# Patient Record
Sex: Female | Born: 1945
Health system: Southern US, Community
[De-identification: ages and names within clinical notes are randomized; demographics above are authoritative.]

## PROBLEM LIST (undated history)

## (undated) DIAGNOSIS — E785 Hyperlipidemia, unspecified: Secondary | ICD-10-CM

## (undated) DIAGNOSIS — E039 Hypothyroidism, unspecified: Secondary | ICD-10-CM

## (undated) DIAGNOSIS — I272 Pulmonary hypertension, unspecified: Secondary | ICD-10-CM

## (undated) DIAGNOSIS — R51 Headache: Secondary | ICD-10-CM

## (undated) DIAGNOSIS — I482 Chronic atrial fibrillation, unspecified: Secondary | ICD-10-CM

## (undated) DIAGNOSIS — R0600 Dyspnea, unspecified: Secondary | ICD-10-CM

## (undated) DIAGNOSIS — I10 Essential (primary) hypertension: Secondary | ICD-10-CM

## (undated) DIAGNOSIS — I499 Cardiac arrhythmia, unspecified: Secondary | ICD-10-CM

## (undated) DIAGNOSIS — R519 Headache, unspecified: Secondary | ICD-10-CM

## (undated) DIAGNOSIS — Z923 Personal history of irradiation: Secondary | ICD-10-CM

## (undated) DIAGNOSIS — C801 Malignant (primary) neoplasm, unspecified: Secondary | ICD-10-CM

## (undated) DIAGNOSIS — R252 Cramp and spasm: Secondary | ICD-10-CM

## (undated) DIAGNOSIS — R0609 Other forms of dyspnea: Secondary | ICD-10-CM

## (undated) DIAGNOSIS — Z9221 Personal history of antineoplastic chemotherapy: Secondary | ICD-10-CM

## (undated) DIAGNOSIS — E669 Obesity, unspecified: Secondary | ICD-10-CM

## (undated) DIAGNOSIS — I890 Lymphedema, not elsewhere classified: Secondary | ICD-10-CM

## (undated) DIAGNOSIS — N189 Chronic kidney disease, unspecified: Secondary | ICD-10-CM

## (undated) DIAGNOSIS — K219 Gastro-esophageal reflux disease without esophagitis: Secondary | ICD-10-CM

## (undated) DIAGNOSIS — M1990 Unspecified osteoarthritis, unspecified site: Secondary | ICD-10-CM

## (undated) HISTORY — DX: Hyperlipidemia, unspecified: E78.5

## (undated) HISTORY — PX: EYE SURGERY: SHX253

## (undated) HISTORY — DX: Dyspnea, unspecified: R06.00

## (undated) HISTORY — DX: Other forms of dyspnea: R06.09

## (undated) HISTORY — DX: Unspecified osteoarthritis, unspecified site: M19.90

## (undated) HISTORY — DX: Chronic atrial fibrillation, unspecified: I48.20

## (undated) HISTORY — DX: Lymphedema, not elsewhere classified: I89.0

## (undated) HISTORY — DX: Essential (primary) hypertension: I10

## (undated) HISTORY — PX: APPENDECTOMY: SHX54

## (undated) HISTORY — PX: COLONOSCOPY: SHX5424

## (undated) HISTORY — DX: Obesity, unspecified: E66.9

## (undated) HISTORY — DX: Pulmonary hypertension, unspecified: I27.20

---

## 2015-12-07 ENCOUNTER — Ambulatory Visit (INDEPENDENT_AMBULATORY_CARE_PROVIDER_SITE_OTHER): Payer: Medicare Other | Admitting: Sports Medicine

## 2015-12-07 ENCOUNTER — Encounter: Payer: Self-pay | Admitting: Sports Medicine

## 2015-12-07 ENCOUNTER — Ambulatory Visit: Payer: Medicare Other

## 2015-12-07 VITALS — BP 136/88 | HR 78 | Resp 17

## 2015-12-07 DIAGNOSIS — I739 Peripheral vascular disease, unspecified: Secondary | ICD-10-CM

## 2015-12-07 DIAGNOSIS — B351 Tinea unguium: Secondary | ICD-10-CM | POA: Diagnosis not present

## 2015-12-07 DIAGNOSIS — M79671 Pain in right foot: Secondary | ICD-10-CM | POA: Diagnosis not present

## 2015-12-07 DIAGNOSIS — M79672 Pain in left foot: Secondary | ICD-10-CM | POA: Diagnosis not present

## 2015-12-07 NOTE — Progress Notes (Signed)
Patient ID: Katelyn Jackson, female   DOB: [birth year], 70 y.o.   MRN: [id number] Subjective: Marianela Mirenda is a 70 y.o. female patient seen today in office with complaint of painful thickened and elongated toenails; unable to trim. States that sometimes the big toenails grow-in at the corners and get debris there that is hard to get out; reports that she has relocated from [facility] and was seeing a podiatrist there, Dr. Kirke Shaggy who was trimming her nails for her. Patient denies history of known Diabetes, Neuropathy, or Vascular disease. Admits to cardiovascular disease on warfarin and kidney disease which causes swelling in her legs. Patient has no other pedal complaints at this time.   Patient is assisted by Son at this visit.  There are no active problems to display for this patient.  No current outpatient prescriptions on file prior to visit.   No current facility-administered medications on file prior to visit.   Not on File   Objective: Physical Exam  General: Well developed, nourished, no acute distress, awake, alert and oriented x 3  Vascular: Dorsalis pedis artery 1/4 bilateral, Posterior tibial artery 0/4 bilateral, skin temperature warm to warm proximal to distal bilateral lower extremities, + mild varicosities and trace edema to both feet and legs, no pedal hair present bilateral.  Neurological: Gross sensation present via light touch bilateral. Protective intact with SWMF, Vibratory slightly diminished using tuning fork bilateral.  Dermatological: Skin is warm, dry, and supple bilateral, Nails 1-10 are tender, long, thick, and discolored with mild subungal debris, no webspace macerations present bilateral, no open lesions present bilateral, no callus/corns/hyperkeratotic tissue present bilateral. No signs of infection bilateral.  Musculoskeletal: Pes planus foot type noted bilateral. Muscular strength within normal limits without pain on range of motion. No pain with calf  compression bilateral.  Assessment and Plan:  Problem List Items Addressed This Visit    None    Visit Diagnoses    Foot pain, bilateral    -  Primary    Dermatophytosis of nail        PVD (peripheral vascular disease) ([place])          -Examined patient.  -Discussed treatment options for painful mycotic nails. -Mechanically debrided and reduced mycotic nails with sterile nail nipper and dremel nail file without incident. -Recommend elevation of lower legs when sitting to assist with edema control -Patient to return in 3 months for follow up evaluation or sooner if symptoms worsen.  Landis Martins, DPM

## 2016-03-07 ENCOUNTER — Encounter: Payer: Self-pay | Admitting: Sports Medicine

## 2016-03-07 ENCOUNTER — Ambulatory Visit (INDEPENDENT_AMBULATORY_CARE_PROVIDER_SITE_OTHER): Payer: Medicare HMO | Admitting: Sports Medicine

## 2016-03-07 DIAGNOSIS — M79671 Pain in right foot: Secondary | ICD-10-CM

## 2016-03-07 DIAGNOSIS — M79672 Pain in left foot: Secondary | ICD-10-CM

## 2016-03-07 DIAGNOSIS — B351 Tinea unguium: Secondary | ICD-10-CM

## 2016-03-07 DIAGNOSIS — I739 Peripheral vascular disease, unspecified: Secondary | ICD-10-CM

## 2016-03-07 NOTE — Patient Instructions (Signed)
Tea tree oil for nails  Son, to soak once daily with epsom salt and dress big toe with neosporin if does not improve make appointment to be seen

## 2016-03-07 NOTE — Progress Notes (Signed)
Patient ID: Katelyn Jackson, female   DOB: 1945/12/19, 70 y.o.   MRN: [id number]   Subjective: Katelyn Jackson is a 70 y.o. female patient seen today in office with complaint of painful thickened and elongated toenails; unable to trim. Denies any changes with medical history since last visit; Admits she had a right ankle sprain that is now better. Patient has no other pedal complaints at this time.   Patient is assisted by Son at this visit.  There are no active problems to display for this patient.  No current outpatient prescriptions on file prior to visit.   No current facility-administered medications on file prior to visit.   Not on File   Objective: Physical Exam  General: Well developed, nourished, no acute distress, awake, alert and oriented x 3  Vascular: Dorsalis pedis artery 1/4 bilateral, Posterior tibial artery 0/4 bilateral, skin temperature warm to warm proximal to distal bilateral lower extremities, + mild varicosities and +1 edema to both feet and legs, no pedal hair present bilateral.  Neurological: Gross sensation present via light touch bilateral. Protective intact with SWMF, Vibratory slightly diminished using tuning fork bilateral.  Dermatological: Skin is warm, dry, and supple bilateral, Nails 1-10 are tender, long, thick, and discolored with mild subungal debris, no webspace macerations present bilateral, no open lesions present bilateral, no callus/corns/hyperkeratotic tissue present bilateral. No signs of infection bilateral.  Musculoskeletal:No pain to right ankle. Pes planus foot type noted bilateral. Muscular strength within normal limits without pain on range of motion. No pain with calf compression bilateral.  Assessment and Plan:  Problem List Items Addressed This Visit    None    Visit Diagnoses    Foot pain, bilateral    -  Primary    Dermatophytosis of nail        PVD (peripheral vascular disease) ([facility])        Relevant Medications    furosemide (LASIX) 40 MG tablet    lovastatin (MEVACOR) 20 MG tablet    valsartan-hydrochlorothiazide (DIOVAN-HCT) 320-25 MG tablet    warfarin (COUMADIN) 5 MG tablet      -Examined patient.  -Discussed treatment options for painful mycotic nails. -Mechanically debrided and reduced mycotic nails with sterile nail nipper and dremel nail file without incident. -Recommend elevation of lower legs when sitting to assist with edema control -Recommend good supportive shoes and walking aids to prevent re-sprain  -Patient to return in 3 months for follow up evaluation or sooner if symptoms worsen.  Landis Martins, DPM

## 2016-03-15 ENCOUNTER — Ambulatory Visit: Payer: Self-pay | Admitting: Cardiovascular Disease

## 2016-03-17 ENCOUNTER — Ambulatory Visit: Payer: Self-pay | Admitting: Cardiovascular Disease

## 2016-03-29 ENCOUNTER — Ambulatory Visit: Payer: Self-pay | Admitting: Cardiovascular Disease

## 2016-03-30 ENCOUNTER — Telehealth: Payer: Self-pay | Admitting: Cardiovascular Disease

## 2016-03-30 NOTE — Telephone Encounter (Signed)
Received records from [facility] for appointment on 04/05/16 with Dr Gwenlyn Found.  Records given to [facility] (medical records) for Dr Kennon Holter schedule on 04/05/16. lp

## 2016-04-05 ENCOUNTER — Encounter: Payer: Self-pay | Admitting: Cardiovascular Disease

## 2016-04-05 ENCOUNTER — Ambulatory Visit (INDEPENDENT_AMBULATORY_CARE_PROVIDER_SITE_OTHER): Payer: Medicare HMO | Admitting: Cardiovascular Disease

## 2016-04-05 VITALS — BP 164/90 | HR 94 | Ht 61.0 in | Wt 267.2 lb

## 2016-04-05 DIAGNOSIS — E785 Hyperlipidemia, unspecified: Secondary | ICD-10-CM | POA: Diagnosis not present

## 2016-04-05 DIAGNOSIS — I890 Lymphedema, not elsewhere classified: Secondary | ICD-10-CM

## 2016-04-05 DIAGNOSIS — I272 Other secondary pulmonary hypertension: Secondary | ICD-10-CM | POA: Diagnosis not present

## 2016-04-05 DIAGNOSIS — I482 Chronic atrial fibrillation, unspecified: Secondary | ICD-10-CM | POA: Insufficient documentation

## 2016-04-05 DIAGNOSIS — I10 Essential (primary) hypertension: Secondary | ICD-10-CM | POA: Diagnosis not present

## 2016-04-05 NOTE — Assessment & Plan Note (Signed)
History of chronic A. Fib rate controlled on Coumadin anticoagulation. 

## 2016-04-05 NOTE — Assessment & Plan Note (Signed)
History of moderate pulmonary hypertension by 2-D echocardiogram performed in [facility] 05/14/15. Her RV systolic pressure was 50 mmHg.

## 2016-04-05 NOTE — Progress Notes (Signed)
04/05/2016 Katelyn Jackson   1946/03/31  [id number]  Primary Physician Sandi Mariscal, MD Primary Cardiologist: Lorretta Harp MD Renae Gloss   HPI:  Ms Katelyn Jackson is a 70 year old severely overweight widowed Caucasian female mother of one son ([name]) with no grandchildren referred by Dr. Nancy Fetter for cardiovascular evaluation and to be established in our practice.She has a history of treated hypertension and hyperlipidemia as well as chronic A. Fib on Coumadin anticoagulation. She's never had a heart attack or stroke. She does get some dyspnea on exertion which is chronic. She also has chronic lymphedema.   Current Outpatient Prescriptions  Medication Sig Dispense Refill  . furosemide (LASIX) 40 MG tablet     . KLOR-CON M20 20 MEQ tablet     . latanoprost (XALATAN) 0.005 % ophthalmic solution     . levothyroxine (SYNTHROID, LEVOTHROID) 50 MCG tablet     . lovastatin (MEVACOR) 20 MG tablet     . omeprazole (PRILOSEC) 40 MG capsule     . polyethylene glycol powder (GLYCOLAX/MIRALAX) powder     . valsartan-hydrochlorothiazide (DIOVAN-HCT) 320-25 MG tablet     . VOLTAREN 1 % GEL     . warfarin (COUMADIN) 5 MG tablet      No current facility-administered medications for this visit.    Allergies  Allergen Reactions  . Penicillins Itching    Social History   Social History  . Marital Status: Widowed    Spouse Name: N/A  . Number of Children: N/A  . Years of Education: N/A   Occupational History  . Not on file.   Social History Main Topics  . Smoking status: Unknown If Ever Smoked  . Smokeless tobacco: Never Used  . Alcohol Use: Not on file  . Drug Use: Not on file  . Sexual Activity: Not on file   Other Topics Concern  . Not on file   Social History Narrative     Review of Systems: General: negative for chills, fever, night sweats or weight changes.  Cardiovascular: negative for chest pain, dyspnea on exertion, edema, orthopnea, palpitations, paroxysmal  nocturnal dyspnea or shortness of breath Dermatological: negative for rash Respiratory: negative for cough or wheezing Urologic: negative for hematuria Abdominal: negative for nausea, vomiting, diarrhea, bright red blood per rectum, melena, or hematemesis Neurologic: negative for visual changes, syncope, or dizziness All other systems reviewed and are otherwise negative except as noted above.    Blood pressure 164/90, pulse 94, height 5\' 1"  (1.549 m), weight 267 lb 3.2 oz (121.201 kg).  General appearance: alert and no distress Neck: no adenopathy, no carotid bruit, no JVD, supple, symmetrical, trachea midline and thyroid not enlarged, symmetric, no tenderness/mass/nodules Lungs: clear to auscultation bilaterally Heart: irregularly irregular rhythm Extremities: o 3+ pitting edema bilaterally  EKG atrial fibrillation with a ventricular response of 94, nonspecific ST-T wave changes with left anterior fascicular block. There was incomplete left bundle branch block as well. I personally reviewed this EKG  ASSESSMENT AND PLAN:   Essential hypertension History of hypertensive blood pressure measures 164/90. She is on valsartan and hydrochlorothiazide. She says her blood pressure usually much better than this at home. Continue current meds at current dosing  Hyperlipidemia History of hyperlipidemia on statin therapy followed by her PCP  Chronic atrial fibrillation ([place]) History of chronic A. Fib rate controlled on Coumadin anticoagulation.  Pulmonary hypertension ([place]) History of moderate pulmonary hypertension by 2-D echocardiogram performed in [facility] 05/14/15. Her RV systolic pressure was 50 mmHg.  Lorretta Harp MD FACP,FACC,FAHA, [facility] 04/05/2016 4:39 PM

## 2016-04-05 NOTE — Patient Instructions (Signed)
Medication Instructions:  Your physician recommends that you continue on your current medications as directed. Please refer to the Current Medication list given to you today.   Labwork: NONE  Testing/Procedures: NONE  Follow-Up: Your physician wants you to follow-up in: 12 MONTHS WITH DR BERRY. You will receive a reminder letter in the mail two months in advance. If you don't receive a letter, please call our office to schedule the follow-up appointment.   Any Other Special Instructions Will Be Listed Below (If Applicable).     If you need a refill on your cardiac medications before your next appointment, please call your pharmacy.   

## 2016-04-05 NOTE — Assessment & Plan Note (Signed)
History of hypertensive blood pressure measures 164/90. She is on valsartan and hydrochlorothiazide. She says her blood pressure usually much better than this at home. Continue current meds at current dosing

## 2016-04-05 NOTE — Assessment & Plan Note (Addendum)
History of hyperlipidemia on statin therapy followed by her PCP. 

## 2016-06-13 ENCOUNTER — Ambulatory Visit (INDEPENDENT_AMBULATORY_CARE_PROVIDER_SITE_OTHER): Payer: Medicare HMO | Admitting: Sports Medicine

## 2016-06-13 ENCOUNTER — Encounter: Payer: Self-pay | Admitting: Sports Medicine

## 2016-06-13 DIAGNOSIS — M2142 Flat foot [pes planus] (acquired), left foot: Secondary | ICD-10-CM

## 2016-06-13 DIAGNOSIS — M79671 Pain in right foot: Secondary | ICD-10-CM

## 2016-06-13 DIAGNOSIS — M79672 Pain in left foot: Secondary | ICD-10-CM

## 2016-06-13 DIAGNOSIS — I739 Peripheral vascular disease, unspecified: Secondary | ICD-10-CM

## 2016-06-13 DIAGNOSIS — M2141 Flat foot [pes planus] (acquired), right foot: Secondary | ICD-10-CM

## 2016-06-13 DIAGNOSIS — B351 Tinea unguium: Secondary | ICD-10-CM

## 2016-06-13 NOTE — Progress Notes (Signed)
Patient ID: Katelyn Jackson, female   DOB: 1946/09/15, 70 y.o.   MRN: [id number]   Subjective: Katelyn Jackson is a 70 y.o. female patient seen today in office with complaint of painful thickened and elongated toenails; unable to trim. Admits to pain x 2 weeks at left arch with walking sometimes. Denies any other problems. Patient has no other pedal complaints at this time.   Patient is assisted by Son at this visit.  Patient Active Problem List   Diagnosis Date Noted  . Essential hypertension 04/05/2016  . Hyperlipidemia 04/05/2016  . Chronic atrial fibrillation ([place]) 04/05/2016  . Pulmonary hypertension ([place]) 04/05/2016  . Lymphedema 04/05/2016   Current Outpatient Prescriptions on File Prior to Visit  Medication Sig Dispense Refill  . furosemide (LASIX) 40 MG tablet     . KLOR-CON M20 20 MEQ tablet     . latanoprost (XALATAN) 0.005 % ophthalmic solution     . levothyroxine (SYNTHROID, LEVOTHROID) 50 MCG tablet     . lovastatin (MEVACOR) 20 MG tablet     . omeprazole (PRILOSEC) 40 MG capsule     . polyethylene glycol powder (GLYCOLAX/MIRALAX) powder     . valsartan-hydrochlorothiazide (DIOVAN-HCT) 320-25 MG tablet     . VOLTAREN 1 % GEL     . warfarin (COUMADIN) 5 MG tablet      No current facility-administered medications on file prior to visit.    Allergies  Allergen Reactions  . Penicillins Itching     Objective: Physical Exam  General: Well developed, nourished, no acute distress, awake, alert and oriented x 3  Vascular: Dorsalis pedis artery 1/4 bilateral, Posterior tibial artery 0/4 bilateral, skin temperature warm to warm proximal to distal bilateral lower extremities, + mild varicosities and +1 edema to both feet and legs, no pedal hair present bilateral.  Neurological: Gross sensation present via light touch bilateral. Protective intact with SWMF, Vibratory slightly diminished using tuning fork bilateral.  Dermatological: Skin is warm, dry, and supple  bilateral, Nails 1-10 are tender, long, thick, and discolored with mild subungal debris, no webspace macerations present bilateral, no open lesions present bilateral, no callus/corns/hyperkeratotic tissue present bilateral. No signs of infection bilateral.  Musculoskeletal:Subjective pain to left arch at plantar TN joint, no reproducible pain,. Pes planus foot type noted bilateral. Muscular strength within normal limits without pain on range of motion. No pain with calf compression bilateral.  Assessment and Plan:  Problem List Items Addressed This Visit    None    Visit Diagnoses    Dermatophytosis of nail    -  Primary   PVD (peripheral vascular disease) ([place])       Foot pain, bilateral       Pes planus of both feet         -Examined patient.  -Discussed treatment options for painful mycotic nails. -Mechanically debrided and reduced mycotic nails with sterile nail nipper and dremel nail file without incident. -Recommend elevation of lower legs when sitting to assist with edema control -Recommend good supportive shoes and cushion especially at arch; gave padding to use to left arch -Recommend icing as needed and if area continues to be bothersome at TN joint on left will consider steroid shot next time.  -Patient to return in 3 months for follow up evaluation or sooner if symptoms worsen.  Landis Martins, DPM

## 2016-09-13 ENCOUNTER — Ambulatory Visit (INDEPENDENT_AMBULATORY_CARE_PROVIDER_SITE_OTHER): Payer: Medicare HMO | Admitting: Podiatry

## 2016-09-13 ENCOUNTER — Encounter: Payer: Self-pay | Admitting: Podiatry

## 2016-09-13 VITALS — Ht 61.0 in | Wt 267.0 lb

## 2016-09-13 DIAGNOSIS — M79671 Pain in right foot: Secondary | ICD-10-CM | POA: Diagnosis not present

## 2016-09-13 DIAGNOSIS — M79672 Pain in left foot: Secondary | ICD-10-CM

## 2016-09-13 DIAGNOSIS — B351 Tinea unguium: Secondary | ICD-10-CM | POA: Diagnosis not present

## 2016-09-13 DIAGNOSIS — I739 Peripheral vascular disease, unspecified: Secondary | ICD-10-CM

## 2016-09-13 NOTE — Progress Notes (Signed)
Complaint:  Visit Type: Patient returns to my office for continued preventative foot care services. Complaint: Patient states" my nails have grown long and thick and become painful to walk and wear shoes"  The patient presents for preventative foot care services. No changes to ROS  Podiatric Exam: Vascular: dorsalis pedis and posterior tibial pulses are palpable bilateral. Capillary return is immediate. Temperature gradient is WNL. Skin turgor WNL  Sensorium: Normal Semmes Weinstein monofilament test. Normal tactile sensation bilaterally. Nail Exam: Pt has thick disfigured discolored nails with subungual debris noted bilateral entire nail hallux through fifth toenails Ulcer Exam: There is no evidence of ulcer or pre-ulcerative changes or infection. Orthopedic Exam: Muscle tone and strength are WNL. No limitations in general ROM. No crepitus or effusions noted. Foot type and digits show no abnormalities. Bony prominences are unremarkable. Pes planus Skin: No Porokeratosis. No infection or ulcers  Diagnosis:  Onychomycosis, , Pain in right toe, pain in left toes  Treatment & Plan Procedures and Treatment: Consent by patient was obtained for treatment procedures. The patient understood the discussion of treatment and procedures well. All questions were answered thoroughly reviewed. Debridement of mycotic and hypertrophic toenails, 1 through 5 bilateral and clearing of subungual debris. No ulceration, no infection noted.  Return Visit-Office Procedure: Patient instructed to return to the office for a follow up visit 3 months for continued evaluation and treatment.    Gardiner Barefoot DPM

## 2016-09-16 ENCOUNTER — Ambulatory Visit: Payer: Medicare HMO | Admitting: Podiatry

## 2016-11-07 HISTORY — PX: BREAST BIOPSY: SHX20

## 2016-11-07 HISTORY — PX: CATARACT EXTRACTION W/ INTRAOCULAR LENS  IMPLANT, BILATERAL: SHX1307

## 2016-11-07 HISTORY — PX: GLAUCOMA SURGERY: SHX656

## 2016-12-12 ENCOUNTER — Telehealth: Payer: Self-pay | Admitting: Cardiovascular Disease

## 2016-12-12 NOTE — Telephone Encounter (Signed)
New Message   Per pt son someone called last week to schedule stress test. Didn't see a order in system to schedule. Requesting a call back.

## 2016-12-12 NOTE — Telephone Encounter (Signed)
Returned call to son (ok per DPR) .  Son states he was called last week to schedule a stress test but was unsure why.  Per chart review, unable to locate any indication or order for stress test.  Son verbalized understanding.

## 2016-12-13 ENCOUNTER — Ambulatory Visit: Payer: Medicare HMO | Admitting: Podiatry

## 2016-12-20 ENCOUNTER — Encounter: Payer: Self-pay | Admitting: Podiatry

## 2016-12-20 ENCOUNTER — Ambulatory Visit (INDEPENDENT_AMBULATORY_CARE_PROVIDER_SITE_OTHER): Payer: Medicare HMO | Admitting: Podiatry

## 2016-12-20 VITALS — BP 101/72 | HR 79

## 2016-12-20 DIAGNOSIS — I739 Peripheral vascular disease, unspecified: Secondary | ICD-10-CM

## 2016-12-20 DIAGNOSIS — B351 Tinea unguium: Secondary | ICD-10-CM

## 2016-12-20 NOTE — Progress Notes (Signed)
Patient ID: Katelyn Jackson, female   DOB: 1946-09-21, 71 y.o.   MRN: [id number]   Subjective: This patient presents today with her son present in the treatment room requesting toenail debridement. Patient has history of the foot edema and mycotic toenails that have been treated in our office Patient's son present to treatment room  Objective: Patient is responsive to questioning Patient has immigrated from [place] and understands with assistance of her son English DP and PT pulses 1/4 bilaterally Capillary reflex immediate bilaterally Gross sensation to light touch intact bilaterally Vibratory sensation diminished bilaterally The toenails are elongated, brittle, discolored and tender to direct palpation 6-10 Pes planus bilaterally  Assessment Lymphedema bilaterally Pes planus bilaterally Symptomatic onychomycoses 6-10  Plan: Debridement toenails 6-10 mechanically and electronically without any bleeding  Reappoint 3 months

## 2017-01-09 ENCOUNTER — Other Ambulatory Visit: Payer: Self-pay | Admitting: Internal Medicine

## 2017-01-09 DIAGNOSIS — Z1231 Encounter for screening mammogram for malignant neoplasm of breast: Secondary | ICD-10-CM

## 2017-01-26 ENCOUNTER — Ambulatory Visit
Admission: RE | Admit: 2017-01-26 | Discharge: 2017-01-26 | Disposition: A | Discharge status: Home / Self Care | Payer: Medicare HMO | Source: Ambulatory Visit | Attending: Internal Medicine | Admitting: Internal Medicine

## 2017-01-26 DIAGNOSIS — Z1231 Encounter for screening mammogram for malignant neoplasm of breast: Secondary | ICD-10-CM

## 2017-01-31 ENCOUNTER — Other Ambulatory Visit: Payer: Self-pay | Admitting: Internal Medicine

## 2017-01-31 DIAGNOSIS — R928 Other abnormal and inconclusive findings on diagnostic imaging of breast: Secondary | ICD-10-CM

## 2017-02-02 ENCOUNTER — Ambulatory Visit
Admission: RE | Admit: 2017-02-02 | Discharge: 2017-02-02 | Disposition: A | Discharge status: Home / Self Care | Payer: Medicare HMO | Source: Ambulatory Visit | Attending: Internal Medicine | Admitting: Internal Medicine

## 2017-02-02 ENCOUNTER — Other Ambulatory Visit: Payer: Self-pay | Admitting: Internal Medicine

## 2017-02-02 DIAGNOSIS — R928 Other abnormal and inconclusive findings on diagnostic imaging of breast: Secondary | ICD-10-CM

## 2017-02-02 DIAGNOSIS — N6320 Unspecified lump in the left breast, unspecified quadrant: Secondary | ICD-10-CM

## 2017-02-06 ENCOUNTER — Other Ambulatory Visit: Payer: Self-pay | Admitting: Internal Medicine

## 2017-02-06 ENCOUNTER — Ambulatory Visit
Admission: RE | Admit: 2017-02-06 | Discharge: 2017-02-06 | Disposition: A | Discharge status: Home / Self Care | Payer: Medicare HMO | Source: Ambulatory Visit | Attending: Internal Medicine | Admitting: Internal Medicine

## 2017-02-06 DIAGNOSIS — N6320 Unspecified lump in the left breast, unspecified quadrant: Secondary | ICD-10-CM

## 2017-02-08 ENCOUNTER — Telehealth: Payer: Self-pay | Admitting: *Deleted

## 2017-02-08 NOTE — Telephone Encounter (Signed)
Confirmed [facility] for 02/15/17 at 0815 .  Instructions and contact information given.

## 2017-02-10 ENCOUNTER — Other Ambulatory Visit: Payer: Self-pay | Admitting: *Deleted

## 2017-02-10 ENCOUNTER — Encounter: Payer: Self-pay | Admitting: *Deleted

## 2017-02-10 DIAGNOSIS — C50412 Malignant neoplasm of upper-outer quadrant of left female breast: Secondary | ICD-10-CM

## 2017-02-10 DIAGNOSIS — Z170 Estrogen receptor positive status [ER+]: Secondary | ICD-10-CM

## 2017-02-15 ENCOUNTER — Other Ambulatory Visit: Payer: Self-pay | Admitting: *Deleted

## 2017-02-15 ENCOUNTER — Encounter: Payer: Self-pay | Admitting: Radiation Oncology

## 2017-02-15 ENCOUNTER — Other Ambulatory Visit (HOSPITAL_BASED_OUTPATIENT_CLINIC_OR_DEPARTMENT_OTHER): Payer: Medicare HMO

## 2017-02-15 ENCOUNTER — Ambulatory Visit (HOSPITAL_BASED_OUTPATIENT_CLINIC_OR_DEPARTMENT_OTHER): Payer: Medicare HMO | Admitting: Hematology

## 2017-02-15 ENCOUNTER — Ambulatory Visit: Payer: Medicare HMO | Attending: General Surgery | Admitting: Physical Therapy

## 2017-02-15 ENCOUNTER — Encounter: Payer: Self-pay | Admitting: *Deleted

## 2017-02-15 ENCOUNTER — Encounter: Payer: Self-pay | Admitting: Hematology

## 2017-02-15 ENCOUNTER — Ambulatory Visit
Admission: RE | Admit: 2017-02-15 | Discharge: 2017-02-15 | Disposition: A | Discharge status: Home / Self Care | Payer: Medicare HMO | Source: Ambulatory Visit | Attending: Radiation Oncology | Admitting: Radiation Oncology

## 2017-02-15 ENCOUNTER — Other Ambulatory Visit: Payer: Self-pay | Admitting: General Surgery

## 2017-02-15 ENCOUNTER — Encounter: Payer: Self-pay | Admitting: Physical Therapy

## 2017-02-15 VITALS — BP 153/64 | HR 89 | Temp 98.1°F | Resp 18 | Ht 61.0 in | Wt 280.3 lb

## 2017-02-15 DIAGNOSIS — I482 Chronic atrial fibrillation, unspecified: Secondary | ICD-10-CM

## 2017-02-15 DIAGNOSIS — D649 Anemia, unspecified: Secondary | ICD-10-CM

## 2017-02-15 DIAGNOSIS — C50412 Malignant neoplasm of upper-outer quadrant of left female breast: Secondary | ICD-10-CM

## 2017-02-15 DIAGNOSIS — D638 Anemia in other chronic diseases classified elsewhere: Secondary | ICD-10-CM

## 2017-02-15 DIAGNOSIS — Z170 Estrogen receptor positive status [ER+]: Principal | ICD-10-CM

## 2017-02-15 DIAGNOSIS — R293 Abnormal posture: Secondary | ICD-10-CM | POA: Diagnosis present

## 2017-02-15 DIAGNOSIS — I10 Essential (primary) hypertension: Secondary | ICD-10-CM

## 2017-02-15 LAB — CBC WITH DIFFERENTIAL/PLATELET
BASO%: 0.8 % (ref 0.0–2.0)
BASOS ABS: 0.1 10*3/uL (ref 0.0–0.1)
EOS ABS: 0.5 10*3/uL (ref 0.0–0.5)
EOS%: 7 % (ref 0.0–7.0)
HEMATOCRIT: 33.5 % — AB (ref 34.8–46.6)
HGB: 10.6 g/dL — ABNORMAL LOW (ref 11.6–15.9)
LYMPH%: 31.5 % (ref 14.0–49.7)
MCH: 28.1 pg (ref 25.1–34.0)
MCHC: 31.6 g/dL (ref 31.5–36.0)
MCV: 88.9 fL (ref 79.5–101.0)
MONO#: 0.7 10*3/uL (ref 0.1–0.9)
MONO%: 8.7 % (ref 0.0–14.0)
NEUT#: 3.9 10*3/uL (ref 1.5–6.5)
NEUT%: 52 % (ref 38.4–76.8)
NRBC: 0 % (ref 0–0)
Platelets: 216 10*3/uL (ref 145–400)
RBC: 3.77 10*6/uL (ref 3.70–5.45)
RDW: 14.4 % (ref 11.2–14.5)
WBC: 7.6 10*3/uL (ref 3.9–10.3)
lymph#: 2.4 10*3/uL (ref 0.9–3.3)

## 2017-02-15 LAB — COMPREHENSIVE METABOLIC PANEL
ALBUMIN: 3.5 g/dL (ref 3.5–5.0)
ALK PHOS: 92 U/L (ref 40–150)
ALT: 15 U/L (ref 0–55)
ANION GAP: 11 meq/L (ref 3–11)
AST: 16 U/L (ref 5–34)
BILIRUBIN TOTAL: 0.4 mg/dL (ref 0.20–1.20)
BUN: 46.4 mg/dL — ABNORMAL HIGH (ref 7.0–26.0)
CALCIUM: 9.5 mg/dL (ref 8.4–10.4)
CO2: 23 meq/L (ref 22–29)
Chloride: 106 mEq/L (ref 98–109)
Creatinine: 2 mg/dL — ABNORMAL HIGH (ref 0.6–1.1)
EGFR: 25 mL/min/{1.73_m2} — AB (ref >90)
Glucose: 106 mg/dl (ref 70–140)
Potassium: 3.8 mEq/L (ref 3.5–5.1)
Sodium: 140 mEq/L (ref 136–145)
TOTAL PROTEIN: 7.6 g/dL (ref 6.4–8.3)

## 2017-02-15 NOTE — Patient Instructions (Signed)

## 2017-02-15 NOTE — Progress Notes (Signed)
Clinical Social Work [place] Psychosocial Distress Screening [place]  Patient completed distress screening protocol and scored a 0 on the Psychosocial Distress Thermometer which indicates no distress. Clinical Social Worker met with patient and patients family in [facility] to assess for distress and other psychosocial needs. Patient stated she was feeling positive after meeting with the treatment team and getting more information on her treatment plan. CSW and patient discussed common feeling and emotions when being diagnosed with cancer, and the importance of support during treatment. CSW informed patient of the support team and support services at [facility]. CSW provided contact information and encouraged patient to call with any questions or concerns.   ONCBCN DISTRESS SCREENING 02/15/2017  Screening Type Initial Screening  Distress experienced in past week (1-10) 0    Johnnye Lana, MSW, LCSW, OSW-C Clinical Social Worker [facility] [phone]

## 2017-02-15 NOTE — Progress Notes (Signed)
Radiation Oncology         [phone] ________________________________  Name: Katelyn Jackson MRN: [id number]  Date: 02/15/2017  DOB: Sep 01, 1946  CC:Sandi Mariscal, MD  [name], MD     REFERRING PHYSICIAN: [name], MD   DIAGNOSIS: The encounter diagnosis was Malignant neoplasm of upper-outer quadrant of left breast in female, estrogen receptor positive ([place]).   HISTORY OF PRESENT ILLNESS: Katelyn Jackson is a 71 y.o. female seen in the multidisciplinary breast clinic for a new diagnosis of left breast cancer. The patient reports a history of normal mammography and went for routine screening which detected an architectural assymmetry. She underwent diagnostic imaging and an ultrasound revealed a 4 x 4 x 3 mm lesion at 3 o'clock, and a 6 x4 x3 mm at 1 o'clock, both of which were biopsied. There was also another lesion seen at 2:30 position which did not have a measurement documented. She did not have any adenopathy noted in the axilla on the left. She had a biopsy of the 1 and 3 o'clock lesions on 02/06/17, and both biopsies revealed grade 1 invasive ductal carcinoma and DCIS, ER/PR positive, HER2 negative with Ki 67 of 10%. She comes today to discuss the options for treatment of her cancer.    PREVIOUS RADIATION THERAPY: No   PAST MEDICAL HISTORY:  Past Medical History:  Diagnosis Date  . Arthritis   . Chronic atrial fibrillation ([place])   . Dyspnea on exertion   . Hyperlipidemia   . Hypertension   . Lymphedema   . Moderate to severe pulmonary hypertension   . Obesity        PAST SURGICAL HISTORY: Past Surgical History:  Procedure Laterality Date  . APPENDECTOMY    . bilateral eye surgery       FAMILY HISTORY:  Family History  Problem Relation Age of Onset  . Cancer Neg Hx      SOCIAL HISTORY:  has an unknown smoking status. She has never used smokeless tobacco. The patient is widowed and originally from [place], and she is of [name] descent. She's lived  in the [place] since the last 31s. 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 She enjoys cooking and playing with her terrier.    ALLERGIES: Penicillins   MEDICATIONS:  Current Outpatient Prescriptions  Medication Sig Dispense Refill  . furosemide (LASIX) 40 MG tablet     . KLOR-CON M20 20 MEQ tablet     . latanoprost (XALATAN) 0.005 % ophthalmic solution     . levothyroxine (SYNTHROID, LEVOTHROID) 50 MCG tablet     . lovastatin (MEVACOR) 20 MG tablet     . omeprazole (PRILOSEC) 40 MG capsule     . polyethylene glycol powder (GLYCOLAX/MIRALAX) powder     . valsartan-hydrochlorothiazide (DIOVAN-HCT) 320-25 MG tablet     . VOLTAREN 1 % GEL     . warfarin (COUMADIN) 5 MG tablet      No current facility-administered medications for this encounter.      REVIEW OF SYSTEMS: On review of systems, the patient reports that she is doing well overall. She denies any chest pain, shortness of breath, cough, fevers, chills, night sweats, unintended weight changes. She denies any bowel or bladder disturbances, and denies abdominal pain, nausea or vomiting. She denies any new musculoskeletal or joint aches or pains. A complete review of systems is obtained and is otherwise negative.     PHYSICAL EXAM:  Wt Readings from Last 3 Encounters:  02/15/17 280 lb  4.8 oz (127.1 kg)  09/13/16 267 lb (121.1 kg)  04/05/16 267 lb 3.2 oz (121.2 kg)   Temp Readings from Last 3 Encounters:  02/15/17 98.1 F (36.7 C) (Oral)   BP Readings from Last 3 Encounters:  02/15/17 (!) 153/64  12/20/16 101/72  04/05/16 (!) 164/90   Pulse Readings from Last 3 Encounters:  02/15/17 89  12/20/16 79  04/05/16 94      In general this is a well appearing caucasian female in no acute distress. She is alert and oriented x4 and appropriate throughout the examination. HEENT reveals that the patient is normocephalic, atraumatic. EOMs are intact. PERRLA. Skin is intact without any evidence of gross lesions.  Cardiovascular exam reveals a regular rate and rhythm, no clicks rubs or murmurs are auscultated. Chest is clear to auscultation bilaterally. Lymphatic assessment is performed and does not reveal any adenopathy in the cervical, supraclavicular, axillary, or inguinal chains.Bilateral breast exam is performed and reveals normal breast tissue of the right breast without palpable abnormality. The left breast is examined as well and reveals post biopsy changes consistent with mild ecchymosis and induration deep to the biopsy site. No palpable masses are noted, and no nipple bleeding or discharge is noted of either breast.  Abdomen has active bowel sounds in all quadrants and is intact. The abdomen is soft, non tender, non distended. Lower extremities are negative for pretibial pitting edema, deep calf tenderness, cyanosis or clubbing.   ECOG = 0  0 - Asymptomatic (Fully active, able to carry on all predisease activities without restriction)  1 - Symptomatic but completely ambulatory (Restricted in physically strenuous activity but ambulatory and able to carry out work of a light or sedentary nature. For example, light housework, office work)  2 - Symptomatic, <50% in bed during the day (Ambulatory and capable of all self care but unable to carry out any work activities. Up and about more than 50% of waking hours)  3 - Symptomatic, >50% in bed, but not bedbound (Capable of only limited self-care, confined to bed or chair 50% or more of waking hours)  4 - Bedbound (Completely disabled. Cannot carry on any self-care. Totally confined to bed or chair)  5 - Death   Eustace Pen MM, Creech RH, Tormey DC, et al. [id number]). "Toxicity and response criteria of the [facility]". [place] Oncol. 5 (6): 649-55    LABORATORY DATA:  Lab Results  Component Value Date   WBC 7.6 02/15/2017   HGB 10.6 (L) 02/15/2017   HCT 33.5 (L) 02/15/2017   MCV 88.9 02/15/2017   PLT 216 02/15/2017   Lab  Results  Component Value Date   NA 140 02/15/2017   K 3.8 02/15/2017   CO2 23 02/15/2017   Lab Results  Component Value Date   ALT 15 02/15/2017   AST 16 02/15/2017   ALKPHOS 92 02/15/2017   BILITOT 0.40 02/15/2017      RADIOGRAPHY: Mm Digital Screening Bilateral  Result Date: 01/30/2017 CLINICAL DATA:  Screening. EXAM: DIGITAL SCREENING BILATERAL MAMMOGRAM WITH CAD COMPARISON:  Previous examinations [place] could not be obtained. ACR Breast Density Category b: There are scattered areas of fibroglandular density. FINDINGS: In the left breast, possible asymmetries warrant further evaluation. In the right breast, no findings suspicious for malignancy. Images were processed with CAD. IMPRESSION: Further evaluation is suggested for possible asymmetries in the left breast. RECOMMENDATION: Diagnostic mammogram and possibly ultrasound of the left breast. (Code:[id number]-L-47M) The patient will be contacted regarding the  findings, and additional imaging will be scheduled. BI-RADS CATEGORY  0: Incomplete. Need additional imaging evaluation and/or prior mammograms for comparison. Electronically Signed   By: Claudie Revering M.D.   On: 01/30/2017 09:56   US Breast Ltd Uni Left Inc Axilla  Result Date: 02/02/2017 CLINICAL DATA:  Patient was called back from screening mammogram for masses in the left breast. EXAM: 2D DIGITAL DIAGNOSTIC LEFT MAMMOGRAM WITH CAD AND ADJUNCT TOMO ULTRASOUND LEFT BREAST COMPARISON:  Screening mammogram dated 01/26/2017. ACR Breast Density Category b: There are scattered areas of fibroglandular density. FINDINGS: Additional imaging of the left breast was performed. There is a 1.1 cm ovoid mass in the upper-inner quadrant of the left breast. There are several subcentimeter masses in the lateral aspect of the left breast. There are no malignant type microcalcifications. Mammographic images were processed with CAD. On physical exam, I do not palpate a mass in the left breast. Targeted  ultrasound is performed, showing: 1. An irregular hypoechoic mass in the left breast at 1 o'clock 7 cm from the nipple measuring 6 x 3 x 4 mm. 2. There is an irregular hypoechoic mass in left breast at 3 o'clock 7 cm from the nipple measuring 4 x 4 x 3 mm. 3. There is a subtle hypoechoic mass in the left breast at 2:30 5 cm from the nipple measuring 3 x 3 x 3 mm. 4. There is a cluster of near anechoic cysts in the left breast at 11 o'clock 7 cm from the nipple measuring 8 x 5 x 10 mm. 5. There are benign appearing cysts in the 12 o'clock retroareolar region of the left breast measuring 4 and 5 mm. 6. There is benign cyst in the left breast at 2 o'clock 7 cm from the nipple measuring 4 x 2 x 4 mm. 7. There is a cluster of near anechoic cysts in the left breast at 2:30 6 cm from the nipple measuring 2 x 2 x 3 mm. 8. Sonographic evaluation the left axilla does not show any enlarged adenopathy. IMPRESSION: Suspicious hypoechoic masses in the left breast at 1 o'clock 7 cm from the nipple and at 3 o'clock 7 cm from the nipple. RECOMMENDATION: Ultrasound-guided core biopsies of the masses in the left breast at 1 o'clock 7 cm from the nipple and 3 o'clock 7 cm from the nipple is recommended. If these masses are positive for malignancy ultrasound-guided core biopsy of the mass at 2:30 5 cm from the nipple would be recommended. If these lesions are benign short-term interval follow-up mammogram and ultrasound in 6 months would be recommended. The ultrasound-guided core biopsies will be scheduled at the patient's convenience. I have discussed the findings and recommendations with the patient. Results were also provided in writing at the conclusion of the visit. If applicable, a reminder letter will be sent to the patient regarding the next appointment. BI-RADS CATEGORY  4: Suspicious. Electronically Signed   By: Lillia Mountain M.D.   On: 02/02/2017 09:31   Mm Diag Breast Tomo Uni Left  Result Date: 02/02/2017 CLINICAL DATA:   Patient was called back from screening mammogram for masses in the left breast. EXAM: 2D DIGITAL DIAGNOSTIC LEFT MAMMOGRAM WITH CAD AND ADJUNCT TOMO ULTRASOUND LEFT BREAST COMPARISON:  Screening mammogram dated 01/26/2017. ACR Breast Density Category b: There are scattered areas of fibroglandular density. FINDINGS: Additional imaging of the left breast was performed. There is a 1.1 cm ovoid mass in the upper-inner quadrant of the left breast. There are several subcentimeter  masses in the lateral aspect of the left breast. There are no malignant type microcalcifications. Mammographic images were processed with CAD. On physical exam, I do not palpate a mass in the left breast. Targeted ultrasound is performed, showing: 1. An irregular hypoechoic mass in the left breast at 1 o'clock 7 cm from the nipple measuring 6 x 3 x 4 mm. 2. There is an irregular hypoechoic mass in left breast at 3 o'clock 7 cm from the nipple measuring 4 x 4 x 3 mm. 3. There is a subtle hypoechoic mass in the left breast at 2:30 5 cm from the nipple measuring 3 x 3 x 3 mm. 4. There is a cluster of near anechoic cysts in the left breast at 11 o'clock 7 cm from the nipple measuring 8 x 5 x 10 mm. 5. There are benign appearing cysts in the 12 o'clock retroareolar region of the left breast measuring 4 and 5 mm. 6. There is benign cyst in the left breast at 2 o'clock 7 cm from the nipple measuring 4 x 2 x 4 mm. 7. There is a cluster of near anechoic cysts in the left breast at 2:30 6 cm from the nipple measuring 2 x 2 x 3 mm. 8. Sonographic evaluation the left axilla does not show any enlarged adenopathy. IMPRESSION: Suspicious hypoechoic masses in the left breast at 1 o'clock 7 cm from the nipple and at 3 o'clock 7 cm from the nipple. RECOMMENDATION: Ultrasound-guided core biopsies of the masses in the left breast at 1 o'clock 7 cm from the nipple and 3 o'clock 7 cm from the nipple is recommended. If these masses are positive for malignancy  ultrasound-guided core biopsy of the mass at 2:30 5 cm from the nipple would be recommended. If these lesions are benign short-term interval follow-up mammogram and ultrasound in 6 months would be recommended. The ultrasound-guided core biopsies will be scheduled at the patient's convenience. I have discussed the findings and recommendations with the patient. Results were also provided in writing at the conclusion of the visit. If applicable, a reminder letter will be sent to the patient regarding the next appointment. BI-RADS CATEGORY  4: Suspicious. Electronically Signed   By: Lillia Mountain M.D.   On: 02/02/2017 09:31   Mm Clip Placement Left  Result Date: 02/06/2017 CLINICAL DATA:  70 year old female status post ultrasound-guided biopsies of left breast masses at 1 o'clock, 7 cm from the nipple and 3 o'clock, 7 cm from the nipple EXAM: DIAGNOSTIC LEFT MAMMOGRAM POST ULTRASOUND BIOPSY COMPARISON:  Previous exam(s). FINDINGS: Mammographic images were obtained following ultrasound-guided biopsies of left breast masses at 1 o'clock, 7 cm from the nipple and 3 o'clock, 7 cm from the nipple. Post biopsy mammogram demonstrates the ribbon shaped biopsy marker to be in the expected location of the left breast mass at 1 o'clock and the coil shaped biopsy marker to be in the expected location of the left breast mass at 3 o'clock. IMPRESSION: Appropriate marker position as above. Final Assessment: Post Procedure Mammograms for Marker Placement Electronically Signed   By: Pamelia Hoit M.D.   On: 02/06/2017 16:00   [place] Lt Breast Bx W Loc Dev 1st Lesion Img Bx Spec US Guide  Addendum Date: 02/08/2017   ADDENDUM REPORT: 02/08/2017 14:32 ADDENDUM: Pathology revealed GRADE I INVASIVE DUCTAL CARCINOMA, DUCTAL CARCINOMA IN SITU of the Left breast, both locations, 1:00 o'clock 7 CMFN and 3:00 o'clock 7 CMFN. This was found to be concordant by Dr. Pamelia Hoit. Pathology  results were discussed with the patient's son, Macky Lower by  telephone, per patient request. He reported his mother did well after the biopsies with tenderness at the sites. Post biopsy instructions and care were reviewed and questions were answered. Mr. Irena Reichmann was encouraged to call [facility] for any additional concerns. The patient was referred to [facility] on February 15, 2017. If breast conservation is being considered, recommend consideration of preoperative MRI for further evaluation of extent of disease. If a preoperative breast MRI is not planned, recommend biopsy of the left breast mass at 2:30, 5 CMFN for further evaluation of extent of disease. Pathology results reported by Terie Purser, RN on 02/08/2017. Electronically Signed   By: Pamelia Hoit M.D.   On: 02/08/2017 14:32   Result Date: 02/08/2017 CLINICAL DATA:  71 year old female for ultrasound-guided biopsy of a suspicious left breast mass at 1 o'clock, 7 cm from the nipple EXAM: ULTRASOUND GUIDED LEFT BREAST CORE NEEDLE BIOPSY COMPARISON:  Previous exam(s). FINDINGS: I met with the patient and we discussed the procedure of ultrasound-guided biopsy, including benefits and alternatives. We discussed the high likelihood of a successful procedure. We discussed the risks of the procedure, including infection, bleeding, tissue injury, clip migration, and inadequate sampling. Informed written consent was given. The usual time-out protocol was performed immediately prior to the procedure. Using sterile technique and 1% Lidocaine as local anesthetic, under direct ultrasound visualization, a 12 gauge spring-loaded device was used to perform biopsy of a suspicious left breast mass at 1 o'clock, 7 cm from the nipple using a lateral to medial approach. At the conclusion of the procedure a ribbon shaped tissue marker clip was deployed into the biopsy cavity. Follow up 2 view mammogram was performed and dictated separately.  IMPRESSION: Ultrasound guided biopsy of an indeterminate left breast mass at 1 o'clock, 7 cm from the nipple. No apparent complications. Electronically Signed: By: Pamelia Hoit M.D. On: 02/06/2017 16:02   [place] Lt Breast Bx W Loc Dev Ea Add Lesion Img Bx Spec US Guide  Addendum Date: 02/08/2017   ADDENDUM REPORT: 02/08/2017 14:32 ADDENDUM: Pathology revealed GRADE I INVASIVE DUCTAL CARCINOMA, DUCTAL CARCINOMA IN SITU of the Left breast, both locations, 1:00 o'clock 7 CMFN and 3:00 o'clock 7 CMFN. This was found to be concordant by Dr. Pamelia Hoit. Pathology results were discussed with the patient's son, Macky Lower by telephone, per patient request. He reported his mother did well after the biopsies with tenderness at the sites. Post biopsy instructions and care were reviewed and questions were answered. Mr. Irena Reichmann was encouraged to call [facility] for any additional concerns. The patient was referred to [facility] on February 15, 2017. If breast conservation is being considered, recommend consideration of preoperative MRI for further evaluation of extent of disease. If a preoperative breast MRI is not planned, recommend biopsy of the left breast mass at 2:30, 5 CMFN for further evaluation of extent of disease. Pathology results reported by Terie Purser, RN on 02/08/2017. Electronically Signed   By: Pamelia Hoit M.D.   On: 02/08/2017 14:32   Result Date: 02/08/2017 CLINICAL DATA:  71 year old female for ultrasound-guided biopsy of a suspicious left breast mass at 3 o'clock, 7 cm from the nipple EXAM: ULTRASOUND GUIDED LEFT BREAST CORE NEEDLE BIOPSY COMPARISON:  Previous exam(s). FINDINGS: I met with the patient and  we discussed the procedure of ultrasound-guided biopsy, including benefits and alternatives. We discussed the high likelihood of a successful procedure. We discussed the risks of the procedure, including  infection, bleeding, tissue injury, clip migration, and inadequate sampling. Informed written consent was given. The usual time-out protocol was performed immediately prior to the procedure. Using sterile technique and 1% Lidocaine as local anesthetic, under direct ultrasound visualization, a 12 gauge spring-loaded device was used to perform biopsy of a suspicious left breast mass at 3 o'clock, 7 cm from the nipple using a lateral to medial approach. At the conclusion of the procedure a coil shaped tissue marker clip was deployed into the biopsy cavity. Follow up 2 view mammogram was performed and dictated separately. IMPRESSION: Ultrasound guided biopsy of a suspicious left breast mass at 3 o'clock, 7 cm from nipple. No apparent complications. Electronically Signed: By: Pamelia Hoit M.D. On: 02/06/2017 16:03       IMPRESSION/PLAN: 1. Stage IA, [id number] grade 1, ER/PR positive  invasive ductal carcinoma of the left breast. Dr. Lisbeth Renshaw discusses the pathology findings and reviews the nature of invasive and in situ disease. The consensus from the breast conference included moving forward with an MRI of the breast to understand the extent of disease. Provided that she was a candidate, we would anticpate a  breast conservation surgery, with lumpectomy/partial mastectomy with sentinel mapping. If her tumor was 1 cm or larger, oncotype testing would be performed on the sp Provided that chemotherapy is not indicated, the patient's course would then be followed by external radiotherapy to the breast followed by antiestrogen therapy. We discussed the risks, benefits, short, and long term effects of radiotherapy, and the patient is interested in proceeding. Dr. Lisbeth Renshaw discusses the delivery and logistics of radiotherapy. Dr. Lisbeth Renshaw would recommend a course of 4 weeks of therapy with deep inspiration breath hold technique. We will see her back about 2 weeks after surgery to move forward with the simulation and planning process  and anticipate starting radiotherapy about 4 weeks after surgery.    The above documentation reflects my direct findings during this shared patient visit. Please see the separate note by Dr. Lisbeth Renshaw on this date for the remainder of the patient's plan of care.    Carola Rhine, PAC

## 2017-02-15 NOTE — Therapy (Signed)
[place], [place], [address] Phone: [phone]   Fax:  [phone]  Physical Therapy Evaluation  Patient Details  Name: Katelyn Jackson MRN: [id number] Date of Birth: March 04, 1946 Referring Provider: Dr. [name]  Encounter Date: 02/15/2017      PT End of Session - 02/15/17 0951    Visit Number 1   Number of Visits 1   PT Start Time 1003   PT Stop Time 1027   PT Time Calculation (min) 24 min   Activity Tolerance Patient tolerated treatment well   Behavior During Therapy [facility] for tasks assessed/performed      Past Medical History:  Diagnosis Date  . Arthritis   . Chronic atrial fibrillation ([place])   . Dyspnea on exertion   . Hyperlipidemia   . Hypertension   . Lymphedema   . Moderate to severe pulmonary hypertension   . Obesity     Past Surgical History:  Procedure Laterality Date  . APPENDECTOMY    . bilateral eye surgery      There were no vitals filed for this visit.       Subjective Assessment - 02/15/17 0943    Subjective Patient reports she is here today to be seen by her medical team for her newly diagnosed left breast cancer.   Patient is accompained by: Family member   Pertinent History Patient was diagnosed on 01/26/17 with left grade 1 invasive ductal carcinoma breast cancer. It is ER/PR positive and HER2 negative with a Ki67 of 10%. There are 3 areas of concern all in the upper outer quadrant. One measures 6 mm, another measures 4 mm, and the other area at 2:30 has not been biopsied.   Patient Stated Goals Reduce lymphedema risk and learn post op shoulder ROM HEP.   Currently in Pain? No/denies            [facility] PT Assessment - 02/15/17 0001      Assessment   Medical Diagnosis Left breast cancer   Referring Provider Dr. [name]   Onset Date/Surgical Date 01/26/17   Hand Dominance Right   Prior Therapy none     Precautions   Precautions Other (comment)    Precaution Comments active breast cancer     Restrictions   Weight Bearing Restrictions No     Balance Screen   Has the patient fallen in the past 6 months No   Has the patient had a decrease in activity level because of a fear of falling?  No   Is the patient reluctant to leave their home because of a fear of falling?  No     Home Ec[employer] residence   Living Arrangements Children  Son and daughter-in-law   Available Help at Discharge Family     Prior Function   Level of [place] Retired   Leisure She does not exercise     Cognition   Overall Cognitive Status Within Functional Limits for tasks assessed     Posture/Postural Control   Posture/Postural Control Postural limitations   Postural Limitations Rounded Shoulders;Forward head   Posture Comments Morbid obesity     ROM / Strength   AROM / PROM / Strength AROM;Strength     AROM   AROM Assessment Site Shoulder;Cervical   Right/Left Shoulder Right;Left   Right Shoulder Extension 44 Degrees   Right Shoulder Flexion 125 Degrees   Right Shoulder ABduction 145 Degrees   Right  Shoulder Internal Rotation 57 Degrees   Right Shoulder External Rotation 67 Degrees   Left Shoulder Extension 43 Degrees   Left Shoulder Flexion 132 Degrees   Left Shoulder ABduction 133 Degrees   Left Shoulder Internal Rotation 59 Degrees   Left Shoulder External Rotation 71 Degrees   Cervical Flexion WNL   Cervical Extension WNL   Cervical - Right Side Bend WNL   Cervical - Left Side Bend WNL   Cervical - Right Rotation 25% limited   Cervical - Left Rotation 25% limited     Strength   Overall Strength Within functional limits for tasks performed           LYMPHEDEMA/ONCOLOGY QUESTIONNAIRE - 02/15/17 0950      Type   Cancer Type Left breast cancer     Lymphedema Assessments   Lymphedema Assessments Upper extremities     Right Upper Extremity Lymphedema   10 cm Proximal  to Olecranon Process 45.1 cm   Olecranon Process 36.8 cm   10 cm Proximal to Ulnar Styloid Process 34.3 cm   Just Proximal to Ulnar Styloid Process 23.1 cm   Across Hand at [facility] 21.7 cm   At [place] of 2nd Digit 7.9 cm     Left Upper Extremity Lymphedema   10 cm Proximal to Olecranon Process 46.3 cm   Olecranon Process 34.3 cm   10 cm Proximal to Ulnar Styloid Process 34 cm   Just Proximal to Ulnar Styloid Process 25.3 cm   Across Hand at [facility] 21.8 cm   At [place] of 2nd Digit 7.8 cm       Patient was instructed today in a home exercise program today for post op shoulder range of motion. These included active assist shoulder flexion in sitting, scapular retraction, wall walking with shoulder abduction, and hands behind head external rotation.  She was encouraged to do these twice a day, holding 3 seconds and repeating 5 times when permitted by her physician.         PT Education - 02/15/17 0950    Education provided Yes   Education Details Lymphedema risk reduction and post op shoulder ROM HEP   Person(s) Educated Patient;Child(ren)   Methods Explanation;Demonstration;Handout   Comprehension Returned demonstration;Verbalized understanding              Breast Clinic Goals - 02/15/17 0955      Patient will be able to verbalize understanding of pertinent lymphedema risk reduction practices relevant to her diagnosis specifically related to skin care.   Time 1   Period Days   Status Achieved     Patient will be able to return demonstrate and/or verbalize understanding of the post-op home exercise program related to regaining shoulder range of motion.   Time 1   Period Days   Status Achieved     Patient will be able to verbalize understanding of the importance of attending the postoperative After Breast Cancer Class for further lymphedema risk reduction education and therapeutic exercise.   Time 1   Period Days   Status Achieved               Plan - 02/15/17 0951    Clinical Impression Statement Patient was diagnosed on 01/26/17 with left grade 1 invasive ductal carcinoma breast cancer. It is ER/PR positive and HER2 negative with a Ki67 of 10%. There are 3 areas of concern all in the upper outer quadrant. One measures 6 mm, another measures 4 mm, and  the other area at 2:30 has not been biopsied. The multidisciplinary medical team met prior to her assessments to determine a recommended treatment plan. She is planning to have an MRI likely followed by a lumpectomy and sentinel node biopsy, Oncotype testing, radiation, and anti-estrogen therapy. She may benefit from post op PT to regain shoulder ROM and reduce lymphedema risk. Due to her lack of comorbidities, her eval is of low complexity.   Rehab Potential Excellent   Clinical Impairments Affecting Rehab Potential None   PT Frequency One time visit   PT Treatment/Interventions Therapeutic exercise;Patient/family education   PT Next Visit Plan Will f/u after surgery to determine PT needs   PT Home Exercise Plan Post op shoulder ROM HEP   Consulted and Agree with Plan of Care Patient;Family member/caregiver   Family Member Consulted son      Patient will benefit from skilled therapeutic intervention in order to improve the following deficits and impairments:  Postural dysfunction, Decreased knowledge of precautions, Impaired UE functional use, Decreased range of motion  Visit Diagnosis: Carcinoma of upper-outer quadrant of left breast in female, estrogen receptor positive ([place]) - Plan: PT plan of care cert/re-cert  Abnormal posture - Plan: PT plan of care cert/re-cert  Patient will follow up at outpatient cancer rehab if needed following surgery.  If the patient requires physical therapy at that time, a specific plan will be dictated and sent to the referring physician for approval. The patient was educated today on appropriate basic range of motion exercises to begin post operatively  and the importance of attending the After Breast Cancer class following surgery.  Patient was educated today on lymphedema risk reduction practices as it pertains to recommendations that will benefit the patient immediately following surgery.  She verbalized good understanding.  No additional physical therapy is indicated at this time.        G-Codes - 03/08/17 0955    Functional Assessment Tool Used (Outpatient Only) Clinical Judgement   Functional Limitation Other PT primary   Other PT Primary Current Status (W1093) At least 1 percent but less than 20 percent impaired, limited or restricted   Other PT Primary Goal Status (A3557) At least 1 percent but less than 20 percent impaired, limited or restricted       Problem List Patient Active Problem List   Diagnosis Date Noted  . Malignant neoplasm of upper-outer quadrant of left breast in female, estrogen receptor positive ([place]) 02/10/2017  . Essential hypertension 04/05/2016  . Hyperlipidemia 04/05/2016  . Chronic atrial fibrillation ([place]) 04/05/2016  . Pulmonary hypertension 04/05/2016  . Lymphedema 04/05/2016    Annia Friendly, PT 08-Mar-2017 11:14 AM  [place], [place], [address] Phone: [phone]   Fax:  [phone]  Name: Katelyn Jackson MRN: [id number] Date of Birth: 11-17-1945

## 2017-02-15 NOTE — Progress Notes (Signed)
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 Follow up 2 view mammogram was performed and dictated separately. IMPRESSION: Ultrasound guided biopsy of a suspicious left breast mass at 3 o'clock, 7 cm from nipple. No apparent complications. Electronically Signed: By: Pamelia Hoit M.D. On: 02/06/2017 16:03    ASSESSMENT & PLAN: 71 y.o. Caucasian woman with screening detected left breast cancer  1.  Malignant neoplasm of upper-outer quadrant of left breast, invasive ductal carcinoma,  stage IA (cT1bN0), grade 1, ER+,PR+,HER2- -We discussed the patients imaging and biopsy pathology in great detail. -Diagnostic mammogram and [place] on 02/02/17 showed multiple (3) masses and benign appearing cysts in the left breast. 2 biopsy showed similar appearing greater 1 invasive ductal carcinoma. -Due to the multifocal disease, we recommend bilateral breast MRI with and without contrast for further evaluation the extent of disease. -Giving the multiple masses, surgery will be determined depending on the results of  her MRI. She was seen by Dr. Barry Dienes today. If MRI shows no additional lesions, she may still be a candidate for quadrantectomy and sentinel lymph node biopsy. -I will recommend a Oncotype Dx test on the surgical sample if the tumor measures 1 cm or greater. We'll make a decision about adjuvant chemotherapy based on the Oncotype result. Written material of this test was given to her. Given her low grade ER+/PR+ and HER2- disease, I anticipate she probably has low risk disease -If her surgical sentinel lymph node node positive, I recommend mammaprint for further risk stratification and guide adjuvant chemotherapy. -Giving the strong ER and PR expression in her postmenopausal status, I recommend adjuvant endocrine therapy with aromatase inhibitor for a total of 5-10 years to reduce the risk of cancer recurrence. Potential benefits and side effects were discussed with patient and she is interested. -She was also seen by radiation oncologist Dr. Lisbeth Renshaw today. If she undergo lumpectomy, she would benefit from adjuvant breast radiation. -We also discussed the breast cancer surveillance after her surgery. She will continue annual screening mammogram, self exam, and a routine office visit with lab and exam with [place]. -I encouraged her to have healthy diet and exercise regularly.  2. Chronic atrial fibrillation, hyperlipidemia, HTN, lymphedema, pulmonary hypertension, obesity, hypothyroidism -Managed by her PCP and Cardiologist (Dr. Gwenlyn Found) -On Lasix, Lovastatin, Diovan-HCT, Coumadin 5 mg, and synthroid.  3. Anemia -The patient has mild anemia with Hb 10.6 on 02/15/17 , likely caused by CKD. -Creatinine on 02/15/17 was 2.0 -Check her iron level on next visit.   PLAN -bilateral breast MRI with and without contrast for the first evaluation. -will decide if she needs more biopsy based on the results of the MRI -She will proceed breast surgery soon  -Oncotype DX testing if the tumor is >1 cm in size on surgical  pathology, or mammaprint if node positve  -Return to clinic after radiation, or sooner if Oncotype shows high risk disease. -Check her iron level on next visit.  No orders of the defined types were placed in this encounter.   All questions were answered. The patient knows to call the clinic with any problems, questions or concerns. I spent 55 minutes counseling the patient face to face. The total time spent in the appointment was 60 minutes and more than 50% was on counseling.     Truitt Merle, MD 02/15/2017    This document serves as a record of services personally performed by Truitt Merle, MD. It was created on her behalf by Darcus Austin, a trained medical scribe. The creation of this record is based on the scribe's personal observations and the provider's statements to them. This document has been checked and approved by the attending provider.

## 2017-02-15 NOTE — Progress Notes (Signed)
Nutrition Assessment  Reason for Assessment:  Pt seen in Breast Clinic  ASSESSMENT:   71 year old female with new diagnosis of left breast cancer.  Past medical history irregular heart beat, kidney pyelonephritis, arthritis  Medications:  reviewed  Labs: reviewed  Anthropometrics:   Height: 61 inches Weight: 280 lb BMI: 53.1   NUTRITION DIAGNOSIS: Food and nutrition related knowledge deficit related to new diagnosis of breast cancer as evidenced by no prior need for nutrition related information.  INTERVENTION:   Discussed and provided packet of information regarding nutritional tips for breast cancer patients.  Questions answered.  Teachback method used.  Contact information provided and patient knows to contact me with questions/concerns.    MONITORING, EVALUATION, and GOAL: Pt will consume a healthy plant based diet to maintain lean body mass throughout treatment.   Katelyn Jackson B. Zenia Resides, [place], [place] Registered Dietitian [phone] (pager)

## 2017-02-16 ENCOUNTER — Encounter: Payer: Self-pay | Admitting: Hematology

## 2017-02-16 DIAGNOSIS — D638 Anemia in other chronic diseases classified elsewhere: Secondary | ICD-10-CM | POA: Insufficient documentation

## 2017-02-18 ENCOUNTER — Ambulatory Visit (HOSPITAL_COMMUNITY): Admission: RE | Admit: 2017-02-18 | Payer: Medicare HMO | Source: Ambulatory Visit

## 2017-02-20 ENCOUNTER — Telehealth: Payer: Self-pay | Admitting: *Deleted

## 2017-02-20 NOTE — Telephone Encounter (Signed)
  Oncology Nurse Navigator Documentation  Navigator Location: [facility] (02/20/17 1400)   )Navigator Encounter Type: Telephone (02/20/17 1400) Telephone: Outgoing Call;Clinic/[facility] Follow-up (02/20/17 1400)                                                  Time Spent with Patient: 15 (02/20/17 1400)

## 2017-02-23 ENCOUNTER — Other Ambulatory Visit: Payer: Self-pay | Admitting: General Surgery

## 2017-02-23 DIAGNOSIS — Z170 Estrogen receptor positive status [ER+]: Principal | ICD-10-CM

## 2017-02-23 DIAGNOSIS — C50412 Malignant neoplasm of upper-outer quadrant of left female breast: Secondary | ICD-10-CM

## 2017-03-01 ENCOUNTER — Ambulatory Visit
Admission: RE | Admit: 2017-03-01 | Discharge: 2017-03-01 | Disposition: A | Discharge status: Home / Self Care | Payer: Medicare HMO | Source: Ambulatory Visit | Attending: General Surgery | Admitting: General Surgery

## 2017-03-01 ENCOUNTER — Other Ambulatory Visit: Payer: Self-pay | Admitting: General Surgery

## 2017-03-01 DIAGNOSIS — N6320 Unspecified lump in the left breast, unspecified quadrant: Secondary | ICD-10-CM

## 2017-03-01 DIAGNOSIS — C50412 Malignant neoplasm of upper-outer quadrant of left female breast: Secondary | ICD-10-CM

## 2017-03-01 DIAGNOSIS — Z170 Estrogen receptor positive status [ER+]: Principal | ICD-10-CM

## 2017-03-07 ENCOUNTER — Telehealth: Payer: Self-pay | Admitting: General Surgery

## 2017-03-07 NOTE — Telephone Encounter (Signed)
Had short discussion with son, will see patient and son in clinic tomorrow at 8:45 to discuss.

## 2017-03-08 ENCOUNTER — Other Ambulatory Visit: Payer: Self-pay | Admitting: General Surgery

## 2017-03-09 ENCOUNTER — Other Ambulatory Visit: Payer: Self-pay | Admitting: General Surgery

## 2017-03-21 ENCOUNTER — Ambulatory Visit (INDEPENDENT_AMBULATORY_CARE_PROVIDER_SITE_OTHER): Payer: Medicare HMO | Admitting: Podiatry

## 2017-03-21 ENCOUNTER — Other Ambulatory Visit: Payer: Self-pay | Admitting: General Surgery

## 2017-03-21 ENCOUNTER — Encounter: Payer: Self-pay | Admitting: Podiatry

## 2017-03-21 VITALS — BP 137/85 | HR 82

## 2017-03-21 DIAGNOSIS — I739 Peripheral vascular disease, unspecified: Secondary | ICD-10-CM | POA: Diagnosis not present

## 2017-03-21 DIAGNOSIS — B351 Tinea unguium: Secondary | ICD-10-CM | POA: Diagnosis not present

## 2017-03-21 DIAGNOSIS — I890 Lymphedema, not elsewhere classified: Secondary | ICD-10-CM | POA: Diagnosis not present

## 2017-03-21 DIAGNOSIS — C50312 Malignant neoplasm of lower-inner quadrant of left female breast: Secondary | ICD-10-CM

## 2017-03-21 NOTE — Progress Notes (Signed)
Patient ID: Katelyn Jackson, female   DOB: 1946/02/14, 71 y.o.   MRN: [id number]    Subjective: This patient presents today with her son present in the treatment room requesting toenail debridement. Patient has history of the foot edema and mycotic toenails that have been treated in our office Patient's daughter-in-law is present to treatment room  Objective: Patient is responsive to questioning Patient has immigrated from [place] and understands with assistance of her son English DP and PT pulses 1/4 bilaterally Capillary reflex immediate bilaterally Gross sensation to light touch intact bilaterally Vibratory sensation diminished bilaterally The toenails are elongated, brittle, discolored and tender to direct palpation 6-10 Absent hair growth bilaterally  Pes planus bilaterally  Assessment Lymphedema bilaterally Pes planus bilaterally Symptomatic onychomycoses 6-10  Plan: Debridement toenails 6-10 mechanically and electronically without any bleeding  Reappoint 3 months

## 2017-03-27 ENCOUNTER — Encounter (HOSPITAL_COMMUNITY)
Admission: RE | Admit: 2017-03-27 | Discharge: 2017-03-27 | Disposition: A | Discharge status: Home / Self Care | Payer: Medicare HMO | Source: Ambulatory Visit | Attending: General Surgery | Admitting: General Surgery

## 2017-03-27 ENCOUNTER — Encounter (HOSPITAL_COMMUNITY): Payer: Self-pay

## 2017-03-27 ENCOUNTER — Other Ambulatory Visit: Payer: Self-pay | Admitting: General Surgery

## 2017-03-27 DIAGNOSIS — Z170 Estrogen receptor positive status [ER+]: Secondary | ICD-10-CM | POA: Insufficient documentation

## 2017-03-27 DIAGNOSIS — C50412 Malignant neoplasm of upper-outer quadrant of left female breast: Secondary | ICD-10-CM | POA: Diagnosis not present

## 2017-03-27 DIAGNOSIS — Z01818 Encounter for other preprocedural examination: Secondary | ICD-10-CM | POA: Diagnosis present

## 2017-03-27 HISTORY — DX: Dyspnea, unspecified: R06.00

## 2017-03-27 HISTORY — DX: Chronic kidney disease, unspecified: N18.9

## 2017-03-27 HISTORY — DX: Cramp and spasm: R25.2

## 2017-03-27 HISTORY — DX: Malignant (primary) neoplasm, unspecified: C80.1

## 2017-03-27 HISTORY — DX: Cardiac arrhythmia, unspecified: I49.9

## 2017-03-27 HISTORY — DX: Headache, unspecified: R51.9

## 2017-03-27 HISTORY — DX: Gastro-esophageal reflux disease without esophagitis: K21.9

## 2017-03-27 HISTORY — DX: Headache: R51

## 2017-03-27 HISTORY — DX: Hypothyroidism, unspecified: E03.9

## 2017-03-27 LAB — BASIC METABOLIC PANEL
ANION GAP: 8 (ref 5–15)
BUN: 31 mg/dL — ABNORMAL HIGH (ref 6–20)
CHLORIDE: 104 mmol/L (ref 101–111)
CO2: 25 mmol/L (ref 22–32)
CREATININE: 1.68 mg/dL — AB (ref 0.44–1.00)
Calcium: 9.2 mg/dL (ref 8.9–10.3)
GFR calc non Af Amer: 30 mL/min — ABNORMAL LOW (ref >60)
GFR, EST AFRICAN AMERICAN: 34 mL/min — AB (ref >60)
Glucose, Bld: 96 mg/dL (ref 65–99)
Potassium: 3.6 mmol/L (ref 3.5–5.1)
SODIUM: 137 mmol/L (ref 135–145)

## 2017-03-27 LAB — CBC
HEMATOCRIT: 36.2 % (ref 36.0–46.0)
HEMOGLOBIN: 11.2 g/dL — AB (ref 12.0–15.0)
MCH: 28.3 pg (ref 26.0–34.0)
MCHC: 30.9 g/dL (ref 30.0–36.0)
MCV: 91.4 fL (ref 78.0–100.0)
Platelets: 237 10*3/uL (ref 150–400)
RBC: 3.96 MIL/uL (ref 3.87–5.11)
RDW: 14.1 % (ref 11.5–15.5)
WBC: 7.8 10*3/uL (ref 4.0–10.5)

## 2017-03-27 NOTE — Progress Notes (Addendum)
PCP is Dr Burman Riis  Heart Dr  is Dr. Gwenlyn Found- states she saw him May 2017 Kidney Dr is Dr Florene Glen Denies any chest pain. Reports last dose of coumadin will be May 23 PT to be drawn day of surgery.  Echo noted 2016 Boost given to pt and instructed to drink by 1000 on the day of surgery. Voices understanding.    States she had a stress test in 2016 in [place] with Dr  Marlowe Kays at [facility]  [phone]) Katelyn Jackson prefers [name] her son is with her today.

## 2017-03-27 NOTE — Pre-Procedure Instructions (Signed)
Kynesha Guerin  03/27/2017      [facility] [place] - [place], [place] - [address] [place] [address] Phone: [phone] Fax: [phone]    Your procedure is scheduled on May 29  Report to [place] at 1200   Call this number if you have problems the morning of surgery:  [phone]   Remember:  Do not eat food or drink liquids after midnight.  Take these medicines the morning of surgery with A SIP OF WATER febuxostat (Uloric), Levothyroxine (Synthroid), Omeprazole (Prilosec), Tizanidine (Zanaflex)  Stop coumadin as directed by your Dr.    Lazaro Arms not wear jewelry, make-up or nail polish.  Do not wear lotions, powders, or perfumes, or deoderant.  Do not shave 48 hours prior to surgery.  Men may shave face and neck.  Do not bring valuables to the hospital.  [facility] is not responsible for any belongings or valuables.  Contacts, dentures or bridgework may not be worn into surgery.  Leave your suitcase in the car.  After surgery it may be brought to your room.  For patients admitted to the hospital, discharge time will be determined by your treatment team.  Patients discharged the day of surgery will not be allowed to drive home.    Special instructions:  [facility] - Preparing for Surgery  Before surgery, you can play an important role.  Because skin is not sterile, your skin needs to be as free of germs as possible.  You can reduce the number of germs on you skin by washing with CHG (chlorahexidine gluconate) soap before surgery.  CHG is an antiseptic cleaner which kills germs and bonds with the skin to continue killing germs even after washing.  Please DO NOT use if you have an allergy to CHG or antibacterial soaps.  If your skin becomes reddened/irritated stop using the CHG and inform your nurse when you arrive at Short Stay.  Do not shave (including legs and underarms) for at least 48 hours prior to the  first CHG shower.  You may shave your face.  Please follow these instructions carefully:   1.  Shower with CHG Soap the night before surgery and the                                morning of Surgery.  2.  If you choose to wash your hair, wash your hair first as usual with your       normal shampoo.  3.  After you shampoo, rinse your hair and body thoroughly to remove the                      Shampoo.  4.  Use CHG as you would any other liquid soap.  You can apply chg directly       to the skin and wash gently with scrungie or a clean washcloth.  5.  Apply the CHG Soap to your body ONLY FROM THE NECK DOWN.        Do not use on open wounds or open sores.  Avoid contact with your eyes,       ears, mouth and genitals (private parts).  Wash genitals (private parts)       with your normal soap.  6.  Wash thoroughly, paying special attention to the area where your surgery  will be performed.  7.  Thoroughly rinse your body with warm water from the neck down.  8.  DO NOT shower/wash with your normal soap after using and rinsing off       the CHG Soap.  9.  Pat yourself dry with a clean towel.            10.  Wear clean pajamas.            11.  Place clean sheets on your bed the night of your first shower and do not        sleep with pets.  Day of Surgery  Do not apply any lotions/deoderants the morning of surgery.  Please wear clean clothes to the hospital/surgery center.     Please read over the following fact sheets that you were given. Pain Booklet, Coughing and Deep Breathing and Surgical Site Infection Prevention

## 2017-03-28 NOTE — Progress Notes (Addendum)
Anesthesia Chart Review:  Pt is a 71 year old female scheduled for L breast lumpectomy with needle localization 3 and axillary sentinel lymph node biopsy on 04/04/2017 with [name], M.D.  - PCP is Sandi Mariscal, MD - Cardiologist is Quay Burow, MD. Dr. Barry Dienes reached out to Dr. Gwenlyn Found about surgery, holding coumadin and he is aware of upcoming surgery.  - Nephrologist is Erling Cruz, MD who is aware of upcoming surgery - Oncologist is Truitt Merle, MD  PMH includes: Chronic atrial fibrillation, mild to moderate pulmonary hypertension, HTN, hyperlipidemia, CKD (stage 4), breast cancer, lymphedema, hypothyroidism, GERD. Never smoker. BMI 54.  Medications include: lasix, potassium, levothyroxine, lovastatin, prilosec, valsartan-hctz, coumadin. Last dose coumadin 03/29/17.   Preoperative labs reviewed.   - PT will be obtained DOS - Cr 1.68, BUN 31.  Known CKD; renal function appears improved compared with prior based on office note by Dr. Florene Glen (correspondence in media tab 02/22/17).   EKG 04/05/16: atrial fibrillation with a ventricular response of 94, nonspecific ST-T wave changes with left anterior fascicular block. There was incomplete left bundle branch block as well  Echo 05/14/15:  - LA severely dilated.  - RA moderately dilated - Mild mitral annular calcification. Trace mitral regurgitation - Mild tricuspid regurgitation - Mild to moderate pulmonary HTN. RSVP 46 mmHg. - Mild aortic valve sclerosis - LV systolic function is normal. EF 55-60%  If PT acceptable DOS, I anticipate pt can proceed as scheduled.   Willeen Cass, FNP-BC [facility]/Anesthesiology Phone: [phone] 03/28/2017 4:21 PM

## 2017-03-29 ENCOUNTER — Other Ambulatory Visit: Payer: Self-pay | Admitting: General Surgery

## 2017-03-29 DIAGNOSIS — C50412 Malignant neoplasm of upper-outer quadrant of left female breast: Secondary | ICD-10-CM

## 2017-04-04 ENCOUNTER — Encounter (HOSPITAL_COMMUNITY): Payer: Self-pay | Admitting: Urology

## 2017-04-04 ENCOUNTER — Ambulatory Visit
Admission: RE | Admit: 2017-04-04 | Discharge: 2017-04-04 | Disposition: A | Discharge status: Home / Self Care | Payer: Medicare HMO | Source: Ambulatory Visit | Attending: General Surgery | Admitting: General Surgery

## 2017-04-04 ENCOUNTER — Ambulatory Visit (HOSPITAL_COMMUNITY)
Admission: RE | Admit: 2017-04-04 | Discharge: 2017-04-04 | Disposition: A | Discharge status: Home / Self Care | Payer: Medicare HMO | Source: Ambulatory Visit | Attending: General Surgery | Admitting: General Surgery

## 2017-04-04 ENCOUNTER — Encounter (HOSPITAL_COMMUNITY)
Admission: RE | Admit: 2017-04-04 | Discharge: 2017-04-04 | Disposition: A | Discharge status: Home / Self Care | Payer: Medicare HMO | Source: Ambulatory Visit | Attending: General Surgery | Admitting: General Surgery

## 2017-04-04 ENCOUNTER — Other Ambulatory Visit: Payer: Self-pay | Admitting: General Surgery

## 2017-04-04 ENCOUNTER — Ambulatory Visit (HOSPITAL_COMMUNITY): Payer: Medicare HMO | Admitting: Emergency Medicine

## 2017-04-04 ENCOUNTER — Ambulatory Visit (HOSPITAL_COMMUNITY): Payer: Medicare HMO | Admitting: Anesthesiology

## 2017-04-04 ENCOUNTER — Encounter (HOSPITAL_COMMUNITY)
Admission: RE | Disposition: A | Discharge status: Home / Self Care | Payer: Self-pay | Source: Ambulatory Visit | Attending: General Surgery

## 2017-04-04 DIAGNOSIS — Z880 Allergy status to penicillin: Secondary | ICD-10-CM | POA: Diagnosis not present

## 2017-04-04 DIAGNOSIS — I10 Essential (primary) hypertension: Secondary | ICD-10-CM | POA: Diagnosis not present

## 2017-04-04 DIAGNOSIS — C50412 Malignant neoplasm of upper-outer quadrant of left female breast: Secondary | ICD-10-CM

## 2017-04-04 DIAGNOSIS — N6012 Diffuse cystic mastopathy of left breast: Secondary | ICD-10-CM | POA: Insufficient documentation

## 2017-04-04 DIAGNOSIS — K219 Gastro-esophageal reflux disease without esophagitis: Secondary | ICD-10-CM | POA: Diagnosis not present

## 2017-04-04 DIAGNOSIS — I4891 Unspecified atrial fibrillation: Secondary | ICD-10-CM | POA: Diagnosis not present

## 2017-04-04 DIAGNOSIS — M1990 Unspecified osteoarthritis, unspecified site: Secondary | ICD-10-CM | POA: Insufficient documentation

## 2017-04-04 DIAGNOSIS — Z8249 Family history of ischemic heart disease and other diseases of the circulatory system: Secondary | ICD-10-CM | POA: Insufficient documentation

## 2017-04-04 DIAGNOSIS — Z79899 Other long term (current) drug therapy: Secondary | ICD-10-CM | POA: Insufficient documentation

## 2017-04-04 DIAGNOSIS — Z7901 Long term (current) use of anticoagulants: Secondary | ICD-10-CM | POA: Diagnosis not present

## 2017-04-04 DIAGNOSIS — Z170 Estrogen receptor positive status [ER+]: Principal | ICD-10-CM

## 2017-04-04 DIAGNOSIS — Z823 Family history of stroke: Secondary | ICD-10-CM | POA: Diagnosis not present

## 2017-04-04 DIAGNOSIS — E039 Hypothyroidism, unspecified: Secondary | ICD-10-CM | POA: Insufficient documentation

## 2017-04-04 DIAGNOSIS — R0602 Shortness of breath: Secondary | ICD-10-CM | POA: Insufficient documentation

## 2017-04-04 DIAGNOSIS — C50312 Malignant neoplasm of lower-inner quadrant of left female breast: Secondary | ICD-10-CM

## 2017-04-04 HISTORY — PX: BREAST LUMPECTOMY WITH NEEDLE LOCALIZATION AND AXILLARY SENTINEL LYMPH NODE BX: SHX5760

## 2017-04-04 HISTORY — PX: BREAST LUMPECTOMY: SHX2

## 2017-04-04 LAB — PROTIME-INR
INR: 1.17
PROTHROMBIN TIME: 15 s (ref 11.4–15.2)

## 2017-04-04 SURGERY — BREAST LUMPECTOMY WITH NEEDLE LOCALIZATION AND AXILLARY SENTINEL LYMPH NODE BX
Anesthesia: General | Site: Breast | Laterality: Left

## 2017-04-04 MED ORDER — CELECOXIB 200 MG PO CAPS
400.0000 mg | ORAL_CAPSULE | ORAL | Status: AC
  Administered 2017-04-04: 400 mg via ORAL
  Filled 2017-04-04: qty 2

## 2017-04-04 MED ORDER — ONDANSETRON HCL 4 MG/2ML IJ SOLN: 4.0000 mg | Freq: Once | INTRAMUSCULAR | Status: DC | PRN

## 2017-04-04 MED ORDER — LIDOCAINE-EPINEPHRINE (PF) 1 %-1:200000 IJ SOLN
INTRAMUSCULAR | Status: AC
  Filled 2017-04-04: qty 30

## 2017-04-04 MED ORDER — SODIUM CHLORIDE 0.9 % IJ SOLN
INTRAMUSCULAR | Status: AC
  Filled 2017-04-04: qty 10

## 2017-04-04 MED ORDER — PROPOFOL 10 MG/ML IV BOLUS
INTRAVENOUS | Status: AC
  Filled 2017-04-04: qty 20

## 2017-04-04 MED ORDER — LIDOCAINE HCL 1 % IJ SOLN
INTRAMUSCULAR | Status: DC | PRN
  Administered 2017-04-04: 25 mL via INTRAMUSCULAR

## 2017-04-04 MED ORDER — CHLORHEXIDINE GLUCONATE CLOTH 2 % EX PADS: 6.0000 | MEDICATED_PAD | Freq: Once | CUTANEOUS | Status: DC

## 2017-04-04 MED ORDER — LIDOCAINE HCL (CARDIAC) 20 MG/ML IV SOLN
INTRAVENOUS | Status: DC | PRN
  Administered 2017-04-04: 100 mg via INTRAVENOUS

## 2017-04-04 MED ORDER — EPHEDRINE SULFATE 50 MG/ML IJ SOLN
INTRAMUSCULAR | Status: DC | PRN
  Administered 2017-04-04 (×3): 5 mg via INTRAVENOUS

## 2017-04-04 MED ORDER — 0.9 % SODIUM CHLORIDE (POUR BTL) OPTIME
TOPICAL | Status: DC | PRN
  Administered 2017-04-04: 1000 mL

## 2017-04-04 MED ORDER — METHYLENE BLUE 0.5 % INJ SOLN
INTRAVENOUS | Status: DC | PRN
  Administered 2017-04-04: 5 mL

## 2017-04-04 MED ORDER — PHENYLEPHRINE 40 MCG/ML (10ML) SYRINGE FOR IV PUSH (FOR BLOOD PRESSURE SUPPORT)
PREFILLED_SYRINGE | INTRAVENOUS | Status: DC | PRN
  Administered 2017-04-04 (×4): 80 ug via INTRAVENOUS
  Administered 2017-04-04: 40 ug via INTRAVENOUS

## 2017-04-04 MED ORDER — MIDAZOLAM HCL 5 MG/5ML IJ SOLN
INTRAMUSCULAR | Status: DC | PRN
  Administered 2017-04-04: 2 mg via INTRAVENOUS

## 2017-04-04 MED ORDER — PROPOFOL 10 MG/ML IV BOLUS
INTRAVENOUS | Status: DC | PRN
  Administered 2017-04-04: 150 mg via INTRAVENOUS

## 2017-04-04 MED ORDER — MEPERIDINE HCL 25 MG/ML IJ SOLN: 6.2500 mg | INTRAMUSCULAR | Status: DC | PRN

## 2017-04-04 MED ORDER — MIDAZOLAM HCL 2 MG/2ML IJ SOLN
2.0000 mg | Freq: Once | INTRAMUSCULAR | Status: AC
  Administered 2017-04-04: 2 mg via INTRAVENOUS

## 2017-04-04 MED ORDER — FENTANYL CITRATE (PF) 100 MCG/2ML IJ SOLN
50.0000 ug | Freq: Once | INTRAMUSCULAR | Status: AC
  Administered 2017-04-04: 50 ug via INTRAVENOUS

## 2017-04-04 MED ORDER — OXYCODONE HCL 5 MG PO TABS: 5.0000 mg | ORAL_TABLET | ORAL | Status: DC | PRN

## 2017-04-04 MED ORDER — MIDAZOLAM HCL 2 MG/2ML IJ SOLN
INTRAMUSCULAR | Status: AC
  Administered 2017-04-04: 2 mg via INTRAVENOUS
  Filled 2017-04-04: qty 2

## 2017-04-04 MED ORDER — LACTATED RINGERS IV SOLN
INTRAVENOUS | Status: DC | PRN
Start: 2017-04-04 — End: 2017-04-04
  Administered 2017-04-04 (×2): via INTRAVENOUS

## 2017-04-04 MED ORDER — HYDROMORPHONE HCL 1 MG/ML IJ SOLN: 0.2500 mg | INTRAMUSCULAR | Status: DC | PRN

## 2017-04-04 MED ORDER — GABAPENTIN 300 MG PO CAPS
300.0000 mg | ORAL_CAPSULE | ORAL | Status: AC
  Administered 2017-04-04: 300 mg via ORAL
  Filled 2017-04-04: qty 1

## 2017-04-04 MED ORDER — ACETAMINOPHEN 650 MG RE SUPP: 650.0000 mg | RECTAL | Status: DC | PRN

## 2017-04-04 MED ORDER — LACTATED RINGERS IV SOLN
INTRAVENOUS | Status: DC
  Administered 2017-04-04: 13:00:00 via INTRAVENOUS

## 2017-04-04 MED ORDER — MIDAZOLAM HCL 2 MG/2ML IJ SOLN
INTRAMUSCULAR | Status: AC
  Filled 2017-04-04: qty 2

## 2017-04-04 MED ORDER — PHENYLEPHRINE 40 MCG/ML (10ML) SYRINGE FOR IV PUSH (FOR BLOOD PRESSURE SUPPORT)
PREFILLED_SYRINGE | INTRAVENOUS | Status: AC
  Filled 2017-04-04: qty 10

## 2017-04-04 MED ORDER — METHYLENE BLUE 0.5 % INJ SOLN
INTRAVENOUS | Status: AC
  Filled 2017-04-04: qty 10

## 2017-04-04 MED ORDER — ONDANSETRON HCL 4 MG/2ML IJ SOLN
INTRAMUSCULAR | Status: AC
  Filled 2017-04-04: qty 2

## 2017-04-04 MED ORDER — FENTANYL CITRATE (PF) 100 MCG/2ML IJ SOLN
INTRAMUSCULAR | Status: AC
  Administered 2017-04-04: 50 ug via INTRAVENOUS
  Filled 2017-04-04: qty 2

## 2017-04-04 MED ORDER — SODIUM CHLORIDE 0.9 % IV SOLN: 250.0000 mL | INTRAVENOUS | Status: DC | PRN

## 2017-04-04 MED ORDER — FENTANYL CITRATE (PF) 100 MCG/2ML IJ SOLN
INTRAMUSCULAR | Status: DC | PRN
  Administered 2017-04-04 (×5): 25 ug via INTRAVENOUS

## 2017-04-04 MED ORDER — BUPIVACAINE HCL (PF) 0.25 % IJ SOLN
INTRAMUSCULAR | Status: AC
  Filled 2017-04-04: qty 30

## 2017-04-04 MED ORDER — ACETAMINOPHEN 325 MG PO TABS
650.0000 mg | ORAL_TABLET | ORAL | Status: DC | PRN
Start: 2017-04-04 — End: 2017-04-04

## 2017-04-04 MED ORDER — TECHNETIUM TC 99M SULFUR COLLOID FILTERED
1.0000 | Freq: Once | INTRAVENOUS | Status: AC | PRN
  Administered 2017-04-04: 1 via INTRADERMAL

## 2017-04-04 MED ORDER — ROCURONIUM BROMIDE 10 MG/ML (PF) SYRINGE
PREFILLED_SYRINGE | INTRAVENOUS | Status: AC
  Filled 2017-04-04: qty 5

## 2017-04-04 MED ORDER — LIDOCAINE 2% (20 MG/ML) 5 ML SYRINGE
INTRAMUSCULAR | Status: AC
  Filled 2017-04-04: qty 5

## 2017-04-04 MED ORDER — BUPIVACAINE-EPINEPHRINE (PF) 0.5% -1:200000 IJ SOLN
INTRAMUSCULAR | Status: DC | PRN
  Administered 2017-04-04: 30 mL

## 2017-04-04 MED ORDER — ONDANSETRON HCL 4 MG/2ML IJ SOLN
INTRAMUSCULAR | Status: DC | PRN
  Administered 2017-04-04: 4 mg via INTRAVENOUS

## 2017-04-04 MED ORDER — FENTANYL CITRATE (PF) 250 MCG/5ML IJ SOLN
INTRAMUSCULAR | Status: AC
  Filled 2017-04-04: qty 5

## 2017-04-04 MED ORDER — EPHEDRINE 5 MG/ML INJ
INTRAVENOUS | Status: AC
  Filled 2017-04-04: qty 10

## 2017-04-04 MED ORDER — LIDOCAINE HCL 1 % IJ SOLN
INTRAMUSCULAR | Status: AC
  Filled 2017-04-04: qty 20

## 2017-04-04 MED ORDER — SODIUM CHLORIDE 0.9% FLUSH: 3.0000 mL | Freq: Two times a day (BID) | INTRAVENOUS | Status: DC

## 2017-04-04 MED ORDER — CIPROFLOXACIN IN D5W 400 MG/200ML IV SOLN
400.0000 mg | INTRAVENOUS | Status: AC
  Administered 2017-04-04: 400 mg via INTRAVENOUS
  Filled 2017-04-04: qty 200

## 2017-04-04 MED ORDER — SODIUM CHLORIDE 0.9% FLUSH: 3.0000 mL | INTRAVENOUS | Status: DC | PRN

## 2017-04-04 MED ORDER — OXYCODONE HCL 5 MG PO TABS: 5.0000 mg | ORAL_TABLET | Freq: Four times a day (QID) | ORAL | 0 refills | Status: DC | PRN

## 2017-04-04 SURGICAL SUPPLY — 57 items
BINDER BREAST LRG (GAUZE/BANDAGES/DRESSINGS) IMPLANT
BINDER BREAST XLRG (GAUZE/BANDAGES/DRESSINGS) IMPLANT
BINDER BREAST XXLRG (GAUZE/BANDAGES/DRESSINGS) ×2 IMPLANT
BLADE SURG 10 STRL SS (BLADE) ×2 IMPLANT
BNDG COHESIVE 4X5 TAN STRL (GAUZE/BANDAGES/DRESSINGS) ×2 IMPLANT
CANISTER SUCT 3000ML PPV (MISCELLANEOUS) ×2 IMPLANT
CHLORAPREP W/TINT 26ML (MISCELLANEOUS) ×2 IMPLANT
CLIP TI LARGE 6 (CLIP) ×2 IMPLANT
CLIP TI MEDIUM 24 (CLIP) ×2 IMPLANT
CLIP TI WIDE RED SMALL 24 (CLIP) ×2 IMPLANT
CONT SPEC 4OZ CLIKSEAL STRL BL (MISCELLANEOUS) ×4 IMPLANT
COVER MAYO STAND STRL (DRAPES) ×2 IMPLANT
COVER PROBE W GEL 5X96 (DRAPES) ×2 IMPLANT
COVER SURGICAL LIGHT HANDLE (MISCELLANEOUS) ×2 IMPLANT
DECANTER SPIKE VIAL GLASS SM (MISCELLANEOUS) ×4 IMPLANT
DERMABOND ADVANCED (GAUZE/BANDAGES/DRESSINGS) ×1
DERMABOND ADVANCED .7 DNX12 (GAUZE/BANDAGES/DRESSINGS) ×1 IMPLANT
DEVICE DUBIN SPECIMEN MAMMOGRA (MISCELLANEOUS) ×2 IMPLANT
DRAPE UTILITY XL STRL (DRAPES) ×4 IMPLANT
DRSG PAD ABDOMINAL 8X10 ST (GAUZE/BANDAGES/DRESSINGS) ×2 IMPLANT
ELECT CAUTERY BLADE 6.4 (BLADE) ×2 IMPLANT
ELECT REM PT RETURN 9FT ADLT (ELECTROSURGICAL) ×2
ELECTRODE REM PT RTRN 9FT ADLT (ELECTROSURGICAL) ×1 IMPLANT
GAUZE SPONGE 4X4 12PLY STRL (GAUZE/BANDAGES/DRESSINGS) ×2 IMPLANT
GLOVE BIO SURGEON STRL SZ 6 (GLOVE) ×2 IMPLANT
GLOVE BIOGEL PI IND STRL 6.5 (GLOVE) ×1 IMPLANT
GLOVE BIOGEL PI INDICATOR 6.5 (GLOVE) ×1
GOWN STRL REUS W/ TWL LRG LVL3 (GOWN DISPOSABLE) ×1 IMPLANT
GOWN STRL REUS W/TWL 2XL LVL3 (GOWN DISPOSABLE) ×2 IMPLANT
GOWN STRL REUS W/TWL LRG LVL3 (GOWN DISPOSABLE) ×1
KIT BASIN OR (CUSTOM PROCEDURE TRAY) ×2 IMPLANT
KIT MARKER MARGIN INK (KITS) ×2 IMPLANT
KIT ROOM TURNOVER OR (KITS) ×2 IMPLANT
LIGHT WAVEGUIDE WIDE FLAT (MISCELLANEOUS) ×2 IMPLANT
NEEDLE 18GX1X1/2 (RX/OR ONLY) (NEEDLE) ×2 IMPLANT
NEEDLE FILTER BLUNT 18X 1/2SAF (NEEDLE) ×1
NEEDLE FILTER BLUNT 18X1 1/2 (NEEDLE) ×1 IMPLANT
NEEDLE HYPO 25GX1X1/2 BEV (NEEDLE) ×4 IMPLANT
NS IRRIG 1000ML POUR BTL (IV SOLUTION) ×2 IMPLANT
PACK SURGICAL SETUP 50X90 (CUSTOM PROCEDURE TRAY) ×2 IMPLANT
PACK UNIVERSAL I (CUSTOM PROCEDURE TRAY) ×2 IMPLANT
PAD ARMBOARD 7.5X6 YLW CONV (MISCELLANEOUS) ×2 IMPLANT
PENCIL BUTTON HOLSTER BLD 10FT (ELECTRODE) ×2 IMPLANT
SPONGE LAP 18X18 X RAY DECT (DISPOSABLE) ×2 IMPLANT
STOCKINETTE IMPERVIOUS 9X36 MD (GAUZE/BANDAGES/DRESSINGS) ×2 IMPLANT
STRIP CLOSURE SKIN 1/2X4 (GAUZE/BANDAGES/DRESSINGS) ×2 IMPLANT
SUT MNCRL AB 4-0 PS2 18 (SUTURE) ×2 IMPLANT
SUT SILK 2 0 SH (SUTURE) IMPLANT
SUT VIC AB 2-0 SH 27 (SUTURE) ×1
SUT VIC AB 2-0 SH 27XBRD (SUTURE) ×1 IMPLANT
SUT VIC AB 3-0 SH 27 (SUTURE) ×1
SUT VIC AB 3-0 SH 27X BRD (SUTURE) ×1 IMPLANT
SYR CONTROL 10ML LL (SYRINGE) ×4 IMPLANT
TOWEL OR 17X24 6PK STRL BLUE (TOWEL DISPOSABLE) ×2 IMPLANT
TOWEL OR 17X26 10 PK STRL BLUE (TOWEL DISPOSABLE) ×2 IMPLANT
TUBE CONNECTING 12X1/4 (SUCTIONS) ×2 IMPLANT
YANKAUER SUCT BULB TIP NO VENT (SUCTIONS) ×2 IMPLANT

## 2017-04-04 NOTE — H&P (Signed)
Katelyn Jackson 03/08/2017 9:05 AM Location: [facility] Patient #: [id number] DOB: 1946-03-15 Widowed / Language: [name] / Race: White Female   History of Present Illness [name] MD; 03/08/2017 9:36 AM) The patient is a 71 year old female who presents for a follow-up for Breast cancer. Patient is a 71 year old female who presents with a new diagnosis of left breast cancer. The patient was found to have screening detected left breast asymmetry. Diagnostic imaging was performed and demonstrated a 6 mm area of concern at 1:00, a 4 mm area of concern at 3:00, and an additional area of concern at 2:30. The 1:00 and 3:00 locations were biopsied and both were grade 1 invasive ductal carcinoma. Prognostic panel showed ER and PR positive, HER-2 negative, and Ki-67 was 10%.  The patient got a third biopsy at 2:30 as she is unable to get an MRI due to her creatinine clearance. This also was positive for invasive ductal carcinoma. She is brought in to discuss pros and cons of mastectomy versus bracketed lumpectomy with 3 wires.   Past Surgical History Jerrye Bushy, [place]; 03/08/2017 9:06 AM) Appendectomy  Breast Biopsy  Left. multiple Cataract Surgery  Bilateral. Mammoplasty; Reduction  Bilateral.  Diagnostic Studies History Jerrye Bushy, [place]; 03/08/2017 9:06 AM) Colonoscopy  1-5 years ago Mammogram  within last year  Allergies Jerrye Bushy, [place]; 03/08/2017 9:06 AM) Penicillin G Potassium *PENICILLINS*   Medication History Jerrye Bushy, RMA; 03/08/2017 9:08 AM) Creon (12000UNIT Capsule DR Part, Oral) Active. Durezol (0.05% Emulsion, Ophthalmic) Active. Diclofenac Sodium (1% Gel, Transdermal) Active. Besivance (0.6% Suspension, Ophthalmic) Active. Furosemide (40MG Tablet, Oral) Active. Latanoprost (0.005% Solution, Ophthalmic) Active. Levothyroxine Sodium (50MCG Tablet, Oral) Active. Lovastatin (20MG Tablet, Oral) Active. Omeprazole (40MG Capsule DR, Oral)  Active. Polyethylene Glycol 3350 (Oral) Active. TiZANidine HCl (4MG Tablet, Oral) Active. Uloric (40MG Tablet, Oral) Active. Valsartan-Hydrochlorothiazide (320-25MG Tablet, Oral) Active. Warfarin Sodium (5MG Tablet, Oral) Active. Ketorolac Tromethamine (0.4% Solution, Ophthalmic) Active. Potassium Chloride (20MEQ Tablet ER, Oral) Active. Medications Reconciled  Social History Jerrye Bushy, [place]; 03/08/2017 9:06 AM) No alcohol use  No caffeine use  No drug use  Tobacco use  Never smoker.  Family History Jerrye Bushy, [place]; 03/08/2017 9:06 AM) Cerebrovascular Accident  Mother. Heart Disease  Mother. Heart disease in female family member before age 72  Hypertension  Mother.  Pregnancy / Birth History Jerrye Bushy, [place]; 03/08/2017 9:06 AM) Age at menarche  74 years. Age of menopause  47-55 Gravida  2 Length (months) of breastfeeding  7-12 Maternal age  50-20 Para  2  Other Problems Jerrye Bushy, [place]; 03/08/2017 9:06 AM) Arthritis  Breast Cancer  High blood pressure  Thyroid Disease     Review of Systems [name] MD; 03/08/2017 9:36 AM) General Not Present- Appetite Loss, Chills, Fatigue, Fever, Night Sweats, Weight Gain and Weight Loss. Skin Not Present- Change in Wart/Mole, Dryness, Hives, Jaundice, New Lesions, Non-Healing Wounds, Rash and Ulcer. HEENT Not Present- Earache, Hearing Loss, Hoarseness, Nose Bleed, Oral Ulcers, Ringing in the Ears, Seasonal Allergies, Sinus Pain, Sore Throat, Visual Disturbances, Wears glasses/contact lenses and Yellow Eyes. Respiratory Not Present- Bloody sputum, Chronic Cough, Difficulty Breathing, Snoring and Wheezing. Breast Not Present- Breast Mass, Breast Pain, Nipple Discharge and Skin Changes. Cardiovascular Present- Leg Cramps, Shortness of Breath and Swelling of Extremities. Not Present- Chest Pain, Difficulty Breathing Lying Down, Palpitations and Rapid Heart Rate. Gastrointestinal Not Present- Abdominal Pain,  Bloating, Bloody Stool, Change in Bowel Habits, Chronic diarrhea, Constipation, Difficulty Swallowing, Excessive gas, Gets full quickly  at meals, Hemorrhoids, Indigestion, Nausea, Rectal Pain and Vomiting. Female Genitourinary Not Present- Frequency, Nocturia, Painful Urination, Pelvic Pain and Urgency. Musculoskeletal Not Present- Back Pain, Joint Pain, Joint Stiffness, Muscle Pain, Muscle Weakness and Swelling of Extremities. Neurological Not Present- Decreased Memory, Fainting, Headaches, Numbness, Seizures, Tingling, Tremor, Trouble walking and Weakness. Psychiatric Not Present- Anxiety, Bipolar, Change in Sleep Pattern, Depression, Fearful and Frequent crying. Endocrine Not Present- Cold Intolerance, Excessive Hunger, Hair Changes, Heat Intolerance, Hot flashes and New Diabetes. Hematology Present- Blood Thinners. Not Present- Easy Bruising, Excessive bleeding, Gland problems, HIV and Persistent Infections. All other systems negative  [employer] RMA; 03/08/2017 9:09 AM) 03/08/2017 9:08 AM Weight: 276.8 lb Height: 62in Body Surface Area: 2.19 m Body Mass Index: 50.63 kg/m  Temp.: 98.38F  Pulse: 90 (Regular)  P.OX: 98% (Room air) BP: 160/80 (Sitting, Left Arm, Standard)       Physical Exam [name] MD; 03/08/2017 9:37 AM) General Mental Status-Alert. General Appearance-Consistent with stated age. Hydration-Well hydrated. Voice-Normal.  Head and Neck Head-normocephalic, atraumatic with no lesions or palpable masses.  Chest and Lung Exam Chest and lung exam reveals -quiet, even and easy respiratory effort with no use of accessory muscles. Inspection Chest Wall - Normal. Back - normal.  Breast Note: no change. no palpable masses   Cardiovascular Cardiovascular examination reveals -normal pedal pulses bilaterally. Note: regular rate and rhythm    Assessment & Plan [name] MD; 03/08/2017 9:39 AM) MALIGNANT NEOPLASM OF UPPER-OUTER  QUADRANT OF LEFT BREAST IN FEMALE, ESTROGEN RECEPTOR POSITIVE (C50.412) Impression: Patient is leaning toward a mastectomy. She would like to speak with a survivor if possible to discuss mastectomy. I offered mastectomy with sentinel node versus bracketed lumpectomy with sentinel node. I discussed prs and cons with a possible need to go back for additional margins with lumpectomy. I discussed that mastectomy does not give 100% guarantee of no recurrent cancer. I also reviewed that there could be dissatisfaction with her scar or breast appearance with either strategy. We discussed reconstruction and she was not interested in seeing a Psychiatric nurse.  She'll need to hold her come in for surgery. DR. Alvester Chou A MESSAGE ABOUT THIS. I HAVE ALSO SENT A MESSAGE TO THE BREAST NAVIGATORS TO SEE IF THEY CAN FIND A SURVIVOR TO SPEAK WITH HER.  31 min spent in evaluation, examination, counseling, and coordination of care. >50% spent in counseling.  Posting sheet is completed and epic orders are done. Current Plans You are being scheduled for surgery- Our schedulers will call you.  You should hear from our office's scheduling department within 5 working days about the location, date, and time of surgery. We try to make accommodations for patient's preferences in scheduling surgery, but sometimes the OR schedule or the surgeon's schedule prevents [place] from making those accommodations.  If you have not heard from our office [phone]) in 5 working days, call the office and ask for your surgeon's nurse.  If you have other questions about your diagnosis, plan, or surgery, call the office and ask for your surgeon's nurse.  Pt Education - [facility] Mastectomy [facility]   Signed by [name], MD (03/08/2017 9:40 AM)

## 2017-04-04 NOTE — Anesthesia Procedure Notes (Signed)
Anesthesia Regional Block: Pectoralis block   Pre-Anesthetic Checklist: ,, timeout performed, Correct Patient, Correct Site, Correct Laterality, Correct Procedure, Correct Position, site marked, Risks and benefits discussed,  Surgical consent,  Pre-op evaluation,  At surgeon's request and post-op pain management  Laterality: Left  Prep: chloraprep       Needles:  Injection technique: Single-shot     Needle Length: 9cm  Needle Gauge: 21     Additional Needles:   Procedures: ultrasound guided,,,,,,,,  Narrative:  Start time: 04/04/2017 1:25 PM End time: 04/04/2017 12:35 PM Injection made incrementally with aspirations every 5 mL.  Performed by: Personally  Anesthesiologist: Lillia Abed  Additional Notes: Monitors applied. Patient sedated. Sterile prep and drape,hand hygiene and sterile gloves were used. Relevant anatomy identified.Needle position confirmed.Local anesthetic injected incrementally after negative aspiration. Local anesthetic spread visualized. Vascular puncture avoided. No complications. Image printed for medical record.The patient tolerated the procedure well.

## 2017-04-04 NOTE — Interval H&P Note (Signed)
History and Physical Interval Note:  04/04/2017 1:37 PM  Katelyn Jackson  has presented today for surgery, with the diagnosis of LEFT BREAST CANCER  The various methods of treatment have been discussed with the patient and family. After consideration of risks, benefits and other options for treatment, the patient has consented to  Procedure(s): BREAST LUMPECTOMY WITH NEEDLE LOCALIZATION x3 AND AXILLARY SENTINEL LYMPH NODE BX (Left) as a surgical intervention .  The patient's history has been reviewed, patient examined, no change in status, stable for surgery.  I have reviewed the patient's chart and labs.  Questions were answered to the patient's satisfaction.     [name]

## 2017-04-04 NOTE — Op Note (Signed)
Left Breast bracketed needle localized Lumpectomy with Sentinel Node Mapping and Biopsy Procedure Note  Indications: This patient presents with history of left multifocal breast cancer with clinically negative axillary lymph node exam.  Pre-operative Diagnosis: left breast cancer, cmT1bN0, UOQ, ER/PR +  Post-operative Diagnosis: left breast cancer  Surgeon: [name]   Assistants: n/a  Anesthesia: General LMA anesthesia and Local anesthesia 1% plain lidocaine, 0.25.% bupivacaine  ASA Class: 3  Procedure Details  The patient was seen in the Holding Room. The risks, benefits, complications, treatment options, and expected outcomes were discussed with the patient. The possibilities of reaction to medication, pulmonary aspiration, bleeding, infection, the need for additional procedures, failure to diagnose a condition, and creating a complication requiring transfusion or operation were discussed with the patient. The patient concurred with the proposed plan, giving informed consent.  The site of surgery properly noted/marked. The patient was taken to Operating Room # 2, identified as Katelyn Jackson and the procedure verified as Left needle localized (bracketed x 3) Breast Lumpectomy and Sentinel Node mapping and Biopsy. A Time Out was held and the above information confirmed. Methylene blue was injected.    After induction of anesthesia, the left arm, breast, and chest were prepped and draped in standard fashion.     The lumpectomy was performed by creating a lateral incision of the breast around the previously placed localization guidewires.  Dissection was carried down to the pectoral fascia with the cautery.  All the wires were taken with one lumpectomy. The specimen was marked with the margin marker paint kit.  Specimen radiography confirmed inclusion of the mammographic lesions and 3 clips.  Hemostasis was achieved with cautery.  The wound was irrigated and closed with a 3-0 Vicryl deep  dermal interrupted and a 4-0 Monocryl subcuticular closure in layers.  Using a hand-held gamma probe, axillary sentinel nodes were identified transcutaneously.  An oblique incision was created below the axillary hairline.  Dissection was carried through the clavipectoral fascia.  Two deep level 2 axillary sentinel nodes were removed and submitted to pathology revealing. The axillary incision was closed with 3-0 vicryl deep dermal interrupted sutures and 4-0 monocryl subcuticular closure in layers.    Sterile dressings were applied. At the end of the operation, all sponge, instrument, and needle counts were correct.  Findings: grossly clear surgical margins and two hot sentinel lymph nodes.  #1 cps 20, #2 cps 55.  Background count 0  Estimated Blood Loss:  Minimal         Specimens: left breast lumpectomy, left axillary SLN #1 and left axillary SLN #2                Complications:  None; patient tolerated the procedure well.         Disposition: PACU - hemodynamically stable.         Condition: stable

## 2017-04-04 NOTE — Anesthesia Procedure Notes (Deleted)
Performed by: Orlie Dakin

## 2017-04-04 NOTE — Transfer of Care (Signed)
Immediate Anesthesia Transfer of Care Note  Patient: Katelyn Jackson  Procedure(s) Performed: Procedure(s): BREAST LUMPECTOMY WITH NEEDLE LOCALIZATION x3 AND AXILLARY SENTINEL LYMPH NODE BX (Left)  Patient Location: PACU  Anesthesia Type:General  Level of Consciousness: awake, alert , oriented and patient cooperative  Airway & Oxygen Therapy: Patient Spontanous Breathing and Patient connected to face mask oxygen  Post-op Assessment: Report given to RN and Post -op Vital signs reviewed and stable  Post vital signs: Reviewed and stable  Last Vitals:  Vitals:   04/04/17 1245 04/04/17 1250  BP: (!) 149/54 (!) 153/63  Pulse: (!) 108 (!) 131  Resp: (!) 33 (!) 37  Temp:      Last Pain:  Vitals:   04/04/17 1103  TempSrc: Oral         Complications: none

## 2017-04-04 NOTE — Anesthesia Preprocedure Evaluation (Addendum)
Anesthesia Evaluation  Patient identified by MRN, date of birth, ID band Patient awake    Reviewed: Allergy & Precautions, NPO status , Patient's Chart, lab work & pertinent test results  Airway Mallampati: I  TM Distance: >3 FB Neck ROM: Full    Dental  (+) Edentulous Upper, Edentulous Lower, Dental Advisory Given   Pulmonary shortness of breath and with exertion,    Pulmonary exam normal        Cardiovascular hypertension, Pt. on medications Normal cardiovascular exam+ dysrhythmias Atrial Fibrillation      Neuro/Psych  Headaches,    GI/Hepatic GERD  Medicated and Controlled,  Endo/Other  Hypothyroidism   Renal/GU Renal InsufficiencyRenal disease     Musculoskeletal  (+) Arthritis , Osteoarthritis,    Abdominal   Peds  Hematology  (+) anemia ,   Anesthesia Other Findings   Reproductive/Obstetrics                            Anesthesia Physical Anesthesia Plan  ASA: III  Anesthesia Plan: General   Post-op Pain Management:    Induction: Intravenous  Airway Management Planned: LMA  Additional Equipment:   Intra-op Plan:   Post-operative Plan: Extubation in OR  Informed Consent: I have reviewed the patients History and Physical, chart, labs and discussed the procedure including the risks, benefits and alternatives for the proposed anesthesia with the patient or authorized representative who has indicated his/her understanding and acceptance.   Dental advisory given  Plan Discussed with: CRNA, Surgeon and Anesthesiologist  Anesthesia Plan Comments:        Anesthesia Quick Evaluation

## 2017-04-04 NOTE — Discharge Instructions (Addendum)
[facility] °Office Phone Number [phone] ° °BREAST BIOPSY/ PARTIAL MASTECTOMY: POST OP INSTRUCTIONS ° °Always review your discharge instruction sheet given to you by the facility where your surgery was performed. ° °IF YOU HAVE DISABILITY OR FAMILY LEAVE FORMS, YOU MUST BRING THEM TO THE OFFICE FOR PROCESSING.  DO NOT GIVE THEM TO YOUR DOCTOR. ° °1. A prescription for pain medication may be given to you upon discharge.  Take your pain medication as prescribed, if needed.  If narcotic pain medicine is not needed, then you may take acetaminophen (Tylenol) or ibuprofen (Advil) as needed. °2. Take your usually prescribed medications unless otherwise directed °3. If you need a refill on your pain medication, please contact your pharmacy.  They will contact our office to request authorization.  Prescriptions will not be filled after 5pm or on week-ends. °4. You should eat very light the first 24 hours after surgery, such as soup, crackers, pudding, etc.  Resume your normal diet the day after surgery. °5. Most patients will experience some swelling and bruising in the breast.  Ice packs and a good support bra will help.  Swelling and bruising can take several days to resolve.  °6. It is common to experience some constipation if taking pain medication after surgery.  Increasing fluid intake and taking a stool softener will usually help or prevent this problem from occurring.  A mild laxative (Milk of Magnesia or Miralax) should be taken according to package directions if there are no bowel movements after 48 hours. °7. Unless discharge instructions indicate otherwise, you may remove your bandages 48 hours after surgery, and you may shower at that time.  You may have steri-strips (small skin tapes) in place directly over the incision.  These strips should be left on the skin for 7-10 days.   Any sutures or staples will be removed at the office during your follow-up visit. °8. ACTIVITIES:  You may resume  regular daily activities (gradually increasing) beginning the next day.  Wearing a good support bra or sports bra (or the breast binder) minimizes pain and swelling.  You may have sexual intercourse when it is comfortable. °a. You may drive when you no longer are taking prescription pain medication, you can comfortably wear a seatbelt, and you can safely maneuver your car and apply brakes. °b. RETURN TO WORK:  __________1 week_______________ °9. You should see your doctor in the office for a follow-up appointment approximately two weeks after your surgery.  Your doctor’s nurse will typically make your follow-up appointment when she calls you with your pathology report.  Expect your pathology report 2-3 business days after your surgery.  You may call to check if you do not hear from us after three days. ° ° °WHEN TO CALL YOUR DOCTOR: °1. Fever over 101.0 °2. Nausea and/or vomiting. °3. Extreme swelling or bruising. °4. Continued bleeding from incision. °5. Increased pain, redness, or drainage from the incision. ° °The clinic staff is available to answer your questions during regular business hours.  Please don’t hesitate to call and ask to speak to one of the nurses for clinical concerns.  If you have a medical emergency, go to the nearest emergency room or call 911.  A surgeon from [facility] is always on call at the hospital. ° °For further questions, please visit [email or web address]  ° °

## 2017-04-05 ENCOUNTER — Encounter (HOSPITAL_COMMUNITY): Payer: Self-pay | Admitting: General Surgery

## 2017-04-05 NOTE — Anesthesia Postprocedure Evaluation (Signed)
Anesthesia Post Note  Patient: Katelyn Jackson  Procedure(s) Performed: Procedure(s) (LRB): BREAST LUMPECTOMY WITH NEEDLE LOCALIZATION x3 AND AXILLARY SENTINEL LYMPH NODE BX (Left)  Patient location during evaluation: PACU Anesthesia Type: General Level of consciousness: awake and alert Pain management: pain level controlled Vital Signs Assessment: post-procedure vital signs reviewed and stable Respiratory status: spontaneous breathing, nonlabored ventilation, respiratory function stable and patient connected to nasal cannula oxygen Cardiovascular status: blood pressure returned to baseline and stable Postop Assessment: no signs of nausea or vomiting Anesthetic complications: no       Last Vitals:  Vitals:   04/04/17 1625 04/04/17 1655  BP: 125/80 126/82  Pulse: 77   Resp: 16   Temp:      Last Pain:  Vitals:   04/04/17 1655  TempSrc:   PainSc: 0-No pain                 Ryan P Ellender

## 2017-04-11 ENCOUNTER — Telehealth: Payer: Self-pay | Admitting: *Deleted

## 2017-04-11 NOTE — Telephone Encounter (Signed)
Ordered oncotype per Dr. Burr Medico.  Faxed requisition to pathology and confirmed receipt.

## 2017-04-17 ENCOUNTER — Telehealth: Payer: Self-pay | Admitting: General Surgery

## 2017-04-17 ENCOUNTER — Other Ambulatory Visit: Payer: Self-pay | Admitting: General Surgery

## 2017-04-17 NOTE — Telephone Encounter (Signed)
Discussed close margin with son, [name].  Will set up reexcision for close DCIS margin.

## 2017-04-19 ENCOUNTER — Telehealth: Payer: Self-pay | Admitting: *Deleted

## 2017-04-19 ENCOUNTER — Encounter (HOSPITAL_COMMUNITY): Payer: Self-pay

## 2017-04-19 NOTE — Telephone Encounter (Signed)
Received Oncotype Dx results of 18/12%.  Gave Dr. Burr Medico a copy, placed a copy in Keisha's basket and took a copy to HIM to scan.

## 2017-04-20 ENCOUNTER — Telehealth: Payer: Self-pay | Admitting: *Deleted

## 2017-04-20 NOTE — Telephone Encounter (Signed)
Spoke with son [name] to give him the oncotype results.  Informed him they would get a call to schedule an appointment with Dr. Lisbeth Renshaw to discuss radiation and Dr. Burr Medico will see after XRT complete.  He verbalized understanding.

## 2017-04-21 ENCOUNTER — Encounter: Payer: Self-pay | Admitting: Radiation Oncology

## 2017-04-25 ENCOUNTER — Encounter (HOSPITAL_BASED_OUTPATIENT_CLINIC_OR_DEPARTMENT_OTHER): Payer: Self-pay | Admitting: *Deleted

## 2017-04-25 MED ORDER — CIPROFLOXACIN IN D5W 400 MG/200ML IV SOLN
400.0000 mg | INTRAVENOUS | Status: AC
  Administered 2017-04-26: 400 mg via INTRAVENOUS
  Filled 2017-04-25: qty 200

## 2017-04-25 NOTE — Progress Notes (Signed)
Pt SDW-Pre-op call completed by pt Son, [name] (DPR). [name] denies that pt C/O any acute cardiopulmonary issues. [name] stated that pt is under the care of Dr. Gwenlyn Found, Cardiology. [name] denies that pt had a cardiac cath but stated that a stress test was performed in [place] 4 years ago. [name] stated that pt last dose of Coumadin was Thursday as instructed. [name] made aware to have pt stop taking Aspirin, vitamins, fish oil and herbal medications. Do not take any NSAIDs ie: Ibuprofen, Advil, Naproxen (Aleve), Motrin, BC and Goody Powder. [name] verbalized understanding of all pre-op instructions.

## 2017-04-26 ENCOUNTER — Encounter (HOSPITAL_COMMUNITY)
Admission: RE | Disposition: A | Discharge status: Home / Self Care | Payer: Self-pay | Source: Ambulatory Visit | Attending: General Surgery

## 2017-04-26 ENCOUNTER — Ambulatory Visit (HOSPITAL_BASED_OUTPATIENT_CLINIC_OR_DEPARTMENT_OTHER)
Admission: RE | Admit: 2017-04-26 | Discharge: 2017-04-26 | Disposition: A | Discharge status: Home / Self Care | Payer: Medicare HMO | Source: Ambulatory Visit | Attending: General Surgery | Admitting: General Surgery

## 2017-04-26 ENCOUNTER — Encounter (HOSPITAL_COMMUNITY): Payer: Self-pay | Admitting: *Deleted

## 2017-04-26 ENCOUNTER — Ambulatory Visit (HOSPITAL_COMMUNITY): Payer: Medicare HMO

## 2017-04-26 DIAGNOSIS — Z170 Estrogen receptor positive status [ER+]: Secondary | ICD-10-CM | POA: Insufficient documentation

## 2017-04-26 DIAGNOSIS — I10 Essential (primary) hypertension: Secondary | ICD-10-CM | POA: Insufficient documentation

## 2017-04-26 DIAGNOSIS — R51 Headache: Secondary | ICD-10-CM | POA: Diagnosis not present

## 2017-04-26 DIAGNOSIS — I4891 Unspecified atrial fibrillation: Secondary | ICD-10-CM | POA: Diagnosis not present

## 2017-04-26 DIAGNOSIS — Z791 Long term (current) use of non-steroidal anti-inflammatories (NSAID): Secondary | ICD-10-CM | POA: Insufficient documentation

## 2017-04-26 DIAGNOSIS — Z79899 Other long term (current) drug therapy: Secondary | ICD-10-CM | POA: Insufficient documentation

## 2017-04-26 DIAGNOSIS — Z880 Allergy status to penicillin: Secondary | ICD-10-CM | POA: Diagnosis not present

## 2017-04-26 DIAGNOSIS — N289 Disorder of kidney and ureter, unspecified: Secondary | ICD-10-CM | POA: Insufficient documentation

## 2017-04-26 DIAGNOSIS — K219 Gastro-esophageal reflux disease without esophagitis: Secondary | ICD-10-CM | POA: Insufficient documentation

## 2017-04-26 DIAGNOSIS — E039 Hypothyroidism, unspecified: Secondary | ICD-10-CM | POA: Diagnosis not present

## 2017-04-26 DIAGNOSIS — C50912 Malignant neoplasm of unspecified site of left female breast: Secondary | ICD-10-CM | POA: Diagnosis present

## 2017-04-26 DIAGNOSIS — C50412 Malignant neoplasm of upper-outer quadrant of left female breast: Secondary | ICD-10-CM | POA: Insufficient documentation

## 2017-04-26 DIAGNOSIS — M1990 Unspecified osteoarthritis, unspecified site: Secondary | ICD-10-CM | POA: Diagnosis not present

## 2017-04-26 HISTORY — PX: RE-EXCISION OF BREAST CANCER,SUPERIOR MARGINS: SHX6047

## 2017-04-26 LAB — CBC
HCT: 37.2 % (ref 36.0–46.0)
Hemoglobin: 11.5 g/dL — ABNORMAL LOW (ref 12.0–15.0)
MCH: 28.3 pg (ref 26.0–34.0)
MCHC: 30.9 g/dL (ref 30.0–36.0)
MCV: 91.6 fL (ref 78.0–100.0)
PLATELETS: 248 10*3/uL (ref 150–400)
RBC: 4.06 MIL/uL (ref 3.87–5.11)
RDW: 14.1 % (ref 11.5–15.5)
WBC: 6.6 10*3/uL (ref 4.0–10.5)

## 2017-04-26 LAB — BASIC METABOLIC PANEL
ANION GAP: 12 (ref 5–15)
BUN: 38 mg/dL — ABNORMAL HIGH (ref 6–20)
CO2: 19 mmol/L — ABNORMAL LOW (ref 22–32)
Calcium: 9.4 mg/dL (ref 8.9–10.3)
Chloride: 105 mmol/L (ref 101–111)
Creatinine, Ser: 1.85 mg/dL — ABNORMAL HIGH (ref 0.44–1.00)
GFR, EST AFRICAN AMERICAN: 30 mL/min — AB (ref >60)
GFR, EST NON AFRICAN AMERICAN: 26 mL/min — AB (ref >60)
Glucose, Bld: 109 mg/dL — ABNORMAL HIGH (ref 65–99)
POTASSIUM: 3.7 mmol/L (ref 3.5–5.1)
SODIUM: 136 mmol/L (ref 135–145)

## 2017-04-26 LAB — PROTIME-INR
INR: 1.29
Prothrombin Time: 16.2 seconds — ABNORMAL HIGH (ref 11.4–15.2)

## 2017-04-26 SURGERY — RE-EXCISION OF BREAST CANCER,SUPERIOR MARGINS
Anesthesia: General | Site: Breast | Laterality: Left

## 2017-04-26 MED ORDER — ACETAMINOPHEN 650 MG RE SUPP: 650.0000 mg | RECTAL | Status: DC | PRN

## 2017-04-26 MED ORDER — DEXMEDETOMIDINE HCL IN NACL 200 MCG/50ML IV SOLN
INTRAVENOUS | Status: AC
  Filled 2017-04-26: qty 50

## 2017-04-26 MED ORDER — ONDANSETRON HCL 4 MG/2ML IJ SOLN: 4.0000 mg | Freq: Four times a day (QID) | INTRAMUSCULAR | Status: DC | PRN

## 2017-04-26 MED ORDER — OXYCODONE HCL 5 MG PO TABS: 5.0000 mg | ORAL_TABLET | ORAL | Status: DC | PRN

## 2017-04-26 MED ORDER — PROPOFOL 10 MG/ML IV BOLUS
INTRAVENOUS | Status: AC
  Filled 2017-04-26: qty 20

## 2017-04-26 MED ORDER — PROPOFOL 10 MG/ML IV BOLUS
INTRAVENOUS | Status: DC | PRN
Start: 2017-04-26 — End: 2017-04-26
  Administered 2017-04-26: 150 mg via INTRAVENOUS

## 2017-04-26 MED ORDER — CHLORHEXIDINE GLUCONATE CLOTH 2 % EX PADS: 6.0000 | MEDICATED_PAD | Freq: Once | CUTANEOUS | Status: DC

## 2017-04-26 MED ORDER — SODIUM CHLORIDE 0.9% FLUSH: 3.0000 mL | INTRAVENOUS | Status: DC | PRN

## 2017-04-26 MED ORDER — FENTANYL CITRATE (PF) 100 MCG/2ML IJ SOLN: 25.0000 ug | INTRAMUSCULAR | Status: DC | PRN

## 2017-04-26 MED ORDER — LIDOCAINE HCL (CARDIAC) 20 MG/ML IV SOLN
INTRAVENOUS | Status: DC | PRN
  Administered 2017-04-26: 60 mg via INTRAVENOUS

## 2017-04-26 MED ORDER — LIDOCAINE HCL (PF) 1 % IJ SOLN
INTRAMUSCULAR | Status: AC
  Filled 2017-04-26: qty 30

## 2017-04-26 MED ORDER — OXYCODONE HCL 5 MG PO TABS: 5.0000 mg | ORAL_TABLET | Freq: Four times a day (QID) | ORAL | 0 refills | Status: DC | PRN

## 2017-04-26 MED ORDER — SODIUM CHLORIDE 0.9% FLUSH: 3.0000 mL | Freq: Two times a day (BID) | INTRAVENOUS | Status: DC

## 2017-04-26 MED ORDER — FENTANYL CITRATE (PF) 100 MCG/2ML IJ SOLN
INTRAMUSCULAR | Status: DC | PRN
  Administered 2017-04-26: 50 ug via INTRAVENOUS
  Administered 2017-04-26: 25 ug via INTRAVENOUS
  Administered 2017-04-26: 50 ug via INTRAVENOUS

## 2017-04-26 MED ORDER — DIPHENHYDRAMINE HCL 25 MG PO CAPS
25.0000 mg | ORAL_CAPSULE | ORAL | Status: AC
  Administered 2017-04-26: 25 mg via ORAL

## 2017-04-26 MED ORDER — LIDOCAINE HCL 1 % IJ SOLN
INTRAMUSCULAR | Status: DC | PRN
  Administered 2017-04-26 (×2): 10 mL via INTRAMUSCULAR

## 2017-04-26 MED ORDER — LIDOCAINE HCL 1 % IJ SOLN: INTRAMUSCULAR | Status: DC | PRN

## 2017-04-26 MED ORDER — MIDAZOLAM HCL 5 MG/5ML IJ SOLN
INTRAMUSCULAR | Status: DC | PRN
  Administered 2017-04-26: 2 mg via INTRAVENOUS

## 2017-04-26 MED ORDER — LACTATED RINGERS IV SOLN
INTRAVENOUS | Status: DC
  Administered 2017-04-26: 09:00:00 via INTRAVENOUS

## 2017-04-26 MED ORDER — SODIUM CHLORIDE 0.9 % IV SOLN: 250.0000 mL | INTRAVENOUS | Status: DC | PRN

## 2017-04-26 MED ORDER — OXYCODONE HCL 5 MG PO TABS: 5.0000 mg | ORAL_TABLET | Freq: Once | ORAL | Status: DC | PRN

## 2017-04-26 MED ORDER — FENTANYL CITRATE (PF) 250 MCG/5ML IJ SOLN
INTRAMUSCULAR | Status: AC
  Filled 2017-04-26: qty 5

## 2017-04-26 MED ORDER — ACETAMINOPHEN 325 MG PO TABS: 650.0000 mg | ORAL_TABLET | ORAL | Status: DC | PRN

## 2017-04-26 MED ORDER — DIPHENHYDRAMINE HCL 25 MG PO CAPS
ORAL_CAPSULE | ORAL | Status: AC
  Administered 2017-04-26: 25 mg via ORAL
  Filled 2017-04-26: qty 1

## 2017-04-26 MED ORDER — DEXAMETHASONE SODIUM PHOSPHATE 10 MG/ML IJ SOLN
INTRAMUSCULAR | Status: DC | PRN
  Administered 2017-04-26: 10 mg via INTRAVENOUS

## 2017-04-26 MED ORDER — BUPIVACAINE-EPINEPHRINE (PF) 0.25% -1:200000 IJ SOLN
INTRAMUSCULAR | Status: AC
  Filled 2017-04-26: qty 30

## 2017-04-26 MED ORDER — PHENYLEPHRINE HCL 10 MG/ML IJ SOLN
INTRAMUSCULAR | Status: DC | PRN
  Administered 2017-04-26 (×3): 40 ug via INTRAVENOUS
  Administered 2017-04-26: 80 ug via INTRAVENOUS

## 2017-04-26 MED ORDER — OXYCODONE HCL 5 MG/5ML PO SOLN: 5.0000 mg | Freq: Once | ORAL | Status: DC | PRN

## 2017-04-26 MED ORDER — MIDAZOLAM HCL 2 MG/2ML IJ SOLN
INTRAMUSCULAR | Status: AC
  Filled 2017-04-26: qty 2

## 2017-04-26 MED ORDER — ONDANSETRON HCL 4 MG/2ML IJ SOLN
INTRAMUSCULAR | Status: DC | PRN
  Administered 2017-04-26: 4 mg via INTRAVENOUS

## 2017-04-26 SURGICAL SUPPLY — 47 items
BINDER BREAST LRG (GAUZE/BANDAGES/DRESSINGS) IMPLANT
BINDER BREAST XLRG (GAUZE/BANDAGES/DRESSINGS) ×2 IMPLANT
BLADE SURG 10 STRL SS (BLADE) ×2 IMPLANT
CANISTER SUCT 3000ML PPV (MISCELLANEOUS) ×2 IMPLANT
CHLORAPREP W/TINT 26ML (MISCELLANEOUS) ×2 IMPLANT
CLIP TI LARGE 6 (CLIP) IMPLANT
CONT SPEC 4OZ CLIKSEAL STRL BL (MISCELLANEOUS) ×2 IMPLANT
COVER SURGICAL LIGHT HANDLE (MISCELLANEOUS) ×2 IMPLANT
DERMABOND ADVANCED (GAUZE/BANDAGES/DRESSINGS) ×1
DERMABOND ADVANCED .7 DNX12 (GAUZE/BANDAGES/DRESSINGS) ×1 IMPLANT
DRAPE CHEST BREAST 15X10 FENES (DRAPES) ×2 IMPLANT
DRAPE UTILITY XL STRL (DRAPES) ×2 IMPLANT
DRSG PAD ABDOMINAL 8X10 ST (GAUZE/BANDAGES/DRESSINGS) ×2 IMPLANT
ELECT CAUTERY BLADE 6.4 (BLADE) ×2 IMPLANT
ELECT REM PT RETURN 9FT ADLT (ELECTROSURGICAL) ×2
ELECTRODE REM PT RTRN 9FT ADLT (ELECTROSURGICAL) ×1 IMPLANT
GAUZE SPONGE 4X4 12PLY STRL (GAUZE/BANDAGES/DRESSINGS) ×2 IMPLANT
GLOVE BIO SURGEON STRL SZ 6 (GLOVE) ×2 IMPLANT
GLOVE BIOGEL PI IND STRL 6.5 (GLOVE) ×1 IMPLANT
GLOVE BIOGEL PI INDICATOR 6.5 (GLOVE) ×1
GLOVE ECLIPSE 6.0 STRL STRAW (GLOVE) ×2 IMPLANT
GOWN STRL REUS W/ TWL LRG LVL3 (GOWN DISPOSABLE) ×1 IMPLANT
GOWN STRL REUS W/TWL 2XL LVL3 (GOWN DISPOSABLE) ×2 IMPLANT
GOWN STRL REUS W/TWL LRG LVL3 (GOWN DISPOSABLE) ×1
ILLUMINATOR WAVEGUIDE N/F (MISCELLANEOUS) IMPLANT
KIT BASIN OR (CUSTOM PROCEDURE TRAY) ×2 IMPLANT
KIT MARKER MARGIN INK (KITS) IMPLANT
KIT ROOM TURNOVER OR (KITS) ×2 IMPLANT
LIGHT WAVEGUIDE WIDE FLAT (MISCELLANEOUS) ×2 IMPLANT
NEEDLE HYPO 25GX1X1/2 BEV (NEEDLE) ×2 IMPLANT
NS IRRIG 1000ML POUR BTL (IV SOLUTION) ×2 IMPLANT
PACK SURGICAL SETUP 50X90 (CUSTOM PROCEDURE TRAY) ×2 IMPLANT
PAD ARMBOARD 7.5X6 YLW CONV (MISCELLANEOUS) ×2 IMPLANT
PENCIL BUTTON HOLSTER BLD 10FT (ELECTRODE) ×2 IMPLANT
SLEEVE SCD COMPRESS KNEE XLRG (MISCELLANEOUS) ×2 IMPLANT
SPONGE LAP 18X18 X RAY DECT (DISPOSABLE) ×2 IMPLANT
STRIP CLOSURE SKIN 1/2X4 (GAUZE/BANDAGES/DRESSINGS) ×2 IMPLANT
SUT MNCRL AB 4-0 PS2 18 (SUTURE) ×2 IMPLANT
SUT SILK 2 0 FS (SUTURE) IMPLANT
SUT VIC AB 3-0 SH 27 (SUTURE) ×1
SUT VIC AB 3-0 SH 27X BRD (SUTURE) ×1 IMPLANT
SYR BULB 3OZ (MISCELLANEOUS) ×2 IMPLANT
SYR CONTROL 10ML LL (SYRINGE) ×2 IMPLANT
TOWEL OR 17X24 6PK STRL BLUE (TOWEL DISPOSABLE) ×2 IMPLANT
TOWEL OR 17X26 10 PK STRL BLUE (TOWEL DISPOSABLE) ×2 IMPLANT
TUBE CONNECTING 12X1/4 (SUCTIONS) ×2 IMPLANT
YANKAUER SUCT BULB TIP NO VENT (SUCTIONS) ×2 IMPLANT

## 2017-04-26 NOTE — Transfer of Care (Signed)
Immediate Anesthesia Transfer of Care Note  Patient: Katelyn Jackson  Procedure(s) Performed: Procedure(s): RE-EXCISION OF LEFT BREAST CANCER (Left)  Patient Location: PACU  Anesthesia Type:General  Level of Consciousness: awake, alert  and oriented  Airway & Oxygen Therapy: Patient Spontanous Breathing and Patient connected to nasal cannula oxygen  Post-op Assessment: Report given to RN, Post -op Vital signs reviewed and stable and Patient moving all extremities X 4  Post vital signs: Reviewed and stable  Last Vitals:  Vitals:   04/26/17 0850 04/26/17 1119  BP:  131/83  Pulse: 70 78  Resp:  15  Temp: 37 C 36.4 C    Last Pain:  Vitals:   04/26/17 0828  TempSrc: (P) Oral         Complications: No apparent anesthesia complications

## 2017-04-26 NOTE — Op Note (Signed)
Re-excisional Left Breast Lumpectomy   Indications: This patient presents with history of close margins for DCIS after partial mastectomy for left breast cancer   Pre-operative Diagnosis: Left breast cancer, multifocal  Post-operative Diagnosis: left breast cancer   Surgeon: [name]   Assistants: n/a   Anesthesia: General anesthesia and Local anesthesia   ASA Class: 3   Procedure Details  The patient was seen in the Holding Room. The risks, benefits, complications, treatment options, and expected outcomes were discussed with the patient. The possibilities of reaction to medication, pulmonary aspiration, bleeding, infection, the need for additional procedures, failure to diagnose a condition, and creating a complication requiring transfusion or operation were discussed with the patient. The patient concurred with the proposed plan, giving informed consent. The site of surgery properly noted/marked. The patient was taken to Operating Room # 8, identified, and the procedure verified as re-excision of left breast cancer.  After induction of anesthesia, the left breast and chest were prepped and draped in standard fashion.  The lumpectomy was performed by reopening the prior incision. Seroma was aspirated. The mastopexy sutures were removed. Additional margins were taken at medial borders of the partial mastectomy cavity. Dissection was carried down to the pectoral fascia. Orientation sutures were placed in the specimens. Hemostasis was achieved with cautery. The wound was irrigated and closed with a 3-0 Vicryl deep dermal interrupted and a 4-0 Monocryl subcuticular closure in layers.  Sterile dressings were applied. At the end of the operation, all sponge, instrument, and needle counts were correct.   Findings:  grossly clear surgical margins  Estimated Blood Loss: Minimal   Drains: none   Specimens: additional Medial margins.   Complications: None; patient tolerated the procedure  well.   Disposition: PACU - hemodynamically stable.   Condition: stable

## 2017-04-26 NOTE — Interval H&P Note (Signed)
History and Physical Interval Note:  04/26/2017 8:38 AM  Katelyn Jackson  has presented today for surgery, with the diagnosis of LEFT BREAST CANCER  The various methods of treatment have been discussed with the patient and family. After consideration of risks, benefits and other options for treatment, the patient has consented to  Procedure(s): RE-EXCISION OF LEFT BREAST CANCER (Left) as a surgical intervention .  The patient's history has been reviewed, patient examined, no change in status, stable for surgery.  I have reviewed the patient's chart and labs.  Questions were answered to the patient's satisfaction.     [name]

## 2017-04-26 NOTE — Discharge Instructions (Addendum)
[facility] °Office Phone Number [phone] ° °BREAST BIOPSY/ PARTIAL MASTECTOMY: POST OP INSTRUCTIONS ° °Always review your discharge instruction sheet given to you by the facility where your surgery was performed. ° °IF YOU HAVE DISABILITY OR FAMILY LEAVE FORMS, YOU MUST BRING THEM TO THE OFFICE FOR PROCESSING.  DO NOT GIVE THEM TO YOUR DOCTOR. ° °1. A prescription for pain medication may be given to you upon discharge.  Take your pain medication as prescribed, if needed.  If narcotic pain medicine is not needed, then you may take acetaminophen (Tylenol) or ibuprofen (Advil) as needed. °2. Take your usually prescribed medications unless otherwise directed °3. If you need a refill on your pain medication, please contact your pharmacy.  They will contact our office to request authorization.  Prescriptions will not be filled after 5pm or on week-ends. °4. You should eat very light the first 24 hours after surgery, such as soup, crackers, pudding, etc.  Resume your normal diet the day after surgery. °5. Most patients will experience some swelling and bruising in the breast.  Ice packs and a good support bra will help.  Swelling and bruising can take several days to resolve.  °6. It is common to experience some constipation if taking pain medication after surgery.  Increasing fluid intake and taking a stool softener will usually help or prevent this problem from occurring.  A mild laxative (Milk of Magnesia or Miralax) should be taken according to package directions if there are no bowel movements after 48 hours. °7. Unless discharge instructions indicate otherwise, you may remove your bandages 48 hours after surgery, and you may shower at that time.  You may have steri-strips (small skin tapes) in place directly over the incision.  These strips should be left on the skin for 7-10 days.   Any sutures or staples will be removed at the office during your follow-up visit. °8. ACTIVITIES:  You may resume  regular daily activities (gradually increasing) beginning the next day.  Wearing a good support bra or sports bra (or the breast binder) minimizes pain and swelling.  You may have sexual intercourse when it is comfortable. °a. You may drive when you no longer are taking prescription pain medication, you can comfortably wear a seatbelt, and you can safely maneuver your car and apply brakes. °b. RETURN TO WORK:  __________1 week_______________ °9. You should see your doctor in the office for a follow-up appointment approximately two weeks after your surgery.  Your doctor’s nurse will typically make your follow-up appointment when she calls you with your pathology report.  Expect your pathology report 2-3 business days after your surgery.  You may call to check if you do not hear from us after three days. ° ° °WHEN TO CALL YOUR DOCTOR: °1. Fever over 101.0 °2. Nausea and/or vomiting. °3. Extreme swelling or bruising. °4. Continued bleeding from incision. °5. Increased pain, redness, or drainage from the incision. ° °The clinic staff is available to answer your questions during regular business hours.  Please don’t hesitate to call and ask to speak to one of the nurses for clinical concerns.  If you have a medical emergency, go to the nearest emergency room or call 911.  A surgeon from [facility] is always on call at the hospital. ° °For further questions, please visit [email or web address]  ° °

## 2017-04-26 NOTE — Anesthesia Preprocedure Evaluation (Addendum)
Anesthesia Evaluation  Patient identified by MRN, date of birth, ID band Patient awake    Reviewed: Allergy & Precautions, H&P , NPO status , Patient's Chart, lab work & pertinent test results  Airway Mallampati: II   Neck ROM: full    Dental   Pulmonary shortness of breath,    breath sounds clear to auscultation       Cardiovascular hypertension, + dysrhythmias Atrial Fibrillation  Rhythm:irregular Rate:Normal     Neuro/Psych  Headaches,    GI/Hepatic GERD  ,  Endo/Other  Hypothyroidism   Renal/GU Renal InsufficiencyRenal disease     Musculoskeletal  (+) Arthritis ,   Abdominal   Peds  Hematology   Anesthesia Other Findings   Reproductive/Obstetrics                            Anesthesia Physical Anesthesia Plan  ASA: III  Anesthesia Plan: General   Post-op Pain Management:    Induction: Intravenous  PONV Risk Score and Plan: 3 and Ondansetron, Dexamethasone, Propofol, Midazolam and Treatment may vary due to age or medical condition  Airway Management Planned: LMA  Additional Equipment:   Intra-op Plan:   Post-operative Plan:   Informed Consent: I have reviewed the patients History and Physical, chart, labs and discussed the procedure including the risks, benefits and alternatives for the proposed anesthesia with the patient or authorized representative who has indicated his/her understanding and acceptance.     Plan Discussed with: CRNA, Anesthesiologist and Surgeon  Anesthesia Plan Comments:         Anesthesia Quick Evaluation

## 2017-04-26 NOTE — H&P (Signed)
Katelyn Jackson 04/24/2017 11:41 AM Location: [facility] Patient #: [id number] DOB: 06-10-1946 Widowed / Language: Katelyn Jackson / Race: White Female   History of Present Illness Katelyn Klein MD; 04/24/2017 12:32 PM) The patient is a 71 year old female who presents for a follow-up for Breast cancer. Patient is a 71 year old female who presents with a new diagnosis of left breast cancer. The patient was found to have screening detected left breast asymmetry. Diagnostic imaging was performed and demonstrated a 6 mm area of concern at 1:00, a 4 mm area of concern at 3:00, and an additional area of concern at 2:30. The 1:00 and 3:00 locations were biopsied and both were grade 1 invasive ductal carcinoma. Prognostic panel showed ER and PR positive, HER-2 negative, and Ki-67 was 10%.  The patient got a third biopsy at 2:30 as she is unable to get an MRI due to her creatinine clearance. This also was positive for invasive ductal carcinoma.   She underwent bracketed lumpectomy with 3 wires 04/04/2017. Margins were negative, but medial one was <1 mm for DCIS. She is doing well. she is not taking pain meds. She does feel some heaviness.  pathology Diagnosis 1. Breast, lumpectomy, Left - MULTIFOCAL INVASIVE DUCTAL CARCINOMA, NOTTINGHAM GRADE 2 OF 3, 0.9 CM - DUCTAL CARCINOMA IN SITU, LOW GRADE (<1MM FROM MEDIAL MARGIN) - FIBROCYSTIC AND COLUMNAR CELL CHANGE - CALCIFICATIONS ASSOCIATED WITH CARCINOMA AND BENIGN DISEASE - MARGINS UNINVOLVED BY CARCINOMA - PREVIOUS BIOPSY SITE CHANGES - SEE ONCOLOGY TABLE BELOW 2. Lymph node, sentinel, biopsy, Left Axillary #1 - NO CARCINOMA IDENTIFIED IN ONE LYMPH NODE (0/1) 3. Lymph node, sentinel, biopsy, Left Axillary #2 - NO CARCINOMA IDENTIFIED IN ONE LYMPH NODE (0/1)   Allergies (Katelyn Jackson, CMA; 04/24/2017 11:57 AM) Penicillin G Potassium *PENICILLINS*   Medication History (Katelyn Jackson, CMA; 04/24/2017 11:58 AM) Creon (12000UNIT  Capsule DR Part, Oral) Active. Durezol (0.05% Emulsion, Ophthalmic) Active. Diclofenac Sodium (1% Gel, Transdermal) Active. Besivance (0.6% Suspension, Ophthalmic) Active. Furosemide (40MG Tablet, Oral) Active. Latanoprost (0.005% Solution, Ophthalmic) Active. Levothyroxine Sodium (50MCG Tablet, Oral) Active. Lovastatin (20MG Tablet, Oral) Active. Omeprazole (40MG Capsule DR, Oral) Active. Polyethylene Glycol 3350 (Oral) Active. TiZANidine HCl (4MG Tablet, Oral) Active. Uloric (40MG Tablet, Oral) Active. Valsartan-Hydrochlorothiazide (320-25MG Tablet, Oral) Active. Warfarin Sodium (5MG Tablet, Oral) Active. Ketorolac Tromethamine (0.4% Solution, Ophthalmic) Active. Potassium Chloride (20MEQ Tablet ER, Oral) Active. Medications Reconciled    Review of Systems Katelyn Klein MD; 04/24/2017 12:32 PM) All other systems negative  Vitals (Katelyn Jackson Doniphan; 04/24/2017 11:58 AM) 04/24/2017 11:58 AM Weight: 277.38 lb Height: 62in Body Surface Area: 2.2 m Body Mass Index: 50.73 kg/m  Temp.: 98.60F(Oral)  Pulse: 90 (Regular)  BP: 120/80 (Sitting, Left Arm, Standard)       Physical Exam Katelyn Klein MD; 04/24/2017 12:33 PM) Breast Note: Left breast has some bruising and a moderate to large seroma. No evidence of infection.     Assessment & Plan Katelyn Klein MD; 04/24/2017 12:33 PM) MALIGNANT NEOPLASM OF UPPER-OUTER QUADRANT OF LEFT BREAST IN FEMALE, ESTROGEN RECEPTOR POSITIVE (C50.412) Impression: Reexcision is already scheduled for Wednesday. She is holding her Coumadin.  I reviewed what would happen that day. She has a trip planned to [place] at the end of August.  I will see her in 2 days. Current Plans Follow up with [place] in the office in 2 weeks.  Call us sooner as needed.    Signed by Katelyn Klein, MD (04/24/2017 12:34 PM)

## 2017-04-26 NOTE — Anesthesia Procedure Notes (Signed)
Date/Time: 04/26/2017 10:28 AM Performed by: Merrilyn Puma B Pre-anesthesia Checklist: Patient identified, Emergency Drugs available, Suction available, Patient being monitored and Timeout performed Patient Re-evaluated:Patient Re-evaluated prior to inductionOxygen Delivery Method: Circle system utilized Preoxygenation: Pre-oxygenation with 100% oxygen Ventilation: Mask ventilation without difficulty LMA: LMA inserted LMA Size: 4.0 Number of attempts: 1 Placement Confirmation: positive ETCO2,  breath sounds checked- equal and bilateral and CO2 detector Tube secured with: Tape Dental Injury: Teeth and Oropharynx as per pre-operative assessment

## 2017-04-26 NOTE — Anesthesia Postprocedure Evaluation (Signed)
Anesthesia Post Note  Patient: Katelyn Jackson  Procedure(s) Performed: Procedure(s) (LRB): RE-EXCISION OF LEFT BREAST CANCER (Left)     Patient location during evaluation: PACU Anesthesia Type: General Level of consciousness: awake and alert and patient cooperative Pain management: pain level controlled Vital Signs Assessment: post-procedure vital signs reviewed and stable Respiratory status: spontaneous breathing and respiratory function stable Cardiovascular status: stable Anesthetic complications: no    Last Vitals:  Vitals:   04/26/17 1119 04/26/17 1130  BP: 131/83 (!) 150/92  Pulse: 78 93  Resp: 15 20  Temp: 36.4 C 36.7 C    Last Pain:  Vitals:   04/26/17 1135  TempSrc:   PainSc: 2                  Dorota Heinrichs S

## 2017-04-27 ENCOUNTER — Encounter (HOSPITAL_COMMUNITY): Payer: Self-pay | Admitting: General Surgery

## 2017-04-27 NOTE — Progress Notes (Signed)
Please let patient know margins is negative!  No more surgery.

## 2017-05-11 NOTE — Progress Notes (Addendum)
Location of Breast Cancer: Upper-outer quadrant of left breast in female  Histology per Pathology Report:   Diagnosis 04-26-17 Breast, excision, Left additional Medial Margin - FIBROCBreast, left, needle core biopsy,YSTIC CHANGES INCLUDING APOCRINE METAPLASIA - PREVIOUS SURGICAL SITE CHANGE - NO CARCINOMA IDENTIFIED   Diagnosis 04-04-17 1. Breast, lumpectomy, Left - MULTIFOCAL INVASIVE DUCTAL CARCINOMA, NOTTINGHAM GRADE 2 OF 3, 0.9 CM - DUCTAL CARCINOMA IN SITU, LOW GRADE (<1MM FROM MEDIAL MARGIN) - FIBROCYSTIC AND COLUMNAR CELL CHANGE - CALCIFICATIONS ASSOCIATED WITH CARCINOMA AND BENIGN DISEASECARCINOMA   - MARGINS UNINVOLVED BY - PREVIOUS BIOPSY SITE CHANGES - SEE ONCOLOGY TABLE BELOW 2. Lymph node, sentinel, biopsy, Left Axillary #1 - NO CARCINOMA IDENTIFIED IN ONE LYMPH NODE (0/1) 3. Lymph node, sentinel, biopsy, Left Axillary #2 - NO CARCINOMA IDENTIFIED IN ONE LYMPH NODE (0/1)  Diagnosis 03-01-17 Breast, left, needle core biopsy, 2:30 o'clock INVASIVE DUCTAL CARCINOMA, GRADE 1  Diagnosis 02-06-17 1. Breast, left, needle core biopsy, 1:00 o'clock, 7 CMFN - INVASIVE DUCTAL CARCINOMA, SEE COMMENT. - DUCTAL CARCINOMA IN SITU. 2. Breast, left, needle core biopsy, 3:00 o'clock, 7 CMFN - INVASIVE DUCTAL CARCINOMA, SEE COMMENT. - DUCTAL CARCINOMA IN SITU.  Receptor Status: ER(100%+), PR (100% =), Her2-neu (neg), Ki-(10%)  Did patient present with symptoms (if so, please note symptoms) or was this found on screening mammography?: routine screening   Past/Anticipated interventions by surgeon, if any:04-26-17 Diagnosis 04-26-17 Dr. [name]  Breast, excision, Left additional Medial Margin, Dr. Docia Barrier 04-04-17 Breast, lumpectomy, Lef Past/Anticipated interventions by medical oncology, if any: Chemotherapy     02-06-17 Breast, left, needle core biopsy,  Lymphedema issues, if any: No  Pain issues, if any: No 05-15-17 Saw Dr [name]  for redness of left breast  gave antibiotic for 7 days tid, also drained fluid from the left breast no warmth to her breast per son and patient.  SAFETY ISSUES:  Prior radiation? : No  Pacemaker/ICD? : No  Possible current pregnancy? : No  Is the patient on methotrexate? : No Menarchal: 13 LMP: 55 Contraceptive: Post-menopause HRT: No GP: G2P1 Current Complaints / other details: 71 y.o. widowed woman no family history of cancer, Hx. Chronic Atrial fibrillation Wt Readings from Last 3 Encounters:  05/17/17 269 lb 3.2 oz (122.1 kg)  04/26/17 277 lb (125.6 kg)  04/04/17 277 lb (125.6 kg)  BP (!) 149/78   Pulse (!) 101   Temp 98.2 F (36.8 C) (Oral)   Resp 20   Ht (!) 5" (0.127 m)   Wt 269 lb 3.2 oz (122.1 kg)   LMP  (LMP Unknown)   SpO2 99%   BMI 7570.73 kg/m    Georgena Spurling, RN 05/11/2017,12:47 PM

## 2017-05-17 ENCOUNTER — Encounter: Payer: Self-pay | Admitting: Radiation Oncology

## 2017-05-17 ENCOUNTER — Ambulatory Visit
Admission: RE | Admit: 2017-05-17 | Discharge: 2017-05-17 | Disposition: A | Discharge status: Home / Self Care | Payer: Medicare HMO | Source: Ambulatory Visit | Attending: Radiation Oncology | Admitting: Radiation Oncology

## 2017-05-17 VITALS — BP 149/78 | HR 101 | Temp 98.2°F | Resp 20 | Ht <= 58 in | Wt 269.2 lb

## 2017-05-17 DIAGNOSIS — I272 Pulmonary hypertension, unspecified: Secondary | ICD-10-CM | POA: Insufficient documentation

## 2017-05-17 DIAGNOSIS — I10 Essential (primary) hypertension: Secondary | ICD-10-CM | POA: Insufficient documentation

## 2017-05-17 DIAGNOSIS — I482 Chronic atrial fibrillation: Secondary | ICD-10-CM | POA: Insufficient documentation

## 2017-05-17 DIAGNOSIS — Z79891 Long term (current) use of opiate analgesic: Secondary | ICD-10-CM | POA: Insufficient documentation

## 2017-05-17 DIAGNOSIS — Z170 Estrogen receptor positive status [ER+]: Principal | ICD-10-CM

## 2017-05-17 DIAGNOSIS — Z9889 Other specified postprocedural states: Secondary | ICD-10-CM | POA: Diagnosis not present

## 2017-05-17 DIAGNOSIS — I890 Lymphedema, not elsewhere classified: Secondary | ICD-10-CM | POA: Diagnosis not present

## 2017-05-17 DIAGNOSIS — I129 Hypertensive chronic kidney disease with stage 1 through stage 4 chronic kidney disease, or unspecified chronic kidney disease: Secondary | ICD-10-CM | POA: Insufficient documentation

## 2017-05-17 DIAGNOSIS — Z7901 Long term (current) use of anticoagulants: Secondary | ICD-10-CM | POA: Diagnosis not present

## 2017-05-17 DIAGNOSIS — Z79899 Other long term (current) drug therapy: Secondary | ICD-10-CM | POA: Insufficient documentation

## 2017-05-17 DIAGNOSIS — Z8249 Family history of ischemic heart disease and other diseases of the circulatory system: Secondary | ICD-10-CM | POA: Diagnosis not present

## 2017-05-17 DIAGNOSIS — K219 Gastro-esophageal reflux disease without esophagitis: Secondary | ICD-10-CM | POA: Insufficient documentation

## 2017-05-17 DIAGNOSIS — C50412 Malignant neoplasm of upper-outer quadrant of left female breast: Secondary | ICD-10-CM | POA: Diagnosis not present

## 2017-05-17 DIAGNOSIS — Z510 Encounter for antineoplastic radiation therapy: Secondary | ICD-10-CM | POA: Diagnosis present

## 2017-05-17 DIAGNOSIS — E669 Obesity, unspecified: Secondary | ICD-10-CM | POA: Diagnosis not present

## 2017-05-17 DIAGNOSIS — N189 Chronic kidney disease, unspecified: Secondary | ICD-10-CM | POA: Insufficient documentation

## 2017-05-17 DIAGNOSIS — M1990 Unspecified osteoarthritis, unspecified site: Secondary | ICD-10-CM | POA: Insufficient documentation

## 2017-05-17 DIAGNOSIS — N611 Abscess of the breast and nipple: Secondary | ICD-10-CM | POA: Diagnosis not present

## 2017-05-18 NOTE — Progress Notes (Signed)
Radiation Oncology         [phone] ________________________________  Name: Katelyn Jackson MRN: [id number]  Date: 05/17/2017  DOB: 1945/11/17  CC:Sun, [name], MD  Truitt Merle, MD     REFERRING PHYSICIAN: Truitt Merle, MD   DIAGNOSIS: The encounter diagnosis was Malignant neoplasm of upper-outer quadrant of left breast in female, estrogen receptor positive ([name]).   HISTORY OF PRESENT ILLNESS: Katelyn Jackson is a 71 y.o. female originally seen in the multidisciplinary breast clinic for a new diagnosis of left breast cancer. The patient reports a history of normal mammography and went for routine screening which detected an architectural assymmetry. She underwent diagnostic imaging and an ultrasound revealed a 4 x 4 x 3 mm lesion at 3 o'clock, and a 6 x4 x3 mm at 1 o'clock, both of which were biopsied. There was also another lesion seen at 2:30 position which did not have a measurement documented. She did not have any adenopathy noted in the axilla on the left. She had a biopsy of the 1 and 3 o'clock lesions on 02/06/17, and both biopsies revealed grade 1 invasive ductal carcinoma and DCIS, ER/PR positive, HER2 negative with Ki 67 of 10%.   She underwent left lumpectomy with sentinel node evaluation on 04/04/17 which revealed a 9 mm, grade 2 invasive ductal carcinoma with low grade DCIS, <14m from the medial margin, and two negative notes. She subsequently underwent re-resection given the margin on 04/26/17 which did not reveal residual DCIS or invasive disease. Her oncotype score was 18, and she is not proceeding with chemotherapy. She was seen last Friday with Dr. BBarry Dienesand purulent material was aspirated and she's been on antibiotics since. She comes today to review the role of adjuvant radiotherapy.    PREVIOUS RADIATION THERAPY: No   PAST MEDICAL HISTORY:  Past Medical History:  Diagnosis Date  . Arthritis   . Cancer ([facility]    breast cancer  . Chronic atrial fibrillation ([place]   .  Chronic kidney disease    sees Dr PFlorene Glen . Cramps, muscle, general   . Dyspnea   . Dyspnea on exertion   . Dysrhythmia   . GERD (gastroesophageal reflux disease)   . Headache   . Hyperlipidemia   . Hypertension   . Hypothyroidism   . Lymphedema   . Moderate to severe pulmonary hypertension ([place]   . Obesity        PAST SURGICAL HISTORY: Past Surgical History:  Procedure Laterality Date  . APPENDECTOMY    . bilateral eye surgery    . BREAST LUMPECTOMY WITH NEEDLE LOCALIZATION AND AXILLARY SENTINEL LYMPH NODE BX Left 04/04/2017   Procedure: BREAST LUMPECTOMY WITH NEEDLE LOCALIZATION x3 AND AXILLARY SENTINEL LYMPH NODE BX;  Surgeon: BStark Klein MD;  [place]  Service: General;  Laterality: Left;  . COLONOSCOPY    . RE-EXCISION OF BREAST CANCER,SUPERIOR MARGINS Left 04/26/2017   Procedure: RE-EXCISION OF LEFT BREAST CANCER;  Surgeon: BStark Klein MD;  [place]  Service: General;  Laterality: Left;     FAMILY HISTORY:  Family History  Problem Relation Age of Onset  . CAD Mother   . Cancer Neg Hx      SOCIAL HISTORY:  reports that she has never smoked. She has never used smokeless tobacco. She reports that she does not drink alcohol or use drugs. The 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 She enjoys cooking and playing with her terrier.    ALLERGIES: Chlorhexidine gluconate and Penicillins   MEDICATIONS:  Current Outpatient Prescriptions  Medication Sig Dispense Refill  . febuxostat (ULORIC) 40 MG tablet Take 40 mg by mouth daily.    . furosemide (LASIX) 40 MG tablet Take 40 mg by mouth 2 (two) times daily.     [name] KLOR-CON M20 20 MEQ tablet Take 20 mEq by mouth daily.     [name] levothyroxine (SYNTHROID, LEVOTHROID) 50 MCG tablet Take 50 mcg by mouth daily before breakfast.     . lipase/protease/amylase (CREON) 12000  units CPEP capsule Take 12,000 Units by mouth daily as needed (stomach problems).     . lovastatin (MEVACOR) 20 MG tablet Take 20 mg by mouth daily.     [name] omeprazole (PRILOSEC) 40 MG capsule Take 40 mg by mouth daily as needed (acid reflux).     . polyethylene glycol powder (GLYCOLAX/MIRALAX) powder Take 17 g by mouth at bedtime.     . valsartan-hydrochlorothiazide (DIOVAN-HCT) 320-25 MG tablet Take 1 tablet by mouth daily.     . VOLTAREN 1 % GEL Apply 2 g topically 4 (four) times daily as needed (pain).     [name] warfarin (COUMADIN) 5 MG tablet Take 5 mg by mouth daily.     [name] oxyCODONE (OXY IR/ROXICODONE) 5 MG immediate release tablet Take 1-2 tablets (5-10 mg total) by mouth every 6 (six) hours as needed for moderate pain, severe pain or breakthrough pain. (Patient not taking: Reported on 05/17/2017) 30 tablet 0  . tiZANidine (ZANAFLEX) 4 MG tablet Take 4 mg by mouth daily as needed for muscle spasms.     No current facility-administered medications for this encounter.      REVIEW OF SYSTEMS: On review of systems, the patient reports that she is doing well overall. Her left breast is warm and red but she denies pain in this site. She denies any chest pain, shortness of breath, cough, fevers, chills, night sweats, unintended weight changes. She denies any bowel or bladder disturbances, and denies abdominal pain, nausea or vomiting. She denies any new musculoskeletal or joint aches or pains. A complete review of systems is obtained and is otherwise negative.     PHYSICAL EXAM:  Wt Readings from Last 3 Encounters:  05/17/17 269 lb 3.2 oz (122.1 kg)  04/26/17 277 lb (125.6 kg)  04/04/17 277 lb (125.6 kg)   Temp Readings from Last 3 Encounters:  05/17/17 98.2 F (36.8 C) (Oral)  04/26/17 98 F (36.7 C)  04/04/17 97.6 F (36.4 C)   BP Readings from Last 3 Encounters:  05/17/17 (!) 149/78  04/26/17 (!) 150/92  04/04/17 126/82   Pulse Readings from Last 3 Encounters:  05/17/17 (!) 101    04/26/17 93  04/04/17 77   Pain Assessment Pain Score: 0-No pain  In general this is a well appearing caucasian female in no acute distress. She's alert and oriented x4 and appropriate throughout the examination. Cardiopulmonary assessment is negative for acute distress and she exhibits normal effort. The left breast is warm and erythematous and the erythema involves the lower 2/3 of the breast without punctate lesions or drainage.   ECOG = 0  0 - Asymptomatic (Fully active, able to carry on all predisease activities without restriction)  1 - Symptomatic but completely ambulatory (Restricted in physically strenuous activity but ambulatory and able to carry out work of a light or sedentary nature. For  example, light housework, office work)  2 - Symptomatic, <50% in bed during the day (Ambulatory and capable of all self care but unable to carry out any work activities. Up and about more than 50% of waking hours)  3 - Symptomatic, >50% in bed, but not bedbound (Capable of only limited self-care, confined to bed or chair 50% or more of waking hours)  4 - Bedbound (Completely disabled. Cannot carry on any self-care. Totally confined to bed or chair)  5 - Death   Eustace Pen MM, Creech RH, Tormey DC, et al. [phone]). "Toxicity and response criteria of the [facility]". [place] Oncol. 5 (6): 649-55    LABORATORY DATA:  Lab Results  Component Value Date   WBC 6.6 04/26/2017   HGB 11.5 (L) 04/26/2017   HCT 37.2 04/26/2017   MCV 91.6 04/26/2017   PLT 248 04/26/2017   Lab Results  Component Value Date   NA 136 04/26/2017   K 3.7 04/26/2017   CL 105 04/26/2017   CO2 19 (L) 04/26/2017   Lab Results  Component Value Date   ALT 15 02/15/2017   AST 16 02/15/2017   ALKPHOS 92 02/15/2017   BILITOT 0.40 02/15/2017      RADIOGRAPHY: No results found.     IMPRESSION/PLAN: 1. Stage IA, [id number] grade 2, ER/PR positive  invasive ductal carcinoma and DCIS of the  left breast. Dr. Lisbeth Renshaw discusses the pathology findings and reviews the nature of invasive and in situ disease. We reviewed her final pathology and her oncotype score which does not indicate a role for chemotherapy. Dr. Lisbeth Renshaw recommends adjuvant radiotherapy, and would offer her 4 weeks of treatment. We discussed the risks, benefits, short, and long term effects of radiotherapy, and the patient is interested in proceeding. Dr. Lisbeth Renshaw discusses the delivery and logistics of radiotherapy with deep inspiration breath hold technique. We will plan to simulate the week of 7/25 and proceed the following week provided her infection has resolved. 2. Left breast abscess. The patient will continue oral antibiotics per Dr. Barry Dienes and is cautioned to follow up with Dr. Barry Dienes if her symptoms do not improve.   In a visit lasting 35 minutes, greater than 50% of the time was spent face to face discussing her postoperative course, and coordinating the patient's care.  The above documentation reflects my direct findings during this shared patient visit. Please see the separate note by Dr. Lisbeth Renshaw on this date for the remainder of the patient's plan of care.    Carola Rhine, PAC

## 2017-05-29 ENCOUNTER — Emergency Department (HOSPITAL_COMMUNITY)
Admission: EM | Admit: 2017-05-29 | Discharge: 2017-05-29 | Disposition: A | Discharge status: Home / Self Care | Payer: Medicare HMO | Attending: Emergency Medicine | Admitting: Emergency Medicine

## 2017-05-29 ENCOUNTER — Emergency Department (HOSPITAL_BASED_OUTPATIENT_CLINIC_OR_DEPARTMENT_OTHER)
Admit: 2017-05-29 | Discharge: 2017-05-29 | Disposition: A | Discharge status: Home / Self Care | Payer: Medicare HMO | Attending: Emergency Medicine | Admitting: Emergency Medicine

## 2017-05-29 ENCOUNTER — Emergency Department (HOSPITAL_COMMUNITY): Payer: Medicare HMO

## 2017-05-29 ENCOUNTER — Encounter (HOSPITAL_COMMUNITY): Payer: Self-pay | Admitting: *Deleted

## 2017-05-29 DIAGNOSIS — I129 Hypertensive chronic kidney disease with stage 1 through stage 4 chronic kidney disease, or unspecified chronic kidney disease: Secondary | ICD-10-CM | POA: Insufficient documentation

## 2017-05-29 DIAGNOSIS — Z79899 Other long term (current) drug therapy: Secondary | ICD-10-CM | POA: Diagnosis not present

## 2017-05-29 DIAGNOSIS — N189 Chronic kidney disease, unspecified: Secondary | ICD-10-CM | POA: Diagnosis not present

## 2017-05-29 DIAGNOSIS — M79609 Pain in unspecified limb: Secondary | ICD-10-CM

## 2017-05-29 DIAGNOSIS — Z170 Estrogen receptor positive status [ER+]: Secondary | ICD-10-CM | POA: Insufficient documentation

## 2017-05-29 DIAGNOSIS — R079 Chest pain, unspecified: Secondary | ICD-10-CM | POA: Diagnosis not present

## 2017-05-29 DIAGNOSIS — E039 Hypothyroidism, unspecified: Secondary | ICD-10-CM | POA: Insufficient documentation

## 2017-05-29 DIAGNOSIS — M5432 Sciatica, left side: Secondary | ICD-10-CM | POA: Insufficient documentation

## 2017-05-29 DIAGNOSIS — Z7901 Long term (current) use of anticoagulants: Secondary | ICD-10-CM | POA: Insufficient documentation

## 2017-05-29 DIAGNOSIS — C50412 Malignant neoplasm of upper-outer quadrant of left female breast: Secondary | ICD-10-CM | POA: Diagnosis not present

## 2017-05-29 DIAGNOSIS — M79605 Pain in left leg: Secondary | ICD-10-CM | POA: Diagnosis present

## 2017-05-29 LAB — I-STAT TROPONIN, ED: TROPONIN I, POC: 0.02 ng/mL (ref 0.00–0.08)

## 2017-05-29 LAB — BASIC METABOLIC PANEL
Anion gap: 11 (ref 5–15)
BUN: 35 mg/dL — AB (ref 6–20)
CO2: 22 mmol/L (ref 22–32)
Calcium: 9.4 mg/dL (ref 8.9–10.3)
Chloride: 103 mmol/L (ref 101–111)
Creatinine, Ser: 1.63 mg/dL — ABNORMAL HIGH (ref 0.44–1.00)
GFR calc Af Amer: 36 mL/min — ABNORMAL LOW (ref >60)
GFR, EST NON AFRICAN AMERICAN: 31 mL/min — AB (ref >60)
Glucose, Bld: 103 mg/dL — ABNORMAL HIGH (ref 65–99)
POTASSIUM: 4 mmol/L (ref 3.5–5.1)
SODIUM: 136 mmol/L (ref 135–145)

## 2017-05-29 LAB — CBC
HEMATOCRIT: 35.5 % — AB (ref 36.0–46.0)
Hemoglobin: 11.4 g/dL — ABNORMAL LOW (ref 12.0–15.0)
MCH: 28.3 pg (ref 26.0–34.0)
MCHC: 32.1 g/dL (ref 30.0–36.0)
MCV: 88.1 fL (ref 78.0–100.0)
PLATELETS: 221 10*3/uL (ref 150–400)
RBC: 4.03 MIL/uL (ref 3.87–5.11)
RDW: 13.7 % (ref 11.5–15.5)
WBC: 6.4 10*3/uL (ref 4.0–10.5)

## 2017-05-29 MED ORDER — OXYCODONE-ACETAMINOPHEN 5-325 MG PO TABS
1.0000 | ORAL_TABLET | Freq: Once | ORAL | Status: AC
  Administered 2017-05-29: 1 via ORAL
  Filled 2017-05-29: qty 1

## 2017-05-29 MED ORDER — MORPHINE SULFATE (PF) 4 MG/ML IV SOLN
4.0000 mg | Freq: Once | INTRAVENOUS | Status: DC
  Filled 2017-05-29: qty 1

## 2017-05-29 MED ORDER — ONDANSETRON 4 MG PO TBDP
4.0000 mg | ORAL_TABLET | Freq: Once | ORAL | Status: AC
  Administered 2017-05-29: 4 mg via ORAL
  Filled 2017-05-29: qty 1

## 2017-05-29 MED ORDER — PREDNISONE 10 MG (21) PO TBPK: ORAL_TABLET | Freq: Every day | ORAL | 0 refills | Status: DC

## 2017-05-29 MED ORDER — ONDANSETRON HCL 4 MG/2ML IJ SOLN
4.0000 mg | Freq: Once | INTRAMUSCULAR | Status: DC
  Filled 2017-05-29: qty 2

## 2017-05-29 MED ORDER — TRAMADOL HCL 50 MG PO TABS: 50.0000 mg | ORAL_TABLET | Freq: Four times a day (QID) | ORAL | 0 refills | Status: DC | PRN

## 2017-05-29 NOTE — ED Notes (Signed)
Pt to US.

## 2017-05-29 NOTE — Progress Notes (Signed)
VASCULAR LAB PRELIMINARY  PRELIMINARY  PRELIMINARY  PRELIMINARY  Left lower extremity venous duplex completed.    Preliminary report:  There is no DVT or SVT noted in the left lower extremity.    Called report to [place], [place]  [facility], [facility], RVT 05/29/2017, 2:18 PM

## 2017-05-29 NOTE — ED Notes (Signed)
Got patient undress on the monitor did ekg shown to er Dr Venora Maples

## 2017-05-29 NOTE — ED Provider Notes (Signed)
[place] DEPT Provider Note   CSN: [id number] Arrival date & time: 05/29/17  1123     History   Chief Complaint Chief Complaint  Patient presents with  . Leg Pain    HPI Katelyn Jackson is a 71 y.o. female who presents emergency department with chief complaint of left leg pain and chest pain. She is past medical history of chronic A. fib, chronic kidney disease, shortness of breath. She has previous episodes of left leg pain radiating from the buttocks to the outside of the left leg which have been more mild and self resolving. The patient states that 3 days ago she began having pain in the left buttock radiating down the left leg. She is unsure if she has any swelling on that side. She states about 6 months ago she had the same pain and talked to her PCP about the pain. He stated that she should use symptomatic treatment. She states that self resolved in about 2 weeks. 3 days ago she began having pain again in the same area. However, at this time. The pain is severe. It is worse with movement, sitting for long periods of time. She states that 2 days ago. She noticed having pain in the chest, which comes and goes. She states that it lasts several minutes at a time. She has associated shortness of breath and it appears to be worse with ambulation. She denies a history of previous MI, coronary artery disease, smoking, or family history of early myocardial infarction. She does have hypertension and high cholesterol. She denies a history of hemoptysis, DVT. She did have a breast surgery in June. She denies any smoking history.  HPI  Past Medical History:  Diagnosis Date  . Arthritis   . Cancer [facility])    breast cancer  . Chronic atrial fibrillation ([place])   . Chronic kidney disease    sees Dr Florene Glen  . Cramps, muscle, general   . Dyspnea   . Dyspnea on exertion   . Dysrhythmia   . GERD (gastroesophageal reflux disease)   . Headache   . Hyperlipidemia   . Hypertension   .  Hypothyroidism   . Lymphedema   . Moderate to severe pulmonary hypertension ([place])   . Obesity     Patient Active Problem List   Diagnosis Date Noted  . Anemia of chronic disease 02/16/2017  . Malignant neoplasm of upper-outer quadrant of left breast in female, estrogen receptor positive ([place]) 02/10/2017  . Essential hypertension 04/05/2016  . Hyperlipidemia 04/05/2016  . Chronic atrial fibrillation ([place]) 04/05/2016  . Pulmonary hypertension ([place]) 04/05/2016  . Lymphedema 04/05/2016    Past Surgical History:  Procedure Laterality Date  . APPENDECTOMY    . bilateral eye surgery    . BREAST LUMPECTOMY WITH NEEDLE LOCALIZATION AND AXILLARY SENTINEL LYMPH NODE BX Left 04/04/2017   Procedure: BREAST LUMPECTOMY WITH NEEDLE LOCALIZATION x3 AND AXILLARY SENTINEL LYMPH NODE BX;  Surgeon: [name], MD;  Location: [facility];  Service: General;  Laterality: Left;  . COLONOSCOPY    . RE-EXCISION OF BREAST CANCER,SUPERIOR MARGINS Left 04/26/2017   Procedure: RE-EXCISION OF LEFT BREAST CANCER;  Surgeon: [name], MD;  Location: [facility];  Service: General;  Laterality: Left;    OB History    No data available       Home Medications    Prior to Admission medications   Medication Sig Start Date End Date Taking? Authorizing Provider  febuxostat (ULORIC) 40 MG tablet Take 40 mg by mouth  daily.    [provider]  furosemide (LASIX) 40 MG tablet Take 40 mg by mouth 2 (two) times daily.  02/06/16   [provider]  KLOR-CON M20 20 MEQ tablet Take 20 mEq by mouth daily.  12/07/15   [provider]  levothyroxine (SYNTHROID, LEVOTHROID) 50 MCG tablet Take 50 mcg by mouth daily before breakfast.  02/06/16   [provider]  lipase/protease/amylase (CREON) 12000 units CPEP capsule Take 12,000 Units by mouth daily as needed (stomach problems).     [provider]  lovastatin (MEVACOR) 20 MG tablet Take 20 mg by mouth daily.  02/06/16   [provider]    omeprazole (PRILOSEC) 40 MG capsule Take 40 mg by mouth daily as needed (acid reflux).  02/06/16   [provider]  oxyCODONE (OXY IR/ROXICODONE) 5 MG immediate release tablet Take 1-2 tablets (5-10 mg total) by mouth every 6 (six) hours as needed for moderate pain, severe pain or breakthrough pain. Patient not taking: Reported on 05/17/2017 04/26/17   [name], MD  polyethylene glycol powder (GLYCOLAX/MIRALAX) powder Take 17 g by mouth at bedtime.  02/06/16   [provider]  predniSONE (STERAPRED UNI-PAK 21 TAB) 10 MG (21) TBPK tablet Take by mouth daily. Take 6 tabs by mouth daily  for 2 days, then 5 tabs for 2 days, then 4 tabs for 2 days, then 3 tabs for 2 days, 2 tabs for 2 days, then 1 tab by mouth daily for 2 days 05/29/17   Margarita Mail, PA-C  tiZANidine (ZANAFLEX) 4 MG tablet Take 4 mg by mouth daily as needed for muscle spasms.    [provider]  traMADol (ULTRAM) 50 MG tablet Take 1 tablet (50 mg total) by mouth every 6 (six) hours as needed. 05/29/17   Billey Wojciak, PA-C  valsartan-hydrochlorothiazide (DIOVAN-HCT) 320-25 MG tablet Take 1 tablet by mouth daily.  02/06/16   [provider]  VOLTAREN 1 % GEL Apply 2 g topically 4 (four) times daily as needed (pain).  02/08/16   [provider]  warfarin (COUMADIN) 5 MG tablet Take 5 mg by mouth daily.  02/06/16   [provider]    Family History Family History  Problem Relation Age of Onset  . CAD Mother   . Cancer Neg Hx     Social History Social History  Substance Use Topics  . Smoking status: Never Smoker  . Smokeless tobacco: Never Used  . Alcohol use No     Allergies   Chlorhexidine gluconate and Penicillins   Review of Systems Review of Systems  Ten systems reviewed and are negative for acute change, except as noted in the HPI.   Physical Exam Updated Vital Signs BP 135/88   Pulse 74   Temp 98.6 F (37 C) (Oral)   Resp 19   Ht 5' (1.524 m)   Wt 122 kg  (269 lb)   LMP  (LMP Unknown)   SpO2 95%   BMI 52.54 kg/m   Physical Exam  Constitutional: She is oriented to person, place, and time. She appears well-developed and well-nourished. No distress.  HENT:  Head: Normocephalic and atraumatic.  Eyes: Conjunctivae are normal. No scleral icterus.  Neck: Normal range of motion.  Cardiovascular: Normal rate, regular rhythm and normal heart sounds.  Exam reveals no gallop and no friction rub.   No murmur heard. Pulmonary/Chest: Effort normal and breath sounds normal. No respiratory distress.  Abdominal: Soft. Bowel sounds are normal. She exhibits  no distension and no mass. There is no tenderness. There is no guarding.  Musculoskeletal:  Patient appears to be in mild to moderate pain, antalgic gait noted. Lumbosacral spine area reveals no local tenderness or mass. Painful and reduced LS ROM noted. Straight leg raise is positive at 20 degrees on left. DTR's, motor strength and sensation normal, including heel and toe gait.  Peripheral pulses are palpable.   Neurological: She is alert and oriented to person, place, and time.  Skin: Skin is warm and dry. She is not diaphoretic.  Psychiatric: Her behavior is normal.  Nursing note and vitals reviewed.    ED Treatments / Results  Labs (all labs ordered are listed, but only abnormal results are displayed) Labs Reviewed  BASIC METABOLIC PANEL - Abnormal; Notable for the following:       Result Value   Glucose, Bld 103 (*)    BUN 35 (*)    Creatinine, Ser 1.63 (*)    GFR calc non Af Amer 31 (*)    GFR calc Af Amer 36 (*)    All other components within normal limits  CBC - Abnormal; Notable for the following:    Hemoglobin 11.4 (*)    HCT 35.5 (*)    All other components within normal limits  I-STAT TROPONIN, ED    EKG  EKG Interpretation None       Radiology Dg Chest Port 1 View  Result Date: 05/29/2017 CLINICAL DATA:  Acute onset of chest pain associated with nausea. Current  history of left breast cancer, hypertension, to chronic atrial fibrillation and pulmonary hypertension. EXAM: PORTABLE CHEST 1 VIEW COMPARISON:  None. FINDINGS: Cardiac silhouette markedly enlarged. Thoracic aorta mildly tortuous atherosclerotic. Prominent central pulmonary arteries. Lungs clear. Bronchovascular markings normal. Pulmonary vascularity normal. No visible pleural effusions. No pneumothorax. Left axillary node dissection. IMPRESSION: 1. Marked cardiomegaly.  No acute cardiopulmonary disease. 2. Thoracic aortic atherosclerosis. Electronically Signed   By: Evangeline Dakin M.D.   On: 05/29/2017 13:07    Procedures Procedures (including critical care time)  Medications Ordered in ED Medications  oxyCODONE-acetaminophen (PERCOCET/ROXICET) 5-325 MG per tablet 1 tablet (1 tablet Oral Given 05/29/17 1350)  ondansetron (ZOFRAN-ODT) disintegrating tablet 4 mg (4 mg Oral Given 05/29/17 1350)     Initial Impression / Assessment and Plan / ED Course  I have reviewed the triage vital signs and the nursing notes.  Pertinent labs & imaging results that were available during my care of the patient were reviewed by me and considered in my medical decision making (see chart for details).  Clinical Course as of May 29 1710  Mon May 29, 2017  1426 DVT study is negative   [AH]    Clinical Course User Index [AH] Margarita Mail, PA-C     Patient with sciatica of the left leg. Negative DVT study. She is low risk for ACS, negative troponin. Her chest pain is atypical and intermittent. No shortness of breath, hypoxia or concern for pulmonary embolus. Her chief complaint is fever, neck pain. Patient will be treated with a Sterapred Dosepak, pain medications. She's been seen in shared visit with Dr. Venora Maples who agrees with the workup and plan for discharge.  Final Clinical Impressions(s) / ED Diagnoses   Final diagnoses:  Sciatica of left side  Intermittent chest pain    New  Prescriptions Discharge Medication List as of 05/29/2017  4:28 PM    START taking these medications   Details  predniSONE (STERAPRED UNI-PAK 21 TAB) 10 MG (  21) TBPK tablet Take by mouth daily. Take 6 tabs by mouth daily  for 2 days, then 5 tabs for 2 days, then 4 tabs for 2 days, then 3 tabs for 2 days, 2 tabs for 2 days, then 1 tab by mouth daily for 2 days, Starting Mon 05/29/2017, Print    traMADol (ULTRAM) 50 MG tablet Take 1 tablet (50 mg total) by mouth every 6 (six) hours as needed., Starting Mon 05/29/2017, Print         Kenton Kingfisher Lowrey, PA-C 05/29/17 2028    Jola Schmidt, MD 05/31/17 0001

## 2017-05-29 NOTE — ED Notes (Signed)
Lab results reported to Nurse Hassan Rowan.

## 2017-05-29 NOTE — ED Notes (Signed)
Pt assisted to bathroom

## 2017-05-29 NOTE — Discharge Instructions (Signed)
Your caregiver has diagnosed you as having chest pain that is not specific for one problem, but does not require admission.  You are at low risk for an acute heart condition or other serious illness. Chest pain comes from many different causes.  °SEEK IMMEDIATE MEDICAL ATTENTION IF: °You have severe chest pain, especially if the pain is crushing or pressure-like and spreads to the arms, back, neck, or jaw, or if you have sweating, nausea (feeling sick to your stomach), or shortness of breath. THIS IS AN EMERGENCY. Don't wait to see if the pain will go away. Get medical help at once. Call 911 or 0 (operator). DO NOT drive yourself to the hospital.  °Your chest pain gets worse and does not go away with rest.  °You have an attack of chest pain lasting longer than usual, despite rest and treatment with the medications your caregiver has prescribed.  °You wake from sleep with chest pain or shortness of breath.  °You feel dizzy or faint.  °You have chest pain not typical of your usual pain for which you originally saw your caregiver. ° °SEEK IMMEDIATE MEDICAL ATTENTION IF: °New numbness, tingling, weakness, or problem with the use of your arms or legs.  °Severe back pain not relieved with medications.  °Change in bowel or bladder control.  °Increasing pain in any areas of the body (such as chest or abdominal pain).  °Shortness of breath, dizziness or fainting.  °Nausea (feeling sick to your stomach), vomiting, fever, or sweats. ° °

## 2017-05-29 NOTE — ED Notes (Signed)
Attempted IV start x 2 with no success.  IV team order placed.

## 2017-05-29 NOTE — ED Triage Notes (Addendum)
Pt here via [facility] for pain to L calf since Fri night that now radiates up her leg.  Pt also experienced several episodes of chest pain lasting 3 to 5 minutes that were accompanied by nausea. Surgery to L breast in June.

## 2017-05-31 ENCOUNTER — Ambulatory Visit
Admission: RE | Admit: 2017-05-31 | Discharge: 2017-05-31 | Disposition: A | Discharge status: Home / Self Care | Payer: Medicare HMO | Source: Ambulatory Visit | Attending: Radiation Oncology | Admitting: Radiation Oncology

## 2017-05-31 DIAGNOSIS — C50412 Malignant neoplasm of upper-outer quadrant of left female breast: Secondary | ICD-10-CM

## 2017-05-31 DIAGNOSIS — Z170 Estrogen receptor positive status [ER+]: Principal | ICD-10-CM

## 2017-05-31 DIAGNOSIS — Z510 Encounter for antineoplastic radiation therapy: Secondary | ICD-10-CM | POA: Diagnosis not present

## 2017-05-31 NOTE — Progress Notes (Signed)
  Radiation Oncology         (336) [phone] ________________________________  Name: Katelyn Jackson MRN: [id number]  Date: 05/31/2017  DOB: Oct 06, 1946  Optical Surface Tracking Plan:  Since intensity modulated radiotherapy (IMRT) and 3D conformal radiation treatment methods are predicated on accurate and precise positioning for treatment, intrafraction motion monitoring is medically necessary to ensure accurate and safe treatment delivery.  The ability to quantify intrafraction motion without excessive ionizing radiation dose can only be performed with optical surface tracking. Accordingly, surface imaging offers the opportunity to obtain 3D measurements of patient position throughout IMRT and 3D treatments without excessive radiation exposure.  I am ordering optical surface tracking for this patient's upcoming course of radiotherapy. ________________________________  Kyung Rudd, MD 05/31/2017 5:45 PM    Reference:   Ursula Alert, J, et al. Surface imaging-based analysis of intrafraction motion for breast radiotherapy patients.Journal of [place], n. 6, nov. 2014. ISSN [id number].   Available at: <http://www.jacmp.org/index.php/jacmp/article/view/4957>.

## 2017-05-31 NOTE — Progress Notes (Signed)
  Radiation Oncology         (336) [phone] ________________________________  Name: Katelyn Jackson MRN: [id number]  Date: 05/31/2017  DOB: 07/15/1946   DIAGNOSIS:     ICD-10-CM   1. Malignant neoplasm of upper-outer quadrant of left breast in female, estrogen receptor positive ([place]) C50.412    Z17.0     SIMULATION AND TREATMENT PLANNING NOTE  The patient presented for simulation prior to beginning her course of radiation treatment for her diagnosis of left-sided breast cancer. The patient was placed in a supine position on a breast board. A customized vac-lock bag was constructed and this complex treatment device will be used on a daily basis during her treatment. In this fashion, a CT scan was obtained through the chest area and an isocenter was placed near the chest wall within the breast.  The patient will be planned to receive a course of radiation initially to a dose of 42.5 Gy. This will consist of a whole breast radiotherapy technique. To accomplish this, 2 customized blocks have been designed which will correspond to medial and lateral whole breast tangent fields. This treatment will be accomplished at 2.5 Gy per fraction. A forward planning technique will also be evaluated to determine if this approach improves the plan. It is anticipated that the patient will then receive a 7.5 Gy boost to the seroma cavity which has been contoured. This will be accomplished at 2.5 Gy per fraction.   This initial treatment will consist of a 3-D conformal technique. The seroma has been contoured as the primary target structure. Additionally, dose volume histograms of both this target as well as the lungs and heart will also be evaluated. Such an approach is necessary to ensure that the target area is adequately covered while the nearby critical  normal structures are adequately spared.  Plan:  The final anticipated total dose therefore will correspond to 50 Gy.    Special treatment procedure was  performed today due to the extra time and effort required by myself to plan and prepare this patient for deep inspiration breath hold technique.  I have determined cardiac sparing to be of benefit to this patient to prevent long term cardiac damage due to radiation of the heart.  Bellows were placed on the patient's abdomen. To facilitate cardiac sparing, the patient was coached by the radiation therapists on breath hold techniques and breathing practice was performed. Practice waveforms were obtained. The patient was then scanned while maintaining breath hold in the treatment position.  This image was then transferred over to the imaging specialist. The imaging specialist then created a fusion of the free breathing and breath hold scans using the chest wall as the stable structure. I personally reviewed the fusion in axial, coronal and sagittal image planes.  Excellent cardiac sparing was obtained.  I felt the patient is an appropriate candidate for breath hold and the patient will be treated as such.  The image fusion was then reviewed with the patient to reinforce the necessity of reproducible breath hold.      _______________________________   Jodelle Gross, MD, PhD

## 2017-06-02 DIAGNOSIS — Z510 Encounter for antineoplastic radiation therapy: Secondary | ICD-10-CM | POA: Diagnosis not present

## 2017-06-05 ENCOUNTER — Ambulatory Visit
Admission: RE | Admit: 2017-06-05 | Discharge: 2017-06-05 | Disposition: A | Discharge status: Home / Self Care | Payer: Medicare HMO | Source: Ambulatory Visit | Attending: Radiation Oncology | Admitting: Radiation Oncology

## 2017-06-05 DIAGNOSIS — Z510 Encounter for antineoplastic radiation therapy: Secondary | ICD-10-CM | POA: Diagnosis not present

## 2017-06-06 ENCOUNTER — Ambulatory Visit
Admission: RE | Admit: 2017-06-06 | Discharge: 2017-06-06 | Disposition: A | Discharge status: Home / Self Care | Payer: Medicare HMO | Source: Ambulatory Visit | Attending: Radiation Oncology | Admitting: Radiation Oncology

## 2017-06-06 DIAGNOSIS — Z510 Encounter for antineoplastic radiation therapy: Secondary | ICD-10-CM | POA: Diagnosis not present

## 2017-06-07 ENCOUNTER — Inpatient Hospital Stay
Admission: RE | Admit: 2017-06-07 | Discharge: 2017-06-07 | Disposition: A | Discharge status: Home / Self Care | Payer: Self-pay | Source: Ambulatory Visit | Attending: Radiation Oncology | Admitting: Radiation Oncology

## 2017-06-07 ENCOUNTER — Ambulatory Visit
Admission: RE | Admit: 2017-06-07 | Discharge: 2017-06-07 | Disposition: A | Discharge status: Home / Self Care | Payer: Medicare HMO | Source: Ambulatory Visit | Attending: Radiation Oncology | Admitting: Radiation Oncology

## 2017-06-07 ENCOUNTER — Telehealth: Payer: Self-pay | Admitting: Hematology

## 2017-06-07 DIAGNOSIS — Z510 Encounter for antineoplastic radiation therapy: Secondary | ICD-10-CM | POA: Diagnosis not present

## 2017-06-07 DIAGNOSIS — C50412 Malignant neoplasm of upper-outer quadrant of left female breast: Secondary | ICD-10-CM

## 2017-06-07 DIAGNOSIS — Z170 Estrogen receptor positive status [ER+]: Principal | ICD-10-CM

## 2017-06-07 MED ORDER — RADIAPLEXRX EX GEL
Freq: Once | CUTANEOUS | Status: AC
  Administered 2017-06-07: 15:00:00 via TOPICAL

## 2017-06-07 MED ORDER — ALRA NON-METALLIC DEODORANT (RAD-ONC)
1.0000 "application " | Freq: Once | TOPICAL | Status: AC
  Administered 2017-06-07: 1 via TOPICAL

## 2017-06-07 NOTE — Progress Notes (Signed)
Pt education done(Breast) my business card,alra deodorant,radiaplex cream, Radiation thrapy and you book,discussed ways to manage side effects, skin irritation, pain, fatigue, apply radiaplex gel after rad tx and at bedtime, alra after rad tx and prn,use dove soap unscented, luke warm shower or baths, no rubbing, scrubbing or scrtching aarea, pat dry, sees MD weekly and prn, increase protein in diet, and stay hydrated, drink plenty water, exercise,  Use electric shaver if shaving under axilla, no under wire bras if possible, get plenty rest and sleep, teach back given spoke with [name] interpreter to help  the patient understand, patientnooded yes to everything, will see MD Carole Civil after rad txs with interpreter 3:07 PM

## 2017-06-07 NOTE — Telephone Encounter (Signed)
Scheduled appt per 7/27 sch message - reminder letter sent in the mail.

## 2017-06-08 ENCOUNTER — Ambulatory Visit
Admission: RE | Admit: 2017-06-08 | Discharge: 2017-06-08 | Disposition: A | Discharge status: Home / Self Care | Payer: Medicare HMO | Source: Ambulatory Visit | Attending: Radiation Oncology | Admitting: Radiation Oncology

## 2017-06-08 DIAGNOSIS — Z510 Encounter for antineoplastic radiation therapy: Secondary | ICD-10-CM | POA: Diagnosis not present

## 2017-06-09 ENCOUNTER — Ambulatory Visit
Admission: RE | Admit: 2017-06-09 | Discharge: 2017-06-09 | Disposition: A | Discharge status: Home / Self Care | Payer: Medicare HMO | Source: Ambulatory Visit | Attending: Radiation Oncology | Admitting: Radiation Oncology

## 2017-06-09 DIAGNOSIS — Z510 Encounter for antineoplastic radiation therapy: Secondary | ICD-10-CM | POA: Diagnosis not present

## 2017-06-12 ENCOUNTER — Ambulatory Visit
Admission: RE | Admit: 2017-06-12 | Discharge: 2017-06-12 | Disposition: A | Discharge status: Home / Self Care | Payer: Medicare HMO | Source: Ambulatory Visit | Attending: Radiation Oncology | Admitting: Radiation Oncology

## 2017-06-12 DIAGNOSIS — Z510 Encounter for antineoplastic radiation therapy: Secondary | ICD-10-CM | POA: Diagnosis not present

## 2017-06-13 ENCOUNTER — Ambulatory Visit
Admission: RE | Admit: 2017-06-13 | Discharge: 2017-06-13 | Disposition: A | Discharge status: Home / Self Care | Payer: Medicare HMO | Source: Ambulatory Visit | Attending: Radiation Oncology | Admitting: Radiation Oncology

## 2017-06-13 DIAGNOSIS — Z510 Encounter for antineoplastic radiation therapy: Secondary | ICD-10-CM | POA: Diagnosis not present

## 2017-06-14 ENCOUNTER — Ambulatory Visit
Admission: RE | Admit: 2017-06-14 | Discharge: 2017-06-14 | Disposition: A | Discharge status: Home / Self Care | Payer: Medicare HMO | Source: Ambulatory Visit | Attending: Radiation Oncology | Admitting: Radiation Oncology

## 2017-06-14 DIAGNOSIS — Z510 Encounter for antineoplastic radiation therapy: Secondary | ICD-10-CM | POA: Diagnosis not present

## 2017-06-15 ENCOUNTER — Ambulatory Visit
Admission: RE | Admit: 2017-06-15 | Discharge: 2017-06-15 | Disposition: A | Discharge status: Home / Self Care | Payer: Medicare HMO | Source: Ambulatory Visit | Attending: Radiation Oncology | Admitting: Radiation Oncology

## 2017-06-15 DIAGNOSIS — Z510 Encounter for antineoplastic radiation therapy: Secondary | ICD-10-CM | POA: Diagnosis not present

## 2017-06-16 ENCOUNTER — Ambulatory Visit: Payer: Medicare HMO

## 2017-06-16 ENCOUNTER — Ambulatory Visit
Admission: RE | Admit: 2017-06-16 | Discharge: 2017-06-16 | Disposition: A | Discharge status: Home / Self Care | Payer: Medicare HMO | Source: Ambulatory Visit | Attending: Radiation Oncology | Admitting: Radiation Oncology

## 2017-06-16 DIAGNOSIS — Z510 Encounter for antineoplastic radiation therapy: Secondary | ICD-10-CM | POA: Diagnosis not present

## 2017-06-18 ENCOUNTER — Encounter (HOSPITAL_COMMUNITY): Payer: Self-pay | Admitting: *Deleted

## 2017-06-18 ENCOUNTER — Inpatient Hospital Stay (HOSPITAL_COMMUNITY)
Admission: EM | Admit: 2017-06-18 | Discharge: 2017-06-23 | DRG: 551 | Disposition: A | Discharge status: Home Health | Payer: Medicare HMO | Attending: Family Medicine | Admitting: Family Medicine

## 2017-06-18 DIAGNOSIS — Z880 Allergy status to penicillin: Secondary | ICD-10-CM

## 2017-06-18 DIAGNOSIS — Z170 Estrogen receptor positive status [ER+]: Secondary | ICD-10-CM

## 2017-06-18 DIAGNOSIS — M545 Low back pain, unspecified: Secondary | ICD-10-CM | POA: Diagnosis present

## 2017-06-18 DIAGNOSIS — M5442 Lumbago with sciatica, left side: Principal | ICD-10-CM | POA: Diagnosis present

## 2017-06-18 DIAGNOSIS — I129 Hypertensive chronic kidney disease with stage 1 through stage 4 chronic kidney disease, or unspecified chronic kidney disease: Secondary | ICD-10-CM | POA: Diagnosis present

## 2017-06-18 DIAGNOSIS — N170 Acute kidney failure with tubular necrosis: Secondary | ICD-10-CM | POA: Diagnosis present

## 2017-06-18 DIAGNOSIS — Z7901 Long term (current) use of anticoagulants: Secondary | ICD-10-CM

## 2017-06-18 DIAGNOSIS — Z853 Personal history of malignant neoplasm of breast: Secondary | ICD-10-CM

## 2017-06-18 DIAGNOSIS — E876 Hypokalemia: Secondary | ICD-10-CM | POA: Diagnosis present

## 2017-06-18 DIAGNOSIS — E860 Dehydration: Secondary | ICD-10-CM | POA: Diagnosis present

## 2017-06-18 DIAGNOSIS — N183 Chronic kidney disease, stage 3 (moderate): Secondary | ICD-10-CM | POA: Diagnosis present

## 2017-06-18 DIAGNOSIS — N179 Acute kidney failure, unspecified: Secondary | ICD-10-CM

## 2017-06-18 DIAGNOSIS — N271 Small kidney, bilateral: Secondary | ICD-10-CM | POA: Diagnosis present

## 2017-06-18 DIAGNOSIS — N1832 Chronic kidney disease, stage 3b: Secondary | ICD-10-CM | POA: Diagnosis present

## 2017-06-18 DIAGNOSIS — K5900 Constipation, unspecified: Secondary | ICD-10-CM | POA: Diagnosis not present

## 2017-06-18 DIAGNOSIS — E669 Obesity, unspecified: Secondary | ICD-10-CM | POA: Diagnosis present

## 2017-06-18 DIAGNOSIS — I890 Lymphedema, not elsewhere classified: Secondary | ICD-10-CM | POA: Diagnosis present

## 2017-06-18 DIAGNOSIS — E785 Hyperlipidemia, unspecified: Secondary | ICD-10-CM | POA: Diagnosis present

## 2017-06-18 DIAGNOSIS — C50412 Malignant neoplasm of upper-outer quadrant of left female breast: Secondary | ICD-10-CM | POA: Diagnosis present

## 2017-06-18 DIAGNOSIS — Z79899 Other long term (current) drug therapy: Secondary | ICD-10-CM

## 2017-06-18 DIAGNOSIS — I272 Pulmonary hypertension, unspecified: Secondary | ICD-10-CM | POA: Diagnosis present

## 2017-06-18 DIAGNOSIS — I482 Chronic atrial fibrillation, unspecified: Secondary | ICD-10-CM | POA: Diagnosis present

## 2017-06-18 DIAGNOSIS — M5432 Sciatica, left side: Secondary | ICD-10-CM

## 2017-06-18 DIAGNOSIS — K219 Gastro-esophageal reflux disease without esophagitis: Secondary | ICD-10-CM | POA: Diagnosis present

## 2017-06-18 DIAGNOSIS — Z888 Allergy status to other drugs, medicaments and biological substances status: Secondary | ICD-10-CM

## 2017-06-18 DIAGNOSIS — M5430 Sciatica, unspecified side: Secondary | ICD-10-CM

## 2017-06-18 DIAGNOSIS — E039 Hypothyroidism, unspecified: Secondary | ICD-10-CM | POA: Diagnosis present

## 2017-06-18 DIAGNOSIS — N390 Urinary tract infection, site not specified: Secondary | ICD-10-CM | POA: Diagnosis present

## 2017-06-18 DIAGNOSIS — E871 Hypo-osmolality and hyponatremia: Secondary | ICD-10-CM | POA: Diagnosis not present

## 2017-06-18 DIAGNOSIS — N184 Chronic kidney disease, stage 4 (severe): Secondary | ICD-10-CM | POA: Diagnosis present

## 2017-06-18 DIAGNOSIS — I10 Essential (primary) hypertension: Secondary | ICD-10-CM | POA: Diagnosis present

## 2017-06-18 DIAGNOSIS — Z6841 Body Mass Index (BMI) 40.0 and over, adult: Secondary | ICD-10-CM

## 2017-06-18 DIAGNOSIS — D649 Anemia, unspecified: Secondary | ICD-10-CM | POA: Diagnosis present

## 2017-06-18 MED ORDER — ONDANSETRON 4 MG PO TBDP
4.0000 mg | ORAL_TABLET | Freq: Once | ORAL | Status: AC
  Administered 2017-06-19: 4 mg via ORAL
  Filled 2017-06-18: qty 1

## 2017-06-18 MED ORDER — HYDROCODONE-ACETAMINOPHEN 5-325 MG PO TABS
1.0000 | ORAL_TABLET | Freq: Once | ORAL | Status: AC
  Administered 2017-06-19: 1 via ORAL
  Filled 2017-06-18: qty 1

## 2017-06-18 NOTE — ED Provider Notes (Signed)
[place] DEPT Provider Note   CSN: [id number] Arrival date & time: 06/18/17  1938     History   Chief Complaint Chief Complaint  Patient presents with  . Sciatica    HPI Katelyn Jackson is a 71 y.o. female.  Level V caveat for language barrier. Family at bedside translating. Patient presents with left sided "sciatic nerve pain" that has been ongoing for the past 3 days radiating to her left leg. History of the same and was seen in the ED on July 23 and felt better after getting a course of steroids. States she saw her PCP yesterday and was given a steroid injection in her back. Today she's had severe pain and states that she has vomited more than 20 times today. No dysuria hematuria. No diarrhea. No chest pain or shortness of breath. No bowel or bladder incontinence. No focal weakness, numbness or tingling. Patient does have a history of breast cancer is currently receiving radiation. She has atrial fibrillation on Coumadin. No previous back surgeries   The history is provided by the patient and a relative. The history is limited by a language barrier.    Past Medical History:  Diagnosis Date  . Arthritis   . Cancer [facility])    breast cancer  . Chronic atrial fibrillation ([place])   . Chronic kidney disease    sees Dr Florene Glen  . Cramps, muscle, general   . Dyspnea   . Dyspnea on exertion   . Dysrhythmia   . GERD (gastroesophageal reflux disease)   . Headache   . Hyperlipidemia   . Hypertension   . Hypothyroidism   . Lymphedema   . Moderate to severe pulmonary hypertension ([place])   . Obesity     Patient Active Problem List   Diagnosis Date Noted  . Anemia of chronic disease 02/16/2017  . Malignant neoplasm of upper-outer quadrant of left breast in female, estrogen receptor positive ([place]) 02/10/2017  . Essential hypertension 04/05/2016  . Hyperlipidemia 04/05/2016  . Chronic atrial fibrillation ([place]) 04/05/2016  . Pulmonary hypertension ([place]) 04/05/2016  .  Lymphedema 04/05/2016    Past Surgical History:  Procedure Laterality Date  . APPENDECTOMY    . bilateral eye surgery    . BREAST LUMPECTOMY WITH NEEDLE LOCALIZATION AND AXILLARY SENTINEL LYMPH NODE BX Left 04/04/2017   Procedure: BREAST LUMPECTOMY WITH NEEDLE LOCALIZATION x3 AND AXILLARY SENTINEL LYMPH NODE BX;  Surgeon: [name], MD;  Location: [place];  Service: General;  Laterality: Left;  . COLONOSCOPY    . RE-EXCISION OF BREAST CANCER,SUPERIOR MARGINS Left 04/26/2017   Procedure: RE-EXCISION OF LEFT BREAST CANCER;  Surgeon: [name], MD;  Location: [place];  Service: General;  Laterality: Left;    OB History    No data available       Home Medications    Prior to Admission medications   Medication Sig Start Date End Date Taking? Authorizing Provider  febuxostat (ULORIC) 40 MG tablet Take 40 mg by mouth daily.    [provider]  furosemide (LASIX) 40 MG tablet Take 40 mg by mouth 2 (two) times daily.  02/06/16   [provider]  KLOR-CON M20 20 MEQ tablet Take 20 mEq by mouth daily.  12/07/15   [provider]  levothyroxine (SYNTHROID, LEVOTHROID) 50 MCG tablet Take 50 mcg by mouth daily before breakfast.  02/06/16   [provider]  lipase/protease/amylase (CREON) 12000 units CPEP capsule Take 12,000 Units by mouth daily as needed (stomach problems).  [provider]  lovastatin (MEVACOR) 20 MG tablet Take 20 mg by mouth daily.  02/06/16   [provider]  omeprazole (PRILOSEC) 40 MG capsule Take 40 mg by mouth daily as needed (acid reflux).  02/06/16   [provider]  oxyCODONE (OXY IR/ROXICODONE) 5 MG immediate release tablet Take 1-2 tablets (5-10 mg total) by mouth every 6 (six) hours as needed for moderate pain, severe pain or breakthrough pain. Patient not taking: Reported on 05/17/2017 04/26/17   [name], MD  polyethylene glycol powder (GLYCOLAX/MIRALAX) powder Take 17 g by mouth at bedtime.  02/06/16    [provider]  predniSONE (STERAPRED UNI-PAK 21 TAB) 10 MG (21) TBPK tablet Take by mouth daily. Take 6 tabs by mouth daily  for 2 days, then 5 tabs for 2 days, then 4 tabs for 2 days, then 3 tabs for 2 days, 2 tabs for 2 days, then 1 tab by mouth daily for 2 days 05/29/17   Margarita Mail, PA-C  tiZANidine (ZANAFLEX) 4 MG tablet Take 4 mg by mouth daily as needed for muscle spasms.    [provider]  traMADol (ULTRAM) 50 MG tablet Take 1 tablet (50 mg total) by mouth every 6 (six) hours as needed. 05/29/17   Harris, Abigail, PA-C  valsartan-hydrochlorothiazide (DIOVAN-HCT) 320-25 MG tablet Take 1 tablet by mouth daily.  02/06/16   [provider]  VOLTAREN 1 % GEL Apply 2 g topically 4 (four) times daily as needed (pain).  02/08/16   [provider]  warfarin (COUMADIN) 5 MG tablet Take 5 mg by mouth daily.  02/06/16   [provider]    Family History Family History  Problem Relation Age of [facility]  . CAD Mother   . Cancer Neg Hx     Social History Social History  Substance Use Topics  . Smoking status: Never Smoker  . Smokeless tobacco: Never Used  . Alcohol use No     Allergies   Chlorhexidine gluconate and Penicillins   Review of Systems Review of Systems  Constitutional: Negative for activity change, appetite change and fever.  HENT: Negative for congestion and rhinorrhea.   Eyes: Negative for photophobia.  Respiratory: Negative for cough, chest tightness and shortness of breath.   Cardiovascular: Negative for chest pain.  Gastrointestinal: Negative for abdominal pain, nausea and vomiting.  Genitourinary: Positive for flank pain. Negative for decreased urine volume, dysuria and hematuria.  Musculoskeletal: Positive for back pain. Negative for neck pain.  Skin: Negative for rash.  Neurological: Negative for dizziness, weakness and headaches.    all other systems are negative except as noted in the HPI and PMH.    Physical  Exam Updated Vital Signs BP (!) 160/95 (BP Location: Right Arm)   Pulse 99   Temp 98.2 F (36.8 C) (Oral)   Resp 20   LMP  (LMP Unknown)   SpO2 99%   Physical Exam  Constitutional: She is oriented to person, place, and time. She appears well-developed and well-nourished. No distress.  HENT:  Head: Normocephalic and atraumatic.  Mouth/Throat: Oropharynx is clear and moist. No oropharyngeal exudate.  Eyes: Pupils are equal, round, and reactive to light. Conjunctivae and EOM are normal.  Neck: Normal range of motion. Neck supple.  No meningismus.  Cardiovascular: Normal rate, regular rhythm, normal heart sounds and intact distal pulses.   No murmur heard. Pulmonary/Chest: Effort normal and breath sounds normal. No respiratory distress.  obese  Abdominal: Soft. There is no tenderness. There  is no rebound and no guarding.  Musculoskeletal: Normal range of motion. She exhibits no edema or tenderness.  L SI joint pain 5/5 strength in bilateral lower extremities. Ankle plantar and dorsiflexion intact. Great toe extension intact bilaterally. +2 DP and PT pulses. Unable to elicit patellar reflexes bilaterally. Antalgic gait  Neurological: She is alert and oriented to person, place, and time. No cranial nerve deficit. She exhibits normal muscle tone. Coordination normal.   5/5 strength throughout. CN 2-12 intact.Equal grip strength.   Skin: Skin is warm.  Psychiatric: She has a normal mood and affect. Her behavior is normal.  Nursing note and vitals reviewed.    ED Treatments / Results  Labs (all labs ordered are listed, but only abnormal results are displayed) Labs Reviewed  CBC WITH DIFFERENTIAL/PLATELET - Abnormal; Notable for the following:       Result Value   Hemoglobin 11.6 (*)    HCT 35.3 (*)    All other components within normal limits  COMPREHENSIVE METABOLIC PANEL - Abnormal; Notable for the following:    Sodium 134 (*)    Potassium 2.9 (*)    Chloride 96 (*)     Glucose, Bld 121 (*)    BUN 42 (*)    Creatinine, Ser 2.01 (*)    Albumin 3.4 (*)    GFR calc non Af Amer 24 (*)    GFR calc Af Amer 28 (*)    All other components within normal limits  TROPONIN I - Abnormal; Notable for the following:    Troponin I 0.03 (*)    All other components within normal limits  URINALYSIS, ROUTINE W REFLEX MICROSCOPIC - Abnormal; Notable for the following:    APPearance HAZY (*)    Hgb urine dipstick SMALL (*)    Leukocytes, UA LARGE (*)    Bacteria, UA MANY (*)    Squamous Epithelial / LPF 0-5 (*)    All other components within normal limits  PROTIME-INR - Abnormal; Notable for the following:    Prothrombin Time 15.8 (*)    All other components within normal limits  TROPONIN I - Abnormal; Notable for the following:    Troponin I 0.04 (*)    All other components within normal limits  URINE CULTURE  LIPASE, BLOOD  BASIC METABOLIC PANEL    EKG  EKG Interpretation  Date/Time:  Sunday June 18 2017 23:58:22 EDT Ventricular Rate:  98 PR Interval:    QRS Duration: 130 QT Interval:  348 QTC Calculation: 445 R Axis:   -55 Text Interpretation:  Atrial fibrillation Ventricular premature complex Left bundle branch block Baseline wander in lead(s) V1 No significant change was found Confirmed by Ezequiel Essex [phone]) on 06/19/2017 12:11:08 AM Also confirmed by Ezequiel Essex [id number]), editor Laurena Spies [phone])  on 06/19/2017 7:08:14 AM       Radiology Mr Lumbar Spine Wo Contrast  Result Date: 06/19/2017 CLINICAL DATA:  71 y/o F; sciatic nerve pain radiating to the left lower extremity. EXAM: MRI LUMBAR SPINE WITHOUT CONTRAST TECHNIQUE: Multiplanar, multisequence MR imaging of the lumbar spine was performed. No intravenous contrast was administered. COMPARISON:  None. FINDINGS: Moderate motion degradation. Segmentation:  Standard. Alignment:  Straightening of lumbar lordosis.  No listhesis. Vertebrae: Mild endplate degenerative changes at the L3-4  and L4-5 levels. No evidence of discitis or acute fracture. Conus medullaris: Extends to the L2 level and appears normal. Paraspinal and other soft tissues: 9 mm right kidney interpolar T2 hyperintense well-circumscribed structure compatible with a cyst.  Disc levels: L1-2: No significant disc displacement, foraminal narrowing, or canal stenosis. L2-3: Minimal disc bulge and mild facet hypertrophy. No significant foraminal or canal stenosis. Trace facet effusions. L3-4: Moderate disc bulge, facet, and ligamentum flavum hypertrophy. Mild bilateral foraminal stenosis and moderate canal stenosis. L4-5: Small disc bulge with facet and ligamentum flavum hypertrophy. Mild bilateral foraminal and canal stenosis. L5-S1: Small disc bulge with endplate marginal osteophytes eccentric to the left foraminal and extraforaminal zone as well as left-greater-than-right facet hypertrophy. Mild right and moderate to severe left foraminal stenosis. No significant canal stenosis. IMPRESSION: 1. No acute fracture or malalignment. 2. Multifactorial moderate L3-4 and mild L4-5 canal stenosis. 3. Mild foraminal stenosis from L3-4 through L5-S1 with moderate to severe left L5-S1 foraminal stenosis. Electronically Signed   By: Kristine Garbe M.D.   On: 06/19/2017 03:56    Procedures Procedures (including critical care time)  Medications Ordered in ED Medications  HYDROcodone-acetaminophen (NORCO/VICODIN) 5-325 MG per tablet 1 tablet (not administered)  ondansetron (ZOFRAN-ODT) disintegrating tablet 4 mg (not administered)     Initial Impression / Assessment and Plan / ED Course  I have reviewed the triage vital signs and the nursing notes.  Pertinent labs & imaging results that were available during my care of the patient were reviewed by me and considered in my medical decision making (see chart for details).     Patient with recurrent left-sided sciatica symptoms over the past 3 days. No fever. Has been vomiting  multiple times today after receiving what sounds like epidural injection yesterday. Has a history of breast cancer. On Coumadin.  With recent spinal injection and coumadin use, will obtain MRI to evaluate for possible epidural hematoma. No focal weakness or numbness on exam.  UA with infection, IV rocephin, culture sent. Minimal troponin elevation, likely due to worsening CKD, IVF. No chest pain.  MRI without hematoma or cord compression, L5-S1 foraminal stenosis.  Still with pain and cannot ambulate.  Plan observation admission for hydration, antibiotics, pain control. D/w Dr. Alcario Drought.   Final Clinical Impressions(s) / ED Diagnoses   Final diagnoses:  Sciatica of left side  Urinary tract infection without hematuria, site unspecified  Acute renal failure, unspecified acute renal failure type [facility])    New Prescriptions New Prescriptions   No medications on file     Ezequiel Essex, MD 06/19/17 [phone]

## 2017-06-18 NOTE — ED Triage Notes (Addendum)
Pt c/o sciatic nerve pain onset on Thursday with radiation into L leg; hx of same, denies injury; denies flank or abdominal pain.

## 2017-06-19 ENCOUNTER — Telehealth: Payer: Self-pay

## 2017-06-19 ENCOUNTER — Encounter (HOSPITAL_COMMUNITY): Payer: Self-pay

## 2017-06-19 ENCOUNTER — Emergency Department (HOSPITAL_COMMUNITY): Payer: Medicare HMO

## 2017-06-19 ENCOUNTER — Ambulatory Visit: Payer: Medicare HMO

## 2017-06-19 DIAGNOSIS — N179 Acute kidney failure, unspecified: Secondary | ICD-10-CM | POA: Diagnosis present

## 2017-06-19 DIAGNOSIS — I482 Chronic atrial fibrillation: Secondary | ICD-10-CM

## 2017-06-19 DIAGNOSIS — M545 Low back pain, unspecified: Secondary | ICD-10-CM | POA: Diagnosis present

## 2017-06-19 DIAGNOSIS — N184 Chronic kidney disease, stage 4 (severe): Secondary | ICD-10-CM | POA: Diagnosis present

## 2017-06-19 DIAGNOSIS — N390 Urinary tract infection, site not specified: Secondary | ICD-10-CM

## 2017-06-19 DIAGNOSIS — M5432 Sciatica, left side: Secondary | ICD-10-CM | POA: Diagnosis not present

## 2017-06-19 DIAGNOSIS — E876 Hypokalemia: Secondary | ICD-10-CM | POA: Diagnosis present

## 2017-06-19 DIAGNOSIS — I10 Essential (primary) hypertension: Secondary | ICD-10-CM | POA: Diagnosis not present

## 2017-06-19 DIAGNOSIS — N1832 Chronic kidney disease, stage 3b: Secondary | ICD-10-CM | POA: Diagnosis present

## 2017-06-19 HISTORY — DX: Urinary tract infection, site not specified: N39.0

## 2017-06-19 LAB — COMPREHENSIVE METABOLIC PANEL
ALK PHOS: 64 U/L (ref 38–126)
ALT: 25 U/L (ref 14–54)
ANION GAP: 13 (ref 5–15)
AST: 24 U/L (ref 15–41)
Albumin: 3.4 g/dL — ABNORMAL LOW (ref 3.5–5.0)
BILIRUBIN TOTAL: 1.1 mg/dL (ref 0.3–1.2)
BUN: 42 mg/dL — ABNORMAL HIGH (ref 6–20)
CALCIUM: 9.5 mg/dL (ref 8.9–10.3)
CO2: 25 mmol/L (ref 22–32)
CREATININE: 2.01 mg/dL — AB (ref 0.44–1.00)
Chloride: 96 mmol/L — ABNORMAL LOW (ref 101–111)
GFR calc non Af Amer: 24 mL/min — ABNORMAL LOW (ref >60)
GFR, EST AFRICAN AMERICAN: 28 mL/min — AB (ref >60)
GLUCOSE: 121 mg/dL — AB (ref 65–99)
Potassium: 2.9 mmol/L — ABNORMAL LOW (ref 3.5–5.1)
SODIUM: 134 mmol/L — AB (ref 135–145)
TOTAL PROTEIN: 7.1 g/dL (ref 6.5–8.1)

## 2017-06-19 LAB — PROTIME-INR
INR: 1.25
INR: 1.28
PROTHROMBIN TIME: 16.1 s — AB (ref 11.4–15.2)
Prothrombin Time: 15.8 seconds — ABNORMAL HIGH (ref 11.4–15.2)

## 2017-06-19 LAB — CBC WITH DIFFERENTIAL/PLATELET
Basophils Absolute: 0 10*3/uL (ref 0.0–0.1)
Basophils Relative: 0 %
Eosinophils Absolute: 0 10*3/uL (ref 0.0–0.7)
Eosinophils Relative: 0 %
HEMATOCRIT: 35.3 % — AB (ref 36.0–46.0)
HEMOGLOBIN: 11.6 g/dL — AB (ref 12.0–15.0)
LYMPHS ABS: 1.5 10*3/uL (ref 0.7–4.0)
LYMPHS PCT: 21 %
MCH: 28.7 pg (ref 26.0–34.0)
MCHC: 32.9 g/dL (ref 30.0–36.0)
MCV: 87.4 fL (ref 78.0–100.0)
MONOS PCT: 7 %
Monocytes Absolute: 0.5 10*3/uL (ref 0.1–1.0)
NEUTROS ABS: 5.1 10*3/uL (ref 1.7–7.7)
NEUTROS PCT: 72 %
Platelets: 218 10*3/uL (ref 150–400)
RBC: 4.04 MIL/uL (ref 3.87–5.11)
RDW: 14.5 % (ref 11.5–15.5)
WBC: 7.1 10*3/uL (ref 4.0–10.5)

## 2017-06-19 LAB — URINALYSIS, ROUTINE W REFLEX MICROSCOPIC
BILIRUBIN URINE: NEGATIVE
Glucose, UA: NEGATIVE mg/dL
KETONES UR: NEGATIVE mg/dL
Nitrite: NEGATIVE
Protein, ur: NEGATIVE mg/dL
Specific Gravity, Urine: 1.012 (ref 1.005–1.030)
pH: 5 (ref 5.0–8.0)

## 2017-06-19 LAB — TROPONIN I
TROPONIN I: 0.03 ng/mL — AB (ref <0.03)
TROPONIN I: 0.04 ng/mL — AB (ref <0.03)

## 2017-06-19 LAB — BASIC METABOLIC PANEL
ANION GAP: 12 (ref 5–15)
BUN: 41 mg/dL — ABNORMAL HIGH (ref 6–20)
CHLORIDE: 97 mmol/L — AB (ref 101–111)
CO2: 26 mmol/L (ref 22–32)
CREATININE: 2.18 mg/dL — AB (ref 0.44–1.00)
Calcium: 9.3 mg/dL (ref 8.9–10.3)
GFR calc non Af Amer: 22 mL/min — ABNORMAL LOW (ref >60)
GFR, EST AFRICAN AMERICAN: 25 mL/min — AB (ref >60)
Glucose, Bld: 125 mg/dL — ABNORMAL HIGH (ref 65–99)
POTASSIUM: 3.6 mmol/L (ref 3.5–5.1)
SODIUM: 135 mmol/L (ref 135–145)

## 2017-06-19 LAB — LIPASE, BLOOD: Lipase: 29 U/L (ref 11–51)

## 2017-06-19 MED ORDER — ACETAMINOPHEN 325 MG PO TABS: 650.0000 mg | ORAL_TABLET | Freq: Four times a day (QID) | ORAL | Status: DC | PRN

## 2017-06-19 MED ORDER — DEXTROSE 5 % IV SOLN
1.0000 g | Freq: Once | INTRAVENOUS | Status: AC
  Administered 2017-06-19: 1 g via INTRAVENOUS
  Filled 2017-06-19 (×2): qty 10

## 2017-06-19 MED ORDER — ONDANSETRON HCL 4 MG/2ML IJ SOLN
4.0000 mg | Freq: Four times a day (QID) | INTRAMUSCULAR | Status: DC | PRN
Start: 2017-06-19 — End: 2017-06-24
  Administered 2017-06-19 – 2017-06-22 (×2): 4 mg via INTRAVENOUS
  Filled 2017-06-19 (×2): qty 2

## 2017-06-19 MED ORDER — POLYETHYLENE GLYCOL 3350 17 G PO PACK
17.0000 g | PACK | Freq: Every day | ORAL | Status: DC
  Administered 2017-06-19 – 2017-06-23 (×5): 17 g via ORAL
  Filled 2017-06-19 (×6): qty 1

## 2017-06-19 MED ORDER — PRAVASTATIN SODIUM 10 MG PO TABS
20.0000 mg | ORAL_TABLET | Freq: Every day | ORAL | Status: DC
  Administered 2017-06-19 – 2017-06-23 (×5): 20 mg via ORAL
  Filled 2017-06-19 (×5): qty 2

## 2017-06-19 MED ORDER — TEMAZEPAM 7.5 MG PO CAPS: 7.5000 mg | ORAL_CAPSULE | Freq: Every evening | ORAL | Status: DC | PRN

## 2017-06-19 MED ORDER — POTASSIUM CHLORIDE CRYS ER 20 MEQ PO TBCR
40.0000 meq | EXTENDED_RELEASE_TABLET | Freq: Once | ORAL | Status: AC
Start: 2017-06-19 — End: 2017-06-19
  Administered 2017-06-19: 40 meq via ORAL
  Filled 2017-06-19: qty 2

## 2017-06-19 MED ORDER — HYDRALAZINE HCL 20 MG/ML IJ SOLN: 5.0000 mg | INTRAMUSCULAR | Status: DC | PRN

## 2017-06-19 MED ORDER — PREDNISONE 20 MG PO TABS
40.0000 mg | ORAL_TABLET | Freq: Every day | ORAL | Status: DC
  Administered 2017-06-19: 40 mg via ORAL
  Filled 2017-06-19: qty 2

## 2017-06-19 MED ORDER — WARFARIN SODIUM 4 MG PO TABS: 8.0000 mg | ORAL_TABLET | Freq: Once | ORAL | Status: DC

## 2017-06-19 MED ORDER — LEVOTHYROXINE SODIUM 50 MCG PO TABS
50.0000 ug | ORAL_TABLET | Freq: Every day | ORAL | Status: DC
  Administered 2017-06-19 – 2017-06-23 (×5): 50 ug via ORAL
  Filled 2017-06-19 (×5): qty 1

## 2017-06-19 MED ORDER — SODIUM CHLORIDE 0.9 % IV BOLUS (SEPSIS)
500.0000 mL | Freq: Once | INTRAVENOUS | Status: AC
  Administered 2017-06-19: 500 mL via INTRAVENOUS

## 2017-06-19 MED ORDER — SODIUM CHLORIDE 0.9 % IV SOLN
INTRAVENOUS | Status: DC
  Administered 2017-06-19: 07:00:00 via INTRAVENOUS

## 2017-06-19 MED ORDER — DEXAMETHASONE 4 MG PO TABS
10.0000 mg | ORAL_TABLET | Freq: Once | ORAL | Status: AC
Start: 2017-06-19 — End: 2017-06-19
  Administered 2017-06-19: 10 mg via ORAL
  Filled 2017-06-19: qty 3

## 2017-06-19 MED ORDER — CEFTRIAXONE SODIUM 1 G IJ SOLR
1.0000 g | INTRAMUSCULAR | Status: DC
  Administered 2017-06-20 – 2017-06-23 (×4): 1 g via INTRAVENOUS
  Filled 2017-06-19 (×5): qty 10

## 2017-06-19 MED ORDER — METHOCARBAMOL 500 MG PO TABS
500.0000 mg | ORAL_TABLET | Freq: Four times a day (QID) | ORAL | Status: DC | PRN
  Administered 2017-06-20 (×2): 500 mg via ORAL
  Filled 2017-06-19: qty 1

## 2017-06-19 MED ORDER — WARFARIN SODIUM 4 MG PO TABS
8.0000 mg | ORAL_TABLET | Freq: Once | ORAL | Status: AC
  Administered 2017-06-19: 8 mg via ORAL
  Filled 2017-06-19: qty 2

## 2017-06-19 MED ORDER — HYDROCODONE-ACETAMINOPHEN 5-325 MG PO TABS
1.0000 | ORAL_TABLET | Freq: Once | ORAL | Status: AC
  Administered 2017-06-19: 1 via ORAL
  Filled 2017-06-19: qty 1

## 2017-06-19 MED ORDER — HYDROCODONE-ACETAMINOPHEN 5-325 MG PO TABS
1.0000 | ORAL_TABLET | ORAL | Status: DC | PRN
  Administered 2017-06-19 – 2017-06-20 (×3): 1 via ORAL
  Filled 2017-06-19 (×3): qty 1

## 2017-06-19 MED ORDER — POTASSIUM CHLORIDE CRYS ER 20 MEQ PO TBCR
20.0000 meq | EXTENDED_RELEASE_TABLET | Freq: Every day | ORAL | Status: DC
  Administered 2017-06-19 – 2017-06-20 (×2): 20 meq via ORAL
  Filled 2017-06-19: qty 1

## 2017-06-19 MED ORDER — POTASSIUM CHLORIDE CRYS ER 20 MEQ PO TBCR
40.0000 meq | EXTENDED_RELEASE_TABLET | Freq: Once | ORAL | Status: AC
  Administered 2017-06-19: 40 meq via ORAL
  Filled 2017-06-19: qty 2

## 2017-06-19 MED ORDER — ASPIRIN 81 MG PO CHEW
324.0000 mg | CHEWABLE_TABLET | Freq: Once | ORAL | Status: AC
  Administered 2017-06-19: 324 mg via ORAL
  Filled 2017-06-19: qty 4

## 2017-06-19 MED ORDER — ACETAMINOPHEN 650 MG RE SUPP: 650.0000 mg | Freq: Four times a day (QID) | RECTAL | Status: DC | PRN

## 2017-06-19 MED ORDER — FUROSEMIDE 40 MG PO TABS
40.0000 mg | ORAL_TABLET | Freq: Two times a day (BID) | ORAL | Status: DC
  Administered 2017-06-19: 40 mg via ORAL
  Filled 2017-06-19: qty 1

## 2017-06-19 MED ORDER — HYDROCODONE-ACETAMINOPHEN 5-325 MG PO TABS: 1.0000 | ORAL_TABLET | Freq: Four times a day (QID) | ORAL | Status: DC | PRN

## 2017-06-19 MED ORDER — PANCRELIPASE (LIP-PROT-AMYL) 12000-38000 UNITS PO CPEP: 12000.0000 [IU] | ORAL_CAPSULE | Freq: Every day | ORAL | Status: DC | PRN

## 2017-06-19 MED ORDER — FEBUXOSTAT 40 MG PO TABS
40.0000 mg | ORAL_TABLET | Freq: Every day | ORAL | Status: DC
  Administered 2017-06-19 – 2017-06-23 (×5): 40 mg via ORAL
  Filled 2017-06-19 (×5): qty 1

## 2017-06-19 MED ORDER — PANTOPRAZOLE SODIUM 40 MG PO TBEC
80.0000 mg | DELAYED_RELEASE_TABLET | Freq: Every day | ORAL | Status: DC
  Administered 2017-06-19 – 2017-06-23 (×5): 80 mg via ORAL
  Filled 2017-06-19 (×5): qty 2

## 2017-06-19 MED ORDER — WARFARIN - PHARMACIST DOSING INPATIENT: Freq: Every day | Status: DC

## 2017-06-19 NOTE — Telephone Encounter (Signed)
I learned from the radiation therapist today that Ms. Dehaas was admitted to the hospital last evening. She is currently admitted at [facility] for work up of "sciatica pain". I called and spoke to her nurse, Carly. She reported that she is doing well, but is receiving potassium replacement at this time. Carly called and spoke with the admitting physician and the patient will most likely be discharged tomorrow. I have spoken with Shona Simpson PA and the radiation therapist and we will cancel her radiation appointment today and she will present after discharge tomorrow to receive radiation. I have called Carly back and she will make the patient aware that she needs to come here tomorrow after discharge for radiation treatment.

## 2017-06-19 NOTE — H&P (Addendum)
History and Physical    Katelyn Jackson YBO:[id number] DOB: 11-16-1945 DOA: 06/18/2017  PCP: Sandi Mariscal, MD  Patient coming from: Home  I have personally briefly reviewed patient's old medical records in [place]  Chief Complaint: Sciatica  HPI: Katelyn Jackson is a 71 y.o. female with medical history significant of Chronic A.Fib on coumadin, HTN, BRCA s/p excision on radiation.  Patient presents to the ED with L sided "sciatic nerve pain" that has been ongoing for past 3 days.  Pain radiates to L leg.  History of same and was seen in ED on July 23rd and felt better after course of steroids.  Saw PCP yesterday, given "steroid injection in back".  Today has had severe pain and vomited more than 20 times.  No dysuria, no diarrhea.   ED Course: Got 26m PO decadron, norco.  UA shows UTI so got rocephin.  500 cc IVF bolus for mild AKI with creat 2.0 (baseline 1.6)  Still unable to walk.   Review of Systems: As per HPI otherwise 10 point review of systems negative.   Past Medical History:  Diagnosis Date  . Arthritis   . Cancer ([facility]    breast cancer  . [place]   . Chronic kidney disease    sees Dr PFlorene Glen . Cramps, muscle, general   . Dyspnea   . Dyspnea on exertion   . Dysrhythmia   . GERD (gastroesophageal reflux disease)   . Headache   . Hyperlipidemia   . Hypertension   . Hypothyroidism   . Lymphedema   . Moderate to severe pulmonary hypertension ([place]   . Obesity     Past Surgical History:  Procedure Laterality Date  . APPENDECTOMY    . bilateral eye surgery    . BREAST LUMPECTOMY WITH NEEDLE LOCALIZATION AND AXILLARY SENTINEL LYMPH NODE BX Left 04/04/2017   Procedure: BREAST LUMPECTOMY WITH NEEDLE LOCALIZATION x3 AND AXILLARY SENTINEL LYMPH NODE BX;  Surgeon: BStark Klein MD;  [place]  Service: General;  Laterality: Left;  . COLONOSCOPY    . RE-EXCISION OF BREAST CANCER,SUPERIOR MARGINS Left 04/26/2017   Procedure:  RE-EXCISION OF LEFT BREAST CANCER;  Surgeon: BStark Klein MD;  [place]  Service: General;  Laterality: Left;     reports that she has never smoked. She has never used smokeless tobacco. She reports that she does not drink alcohol or use drugs.  Allergies  Allergen Reactions  . Chlorhexidine Gluconate Hives  . Penicillins Itching    Has patient had a PCN reaction causing immediate rash, facial/tongue/throat swelling, SOB or lightheadedness with hypotension: no Has patient had a PCN reaction causing severe rash involving mucus membranes or skin necrosis: No Has patient had a PCN reaction that required hospitalization: No Has patient had a PCN reaction occurring within the last 10 years: No If all of the above answers are "NO", then may proceed with Cephalosporin use.    Family History  Problem Relation Age of [place]  . CAD Mother   . Cancer Neg Hx      Prior to Admission medications   Medication Sig Start Date End Date Taking? Authorizing Provider  febuxostat (ULORIC) 40 MG tablet Take 40 mg by mouth daily.   Yes [provider]  furosemide (LASIX) 40 MG tablet Take 40 mg by mouth 2 (two) times daily.  02/06/16  Yes [provider]  KLOR-CON M20 20 MEQ tablet Take 20 mEq by mouth daily.  12/07/15  Yes  [provider]  levothyroxine (SYNTHROID, LEVOTHROID) 50 MCG tablet Take 50 mcg by mouth daily before breakfast.  02/06/16  Yes [provider]  lipase/protease/amylase (CREON) 12000 units CPEP capsule Take 12,000 Units by mouth daily as needed (stomach problems).    Yes [provider]  lovastatin (MEVACOR) 20 MG tablet Take 20 mg by mouth daily.  02/06/16  Yes [provider]  omeprazole (PRILOSEC) 40 MG capsule Take 40 mg by mouth daily as needed (acid reflux).  02/06/16  Yes [provider]  polyethylene glycol powder (GLYCOLAX/MIRALAX) powder Take 17 g by mouth at bedtime.  02/06/16  Yes [provider]    valsartan-hydrochlorothiazide (DIOVAN-HCT) 320-25 MG tablet Take 1 tablet by mouth daily.  02/06/16  Yes [provider]  warfarin (COUMADIN) 5 MG tablet Take 5 mg by mouth daily.  02/06/16  Yes [provider]    Physical Exam: Vitals:   06/19/17 0045 06/19/17 0200 06/19/17 0215 06/19/17 0230  BP: 134/80 130/74 119/71 115/71  Pulse: 98 98 88 87  Resp: 13 20 15 19   Temp:      TempSrc:      SpO2: 98% 98% 99% 99%    Constitutional: NAD, calm, comfortable Eyes: PERRL, lids and conjunctivae normal ENMT: Mucous membranes are moist. Posterior pharynx clear of any exudate or lesions.Normal dentition.  Neck: normal, supple, no masses, no thyromegaly Respiratory: clear to auscultation bilaterally, no wheezing, no crackles. Normal respiratory effort. No accessory muscle use.  Cardiovascular: Regular rate and rhythm, no murmurs / rubs / gallops. No extremity edema. 2+ pedal pulses. No carotid bruits.  Abdomen: no tenderness, no masses palpated. No hepatosplenomegaly. Bowel sounds positive.  Musculoskeletal: no clubbing / cyanosis. No joint deformity upper and lower extremities. Good ROM, no contractures. Normal muscle tone.  Skin: no rashes, lesions, ulcers. No induration Neurologic: CN 2-12 grossly intact. Sensation intact, DTR normal. Strength 5/5 in all 4.  Psychiatric: Normal judgment and insight. Alert and oriented x 3. Normal mood.    Labs on Admission: I have personally reviewed following labs and imaging studies  CBC:  Recent Labs Lab 06/19/17 0102  WBC 7.1  NEUTROABS 5.1  HGB 11.6*  HCT 35.3*  MCV 87.4  PLT 841   Basic Metabolic Panel:  Recent Labs Lab 06/19/17 0102  NA 134*  K 2.9*  CL 96*  CO2 25  GLUCOSE 121*  BUN 42*  CREATININE 2.01*  CALCIUM 9.5   GFR: CrCl cannot be calculated (Unknown ideal weight.). Liver Function Tests:  Recent Labs Lab 06/19/17 0102  AST 24  ALT 25  ALKPHOS 64  BILITOT 1.1  PROT 7.1  ALBUMIN 3.4*     Recent Labs Lab 06/19/17 0102  LIPASE 29   No results for input(s): AMMONIA in the last 168 hours. Coagulation Profile:  Recent Labs Lab 06/19/17 0102  INR 1.25   Cardiac Enzymes:  Recent Labs Lab 06/19/17 0102 06/19/17 0257  TROPONINI 0.03* 0.04*   BNP (last 3 results) No results for input(s): PROBNP in the last 8760 hours. HbA1C: No results for input(s): HGBA1C in the last 72 hours. CBG: No results for input(s): GLUCAP in the last 168 hours. Lipid Profile: No results for input(s): CHOL, HDL, LDLCALC, TRIG, CHOLHDL, LDLDIRECT in the last 72 hours. Thyroid Function Tests: No results for input(s): TSH, T4TOTAL, FREET4, T3FREE, THYROIDAB in the last 72 hours. Anemia Panel: No results for input(s): VITAMINB12, FOLATE, FERRITIN, TIBC, IRON, RETICCTPCT in the last 72 hours. Urine analysis:  Component Value Date/Time   COLORURINE YELLOW 06/19/2017 0123   APPEARANCEUR HAZY (A) 06/19/2017 0123   LABSPEC 1.012 06/19/2017 0123   PHURINE 5.0 06/19/2017 0123   GLUCOSEU NEGATIVE 06/19/2017 0123   HGBUR SMALL (A) 06/19/2017 0123   BILIRUBINUR NEGATIVE 06/19/2017 0123   KETONESUR NEGATIVE 06/19/2017 0123   PROTEINUR NEGATIVE 06/19/2017 0123   NITRITE NEGATIVE 06/19/2017 0123   LEUKOCYTESUR LARGE (A) 06/19/2017 0123    Radiological Exams on Admission: Mr Lumbar Spine Wo Contrast  Result Date: 06/19/2017 CLINICAL DATA:  71 y/o F; sciatic nerve pain radiating to the left lower extremity. EXAM: MRI LUMBAR SPINE WITHOUT CONTRAST TECHNIQUE: Multiplanar, multisequence MR imaging of the lumbar spine was performed. No intravenous contrast was administered. COMPARISON:  None. FINDINGS: Moderate motion degradation. Segmentation:  Standard. Alignment:  Straightening of lumbar lordosis.  No listhesis. Vertebrae: Mild endplate degenerative changes at the L3-4 and L4-5 levels. No evidence of discitis or acute fracture. Conus medullaris: Extends to the L2 level and appears normal.  Paraspinal and other soft tissues: 9 mm right kidney interpolar T2 hyperintense well-circumscribed structure compatible with a cyst. Disc levels: L1-2: No significant disc displacement, foraminal narrowing, or canal stenosis. L2-3: Minimal disc bulge and mild facet hypertrophy. No significant foraminal or canal stenosis. Trace facet effusions. L3-4: Moderate disc bulge, facet, and ligamentum flavum hypertrophy. Mild bilateral foraminal stenosis and moderate canal stenosis. L4-5: Small disc bulge with facet and ligamentum flavum hypertrophy. Mild bilateral foraminal and canal stenosis. L5-S1: Small disc bulge with endplate marginal osteophytes eccentric to the left foraminal and extraforaminal zone as well as left-greater-than-right facet hypertrophy. Mild right and moderate to severe left foraminal stenosis. No significant canal stenosis. IMPRESSION: 1. No acute fracture or malalignment. 2. Multifactorial moderate L3-4 and mild L4-5 canal stenosis. 3. Mild foraminal stenosis from L3-4 through L5-S1 with moderate to severe left L5-S1 foraminal stenosis. Electronically Signed   By: Kristine Garbe M.D.   On: 06/19/2017 03:56    EKG: Independently reviewed.  Assessment/Plan Principal Problem:   Intractable low back pain Active Problems:   Essential hypertension   Chronic atrial fibrillation (HCC)   AKI (acute kidney injury) (HCC)   Hypokalemia   Acute lower UTI    1. Intractable low back pain - sciatica 1. Norco PRN 2. Will hold off on further steroids for now 2. AKI on CKD stage 3 - 1. IVF: 500 cc bolus in ED and 100 cc/hr of NS 2. Hold Diuretics 3. Hold ARB 4. Repeat BMP tomorrow AM 3. Hypokalemia - replace K 4. HTN - 1. Holding diuretics and ARB due to AKI 2. Will write for PRN hydralazine IV instead 5. Chronic A.Fib - 1. Continue coumadin 6. UTI - 1. Rocephin 2. Culture pending  DVT prophylaxis: Coumadin Code Status: Full Family Communication: Family at bedside, family  translates for patient Disposition Plan: Home after admit Consults called: None Admission status: Place in [place], [place] [facility] Pager [phone]  If 7AM-7PM, please contact day team taking care of patient [email or web address] Password [facility]  06/19/2017, 5:47 AM

## 2017-06-19 NOTE — Care Management Obs Status (Signed)
[place] NOTIFICATION   Patient Details  Name: Zhavia Cunanan MRN: [id number] Date of Birth: 04-Nov-1946   Medicare Observation Status Notification Given:  Yes    Marilu Favre, RN 06/19/2017, 2:04 PM

## 2017-06-19 NOTE — Progress Notes (Signed)
ANTICOAGULATION CONSULT NOTE - Initial Consult  Pharmacy Consult for Coumadin Indication: atrial fibrillation  Allergies  Allergen Reactions  . Chlorhexidine Gluconate Hives  . Penicillins Itching    Has patient had a PCN reaction causing immediate rash, facial/tongue/throat swelling, SOB or lightheadedness with hypotension: no Has patient had a PCN reaction causing severe rash involving mucus membranes or skin necrosis: No Has patient had a PCN reaction that required hospitalization: No Has patient had a PCN reaction occurring within the last 10 years: No If all of the above answers are "NO", then may proceed with Cephalosporin use.    Patient Measurements: Height: 5\' 2"  (157.5 cm) Weight: 277 lb (125.6 kg) IBW/kg (Calculated) : 50.1  Vital Signs: Temp: 98.2 F (36.8 C) (08/12 1947) Temp Source: Oral (08/12 1947) BP: 115/71 (08/13 0230) Pulse Rate: 87 (08/13 0230)  Labs:  Recent Labs  06/19/17 0102 06/19/17 0257  HGB 11.6*  --   HCT 35.3*  --   PLT 218  --   LABPROT 15.8*  --   INR 1.25  --   CREATININE 2.01*  --   TROPONINI 0.03* 0.04*    Estimated Creatinine Clearance: 32.5 mL/min (A) (by C-G formula based on SCr of 2.01 mg/dL (H)).   Medical History: Past Medical History:  Diagnosis Date  . Arthritis   . Cancer [facility])    breast cancer  . Chronic atrial fibrillation ([place])   . Chronic kidney disease    sees Dr Florene Glen  . Cramps, muscle, general   . Dyspnea   . Dyspnea on exertion   . Dysrhythmia   . GERD (gastroesophageal reflux disease)   . Headache   . Hyperlipidemia   . Hypertension   . Hypothyroidism   . Lymphedema   . Moderate to severe pulmonary hypertension ([place])   . Obesity     Assessment: 71yo female c/o sciatica and vomiting, to continue Coumadin for Afib; current INR below goal w/ last dose taken 8/10.  Goal of Therapy:  INR 2-3   Plan:  Will give boosted Coumadin dose of 8mg  po x1 today and monitor INR for dose  adjustments.  Wynona Neat, PharmD, BCPS  06/19/2017,5:57 AM

## 2017-06-19 NOTE — Progress Notes (Addendum)
Patient admitted after midnight, please see H&P.   -recent IM steroid injection by PCP-- records requested from PCP -IR consult for ? Epidural injection and then outpatient NS follow up-- unfort. given coumadin this AM before I could see -replace K -continue treatment for UTI -robaxin, pain control, PT, heat  Plan for d/c in AM  Eulogio Bear DO

## 2017-06-19 NOTE — ED Notes (Signed)
Patient transported to MRI 

## 2017-06-19 NOTE — ED Notes (Signed)
Attempted PIV x2 without success due to small and rolling veins.  Will consult an ultrasound IV trained RN and/or IV team.

## 2017-06-19 NOTE — ED Notes (Signed)
Attempted report 

## 2017-06-20 ENCOUNTER — Inpatient Hospital Stay (HOSPITAL_COMMUNITY): Payer: Medicare HMO

## 2017-06-20 ENCOUNTER — Ambulatory Visit: Payer: Medicare HMO

## 2017-06-20 DIAGNOSIS — E669 Obesity, unspecified: Secondary | ICD-10-CM | POA: Diagnosis present

## 2017-06-20 DIAGNOSIS — N390 Urinary tract infection, site not specified: Secondary | ICD-10-CM | POA: Diagnosis present

## 2017-06-20 DIAGNOSIS — K5900 Constipation, unspecified: Secondary | ICD-10-CM | POA: Diagnosis not present

## 2017-06-20 DIAGNOSIS — E871 Hypo-osmolality and hyponatremia: Secondary | ICD-10-CM | POA: Diagnosis not present

## 2017-06-20 DIAGNOSIS — N179 Acute kidney failure, unspecified: Secondary | ICD-10-CM | POA: Diagnosis not present

## 2017-06-20 DIAGNOSIS — I10 Essential (primary) hypertension: Secondary | ICD-10-CM | POA: Diagnosis not present

## 2017-06-20 DIAGNOSIS — I890 Lymphedema, not elsewhere classified: Secondary | ICD-10-CM | POA: Diagnosis present

## 2017-06-20 DIAGNOSIS — E039 Hypothyroidism, unspecified: Secondary | ICD-10-CM | POA: Diagnosis present

## 2017-06-20 DIAGNOSIS — N271 Small kidney, bilateral: Secondary | ICD-10-CM | POA: Diagnosis present

## 2017-06-20 DIAGNOSIS — E785 Hyperlipidemia, unspecified: Secondary | ICD-10-CM | POA: Diagnosis present

## 2017-06-20 DIAGNOSIS — M545 Low back pain: Secondary | ICD-10-CM | POA: Diagnosis not present

## 2017-06-20 DIAGNOSIS — E860 Dehydration: Secondary | ICD-10-CM | POA: Diagnosis present

## 2017-06-20 DIAGNOSIS — I482 Chronic atrial fibrillation: Secondary | ICD-10-CM | POA: Diagnosis present

## 2017-06-20 DIAGNOSIS — Z79899 Other long term (current) drug therapy: Secondary | ICD-10-CM | POA: Diagnosis not present

## 2017-06-20 DIAGNOSIS — D649 Anemia, unspecified: Secondary | ICD-10-CM | POA: Diagnosis present

## 2017-06-20 DIAGNOSIS — I272 Pulmonary hypertension, unspecified: Secondary | ICD-10-CM | POA: Diagnosis present

## 2017-06-20 DIAGNOSIS — Z7901 Long term (current) use of anticoagulants: Secondary | ICD-10-CM | POA: Diagnosis not present

## 2017-06-20 DIAGNOSIS — I129 Hypertensive chronic kidney disease with stage 1 through stage 4 chronic kidney disease, or unspecified chronic kidney disease: Secondary | ICD-10-CM | POA: Diagnosis present

## 2017-06-20 DIAGNOSIS — Z888 Allergy status to other drugs, medicaments and biological substances status: Secondary | ICD-10-CM | POA: Diagnosis not present

## 2017-06-20 DIAGNOSIS — M5442 Lumbago with sciatica, left side: Secondary | ICD-10-CM | POA: Diagnosis present

## 2017-06-20 DIAGNOSIS — Z6841 Body Mass Index (BMI) 40.0 and over, adult: Secondary | ICD-10-CM | POA: Diagnosis not present

## 2017-06-20 DIAGNOSIS — N183 Chronic kidney disease, stage 3 (moderate): Secondary | ICD-10-CM | POA: Diagnosis present

## 2017-06-20 DIAGNOSIS — Z170 Estrogen receptor positive status [ER+]: Secondary | ICD-10-CM | POA: Diagnosis not present

## 2017-06-20 DIAGNOSIS — K219 Gastro-esophageal reflux disease without esophagitis: Secondary | ICD-10-CM | POA: Diagnosis present

## 2017-06-20 DIAGNOSIS — N170 Acute kidney failure with tubular necrosis: Secondary | ICD-10-CM | POA: Diagnosis present

## 2017-06-20 DIAGNOSIS — C50412 Malignant neoplasm of upper-outer quadrant of left female breast: Secondary | ICD-10-CM | POA: Diagnosis present

## 2017-06-20 DIAGNOSIS — E876 Hypokalemia: Secondary | ICD-10-CM | POA: Diagnosis present

## 2017-06-20 LAB — BASIC METABOLIC PANEL
Anion gap: 10 (ref 5–15)
Anion gap: 13 (ref 5–15)
BUN: 54 mg/dL — AB (ref 6–20)
BUN: 62 mg/dL — AB (ref 6–20)
CO2: 24 mmol/L (ref 22–32)
CO2: 19 mmol/L — ABNORMAL LOW (ref 22–32)
CREATININE: 2.91 mg/dL — AB (ref 0.44–1.00)
CREATININE: 3.25 mg/dL — AB (ref 0.44–1.00)
Calcium: 9.7 mg/dL (ref 8.9–10.3)
Calcium: 9.3 mg/dL (ref 8.9–10.3)
Chloride: 101 mmol/L (ref 101–111)
Chloride: 99 mmol/L — ABNORMAL LOW (ref 101–111)
GFR, EST AFRICAN AMERICAN: 18 mL/min — AB (ref >60)
GFR, EST AFRICAN AMERICAN: 15 mL/min — AB (ref >60)
GFR, EST NON AFRICAN AMERICAN: 15 mL/min — AB (ref >60)
GFR, EST NON AFRICAN AMERICAN: 13 mL/min — AB (ref >60)
Glucose, Bld: 141 mg/dL — ABNORMAL HIGH (ref 65–99)
Glucose, Bld: 164 mg/dL — ABNORMAL HIGH (ref 65–99)
POTASSIUM: 4.3 mmol/L (ref 3.5–5.1)
Potassium: 4.6 mmol/L (ref 3.5–5.1)
SODIUM: 135 mmol/L (ref 135–145)
SODIUM: 131 mmol/L — AB (ref 135–145)

## 2017-06-20 LAB — CBC
HCT: 37.1 % (ref 36.0–46.0)
Hemoglobin: 11.8 g/dL — ABNORMAL LOW (ref 12.0–15.0)
MCH: 28 pg (ref 26.0–34.0)
MCHC: 31.8 g/dL (ref 30.0–36.0)
MCV: 88.1 fL (ref 78.0–100.0)
PLATELETS: 224 10*3/uL (ref 150–400)
RBC: 4.21 MIL/uL (ref 3.87–5.11)
RDW: 14.6 % (ref 11.5–15.5)
WBC: 8.2 10*3/uL (ref 4.0–10.5)

## 2017-06-20 LAB — PROTIME-INR
INR: 1.39
PROTHROMBIN TIME: 17.2 s — AB (ref 11.4–15.2)

## 2017-06-20 LAB — SODIUM, URINE, RANDOM

## 2017-06-20 LAB — CREATININE, URINE, RANDOM: Creatinine, Urine: 119.81 mg/dL

## 2017-06-20 MED ORDER — CEPHALEXIN 250 MG PO CAPS: 250.0000 mg | ORAL_CAPSULE | Freq: Two times a day (BID) | ORAL | 0 refills | Status: DC

## 2017-06-20 MED ORDER — HYDROCODONE-ACETAMINOPHEN 5-325 MG PO TABS
1.0000 | ORAL_TABLET | ORAL | Status: DC
  Administered 2017-06-20 – 2017-06-23 (×17): 1 via ORAL
  Filled 2017-06-20 (×17): qty 1

## 2017-06-20 MED ORDER — HYDROCODONE-ACETAMINOPHEN 5-325 MG PO TABS
1.0000 | ORAL_TABLET | Freq: Every evening | ORAL | Status: DC | PRN
  Administered 2017-06-21 – 2017-06-22 (×2): 1 via ORAL
  Filled 2017-06-20 (×2): qty 1

## 2017-06-20 MED ORDER — HYDROCODONE-ACETAMINOPHEN 5-325 MG PO TABS
1.0000 | ORAL_TABLET | ORAL | Status: DC
  Administered 2017-06-20: 1 via ORAL
  Filled 2017-06-20: qty 1

## 2017-06-20 MED ORDER — METHOCARBAMOL 500 MG PO TABS: 500.0000 mg | ORAL_TABLET | Freq: Four times a day (QID) | ORAL | 0 refills | Status: DC

## 2017-06-20 MED ORDER — SODIUM CHLORIDE 0.9 % IV SOLN
INTRAVENOUS | Status: AC
  Administered 2017-06-21 – 2017-06-22 (×2): via INTRAVENOUS

## 2017-06-20 MED ORDER — HYDROCODONE-ACETAMINOPHEN 5-325 MG PO TABS: 1.0000 | ORAL_TABLET | ORAL | 0 refills | Status: AC | PRN

## 2017-06-20 MED ORDER — METHOCARBAMOL 500 MG PO TABS
500.0000 mg | ORAL_TABLET | Freq: Four times a day (QID) | ORAL | Status: DC
  Administered 2017-06-20 – 2017-06-23 (×15): 500 mg via ORAL
  Filled 2017-06-20 (×15): qty 1

## 2017-06-20 NOTE — Progress Notes (Signed)
PROGRESS NOTE    Katelyn Jackson  SNK:[id number] DOB: 04/23/1946 DOA: 06/18/2017 PCP: Sandi Mariscal, MD   Outpatient Specialists:     Brief Narrative:  Katelyn Jackson is a 71 y.o. female with medical history significant of Chronic A.Fib on coumadin, HTN, BRCA s/p excision on radiation.  Patient presents to the ED with L sided "sciatic nerve pain" that has been ongoing for past 3 days.  Pain radiates to L leg.  History of same and was seen in ED on July 23rd and felt better after course of steroids.  Saw PCP yesterday, given "steroid injection in back".  Today has had severe pain and vomited more than 20 times.  No dysuria, no diarrhea.   Assessment & Plan:   Principal Problem:   Intractable low back pain Active Problems:   Essential hypertension   Chronic atrial fibrillation (HCC)   AKI (acute kidney injury) (HCC)   Hypokalemia   Acute lower UTI   Intractable low back pain - sciatica [facility] scheduled Robaxin scheduled Attempted to arrange steroid injection but unable to get done in hospital- may be able to do outpatient   AKI on CKD stage 3 - - IVF -appears to be pre-renal -renal U/S -BMP in AM -bladder scan shows patient is not retaining urine  Hypokalemia - replace K  HTN -  -hold diuretic  Chronic A.Fib - Stop coumadin in anticipation of precedure  UTI - Rocephin Culture pending- ecoli + another gram +  Breast cancer -unable to do radiation as unable to lay on back to get  DVT prophylaxis:  scd  Code Status: Full Code   Family Communication: son  Disposition Plan:     Consultants:   IR   Subjective: Back pain worse at night  Objective: Vitals:   06/19/17 1310 06/19/17 2133 06/20/17 0522 06/20/17 1401  BP: 112/83  122/72 102/70  Pulse: (!) 101 98 98 98  Resp:  18 15 18   Temp: 98.2 F (36.8 C) 97.9 F (36.6 C) 98.5 F (36.9 C) 97.8 F (36.6 C)  TempSrc: Oral Oral Oral Oral  SpO2: 100% 99% 97% 96%  Weight:      Height:         Intake/Output Summary (Last 24 hours) at 06/20/17 1649 Last data filed at 06/20/17 1345  Gross per 24 hour  Intake              890 ml  Output              200 ml  Net              690 ml   Filed Weights   06/19/17 0500  Weight: 125.6 kg (277 lb)    Examination:  General exam: chronically ill appearing Respiratory system: diminished due to size Cardiovascular system: rrr Gastrointestinal system: +obese Central nervous system: Alert and oriented. No focal neurological deficits. Extremities: larger lower legs-- no pitting edema Psychiatry: Judgement and insight appear normal. Mood & affect appropriate.     Data Reviewed: I have personally reviewed following labs and imaging studies  CBC:  Recent Labs Lab 06/19/17 0102 06/20/17 0540  WBC 7.1 8.2  NEUTROABS 5.1  --   HGB 11.6* 11.8*  HCT 35.3* 37.1  MCV 87.4 88.1  PLT 218 937   Basic Metabolic Panel:  Recent Labs Lab 06/19/17 0102 06/19/17 0938 06/20/17 0540 06/20/17 1355  NA 134* 135 135 131*  K 2.9* 3.6 4.3 4.6  CL 96* 97* 101 99*  CO2  25 26 24  19*  GLUCOSE 121* 125* 141* 164*  BUN 42* 41* 54* 62*  CREATININE 2.01* 2.18* 2.91* 3.25*  CALCIUM 9.5 9.3 9.7 9.3   GFR: Estimated Creatinine Clearance: 20.1 mL/min (A) (by C-G formula based on SCr of 3.25 mg/dL (H)). Liver Function Tests:  Recent Labs Lab 06/19/17 0102  AST 24  ALT 25  ALKPHOS 64  BILITOT 1.1  PROT 7.1  ALBUMIN 3.4*    Recent Labs Lab 06/19/17 0102  LIPASE 29   No results for input(s): AMMONIA in the last 168 hours. Coagulation Profile:  Recent Labs Lab 06/19/17 0102 06/19/17 1225 06/20/17 0540  INR 1.25 1.28 1.39   Cardiac Enzymes:  Recent Labs Lab 06/19/17 0102 06/19/17 0257  TROPONINI 0.03* 0.04*   BNP (last 3 results) No results for input(s): PROBNP in the last 8760 hours. HbA1C: No results for input(s): HGBA1C in the last 72 hours. CBG: No results for input(s): GLUCAP in the last 168 hours. Lipid  Profile: No results for input(s): CHOL, HDL, LDLCALC, TRIG, CHOLHDL, LDLDIRECT in the last 72 hours. Thyroid Function Tests: No results for input(s): TSH, T4TOTAL, FREET4, T3FREE, THYROIDAB in the last 72 hours. Anemia Panel: No results for input(s): VITAMINB12, FOLATE, FERRITIN, TIBC, IRON, RETICCTPCT in the last 72 hours. Urine analysis:    Component Value Date/Time   COLORURINE YELLOW 06/19/2017 0123   APPEARANCEUR HAZY (A) 06/19/2017 0123   LABSPEC 1.012 06/19/2017 0123   PHURINE 5.0 06/19/2017 0123   GLUCOSEU NEGATIVE 06/19/2017 0123   HGBUR SMALL (A) 06/19/2017 0123   BILIRUBINUR NEGATIVE 06/19/2017 0123   KETONESUR NEGATIVE 06/19/2017 0123   PROTEINUR NEGATIVE 06/19/2017 0123   NITRITE NEGATIVE 06/19/2017 0123   LEUKOCYTESUR LARGE (A) 06/19/2017 0123     ) Recent Results (from the past 240 hour(s))  Urine Culture     Status: Abnormal (Preliminary result)   Collection Time: 06/19/17  1:23 AM  Result Value Ref Range Status   Specimen Description URINE, RANDOM  Final   Special Requests CX ADDED AT 0214 ON [id number]  Final   Culture (A)  Final    >=100,000 COLONIES/mL GRAM NEGATIVE RODS >=100,000 COLONIES/mL ESCHERICHIA COLI IDENTIFICATION AND SUSCEPTIBILITIES TO FOLLOW    Report Status PENDING  Incomplete      Anti-infectives    Start     Dose/Rate Route Frequency Ordered Stop   06/20/17 0700  cefTRIAXone (ROCEPHIN) 1 g in dextrose 5 % 50 mL IVPB     1 g 100 mL/hr over 30 Minutes Intravenous Every 24 hours 06/19/17 0523     06/20/17 0000  cephALEXin (KEFLEX) 250 MG capsule     250 mg Oral 2 times daily 06/20/17 1341     06/19/17 0200  cefTRIAXone (ROCEPHIN) 1 g in dextrose 5 % 50 mL IVPB     1 g 100 mL/hr over 30 Minutes Intravenous  Once 06/19/17 0153 06/19/17 0606       Radiology Studies: Mr Lumbar Spine Wo Contrast  Result Date: 06/19/2017 CLINICAL DATA:  71 y/o F; sciatic nerve pain radiating to the left lower extremity. EXAM: MRI LUMBAR SPINE WITHOUT  CONTRAST TECHNIQUE: Multiplanar, multisequence MR imaging of the lumbar spine was performed. No intravenous contrast was administered. COMPARISON:  None. FINDINGS: Moderate motion degradation. Segmentation:  Standard. Alignment:  Straightening of lumbar lordosis.  No listhesis. Vertebrae: Mild endplate degenerative changes at the L3-4 and L4-5 levels. No evidence of discitis or acute fracture. Conus medullaris: Extends to the L2 level and appears normal. Paraspinal  and other soft tissues: 9 mm right kidney interpolar T2 hyperintense well-circumscribed structure compatible with a cyst. Disc levels: L1-2: No significant disc displacement, foraminal narrowing, or canal stenosis. L2-3: Minimal disc bulge and mild facet hypertrophy. No significant foraminal or canal stenosis. Trace facet effusions. L3-4: Moderate disc bulge, facet, and ligamentum flavum hypertrophy. Mild bilateral foraminal stenosis and moderate canal stenosis. L4-5: Small disc bulge with facet and ligamentum flavum hypertrophy. Mild bilateral foraminal and canal stenosis. L5-S1: Small disc bulge with endplate marginal osteophytes eccentric to the left foraminal and extraforaminal zone as well as left-greater-than-right facet hypertrophy. Mild right and moderate to severe left foraminal stenosis. No significant canal stenosis. IMPRESSION: 1. No acute fracture or malalignment. 2. Multifactorial moderate L3-4 and mild L4-5 canal stenosis. 3. Mild foraminal stenosis from L3-4 through L5-S1 with moderate to severe left L5-S1 foraminal stenosis. Electronically Signed   By: Kristine Garbe M.D.   On: 06/19/2017 03:56        Scheduled Meds: . febuxostat  40 mg Oral Daily  . HYDROcodone-acetaminophen  1 tablet Oral Q4H while awake  . levothyroxine  50 mcg Oral QAC breakfast  . methocarbamol  500 mg Oral QID  . pantoprazole  80 mg Oral Daily  . polyethylene glycol  17 g Oral QHS  . pravastatin  20 mg Oral q1800   Continuous Infusions: .  sodium chloride    . cefTRIAXone (ROCEPHIN)  IV Stopped (06/20/17 0646)     LOS: 0 days    Time spent: 35 min    Page, DO [facility] Pager [phone]  If 7PM-7AM, please contact night-coverage [email or web address] Password [facility] 06/20/2017, 4:49 PM

## 2017-06-20 NOTE — Progress Notes (Signed)
Unfortunately, due to scheduling, we will not be able to do an ESI while the patient is here in the hospital.  She can contact the spine center at [phone] to schedule this for later this week.  I have discussed this with Dr. Eliseo Squires.  Mort Smelser E 9:54 AM 06/20/2017

## 2017-06-20 NOTE — Care Management Note (Signed)
Case Management Note  Patient Details  Name: Ermalinda Joubert MRN: [id number] Date of Birth: 1946/02/24  Subjective/Objective:                    Action/Plan:  Confirmed face sheet information with son and offered choice. Expected Discharge Date:                  Expected Discharge Plan:  [place]  In-House Referral:     Discharge planning Services  CM Consult  Post Acute Care Choice:  Durable Medical Equipment, Home Health Choice offered to:  Patient  DME Arranged:  [facility] rolling DME Agency:  [place] Arranged:  PT [facility] Agency:  [place]  Status of Service:  Completed, signed off  If discussed at [place] of Stay Meetings, dates discussed:    Additional Comments:  Marilu Favre, RN 06/20/2017, 3:46 PM

## 2017-06-20 NOTE — Evaluation (Addendum)
Physical Therapy Evaluation Patient Details Name: Jacquiline Zurcher MRN: [id number] DOB: 1946-03-09 Today's Date: 06/20/2017   History of Present Illness  71 yo with PMHx: Chronic A.Fib on coumadin, HTN, HLD, breast CA s/p excision on radiation.  Patient presents to the ED with L sided "sciatic nerve pain" and positive UTI. Pt with steroid injection in back 8/11  Clinical Impression  Pt pleasant, sitting in chair on arrival. Pt reports feeling much better today and having walked in hall with family. Pt reports 20 steps to her apartment which she normally does several times a day but fatigued after 100' and 4 steps today. Pt with decreased endurance, strength, function and balance who will benefit from acute therapy to maximize mobility, function and gait to decrease burden of care.     Follow Up Recommendations Home health PT    Equipment Recommendations  Rolling walker with 5" wheels    Recommendations for Other Services       Precautions / Restrictions Precautions Precautions: Fall      Mobility  Bed Mobility               General bed mobility comments: pt in chair on arrival and end of session per pt request  Transfers Overall transfer level: Needs assistance   Transfers: Sit to/from Stand Sit to Stand: Modified independent (Device/Increase time);Min assist         General transfer comment: mod I to stand from recliner, min assist to stand from bench in hallway without armrests  Ambulation/Gait Ambulation/Gait assistance: Supervision Ambulation Distance (Feet): 100 Feet Assistive device: Rolling walker (2 wheeled) Gait Pattern/deviations: Step-through pattern;Decreased stride length;Trunk flexed   Gait velocity interpretation: Below normal speed for age/gender General Gait Details: cues for posture and position in RW with 2 trials of 100' with seated rest due to fatigue after stairs and cramping in RLE  Stairs Stairs: Yes Stairs assistance: Min  assist Stair Management: Step to pattern;Forwards;One rail Right Number of Stairs: 4 General stair comments: HHA on left with guarding for balance, Pt unable to complete further stairs due to hot stairwell and fatigue  Wheelchair Mobility    Modified Rankin (Stroke Patients Only)       Balance Overall balance assessment: Needs assistance   Sitting balance-Leahy Scale: Good       Standing balance-Leahy Scale: Fair                               Pertinent Vitals/Pain Pain Assessment: 0-10 Pain Score: 3  Pain Location: low back, posterior left thigh Pain Descriptors / Indicators: Sore Pain Intervention(s): Monitored during session    Home Living Family/patient expects to be discharged to:: Private residence Living Arrangements: Children Available Help at Discharge: Family;Available 24 hours/day Type of Home: Apartment Home Access: Stairs to enter Entrance Stairs-Rails: Can reach both Entrance Stairs-Number of Steps: 20 Home Layout: One level Home Equipment: Hospital bed;Other (comment);Tub bench (lift chair)      Prior Function Level of Independence: Needs assistance   Gait / Transfers Assistance Needed: son assists with stair ambulation  ADL's / Homemaking Assistance Needed: sits in tub, daughter assists into tub, family does the cooking and cleaning        Hand Dominance        Extremity/Trunk Assessment   Upper Extremity Assessment Upper Extremity Assessment: Overall WFL for tasks assessed    Lower Extremity Assessment Lower Extremity Assessment: Generalized weakness    Cervical /  Trunk Assessment Cervical / Trunk Assessment: Kyphotic  Communication   Communication: Other (comment) (primary language is [name])  Cognition Arousal/Alertness: Awake/alert Behavior During Therapy: WFL for tasks assessed/performed Overall Cognitive Status: Within Functional Limits for tasks assessed                                         General Comments      Exercises     Assessment/Plan    PT Assessment Patient needs continued PT services  PT Problem List Decreased strength;Decreased mobility;Obesity;Decreased activity tolerance;Decreased knowledge of use of DME;Pain       PT Treatment Interventions Gait training;Therapeutic exercise;Patient/family education;Stair training;Functional mobility training;DME instruction;Therapeutic activities    PT Goals (Current goals can be found in the Care Plan section)  Acute Rehab PT Goals Patient Stated Goal: return home PT Goal Formulation: With patient Time For Goal Achievement: 06/27/17 Potential to Achieve Goals: Good    Frequency Min 3X/week   Barriers to discharge        Co-evaluation               AM-PAC PT "6 Clicks" Daily Activity  Outcome Measure Difficulty turning over in bed (including adjusting bedclothes, sheets and blankets)?: A Little Difficulty moving from lying on back to sitting on the side of the bed? : A Little Difficulty sitting down on and standing up from a chair with arms (e.g., wheelchair, bedside commode, etc,.)?: A Little Help needed moving to and from a bed to chair (including a wheelchair)?: A Little Help needed walking in hospital room?: A Little Help needed climbing 3-5 steps with a railing? : A Little 6 Click Score: 18    End of Session Equipment Utilized During Treatment: Gait belt Activity Tolerance: Patient tolerated treatment well Patient left: in chair;with call bell/phone within reach Nurse Communication: Mobility status PT Visit Diagnosis: Other abnormalities of gait and mobility (R26.89);Pain;Difficulty in walking, not elsewhere classified (R26.2) Pain - Right/Left: Left Pain - part of body: Leg    Time: 5631-4970 PT Time Calculation (min) (ACUTE ONLY): 29 min   Charges:   PT Evaluation $PT Eval Moderate Complexity: 1 Mod PT Treatments $Gait Training: 8-22 mins   PT G Codes:   PT G-Codes **NOT FOR  INPATIENT CLASS** Functional Assessment Tool Used: AM-PAC 6 Clicks Basic Mobility Functional Limitation: Mobility: Walking and moving around Mobility: Walking and Moving Around Current Status (Y6378): At least 20 percent but less than 40 percent impaired, limited or restricted Mobility: Walking and Moving Around Goal Status [phone]): At least 1 percent but less than 20 percent impaired, limited or restricted    Elwyn Reach, [place]   Sandy Salaam Dohn Stclair 06/20/2017, 3:12 PM

## 2017-06-21 ENCOUNTER — Ambulatory Visit: Payer: Medicare HMO

## 2017-06-21 ENCOUNTER — Ambulatory Visit: Payer: Medicare HMO | Admitting: Podiatry

## 2017-06-21 DIAGNOSIS — E876 Hypokalemia: Secondary | ICD-10-CM

## 2017-06-21 LAB — URINE CULTURE

## 2017-06-21 LAB — BASIC METABOLIC PANEL
Anion gap: 13 (ref 5–15)
BUN: 66 mg/dL — ABNORMAL HIGH (ref 6–20)
CALCIUM: 9.5 mg/dL (ref 8.9–10.3)
CHLORIDE: 97 mmol/L — AB (ref 101–111)
CO2: 22 mmol/L (ref 22–32)
Creatinine, Ser: 3.31 mg/dL — ABNORMAL HIGH (ref 0.44–1.00)
GFR calc Af Amer: 15 mL/min — ABNORMAL LOW (ref >60)
GFR calc non Af Amer: 13 mL/min — ABNORMAL LOW (ref >60)
GLUCOSE: 118 mg/dL — AB (ref 65–99)
POTASSIUM: 4.4 mmol/L (ref 3.5–5.1)
Sodium: 132 mmol/L — ABNORMAL LOW (ref 135–145)

## 2017-06-21 LAB — PROTIME-INR
INR: 1.52
Prothrombin Time: 18.5 seconds — ABNORMAL HIGH (ref 11.4–15.2)

## 2017-06-21 MED ORDER — LIDOCAINE 5 % EX PTCH
1.0000 | MEDICATED_PATCH | CUTANEOUS | Status: DC
  Administered 2017-06-21 – 2017-06-23 (×3): 1 via TRANSDERMAL
  Filled 2017-06-21 (×3): qty 1

## 2017-06-21 NOTE — Progress Notes (Signed)
Pt refuses to elevate legs when ask to recline in chair. She states they feel best when in the floor. Instructed on importance of elevating the legs to pt and son.

## 2017-06-21 NOTE — Progress Notes (Signed)
PROGRESS NOTE    Katelyn Jackson  AFB:[id number] DOB: 09-12-1946 DOA: 06/18/2017 PCP: Sandi Mariscal, MD   Outpatient Specialists:     Brief Narrative:  Katelyn Jackson is a 71 y.o. female with medical history significant of Chronic A.Fib on coumadin, HTN, BRCA s/p excision on radiation.  Patient presents to the ED with L sided "sciatic nerve pain" that has been ongoing for past 3 days.  Pain radiates to L leg.  History of same and was seen in ED on July 23rd and felt better after course of steroids.  Saw PCP yesterday, given a IM steroid injection.  Today has had severe pain and vomited more than 20 times.  No dysuria, no diarrhea.   Assessment & Plan:   Principal Problem:   Intractable low back pain Active Problems:   Essential hypertension   Chronic atrial fibrillation (HCC)   AKI (acute kidney injury) (HCC)   Hypokalemia   Acute lower UTI   Intractable low back pain - sciatica Norco scheduled Robaxin scheduled Lidocaine patch Attempted to arrange steroid injection but unable to get done in hospital- may be able to do outpatient   AKI on CKD stage 3 - - IVF- still elevated but appears to have slowed down -appears to be pre-renal -renal U/S shows small kidneys -renal consult -BMP in AM  Hypokalemia - replaced K  HTN -  -hold diuretic  Chronic A.Fib - Stop coumadin in anticipation of procedure  UTI - Rocephin Culture pending- ecoli + klebsiella  Breast cancer -unable to do radiation as unable to lay on back  DVT prophylaxis:  scd  Code Status: Full Code   Family Communication: son  Disposition Plan:     Consultants:   IR  renal   Subjective: Pain is better with scheduled medications  Objective: Vitals:   06/20/17 0522 06/20/17 1401 06/20/17 1900 06/21/17 0512  BP: 122/72 102/70 128/81 131/65  Pulse: 98 98 78 62  Resp: _0 Temp: 98.5 F (36.9 C) 97.8 F (36.6 C) 98.3 F (36.8 C) 97.8 F (36.6 C)  TempSrc: Oral Oral  Oral Oral  SpO2: 97% 96% 99% 100%  Weight:      Height:        Intake/Output Summary (Last 24 hours) at 06/21/17 1353 Last data filed at 06/21/17 2919  Gross per 24 hour  Intake             1360 ml  Output                0 ml  Net             1360 ml   Filed Weights   06/19/17 0500  Weight: 125.6 kg (277 lb)    Examination:  General exam: appears tired Respiratory system: diminished due to size Cardiovascular system: rrr Gastrointestinal system: +obese Central nervous system: Alert and oriented. No focal neurological deficits. Extremities: larger lower legs-- no pitting edema but hard to assess volume Psychiatry: Judgement and insight appear normal. Mood & affect appropriate.     Data Reviewed: I have personally reviewed following labs and imaging studies  CBC:  Recent Labs Lab 06/19/17 0102 06/20/17 0540  WBC 7.1 8.2  NEUTROABS 5.1  --   HGB 11.6* 11.8*  HCT 35.3* 37.1  MCV 87.4 88.1  PLT 218 166   Basic Metabolic Panel:  Recent Labs Lab 06/19/17 0102 06/19/17 0938 06/20/17 0540 06/20/17 1355 06/21/17 0255  NA 134* 135 135 131* 132*  K  2.9* 3.6 4.3 4.6 4.4  CL 96* 97* 101 99* 97*  CO2 _0 19* 22  GLUCOSE 121* 125* 141* 164* 118*  BUN 42* 41* 54* 62* 66*  CREATININE 2.01* 2.18* 2.91* 3.25* 3.31*  CALCIUM 9.5 9.3 9.7 9.3 9.5   GFR: Estimated Creatinine Clearance: 19.8 mL/min (A) (by C-G formula based on SCr of 3.31 mg/dL (H)). Liver Function Tests:  Recent Labs Lab 06/19/17 0102  AST 24  ALT 25  ALKPHOS 64  BILITOT 1.1  PROT 7.1  ALBUMIN 3.4*    Recent Labs Lab 06/19/17 0102  LIPASE 29   No results for input(s): AMMONIA in the last 168 hours. Coagulation Profile:  Recent Labs Lab 06/19/17 0102 06/19/17 1225 06/20/17 0540 06/21/17 0255  INR 1.25 1.28 1.39 1.52   Cardiac Enzymes:  Recent Labs Lab 06/19/17 0102 06/19/17 0257  TROPONINI 0.03* 0.04*   BNP (last 3 results) No results for input(s): PROBNP in the last  8760 hours. HbA1C: No results for input(s): HGBA1C in the last 72 hours. CBG: No results for input(s): GLUCAP in the last 168 hours. Lipid Profile: No results for input(s): CHOL, HDL, LDLCALC, TRIG, CHOLHDL, LDLDIRECT in the last 72 hours. Thyroid Function Tests: No results for input(s): TSH, T4TOTAL, FREET4, T3FREE, THYROIDAB in the last 72 hours. Anemia Panel: No results for input(s): VITAMINB12, FOLATE, FERRITIN, TIBC, IRON, RETICCTPCT in the last 72 hours. Urine analysis:    Component Value Date/Time   COLORURINE YELLOW 06/19/2017 0123   APPEARANCEUR HAZY (A) 06/19/2017 0123   LABSPEC 1.012 06/19/2017 0123   PHURINE 5.0 06/19/2017 0123   GLUCOSEU NEGATIVE 06/19/2017 0123   HGBUR SMALL (A) 06/19/2017 0123   BILIRUBINUR NEGATIVE 06/19/2017 0123   KETONESUR NEGATIVE 06/19/2017 0123   PROTEINUR NEGATIVE 06/19/2017 0123   NITRITE NEGATIVE 06/19/2017 0123   LEUKOCYTESUR LARGE (A) 06/19/2017 0123     ) Recent Results (from the past 240 hour(s))  Urine Culture     Status: Abnormal   Collection Time: 06/19/17  1:23 AM  Result Value Ref Range Status   Specimen Description URINE, RANDOM  Final   Special Requests CX ADDED AT 0214 ON 660600  Final   Culture (A)  Final    >=100,000 COLONIES/mL KLEBSIELLA PNEUMONIAE >=100,000 COLONIES/mL ESCHERICHIA COLI    Report Status 06/21/2017 FINAL  Final   Organism ID, Bacteria KLEBSIELLA PNEUMONIAE (A)  Final   Organism ID, Bacteria ESCHERICHIA COLI (A)  Final      Susceptibility   Escherichia coli - MIC*    AMPICILLIN >=32 RESISTANT Resistant     CEFAZOLIN <=4 SENSITIVE Sensitive     CEFTRIAXONE <=1 SENSITIVE Sensitive     CIPROFLOXACIN <=0.25 SENSITIVE Sensitive     GENTAMICIN <=1 SENSITIVE Sensitive     IMIPENEM <=0.25 SENSITIVE Sensitive     NITROFURANTOIN <=16 SENSITIVE Sensitive     TRIMETH/SULFA <=20 SENSITIVE Sensitive     AMPICILLIN/SULBACTAM 16 INTERMEDIATE Intermediate     PIP/TAZO <=4 SENSITIVE Sensitive     Extended  ESBL NEGATIVE Sensitive     * >=100,000 COLONIES/mL ESCHERICHIA COLI   Klebsiella pneumoniae - MIC*    AMPICILLIN >=32 RESISTANT Resistant     CEFAZOLIN <=4 SENSITIVE Sensitive     CEFTRIAXONE <=1 SENSITIVE Sensitive     CIPROFLOXACIN <=0.25 SENSITIVE Sensitive     GENTAMICIN <=1 SENSITIVE Sensitive     IMIPENEM <=0.25 SENSITIVE Sensitive     NITROFURANTOIN 32 SENSITIVE Sensitive     TRIMETH/SULFA <=20 SENSITIVE Sensitive  AMPICILLIN/SULBACTAM 4 SENSITIVE Sensitive     PIP/TAZO <=4 SENSITIVE Sensitive     Extended ESBL NEGATIVE Sensitive     * >=100,000 COLONIES/mL KLEBSIELLA PNEUMONIAE      Anti-infectives    Start     Dose/Rate Route Frequency Ordered Stop   06/20/17 0700  cefTRIAXone (ROCEPHIN) 1 g in dextrose 5 % 50 mL IVPB     1 g 100 mL/hr over 30 Minutes Intravenous Every 24 hours 06/19/17 0523     06/20/17 0000  cephALEXin (KEFLEX) 250 MG capsule     250 mg Oral 2 times daily 06/20/17 1341     06/19/17 0200  cefTRIAXone (ROCEPHIN) 1 g in dextrose 5 % 50 mL IVPB     1 g 100 mL/hr over 30 Minutes Intravenous  Once 06/19/17 0153 06/19/17 0606       Radiology Studies: US Renal  Result Date: 06/21/2017 CLINICAL DATA:  Acute kidney injury EXAM: RENAL / URINARY TRACT ULTRASOUND COMPLETE COMPARISON:  None. FINDINGS: Right Kidney: Length: 7.8 cm. Echogenicity within normal limits. No mass or hydronephrosis visualized. Left Kidney: Length: 8.5 cm. Echogenicity within normal limits. No mass or hydronephrosis visualized. Bladder: Bladder is empty. IMPRESSION: Difficult visualization of kidneys due to habitus and difficulty with breath holding. Negative for hydronephrosis. Kidneys are somewhat small in appearance suggesting mild atrophy. Electronically Signed   By: Donavan Foil M.D.   On: 06/21/2017 00:10        Scheduled Meds: . febuxostat  40 mg Oral Daily  . HYDROcodone-acetaminophen  1 tablet Oral Q4H while awake  . levothyroxine  50 mcg Oral QAC breakfast  .  lidocaine  1 patch Transdermal Q24H  . methocarbamol  500 mg Oral QID  . pantoprazole  80 mg Oral Daily  . polyethylene glycol  17 g Oral QHS  . pravastatin  20 mg Oral q1800   Continuous Infusions: . sodium chloride 75 mL/hr at 06/20/17 1652  . cefTRIAXone (ROCEPHIN)  IV 1 g (06/21/17 5521)     LOS: 1 day    Time spent: 21 min    [place], DO [facility] Pager [phone]  If 7PM-7AM, please contact night-coverage [email or web address] Password [email or web address] 06/21/2017, 1:53 PM

## 2017-06-21 NOTE — Progress Notes (Signed)
Educated patient on importance of putting her feet up. Patient refused to lay down  Or put her feet up all night long.

## 2017-06-21 NOTE — Consult Note (Signed)
[facility] Consultation Note Requesting Physician:  Vann, J Primary Nephrologist: Dr. Powell Reason for Consult:  Acute on Chronic Renal Failure  HPI: The patient is a 71 y.o. F with Mhx significant for CKD-3 (BL Cr ~1.6), chronic atrial fibrillation (on Warfarin), HTN (Valsartan-HCTZ), BRCA s/p excision and chronic lymphedema (on Lasix 40mg BID) admitted 06/19/17 for intractable back pain for 3 days. Had similar presentation 723//2018 and felt better after steroids. She saw her PCP day PTA at which time she was given a steroid injection in her back however continued to have worsening pain. She also admitted to 20+ episodes of emesis prior to presentation in the ED. She denied any dysuria or pelvic pain however UA suggested UTI on admission (TNTC WBC, large leukocytes, many bacteria) and was started on Ceftriaxone. She was found to have AKI (Cr 2.0) and was given 500 cc NS bolus.   Since her admission she has been afebrile with stable vital signs. Emesis has resolved. MR L-spine w/o contrast 8/13 negative for acute fracture or misalignment although did show variable degrees of lumbar stenosis, most severe at L5-S1. She continues to have difficulty with pain control and ambulation.   Nephrology has been asked to evaluate the patient due to progressive acute on chronic renal failure. Recent BL Cr 1.6 however was 2.0 on admission. This has steadily rose to 3.3 since admission despite NS @ 75mls/hr. BUN continues to rise as well (41 >> 66). Lytes initially wnl however she developed hyponatremia to 131 8/14. Urine sodium <10 and FENa 0.2% suggesting a pre-renal etiology. Renal US obtained which was technically difficult 2/2 body habitus however kidneys mildly atrophic (Right kidney 7.8cm, Left 8.5cm, each without mass, hydronephrosis or abnormal echogenicity). She is on Valsartan-HCTZ at home as well as Lasix 40mg BID for chronic lymphedema. She has not recently received IV contrast. She does not take NSAIDs at home  (specifically denies Ibuprofen, Aleve/Naproxen use). She reports her son [name] has issues with his kidneys, although difficult to understand exactly what is wrong.   As evidenced by her trend in Scr below, she has had CKD herself (although unaware of that) with baseline Scr of 1.7-2 prior to her admission and by her renal US.  Creatinine  Date/Time Value Ref Range Status     Final   Creatinine, Ser  Date/Time Value Ref Range Status  06/21/2017 02:55 AM 3.31 (H) 0.44 - 1.00 mg/dL Final  06/20/2017 01:55 PM 3.25 (H) 0.44 - 1.00 mg/dL Final  06/20/2017 05:40 AM 2.91 (H) 0.44 - 1.00 mg/dL Final  06/19/2017 09:38 AM 2.18 (H) 0.44 - 1.00 mg/dL Final  06/19/2017 01:02 AM 2.01 (H) 0.44 - 1.00 mg/dL Final  05/29/2017 03:09 PM 1.63 (H) 0.44 - 1.00 mg/dL Final  04/26/2017 08:32 AM 1.85 (H) 0.44 - 1.00 mg/dL Final  03/27/2017 01:12 PM 1.68 (H) 0.44 - 1.00 mg/dL   02/15/2017 08:17 AM 2.0 (H) 0.6 - 1.1 mg/dL Final   Past Medical History:  Past Medical History:  Diagnosis Date  . Arthritis   . Cancer (HCC)    breast cancer  . Chronic atrial fibrillation (HCC)   . Chronic kidney disease    sees Dr Powell  . Cramps, muscle, general   . Dyspnea   . Dyspnea on exertion   . Dysrhythmia   . GERD (gastroesophageal reflux disease)   . Headache   . Hyperlipidemia   . Hypertension   . Hypothyroidism   . Lymphedema   . Moderate to severe pulmonary hypertension (HCC)   .   Obesity    Past Surgical History:  Past Surgical History:  Procedure Laterality Date  . APPENDECTOMY    . BREAST BIOPSY Left 2018  . BREAST LUMPECTOMY WITH NEEDLE LOCALIZATION AND AXILLARY SENTINEL LYMPH NODE BX Left 04/04/2017   Procedure: BREAST LUMPECTOMY WITH NEEDLE LOCALIZATION x3 AND AXILLARY SENTINEL LYMPH NODE BX;  Surgeon: Byerly, Faera, MD;  Location: [facility] OR;  Service: General;  Laterality: Left;  . CATARACT EXTRACTION W/ INTRAOCULAR LENS  IMPLANT, BILATERAL Bilateral 2018  . COLONOSCOPY    . EYE SURGERY    .  GLAUCOMA SURGERY Bilateral 2018  . RE-EXCISION OF BREAST CANCER,SUPERIOR MARGINS Left 04/26/2017   Procedure: RE-EXCISION OF LEFT BREAST CANCER;  Surgeon: Byerly, Faera, MD;  Location: [facility] OR;  Service: General;  Laterality: Left;    Family History:  Family History  Problem Relation Age of Onset  . CAD Mother   . Cancer Neg Hx    Social History:  reports that she has never smoked. She has never used smokeless tobacco. She reports that she does not drink alcohol or use drugs. Moved to the [place] from [place] 20 years ago. Has a son named [name].   Allergies:  Allergies  Allergen Reactions  . Chlorhexidine Gluconate Hives  . Penicillins Itching    Has patient had a PCN reaction causing immediate rash, facial/tongue/throat swelling, SOB or lightheadedness with hypotension: no Has patient had a PCN reaction causing severe rash involving mucus membranes or skin necrosis: No Has patient had a PCN reaction that required hospitalization: No Has patient had a PCN reaction occurring within the last 10 years: No If all of the above answers are "NO", then may proceed with Cephalosporin use.    Home medications: Prior to Admission medications   Medication Sig Start Date End Date Taking? Authorizing Provider  febuxostat (ULORIC) 40 MG tablet Take 40 mg by mouth daily.   Yes [provider]  furosemide (LASIX) 40 MG tablet Take 40 mg by mouth 2 (two) times daily.  02/06/16  Yes [provider]  KLOR-CON M20 20 MEQ tablet Take 20 mEq by mouth daily.  12/07/15  Yes [provider]  levothyroxine (SYNTHROID, LEVOTHROID) 50 MCG tablet Take 50 mcg by mouth daily before breakfast.  02/06/16  Yes [provider]  lipase/protease/amylase (CREON) 12000 units CPEP capsule Take 12,000 Units by mouth daily as needed (stomach problems).    Yes [provider]  lovastatin (MEVACOR) 20 MG tablet Take 20 mg by mouth daily.  02/06/16  Yes [provider]  omeprazole  (PRILOSEC) 40 MG capsule Take 40 mg by mouth daily as needed (acid reflux).  02/06/16  Yes [provider]  polyethylene glycol powder (GLYCOLAX/MIRALAX) powder Take 17 g by mouth at bedtime.  02/06/16  Yes [provider]  valsartan-hydrochlorothiazide (DIOVAN-HCT) 320-25 MG tablet Take 1 tablet by mouth daily.  02/06/16  Yes [provider]  warfarin (COUMADIN) 5 MG tablet Take 5 mg by mouth daily.  02/06/16  Yes [provider]  cephALEXin (KEFLEX) 250 MG capsule Take 1 capsule (250 mg total) by mouth 2 (two) times daily. 06/20/17   Vann, Jessica U, DO  HYDROcodone-acetaminophen (NORCO/VICODIN) 5-325 MG tablet Take 1 tablet by mouth every 4 (four) hours as needed for moderate pain (can take 2 before bed). 06/20/17 07/10/17  Vann, Jessica U, DO  methocarbamol (ROBAXIN) 500 MG tablet Take 1 tablet (500 mg total) by mouth 4 (four) times daily. 06/20/17   Vann, Jessica U, DO      Inpatient medications: . febuxostat  40 mg Oral Daily  . HYDROcodone-acetaminophen  1 tablet Oral Q4H while awake  . levothyroxine  50 mcg Oral QAC breakfast  . lidocaine  1 patch Transdermal Q24H  . methocarbamol  500 mg Oral QID  . pantoprazole  80 mg Oral Daily  . polyethylene glycol  17 g Oral QHS  . pravastatin  20 mg Oral q1800   Review of Systems: Negative except otherwise listed in HPI.   Physical Exam:  Blood pressure 131/65, pulse 62, temperature 97.8 F (36.6 C), temperature source Oral, resp. rate 17, height 5' 2" (1.575 m), weight 277 lb (125.6 kg), SpO2 100 %.  Gen: Obese caucasian woman resting comfortably in bedside chair. NAD. Legs elevated. Pleasant and conversant, though not entirely fluent in [place].  Lines/tubes: Right PIV 8/13 Skin: Thin. No rash, cyanosis Chest: IRIR but rate controlled. No murmur appreciated.  Pulmonary: CTA BL with normal WOB on RA. Able to speak in complete sentences.  Abdomen: soft, non tender and not distended. +bs Ext: lymphedema all 4  extremities. No pitting edema appreciated. No cyanosis.  Dialysis Access: None.  Filed Weights   06/19/17 0500  Weight: 277 lb (125.6 kg)    Intake/Output Summary (Last 24 hours) at 06/21/17 1532 Last data filed at 06/21/17 1500  Gross per 24 hour  Intake             3860 ml  Output              450 ml  Net             3410 ml   Labs: Renal Panel:  Recent Labs Lab 06/19/17 0102 06/19/17 0938 06/20/17 0540 06/20/17 1355 06/21/17 0255  NA 134* 135 135 131* 132*  K 2.9* 3.6 4.3 4.6 4.4  CL 96* 97* 101 99* 97*  CO2 _0 19* 22  GLUCOSE 121* 125* 141* 164* 118*  BUN 42* 41* 54* 62* 66*  CREATININE 2.01* 2.18* 2.91* 3.25* 3.31*  CALCIUM 9.5 9.3 9.7 9.3 9.5   Liver Function Tests:  Recent Labs Lab 06/19/17 0102  AST 24  ALT 25  ALKPHOS 64  BILITOT 1.1  PROT 7.1  ALBUMIN 3.4*    Recent Labs Lab 06/19/17 0102  LIPASE 29   CBC:  Recent Labs Lab 06/19/17 0102 06/20/17 0540  WBC 7.1 8.2  NEUTROABS 5.1  --   HGB 11.6* 11.8*  HCT 35.3* 37.1  MCV 87.4 88.1  PLT 218 224    Recent Labs Lab 06/19/17 0102 06/19/17 0257  TROPONINI 0.03* 0.04*   Xrays/Other Studies: US Renal  Result Date: 06/21/2017 CLINICAL DATA:  Acute kidney injury EXAM: RENAL / URINARY TRACT ULTRASOUND COMPLETE COMPARISON:  None. FINDINGS: Right Kidney: Length: 7.8 cm. Echogenicity within normal limits. No mass or hydronephrosis visualized. Left Kidney: Length: 8.5 cm. Echogenicity within normal limits. No mass or hydronephrosis visualized. Bladder: Bladder is empty. IMPRESSION: Difficult visualization of kidneys due to habitus and difficulty with breath holding. Negative for hydronephrosis. Kidneys are somewhat small in appearance suggesting mild atrophy. Electronically Signed   By: Donavan Foil M.D.   On: 06/21/2017 00:10    Background: 71 y/o F with CKD-3 (Bl Cr 1.6), HTN, AFib and chronic pain admitted 8/13 with intractable back pain found to have UTI and Acute on Chronic Renal  Failure following multiple episodes of emesis. She also has hx of DM2, HTN. Follows with Dr. Florene Glen as an outpatient.   Assessment/Recommendations  1.  Acute on Chronic Renal Failure, Pre-Renal Azotemia: Bl Cr 1.6 (most recently 05/2017), 2.01 on admission, now 3.3. VSS and she is without hypotension during her hospitalization so far. Renal US with mildly atrophic kidneys. She denies any use of NSAIDs at home although is on Valsartan-HCTZ and Lasix 40mg BID at home for chronic lymphedema. Does endorse about 20 episodes of emesis prior to admission. BUN:CR ratio 20, suggesting Pre-renal etiology. In addition, FENa obtained yesterday 0.2% also suggesting this.  1. Good recorded UOP, several unmeasured  2. Hyponatremia, mild: Normal on admission however mild at 132 right now. Urine sodium <10 (appropriate renal response) and FENa 0.2% suggesting a pre-renal etiology. DDx for this includes hypovolemia, heart failure, hemorrhage, or recent contrast exposure. Given her hx, she is likely volume deplete from continued emesis.  1. Emesis resolved 2. IVF replacement 3. Monitor renal function 4. May need to add lasix if sodium drops however should respond to IVF's given her low FeNa. 3. Hyponatremia: 2.9 on admission, resolved with PO replacement. Likely related to volume loss from emesis 1. Monitor renal function, replace as indicated 4. UTI: UA suggesting UTI however she denies dysuria or pelvic pain. Back pain could be in part due to acute UTI. Ucx so far with >100,000 Klebsiella and E.coli, both sensitive to Ceftriaxone.  1. Continue Ceftriaxone 2. Will need kidney-friendly agent on dc 5. HTN: Normotensive during admission. She appears to be on Valsartan-HCTZ 320mg-25mg daily as an outpatient which was held on admission.  1. BP currently controlled with PRN hydral. Has not required any so far. 6. LBP: Current regimen Norco 5-325mg Q4H. Continue to avoid nephrotoxic agents 7. Normocytic Anemia: at 11.8, Hb  appears to be her baseline. Monitor, transfuse PRN.  1. Could consider checking iron studies and replace if indicated  Bethany Molt, DO Internal Medicine - PGY2  [facility] 06/21/2017, 12:51 PM  I have seen and examined this patient and agree with plan and assessment in the above note with renal recommendations/intervention highlighted.  Pt with low FeNa and hypovolemia in setting of ARB therapy, most consistent with ischemic ATN as cause of her AKI/CKD.  No indication for dialysis and will continue with volume replacement and follow UOP and daily Scr levels.    A ,MD 06/21/2017 4:55 PM   

## 2017-06-22 ENCOUNTER — Ambulatory Visit: Payer: Medicare HMO

## 2017-06-22 LAB — CBC
HCT: 35.6 % — ABNORMAL LOW (ref 36.0–46.0)
Hemoglobin: 11.2 g/dL — ABNORMAL LOW (ref 12.0–15.0)
MCH: 28 pg (ref 26.0–34.0)
MCHC: 31.5 g/dL (ref 30.0–36.0)
MCV: 89 fL (ref 78.0–100.0)
PLATELETS: 203 10*3/uL (ref 150–400)
RBC: 4 MIL/uL (ref 3.87–5.11)
RDW: 15 % (ref 11.5–15.5)
WBC: 7.9 10*3/uL (ref 4.0–10.5)

## 2017-06-22 LAB — BASIC METABOLIC PANEL
Anion gap: 10 (ref 5–15)
BUN: 62 mg/dL — ABNORMAL HIGH (ref 6–20)
CHLORIDE: 100 mmol/L — AB (ref 101–111)
CO2: 21 mmol/L — ABNORMAL LOW (ref 22–32)
Calcium: 9.5 mg/dL (ref 8.9–10.3)
Creatinine, Ser: 2.5 mg/dL — ABNORMAL HIGH (ref 0.44–1.00)
GFR calc non Af Amer: 18 mL/min — ABNORMAL LOW (ref >60)
GFR, EST AFRICAN AMERICAN: 21 mL/min — AB (ref >60)
Glucose, Bld: 127 mg/dL — ABNORMAL HIGH (ref 65–99)
POTASSIUM: 4.2 mmol/L (ref 3.5–5.1)
Sodium: 131 mmol/L — ABNORMAL LOW (ref 135–145)

## 2017-06-22 LAB — PROTIME-INR
INR: 1.49
PROTHROMBIN TIME: 18.2 s — AB (ref 11.4–15.2)

## 2017-06-22 MED ORDER — SENNOSIDES-DOCUSATE SODIUM 8.6-50 MG PO TABS
1.0000 | ORAL_TABLET | Freq: Two times a day (BID) | ORAL | Status: DC
  Administered 2017-06-22 – 2017-06-23 (×4): 1 via ORAL
  Filled 2017-06-22 (×4): qty 1

## 2017-06-22 MED ORDER — BISACODYL 10 MG RE SUPP
RECTAL | Status: AC
  Filled 2017-06-22: qty 1

## 2017-06-22 MED ORDER — BISACODYL 10 MG RE SUPP
10.0000 mg | Freq: Once | RECTAL | Status: AC
  Administered 2017-06-22: 10 mg via RECTAL

## 2017-06-22 MED ORDER — TRAMADOL HCL 50 MG PO TABS
50.0000 mg | ORAL_TABLET | Freq: Once | ORAL | Status: AC
Start: 2017-06-22 — End: 2017-06-22
  Administered 2017-06-22: 50 mg via ORAL
  Filled 2017-06-22: qty 1

## 2017-06-22 MED ORDER — SIMETHICONE 80 MG PO CHEW
80.0000 mg | CHEWABLE_TABLET | Freq: Once | ORAL | Status: AC
  Administered 2017-06-22: 80 mg via ORAL
  Filled 2017-06-22: qty 1

## 2017-06-22 MED ORDER — PREDNISONE 50 MG PO TABS
50.0000 mg | ORAL_TABLET | Freq: Every day | ORAL | Status: DC
  Administered 2017-06-22 – 2017-06-23 (×2): 50 mg via ORAL
  Filled 2017-06-22 (×2): qty 1

## 2017-06-22 NOTE — Progress Notes (Signed)
PROGRESS NOTE    Katelyn Jackson  CLE:[id number] DOB: [birth year] DOA: 06/18/2017 PCP: Sandi Mariscal, MD   Outpatient Specialists:     Brief Narrative:  Katelyn Jackson is a 71 y.o. female with medical history significant of Chronic A.Fib on coumadin, HTN, BRCA s/p excision on radiation.  Patient presents to the ED with L sided "sciatic nerve pain" that has been ongoing for past 3 days.  Pain radiates to L leg.  History of same and was seen in ED on July 23rd and felt better after course of steroids.  Symptoms returned and saw PCP, given a IM steroid injection.  Day of admission had severe pain and vomited more than 20 times.  Found to have AKI that worsened during stay from lasix/dehydration and an UTI. An epidural was attempted to help with lower back pain but this was unable to be arranged in the hospital. This was be done at the spine center after discharge.  Coumadin on hold for this.   Assessment & Plan:   Principal Problem:   Intractable low back pain Active Problems:   Essential hypertension   Chronic atrial fibrillation (HCC)   AKI (acute kidney injury) (HCC)   Hypokalemia   Acute lower UTI   Intractable low back pain - sciatica Norco scheduled Robaxin scheduled Lidocaine patch Attempted to arrange steroid injection but unable to get done in hospital- will have to do outpatient --- will start PO steroids since not to inject      -MRI showed: Mild foraminal stenosis from L3-4 through L5-S1 with moderate to severe left L5-S1 foraminal  stenosis.  AKI on CKD stage 3 - - IVF- still elevated but appears to have slowed down -appears to be pre-renal -renal U/S shows small kidneys -renal consult appreciated -Cr trending down  Hypokalemia - replaced K  HTN -  -hold diuretic  Chronic A.Fib - Stop coumadin in anticipation of procedure  UTI - Rocephin Culture pending- ecoli + klebsiella  Breast cancer  -unable to do scheduled radiation as unable to lay on  back  Constipation  -PO medications  -ambulation encouraged  Chronic lymphedema  -encouraged patient to elevate LE extremities  DVT prophylaxis:  scd  Code Status: Full Code   Family Communication: son  Disposition Plan:     Consultants:   IR  renal   Subjective: C/o "gas" pain Back hurts too much to lay flat  Objective: Vitals:   06/21/17 0512 06/21/17 1421 06/21/17 2026 06/22/17 0440  BP: 131/65 133/67 130/80 119/82  Pulse: 62 88 88 94  Resp: 17 17 17 17   Temp: 97.8 F (36.6 C) 97.9 F (36.6 C) (!) 97.5 F (36.4 C) 98 F (36.7 C)  TempSrc: Oral Oral Oral Oral  SpO2: 100% 100% 100% 100%  Weight:      Height:        Intake/Output Summary (Last 24 hours) at 06/22/17 1322 Last data filed at 06/22/17 1051  Gross per 24 hour  Intake             2430 ml  Output              700 ml  Net             1730 ml   Filed Weights   06/19/17 0500  Weight: 125.6 kg (277 lb)    Examination:  General exam: uncomfortable appearing Respiratory system: diminished due to size, no wheezing Cardiovascular system: rrr Gastrointestinal system: +BS, sluggish, no pain Central nervous system: A+Ox3-  speak in russian but able to understand english Extremities: increased edema and tightness in LE     Data Reviewed: I have personally reviewed following labs and imaging studies  CBC:  Recent Labs Lab 06/19/17 0102 06/20/17 0540 06/22/17 0559  WBC 7.1 8.2 7.9  NEUTROABS 5.1  --   --   HGB 11.6* 11.8* 11.2*  HCT 35.3* 37.1 35.6*  MCV 87.4 88.1 89.0  PLT 218 224 564   Basic Metabolic Panel:  Recent Labs Lab 06/19/17 0938 06/20/17 0540 06/20/17 1355 06/21/17 0255 06/22/17 0559  NA 135 135 131* 132* 131*  K 3.6 4.3 4.6 4.4 4.2  CL 97* 101 99* 97* 100*  CO2 26 24 19* 22 21*  GLUCOSE 125* 141* 164* 118* 127*  BUN 41* 54* 62* 66* 62*  CREATININE 2.18* 2.91* 3.25* 3.31* 2.50*  CALCIUM 9.3 9.7 9.3 9.5 9.5   GFR: Estimated Creatinine Clearance: 26.2  mL/min (A) (by C-G formula based on SCr of 2.5 mg/dL (H)). Liver Function Tests:  Recent Labs Lab 06/19/17 0102  AST 24  ALT 25  ALKPHOS 64  BILITOT 1.1  PROT 7.1  ALBUMIN 3.4*    Recent Labs Lab 06/19/17 0102  LIPASE 29   No results for input(s): AMMONIA in the last 168 hours. Coagulation Profile:  Recent Labs Lab 06/19/17 0102 06/19/17 1225 06/20/17 0540 06/21/17 0255 06/22/17 0559  INR 1.25 1.28 1.39 1.52 1.49   Cardiac Enzymes:  Recent Labs Lab 06/19/17 0102 06/19/17 0257  TROPONINI 0.03* 0.04*   BNP (last 3 results) No results for input(s): PROBNP in the last 8760 hours. HbA1C: No results for input(s): HGBA1C in the last 72 hours. CBG: No results for input(s): GLUCAP in the last 168 hours. Lipid Profile: No results for input(s): CHOL, HDL, LDLCALC, TRIG, CHOLHDL, LDLDIRECT in the last 72 hours. Thyroid Function Tests: No results for input(s): TSH, T4TOTAL, FREET4, T3FREE, THYROIDAB in the last 72 hours. Anemia Panel: No results for input(s): VITAMINB12, FOLATE, FERRITIN, TIBC, IRON, RETICCTPCT in the last 72 hours. Urine analysis:    Component Value Date/Time   COLORURINE YELLOW 06/19/2017 0123   APPEARANCEUR HAZY (A) 06/19/2017 0123   LABSPEC 1.012 06/19/2017 0123   PHURINE 5.0 06/19/2017 0123   GLUCOSEU NEGATIVE 06/19/2017 0123   HGBUR SMALL (A) 06/19/2017 0123   BILIRUBINUR NEGATIVE 06/19/2017 0123   KETONESUR NEGATIVE 06/19/2017 0123   PROTEINUR NEGATIVE 06/19/2017 0123   NITRITE NEGATIVE 06/19/2017 0123   LEUKOCYTESUR LARGE (A) 06/19/2017 0123     ) Recent Results (from the past 240 hour(s))  Urine Culture     Status: Abnormal   Collection Time: 06/19/17  1:23 AM  Result Value Ref Range Status   Specimen Description URINE, RANDOM  Final   Special Requests CX ADDED AT 0214 ON [id number]  Final   Culture (A)  Final    >=100,000 COLONIES/mL KLEBSIELLA PNEUMONIAE >=100,000 COLONIES/mL ESCHERICHIA COLI    Report Status 06/21/2017 FINAL   Final   Organism ID, Bacteria KLEBSIELLA PNEUMONIAE (A)  Final   Organism ID, Bacteria ESCHERICHIA COLI (A)  Final      Susceptibility   Escherichia coli - MIC*    AMPICILLIN >=32 RESISTANT Resistant     CEFAZOLIN <=4 SENSITIVE Sensitive     CEFTRIAXONE <=1 SENSITIVE Sensitive     CIPROFLOXACIN <=0.25 SENSITIVE Sensitive     GENTAMICIN <=1 SENSITIVE Sensitive     IMIPENEM <=0.25 SENSITIVE Sensitive     NITROFURANTOIN <=16 SENSITIVE Sensitive     TRIMETH/SULFA <=  20 SENSITIVE Sensitive     AMPICILLIN/SULBACTAM 16 INTERMEDIATE Intermediate     PIP/TAZO <=4 SENSITIVE Sensitive     Extended ESBL NEGATIVE Sensitive     * >=100,000 COLONIES/mL ESCHERICHIA COLI   Klebsiella pneumoniae - MIC*    AMPICILLIN >=32 RESISTANT Resistant     CEFAZOLIN <=4 SENSITIVE Sensitive     CEFTRIAXONE <=1 SENSITIVE Sensitive     CIPROFLOXACIN <=0.25 SENSITIVE Sensitive     GENTAMICIN <=1 SENSITIVE Sensitive     IMIPENEM <=0.25 SENSITIVE Sensitive     NITROFURANTOIN 32 SENSITIVE Sensitive     TRIMETH/SULFA <=20 SENSITIVE Sensitive     AMPICILLIN/SULBACTAM 4 SENSITIVE Sensitive     PIP/TAZO <=4 SENSITIVE Sensitive     Extended ESBL NEGATIVE Sensitive     * >=100,000 COLONIES/mL KLEBSIELLA PNEUMONIAE      Anti-infectives    Start     Dose/Rate Route Frequency Ordered Stop   06/20/17 0700  cefTRIAXone (ROCEPHIN) 1 g in dextrose 5 % 50 mL IVPB     1 g 100 mL/hr over 30 Minutes Intravenous Every 24 hours 06/19/17 0523     06/20/17 0000  cephALEXin (KEFLEX) 250 MG capsule     250 mg Oral 2 times daily 06/20/17 1341     06/19/17 0200  cefTRIAXone (ROCEPHIN) 1 g in dextrose 5 % 50 mL IVPB     1 g 100 mL/hr over 30 Minutes Intravenous  Once 06/19/17 0153 06/19/17 0606       Radiology Studies: US Renal  Result Date: 06/21/2017 CLINICAL DATA:  Acute kidney injury EXAM: RENAL / URINARY TRACT ULTRASOUND COMPLETE COMPARISON:  None. FINDINGS: Right Kidney: Length: 7.8 cm. Echogenicity within normal limits. No  mass or hydronephrosis visualized. Left Kidney: Length: 8.5 cm. Echogenicity within normal limits. No mass or hydronephrosis visualized. Bladder: Bladder is empty. IMPRESSION: Difficult visualization of kidneys due to habitus and difficulty with breath holding. Negative for hydronephrosis. Kidneys are somewhat small in appearance suggesting mild atrophy. Electronically Signed   By: Donavan Foil M.D.   On: 06/21/2017 00:10        Scheduled Meds: . febuxostat  40 mg Oral Daily  . HYDROcodone-acetaminophen  1 tablet Oral Q4H while awake  . levothyroxine  50 mcg Oral QAC breakfast  . lidocaine  1 patch Transdermal Q24H  . methocarbamol  500 mg Oral QID  . pantoprazole  80 mg Oral Daily  . polyethylene glycol  17 g Oral QHS  . pravastatin  20 mg Oral q1800  . predniSONE  50 mg Oral Q breakfast  . senna-docusate  1 tablet Oral BID   Continuous Infusions: . sodium chloride 50 mL/hr at 06/22/17 1117  . cefTRIAXone (ROCEPHIN)  IV Stopped (06/22/17 0615)     LOS: 2 days    Time spent: 71 min    [place], DO [facility] Pager [phone]  If 7PM-7AM, please contact night-coverage [email or web address] Password [email or web address] 06/22/2017, 1:22 PM

## 2017-06-22 NOTE — Progress Notes (Signed)
Pt offered the opportunity to be transferred to [facility] today for radiation therapy but she declined citing inability to lay down during the procedure. Pt also declined to ambulate on the unit citing the same reason. Stated that her son will walk with her this evening

## 2017-06-22 NOTE — Progress Notes (Signed)
Patient and patient's son noted swelling in patient's legs have increased. Patient requested additional pain medication. Notified Hal Hope of patient's condition and request for additional pain medication. One time dose of tramadol 50mg  PO ordered at this time. Patient also requested additional medication to stimulate bowel movement, as miralax did not help her earlier. Notified Hal Hope, who then ordered one time dose dulcolax suppository. Dulcolax suppository and tramadol administered to patient. At this time, patient refuses to elevate her legs, stating it hurts to elevate her legs. Explained the importance of elevating the legs to the patient and the patient's son.

## 2017-06-22 NOTE — Progress Notes (Signed)
Physical Therapy Treatment Patient Details Name: Katelyn Jackson MRN: [id number] DOB: 31-Dec-1945 Today's Date: 06/22/2017    History of Present Illness 71 yo with PMHx: Chronic A.Fib on coumadin, HTN, HLD, breast CA s/p excision on radiation.  Patient presents to the ED with L sided "sciatic nerve pain" and positive UTI. Pt with steroid injection in back 8/11    PT Comments    Today's skilled session focused on ambulation and pt and family edu. Pt's ambulation distance limited today by pain. Bil LEs swollen, pt and daughter in law educated on the importance of elevation and ambulation to reduce edema. Patient would benefit from continued skilled PT to increase activity tolerance and increase functional independence. Will continue to follow acutely.      Follow Up Recommendations  Home health PT     Equipment Recommendations  Rolling walker with 5" wheels    Recommendations for Other Services       Precautions / Restrictions Precautions Precautions: Fall Restrictions Weight Bearing Restrictions: No    Mobility  Bed Mobility               General bed mobility comments: Pt in chair on arrival. Reports she is too uncomfortable in bed and slept in chair.  Transfers Overall transfer level: Needs assistance Equipment used: Rolling walker (2 wheeled) Transfers: Sit to/from Stand Sit to Stand: Min guard         General transfer comment: Min guard to power up from recliner. Pt using arm rest. Min guard for safety as pt reported increased pain today.  Ambulation/Gait Ambulation/Gait assistance: Supervision Ambulation Distance (Feet): 20 Feet Assistive device: Rolling walker (2 wheeled) Gait Pattern/deviations: Step-through pattern;Decreased stride length;Trunk flexed     General Gait Details: Pt ambulation distance limited today by pain. Pt reports burning feeling in low back and LLE during ambulation   Stairs         General stair comments: Not safe to  attempt today.  Wheelchair Mobility    Modified Rankin (Stroke Patients Only)       Balance Overall balance assessment: Needs assistance Sitting-balance support: No upper extremity supported Sitting balance-Leahy Scale: Good     Standing balance support: Bilateral upper extremity supported Standing balance-Leahy Scale: Fair Standing balance comment: reliant on RW for support                            Cognition Arousal/Alertness: Awake/alert Behavior During Therapy: WFL for tasks assessed/performed Overall Cognitive Status: Within Functional Limits for tasks assessed                                        Exercises      General Comments General comments (skin integrity, edema, etc.): Bil LE swollen. Educated pt on the importance of elevating LE. Pt reports she is unable to elevate for long secondary to pain. Attempted pillow under knees to relieve pressure on low back. Pt reports no improvement. Advised elevating for 10 min periods with rest breaks inbetween. Pt agreeable. Pt also educated on the benefits of walking to reduce edema. Daughter in law present for session and interperted for pt.      Pertinent Vitals/Pain Pain Assessment: 0-10 Pain Score: 8  Pain Location: low back radiating down the R LE Pain Descriptors / Indicators: Sore;Radiating;Burning Pain Intervention(s): Monitored during session;Limited activity within patient's tolerance;Repositioned  Home Living                      Prior Function            PT Goals (current goals can now be found in the care plan section) Acute Rehab PT Goals Patient Stated Goal: return home PT Goal Formulation: With patient Time For Goal Achievement: 06/27/17 Potential to Achieve Goals: Good Progress towards PT goals: Progressing toward goals    Frequency    Min 3X/week      PT Plan Current plan remains appropriate    Co-evaluation              AM-PAC PT "6  Clicks" Daily Activity  Outcome Measure  Difficulty turning over in bed (including adjusting bedclothes, sheets and blankets)?: A Little Difficulty moving from lying on back to sitting on the side of the bed? : A Little Difficulty sitting down on and standing up from a chair with arms (e.g., wheelchair, bedside commode, etc,.)?: A Little Help needed moving to and from a bed to chair (including a wheelchair)?: A Little Help needed walking in hospital room?: A Little Help needed climbing 3-5 steps with a railing? : A Lot 6 Click Score: 17    End of Session Equipment Utilized During Treatment: Gait belt Activity Tolerance: Patient tolerated treatment well Patient left: in chair;with call bell/phone within reach;with family/visitor present Nurse Communication: Mobility status PT Visit Diagnosis: Other abnormalities of gait and mobility (R26.89);Pain;Difficulty in walking, not elsewhere classified (R26.2) Pain - Right/Left: Left Pain - part of body: Leg     Time: 9983-3825 PT Time Calculation (min) (ACUTE ONLY): 23 min  Charges:  $Gait Training: 8-22 mins $Therapeutic Activity: 8-22 mins                    G Codes:       Benjiman Core, [place] Pager [phone] Acute Rehab    Allena Katz 06/22/2017, 1:05 PM

## 2017-06-22 NOTE — Progress Notes (Signed)
[facility] Rounding Note  Brief HPI: 71 y/o F with CKD3 (BL Cr 1.6-1.8), AFib, HTN (On Valsartan-HCTZ outpatient) and chronic lymphedema (On lasix at home) admitted 8/13 with intractable back pain. Endorses 20 episodes of emesis prior to admission and UA suggested UTI. Was found to have worsening renal function (2 > 3.3) and nephrology was consulted. Felt to be pre-renal in nature, offending agents held and she is getting IVF.   Subjective/Interval events: Complaints of worsening BL leg swelling. Unable to elevate >5 mins secondary to "butt pain." Cr improved to 2.5. Still on NS @ 44mls/hr. +6L since adm.  Inpatient medications: . febuxostat  40 mg Oral Daily  . HYDROcodone-acetaminophen  1 tablet Oral Q4H while awake  . levothyroxine  50 mcg Oral QAC breakfast  . lidocaine  1 patch Transdermal Q24H  . methocarbamol  500 mg Oral QID  . pantoprazole  80 mg Oral Daily  . polyethylene glycol  17 g Oral QHS  . pravastatin  20 mg Oral q1800    Physical Exam:  Blood pressure 119/82, pulse 94, temperature 98 F (36.7 C), temperature source Oral, resp. rate 17, height 5\' 2"  (1.575 m), weight 277 lb (125.6 kg), SpO2 100 %.  Gen: Obese caucasian woman resting comfortably in bedside chair. Pleasant and conversant, though not entirely fluent in [place].  Lines/tubes: Right PIV 8/13 Chest: IRIR but rate controlled. No murmur appreciated.  Pulmonary: CTA BL with normal WOB on RA. Able to speak in complete sentences.  Abdomen: soft, non tender and not distended. +bs Ext: lymphedema all 4 extremities. Not pitting. No cyanosis.  Dialysis Access: None.  Filed Weights   06/19/17 0500  Weight: 277 lb (125.6 kg)    Intake/Output Summary (Last 24 hours) at 06/22/17 0912 Last data filed at 06/22/17 0446  Gross per 24 hour  Intake             3690 ml  Output              700 ml  Net             2990 ml   Labs: Renal Panel:  Recent Labs Lab 06/19/17 0102 06/19/17 0938 06/20/17 0540 06/20/17 1355  06/21/17 0255 06/22/17 0559  NA 134* 135 135 131* 132* 131*  K 2.9* 3.6 4.3 4.6 4.4 4.2  CL 96* 97* 101 99* 97* 100*  CO2 25 26 24  19* 22 21*  GLUCOSE 121* 125* 141* 164* 118* 127*  BUN 42* 41* 54* 62* 66* 62*  CREATININE 2.01* 2.18* 2.91* 3.25* 3.31* 2.50*  CALCIUM 9.5 9.3 9.7 9.3 9.5 9.5   Liver Function Tests:  Recent Labs Lab 06/19/17 0102  AST 24  ALT 25  ALKPHOS 64  BILITOT 1.1  PROT 7.1  ALBUMIN 3.4*    Recent Labs Lab 06/19/17 0102  LIPASE 29   CBC:  Recent Labs Lab 06/19/17 0102 06/20/17 0540 06/22/17 0559  WBC 7.1 8.2 7.9  NEUTROABS 5.1  --   --   HGB 11.6* 11.8* 11.2*  HCT 35.3* 37.1 35.6*  MCV 87.4 88.1 89.0  PLT 218 224 203    Recent Labs Lab 06/19/17 0102 06/19/17 0257  TROPONINI 0.03* 0.04*   Xrays/Other Studies: US Renal  Result Date: 06/21/2017 CLINICAL DATA:  Acute kidney injury EXAM: RENAL / URINARY TRACT ULTRASOUND COMPLETE COMPARISON:  None. FINDINGS: Right Kidney: Length: 7.8 cm. Echogenicity within normal limits. No mass or hydronephrosis visualized. Left Kidney: Length: 8.5 cm. Echogenicity within normal limits. No mass  or hydronephrosis visualized. Bladder: Bladder is empty. IMPRESSION: Difficult visualization of kidneys due to habitus and difficulty with breath holding. Negative for hydronephrosis. Kidneys are somewhat small in appearance suggesting mild atrophy. Electronically Signed   By: Donavan Foil M.D.   On: 06/21/2017 00:10    Background: 71 y/o F with CKD-3 (Bl Cr 1.6), HTN, AFib and chronic pain admitted 8/13 with intractable back pain found to have UTI and Acute on Chronic Renal Failure following multiple episodes of emesis. She also has hx of DM2, HTN. Follows with Dr. Florene Glen as an outpatient.   Assessment/Recommendations  1. Acute on Chronic Renal Failure, Pre-Renal Azotemia: Bl Cr 1.-1.8 (most recently 05/2017), 2.01 > 3.3 during admission however now 2.5 with IVF replacement. Renal US with mildly atrophic kidneys. On  Valsartan-HCTZ and Lasix 40mg  BID at home for HTN and chronic lymphedema. Does endorse about 20 episodes of emesis prior to admission. BUN:CR ratio 20, suggesting Pre-renal etiology. In addition, FENa obtained yesterday 0.2% also suggesting this.  1. Good recorded UOP, several unmeasured  2. Cr down trending, continue to monitor 3. Decrease IVF to 28mls/hr x 10 more hours.  2. Hyponatremia, mild: Normal on admission however mild at 131 right now. Urine sodium <10 (appropriate renal response) and FENa 0.2% suggesting a pre-renal etiology. Given her hx, she is likely volume deplete from continued emesis.  1. Emesis resolved 2. IVF replacement 3. Monitor renal function 4. May need to add lasix if sodium drops however should respond to IVF's given her low FeNa. 3. HypoK: 2.9 on admission, resolved with PO replacement. Likely related to volume loss from emesis 1. Monitor renal function, replace as indicated 4. UTI: UA suggesting UTI however she denies dysuria or pelvic pain. Back pain could be in part due to acute UTI. Ucx so far with >100,000 Klebsiella and E.coli, both sensitive to Ceftriaxone.  1. Continue Ceftriaxone 2. Will need kidney-friendly agent on dc 5. HTN: Normotensive during admission. She appears to be on Valsartan-HCTZ 320mg -25mg  daily as an outpatient which was held on admission.  1. BP currently controlled with PRN hydral. Has not required any so far. 6. LBP: Current regimen Norco 5-325mg  Q4H. Continue to avoid nephrotoxic agents 7. Normocytic Anemia: at 11.2, Hb appears to be her baseline. Monitor, transfuse PRN.   Einar Gip, DO Internal Medicine - PGY2 [facility] 06/22/2017, 9:12 AM  I have seen and examined this patient and agree with plan and assessment in the above note with renal recommendations/intervention highlighted.  Scr improving toward baseline.  Decrease rate of IVF's and stop later today given lower ext edema.  Hopefully Scr will continue to  improve after holding ARB and gentle hydration for low FeNa and bland urine sediment.  Broadus John A Bearl Talarico,MD 06/22/2017 12:40 PM

## 2017-06-23 ENCOUNTER — Ambulatory Visit: Payer: Medicare HMO

## 2017-06-23 DIAGNOSIS — M545 Low back pain: Secondary | ICD-10-CM

## 2017-06-23 LAB — PROTIME-INR
INR: 1.53
PROTHROMBIN TIME: 18.5 s — AB (ref 11.4–15.2)
Prothrombin Time: >90 seconds — ABNORMAL HIGH (ref 11.4–15.2)

## 2017-06-23 LAB — RENAL FUNCTION PANEL
Albumin: 3.3 g/dL — ABNORMAL LOW (ref 3.5–5.0)
Albumin: 3.2 g/dL — ABNORMAL LOW (ref 3.5–5.0)
Anion gap: 10 (ref 5–15)
Anion gap: 11 (ref 5–15)
BUN: 57 mg/dL — ABNORMAL HIGH (ref 6–20)
BUN: 56 mg/dL — AB (ref 6–20)
CALCIUM: 9.5 mg/dL (ref 8.9–10.3)
CHLORIDE: 100 mmol/L — AB (ref 101–111)
CO2: 20 mmol/L — ABNORMAL LOW (ref 22–32)
CO2: 21 mmol/L — AB (ref 22–32)
CREATININE: 2.25 mg/dL — AB (ref 0.44–1.00)
CREATININE: 2.32 mg/dL — AB (ref 0.44–1.00)
Calcium: 9.5 mg/dL (ref 8.9–10.3)
Chloride: 101 mmol/L (ref 101–111)
GFR calc Af Amer: 23 mL/min — ABNORMAL LOW (ref >60)
GFR calc non Af Amer: 20 mL/min — ABNORMAL LOW (ref >60)
GFR, EST AFRICAN AMERICAN: 24 mL/min — AB (ref >60)
GFR, EST NON AFRICAN AMERICAN: 21 mL/min — AB (ref >60)
GLUCOSE: 98 mg/dL (ref 65–99)
Glucose, Bld: 109 mg/dL — ABNORMAL HIGH (ref 65–99)
PHOSPHORUS: 3.7 mg/dL (ref 2.5–4.6)
Phosphorus: 3.6 mg/dL (ref 2.5–4.6)
Potassium: 4.4 mmol/L (ref 3.5–5.1)
Potassium: 4.3 mmol/L (ref 3.5–5.1)
SODIUM: 131 mmol/L — AB (ref 135–145)
Sodium: 132 mmol/L — ABNORMAL LOW (ref 135–145)

## 2017-06-23 NOTE — Care Management Important Message (Signed)
Important Message  Patient Details  Name: Katelyn Jackson MRN: [id number] Date of Birth: 1946-11-07   Medicare Important Message Given:  Yes    [name] May 06/23/2017, 11:23 AM

## 2017-06-23 NOTE — Progress Notes (Signed)
Physical Therapy Treatment Patient Details Name: Katelyn Jackson MRN: [id number] DOB: 06-23-1946 Today's Date: 06/23/2017    History of Present Illness 71 yo with PMHx: Chronic A.Fib on coumadin, HTN, HLD, breast CA s/p excision on radiation.  Patient presents to the ED with L sided "sciatic nerve pain" and positive UTI. Pt with steroid injection in back 8/11    PT Comments    Today's skilled session focused on stair negotiation to increase pt's safety for d/c home today. Pt negotiated 1 fight of steps with min guard for safety and 2 standing rest breaks. Pt reported supine piriforms stretch effective in reducing sciatic pain. Patient would benefit from continued skilled PT to increase functional independence. HHPT to follow up after d/c home.   Follow Up Recommendations  Home health PT     Equipment Recommendations  Rolling walker with 5" wheels    Recommendations for Other Services       Precautions / Restrictions Precautions Precautions: Fall Restrictions Weight Bearing Restrictions: No    Mobility  Bed Mobility               General bed mobility comments: Pt in chair on arrival. Reports she is too uncomfortable in bed and slept in chair.  Transfers Overall transfer level: Needs assistance Equipment used: Rolling walker (2 wheeled) Transfers: Sit to/from Stand Sit to Stand: Min guard         General transfer comment: Curing for hand placement. (x2)  Ambulation/Gait Ambulation/Gait assistance: Supervision Ambulation Distance (Feet): 20 Feet Assistive device: Rolling walker (2 wheeled) Gait Pattern/deviations: Step-through pattern;Decreased stride length;Trunk flexed Gait velocity: decreased Gait velocity interpretation: Below normal speed for age/gender General Gait Details: Pt ambulated to and from door of stairwell. Too fatigued after negotiating steps to ambulate farther.   Stairs Stairs: Yes   Stair Management: Step to pattern;Forwards;One rail  Right Number of Stairs: 16 General stair comments: Pt negotiated 1 flight of steps pausing twice while descending for standing rest breaks. Cueing for sequencing. Min guard for safety.  Wheelchair Mobility    Modified Rankin (Stroke Patients Only)       Balance Overall balance assessment: Needs assistance Sitting-balance support: No upper extremity supported Sitting balance-Leahy Scale: Good     Standing balance support: Bilateral upper extremity supported Standing balance-Leahy Scale: Fair Standing balance comment: reliant on RW for support                            Cognition Arousal/Alertness: Awake/alert Behavior During Therapy: WFL for tasks assessed/performed Overall Cognitive Status: Within Functional Limits for tasks assessed                                        Exercises General Exercises - Lower Extremity Long Arc Quad: AROM;Both;5 reps;Seated Other Exercises Other Exercises: Supine piriformis stretch; Left; 2x 20 seconds; To decrease pain prior to ambulation    General Comments        Pertinent Vitals/Pain Pain Assessment: 0-10 Pain Score: 6  Pain Location: low back radiating down the R LE Pain Descriptors / Indicators: Sore;Radiating;Burning Pain Intervention(s): Monitored during session;Limited activity within patient's tolerance;Repositioned    Home Living                      Prior Function            PT  Goals (current goals can now be found in the care plan section) Acute Rehab PT Goals Patient Stated Goal: return home PT Goal Formulation: With patient Time For Goal Achievement: 06/27/17 Potential to Achieve Goals: Good Progress towards PT goals: Progressing toward goals    Frequency    Min 3X/week      PT Plan Current plan remains appropriate    Co-evaluation              AM-PAC PT "6 Clicks" Daily Activity  Outcome Measure  Difficulty turning over in bed (including adjusting  bedclothes, sheets and blankets)?: A Little Difficulty moving from lying on back to sitting on the side of the bed? : A Little Difficulty sitting down on and standing up from a chair with arms (e.g., wheelchair, bedside commode, etc,.)?: A Little Help needed moving to and from a bed to chair (including a wheelchair)?: A Little Help needed walking in hospital room?: A Little Help needed climbing 3-5 steps with a railing? : A Lot 6 Click Score: 17    End of Session Equipment Utilized During Treatment: Gait belt Activity Tolerance: Patient tolerated treatment well;Patient limited by pain Patient left: in chair;with call bell/phone within reach Nurse Communication: Mobility status PT Visit Diagnosis: Other abnormalities of gait and mobility (R26.89);Pain;Difficulty in walking, not elsewhere classified (R26.2) Pain - Right/Left: Left Pain - part of body: Leg     Time: 1224-8250 PT Time Calculation (min) (ACUTE ONLY): 20 min  Charges:  $Gait Training: 8-22 mins                    G Codes:      Benjiman Core, [place] Pager [phone] Acute Rehab  Allena Katz 06/23/2017, 2:03 PM

## 2017-06-23 NOTE — Progress Notes (Signed)
Informed patient of the recent discharge orders received and patient informed this RN that she is still waiting for her son to pick her up from work around 2215.  Informed Network engineer about this information.

## 2017-06-23 NOTE — Progress Notes (Signed)
CRITICAL VALUE ALERT  Critical Value:  INR >10.0  Date & Time Notied:  06/23/17 @ 9340  Provider Notified: Hal Hope @ [phone]  Orders Received/Actions taken: No orders received at this time. Awaiting return call. Asked lab to redraw labs.

## 2017-06-23 NOTE — Progress Notes (Signed)
Patient discharged to home accompanied by son and wheeled by CNA downstairs.  Discharged instructions explained to patient and son and questions answered appropriately particularly appointments and medications to be stopped and continue.  Son was assured that there is a [facility] pharmacy in [place] open 24 hrs for him to get the medication prescriptions.  Discharge instructions given including prescriptions.

## 2017-06-23 NOTE — Discharge Summary (Signed)
Physician Discharge Summary  Katelyn Jackson XNA:[id number] DOB: [birth year] DOA: 06/18/2017  PCP: Sandi Mariscal, MD  Admit date: 06/18/2017 Discharge date: 06/23/2017  Time spent:20 minutes  Recommendations for Outpatient Follow-up:  1. Ensure patient follows up with nephrology. Continue monitoring serum creatinine 2. Ensure patient follows up for steroid injection for sciatica 3. Continue Coumadin after steroid injection 4. Discontinue by mouth steroids after patient receives steroid injection of back   Discharge Diagnoses:  Principal Problem:   Intractable low back pain Active Problems:   Essential hypertension   Chronic atrial fibrillation (HCC)   AKI (acute kidney injury) ([place])   Hypokalemia   Acute lower UTI   Discharge Condition: Stable  Diet recommendation: Heart healthy  Filed Weights   06/19/17 0500  Weight: 125.6 kg (277 lb)    History of present illness:  Katelyn Jackson a 71 y.o.femalewith medical history significant of Chronic A.Fib on coumadin, HTN, BRCA s/p excision on radiation. Patient presents to the ED with L sided "sciatic nerve pain" that has been ongoing for past 3 days. Pain radiates to L leg. History of same and was seen in ED on July 23rd and felt better after course of steroids.  Symptoms returned and saw PCP, given a IM steroid injection. Day of admission had severe pain and vomited more than 20 times. Found to have AKI that worsened during stay from lasix/dehydration and an UTI. An epidural was attempted to help with lower back pain but this was unable to be arranged in the hospital. This was be done at the spine center after discharge.  Coumadin on hold for this.  Hospital Course:  Intractable low back pain - sciatica Attempted to arrange steroid injection but unable to get done in hospital- will have to do outpatient --- will start PO steroids since not to inject in house      -MRI showed: Mild foraminal stenosis from L3-4 through L5-S1  with moderate to severe left L5-S1 foraminal    stenosis.  AKI on CKD stage 3 - - IVF- still elevated but appears to have slowed down -appears to be pre-renal -renal U/S shows small kidneys -Patient is to follow with nephrology as outpatient  Hypokalemia - replaced K  HTN -             -hold ARB, continue diuretic  Chronic A.Fib - Stop coumadin in anticipation of procedure  UTI - Rocephin, transitioned to oral cephalosporin Culture pending- ecoli + klebsiella  Breast cancer             -unable to do scheduled radiation as unable to lay on back  Constipation             -PO medications             -ambulation encouraged  Chronic lymphedema             -encouraged patient to elevate LE extremities  Procedures:  None  Consultations:  Nephrology  Discharge Exam: Vitals:   06/23/17 0541 06/23/17 1300  BP: 129/85 138/75  Pulse: 88 96  Resp: 18 17  Temp: 98.1 F (36.7 C) 97.6 F (36.4 C)  SpO2: 99% 99%    General: Pt in nad, alert and awake Cardiovascular: rrr, no rubs Respiratory: no increased wob, no wheezes  Discharge Instructions   Discharge Instructions    Call MD for:  temperature >100.4    Complete by:  As directed    Diet - low sodium heart healthy    Complete by:  As directed    Discharge instructions    Complete by:  As directed    Please follow-up with nephrology and physician who performed a steroid injection of her back for relief of pain.   Increase activity slowly    Complete by:  As directed      Current Discharge Medication List    START taking these medications   Details  cephALEXin (KEFLEX) 250 MG capsule Take 1 capsule (250 mg total) by mouth 2 (two) times daily. Qty: 6 capsule, Refills: 0    HYDROcodone-acetaminophen (NORCO/VICODIN) 5-325 MG tablet Take 1 tablet by mouth every 4 (four) hours as needed for moderate pain (can take 2 before bed). Qty: 30 tablet, Refills: 0    methocarbamol (ROBAXIN) 500 MG tablet Take 1  tablet (500 mg total) by mouth 4 (four) times daily. Qty: 15 tablet, Refills: 0      CONTINUE these medications which have NOT CHANGED   Details  febuxostat (ULORIC) 40 MG tablet Take 40 mg by mouth daily.    levothyroxine (SYNTHROID, LEVOTHROID) 50 MCG tablet Take 50 mcg by mouth daily before breakfast.     lipase/protease/amylase (CREON) 12000 units CPEP capsule Take 12,000 Units by mouth daily as needed (stomach problems).     lovastatin (MEVACOR) 20 MG tablet Take 20 mg by mouth daily.     omeprazole (PRILOSEC) 40 MG capsule Take 40 mg by mouth daily as needed (acid reflux).     polyethylene glycol powder (GLYCOLAX/MIRALAX) powder Take 17 g by mouth at bedtime.       STOP taking these medications     furosemide (LASIX) 40 MG tablet      KLOR-CON M20 20 MEQ tablet      valsartan-hydrochlorothiazide (DIOVAN-HCT) 320-25 MG tablet      warfarin (COUMADIN) 5 MG tablet        Allergies  Allergen Reactions  . Chlorhexidine Gluconate Hives  . Penicillins Itching    Has patient had a PCN reaction causing immediate rash, facial/tongue/throat swelling, SOB or lightheadedness with hypotension: no Has patient had a PCN reaction causing severe rash involving mucus membranes or skin necrosis: No Has patient had a PCN reaction that required hospitalization: No Has patient had a PCN reaction occurring within the last 10 years: No If all of the above answers are "NO", then may proceed with Cephalosporin use.      The results of significant diagnostics from this hospitalization (including imaging, microbiology, ancillary and laboratory) are listed below for reference.    Significant Diagnostic Studies: Mr Lumbar Spine Wo Contrast  Result Date: 06/19/2017 CLINICAL DATA:  71 y/o F; sciatic nerve pain radiating to the left lower extremity. EXAM: MRI LUMBAR SPINE WITHOUT CONTRAST TECHNIQUE: Multiplanar, multisequence MR imaging of the lumbar spine was performed. No intravenous contrast  was administered. COMPARISON:  None. FINDINGS: Moderate motion degradation. Segmentation:  Standard. Alignment:  Straightening of lumbar lordosis.  No listhesis. Vertebrae: Mild endplate degenerative changes at the L3-4 and L4-5 levels. No evidence of discitis or acute fracture. Conus medullaris: Extends to the L2 level and appears normal. Paraspinal and other soft tissues: 9 mm right kidney interpolar T2 hyperintense well-circumscribed structure compatible with a cyst. Disc levels: L1-2: No significant disc displacement, foraminal narrowing, or canal stenosis. L2-3: Minimal disc bulge and mild facet hypertrophy. No significant foraminal or canal stenosis. Trace facet effusions. L3-4: Moderate disc bulge, facet, and ligamentum flavum hypertrophy. Mild bilateral foraminal stenosis and moderate canal stenosis. L4-5: Small disc bulge with facet and  ligamentum flavum hypertrophy. Mild bilateral foraminal and canal stenosis. L5-S1: Small disc bulge with endplate marginal osteophytes eccentric to the left foraminal and extraforaminal zone as well as left-greater-than-right facet hypertrophy. Mild right and moderate to severe left foraminal stenosis. No significant canal stenosis. IMPRESSION: 1. No acute fracture or malalignment. 2. Multifactorial moderate L3-4 and mild L4-5 canal stenosis. 3. Mild foraminal stenosis from L3-4 through L5-S1 with moderate to severe left L5-S1 foraminal stenosis. Electronically Signed   By: Kristine Garbe M.D.   On: 06/19/2017 03:56   US Renal  Result Date: 06/21/2017 CLINICAL DATA:  Acute kidney injury EXAM: RENAL / URINARY TRACT ULTRASOUND COMPLETE COMPARISON:  None. FINDINGS: Right Kidney: Length: 7.8 cm. Echogenicity within normal limits. No mass or hydronephrosis visualized. Left Kidney: Length: 8.5 cm. Echogenicity within normal limits. No mass or hydronephrosis visualized. Bladder: Bladder is empty. IMPRESSION: Difficult visualization of kidneys due to habitus and  difficulty with breath holding. Negative for hydronephrosis. Kidneys are somewhat small in appearance suggesting mild atrophy. Electronically Signed   By: Donavan Foil M.D.   On: 06/21/2017 00:10   Dg Chest Port 1 View  Result Date: 05/29/2017 CLINICAL DATA:  Acute onset of chest pain associated with nausea. Current history of left breast cancer, hypertension, to chronic atrial fibrillation and pulmonary hypertension. EXAM: PORTABLE CHEST 1 VIEW COMPARISON:  None. FINDINGS: Cardiac silhouette markedly enlarged. Thoracic aorta mildly tortuous atherosclerotic. Prominent central pulmonary arteries. Lungs clear. Bronchovascular markings normal. Pulmonary vascularity normal. No visible pleural effusions. No pneumothorax. Left axillary node dissection. IMPRESSION: 1. Marked cardiomegaly.  No acute cardiopulmonary disease. 2. Thoracic aortic atherosclerosis. Electronically Signed   By: Evangeline Dakin M.D.   On: 05/29/2017 13:07    Microbiology: Recent Results (from the past 240 hour(s))  Urine Culture     Status: Abnormal   Collection Time: 06/19/17  1:23 AM  Result Value Ref Range Status   Specimen Description URINE, RANDOM  Final   Special Requests CX ADDED AT 0214 ON [id number]  Final   Culture (A)  Final    >=100,000 COLONIES/mL KLEBSIELLA PNEUMONIAE >=100,000 COLONIES/mL ESCHERICHIA COLI    Report Status 06/21/2017 FINAL  Final   Organism ID, Bacteria KLEBSIELLA PNEUMONIAE (A)  Final   Organism ID, Bacteria ESCHERICHIA COLI (A)  Final      Susceptibility   Escherichia coli - MIC*    AMPICILLIN >=32 RESISTANT Resistant     CEFAZOLIN <=4 SENSITIVE Sensitive     CEFTRIAXONE <=1 SENSITIVE Sensitive     CIPROFLOXACIN <=0.25 SENSITIVE Sensitive     GENTAMICIN <=1 SENSITIVE Sensitive     IMIPENEM <=0.25 SENSITIVE Sensitive     NITROFURANTOIN <=16 SENSITIVE Sensitive     TRIMETH/SULFA <=20 SENSITIVE Sensitive     AMPICILLIN/SULBACTAM 16 INTERMEDIATE Intermediate     PIP/TAZO <=4 SENSITIVE  Sensitive     Extended ESBL NEGATIVE Sensitive     * >=100,000 COLONIES/mL ESCHERICHIA COLI   Klebsiella pneumoniae - MIC*    AMPICILLIN >=32 RESISTANT Resistant     CEFAZOLIN <=4 SENSITIVE Sensitive     CEFTRIAXONE <=1 SENSITIVE Sensitive     CIPROFLOXACIN <=0.25 SENSITIVE Sensitive     GENTAMICIN <=1 SENSITIVE Sensitive     IMIPENEM <=0.25 SENSITIVE Sensitive     NITROFURANTOIN 32 SENSITIVE Sensitive     TRIMETH/SULFA <=20 SENSITIVE Sensitive     AMPICILLIN/SULBACTAM 4 SENSITIVE Sensitive     PIP/TAZO <=4 SENSITIVE Sensitive     Extended ESBL NEGATIVE Sensitive     * >=100,000 COLONIES/mL KLEBSIELLA  PNEUMONIAE     Labs: Basic Metabolic Panel:  Recent Labs Lab 06/20/17 1355 06/21/17 0255 06/22/17 0559 06/23/17 0436 06/23/17 0750  NA 131* 132* 131* 131* 132*  K 4.6 4.4 4.2 4.4 4.3  CL 99* 97* 100* 101 100*  CO2 19* 22 21* 20* 21*  GLUCOSE 164* 118* 127* 109* 98  BUN 62* 66* 62* 57* 56*  CREATININE 3.25* 3.31* 2.50* 2.25* 2.32*  CALCIUM 9.3 9.5 9.5 9.5 9.5  PHOS  --   --   --  3.7 3.6   Liver Function Tests:  Recent Labs Lab 06/19/17 0102 06/23/17 0436 06/23/17 0750  AST 24  --   --   ALT 25  --   --   ALKPHOS 64  --   --   BILITOT 1.1  --   --   PROT 7.1  --   --   ALBUMIN 3.4* 3.3* 3.2*    Recent Labs Lab 06/19/17 0102  LIPASE 29   No results for input(s): AMMONIA in the last 168 hours. CBC:  Recent Labs Lab 06/19/17 0102 06/20/17 0540 06/22/17 0559  WBC 7.1 8.2 7.9  NEUTROABS 5.1  --   --   HGB 11.6* 11.8* 11.2*  HCT 35.3* 37.1 35.6*  MCV 87.4 88.1 89.0  PLT 218 224 203   Cardiac Enzymes:  Recent Labs Lab 06/19/17 0102 06/19/17 0257  TROPONINI 0.03* 0.04*   BNP: BNP (last 3 results) No results for input(s): BNP in the last 8760 hours.  ProBNP (last 3 results) No results for input(s): PROBNP in the last 8760 hours.  CBG: No results for input(s): GLUCAP in the last 168 hours.     Signed:  Velvet Bathe MD.  [facility] 06/23/2017, 7:08 PM

## 2017-06-23 NOTE — Progress Notes (Signed)
[facility] Rounding Note  Brief HPI: 71 y/o F with CKD3 (BL Cr 1.6-1.8), AFib, HTN (On Valsartan-HCTZ outpatient) and chronic lymphedema (On lasix at home) admitted 8/13 with intractable back pain. Endorses 20 episodes of emesis prior to admission and UA suggested UTI. Was found to have worsening renal function (2 > 3.3) and nephrology was consulted. Felt to be pre-renal in nature, offending agents held and she is getting IVF.   Subjective/Interval events:  INR >10 this morning despite holding warfarin since admission, lab error as repeat value 1.5.  Cr 2.3 today, was 2.25 yest.  No recorded UOP however pt producing good amount per her report.  Complains of BL leg swelling.  Inpatient medications: . febuxostat  40 mg Oral Daily  . HYDROcodone-acetaminophen  1 tablet Oral Q4H while awake  . levothyroxine  50 mcg Oral QAC breakfast  . lidocaine  1 patch Transdermal Q24H  . methocarbamol  500 mg Oral QID  . pantoprazole  80 mg Oral Daily  . polyethylene glycol  17 g Oral QHS  . pravastatin  20 mg Oral q1800  . predniSONE  50 mg Oral Q breakfast  . senna-docusate  1 tablet Oral BID    Physical Exam:  Blood pressure 129/85, pulse 88, temperature 98.1 F (36.7 C), temperature source Oral, resp. rate 18, height 5\' 2"  (1.575 m), weight 277 lb (125.6 kg), SpO2 99 %.  Gen: Obese caucasian woman resting comfortably in bedside chair. Pleasant and conversant, though not entirely fluent in [place].  Lines/tubes: Right PIV 8/13 Chest: IRIR but rate controlled. No murmur appreciated.  Pulmonary: CTA BL with normal WOB on RA. Able to speak in complete sentences.  Abdomen: soft, non tender and not distended. +bs Ext: lymphedema all 4 extremities. Not pitting. No cyanosis.  Dialysis Access: None.  Filed Weights   06/19/17 0500  Weight: 277 lb (125.6 kg)    Intake/Output Summary (Last 24 hours) at 06/23/17 0848 Last data filed at 06/23/17 0543  Gross per 24 hour  Intake              400 ml  Output                 0 ml  Net              400 ml   Labs: Renal Panel:  Recent Labs Lab 06/19/17 0938 06/20/17 0540 06/20/17 1355 06/21/17 0255 06/22/17 0559 06/23/17 0436 06/23/17 0750  NA 135 135 131* 132* 131* 131* 132*  K 3.6 4.3 4.6 4.4 4.2 4.4 4.3  CL 97* 101 99* 97* 100* 101 100*  CO2 26 24 19* 22 21* 20* 21*  GLUCOSE 125* 141* 164* 118* 127* 109* 98  BUN 41* 54* 62* 66* 62* 57* 56*  CREATININE 2.18* 2.91* 3.25* 3.31* 2.50* 2.25* 2.32*  CALCIUM 9.3 9.7 9.3 9.5 9.5 9.5 9.5  PHOS  --   --   --   --   --  3.7 3.6   Liver Function Tests:  Recent Labs Lab 06/19/17 0102 06/23/17 0436 06/23/17 0750  AST 24  --   --   ALT 25  --   --   ALKPHOS 64  --   --   BILITOT 1.1  --   --   PROT 7.1  --   --   ALBUMIN 3.4* 3.3* 3.2*    Recent Labs Lab 06/19/17 0102  LIPASE 29   CBC:  Recent Labs Lab 06/19/17 0102 06/20/17 0540 06/22/17 0559  WBC 7.1 8.2 7.9  NEUTROABS 5.1  --   --   HGB 11.6* 11.8* 11.2*  HCT 35.3* 37.1 35.6*  MCV 87.4 88.1 89.0  PLT 218 224 203    Recent Labs Lab 06/19/17 0102 06/19/17 0257  TROPONINI 0.03* 0.04*   Xrays/Other Studies: No results found.  Background: 71 y/o F with CKD-3 (Bl Cr 1.6), HTN, AFib and chronic pain admitted 8/13 with intractable back pain found to have UTI and Acute on Chronic Renal Failure following multiple episodes of emesis. She also has hx of DM2, HTN. Follows with Dr. Florene Glen as an outpatient.   Assessment/Recommendations  1. Acute on Chronic Renal Failure, Pre-Renal Azotemia: Bl Cr 1.-1.8 (most recently 05/2017), 2.01 > 3.3 during admission however now 2.3 after IVF replacement & holding nephrotoxic meds. Renal US with mildly atrophic kidneys. On Valsartan-HCTZ and Lasix 40mg  BID at home for HTN and chronic lymphedema. Does endorse about 20 episodes of emesis prior to admission. BUN:CR ratio 20, suggesting Pre-renal etiology. In addition, FENa obtained 0.2% also suggesting this.  1. No recorded UOP but urinating  well 2. Cr stable from  yesterday 3. Continue to hold IVF 2. Hyponatremia, mild: Normal on admission however mild at 132 right now. Urine sodium <10 (appropriate renal response) and FENa 0.2% suggesting a pre-renal etiology. Given her hx, she is likely volume deplete from continued emesis.  1. Emesis resolved 2. Volume replacement PO 3. Monitor renal function 4. May need to add lasix if sodium drops however should respond to IVF's given her low FeNa. 3. HypoK: 2.9 on admission, resolved with PO replacement. Likely related to volume loss from emesis 1. Monitor renal function, replace as indicated 4. UTI: UA suggesting UTI however she denies dysuria or pelvic pain. Back pain could be in part due to acute UTI. Ucx so far with >100,000 Klebsiella and E.coli, both sensitive to Ceftriaxone.  1. Continue Ceftriaxone 2. Will need kidney-friendly agent on dc 5. HTN: Normotensive during admission. She appears to be on Valsartan-HCTZ 320mg -25mg  daily as an outpatient which was held on admission.  1. BP currently controlled with PRN hydral. Has not required any so far. 6. LBP: Current regimen Norco 5-325mg  Q4H. Continue to avoid nephrotoxic agents 7. Normocytic Anemia: at 11.2, Hb appears to be her baseline. Monitor, transfuse PRN.   Renal function stable at this point. She could be discharged today/over the weekend but will need close outpatient follow-up with Nephrology. VALSARTAN MUST BE HELD ON DISCHARGE. She may resume lasix at discharge.   Einar Gip, DO Internal [facility] 06/23/2017, 8:48 AM   I have seen and examined this patient and agree with plan and assessment in the above note with renal recommendations/intervention highlighted.  She has evidence of AKI/CKD with volume depletion/ischemic ATN as cause which has now resolved after IVF"s.  She will need to establish nephrology follow up after discharge.  Continue to hold Valsartan but ok to resume lasix as an  outpatient.  Our office will call for follow up. Broadus John A Tashana Haberl,MD 06/23/2017 1:53 PM

## 2017-06-26 ENCOUNTER — Ambulatory Visit: Payer: Medicare HMO

## 2017-06-26 ENCOUNTER — Telehealth: Payer: Self-pay | Admitting: *Deleted

## 2017-06-26 ENCOUNTER — Encounter (HOSPITAL_COMMUNITY): Payer: Self-pay | Admitting: *Deleted

## 2017-06-26 NOTE — Telephone Encounter (Signed)
Called son [name] and asked how his mother was doing since d/c from the hospital and if she could come in to restart her breast radiation,as she has missed many treatments,"No she cannot even walk to the bathroom, she is in so much pain, ", her steroid injection will be either this Wednesday or Thursday and after that I will call and let you know when she can resume her treatments", I will let MD and our Pa Bryson Ha know , thanked him for answering my call 10:30 AM

## 2017-06-27 ENCOUNTER — Ambulatory Visit: Payer: Medicare HMO | Admitting: Radiation Oncology

## 2017-06-27 ENCOUNTER — Ambulatory Visit: Payer: Medicare HMO

## 2017-06-28 ENCOUNTER — Ambulatory Visit: Payer: Medicare HMO

## 2017-06-29 ENCOUNTER — Ambulatory Visit: Payer: Medicare HMO

## 2017-06-30 ENCOUNTER — Ambulatory Visit: Payer: Medicare HMO | Admitting: Hematology

## 2017-06-30 ENCOUNTER — Ambulatory Visit: Payer: Medicare HMO

## 2017-06-30 ENCOUNTER — Telehealth: Payer: Self-pay | Admitting: Hematology

## 2017-06-30 NOTE — Telephone Encounter (Signed)
Patient son called and said she is not feeling well and needs to reschedule... She is now scheduled for 9/6 at 8:30

## 2017-07-03 ENCOUNTER — Ambulatory Visit: Payer: Medicare HMO

## 2017-07-03 ENCOUNTER — Telehealth: Payer: Self-pay | Admitting: *Deleted

## 2017-07-03 NOTE — Telephone Encounter (Signed)
Called and spoke with [name], patient's son, he stated "not coming in today waiting on Physical therapist at 2pm today and will ask them if his mother will be able to start radiationthis eek, he wil call us today by 4pm and let us know"asked if the patient was getting any better ,"yes some. thank you for calling"p[er son [name] Will inform Bryson Ha and the  therapist 11:13 AM

## 2017-07-04 ENCOUNTER — Ambulatory Visit: Payer: Medicare HMO

## 2017-07-04 ENCOUNTER — Ambulatory Visit
Admission: RE | Admit: 2017-07-04 | Discharge: 2017-07-04 | Disposition: A | Discharge status: Home / Self Care | Payer: Medicare HMO | Source: Ambulatory Visit | Attending: Radiation Oncology | Admitting: Radiation Oncology

## 2017-07-04 DIAGNOSIS — Z9889 Other specified postprocedural states: Secondary | ICD-10-CM | POA: Diagnosis not present

## 2017-07-04 DIAGNOSIS — N611 Abscess of the breast and nipple: Secondary | ICD-10-CM | POA: Diagnosis not present

## 2017-07-04 DIAGNOSIS — Z7901 Long term (current) use of anticoagulants: Secondary | ICD-10-CM | POA: Diagnosis not present

## 2017-07-04 DIAGNOSIS — Z8249 Family history of ischemic heart disease and other diseases of the circulatory system: Secondary | ICD-10-CM | POA: Diagnosis not present

## 2017-07-04 DIAGNOSIS — I482 Chronic atrial fibrillation: Secondary | ICD-10-CM | POA: Diagnosis not present

## 2017-07-04 DIAGNOSIS — K219 Gastro-esophageal reflux disease without esophagitis: Secondary | ICD-10-CM | POA: Diagnosis not present

## 2017-07-04 DIAGNOSIS — N189 Chronic kidney disease, unspecified: Secondary | ICD-10-CM | POA: Diagnosis not present

## 2017-07-04 DIAGNOSIS — C50412 Malignant neoplasm of upper-outer quadrant of left female breast: Secondary | ICD-10-CM | POA: Diagnosis not present

## 2017-07-04 DIAGNOSIS — M1990 Unspecified osteoarthritis, unspecified site: Secondary | ICD-10-CM | POA: Diagnosis not present

## 2017-07-04 DIAGNOSIS — I129 Hypertensive chronic kidney disease with stage 1 through stage 4 chronic kidney disease, or unspecified chronic kidney disease: Secondary | ICD-10-CM | POA: Diagnosis not present

## 2017-07-04 DIAGNOSIS — E669 Obesity, unspecified: Secondary | ICD-10-CM | POA: Diagnosis not present

## 2017-07-04 DIAGNOSIS — Z510 Encounter for antineoplastic radiation therapy: Secondary | ICD-10-CM | POA: Diagnosis present

## 2017-07-04 DIAGNOSIS — Z79899 Other long term (current) drug therapy: Secondary | ICD-10-CM | POA: Diagnosis not present

## 2017-07-04 DIAGNOSIS — I10 Essential (primary) hypertension: Secondary | ICD-10-CM | POA: Diagnosis not present

## 2017-07-04 DIAGNOSIS — I272 Pulmonary hypertension, unspecified: Secondary | ICD-10-CM | POA: Diagnosis not present

## 2017-07-04 DIAGNOSIS — I890 Lymphedema, not elsewhere classified: Secondary | ICD-10-CM | POA: Diagnosis not present

## 2017-07-04 DIAGNOSIS — Z79891 Long term (current) use of opiate analgesic: Secondary | ICD-10-CM | POA: Diagnosis not present

## 2017-07-04 DIAGNOSIS — Z170 Estrogen receptor positive status [ER+]: Secondary | ICD-10-CM | POA: Diagnosis not present

## 2017-07-04 NOTE — Telephone Encounter (Signed)
Patient home called,spoke with son [name], "yes she will come today for radiation", infomred Linac #3, and will let MD and our PA Alison  9:03 AM

## 2017-07-05 ENCOUNTER — Ambulatory Visit
Admission: RE | Admit: 2017-07-05 | Discharge: 2017-07-05 | Disposition: A | Discharge status: Home / Self Care | Payer: Medicare HMO | Source: Ambulatory Visit | Attending: Radiation Oncology | Admitting: Radiation Oncology

## 2017-07-05 ENCOUNTER — Ambulatory Visit: Payer: Medicare HMO

## 2017-07-05 DIAGNOSIS — Z510 Encounter for antineoplastic radiation therapy: Secondary | ICD-10-CM | POA: Diagnosis not present

## 2017-07-06 ENCOUNTER — Ambulatory Visit
Admission: RE | Admit: 2017-07-06 | Discharge: 2017-07-06 | Disposition: A | Discharge status: Home / Self Care | Payer: Medicare HMO | Source: Ambulatory Visit | Attending: Radiation Oncology | Admitting: Radiation Oncology

## 2017-07-06 ENCOUNTER — Ambulatory Visit: Payer: Medicare HMO

## 2017-07-06 DIAGNOSIS — Z510 Encounter for antineoplastic radiation therapy: Secondary | ICD-10-CM | POA: Diagnosis not present

## 2017-07-07 ENCOUNTER — Ambulatory Visit: Payer: Medicare HMO

## 2017-07-07 ENCOUNTER — Ambulatory Visit
Admission: RE | Admit: 2017-07-07 | Discharge: 2017-07-07 | Disposition: A | Discharge status: Home / Self Care | Payer: Medicare HMO | Source: Ambulatory Visit | Attending: Radiation Oncology | Admitting: Radiation Oncology

## 2017-07-07 DIAGNOSIS — Z510 Encounter for antineoplastic radiation therapy: Secondary | ICD-10-CM | POA: Diagnosis not present

## 2017-07-07 DIAGNOSIS — C50412 Malignant neoplasm of upper-outer quadrant of left female breast: Secondary | ICD-10-CM

## 2017-07-07 DIAGNOSIS — Z170 Estrogen receptor positive status [ER+]: Principal | ICD-10-CM

## 2017-07-07 MED ORDER — RADIAPLEXRX EX GEL
Freq: Once | CUTANEOUS | Status: AC
  Administered 2017-07-07: 11:00:00 via TOPICAL

## 2017-07-10 ENCOUNTER — Ambulatory Visit: Payer: Medicare HMO

## 2017-07-11 ENCOUNTER — Ambulatory Visit (INDEPENDENT_AMBULATORY_CARE_PROVIDER_SITE_OTHER): Payer: Medicare HMO | Admitting: Internal Medicine

## 2017-07-11 ENCOUNTER — Ambulatory Visit: Payer: Medicare HMO

## 2017-07-11 ENCOUNTER — Other Ambulatory Visit (INDEPENDENT_AMBULATORY_CARE_PROVIDER_SITE_OTHER): Payer: Medicare HMO

## 2017-07-11 ENCOUNTER — Ambulatory Visit
Admission: RE | Admit: 2017-07-11 | Discharge: 2017-07-11 | Disposition: A | Discharge status: Home / Self Care | Payer: Medicare HMO | Source: Ambulatory Visit | Attending: Radiation Oncology | Admitting: Radiation Oncology

## 2017-07-11 ENCOUNTER — Other Ambulatory Visit: Payer: Self-pay | Admitting: Internal Medicine

## 2017-07-11 ENCOUNTER — Encounter: Payer: Self-pay | Admitting: Internal Medicine

## 2017-07-11 VITALS — BP 138/76 | HR 85 | Temp 98.8°F | Ht 62.0 in | Wt 260.0 lb

## 2017-07-11 DIAGNOSIS — Z23 Encounter for immunization: Secondary | ICD-10-CM

## 2017-07-11 DIAGNOSIS — I482 Chronic atrial fibrillation, unspecified: Secondary | ICD-10-CM

## 2017-07-11 DIAGNOSIS — E66812 Obesity, class 2: Secondary | ICD-10-CM | POA: Insufficient documentation

## 2017-07-11 DIAGNOSIS — C50412 Malignant neoplasm of upper-outer quadrant of left female breast: Secondary | ICD-10-CM

## 2017-07-11 DIAGNOSIS — I10 Essential (primary) hypertension: Secondary | ICD-10-CM

## 2017-07-11 DIAGNOSIS — N179 Acute kidney failure, unspecified: Secondary | ICD-10-CM

## 2017-07-11 DIAGNOSIS — E785 Hyperlipidemia, unspecified: Secondary | ICD-10-CM | POA: Diagnosis not present

## 2017-07-11 DIAGNOSIS — R739 Hyperglycemia, unspecified: Secondary | ICD-10-CM | POA: Insufficient documentation

## 2017-07-11 DIAGNOSIS — I272 Pulmonary hypertension, unspecified: Secondary | ICD-10-CM | POA: Diagnosis not present

## 2017-07-11 DIAGNOSIS — Z510 Encounter for antineoplastic radiation therapy: Secondary | ICD-10-CM | POA: Diagnosis not present

## 2017-07-11 DIAGNOSIS — Z170 Estrogen receptor positive status [ER+]: Secondary | ICD-10-CM

## 2017-07-11 LAB — BASIC METABOLIC PANEL
BUN: 23 mg/dL (ref 6–23)
CALCIUM: 9.4 mg/dL (ref 8.4–10.5)
CO2: 28 meq/L (ref 19–32)
Chloride: 100 mEq/L (ref 96–112)
Creatinine, Ser: 1.43 mg/dL — ABNORMAL HIGH (ref 0.40–1.20)
GFR: 38.38 mL/min — AB (ref >60.00)
GLUCOSE: 155 mg/dL — AB (ref 70–99)
Potassium: 3.5 mEq/L (ref 3.5–5.1)
SODIUM: 139 meq/L (ref 135–145)

## 2017-07-11 LAB — URINALYSIS, ROUTINE W REFLEX MICROSCOPIC
BILIRUBIN URINE: NEGATIVE
HGB URINE DIPSTICK: NEGATIVE
KETONES UR: NEGATIVE
NITRITE: NEGATIVE
PH: 6 (ref 5.0–8.0)
RBC / HPF: NONE SEEN (ref 0–?)
Specific Gravity, Urine: 1.01 (ref 1.000–1.030)
TOTAL PROTEIN, URINE-UPE24: NEGATIVE
URINE GLUCOSE: NEGATIVE
UROBILINOGEN UA: 0.2 (ref 0.0–1.0)

## 2017-07-11 LAB — HEPATIC FUNCTION PANEL
ALT: 20 U/L (ref 0–35)
AST: 18 U/L (ref 0–37)
Albumin: 3.7 g/dL (ref 3.5–5.2)
Alkaline Phosphatase: 78 U/L (ref 39–117)
BILIRUBIN DIRECT: 0.1 mg/dL (ref 0.0–0.3)
Total Bilirubin: 0.5 mg/dL (ref 0.2–1.2)
Total Protein: 6.6 g/dL (ref 6.0–8.3)

## 2017-07-11 LAB — TSH: TSH: 2.09 u[IU]/mL (ref 0.35–4.50)

## 2017-07-11 LAB — HEMOGLOBIN A1C: Hgb A1c MFr Bld: 5.7 % (ref 4.6–6.5)

## 2017-07-11 MED ORDER — CARVEDILOL 6.25 MG PO TABS: 6.2500 mg | ORAL_TABLET | Freq: Two times a day (BID) | ORAL | 11 refills | Status: DC

## 2017-07-11 MED ORDER — VITAMIN D3 50 MCG (2000 UT) PO CAPS: 2000.0000 [IU] | ORAL_CAPSULE | Freq: Every day | ORAL | 3 refills | Status: DC

## 2017-07-11 NOTE — Patient Instructions (Signed)
Coumadin Clinic in 1 month

## 2017-07-11 NOTE — Addendum Note (Signed)
Addended by: Karren Cobble on: 07/11/2017 04:28 PM   Modules accepted: Orders

## 2017-07-11 NOTE — Assessment & Plan Note (Signed)
On Coumadin 

## 2017-07-11 NOTE — Assessment & Plan Note (Signed)
BMI 47 Diet discussed

## 2017-07-11 NOTE — Assessment & Plan Note (Signed)
Wt loss  

## 2017-07-11 NOTE — Assessment & Plan Note (Signed)
D/c Valsartan Irbesartan

## 2017-07-11 NOTE — Progress Notes (Signed)
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 calcium and vitamin D. -[facility] in [facility] next month.   PLAN - DEXA scan in 1 month at [place] and f/u in 3 months -She will complete her breast radiation next week -She will start exemestane in 3 weeks. Prescription was called in today.   Orders Placed This Encounter  Procedures  . DG Bone Density    Standing Status:   Future    Standing Expiration Date:   07/13/2018    Order Specific Question:   Reason for Exam (SYMPTOM  OR DIAGNOSIS REQUIRED)    Answer:   sceening    Order Specific Question:   Preferred imaging location?    Answer:   [facility]    All questions were answered. The patient knows to call the clinic with any problems, questions or concerns. I spent 25 minutes counseling the patient face to face. The total time spent in the appointment was 30 minutes and more  than 50%  was on counseling.     Truitt Merle, MD 07/13/2017    This document serves as a record of services personally performed by Truitt Merle, MD. It was created on her behalf by Steva Colder, a trained medical scribe. The creation of this record is based on the scribe's personal observations and the provider's statements to them. This document has been checked and approved by the attending provider.

## 2017-07-11 NOTE — Assessment & Plan Note (Addendum)
Labs May need to d/c Lovastatin

## 2017-07-11 NOTE — Assessment & Plan Note (Signed)
Labs Wt loss 

## 2017-07-11 NOTE — Progress Notes (Signed)
Subjective:  Patient ID: Katelyn Jackson, female    DOB: November 18, 1945  Age: 71 y.o. MRN: [id number]  CC: No chief complaint on file.   HPI Katelyn Jackson presents for L breast ca on XRT (4/18)   C/o A fib - chronic; on anticoagulation C/o LBP - steroid inj helped, UTI - treated; hypothyroidism -on Rx x years Back on Valsartan 320 mg/d after her hospital stay  Outpatient Medications Prior to Visit  Medication Sig Dispense Refill  . cephALEXin (KEFLEX) 250 MG capsule Take 1 capsule (250 mg total) by mouth 2 (two) times daily. 6 capsule 0  . febuxostat (ULORIC) 40 MG tablet Take 40 mg by mouth daily.    . furosemide (LASIX) 40 MG tablet Take 40 mg by mouth 2 (two) times daily.     . hyaluronate sodium (RADIAPLEXRX) GEL Apply 1 application topically 2 (two) times daily.    [name] levothyroxine (SYNTHROID, LEVOTHROID) 50 MCG tablet Take 50 mcg by mouth daily before breakfast.     . lipase/protease/amylase (CREON) 12000 units CPEP capsule Take 12,000 Units by mouth daily as needed (stomach problems).     . lovastatin (MEVACOR) 20 MG tablet Take 20 mg by mouth daily.     . methocarbamol (ROBAXIN) 500 MG tablet Take 1 tablet (500 mg total) by mouth 4 (four) times daily. 15 tablet 0  . non-metallic deodorant (ALRA) MISC Apply 1 application topically daily as needed.    [name] omeprazole (PRILOSEC) 40 MG capsule Take 40 mg by mouth daily as needed (acid reflux).     . polyethylene glycol powder (GLYCOLAX/MIRALAX) powder Take 17 g by mouth at bedtime.      No facility-administered medications prior to visit.     ROS Review of Systems  Constitutional: Positive for unexpected weight change. Negative for activity change, appetite change, chills and fatigue.  HENT: Negative for congestion, mouth sores and sinus pressure.   Eyes: Negative for visual disturbance.  Respiratory: Negative for cough and chest tightness.   Gastrointestinal: Negative for abdominal pain and nausea.  Genitourinary: Negative  for difficulty urinating, frequency and vaginal pain.  Musculoskeletal: Positive for back pain. Negative for gait problem.  Skin: Negative for pallor and rash.  Neurological: Negative for dizziness, tremors, weakness, numbness and headaches.  Psychiatric/Behavioral: Negative for confusion and sleep disturbance.    Objective:  BP 138/76 (BP Location: Right Arm, Patient Position: Sitting, Cuff Size: Large)   Pulse 85   Temp 98.8 F (37.1 C) (Oral)   Ht 5\' 2"  (1.575 m)   Wt 260 lb (117.9 kg)   LMP  (LMP Unknown)   SpO2 99%   BMI 47.55 kg/m   BP Readings from Last 3 Encounters:  07/11/17 138/76  06/23/17 129/69  05/29/17 135/88    Wt Readings from Last 3 Encounters:  07/11/17 260 lb (117.9 kg)  06/19/17 277 lb (125.6 kg)  05/29/17 269 lb (122 kg)    Physical Exam  Constitutional: She appears well-developed. No distress.  HENT:  Head: Normocephalic.  Right Ear: External ear normal.  Left Ear: External ear normal.  Nose: Nose normal.  Mouth/Throat: Oropharynx is clear and moist.  Eyes: Pupils are equal, round, and reactive to light. Conjunctivae are normal. Right eye exhibits no discharge. Left eye exhibits no discharge.  Neck: Normal range of motion. Neck supple. No JVD present. No tracheal deviation present. No thyromegaly present.  Cardiovascular: Normal rate, regular rhythm and normal heart sounds.   Pulmonary/Chest: No stridor. No respiratory distress. She has no  wheezes.  Abdominal: Soft. Bowel sounds are normal. She exhibits no distension and no mass. There is no tenderness. There is no rebound and no guarding.  Musculoskeletal: She exhibits tenderness. She exhibits no edema.  Lymphadenopathy:    She has no cervical adenopathy.  Neurological: She displays normal reflexes. No cranial nerve deficit. She exhibits normal muscle tone. Coordination normal.  Skin: No rash noted. No erythema.  Psychiatric: She has a normal mood and affect. Her behavior is normal. Judgment  and thought content normal.  Very obese  Lab Results  Component Value Date   WBC 7.9 06/22/2017   HGB 11.2 (L) 06/22/2017   HCT 35.6 (L) 06/22/2017   PLT 203 06/22/2017   GLUCOSE 98 06/23/2017   ALT 25 06/19/2017   AST 24 06/19/2017   NA 132 (L) 06/23/2017   K 4.3 06/23/2017   CL 100 (L) 06/23/2017   CREATININE 2.32 (H) 06/23/2017   BUN 56 (H) 06/23/2017   CO2 21 (L) 06/23/2017   INR 1.53 06/23/2017    No results found.  Assessment & Plan:   There are no diagnoses linked to this encounter. I am having Ms. Lafever maintain her furosemide, levothyroxine, lovastatin, omeprazole, polyethylene glycol powder, febuxostat, lipase/protease/amylase, methocarbamol, cephALEXin, hyaluronate sodium, and non-metallic deodorant.  No orders of the defined types were placed in this encounter.    Follow-up: No Follow-up on file.  Walker Kehr, MD

## 2017-07-11 NOTE — Assessment & Plan Note (Signed)
Lovastatin 

## 2017-07-12 ENCOUNTER — Ambulatory Visit: Payer: Medicare HMO

## 2017-07-12 ENCOUNTER — Ambulatory Visit
Admission: RE | Admit: 2017-07-12 | Discharge: 2017-07-12 | Disposition: A | Discharge status: Home / Self Care | Payer: Medicare HMO | Source: Ambulatory Visit | Attending: Radiation Oncology | Admitting: Radiation Oncology

## 2017-07-12 DIAGNOSIS — Z510 Encounter for antineoplastic radiation therapy: Secondary | ICD-10-CM | POA: Diagnosis not present

## 2017-07-13 ENCOUNTER — Telehealth: Payer: Self-pay | Admitting: Hematology

## 2017-07-13 ENCOUNTER — Telehealth: Payer: Self-pay | Admitting: Internal Medicine

## 2017-07-13 ENCOUNTER — Ambulatory Visit: Payer: Medicare HMO

## 2017-07-13 ENCOUNTER — Encounter: Payer: Self-pay | Admitting: Hematology

## 2017-07-13 ENCOUNTER — Ambulatory Visit
Admission: RE | Admit: 2017-07-13 | Discharge: 2017-07-13 | Disposition: A | Discharge status: Home / Self Care | Payer: Medicare HMO | Source: Ambulatory Visit | Attending: Radiation Oncology | Admitting: Radiation Oncology

## 2017-07-13 ENCOUNTER — Ambulatory Visit (HOSPITAL_BASED_OUTPATIENT_CLINIC_OR_DEPARTMENT_OTHER): Payer: Medicare HMO | Admitting: Hematology

## 2017-07-13 VITALS — BP 115/55 | HR 80 | Temp 98.5°F | Resp 18 | Ht 62.0 in | Wt 261.7 lb

## 2017-07-13 DIAGNOSIS — Z510 Encounter for antineoplastic radiation therapy: Secondary | ICD-10-CM | POA: Diagnosis not present

## 2017-07-13 DIAGNOSIS — Z170 Estrogen receptor positive status [ER+]: Secondary | ICD-10-CM | POA: Diagnosis not present

## 2017-07-13 DIAGNOSIS — C50412 Malignant neoplasm of upper-outer quadrant of left female breast: Secondary | ICD-10-CM | POA: Diagnosis not present

## 2017-07-13 DIAGNOSIS — I482 Chronic atrial fibrillation, unspecified: Secondary | ICD-10-CM

## 2017-07-13 DIAGNOSIS — I10 Essential (primary) hypertension: Secondary | ICD-10-CM

## 2017-07-13 DIAGNOSIS — E2839 Other primary ovarian failure: Secondary | ICD-10-CM

## 2017-07-13 MED ORDER — EXEMESTANE 25 MG PO TABS: 25.0000 mg | ORAL_TABLET | Freq: Every day | ORAL | 2 refills | Status: DC

## 2017-07-13 NOTE — Telephone Encounter (Signed)
Ok Thx 

## 2017-07-13 NOTE — Telephone Encounter (Signed)
Needs verbals for physical therapy for her back for [facility]

## 2017-07-13 NOTE — Telephone Encounter (Signed)
Gave patient avs and calendar for December appointment

## 2017-07-13 NOTE — Telephone Encounter (Signed)
Called PT no answer LMOM w/MD response../lmb 

## 2017-07-14 ENCOUNTER — Ambulatory Visit
Admission: RE | Admit: 2017-07-14 | Discharge: 2017-07-14 | Disposition: A | Discharge status: Home / Self Care | Payer: Medicare HMO | Source: Ambulatory Visit | Attending: Radiation Oncology | Admitting: Radiation Oncology

## 2017-07-14 ENCOUNTER — Ambulatory Visit: Payer: Medicare HMO

## 2017-07-14 DIAGNOSIS — Z510 Encounter for antineoplastic radiation therapy: Secondary | ICD-10-CM | POA: Diagnosis not present

## 2017-07-17 ENCOUNTER — Ambulatory Visit: Payer: Medicare HMO

## 2017-07-17 ENCOUNTER — Ambulatory Visit
Admission: RE | Admit: 2017-07-17 | Discharge: 2017-07-17 | Disposition: A | Discharge status: Home / Self Care | Payer: Medicare HMO | Source: Ambulatory Visit | Attending: Radiation Oncology | Admitting: Radiation Oncology

## 2017-07-17 DIAGNOSIS — Z510 Encounter for antineoplastic radiation therapy: Secondary | ICD-10-CM | POA: Diagnosis not present

## 2017-07-18 ENCOUNTER — Telehealth: Payer: Self-pay | Admitting: Internal Medicine

## 2017-07-18 ENCOUNTER — Ambulatory Visit
Admission: RE | Admit: 2017-07-18 | Discharge: 2017-07-18 | Disposition: A | Discharge status: Home / Self Care | Payer: Medicare HMO | Source: Ambulatory Visit | Attending: Radiation Oncology | Admitting: Radiation Oncology

## 2017-07-18 ENCOUNTER — Ambulatory Visit: Payer: Medicare HMO

## 2017-07-18 DIAGNOSIS — Z510 Encounter for antineoplastic radiation therapy: Secondary | ICD-10-CM | POA: Diagnosis not present

## 2017-07-18 DIAGNOSIS — R799 Abnormal finding of blood chemistry, unspecified: Secondary | ICD-10-CM

## 2017-07-18 MED ORDER — IRBESARTAN-HYDROCHLOROTHIAZIDE 150-12.5 MG PO TABS: 1.0000 | ORAL_TABLET | Freq: Every day | ORAL | 11 refills | Status: DC

## 2017-07-18 NOTE — Telephone Encounter (Signed)
Continue Carvedilol Start Irbesartan hCT 1 a day - it should help to remove water Low sodium diet, wt loss BMET in 2-4 wks Thx

## 2017-07-18 NOTE — Telephone Encounter (Signed)
carvedilol (COREG) 6.25 MG tablet   Patient's medication was changed to this one last week. She states it is not working. She is having a lot of extra fluid. She would like to go back on the old medication. It worked better. Please advise. Thank you.

## 2017-07-19 ENCOUNTER — Encounter: Payer: Self-pay | Admitting: *Deleted

## 2017-07-19 ENCOUNTER — Ambulatory Visit
Admission: RE | Admit: 2017-07-19 | Discharge: 2017-07-19 | Disposition: A | Discharge status: Home / Self Care | Payer: Medicare HMO | Source: Ambulatory Visit | Attending: Radiation Oncology | Admitting: Radiation Oncology

## 2017-07-19 ENCOUNTER — Encounter: Payer: Self-pay | Admitting: Radiation Oncology

## 2017-07-19 DIAGNOSIS — Z510 Encounter for antineoplastic radiation therapy: Secondary | ICD-10-CM | POA: Diagnosis not present

## 2017-07-20 ENCOUNTER — Telehealth: Payer: Self-pay | Admitting: Hematology

## 2017-07-20 NOTE — Telephone Encounter (Signed)
Pts son notified and voiced understanding

## 2017-07-20 NOTE — Telephone Encounter (Signed)
Spoke with patient's son regarding the changes in her appts.

## 2017-08-02 ENCOUNTER — Ambulatory Visit (INDEPENDENT_AMBULATORY_CARE_PROVIDER_SITE_OTHER): Payer: Medicare HMO | Admitting: General Practice

## 2017-08-02 DIAGNOSIS — I482 Chronic atrial fibrillation, unspecified: Secondary | ICD-10-CM

## 2017-08-02 DIAGNOSIS — Z7901 Long term (current) use of anticoagulants: Secondary | ICD-10-CM | POA: Insufficient documentation

## 2017-08-02 LAB — POCT INR: INR: 3.5

## 2017-08-02 NOTE — Progress Notes (Signed)
I have reviewed and agree with the plan. 

## 2017-08-02 NOTE — Patient Instructions (Signed)
Pre visit review using our clinic review tool, if applicable. No additional management support is needed unless otherwise documented below in the visit note. 

## 2017-08-02 NOTE — Progress Notes (Signed)
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       ICD-10-CM   1. Malignant neoplasm of upper-outer quadrant of left breast in female, estrogen receptor positive ([place]) C50.412    Z17.0     Indication for treatment:  Curative      Radiation treatment dates:   06/06/2017 to 07/19/2017  Site/dose:    1. The Left breast was treated to 42.5 Gy in 17 fractions at 2.5 Gy per fraction. 2. The Left breast was boosted to 7.5 Gy in 3 fractions at 2.5 Gy per fraction.   Beams/energy:    1. 3D // 10X, 15X, 6X 2. Photon boost // 6X, 10X  Narrative: The patient tolerated radiation treatment relatively well. She developed erythema in the treatment field with no moist desquamation. Denies pain or fatigue. Using Radioplex as directed. Denies itching or burning to the breast.  Plan: The patient has completed radiation treatment. The patient will return to radiation oncology clinic for routine followup in one month. I advised them to call or return sooner if they have any questions or concerns related to their recovery or treatment.  ------------------------------------------------  Jodelle Gross, MD, PhD  This document serves as a record of services personally performed by Kyung Rudd, MD. It was created on his behalf by Arlyce Harman, a trained medical scribe. The creation of this record is based on the scribe's personal observations and the provider's statements to them. This document has been checked and approved by the attending provider.

## 2017-08-28 ENCOUNTER — Ambulatory Visit
Admission: RE | Admit: 2017-08-28 | Discharge: 2017-08-28 | Disposition: A | Discharge status: Home / Self Care | Payer: Medicare HMO | Source: Ambulatory Visit | Attending: Hematology | Admitting: Hematology

## 2017-08-28 DIAGNOSIS — E2839 Other primary ovarian failure: Secondary | ICD-10-CM

## 2017-08-29 ENCOUNTER — Encounter: Payer: Self-pay | Admitting: Internal Medicine

## 2017-08-29 ENCOUNTER — Ambulatory Visit (INDEPENDENT_AMBULATORY_CARE_PROVIDER_SITE_OTHER): Payer: Medicare HMO | Admitting: Internal Medicine

## 2017-08-29 VITALS — BP 124/72 | HR 68 | Temp 98.4°F | Ht 62.0 in | Wt 270.0 lb

## 2017-08-29 DIAGNOSIS — I10 Essential (primary) hypertension: Secondary | ICD-10-CM | POA: Diagnosis not present

## 2017-08-29 DIAGNOSIS — N179 Acute kidney failure, unspecified: Secondary | ICD-10-CM | POA: Diagnosis not present

## 2017-08-29 DIAGNOSIS — M5432 Sciatica, left side: Secondary | ICD-10-CM | POA: Insufficient documentation

## 2017-08-29 DIAGNOSIS — E876 Hypokalemia: Secondary | ICD-10-CM

## 2017-08-29 DIAGNOSIS — R739 Hyperglycemia, unspecified: Secondary | ICD-10-CM | POA: Diagnosis not present

## 2017-08-29 MED ORDER — POTASSIUM CHLORIDE ER 8 MEQ PO TBCR: 8.0000 meq | EXTENDED_RELEASE_TABLET | Freq: Every day | ORAL | 3 refills | Status: DC

## 2017-08-29 MED ORDER — FUROSEMIDE 40 MG PO TABS: 40.0000 mg | ORAL_TABLET | Freq: Every day | ORAL | 3 refills | Status: DC | PRN

## 2017-08-29 MED ORDER — TRAMADOL HCL 50 MG PO TABS: 50.0000 mg | ORAL_TABLET | Freq: Four times a day (QID) | ORAL | 0 refills | Status: DC | PRN

## 2017-08-29 NOTE — Assessment & Plan Note (Signed)
Epidural inj

## 2017-08-29 NOTE — Patient Instructions (Signed)
Wt Readings from Last 3 Encounters:  08/29/17 270 lb (122.5 kg)  07/13/17 261 lb 11.2 oz (118.7 kg)  07/11/17 260 lb (117.9 kg)

## 2017-08-29 NOTE — Assessment & Plan Note (Signed)
Furosemide OK per Dr Aurelio Jew

## 2017-08-29 NOTE — Assessment & Plan Note (Addendum)
Worse: Wt Readings from Last 3 Encounters:  08/29/17 270 lb (122.5 kg)  07/13/17 261 lb 11.2 oz (118.7 kg)  07/11/17 260 lb (117.9 kg)    Needs to loose wt - diet discussed

## 2017-08-29 NOTE — Progress Notes (Signed)
Subjective:  Patient ID: Katelyn Jackson, female    DOB: Jul 07, 1946  Age: 71 y.o. MRN: [id number]  CC: No chief complaint on file.   HPI Katelyn Jackson presents for breast ca; wt gain, OA, HTN, elevated glucose Pt saw Dr Florene Glen - meds - as on the list  Outpatient Medications Prior to Visit  Medication Sig Dispense Refill  . carvedilol (COREG) 6.25 MG tablet Take 1 tablet (6.25 mg total) by mouth 2 (two) times daily with a meal. 60 tablet 11  . Cholecalciferol (VITAMIN D3) 2000 units capsule Take 1 capsule (2,000 Units total) by mouth daily. 100 capsule 3  . exemestane (AROMASIN) 25 MG tablet Take 1 tablet (25 mg total) by mouth daily after breakfast. 30 tablet 2  . febuxostat (ULORIC) 40 MG tablet Take 40 mg by mouth daily.    . hyaluronate sodium (RADIAPLEXRX) GEL Apply 1 application topically 2 (two) times daily.    . irbesartan-hydrochlorothiazide (AVALIDE) 150-12.5 MG tablet Take 1 tablet by mouth daily. 30 tablet 11  . levothyroxine (SYNTHROID, LEVOTHROID) 50 MCG tablet Take 50 mcg by mouth daily before breakfast.     . lipase/protease/amylase (CREON) 12000 units CPEP capsule Take 12,000 Units by mouth daily as needed (stomach problems).     . lovastatin (MEVACOR) 20 MG tablet Take 20 mg by mouth daily.     . methocarbamol (ROBAXIN) 500 MG tablet Take 1 tablet (500 mg total) by mouth 4 (four) times daily. 15 tablet 0  . non-metallic deodorant (ALRA) MISC Apply 1 application topically daily as needed.    [name] omeprazole (PRILOSEC) 40 MG capsule Take 40 mg by mouth daily as needed (acid reflux).     . polyethylene glycol powder (GLYCOLAX/MIRALAX) powder Take 17 g by mouth at bedtime.      No facility-administered medications prior to visit.     ROS Review of Systems  Constitutional: Positive for fatigue and unexpected weight change. Negative for activity change, appetite change and chills.  HENT: Negative for congestion, mouth sores and sinus pressure.   Eyes: Negative for  visual disturbance.  Respiratory: Negative for cough and chest tightness.   Gastrointestinal: Negative for abdominal pain and nausea.  Genitourinary: Negative for difficulty urinating, frequency and vaginal pain.  Musculoskeletal: Negative for back pain and gait problem.  Skin: Negative for pallor and rash.  Neurological: Negative for dizziness, tremors, weakness, numbness and headaches.  Psychiatric/Behavioral: Negative for confusion, sleep disturbance and suicidal ideas.    Objective:  BP 124/72 (BP Location: Right Arm, Patient Position: Sitting, Cuff Size: Large)   Pulse 68   Temp 98.4 F (36.9 C) (Oral)   Ht 5\' 2"  (1.575 m)   Wt 270 lb (122.5 kg)   LMP  (LMP Unknown)   SpO2 98%   BMI 49.38 kg/m   BP Readings from Last 3 Encounters:  08/29/17 124/72  07/13/17 (!) 115/55  07/11/17 138/76    Wt Readings from Last 3 Encounters:  08/29/17 270 lb (122.5 kg)  07/13/17 261 lb 11.2 oz (118.7 kg)  07/11/17 260 lb (117.9 kg)    Physical Exam  Constitutional: She appears well-developed. No distress.  HENT:  Head: Normocephalic.  Right Ear: External ear normal.  Left Ear: External ear normal.  Nose: Nose normal.  Mouth/Throat: Oropharynx is clear and moist.  Eyes: Pupils are equal, round, and reactive to light. Conjunctivae are normal. Right eye exhibits no discharge. Left eye exhibits no discharge.  Neck: Normal range of motion. Neck supple. No JVD present. No  tracheal deviation present. No thyromegaly present.  Cardiovascular: Normal rate, regular rhythm and normal heart sounds.   Pulmonary/Chest: No stridor. No respiratory distress. She has no wheezes.  Abdominal: Soft. Bowel sounds are normal. She exhibits no distension and no mass. There is no tenderness. There is no rebound and no guarding.  Musculoskeletal: She exhibits no edema or tenderness.  Lymphadenopathy:    She has no cervical adenopathy.  Neurological: She displays normal reflexes. No cranial nerve deficit. She  exhibits normal muscle tone. Coordination normal.  Skin: No rash noted. No erythema.  Psychiatric: She has a normal mood and affect. Her behavior is normal. Judgment and thought content normal.    Lab Results  Component Value Date   WBC 7.9 06/22/2017   HGB 11.2 (L) 06/22/2017   HCT 35.6 (L) 06/22/2017   PLT 203 06/22/2017   GLUCOSE 155 (H) 07/11/2017   ALT 20 07/11/2017   AST 18 07/11/2017   NA 139 07/11/2017   K 3.5 07/11/2017   CL 100 07/11/2017   CREATININE 1.43 (H) 07/11/2017   BUN 23 07/11/2017   CO2 28 07/11/2017   TSH 2.09 07/11/2017   INR 3.5 08/02/2017   HGBA1C 5.7 07/11/2017    Dg Bone Density  Result Date: 08/28/2017 EXAM: DUAL X-RAY ABSORPTIOMETRY (DXA) FOR BONE MINERAL DENSITY IMPRESSION: Referring Physician:  Truitt Merle PATIENT: Name: Katelyn Jackson, Katelyn Jackson Patient ID: [id number] Birth Date: 07/26/1946 Height: 60.0 in. Sex: Female Measured: 08/28/2017 Weight: 261.9 lbs. Indications: Advanced Age, Aromasin, Breast Cancer History, Caucasian, Estrogen Deficient, Levothyroxine, Postmenopausal Fractures: None Treatments: Calcium (E943.0), Hormone Therapy For Cancer, Vitamin D (E933.5) ASSESSMENT: The BMD measured at Femur Neck Left is 0.848 g/cm2 with a T-score of -1.4. This patient is considered osteopenic according to [place] [facility]) criteria. L-4 was excluded due to degenerative changes. Site Region Measured Date Measured Age YA BMD Significant CHANGE T-score DualFemur Neck Left 08/28/2017    71.7         -1.4    0.848 g/cm2 AP Spine  L1-L3     8/28/2017    71.7         -1.1    1.052 g/cm2 World Health Organization [facility]) criteria for post-menopausal, Caucasian Women: Normal       T-score at or above -1 SD Osteopenia   T-score between -1 and -2.5 SD Osteoporosis T-score at or below -2.5 SD RECOMMENDATION: [place] recommends that FDA-approved medical therapies be considered in postmenopausal women and men age 18 or older with a: 1. Hip or  vertebral (clinical or morphometric) fracture. 2. T-score of <-2.5 at the spine or hip. 3. Ten-year fracture probability by FRAX of 3% or greater for hip fracture or 20% or greater for major osteoporotic fracture. All treatment decisions require clinical judgment and consideration of individual patient factors, including patient preferences, co-morbidities, previous drug use, risk factors not captured in the FRAX model (e.g. falls, vitamin D deficiency, increased bone turnover, interval significant decline in bone density) and possible under - or over-estimation of fracture risk by FRAX. All patients should ensure an adequate intake of dietary calcium (1200 mg/d) and vitamin D (800 IU daily) unless contraindicated. FOLLOW-UP: People with diagnosed cases of osteoporosis or at high risk for fracture should have regular bone mineral density tests. For patients eligible for Medicare, routine testing is allowed once every 2 years. The testing frequency can be increased to one year for patients who have rapidly progressing disease, those who are receiving or discontinuing medical therapy to  restore bone mass, or have additional risk factors. I have reviewed this report, and agree with the above findings. [facility] FRAX* 10-year Probability of Fracture Based on femoral neck BMD: DualFemur (Left) Major Osteoporotic Fracture: 8.0% Hip Fracture:                1.0% Population:                  [place] (Caucasian) Risk Factors:                None *FRAX is a Materials engineer of the [employer] of [employer] for Metabolic Bone Disease, a World Pharmacologist (WHO) [facility]. ASSESSMENT: The probability of a major osteoporotic fracture is 8.0 % within the next ten years. The probability of a hip fracture is 1.0 % within the next ten years. Electronically Signed   By: Rolm Baptise M.D.   On: 08/28/2017 09:42    Assessment & Plan:   There are no diagnoses linked to this encounter. I am  having Katelyn Jackson maintain her levothyroxine, lovastatin, omeprazole, polyethylene glycol powder, febuxostat, lipase/protease/amylase, methocarbamol, hyaluronate sodium, non-metallic deodorant, Vitamin D3, carvedilol, exemestane, and irbesartan-hydrochlorothiazide.  No orders of the defined types were placed in this encounter.    Follow-up: No Follow-up on file.  Walker Kehr, MD

## 2017-09-01 ENCOUNTER — Ambulatory Visit (INDEPENDENT_AMBULATORY_CARE_PROVIDER_SITE_OTHER): Payer: Medicare HMO | Admitting: General Practice

## 2017-09-01 DIAGNOSIS — I482 Chronic atrial fibrillation, unspecified: Secondary | ICD-10-CM

## 2017-09-01 DIAGNOSIS — Z7901 Long term (current) use of anticoagulants: Secondary | ICD-10-CM | POA: Diagnosis not present

## 2017-09-01 LAB — POCT INR: INR: 4

## 2017-09-01 NOTE — Patient Instructions (Signed)
Pre visit review using our clinic review tool, if applicable. No additional management support is needed unless otherwise documented below in the visit note. 

## 2017-09-05 ENCOUNTER — Ambulatory Visit
Admission: RE | Admit: 2017-09-05 | Discharge: 2017-09-05 | Disposition: A | Discharge status: Home / Self Care | Payer: Medicare HMO | Source: Ambulatory Visit | Attending: Radiation Oncology | Admitting: Radiation Oncology

## 2017-09-05 ENCOUNTER — Encounter: Payer: Self-pay | Admitting: Radiation Oncology

## 2017-09-05 VITALS — BP 144/90 | HR 76 | Temp 98.1°F | Resp 18 | Ht 62.0 in | Wt 276.4 lb

## 2017-09-05 DIAGNOSIS — I129 Hypertensive chronic kidney disease with stage 1 through stage 4 chronic kidney disease, or unspecified chronic kidney disease: Secondary | ICD-10-CM | POA: Diagnosis not present

## 2017-09-05 DIAGNOSIS — I890 Lymphedema, not elsewhere classified: Secondary | ICD-10-CM | POA: Diagnosis not present

## 2017-09-05 DIAGNOSIS — K219 Gastro-esophageal reflux disease without esophagitis: Secondary | ICD-10-CM | POA: Diagnosis not present

## 2017-09-05 DIAGNOSIS — Z170 Estrogen receptor positive status [ER+]: Secondary | ICD-10-CM | POA: Diagnosis not present

## 2017-09-05 DIAGNOSIS — N189 Chronic kidney disease, unspecified: Secondary | ICD-10-CM | POA: Diagnosis not present

## 2017-09-05 DIAGNOSIS — Z8249 Family history of ischemic heart disease and other diseases of the circulatory system: Secondary | ICD-10-CM | POA: Diagnosis not present

## 2017-09-05 DIAGNOSIS — Z79899 Other long term (current) drug therapy: Secondary | ICD-10-CM | POA: Diagnosis not present

## 2017-09-05 DIAGNOSIS — Z79891 Long term (current) use of opiate analgesic: Secondary | ICD-10-CM | POA: Insufficient documentation

## 2017-09-05 DIAGNOSIS — C50412 Malignant neoplasm of upper-outer quadrant of left female breast: Secondary | ICD-10-CM

## 2017-09-05 DIAGNOSIS — Z7901 Long term (current) use of anticoagulants: Secondary | ICD-10-CM | POA: Insufficient documentation

## 2017-09-05 DIAGNOSIS — I482 Chronic atrial fibrillation: Secondary | ICD-10-CM | POA: Insufficient documentation

## 2017-09-05 DIAGNOSIS — I10 Essential (primary) hypertension: Secondary | ICD-10-CM | POA: Insufficient documentation

## 2017-09-05 DIAGNOSIS — I272 Pulmonary hypertension, unspecified: Secondary | ICD-10-CM | POA: Insufficient documentation

## 2017-09-05 DIAGNOSIS — M1990 Unspecified osteoarthritis, unspecified site: Secondary | ICD-10-CM | POA: Diagnosis not present

## 2017-09-05 DIAGNOSIS — Z9889 Other specified postprocedural states: Secondary | ICD-10-CM | POA: Insufficient documentation

## 2017-09-05 DIAGNOSIS — Z510 Encounter for antineoplastic radiation therapy: Secondary | ICD-10-CM | POA: Insufficient documentation

## 2017-09-05 DIAGNOSIS — E669 Obesity, unspecified: Secondary | ICD-10-CM | POA: Diagnosis not present

## 2017-09-05 DIAGNOSIS — N611 Abscess of the breast and nipple: Secondary | ICD-10-CM | POA: Diagnosis not present

## 2017-09-05 NOTE — Progress Notes (Signed)
Radiation Oncology         [phone] ________________________________  Name: Katelyn Jackson MRN: [id number]  Date of Service: 09/05/2017  DOB: April 16, 1946  Post Treatment Note  CC: Plotnikov, [name], MD  Katelyn Klein, MD  Diagnosis:  Stage IA, pT1bN0Mx grade 2, ER/PR positive invasive ductal carcinoma and DCIS of the left breast.  Interval Since Last Radiation:  7 weeks   06/06/2017 to 07/19/2017:  1. The Left breast was treated to 42.5 Gy in 17 fractions at 2.5 Gy per fraction. 2. The Left breast was boosted to 7.5 Gy in 3 fractions at 2.5 Gy per fraction.    Narrative:  The patient returns today for routine follow-up. During treatment she did very well with radiotherapy and did not have significant desquamation.                             On review of systems, the patient with the assistance of an interpreter, states she's doing well with only mild tanning left around the nipple area. She denies any concerns with taking her AI. She continues to notice some LE edema. She denies any other concerns or symptoms at this time.  ALLERGIES:  is allergic to chlorhexidine gluconate and penicillins.  Meds: Current Outpatient Prescriptions  Medication Sig Dispense Refill  . carvedilol (COREG) 6.25 MG tablet Take 1 tablet (6.25 mg total) by mouth 2 (two) times daily with a meal. 60 tablet 11  . Cholecalciferol (VITAMIN D3) 2000 units capsule Take 1 capsule (2,000 Units total) by mouth daily. 100 capsule 3  . exemestane (AROMASIN) 25 MG tablet Take 1 tablet (25 mg total) by mouth daily after breakfast. 30 tablet 2  . febuxostat (ULORIC) 40 MG tablet Take 40 mg by mouth daily.    . furosemide (LASIX) 40 MG tablet Take 1 tablet (40 mg total) by mouth daily as needed. 90 tablet 3  . irbesartan-hydrochlorothiazide (AVALIDE) 150-12.5 MG tablet Take 1 tablet by mouth daily. 30 tablet 11  . levothyroxine (SYNTHROID, LEVOTHROID) 50 MCG tablet Take 50 mcg by mouth daily before breakfast.       . lipase/protease/amylase (CREON) 12000 units CPEP capsule Take 12,000 Units by mouth daily as needed (stomach problems).     . lovastatin (MEVACOR) 20 MG tablet Take 20 mg by mouth daily.     . methocarbamol (ROBAXIN) 500 MG tablet Take 1 tablet (500 mg total) by mouth 4 (four) times daily. 15 tablet 0  . omeprazole (PRILOSEC) 40 MG capsule Take 40 mg by mouth daily as needed (acid reflux).     . polyethylene glycol powder (GLYCOLAX/MIRALAX) powder Take 17 g by mouth at bedtime.     . potassium chloride (KLOR-CON) 8 MEQ tablet Take 1 tablet (8 mEq total) by mouth daily. 90 tablet 3  . traMADol (ULTRAM) 50 MG tablet Take 1 tablet (50 mg total) by mouth every 6 (six) hours as needed. (Patient not taking: Reported on 09/05/2017) 28 tablet 0   No current facility-administered medications for this encounter.     Physical Findings:  height is 5\' 2"  (1.575 m) and weight is 276 lb 6.4 oz (125.4 kg). Her oral temperature is 98.1 F (36.7 C). Her blood pressure is 144/90 (abnormal) and her pulse is 76. Her respiration is 18 and oxygen saturation is 97%.  Pain Assessment Pain Score: 0-No pain/10 In general this is a well appearing caucasian female in no acute distress. She's alert and oriented  x4 and appropriate throughout the examination. Cardiopulmonary assessment is negative for acute distress and she exhibits normal effort. The left breast was examined and reveals mild hyperpigmentation of the areola. No desquamation is noted. She has trace edema of bilateral lower extremities.    Lab Findings: Lab Results  Component Value Date   WBC 7.9 06/22/2017   HGB 11.2 (L) 06/22/2017   HCT 35.6 (L) 06/22/2017   MCV 89.0 06/22/2017   PLT 203 06/22/2017     Radiographic Findings: Dg Bone Density  Result Date: 08/28/2017 EXAM: DUAL X-RAY ABSORPTIOMETRY (DXA) FOR BONE MINERAL DENSITY IMPRESSION: Referring Physician:  Truitt Merle PATIENT: Name: Katelyn Jackson Patient ID: [id number] Birth Date:  1946-10-03 Height: 60.0 in. Sex: Female Measured: 08/28/2017 Weight: 261.9 lbs. Indications: Advanced Age, Aromasin, Breast Cancer History, Caucasian, Estrogen Deficient, Levothyroxine, Postmenopausal Fractures: None Treatments: Calcium (E943.0), Hormone Therapy For Cancer, Vitamin D (E933.5) ASSESSMENT: The BMD measured at Femur Neck Left is 0.848 g/cm2 with a T-score of -1.4. This patient is considered osteopenic according to [place] [facility]) criteria. L-4 was excluded due to degenerative changes. Site Region Measured Date Measured Age YA BMD Significant CHANGE T-score DualFemur Neck Left 08/28/2017    71.7         -1.4    0.848 g/cm2 AP Spine  L1-L3     8/28/2017    71.7         -1.1    1.052 g/cm2 World Health Organization [facility]) criteria for post-menopausal, Caucasian Women: Normal       T-score at or above -1 SD Osteopenia   T-score between -1 and -2.5 SD Osteoporosis T-score at or below -2.5 SD RECOMMENDATION: [place] recommends that FDA-approved medical therapies be considered in postmenopausal women and men age 91 or older with a: 1. Hip or vertebral (clinical or morphometric) fracture. 2. T-score of <-2.5 at the spine or hip. 3. Ten-year fracture probability by FRAX of 3% or greater for hip fracture or 20% or greater for major osteoporotic fracture. All treatment decisions require clinical judgment and consideration of individual patient factors, including patient preferences, co-morbidities, previous drug use, risk factors not captured in the FRAX model (e.g. falls, vitamin D deficiency, increased bone turnover, interval significant decline in bone density) and possible under - or over-estimation of fracture risk by FRAX. All patients should ensure an adequate intake of dietary calcium (1200 mg/d) and vitamin D (800 IU daily) unless contraindicated. FOLLOW-UP: People with diagnosed cases of osteoporosis or at high risk for fracture should have regular bone mineral  density tests. For patients eligible for Medicare, routine testing is allowed once every 2 years. The testing frequency can be increased to one year for patients who have rapidly progressing disease, those who are receiving or discontinuing medical therapy to restore bone mass, or have additional risk factors. I have reviewed this report, and agree with the above findings. [facility] FRAX* 10-year Probability of Fracture Based on femoral neck BMD: DualFemur (Left) Major Osteoporotic Fracture: 8.0% Hip Fracture:                1.0% Population:                  [place] (Caucasian) Risk Factors:                None *FRAX is a Materials engineer of the [employer] for Metabolic Bone Disease, a World Pharmacologist (WHO) [facility]. ASSESSMENT: The probability of a major osteoporotic  fracture is 8.0 % within the next ten years. The probability of a hip fracture is 1.0 % within the next ten years. Electronically Signed   By: Rolm Baptise M.D.   On: 08/28/2017 09:42    Impression/Plan: 1. Stage IA, pT1bN0Mx grade 2, ER/PR positive invasive ductal carcinoma and DCIS of the left breast.. The patient has been doing well since completion of radiotherapy. We discussed that we would be happy to continue to follow her as needed, but she will also continue to follow up with Dr. Burr Medico in medical oncology. She was counseled on skin care as well as measures to avoid sun exposure to this area.  2. Survivorship. She is due to be seen in survivorship clinic in December and we reviewed the role for this.  3. Bilateral lower extremity edema. The patient will follow up with her PCP for this but is counseled to be evaluated for this if her symptoms become assymetric or progressive.     Carola Rhine, PAC

## 2017-09-05 NOTE — Addendum Note (Signed)
Encounter addended by: Malena Edman, RN on: 09/05/2017  9:39 AM<BR>    Actions taken: Charge Capture section accepted

## 2017-09-06 ENCOUNTER — Telehealth: Payer: Self-pay | Admitting: *Deleted

## 2017-09-06 NOTE — Telephone Encounter (Signed)
Called pt & she gave phone to daughter b/c she states her english is not that good.  Informed daughter that pt's bone density shows osteopenia & she should be taking Vit D & Calcium daily.  She is on Vit D but not Calcium.  Informed to get OTC & start & try to do weight bearing exercises. Daughter expressed understanding.

## 2017-09-06 NOTE — Telephone Encounter (Signed)
-----   Message from Truitt Merle, MD sent at 09/06/2017  9:05 AM EDT ----- Please let pt know her DEXA result, and encourage her take calcium and Vit D for her osteopenia.   Truitt Merle  09/06/2017

## 2017-09-26 ENCOUNTER — Ambulatory Visit (INDEPENDENT_AMBULATORY_CARE_PROVIDER_SITE_OTHER): Payer: Medicare HMO | Admitting: General Practice

## 2017-09-26 DIAGNOSIS — I482 Chronic atrial fibrillation, unspecified: Secondary | ICD-10-CM

## 2017-09-26 DIAGNOSIS — Z7901 Long term (current) use of anticoagulants: Secondary | ICD-10-CM

## 2017-09-26 LAB — POCT INR: INR: 3.6

## 2017-09-26 NOTE — Patient Instructions (Addendum)
Pre visit review using our clinic review tool, if applicable. No additional management support is needed unless otherwise documented below in the visit note.  skip coumadin today (11/20) and start taking 1 tablet daily except nothing on Wednesdays.  Re-check in 4 weeks.

## 2017-10-04 ENCOUNTER — Telehealth: Payer: Self-pay

## 2017-10-04 NOTE — Telephone Encounter (Signed)
Spoke with patients son about upcoming [facility] appt on 10/12/17 at 9:30 am.  Son states that she will come to appt.

## 2017-10-07 DIAGNOSIS — I890 Lymphedema, not elsewhere classified: Secondary | ICD-10-CM | POA: Diagnosis not present

## 2017-10-08 DIAGNOSIS — I890 Lymphedema, not elsewhere classified: Secondary | ICD-10-CM | POA: Diagnosis not present

## 2017-10-09 DIAGNOSIS — I890 Lymphedema, not elsewhere classified: Secondary | ICD-10-CM | POA: Diagnosis not present

## 2017-10-10 DIAGNOSIS — I890 Lymphedema, not elsewhere classified: Secondary | ICD-10-CM | POA: Diagnosis not present

## 2017-10-11 DIAGNOSIS — I890 Lymphedema, not elsewhere classified: Secondary | ICD-10-CM | POA: Diagnosis not present

## 2017-10-11 NOTE — Progress Notes (Signed)
CLINIC:  Survivorship   REASON FOR VISIT:  Routine follow-up post-treatment for a recent history of breast cancer.  BRIEF ONCOLOGIC HISTORY:  Oncology History   Cancer Staging Malignant neoplasm of upper-outer quadrant of left breast in female, estrogen receptor positive ([place]) Staging form: Breast, AJCC 8th Edition - Clinical stage from 02/06/2017: Stage IA (cT1b(m), cN0, cM0, G1, ER: Positive, PR: Positive, HER2: Negative) - Signed by Katelyn Merle, MD on 02/14/2017 - Pathologic stage from 04/04/2017: Stage IA (pT1b(m), pN0, cM0, G2, ER: Positive, PR: Positive, HER2: Negative, Oncotype DX score: 18) - Signed by Katelyn Merle, MD on 06/30/2017       Malignant neoplasm of upper-outer quadrant of left breast in female, estrogen receptor positive ([place])   01/26/2017 Mammogram    Screening mammogram on 01/26/17 showed possible asymmetries in the left breast.       02/02/2017 Mammogram    Diagnostic mammogram and [place]: 1. An irregular hypoechoic mass in the left breast at 1 o'clock 7 cm from the nipple measuring 6 x 3 x 4 mm.  2. There is an irregular hypoechoic mass in left breast at 3 o'clock 7 cm from the nipple measuring 4 x 4 x 3 mm. 3. There is a subtle hypoechoic mass in the left breast at 2:30 5 cm from the nipple measuring 3 x 3 x 3 mm 4. There are multiple other small cysts in the left breast  5. Left axilla (-) on [place]       02/06/2017 Initial Biopsy    Diagnosis 1. Breast, left, needle core biopsy, 1:00 o'clock, 7 CMFN - INVASIVE DUCTAL CARCINOMA, SEE COMMENT. - DUCTAL CARCINOMA IN SITU. 2. Breast, left, needle core biopsy, 3:00 o'clock, 7 CMFN - INVASIVE DUCTAL CARCINOMA, SEE COMMENT. - DUCTAL CARCINOMA IN SITU. Microscopic Comment 1. The carcinoma in parts #1 and 2 appear morphologically similar and grade 1.      02/06/2017 Receptors her2    Both biopsy ER 100%+, PR 100%+, HER2-, Ki67 10%      02/06/2017 Initial Diagnosis    Malignant neoplasm of upper-outer quadrant of left  breast in female, estrogen receptor positive ([place])      04/04/2017 Surgery    Left breast lumpectomy and SLN biopsy       04/04/2017 Pathology Results    Diagnosis 1. Breast, lumpectomy, Left - MULTIFOCAL INVASIVE DUCTAL CARCINOMA, [place]TINGHAM GRADE 2 OF 3, 0.9 CM - DUCTAL CARCINOMA IN SITU, LOW GRADE (<1MM FROM MEDIAL MARGIN) - FIBROCYSTIC AND COLUMNAR CELL CHANGE - CALCIFICATIONS ASSOCIATED WITH CARCINOMA AND BENIGN DISEASE - MARGINS UNINVOLVED BY CARCINOMA - PREVIOUS BIOPSY SITE CHANGES - SEE ONCOLOGY TABLE BELOW 2. Lymph node, sentinel, biopsy, Left Axillary #1 - NO CARCINOMA IDENTIFIED IN ONE LYMPH NODE (0/1) 3. Lymph node, sentinel, biopsy, Left Axillary #2 - NO CARCINOMA IDENTIFIED IN ONE LYMPH NODE (0/1)      04/04/2017 Oncotype testing    RS 18, which predicts 10-year distant recurrence risk of 12% with tamoxifen       04/26/2017 Surgery    Re-excision for close margin, final pathyology was negative for cancer       06/06/2017 - 07/19/2017 Radiation Therapy    The Left breast was treated to 42.5 Gy in 17 fractions at 2.5 Gy per fraction. 2. The Left breast was boosted to 7.5 Gy in 3 fractions at 2.5 Gy per fraction.         06/18/2017 - 06/23/2017 [facility] Admission    Admit date: 06/18/17 Admission diagnosis: Intractable  lower back pain- sciatica  Additional comments: Sent home with course of steroids and referral for f/u for back injections. Did have AKI w/ CR at 2/0, up from her baseline of 1.6. This slowed with IVF. Hypokalemia improved with K replacement. Radiotherapy was stopped at that time as she was unable to lay on her back due to pain.       08/2017 -  Anti-estrogen oral therapy    Exemestane daily       INTERVAL HISTORY:  Katelyn Jackson presents to the [facility] today for our initial meeting to review her survivorship care plan detailing her treatment course for breast cancer, as well as monitoring long-term side effects of that treatment,  education regarding health maintenance, screening, and overall wellness and health promotion.     Overall, Katelyn Jackson reports feeling quite well.  She is accompanied by Katelyn Jackson interpreter Katelyn Jackson.  She is confused because she was under the impression she would find out if she needs to stop Exemestane today, or if she needs to continue it.  She also thought she was going to have labs done today and is confused as to why she hasn't.  She says that she has had some swelling in her legs and in her left arm greater than right.  She denies any other issues today.  She notes she is tolerating the Exemestane well.  She has some mild hot flashes, however denies joint aches and pains.    REVIEW OF SYSTEMS:  Review of Systems  Constitutional: Negative for appetite change, chills, fatigue and fever.  HENT:   Negative for hearing loss.   Eyes: Negative for eye problems and icterus.  Respiratory: Negative for chest tightness, cough and shortness of breath.   Cardiovascular: Positive for leg swelling. Negative for chest pain and palpitations.  Gastrointestinal: Negative for abdominal distention, abdominal pain, constipation, diarrhea, nausea and vomiting.  Endocrine: Negative for hot flashes.  Skin: Negative for itching and rash.  Neurological: Negative for dizziness.  Hematological: Negative for adenopathy. Does not bruise/bleed easily.  Psychiatric/Behavioral: Negative for depression. The patient is not nervous/anxious.   Breast: Denies any new nodularity, masses, tenderness, nipple changes, or nipple discharge.      ONCOLOGY TREATMENT TEAM:  1. Surgeon:  Dr. Barry [facility] at [facility] 2. Medical Oncologist: Dr. Burr Jackson  3. Radiation Oncologist: Dr. Lisbeth Jackson    PAST MEDICAL/SURGICAL HISTORY:  Past Medical History:  Diagnosis Date  . Arthritis   . Cancer Katelyn Jackson)    breast cancer  . Chronic atrial fibrillation ([place])   . Chronic kidney disease    sees Dr Katelyn Jackson  . Cramps, muscle,  general   . Dyspnea   . Dyspnea on exertion   . Dysrhythmia   . GERD (gastroesophageal reflux disease)   . Headache   . Hyperlipidemia   . Hypertension   . Hypothyroidism   . Lymphedema   . Moderate to severe pulmonary hypertension ([place])   . Obesity    Past Surgical History:  Procedure Laterality Date  . APPENDECTOMY    . BREAST BIOPSY Left 2018  . BREAST LUMPECTOMY WITH NEEDLE LOCALIZATION AND AXILLARY SENTINEL LYMPH NODE BX Left 04/04/2017   Procedure: BREAST LUMPECTOMY WITH NEEDLE LOCALIZATION x3 AND AXILLARY SENTINEL LYMPH NODE BX;  Surgeon: [name], MD;  Location: [facility];  Service: General;  Laterality: Left;  . CATARACT EXTRACTION W/ INTRAOCULAR LENS  IMPLANT, BILATERAL Bilateral 2018  . COLONOSCOPY    . EYE SURGERY    . GLAUCOMA  SURGERY Bilateral 2018  . RE-EXCISION OF BREAST CANCER,SUPERIOR MARGINS Left 04/26/2017   Procedure: RE-EXCISION OF LEFT BREAST CANCER;  Surgeon: [name], MD;  Location: [facility];  Service: General;  Laterality: Left;     ALLERGIES:  Allergies  Allergen Reactions  . Chlorhexidine Gluconate Hives  . Penicillins Itching    Has patient had a PCN reaction causing immediate rash, facial/tongue/throat swelling, SOB or lightheadedness with hypotension: no Has patient had a PCN reaction causing severe rash involving mucus membranes or skin necrosis: No Has patient had a PCN reaction that required hospitalization: No Has patient had a PCN reaction occurring within the last 10 years: No If all of the above answers are "NO", then may proceed with Cephalosporin use.     CURRENT MEDICATIONS:  Outpatient Encounter Medications as of 10/12/2017  Medication Sig  . carvedilol (COREG) 6.25 MG tablet Take 1 tablet (6.25 mg total) by mouth 2 (two) times daily with a meal.  . Cholecalciferol (VITAMIN D3) 2000 units capsule Take 1 capsule (2,000 Units total) by mouth daily.  [name] exemestane (AROMASIN) 25 MG tablet Take 1 tablet (25 mg total) by mouth daily  after breakfast.  . febuxostat (ULORIC) 40 MG tablet Take 40 mg by mouth daily.  . furosemide (LASIX) 40 MG tablet Take 1 tablet (40 mg total) by mouth daily as needed.  . irbesartan-hydrochlorothiazide (AVALIDE) 150-12.5 MG tablet Take 1 tablet by mouth daily.  [name] levothyroxine (SYNTHROID, LEVOTHROID) 50 MCG tablet Take 50 mcg by mouth daily before breakfast.   . lipase/protease/amylase (CREON) 12000 units CPEP capsule Take 12,000 Units by mouth daily as needed (stomach problems).   . lovastatin (MEVACOR) 20 MG tablet Take 20 mg by mouth daily.   . methocarbamol (ROBAXIN) 500 MG tablet Take 1 tablet (500 mg total) by mouth 4 (four) times daily.  [name] omeprazole (PRILOSEC) 40 MG capsule Take 40 mg by mouth daily as needed (acid reflux).   . polyethylene glycol powder (GLYCOLAX/MIRALAX) powder Take 17 g by mouth at bedtime.   . potassium chloride (KLOR-CON) 8 MEQ tablet Take 1 tablet (8 mEq total) by mouth daily.  . traMADol (ULTRAM) 50 MG tablet Take 1 tablet (50 mg total) by mouth every 6 (six) hours as needed. (Patient not taking: Reported on 09/05/2017)   No facility-administered encounter medications on file as of 10/12/2017.      ONCOLOGIC FAMILY HISTORY:  Family History  Problem Relation Age of Onset  . CAD Mother 92  . Stroke Mother 32       hemorr CVA  . COPD Father 83  . Cancer Neg Hx        PHYSICAL EXAMINATION:  Vital Signs:   Vitals:   10/12/17 0915  BP: 130/66  Pulse: 78  Resp: 18  Temp: 98.3 F (36.8 C)  SpO2: 98%   Filed Weights   10/12/17 0915  Weight: 278 lb 6.4 oz (126.3 kg)   General: Well-nourished, well-appearing female, morbidly obese, in no acute distress   HEENT: Head is normocephalic.  Pupils equal and reactive to light. Conjunctivae clear without exudate.  Sclerae anicteric. Oral mucosa is pink, moist.  Oropharynx is pink without lesions or erythema.  Lymph: No cervical, supraclavicular, or infraclavicular lymphadenopathy noted on palpation.    Cardiovascular: Regular rate and rhythm.[name] Respiratory: Clear to auscultation bilaterally. Faint rales auscultated anteriorly. Chest expansion symmetric; breathing non-labored.  GI: Abdomen soft and round; non-tender, non-distended. Bowel sounds normoactive.  GU: Deferred.  Neuro: No focal deficits. Steady gait.  Psych: Mood and affect normal and appropriate for situation.  Extremities: both arms are heavy, left arm appears larger than right, hard to determine really if anything is swollen because of her morbid obesity, and the amount of weight she carries in her extremities MSK: No focal spinal tenderness to palpation.  Full range of motion in bilateral upper extremities Skin: Warm and dry.  LABORATORY DATA:  None for this visit.  DIAGNOSTIC IMAGING:  None for this visit.      ASSESSMENT AND PLAN:  Katelyn Jackson is a pleasant 71 y.o. female with Stage IA left breast invasive ductal carcinoma, ER+/PR+/HER2-, diagnosed in 02/2017, treated with lumpectomy, adjuvant radiation therapy, and anti-estrogen therapy with Exemestane beginning in 08/2017.  She presents to the Survivorship Clinic for our initial meeting and routine follow-up post-completion of treatment for breast cancer.    1. Stage IA left breast cancer:  Katelyn Jackson is continuing to recover from definitive treatment for breast cancer. She will follow-up with her medical oncologist, Dr. Burr Jackson in 4 months with history and physical exam per surveillance protocol.  She will continue her anti-estrogen therapy with Exemestane. Thus far, she is tolerating the Exemestane well, with minimal side effects. She was instructed to make Dr. Burr Jackson or myself aware if she begins to experience any worsening side effects of the medication and I could see her back in clinic to help manage those side effects, as needed. Today, a comprehensive survivorship care plan and treatment summary was reviewed with the patient today detailing her breast cancer  diagnosis, treatment course, potential late/long-term effects of treatment, appropriate follow-up care with recommendations for the future, and patient education resources.  A copy of this summary, along with a letter will be sent to the patient's primary care provider via mail/fax/In Basket message after today's visit.    2. Left arm swelling worse than right: Referred to lymphedema clinic.   3. Anterior rales: Will get chest xray to evaluate.    4. Bone health:  Given Katelyn Jackson age/history of breast cancer and her current treatment regimen including anti-estrogen therapy with Exemestane, she is at risk for bone demineralization.  Her last DEXA scan was 08/28/2017, which showed osteopenia with a T score of -1.4 in the left femur. In the meantime, she was encouraged to increase her consumption of foods rich in calcium, as well as increase her weight-bearing activities.  She was given education on specific activities to promote bone health.  5. Cancer screening:  Due to Katelyn Jackson's history and her age, she should receive screening for skin cancers, colon cancer, and gynecologic cancers.  The information and recommendations are listed on the patient's comprehensive care plan/treatment summary and were reviewed in detail with the patient.    6. Health maintenance and wellness promotion: Katelyn Jackson was encouraged to consume 5-7 servings of fruits and vegetables per day. We reviewed the "Nutrition Rainbow" handout, as well as the handout "Take Control of Your Health and Reduce Your Cancer Risk" from the [employer].  She was also encouraged to engage in moderate to vigorous exercise for 30 minutes per day most days of the week. We discussed the [employer] fitness program, which is designed for cancer survivors to help them become more physically fit after cancer treatments.  She was instructed to limit her alcohol consumption and continue to abstain from tobacco use.     7.  Support services/counseling: It is not uncommon for this period of the patient's cancer care trajectory to be  one of many emotions and stressors.  We discussed an opportunity for her to participate in the next session of [facility] ("Finding Your New Normal") support group series designed for patients after they have completed treatment.   Katelyn Jackson was encouraged to take advantage of our many other support services programs, support groups, and/or counseling in coping with her new life as a cancer survivor after completing anti-cancer treatment.  She was offered support today through active listening and expressive supportive counseling.  She was given information regarding our available services and encouraged to contact me with any questions or for help enrolling in any of our support group/programs.    Dispo:   -Return to cancer center for follow up with Dr. Burr Jackson in 04/2018 -Mammogram due in 01/2018 -Follow up with Dr. Barry Jackson in 11/2017 -She is welcome to return back to the Survivorship Clinic at any time; no additional follow-up needed at this time.  -Consider referral back to survivorship as a long-term survivor for continued surveillance  A total of (30) minutes of face-to-face time was spent with this patient with greater than 50% of that time in counseling and care-coordination.   Gardenia Phlegm, NP Survivorship Program [place] [facility] [phone]   Note: PRIMARY CARE PROVIDER Cassandria Anger, [place] [phone]  Addendum: received call from xray, and they are concerned for bilateral pulmonary edema, in her chest xray or lymphangitic spread of tumor.  I reviewed in detail with Dr. Burr Jackson.  She would like patient to undergo more aggressive diuresis, however her creatinine is 2.2.  I will send these results to her PCP, Dr. [name] to further evaluate and manage.  No chest CT at this time.  Will put in schedule message for patient to f/u with Dr. Burr Jackson in 4  weeks.  I called the interpreter line and we left a message on her voice mail to call us with the results.  [name]

## 2017-10-12 ENCOUNTER — Ambulatory Visit (HOSPITAL_COMMUNITY)
Admission: RE | Admit: 2017-10-12 | Discharge: 2017-10-12 | Disposition: A | Discharge status: Home / Self Care | Payer: Medicare HMO | Source: Ambulatory Visit | Attending: Adult Health | Admitting: Adult Health

## 2017-10-12 ENCOUNTER — Ambulatory Visit: Payer: Medicare HMO | Admitting: Hematology

## 2017-10-12 ENCOUNTER — Ambulatory Visit (HOSPITAL_BASED_OUTPATIENT_CLINIC_OR_DEPARTMENT_OTHER): Payer: Medicare HMO | Admitting: Adult Health

## 2017-10-12 ENCOUNTER — Telehealth: Payer: Self-pay | Admitting: Adult Health

## 2017-10-12 ENCOUNTER — Ambulatory Visit (HOSPITAL_BASED_OUTPATIENT_CLINIC_OR_DEPARTMENT_OTHER): Payer: Medicare HMO

## 2017-10-12 ENCOUNTER — Other Ambulatory Visit: Payer: Medicare HMO

## 2017-10-12 ENCOUNTER — Encounter: Payer: Self-pay | Admitting: Adult Health

## 2017-10-12 VITALS — BP 130/66 | HR 78 | Temp 98.3°F | Resp 18 | Ht 62.0 in | Wt 278.4 lb

## 2017-10-12 DIAGNOSIS — J810 Acute pulmonary edema: Secondary | ICD-10-CM | POA: Diagnosis not present

## 2017-10-12 DIAGNOSIS — C50412 Malignant neoplasm of upper-outer quadrant of left female breast: Secondary | ICD-10-CM

## 2017-10-12 DIAGNOSIS — I890 Lymphedema, not elsewhere classified: Secondary | ICD-10-CM | POA: Diagnosis not present

## 2017-10-12 DIAGNOSIS — D638 Anemia in other chronic diseases classified elsewhere: Secondary | ICD-10-CM | POA: Diagnosis not present

## 2017-10-12 DIAGNOSIS — Z170 Estrogen receptor positive status [ER+]: Secondary | ICD-10-CM

## 2017-10-12 DIAGNOSIS — R918 Other nonspecific abnormal finding of lung field: Secondary | ICD-10-CM | POA: Diagnosis not present

## 2017-10-12 DIAGNOSIS — R0989 Other specified symptoms and signs involving the circulatory and respiratory systems: Secondary | ICD-10-CM

## 2017-10-12 DIAGNOSIS — I517 Cardiomegaly: Secondary | ICD-10-CM | POA: Diagnosis not present

## 2017-10-12 DIAGNOSIS — M7989 Other specified soft tissue disorders: Secondary | ICD-10-CM | POA: Insufficient documentation

## 2017-10-12 LAB — CBC & DIFF AND RETIC
BASO%: 0.4 % (ref 0.0–2.0)
BASOS ABS: 0 10*3/uL (ref 0.0–0.1)
EOS ABS: 0.6 10*3/uL — AB (ref 0.0–0.5)
EOS%: 7.7 % — ABNORMAL HIGH (ref 0.0–7.0)
HEMATOCRIT: 33.7 % — AB (ref 34.8–46.6)
HEMOGLOBIN: 10.5 g/dL — AB (ref 11.6–15.9)
IMMATURE RETIC FRACT: 11.2 % — AB (ref 1.60–10.00)
LYMPH#: 1.2 10*3/uL (ref 0.9–3.3)
LYMPH%: 15.5 % (ref 14.0–49.7)
MCH: 28.5 pg (ref 25.1–34.0)
MCHC: 31.2 g/dL — ABNORMAL LOW (ref 31.5–36.0)
MCV: 91.6 fL (ref 79.5–101.0)
MONO#: 0.8 10*3/uL (ref 0.1–0.9)
MONO%: 11.2 % (ref 0.0–14.0)
NEUT#: 4.9 10*3/uL (ref 1.5–6.5)
NEUT%: 65.2 % (ref 38.4–76.8)
PLATELETS: 173 10*3/uL (ref 145–400)
RBC: 3.68 10*6/uL — AB (ref 3.70–5.45)
RDW: 14.1 % (ref 11.2–14.5)
RETIC CT ABS: 59.98 10*3/uL (ref 33.70–90.70)
Retic %: 1.63 % (ref 0.70–2.10)
WBC: 7.5 10*3/uL (ref 3.9–10.3)

## 2017-10-12 LAB — IRON AND TIBC
%SAT: 8 % — AB (ref 21–57)
Iron: 32 ug/dL — ABNORMAL LOW (ref 41–142)
TIBC: 385 ug/dL (ref 236–444)
UIBC: 353 ug/dL (ref 120–384)

## 2017-10-12 LAB — COMPREHENSIVE METABOLIC PANEL
ALT: 16 U/L (ref 0–55)
AST: 16 U/L (ref 5–34)
Albumin: 3.8 g/dL (ref 3.5–5.0)
Alkaline Phosphatase: 118 U/L (ref 40–150)
Anion Gap: 12 mEq/L — ABNORMAL HIGH (ref 3–11)
BUN: 54.8 mg/dL — AB (ref 7.0–26.0)
CHLORIDE: 103 meq/L (ref 98–109)
CO2: 24 meq/L (ref 22–29)
Calcium: 9.9 mg/dL (ref 8.4–10.4)
Creatinine: 2.2 mg/dL — ABNORMAL HIGH (ref 0.6–1.1)
EGFR: 22 mL/min/{1.73_m2} — AB (ref >60)
GLUCOSE: 97 mg/dL (ref 70–140)
POTASSIUM: 4.4 meq/L (ref 3.5–5.1)
SODIUM: 138 meq/L (ref 136–145)
Total Bilirubin: 0.68 mg/dL (ref 0.20–1.20)
Total Protein: 7.9 g/dL (ref 6.4–8.3)

## 2017-10-12 LAB — FERRITIN: FERRITIN: 33 ng/mL (ref 9–269)

## 2017-10-12 NOTE — Telephone Encounter (Signed)
Gave patient AVS and calendar of upcoming June appointments.  °

## 2017-10-13 DIAGNOSIS — I890 Lymphedema, not elsewhere classified: Secondary | ICD-10-CM | POA: Diagnosis not present

## 2017-10-14 ENCOUNTER — Telehealth: Payer: Self-pay | Admitting: Hematology

## 2017-10-14 DIAGNOSIS — I890 Lymphedema, not elsewhere classified: Secondary | ICD-10-CM | POA: Diagnosis not present

## 2017-10-14 NOTE — Telephone Encounter (Signed)
Called patient regarding appointment in january

## 2017-10-15 DIAGNOSIS — I890 Lymphedema, not elsewhere classified: Secondary | ICD-10-CM | POA: Diagnosis not present

## 2017-10-18 ENCOUNTER — Telehealth: Payer: Self-pay | Admitting: Emergency Medicine

## 2017-10-18 ENCOUNTER — Other Ambulatory Visit: Payer: Self-pay | Admitting: Hematology

## 2017-10-18 DIAGNOSIS — I890 Lymphedema, not elsewhere classified: Secondary | ICD-10-CM | POA: Diagnosis not present

## 2017-10-18 NOTE — Telephone Encounter (Signed)
Tried to reach pts son to discuss Dr.Fengs note. No answer and no VM set up. Will try again .

## 2017-10-19 ENCOUNTER — Telehealth: Payer: Self-pay | Admitting: Emergency Medicine

## 2017-10-19 DIAGNOSIS — I890 Lymphedema, not elsewhere classified: Secondary | ICD-10-CM | POA: Diagnosis not present

## 2017-10-19 NOTE — Telephone Encounter (Signed)
Spoke with [name], Pts son. He verbalized understanding of Dr.Fengs note for pt to start stating Iron 1-2 tabs daily and repeat lab draw on next visit.

## 2017-10-20 DIAGNOSIS — I890 Lymphedema, not elsewhere classified: Secondary | ICD-10-CM | POA: Diagnosis not present

## 2017-10-21 DIAGNOSIS — I890 Lymphedema, not elsewhere classified: Secondary | ICD-10-CM | POA: Diagnosis not present

## 2017-10-22 DIAGNOSIS — I890 Lymphedema, not elsewhere classified: Secondary | ICD-10-CM | POA: Diagnosis not present

## 2017-10-23 DIAGNOSIS — I890 Lymphedema, not elsewhere classified: Secondary | ICD-10-CM | POA: Diagnosis not present

## 2017-10-24 ENCOUNTER — Ambulatory Visit (INDEPENDENT_AMBULATORY_CARE_PROVIDER_SITE_OTHER): Payer: Medicare HMO | Admitting: General Practice

## 2017-10-24 DIAGNOSIS — Z7901 Long term (current) use of anticoagulants: Secondary | ICD-10-CM

## 2017-10-24 DIAGNOSIS — I482 Chronic atrial fibrillation, unspecified: Secondary | ICD-10-CM

## 2017-10-24 DIAGNOSIS — I890 Lymphedema, not elsewhere classified: Secondary | ICD-10-CM | POA: Diagnosis not present

## 2017-10-24 LAB — POCT INR: INR: 1.4

## 2017-10-24 NOTE — Patient Instructions (Addendum)
Pre visit review using our clinic review tool, if applicable. No additional management support is needed unless otherwise documented below in the visit note.  Please take 1 1/2 tablets today (12/18) and tomorrow (12/19) and then start taking 1 tablet daily.  Re-check in 2 weeks.

## 2017-10-25 DIAGNOSIS — I890 Lymphedema, not elsewhere classified: Secondary | ICD-10-CM | POA: Diagnosis not present

## 2017-10-26 ENCOUNTER — Ambulatory Visit (INDEPENDENT_AMBULATORY_CARE_PROVIDER_SITE_OTHER): Payer: Medicare HMO | Admitting: Internal Medicine

## 2017-10-26 ENCOUNTER — Encounter: Payer: Self-pay | Admitting: Internal Medicine

## 2017-10-26 DIAGNOSIS — N184 Chronic kidney disease, stage 4 (severe): Secondary | ICD-10-CM

## 2017-10-26 DIAGNOSIS — E876 Hypokalemia: Secondary | ICD-10-CM

## 2017-10-26 DIAGNOSIS — R062 Wheezing: Secondary | ICD-10-CM | POA: Diagnosis not present

## 2017-10-26 DIAGNOSIS — I890 Lymphedema, not elsewhere classified: Secondary | ICD-10-CM | POA: Diagnosis not present

## 2017-10-26 DIAGNOSIS — Z7901 Long term (current) use of anticoagulants: Secondary | ICD-10-CM

## 2017-10-26 DIAGNOSIS — I10 Essential (primary) hypertension: Secondary | ICD-10-CM

## 2017-10-26 DIAGNOSIS — I272 Pulmonary hypertension, unspecified: Secondary | ICD-10-CM | POA: Diagnosis not present

## 2017-10-26 NOTE — Progress Notes (Signed)
Subjective:  Patient ID: Katelyn Jackson, female    DOB: [birth year]  Age: 71 y.o. MRN: [id number]  CC: No chief complaint on file.   HPI Katelyn Jackson presents for an abn CXR a couple weeks ago. Pt states she was sick w/a cold at the time. She is much better now  The pt is seeing her nephrologist Dr Florene Glen regular...  "NP Mendel Ryder saw Katelyn Jackson. Due to the rales on exam, Mendel Ryder ordered a CXR which showed b/l pulmonary edema.Radiology says lymphangitic tumor spread is not excluded. She is on lasix. Cr also has increased from 1.4 three months ago to 2.2"  F/u CRF, morbid obesity. Loosing wt on diet  Outpatient Medications Prior to Visit  Medication Sig Dispense Refill  . carvedilol (COREG) 6.25 MG tablet Take 1 tablet (6.25 mg total) by mouth 2 (two) times daily with a meal. 60 tablet 11  . Cholecalciferol (VITAMIN D3) 2000 units capsule Take 1 capsule (2,000 Units total) by mouth daily. 100 capsule 3  . exemestane (AROMASIN) 25 MG tablet Take 1 tablet (25 mg total) by mouth daily after breakfast. 30 tablet 2  . febuxostat (ULORIC) 40 MG tablet Take 40 mg by mouth daily.    . furosemide (LASIX) 40 MG tablet Take 1 tablet (40 mg total) by mouth daily as needed. 90 tablet 3  . irbesartan-hydrochlorothiazide (AVALIDE) 150-12.5 MG tablet Take 1 tablet by mouth daily. 30 tablet 11  . levothyroxine (SYNTHROID, LEVOTHROID) 50 MCG tablet Take 50 mcg by mouth daily before breakfast.     . lipase/protease/amylase (CREON) 12000 units CPEP capsule Take 12,000 Units by mouth daily as needed (stomach problems).     . lovastatin (MEVACOR) 20 MG tablet Take 20 mg by mouth daily.     . methocarbamol (ROBAXIN) 500 MG tablet Take 1 tablet (500 mg total) by mouth 4 (four) times daily. 15 tablet 0  . omeprazole (PRILOSEC) 40 MG capsule Take 40 mg by mouth daily as needed (acid reflux).     . polyethylene glycol powder (GLYCOLAX/MIRALAX) powder Take 17 g by mouth at bedtime.     . potassium chloride  (KLOR-CON) 8 MEQ tablet Take 1 tablet (8 mEq total) by mouth daily. 90 tablet 3  . traMADol (ULTRAM) 50 MG tablet Take 1 tablet (50 mg total) by mouth every 6 (six) hours as needed. 28 tablet 0   No facility-administered medications prior to visit.     ROS Review of Systems  Constitutional: Negative for activity change, appetite change, chills, fatigue and unexpected weight change.  HENT: Negative for congestion, mouth sores and sinus pressure.   Eyes: Negative for visual disturbance.  Respiratory: Negative for cough and chest tightness.   Gastrointestinal: Negative for abdominal pain and nausea.  Genitourinary: Negative for difficulty urinating, frequency and vaginal pain.  Musculoskeletal: Negative for back pain and gait problem.  Skin: Negative for pallor and rash.  Neurological: Negative for dizziness, tremors, weakness, numbness and headaches.  Psychiatric/Behavioral: Negative for confusion and sleep disturbance.    Objective:  BP (!) 144/80   Pulse 70   Temp 97.9 F (36.6 C)   Wt 274 lb (124.3 kg)   LMP  (LMP Unknown)   SpO2 95%   BMI 50.12 kg/m   BP Readings from Last 3 Encounters:  10/26/17 (!) 144/80  10/12/17 130/66  09/05/17 (!) 144/90    Wt Readings from Last 3 Encounters:  10/26/17 274 lb (124.3 kg)  10/12/17 278 lb 6.4 oz (126.3 kg)  09/05/17 276  lb 6.4 oz (125.4 kg)    Physical Exam  Constitutional: She appears well-developed. No distress.  HENT:  Head: Normocephalic.  Right Ear: External ear normal.  Left Ear: External ear normal.  Nose: Nose normal.  Mouth/Throat: Oropharynx is clear and moist.  Eyes: Conjunctivae are normal. Pupils are equal, round, and reactive to light. Right eye exhibits no discharge. Left eye exhibits no discharge.  Neck: Normal range of motion. Neck supple. No JVD present. No tracheal deviation present. No thyromegaly present.  Cardiovascular: Normal rate, regular rhythm and normal heart sounds.  Pulmonary/Chest: No  stridor. No respiratory distress. She has no wheezes.  Abdominal: Soft. Bowel sounds are normal. She exhibits no distension and no mass. There is no tenderness. There is no rebound and no guarding.  Musculoskeletal: She exhibits no edema or tenderness.  Lymphadenopathy:    She has no cervical adenopathy.  Neurological: She displays normal reflexes. No cranial nerve deficit. She exhibits normal muscle tone. Coordination normal.  Skin: No rash noted. No erythema.  Psychiatric: She has a normal mood and affect. Her behavior is normal. Judgment and thought content normal.  obese No pitting edema; no JVD  Lab Results  Component Value Date   WBC 7.5 10/12/2017   HGB 10.5 (L) 10/12/2017   HCT 33.7 (L) 10/12/2017   PLT 173 10/12/2017   GLUCOSE 97 10/12/2017   ALT 16 10/12/2017   AST 16 10/12/2017   NA 138 10/12/2017   K 4.4 10/12/2017   CL 100 07/11/2017   CREATININE 2.2 (H) 10/12/2017   BUN 54.8 (H) 10/12/2017   CO2 24 10/12/2017   TSH 2.09 07/11/2017   INR 1.4 10/24/2017   HGBA1C 5.7 07/11/2017    Dg Chest 2 View  Result Date: 10/12/2017 CLINICAL DATA:  Left upper extremity edema. History of left-sided breast carcinoma EXAM: CHEST  2 VIEW COMPARISON:  May 29, 2017 FINDINGS: There is diffuse interstitial prominence bilaterally, somewhat more on the left than on the right. There is no airspace consolidation. Heart is mildly enlarged with pulmonary vascularity within normal limits. No adenopathy appreciable. No blastic or lytic bone lesions. There is degenerative change in the thoracic spine. There are surgical clips in the left axillary and lateral left breast regions. IMPRESSION: Diffuse interstitial prominence bilaterally, somewhat more prominent on the left than on the right. This finding may represent interstitial pulmonary edema. Lymphangitic spread of tumor must be a differential consideration in this circumstance. No associated airspace consolidation or volume loss. Mild cardiomegaly  and pulmonary vascular normal. No evident adenopathy. Postoperative change noted on the left. These results will be called to the ordering clinician or representative by the Radiologist Assistant, and communication documented in the PACS or zVision Dashboard. Electronically Signed   By: Lowella Grip III M.D.   On: 10/12/2017 11:57    Assessment & Plan:   There are no diagnoses linked to this encounter. I am having Katelyn Jackson maintain her levothyroxine, lovastatin, omeprazole, polyethylene glycol powder, febuxostat, lipase/protease/amylase, methocarbamol, Vitamin D3, carvedilol, exemestane, irbesartan-hydrochlorothiazide, furosemide, potassium chloride, and traMADol.  No orders of the defined types were placed in this encounter.    Follow-up: No Follow-up on file.  Walker Kehr, MD

## 2017-10-26 NOTE — Assessment & Plan Note (Addendum)
Avalide, Lasix Labs

## 2017-10-26 NOTE — Assessment & Plan Note (Signed)
Likely due to her recent URI, clinically sx's have resolved. I elected not to change meds due to her clinical improvement. She will call if problems. Abn CXR - CT pending

## 2017-10-26 NOTE — Assessment & Plan Note (Signed)
On KCl 

## 2017-10-26 NOTE — Assessment & Plan Note (Signed)
On Coumadin 

## 2017-10-26 NOTE — Assessment & Plan Note (Addendum)
Wt Readings from Last 3 Encounters:  10/26/17 274 lb (124.3 kg)  10/12/17 278 lb 6.4 oz (126.3 kg)  09/05/17 276 lb 6.4 oz (125.4 kg)   Better on diet

## 2017-10-26 NOTE — Assessment & Plan Note (Signed)
Lasix, Avalide, Coreg 

## 2017-10-26 NOTE — Assessment & Plan Note (Addendum)
No change Lasix, Avalide

## 2017-10-27 DIAGNOSIS — I890 Lymphedema, not elsewhere classified: Secondary | ICD-10-CM | POA: Diagnosis not present

## 2017-10-28 DIAGNOSIS — I890 Lymphedema, not elsewhere classified: Secondary | ICD-10-CM | POA: Diagnosis not present

## 2017-10-29 DIAGNOSIS — I890 Lymphedema, not elsewhere classified: Secondary | ICD-10-CM | POA: Diagnosis not present

## 2017-10-30 DIAGNOSIS — I890 Lymphedema, not elsewhere classified: Secondary | ICD-10-CM | POA: Diagnosis not present

## 2017-10-31 DIAGNOSIS — I890 Lymphedema, not elsewhere classified: Secondary | ICD-10-CM | POA: Diagnosis not present

## 2017-11-01 DIAGNOSIS — I890 Lymphedema, not elsewhere classified: Secondary | ICD-10-CM | POA: Diagnosis not present

## 2017-11-02 DIAGNOSIS — I890 Lymphedema, not elsewhere classified: Secondary | ICD-10-CM | POA: Diagnosis not present

## 2017-11-03 ENCOUNTER — Other Ambulatory Visit: Payer: Self-pay | Admitting: *Deleted

## 2017-11-03 DIAGNOSIS — I890 Lymphedema, not elsewhere classified: Secondary | ICD-10-CM | POA: Diagnosis not present

## 2017-11-03 MED ORDER — EXEMESTANE 25 MG PO TABS: 25.0000 mg | ORAL_TABLET | Freq: Every day | ORAL | 2 refills | Status: DC

## 2017-11-04 DIAGNOSIS — I890 Lymphedema, not elsewhere classified: Secondary | ICD-10-CM | POA: Diagnosis not present

## 2017-11-05 DIAGNOSIS — I890 Lymphedema, not elsewhere classified: Secondary | ICD-10-CM | POA: Diagnosis not present

## 2017-11-06 ENCOUNTER — Ambulatory Visit: Payer: Medicare HMO | Admitting: Internal Medicine

## 2017-11-06 ENCOUNTER — Other Ambulatory Visit: Payer: Self-pay | Admitting: Hematology

## 2017-11-06 DIAGNOSIS — I890 Lymphedema, not elsewhere classified: Secondary | ICD-10-CM | POA: Diagnosis not present

## 2017-11-08 ENCOUNTER — Encounter: Payer: Self-pay | Admitting: Physical Therapy

## 2017-11-08 ENCOUNTER — Ambulatory Visit: Payer: Medicare HMO | Attending: Adult Health | Admitting: Physical Therapy

## 2017-11-08 DIAGNOSIS — I890 Lymphedema, not elsewhere classified: Secondary | ICD-10-CM | POA: Diagnosis not present

## 2017-11-08 NOTE — Therapy (Addendum)
[place], [place], [address] Phone: [phone]   Fax:  [phone]  Physical Therapy Evaluation  Patient Details  Name: Katelyn Jackson MRN: [id number] Date of Birth: [birth year] Referring Provider: Thedore Mins   Encounter Date: 11/08/2017  PT End of Session - 11/08/17 1215    Visit Number  1    Number of Visits  13    Date for PT Re-Evaluation  12/06/17    PT Start Time  0933    PT Stop Time  1018    PT Time Calculation (min)  45 min    Activity Tolerance  Patient tolerated treatment well    Behavior During Therapy  [facility] for tasks assessed/performed       Past Medical History:  Diagnosis Date  . Arthritis   . Cancer [facility])    breast cancer  . Chronic atrial fibrillation ([place])   . Chronic kidney disease    sees Dr Florene Glen  . Cramps, muscle, general   . Dyspnea   . Dyspnea on exertion   . Dysrhythmia   . GERD (gastroesophageal reflux disease)   . Headache   . Hyperlipidemia   . Hypertension   . Hypothyroidism   . Lymphedema   . Moderate to severe pulmonary hypertension ([place])   . Obesity     Past Surgical History:  Procedure Laterality Date  . APPENDECTOMY    . BREAST BIOPSY Left 2018  . BREAST LUMPECTOMY WITH NEEDLE LOCALIZATION AND AXILLARY SENTINEL LYMPH NODE BX Left 04/04/2017   Procedure: BREAST LUMPECTOMY WITH NEEDLE LOCALIZATION x3 AND AXILLARY SENTINEL LYMPH NODE BX;  Surgeon: [name], MD;  Location: [place];  Service: General;  Laterality: Left;  . CATARACT EXTRACTION W/ INTRAOCULAR LENS  IMPLANT, BILATERAL Bilateral 2018  . COLONOSCOPY    . EYE SURGERY    . GLAUCOMA SURGERY Bilateral 2018  . RE-EXCISION OF BREAST CANCER,SUPERIOR MARGINS Left 04/26/2017   Procedure: RE-EXCISION OF LEFT BREAST CANCER;  Surgeon: [name], MD;  Location: [place];  Service: General;  Laterality: Left;    There were no vitals filed for this visit.   Subjective Assessment - 11/08/17  0942    Subjective  My arm has been swelling for months. I had a lumpectomy on Apr 04, 2017 and I had two lymph nodes removed. My leg was swollen before my breast cancer but it wasn't this bad. The doctor thinks that the chemotherapy I am taking made my swelling worse in my face and legs.     Pertinent History  stage 4 kidney failure, pulmonary hypertension, atrial fibrillation, left breast cancer with lumpectomy and SLNB on 04/04/17, pt has chemotherapy and radiation, exemestane taking daily    Patient Stated Goals  to manage my arm better and decrease swelling    Currently in Pain?  No/denies         [facility] PT Assessment - 11/08/17 0001      Assessment   Medical Diagnosis  left breast cancer    Referring Provider  Thedore Mins    Onset Date/Surgical Date  04/04/17    Hand Dominance  Right    Prior Therapy  PT for back pain      Precautions   Precautions  None      Restrictions   Weight Bearing Restrictions  No      Balance Screen   Has the patient fallen in the past 6 months  No    Has the patient had a  decrease in activity level because of a fear of falling?   Yes due to leg pain and swelling    Is the patient reluctant to leave their home because of a fear of falling?   No      Home Social worker  Private residence    Living Arrangements  Children with son    Available Help at Discharge  Family    Type of [place] to enter    Entrance Stairs-Number of Steps  [place] - 4 wheels      Prior Function   Level of Independence  Needs assistance with homemaking;Needs assistance with ADLs has an aide for several hours daily to help with bathingcook    Vocation  Retired    Leisure  exercise 2x/day lots of stretching, pt also tries to walk for exercise      Cognition   Overall Cognitive Status  Within Functional Limits for tasks assessed       Observation/Other Assessments   Observations  increased fibrosis of left forearm, decreased skin wrinkles compared to R      ROM / Strength   AROM / PROM / Strength  AROM      AROM   Overall AROM   Within functional limits for tasks performed limited right and end range but pt stretches daily        LYMPHEDEMA/ONCOLOGY QUESTIONNAIRE - 11/08/17 0958      Type   Cancer Type  left breast cancer      Surgeries   Lumpectomy Date  04/04/17    Sentinel Lymph Node Biopsy Date  04/04/17    Number Lymph Nodes Removed  2      Date Lymphedema/Swelling Started   Date  08/07/17      Treatment   Past Chemotherapy Treatment  Yes    Past Radiation Treatment  Yes      What other symptoms do you have   Are you Having Heaviness or Tightness  Yes    Are you having Pain  Yes    Are you having pitting edema  No    Is it Hard or Difficult finding clothes that fit  Yes    Do you have infections  No      Lymphedema Assessments   Lymphedema Assessments  Upper extremities      Right Upper Extremity Lymphedema   15 cm Proximal to Olecranon Process  45.5 cm    Olecranon Process  36.5 cm    15 cm Proximal to Ulnar Styloid Process  33.7 cm    Just Proximal to Ulnar Styloid Process  25 cm    Across Hand at [employer]  21.5 cm    At [place] of 2nd Digit  7.7 cm      Left Upper Extremity Lymphedema   15 cm Proximal to Olecranon Process  45 cm    Olecranon Process  35 cm    15 cm Proximal to Ulnar Styloid Process  33.4 cm    Just Proximal to Ulnar Styloid Process  25.3 cm    Across Hand at [employer]  22.2 cm    At [place] of 2nd Digit  7.5 cm          Objective measurements completed on examination: See above findings.  PT Education - 11/08/17 1227    Education provided  Yes    Education Details  anatomy and physiology of lymphatic system, contraindications to treatments    Person(s) Educated  Patient    Methods  Explanation    Comprehension  Verbalized  understanding          PT Long Term Goals - 11/08/17 1225      PT LONG TERM GOAL #1   Title  Pt will be able to independently verbalize lymphedema risk reduction practices    Time  4    Period  Weeks    Status  New    Target Date  12/06/17      PT LONG TERM GOAL #2   Title  Pt will be independent in self MLD technique for long term management of edema    Time  4    Period  Weeks    Status  New    Target Date  12/06/17      PT LONG TERM GOAL #3   Title  Pt will receive appropriate compression garments if deemed ok by her physicians for long term management of edema    Time  4    Period  Weeks    Status  New    Target Date  12/06/17      PT LONG TERM GOAL #4   Title  Pt will report a 50% improvement in LUE swelling as evidenced by increased skin wrinkles and decreased fibrotic texture    Time  4    Period  Weeks    Status  New    Target Date  12/06/17             Plan - 11/08/17 1218    Clinical Impression Statement  Pt presents to PT with LUE lymphedema following a left lumpectomy and SLNB for treatment of L breast cancer in May 2018. Pt also presents with bilateral LE lymphedema that has been present since prior to her breast cancer diagnosis. Pt has stage 4 kidney failure, atrial fibrillation and moderate to severe pulmonary hypertension. She states she received lymphedema services for her bilateral LEs three years ago and they applied compression bandages from foot to knee. Pt reports she became very short of breath and had to call EMS to go to the hospital. Based on her medical history and given that heart and kidney conditions can be contraindications for treatment of lymphedema, will refer to pt's physicians for medical clearance. Will create a plan of care that will be initiated once pt is cleared by her cardiologist, nephrologist and oncologist. Pending she receives clearance pt would benefit from skilled PT services to decrease LUE swelling to decrease risk of  infection.     History and Personal Factors relevant to plan of care:  stage 4 kidney disease, atrial fib, pulmonary hypertension, previous ER visit following compression bandaging    Clinical Presentation  Evolving    Clinical Presentation due to:  pt states her swelling is worsening    Clinical Decision Making  Moderate    Rehab Potential  Fair    Clinical Impairments Affecting Rehab Potential  multiple comorbidities    PT Frequency  3x / week    PT Duration  4 weeks    PT Treatment/Interventions  ADLs/Self Care Home Management;DME Instruction;Therapeutic exercise;Patient/family education;Orthotic Fit/Training;Manual techniques;Compression bandaging;Manual lymph drainage;Taping    PT Next Visit Plan  once pt receives medical clearance from all doctors can initiate MLD - see what doctors  allow for treatment- only focus on UE swelling    Consulted and Agree with Plan of Care  Patient       Patient will benefit from skilled therapeutic intervention in order to improve the following deficits and impairments:  Increased edema, Decreased knowledge of precautions  Visit Diagnosis: Lymphedema, not elsewhere classified - Plan: PT plan of care cert/re-cert     Problem List Patient Active Problem List   Diagnosis Date Noted  . Wheezing 10/26/2017  . Sciatica of left side 08/29/2017  . Long term (current) use of anticoagulants 08/02/2017  . Morbid obesity ([place]) 07/11/2017  . Hyperglycemia 07/11/2017  . Intractable low back pain 06/19/2017  . CRF (chronic renal failure), stage 4 (severe) ([place]) 06/19/2017  . Hypokalemia 06/19/2017  . Acute lower UTI 06/19/2017  . Anemia of chronic disease 02/16/2017  . Malignant neoplasm of upper-outer quadrant of left breast in female, estrogen receptor positive ([place]) 02/10/2017  . Essential hypertension 04/05/2016  . Dyslipidemia 04/05/2016  . Chronic atrial fibrillation ([place]) 04/05/2016  . Pulmonary hypertension ([place]) 04/05/2016  . Lymphedema  04/05/2016    Allyson Sabal [facility] 11/08/2017, 12:31 PM  [place], [place], [address] Phone: [phone]   Fax:  [phone]  Name: Katelyn Jackson MRN: [id number] Date of Birth: 1946/08/24   Manus Gunning, PT 11/08/17 12:32 PM  PHYSICAL THERAPY DISCHARGE SUMMARY  Visits from Start of Care: 1  Current functional level related to goals / functional outcomes: Due to pt's multiple comorbidities that are contraindications for receiving lymphedema treatment pt was not treated. Pt's referring doctor agreed with decision not to treat due to contraindications.  Remaining deficits: See above   Education / Equipment: Lymphedema education and education on contraindications Plan: Patient agrees to discharge.  Patient goals were not met. Patient is being discharged due to the physician's request.  ?????    [facility] [place], [place] 12/11/17 8:11 AM

## 2017-11-09 ENCOUNTER — Encounter: Payer: Self-pay | Admitting: Hematology

## 2017-11-09 ENCOUNTER — Inpatient Hospital Stay: Payer: Medicare HMO | Attending: Hematology | Admitting: Hematology

## 2017-11-09 ENCOUNTER — Telehealth: Payer: Self-pay | Admitting: Hematology

## 2017-11-09 ENCOUNTER — Ambulatory Visit (HOSPITAL_COMMUNITY)
Admission: RE | Admit: 2017-11-09 | Discharge: 2017-11-09 | Disposition: A | Discharge status: Home / Self Care | Payer: Medicare HMO | Source: Ambulatory Visit | Attending: Hematology | Admitting: Hematology

## 2017-11-09 VITALS — BP 154/80 | HR 104 | Temp 97.9°F | Resp 18 | Ht 62.0 in | Wt 279.7 lb

## 2017-11-09 DIAGNOSIS — D509 Iron deficiency anemia, unspecified: Secondary | ICD-10-CM

## 2017-11-09 DIAGNOSIS — N189 Chronic kidney disease, unspecified: Secondary | ICD-10-CM

## 2017-11-09 DIAGNOSIS — Z79811 Long term (current) use of aromatase inhibitors: Secondary | ICD-10-CM

## 2017-11-09 DIAGNOSIS — C50412 Malignant neoplasm of upper-outer quadrant of left female breast: Secondary | ICD-10-CM | POA: Insufficient documentation

## 2017-11-09 DIAGNOSIS — Z170 Estrogen receptor positive status [ER+]: Secondary | ICD-10-CM | POA: Diagnosis not present

## 2017-11-09 DIAGNOSIS — I890 Lymphedema, not elsewhere classified: Secondary | ICD-10-CM | POA: Diagnosis not present

## 2017-11-09 DIAGNOSIS — M859 Disorder of bone density and structure, unspecified: Secondary | ICD-10-CM | POA: Diagnosis not present

## 2017-11-09 DIAGNOSIS — R918 Other nonspecific abnormal finding of lung field: Secondary | ICD-10-CM | POA: Insufficient documentation

## 2017-11-09 MED ORDER — EXEMESTANE 25 MG PO TABS: 25.0000 mg | ORAL_TABLET | Freq: Every day | ORAL | 3 refills | Status: DC

## 2017-11-09 NOTE — Telephone Encounter (Signed)
Scheduled appt per 1/3 los - Gave patient AVS and calender per los.  

## 2017-11-09 NOTE — Progress Notes (Signed)
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 supplement well with prophylactic MiraLAX.   5. Osteoarthritis, arthralgia -She has required multiple series injection to her left hip, bilateral knees, she uses a  walker and wheelchair when she goes out. -We discussed his potential side effect of arthralgia from aromatase inhibitor, she will monitor closely. -I encouraged her to be physically active, and exercise.  6. Osteopenia  -She had a bone density scan in 2015 in [place], it was normal per patient. -I discussed the potential osteopenia and osteoporosis side effects from aromatase inhibitor, I encouraged her to take calcium and vitamin D. -08/2017 DEXA showed osteopenia at left femur neck with a T-Score of -1.4.    PLAN -CXR today  -Labs and f/u in 4 months  -Continue exemestane, refilled for her today    Orders Placed This Encounter  Procedures  . DG Chest 2 View    Standing Status:   Future    Standing Expiration Date:    11/09/2018    Order Specific Question:   Reason for Exam (SYMPTOM  OR DIAGNOSIS REQUIRED)    Answer:   f/u abnormal CXR on 10/12/2017    Order Specific Question:   Preferred imaging location?    Answer:   [facility]    Order Specific Question:   Radiology Contrast Protocol - do NOT remove file path    Answer:   [email or web address]    All questions were answered. The patient knows to call the clinic with any problems, questions or concerns. I spent 25 minutes counseling the patient face to face. The total time spent in the appointment was 30 minutes and more than 50% was on counseling.     Truitt Merle, MD 11/09/2017   This document serves as a record of services personally performed by Truitt Merle, MD. It was created on her behalf by Reola Mosher, a trained medical scribe. The creation of this record is based on the scribe's personal observations and the provider's statements to them. This document has been checked and approved by the attending provider.  I have reviewed the above documentation for accuracy and completeness, and I agree with the above.

## 2017-11-10 ENCOUNTER — Ambulatory Visit: Payer: Medicare HMO

## 2017-11-10 DIAGNOSIS — I890 Lymphedema, not elsewhere classified: Secondary | ICD-10-CM | POA: Diagnosis not present

## 2017-11-11 DIAGNOSIS — I890 Lymphedema, not elsewhere classified: Secondary | ICD-10-CM | POA: Diagnosis not present

## 2017-11-12 DIAGNOSIS — I890 Lymphedema, not elsewhere classified: Secondary | ICD-10-CM | POA: Diagnosis not present

## 2017-11-13 DIAGNOSIS — I890 Lymphedema, not elsewhere classified: Secondary | ICD-10-CM | POA: Diagnosis not present

## 2017-11-14 ENCOUNTER — Ambulatory Visit (INDEPENDENT_AMBULATORY_CARE_PROVIDER_SITE_OTHER): Payer: Medicare HMO | Admitting: General Practice

## 2017-11-14 ENCOUNTER — Telehealth: Payer: Self-pay

## 2017-11-14 DIAGNOSIS — I890 Lymphedema, not elsewhere classified: Secondary | ICD-10-CM | POA: Diagnosis not present

## 2017-11-14 DIAGNOSIS — Z7901 Long term (current) use of anticoagulants: Secondary | ICD-10-CM | POA: Diagnosis not present

## 2017-11-14 DIAGNOSIS — I482 Chronic atrial fibrillation, unspecified: Secondary | ICD-10-CM

## 2017-11-14 LAB — POCT INR: INR: 3.2

## 2017-11-14 NOTE — Telephone Encounter (Signed)
Ok to ref all x1 year Thx

## 2017-11-14 NOTE — Patient Instructions (Addendum)
Pre visit review using our clinic review tool, if applicable. No additional management support is needed unless otherwise documented below in the visit note.  Skip dose today and then continue to take 1 tablet daily.  Re-check in 4 weeks.

## 2017-11-14 NOTE — Telephone Encounter (Signed)
Patient saw Jenny Reichmann and was asking about refills. These medications have never been prescribed by you before, please advise. febuxostat (ULORIC) 40 MG tablet levothyroxine (SYNTHROID, LEVOTHROID) 50 MCG tablet lovastatin (MEVACOR) 20 MG tablet lipase/protease/amylase (CREON) 12000 units CPEP capsule methocarbamol (ROBAXIN) 500 MG tablet omeprazole (PRILOSEC) 40 MG capsule

## 2017-11-15 DIAGNOSIS — I890 Lymphedema, not elsewhere classified: Secondary | ICD-10-CM | POA: Diagnosis not present

## 2017-11-15 MED ORDER — PANCRELIPASE (LIP-PROT-AMYL) 12000-38000 UNITS PO CPEP: 12000.0000 [IU] | ORAL_CAPSULE | Freq: Every day | ORAL | 3 refills | Status: DC | PRN

## 2017-11-15 MED ORDER — METHOCARBAMOL 500 MG PO TABS: 500.0000 mg | ORAL_TABLET | Freq: Four times a day (QID) | ORAL | 11 refills | Status: DC

## 2017-11-15 MED ORDER — LOVASTATIN 20 MG PO TABS
20.0000 mg | ORAL_TABLET | Freq: Every day | ORAL | 3 refills | Status: DC
Start: 2017-11-15 — End: 2018-08-09

## 2017-11-15 MED ORDER — LEVOTHYROXINE SODIUM 50 MCG PO TABS: 50.0000 ug | ORAL_TABLET | Freq: Every day | ORAL | 3 refills | Status: DC

## 2017-11-15 MED ORDER — FEBUXOSTAT 40 MG PO TABS: 40.0000 mg | ORAL_TABLET | Freq: Every day | ORAL | 3 refills | Status: DC

## 2017-11-15 MED ORDER — OMEPRAZOLE 40 MG PO CPDR: 40.0000 mg | DELAYED_RELEASE_CAPSULE | Freq: Every day | ORAL | 3 refills | Status: DC | PRN

## 2017-11-15 NOTE — Telephone Encounter (Signed)
Rxs sent

## 2017-11-16 ENCOUNTER — Other Ambulatory Visit: Payer: Self-pay | Admitting: Internal Medicine

## 2017-11-16 DIAGNOSIS — I890 Lymphedema, not elsewhere classified: Secondary | ICD-10-CM | POA: Diagnosis not present

## 2017-11-17 DIAGNOSIS — I890 Lymphedema, not elsewhere classified: Secondary | ICD-10-CM | POA: Diagnosis not present

## 2017-11-18 DIAGNOSIS — I890 Lymphedema, not elsewhere classified: Secondary | ICD-10-CM | POA: Diagnosis not present

## 2017-11-19 DIAGNOSIS — I890 Lymphedema, not elsewhere classified: Secondary | ICD-10-CM | POA: Diagnosis not present

## 2017-11-20 DIAGNOSIS — I890 Lymphedema, not elsewhere classified: Secondary | ICD-10-CM | POA: Diagnosis not present

## 2017-11-21 ENCOUNTER — Telehealth: Payer: Self-pay | Admitting: *Deleted

## 2017-11-21 DIAGNOSIS — I890 Lymphedema, not elsewhere classified: Secondary | ICD-10-CM | POA: Diagnosis not present

## 2017-11-21 NOTE — Telephone Encounter (Signed)
TCT pt's son, Macky Lower regarding CXR results from 11/09/17. Spoke with son. Informed him that his mother's CXR was stable and that Dr. Burr Medico does not recommend further work up at this time.  Pt's son voiced understanding, states his mother is doing well at this time. No further questions or concerns.

## 2017-11-22 DIAGNOSIS — I890 Lymphedema, not elsewhere classified: Secondary | ICD-10-CM | POA: Diagnosis not present

## 2017-11-23 DIAGNOSIS — I890 Lymphedema, not elsewhere classified: Secondary | ICD-10-CM | POA: Diagnosis not present

## 2017-11-24 DIAGNOSIS — I890 Lymphedema, not elsewhere classified: Secondary | ICD-10-CM | POA: Diagnosis not present

## 2017-11-25 DIAGNOSIS — I890 Lymphedema, not elsewhere classified: Secondary | ICD-10-CM | POA: Diagnosis not present

## 2017-11-26 DIAGNOSIS — I890 Lymphedema, not elsewhere classified: Secondary | ICD-10-CM | POA: Diagnosis not present

## 2017-11-27 ENCOUNTER — Telehealth: Payer: Self-pay | Admitting: Physical Therapy

## 2017-11-27 DIAGNOSIS — I890 Lymphedema, not elsewhere classified: Secondary | ICD-10-CM | POA: Diagnosis not present

## 2017-11-27 NOTE — Telephone Encounter (Signed)
Explained to interpretor that due to pt's numerous contraindications to manual lymphatic drainage and compression bandaging, we will not be treating the patient for upper extremity swelling. Interpreter stated that she would inform the patient.  Allyson Sabal [place], [place] 11/27/17 4:59 PM

## 2017-11-28 DIAGNOSIS — I890 Lymphedema, not elsewhere classified: Secondary | ICD-10-CM | POA: Diagnosis not present

## 2017-11-29 DIAGNOSIS — I890 Lymphedema, not elsewhere classified: Secondary | ICD-10-CM | POA: Diagnosis not present

## 2017-11-30 DIAGNOSIS — I890 Lymphedema, not elsewhere classified: Secondary | ICD-10-CM | POA: Diagnosis not present

## 2017-12-01 DIAGNOSIS — I890 Lymphedema, not elsewhere classified: Secondary | ICD-10-CM | POA: Diagnosis not present

## 2017-12-02 DIAGNOSIS — I890 Lymphedema, not elsewhere classified: Secondary | ICD-10-CM | POA: Diagnosis not present

## 2017-12-03 DIAGNOSIS — I890 Lymphedema, not elsewhere classified: Secondary | ICD-10-CM | POA: Diagnosis not present

## 2017-12-04 DIAGNOSIS — I890 Lymphedema, not elsewhere classified: Secondary | ICD-10-CM | POA: Diagnosis not present

## 2017-12-05 DIAGNOSIS — I890 Lymphedema, not elsewhere classified: Secondary | ICD-10-CM | POA: Diagnosis not present

## 2017-12-06 DIAGNOSIS — I890 Lymphedema, not elsewhere classified: Secondary | ICD-10-CM | POA: Diagnosis not present

## 2017-12-07 DIAGNOSIS — I890 Lymphedema, not elsewhere classified: Secondary | ICD-10-CM | POA: Diagnosis not present

## 2017-12-08 DIAGNOSIS — I890 Lymphedema, not elsewhere classified: Secondary | ICD-10-CM | POA: Diagnosis not present

## 2017-12-09 DIAGNOSIS — I890 Lymphedema, not elsewhere classified: Secondary | ICD-10-CM | POA: Diagnosis not present

## 2017-12-10 DIAGNOSIS — I890 Lymphedema, not elsewhere classified: Secondary | ICD-10-CM | POA: Diagnosis not present

## 2017-12-11 DIAGNOSIS — I890 Lymphedema, not elsewhere classified: Secondary | ICD-10-CM | POA: Diagnosis not present

## 2017-12-12 ENCOUNTER — Ambulatory Visit (INDEPENDENT_AMBULATORY_CARE_PROVIDER_SITE_OTHER): Payer: Medicare HMO | Admitting: General Practice

## 2017-12-12 ENCOUNTER — Encounter: Payer: Self-pay | Admitting: Sports Medicine

## 2017-12-12 ENCOUNTER — Ambulatory Visit (INDEPENDENT_AMBULATORY_CARE_PROVIDER_SITE_OTHER): Payer: Medicare HMO

## 2017-12-12 ENCOUNTER — Ambulatory Visit (INDEPENDENT_AMBULATORY_CARE_PROVIDER_SITE_OTHER): Payer: Medicare HMO | Admitting: Sports Medicine

## 2017-12-12 ENCOUNTER — Other Ambulatory Visit: Payer: Self-pay | Admitting: Sports Medicine

## 2017-12-12 VITALS — BP 127/64 | HR 76

## 2017-12-12 DIAGNOSIS — I890 Lymphedema, not elsewhere classified: Secondary | ICD-10-CM | POA: Diagnosis not present

## 2017-12-12 DIAGNOSIS — M2141 Flat foot [pes planus] (acquired), right foot: Secondary | ICD-10-CM

## 2017-12-12 DIAGNOSIS — Z7901 Long term (current) use of anticoagulants: Secondary | ICD-10-CM | POA: Diagnosis not present

## 2017-12-12 DIAGNOSIS — M722 Plantar fascial fibromatosis: Secondary | ICD-10-CM

## 2017-12-12 DIAGNOSIS — I739 Peripheral vascular disease, unspecified: Secondary | ICD-10-CM

## 2017-12-12 DIAGNOSIS — M79671 Pain in right foot: Secondary | ICD-10-CM

## 2017-12-12 DIAGNOSIS — R52 Pain, unspecified: Secondary | ICD-10-CM

## 2017-12-12 DIAGNOSIS — M2142 Flat foot [pes planus] (acquired), left foot: Secondary | ICD-10-CM

## 2017-12-12 DIAGNOSIS — I482 Chronic atrial fibrillation, unspecified: Secondary | ICD-10-CM

## 2017-12-12 LAB — POCT INR: INR: 2.3

## 2017-12-12 NOTE — Patient Instructions (Signed)

## 2017-12-12 NOTE — Patient Instructions (Addendum)
Pre visit review using our clinic review tool, if applicable. No additional management support is needed unless otherwise documented below in the visit note.  Continue to take 1 tablet daily.  Re-check in 4 weeks.  

## 2017-12-12 NOTE — Progress Notes (Signed)
Subjective: Katelyn Jackson is a 72 y.o. female patient presents to office with complaint of moderate heel pain on the right. Patient admits to post static dyskinesia for 2 weeks in duration. Patient has treated this problem with garlic wrap that is a little better. Denies any other pedal complaints.   Patient has relative interperting since [name] accent is hard to understand   Review of Systems  Musculoskeletal:       Heel pain on right  All other systems reviewed and are negative.   Patient Active Problem List   Diagnosis Date Noted  . Wheezing 10/26/2017  . Sciatica of left side 08/29/2017  . Long term (current) use of anticoagulants 08/02/2017  . Morbid obesity ([place]) 07/11/2017  . Hyperglycemia 07/11/2017  . Intractable low back pain 06/19/2017  . CRF (chronic renal failure), stage 4 (severe) ([place]) 06/19/2017  . Hypokalemia 06/19/2017  . Acute lower UTI 06/19/2017  . Anemia of chronic disease 02/16/2017  . Malignant neoplasm of upper-outer quadrant of left breast in female, estrogen receptor positive ([place]) 02/10/2017  . Essential hypertension 04/05/2016  . Dyslipidemia 04/05/2016  . Chronic atrial fibrillation ([place]) 04/05/2016  . Pulmonary hypertension ([place]) 04/05/2016  . Lymphedema 04/05/2016    Current Outpatient Medications on File Prior to Visit  Medication Sig Dispense Refill  . carvedilol (COREG) 6.25 MG tablet Take 1 tablet (6.25 mg total) by mouth 2 (two) times daily with a meal. 60 tablet 11  . Cholecalciferol (VITAMIN D3) 2000 units capsule Take 1 capsule (2,000 Units total) by mouth daily. 100 capsule 3  . exemestane (AROMASIN) 25 MG tablet Take 1 tablet (25 mg total) by mouth daily after breakfast. 90 tablet 3  . febuxostat (ULORIC) 40 MG tablet Take 1 tablet (40 mg total) by mouth daily. 90 tablet 3  . furosemide (LASIX) 40 MG tablet Take 1 tablet (40 mg total) by mouth daily as needed. 90 tablet 3  . irbesartan-hydrochlorothiazide (AVALIDE) 150-12.5 MG  tablet Take 1 tablet by mouth daily. 30 tablet 11  . levothyroxine (SYNTHROID, LEVOTHROID) 50 MCG tablet Take 1 tablet (50 mcg total) by mouth daily before breakfast. 90 tablet 3  . lipase/protease/amylase (CREON) 12000 units CPEP capsule Take 1 capsule (12,000 Units total) by mouth daily as needed (stomach problems). 90 capsule 3  . lovastatin (MEVACOR) 20 MG tablet Take 1 tablet (20 mg total) by mouth daily. 90 tablet 3  . methocarbamol (ROBAXIN) 500 MG tablet Take 1 tablet (500 mg total) by mouth 4 (four) times daily. 120 tablet 11  . omeprazole (PRILOSEC) 40 MG capsule Take 1 capsule (40 mg total) by mouth daily as needed (acid reflux). 90 capsule 3  . polyethylene glycol powder (GLYCOLAX/MIRALAX) powder mix 1 capful (17 grams) IN 8 ounces OF liquid EVERY DAY 1581 g 3  . potassium chloride (KLOR-CON) 8 MEQ tablet Take 1 tablet (8 mEq total) by mouth daily. 90 tablet 3  . traMADol (ULTRAM) 50 MG tablet Take 1 tablet (50 mg total) by mouth every 6 (six) hours as needed. 28 tablet 0  . VOLTAREN 1 % GEL apply 4 grams 4 TIMES DAILY AS NEEDED FOR 30 DAYS  5   No current facility-administered medications on file prior to visit.     Allergies  Allergen Reactions  . Chlorhexidine Gluconate Hives  . Penicillins Itching    Has patient had a PCN reaction causing immediate rash, facial/tongue/throat swelling, SOB or lightheadedness with hypotension: no Has patient had a PCN reaction causing severe rash involving mucus  membranes or skin necrosis: No Has patient had a PCN reaction that required hospitalization: No Has patient had a PCN reaction occurring within the last 10 years: No If all of the above answers are "NO", then may proceed with Cephalosporin use.    Objective: Physical Exam General: The patient is alert and oriented x3 in no acute distress.  Dermatology: Skin is warm, dry and supple bilateral lower extremities. Nails 1-10 are short and discolored. There is no erythema, edema, no  eccymosis, no open lesions present. Integument is otherwise unremarkable.  Vascular: Dorsalis Pedis pulse 1/4 and Posterior Tibial pulse are 0/4 bilateral due to chronic changes from obesity and lymphedema. Capillary fill time is immediate to all digits.  Neurological: Grossly intact to light touch with an achilles reflex of +2/5 and a  negative Tinel's sign bilateral.  Musculoskeletal: Mild enderness to palpation at the medial calcaneal tubercale and through the insertion of the plantar fascia on the right foot. No pain with compression of calcaneus bilateral. No pain with tuning fork to calcaneus bilateral. No pain with calf compression bilateral. There is decreased Ankle joint range of motion bilateral. All other joints range of motion within normal limits bilateral. Pes planus. Strength 5/5 in all groups bilateral.   Xray, Right foot:  Normal osseous mineralization. Joint spaces preserved. No fracture/dislocation/boney destruction. Calcaneal spur present with mild thickening of plantar fascia. No other soft tissue abnormalities or radiopaque foreign bodies.   Assessment and Plan: Problem List Items Addressed This Visit      Other   Lymphedema   Morbid obesity ([place])    Other Visit Diagnoses    Plantar fasciitis    -  Primary   Pain       Relevant Orders   DG Foot Complete Right   Pain of right heel       PVD (peripheral vascular disease) (HCC)       Pes planus of both feet          -Complete examination performed.  -Xrays reviewed -Discussed with patient in detail the condition of plantar fasciitis, how this occurs and general treatment options. Explained both conservative and surgical treatments.  -Patient declined injection or medication -May continue with home remedies  -Dispensed heel padding  -Recommended good supportive shoes and advised use of OTC insert or replacing house shoes -Explained and dispensed to patient daily stretching exercises. -Recommend patient to ice  affected area 1-2x daily. -Patient to return to office as needed for follow up or sooner if problems or questions arise.  Landis Martins, DPM

## 2017-12-13 DIAGNOSIS — I890 Lymphedema, not elsewhere classified: Secondary | ICD-10-CM | POA: Diagnosis not present

## 2017-12-14 DIAGNOSIS — I890 Lymphedema, not elsewhere classified: Secondary | ICD-10-CM | POA: Diagnosis not present

## 2017-12-15 DIAGNOSIS — I890 Lymphedema, not elsewhere classified: Secondary | ICD-10-CM | POA: Diagnosis not present

## 2017-12-16 DIAGNOSIS — I890 Lymphedema, not elsewhere classified: Secondary | ICD-10-CM | POA: Diagnosis not present

## 2017-12-17 DIAGNOSIS — I890 Lymphedema, not elsewhere classified: Secondary | ICD-10-CM | POA: Diagnosis not present

## 2017-12-18 DIAGNOSIS — I890 Lymphedema, not elsewhere classified: Secondary | ICD-10-CM | POA: Diagnosis not present

## 2017-12-19 DIAGNOSIS — I890 Lymphedema, not elsewhere classified: Secondary | ICD-10-CM | POA: Diagnosis not present

## 2017-12-20 DIAGNOSIS — I890 Lymphedema, not elsewhere classified: Secondary | ICD-10-CM | POA: Diagnosis not present

## 2017-12-21 DIAGNOSIS — I890 Lymphedema, not elsewhere classified: Secondary | ICD-10-CM | POA: Diagnosis not present

## 2017-12-22 DIAGNOSIS — I890 Lymphedema, not elsewhere classified: Secondary | ICD-10-CM | POA: Diagnosis not present

## 2017-12-23 DIAGNOSIS — I890 Lymphedema, not elsewhere classified: Secondary | ICD-10-CM | POA: Diagnosis not present

## 2017-12-24 DIAGNOSIS — I890 Lymphedema, not elsewhere classified: Secondary | ICD-10-CM | POA: Diagnosis not present

## 2017-12-25 DIAGNOSIS — I890 Lymphedema, not elsewhere classified: Secondary | ICD-10-CM | POA: Diagnosis not present

## 2017-12-26 DIAGNOSIS — I890 Lymphedema, not elsewhere classified: Secondary | ICD-10-CM | POA: Diagnosis not present

## 2017-12-27 DIAGNOSIS — I890 Lymphedema, not elsewhere classified: Secondary | ICD-10-CM | POA: Diagnosis not present

## 2017-12-28 DIAGNOSIS — I890 Lymphedema, not elsewhere classified: Secondary | ICD-10-CM | POA: Diagnosis not present

## 2017-12-29 DIAGNOSIS — I890 Lymphedema, not elsewhere classified: Secondary | ICD-10-CM | POA: Diagnosis not present

## 2017-12-30 DIAGNOSIS — I890 Lymphedema, not elsewhere classified: Secondary | ICD-10-CM | POA: Diagnosis not present

## 2017-12-31 DIAGNOSIS — I890 Lymphedema, not elsewhere classified: Secondary | ICD-10-CM | POA: Diagnosis not present

## 2018-01-01 DIAGNOSIS — I890 Lymphedema, not elsewhere classified: Secondary | ICD-10-CM | POA: Diagnosis not present

## 2018-01-02 DIAGNOSIS — H401134 Primary open-angle glaucoma, bilateral, indeterminate stage: Secondary | ICD-10-CM | POA: Diagnosis not present

## 2018-01-02 DIAGNOSIS — H25813 Combined forms of age-related cataract, bilateral: Secondary | ICD-10-CM | POA: Diagnosis not present

## 2018-01-02 DIAGNOSIS — Z961 Presence of intraocular lens: Secondary | ICD-10-CM | POA: Diagnosis not present

## 2018-01-02 DIAGNOSIS — I890 Lymphedema, not elsewhere classified: Secondary | ICD-10-CM | POA: Diagnosis not present

## 2018-01-03 DIAGNOSIS — I890 Lymphedema, not elsewhere classified: Secondary | ICD-10-CM | POA: Diagnosis not present

## 2018-01-04 DIAGNOSIS — I890 Lymphedema, not elsewhere classified: Secondary | ICD-10-CM | POA: Diagnosis not present

## 2018-01-05 ENCOUNTER — Ambulatory Visit (INDEPENDENT_AMBULATORY_CARE_PROVIDER_SITE_OTHER): Payer: Medicare HMO | Admitting: Internal Medicine

## 2018-01-05 ENCOUNTER — Encounter: Payer: Self-pay | Admitting: Internal Medicine

## 2018-01-05 ENCOUNTER — Other Ambulatory Visit (INDEPENDENT_AMBULATORY_CARE_PROVIDER_SITE_OTHER): Payer: Medicare HMO

## 2018-01-05 DIAGNOSIS — Z7901 Long term (current) use of anticoagulants: Secondary | ICD-10-CM

## 2018-01-05 DIAGNOSIS — I272 Pulmonary hypertension, unspecified: Secondary | ICD-10-CM

## 2018-01-05 DIAGNOSIS — N184 Chronic kidney disease, stage 4 (severe): Secondary | ICD-10-CM

## 2018-01-05 DIAGNOSIS — I482 Chronic atrial fibrillation, unspecified: Secondary | ICD-10-CM

## 2018-01-05 DIAGNOSIS — I890 Lymphedema, not elsewhere classified: Secondary | ICD-10-CM | POA: Diagnosis not present

## 2018-01-05 DIAGNOSIS — R739 Hyperglycemia, unspecified: Secondary | ICD-10-CM

## 2018-01-05 DIAGNOSIS — I10 Essential (primary) hypertension: Secondary | ICD-10-CM

## 2018-01-05 DIAGNOSIS — N179 Acute kidney failure, unspecified: Secondary | ICD-10-CM

## 2018-01-05 LAB — BASIC METABOLIC PANEL
BUN: 47 mg/dL — ABNORMAL HIGH (ref 6–23)
CALCIUM: 10.4 mg/dL (ref 8.4–10.5)
CO2: 28 mEq/L (ref 19–32)
Chloride: 103 mEq/L (ref 96–112)
Creatinine, Ser: 2.21 mg/dL — ABNORMAL HIGH (ref 0.40–1.20)
GFR: 23.19 mL/min — AB (ref >60.00)
Glucose, Bld: 97 mg/dL (ref 70–99)
Potassium: 4.4 mEq/L (ref 3.5–5.1)
SODIUM: 139 meq/L (ref 135–145)

## 2018-01-05 LAB — TSH: TSH: 6.26 u[IU]/mL — AB (ref 0.35–4.50)

## 2018-01-05 LAB — HEPATIC FUNCTION PANEL
ALT: 13 U/L (ref 0–35)
AST: 15 U/L (ref 0–37)
Albumin: 3.8 g/dL (ref 3.5–5.2)
Alkaline Phosphatase: 146 U/L — ABNORMAL HIGH (ref 39–117)
BILIRUBIN DIRECT: 0.3 mg/dL (ref 0.0–0.3)
BILIRUBIN TOTAL: 0.8 mg/dL (ref 0.2–1.2)
Total Protein: 7.8 g/dL (ref 6.0–8.3)

## 2018-01-05 LAB — HEMOGLOBIN A1C: Hgb A1c MFr Bld: 5.5 % (ref 4.6–6.5)

## 2018-01-05 MED ORDER — POLYETHYLENE GLYCOL 3350 17 GM/SCOOP PO POWD: ORAL | 3 refills | Status: DC

## 2018-01-05 MED ORDER — VOLTAREN 1 % TD GEL: TRANSDERMAL | 5 refills | Status: DC

## 2018-01-05 NOTE — Assessment & Plan Note (Signed)
Labs

## 2018-01-05 NOTE — Progress Notes (Signed)
Subjective:  Patient ID: Katelyn Jackson, female    DOB: 09/01/1946  Age: 72 y.o. MRN: [id number]  CC: No chief complaint on file.   HPI Katelyn Jackson presents for wt gain, edema, A fib, anticoagulation f/u.  Complains of fluid retention especially in her face. Her eyes look like they are shut in am - "water". Follow-up hypertension, chronic renal insufficiency, obesity   Outpatient Medications Prior to Visit  Medication Sig Dispense Refill  . carvedilol (COREG) 6.25 MG tablet Take 1 tablet (6.25 mg total) by mouth 2 (two) times daily with a meal. 60 tablet 11  . Cholecalciferol (VITAMIN D3) 2000 units capsule Take 1 capsule (2,000 Units total) by mouth daily. 100 capsule 3  . exemestane (AROMASIN) 25 MG tablet Take 1 tablet (25 mg total) by mouth daily after breakfast. 90 tablet 3  . febuxostat (ULORIC) 40 MG tablet Take 1 tablet (40 mg total) by mouth daily. 90 tablet 3  . furosemide (LASIX) 40 MG tablet Take 1 tablet (40 mg total) by mouth daily as needed. 90 tablet 3  . irbesartan-hydrochlorothiazide (AVALIDE) 150-12.5 MG tablet Take 1 tablet by mouth daily. 30 tablet 11  . levothyroxine (SYNTHROID, LEVOTHROID) 50 MCG tablet Take 1 tablet (50 mcg total) by mouth daily before breakfast. 90 tablet 3  . lipase/protease/amylase (CREON) 12000 units CPEP capsule Take 1 capsule (12,000 Units total) by mouth daily as needed (stomach problems). 90 capsule 3  . lovastatin (MEVACOR) 20 MG tablet Take 1 tablet (20 mg total) by mouth daily. 90 tablet 3  . methocarbamol (ROBAXIN) 500 MG tablet Take 1 tablet (500 mg total) by mouth 4 (four) times daily. 120 tablet 11  . omeprazole (PRILOSEC) 40 MG capsule Take 1 capsule (40 mg total) by mouth daily as needed (acid reflux). 90 capsule 3  . polyethylene glycol powder (GLYCOLAX/MIRALAX) powder mix 1 capful (17 grams) IN 8 ounces OF liquid EVERY DAY 1581 g 3  . potassium chloride (KLOR-CON) 8 MEQ tablet Take 1 tablet (8 mEq total) by mouth daily.  90 tablet 3  . traMADol (ULTRAM) 50 MG tablet Take 1 tablet (50 mg total) by mouth every 6 (six) hours as needed. 28 tablet 0  . VOLTAREN 1 % GEL apply 4 grams 4 TIMES DAILY AS NEEDED FOR 30 DAYS  5   No facility-administered medications prior to visit.     ROS Review of Systems  Constitutional: Positive for unexpected weight change. Negative for activity change, appetite change, chills and fatigue.  HENT: Negative for congestion, mouth sores and sinus pressure.   Eyes: Negative for visual disturbance.  Respiratory: Negative for cough and chest tightness.   Gastrointestinal: Negative for abdominal pain and nausea.  Genitourinary: Negative for difficulty urinating, frequency and vaginal pain.  Musculoskeletal: Positive for back pain and gait problem.  Skin: Negative for pallor and rash.  Neurological: Negative for dizziness, tremors, weakness, numbness and headaches.  Psychiatric/Behavioral: Negative for confusion and sleep disturbance.    Objective:  BP 124/76 (BP Location: Right Arm, Patient Position: Sitting, Cuff Size: Large)   Pulse 68   Temp 98.4 F (36.9 C) (Oral)   Ht 5\' 2"  (1.575 m)   Wt 287 lb (130.2 kg)   LMP  (LMP Unknown)   SpO2 98%   BMI 52.49 kg/m   BP Readings from Last 3 Encounters:  01/05/18 124/76  12/12/17 127/64  11/09/17 (!) 154/80    Wt Readings from Last 3 Encounters:  01/05/18 287 lb (130.2 kg)  11/09/17 279  lb 11.2 oz (126.9 kg)  10/26/17 274 lb (124.3 kg)    Physical Exam  Constitutional: She appears well-developed. No distress.  HENT:  Head: Normocephalic.  Right Ear: External ear normal.  Left Ear: External ear normal.  Nose: Nose normal.  Mouth/Throat: Oropharynx is clear and moist.  Eyes: Conjunctivae are normal. Pupils are equal, round, and reactive to light. Right eye exhibits no discharge. Left eye exhibits no discharge.  Neck: Normal range of motion. Neck supple. No JVD present. No tracheal deviation present. No thyromegaly  present.  Cardiovascular: Normal rate, regular rhythm and normal heart sounds.  Pulmonary/Chest: No stridor. No respiratory distress. She has no wheezes.  Abdominal: Soft. Bowel sounds are normal. She exhibits no distension and no mass. There is no tenderness. There is no rebound and no guarding.  Musculoskeletal: She exhibits edema. She exhibits no tenderness.  Lymphadenopathy:    She has no cervical adenopathy.  Neurological: She displays normal reflexes. No cranial nerve deficit. She exhibits normal muscle tone. Coordination normal.  Skin: No rash noted. No erythema.  Psychiatric: She has a normal mood and affect. Her behavior is normal. Judgment and thought content normal.  non-pitting edema om LEs  Lab Results  Component Value Date   WBC 7.5 10/12/2017   HGB 10.5 (L) 10/12/2017   HCT 33.7 (L) 10/12/2017   PLT 173 10/12/2017   GLUCOSE 97 10/12/2017   ALT 16 10/12/2017   AST 16 10/12/2017   NA 138 10/12/2017   K 4.4 10/12/2017   CL 100 07/11/2017   CREATININE 2.2 (H) 10/12/2017   BUN 54.8 (H) 10/12/2017   CO2 24 10/12/2017   TSH 2.09 07/11/2017   INR 2.3 12/12/2017   HGBA1C 5.7 07/11/2017    Dg Chest 2 View  Result Date: 11/09/2017 CLINICAL DATA:  Follow-up abnormal chest x-ray EXAM: CHEST  2 VIEW COMPARISON:  10/12/2017 FINDINGS: Mild interstitial prominence throughout the lungs again noted, stable. Heart is borderline in size. No effusions. No acute bony abnormality. IMPRESSION: Stable mild interstitial prominence within the lungs, left greater than right. No significant change. Electronically Signed   By: Rolm Baptise M.D.   On: 11/09/2017 11:35    Assessment & Plan:   There are no diagnoses linked to this encounter. I am having Katelyn Jackson maintain her Vitamin D3, carvedilol, irbesartan-hydrochlorothiazide, furosemide, potassium chloride, traMADol, VOLTAREN, exemestane, febuxostat, levothyroxine, lovastatin, lipase/protease/amylase, methocarbamol, omeprazole, and  polyethylene glycol powder.  No orders of the defined types were placed in this encounter.    Follow-up: No Follow-up on file.  Katelyn Kehr, MD

## 2018-01-05 NOTE — Assessment & Plan Note (Addendum)
Monitoring labs.  Fluid balance is difficult to manage.  There is some element of lymphedema.  The patient states she maintains on a low-salt diet.  We discussed her diet and her calorie intake is reasonable.  It is a difficult situation.

## 2018-01-05 NOTE — Assessment & Plan Note (Signed)
Lasix, Avalide

## 2018-01-05 NOTE — Assessment & Plan Note (Signed)
Coumadin 

## 2018-01-06 DIAGNOSIS — I890 Lymphedema, not elsewhere classified: Secondary | ICD-10-CM | POA: Diagnosis not present

## 2018-01-07 ENCOUNTER — Encounter: Payer: Self-pay | Admitting: Internal Medicine

## 2018-01-07 DIAGNOSIS — I890 Lymphedema, not elsewhere classified: Secondary | ICD-10-CM | POA: Diagnosis not present

## 2018-01-07 NOTE — Assessment & Plan Note (Signed)
Lasix, Avalide, Coreg 

## 2018-01-07 NOTE — Assessment & Plan Note (Signed)
The patient states she maintains on a low-salt diet.  We discussed her diet and her calorie intake is reasonable.  It is a difficult situation.

## 2018-01-07 NOTE — Assessment & Plan Note (Signed)
On Coumadin 

## 2018-01-08 DIAGNOSIS — I890 Lymphedema, not elsewhere classified: Secondary | ICD-10-CM | POA: Diagnosis not present

## 2018-01-09 ENCOUNTER — Other Ambulatory Visit: Payer: Self-pay

## 2018-01-09 ENCOUNTER — Ambulatory Visit (INDEPENDENT_AMBULATORY_CARE_PROVIDER_SITE_OTHER): Payer: Medicare HMO | Admitting: General Practice

## 2018-01-09 DIAGNOSIS — Z7901 Long term (current) use of anticoagulants: Secondary | ICD-10-CM

## 2018-01-09 DIAGNOSIS — I890 Lymphedema, not elsewhere classified: Secondary | ICD-10-CM | POA: Diagnosis not present

## 2018-01-09 DIAGNOSIS — I482 Chronic atrial fibrillation, unspecified: Secondary | ICD-10-CM

## 2018-01-09 LAB — POCT INR: INR: 4.1

## 2018-01-09 MED ORDER — VOLTAREN 1 % TD GEL: TRANSDERMAL | 5 refills | Status: DC

## 2018-01-09 NOTE — Patient Instructions (Signed)
Pre visit review using our clinic review tool, if applicable. No additional management support is needed unless otherwise documented below in the visit note.  Do not take coumadin today or tomorrow.  On Thursday continue to take 1 tablet daily.  Re-check in 2 weeks.

## 2018-01-10 DIAGNOSIS — I890 Lymphedema, not elsewhere classified: Secondary | ICD-10-CM | POA: Diagnosis not present

## 2018-01-11 DIAGNOSIS — I890 Lymphedema, not elsewhere classified: Secondary | ICD-10-CM | POA: Diagnosis not present

## 2018-01-12 DIAGNOSIS — I890 Lymphedema, not elsewhere classified: Secondary | ICD-10-CM | POA: Diagnosis not present

## 2018-01-13 DIAGNOSIS — I890 Lymphedema, not elsewhere classified: Secondary | ICD-10-CM | POA: Diagnosis not present

## 2018-01-14 DIAGNOSIS — I890 Lymphedema, not elsewhere classified: Secondary | ICD-10-CM | POA: Diagnosis not present

## 2018-01-15 ENCOUNTER — Telehealth: Payer: Self-pay | Admitting: Internal Medicine

## 2018-01-15 DIAGNOSIS — M25561 Pain in right knee: Secondary | ICD-10-CM

## 2018-01-15 DIAGNOSIS — I890 Lymphedema, not elsewhere classified: Secondary | ICD-10-CM | POA: Diagnosis not present

## 2018-01-15 DIAGNOSIS — M25562 Pain in left knee: Principal | ICD-10-CM

## 2018-01-15 NOTE — Telephone Encounter (Signed)
Referral ordered

## 2018-01-15 NOTE — Telephone Encounter (Signed)
Patient's son came into the office today stating that the patient is scheduled at [facility] for injections in both knees. She is needing a referral for insurance coverage. Can this be put in?

## 2018-01-15 NOTE — Telephone Encounter (Signed)
Routing to dr burns, please advise, I will call patient back, thanks

## 2018-01-16 DIAGNOSIS — I890 Lymphedema, not elsewhere classified: Secondary | ICD-10-CM | POA: Diagnosis not present

## 2018-01-17 DIAGNOSIS — I890 Lymphedema, not elsewhere classified: Secondary | ICD-10-CM | POA: Diagnosis not present

## 2018-01-18 DIAGNOSIS — M170 Bilateral primary osteoarthritis of knee: Secondary | ICD-10-CM | POA: Diagnosis not present

## 2018-01-18 DIAGNOSIS — I890 Lymphedema, not elsewhere classified: Secondary | ICD-10-CM | POA: Diagnosis not present

## 2018-01-19 DIAGNOSIS — I890 Lymphedema, not elsewhere classified: Secondary | ICD-10-CM | POA: Diagnosis not present

## 2018-01-20 DIAGNOSIS — I890 Lymphedema, not elsewhere classified: Secondary | ICD-10-CM | POA: Diagnosis not present

## 2018-01-22 DIAGNOSIS — I890 Lymphedema, not elsewhere classified: Secondary | ICD-10-CM | POA: Diagnosis not present

## 2018-01-23 ENCOUNTER — Ambulatory Visit (INDEPENDENT_AMBULATORY_CARE_PROVIDER_SITE_OTHER): Payer: Medicare HMO | Admitting: General Practice

## 2018-01-23 DIAGNOSIS — Z7901 Long term (current) use of anticoagulants: Secondary | ICD-10-CM | POA: Diagnosis not present

## 2018-01-23 DIAGNOSIS — I482 Chronic atrial fibrillation, unspecified: Secondary | ICD-10-CM

## 2018-01-23 DIAGNOSIS — I890 Lymphedema, not elsewhere classified: Secondary | ICD-10-CM | POA: Diagnosis not present

## 2018-01-23 DIAGNOSIS — I272 Pulmonary hypertension, unspecified: Secondary | ICD-10-CM

## 2018-01-23 LAB — POCT INR: INR: 1.3

## 2018-01-23 NOTE — Patient Instructions (Addendum)
Pre visit review using our clinic review tool, if applicable. No additional management support is needed unless otherwise documented below in the visit note.  Take 1 1/2 tablets for 2 days and then resume taking 1 tablet daily and re-check in 2 weeks.

## 2018-01-24 DIAGNOSIS — I890 Lymphedema, not elsewhere classified: Secondary | ICD-10-CM | POA: Diagnosis not present

## 2018-01-25 DIAGNOSIS — H5213 Myopia, bilateral: Secondary | ICD-10-CM | POA: Diagnosis not present

## 2018-01-25 DIAGNOSIS — I890 Lymphedema, not elsewhere classified: Secondary | ICD-10-CM | POA: Diagnosis not present

## 2018-01-26 DIAGNOSIS — I890 Lymphedema, not elsewhere classified: Secondary | ICD-10-CM | POA: Diagnosis not present

## 2018-01-27 DIAGNOSIS — I890 Lymphedema, not elsewhere classified: Secondary | ICD-10-CM | POA: Diagnosis not present

## 2018-01-28 DIAGNOSIS — I890 Lymphedema, not elsewhere classified: Secondary | ICD-10-CM | POA: Diagnosis not present

## 2018-01-29 ENCOUNTER — Ambulatory Visit
Admission: RE | Admit: 2018-01-29 | Discharge: 2018-01-29 | Disposition: A | Discharge status: Home / Self Care | Payer: Medicare HMO | Source: Ambulatory Visit | Attending: Adult Health | Admitting: Adult Health

## 2018-01-29 DIAGNOSIS — C50412 Malignant neoplasm of upper-outer quadrant of left female breast: Secondary | ICD-10-CM

## 2018-01-29 DIAGNOSIS — I890 Lymphedema, not elsewhere classified: Secondary | ICD-10-CM | POA: Diagnosis not present

## 2018-01-29 DIAGNOSIS — Z170 Estrogen receptor positive status [ER+]: Principal | ICD-10-CM

## 2018-01-29 DIAGNOSIS — R928 Other abnormal and inconclusive findings on diagnostic imaging of breast: Secondary | ICD-10-CM | POA: Diagnosis not present

## 2018-01-29 HISTORY — DX: Personal history of irradiation: Z92.3

## 2018-01-30 DIAGNOSIS — I890 Lymphedema, not elsewhere classified: Secondary | ICD-10-CM | POA: Diagnosis not present

## 2018-02-01 DIAGNOSIS — I890 Lymphedema, not elsewhere classified: Secondary | ICD-10-CM | POA: Diagnosis not present

## 2018-02-02 DIAGNOSIS — I890 Lymphedema, not elsewhere classified: Secondary | ICD-10-CM | POA: Diagnosis not present

## 2018-02-03 DIAGNOSIS — I890 Lymphedema, not elsewhere classified: Secondary | ICD-10-CM | POA: Diagnosis not present

## 2018-02-04 DIAGNOSIS — I890 Lymphedema, not elsewhere classified: Secondary | ICD-10-CM | POA: Diagnosis not present

## 2018-02-05 DIAGNOSIS — I890 Lymphedema, not elsewhere classified: Secondary | ICD-10-CM | POA: Diagnosis not present

## 2018-02-05 NOTE — Progress Notes (Signed)
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 exam is normal today with signs of pulmonary edema.  3. Chronic atrial fibrillation, hyperlipidemia, HTN, lymphedema, pulmonary hypertension, obesity, hypothyroidism -Managed by her PCP and Cardiologist (Dr. Gwenlyn Found) -On Lasix, Lovastatin, Diovan-HCT, Coumadin 5 mg, and synthroid.  4. Anemia of chronic disease (CKD) and iron deficiency  -The patient has chronic mild anemia likely caused by CKD. -I checked her iron level in December 2018, ferritin was 33, relatively low given her CKD. She started oral ferrous sulfate twice daily, tolerating well. -Hemoglobin 11.1 today, overall stable. -She has been tolerating her iron supplement well with prophylactic MiraLAX.   5. Osteoarthritis, arthralgia -She has required multiple series injection to her left hip and bilateral knees. She uses a walker and wheelchair when she goes out. -We previously discussed his potential side effect of arthralgia from aromatase  inhibitor, she will monitor closely. -I encouraged her to be physically active, and exercise.  6. Osteopenia  -She had a bone density scan in 2015 in [place], it was normal per patient. -I previously discussed the potential osteopenia and osteoporosis side effects from aromatase inhibitor, I encouraged her to take calcium and vitamin D. -08/2017 DEXA showed osteopenia with a T-Score of -1.4 at the left femur neck  PLAN -Continue Exemestane  -Lab and f/u in 6 months, order Mammogram at that time   No orders of the defined types were placed in this encounter.   All questions were answered. The patient knows to call the clinic with any problems, questions or concerns. I spent 20 minutes counseling the patient face to face. The total time spent in the appointment was 25 minutes and more than 50% was on counseling.   This document serves as a record of services personally performed by Truitt Merle, MD. It was created on her behalf by Theresia Bough, a trained medical scribe. The creation of this record is based on the scribe's personal observations and the provider's statements to them.   I have reviewed the above documentation for accuracy and completeness, and I agree with the above.    Truitt Merle, MD 02/07/2018 2:23 PM

## 2018-02-06 ENCOUNTER — Ambulatory Visit (INDEPENDENT_AMBULATORY_CARE_PROVIDER_SITE_OTHER): Payer: Medicare HMO | Admitting: General Practice

## 2018-02-06 DIAGNOSIS — I890 Lymphedema, not elsewhere classified: Secondary | ICD-10-CM | POA: Diagnosis not present

## 2018-02-06 DIAGNOSIS — Z7901 Long term (current) use of anticoagulants: Secondary | ICD-10-CM

## 2018-02-06 DIAGNOSIS — I482 Chronic atrial fibrillation, unspecified: Secondary | ICD-10-CM

## 2018-02-06 LAB — POCT INR: INR: 3.7

## 2018-02-06 NOTE — Patient Instructions (Addendum)
Pre visit review using our clinic review tool, if applicable. No additional management support is needed unless otherwise documented below in the visit note.  Skip coumadin today and then take 1 tablet daily except take 1/2 tablet on Tuesdays.  Re-check in 6 weeks per patient request.  Patient is going out of the country for several weeks.

## 2018-02-07 ENCOUNTER — Telehealth: Payer: Self-pay

## 2018-02-07 ENCOUNTER — Telehealth: Payer: Self-pay | Admitting: Hematology

## 2018-02-07 ENCOUNTER — Encounter: Payer: Self-pay | Admitting: Hematology

## 2018-02-07 ENCOUNTER — Inpatient Hospital Stay: Payer: Medicare HMO

## 2018-02-07 ENCOUNTER — Inpatient Hospital Stay: Payer: Medicare HMO | Attending: Hematology | Admitting: Hematology

## 2018-02-07 VITALS — BP 153/82 | HR 66 | Temp 97.7°F | Resp 18 | Ht 62.0 in | Wt 275.4 lb

## 2018-02-07 DIAGNOSIS — Z79899 Other long term (current) drug therapy: Secondary | ICD-10-CM | POA: Diagnosis not present

## 2018-02-07 DIAGNOSIS — C50412 Malignant neoplasm of upper-outer quadrant of left female breast: Secondary | ICD-10-CM

## 2018-02-07 DIAGNOSIS — Z7901 Long term (current) use of anticoagulants: Secondary | ICD-10-CM | POA: Diagnosis not present

## 2018-02-07 DIAGNOSIS — Z923 Personal history of irradiation: Secondary | ICD-10-CM | POA: Diagnosis not present

## 2018-02-07 DIAGNOSIS — Z170 Estrogen receptor positive status [ER+]: Principal | ICD-10-CM

## 2018-02-07 DIAGNOSIS — Z79811 Long term (current) use of aromatase inhibitors: Secondary | ICD-10-CM | POA: Diagnosis not present

## 2018-02-07 DIAGNOSIS — I890 Lymphedema, not elsewhere classified: Secondary | ICD-10-CM | POA: Insufficient documentation

## 2018-02-07 LAB — COMPREHENSIVE METABOLIC PANEL
ALBUMIN: 3.6 g/dL (ref 3.5–5.0)
ALK PHOS: 173 U/L — AB (ref 40–150)
ALT: 36 U/L (ref 0–55)
AST: 25 U/L (ref 5–34)
Anion gap: 9 (ref 3–11)
BILIRUBIN TOTAL: 0.7 mg/dL (ref 0.2–1.2)
BUN: 51 mg/dL — ABNORMAL HIGH (ref 7–26)
CALCIUM: 9.9 mg/dL (ref 8.4–10.4)
CO2: 25 mmol/L (ref 22–29)
Chloride: 103 mmol/L (ref 98–109)
Creatinine, Ser: 2 mg/dL — ABNORMAL HIGH (ref 0.60–1.10)
GFR, EST AFRICAN AMERICAN: 28 mL/min — AB (ref >60)
GFR, EST NON AFRICAN AMERICAN: 24 mL/min — AB (ref >60)
Glucose, Bld: 87 mg/dL (ref 70–140)
POTASSIUM: 4 mmol/L (ref 3.5–5.1)
Sodium: 137 mmol/L (ref 136–145)
Total Protein: 7.4 g/dL (ref 6.4–8.3)

## 2018-02-07 LAB — CBC WITH DIFFERENTIAL (CANCER CENTER ONLY)
BASOS ABS: 0 10*3/uL (ref 0.0–0.1)
BASOS PCT: 1 %
Eosinophils Absolute: 0.2 10*3/uL (ref 0.0–0.5)
Eosinophils Relative: 4 %
HEMATOCRIT: 36.1 % (ref 34.8–46.6)
Hemoglobin: 11.1 g/dL — ABNORMAL LOW (ref 11.6–15.9)
LYMPHS PCT: 22 %
Lymphs Abs: 1.2 10*3/uL (ref 0.9–3.3)
MCH: 29.3 pg (ref 25.1–34.0)
MCHC: 30.7 g/dL — ABNORMAL LOW (ref 31.5–36.0)
MCV: 95.3 fL (ref 79.5–101.0)
MONO ABS: 0.6 10*3/uL (ref 0.1–0.9)
Monocytes Relative: 11 %
NEUTROS ABS: 3.5 10*3/uL (ref 1.5–6.5)
NEUTROS PCT: 62 %
Platelet Count: 204 10*3/uL (ref 145–400)
RBC: 3.79 MIL/uL (ref 3.70–5.45)
RDW: 14.9 % — AB (ref 11.2–14.5)
WBC: 5.5 10*3/uL (ref 3.9–10.3)

## 2018-02-07 LAB — RETICULOCYTES
RBC.: 3.79 MIL/uL (ref 3.70–5.45)
RETIC COUNT ABSOLUTE: 26.5 10*3/uL — AB (ref 33.7–90.7)
Retic Ct Pct: 0.7 % (ref 0.7–2.1)

## 2018-02-07 NOTE — Telephone Encounter (Signed)
Scheduled appt per 4/3 los - Gave patient AVS and calender per los.  

## 2018-02-07 NOTE — Telephone Encounter (Signed)
She needs to be taken to and from her terminal in a w/c Thx

## 2018-02-07 NOTE — Telephone Encounter (Signed)
informed pt letter would be ready by Friday.  What does letter need to say?  Pt states she has leg problems and needs extra space for long flights

## 2018-02-07 NOTE — Telephone Encounter (Signed)
-----   Message from Warden Fillers, RN sent at 02/06/2018 10:23 AM EDT ----- Regarding: Letter Patient is going to travel to [place] in mid April and wants a letter from you stating that she needs assistance at the airport terminals.  She wants to pick up this letter tomorrow morning.  Jenny Reichmann

## 2018-02-08 DIAGNOSIS — I890 Lymphedema, not elsewhere classified: Secondary | ICD-10-CM | POA: Diagnosis not present

## 2018-02-08 NOTE — Telephone Encounter (Signed)
Letter written and placed up front for pt to pick up

## 2018-02-09 DIAGNOSIS — I890 Lymphedema, not elsewhere classified: Secondary | ICD-10-CM | POA: Diagnosis not present

## 2018-02-10 DIAGNOSIS — I890 Lymphedema, not elsewhere classified: Secondary | ICD-10-CM | POA: Diagnosis not present

## 2018-02-11 DIAGNOSIS — I890 Lymphedema, not elsewhere classified: Secondary | ICD-10-CM | POA: Diagnosis not present

## 2018-02-12 ENCOUNTER — Other Ambulatory Visit: Payer: Self-pay | Admitting: Internal Medicine

## 2018-02-12 DIAGNOSIS — I890 Lymphedema, not elsewhere classified: Secondary | ICD-10-CM | POA: Diagnosis not present

## 2018-02-13 DIAGNOSIS — I890 Lymphedema, not elsewhere classified: Secondary | ICD-10-CM | POA: Diagnosis not present

## 2018-02-14 DIAGNOSIS — I890 Lymphedema, not elsewhere classified: Secondary | ICD-10-CM | POA: Diagnosis not present

## 2018-02-15 DIAGNOSIS — I890 Lymphedema, not elsewhere classified: Secondary | ICD-10-CM | POA: Diagnosis not present

## 2018-02-16 DIAGNOSIS — Z170 Estrogen receptor positive status [ER+]: Secondary | ICD-10-CM | POA: Diagnosis not present

## 2018-02-16 DIAGNOSIS — I890 Lymphedema, not elsewhere classified: Secondary | ICD-10-CM | POA: Diagnosis not present

## 2018-02-16 DIAGNOSIS — C50412 Malignant neoplasm of upper-outer quadrant of left female breast: Secondary | ICD-10-CM | POA: Diagnosis not present

## 2018-02-17 DIAGNOSIS — I890 Lymphedema, not elsewhere classified: Secondary | ICD-10-CM | POA: Diagnosis not present

## 2018-02-18 DIAGNOSIS — I890 Lymphedema, not elsewhere classified: Secondary | ICD-10-CM | POA: Diagnosis not present

## 2018-02-19 DIAGNOSIS — I890 Lymphedema, not elsewhere classified: Secondary | ICD-10-CM | POA: Diagnosis not present

## 2018-02-20 DIAGNOSIS — I890 Lymphedema, not elsewhere classified: Secondary | ICD-10-CM | POA: Diagnosis not present

## 2018-03-26 DIAGNOSIS — I890 Lymphedema, not elsewhere classified: Secondary | ICD-10-CM | POA: Diagnosis not present

## 2018-03-27 ENCOUNTER — Telehealth: Payer: Self-pay | Admitting: Hematology

## 2018-03-27 DIAGNOSIS — I129 Hypertensive chronic kidney disease with stage 1 through stage 4 chronic kidney disease, or unspecified chronic kidney disease: Secondary | ICD-10-CM | POA: Diagnosis not present

## 2018-03-27 DIAGNOSIS — N184 Chronic kidney disease, stage 4 (severe): Secondary | ICD-10-CM | POA: Diagnosis not present

## 2018-03-27 DIAGNOSIS — I890 Lymphedema, not elsewhere classified: Secondary | ICD-10-CM | POA: Diagnosis not present

## 2018-03-27 DIAGNOSIS — R600 Localized edema: Secondary | ICD-10-CM | POA: Diagnosis not present

## 2018-03-27 DIAGNOSIS — Z6841 Body Mass Index (BMI) 40.0 and over, adult: Secondary | ICD-10-CM | POA: Diagnosis not present

## 2018-03-27 DIAGNOSIS — C50919 Malignant neoplasm of unspecified site of unspecified female breast: Secondary | ICD-10-CM | POA: Diagnosis not present

## 2018-03-27 NOTE — Telephone Encounter (Signed)
Called pt, left msg on voice mail re 04/12/18 appt rescheduled to 1 pm.

## 2018-03-28 ENCOUNTER — Telehealth: Payer: Self-pay | Admitting: Hematology

## 2018-03-28 DIAGNOSIS — I890 Lymphedema, not elsewhere classified: Secondary | ICD-10-CM | POA: Diagnosis not present

## 2018-03-28 NOTE — Telephone Encounter (Signed)
Appointments scheduled [facility] called and left message for patient per 5/19 sch msg

## 2018-03-29 DIAGNOSIS — I890 Lymphedema, not elsewhere classified: Secondary | ICD-10-CM | POA: Diagnosis not present

## 2018-03-30 DIAGNOSIS — I890 Lymphedema, not elsewhere classified: Secondary | ICD-10-CM | POA: Diagnosis not present

## 2018-03-31 ENCOUNTER — Emergency Department (HOSPITAL_COMMUNITY)
Admission: EM | Admit: 2018-03-31 | Discharge: 2018-03-31 | Disposition: A | Discharge status: Home / Self Care | Payer: Medicare HMO | Attending: Emergency Medicine | Admitting: Emergency Medicine

## 2018-03-31 ENCOUNTER — Encounter (HOSPITAL_COMMUNITY): Payer: Self-pay

## 2018-03-31 DIAGNOSIS — N184 Chronic kidney disease, stage 4 (severe): Secondary | ICD-10-CM | POA: Diagnosis not present

## 2018-03-31 DIAGNOSIS — Z853 Personal history of malignant neoplasm of breast: Secondary | ICD-10-CM | POA: Diagnosis not present

## 2018-03-31 DIAGNOSIS — Z7901 Long term (current) use of anticoagulants: Secondary | ICD-10-CM | POA: Insufficient documentation

## 2018-03-31 DIAGNOSIS — L600 Ingrowing nail: Secondary | ICD-10-CM

## 2018-03-31 DIAGNOSIS — E039 Hypothyroidism, unspecified: Secondary | ICD-10-CM | POA: Diagnosis not present

## 2018-03-31 DIAGNOSIS — I482 Chronic atrial fibrillation: Secondary | ICD-10-CM | POA: Diagnosis not present

## 2018-03-31 DIAGNOSIS — Y929 Unspecified place or not applicable: Secondary | ICD-10-CM | POA: Insufficient documentation

## 2018-03-31 DIAGNOSIS — I129 Hypertensive chronic kidney disease with stage 1 through stage 4 chronic kidney disease, or unspecified chronic kidney disease: Secondary | ICD-10-CM | POA: Diagnosis not present

## 2018-03-31 DIAGNOSIS — Y939 Activity, unspecified: Secondary | ICD-10-CM | POA: Insufficient documentation

## 2018-03-31 DIAGNOSIS — E785 Hyperlipidemia, unspecified: Secondary | ICD-10-CM | POA: Insufficient documentation

## 2018-03-31 DIAGNOSIS — Z79899 Other long term (current) drug therapy: Secondary | ICD-10-CM | POA: Insufficient documentation

## 2018-03-31 DIAGNOSIS — S6992XA Unspecified injury of left wrist, hand and finger(s), initial encounter: Secondary | ICD-10-CM | POA: Diagnosis present

## 2018-03-31 DIAGNOSIS — W230XXA Caught, crushed, jammed, or pinched between moving objects, initial encounter: Secondary | ICD-10-CM | POA: Insufficient documentation

## 2018-03-31 DIAGNOSIS — I890 Lymphedema, not elsewhere classified: Secondary | ICD-10-CM | POA: Diagnosis not present

## 2018-03-31 DIAGNOSIS — Y998 Other external cause status: Secondary | ICD-10-CM | POA: Insufficient documentation

## 2018-03-31 MED ORDER — LIDOCAINE HCL 2 % IJ SOLN
20.0000 mL | Freq: Once | INTRAMUSCULAR | Status: AC
  Administered 2018-03-31: 400 mg
  Filled 2018-03-31: qty 20

## 2018-03-31 NOTE — ED Triage Notes (Signed)
Pt presents for evaluation of left thumb injury. States 2-3 days ago injured nail to L thumb and in the past day has developed a small raised red "lump" at site of injury. Pt reports pain with palpation.

## 2018-03-31 NOTE — ED Provider Notes (Signed)
[facility] EMERGENCY DEPARTMENT Provider Note   CSN: [id number] Arrival date & time: 03/31/18  9702     History   Chief Complaint Chief Complaint  Patient presents with  . Finger Injury    HPI Katelyn Jackson is a 72 y.o. female a history of chronic atrial fibrillation on Coumadin presents to the emergency department with a chief complaint of left thumb injury.  The patient reports that 3 to 4 days ago that she got the thumb stuck in her zipper.  Over the last 1 to 2 days, she noted a painful "lump" to the left thumb.  The lump has been constant, but gradually enlarging.  Pain to the area is worse with palpation and alleviated by nothing.  No history of similar.  No treatment prior to arrival.  The history is provided by the patient. No language interpreter was used.    Past Medical History:  Diagnosis Date  . Arthritis   . Cancer [facility])    breast cancer  . Chronic atrial fibrillation ([place])   . Chronic kidney disease    sees Dr Florene Glen  . Cramps, muscle, general   . Dyspnea   . Dyspnea on exertion   . Dysrhythmia   . GERD (gastroesophageal reflux disease)   . Headache   . Hyperlipidemia   . Hypertension   . Hypothyroidism   . Lymphedema   . Moderate to severe pulmonary hypertension ([place])   . Obesity   . Personal history of radiation therapy     Patient Active Problem List   Diagnosis Date Noted  . Wheezing 10/26/2017  . Sciatica of left side 08/29/2017  . Long term (current) use of anticoagulants 08/02/2017  . Morbid obesity ([place]) 07/11/2017  . Hyperglycemia 07/11/2017  . Intractable low back pain 06/19/2017  . CRF (chronic renal failure), stage 4 (severe) ([place]) 06/19/2017  . Hypokalemia 06/19/2017  . Acute lower UTI 06/19/2017  . Anemia of chronic disease 02/16/2017  . Malignant neoplasm of upper-outer quadrant of left breast in female, estrogen receptor positive ([place]) 02/10/2017  . Essential hypertension 04/05/2016  . Dyslipidemia  04/05/2016  . Chronic atrial fibrillation ([place]) 04/05/2016  . Pulmonary hypertension ([place]) 04/05/2016  . Lymphedema 04/05/2016    Past Surgical History:  Procedure Laterality Date  . APPENDECTOMY    . BREAST BIOPSY Left 2018  . BREAST LUMPECTOMY Left 04/04/2017  . BREAST LUMPECTOMY WITH NEEDLE LOCALIZATION AND AXILLARY SENTINEL LYMPH NODE BX Left 04/04/2017   Procedure: BREAST LUMPECTOMY WITH NEEDLE LOCALIZATION x3 AND AXILLARY SENTINEL LYMPH NODE BX;  Surgeon: [name], MD;  Location: [facility];  Service: General;  Laterality: Left;  . CATARACT EXTRACTION W/ INTRAOCULAR LENS  IMPLANT, BILATERAL Bilateral 2018  . COLONOSCOPY    . EYE SURGERY    . GLAUCOMA SURGERY Bilateral 2018  . RE-EXCISION OF BREAST CANCER,SUPERIOR MARGINS Left 04/26/2017   Procedure: RE-EXCISION OF LEFT BREAST CANCER;  Surgeon: [name], MD;  Location: [facility];  Service: General;  Laterality: Left;     OB History   None      Home Medications    Prior to Admission medications   Medication Sig Start Date End Date Taking? Authorizing Provider  carvedilol (COREG) 6.25 MG tablet Take 1 tablet (6.25 mg total) by mouth 2 (two) times daily with a meal. 02/12/18   Plotnikov, [name], MD  Cholecalciferol (VITAMIN D3) 2000 units capsule Take 1 capsule (2,000 Units total) by mouth daily. 07/11/17   Plotnikov, [name], MD  exemestane (AROMASIN) 25 MG tablet Take 1 tablet (25 mg total) by mouth daily after breakfast. 11/09/17   Truitt Merle, MD  febuxostat (ULORIC) 40 MG tablet Take 1 tablet (40 mg total) by mouth daily. 11/15/17   Plotnikov, [name], MD  furosemide (LASIX) 40 MG tablet Take 1 tablet (40 mg total) by mouth daily as needed. 08/29/17 08/29/18  Plotnikov, [name], MD  irbesartan-hydrochlorothiazide (AVALIDE) 150-12.5 MG tablet TAKE 1 TABLET BY MOUTH EVERY DAY 02/12/18   Plotnikov, [name], MD  levothyroxine (SYNTHROID, LEVOTHROID) 50 MCG tablet Take 1 tablet (50 mcg total) by mouth daily before breakfast.  11/15/17   Plotnikov, [name], MD  lipase/protease/amylase (CREON) 12000 units CPEP capsule Take 1 capsule (12,000 Units total) by mouth daily as needed (stomach problems). 11/15/17   Plotnikov, [name], MD  lovastatin (MEVACOR) 20 MG tablet Take 1 tablet (20 mg total) by mouth daily. 11/15/17   Plotnikov, [name], MD  methocarbamol (ROBAXIN) 500 MG tablet Take 1 tablet (500 mg total) by mouth 4 (four) times daily. 11/15/17   Plotnikov, [name], MD  omeprazole (PRILOSEC) 40 MG capsule Take 1 capsule (40 mg total) by mouth daily as needed (acid reflux). 11/15/17   Plotnikov, [name], MD  polyethylene glycol powder (GLYCOLAX/MIRALAX) powder mix 1 capful (17 grams) IN 8 ounces OF liquid EVERY DAY 01/05/18   Plotnikov, [name], MD  potassium chloride (KLOR-CON) 8 MEQ tablet Take 1 tablet (8 mEq total) by mouth daily. 08/29/17 08/29/18  Plotnikov, [name], MD  traMADol (ULTRAM) 50 MG tablet Take 1 tablet (50 mg total) by mouth every 6 (six) hours as needed. 08/29/17   Plotnikov, [name], MD  VOLTAREN 1 % GEL apply 4 grams 4 TIMES DAILY AS NEEDED FOR 30 DAYS 01/09/18   Plotnikov, [name], MD  warfarin (COUMADIN) 5 MG tablet Take 5 mg by mouth daily. None on Wed.    [provider]    Family History Family History  Problem Relation Age of Onset  . CAD Mother 41  . Stroke Mother 28       hemorr CVA  . COPD Father 7  . Cancer Neg Hx     Social History Social History   Tobacco Use  . Smoking status: Never Smoker  . Smokeless tobacco: Never Used  Substance Use Topics  . Alcohol use: No    Alcohol/week: 0.0 oz  . Drug use: No     Allergies   Chlorhexidine gluconate and Penicillins   Review of Systems Review of Systems  Constitutional: Negative for activity change, chills and fever.  Respiratory: Negative for shortness of breath.   Cardiovascular: Negative for chest pain.  Gastrointestinal: Negative for abdominal pain.  Musculoskeletal: Negative for back pain.  Skin:  Positive for wound. Negative for rash.  Neurological: Negative for numbness.     Physical Exam Updated Vital Signs BP (!) 150/80 (BP Location: Right Arm)   Pulse 84   Temp 98.5 F (36.9 C) (Oral)   Resp 16   Ht 5\' 1"  (1.549 m)   Wt 126.1 kg (278 lb)   LMP  (LMP Unknown)   SpO2 100%   BMI 52.53 kg/m   Physical Exam  Constitutional: No distress.  HENT:  Head: Normocephalic.  Eyes: Conjunctivae are normal.  Neck: Neck supple.  Cardiovascular: Normal rate and regular rhythm. Exam reveals no gallop and no friction rub.  No murmur heard. Pulmonary/Chest: Effort normal. No respiratory distress.  Abdominal: Soft. She exhibits no  distension.  Neurological: She is alert.  Skin: Skin is warm. No rash noted.  A 3 mm area of granulation tissue is located adjacent to the nailbed on the left thumb along the radial aspect.  No surrounding erythema, edema, warmth.  The nail inferior to the granulation tissue appears jagged and is consistent with an ingrown fingernail.  Psychiatric: Her behavior is normal.  Nursing note and vitals reviewed.        ED Treatments / Results  Labs (all labs ordered are listed, but only abnormal results are displayed) Labs Reviewed - No data to display  EKG None  Radiology No results found.  Procedures .Nail Removal Date/Time: 03/31/2018 2:50 PM Performed by: Joanne Gavel, PA-C Authorized by: Joanne Gavel, PA-C   Consent:    Consent obtained:  Verbal   Consent given by:  Patient   Risks discussed:  Bleeding, incomplete removal, infection, pain and permanent nail deformity Location:    Hand:  L thumb Pre-procedure details:    Skin preparation:  Betadine   Preparation: Patient was prepped and draped in the usual sterile fashion   Anesthesia (see MAR for exact dosages):    Anesthesia method:  Nerve block   Block needle gauge:  25 G   Block anesthetic:  Lidocaine 2% w/o epi   Block technique:  Digital block   Block injection  procedure:  Anatomic landmarks identified, introduced needle, incremental injection, negative aspiration for blood and anatomic landmarks palpated   Block outcome:  Anesthesia achieved Nail Removal:    Nail removed:  Partial   Nail side:  Radial Ingrown nail:    Wedge excision of skin: yes     Nail matrix removed or ablated:  None Nails trimmed:    Number of nails trimmed:  1 Post-procedure details:    Dressing:  Gauze roll and antibiotic ointment   Patient tolerance of procedure:  Tolerated well, no immediate complications Comments:     A digital block was performed.  Granulation tissue was excised.  The nail was partially avulsed along the radial aspect of the nailbed until a smooth border was obtained.  Less than 2 mm of nail was removed.  The patient is on Coumadin and following the procedure, a pressure dressing was applied, and the digit was elevated above the level of the heart.  After approximately 10 minutes, hemostasis was achieved.   (including critical care time)  Medications Ordered in ED Medications  lidocaine (XYLOCAINE) 2 % (with pres) injection 400 mg (400 mg Infiltration Given 03/31/18 1304)     Initial Impression / Assessment and Plan / ED Course  I have reviewed the triage vital signs and the nursing notes.  Pertinent labs & imaging results that were available during my care of the patient were reviewed by me and considered in my medical decision making (see chart for details).      72 year old female with a history of chronic atrial fibrillation on Coumadin presents with a mass to the left thumb that began 1 to 2 days ago after she got the left thumb caught in his upper 3 to 4 days ago.  The patient was seen and evaluated along with Dr. Leonette Monarch, attending physician.  A digital block was performed.  Granulation tissue was excised.  The nail was partially avulsed along the radial aspect of the nailbed until a smooth border was obtained.  Less than 2 mm of nail was  removed.  The patient is on Coumadin and following the procedure,  a pressure dressing was applied, and the digit was elevated above the level of the heart.  After approximately 10 minutes, hemostasis was achieved, and the left hand was copiously irrigated with warm water and soap for applying topical bacitracin and a sterile dressing.  Home wound care instructions provided in [place] and [name].  Strict return precautions to the emergency department given.  She is hemodynamically stable and in no acute distress.  She is safe for discharge to home with outpatient follow-up at this time.  Final Clinical Impressions(s) / ED Diagnoses   Final diagnoses:  Ingrown thumb nail, left    ED Discharge Orders    None       Joanne Gavel, PA-C 03/31/18 1455    Cardama, Grayce Sessions, MD 03/31/18 [phone]

## 2018-03-31 NOTE — Discharge Instructions (Addendum)
°???????, ??? ????????? ??? ??????? ??????? ??? ?????? ? ????????? ?????????? ??????. ° °????? ???????????? ??????? ?????? ???????? ??????, ????????? ???????? ??? ???? ?? ???? ??? ?????? ????? ? ?????. ?? ?????? ??????? ?????????? ??????????, ????? ??? ??????????, ?? ????? ?? ????????? ??????. ° °??????????, ??????????????????? ? ????? ???????? ??????? ?????? ??? ????????? ????????, ???? ?????? ?? ????? ? ???????   1 ??????.  ??????????, ?? ?????????? ??????? ????? ? ???? ?? ?????????? ?????, ???? ?? ?? ???????.  ???? ??????? ????? ?????????? ??????? ?? ?????, ????????? ??? ?????? ????, ??? ???? ? ??? ??? ??? ?????, ????????? ? ????????? ?????????? ??????. Spasibo, chto razreshili mne segodnya okazat' vam pomoshch' v otdelenii neotlozhnoy pomoshchi.  Vazhno podderzhivat' chistotu levogo bol'shogo pal'tsa, yezhednevno promyvaya yego khotya by Rhunette Croft teploy vodoy s mylom. Vy mozhete nanesti aktual'nyy antibiotik, takoy kak batsitratsin, na palets na sleduyushchuyu nedelyu.  Pozhaluysta, prokonsul'tiruytes' s vashim osnovnym lechashchim vrachom dlya povtornoy proverki, yesli gvozd' ne zazhil v techeniye 1 nedeli.  Pozhaluysta, ne pogruzhayte bol'shoy palets v vodu na dlitel'noye vremya, poka on ne zazhivet.  Yesli bol'shoy palets stanovitsya goryachim na oshchup', pokrasnel ili sil'no opukh, ili yesli u vas zhar ili oznob, vernites' v otdeleniye neotlozhnoy pomoshchi.  Thank you for allowing me to provide your care today in the emergency department.  It is important to keep with the left thumb clean by washing it daily at least once with warm water and soap.  You can apply a topical antibiotic such as bacitracin to the finger for the next week.  Please follow-up with your primary care provider for a recheck if the nail has not healed in 1 week.  Please make sure not to submerge the thumb in water for long periods of time until it heals.  If the thumb gets hot to the touch, red, or very swollen,  or if you develop fever or chills, return to the emergency department.

## 2018-04-01 DIAGNOSIS — I890 Lymphedema, not elsewhere classified: Secondary | ICD-10-CM | POA: Diagnosis not present

## 2018-04-02 DIAGNOSIS — I890 Lymphedema, not elsewhere classified: Secondary | ICD-10-CM | POA: Diagnosis not present

## 2018-04-03 ENCOUNTER — Inpatient Hospital Stay: Payer: Medicare HMO | Admitting: Hematology

## 2018-04-03 ENCOUNTER — Inpatient Hospital Stay: Payer: Medicare HMO

## 2018-04-03 DIAGNOSIS — I890 Lymphedema, not elsewhere classified: Secondary | ICD-10-CM | POA: Diagnosis not present

## 2018-04-04 DIAGNOSIS — I890 Lymphedema, not elsewhere classified: Secondary | ICD-10-CM | POA: Diagnosis not present

## 2018-04-05 DIAGNOSIS — I890 Lymphedema, not elsewhere classified: Secondary | ICD-10-CM | POA: Diagnosis not present

## 2018-04-06 DIAGNOSIS — I890 Lymphedema, not elsewhere classified: Secondary | ICD-10-CM | POA: Diagnosis not present

## 2018-04-07 DIAGNOSIS — I890 Lymphedema, not elsewhere classified: Secondary | ICD-10-CM | POA: Diagnosis not present

## 2018-04-08 DIAGNOSIS — I890 Lymphedema, not elsewhere classified: Secondary | ICD-10-CM | POA: Diagnosis not present

## 2018-04-09 DIAGNOSIS — I890 Lymphedema, not elsewhere classified: Secondary | ICD-10-CM | POA: Diagnosis not present

## 2018-04-10 DIAGNOSIS — I890 Lymphedema, not elsewhere classified: Secondary | ICD-10-CM | POA: Diagnosis not present

## 2018-04-11 DIAGNOSIS — I890 Lymphedema, not elsewhere classified: Secondary | ICD-10-CM | POA: Diagnosis not present

## 2018-04-12 ENCOUNTER — Other Ambulatory Visit: Payer: Medicare HMO

## 2018-04-12 ENCOUNTER — Ambulatory Visit: Payer: Medicare HMO | Admitting: Hematology

## 2018-04-12 ENCOUNTER — Other Ambulatory Visit: Payer: Self-pay

## 2018-04-13 DIAGNOSIS — I890 Lymphedema, not elsewhere classified: Secondary | ICD-10-CM | POA: Diagnosis not present

## 2018-04-14 DIAGNOSIS — I890 Lymphedema, not elsewhere classified: Secondary | ICD-10-CM | POA: Diagnosis not present

## 2018-04-15 DIAGNOSIS — I890 Lymphedema, not elsewhere classified: Secondary | ICD-10-CM | POA: Diagnosis not present

## 2018-04-16 DIAGNOSIS — I890 Lymphedema, not elsewhere classified: Secondary | ICD-10-CM | POA: Diagnosis not present

## 2018-04-17 ENCOUNTER — Ambulatory Visit (INDEPENDENT_AMBULATORY_CARE_PROVIDER_SITE_OTHER): Payer: Medicare HMO | Admitting: General Practice

## 2018-04-17 DIAGNOSIS — Z7901 Long term (current) use of anticoagulants: Secondary | ICD-10-CM | POA: Diagnosis not present

## 2018-04-17 DIAGNOSIS — I482 Chronic atrial fibrillation, unspecified: Secondary | ICD-10-CM

## 2018-04-17 DIAGNOSIS — I890 Lymphedema, not elsewhere classified: Secondary | ICD-10-CM | POA: Diagnosis not present

## 2018-04-17 LAB — POCT INR: INR: 1.9 — AB (ref 2.0–3.0)

## 2018-04-17 NOTE — Patient Instructions (Addendum)
Pre visit review using our clinic review tool, if applicable. No additional management support is needed unless otherwise documented below in the visit note.  Take 1 tablet today (5 mg) and then take 1 tablet daily except take 1/2 tablet on Tuesdays.  Re-check in 4 weeks per patient request.

## 2018-04-18 DIAGNOSIS — I890 Lymphedema, not elsewhere classified: Secondary | ICD-10-CM | POA: Diagnosis not present

## 2018-04-19 DIAGNOSIS — I890 Lymphedema, not elsewhere classified: Secondary | ICD-10-CM | POA: Diagnosis not present

## 2018-04-20 DIAGNOSIS — I890 Lymphedema, not elsewhere classified: Secondary | ICD-10-CM | POA: Diagnosis not present

## 2018-04-21 DIAGNOSIS — I890 Lymphedema, not elsewhere classified: Secondary | ICD-10-CM | POA: Diagnosis not present

## 2018-04-22 DIAGNOSIS — I890 Lymphedema, not elsewhere classified: Secondary | ICD-10-CM | POA: Diagnosis not present

## 2018-04-23 ENCOUNTER — Ambulatory Visit (INDEPENDENT_AMBULATORY_CARE_PROVIDER_SITE_OTHER): Payer: Medicare HMO | Admitting: Internal Medicine

## 2018-04-23 ENCOUNTER — Encounter: Payer: Self-pay | Admitting: Internal Medicine

## 2018-04-23 VITALS — BP 132/82 | HR 74 | Temp 98.0°F | Ht 61.0 in | Wt 284.0 lb

## 2018-04-23 DIAGNOSIS — I10 Essential (primary) hypertension: Secondary | ICD-10-CM | POA: Diagnosis not present

## 2018-04-23 DIAGNOSIS — D638 Anemia in other chronic diseases classified elsewhere: Secondary | ICD-10-CM

## 2018-04-23 DIAGNOSIS — Z23 Encounter for immunization: Secondary | ICD-10-CM | POA: Diagnosis not present

## 2018-04-23 DIAGNOSIS — I890 Lymphedema, not elsewhere classified: Secondary | ICD-10-CM | POA: Diagnosis not present

## 2018-04-23 DIAGNOSIS — N184 Chronic kidney disease, stage 4 (severe): Secondary | ICD-10-CM | POA: Diagnosis not present

## 2018-04-23 DIAGNOSIS — E785 Hyperlipidemia, unspecified: Secondary | ICD-10-CM

## 2018-04-23 NOTE — Assessment & Plan Note (Signed)
BP Readings from Last 3 Encounters:  04/23/18 132/82  03/31/18 (!) 150/80  02/07/18 (!) 153/82

## 2018-04-23 NOTE — Assessment & Plan Note (Signed)
Labs w/Dr Florene Glen

## 2018-04-23 NOTE — Addendum Note (Signed)
Addended by: Karren Cobble on: 04/23/2018 11:27 AM   Modules accepted: Orders

## 2018-04-23 NOTE — Assessment & Plan Note (Signed)
Furosemide was increased per Dr Aurelio Jew [place], Lasix

## 2018-04-23 NOTE — Assessment & Plan Note (Signed)
Lovastatin 

## 2018-04-23 NOTE — Patient Instructions (Addendum)
Arnica cream for bruising °

## 2018-04-23 NOTE — Assessment & Plan Note (Signed)
Worse - discussed

## 2018-04-23 NOTE — Progress Notes (Signed)
Subjective:  Patient ID: Katelyn Jackson, female    DOB: [birth year]  Age: 72 y.o. MRN: [id number]  CC: No chief complaint on file.   HPI Katelyn Jackson presents for severe leg edema after a trip to [place]. Nephrologist increased her Furosemide - it helps F/u HTN, hypothyroidism  Outpatient Medications Prior to Visit  Medication Sig Dispense Refill  . carvedilol (COREG) 6.25 MG tablet Take 1 tablet (6.25 mg total) by mouth 2 (two) times daily with a meal. 60 tablet 11  . Cholecalciferol (VITAMIN D3) 2000 units capsule Take 1 capsule (2,000 Units total) by mouth daily. 100 capsule 3  . exemestane (AROMASIN) 25 MG tablet Take 1 tablet (25 mg total) by mouth daily after breakfast. 90 tablet 3  . febuxostat (ULORIC) 40 MG tablet Take 1 tablet (40 mg total) by mouth daily. 90 tablet 3  . furosemide (LASIX) 40 MG tablet Take 1 tablet (40 mg total) by mouth daily as needed. 90 tablet 3  . irbesartan-hydrochlorothiazide (AVALIDE) 150-12.5 MG tablet TAKE 1 TABLET BY MOUTH EVERY DAY 30 tablet 11  . levothyroxine (SYNTHROID, LEVOTHROID) 50 MCG tablet Take 1 tablet (50 mcg total) by mouth daily before breakfast. 90 tablet 3  . lipase/protease/amylase (CREON) 12000 units CPEP capsule Take 1 capsule (12,000 Units total) by mouth daily as needed (stomach problems). 90 capsule 3  . lovastatin (MEVACOR) 20 MG tablet Take 1 tablet (20 mg total) by mouth daily. 90 tablet 3  . methocarbamol (ROBAXIN) 500 MG tablet Take 1 tablet (500 mg total) by mouth 4 (four) times daily. 120 tablet 11  . omeprazole (PRILOSEC) 40 MG capsule Take 1 capsule (40 mg total) by mouth daily as needed (acid reflux). 90 capsule 3  . polyethylene glycol powder (GLYCOLAX/MIRALAX) powder mix 1 capful (17 grams) IN 8 ounces OF liquid EVERY DAY 1581 g 3  . potassium chloride (KLOR-CON) 8 MEQ tablet Take 1 tablet (8 mEq total) by mouth daily. 90 tablet 3  . traMADol (ULTRAM) 50 MG tablet Take 1 tablet (50 mg total) by mouth every 6  (six) hours as needed. 28 tablet 0  . VOLTAREN 1 % GEL apply 4 grams 4 TIMES DAILY AS NEEDED FOR 30 DAYS 100 g 5  . warfarin (COUMADIN) 5 MG tablet Take 5 mg by mouth daily. None on Wed.     No facility-administered medications prior to visit.     ROS: Review of Systems  Constitutional: Negative for activity change, appetite change, chills, fatigue and unexpected weight change.  HENT: Negative for congestion, mouth sores and sinus pressure.   Eyes: Negative for visual disturbance.  Respiratory: Negative for cough and chest tightness.   Cardiovascular: Positive for leg swelling.  Gastrointestinal: Negative for abdominal pain and nausea.  Genitourinary: Negative for difficulty urinating, frequency and vaginal pain.  Musculoskeletal: Positive for arthralgias and gait problem. Negative for back pain.  Skin: Negative for pallor and rash.  Neurological: Negative for dizziness, tremors, weakness, numbness and headaches.  Psychiatric/Behavioral: Negative for confusion, sleep disturbance and suicidal ideas.    Objective:  BP 132/82 (BP Location: Right Arm, Patient Position: Sitting, Cuff Size: Large)   Pulse 74   Temp 98 F (36.7 C) (Oral)   Ht 5\' 1"  (1.549 m)   Wt 284 lb (128.8 kg)   LMP  (LMP Unknown)   SpO2 98%   BMI 53.66 kg/m   BP Readings from Last 3 Encounters:  04/23/18 132/82  03/31/18 (!) 150/80  02/07/18 (!) 153/82    [facility]  Readings from Last 3 Encounters:  04/23/18 284 lb (128.8 kg)  03/31/18 278 lb (126.1 kg)  02/07/18 275 lb 6.4 oz (124.9 kg)    Physical Exam  Constitutional: She appears well-developed. No distress.  HENT:  Head: Normocephalic.  Right Ear: External ear normal.  Left Ear: External ear normal.  Nose: Nose normal.  Mouth/Throat: Oropharynx is clear and moist.  Eyes: Pupils are equal, round, and reactive to light. Conjunctivae are normal. Right eye exhibits no discharge. Left eye exhibits no discharge.  Neck: Normal range of motion. Neck supple.  No JVD present. No tracheal deviation present. No thyromegaly present.  Cardiovascular: Normal rate, regular rhythm and normal heart sounds.  Pulmonary/Chest: No stridor. No respiratory distress. She has no wheezes.  Abdominal: Soft. Bowel sounds are normal. She exhibits no distension and no mass. There is no tenderness. There is no rebound and no guarding.  Musculoskeletal: She exhibits edema and tenderness.  Lymphadenopathy:    She has no cervical adenopathy.  Neurological: She displays normal reflexes. No cranial nerve deficit. She exhibits normal muscle tone. Coordination abnormal.  Skin: No rash noted. No erythema.  Psychiatric: She has a normal mood and affect. Her behavior is normal. Judgment and thought content normal.  edema 2-3+ L arm bruise   Lab Results  Component Value Date   WBC 5.5 02/07/2018   HGB 11.1 (L) 02/07/2018   HCT 36.1 02/07/2018   PLT 204 02/07/2018   GLUCOSE 87 02/07/2018   ALT 36 02/07/2018   AST 25 02/07/2018   NA 137 02/07/2018   K 4.0 02/07/2018   CL 103 02/07/2018   CREATININE 2.00 (H) 02/07/2018   BUN 51 (H) 02/07/2018   CO2 25 02/07/2018   TSH 6.26 (H) 01/05/2018   INR 1.9 (A) 04/17/2018   HGBA1C 5.5 01/05/2018    No results found.  Assessment & Plan:   There are no diagnoses linked to this encounter.   No orders of the defined types were placed in this encounter.    Follow-up: No follow-ups on file.  Walker Kehr, MD

## 2018-04-25 DIAGNOSIS — I890 Lymphedema, not elsewhere classified: Secondary | ICD-10-CM | POA: Diagnosis not present

## 2018-04-26 DIAGNOSIS — I890 Lymphedema, not elsewhere classified: Secondary | ICD-10-CM | POA: Diagnosis not present

## 2018-04-27 DIAGNOSIS — I890 Lymphedema, not elsewhere classified: Secondary | ICD-10-CM | POA: Diagnosis not present

## 2018-04-28 DIAGNOSIS — I890 Lymphedema, not elsewhere classified: Secondary | ICD-10-CM | POA: Diagnosis not present

## 2018-04-29 DIAGNOSIS — I890 Lymphedema, not elsewhere classified: Secondary | ICD-10-CM | POA: Diagnosis not present

## 2018-05-01 DIAGNOSIS — I890 Lymphedema, not elsewhere classified: Secondary | ICD-10-CM | POA: Diagnosis not present

## 2018-05-02 DIAGNOSIS — I890 Lymphedema, not elsewhere classified: Secondary | ICD-10-CM | POA: Diagnosis not present

## 2018-05-03 DIAGNOSIS — I890 Lymphedema, not elsewhere classified: Secondary | ICD-10-CM | POA: Diagnosis not present

## 2018-05-05 DIAGNOSIS — I890 Lymphedema, not elsewhere classified: Secondary | ICD-10-CM | POA: Diagnosis not present

## 2018-05-06 DIAGNOSIS — I890 Lymphedema, not elsewhere classified: Secondary | ICD-10-CM | POA: Diagnosis not present

## 2018-05-08 DIAGNOSIS — I890 Lymphedema, not elsewhere classified: Secondary | ICD-10-CM | POA: Diagnosis not present

## 2018-05-09 ENCOUNTER — Other Ambulatory Visit: Payer: Self-pay | Admitting: Internal Medicine

## 2018-05-09 DIAGNOSIS — I890 Lymphedema, not elsewhere classified: Secondary | ICD-10-CM | POA: Diagnosis not present

## 2018-05-11 DIAGNOSIS — I890 Lymphedema, not elsewhere classified: Secondary | ICD-10-CM | POA: Diagnosis not present

## 2018-05-12 DIAGNOSIS — I890 Lymphedema, not elsewhere classified: Secondary | ICD-10-CM | POA: Diagnosis not present

## 2018-05-13 DIAGNOSIS — I890 Lymphedema, not elsewhere classified: Secondary | ICD-10-CM | POA: Diagnosis not present

## 2018-05-14 ENCOUNTER — Telehealth: Payer: Self-pay | Admitting: Internal Medicine

## 2018-05-14 MED ORDER — FEBUXOSTAT 40 MG PO TABS: 40.0000 mg | ORAL_TABLET | Freq: Every day | ORAL | 3 refills | Status: DC

## 2018-05-14 NOTE — Telephone Encounter (Signed)
Resent rx as  DAW.[name]/LMB

## 2018-05-14 NOTE — Telephone Encounter (Signed)
Copied from [place] [id number]. Topic: Quick Communication - See Telephone Encounter >> May 14, 2018  2:36 PM Percell Belt A wrote: CRM for notification. See Telephone encounter for: 05/14/18.  Pharmacy called in and stated that pt use will only cover the brand name for the   febuxostat (ULORIC) 40 MG tablet [[id number]] .  Pharmacy needs it to say exactly  " dispense as written for brands not substitutes allowed"     [facility] [phone]

## 2018-05-15 ENCOUNTER — Ambulatory Visit: Payer: Medicare HMO

## 2018-05-15 ENCOUNTER — Ambulatory Visit (INDEPENDENT_AMBULATORY_CARE_PROVIDER_SITE_OTHER): Payer: Medicare HMO | Admitting: General Practice

## 2018-05-15 DIAGNOSIS — Z7901 Long term (current) use of anticoagulants: Secondary | ICD-10-CM | POA: Diagnosis not present

## 2018-05-15 DIAGNOSIS — I482 Chronic atrial fibrillation, unspecified: Secondary | ICD-10-CM

## 2018-05-15 DIAGNOSIS — I890 Lymphedema, not elsewhere classified: Secondary | ICD-10-CM | POA: Diagnosis not present

## 2018-05-15 LAB — POCT INR: INR: 2.7 (ref 2.0–3.0)

## 2018-05-15 NOTE — Patient Instructions (Addendum)
Pre visit review using our clinic review tool, if applicable. No additional management support is needed unless otherwise documented below in the visit note.  Continue to take 1 tablet daily except take 1/2 tablet on Tuesdays.  Re-check in 4 weeks per patient request.

## 2018-05-16 DIAGNOSIS — I890 Lymphedema, not elsewhere classified: Secondary | ICD-10-CM | POA: Diagnosis not present

## 2018-05-17 DIAGNOSIS — I890 Lymphedema, not elsewhere classified: Secondary | ICD-10-CM | POA: Diagnosis not present

## 2018-05-18 DIAGNOSIS — I890 Lymphedema, not elsewhere classified: Secondary | ICD-10-CM | POA: Diagnosis not present

## 2018-05-19 DIAGNOSIS — I890 Lymphedema, not elsewhere classified: Secondary | ICD-10-CM | POA: Diagnosis not present

## 2018-05-20 DIAGNOSIS — I890 Lymphedema, not elsewhere classified: Secondary | ICD-10-CM | POA: Diagnosis not present

## 2018-05-21 DIAGNOSIS — I890 Lymphedema, not elsewhere classified: Secondary | ICD-10-CM | POA: Diagnosis not present

## 2018-05-22 DIAGNOSIS — I890 Lymphedema, not elsewhere classified: Secondary | ICD-10-CM | POA: Diagnosis not present

## 2018-05-23 DIAGNOSIS — I890 Lymphedema, not elsewhere classified: Secondary | ICD-10-CM | POA: Diagnosis not present

## 2018-05-24 DIAGNOSIS — I890 Lymphedema, not elsewhere classified: Secondary | ICD-10-CM | POA: Diagnosis not present

## 2018-05-25 DIAGNOSIS — I890 Lymphedema, not elsewhere classified: Secondary | ICD-10-CM | POA: Diagnosis not present

## 2018-05-26 DIAGNOSIS — I890 Lymphedema, not elsewhere classified: Secondary | ICD-10-CM | POA: Diagnosis not present

## 2018-05-27 DIAGNOSIS — I890 Lymphedema, not elsewhere classified: Secondary | ICD-10-CM | POA: Diagnosis not present

## 2018-05-28 DIAGNOSIS — I890 Lymphedema, not elsewhere classified: Secondary | ICD-10-CM | POA: Diagnosis not present

## 2018-05-29 DIAGNOSIS — I890 Lymphedema, not elsewhere classified: Secondary | ICD-10-CM | POA: Diagnosis not present

## 2018-05-30 DIAGNOSIS — I890 Lymphedema, not elsewhere classified: Secondary | ICD-10-CM | POA: Diagnosis not present

## 2018-05-31 DIAGNOSIS — I890 Lymphedema, not elsewhere classified: Secondary | ICD-10-CM | POA: Diagnosis not present

## 2018-06-01 IMAGING — DX DG CHEST 2V
2 series · 2 of 2 positions shown · non-contrast
Comparison: 10/12/2017

CLINICAL DATA: Follow-up abnormal chest x-ray

EXAM:
CHEST  2 VIEW

[chest pa]
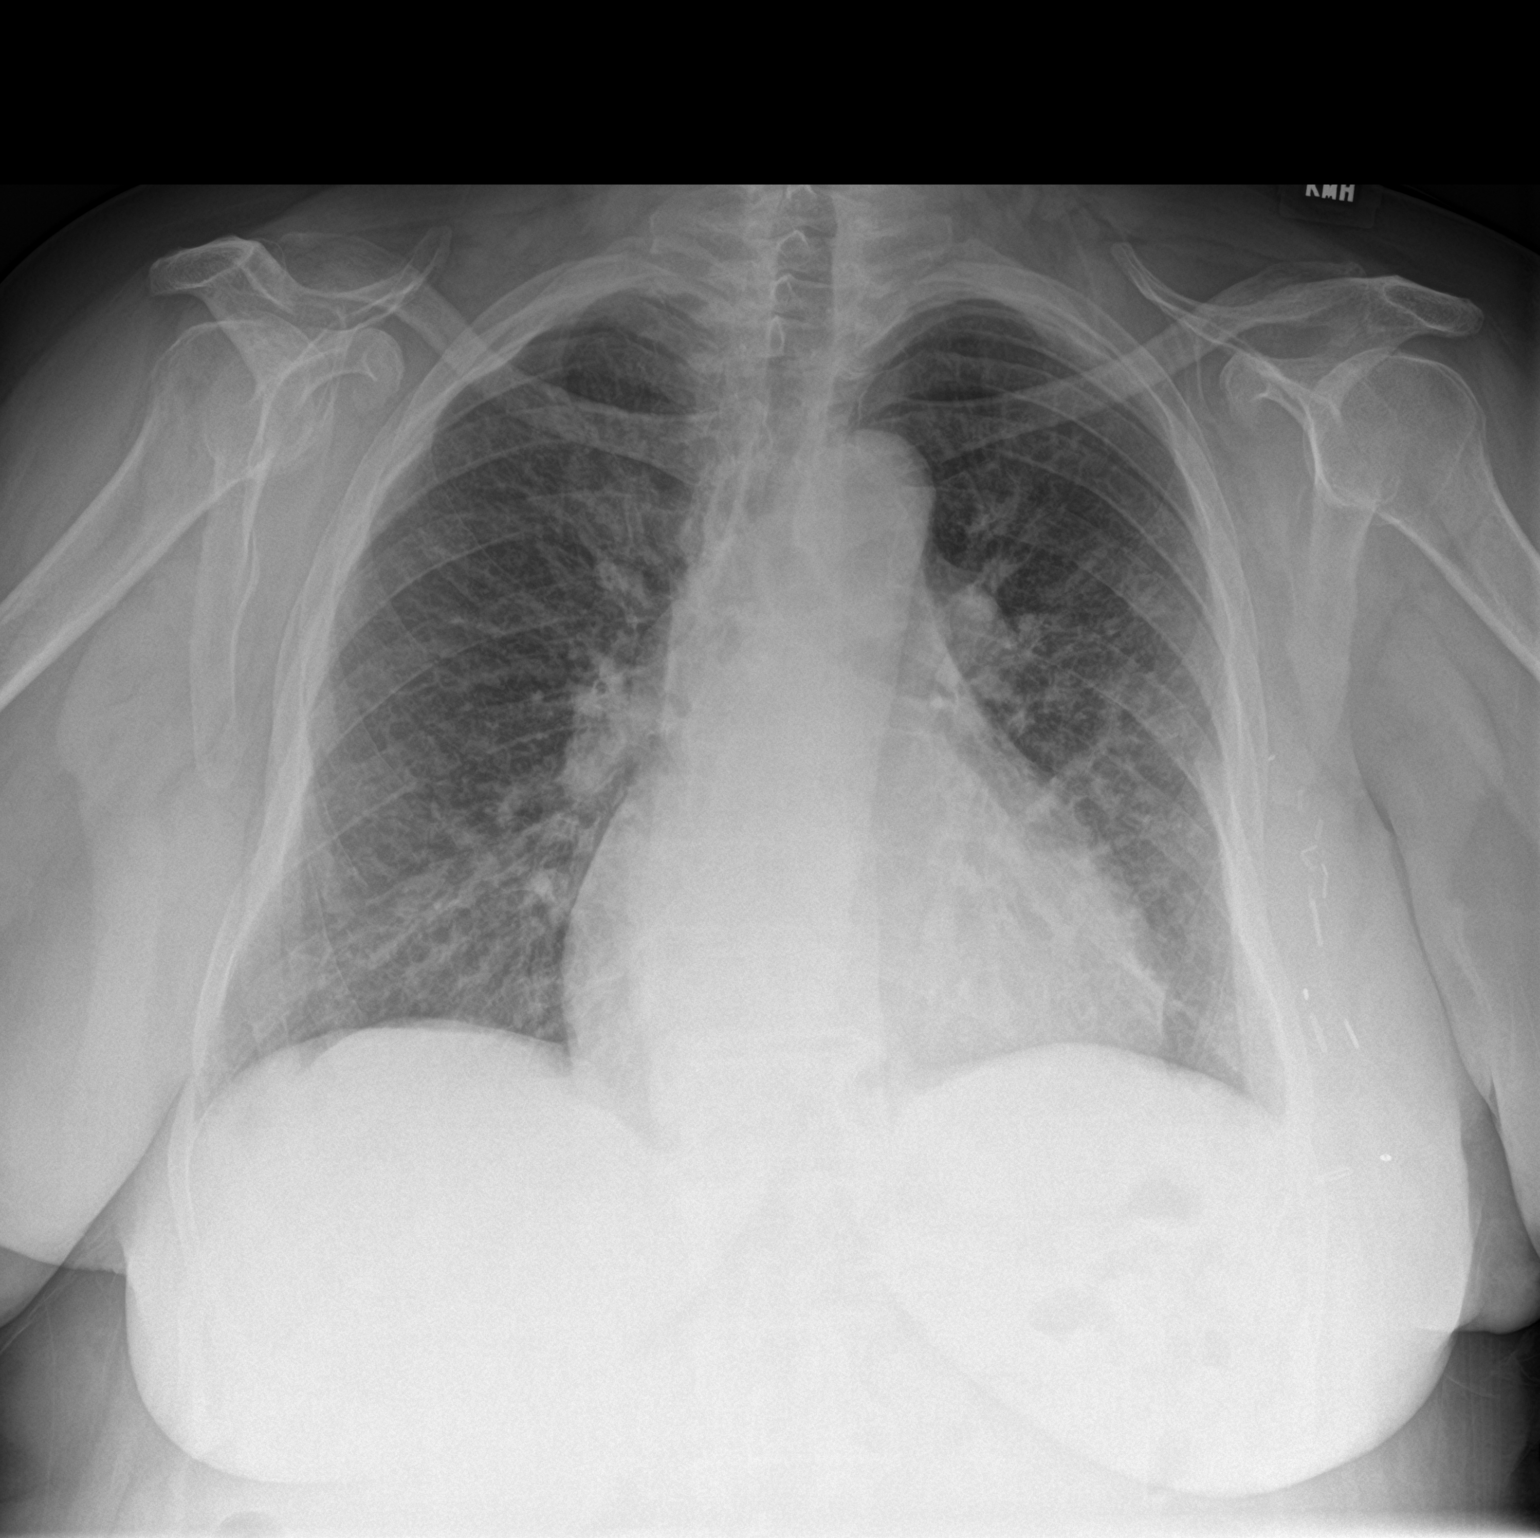

[chest lat]
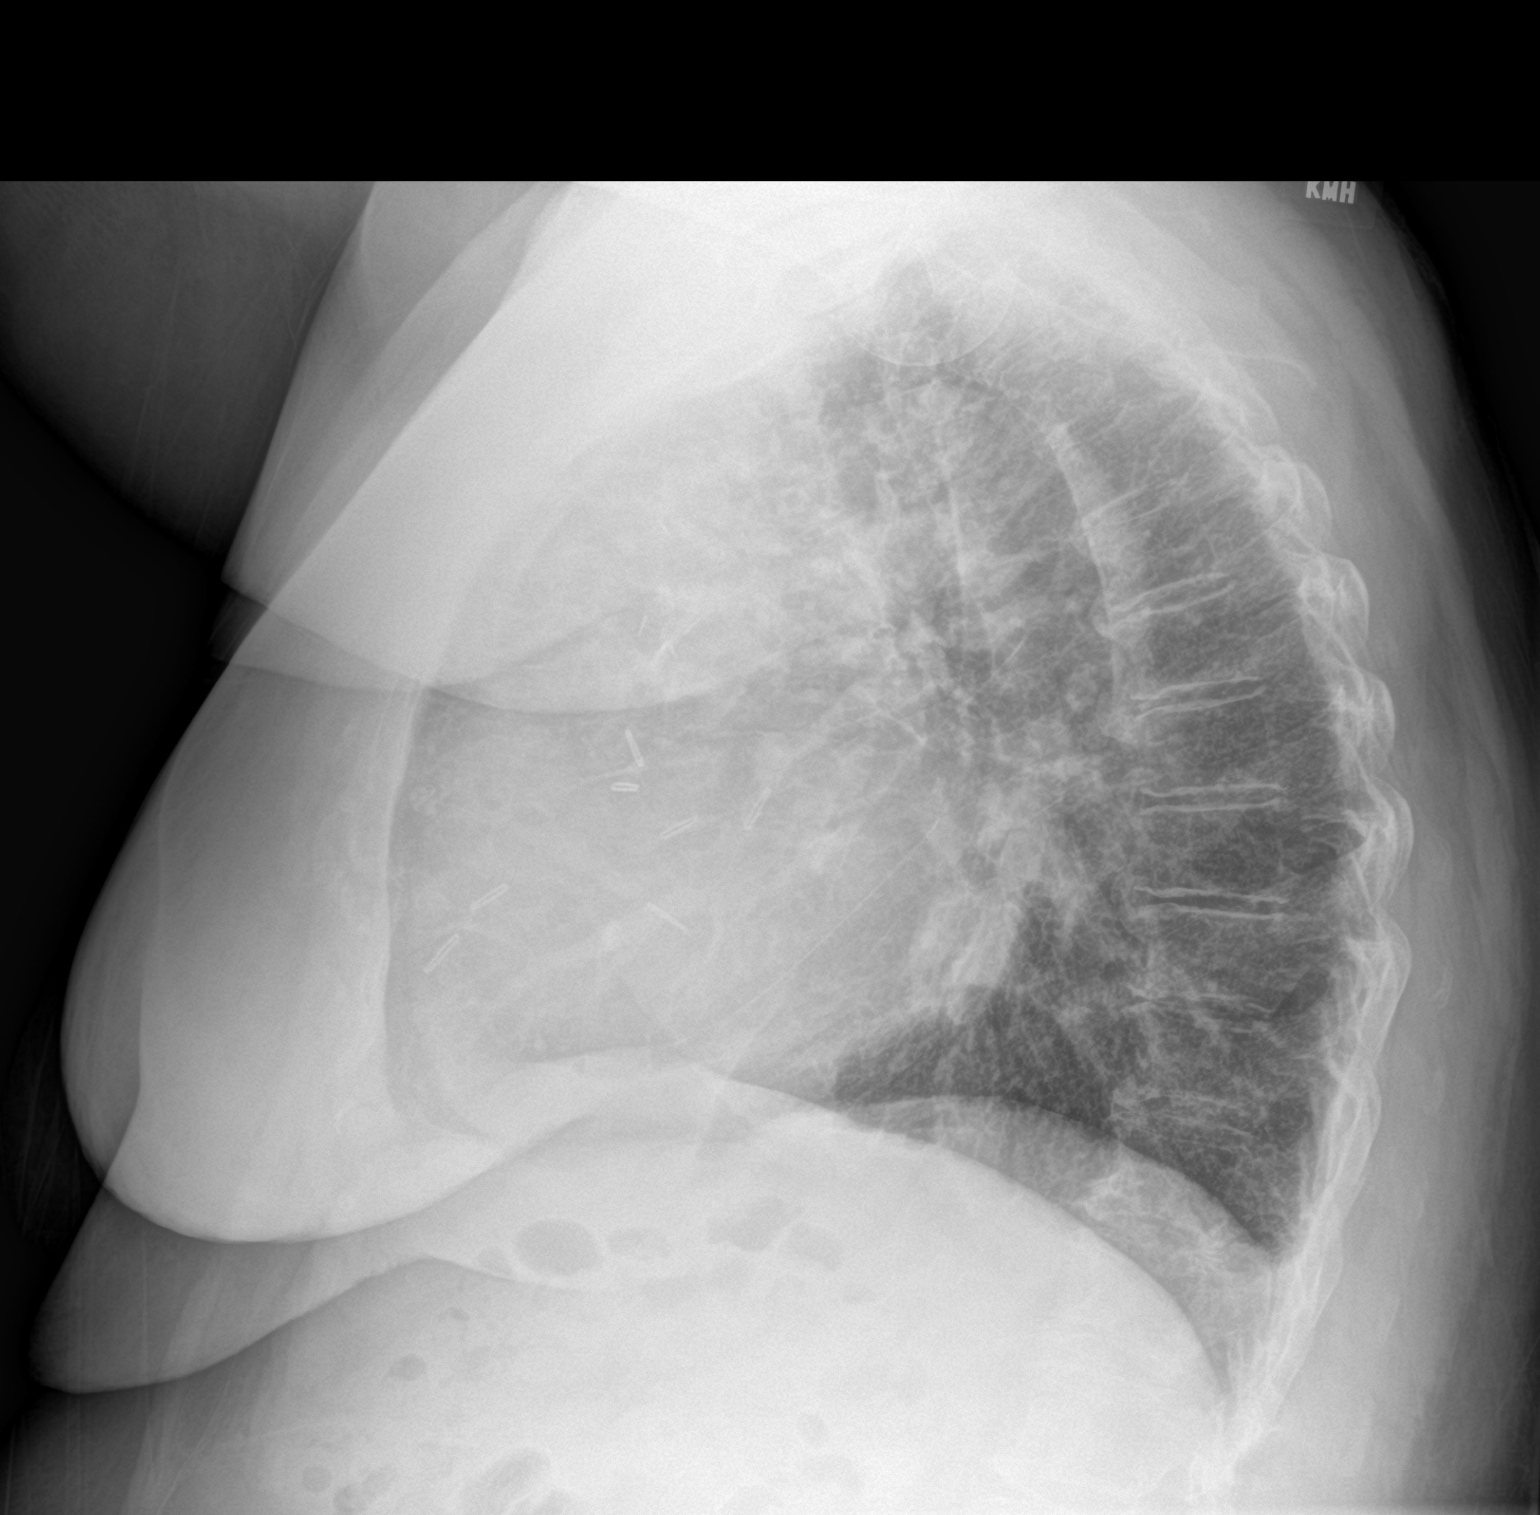

[2 of 2 positions shown; findings below may reference images not displayed]

FINDINGS: Mild interstitial prominence throughout the lungs again noted,
stable. Heart is borderline in size. No effusions. No acute bony
abnormality.
IMPRESSION: Stable mild interstitial prominence within the lungs, left greater
than right. No significant change.

## 2018-06-02 DIAGNOSIS — I890 Lymphedema, not elsewhere classified: Secondary | ICD-10-CM | POA: Diagnosis not present

## 2018-06-03 DIAGNOSIS — I890 Lymphedema, not elsewhere classified: Secondary | ICD-10-CM | POA: Diagnosis not present

## 2018-06-04 DIAGNOSIS — I890 Lymphedema, not elsewhere classified: Secondary | ICD-10-CM | POA: Diagnosis not present

## 2018-06-05 DIAGNOSIS — I890 Lymphedema, not elsewhere classified: Secondary | ICD-10-CM | POA: Diagnosis not present

## 2018-06-06 DIAGNOSIS — I890 Lymphedema, not elsewhere classified: Secondary | ICD-10-CM | POA: Diagnosis not present

## 2018-06-07 DIAGNOSIS — I890 Lymphedema, not elsewhere classified: Secondary | ICD-10-CM | POA: Diagnosis not present

## 2018-06-09 DIAGNOSIS — I890 Lymphedema, not elsewhere classified: Secondary | ICD-10-CM | POA: Diagnosis not present

## 2018-06-10 DIAGNOSIS — I890 Lymphedema, not elsewhere classified: Secondary | ICD-10-CM | POA: Diagnosis not present

## 2018-06-11 DIAGNOSIS — I890 Lymphedema, not elsewhere classified: Secondary | ICD-10-CM | POA: Diagnosis not present

## 2018-06-12 ENCOUNTER — Ambulatory Visit (INDEPENDENT_AMBULATORY_CARE_PROVIDER_SITE_OTHER): Payer: Medicare HMO | Admitting: General Practice

## 2018-06-12 DIAGNOSIS — Z7901 Long term (current) use of anticoagulants: Secondary | ICD-10-CM

## 2018-06-12 DIAGNOSIS — I482 Chronic atrial fibrillation, unspecified: Secondary | ICD-10-CM

## 2018-06-12 LAB — POCT INR: INR: 4.1 — AB (ref 2.0–3.0)

## 2018-06-12 NOTE — Patient Instructions (Addendum)
Pre visit review using our clinic review tool, if applicable. No additional management support is needed unless otherwise documented below in the visit note.  Hold coumadin today and tomorrow (8/6 and 8/7) and then change dosage and take 1 tablet daily except take 1/2 tablet on Tuesdays and Saturdays.  Re-check in 2 weeks per patient request.

## 2018-06-13 DIAGNOSIS — I890 Lymphedema, not elsewhere classified: Secondary | ICD-10-CM | POA: Diagnosis not present

## 2018-06-14 DIAGNOSIS — I890 Lymphedema, not elsewhere classified: Secondary | ICD-10-CM | POA: Diagnosis not present

## 2018-06-15 DIAGNOSIS — I890 Lymphedema, not elsewhere classified: Secondary | ICD-10-CM | POA: Diagnosis not present

## 2018-06-16 DIAGNOSIS — I890 Lymphedema, not elsewhere classified: Secondary | ICD-10-CM | POA: Diagnosis not present

## 2018-06-17 DIAGNOSIS — I890 Lymphedema, not elsewhere classified: Secondary | ICD-10-CM | POA: Diagnosis not present

## 2018-06-18 DIAGNOSIS — I890 Lymphedema, not elsewhere classified: Secondary | ICD-10-CM | POA: Diagnosis not present

## 2018-06-19 DIAGNOSIS — I890 Lymphedema, not elsewhere classified: Secondary | ICD-10-CM | POA: Diagnosis not present

## 2018-06-20 DIAGNOSIS — I890 Lymphedema, not elsewhere classified: Secondary | ICD-10-CM | POA: Diagnosis not present

## 2018-06-21 DIAGNOSIS — I890 Lymphedema, not elsewhere classified: Secondary | ICD-10-CM | POA: Diagnosis not present

## 2018-06-23 DIAGNOSIS — I890 Lymphedema, not elsewhere classified: Secondary | ICD-10-CM | POA: Diagnosis not present

## 2018-06-24 DIAGNOSIS — I890 Lymphedema, not elsewhere classified: Secondary | ICD-10-CM | POA: Diagnosis not present

## 2018-06-25 DIAGNOSIS — I890 Lymphedema, not elsewhere classified: Secondary | ICD-10-CM | POA: Diagnosis not present

## 2018-06-26 DIAGNOSIS — Z6841 Body Mass Index (BMI) 40.0 and over, adult: Secondary | ICD-10-CM | POA: Diagnosis not present

## 2018-06-26 DIAGNOSIS — N184 Chronic kidney disease, stage 4 (severe): Secondary | ICD-10-CM | POA: Diagnosis not present

## 2018-06-26 DIAGNOSIS — C50919 Malignant neoplasm of unspecified site of unspecified female breast: Secondary | ICD-10-CM | POA: Diagnosis not present

## 2018-06-26 DIAGNOSIS — I129 Hypertensive chronic kidney disease with stage 1 through stage 4 chronic kidney disease, or unspecified chronic kidney disease: Secondary | ICD-10-CM | POA: Diagnosis not present

## 2018-06-26 DIAGNOSIS — R600 Localized edema: Secondary | ICD-10-CM | POA: Diagnosis not present

## 2018-06-26 DIAGNOSIS — I890 Lymphedema, not elsewhere classified: Secondary | ICD-10-CM | POA: Diagnosis not present

## 2018-06-27 DIAGNOSIS — I890 Lymphedema, not elsewhere classified: Secondary | ICD-10-CM | POA: Diagnosis not present

## 2018-06-28 DIAGNOSIS — I890 Lymphedema, not elsewhere classified: Secondary | ICD-10-CM | POA: Diagnosis not present

## 2018-06-29 ENCOUNTER — Ambulatory Visit (INDEPENDENT_AMBULATORY_CARE_PROVIDER_SITE_OTHER): Payer: Medicare HMO | Admitting: General Practice

## 2018-06-29 DIAGNOSIS — Z7901 Long term (current) use of anticoagulants: Secondary | ICD-10-CM | POA: Diagnosis not present

## 2018-06-29 DIAGNOSIS — I482 Chronic atrial fibrillation, unspecified: Secondary | ICD-10-CM

## 2018-06-29 DIAGNOSIS — I890 Lymphedema, not elsewhere classified: Secondary | ICD-10-CM | POA: Diagnosis not present

## 2018-06-29 LAB — POCT INR: INR: 2 (ref 2.0–3.0)

## 2018-06-29 IMAGING — US US RENAL
1 series · 14 of 17 positions shown · non-contrast
Comparison: None.

CLINICAL DATA: Acute kidney injury

EXAM:
RENAL / URINARY TRACT ULTRASOUND COMPLETE

[Series 1: us renal · 0.26mm/px · 14 of 17 slices shown]
[im 1/17]
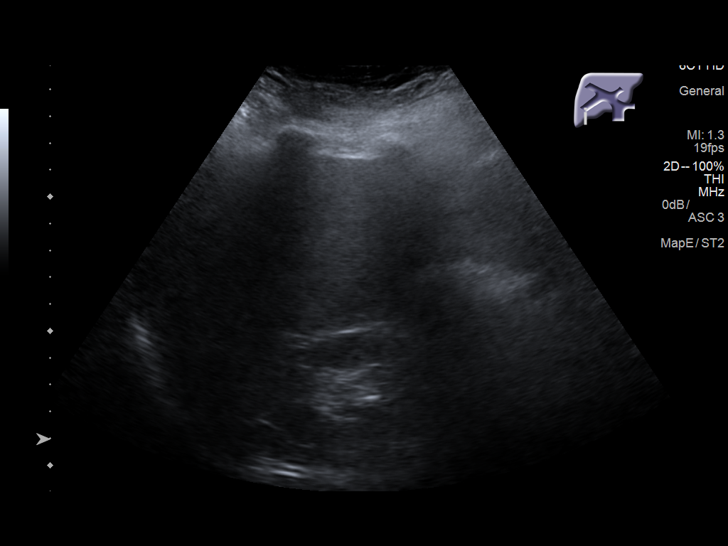
[im 2/17]
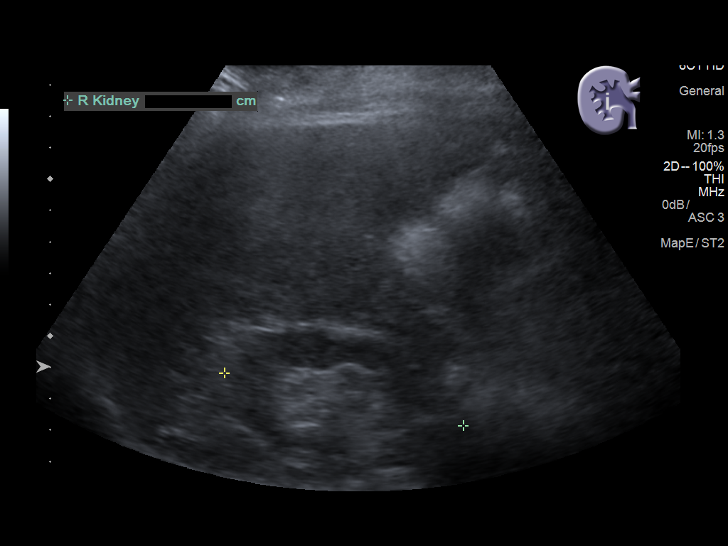
[im 4/17]
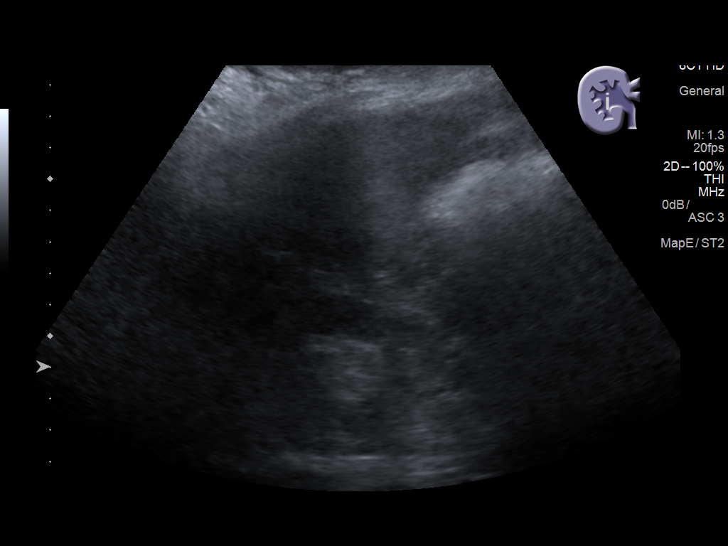
[im 5/17]
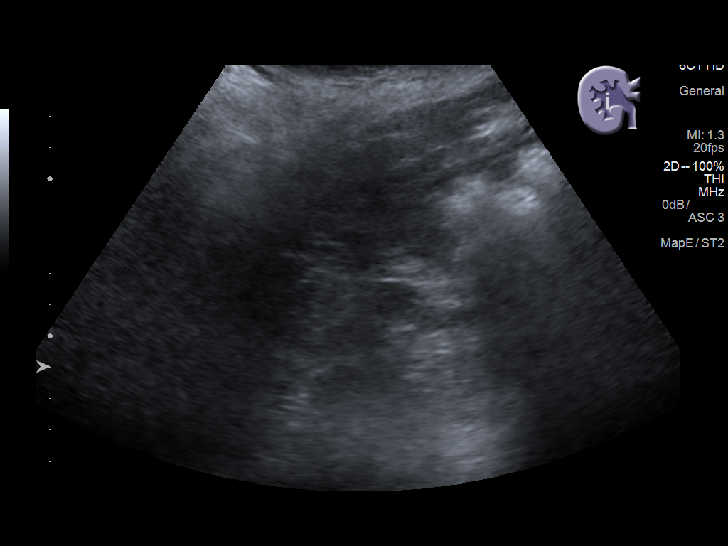
[im 6/17]
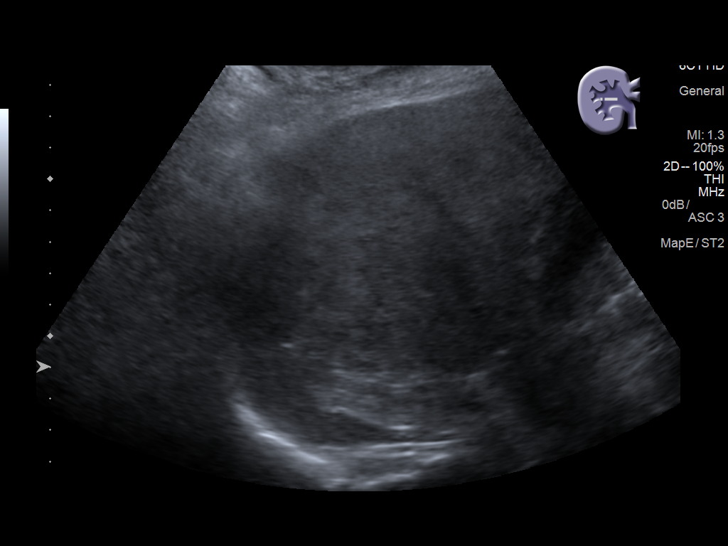
[im 7/17]
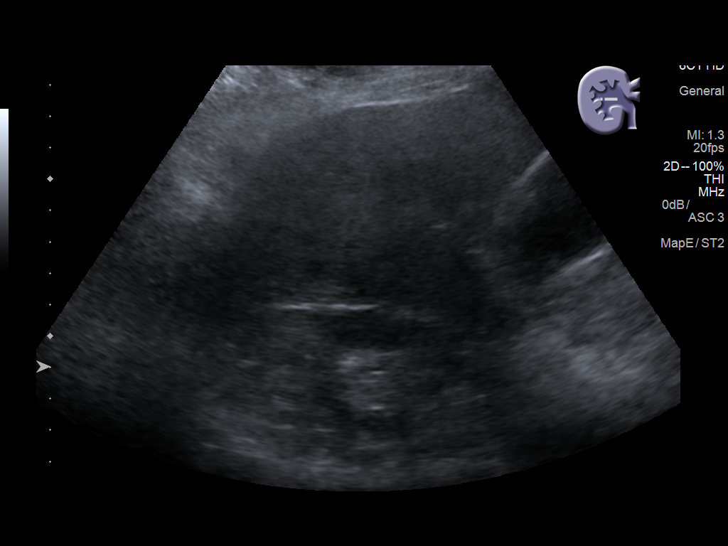
[im 8/17]
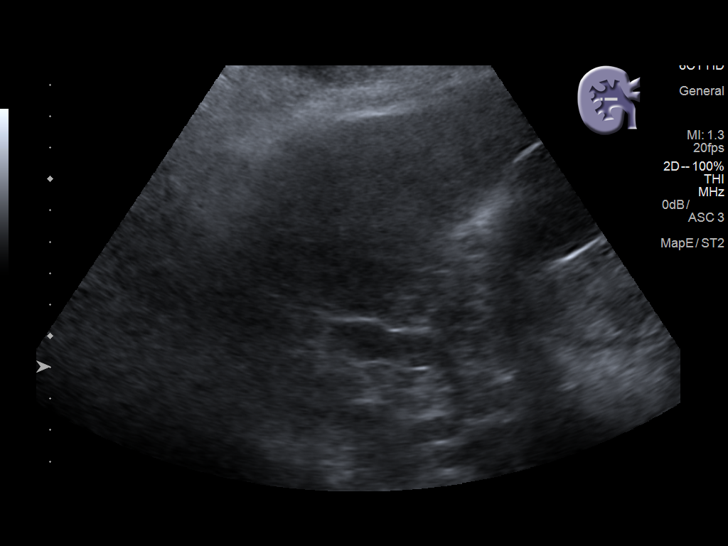
[im 10/17]
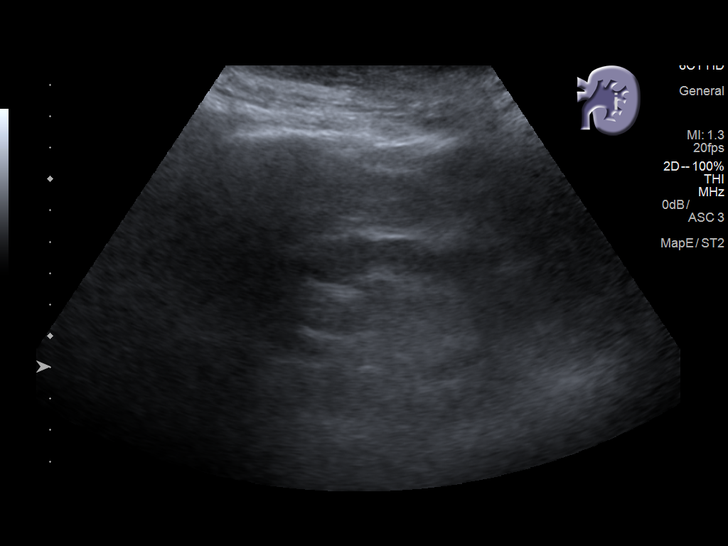
[im 11/17]
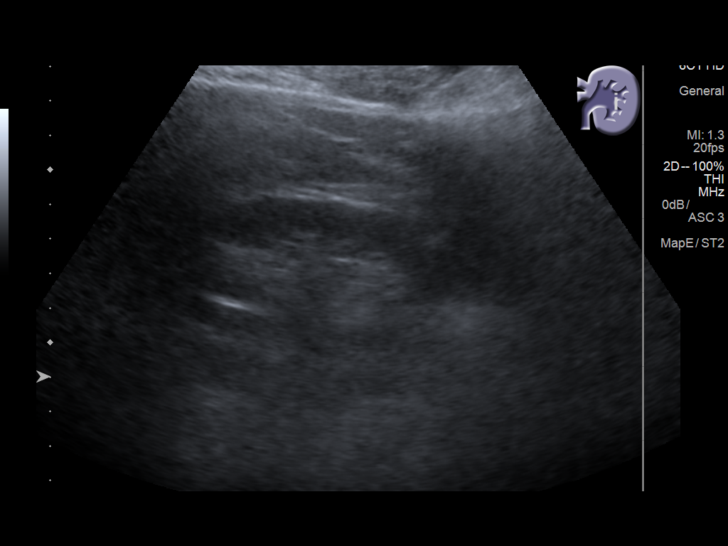
[im 12/17]
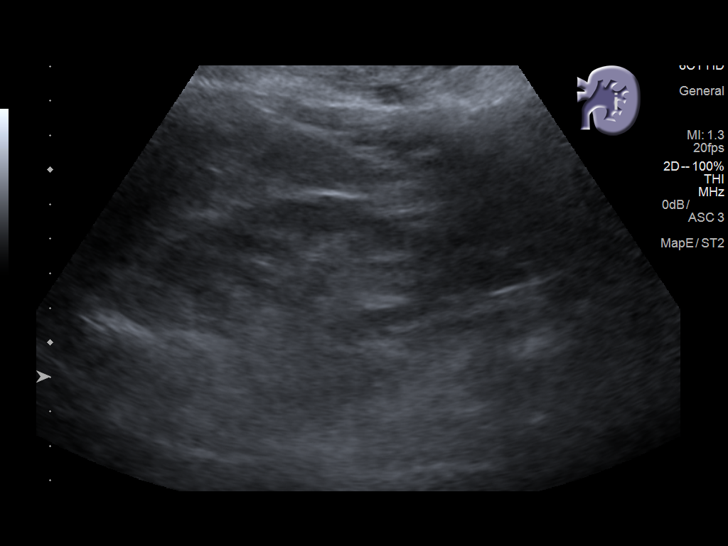
[im 13/17]
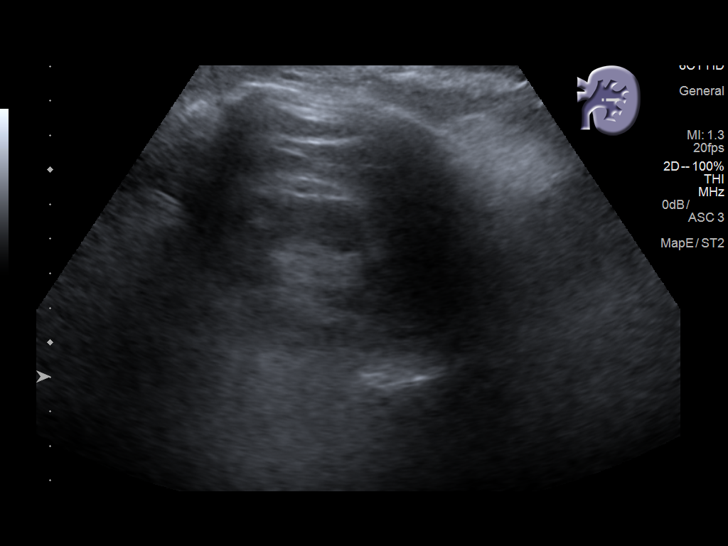
[im 14/17]
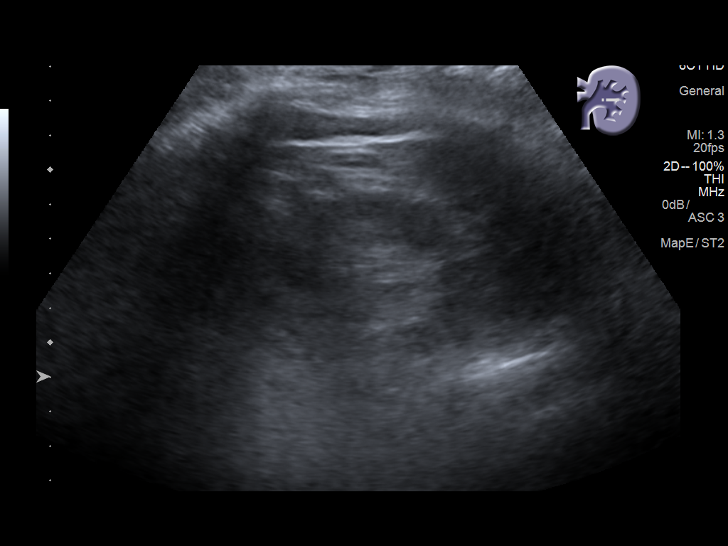
[im 16/17]
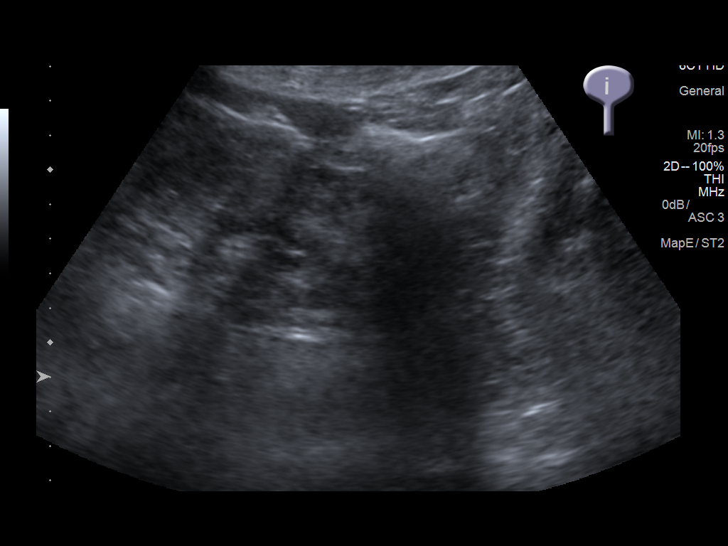
[im 17/17]
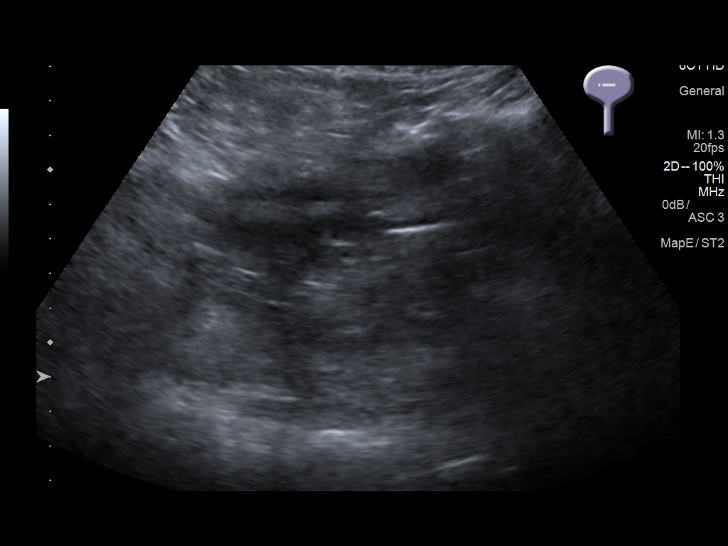

[14 of 17 positions shown; findings below may reference images not displayed]

FINDINGS: Right Kidney:

Length: 7.8 cm. Echogenicity within normal limits. No mass or
hydronephrosis visualized.

Left Kidney:

Length: 8.5 cm. Echogenicity within normal limits. No mass or
hydronephrosis visualized.

Bladder:

Bladder is empty.
IMPRESSION: Difficult visualization of kidneys due to habitus and difficulty
with breath holding. Negative for hydronephrosis. Kidneys are
somewhat small in appearance suggesting mild atrophy.

## 2018-06-29 NOTE — Patient Instructions (Addendum)
Pre visit review using our clinic review tool, if applicable. No additional management support is needed unless otherwise documented below in the visit note.  Continue to take 1 tablet daily except take 1/2 tablet on Tuesdays and Saturdays.  Re-check in 4 weeks.

## 2018-06-30 DIAGNOSIS — I890 Lymphedema, not elsewhere classified: Secondary | ICD-10-CM | POA: Diagnosis not present

## 2018-07-12 DIAGNOSIS — I890 Lymphedema, not elsewhere classified: Secondary | ICD-10-CM | POA: Diagnosis not present

## 2018-07-13 DIAGNOSIS — I890 Lymphedema, not elsewhere classified: Secondary | ICD-10-CM | POA: Diagnosis not present

## 2018-07-14 DIAGNOSIS — I890 Lymphedema, not elsewhere classified: Secondary | ICD-10-CM | POA: Diagnosis not present

## 2018-07-15 DIAGNOSIS — I890 Lymphedema, not elsewhere classified: Secondary | ICD-10-CM | POA: Diagnosis not present

## 2018-07-23 DIAGNOSIS — I890 Lymphedema, not elsewhere classified: Secondary | ICD-10-CM | POA: Diagnosis not present

## 2018-07-24 DIAGNOSIS — I890 Lymphedema, not elsewhere classified: Secondary | ICD-10-CM | POA: Diagnosis not present

## 2018-07-25 DIAGNOSIS — I890 Lymphedema, not elsewhere classified: Secondary | ICD-10-CM | POA: Diagnosis not present

## 2018-07-26 DIAGNOSIS — I890 Lymphedema, not elsewhere classified: Secondary | ICD-10-CM | POA: Diagnosis not present

## 2018-07-27 ENCOUNTER — Ambulatory Visit (INDEPENDENT_AMBULATORY_CARE_PROVIDER_SITE_OTHER): Payer: Medicare HMO | Admitting: General Practice

## 2018-07-27 DIAGNOSIS — I482 Chronic atrial fibrillation, unspecified: Secondary | ICD-10-CM

## 2018-07-27 DIAGNOSIS — Z7901 Long term (current) use of anticoagulants: Secondary | ICD-10-CM | POA: Diagnosis not present

## 2018-07-27 DIAGNOSIS — I890 Lymphedema, not elsewhere classified: Secondary | ICD-10-CM | POA: Diagnosis not present

## 2018-07-27 LAB — POCT INR: INR: 1.8 — AB (ref 2.0–3.0)

## 2018-07-27 NOTE — Patient Instructions (Addendum)
Pre visit review using our clinic review tool, if applicable. No additional management support is needed unless otherwise documented below in the visit note.  Take 1 1/2 tablets today and then continue to take 1 tablet daily except take 1/2 tablet on Tuesdays and Saturdays.  Re-check in 4 weeks.

## 2018-07-28 DIAGNOSIS — I890 Lymphedema, not elsewhere classified: Secondary | ICD-10-CM | POA: Diagnosis not present

## 2018-07-29 DIAGNOSIS — I890 Lymphedema, not elsewhere classified: Secondary | ICD-10-CM | POA: Diagnosis not present

## 2018-07-30 DIAGNOSIS — I890 Lymphedema, not elsewhere classified: Secondary | ICD-10-CM | POA: Diagnosis not present

## 2018-08-01 DIAGNOSIS — I890 Lymphedema, not elsewhere classified: Secondary | ICD-10-CM | POA: Diagnosis not present

## 2018-08-02 DIAGNOSIS — I890 Lymphedema, not elsewhere classified: Secondary | ICD-10-CM | POA: Diagnosis not present

## 2018-08-03 DIAGNOSIS — I890 Lymphedema, not elsewhere classified: Secondary | ICD-10-CM | POA: Diagnosis not present

## 2018-08-04 DIAGNOSIS — I890 Lymphedema, not elsewhere classified: Secondary | ICD-10-CM | POA: Diagnosis not present

## 2018-08-05 DIAGNOSIS — I890 Lymphedema, not elsewhere classified: Secondary | ICD-10-CM | POA: Diagnosis not present

## 2018-08-06 ENCOUNTER — Ambulatory Visit (INDEPENDENT_AMBULATORY_CARE_PROVIDER_SITE_OTHER): Payer: Medicare HMO | Admitting: Internal Medicine

## 2018-08-06 ENCOUNTER — Encounter: Payer: Self-pay | Admitting: Internal Medicine

## 2018-08-06 VITALS — BP 130/88 | HR 81 | Temp 97.8°F | Resp 16 | Wt 274.0 lb

## 2018-08-06 DIAGNOSIS — Z23 Encounter for immunization: Secondary | ICD-10-CM

## 2018-08-06 DIAGNOSIS — I10 Essential (primary) hypertension: Secondary | ICD-10-CM | POA: Diagnosis not present

## 2018-08-06 DIAGNOSIS — R739 Hyperglycemia, unspecified: Secondary | ICD-10-CM

## 2018-08-06 DIAGNOSIS — N184 Chronic kidney disease, stage 4 (severe): Secondary | ICD-10-CM

## 2018-08-06 DIAGNOSIS — I890 Lymphedema, not elsewhere classified: Secondary | ICD-10-CM | POA: Diagnosis not present

## 2018-08-06 DIAGNOSIS — E785 Hyperlipidemia, unspecified: Secondary | ICD-10-CM

## 2018-08-06 MED ORDER — LIDOCAINE 5 % EX PTCH: 1.0000 | MEDICATED_PATCH | CUTANEOUS | 5 refills | Status: DC

## 2018-08-06 NOTE — Progress Notes (Signed)
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 -Continue Exemestane  -Lab and f/u in 6 months -Mammogram 01/2019 -Refilled exemestane    No orders of the defined types were placed in this encounter.   All questions were answered. The patient knows to call the clinic with any problems, questions or concerns. I spent 20 minutes counseling the patient face to face. The total time spent in the appointment was 25 minutes and more than 50% was on counseling.  Dierdre Searles Dweik am acting as scribe for Dr. Truitt Merle.  I have reviewed the above documentation for accuracy and completeness, and I agree with the above.     Truitt Merle, MD 08/09/2018

## 2018-08-06 NOTE — Assessment & Plan Note (Signed)
Lovastatin 

## 2018-08-06 NOTE — Assessment & Plan Note (Signed)
Labs

## 2018-08-06 NOTE — Assessment & Plan Note (Signed)
No NSAIDs - discussed 

## 2018-08-06 NOTE — Assessment & Plan Note (Signed)
Lasix, Avalide, Coreg

## 2018-08-06 NOTE — Progress Notes (Signed)
Subjective:  Patient ID: Katelyn Jackson, female    DOB: [birth year]  Age: 72 y.o. MRN: [id number]  CC: No chief complaint on file.   HPI [name] presents for knee OA, obesity, edema - better, CRF f/u Had labs on 8/20 and labs tomorrow  Outpatient Medications Prior to Visit  Medication Sig Dispense Refill  . carvedilol (COREG) 6.25 MG tablet Take 1 tablet (6.25 mg total) by mouth 2 (two) times daily with a meal. 60 tablet 11  . Cholecalciferol (VITAMIN D3) 2000 units capsule Take 1 capsule (2,000 Units total) by mouth daily. 100 capsule 3  . exemestane (AROMASIN) 25 MG tablet Take 1 tablet (25 mg total) by mouth daily after breakfast. 90 tablet 3  . febuxostat (ULORIC) 40 MG tablet Take 1 tablet (40 mg total) by mouth daily. 90 tablet 3  . furosemide (LASIX) 40 MG tablet Take 1 tablet (40 mg total) by mouth daily as needed. 90 tablet 3  . irbesartan-hydrochlorothiazide (AVALIDE) 150-12.5 MG tablet TAKE 1 TABLET BY MOUTH EVERY DAY 30 tablet 11  . levothyroxine (SYNTHROID, LEVOTHROID) 50 MCG tablet Take 1 tablet (50 mcg total) by mouth daily before breakfast. 90 tablet 3  . lipase/protease/amylase (CREON) 12000 units CPEP capsule Take 1 capsule (12,000 Units total) by mouth daily as needed (stomach problems). 90 capsule 3  . lovastatin (MEVACOR) 20 MG tablet Take 1 tablet (20 mg total) by mouth daily. 90 tablet 3  . methocarbamol (ROBAXIN) 500 MG tablet Take 1 tablet (500 mg total) by mouth 4 (four) times daily. 120 tablet 11  . omeprazole (PRILOSEC) 40 MG capsule Take 1 capsule (40 mg total) by mouth daily as needed (acid reflux). 90 capsule 3  . polyethylene glycol powder (GLYCOLAX/MIRALAX) powder mix 1 capful (17 grams) IN 8 ounces OF liquid EVERY DAY 1581 g 3  . potassium chloride (KLOR-CON) 8 MEQ tablet TAKE 1 TABLET BY MOUTH EVERY DAY 90 tablet 3  . traMADol (ULTRAM) 50 MG tablet Take 1 tablet (50 mg total) by mouth every 6 (six) hours as needed. 28 tablet 0  . VOLTAREN 1 %  GEL apply 4 grams 4 TIMES DAILY AS NEEDED FOR 30 DAYS 100 g 5  . warfarin (COUMADIN) 5 MG tablet Take 5 mg by mouth daily. None on Wed.     No facility-administered medications prior to visit.     ROS: Review of Systems  Constitutional: Negative for activity change, appetite change, chills, fatigue and unexpected weight change.  HENT: Negative for congestion, mouth sores and sinus pressure.   Eyes: Negative for visual disturbance.  Respiratory: Negative for cough and chest tightness.   Cardiovascular: Positive for leg swelling.  Gastrointestinal: Negative for abdominal pain and nausea.  Genitourinary: Negative for difficulty urinating, frequency and vaginal pain.  Musculoskeletal: Positive for arthralgias, back pain and gait problem.  Skin: Negative for pallor and rash.  Neurological: Negative for dizziness, tremors, weakness, numbness and headaches.  Psychiatric/Behavioral: Negative for confusion, sleep disturbance and suicidal ideas. The patient is nervous/anxious.     Objective:  BP 130/88   Pulse 81   Temp 97.8 F (36.6 C) (Oral)   Resp 16   Wt 274 lb (124.3 kg)   LMP  (LMP Unknown)   SpO2 98%   BMI 51.77 kg/m   BP Readings from Last 3 Encounters:  08/06/18 130/88  04/23/18 132/82  03/31/18 (!) 150/80    Wt Readings from Last 3 Encounters:  08/06/18 274 lb (124.3 kg)  04/23/18 284 lb (128.8  kg)  03/31/18 278 lb (126.1 kg)    Physical Exam  Constitutional: She appears well-developed. No distress.  HENT:  Head: Normocephalic.  Right Ear: External ear normal.  Left Ear: External ear normal.  Nose: Nose normal.  Mouth/Throat: Oropharynx is clear and moist.  Eyes: Pupils are equal, round, and reactive to light. Conjunctivae are normal. Right eye exhibits no discharge. Left eye exhibits no discharge.  Neck: Normal range of motion. Neck supple. No JVD present. No tracheal deviation present. No thyromegaly present.  Cardiovascular: Normal rate, regular rhythm and  normal heart sounds.  Pulmonary/Chest: No stridor. No respiratory distress. She has no wheezes.  Abdominal: Soft. Bowel sounds are normal. She exhibits no distension and no mass. There is no tenderness. There is no rebound and no guarding.  Musculoskeletal: She exhibits tenderness. She exhibits no edema.  Lymphadenopathy:    She has no cervical adenopathy.  Neurological: She displays normal reflexes. No cranial nerve deficit. She exhibits normal muscle tone. Coordination normal.  Skin: No rash noted. No erythema.  Psychiatric: She has a normal mood and affect. Her behavior is normal. Judgment and thought content normal.  obese Non-pitting LE edema  Lab Results  Component Value Date   WBC 5.5 02/07/2018   HGB 11.1 (L) 02/07/2018   HCT 36.1 02/07/2018   PLT 204 02/07/2018   GLUCOSE 87 02/07/2018   ALT 36 02/07/2018   AST 25 02/07/2018   NA 137 02/07/2018   K 4.0 02/07/2018   CL 103 02/07/2018   CREATININE 2.00 (H) 02/07/2018   BUN 51 (H) 02/07/2018   CO2 25 02/07/2018   TSH 6.26 (H) 01/05/2018   INR 1.8 (A) 07/27/2018   HGBA1C 5.5 01/05/2018    No results found.  Assessment & Plan:   Diagnoses and all orders for this visit:  Need for influenza vaccination -     Flu vaccine HIGH DOSE PF (Fluzone High Dose)     No orders of the defined types were placed in this encounter.    Follow-up: No follow-ups on file.  Walker Kehr, MD

## 2018-08-06 NOTE — Assessment & Plan Note (Signed)
Better  

## 2018-08-07 DIAGNOSIS — I890 Lymphedema, not elsewhere classified: Secondary | ICD-10-CM | POA: Diagnosis not present

## 2018-08-08 ENCOUNTER — Telehealth: Payer: Self-pay | Admitting: Internal Medicine

## 2018-08-08 DIAGNOSIS — I890 Lymphedema, not elsewhere classified: Secondary | ICD-10-CM | POA: Diagnosis not present

## 2018-08-08 NOTE — Telephone Encounter (Signed)
Copied from [place] [id number]. Topic: General - Other >> Aug 08, 2018  2:38 PM Janace Aris A wrote: Reason for CRM: patient's son called in wanting to have all of his mother's medications that were prescribed by her PCP to be transferred over to a new pharmacy that they are now using.

## 2018-08-09 ENCOUNTER — Inpatient Hospital Stay: Payer: Medicare HMO

## 2018-08-09 ENCOUNTER — Encounter: Payer: Self-pay | Admitting: Hematology

## 2018-08-09 ENCOUNTER — Telehealth: Payer: Self-pay

## 2018-08-09 ENCOUNTER — Inpatient Hospital Stay: Payer: Medicare HMO | Attending: Hematology | Admitting: Hematology

## 2018-08-09 ENCOUNTER — Telehealth: Payer: Self-pay | Admitting: Internal Medicine

## 2018-08-09 VITALS — BP 145/88 | HR 72 | Temp 97.8°F | Resp 17 | Ht 61.0 in | Wt 272.8 lb

## 2018-08-09 DIAGNOSIS — M1990 Unspecified osteoarthritis, unspecified site: Secondary | ICD-10-CM | POA: Diagnosis not present

## 2018-08-09 DIAGNOSIS — D631 Anemia in chronic kidney disease: Secondary | ICD-10-CM | POA: Insufficient documentation

## 2018-08-09 DIAGNOSIS — I482 Chronic atrial fibrillation, unspecified: Secondary | ICD-10-CM | POA: Diagnosis not present

## 2018-08-09 DIAGNOSIS — J811 Chronic pulmonary edema: Secondary | ICD-10-CM | POA: Insufficient documentation

## 2018-08-09 DIAGNOSIS — I129 Hypertensive chronic kidney disease with stage 1 through stage 4 chronic kidney disease, or unspecified chronic kidney disease: Secondary | ICD-10-CM | POA: Insufficient documentation

## 2018-08-09 DIAGNOSIS — Z79811 Long term (current) use of aromatase inhibitors: Secondary | ICD-10-CM | POA: Diagnosis not present

## 2018-08-09 DIAGNOSIS — Z79899 Other long term (current) drug therapy: Secondary | ICD-10-CM

## 2018-08-09 DIAGNOSIS — Z170 Estrogen receptor positive status [ER+]: Secondary | ICD-10-CM | POA: Diagnosis not present

## 2018-08-09 DIAGNOSIS — C50412 Malignant neoplasm of upper-outer quadrant of left female breast: Secondary | ICD-10-CM

## 2018-08-09 DIAGNOSIS — Z510 Encounter for antineoplastic radiation therapy: Secondary | ICD-10-CM | POA: Diagnosis not present

## 2018-08-09 DIAGNOSIS — M8580 Other specified disorders of bone density and structure, unspecified site: Secondary | ICD-10-CM | POA: Diagnosis not present

## 2018-08-09 DIAGNOSIS — D638 Anemia in other chronic diseases classified elsewhere: Secondary | ICD-10-CM

## 2018-08-09 DIAGNOSIS — N189 Chronic kidney disease, unspecified: Secondary | ICD-10-CM | POA: Diagnosis not present

## 2018-08-09 LAB — CBC WITH DIFFERENTIAL (CANCER CENTER ONLY)
BASOS ABS: 0.1 10*3/uL (ref 0.0–0.1)
Basophils Relative: 1 %
Eosinophils Absolute: 0.6 10*3/uL — ABNORMAL HIGH (ref 0.0–0.5)
Eosinophils Relative: 10 %
HEMATOCRIT: 32.5 % — AB (ref 34.8–46.6)
HEMOGLOBIN: 10.3 g/dL — AB (ref 11.6–15.9)
LYMPHS ABS: 1.5 10*3/uL (ref 0.9–3.3)
LYMPHS PCT: 26 %
MCH: 30 pg (ref 25.1–34.0)
MCHC: 31.7 g/dL (ref 31.5–36.0)
MCV: 94.8 fL (ref 79.5–101.0)
Monocytes Absolute: 0.9 10*3/uL (ref 0.1–0.9)
Monocytes Relative: 16 %
NEUTROS ABS: 2.8 10*3/uL (ref 1.5–6.5)
Neutrophils Relative %: 47 %
Platelet Count: 217 10*3/uL (ref 145–400)
RBC: 3.43 MIL/uL — AB (ref 3.70–5.45)
RDW: 13.3 % (ref 11.2–14.5)
WBC: 5.8 10*3/uL (ref 3.9–10.3)

## 2018-08-09 LAB — RETICULOCYTES
RBC.: 3.43 MIL/uL — ABNORMAL LOW (ref 3.70–5.45)
RETIC COUNT ABSOLUTE: 51.5 10*3/uL (ref 33.7–90.7)
Retic Ct Pct: 1.5 % (ref 0.7–2.1)

## 2018-08-09 LAB — COMPREHENSIVE METABOLIC PANEL
ALT: 13 U/L (ref 0–44)
ANION GAP: 11 (ref 5–15)
AST: 16 U/L (ref 15–41)
Albumin: 3.8 g/dL (ref 3.5–5.0)
Alkaline Phosphatase: 146 U/L — ABNORMAL HIGH (ref 38–126)
BUN: 66 mg/dL — AB (ref 8–23)
CHLORIDE: 97 mmol/L — AB (ref 98–111)
CO2: 27 mmol/L (ref 22–32)
Calcium: 10.2 mg/dL (ref 8.9–10.3)
Creatinine, Ser: 2.34 mg/dL — ABNORMAL HIGH (ref 0.44–1.00)
GFR calc Af Amer: 23 mL/min — ABNORMAL LOW (ref >60)
GFR, EST NON AFRICAN AMERICAN: 20 mL/min — AB (ref >60)
Glucose, Bld: 95 mg/dL (ref 70–99)
POTASSIUM: 3.7 mmol/L (ref 3.5–5.1)
Sodium: 135 mmol/L (ref 135–145)
TOTAL PROTEIN: 7.6 g/dL (ref 6.5–8.1)
Total Bilirubin: 0.7 mg/dL (ref 0.3–1.2)

## 2018-08-09 LAB — IRON AND TIBC
Iron: 57 ug/dL (ref 41–142)
SATURATION RATIOS: 16 % — AB (ref 21–57)
TIBC: 361 ug/dL (ref 236–444)
UIBC: 304 ug/dL

## 2018-08-09 LAB — FERRITIN: Ferritin: 88 ng/mL (ref 11–307)

## 2018-08-09 MED ORDER — LOVASTATIN 20 MG PO TABS: 20.0000 mg | ORAL_TABLET | Freq: Every day | ORAL | 5 refills | Status: DC

## 2018-08-09 MED ORDER — EXEMESTANE 25 MG PO TABS: 25.0000 mg | ORAL_TABLET | Freq: Every day | ORAL | 3 refills | Status: DC

## 2018-08-09 MED ORDER — IRBESARTAN-HYDROCHLOROTHIAZIDE 150-12.5 MG PO TABS: 1.0000 | ORAL_TABLET | Freq: Every day | ORAL | 5 refills | Status: DC

## 2018-08-09 MED ORDER — FEBUXOSTAT 40 MG PO TABS: 40.0000 mg | ORAL_TABLET | Freq: Every day | ORAL | 5 refills | Status: DC

## 2018-08-09 MED ORDER — OMEPRAZOLE 40 MG PO CPDR: 40.0000 mg | DELAYED_RELEASE_CAPSULE | Freq: Every day | ORAL | 2 refills | Status: DC | PRN

## 2018-08-09 MED ORDER — POTASSIUM CHLORIDE ER 8 MEQ PO TBCR: 8.0000 meq | EXTENDED_RELEASE_TABLET | Freq: Every day | ORAL | 5 refills | Status: DC

## 2018-08-09 MED ORDER — VOLTAREN 1 % TD GEL: TRANSDERMAL | 5 refills | Status: DC

## 2018-08-09 MED ORDER — CARVEDILOL 6.25 MG PO TABS: 6.2500 mg | ORAL_TABLET | Freq: Two times a day (BID) | ORAL | 5 refills | Status: DC

## 2018-08-09 MED ORDER — METHOCARBAMOL 500 MG PO TABS: 500.0000 mg | ORAL_TABLET | Freq: Four times a day (QID) | ORAL | 5 refills | Status: DC

## 2018-08-09 MED ORDER — FUROSEMIDE 40 MG PO TABS: 40.0000 mg | ORAL_TABLET | Freq: Every day | ORAL | 2 refills | Status: DC | PRN

## 2018-08-09 MED ORDER — PANCRELIPASE (LIP-PROT-AMYL) 12000-38000 UNITS PO CPEP: 12000.0000 [IU] | ORAL_CAPSULE | Freq: Every day | ORAL | 1 refills | Status: DC | PRN

## 2018-08-09 MED ORDER — POLYETHYLENE GLYCOL 3350 17 GM/SCOOP PO POWD: ORAL | 3 refills | Status: DC

## 2018-08-09 NOTE — Addendum Note (Signed)
Addended by: Earnstine Regal on: 08/09/2018 09:48 AM   Modules accepted: Orders

## 2018-08-09 NOTE — Telephone Encounter (Signed)
Patients daughter has came in stating that Dr. [name] wrote a prescription for pain patches? On Monday.  States that this RX needs a PA processed.

## 2018-08-09 NOTE — Telephone Encounter (Signed)
Called pt son to verify new pharmacy. Pt is now using [facility]. Updated pharmacy inform will resend to new pharmacy.Marland KitchenJohny Jackson

## 2018-08-09 NOTE — Telephone Encounter (Signed)
Printed avs and calender of upcoming appointment. Per 1/13 los 

## 2018-08-10 ENCOUNTER — Encounter: Payer: Self-pay | Admitting: Hematology

## 2018-08-10 DIAGNOSIS — I890 Lymphedema, not elsewhere classified: Secondary | ICD-10-CM | POA: Diagnosis not present

## 2018-08-10 NOTE — Telephone Encounter (Signed)
Key: [id number]

## 2018-08-11 DIAGNOSIS — I890 Lymphedema, not elsewhere classified: Secondary | ICD-10-CM | POA: Diagnosis not present

## 2018-08-12 DIAGNOSIS — I890 Lymphedema, not elsewhere classified: Secondary | ICD-10-CM | POA: Diagnosis not present

## 2018-08-13 ENCOUNTER — Other Ambulatory Visit: Payer: Self-pay | Admitting: Internal Medicine

## 2018-08-13 ENCOUNTER — Telehealth: Payer: Self-pay

## 2018-08-13 DIAGNOSIS — I890 Lymphedema, not elsewhere classified: Secondary | ICD-10-CM | POA: Diagnosis not present

## 2018-08-13 NOTE — Telephone Encounter (Signed)
-----   Message from Truitt Merle, MD sent at 08/12/2018 10:45 PM EDT ----- Please let pt know her iron study results, overall good, continue oral iron, thanks   Truitt Merle  08/12/2018

## 2018-08-13 NOTE — Telephone Encounter (Signed)
Left voice message per Dr. Burr Medico notified iron study results, overall good, instructed to continue oral iron.

## 2018-08-14 ENCOUNTER — Other Ambulatory Visit: Payer: Self-pay | Admitting: Internal Medicine

## 2018-08-14 DIAGNOSIS — I890 Lymphedema, not elsewhere classified: Secondary | ICD-10-CM | POA: Diagnosis not present

## 2018-08-14 MED ORDER — LEVOTHYROXINE SODIUM 50 MCG PO TABS
50.0000 ug | ORAL_TABLET | Freq: Every day | ORAL | 3 refills | Status: DC
Start: 2018-08-14 — End: 2019-07-16

## 2018-08-14 NOTE — Telephone Encounter (Signed)
Copied from [place] [id number]. Topic: General - Other >> Aug 14, 2018  2:15 PM Lennox Solders wrote: Reason for CRM: pt is out of warfarin and levothyroxine. Pt needs #90 w/refills sent to [facility] in [place]

## 2018-08-14 NOTE — Telephone Encounter (Signed)
Please advise about warfarin.

## 2018-08-15 DIAGNOSIS — I890 Lymphedema, not elsewhere classified: Secondary | ICD-10-CM | POA: Diagnosis not present

## 2018-08-16 DIAGNOSIS — I890 Lymphedema, not elsewhere classified: Secondary | ICD-10-CM | POA: Diagnosis not present

## 2018-08-17 MED ORDER — WARFARIN SODIUM 5 MG PO TABS: 5.0000 mg | ORAL_TABLET | Freq: Every day | ORAL | 5 refills | Status: DC

## 2018-08-18 DIAGNOSIS — I890 Lymphedema, not elsewhere classified: Secondary | ICD-10-CM | POA: Diagnosis not present

## 2018-08-19 DIAGNOSIS — I890 Lymphedema, not elsewhere classified: Secondary | ICD-10-CM | POA: Diagnosis not present

## 2018-08-21 DIAGNOSIS — I890 Lymphedema, not elsewhere classified: Secondary | ICD-10-CM | POA: Diagnosis not present

## 2018-08-22 DIAGNOSIS — I890 Lymphedema, not elsewhere classified: Secondary | ICD-10-CM | POA: Diagnosis not present

## 2018-08-23 DIAGNOSIS — I890 Lymphedema, not elsewhere classified: Secondary | ICD-10-CM | POA: Diagnosis not present

## 2018-08-24 ENCOUNTER — Ambulatory Visit (INDEPENDENT_AMBULATORY_CARE_PROVIDER_SITE_OTHER): Payer: Medicare HMO | Admitting: General Practice

## 2018-08-24 DIAGNOSIS — Z7901 Long term (current) use of anticoagulants: Secondary | ICD-10-CM | POA: Diagnosis not present

## 2018-08-24 DIAGNOSIS — I890 Lymphedema, not elsewhere classified: Secondary | ICD-10-CM | POA: Diagnosis not present

## 2018-08-24 DIAGNOSIS — I482 Chronic atrial fibrillation, unspecified: Secondary | ICD-10-CM

## 2018-08-24 LAB — POCT INR: INR: 3.7 — AB (ref 2.0–3.0)

## 2018-08-24 NOTE — Patient Instructions (Addendum)
Pre visit review using our clinic review tool, if applicable. No additional management support is needed unless otherwise documented below in the visit note.  Hold coumadin today and then continue to take 1 tablet daily except take 1/2 tablet on Tuesdays and Saturdays.  Re-check in 3 to 4 weeks.

## 2018-08-25 DIAGNOSIS — I890 Lymphedema, not elsewhere classified: Secondary | ICD-10-CM | POA: Diagnosis not present

## 2018-08-26 DIAGNOSIS — I890 Lymphedema, not elsewhere classified: Secondary | ICD-10-CM | POA: Diagnosis not present

## 2018-08-27 DIAGNOSIS — I890 Lymphedema, not elsewhere classified: Secondary | ICD-10-CM | POA: Diagnosis not present

## 2018-08-28 DIAGNOSIS — I890 Lymphedema, not elsewhere classified: Secondary | ICD-10-CM | POA: Diagnosis not present

## 2018-08-29 DIAGNOSIS — I890 Lymphedema, not elsewhere classified: Secondary | ICD-10-CM | POA: Diagnosis not present

## 2018-08-30 DIAGNOSIS — I890 Lymphedema, not elsewhere classified: Secondary | ICD-10-CM | POA: Diagnosis not present

## 2018-08-31 DIAGNOSIS — I890 Lymphedema, not elsewhere classified: Secondary | ICD-10-CM | POA: Diagnosis not present

## 2018-09-01 DIAGNOSIS — I890 Lymphedema, not elsewhere classified: Secondary | ICD-10-CM | POA: Diagnosis not present

## 2018-09-02 DIAGNOSIS — I890 Lymphedema, not elsewhere classified: Secondary | ICD-10-CM | POA: Diagnosis not present

## 2018-09-03 DIAGNOSIS — I890 Lymphedema, not elsewhere classified: Secondary | ICD-10-CM | POA: Diagnosis not present

## 2018-09-04 DIAGNOSIS — I890 Lymphedema, not elsewhere classified: Secondary | ICD-10-CM | POA: Diagnosis not present

## 2018-09-05 ENCOUNTER — Other Ambulatory Visit: Payer: Self-pay | Admitting: Internal Medicine

## 2018-09-05 DIAGNOSIS — I890 Lymphedema, not elsewhere classified: Secondary | ICD-10-CM | POA: Diagnosis not present

## 2018-09-06 DIAGNOSIS — I890 Lymphedema, not elsewhere classified: Secondary | ICD-10-CM | POA: Diagnosis not present

## 2018-09-08 DIAGNOSIS — I890 Lymphedema, not elsewhere classified: Secondary | ICD-10-CM | POA: Diagnosis not present

## 2018-09-09 DIAGNOSIS — I890 Lymphedema, not elsewhere classified: Secondary | ICD-10-CM | POA: Diagnosis not present

## 2018-09-10 DIAGNOSIS — I890 Lymphedema, not elsewhere classified: Secondary | ICD-10-CM | POA: Diagnosis not present

## 2018-09-11 DIAGNOSIS — I890 Lymphedema, not elsewhere classified: Secondary | ICD-10-CM | POA: Diagnosis not present

## 2018-09-12 DIAGNOSIS — I890 Lymphedema, not elsewhere classified: Secondary | ICD-10-CM | POA: Diagnosis not present

## 2018-09-14 ENCOUNTER — Ambulatory Visit: Payer: Medicare HMO

## 2018-09-15 DIAGNOSIS — I890 Lymphedema, not elsewhere classified: Secondary | ICD-10-CM | POA: Diagnosis not present

## 2018-09-16 DIAGNOSIS — I890 Lymphedema, not elsewhere classified: Secondary | ICD-10-CM | POA: Diagnosis not present

## 2018-09-17 DIAGNOSIS — I890 Lymphedema, not elsewhere classified: Secondary | ICD-10-CM | POA: Diagnosis not present

## 2018-09-18 ENCOUNTER — Ambulatory Visit (INDEPENDENT_AMBULATORY_CARE_PROVIDER_SITE_OTHER): Payer: Medicare HMO | Admitting: General Practice

## 2018-09-18 DIAGNOSIS — Z7901 Long term (current) use of anticoagulants: Secondary | ICD-10-CM | POA: Diagnosis not present

## 2018-09-18 DIAGNOSIS — I482 Chronic atrial fibrillation, unspecified: Secondary | ICD-10-CM

## 2018-09-18 DIAGNOSIS — I890 Lymphedema, not elsewhere classified: Secondary | ICD-10-CM | POA: Diagnosis not present

## 2018-09-18 LAB — POCT INR: INR: 3 (ref 2.0–3.0)

## 2018-09-18 NOTE — Patient Instructions (Signed)
Pre visit review using our clinic review tool, if applicable. No additional management support is needed unless otherwise documented below in the visit note.  Continue to take 1 tablet daily except take 1/2 tablet on Tuesdays and Saturdays.  Re-check in 3 to 4 weeks.

## 2018-09-19 DIAGNOSIS — I890 Lymphedema, not elsewhere classified: Secondary | ICD-10-CM | POA: Diagnosis not present

## 2018-09-21 DIAGNOSIS — I890 Lymphedema, not elsewhere classified: Secondary | ICD-10-CM | POA: Diagnosis not present

## 2018-09-22 DIAGNOSIS — I890 Lymphedema, not elsewhere classified: Secondary | ICD-10-CM | POA: Diagnosis not present

## 2018-09-23 DIAGNOSIS — I890 Lymphedema, not elsewhere classified: Secondary | ICD-10-CM | POA: Diagnosis not present

## 2018-09-24 DIAGNOSIS — I890 Lymphedema, not elsewhere classified: Secondary | ICD-10-CM | POA: Diagnosis not present

## 2018-09-25 DIAGNOSIS — I890 Lymphedema, not elsewhere classified: Secondary | ICD-10-CM | POA: Diagnosis not present

## 2018-09-28 DIAGNOSIS — I890 Lymphedema, not elsewhere classified: Secondary | ICD-10-CM | POA: Diagnosis not present

## 2018-09-29 DIAGNOSIS — I890 Lymphedema, not elsewhere classified: Secondary | ICD-10-CM | POA: Diagnosis not present

## 2018-09-30 DIAGNOSIS — I890 Lymphedema, not elsewhere classified: Secondary | ICD-10-CM | POA: Diagnosis not present

## 2018-10-01 DIAGNOSIS — I890 Lymphedema, not elsewhere classified: Secondary | ICD-10-CM | POA: Diagnosis not present

## 2018-10-02 DIAGNOSIS — I890 Lymphedema, not elsewhere classified: Secondary | ICD-10-CM | POA: Diagnosis not present

## 2018-10-03 DIAGNOSIS — I890 Lymphedema, not elsewhere classified: Secondary | ICD-10-CM | POA: Diagnosis not present

## 2018-10-05 DIAGNOSIS — I890 Lymphedema, not elsewhere classified: Secondary | ICD-10-CM | POA: Diagnosis not present

## 2018-10-06 ENCOUNTER — Other Ambulatory Visit: Payer: Self-pay | Admitting: Internal Medicine

## 2018-10-06 DIAGNOSIS — I890 Lymphedema, not elsewhere classified: Secondary | ICD-10-CM | POA: Diagnosis not present

## 2018-10-07 DIAGNOSIS — I890 Lymphedema, not elsewhere classified: Secondary | ICD-10-CM | POA: Diagnosis not present

## 2018-10-15 DIAGNOSIS — I890 Lymphedema, not elsewhere classified: Secondary | ICD-10-CM | POA: Diagnosis not present

## 2018-10-16 ENCOUNTER — Ambulatory Visit (INDEPENDENT_AMBULATORY_CARE_PROVIDER_SITE_OTHER): Payer: Medicare HMO | Admitting: General Practice

## 2018-10-16 DIAGNOSIS — I890 Lymphedema, not elsewhere classified: Secondary | ICD-10-CM | POA: Diagnosis not present

## 2018-10-16 DIAGNOSIS — I482 Chronic atrial fibrillation, unspecified: Secondary | ICD-10-CM | POA: Diagnosis not present

## 2018-10-16 DIAGNOSIS — Z7901 Long term (current) use of anticoagulants: Secondary | ICD-10-CM | POA: Diagnosis not present

## 2018-10-16 LAB — POCT INR: INR: 2.3 (ref 2.0–3.0)

## 2018-10-16 NOTE — Patient Instructions (Addendum)
Pre visit review using our clinic review tool, if applicable. No additional management support is needed unless otherwise documented below in the visit note.  Continue to take 1 tablet daily except take 1/2 tablet on Tuesdays and Saturdays.  Re-check in 3 to 4 weeks.

## 2018-10-17 DIAGNOSIS — I890 Lymphedema, not elsewhere classified: Secondary | ICD-10-CM | POA: Diagnosis not present

## 2018-10-18 DIAGNOSIS — I890 Lymphedema, not elsewhere classified: Secondary | ICD-10-CM | POA: Diagnosis not present

## 2018-10-19 DIAGNOSIS — I890 Lymphedema, not elsewhere classified: Secondary | ICD-10-CM | POA: Diagnosis not present

## 2018-10-20 DIAGNOSIS — I890 Lymphedema, not elsewhere classified: Secondary | ICD-10-CM | POA: Diagnosis not present

## 2018-10-21 DIAGNOSIS — I890 Lymphedema, not elsewhere classified: Secondary | ICD-10-CM | POA: Diagnosis not present

## 2018-10-22 DIAGNOSIS — I890 Lymphedema, not elsewhere classified: Secondary | ICD-10-CM | POA: Diagnosis not present

## 2018-10-23 DIAGNOSIS — I890 Lymphedema, not elsewhere classified: Secondary | ICD-10-CM | POA: Diagnosis not present

## 2018-10-24 DIAGNOSIS — I890 Lymphedema, not elsewhere classified: Secondary | ICD-10-CM | POA: Diagnosis not present

## 2018-11-01 DIAGNOSIS — I890 Lymphedema, not elsewhere classified: Secondary | ICD-10-CM | POA: Diagnosis not present

## 2018-11-02 DIAGNOSIS — I890 Lymphedema, not elsewhere classified: Secondary | ICD-10-CM | POA: Diagnosis not present

## 2018-11-03 DIAGNOSIS — I890 Lymphedema, not elsewhere classified: Secondary | ICD-10-CM | POA: Diagnosis not present

## 2018-11-04 DIAGNOSIS — I890 Lymphedema, not elsewhere classified: Secondary | ICD-10-CM | POA: Diagnosis not present

## 2018-11-05 ENCOUNTER — Encounter: Payer: Self-pay | Admitting: Internal Medicine

## 2018-11-05 ENCOUNTER — Ambulatory Visit (INDEPENDENT_AMBULATORY_CARE_PROVIDER_SITE_OTHER): Payer: Medicare HMO | Admitting: Internal Medicine

## 2018-11-05 VITALS — BP 138/78 | HR 89 | Temp 98.3°F | Ht 61.0 in | Wt 284.0 lb

## 2018-11-05 DIAGNOSIS — I10 Essential (primary) hypertension: Secondary | ICD-10-CM

## 2018-11-05 DIAGNOSIS — M5432 Sciatica, left side: Secondary | ICD-10-CM | POA: Diagnosis not present

## 2018-11-05 DIAGNOSIS — N184 Chronic kidney disease, stage 4 (severe): Secondary | ICD-10-CM

## 2018-11-05 DIAGNOSIS — I890 Lymphedema, not elsewhere classified: Secondary | ICD-10-CM

## 2018-11-05 DIAGNOSIS — Z6841 Body Mass Index (BMI) 40.0 and over, adult: Secondary | ICD-10-CM | POA: Diagnosis not present

## 2018-11-05 MED ORDER — WARFARIN SODIUM 5 MG PO TABS: 5.0000 mg | ORAL_TABLET | Freq: Every day | ORAL | 3 refills | Status: DC

## 2018-11-05 MED ORDER — OMEPRAZOLE 40 MG PO CPDR: 40.0000 mg | DELAYED_RELEASE_CAPSULE | Freq: Every day | ORAL | 3 refills | Status: DC

## 2018-11-05 MED ORDER — CARVEDILOL 6.25 MG PO TABS
6.2500 mg | ORAL_TABLET | Freq: Two times a day (BID) | ORAL | 3 refills | Status: DC
Start: 2018-11-05 — End: 2019-07-16

## 2018-11-05 MED ORDER — IRBESARTAN-HYDROCHLOROTHIAZIDE 150-12.5 MG PO TABS: 1.0000 | ORAL_TABLET | Freq: Every day | ORAL | 3 refills | Status: DC

## 2018-11-05 MED ORDER — PANCRELIPASE (LIP-PROT-AMYL) 12000-38000 UNITS PO CPEP: 12000.0000 [IU] | ORAL_CAPSULE | Freq: Every day | ORAL | 3 refills | Status: DC | PRN

## 2018-11-05 MED ORDER — FEBUXOSTAT 40 MG PO TABS: 40.0000 mg | ORAL_TABLET | Freq: Every day | ORAL | 3 refills | Status: DC

## 2018-11-05 MED ORDER — TRAMADOL HCL 50 MG PO TABS: 50.0000 mg | ORAL_TABLET | Freq: Two times a day (BID) | ORAL | 3 refills | Status: DC | PRN

## 2018-11-05 MED ORDER — LOVASTATIN 20 MG PO TABS: 20.0000 mg | ORAL_TABLET | Freq: Every day | ORAL | 3 refills | Status: DC

## 2018-11-05 MED ORDER — POLYETHYLENE GLYCOL 3350 17 GM/SCOOP PO POWD: ORAL | 3 refills | Status: DC

## 2018-11-05 NOTE — Assessment & Plan Note (Signed)
Better now 

## 2018-11-05 NOTE — Assessment & Plan Note (Addendum)
Legs - Nonpitting edema 3+ B H/o reaction to massage and elevation. She will think about a possibility of the device use

## 2018-11-05 NOTE — Assessment & Plan Note (Signed)
Lasix, Avalide, Coreg

## 2018-11-05 NOTE — Progress Notes (Signed)
Subjective:  Patient ID: Katelyn Jackson, female    DOB: 1946-08-28  Age: 72 y.o. MRN: [id number]  CC: No chief complaint on file.   HPI Katelyn Jackson presents for OA, knee pain, edema, breast cancer, GERD f/u  Outpatient Medications Prior to Visit  Medication Sig Dispense Refill  . carvedilol (COREG) 6.25 MG tablet Take 1 tablet (6.25 mg total) by mouth 2 (two) times daily with a meal. 60 tablet 5  . Cholecalciferol (VITAMIN D3) 2000 units capsule Take 1 capsule (2,000 Units total) by mouth daily. 100 capsule 3  . exemestane (AROMASIN) 25 MG tablet Take 1 tablet (25 mg total) by mouth daily after breakfast. 90 tablet 3  . febuxostat (ULORIC) 40 MG tablet Take 1 tablet (40 mg total) by mouth daily. 30 tablet 5  . furosemide (LASIX) 40 MG tablet Take 1 tablet (40 mg total) by mouth daily as needed. 30 tablet 2  . furosemide (LASIX) 40 MG tablet TAKE 1 TABLET BY MOUTH EVERY DAY AS NEEDED 90 tablet 3  . irbesartan-hydrochlorothiazide (AVALIDE) 150-12.5 MG tablet Take 1 tablet by mouth daily. 30 tablet 5  . levothyroxine (SYNTHROID, LEVOTHROID) 50 MCG tablet Take 1 tablet (50 mcg total) by mouth daily before breakfast. 90 tablet 3  . lidocaine (LIDODERM) 5 % Place 1-2 patches onto the skin daily. Remove & Discard patch within 12 hours or as directed by MD 60 patch 5  . lipase/protease/amylase (CREON) 12000 units CPEP capsule Take 1 capsule (12,000 Units total) by mouth daily as needed (stomach problems). 30 capsule 1  . lovastatin (MEVACOR) 20 MG tablet Take 1 tablet (20 mg total) by mouth daily. 30 tablet 5  . methocarbamol (ROBAXIN) 500 MG tablet Take 1 tablet (500 mg total) by mouth 4 (four) times daily. 120 tablet 5  . omeprazole (PRILOSEC) 40 MG capsule TAKE ONE CAPSULE BY MOUTH DAILY AS NEEDED FOR ACID REFLUX 30 capsule 11  . polyethylene glycol powder (GLYCOLAX/MIRALAX) powder mix 1 capful (17 grams) IN 8 ounces OF liquid EVERY DAY 1581 g 3  . potassium chloride (KLOR-CON) 8 MEQ  tablet Take 1 tablet (8 mEq total) by mouth daily. 30 tablet 5  . VOLTAREN 1 % GEL apply 4 grams 4 TIMES DAILY AS NEEDED FOR 30 DAYS 100 g 5  . warfarin (COUMADIN) 5 MG tablet Take 1 tablet (5 mg total) by mouth daily. None on Wed. 30 tablet 5   No facility-administered medications prior to visit.     ROS: Review of Systems  Constitutional: Positive for unexpected weight change. Negative for activity change, appetite change, chills and fatigue.  HENT: Negative for congestion, mouth sores and sinus pressure.   Eyes: Negative for visual disturbance.  Respiratory: Negative for cough and chest tightness.   Cardiovascular: Positive for leg swelling.  Gastrointestinal: Negative for abdominal pain and nausea.  Genitourinary: Negative for difficulty urinating, frequency and vaginal pain.  Musculoskeletal: Positive for arthralgias and back pain. Negative for gait problem.  Skin: Negative for pallor and rash.  Neurological: Negative for dizziness, tremors, weakness, numbness and headaches.  Psychiatric/Behavioral: Negative for confusion and sleep disturbance.    Objective:  BP 138/78 (BP Location: Right Arm, Patient Position: Sitting, Cuff Size: Large)   Pulse 89   Temp 98.3 F (36.8 C) (Oral)   Ht 5\' 1"  (1.549 m)   Wt 284 lb (128.8 kg)   LMP  (LMP Unknown)   SpO2 93%   BMI 53.66 kg/m   BP Readings from Last 3 Encounters:  11/05/18 138/78  08/09/18 (!) 145/88  08/06/18 130/88    Wt Readings from Last 3 Encounters:  11/05/18 284 lb (128.8 kg)  08/09/18 272 lb 12.8 oz (123.7 kg)  08/06/18 274 lb (124.3 kg)    Physical Exam Constitutional:      General: She is not in acute distress.    Appearance: She is well-developed.  HENT:     Head: Normocephalic.     Right Ear: External ear normal.     Left Ear: External ear normal.     Nose: Nose normal.  Eyes:     General:        Right eye: No discharge.        Left eye: No discharge.     Conjunctiva/sclera: Conjunctivae normal.       Pupils: Pupils are equal, round, and reactive to light.  Neck:     Musculoskeletal: Normal range of motion and neck supple. Muscular tenderness present.     Thyroid: No thyromegaly.     Vascular: No JVD.     Trachea: No tracheal deviation.  Cardiovascular:     Rate and Rhythm: Normal rate and regular rhythm.     Heart sounds: Normal heart sounds.  Pulmonary:     Effort: No respiratory distress.     Breath sounds: No stridor. No wheezing.  Abdominal:     General: Bowel sounds are normal. There is no distension.     Palpations: Abdomen is soft. There is no mass.     Tenderness: There is no abdominal tenderness. There is no guarding or rebound.  Musculoskeletal:        General: No tenderness.     Right lower leg: Edema present.     Left lower leg: Edema present.  Lymphadenopathy:     Cervical: No cervical adenopathy.  Skin:    Findings: No erythema or rash.  Neurological:     Mental Status: She is oriented to person, place, and time.     Cranial Nerves: No cranial nerve deficit.     Motor: No abnormal muscle tone.     Coordination: Coordination normal.     Deep Tendon Reflexes: Reflexes normal.  Psychiatric:        Behavior: Behavior normal.        Thought Content: Thought content normal.        Judgment: Judgment normal.   obese Walking w/cane - slow B knees w/OA Nonpitting edema 3+ B  Lab Results  Component Value Date   WBC 5.8 08/09/2018   HGB 10.3 (L) 08/09/2018   HCT 32.5 (L) 08/09/2018   PLT 217 08/09/2018   GLUCOSE 95 08/09/2018   ALT 13 08/09/2018   AST 16 08/09/2018   NA 135 08/09/2018   K 3.7 08/09/2018   CL 97 (L) 08/09/2018   CREATININE 2.34 (H) 08/09/2018   BUN 66 (H) 08/09/2018   CO2 27 08/09/2018   TSH 6.26 (H) 01/05/2018   INR 2.3 10/16/2018   HGBA1C 5.5 01/05/2018    No results found.  Assessment & Plan:   There are no diagnoses linked to this encounter.   No orders of the defined types were placed in this  encounter.    Follow-up: No follow-ups on file.  Walker Kehr, MD

## 2018-11-05 NOTE — Assessment & Plan Note (Signed)
Avalide, Lasix

## 2018-11-06 DIAGNOSIS — I890 Lymphedema, not elsewhere classified: Secondary | ICD-10-CM | POA: Diagnosis not present

## 2018-11-07 DIAGNOSIS — I890 Lymphedema, not elsewhere classified: Secondary | ICD-10-CM | POA: Diagnosis not present

## 2018-11-08 DIAGNOSIS — I890 Lymphedema, not elsewhere classified: Secondary | ICD-10-CM | POA: Diagnosis not present

## 2018-11-09 DIAGNOSIS — I890 Lymphedema, not elsewhere classified: Secondary | ICD-10-CM | POA: Diagnosis not present

## 2018-11-10 DIAGNOSIS — I890 Lymphedema, not elsewhere classified: Secondary | ICD-10-CM | POA: Diagnosis not present

## 2018-11-11 DIAGNOSIS — I890 Lymphedema, not elsewhere classified: Secondary | ICD-10-CM | POA: Diagnosis not present

## 2018-11-12 DIAGNOSIS — I890 Lymphedema, not elsewhere classified: Secondary | ICD-10-CM | POA: Diagnosis not present

## 2018-11-13 ENCOUNTER — Telehealth: Payer: Self-pay | Admitting: Internal Medicine

## 2018-11-13 ENCOUNTER — Ambulatory Visit (INDEPENDENT_AMBULATORY_CARE_PROVIDER_SITE_OTHER): Payer: Medicare HMO | Admitting: General Practice

## 2018-11-13 DIAGNOSIS — I482 Chronic atrial fibrillation, unspecified: Secondary | ICD-10-CM

## 2018-11-13 DIAGNOSIS — Z7901 Long term (current) use of anticoagulants: Secondary | ICD-10-CM | POA: Diagnosis not present

## 2018-11-13 DIAGNOSIS — I890 Lymphedema, not elsewhere classified: Secondary | ICD-10-CM | POA: Diagnosis not present

## 2018-11-13 LAB — POCT INR: INR: 3.1 — AB (ref 2.0–3.0)

## 2018-11-13 NOTE — Telephone Encounter (Signed)
On 12/30 appt it was discussed about a leg message device, she would like to try this, please advise

## 2018-11-13 NOTE — Patient Instructions (Addendum)
Pre visit review using our clinic review tool, if applicable. No additional management support is needed unless otherwise documented below in the visit note.  Skip coumadin today and then continue to take 1 tablet daily except take 1/2 tablet on Tuesdays and Saturdays.  Re-check in 3 to 4 weeks

## 2018-11-13 NOTE — Telephone Encounter (Signed)
Please advise 

## 2018-11-14 DIAGNOSIS — I890 Lymphedema, not elsewhere classified: Secondary | ICD-10-CM | POA: Diagnosis not present

## 2018-11-15 DIAGNOSIS — I890 Lymphedema, not elsewhere classified: Secondary | ICD-10-CM | POA: Diagnosis not present

## 2018-11-16 NOTE — Addendum Note (Signed)
Addended by: Cassandria Anger on: 11/16/2018 10:48 AM   Modules accepted: Orders

## 2018-11-16 NOTE — Telephone Encounter (Signed)
i'll ref to vasc surgery Thx

## 2018-11-17 DIAGNOSIS — I890 Lymphedema, not elsewhere classified: Secondary | ICD-10-CM | POA: Diagnosis not present

## 2018-11-18 DIAGNOSIS — I890 Lymphedema, not elsewhere classified: Secondary | ICD-10-CM | POA: Diagnosis not present

## 2018-11-19 DIAGNOSIS — I890 Lymphedema, not elsewhere classified: Secondary | ICD-10-CM | POA: Diagnosis not present

## 2018-11-20 DIAGNOSIS — M170 Bilateral primary osteoarthritis of knee: Secondary | ICD-10-CM | POA: Diagnosis not present

## 2018-11-20 DIAGNOSIS — I890 Lymphedema, not elsewhere classified: Secondary | ICD-10-CM | POA: Diagnosis not present

## 2018-11-21 DIAGNOSIS — I890 Lymphedema, not elsewhere classified: Secondary | ICD-10-CM | POA: Diagnosis not present

## 2018-11-22 DIAGNOSIS — I890 Lymphedema, not elsewhere classified: Secondary | ICD-10-CM | POA: Diagnosis not present

## 2018-11-24 DIAGNOSIS — I890 Lymphedema, not elsewhere classified: Secondary | ICD-10-CM | POA: Diagnosis not present

## 2018-11-25 DIAGNOSIS — I890 Lymphedema, not elsewhere classified: Secondary | ICD-10-CM | POA: Diagnosis not present

## 2018-11-26 ENCOUNTER — Telehealth: Payer: Self-pay | Admitting: Internal Medicine

## 2018-11-26 NOTE — Telephone Encounter (Signed)
Rec'd from [facility] forwarded 3 pages to Dr. Cassandria Anger

## 2018-12-04 DIAGNOSIS — I890 Lymphedema, not elsewhere classified: Secondary | ICD-10-CM | POA: Diagnosis not present

## 2018-12-05 DIAGNOSIS — I890 Lymphedema, not elsewhere classified: Secondary | ICD-10-CM | POA: Diagnosis not present

## 2018-12-06 DIAGNOSIS — I890 Lymphedema, not elsewhere classified: Secondary | ICD-10-CM | POA: Diagnosis not present

## 2018-12-07 DIAGNOSIS — I890 Lymphedema, not elsewhere classified: Secondary | ICD-10-CM | POA: Diagnosis not present

## 2018-12-08 DIAGNOSIS — I890 Lymphedema, not elsewhere classified: Secondary | ICD-10-CM | POA: Diagnosis not present

## 2018-12-09 DIAGNOSIS — I890 Lymphedema, not elsewhere classified: Secondary | ICD-10-CM | POA: Diagnosis not present

## 2018-12-10 DIAGNOSIS — I890 Lymphedema, not elsewhere classified: Secondary | ICD-10-CM | POA: Diagnosis not present

## 2018-12-11 ENCOUNTER — Ambulatory Visit (INDEPENDENT_AMBULATORY_CARE_PROVIDER_SITE_OTHER): Payer: Medicare HMO | Admitting: General Practice

## 2018-12-11 DIAGNOSIS — I890 Lymphedema, not elsewhere classified: Secondary | ICD-10-CM | POA: Diagnosis not present

## 2018-12-11 DIAGNOSIS — Z7901 Long term (current) use of anticoagulants: Secondary | ICD-10-CM

## 2018-12-11 DIAGNOSIS — I482 Chronic atrial fibrillation, unspecified: Secondary | ICD-10-CM

## 2018-12-11 LAB — POCT INR: INR: 2.9 (ref 2.0–3.0)

## 2018-12-11 NOTE — Patient Instructions (Addendum)
Pre visit review using our clinic review tool, if applicable. No additional management support is needed unless otherwise documented below in the visit note.  Continue to take 1 tablet daily except take 1/2 tablet on Tuesdays and Saturdays.  Re-check in 3 to 4 weeks.

## 2018-12-12 DIAGNOSIS — I890 Lymphedema, not elsewhere classified: Secondary | ICD-10-CM | POA: Diagnosis not present

## 2018-12-14 DIAGNOSIS — I890 Lymphedema, not elsewhere classified: Secondary | ICD-10-CM | POA: Diagnosis not present

## 2018-12-15 DIAGNOSIS — I890 Lymphedema, not elsewhere classified: Secondary | ICD-10-CM | POA: Diagnosis not present

## 2018-12-16 DIAGNOSIS — I890 Lymphedema, not elsewhere classified: Secondary | ICD-10-CM | POA: Diagnosis not present

## 2018-12-17 DIAGNOSIS — I890 Lymphedema, not elsewhere classified: Secondary | ICD-10-CM | POA: Diagnosis not present

## 2018-12-18 DIAGNOSIS — I890 Lymphedema, not elsewhere classified: Secondary | ICD-10-CM | POA: Diagnosis not present

## 2018-12-19 DIAGNOSIS — I890 Lymphedema, not elsewhere classified: Secondary | ICD-10-CM | POA: Diagnosis not present

## 2018-12-20 DIAGNOSIS — I890 Lymphedema, not elsewhere classified: Secondary | ICD-10-CM | POA: Diagnosis not present

## 2018-12-20 DIAGNOSIS — R5383 Other fatigue: Secondary | ICD-10-CM | POA: Diagnosis not present

## 2018-12-20 DIAGNOSIS — M170 Bilateral primary osteoarthritis of knee: Secondary | ICD-10-CM | POA: Diagnosis not present

## 2018-12-20 DIAGNOSIS — M810 Age-related osteoporosis without current pathological fracture: Secondary | ICD-10-CM | POA: Diagnosis not present

## 2018-12-21 DIAGNOSIS — I890 Lymphedema, not elsewhere classified: Secondary | ICD-10-CM | POA: Diagnosis not present

## 2018-12-22 DIAGNOSIS — I890 Lymphedema, not elsewhere classified: Secondary | ICD-10-CM | POA: Diagnosis not present

## 2018-12-23 DIAGNOSIS — I890 Lymphedema, not elsewhere classified: Secondary | ICD-10-CM | POA: Diagnosis not present

## 2018-12-24 DIAGNOSIS — I890 Lymphedema, not elsewhere classified: Secondary | ICD-10-CM | POA: Diagnosis not present

## 2018-12-25 DIAGNOSIS — I890 Lymphedema, not elsewhere classified: Secondary | ICD-10-CM | POA: Diagnosis not present

## 2018-12-26 DIAGNOSIS — I890 Lymphedema, not elsewhere classified: Secondary | ICD-10-CM | POA: Diagnosis not present

## 2018-12-28 DIAGNOSIS — I890 Lymphedema, not elsewhere classified: Secondary | ICD-10-CM | POA: Diagnosis not present

## 2018-12-29 DIAGNOSIS — I890 Lymphedema, not elsewhere classified: Secondary | ICD-10-CM | POA: Diagnosis not present

## 2018-12-30 DIAGNOSIS — I890 Lymphedema, not elsewhere classified: Secondary | ICD-10-CM | POA: Diagnosis not present

## 2019-01-01 ENCOUNTER — Telehealth: Payer: Self-pay | Admitting: *Deleted

## 2019-01-01 DIAGNOSIS — I890 Lymphedema, not elsewhere classified: Secondary | ICD-10-CM | POA: Diagnosis not present

## 2019-01-01 NOTE — Telephone Encounter (Signed)
Called to inquire about patient needing an interpreter for her upcoming AWV. Son states that patient will not, either he or his wife will accompany patient.

## 2019-01-02 DIAGNOSIS — I890 Lymphedema, not elsewhere classified: Secondary | ICD-10-CM | POA: Diagnosis not present

## 2019-01-03 DIAGNOSIS — I890 Lymphedema, not elsewhere classified: Secondary | ICD-10-CM | POA: Diagnosis not present

## 2019-01-04 DIAGNOSIS — I890 Lymphedema, not elsewhere classified: Secondary | ICD-10-CM | POA: Diagnosis not present

## 2019-01-05 DIAGNOSIS — I890 Lymphedema, not elsewhere classified: Secondary | ICD-10-CM | POA: Diagnosis not present

## 2019-01-06 DIAGNOSIS — I890 Lymphedema, not elsewhere classified: Secondary | ICD-10-CM | POA: Diagnosis not present

## 2019-01-07 DIAGNOSIS — I890 Lymphedema, not elsewhere classified: Secondary | ICD-10-CM | POA: Diagnosis not present

## 2019-01-07 NOTE — Progress Notes (Addendum)
Subjective:   Katelyn Jackson is a 73 y.o. female who presents for an Initial Medicare Annual Wellness Visit.  Review of Systems    No ROS.  Medicare Wellness Visit. Additional risk factors are reflected in the social history.  Cardiac Risk Factors include: advanced age (>43men, >53 women);dyslipidemia;hypertension;obesity (BMI >30kg/m2) Sleep patterns: feels rested on waking, gets up 1 times nightly to void and sleeps 7-9 hours nightly.    Home Safety/Smoke Alarms: Feels safe in home. Smoke alarms in place.  Living environment; residence and Firearm Safety: 2-story house, equipment: Hydrologist, Type: Tub Animal nutritionist. Lives with son, no needs for DME, good support system Seat Belt Safety/Bike Helmet: Wears seat belt.     Objective:    Today's Vitals   01/08/19 0951 01/08/19 1128  BP: 126/72   Pulse: 71   Resp: 17   SpO2: 98%   Weight: 290 lb (131.5 kg)   Height: 5\' 1"  (1.549 m)   PainSc:  2    Body mass index is 54.8 kg/m.  Advanced Directives 01/08/2019 11/09/2017 11/08/2017 09/05/2017 07/13/2017 06/19/2017 06/18/2017  Does Patient Have a Medical Advance Directive? No No No No No No No  Would patient like information on creating a medical advance directive? No - Patient declined - No - Patient declined - No - Patient declined Yes (Inpatient - patient defers creating a medical advance directive at this time) -    Current Medications (verified) Outpatient Encounter Medications as of 01/08/2019  Medication Sig  . Calcium-Magnesium-Vitamin D (CALCIUM 500 PO) Take 1 tablet by mouth daily.  . carvedilol (COREG) 6.25 MG tablet Take 1 tablet (6.25 mg total) by mouth 2 (two) times daily with a meal.  . Cholecalciferol (VITAMIN D3) 2000 units capsule Take 1 capsule (2,000 Units total) by mouth daily.  . Cyanocobalamin (VITAMIN B-12 PO) Take 1 tablet by mouth daily.  [name] exemestane (AROMASIN) 25 MG tablet Take 1 tablet (25 mg total) by mouth daily after breakfast.  .  febuxostat (ULORIC) 40 MG tablet Take 1 tablet (40 mg total) by mouth daily.  . ferrous sulfate 325 (65 FE) MG tablet Take 325 mg by mouth daily with breakfast.  . furosemide (LASIX) 40 MG tablet TAKE 1 TABLET BY MOUTH EVERY DAY AS NEEDED  . irbesartan-hydrochlorothiazide (AVALIDE) 150-12.5 MG tablet Take 1 tablet by mouth daily.  [name] levothyroxine (SYNTHROID, LEVOTHROID) 50 MCG tablet Take 1 tablet (50 mcg total) by mouth daily before breakfast.  . lidocaine (LIDODERM) 5 % Place 1-2 patches onto the skin daily. Remove & Discard patch within 12 hours or as directed by MD  . lipase/protease/amylase (CREON) 12000 units CPEP capsule Take 1 capsule (12,000 Units total) by mouth daily as needed (stomach problems).  . lovastatin (MEVACOR) 20 MG tablet Take 1 tablet (20 mg total) by mouth daily.  . methocarbamol (ROBAXIN) 500 MG tablet Take 1 tablet (500 mg total) by mouth 4 (four) times daily.  [name] omeprazole (PRILOSEC) 40 MG capsule Take 1 capsule (40 mg total) by mouth daily.  . polyethylene glycol powder (GLYCOLAX/MIRALAX) powder mix 1 capful (17 grams) IN 8 ounces OF liquid EVERY DAY  . potassium chloride (KLOR-CON) 8 MEQ tablet Take 1 tablet (8 mEq total) by mouth daily.  . traMADol (ULTRAM) 50 MG tablet Take 1 tablet (50 mg total) by mouth every 12 (twelve) hours as needed for severe pain.  [name] VOLTAREN 1 % GEL apply 4 grams 4 TIMES DAILY AS NEEDED FOR 30 DAYS  . warfarin (COUMADIN)  5 MG tablet Take 1 tablet (5 mg total) by mouth daily. None on Wed.   No facility-administered encounter medications on file as of 01/08/2019.     Allergies (verified) Chlorhexidine gluconate and Penicillins   History: Past Medical History:  Diagnosis Date  . Arthritis   . Cancer [facility])    breast cancer  . Chronic atrial fibrillation   . Chronic kidney disease    sees Dr Florene Glen  . Cramps, muscle, general   . Dyspnea   . Dyspnea on exertion   . Dysrhythmia   . GERD (gastroesophageal reflux disease)   . Headache    . Hyperlipidemia   . Hypertension   . Hypothyroidism   . Lymphedema   . Moderate to severe pulmonary hypertension ([place])   . Obesity   . Personal history of radiation therapy    Past Surgical History:  Procedure Laterality Date  . APPENDECTOMY    . BREAST BIOPSY Left 2018  . BREAST LUMPECTOMY Left 04/04/2017  . BREAST LUMPECTOMY WITH NEEDLE LOCALIZATION AND AXILLARY SENTINEL LYMPH NODE BX Left 04/04/2017   Procedure: BREAST LUMPECTOMY WITH NEEDLE LOCALIZATION x3 AND AXILLARY SENTINEL LYMPH NODE BX;  Surgeon: [name], MD;  Location: [facility];  Service: General;  Laterality: Left;  . CATARACT EXTRACTION W/ INTRAOCULAR LENS  IMPLANT, BILATERAL Bilateral 2018  . COLONOSCOPY    . EYE SURGERY    . GLAUCOMA SURGERY Bilateral 2018  . RE-EXCISION OF BREAST CANCER,SUPERIOR MARGINS Left 04/26/2017   Procedure: RE-EXCISION OF LEFT BREAST CANCER;  Surgeon: [name], MD;  Location: [facility] OR;  Service: General;  Laterality: Left;   Family History  Problem Relation Age of Onset  . CAD Mother 91  . Stroke Mother 13       hemorr CVA  . COPD Father 73  . Cancer Neg Hx    Social History   Socioeconomic History  . Marital status: Widowed    Spouse name: Not on file  . Number of children: 1  . Years of education: Not on file  . Highest education level: Not on file  Occupational History  . Not on file  Social Needs  . Financial resource strain: Not hard at all  . Food insecurity:    Worry: Never true    Inability: Never true  . Transportation needs:    Medical: No    Non-medical: No  Tobacco Use  . Smoking status: Never Smoker  . Smokeless tobacco: Never Used  Substance and Sexual Activity  . Alcohol use: No    Alcohol/week: 0.0 standard drinks  . Drug use: No  . Sexual activity: Not on file  Lifestyle  . Physical activity:    Days per week: 3 days    Minutes per session: 30 min  . Stress: Not at all  Relationships  . Social connections:    Talks on phone: More than three  times a week    Gets together: More than three times a week    Attends religious service: Not on file    Active member of club or organization: No    Attends meetings of clubs or organizations: Never    Relationship status: Widowed  Other Topics Concern  . Not on file  Social History Narrative  . Not on file    Tobacco Counseling Counseling given: Not Answered  Activities of Daily Living In your present state of health, do you have any difficulty performing the following activities: 01/08/2019  Hearing? N  Vision? N  Difficulty concentrating or making decisions? N  Walking or climbing stairs? Y  Dressing or bathing? N  Doing errands, shopping? Y  Preparing Food and eating ? Y  Using the Toilet? N  In the past six months, have you accidently leaked urine? N  Do you have problems with loss of bowel control? N  Managing your Medications? N  Managing your Finances? N  Housekeeping or managing your Housekeeping? Y  Some recent data might be hidden     Immunizations and Health Maintenance Immunization History  Administered Date(s) Administered  . Influenza, High Dose Seasonal PF 07/11/2017, 08/06/2018  . Pneumococcal Conjugate-13 04/23/2018   Health Maintenance Due  Topic Date Due  . Hepatitis C Screening  [birth year]  . TETANUS/TDAP  12/05/1964  . COLONOSCOPY  12/06/1995    Patient Care Team: Cassandria Anger, MD as PCP - General (Internal Medicine) [name], MD as Consulting Physician (General Surgery) Truitt Merle, MD as Consulting Physician (Hematology) Kyung Rudd, MD as Consulting Physician (Radiation Oncology) Estanislado Emms, MD as Consulting Physician (Nephrology) Lorretta Harp, MD as Consulting Physician (Cardiology) Verner Chol, MD as Consulting Physician (Sports Medicine) Delice Bison, Charlestine Massed, NP as Nurse Practitioner (Hematology and Oncology)  Indicate any recent Medical Services you may have received from other than [facility] providers in  the past year (date may be approximate).     Assessment:   This is a routine wellness examination for Summerlyn. Physical assessment deferred to PCP.  Hearing/Vision screen Hearing Screening Comments: Able to hear conversational tones w/o difficulty. No issues reported.  Passed whisper test Vision Screening Comments: appointment yearly Dr. Quay Burow  Dietary issues and exercise activities discussed: Current Exercise Habits: Home exercise routine(chair exercise print-outs provided), Type of exercise: walking(Old PT workouts), Time (Minutes): 30, Frequency (Times/Week): 5, Weekly Exercise (Minutes/Week): 150, Intensity: Mild, Exercise limited by: orthopedic condition(s);Other - see comments(obesity)  Diet (meal preparation, eat out, water intake, caffeinated beverages, dairy products, fruits and vegetables): in general, a "healthy" diet  , well balanced.   Reviewed heart healthy diet. Encouraged patient to increase daily water and healthy fluid intake.  Discussed supplementing with Ensure, samples and coupons provided.  Goals    . Patient Stated     I want to stay as strong and as independent as possible.      Depression Screen PHQ 2/9 Scores 01/08/2019 09/05/2017 05/17/2017  PHQ - 2 Score 0 0 0    Fall Risk Fall Risk  01/08/2019 09/05/2017 05/17/2017  Falls in the past year? 0 No No  Risk for fall due to : Impaired balance/gait;Impaired mobility - -  Follow up Falls prevention discussed - -   Cognitive Function:       Ad8 score reviewed for issues:  Issues making decisions: no  Less interest in hobbies / activities: no  Repeats questions, stories (family complaining): no  Trouble using ordinary gadgets (microwave, computer, phone):no  Forgets the month or year: no  Mismanaging finances: no  Remembering appts: no  Daily problems with thinking and/or memory: no Ad8 score is= 0  Screening Tests Health Maintenance  Topic Date Due  . Hepatitis C Screening  January 05, 1946  .  TETANUS/TDAP  12/05/1964  . COLONOSCOPY  12/06/1995  . PNA vac Low Risk Adult (2 of 2 - PPSV23) 04/24/2019  . MAMMOGRAM  01/30/2020  . INFLUENZA VACCINE  Completed  . DEXA SCAN  Completed     Plan:     Reviewed health maintenance screenings with patient today and relevant  education, vaccines, and/or referrals were provided.   Continue doing brain stimulating activities (puzzles, reading, adult coloring books, staying active) to keep memory sharp.   Continue to eat heart healthy diet (full of fruits, vegetables, whole grains, lean protein, water--limit salt, fat, and sugar intake) and increase physical activity as tolerated.  I have personally reviewed and noted the following in the patient's chart:   . Medical and social history . Use of alcohol, tobacco or illicit drugs  . Current medications and supplements . Functional ability and status . Nutritional status . Physical activity . Advanced directives . List of other physicians . Vitals . Screenings to include cognitive, depression, and falls . Referrals and appointments  In addition, I have reviewed and discussed with patient certain preventive protocols, quality metrics, and best practice recommendations. A written personalized care plan for preventive services as well as general preventive health recommendations were provided to patient.     Michiel Cowboy, RN   01/08/2019   Medical screening examination/treatment/procedure(s) were performed by non-physician practitioner and as supervising physician I was immediately available for consultation/collaboration. I agree with above. Lew Dawes, MD

## 2019-01-08 ENCOUNTER — Telehealth: Payer: Self-pay | Admitting: *Deleted

## 2019-01-08 ENCOUNTER — Ambulatory Visit (INDEPENDENT_AMBULATORY_CARE_PROVIDER_SITE_OTHER): Payer: Medicare HMO | Admitting: *Deleted

## 2019-01-08 ENCOUNTER — Ambulatory Visit (INDEPENDENT_AMBULATORY_CARE_PROVIDER_SITE_OTHER): Payer: Medicare HMO | Admitting: General Practice

## 2019-01-08 VITALS — BP 126/72 | HR 71 | Resp 17 | Ht 61.0 in | Wt 290.0 lb

## 2019-01-08 DIAGNOSIS — Z0000 Encounter for general adult medical examination without abnormal findings: Secondary | ICD-10-CM

## 2019-01-08 DIAGNOSIS — I890 Lymphedema, not elsewhere classified: Secondary | ICD-10-CM | POA: Diagnosis not present

## 2019-01-08 DIAGNOSIS — Z7901 Long term (current) use of anticoagulants: Secondary | ICD-10-CM

## 2019-01-08 DIAGNOSIS — I482 Chronic atrial fibrillation, unspecified: Secondary | ICD-10-CM

## 2019-01-08 LAB — POCT INR: INR: 2.4 (ref 2.0–3.0)

## 2019-01-08 NOTE — Patient Instructions (Addendum)
Pre visit review using our clinic review tool, if applicable. No additional management support is needed unless otherwise documented below in the visit note.  Continue to take 1 tablet daily except take 1/2 tablet on Tuesdays and Saturdays.  Re-check in 3 to 6 weeks.

## 2019-01-08 NOTE — Telephone Encounter (Signed)
During AWV, patient requested a referral to  phlebologist  Dr. Renaldo Reel. Stating that the edema in her legs is severe and she needs a specialist.

## 2019-01-08 NOTE — Patient Instructions (Addendum)
Continue doing brain stimulating activities (puzzles, reading, adult coloring books, staying active) to keep memory sharp.   Continue to eat heart healthy diet (full of fruits, vegetables, whole grains, lean protein, water--limit salt, fat, and sugar intake) and increase physical activity as tolerated.   Katelyn Jackson , Thank you for taking time to come for your Medicare Wellness Visit. I appreciate your ongoing commitment to your health goals. Please review the following plan we discussed and let me know if I can assist you in the future.   These are the goals we discussed: Goals    . Patient Stated     I want to stay as strong and as independent as possible.       This is a list of the screening recommended for you and due dates:  Health Maintenance  Topic Date Due  .  Hepatitis C: One time screening is recommended by Center for Disease Control  (CDC) for  adults born from [birth year] through 1965.   10-02-1946  . Tetanus Vaccine  12/05/1964  . Colon Cancer Screening  12/06/1995  . Pneumonia vaccines (2 of 2 - PPSV23) 04/24/2019  . Mammogram  01/30/2020  . Flu Shot  Completed  . DEXA scan (bone density measurement)  Completed    Health Maintenance, Female Adopting a healthy lifestyle and getting preventive care can go a long way to promote health and wellness. Talk with your health care provider about what schedule of regular examinations is right for you. This is a good chance for you to check in with your provider about disease prevention and staying healthy. In between checkups, there are plenty of things you can do on your own. Experts have done a lot of research about which lifestyle changes and preventive measures are most likely to keep you healthy. Ask your health care provider for more information. Weight and diet Eat a healthy diet  Be sure to include plenty of vegetables, fruits, low-fat dairy products, and lean protein.  Do not eat a lot of foods high in solid fats, added  sugars, or salt.  Get regular exercise. This is one of the most important things you can do for your health. ? Most adults should exercise for at least 150 minutes each week. The exercise should increase your heart rate and make you sweat (moderate-intensity exercise). ? Most adults should also do strengthening exercises at least twice a week. This is in addition to the moderate-intensity exercise. Maintain a healthy weight  Body mass index (BMI) is a measurement that can be used to identify possible weight problems. It estimates body fat based on height and weight. Your health care provider can help determine your BMI and help you achieve or maintain a healthy weight.  For females 73 years of age and older: ? A BMI below 18.5 is considered underweight. ? A BMI of 18.5 to 24.9 is normal. ? A BMI of 25 to 29.9 is considered overweight. ? A BMI of 30 and above is considered obese. Watch levels of cholesterol and blood lipids  You should start having your blood tested for lipids and cholesterol at 73 years of age, then have this test every 5 years.  You may need to have your cholesterol levels checked more often if: ? Your lipid or cholesterol levels are high. ? You are older than 73 years of age. ? You are at high risk for heart disease. Cancer screening Lung Cancer  Lung cancer screening is recommended for adults 63-60 years old  who are at high risk for lung cancer because of a history of smoking.  A yearly low-dose CT scan of the lungs is recommended for people who: ? Currently smoke. ? Have quit within the past 15 years. ? Have at least a 30-pack-year history of smoking. A pack year is smoking an average of one pack of cigarettes a day for 1 year.  Yearly screening should continue until it has been 15 years since you quit.  Yearly screening should stop if you develop a health problem that would prevent you from having lung cancer treatment. Breast Cancer  Practice breast  self-awareness. This means understanding how your breasts normally appear and feel.  It also means doing regular breast self-exams. Let your health care provider know about any changes, no matter how small.  If you are in your 20s or 30s, you should have a clinical breast exam (CBE) by a health care provider every 1-3 years as part of a regular health exam.  If you are 38 or older, have a CBE every year. Also consider having a breast X-ray (mammogram) every year.  If you have a family history of breast cancer, talk to your health care provider about genetic screening.  If you are at high risk for breast cancer, talk to your health care provider about having an MRI and a mammogram every year.  Breast cancer gene (BRCA) assessment is recommended for women who have family members with BRCA-related cancers. BRCA-related cancers include: ? Breast. ? Ovarian. ? Tubal. ? Peritoneal cancers.  Results of the assessment will determine the need for genetic counseling and BRCA1 and BRCA2 testing. Cervical Cancer Your health care provider may recommend that you be screened regularly for cancer of the pelvic organs (ovaries, uterus, and vagina). This screening involves a pelvic examination, including checking for microscopic changes to the surface of your cervix (Pap test). You may be encouraged to have this screening done every 3 years, beginning at age 72.  For women ages 60-65, health care providers may recommend pelvic exams and Pap testing every 3 years, or they may recommend the Pap and pelvic exam, combined with testing for human papilloma virus (HPV), every 5 years. Some types of HPV increase your risk of cervical cancer. Testing for HPV may also be done on women of any age with unclear Pap test results.  Other health care providers may not recommend any screening for nonpregnant women who are considered low risk for pelvic cancer and who do not have symptoms. Ask your health care provider if a  screening pelvic exam is right for you.  If you have had past treatment for cervical cancer or a condition that could lead to cancer, you need Pap tests and screening for cancer for at least 20 years after your treatment. If Pap tests have been discontinued, your risk factors (such as having a new sexual partner) need to be reassessed to determine if screening should resume. Some women have medical problems that increase the chance of getting cervical cancer. In these cases, your health care provider may recommend more frequent screening and Pap tests. Colorectal Cancer  This type of cancer can be detected and often prevented.  Routine colorectal cancer screening usually begins at 73 years of age and continues through 73 years of age.  Your health care provider may recommend screening at an earlier age if you have risk factors for colon cancer.  Your health care provider may also recommend using home test kits to check for  hidden blood in the stool.  A small camera at the end of a tube can be used to examine your colon directly (sigmoidoscopy or colonoscopy). This is done to check for the earliest forms of colorectal cancer.  Routine screening usually begins at age 51.  Direct examination of the colon should be repeated every 5-10 years through 73 years of age. However, you may need to be screened more often if early forms of precancerous polyps or small growths are found. Skin Cancer  Check your skin from head to toe regularly.  Tell your health care provider about any new moles or changes in moles, especially if there is a change in a mole's shape or color.  Also tell your health care provider if you have a mole that is larger than the size of a pencil eraser.  Always use sunscreen. Apply sunscreen liberally and repeatedly throughout the day.  Protect yourself by wearing long sleeves, pants, a wide-brimmed hat, and sunglasses whenever you are outside. Heart disease, diabetes, and high  blood pressure  High blood pressure causes heart disease and increases the risk of stroke. High blood pressure is more likely to develop in: ? People who have blood pressure in the high end of the normal range (130-139/85-89 mm Hg). ? People who are overweight or obese. ? People who are African American.  If you are 35-37 years of age, have your blood pressure checked every 3-5 years. If you are 49 years of age or older, have your blood pressure checked every year. You should have your blood pressure measured twice-once when you are at a hospital or clinic, and once when you are not at a hospital or clinic. Record the average of the two measurements. To check your blood pressure when you are not at a hospital or clinic, you can use: ? An automated blood pressure machine at a pharmacy. ? A home blood pressure monitor.  If you are between 86 years and 19 years old, ask your health care provider if you should take aspirin to prevent strokes.  Have regular diabetes screenings. This involves taking a blood sample to check your fasting blood sugar level. ? If you are at a normal weight and have a low risk for diabetes, have this test once every three years after 73 years of age. ? If you are overweight and have a high risk for diabetes, consider being tested at a younger age or more often. Preventing infection Hepatitis B  If you have a higher risk for hepatitis B, you should be screened for this virus. You are considered at high risk for hepatitis B if: ? You were born in a country where hepatitis B is common. Ask your health care provider which countries are considered high risk. ? Your parents were born in a high-risk country, and you have not been immunized against hepatitis B (hepatitis B vaccine). ? You have HIV or AIDS. ? You use needles to inject street drugs. ? You live with someone who has hepatitis B. ? You have had sex with someone who has hepatitis B. ? You get hemodialysis  treatment. ? You take certain medicines for conditions, including cancer, organ transplantation, and autoimmune conditions. Hepatitis C  Blood testing is recommended for: ? Everyone born from [birth year] through 1965. ? Anyone with known risk factors for hepatitis C. Sexually transmitted infections (STIs)  You should be screened for sexually transmitted infections (STIs) including gonorrhea and chlamydia if: ? You are sexually active and are younger  than 73 years of age. ? You are older than 73 years of age and your health care provider tells you that you are at risk for this type of infection. ? Your sexual activity has changed since you were last screened and you are at an increased risk for chlamydia or gonorrhea. Ask your health care provider if you are at risk.  If you do not have HIV, but are at risk, it may be recommended that you take a prescription medicine daily to prevent HIV infection. This is called pre-exposure prophylaxis (PrEP). You are considered at risk if: ? You are sexually active and do not regularly use condoms or know the HIV status of your partner(s). ? You take drugs by injection. ? You are sexually active with a partner who has HIV. Talk with your health care provider about whether you are at high risk of being infected with HIV. If you choose to begin PrEP, you should first be tested for HIV. You should then be tested every 3 months for as long as you are taking PrEP. Pregnancy  If you are premenopausal and you may become pregnant, ask your health care provider about preconception counseling.  If you may become pregnant, take 400 to 800 micrograms (mcg) of folic acid every day.  If you want to prevent pregnancy, talk to your health care provider about birth control (contraception). Osteoporosis and menopause  Osteoporosis is a disease in which the bones lose minerals and strength with aging. This can result in serious bone fractures. Your risk for osteoporosis can be  identified using a bone density scan.  If you are 54 years of age or older, or if you are at risk for osteoporosis and fractures, ask your health care provider if you should be screened.  Ask your health care provider whether you should take a calcium or vitamin D supplement to lower your risk for osteoporosis.  Menopause may have certain physical symptoms and risks.  Hormone replacement therapy may reduce some of these symptoms and risks. Talk to your health care provider about whether hormone replacement therapy is right for you. Follow these instructions at home:  Schedule regular health, dental, and eye exams.  Stay current with your immunizations.  Do not use any tobacco products including cigarettes, chewing tobacco, or electronic cigarettes.  If you are pregnant, do not drink alcohol.  If you are breastfeeding, limit how much and how often you drink alcohol.  Limit alcohol intake to no more than 1 drink per day for nonpregnant women. One drink equals 12 ounces of beer, 5 ounces of Jameca Chumley, or 1 ounces of hard liquor.  Do not use street drugs.  Do not share needles.  Ask your health care provider for help if you need support or information about quitting drugs.  Tell your health care provider if you often feel depressed.  Tell your health care provider if you have ever been abused or do not feel safe at home. This information is not intended to replace advice given to you by your health care provider. Make sure you discuss any questions you have with your health care provider. Document Released: 05/09/2011 Document Revised: 03/31/2016 Document Reviewed: 07/28/2015 Elsevier Interactive Patient Education  2019 [employer].

## 2019-01-09 DIAGNOSIS — I890 Lymphedema, not elsewhere classified: Secondary | ICD-10-CM | POA: Diagnosis not present

## 2019-01-09 NOTE — Telephone Encounter (Signed)
I've referred her to a Vascular Clinic. Did she go? Thx

## 2019-01-10 DIAGNOSIS — I890 Lymphedema, not elsewhere classified: Secondary | ICD-10-CM | POA: Diagnosis not present

## 2019-01-10 NOTE — Telephone Encounter (Signed)
Called and spoke to patient's son to inform him that a referral has been sent to [facility] and they should reach out to patient to make an appointment soon. Son verbalized understanding and was appreciative for the call-back.

## 2019-01-12 DIAGNOSIS — I890 Lymphedema, not elsewhere classified: Secondary | ICD-10-CM | POA: Diagnosis not present

## 2019-01-13 DIAGNOSIS — I890 Lymphedema, not elsewhere classified: Secondary | ICD-10-CM | POA: Diagnosis not present

## 2019-01-14 DIAGNOSIS — I890 Lymphedema, not elsewhere classified: Secondary | ICD-10-CM | POA: Diagnosis not present

## 2019-01-15 DIAGNOSIS — M170 Bilateral primary osteoarthritis of knee: Secondary | ICD-10-CM | POA: Diagnosis not present

## 2019-01-15 DIAGNOSIS — I890 Lymphedema, not elsewhere classified: Secondary | ICD-10-CM | POA: Diagnosis not present

## 2019-01-16 ENCOUNTER — Telehealth: Payer: Self-pay | Admitting: *Deleted

## 2019-01-16 NOTE — Telephone Encounter (Signed)
error 

## 2019-01-16 NOTE — Telephone Encounter (Signed)
So we do not need to do anything else, correct? Thx

## 2019-01-17 DIAGNOSIS — I890 Lymphedema, not elsewhere classified: Secondary | ICD-10-CM | POA: Diagnosis not present

## 2019-01-17 NOTE — Telephone Encounter (Signed)
Got it. Thx 

## 2019-01-18 DIAGNOSIS — I890 Lymphedema, not elsewhere classified: Secondary | ICD-10-CM | POA: Diagnosis not present

## 2019-01-19 DIAGNOSIS — I890 Lymphedema, not elsewhere classified: Secondary | ICD-10-CM | POA: Diagnosis not present

## 2019-01-20 DIAGNOSIS — I890 Lymphedema, not elsewhere classified: Secondary | ICD-10-CM | POA: Diagnosis not present

## 2019-01-21 DIAGNOSIS — I890 Lymphedema, not elsewhere classified: Secondary | ICD-10-CM | POA: Diagnosis not present

## 2019-01-22 DIAGNOSIS — R600 Localized edema: Secondary | ICD-10-CM | POA: Diagnosis not present

## 2019-01-22 DIAGNOSIS — I890 Lymphedema, not elsewhere classified: Secondary | ICD-10-CM | POA: Diagnosis not present

## 2019-01-22 DIAGNOSIS — I129 Hypertensive chronic kidney disease with stage 1 through stage 4 chronic kidney disease, or unspecified chronic kidney disease: Secondary | ICD-10-CM | POA: Diagnosis not present

## 2019-01-22 DIAGNOSIS — M170 Bilateral primary osteoarthritis of knee: Secondary | ICD-10-CM | POA: Diagnosis not present

## 2019-01-22 DIAGNOSIS — D649 Anemia, unspecified: Secondary | ICD-10-CM | POA: Diagnosis not present

## 2019-01-22 DIAGNOSIS — N184 Chronic kidney disease, stage 4 (severe): Secondary | ICD-10-CM | POA: Diagnosis not present

## 2019-01-22 DIAGNOSIS — C50919 Malignant neoplasm of unspecified site of unspecified female breast: Secondary | ICD-10-CM | POA: Diagnosis not present

## 2019-01-22 DIAGNOSIS — Z6841 Body Mass Index (BMI) 40.0 and over, adult: Secondary | ICD-10-CM | POA: Diagnosis not present

## 2019-01-23 DIAGNOSIS — I890 Lymphedema, not elsewhere classified: Secondary | ICD-10-CM | POA: Diagnosis not present

## 2019-01-24 DIAGNOSIS — I890 Lymphedema, not elsewhere classified: Secondary | ICD-10-CM | POA: Diagnosis not present

## 2019-01-26 DIAGNOSIS — I890 Lymphedema, not elsewhere classified: Secondary | ICD-10-CM | POA: Diagnosis not present

## 2019-01-27 DIAGNOSIS — I890 Lymphedema, not elsewhere classified: Secondary | ICD-10-CM | POA: Diagnosis not present

## 2019-01-29 DIAGNOSIS — M170 Bilateral primary osteoarthritis of knee: Secondary | ICD-10-CM | POA: Diagnosis not present

## 2019-01-31 ENCOUNTER — Ambulatory Visit
Admission: RE | Admit: 2019-01-31 | Discharge: 2019-01-31 | Disposition: A | Discharge status: Home / Self Care | Payer: Medicare HMO | Source: Ambulatory Visit | Attending: Hematology | Admitting: Hematology

## 2019-01-31 ENCOUNTER — Other Ambulatory Visit: Payer: Self-pay

## 2019-01-31 DIAGNOSIS — Z170 Estrogen receptor positive status [ER+]: Principal | ICD-10-CM

## 2019-01-31 DIAGNOSIS — C50412 Malignant neoplasm of upper-outer quadrant of left female breast: Secondary | ICD-10-CM | POA: Diagnosis not present

## 2019-02-02 ENCOUNTER — Other Ambulatory Visit: Payer: Self-pay | Admitting: Internal Medicine

## 2019-02-07 ENCOUNTER — Other Ambulatory Visit: Payer: Medicare HMO

## 2019-02-07 ENCOUNTER — Ambulatory Visit: Payer: Medicare HMO | Admitting: Hematology

## 2019-02-14 ENCOUNTER — Telehealth: Payer: Self-pay | Admitting: Emergency Medicine

## 2019-02-14 NOTE — Telephone Encounter (Signed)
Spoke with pt's son, states pt would like to cancel both OV with Plot and Coumadin visit with Jenny Reichmann. Advised that Jenny Reichmann would come to car and check levels. States she would like to skip this time. Please advise if this is okay.

## 2019-02-19 ENCOUNTER — Ambulatory Visit: Payer: Medicare HMO | Admitting: Internal Medicine

## 2019-02-19 ENCOUNTER — Ambulatory Visit: Payer: Medicare HMO

## 2019-02-20 NOTE — Telephone Encounter (Signed)
Noted  

## 2019-03-18 DIAGNOSIS — R609 Edema, unspecified: Secondary | ICD-10-CM | POA: Diagnosis not present

## 2019-03-18 DIAGNOSIS — I890 Lymphedema, not elsewhere classified: Secondary | ICD-10-CM | POA: Diagnosis not present

## 2019-03-27 ENCOUNTER — Other Ambulatory Visit: Payer: Self-pay | Admitting: *Deleted

## 2019-03-27 MED ORDER — VOLTAREN 1 % TD GEL: TRANSDERMAL | 5 refills | Status: DC

## 2019-04-05 ENCOUNTER — Telehealth: Payer: Self-pay | Admitting: Hematology

## 2019-04-05 DIAGNOSIS — I890 Lymphedema, not elsewhere classified: Secondary | ICD-10-CM | POA: Diagnosis not present

## 2019-04-05 DIAGNOSIS — Z170 Estrogen receptor positive status [ER+]: Secondary | ICD-10-CM | POA: Diagnosis not present

## 2019-04-05 DIAGNOSIS — C50412 Malignant neoplasm of upper-outer quadrant of left female breast: Secondary | ICD-10-CM | POA: Diagnosis not present

## 2019-04-05 NOTE — Telephone Encounter (Signed)
Scheduled appt per 5/29 sch message.  A calendar will be mailed out.

## 2019-04-08 DIAGNOSIS — I890 Lymphedema, not elsewhere classified: Secondary | ICD-10-CM | POA: Diagnosis not present

## 2019-04-09 DIAGNOSIS — I890 Lymphedema, not elsewhere classified: Secondary | ICD-10-CM | POA: Diagnosis not present

## 2019-04-10 DIAGNOSIS — I890 Lymphedema, not elsewhere classified: Secondary | ICD-10-CM | POA: Diagnosis not present

## 2019-04-11 DIAGNOSIS — I890 Lymphedema, not elsewhere classified: Secondary | ICD-10-CM | POA: Diagnosis not present

## 2019-04-12 DIAGNOSIS — I890 Lymphedema, not elsewhere classified: Secondary | ICD-10-CM | POA: Diagnosis not present

## 2019-04-13 DIAGNOSIS — I890 Lymphedema, not elsewhere classified: Secondary | ICD-10-CM | POA: Diagnosis not present

## 2019-04-14 DIAGNOSIS — I890 Lymphedema, not elsewhere classified: Secondary | ICD-10-CM | POA: Diagnosis not present

## 2019-04-15 DIAGNOSIS — I890 Lymphedema, not elsewhere classified: Secondary | ICD-10-CM | POA: Diagnosis not present

## 2019-04-15 DIAGNOSIS — R609 Edema, unspecified: Secondary | ICD-10-CM | POA: Diagnosis not present

## 2019-04-16 DIAGNOSIS — I890 Lymphedema, not elsewhere classified: Secondary | ICD-10-CM | POA: Diagnosis not present

## 2019-04-17 DIAGNOSIS — I890 Lymphedema, not elsewhere classified: Secondary | ICD-10-CM | POA: Diagnosis not present

## 2019-04-18 DIAGNOSIS — I890 Lymphedema, not elsewhere classified: Secondary | ICD-10-CM | POA: Diagnosis not present

## 2019-04-19 DIAGNOSIS — I890 Lymphedema, not elsewhere classified: Secondary | ICD-10-CM | POA: Diagnosis not present

## 2019-04-20 DIAGNOSIS — I890 Lymphedema, not elsewhere classified: Secondary | ICD-10-CM | POA: Diagnosis not present

## 2019-04-21 DIAGNOSIS — I890 Lymphedema, not elsewhere classified: Secondary | ICD-10-CM | POA: Diagnosis not present

## 2019-05-06 ENCOUNTER — Telehealth: Payer: Self-pay | Admitting: Internal Medicine

## 2019-05-06 DIAGNOSIS — I890 Lymphedema, not elsewhere classified: Secondary | ICD-10-CM | POA: Diagnosis not present

## 2019-05-06 NOTE — Telephone Encounter (Signed)
Medication Refill - Medication: febuxostat (ULORIC) 40 MG tablet PA is needed for this medication / please advise   Has the patient contacted their pharmacy? Yes.   (Agent: If no, request that the patient contact the pharmacy for the refill.) (Agent: If yes, when and what did the pharmacy advise?)call pcp  Preferred Pharmacy (with phone number or street name): [facility], [place] - [address] [phone] (Phone) [phone] (Fax)      Agent: Please be advised that RX refills may take up to 3 business days. We ask that you follow-up with your pharmacy.

## 2019-05-08 DIAGNOSIS — I890 Lymphedema, not elsewhere classified: Secondary | ICD-10-CM | POA: Diagnosis not present

## 2019-05-08 NOTE — Telephone Encounter (Signed)
Key: [id number]  [employer]'s covered (formulary) drugs are allopurinol tablet, probenecid tablet and probenecid-colchicine tablet.

## 2019-05-09 DIAGNOSIS — I890 Lymphedema, not elsewhere classified: Secondary | ICD-10-CM | POA: Diagnosis not present

## 2019-05-09 DIAGNOSIS — Q820 Hereditary lymphedema: Secondary | ICD-10-CM | POA: Diagnosis not present

## 2019-05-10 DIAGNOSIS — I890 Lymphedema, not elsewhere classified: Secondary | ICD-10-CM | POA: Diagnosis not present

## 2019-05-11 DIAGNOSIS — I890 Lymphedema, not elsewhere classified: Secondary | ICD-10-CM | POA: Diagnosis not present

## 2019-05-12 DIAGNOSIS — I890 Lymphedema, not elsewhere classified: Secondary | ICD-10-CM | POA: Diagnosis not present

## 2019-05-13 DIAGNOSIS — I890 Lymphedema, not elsewhere classified: Secondary | ICD-10-CM | POA: Diagnosis not present

## 2019-05-14 DIAGNOSIS — I890 Lymphedema, not elsewhere classified: Secondary | ICD-10-CM | POA: Diagnosis not present

## 2019-05-15 DIAGNOSIS — I890 Lymphedema, not elsewhere classified: Secondary | ICD-10-CM | POA: Diagnosis not present

## 2019-05-15 NOTE — Telephone Encounter (Addendum)
Son calling back to ask about this PA for ULORIC 40 mg. Pt has been out of the medication for a week now. Please advise on which one dr will send in. Thanks  Please call son [name] at [phone]

## 2019-05-15 NOTE — Telephone Encounter (Signed)
PA came back denied for Uloric. [employer]'s covered (formulary) drugs are allopurinol tablet, probenecid tablet and probenecid-colchicine tablet. Please advise

## 2019-05-16 DIAGNOSIS — I890 Lymphedema, not elsewhere classified: Secondary | ICD-10-CM | POA: Diagnosis not present

## 2019-05-16 MED ORDER — ALLOPURINOL 100 MG PO TABS: 50.0000 mg | ORAL_TABLET | Freq: Every day | ORAL | 3 refills | Status: DC

## 2019-05-16 NOTE — Telephone Encounter (Signed)
Son notified 

## 2019-05-16 NOTE — Telephone Encounter (Signed)
D/c Uloric Start Allopurinol 100 mg - take 1/2 tab a day Thx

## 2019-05-17 DIAGNOSIS — I890 Lymphedema, not elsewhere classified: Secondary | ICD-10-CM | POA: Diagnosis not present

## 2019-05-18 DIAGNOSIS — I890 Lymphedema, not elsewhere classified: Secondary | ICD-10-CM | POA: Diagnosis not present

## 2019-05-19 DIAGNOSIS — I890 Lymphedema, not elsewhere classified: Secondary | ICD-10-CM | POA: Diagnosis not present

## 2019-06-03 ENCOUNTER — Other Ambulatory Visit: Payer: Self-pay | Admitting: Internal Medicine

## 2019-06-11 DIAGNOSIS — M170 Bilateral primary osteoarthritis of knee: Secondary | ICD-10-CM | POA: Diagnosis not present

## 2019-06-11 DIAGNOSIS — Z6841 Body Mass Index (BMI) 40.0 and over, adult: Secondary | ICD-10-CM | POA: Diagnosis not present

## 2019-06-11 DIAGNOSIS — M19011 Primary osteoarthritis, right shoulder: Secondary | ICD-10-CM | POA: Diagnosis not present

## 2019-06-18 DIAGNOSIS — Z6841 Body Mass Index (BMI) 40.0 and over, adult: Secondary | ICD-10-CM | POA: Diagnosis not present

## 2019-06-18 DIAGNOSIS — M19011 Primary osteoarthritis, right shoulder: Secondary | ICD-10-CM | POA: Diagnosis not present

## 2019-07-08 DIAGNOSIS — R609 Edema, unspecified: Secondary | ICD-10-CM | POA: Diagnosis not present

## 2019-07-08 DIAGNOSIS — I890 Lymphedema, not elsewhere classified: Secondary | ICD-10-CM | POA: Diagnosis not present

## 2019-07-16 ENCOUNTER — Other Ambulatory Visit: Payer: Self-pay | Admitting: Internal Medicine

## 2019-07-16 NOTE — Telephone Encounter (Signed)
Requested medication (s) are due for refill today: yes  Requested medication (s) are on the active medication yes   Future visit scheduled yes  Notes to clinic: review for refill   Requested Prescriptions  Pending Prescriptions Disp Refills   furosemide (LASIX) 40 MG tablet 30 tablet 11     Cardiovascular:  Diuretics - Loop Failed - 07/16/2019  3:09 PM      Failed - Cr in normal range and within 360 days    Creatinine  Date Value Ref Range Status  10/12/2017 2.2 (H) 0.6 - 1.1 mg/dL Final   Creatinine, Ser  Date Value Ref Range Status  08/09/2018 2.34 (H) 0.44 - 1.00 mg/dL Final         Failed - Valid encounter within last 6 months    Recent Outpatient Visits          8 months ago CRF (chronic renal failure), stage 4 (severe) ([place])   [facility], [name], MD   11 months ago CRF (chronic renal failure), stage 4 (severe) ([place])   [facility], [name], MD   1 year ago Need for pneumococcal vaccination   [place] Plot[facility], [name], MD   1 year ago Pulmonary hypertension ([place])   [facility], [name], MD   1 year ago Pulmonary hypertension ([place])   [facility], [name], MD             Passed - K in normal range and within 360 days    Potassium  Date Value Ref Range Status  08/09/2018 3.7 3.5 - 5.1 mmol/L Final  10/12/2017 4.4 3.5 - 5.1 mEq/L Final         Passed - Ca in normal range and within 360 days    Calcium  Date Value Ref Range Status  08/09/2018 10.2 8.9 - 10.3 mg/dL Final  10/12/2017 9.9 8.4 - 10.4 mg/dL Final         Passed - Na in normal range and within 360 days    Sodium  Date Value Ref Range Status  08/09/2018 135 135 - 145 mmol/L Final  10/12/2017 138 136 - 145 mEq/L Final         Passed - Last BP in normal range    BP Readings from Last 1 Encounters:  01/08/19  126/72          carvedilol (COREG) 6.25 MG tablet 180 tablet 3    Sig: Take 1 tablet (6.25 mg total) by mouth 2 (two) times daily with a meal.     Cardiovascular:  Beta Blockers Failed - 07/16/2019  3:09 PM      Failed - Valid encounter within last 6 months    Recent Outpatient Visits          8 months ago CRF (chronic renal failure), stage 4 (severe) ([place])   [facility], [name], MD   11 months ago CRF (chronic renal failure), stage 4 (severe) ([place])   [facility], [name], MD   1 year ago Need for pneumococcal vaccination   [place] Plotnikov, [name], MD   1 year ago Pulmonary hypertension [facility])   [facility], [name], MD   1 year ago Pulmonary hypertension [facility])   [facility], [name], MD  Passed - Last BP in normal range    BP Readings from Last 1 Encounters:  01/08/19 126/72         Passed - Last Heart Rate in normal range    Pulse Readings from Last 1 Encounters:  01/08/19 71          allopurinol (ZYLOPRIM) 100 MG tablet 45 tablet 3    Sig: Take 0.5 tablets (50 mg total) by mouth daily.     Endocrinology:  Gout Agents Failed - 07/16/2019  3:09 PM      Failed - Uric Acid in normal range and within 360 days    No results found for: POCURA, LABURIC       Failed - Cr in normal range and within 360 days    Creatinine  Date Value Ref Range Status  10/12/2017 2.2 (H) 0.6 - 1.1 mg/dL Final   Creatinine, Ser  Date Value Ref Range Status  08/09/2018 2.34 (H) 0.44 - 1.00 mg/dL Final         Passed - Valid encounter within last 12 months    Recent Outpatient Visits          8 months ago CRF (chronic renal failure), stage 4 (severe) ([place])   [facility], [name], MD   11 months ago CRF (chronic renal failure), stage 4 (severe) ([place])    [facility], [name], MD   1 year ago Need for pneumococcal vaccination   [place] Plotnikov, [name], MD   1 year ago Pulmonary hypertension ([place])   [place] Plot[facility], [name], MD   1 year ago Pulmonary hypertension ([place])   [facility], [name], MD              potassium chloride (KLOR-CON) 8 MEQ tablet 30 tablet 1    Sig: Take 1 tablet (8 mEq total) by mouth daily. **NEEDS VISIT FOR FUTURE REFILLS**     Off-Protocol Failed - 07/16/2019  3:09 PM      Failed - Medication not assigned to a protocol, review manually.      Passed - Valid encounter within last 12 months    Recent Outpatient Visits          8 months ago CRF (chronic renal failure), stage 4 (severe) ([place])   [facility], [name], MD   11 months ago CRF (chronic renal failure), stage 4 (severe) ([place])   [facility], [name], MD   1 year ago Need for pneumococcal vaccination   [place], [name], MD   1 year ago Pulmonary hypertension [facility])   [place], [name], MD   1 year ago Pulmonary hypertension ([place])   [facility], [name], MD            Endocrinology:  Minerals - Potassium Supplementation Failed - 07/16/2019  3:09 PM      Failed - Cr in normal range and within 360 days    Creatinine  Date Value Ref Range Status  10/12/2017 2.2 (H) 0.6 - 1.1 mg/dL Final   Creatinine, Ser  Date Value Ref Range Status  08/09/2018 2.34 (H) 0.44 - 1.00 mg/dL Final         Passed - K in normal range and within 360 days  Potassium  Date Value Ref Range Status  08/09/2018 3.7 3.5 - 5.1 mmol/L Final  10/12/2017 4.4 3.5 - 5.1 mEq/L Final         Passed - Valid encounter within last 12 months     Recent Outpatient Visits          8 months ago CRF (chronic renal failure), stage 4 (severe) ([place])   [facility], [name], MD   11 months ago CRF (chronic renal failure), stage 4 (severe) ([place])   [facility], [name], MD   1 year ago Need for pneumococcal vaccination   [place], [name], MD   1 year ago Pulmonary hypertension ([place])   [place] Plot[facility], [name], MD   1 year ago Pulmonary hypertension ([place])   [facility], [name], MD              warfarin (COUMADIN) 5 MG tablet 90 tablet 3    Sig: Take 1 tablet (5 mg total) by mouth daily. None on Wed.     Hematology:  Anticoagulants - warfarin Failed - 07/16/2019  3:09 PM      Failed - This refill cannot be delegated      Failed - If the patient is managed by [facility] - route to their Pool. If not, forward to the provider.      Failed - INR in normal range and within 30 days    INR  Date Value Ref Range Status  01/08/2019 2.4 2.0 - 3.0 Final  06/23/2017 1.53  Final         Failed - Valid encounter within last 3 months    Recent Outpatient Visits          8 months ago CRF (chronic renal failure), stage 4 (severe) ([place])   [facility], [name], MD   11 months ago CRF (chronic renal failure), stage 4 (severe) ([place])   [facility], [name], MD   1 year ago Need for pneumococcal vaccination   [place], [name], MD   1 year ago Pulmonary hypertension ([place])   [place] Plotnikov, [name], MD   1 year ago Pulmonary hypertension ([place])   [facility], [name], MD              lovastatin (MEVACOR) 20 MG tablet 90 tablet 3    Sig: Take 1 tablet (20 mg total)  by mouth daily.     Cardiovascular:  Antilipid - Statins Failed - 07/16/2019  3:09 PM      Failed - Total Cholesterol in normal range and within 360 days    No results found for: CHOL, POCCHOL       Failed - LDL in normal range and within 360 days    No results found for: LDLCALC, LDLC, HIRISKLDL       Failed - HDL in normal range and within 360 days    No results found for: HDL       Failed - Triglycerides in normal range and within 360 days    No results found for: TRIG       Passed - Patient is not pregnant      Passed - Valid encounter within last 12 months    Recent Outpatient  Visits          8 months ago CRF (chronic renal failure), stage 4 (severe) ([place])   [facility], [name], MD   11 months ago CRF (chronic renal failure), stage 4 (severe) ([place])   [facility], [name], MD   1 year ago Need for pneumococcal vaccination   [place] Plot[facility], [name], MD   1 year ago Pulmonary hypertension ([place])   [place] Plot[facility], [name], MD   1 year ago Pulmonary hypertension [facility])   [place] Plotnikov, [name], MD              irbesartan-hydrochlorothiazide (AVALIDE) 150-12.5 MG tablet 90 tablet 3    Sig: Take 1 tablet by mouth daily.     Cardiovascular: ARB + Diuretic Combos Failed - 07/16/2019  3:09 PM      Failed - K in normal range and within 180 days    Potassium  Date Value Ref Range Status  08/09/2018 3.7 3.5 - 5.1 mmol/L Final  10/12/2017 4.4 3.5 - 5.1 mEq/L Final         Failed - Na in normal range and within 180 days    Sodium  Date Value Ref Range Status  08/09/2018 135 135 - 145 mmol/L Final  10/12/2017 138 136 - 145 mEq/L Final         Failed - Cr in normal range and within 180 days    Creatinine  Date Value Ref Range Status  10/12/2017 2.2 (H) 0.6 - 1.1 mg/dL Final   Creatinine, Ser   Date Value Ref Range Status  08/09/2018 2.34 (H) 0.44 - 1.00 mg/dL Final         Failed - Ca in normal range and within 180 days    Calcium  Date Value Ref Range Status  08/09/2018 10.2 8.9 - 10.3 mg/dL Final  10/12/2017 9.9 8.4 - 10.4 mg/dL Final         Failed - Valid encounter within last 6 months    Recent Outpatient Visits          8 months ago CRF (chronic renal failure), stage 4 (severe) ([place])   [facility], [name], MD   11 months ago CRF (chronic renal failure), stage 4 (severe) ([place])   [facility], [name], MD   1 year ago Need for pneumococcal vaccination   [place] Plotnikov, [name], MD   1 year ago Pulmonary hypertension ([place])   [place] Plot[facility], [name], MD   1 year ago Pulmonary hypertension ([place])   [facility], [name], MD             Passed - Patient is not pregnant      Passed - Last BP in normal range    BP Readings from Last 1 Encounters:  01/08/19 126/72          levothyroxine (SYNTHROID) 50 MCG tablet 90 tablet 3    Sig: Take 1 tablet (50 mcg total) by mouth daily before breakfast.     Endocrinology:  Hypothyroid Agents Failed - 07/16/2019  3:09 PM      Failed - TSH needs to be rechecked within 3 months after an abnormal result. Refill until TSH is due.      Failed -  TSH in normal range and within 360 days    TSH  Date Value Ref Range Status  01/05/2018 6.26 (H) 0.35 - 4.50 uIU/mL Final         Passed - Valid encounter within last 12 months    Recent Outpatient Visits          8 months ago CRF (chronic renal failure), stage 4 (severe) ([place])   [facility], [name], MD   11 months ago CRF (chronic renal failure), stage 4 (severe) ([place])   [facility], [name], MD   1 year ago Need for  pneumococcal vaccination   [place], [name], MD   1 year ago Pulmonary hypertension ([place])   [place], [name], MD   1 year ago Pulmonary hypertension ([place])   [facility], [name], MD              lipase/protease/amylase (CREON) 12000 units CPEP capsule 90 capsule 3    Sig: Take 1 capsule (12,000 Units total) by mouth daily as needed (stomach problems).     Endocrinology:  Enzymes Passed - 07/16/2019  3:09 PM      Passed - Valid encounter within last 12 months    Recent Outpatient Visits          8 months ago CRF (chronic renal failure), stage 4 (severe) ([place])   [facility], [name], MD   11 months ago CRF (chronic renal failure), stage 4 (severe) ([place])   [facility], [name], MD   1 year ago Need for pneumococcal vaccination   [place], [name], MD   1 year ago Pulmonary hypertension ([place])   [place] [facility], [name], MD   1 year ago Pulmonary hypertension ([place])   [facility], [name], MD              omeprazole (PRILOSEC) 40 MG capsule 90 capsule 3    Sig: Take 1 capsule (40 mg total) by mouth daily.     Gastroenterology: Proton Pump Inhibitors Passed - 07/16/2019  3:09 PM      Passed - Valid encounter within last 12 months    Recent Outpatient Visits          8 months ago CRF (chronic renal failure), stage 4 (severe) ([place])   [facility], [name], MD   11 months ago CRF (chronic renal failure), stage 4 (severe) ([place])   [facility], [name], MD   1 year ago Need for pneumococcal vaccination   [place], [name], MD   1 year ago Pulmonary hypertension  [facility])   [place], [name], MD   1 year ago Pulmonary hypertension [facility])   [facility], [name], MD

## 2019-07-16 NOTE — Telephone Encounter (Signed)
Pt's son stated pt needs all of her rx's refilled. Please advise as she has a very large list.  [place] [place] - [address] [phone] (Phone) [phone] (Fax)

## 2019-07-17 MED ORDER — POTASSIUM CHLORIDE ER 8 MEQ PO TBCR: 8.0000 meq | EXTENDED_RELEASE_TABLET | Freq: Every day | ORAL | 0 refills | Status: DC

## 2019-07-17 MED ORDER — IRBESARTAN-HYDROCHLOROTHIAZIDE 150-12.5 MG PO TABS: 1.0000 | ORAL_TABLET | Freq: Every day | ORAL | 0 refills | Status: DC

## 2019-07-17 MED ORDER — CARVEDILOL 6.25 MG PO TABS: 6.2500 mg | ORAL_TABLET | Freq: Two times a day (BID) | ORAL | 0 refills | Status: DC

## 2019-07-17 MED ORDER — LOVASTATIN 20 MG PO TABS: 20.0000 mg | ORAL_TABLET | Freq: Every day | ORAL | 0 refills | Status: DC

## 2019-07-17 MED ORDER — LEVOTHYROXINE SODIUM 50 MCG PO TABS: 50.0000 ug | ORAL_TABLET | Freq: Every day | ORAL | 0 refills | Status: DC

## 2019-07-17 MED ORDER — ALLOPURINOL 100 MG PO TABS: 50.0000 mg | ORAL_TABLET | Freq: Every day | ORAL | 0 refills | Status: DC

## 2019-07-17 MED ORDER — OMEPRAZOLE 40 MG PO CPDR: 40.0000 mg | DELAYED_RELEASE_CAPSULE | Freq: Every day | ORAL | 0 refills | Status: DC

## 2019-07-17 MED ORDER — PANCRELIPASE (LIP-PROT-AMYL) 12000-38000 UNITS PO CPEP: 12000.0000 [IU] | ORAL_CAPSULE | Freq: Every day | ORAL | 0 refills | Status: DC | PRN

## 2019-07-17 MED ORDER — FUROSEMIDE 40 MG PO TABS: ORAL_TABLET | ORAL | 0 refills | Status: DC

## 2019-07-23 ENCOUNTER — Other Ambulatory Visit: Payer: Self-pay

## 2019-07-23 ENCOUNTER — Ambulatory Visit (INDEPENDENT_AMBULATORY_CARE_PROVIDER_SITE_OTHER): Payer: Medicare HMO | Admitting: General Practice

## 2019-07-23 ENCOUNTER — Other Ambulatory Visit: Payer: Self-pay | Admitting: General Practice

## 2019-07-23 DIAGNOSIS — R600 Localized edema: Secondary | ICD-10-CM | POA: Diagnosis not present

## 2019-07-23 DIAGNOSIS — I482 Chronic atrial fibrillation, unspecified: Secondary | ICD-10-CM

## 2019-07-23 DIAGNOSIS — Z7901 Long term (current) use of anticoagulants: Secondary | ICD-10-CM

## 2019-07-23 DIAGNOSIS — D649 Anemia, unspecified: Secondary | ICD-10-CM | POA: Diagnosis not present

## 2019-07-23 DIAGNOSIS — Z23 Encounter for immunization: Secondary | ICD-10-CM | POA: Diagnosis not present

## 2019-07-23 DIAGNOSIS — Z6841 Body Mass Index (BMI) 40.0 and over, adult: Secondary | ICD-10-CM | POA: Diagnosis not present

## 2019-07-23 DIAGNOSIS — C50919 Malignant neoplasm of unspecified site of unspecified female breast: Secondary | ICD-10-CM | POA: Diagnosis not present

## 2019-07-23 DIAGNOSIS — I129 Hypertensive chronic kidney disease with stage 1 through stage 4 chronic kidney disease, or unspecified chronic kidney disease: Secondary | ICD-10-CM | POA: Diagnosis not present

## 2019-07-23 DIAGNOSIS — M109 Gout, unspecified: Secondary | ICD-10-CM | POA: Diagnosis not present

## 2019-07-23 DIAGNOSIS — N184 Chronic kidney disease, stage 4 (severe): Secondary | ICD-10-CM | POA: Diagnosis not present

## 2019-07-23 LAB — POCT INR: INR: 2.3 (ref 2.0–3.0)

## 2019-07-23 MED ORDER — WARFARIN SODIUM 5 MG PO TABS: ORAL_TABLET | ORAL | 1 refills | Status: DC

## 2019-07-23 NOTE — Patient Instructions (Addendum)
Pre visit review using our clinic review tool, if applicable. No additional management support is needed unless otherwise documented below in the visit note.  Continue to take 1 tablet daily except take 1/2 tablet on Tuesdays and Saturdays.  Re-check in 6 weeks.

## 2019-07-24 ENCOUNTER — Other Ambulatory Visit: Payer: Self-pay

## 2019-07-26 MED ORDER — VOLTAREN 1 % TD GEL: TRANSDERMAL | 5 refills | Status: DC

## 2019-07-26 MED ORDER — LOVASTATIN 20 MG PO TABS: 20.0000 mg | ORAL_TABLET | Freq: Every day | ORAL | 3 refills | Status: DC

## 2019-07-26 MED ORDER — POTASSIUM CHLORIDE ER 8 MEQ PO TBCR: 8.0000 meq | EXTENDED_RELEASE_TABLET | Freq: Every day | ORAL | 3 refills | Status: DC

## 2019-07-26 MED ORDER — IRBESARTAN-HYDROCHLOROTHIAZIDE 150-12.5 MG PO TABS: 1.0000 | ORAL_TABLET | Freq: Every day | ORAL | 3 refills | Status: DC

## 2019-07-26 MED ORDER — TRAMADOL HCL 50 MG PO TABS: 50.0000 mg | ORAL_TABLET | Freq: Two times a day (BID) | ORAL | 3 refills | Status: DC | PRN

## 2019-07-26 MED ORDER — FUROSEMIDE 40 MG PO TABS: ORAL_TABLET | ORAL | 3 refills | Status: DC

## 2019-08-01 ENCOUNTER — Telehealth: Payer: Self-pay | Admitting: *Deleted

## 2019-08-01 ENCOUNTER — Other Ambulatory Visit: Payer: Self-pay | Admitting: Hematology

## 2019-08-01 NOTE — Telephone Encounter (Signed)
Called pt POA (son) to make aware that refill for Aromasin has been sent to pharmacy and Dr. Burr Medico advised to come to next scheduled appt in November. Pt son verbalized understanding.

## 2019-08-01 NOTE — Telephone Encounter (Signed)
Per Dr. Burr Medico desk nurse Malachy Mood, refilled prescription for Aromasin. Called pt POA (son) and informed of med refill and advised pt has to come in for next scheduled visit with Cira Rue. Pt son verbalized understanding.

## 2019-08-06 DIAGNOSIS — M19011 Primary osteoarthritis, right shoulder: Secondary | ICD-10-CM | POA: Diagnosis not present

## 2019-08-13 DIAGNOSIS — M170 Bilateral primary osteoarthritis of knee: Secondary | ICD-10-CM | POA: Diagnosis not present

## 2019-08-20 DIAGNOSIS — M170 Bilateral primary osteoarthritis of knee: Secondary | ICD-10-CM | POA: Diagnosis not present

## 2019-08-27 DIAGNOSIS — M170 Bilateral primary osteoarthritis of knee: Secondary | ICD-10-CM | POA: Diagnosis not present

## 2019-08-30 ENCOUNTER — Telehealth: Payer: Self-pay | Admitting: Internal Medicine

## 2019-08-30 NOTE — Telephone Encounter (Signed)
Rec'd from Raliegh Ip forwarded 2 pages to Dr. Wynelle Cleveland

## 2019-09-03 ENCOUNTER — Ambulatory Visit (INDEPENDENT_AMBULATORY_CARE_PROVIDER_SITE_OTHER): Payer: Medicare HMO | Admitting: General Practice

## 2019-09-03 ENCOUNTER — Other Ambulatory Visit: Payer: Self-pay

## 2019-09-03 DIAGNOSIS — Z7901 Long term (current) use of anticoagulants: Secondary | ICD-10-CM

## 2019-09-03 DIAGNOSIS — I482 Chronic atrial fibrillation, unspecified: Secondary | ICD-10-CM

## 2019-09-03 LAB — POCT INR: INR: 1.8 — AB (ref 2.0–3.0)

## 2019-09-03 NOTE — Patient Instructions (Addendum)
Pre visit review using our clinic review tool, if applicable. No additional management support is needed unless otherwise documented below in the visit note.  Take 1 tablet today (10/27) and then continue to take 1 tablet daily except take 1/2 tablet on Tuesdays and Saturdays.  Re-check in 4 weeks.

## 2019-09-22 NOTE — Progress Notes (Signed)
[place]   [phone] [phone]   Clinic Follow up Note   Patient Care Team: Plotnikov, [name], MD as PCP - General (Internal Medicine) [name], MD as Consulting Physician (General Surgery) Truitt Merle, MD as Consulting Physician (Hematology) Kyung Rudd, MD as Consulting Physician (Radiation Oncology) Estanislado Emms, MD as Consulting Physician (Nephrology) Lorretta Harp, MD as Consulting Physician (Cardiology) Verner Chol, MD as Consulting Physician (Sports Medicine) Gardenia Phlegm, NP as Nurse Practitioner (Hematology and Oncology) 09/23/2019  CHIEF COMPLAINT: F/u left breast cancer   SUMMARY OF ONCOLOGIC HISTORY: Oncology History Overview Note  Cancer Staging Malignant neoplasm of upper-outer quadrant of left breast in female, estrogen receptor positive ([place]) Staging form: Breast, AJCC 8th Edition - Clinical stage from 02/06/2017: Stage IA (cT1b(m), cN0, cM0, G1, ER: Positive, PR: Positive, HER2: Negative) - Signed by Truitt Merle, MD on 02/14/2017 - Pathologic stage from 04/04/2017: Stage IA (pT1b(m), pN0, cM0, G2, ER: Positive, PR: Positive, HER2: Negative, Oncotype DX score: 18) - Signed by Truitt Merle, MD on 06/30/2017     Malignant neoplasm of upper-outer quadrant of left breast in female, estrogen receptor positive ([place])  01/26/2017 Mammogram   Screening mammogram on 01/26/17 showed possible asymmetries in the left breast.    02/02/2017 Mammogram   Diagnostic mammogram and [place]: 1. An irregular hypoechoic mass in the left breast at 1 o'clock 7 cm from the nipple measuring 6 x 3 x 4 mm.  2. There is an irregular hypoechoic mass in left breast at 3 o'clock 7 cm from the nipple measuring 4 x 4 x 3 mm. 3. There is a subtle hypoechoic mass in the left breast at 2:30 5 cm from the nipple measuring 3 x 3 x 3 mm 4. There are multiple other small cysts in the left breast  5. Left axilla (-) on [place]    02/06/2017 Initial  Biopsy   Diagnosis 1. Breast, left, needle core biopsy, 1:00 o'clock, 7 CMFN - INVASIVE DUCTAL CARCINOMA, SEE COMMENT. - DUCTAL CARCINOMA IN SITU. 2. Breast, left, needle core biopsy, 3:00 o'clock, 7 CMFN - INVASIVE DUCTAL CARCINOMA, SEE COMMENT. - DUCTAL CARCINOMA IN SITU. Microscopic Comment 1. The carcinoma in parts #1 and 2 appear morphologically similar and grade 1.   02/06/2017 Receptors her2   Both biopsy ER 100%+, PR 100%+, HER2-, Ki67 10%   02/06/2017 Initial Diagnosis   Malignant neoplasm of upper-outer quadrant of left breast in female, estrogen receptor positive ([place])   04/04/2017 Surgery   Left breast lumpectomy and SLN biopsy    04/04/2017 Pathology Results   Diagnosis 1. Breast, lumpectomy, Left - MULTIFOCAL INVASIVE DUCTAL CARCINOMA, NOTTINGHAM GRADE 2 OF 3, 0.9 CM - DUCTAL CARCINOMA IN SITU, LOW GRADE (<1MM FROM MEDIAL MARGIN) - FIBROCYSTIC AND COLUMNAR CELL CHANGE - CALCIFICATIONS ASSOCIATED WITH CARCINOMA AND BENIGN DISEASE - MARGINS UNINVOLVED BY CARCINOMA - PREVIOUS BIOPSY SITE CHANGES - SEE ONCOLOGY TABLE BELOW 2. Lymph node, sentinel, biopsy, Left Axillary #1 - NO CARCINOMA IDENTIFIED IN ONE LYMPH NODE (0/1) 3. Lymph node, sentinel, biopsy, Left Axillary #2 - NO CARCINOMA IDENTIFIED IN ONE LYMPH NODE (0/1)   04/04/2017 Oncotype testing   RS 18, which predicts 10-year distant recurrence risk of 12% with tamoxifen    04/26/2017 Surgery   Re-excision for close margin, final pathyology was negative for cancer    06/06/2017 - 07/19/2017 Radiation Therapy   The Left breast was treated to 42.5 Gy in 17 fractions at 2.5 Gy per fraction.  2. The Left breast was boosted to 7.5 Gy in 3 fractions at 2.5 Gy per fraction.      06/18/2017 - 06/23/2017 Hospital Admission   Admit date: 06/18/17 Admission diagnosis: Intractable lower back pain- sciatica  Additional comments: Sent home with course of steroids and referral for f/u for back injections. Did have AKI w/ CR at  2/0, up from her baseline of 1.6. This slowed with IVF. Hypokalemia improved with K replacement. Radiotherapy was stopped at that time as she was unable to lay on her back due to pain.    08/2017 -  Anti-estrogen oral therapy   Exemestane 25 mg dialy starting 08/2017     08/28/2017 Imaging   BONE DENSITY SCAN  ASSESSMENT: The BMD measured at Femur Neck Left is 0.848 g/cm2 with a T-score of -1.4.   DualFemur Neck Left 08/28/2017    71.7         -1.4    0.848 g/cm2 AP Spine  L1-L3     08/28/2017    71.7         -1.1    1.052 g/cm2   01/29/2018 Mammogram   IMPRESSION: 1. No mammographic evidence of breast malignancy. 2. Surgical and radiation changes within the LEFT breast.     CURRENT THERAPY: Exemestane 25 mg once daily, starting 08/2017  INTERVAL HISTORY: Ms. Riles returns for f/u as scheduled. She was last seen by Dr. Burr Medico in 08/2018. Her mammogram in 01/2019 was negative. She feels well, denies changes in health since last year. She continues Aromasin. No hot flashes in 3-4 months. Right knee pain is at baseline, no worse on AI. She continues injections per ortho. Denies concerns in her breasts, does self exam in the shower. Denies change in appetite or bowel habits. Denies bleeding, fever, chills, cough, chest pain, or dyspnea.    MEDICAL HISTORY:  Past Medical History:  Diagnosis Date  . Arthritis   . Cancer [facility])    breast cancer  . Chronic atrial fibrillation   . Chronic kidney disease    sees Dr Florene Glen  . Cramps, muscle, general   . Dyspnea   . Dyspnea on exertion   . Dysrhythmia   . GERD (gastroesophageal reflux disease)   . Headache   . Hyperlipidemia   . Hypertension   . Hypothyroidism   . Lymphedema   . Moderate to severe pulmonary hypertension ([place])   . Obesity   . Personal history of radiation therapy     SURGICAL HISTORY: Past Surgical History:  Procedure Laterality Date  . APPENDECTOMY    . BREAST BIOPSY Left 2018  . BREAST LUMPECTOMY Left  04/04/2017  . BREAST LUMPECTOMY WITH NEEDLE LOCALIZATION AND AXILLARY SENTINEL LYMPH NODE BX Left 04/04/2017   Procedure: BREAST LUMPECTOMY WITH NEEDLE LOCALIZATION x3 AND AXILLARY SENTINEL LYMPH NODE BX;  Surgeon: [name], MD;  Location: [facility];  Service: General;  Laterality: Left;  . CATARACT EXTRACTION W/ INTRAOCULAR LENS  IMPLANT, BILATERAL Bilateral 2018  . COLONOSCOPY    . EYE SURGERY    . GLAUCOMA SURGERY Bilateral 2018  . RE-EXCISION OF BREAST CANCER,SUPERIOR MARGINS Left 04/26/2017   Procedure: RE-EXCISION OF LEFT BREAST CANCER;  Surgeon: [name], MD;  Location: [facility];  Service: General;  Laterality: Left;    I have reviewed the social history and family history with the patient and they are unchanged from previous note.  ALLERGIES:  is allergic to chlorhexidine gluconate and penicillins.  MEDICATIONS:  Current Outpatient Medications  Medication  Sig Dispense Refill  . allopurinol (ZYLOPRIM) 100 MG tablet Take 0.5 tablets (50 mg total) by mouth daily. 45 tablet 0  . Calcium-Magnesium-Vitamin D (CALCIUM 500 PO) Take 1 tablet by mouth daily.    . carvedilol (COREG) 6.25 MG tablet Take 1 tablet (6.25 mg total) by mouth 2 (two) times daily with a meal. 180 tablet 0  . Cholecalciferol (VITAMIN D3) 2000 units capsule Take 1 capsule (2,000 Units total) by mouth daily. 100 capsule 3  . Cyanocobalamin (VITAMIN B-12 PO) Take 1 tablet by mouth daily.    [name] exemestane (AROMASIN) 25 MG tablet TAKE ONE (1) TABLET BY MOUTH EVERY DAY AFTER BREAKFAST 90 tablet 3  . ferrous sulfate 325 (65 FE) MG tablet Take 325 mg by mouth daily with breakfast.    . furosemide (LASIX) 40 MG tablet TAKE ONE (1) TABLET BY MOUTH EVERY DAY AS NEEDED 90 tablet 3  . irbesartan-hydrochlorothiazide (AVALIDE) 150-12.5 MG tablet Take 1 tablet by mouth daily. 90 tablet 3  . levothyroxine (SYNTHROID) 50 MCG tablet Take 1 tablet (50 mcg total) by mouth daily before breakfast. 90 tablet 0  . lidocaine (LIDODERM) 5 %  Place 1-2 patches onto the skin daily. Remove & Discard patch within 12 hours or as directed by MD 60 patch 5  . lipase/protease/amylase (CREON) 12000 units CPEP capsule Take 1 capsule (12,000 Units total) by mouth daily as needed (stomach problems). 90 capsule 0  . lovastatin (MEVACOR) 20 MG tablet Take 1 tablet (20 mg total) by mouth daily. 90 tablet 3  . methocarbamol (ROBAXIN) 500 MG tablet Take 1 tablet (500 mg total) by mouth 4 (four) times daily. 120 tablet 5  . omeprazole (PRILOSEC) 40 MG capsule Take 1 capsule (40 mg total) by mouth daily. 90 capsule 0  . polyethylene glycol powder (GLYCOLAX/MIRALAX) powder mix 1 capful (17 grams) IN 8 ounces OF liquid EVERY DAY 1581 g 3  . potassium chloride (KLOR-CON) 8 MEQ tablet Take 1 tablet (8 mEq total) by mouth daily. 90 tablet 3  . traMADol (ULTRAM) 50 MG tablet Take 1 tablet (50 mg total) by mouth every 12 (twelve) hours as needed for severe pain. 60 tablet 3  . VOLTAREN 1 % GEL apply 4 grams 4 TIMES DAILY AS NEEDED FOR 30 DAYS 100 g 5  . warfarin (COUMADIN) 5 MG tablet Take 1 tablet daily except take 1/2 tablet on Wed and Sat or Take as directed by anticoagulation clinic 90 tablet 1   No current facility-administered medications for this visit.     PHYSICAL EXAMINATION: ECOG PERFORMANCE STATUS: 0 - Asymptomatic  Vitals:   09/23/19 1118  BP: (!) 154/68  Pulse: 84  Resp: 18  Temp: 98.5 F (36.9 C)  SpO2: 99%   Filed Weights   09/23/19 1118  Weight: 293 lb (132.9 kg)    GENERAL:alert, no distress and comfortable SKIN: no rash  EYES: sclera clear LYMPH:  no palpable cervical or supraclavicular lymphadenopathy  LUNGS: respirations even and unlabored with normal breathing effort HEART: bilateral lower extremity edema ABDOMEN: obese NEURO: alert & oriented x 3 with fluent speech Patient declined to get on the exam table, breast exam done in wheelchair. S/p left lumpectomy and radiation with expected skin thickening. No palpable  mass in either breast or axilla that I could appreciate.   LABORATORY DATA:  I have reviewed the data as listed CBC Latest Ref Rng & Units 08/09/2018 02/07/2018 10/12/2017  WBC 3.9 - 10.3 K/uL 5.8 5.5 7.5  Hemoglobin 11.6 -  15.9 g/dL 10.3(L) 11.1(L) 10.5(L)  Hematocrit 34.8 - 46.6 % 32.5(L) 36.1 33.7(L)  Platelets 145 - 400 K/uL 217 204 173     CMP Latest Ref Rng & Units 08/09/2018 02/07/2018 01/05/2018  Glucose 70 - 99 mg/dL 95 87 97  BUN 8 - 23 mg/dL 66(H) 51(H) 47(H)  Creatinine 0.44 - 1.00 mg/dL 2.34(H) 2.00(H) 2.21(H)  Sodium 135 - 145 mmol/L 135 137 139  Potassium 3.5 - 5.1 mmol/L 3.7 4.0 4.4  Chloride 98 - 111 mmol/L 97(L) 103 103  CO2 22 - 32 mmol/L 27 25 28   Calcium 8.9 - 10.3 mg/dL 10.2 9.9 10.4  Total Protein 6.5 - 8.1 g/dL 7.6 7.4 7.8  Total Bilirubin 0.3 - 1.2 mg/dL 0.7 0.7 0.8  Alkaline Phos 38 - 126 U/L 146(H) 173(H) 146(H)  AST 15 - 41 U/L 16 25 15   ALT 0 - 44 U/L 13 36 13      RADIOGRAPHIC STUDIES: I have personally reviewed the radiological images as listed and agreed with the findings in the report. No results found.   ASSESSMENT & PLAN: 73 y.o. Caucasian woman with screening detected left breast cancer  1. Malignant neoplasm of upper-outer quadrant of left breast, invasive ductal carcinoma,  stage IA (pT1b(m)N0), grade 2, ER+,PR+,HER2-, oncotype RS 18 -s/p left lumpectomy on 04/04/2017 and re-excision of close margin on 6/202/018, she has had complete surgical resection, surgical margins were negative. Lymph nodes were negative. -the Oncotype Dx result shows intermedia risk based on the recurrence score, which predicts 10 year distant recurrence after 5 years of tamoxifen 12%. The benefit of chemotherapy in the intermedia risk group is small and controversial. Given her relatively low score in the intermedia group, Dr. Burr Medico did not recommend adjuvant chemotherapy. Patient agreed -S/p adjuvant radiaiton 06/06/2017 to 07/19/2017 with Dr. Lisbeth Renshaw and tolerated well.  -She  started Exemestane in 08/2017, tolerating well without significant toxicity  -we reviewed her mammogram from 01/2019 is negative for malignancy -Ms. Owensby is clinically doing well. Breast exam is benign, showing post lumpectomy and RT changes, no concern for recurrence. She will continue annual mammogram  -Continue breast cancer surveillance and Aromasin.  -she will see Dr. Barry Dienes in 01/2020 after next mammogram, plan to see [place] 3 months after that  -will obtain recent labs from [place] [facility]   2. Chronic atrial fibrillation, hyperlipidemia, HTN, lymphedema, pulmonary hypertension, obesity, hypothyroidism -Managed by her PCP and Cardiologist (Dr. Gwenlyn Found) -On Lasix, Lovastatin, Diovan-HCT, Coumadin 5 mg, and synthroid.  3. Anemia of chronic disease (CKD) and iron deficiency  - iron level in December 2018, ferritin was 33, relatively low given her CKD.  -She started oral ferrous sulfate twice daily initially, now taking once daily  -Hemoglobin 10.3 in 08/2018, will obtain outside labs   4. Osteoarthritis, arthralgia -followed by ortho at [facility], s/p injections  -pain is stable on AI -monitoring   5. Osteopenia  -08/2017 DEXA showed osteopenia with a T-Score of -1.4 at the left femur neck -she takes calcium and vitamin D -will repeat DEXA which is due now, at breast center   PLAN: -Obtain recent labs from [place] [facility] -Continue breast cancer surveillance -F/u with Dr. Barry Dienes in 01/2020 -Mammogram due 01/2020, can do DEXA at same time if not done sooner. Orders placed  -Continue Aromasin -F/u with Dr. Burr Medico in 7 months   No problem-specific Assessment & Plan notes found for this encounter.   Orders Placed This Encounter  Procedures  . MM DIAG BREAST TOMO BILATERAL  Standing Status:   Future    Standing Expiration Date:   09/22/2020    Order Specific Question:   Reason for Exam (SYMPTOM  OR DIAGNOSIS REQUIRED)    Answer:   h/o left breast cancer s/p  lumpectomy, RT and on AI    Order Specific Question:   Preferred imaging location?    Answer:   [facility]  . DG Bone Density    Standing Status:   Future    Standing Expiration Date:   09/22/2020    Order Specific Question:   Reason for Exam (SYMPTOM  OR DIAGNOSIS REQUIRED)    Answer:   osteopenia    Order Specific Question:   Preferred imaging location?    Answer:   [facility]   All questions were answered. The patient knows to call the clinic with any problems, questions or concerns. No barriers to learning was detected.     Alla Feeling, NP 09/23/19

## 2019-09-23 ENCOUNTER — Inpatient Hospital Stay: Payer: Medicare HMO | Attending: Hematology | Admitting: Nurse Practitioner

## 2019-09-23 ENCOUNTER — Encounter: Payer: Self-pay | Admitting: Nurse Practitioner

## 2019-09-23 ENCOUNTER — Telehealth: Payer: Self-pay | Admitting: Nurse Practitioner

## 2019-09-23 ENCOUNTER — Other Ambulatory Visit: Payer: Self-pay

## 2019-09-23 VITALS — BP 154/68 | HR 84 | Temp 98.5°F | Resp 18 | Wt 293.0 lb

## 2019-09-23 DIAGNOSIS — M1990 Unspecified osteoarthritis, unspecified site: Secondary | ICD-10-CM | POA: Diagnosis not present

## 2019-09-23 DIAGNOSIS — Z79811 Long term (current) use of aromatase inhibitors: Secondary | ICD-10-CM | POA: Insufficient documentation

## 2019-09-23 DIAGNOSIS — M8580 Other specified disorders of bone density and structure, unspecified site: Secondary | ICD-10-CM | POA: Diagnosis not present

## 2019-09-23 DIAGNOSIS — I890 Lymphedema, not elsewhere classified: Secondary | ICD-10-CM | POA: Diagnosis not present

## 2019-09-23 DIAGNOSIS — I482 Chronic atrial fibrillation, unspecified: Secondary | ICD-10-CM | POA: Diagnosis not present

## 2019-09-23 DIAGNOSIS — E669 Obesity, unspecified: Secondary | ICD-10-CM | POA: Insufficient documentation

## 2019-09-23 DIAGNOSIS — Z7901 Long term (current) use of anticoagulants: Secondary | ICD-10-CM | POA: Diagnosis not present

## 2019-09-23 DIAGNOSIS — M25561 Pain in right knee: Secondary | ICD-10-CM | POA: Diagnosis not present

## 2019-09-23 DIAGNOSIS — C50412 Malignant neoplasm of upper-outer quadrant of left female breast: Secondary | ICD-10-CM | POA: Diagnosis present

## 2019-09-23 DIAGNOSIS — R600 Localized edema: Secondary | ICD-10-CM | POA: Diagnosis not present

## 2019-09-23 DIAGNOSIS — Z170 Estrogen receptor positive status [ER+]: Secondary | ICD-10-CM | POA: Insufficient documentation

## 2019-09-23 DIAGNOSIS — Z79899 Other long term (current) drug therapy: Secondary | ICD-10-CM | POA: Insufficient documentation

## 2019-09-23 DIAGNOSIS — E611 Iron deficiency: Secondary | ICD-10-CM | POA: Diagnosis not present

## 2019-09-23 DIAGNOSIS — M85852 Other specified disorders of bone density and structure, left thigh: Secondary | ICD-10-CM

## 2019-09-23 DIAGNOSIS — N189 Chronic kidney disease, unspecified: Secondary | ICD-10-CM | POA: Insufficient documentation

## 2019-09-23 DIAGNOSIS — D631 Anemia in chronic kidney disease: Secondary | ICD-10-CM | POA: Insufficient documentation

## 2019-09-23 DIAGNOSIS — Z880 Allergy status to penicillin: Secondary | ICD-10-CM | POA: Insufficient documentation

## 2019-09-23 NOTE — Telephone Encounter (Signed)
Scheduled appt per 11/16 los.  Printed calendar per pt request.

## 2019-10-02 ENCOUNTER — Telehealth: Payer: Self-pay | Admitting: Internal Medicine

## 2019-10-02 LAB — POCT INR: INR: 1.9 — AB (ref 2.0–3.0)

## 2019-10-02 NOTE — Telephone Encounter (Signed)
Jenny Reichmann is out of the office until 10/07/2019  Home INR 10/01/2019  1.9 INR Prescribed range 2-3

## 2019-10-05 NOTE — Telephone Encounter (Signed)
Take 1 tablet of Coumadin daily except for 1/2 tablet on Wednesdays. Thanks

## 2019-10-08 ENCOUNTER — Ambulatory Visit: Payer: Medicare HMO

## 2019-10-09 ENCOUNTER — Ambulatory Visit (INDEPENDENT_AMBULATORY_CARE_PROVIDER_SITE_OTHER): Payer: Medicare HMO | Admitting: General Practice

## 2019-10-09 DIAGNOSIS — Z7901 Long term (current) use of anticoagulants: Secondary | ICD-10-CM

## 2019-10-09 DIAGNOSIS — I482 Chronic atrial fibrillation, unspecified: Secondary | ICD-10-CM

## 2019-10-09 NOTE — Patient Instructions (Signed)
Pre visit review using our clinic review tool, if applicable. No additional management support is needed unless otherwise documented below in the visit note.  Patient was asked by Dr. [name] to take an additional tablet (5 mg) and to then continue to take 1 tablet daily except take 1/2 tablet on Tuesdays and Saturdays.  Follow up with Villa Herb, RN.  Re-check in 2 weeks.

## 2019-10-25 ENCOUNTER — Ambulatory Visit (INDEPENDENT_AMBULATORY_CARE_PROVIDER_SITE_OTHER): Payer: Medicare HMO | Admitting: General Practice

## 2019-10-25 DIAGNOSIS — Z7901 Long term (current) use of anticoagulants: Secondary | ICD-10-CM | POA: Diagnosis not present

## 2019-10-25 DIAGNOSIS — I482 Chronic atrial fibrillation, unspecified: Secondary | ICD-10-CM

## 2019-10-25 LAB — POCT INR: INR: 3 (ref 2.0–3.0)

## 2019-10-25 NOTE — Progress Notes (Signed)
Medical screening examination/treatment/procedure(s) were performed by non-physician practitioner and as supervising physician I was immediately available for consultation/collaboration. I agree with above. Quashawn Jewkes, MD   

## 2019-10-25 NOTE — Patient Instructions (Signed)
Pre visit review using our clinic review tool, if applicable. No additional management support is needed unless otherwise documented below in the visit note.  Continue to take 1 tablet daily except take 1/2 tablet on Tuesdays and Saturdays.  Re-check on 12/30.  Dosing instructions given to patient's son, [name] and he did verbalize understanding.

## 2019-11-02 ENCOUNTER — Other Ambulatory Visit: Payer: Self-pay | Admitting: Internal Medicine

## 2019-11-02 DIAGNOSIS — Z7901 Long term (current) use of anticoagulants: Secondary | ICD-10-CM

## 2019-11-06 MED ORDER — WARFARIN SODIUM 5 MG PO TABS: ORAL_TABLET | ORAL | 1 refills | Status: DC

## 2019-11-25 ENCOUNTER — Encounter: Payer: Self-pay | Admitting: Internal Medicine

## 2019-11-25 ENCOUNTER — Ambulatory Visit (INDEPENDENT_AMBULATORY_CARE_PROVIDER_SITE_OTHER): Payer: Medicare HMO | Admitting: Internal Medicine

## 2019-11-25 DIAGNOSIS — I10 Essential (primary) hypertension: Secondary | ICD-10-CM | POA: Diagnosis not present

## 2019-11-25 DIAGNOSIS — K219 Gastro-esophageal reflux disease without esophagitis: Secondary | ICD-10-CM

## 2019-11-25 DIAGNOSIS — I482 Chronic atrial fibrillation, unspecified: Secondary | ICD-10-CM | POA: Diagnosis not present

## 2019-11-25 DIAGNOSIS — E785 Hyperlipidemia, unspecified: Secondary | ICD-10-CM

## 2019-11-25 DIAGNOSIS — I890 Lymphedema, not elsewhere classified: Secondary | ICD-10-CM | POA: Diagnosis not present

## 2019-11-25 MED ORDER — LEVOTHYROXINE SODIUM 50 MCG PO TABS: 50.0000 ug | ORAL_TABLET | Freq: Every day | ORAL | 3 refills | Status: DC

## 2019-11-25 MED ORDER — TRAMADOL HCL 50 MG PO TABS: 50.0000 mg | ORAL_TABLET | Freq: Two times a day (BID) | ORAL | 3 refills | Status: DC | PRN

## 2019-11-25 MED ORDER — LOVASTATIN 20 MG PO TABS: 20.0000 mg | ORAL_TABLET | Freq: Every day | ORAL | 3 refills | Status: DC

## 2019-11-25 MED ORDER — CARVEDILOL 6.25 MG PO TABS: 6.2500 mg | ORAL_TABLET | Freq: Two times a day (BID) | ORAL | 3 refills | Status: DC

## 2019-11-25 MED ORDER — OMEPRAZOLE 40 MG PO CPDR: 40.0000 mg | DELAYED_RELEASE_CAPSULE | Freq: Every day | ORAL | 3 refills | Status: DC

## 2019-11-25 NOTE — Assessment & Plan Note (Signed)
On Prilosec

## 2019-11-25 NOTE — Assessment & Plan Note (Signed)
On lovastatin 

## 2019-11-25 NOTE — Assessment & Plan Note (Signed)
Lasix, Avalide, Coreg 

## 2019-11-25 NOTE — Assessment & Plan Note (Signed)
She has been using pneumatic compression system for leg swelling with good results.

## 2019-11-25 NOTE — Progress Notes (Signed)
Virtual Visit via Video Note  I connected with [name] on 11/25/19 at 10:00 AM EST by a video enabled telemedicine application and verified that I am speaking with the correct person using two identifiers.   I discussed the limitations of evaluation and management by telemedicine and the availability of in person appointments. The patient expressed understanding and agreed to proceed.  History of Present Illness: We need to follow-up on edema, HTN, GERD f/u.  Katelyn Jackson needs me to renew 5 prescriptions.  She has been using pneumatic compression system for leg swelling with good results.  There has been no runny nose, cough, chest pain, shortness of breath, abdominal pain, diarrhea, constipation, arthralgias, skin rashes.   Observations/Objective: The patient appears to be in no acute distress, looks ok.  Assessment and Plan:  See my Assessment and Plan. Follow Up Instructions:    I discussed the assessment and treatment plan with the patient. The patient was provided an opportunity to ask questions and all were answered. The patient agreed with the plan and demonstrated an understanding of the instructions.   The patient was advised to call back or seek an in-person evaluation if the symptoms worsen or if the condition fails to improve as anticipated.  I provided face-to-face time during this encounter. We were at different locations.   Walker Kehr, MD

## 2019-11-25 NOTE — Assessment & Plan Note (Signed)
On Coumadin 

## 2019-11-26 DIAGNOSIS — I4821 Permanent atrial fibrillation: Secondary | ICD-10-CM | POA: Diagnosis not present

## 2019-11-26 DIAGNOSIS — Z7901 Long term (current) use of anticoagulants: Secondary | ICD-10-CM | POA: Diagnosis not present

## 2019-11-29 ENCOUNTER — Ambulatory Visit (INDEPENDENT_AMBULATORY_CARE_PROVIDER_SITE_OTHER): Payer: Self-pay | Admitting: General Practice

## 2019-11-29 DIAGNOSIS — Z7901 Long term (current) use of anticoagulants: Secondary | ICD-10-CM

## 2019-11-29 DIAGNOSIS — I482 Chronic atrial fibrillation, unspecified: Secondary | ICD-10-CM

## 2019-11-29 LAB — POCT INR: INR: 3 (ref 2.0–3.0)

## 2019-11-29 NOTE — Patient Instructions (Addendum)
Pre visit review using our clinic review tool, if applicable. No additional management support is needed unless otherwise documented below in the visit note.  Continue to take 1 tablet daily except take 1/2 tablet on Tuesdays and Saturdays.  Re-check in 2 weeks.  Patient monitors with [employer].  Dosing instructions have been given to patient.

## 2019-12-13 ENCOUNTER — Ambulatory Visit (INDEPENDENT_AMBULATORY_CARE_PROVIDER_SITE_OTHER): Payer: Medicare HMO | Admitting: General Practice

## 2019-12-13 DIAGNOSIS — I482 Chronic atrial fibrillation, unspecified: Secondary | ICD-10-CM | POA: Diagnosis not present

## 2019-12-13 DIAGNOSIS — Z7901 Long term (current) use of anticoagulants: Secondary | ICD-10-CM | POA: Diagnosis not present

## 2019-12-13 LAB — POCT INR: INR: 4.4 — AB (ref 2.0–3.0)

## 2019-12-13 NOTE — Progress Notes (Signed)
Medical screening examination/treatment/procedure(s) were performed by non-physician practitioner and as supervising physician I was immediately available for consultation/collaboration. I agree with above. Baldo Hufnagle, MD   

## 2019-12-13 NOTE — Patient Instructions (Signed)
Pre visit review using our clinic review tool, if applicable. No additional management support is needed unless otherwise documented below in the visit note.  Hold coumadin today and tomorrow and then change dosage and take 1 tablet daily except 1/2 tablet on Tues, Thurs and Sat.  Re-check INR on Tuesday 2/17.  Call Villa Herb, RN with results as well as reporting to [place]. Pt monitors with [employer].  Dosing instructions have been given to patient's son, [name].

## 2019-12-31 ENCOUNTER — Ambulatory Visit (INDEPENDENT_AMBULATORY_CARE_PROVIDER_SITE_OTHER): Payer: Medicare HMO | Admitting: General Practice

## 2019-12-31 DIAGNOSIS — Z7901 Long term (current) use of anticoagulants: Secondary | ICD-10-CM | POA: Diagnosis not present

## 2019-12-31 DIAGNOSIS — I482 Chronic atrial fibrillation, unspecified: Secondary | ICD-10-CM

## 2019-12-31 LAB — POCT INR: INR: 2.2 (ref 2.0–3.0)

## 2019-12-31 NOTE — Patient Instructions (Addendum)
Pre visit review using our clinic review tool, if applicable. No additional management support is needed unless otherwise documented below in the visit note.  Continue to take 1 tablet daily except 1/2 tablet on Tues, Thurs and Sat.  Re-check INR in 2 weeks.  Call Villa Herb, RN with results as well as reporting to [place]. Pt monitors with [employer].  Dosing instructions have been given to patient's son, [name].

## 2020-01-21 ENCOUNTER — Ambulatory Visit (INDEPENDENT_AMBULATORY_CARE_PROVIDER_SITE_OTHER): Payer: Medicare HMO | Admitting: General Practice

## 2020-01-21 DIAGNOSIS — Z7901 Long term (current) use of anticoagulants: Secondary | ICD-10-CM | POA: Diagnosis not present

## 2020-01-21 LAB — POCT INR: INR: 2.3 (ref 2.0–3.0)

## 2020-01-21 NOTE — Patient Instructions (Signed)
Pre visit review using our clinic review tool, if applicable. No additional management support is needed unless otherwise documented below in the visit note. Continue to take 1 tablet daily except 1/2 tablet on Tues, Thurs and Sat.  Re-check INR in 2 weeks.  Call Villa Herb, RN with results as well as reporting to [place]. Pt monitors with [employer].  Dosing instructions have been given to patient's son, [name].

## 2020-02-03 ENCOUNTER — Other Ambulatory Visit: Payer: Medicare HMO

## 2020-02-03 NOTE — Progress Notes (Signed)
Medical screening examination/treatment/procedure(s) were performed by non-physician practitioner and as supervising physician I was immediately available for consultation/collaboration. I agree with above. Temara Lanum, MD  

## 2020-02-04 DIAGNOSIS — Z7901 Long term (current) use of anticoagulants: Secondary | ICD-10-CM | POA: Diagnosis not present

## 2020-02-04 DIAGNOSIS — I4821 Permanent atrial fibrillation: Secondary | ICD-10-CM | POA: Diagnosis not present

## 2020-02-05 ENCOUNTER — Ambulatory Visit (INDEPENDENT_AMBULATORY_CARE_PROVIDER_SITE_OTHER): Payer: Medicare HMO | Admitting: General Practice

## 2020-02-05 DIAGNOSIS — Z7901 Long term (current) use of anticoagulants: Secondary | ICD-10-CM

## 2020-02-05 DIAGNOSIS — I482 Chronic atrial fibrillation, unspecified: Secondary | ICD-10-CM

## 2020-02-05 LAB — POCT INR: INR: 3.5 — AB (ref 2.0–3.0)

## 2020-02-05 NOTE — Patient Instructions (Signed)
Pre visit review using our clinic review tool, if applicable. No additional management support is needed unless otherwise documented below in the visit note.  Hold coumadin today and then continue to take 1 tablet daily except 1/2 tablet on Tues, Thurs and Sat.  Re-check INR in 1 week..  Call Villa Herb, RN with results as well as reporting to [place]. Pt monitors with [employer].  Dosing instructions have been given to patient's son, [name].

## 2020-02-05 NOTE — Progress Notes (Signed)
I have reviewed the results and agree with this plan   

## 2020-02-12 LAB — POCT INR: INR: 2.7 (ref 2.0–3.0)

## 2020-02-18 ENCOUNTER — Other Ambulatory Visit: Payer: Self-pay

## 2020-02-18 ENCOUNTER — Encounter: Payer: Self-pay | Admitting: Internal Medicine

## 2020-02-18 ENCOUNTER — Ambulatory Visit (INDEPENDENT_AMBULATORY_CARE_PROVIDER_SITE_OTHER): Payer: Medicare HMO | Admitting: Internal Medicine

## 2020-02-18 DIAGNOSIS — Z7901 Long term (current) use of anticoagulants: Secondary | ICD-10-CM | POA: Diagnosis not present

## 2020-02-18 DIAGNOSIS — I890 Lymphedema, not elsewhere classified: Secondary | ICD-10-CM | POA: Diagnosis not present

## 2020-02-18 DIAGNOSIS — I10 Essential (primary) hypertension: Secondary | ICD-10-CM | POA: Diagnosis not present

## 2020-02-18 DIAGNOSIS — N184 Chronic kidney disease, stage 4 (severe): Secondary | ICD-10-CM | POA: Diagnosis not present

## 2020-02-18 DIAGNOSIS — B029 Zoster without complications: Secondary | ICD-10-CM | POA: Insufficient documentation

## 2020-02-18 DIAGNOSIS — E785 Hyperlipidemia, unspecified: Secondary | ICD-10-CM

## 2020-02-18 MED ORDER — ACYCLOVIR 800 MG PO TABS: 800.0000 mg | ORAL_TABLET | Freq: Three times a day (TID) | ORAL | 3 refills | Status: AC

## 2020-02-18 MED ORDER — ACYCLOVIR 800 MG PO TABS: 800.0000 mg | ORAL_TABLET | Freq: Every day | ORAL | 3 refills | Status: DC

## 2020-02-18 NOTE — Assessment & Plan Note (Signed)
Wt Readings from Last 3 Encounters:  02/18/20 (!) 309 lb (140.2 kg)  09/23/19 293 lb (132.9 kg)  01/08/19 290 lb (131.5 kg)

## 2020-02-18 NOTE — Assessment & Plan Note (Signed)
Lasix, Avalide, Coreg 

## 2020-02-18 NOTE — Assessment & Plan Note (Addendum)
Lovastatin 

## 2020-02-18 NOTE — Progress Notes (Signed)
Subjective:  Patient ID: [name], female    DOB: [birth year]  Age: 74 y.o. MRN: [id number]  CC: No chief complaint on file.   HPI Marilin Kofman presents for rash - H zoster H/o recurrent herpes zoster Follow-up renal insufficiency, hypertension  Outpatient Medications Prior to Visit  Medication Sig Dispense Refill  . allopurinol (ZYLOPRIM) 100 MG tablet Take 0.5 tablets (50 mg total) by mouth daily. 45 tablet 0  . Calcium-Magnesium-Vitamin D (CALCIUM 500 PO) Take 1 tablet by mouth daily.    . carvedilol (COREG) 6.25 MG tablet Take 1 tablet (6.25 mg total) by mouth 2 (two) times daily with a meal. Overdue for Annual appt must see provider for future refills 180 tablet 3  . Cholecalciferol (VITAMIN D3) 2000 units capsule Take 1 capsule (2,000 Units total) by mouth daily. 100 capsule 3  . Cyanocobalamin (VITAMIN B-12 PO) Take 1 tablet by mouth daily.    [name] exemestane (AROMASIN) 25 MG tablet TAKE ONE (1) TABLET BY MOUTH EVERY DAY AFTER BREAKFAST 90 tablet 3  . ferrous sulfate 325 (65 FE) MG tablet Take 325 mg by mouth daily with breakfast.    . furosemide (LASIX) 40 MG tablet TAKE ONE (1) TABLET BY MOUTH EVERY DAY AS NEEDED 90 tablet 3  . irbesartan-hydrochlorothiazide (AVALIDE) 150-12.5 MG tablet Take 1 tablet by mouth daily. 90 tablet 3  . levothyroxine (SYNTHROID) 50 MCG tablet Take 1 tablet (50 mcg total) by mouth daily before breakfast. Overdue for Annual appt must see provider for future refills 90 tablet 3  . lidocaine (LIDODERM) 5 % Place 1-2 patches onto the skin daily. Remove & Discard patch within 12 hours or as directed by MD 60 patch 5  . lipase/protease/amylase (CREON) 12000 units CPEP capsule Take 1 capsule (12,000 Units total) by mouth daily as needed (stomach problems). 90 capsule 0  . lovastatin (MEVACOR) 20 MG tablet Take 1 tablet (20 mg total) by mouth daily. 90 tablet 3  . methocarbamol (ROBAXIN) 500 MG tablet Take 1 tablet (500 mg total) by mouth 4 (four)  times daily. 120 tablet 5  . omeprazole (PRILOSEC) 40 MG capsule Take 1 capsule (40 mg total) by mouth daily. 90 capsule 3  . polyethylene glycol powder (GLYCOLAX/MIRALAX) powder mix 1 capful (17 grams) IN 8 ounces OF liquid EVERY DAY 1581 g 3  . potassium chloride (KLOR-CON) 8 MEQ tablet Take 1 tablet (8 mEq total) by mouth daily. 90 tablet 3  . traMADol (ULTRAM) 50 MG tablet Take 1 tablet (50 mg total) by mouth every 12 (twelve) hours as needed for severe pain. 60 tablet 3  . VOLTAREN 1 % GEL apply 4 grams 4 TIMES DAILY AS NEEDED FOR 30 DAYS 100 g 5  . warfarin (COUMADIN) 5 MG tablet Take 1 tablet daily except take 1/2 tablet on Wed and Sat or Take as directed by anticoagulation clinic 90 tablet 1   No facility-administered medications prior to visit.    ROS: Review of Systems  Constitutional: Positive for fatigue. Negative for activity change, appetite change, chills and unexpected weight change.  HENT: Negative for congestion, mouth sores and sinus pressure.   Eyes: Negative for visual disturbance.  Respiratory: Negative for cough, chest tightness and shortness of breath.   Cardiovascular: Positive for leg swelling.  Gastrointestinal: Negative for abdominal pain and nausea.  Genitourinary: Negative for difficulty urinating, frequency and vaginal pain.  Musculoskeletal: Positive for arthralgias, back pain and gait problem.  Skin: Positive for rash.  Neurological: Negative for dizziness,  tremors, weakness, numbness and headaches.  Psychiatric/Behavioral: Negative for confusion, sleep disturbance and suicidal ideas.    Objective:  BP (!) 160/94 (BP Location: Left Arm, Patient Position: Sitting, Cuff Size: Large)   Pulse 65   Temp 98 F (36.7 C) (Oral)   Ht 5\' 1"  (1.549 m)   Wt (!) 309 lb (140.2 kg)   LMP  (LMP Unknown)   SpO2 96%   BMI 58.39 kg/m   BP Readings from Last 3 Encounters:  02/18/20 (!) 160/94  09/23/19 (!) 154/68  01/08/19 126/72    Wt Readings from Last 3  Encounters:  02/18/20 (!) 309 lb (140.2 kg)  09/23/19 293 lb (132.9 kg)  01/08/19 290 lb (131.5 kg)    Physical Exam Constitutional:      General: She is not in acute distress.    Appearance: She is well-developed. She is obese.  HENT:     Head: Normocephalic.     Right Ear: External ear normal.     Left Ear: External ear normal.     Nose: Nose normal.  Eyes:     General:        Right eye: No discharge.        Left eye: No discharge.     Conjunctiva/sclera: Conjunctivae normal.     Pupils: Pupils are equal, round, and reactive to light.  Neck:     Thyroid: No thyromegaly.     Vascular: No JVD.     Trachea: No tracheal deviation.  Cardiovascular:     Rate and Rhythm: Normal rate and regular rhythm.     Heart sounds: Normal heart sounds.  Pulmonary:     Effort: No respiratory distress.     Breath sounds: No stridor. No wheezing.  Abdominal:     General: Bowel sounds are normal. There is no distension.     Palpations: Abdomen is soft. There is no mass.     Tenderness: There is no abdominal tenderness. There is no guarding or rebound.  Musculoskeletal:        General: Tenderness present.     Cervical back: Normal range of motion and neck supple.     Right lower leg: Edema present.     Left lower leg: Edema present.  Lymphadenopathy:     Cervical: No cervical adenopathy.  Skin:    Findings: Rash present. No erythema.  Neurological:     Mental Status: She is oriented to person, place, and time.     Cranial Nerves: No cranial nerve deficit.     Motor: No abnormal muscle tone.     Coordination: Coordination normal.     Gait: Gait abnormal.     Deep Tendon Reflexes: Reflexes normal.  Psychiatric:        Behavior: Behavior normal.        Thought Content: Thought content normal.        Judgment: Judgment normal.    Walker Left chest wall H zoster rash Lab Results  Component Value Date   WBC 5.8 08/09/2018   HGB 10.3 (L) 08/09/2018   HCT 32.5 (L) 08/09/2018   PLT  217 08/09/2018   GLUCOSE 95 08/09/2018   ALT 13 08/09/2018   AST 16 08/09/2018   NA 135 08/09/2018   K 3.7 08/09/2018   CL 97 (L) 08/09/2018   CREATININE 2.34 (H) 08/09/2018   BUN 66 (H) 08/09/2018   CO2 27 08/09/2018   TSH 6.26 (H) 01/05/2018   INR 3.5 (A) 02/05/2020   HGBA1C  5.5 01/05/2018    MM DIAG BREAST TOMO BILATERAL  Result Date: 01/31/2019 CLINICAL DATA:  Malignant lumpectomy of the UPPER OUTER QUADRANT of the LEFT breast in May, 2018. Adjuvant radiation therapy.Annual evaluation. EXAM: DIGITAL DIAGNOSTIC BILATERAL MAMMOGRAM WITH CAD AND TOMO COMPARISON:  Previous exam(s). ACR Breast Density Category b: There are scattered areas of fibroglandular density. FINDINGS: Tomosynthesis and synthesized full field CC and MLO views of both breasts and laterally exaggerated CC view of the LEFT breast were obtained. Standard spot magnification MLO view of the lumpectomy site in the LEFT breast was also obtained. Post surgical scar/architectural distortion at the lumpectomy site in the UPPER OUTER LEFT breast at POSTERIOR depth. Residual marked post radiation skin thickening and trabecular thickening throughout the LEFT breast, slightly improved since last year. No findings suspicious for malignancy in the RIGHT breast. Mammographic images were processed with CAD. IMPRESSION: 1. No mammographic evidence of malignancy involving either breast. 2. Expected post lumpectomy changes and post radiation changes involving the LEFT breast. RECOMMENDATION: BILATERAL diagnostic mammography in 1 year. I have discussed the findings and recommendations with the patient. If applicable, a reminder letter will be sent to the patient regarding the next appointment. BI-RADS CATEGORY  2: Benign. Electronically Signed   By: Evangeline Dakin M.D.   On: 01/31/2019 10:23    Assessment & Plan:   There are no diagnoses linked to this encounter.   No orders of the defined types were placed in this  encounter.    Follow-up: No follow-ups on file.  Walker Kehr, MD

## 2020-02-18 NOTE — Assessment & Plan Note (Addendum)
Recurrent - x years Acyclovir prn -dose adjusted for chronic renal failure stage IV

## 2020-02-22 ENCOUNTER — Encounter: Payer: Self-pay | Admitting: Internal Medicine

## 2020-02-22 NOTE — Assessment & Plan Note (Signed)
Acyclovir dose was adjusted

## 2020-02-22 NOTE — Assessment & Plan Note (Signed)
Chronic management with pneumatic compression

## 2020-02-22 NOTE — Assessment & Plan Note (Signed)
On Coumadin 

## 2020-02-25 ENCOUNTER — Ambulatory Visit: Payer: Self-pay | Admitting: General Practice

## 2020-02-25 DIAGNOSIS — I482 Chronic atrial fibrillation, unspecified: Secondary | ICD-10-CM

## 2020-02-25 DIAGNOSIS — Z7901 Long term (current) use of anticoagulants: Secondary | ICD-10-CM

## 2020-02-25 NOTE — Patient Instructions (Signed)
Pre visit review using our clinic review tool, if applicable. No additional management support is needed unless otherwise documented below in the visit note.  Continue to take 1 tablet daily except 1/2 tablet on Tues, Thurs and Sat.  Re-check INR in 2 weeks.  Call Villa Herb, RN with results as well as reporting to [place]. Pt monitors with [employer].  Dosing instructions have been given to patient's son, [name].

## 2020-03-05 ENCOUNTER — Ambulatory Visit (INDEPENDENT_AMBULATORY_CARE_PROVIDER_SITE_OTHER): Payer: Medicare HMO | Admitting: General Practice

## 2020-03-05 DIAGNOSIS — Z7901 Long term (current) use of anticoagulants: Secondary | ICD-10-CM | POA: Diagnosis not present

## 2020-03-05 DIAGNOSIS — I482 Chronic atrial fibrillation, unspecified: Secondary | ICD-10-CM

## 2020-03-05 LAB — POCT INR: INR: 2.7 (ref 2.0–3.0)

## 2020-03-05 NOTE — Patient Instructions (Addendum)
Pre visit review using our clinic review tool, if applicable. No additional management support is needed unless otherwise documented below in the visit note.  Continue to take 1 tablet daily except 1/2 tablet on Tues, Thurs and Sat.  Re-check INR in 2 weeks.  Call Villa Herb, RN with results as well as reporting to [place]. Pt monitors with [employer].  Dosing instructions have been given to patient's son, [name].

## 2020-03-10 DIAGNOSIS — R600 Localized edema: Secondary | ICD-10-CM | POA: Diagnosis not present

## 2020-03-10 DIAGNOSIS — N184 Chronic kidney disease, stage 4 (severe): Secondary | ICD-10-CM | POA: Diagnosis not present

## 2020-03-10 DIAGNOSIS — M109 Gout, unspecified: Secondary | ICD-10-CM | POA: Diagnosis not present

## 2020-03-10 DIAGNOSIS — D649 Anemia, unspecified: Secondary | ICD-10-CM | POA: Diagnosis not present

## 2020-03-10 DIAGNOSIS — I129 Hypertensive chronic kidney disease with stage 1 through stage 4 chronic kidney disease, or unspecified chronic kidney disease: Secondary | ICD-10-CM | POA: Diagnosis not present

## 2020-03-10 DIAGNOSIS — Z6841 Body Mass Index (BMI) 40.0 and over, adult: Secondary | ICD-10-CM | POA: Diagnosis not present

## 2020-03-16 NOTE — Progress Notes (Signed)
Medical screening examination/treatment/procedure(s) were performed by non-physician practitioner and as supervising physician I was immediately available for consultation/collaboration. I agree with above. Devansh Riese, MD  

## 2020-03-17 DIAGNOSIS — Z7901 Long term (current) use of anticoagulants: Secondary | ICD-10-CM | POA: Diagnosis not present

## 2020-03-17 DIAGNOSIS — I4821 Permanent atrial fibrillation: Secondary | ICD-10-CM | POA: Diagnosis not present

## 2020-04-09 ENCOUNTER — Ambulatory Visit (INDEPENDENT_AMBULATORY_CARE_PROVIDER_SITE_OTHER): Payer: Medicare HMO | Admitting: General Practice

## 2020-04-09 DIAGNOSIS — Z7901 Long term (current) use of anticoagulants: Secondary | ICD-10-CM | POA: Diagnosis not present

## 2020-04-09 DIAGNOSIS — I482 Chronic atrial fibrillation, unspecified: Secondary | ICD-10-CM

## 2020-04-09 LAB — POCT INR: INR: 2.8 (ref 2.0–3.0)

## 2020-04-09 NOTE — Patient Instructions (Signed)
Pre visit review using our clinic review tool, if applicable. No additional management support is needed unless otherwise documented below in the visit note.  Continue to take 1 tablet daily except 1/2 tablet on Tues, Thurs and Sat.  Re-check INR in 2 weeks.  Call Villa Herb, RN with results as well as reporting to [place]. Pt monitors with [employer].  Dosing instructions have been given to patient's son, [name].

## 2020-04-20 ENCOUNTER — Ambulatory Visit: Payer: Medicare HMO | Admitting: Hematology

## 2020-04-20 ENCOUNTER — Other Ambulatory Visit: Payer: Medicare HMO

## 2020-04-30 ENCOUNTER — Ambulatory Visit (INDEPENDENT_AMBULATORY_CARE_PROVIDER_SITE_OTHER): Payer: Medicare HMO | Admitting: General Practice

## 2020-04-30 DIAGNOSIS — I482 Chronic atrial fibrillation, unspecified: Secondary | ICD-10-CM | POA: Diagnosis not present

## 2020-04-30 DIAGNOSIS — Z7901 Long term (current) use of anticoagulants: Secondary | ICD-10-CM

## 2020-04-30 LAB — POCT INR: INR: 2 (ref 2.0–3.0)

## 2020-04-30 NOTE — Patient Instructions (Signed)
Pre visit review using our clinic review tool, if applicable. No additional management support is needed unless otherwise documented below in the visit note.  Continue to take 1 tablet daily except 1/2 tablet on Tues, Thurs and Sat.  Re-check INR in 2 weeks.  Call Villa Herb, RN with results as well as reporting to [place]. Pt monitors with [employer].  Dosing instructions have been given to patient's son, [name].

## 2020-04-30 NOTE — Progress Notes (Signed)
Medical screening examination/treatment/procedure(s) were performed by non-physician practitioner and as supervising physician I was immediately available for consultation/collaboration. I agree with above. Heyli Min, MD   

## 2020-05-06 ENCOUNTER — Other Ambulatory Visit: Payer: Self-pay | Admitting: Internal Medicine

## 2020-05-06 NOTE — Telephone Encounter (Signed)
[place] Controlled Database Checked Last filled:  Tramadol 03/17/20 # 60 LOV w/you:  02/18/20  Next appt w/you:  None

## 2020-05-19 DIAGNOSIS — M170 Bilateral primary osteoarthritis of knee: Secondary | ICD-10-CM | POA: Diagnosis not present

## 2020-05-21 ENCOUNTER — Other Ambulatory Visit: Payer: Self-pay

## 2020-05-21 ENCOUNTER — Ambulatory Visit
Admission: RE | Admit: 2020-05-21 | Discharge: 2020-05-21 | Disposition: A | Discharge status: Home / Self Care | Payer: Medicare HMO | Source: Ambulatory Visit | Attending: Nurse Practitioner | Admitting: Nurse Practitioner

## 2020-05-21 ENCOUNTER — Other Ambulatory Visit: Payer: Medicare HMO

## 2020-05-21 DIAGNOSIS — R928 Other abnormal and inconclusive findings on diagnostic imaging of breast: Secondary | ICD-10-CM | POA: Diagnosis not present

## 2020-05-21 DIAGNOSIS — Z170 Estrogen receptor positive status [ER+]: Secondary | ICD-10-CM

## 2020-05-21 DIAGNOSIS — Z853 Personal history of malignant neoplasm of breast: Secondary | ICD-10-CM | POA: Diagnosis not present

## 2020-05-21 DIAGNOSIS — C50412 Malignant neoplasm of upper-outer quadrant of left female breast: Secondary | ICD-10-CM

## 2020-05-26 DIAGNOSIS — M170 Bilateral primary osteoarthritis of knee: Secondary | ICD-10-CM | POA: Diagnosis not present

## 2020-06-02 ENCOUNTER — Ambulatory Visit (INDEPENDENT_AMBULATORY_CARE_PROVIDER_SITE_OTHER): Payer: Medicare HMO | Admitting: General Practice

## 2020-06-02 DIAGNOSIS — Z7901 Long term (current) use of anticoagulants: Secondary | ICD-10-CM | POA: Diagnosis not present

## 2020-06-02 DIAGNOSIS — M170 Bilateral primary osteoarthritis of knee: Secondary | ICD-10-CM | POA: Diagnosis not present

## 2020-06-02 DIAGNOSIS — I482 Chronic atrial fibrillation, unspecified: Secondary | ICD-10-CM

## 2020-06-02 LAB — POCT INR: INR: 3.2 — AB (ref 2.0–3.0)

## 2020-06-02 NOTE — Patient Instructions (Signed)
Pre visit review using our clinic review tool, if applicable. No additional management support is needed unless otherwise documented below in the visit note.  Skip coumadin dosage today and then continue to take 1 tablet daily except 1/2 tablet on Tues, Thurs and Sat.  Re-check INR in 2 weeks.  Call Villa Herb, RN with results as well as reporting to [place]. Pt monitors with [employer].  Dosing instructions have been given to patient's son, [name].

## 2020-06-03 NOTE — Progress Notes (Signed)
[place]   [phone] [phone]   Clinic Follow up Note   Patient Care Team: Plotnikov, [name], MD as PCP - General (Internal Medicine) [name], MD as Consulting Physician (General Surgery) Truitt Merle, MD as Consulting Physician (Hematology) Kyung Rudd, MD as Consulting Physician (Radiation Oncology) Estanislado Emms, MD as Consulting Physician (Nephrology) Lorretta Harp, MD as Consulting Physician (Cardiology) Verner Chol, MD as Consulting Physician (Sports Medicine) Delice Bison, Charlestine Massed, NP as Nurse Practitioner (Hematology and Oncology)  Date of Service:  06/10/2020  CHIEF COMPLAINT: Follow up Left breast cancer  SUMMARY OF ONCOLOGIC HISTORY: Oncology History Overview Note  Cancer Staging Malignant neoplasm of upper-outer quadrant of left breast in female, estrogen receptor positive ([place]) Staging form: Breast, AJCC 8th Edition - Clinical stage from 02/06/2017: Stage IA (cT1b(m), cN0, cM0, G1, ER: Positive, PR: Positive, HER2: Negative) - Signed by Truitt Merle, MD on 02/14/2017 - Pathologic stage from 04/04/2017: Stage IA (pT1b(m), pN0, cM0, G2, ER: Positive, PR: Positive, HER2: Negative, Oncotype DX score: 18) - Signed by Truitt Merle, MD on 06/30/2017     Malignant neoplasm of upper-outer quadrant of left breast in female, estrogen receptor positive ([place])  01/26/2017 Mammogram   Screening mammogram on 01/26/17 showed possible asymmetries in the left breast.    02/02/2017 Mammogram   Diagnostic mammogram and [place]: 1. An irregular hypoechoic mass in the left breast at 1 o'clock 7 cm from the nipple measuring 6 x 3 x 4 mm.  2. There is an irregular hypoechoic mass in left breast at 3 o'clock 7 cm from the nipple measuring 4 x 4 x 3 mm. 3. There is a subtle hypoechoic mass in the left breast at 2:30 5 cm from the nipple measuring 3 x 3 x 3 mm 4. There are multiple other small cysts in the left breast  5. Left axilla (-) on [place]     02/06/2017 Initial Biopsy   Diagnosis 1. Breast, left, needle core biopsy, 1:00 o'clock, 7 CMFN - INVASIVE DUCTAL CARCINOMA, SEE COMMENT. - DUCTAL CARCINOMA IN SITU. 2. Breast, left, needle core biopsy, 3:00 o'clock, 7 CMFN - INVASIVE DUCTAL CARCINOMA, SEE COMMENT. - DUCTAL CARCINOMA IN SITU. Microscopic Comment 1. The carcinoma in parts #1 and 2 appear morphologically similar and grade 1.   02/06/2017 Receptors her2   Both biopsy ER 100%+, PR 100%+, HER2-, Ki67 10%   02/06/2017 Initial Diagnosis   Malignant neoplasm of upper-outer quadrant of left breast in female, estrogen receptor positive ([place])   04/04/2017 Surgery   Left breast lumpectomy and SLN biopsy    04/04/2017 Pathology Results   Diagnosis 1. Breast, lumpectomy, Left - MULTIFOCAL INVASIVE DUCTAL CARCINOMA, [place]HAM GRADE 2 OF 3, 0.9 CM - DUCTAL CARCINOMA IN SITU, LOW GRADE (<1MM FROM MEDIAL MARGIN) - FIBROCYSTIC AND COLUMNAR CELL CHANGE - CALCIFICATIONS ASSOCIATED WITH CARCINOMA AND BENIGN DISEASE - MARGINS UNINVOLVED BY CARCINOMA - PREVIOUS BIOPSY SITE CHANGES - SEE ONCOLOGY TABLE BELOW 2. Lymph node, sentinel, biopsy, Left Axillary #1 - NO CARCINOMA IDENTIFIED IN ONE LYMPH NODE (0/1) 3. Lymph node, sentinel, biopsy, Left Axillary #2 - NO CARCINOMA IDENTIFIED IN ONE LYMPH NODE (0/1)   04/04/2017 Oncotype testing   RS 18, which predicts 10-year distant recurrence risk of 12% with tamoxifen    04/26/2017 Surgery   Re-excision for close margin, final pathyology was negative for cancer    06/06/2017 - 07/19/2017 Radiation Therapy   The Left breast was treated to 42.5 Gy in 17 fractions  at 2.5 Gy per fraction. 2. The Left breast was boosted to 7.5 Gy in 3 fractions at 2.5 Gy per fraction.      06/18/2017 - 06/23/2017 Hospital Admission   Admit date: 06/18/17 Admission diagnosis: Intractable lower back pain- sciatica  Additional comments: Sent home with course of steroids and referral for f/u for back injections. Did  have AKI w/ CR at 2/0, up from her baseline of 1.6. This slowed with IVF. Hypokalemia improved with K replacement. Radiotherapy was stopped at that time as she was unable to lay on her back due to pain.    08/2017 -  Anti-estrogen oral therapy   Exemestane 25 mg dialy starting 08/2017     08/28/2017 Imaging   BONE DENSITY SCAN  ASSESSMENT: The BMD measured at Femur Neck Left is 0.848 g/cm2 with a T-score of -1.4.   DualFemur Neck Left 08/28/2017    71.7         -1.4    0.848 g/cm2 AP Spine  L1-L3     08/28/2017    71.7         -1.1    1.052 g/cm2   01/29/2018 Mammogram   IMPRESSION: 1. No mammographic evidence of breast malignancy. 2. Surgical and radiation changes within the LEFT breast.      CURRENT THERAPY:  Exemestane 25 mg daily starting 08/2017   INTERVAL HISTORY:  Katelyn Jackson is here for a follow up of left breast cancer. She was last seen by me in 08/2018 and has been seen by NP Lacie 8 months ago. She presents to the clinic with her [place]. She notes her main current issues LE edema with knee pain. She notes she is getting knee injections every 6 months. She can barely walk around. Her LE edema was worse in the past 2 years, but now improved. She notes this LE edema is lymphedema. She was given machine to massage her legs BID. She last saw her Cardiologist before her breast surgery.  She notes her coumadin level is checked by her PCP. She notes she is tolerating exemestane well. Her joint pain stable on this. She notes her kidney doctor instructed her to increase her oral iron to TID.    REVIEW OF SYSTEMS:   Constitutional: Denies fevers, chills or abnormal weight loss Eyes: Denies blurriness of vision Ears, nose, mouth, throat, and face: Denies mucositis or sore throat Respiratory: Denies cough, dyspnea or wheezes Cardiovascular: Denies palpitation, chest discomfort (+) lower extremity swelling, L>R Gastrointestinal:  Denies nausea, heartburn or change  in bowel habits Skin: Denies abnormal skin rashes MSK: (+) Arthritis, mainly in knees  Lymphatics: Denies new lymphadenopathy or easy bruising Neurological:Denies numbness, tingling or new weaknesses Behavioral/Psych: Mood is stable, no new changes  All other systems were reviewed with the patient and are negative.  MEDICAL HISTORY:  Past Medical History:  Diagnosis Date  . Arthritis   . Cancer Associated [facility])    breast cancer  . Chronic atrial fibrillation ([place])   . Chronic kidney disease    sees Dr Florene Glen  . Cramps, muscle, general   . Dyspnea   . Dyspnea on exertion   . Dysrhythmia   . GERD (gastroesophageal reflux disease)   . Headache   . Hyperlipidemia   . Hypertension   . Hypothyroidism   . Lymphedema   . Moderate to severe pulmonary hypertension ([place])   . Obesity   . Personal history of radiation therapy     SURGICAL HISTORY: Past Surgical History:  Procedure Laterality Date  . APPENDECTOMY    . BREAST BIOPSY Left 2018  . BREAST LUMPECTOMY Left 04/04/2017   x3  . BREAST LUMPECTOMY WITH NEEDLE LOCALIZATION AND AXILLARY SENTINEL LYMPH NODE BX Left 04/04/2017   Procedure: BREAST LUMPECTOMY WITH NEEDLE LOCALIZATION x3 AND AXILLARY SENTINEL LYMPH NODE BX;  Surgeon: [name], MD;  Location: [facility];  Service: General;  Laterality: Left;  . CATARACT EXTRACTION W/ INTRAOCULAR LENS  IMPLANT, BILATERAL Bilateral 2018  . COLONOSCOPY    . EYE SURGERY    . GLAUCOMA SURGERY Bilateral 2018  . RE-EXCISION OF BREAST CANCER,SUPERIOR MARGINS Left 04/26/2017   Procedure: RE-EXCISION OF LEFT BREAST CANCER;  Surgeon: [name], MD;  Location: [facility];  Service: General;  Laterality: Left;    I have reviewed the social history and family history with the patient and they are unchanged from previous note.  ALLERGIES:  is allergic to chlorhexidine gluconate and penicillins.  MEDICATIONS:  Current Outpatient Medications  Medication Sig Dispense Refill  . allopurinol (ZYLOPRIM) 100 MG  tablet Take 0.5 tablets (50 mg total) by mouth daily. 45 tablet 0  . Calcium-Magnesium-Vitamin D (CALCIUM 500 PO) Take 1 tablet by mouth daily.    . carvedilol (COREG) 6.25 MG tablet Take 1 tablet (6.25 mg total) by mouth 2 (two) times daily with a meal. Overdue for Annual appt must see provider for future refills 180 tablet 3  . Cholecalciferol (VITAMIN D3) 2000 units capsule Take 1 capsule (2,000 Units total) by mouth daily. 100 capsule 3  . clindamycin (CLEOCIN) 300 MG capsule Take 1 capsule (300 mg total) by mouth 3 (three) times daily. 21 capsule 0  . Cyanocobalamin (VITAMIN B-12 PO) Take 1 tablet by mouth daily.    . diclofenac Sodium (VOLTAREN) 1 % GEL APPLY 4 GRAMS FOUR TIMES DAILY AS NEEDED 100 g 5  . exemestane (AROMASIN) 25 MG tablet Take 1 tablet (25 mg total) by mouth daily after breakfast. 90 tablet 3  . ferrous sulfate 325 (65 FE) MG tablet Take 325 mg by mouth daily with breakfast.    . furosemide (LASIX) 40 MG tablet TAKE ONE (1) TABLET BY MOUTH EVERY DAY AS NEEDED 90 tablet 3  . irbesartan-hydrochlorothiazide (AVALIDE) 150-12.5 MG tablet Take 1 tablet by mouth daily. 90 tablet 3  . levothyroxine (SYNTHROID) 50 MCG tablet Take 1 tablet (50 mcg total) by mouth daily before breakfast. Overdue for Annual appt must see provider for future refills 90 tablet 3  . lidocaine (LIDODERM) 5 % Place 1-2 patches onto the skin daily. Remove & Discard patch within 12 hours or as directed by MD 60 patch 5  . lipase/protease/amylase (CREON) 12000 units CPEP capsule Take 1 capsule (12,000 Units total) by mouth daily as needed (stomach problems). 90 capsule 0  . lovastatin (MEVACOR) 20 MG tablet Take 1 tablet (20 mg total) by mouth daily. 90 tablet 3  . methocarbamol (ROBAXIN) 500 MG tablet Take 1 tablet (500 mg total) by mouth 4 (four) times daily. 120 tablet 5  . omeprazole (PRILOSEC) 40 MG capsule Take 1 capsule (40 mg total) by mouth daily. 90 capsule 3  . polyethylene glycol powder  (GLYCOLAX/MIRALAX) powder mix 1 capful (17 grams) IN 8 ounces OF liquid EVERY DAY 1581 g 3  . potassium chloride (KLOR-CON) 8 MEQ tablet Take 1 tablet (8 mEq total) by mouth daily. 90 tablet 3  . traMADol (ULTRAM) 50 MG tablet TAKE ONE TABLET BY MOUTH EVERY 12 HOURS 60 tablet 5  . VOLTAREN  1 % GEL apply 4 grams 4 TIMES DAILY AS NEEDED FOR 30 DAYS 100 g 5  . warfarin (COUMADIN) 5 MG tablet Take 1 tablet daily except take 1/2 tablet on Wed and Sat or Take as directed by anticoagulation clinic 90 tablet 1   No current facility-administered medications for this visit.    PHYSICAL EXAMINATION: ECOG PERFORMANCE STATUS: 3 - Symptomatic, >50% confined to bed  Vitals:   06/10/20 0947  BP: 128/72  Pulse: 83  Resp: 20  Temp: 98.1 F (36.7 C)  SpO2: 100%   Filed Weights   06/10/20 0947  Weight: 281 lb 8 oz (127.7 kg)    GENERAL:alert, no distress and comfortable SKIN: skin color, texture, turgor are normal, no rashes or significant lesions EYES: normal, Conjunctiva are pink and non-injected, sclera clear  NECK: supple, thyroid normal size, non-tender, without nodularity LYMPH:  no palpable lymphadenopathy in the cervical, axillary  LUNGS: clear to auscultation and percussion with normal breathing effort HEART: regular rate & rhythm and no murmurs  (+) Significant lower extremity edema, L>R with skin erythema of left ankle  ABDOMEN:abdomen soft, non-tender and normal bowel sounds Musculoskeletal:no cyanosis of digits and no clubbing  NEURO: alert & oriented x 3 with fluent speech, no focal motor/sensory deficits Exam performed in wheelchair today - Breast Exam deferred today   LABORATORY DATA:  I have reviewed the data as listed CBC Latest Ref Rng & Units 06/10/2020 08/09/2018 02/07/2018  WBC 4.0 - 10.5 K/uL 5.9 5.8 5.5  Hemoglobin 12.0 - 15.0 g/dL 9.5(L) 10.3(L) 11.1(L)  Hematocrit 36 - 46 % 30.2(L) 32.5(L) 36.1  Platelets 150 - 400 K/uL 281 217 204     CMP Latest Ref Rng & Units  06/10/2020 08/09/2018 02/07/2018  Glucose 70 - 99 mg/dL 108(H) 95 87  BUN 8 - 23 mg/dL 65(H) 66(H) 51(H)  Creatinine 0.44 - 1.00 mg/dL 2.09(H) 2.34(H) 2.00(H)  Sodium 135 - 145 mmol/L 137 135 137  Potassium 3.5 - 5.1 mmol/L 3.9 3.7 4.0  Chloride 98 - 111 mmol/L 104 97(L) 103  CO2 22 - 32 mmol/L 20(L) 27 25  Calcium 8.9 - 10.3 mg/dL 10.5(H) 10.2 9.9  Total Protein 6.5 - 8.1 g/dL 8.2(H) 7.6 7.4  Total Bilirubin 0.3 - 1.2 mg/dL 1.0 0.7 0.7  Alkaline Phos 38 - 126 U/L 230(H) 146(H) 173(H)  AST 15 - 41 U/L _0 ALT 0 - 44 U/L 33 13 36      RADIOGRAPHIC STUDIES: I have personally reviewed the radiological images as listed and agreed with the findings in the report. No results found.   ASSESSMENT & PLAN:  Shaquinta Peruski is a 74 y.o. female with    1. Malignant neoplasm of upper-outer quadrant of left breast, invasive ductal carcinoma, stage IA (pT1b(m)N0), grade 2, ER+,PR+,HER2-, oncotype RS 18 -She was diagnosed in 02/2017. She is S/p left lumpectomy on 04/04/2017 and re-excision of close margin on 6/202/018, she has had complete surgical resection, surgical margins were negative. Lymph nodes were negative. -Given her relatively low score in the intermedia group, I did not recommend adjuvant chemotherapy. Patient agreed -S/p adjuvant radiation 06/06/2017 to 07/19/2017 with Dr. Lisbeth Renshaw and tolerated well.  -She started Exemestane in 08/2017, tolerating well. Continue for at least 5 years -From a breast cancer standpoint, she is doing well. Labs reviewed, CBC and CMP WNL except Hg 9.5, BG 108, Cr 2.09, Ca 10.5, protein 8.2, albumin 3.4, Alk Phos 230. Her 05/2020 mammogram was normal. There is no concern  for cancer recurrence.  -Continue breast cancer surveillance, next mammogram in 05/2021.  -Continue exemestane  -F/u in 1 year. I encouraged her to watch for symptoms concerning for breast cancer recurrence.    2. Chronic atrial fibrillation, hyperlipidemia, HTN, lymphedema, pulmonary  hypertension, obesity, hypothyroidism -Managed by her PCP and Cardiologist (Dr. Gwenlyn Found) -On Lasix, Lovastatin, Diovan-HCT, Coumadin 5 mg, and synthroid.   3. Anemia of chronic disease (CKD) and iron deficiency - iron level in December 2018, ferritin was 33, relatively low given her CKD.  -She is on oral ferrous sulfate, now TID -Hemoglobin decreased to 9.5 today (06/10/20). Iron panel is still pending.  -CKD is stable. She follows up with nephrologist every 6 months    4. Osteoarthritis, arthralgia -followed by ortho at [facility] -Pain is stable on AI -Her pain arthritis is in her knees. She receives knee injections q54month with ortho  5. Osteopenia  -08/2017 DEXA showed osteopenia with a T-Score of -1.4 at the left femur neck -she takes calcium and vitamin D -will repeat DEXA at breast center on 08/18/20  -Her Ca is elevated at 10.5 today (06/10/20).    6. Bilateral LE lymphedema, probable LLE cellulitis  -She notes in the past 2 years her LE edema significantly worsened. This has peaked in recent year and improved lately. She attributes this to lymphedema. She uses machine to massage her legs BID.  -She notes in the past week her Left ankle has been more red. She denies fever.  -She notes due to knee pain and heaviness of her legs, she does not walk much.  -On exam today (06/10/20) she has significant edema of legs, L>R with skin erythema of left ankle. I have concern for cellulitis or infection.  -I discussed etiology of lymphedema could be venous stasis, but not with this degree of swelling. I also discussed this could be indicating cardiac issues.  -I will call in antibiotics Clindamycin (06/10/20). She should take this with yogurt.  -I recommend she elevate her legs and use compression socks. I also recommend she f/u with her PCP for work up and management. It would be reasonable to obtain a CT abd/pel to rule out adenopathy as a cause, she can not get IV contrast due to her CKD  which will limit the quality of CT     PLAN: -I called in Clindamycin today  -Copy note to her PCP Dr PLanice Schwab-Continue Exemestane, I refilled today  -DEXA on 08/18/20 scheduled  -Mammogram in 05/2021 -Lab and F/u in 1 year    No problem-specific Assessment & Plan notes found for this encounter.   Orders Placed This Encounter  Procedures  . MM DIAG BREAST TOMO BILATERAL    Standing Status:   Future    Standing Expiration Date:   06/10/2021    Order Specific Question:   Reason for Exam (SYMPTOM  OR DIAGNOSIS REQUIRED)    Answer:   screening    Order Specific Question:   [facility]    [facility]  All questions were answered. The patient knows to call the clinic with any problems, questions or concerns. No barriers to learning was detected. The total time spent in the appointment was 30 minutes.     YTruitt Merle MD 06/10/2020   I, AJoslyn Devon am acting as scribe for YTruitt Merle MD.   I have reviewed the above documentation for accuracy and completeness, and I agree with the above.

## 2020-06-10 ENCOUNTER — Telehealth: Payer: Self-pay | Admitting: Hematology

## 2020-06-10 ENCOUNTER — Other Ambulatory Visit: Payer: Self-pay

## 2020-06-10 ENCOUNTER — Inpatient Hospital Stay: Payer: Medicare HMO | Attending: Hematology

## 2020-06-10 ENCOUNTER — Encounter: Payer: Self-pay | Admitting: Hematology

## 2020-06-10 ENCOUNTER — Inpatient Hospital Stay (HOSPITAL_BASED_OUTPATIENT_CLINIC_OR_DEPARTMENT_OTHER): Payer: Medicare HMO | Admitting: Hematology

## 2020-06-10 VITALS — BP 128/72 | HR 83 | Temp 98.1°F | Resp 20 | Ht 61.0 in | Wt 281.5 lb

## 2020-06-10 DIAGNOSIS — E611 Iron deficiency: Secondary | ICD-10-CM | POA: Diagnosis not present

## 2020-06-10 DIAGNOSIS — C50412 Malignant neoplasm of upper-outer quadrant of left female breast: Secondary | ICD-10-CM

## 2020-06-10 DIAGNOSIS — D631 Anemia in chronic kidney disease: Secondary | ICD-10-CM | POA: Diagnosis not present

## 2020-06-10 DIAGNOSIS — Z79899 Other long term (current) drug therapy: Secondary | ICD-10-CM | POA: Diagnosis not present

## 2020-06-10 DIAGNOSIS — I129 Hypertensive chronic kidney disease with stage 1 through stage 4 chronic kidney disease, or unspecified chronic kidney disease: Secondary | ICD-10-CM | POA: Insufficient documentation

## 2020-06-10 DIAGNOSIS — N189 Chronic kidney disease, unspecified: Secondary | ICD-10-CM | POA: Diagnosis not present

## 2020-06-10 DIAGNOSIS — Z880 Allergy status to penicillin: Secondary | ICD-10-CM | POA: Diagnosis not present

## 2020-06-10 DIAGNOSIS — Z79811 Long term (current) use of aromatase inhibitors: Secondary | ICD-10-CM | POA: Insufficient documentation

## 2020-06-10 DIAGNOSIS — D638 Anemia in other chronic diseases classified elsewhere: Secondary | ICD-10-CM

## 2020-06-10 DIAGNOSIS — M1990 Unspecified osteoarthritis, unspecified site: Secondary | ICD-10-CM | POA: Diagnosis not present

## 2020-06-10 DIAGNOSIS — Z7901 Long term (current) use of anticoagulants: Secondary | ICD-10-CM | POA: Diagnosis not present

## 2020-06-10 DIAGNOSIS — Z9049 Acquired absence of other specified parts of digestive tract: Secondary | ICD-10-CM | POA: Insufficient documentation

## 2020-06-10 DIAGNOSIS — Z170 Estrogen receptor positive status [ER+]: Secondary | ICD-10-CM | POA: Insufficient documentation

## 2020-06-10 DIAGNOSIS — I272 Pulmonary hypertension, unspecified: Secondary | ICD-10-CM | POA: Insufficient documentation

## 2020-06-10 DIAGNOSIS — E669 Obesity, unspecified: Secondary | ICD-10-CM | POA: Insufficient documentation

## 2020-06-10 DIAGNOSIS — E039 Hypothyroidism, unspecified: Secondary | ICD-10-CM | POA: Insufficient documentation

## 2020-06-10 DIAGNOSIS — I482 Chronic atrial fibrillation, unspecified: Secondary | ICD-10-CM | POA: Diagnosis not present

## 2020-06-10 DIAGNOSIS — E785 Hyperlipidemia, unspecified: Secondary | ICD-10-CM | POA: Insufficient documentation

## 2020-06-10 DIAGNOSIS — M545 Low back pain: Secondary | ICD-10-CM | POA: Diagnosis not present

## 2020-06-10 DIAGNOSIS — R609 Edema, unspecified: Secondary | ICD-10-CM | POA: Diagnosis not present

## 2020-06-10 DIAGNOSIS — I890 Lymphedema, not elsewhere classified: Secondary | ICD-10-CM | POA: Diagnosis not present

## 2020-06-10 DIAGNOSIS — E876 Hypokalemia: Secondary | ICD-10-CM | POA: Insufficient documentation

## 2020-06-10 DIAGNOSIS — M25569 Pain in unspecified knee: Secondary | ICD-10-CM | POA: Insufficient documentation

## 2020-06-10 DIAGNOSIS — Z923 Personal history of irradiation: Secondary | ICD-10-CM | POA: Insufficient documentation

## 2020-06-10 DIAGNOSIS — M85852 Other specified disorders of bone density and structure, left thigh: Secondary | ICD-10-CM | POA: Diagnosis not present

## 2020-06-10 LAB — CBC WITH DIFFERENTIAL (CANCER CENTER ONLY)
Abs Immature Granulocytes: 0.03 10*3/uL (ref 0.00–0.07)
Basophils Absolute: 0 10*3/uL (ref 0.0–0.1)
Basophils Relative: 1 %
Eosinophils Absolute: 0.1 10*3/uL (ref 0.0–0.5)
Eosinophils Relative: 2 %
HCT: 30.2 % — ABNORMAL LOW (ref 36.0–46.0)
Hemoglobin: 9.5 g/dL — ABNORMAL LOW (ref 12.0–15.0)
Immature Granulocytes: 1 %
Lymphocytes Relative: 19 %
Lymphs Abs: 1.2 10*3/uL (ref 0.7–4.0)
MCH: 29.8 pg (ref 26.0–34.0)
MCHC: 31.5 g/dL (ref 30.0–36.0)
MCV: 94.7 fL (ref 80.0–100.0)
Monocytes Absolute: 0.7 10*3/uL (ref 0.1–1.0)
Monocytes Relative: 12 %
Neutro Abs: 3.9 10*3/uL (ref 1.7–7.7)
Neutrophils Relative %: 65 %
Platelet Count: 281 10*3/uL (ref 150–400)
RBC: 3.19 MIL/uL — ABNORMAL LOW (ref 3.87–5.11)
RDW: 14.4 % (ref 11.5–15.5)
WBC Count: 5.9 10*3/uL (ref 4.0–10.5)
nRBC: 0 % (ref 0.0–0.2)

## 2020-06-10 LAB — IRON AND TIBC
Iron: 58 ug/dL (ref 41–142)
Saturation Ratios: 18 % — ABNORMAL LOW (ref 21–57)
TIBC: 326 ug/dL (ref 236–444)
UIBC: 268 ug/dL (ref 120–384)

## 2020-06-10 LAB — COMPREHENSIVE METABOLIC PANEL
ALT: 33 U/L (ref 0–44)
AST: 24 U/L (ref 15–41)
Albumin: 3.4 g/dL — ABNORMAL LOW (ref 3.5–5.0)
Alkaline Phosphatase: 230 U/L — ABNORMAL HIGH (ref 38–126)
Anion gap: 13 (ref 5–15)
BUN: 65 mg/dL — ABNORMAL HIGH (ref 8–23)
CO2: 20 mmol/L — ABNORMAL LOW (ref 22–32)
Calcium: 10.5 mg/dL — ABNORMAL HIGH (ref 8.9–10.3)
Chloride: 104 mmol/L (ref 98–111)
Creatinine, Ser: 2.09 mg/dL — ABNORMAL HIGH (ref 0.44–1.00)
GFR calc Af Amer: 26 mL/min — ABNORMAL LOW (ref >60)
GFR calc non Af Amer: 23 mL/min — ABNORMAL LOW (ref >60)
Glucose, Bld: 108 mg/dL — ABNORMAL HIGH (ref 70–99)
Potassium: 3.9 mmol/L (ref 3.5–5.1)
Sodium: 137 mmol/L (ref 135–145)
Total Bilirubin: 1 mg/dL (ref 0.3–1.2)
Total Protein: 8.2 g/dL — ABNORMAL HIGH (ref 6.5–8.1)

## 2020-06-10 LAB — RETICULOCYTES
Immature Retic Fract: 12.3 % (ref 2.3–15.9)
RBC.: 3.22 MIL/uL — ABNORMAL LOW (ref 3.87–5.11)
Retic Count, Absolute: 48.3 10*3/uL (ref 19.0–186.0)
Retic Ct Pct: 1.5 % (ref 0.4–3.1)

## 2020-06-10 LAB — FERRITIN: Ferritin: 273 ng/mL (ref 11–307)

## 2020-06-10 MED ORDER — CLINDAMYCIN HCL 300 MG PO CAPS
300.0000 mg | ORAL_CAPSULE | Freq: Three times a day (TID) | ORAL | 0 refills | Status: DC
Start: 2020-06-10 — End: 2021-02-25

## 2020-06-10 MED ORDER — EXEMESTANE 25 MG PO TABS: 25.0000 mg | ORAL_TABLET | Freq: Every day | ORAL | 3 refills | Status: DC

## 2020-06-10 NOTE — Telephone Encounter (Signed)
Scheduled per los. Gave avs and calendar  

## 2020-06-16 ENCOUNTER — Encounter: Payer: Self-pay | Admitting: Internal Medicine

## 2020-06-16 ENCOUNTER — Ambulatory Visit (INDEPENDENT_AMBULATORY_CARE_PROVIDER_SITE_OTHER): Payer: Medicare HMO | Admitting: Internal Medicine

## 2020-06-16 ENCOUNTER — Other Ambulatory Visit: Payer: Self-pay

## 2020-06-16 DIAGNOSIS — I890 Lymphedema, not elsewhere classified: Secondary | ICD-10-CM

## 2020-06-16 DIAGNOSIS — L02512 Cutaneous abscess of left hand: Secondary | ICD-10-CM

## 2020-06-16 DIAGNOSIS — G8929 Other chronic pain: Secondary | ICD-10-CM | POA: Diagnosis not present

## 2020-06-16 DIAGNOSIS — L03116 Cellulitis of left lower limb: Secondary | ICD-10-CM

## 2020-06-16 DIAGNOSIS — M5441 Lumbago with sciatica, right side: Secondary | ICD-10-CM | POA: Diagnosis not present

## 2020-06-16 DIAGNOSIS — M5442 Lumbago with sciatica, left side: Secondary | ICD-10-CM

## 2020-06-16 DIAGNOSIS — L02416 Cutaneous abscess of left lower limb: Secondary | ICD-10-CM | POA: Diagnosis not present

## 2020-06-16 MED ORDER — TRIAMCINOLONE ACETONIDE 0.1 % EX OINT
1.0000 | TOPICAL_OINTMENT | Freq: Two times a day (BID) | CUTANEOUS | 3 refills | Status: DC
Start: 2020-06-16 — End: 2020-06-16

## 2020-06-16 MED ORDER — TRIAMCINOLONE ACETONIDE 0.1 % EX OINT
1.0000 | TOPICAL_OINTMENT | Freq: Two times a day (BID) | CUTANEOUS | 2 refills | Status: DC
Start: 2020-06-16 — End: 2021-02-25

## 2020-06-16 NOTE — Patient Instructions (Signed)
EPSOM salt

## 2020-06-16 NOTE — Progress Notes (Signed)
Subjective:  Patient ID: Katelyn Jackson, female    DOB: July 12, 1946  Age: 74 y.o. MRN: [id number]  CC: Leg Swelling ((L) Son states she saw her oncologist and was advise to f/u w/PCP. ? Infection in (L) leg)   HPI Katelyn Jackson presents for LLE swelling and redness - 2 wks, on [place] x 1 week The patient is complaining of her left thumb a recurrent abscess at the age of the nail.  Outpatient Medications Prior to Visit  Medication Sig Dispense Refill  . allopurinol (ZYLOPRIM) 100 MG tablet Take 0.5 tablets (50 mg total) by mouth daily. 45 tablet 0  . Calcium-Magnesium-Vitamin D (CALCIUM 500 PO) Take 1 tablet by mouth daily.    . carvedilol (COREG) 6.25 MG tablet Take 1 tablet (6.25 mg total) by mouth 2 (two) times daily with a meal. Overdue for Annual appt must see provider for future refills 180 tablet 3  . Cholecalciferol (VITAMIN D3) 2000 units capsule Take 1 capsule (2,000 Units total) by mouth daily. 100 capsule 3  . clindamycin (CLEOCIN) 300 MG capsule Take 1 capsule (300 mg total) by mouth 3 (three) times daily. 21 capsule 0  . Cyanocobalamin (VITAMIN B-12 PO) Take 1 tablet by mouth daily.    . diclofenac Sodium (VOLTAREN) 1 % GEL APPLY 4 GRAMS FOUR TIMES DAILY AS NEEDED 100 g 5  . exemestane (AROMASIN) 25 MG tablet Take 1 tablet (25 mg total) by mouth daily after breakfast. 90 tablet 3  . ferrous sulfate 325 (65 FE) MG tablet Take 325 mg by mouth daily with breakfast.    . furosemide (LASIX) 40 MG tablet TAKE ONE (1) TABLET BY MOUTH EVERY DAY AS NEEDED 90 tablet 3  . irbesartan-hydrochlorothiazide (AVALIDE) 150-12.5 MG tablet Take 1 tablet by mouth daily. 90 tablet 3  . levothyroxine (SYNTHROID) 50 MCG tablet Take 1 tablet (50 mcg total) by mouth daily before breakfast. Overdue for Annual appt must see provider for future refills 90 tablet 3  . lidocaine (LIDODERM) 5 % Place 1-2 patches onto the skin daily. Remove & Discard patch within 12 hours or as directed by MD 60 patch 5   . lipase/protease/amylase (CREON) 12000 units CPEP capsule Take 1 capsule (12,000 Units total) by mouth daily as needed (stomach problems). 90 capsule 0  . lovastatin (MEVACOR) 20 MG tablet Take 1 tablet (20 mg total) by mouth daily. 90 tablet 3  . methocarbamol (ROBAXIN) 500 MG tablet Take 1 tablet (500 mg total) by mouth 4 (four) times daily. 120 tablet 5  . omeprazole (PRILOSEC) 40 MG capsule Take 1 capsule (40 mg total) by mouth daily. 90 capsule 3  . polyethylene glycol powder (GLYCOLAX/MIRALAX) powder mix 1 capful (17 grams) IN 8 ounces OF liquid EVERY DAY 1581 g 3  . potassium chloride (KLOR-CON) 8 MEQ tablet Take 1 tablet (8 mEq total) by mouth daily. 90 tablet 3  . traMADol (ULTRAM) 50 MG tablet TAKE ONE TABLET BY MOUTH EVERY 12 HOURS 60 tablet 5  . VOLTAREN 1 % GEL apply 4 grams 4 TIMES DAILY AS NEEDED FOR 30 DAYS 100 g 5  . warfarin (COUMADIN) 5 MG tablet Take 1 tablet daily except take 1/2 tablet on Wed and Sat or Take as directed by anticoagulation clinic 90 tablet 1   No facility-administered medications prior to visit.    ROS: Review of Systems  Constitutional: Positive for fatigue. Negative for activity change, appetite change, chills and unexpected weight change.  HENT: Negative for congestion, mouth sores and  sinus pressure.   Eyes: Negative for visual disturbance.  Respiratory: Negative for cough and chest tightness.   Cardiovascular: Positive for leg swelling.  Gastrointestinal: Negative for abdominal pain and nausea.  Genitourinary: Negative for difficulty urinating, frequency and vaginal pain.  Musculoskeletal: Positive for arthralgias, back pain and gait problem.  Skin: Positive for color change and wound. Negative for pallor and rash.  Neurological: Positive for weakness. Negative for dizziness, tremors, numbness and headaches.  Hematological: Bruises/bleeds easily.  Psychiatric/Behavioral: Negative for confusion, sleep disturbance and suicidal ideas.     Objective:  BP 132/70 (BP Location: Left Arm)   Pulse 69   Temp 98.6 F (37 C) (Oral)   Wt 281 lb (127.5 kg)   LMP  (LMP Unknown)   SpO2 98%   BMI 53.09 kg/m   BP Readings from Last 3 Encounters:  06/16/20 132/70  06/10/20 128/72  02/18/20 (!) 160/94    Wt Readings from Last 3 Encounters:  06/16/20 281 lb (127.5 kg)  06/10/20 281 lb 8 oz (127.7 kg)  02/18/20 (!) 309 lb (140.2 kg)    Physical Exam Constitutional:      General: She is not in acute distress.    Appearance: She is well-developed. She is obese.  HENT:     Head: Normocephalic.     Right Ear: External ear normal.     Left Ear: External ear normal.     Nose: Nose normal.  Eyes:     General:        Right eye: No discharge.        Left eye: No discharge.     Conjunctiva/sclera: Conjunctivae normal.     Pupils: Pupils are equal, round, and reactive to light.  Neck:     Thyroid: No thyromegaly.     Vascular: No JVD.     Trachea: No tracheal deviation.  Cardiovascular:     Rate and Rhythm: Normal rate and regular rhythm.     Heart sounds: Normal heart sounds.  Pulmonary:     Effort: No respiratory distress.     Breath sounds: No stridor. No wheezing.  Abdominal:     General: Bowel sounds are normal. There is no distension.     Palpations: Abdomen is soft. There is no mass.     Tenderness: There is no abdominal tenderness. There is no guarding or rebound.  Musculoskeletal:        General: No tenderness.     Cervical back: Normal range of motion and neck supple.  Lymphadenopathy:     Cervical: No cervical adenopathy.  Skin:    Findings: Erythema and lesion present. No rash.  Neurological:     Mental Status: She is oriented to person, place, and time.     Cranial Nerves: No cranial nerve deficit.     Motor: Weakness present. No abnormal muscle tone.     Coordination: Coordination abnormal.     Gait: Gait abnormal.     Deep Tendon Reflexes: Reflexes normal.  Psychiatric:        Behavior:  Behavior normal.        Thought Content: Thought content normal.        Judgment: Judgment normal.   In a wheelchair Morbidly obese LLE w/severe edema, more on the left side Erythema over distal shin on the left Ulcer on the left lateral heel L thumb -abscess and granuloma, ingrown thumbnail laterally  Procedure-simple abscess incision and drainage Indication abscess of the thumb Thumb skin with treated with Betadine.  Nail edge was trimmed.  Abscess incised.  Granulomatous skin removed.  The wound was cleaned.  The base was treated with silver nitrate stick.  Antibiotic ointment.  Band-Aid  Lab Results  Component Value Date   WBC 5.9 06/10/2020   HGB 9.5 (L) 06/10/2020   HCT 30.2 (L) 06/10/2020   PLT 281 06/10/2020   GLUCOSE 108 (H) 06/10/2020   ALT 33 06/10/2020   AST 24 06/10/2020   NA 137 06/10/2020   K 3.9 06/10/2020   CL 104 06/10/2020   CREATININE 2.09 (H) 06/10/2020   BUN 65 (H) 06/10/2020   CO2 20 (L) 06/10/2020   TSH 6.26 (H) 01/05/2018   INR 3.2 (A) 06/02/2020   HGBA1C 5.5 01/05/2018    MM DIAG BREAST TOMO BILATERAL  Result Date: 05/21/2020 CLINICAL DATA:  Status post left lumpectomy and radiation therapy for breast cancer in 2018. EXAM: DIGITAL DIAGNOSTIC BILATERAL MAMMOGRAM WITH TOMO AND CAD COMPARISON:  Previous examinations. ACR Breast Density Category b: There are scattered areas of fibroglandular density. FINDINGS: Stable post lumpectomy and postradiation changes on the left. No interval findings suspicious for malignancy in either breast. Mammographic images were processed with CAD. IMPRESSION: No evidence of malignancy. RECOMMENDATION: Bilateral diagnostic mammogram in 1 year. I have discussed the findings and recommendations with the patient. If applicable, a reminder letter will be sent to the patient regarding the next appointment. BI-RADS CATEGORY  2: Benign. Electronically Signed   By: Claudie Revering M.D.   On: 05/21/2020 10:37    Assessment & Plan:    There are no diagnoses linked to this encounter.   No orders of the defined types were placed in this encounter.    Follow-up: No follow-ups on file.  Walker Kehr, MD

## 2020-06-17 DIAGNOSIS — L02512 Cutaneous abscess of left hand: Secondary | ICD-10-CM | POA: Insufficient documentation

## 2020-06-17 DIAGNOSIS — L03116 Cellulitis of left lower limb: Secondary | ICD-10-CM | POA: Insufficient documentation

## 2020-06-17 DIAGNOSIS — L02416 Cutaneous abscess of left lower limb: Secondary | ICD-10-CM | POA: Insufficient documentation

## 2020-06-17 NOTE — Assessment & Plan Note (Addendum)
She is using continuous  sequential compression device at home

## 2020-06-17 NOTE — Assessment & Plan Note (Signed)
Continue with antibiotics. Get wide, soft shoes or socks with a rubber bottom

## 2020-06-17 NOTE — Assessment & Plan Note (Signed)
See procedure 

## 2020-06-17 NOTE — Assessment & Plan Note (Signed)
Wt Readings from Last 3 Encounters:  06/16/20 281 lb (127.5 kg)  06/10/20 281 lb 8 oz (127.7 kg)  02/18/20 (!) 309 lb (140.2 kg)  Continue with weight loss

## 2020-06-17 NOTE — Assessment & Plan Note (Addendum)
Wheelchair/walker

## 2020-06-18 ENCOUNTER — Ambulatory Visit (INDEPENDENT_AMBULATORY_CARE_PROVIDER_SITE_OTHER): Payer: Medicare HMO | Admitting: General Practice

## 2020-06-18 DIAGNOSIS — I4821 Permanent atrial fibrillation: Secondary | ICD-10-CM | POA: Diagnosis not present

## 2020-06-18 DIAGNOSIS — Z7901 Long term (current) use of anticoagulants: Secondary | ICD-10-CM | POA: Diagnosis not present

## 2020-06-18 DIAGNOSIS — I482 Chronic atrial fibrillation, unspecified: Secondary | ICD-10-CM | POA: Diagnosis not present

## 2020-06-18 LAB — POCT INR: INR: 3.5 — AB (ref 2.0–3.0)

## 2020-06-18 NOTE — Patient Instructions (Addendum)
Pre visit review using our clinic review tool, if applicable. No additional management support is needed unless otherwise documented below in the visit note.  Skip coumadin dosage today and then change dosage and take  1/2 tablet daily except 1 tablet on Monday Wed and Fridays.  Re-check in 2 weeks.  Call Villa Herb, RN with results as well as reporting to [place]. Dosing instructions have been given to patient's son, [name] and he did verbalize understanding.

## 2020-07-02 ENCOUNTER — Ambulatory Visit (INDEPENDENT_AMBULATORY_CARE_PROVIDER_SITE_OTHER): Payer: Medicare HMO | Admitting: General Practice

## 2020-07-02 DIAGNOSIS — I482 Chronic atrial fibrillation, unspecified: Secondary | ICD-10-CM | POA: Diagnosis not present

## 2020-07-02 DIAGNOSIS — Z7901 Long term (current) use of anticoagulants: Secondary | ICD-10-CM

## 2020-07-02 LAB — POCT INR: INR: 1.9 — AB (ref 2.0–3.0)

## 2020-07-02 NOTE — Patient Instructions (Signed)
Pre visit review using our clinic review tool, if applicable. No additional management support is needed unless otherwise documented below in the visit note.  Take 1 tablet today and then continue to take 1/2 tablet daily except 1 tablet on Monday Wed and Fridays.  Re-check in 2 weeks.  Call Villa Herb, RN with results as well as reporting to [place]. Dosing instructions have been given to patient's son, [name] and he did verbalize understanding.

## 2020-08-06 ENCOUNTER — Other Ambulatory Visit: Payer: Self-pay | Admitting: Internal Medicine

## 2020-08-06 ENCOUNTER — Ambulatory Visit (INDEPENDENT_AMBULATORY_CARE_PROVIDER_SITE_OTHER): Payer: Medicare HMO | Admitting: General Practice

## 2020-08-06 DIAGNOSIS — I482 Chronic atrial fibrillation, unspecified: Secondary | ICD-10-CM | POA: Diagnosis not present

## 2020-08-06 DIAGNOSIS — Z7901 Long term (current) use of anticoagulants: Secondary | ICD-10-CM | POA: Diagnosis not present

## 2020-08-06 LAB — POCT INR: INR: 2.5 (ref 2.0–3.0)

## 2020-08-06 NOTE — Patient Instructions (Signed)
Pre visit review using our clinic review tool, if applicable. No additional management support is needed unless otherwise documented below in the visit note.  Continue to take 1/2 tablet daily except 1 tablet on Monday Wed and Fridays.  Re-check in 2 weeks.  Call Villa Herb, RN with results as well as reporting to [place]. Dosing instructions have been given to patient's son, [name] and he did verbalize understanding.

## 2020-08-10 ENCOUNTER — Telehealth: Payer: Self-pay | Admitting: Emergency Medicine

## 2020-08-10 NOTE — Telephone Encounter (Signed)
Pts son called stating they need a letter stating what medical conditions the patient has. Pt is having to file an appeal to get home health care back in the home. Care takers hours have been cut. Please let pt know when this is ready. Thanks.

## 2020-08-11 NOTE — Telephone Encounter (Signed)
I agree. Please write a letter. Thanks

## 2020-08-12 NOTE — Telephone Encounter (Signed)
Generated letter, MD signed, notified son letter is ready for pick-up.Marland KitchenJohny Jackson

## 2020-08-12 NOTE — Telephone Encounter (Signed)
What medical condition do you want to put on letter

## 2020-08-12 NOTE — Telephone Encounter (Signed)
Breast cancer, lymphedema., morbid obesity, gait disorder, anemia Thx

## 2020-08-18 ENCOUNTER — Other Ambulatory Visit: Payer: Self-pay

## 2020-08-18 ENCOUNTER — Ambulatory Visit
Admission: RE | Admit: 2020-08-18 | Discharge: 2020-08-18 | Disposition: A | Discharge status: Home / Self Care | Payer: Medicare HMO | Source: Ambulatory Visit | Attending: Nurse Practitioner | Admitting: Nurse Practitioner

## 2020-08-18 DIAGNOSIS — M810 Age-related osteoporosis without current pathological fracture: Secondary | ICD-10-CM | POA: Diagnosis not present

## 2020-08-18 DIAGNOSIS — M85852 Other specified disorders of bone density and structure, left thigh: Secondary | ICD-10-CM

## 2020-08-18 DIAGNOSIS — Z780 Asymptomatic menopausal state: Secondary | ICD-10-CM | POA: Diagnosis not present

## 2020-08-20 ENCOUNTER — Other Ambulatory Visit: Payer: Self-pay | Admitting: Internal Medicine

## 2020-08-20 DIAGNOSIS — Z7901 Long term (current) use of anticoagulants: Secondary | ICD-10-CM

## 2020-08-20 MED ORDER — WARFARIN SODIUM 5 MG PO TABS: ORAL_TABLET | ORAL | 1 refills | Status: DC

## 2020-08-27 ENCOUNTER — Ambulatory Visit (INDEPENDENT_AMBULATORY_CARE_PROVIDER_SITE_OTHER): Payer: Medicare HMO | Admitting: General Practice

## 2020-08-27 DIAGNOSIS — Z7901 Long term (current) use of anticoagulants: Secondary | ICD-10-CM | POA: Diagnosis not present

## 2020-08-27 DIAGNOSIS — I482 Chronic atrial fibrillation, unspecified: Secondary | ICD-10-CM | POA: Diagnosis not present

## 2020-08-27 LAB — POCT INR: INR: 4.6 — AB (ref 2.0–3.0)

## 2020-08-27 NOTE — Patient Instructions (Signed)
Pre visit review using our clinic review tool, if applicable. No additional management support is needed unless otherwise documented below in the visit note.  Hold dosage today and tomorrow and then continue to take 1/2 tablet daily except 1 tablet on Monday Wed and Fridays.  Re-check in 1 weeks.  Call Villa Herb, RN with results as well as reporting to [place]. Dosing instructions have been given to patient's son, [name] and he did verbalize understanding.

## 2020-09-02 ENCOUNTER — Other Ambulatory Visit: Payer: Self-pay | Admitting: Internal Medicine

## 2020-09-02 MED ORDER — AMOXICILLIN-POT CLAVULANATE 875-125 MG PO TABS: 1.0000 | ORAL_TABLET | Freq: Two times a day (BID) | ORAL | 0 refills | Status: DC

## 2020-09-02 NOTE — Progress Notes (Signed)
Cellulitis LE

## 2020-09-04 DIAGNOSIS — Z7901 Long term (current) use of anticoagulants: Secondary | ICD-10-CM | POA: Diagnosis not present

## 2020-09-04 DIAGNOSIS — I4821 Permanent atrial fibrillation: Secondary | ICD-10-CM | POA: Diagnosis not present

## 2020-09-08 ENCOUNTER — Ambulatory Visit (INDEPENDENT_AMBULATORY_CARE_PROVIDER_SITE_OTHER): Payer: Medicare HMO | Admitting: General Practice

## 2020-09-08 DIAGNOSIS — Z7901 Long term (current) use of anticoagulants: Secondary | ICD-10-CM

## 2020-09-08 DIAGNOSIS — I482 Chronic atrial fibrillation, unspecified: Secondary | ICD-10-CM | POA: Diagnosis not present

## 2020-09-08 LAB — POCT INR: INR: 2.6 (ref 2.0–3.0)

## 2020-09-08 NOTE — Patient Instructions (Signed)
Pre visit review using our clinic review tool, if applicable. No additional management support is needed unless otherwise documented below in the visit note.  Continue to take 1/2 tablet daily except 1 tablet on Monday Wed and Fridays.  Re-check in 1 weeks.  Call Villa Herb, RN with results as well as reporting to [place]. Dosing instructions have been given to patient's son, Katelyn Jackson and he did verbalize understanding.

## 2020-09-16 ENCOUNTER — Other Ambulatory Visit: Payer: Self-pay | Admitting: Internal Medicine

## 2020-09-23 DIAGNOSIS — R600 Localized edema: Secondary | ICD-10-CM | POA: Diagnosis not present

## 2020-09-23 DIAGNOSIS — N184 Chronic kidney disease, stage 4 (severe): Secondary | ICD-10-CM | POA: Diagnosis not present

## 2020-09-23 DIAGNOSIS — I129 Hypertensive chronic kidney disease with stage 1 through stage 4 chronic kidney disease, or unspecified chronic kidney disease: Secondary | ICD-10-CM | POA: Diagnosis not present

## 2020-09-23 DIAGNOSIS — D649 Anemia, unspecified: Secondary | ICD-10-CM | POA: Diagnosis not present

## 2020-09-23 DIAGNOSIS — Z6841 Body Mass Index (BMI) 40.0 and over, adult: Secondary | ICD-10-CM | POA: Diagnosis not present

## 2020-09-23 DIAGNOSIS — M109 Gout, unspecified: Secondary | ICD-10-CM | POA: Diagnosis not present

## 2020-09-29 ENCOUNTER — Ambulatory Visit (INDEPENDENT_AMBULATORY_CARE_PROVIDER_SITE_OTHER): Payer: Medicare HMO | Admitting: General Practice

## 2020-09-29 DIAGNOSIS — Z7901 Long term (current) use of anticoagulants: Secondary | ICD-10-CM

## 2020-09-29 LAB — POCT INR: INR: 2.6 (ref 2.0–3.0)

## 2020-09-29 NOTE — Patient Instructions (Signed)
Pre visit review using our clinic review tool, if applicable. No additional management support is needed unless otherwise documented below in the visit note.  Continue to take 1/2 tablet daily except 1 tablet on Monday Wed and Fridays.  Re-check in 1 weeks.  Call Villa Herb, RN with results as well as reporting to [place]. Dosing instructions have been given to patient's son, [name] and he did verbalize understanding.

## 2020-10-08 ENCOUNTER — Other Ambulatory Visit: Payer: Self-pay

## 2020-10-08 ENCOUNTER — Ambulatory Visit (INDEPENDENT_AMBULATORY_CARE_PROVIDER_SITE_OTHER): Payer: Medicare HMO | Admitting: Internal Medicine

## 2020-10-08 ENCOUNTER — Encounter: Payer: Self-pay | Admitting: Internal Medicine

## 2020-10-08 DIAGNOSIS — I10 Essential (primary) hypertension: Secondary | ICD-10-CM | POA: Diagnosis not present

## 2020-10-08 DIAGNOSIS — I890 Lymphedema, not elsewhere classified: Secondary | ICD-10-CM | POA: Diagnosis not present

## 2020-10-08 DIAGNOSIS — M79604 Pain in right leg: Secondary | ICD-10-CM | POA: Insufficient documentation

## 2020-10-08 DIAGNOSIS — Z7901 Long term (current) use of anticoagulants: Secondary | ICD-10-CM

## 2020-10-08 DIAGNOSIS — N184 Chronic kidney disease, stage 4 (severe): Secondary | ICD-10-CM

## 2020-10-08 DIAGNOSIS — L02416 Cutaneous abscess of left lower limb: Secondary | ICD-10-CM

## 2020-10-08 DIAGNOSIS — M79605 Pain in left leg: Secondary | ICD-10-CM | POA: Diagnosis not present

## 2020-10-08 DIAGNOSIS — L03116 Cellulitis of left lower limb: Secondary | ICD-10-CM

## 2020-10-08 DIAGNOSIS — I482 Chronic atrial fibrillation, unspecified: Secondary | ICD-10-CM

## 2020-10-08 MED ORDER — LOVASTATIN 20 MG PO TABS
20.0000 mg | ORAL_TABLET | Freq: Every day | ORAL | 3 refills | Status: DC
Start: 2020-10-08 — End: 2021-07-30

## 2020-10-08 MED ORDER — EXEMESTANE 25 MG PO TABS
25.0000 mg | ORAL_TABLET | Freq: Every day | ORAL | 3 refills | Status: DC
Start: 2020-10-08 — End: 2021-09-10

## 2020-10-08 MED ORDER — LEVOTHYROXINE SODIUM 50 MCG PO TABS
50.0000 ug | ORAL_TABLET | Freq: Every day | ORAL | 3 refills | Status: DC
Start: 2020-10-08 — End: 2021-02-09

## 2020-10-08 MED ORDER — ALLOPURINOL 100 MG PO TABS
50.0000 mg | ORAL_TABLET | Freq: Every day | ORAL | 3 refills | Status: DC
Start: 2020-10-08 — End: 2021-07-30

## 2020-10-08 MED ORDER — FUROSEMIDE 40 MG PO TABS
ORAL_TABLET | ORAL | 1 refills | Status: DC
Start: 2020-10-08 — End: 2021-02-09

## 2020-10-08 MED ORDER — TRAMADOL HCL 50 MG PO TABS: 50.0000 mg | ORAL_TABLET | Freq: Two times a day (BID) | ORAL | 1 refills | Status: DC

## 2020-10-08 MED ORDER — IRBESARTAN-HYDROCHLOROTHIAZIDE 150-12.5 MG PO TABS
1.0000 | ORAL_TABLET | Freq: Every day | ORAL | 3 refills | Status: DC
Start: 2020-10-08 — End: 2021-02-09

## 2020-10-08 MED ORDER — OMEPRAZOLE 40 MG PO CPDR
40.0000 mg | DELAYED_RELEASE_CAPSULE | Freq: Every day | ORAL | 3 refills | Status: DC
Start: 2020-10-08 — End: 2021-11-25

## 2020-10-08 MED ORDER — POTASSIUM CHLORIDE ER 8 MEQ PO TBCR
8.0000 meq | EXTENDED_RELEASE_TABLET | Freq: Every day | ORAL | 3 refills | Status: DC
Start: 2020-10-08 — End: 2021-11-25

## 2020-10-08 MED ORDER — DICLOFENAC SODIUM 1 % EX GEL
CUTANEOUS | 5 refills | Status: DC
Start: 2020-10-08 — End: 2021-06-11

## 2020-10-08 MED ORDER — PANCRELIPASE (LIP-PROT-AMYL) 12000-38000 UNITS PO CPEP
12000.0000 [IU] | ORAL_CAPSULE | Freq: Every day | ORAL | 3 refills | Status: DC | PRN
Start: 2020-10-08 — End: 2021-11-25

## 2020-10-08 MED ORDER — CARVEDILOL 6.25 MG PO TABS
6.2500 mg | ORAL_TABLET | Freq: Two times a day (BID) | ORAL | 3 refills | Status: DC
Start: 2020-10-08 — End: 2021-05-11

## 2020-10-08 MED ORDER — WARFARIN SODIUM 5 MG PO TABS: ORAL_TABLET | ORAL | 1 refills | Status: DC

## 2020-10-08 NOTE — Patient Instructions (Addendum)
?  adiponecrosis CBD ointment/balm Voltaren gel

## 2020-10-08 NOTE — Assessment & Plan Note (Addendum)
?  adiponecrosis  CBD ointment/balm Voltaren gel Wt loss Tramadol prn

## 2020-10-08 NOTE — Progress Notes (Signed)
Subjective:  Patient ID: Katelyn Jackson, female    DOB: 1946-09-04  Age: 74 y.o. MRN: [id number]  CC: Leg Swelling   HPI [name] presents for distal  LLE pain - worse.  She stopped using SCD due to pain Eating a lot of sweets, carbs Just had full labs w/Nephrol  Outpatient Medications Prior to Visit  Medication Sig Dispense Refill  . allopurinol (ZYLOPRIM) 100 MG tablet Take 0.5 tablets (50 mg total) by mouth daily. 45 tablet 0  . Calcium-Magnesium-Vitamin D (CALCIUM 500 PO) Take 1 tablet by mouth daily.    . carvedilol (COREG) 6.25 MG tablet Take 1 tablet (6.25 mg total) by mouth 2 (two) times daily with a meal. Overdue for Annual appt must see provider for future refills 60 tablet 0  . Cholecalciferol (VITAMIN D3) 2000 units capsule Take 1 capsule (2,000 Units total) by mouth daily. 100 capsule 3  . clindamycin (CLEOCIN) 300 MG capsule Take 1 capsule (300 mg total) by mouth 3 (three) times daily. 21 capsule 0  . Cyanocobalamin (VITAMIN B-12 PO) Take 1 tablet by mouth daily.    . diclofenac Sodium (VOLTAREN) 1 % GEL APPLY 4 GRAMS FOUR TIMES DAILY AS NEEDED 100 g 5  . exemestane (AROMASIN) 25 MG tablet Take 1 tablet (25 mg total) by mouth daily after breakfast. 90 tablet 3  . ferrous sulfate 325 (65 FE) MG tablet Take 325 mg by mouth daily with breakfast.    . furosemide (LASIX) 40 MG tablet TAKE 1 TABLET BY MOUTH DAILY AS NEEDED 90 tablet 1  . irbesartan-hydrochlorothiazide (AVALIDE) 150-12.5 MG tablet TAKE 1 TABLET BY MOUTH DAILY 90 tablet 3  . levothyroxine (SYNTHROID) 50 MCG tablet Take 1 tablet (50 mcg total) by mouth daily before breakfast. Overdue for Annual appt must see provider for future refills 90 tablet 3  . lipase/protease/amylase (CREON) 12000 units CPEP capsule Take 1 capsule (12,000 Units total) by mouth daily as needed (stomach problems). 90 capsule 0  . lovastatin (MEVACOR) 20 MG tablet Take 1 tablet (20 mg total) by mouth daily. 90 tablet 3  . omeprazole  (PRILOSEC) 40 MG capsule Take 1 capsule (40 mg total) by mouth daily. Overdue for Annual appt must see provider for future refills 30 capsule 0  . polyethylene glycol powder (GLYCOLAX/MIRALAX) powder mix 1 capful (17 grams) IN 8 ounces OF liquid EVERY DAY 1581 g 3  . potassium chloride (KLOR-CON) 8 MEQ tablet Take 1 tablet (8 mEq total) by mouth daily. 90 tablet 3  . traMADol (ULTRAM) 50 MG tablet TAKE ONE TABLET BY MOUTH EVERY 12 HOURS 60 tablet 5  . triamcinolone ointment (KENALOG) 0.1 % Apply 1 application topically 2 (two) times daily. 450 g 2  . VOLTAREN 1 % GEL apply 4 grams 4 TIMES DAILY AS NEEDED FOR 30 DAYS 100 g 5  . warfarin (COUMADIN) 5 MG tablet Take 1 tablet daily except take 1/2 tablet on Wed and Sat or Take as directed by anticoagulation clinic 90 tablet 1  . amoxicillin-clavulanate (AUGMENTIN) 875-125 MG tablet Take 1 tablet by mouth 2 (two) times daily. (Patient not taking: Reported on 10/08/2020) 20 tablet 0  . lidocaine (LIDODERM) 5 % Place 1-2 patches onto the skin daily. Remove & Discard patch within 12 hours or as directed by MD (Patient not taking: Reported on 10/08/2020) 60 patch 5  . methocarbamol (ROBAXIN) 500 MG tablet Take 1 tablet (500 mg total) by mouth 4 (four) times daily. (Patient not taking: Reported on 10/08/2020) 120  tablet 5   No facility-administered medications prior to visit.    ROS: Review of Systems  Constitutional: Negative for activity change, appetite change, chills, fatigue and unexpected weight change.  HENT: Negative for congestion, mouth sores and sinus pressure.   Eyes: Negative for visual disturbance.  Respiratory: Positive for shortness of breath and wheezing. Negative for cough and chest tightness.   Cardiovascular: Positive for leg swelling.  Gastrointestinal: Negative for abdominal pain and nausea.  Genitourinary: Negative for difficulty urinating, frequency and vaginal pain.  Musculoskeletal: Positive for arthralgias, back pain and gait  problem.  Skin: Negative for pallor, rash and wound.  Neurological: Positive for weakness. Negative for dizziness, tremors, numbness and headaches.  Psychiatric/Behavioral: Negative for confusion, sleep disturbance and suicidal ideas. The patient is nervous/anxious.     Objective:  BP 132/80 (BP Location: Right Arm)   Pulse 78   Temp 98.5 F (36.9 C) (Oral)   Wt 282 lb (127.9 kg)   LMP  (LMP Unknown)   SpO2 98%   BMI 53.28 kg/m   BP Readings from Last 3 Encounters:  10/08/20 132/80  06/16/20 132/70  06/10/20 128/72    Wt Readings from Last 3 Encounters:  10/08/20 282 lb (127.9 kg)  06/16/20 281 lb (127.5 kg)  06/10/20 281 lb 8 oz (127.7 kg)    Physical Exam Constitutional:      General: She is not in acute distress.    Appearance: She is well-developed.  HENT:     Head: Normocephalic.     Right Ear: External ear normal.     Left Ear: External ear normal.     Nose: Nose normal.     Mouth/Throat:     Mouth: Oropharynx is clear and moist.  Eyes:     General:        Right eye: No discharge.        Left eye: No discharge.     Conjunctiva/sclera: Conjunctivae normal.     Pupils: Pupils are equal, round, and reactive to light.  Neck:     Thyroid: No thyromegaly.     Vascular: No JVD.     Trachea: No tracheal deviation.  Cardiovascular:     Rate and Rhythm: Normal rate and regular rhythm.     Heart sounds: Normal heart sounds.  Pulmonary:     Effort: No respiratory distress.     Breath sounds: No stridor. No wheezing.  Abdominal:     General: Bowel sounds are normal. There is no distension.     Palpations: Abdomen is soft. There is no mass.     Tenderness: There is no abdominal tenderness. There is no guarding or rebound.  Musculoskeletal:        General: Tenderness present.     Cervical back: Normal range of motion and neck supple.     Right lower leg: Edema present.     Left lower leg: Edema present.  Lymphadenopathy:     Cervical: No cervical adenopathy.   Skin:    Findings: No erythema or rash.  Neurological:     Mental Status: She is oriented to person, place, and time.     Cranial Nerves: No cranial nerve deficit.     Motor: No abnormal muscle tone.     Coordination: Coordination abnormal.     Gait: Gait abnormal.     Deep Tendon Reflexes: Reflexes normal.  Psychiatric:        Mood and Affect: Mood and affect normal.  Behavior: Behavior normal.        Thought Content: Thought content normal.        Judgment: Judgment normal.    B LEs w/3+ lymphedema Left lateral foot and distal lateral shin-tender to palpation.  No rash or erythema Walking with a walker Lab Results  Component Value Date   WBC 5.9 06/10/2020   HGB 9.5 (L) 06/10/2020   HCT 30.2 (L) 06/10/2020   PLT 281 06/10/2020   GLUCOSE 108 (H) 06/10/2020   ALT 33 06/10/2020   AST 24 06/10/2020   NA 137 06/10/2020   K 3.9 06/10/2020   CL 104 06/10/2020   CREATININE 2.09 (H) 06/10/2020   BUN 65 (H) 06/10/2020   CO2 20 (L) 06/10/2020   TSH 6.26 (H) 01/05/2018   INR 2.6 09/29/2020   HGBA1C 5.5 01/05/2018    DG Bone Density  Result Date: 08/18/2020 EXAM: DUAL X-RAY ABSORPTIOMETRY (DXA) FOR BONE MINERAL DENSITY IMPRESSION: Referring Physician:  Alla Feeling Your patient completed a BMD test using Lunar IDXA DXA system ( analysis version: 16 ) manufactured by [employer]. Technologist: AW PATIENT: Name: Anneth, Brunell Patient ID: [id number] Birth Date: August 15, 1946 Height: 59.0 in. Sex: Female Measured: 08/18/2020 Weight: 277.2 lbs. Indications: Advanced Age, Aromasin, Breast Cancer History, Caucasian, Estrogen Deficient, Exemestane, Hypothyroid, Levothyroxine, Omeprazole, Postmenopausal Fractures: None Treatments: Calcium (E943.0), Hormone Therapy For Cancer, Vitamin D (E933.5) ASSESSMENT: The BMD measured at Femur Neck Right is 0.693 g/cm2 with a T-score of -2.5. This patient is considered osteoporotic according to [facility]) criteria. The  scan quality is good. L-3 and L-4 were excluded due to degenerative changes. Site Region Measured Date Measured Age YA BMD Significant CHANGE T-score AP Spine L1-L2 08/18/2020 74.7 -2.5 0.875 g/cm2 * AP Spine  L1-L2      08/28/2017    71.7         -1.2    1.033 g/cm2 DualFemur Neck Right 08/18/2020 74.7 -2.5 0.693 g/cm2 * DualFemur Neck Right 08/28/2017    71.7         -1.0    0.894 g/cm2 DualFemur Total Mean 08/18/2020 74.7 -1.6 0.809 g/cm2 * DualFemur Total Mean 08/28/2017    71.7         -0.4    0.963 g/cm2 World Health Organization [facility]) criteria for post-menopausal, Caucasian Women: Normal       T-score at or above -1 SD Osteopenia   T-score between -1 and -2.5 SD Osteoporosis T-score at or below -2.5 SD RECOMMENDATION: 1. All patients should optimize calcium and vitamin D intake. 2. Consider FDA approved medical therapies in postmenopausal women and men aged 29 years and older, based on the following: a. A hip or vertebral (clinical or morphometric) fracture b. T- score < or = -2.5 at the femoral neck or spine after appropriate evaluation to exclude secondary causes c. Low bone mass (T-score between -1.0 and -2.5 at the femoral neck or spine) and a 10 year probability of a hip fracture > or = 3% or a 10 year probability of a major osteoporosis-related fracture > or = 20% based on the [place]-adapted WHO algorithm d. Clinician judgment and/or patient preferences may indicate treatment for people with 10-year fracture probabilities above or below these levels FOLLOW-UP: Patients with diagnosis of osteoporosis or at high risk for fracture should have regular bone mineral density tests. For patients eligible for Medicare, routine testing is allowed once every 2 years. The testing frequency can be increased to one year for patients  who have rapidly progressing disease, those who are receiving or discontinuing medical therapy to restore bone mass, or have additional risk factors. I have reviewed this report and agree  with the above findings. [facility] Electronically Signed   By: Lowella Grip III M.D.   On: 08/18/2020 11:14    Assessment & Plan:   There are no diagnoses linked to this encounter.   No orders of the defined types were placed in this encounter.    Follow-up: No follow-ups on file.  Walker Kehr, MD

## 2020-10-19 ENCOUNTER — Encounter: Payer: Self-pay | Admitting: Internal Medicine

## 2020-10-19 NOTE — Assessment & Plan Note (Addendum)
There is no evidence of cellulitis at present she may have adipo necrosis in the distal lateral shin and foot.  Will watch.  Treat edema.  CBD ointment, Voltaren gel topically

## 2020-10-19 NOTE — Assessment & Plan Note (Signed)
Rate controlled.  Continue with Coumadin

## 2020-10-19 NOTE — Assessment & Plan Note (Signed)
Follow-up with nephrology. 

## 2020-10-19 NOTE — Assessment & Plan Note (Signed)
Not better.  She has been eating a lot of sweets.  Advised to cut back on starches and sugars

## 2020-10-19 NOTE — Assessment & Plan Note (Signed)
Follow-up with nephrology.  Blood pressure is normal.  Continue with furosemide, Avalide HCT, Coreg

## 2020-10-19 NOTE — Assessment & Plan Note (Signed)
Edema is worse.  Restart using SCD device

## 2020-11-03 ENCOUNTER — Telehealth: Payer: Self-pay | Admitting: Internal Medicine

## 2020-11-03 NOTE — Telephone Encounter (Signed)
[facility] calling to make [place] aware that they started the patient on allopurinol (ZYLOPRIM) 100 MG tablet and wanted to let us know just because the possible interaction with the blood thinner  [phone]

## 2020-11-03 NOTE — Telephone Encounter (Signed)
Noted. Thanks.

## 2020-11-03 NOTE — Telephone Encounter (Signed)
Sent to Dr. [name].

## 2020-12-07 ENCOUNTER — Ambulatory Visit: Payer: Self-pay | Admitting: General Practice

## 2020-12-07 DIAGNOSIS — Z7901 Long term (current) use of anticoagulants: Secondary | ICD-10-CM

## 2020-12-07 DIAGNOSIS — I4821 Permanent atrial fibrillation: Secondary | ICD-10-CM | POA: Diagnosis not present

## 2020-12-07 DIAGNOSIS — I482 Chronic atrial fibrillation, unspecified: Secondary | ICD-10-CM

## 2020-12-07 LAB — POCT INR: INR: 2 (ref 2.0–3.0)

## 2020-12-08 DIAGNOSIS — R55 Syncope and collapse: Secondary | ICD-10-CM

## 2020-12-08 DIAGNOSIS — J189 Pneumonia, unspecified organism: Secondary | ICD-10-CM

## 2020-12-08 HISTORY — DX: Pneumonia, unspecified organism: J18.9

## 2020-12-08 HISTORY — DX: Syncope and collapse: R55

## 2020-12-22 ENCOUNTER — Other Ambulatory Visit: Payer: Self-pay

## 2020-12-22 ENCOUNTER — Emergency Department (HOSPITAL_COMMUNITY)
Admission: EM | Admit: 2020-12-22 | Discharge: 2020-12-22 | Disposition: A | Discharge status: Home / Self Care | Payer: Medicare HMO | Attending: Emergency Medicine | Admitting: Emergency Medicine

## 2020-12-22 DIAGNOSIS — M79605 Pain in left leg: Secondary | ICD-10-CM | POA: Insufficient documentation

## 2020-12-22 DIAGNOSIS — Z9012 Acquired absence of left breast and nipple: Secondary | ICD-10-CM | POA: Diagnosis not present

## 2020-12-22 DIAGNOSIS — M549 Dorsalgia, unspecified: Secondary | ICD-10-CM | POA: Diagnosis not present

## 2020-12-22 DIAGNOSIS — Z7901 Long term (current) use of anticoagulants: Secondary | ICD-10-CM | POA: Diagnosis not present

## 2020-12-22 DIAGNOSIS — I129 Hypertensive chronic kidney disease with stage 1 through stage 4 chronic kidney disease, or unspecified chronic kidney disease: Secondary | ICD-10-CM | POA: Insufficient documentation

## 2020-12-22 DIAGNOSIS — R0902 Hypoxemia: Secondary | ICD-10-CM | POA: Diagnosis not present

## 2020-12-22 DIAGNOSIS — N184 Chronic kidney disease, stage 4 (severe): Secondary | ICD-10-CM | POA: Insufficient documentation

## 2020-12-22 DIAGNOSIS — Z853 Personal history of malignant neoplasm of breast: Secondary | ICD-10-CM | POA: Insufficient documentation

## 2020-12-22 DIAGNOSIS — Z79899 Other long term (current) drug therapy: Secondary | ICD-10-CM | POA: Insufficient documentation

## 2020-12-22 DIAGNOSIS — R000 Tachycardia, unspecified: Secondary | ICD-10-CM | POA: Diagnosis not present

## 2020-12-22 DIAGNOSIS — E039 Hypothyroidism, unspecified: Secondary | ICD-10-CM | POA: Insufficient documentation

## 2020-12-22 DIAGNOSIS — I482 Chronic atrial fibrillation, unspecified: Secondary | ICD-10-CM | POA: Diagnosis not present

## 2020-12-22 DIAGNOSIS — G8929 Other chronic pain: Secondary | ICD-10-CM | POA: Diagnosis not present

## 2020-12-22 DIAGNOSIS — M5442 Lumbago with sciatica, left side: Secondary | ICD-10-CM | POA: Diagnosis not present

## 2020-12-22 DIAGNOSIS — I4891 Unspecified atrial fibrillation: Secondary | ICD-10-CM | POA: Diagnosis not present

## 2020-12-22 DIAGNOSIS — R609 Edema, unspecified: Secondary | ICD-10-CM | POA: Diagnosis not present

## 2020-12-22 DIAGNOSIS — M545 Low back pain, unspecified: Secondary | ICD-10-CM | POA: Diagnosis present

## 2020-12-22 MED ORDER — METHOCARBAMOL 500 MG PO TABS: 500.0000 mg | ORAL_TABLET | Freq: Two times a day (BID) | ORAL | 0 refills | Status: DC

## 2020-12-22 MED ORDER — METHOCARBAMOL 500 MG PO TABS
1000.0000 mg | ORAL_TABLET | Freq: Once | ORAL | Status: AC
  Administered 2020-12-22: 1000 mg via ORAL
  Filled 2020-12-22: qty 2

## 2020-12-22 MED ORDER — ACETAMINOPHEN 500 MG PO TABS
1000.0000 mg | ORAL_TABLET | Freq: Once | ORAL | Status: AC
  Administered 2020-12-22: 1000 mg via ORAL
  Filled 2020-12-22: qty 2

## 2020-12-22 NOTE — ED Triage Notes (Signed)
From home, c/o left lower back pain that radiates down leg. Hx of back issues with cortisone shots. Tried tramadol at home that did not help

## 2020-12-22 NOTE — ED Notes (Signed)
Interpreter set up in front of pt and questions asked regarding patient condition, history, and reason for visit

## 2020-12-22 NOTE — ED Provider Notes (Signed)
[facility] DEPT Provider Note  CSN: [id number] Arrival date & time: 12/22/20 0224  Chief Complaint(s) Back Pain  HPI Katelyn Jackson is a 75 y.o. female with extensive past medical history listed below including chronic back pain due to arthritis and foraminal stenosis who presents to the emergency department with exacerbation of her back pain that began 1 to 2 days ago.  She reports that she was sitting on the couch and try to adjust when the pain severely worsened.  Since then she has been having fluctuating severe back spasms making mobility difficult.  She reports that the pain is cramping and shooting radiating down to her left leg.  It is improved with certain positions.  She has tried taking Tylenol with very mild relief.  Reports that she does not have any other pain medicine or muscle relaxers at home.  She reports that she has seen orthopedic surgery in the past and has gotten injections in her back which significantly helped.  No recent instrumentation, though.  She denies any falls or trauma.  Denies any bladder/bowel incontinence.  No urinary symptoms.  No abdominal pain.  No other physical complaints.  The history is provided by the patient. The history is limited by a language barrier. A language interpreter was used.    Past Medical History Past Medical History:  Diagnosis Date  . Arthritis   . Cancer [facility])    breast cancer  . Chronic atrial fibrillation ([place])   . Chronic kidney disease    sees Dr Florene Glen  . Cramps, muscle, general   . Dyspnea   . Dyspnea on exertion   . Dysrhythmia   . GERD (gastroesophageal reflux disease)   . Headache   . Hyperlipidemia   . Hypertension   . Hypothyroidism   . Lymphedema   . Moderate to severe pulmonary hypertension ([place])   . Obesity   . Personal history of radiation therapy    Patient Active Problem List   Diagnosis Date Noted  . Leg pain, lateral, left 10/08/2020  . Abscess of left thumb  06/17/2020  . Cellulitis and abscess of left leg 06/17/2020  . Herpes zoster 02/18/2020  . GERD (gastroesophageal reflux disease) 11/25/2019  . Wheezing 10/26/2017  . Sciatica of left side 08/29/2017  . Long term (current) use of anticoagulants 08/02/2017  . Morbid obesity ([place]) 07/11/2017  . Hyperglycemia 07/11/2017  . Low back pain 06/19/2017  . CRF (chronic renal failure), stage 4 (severe) ([place]) 06/19/2017  . Hypokalemia 06/19/2017  . Acute lower UTI 06/19/2017  . Anemia of chronic disease 02/16/2017  . Malignant neoplasm of upper-outer quadrant of left breast in female, estrogen receptor positive ([place]) 02/10/2017  . Essential hypertension 04/05/2016  . Dyslipidemia 04/05/2016  . Chronic atrial fibrillation ([place]) 04/05/2016  . Pulmonary hypertension ([place]) 04/05/2016  . Lymphedema 04/05/2016   Home Medication(s) Prior to Admission medications   Medication Sig Start Date End Date Taking? Authorizing Provider  methocarbamol (ROBAXIN) 500 MG tablet Take 1 tablet (500 mg total) by mouth 2 (two) times daily. 12/22/20  Yes Jani Moronta, Grayce Sessions, MD  allopurinol (ZYLOPRIM) 100 MG tablet Take 0.5 tablets (50 mg total) by mouth daily. 10/08/20   Plotnikov, [name], MD  Calcium-Magnesium-Vitamin D (CALCIUM 500 PO) Take 1 tablet by mouth daily.    [provider]  carvedilol (COREG) 6.25 MG tablet Take 1 tablet (6.25 mg total) by mouth 2 (two) times daily with a meal. Overdue for Annual appt must see provider for future  refills 10/08/20   Plotnikov, [name], MD  Cholecalciferol (VITAMIN D3) 2000 units capsule Take 1 capsule (2,000 Units total) by mouth daily. 07/11/17   Plotnikov, [name], MD  clindamycin (CLEOCIN) 300 MG capsule Take 1 capsule (300 mg total) by mouth 3 (three) times daily. 06/10/20   Truitt Merle, MD  Cyanocobalamin (VITAMIN B-12 PO) Take 1 tablet by mouth daily.    [provider]  diclofenac Sodium (VOLTAREN) 1 % GEL APPLY 4 GRAMS FOUR TIMES DAILY AS NEEDED  10/08/20   Plotnikov, [name], MD  exemestane (AROMASIN) 25 MG tablet Take 1 tablet (25 mg total) by mouth daily after breakfast. 10/08/20   Plotnikov, [name], MD  ferrous sulfate 325 (65 FE) MG tablet Take 325 mg by mouth daily with breakfast.    [provider]  furosemide (LASIX) 40 MG tablet TAKE 1 TABLET BY MOUTH DAILY AS NEEDED 10/08/20   Plotnikov, [name], MD  irbesartan-hydrochlorothiazide (AVALIDE) 150-12.5 MG tablet Take 1 tablet by mouth daily. 10/08/20   Plotnikov, [name], MD  levothyroxine (SYNTHROID) 50 MCG tablet Take 1 tablet (50 mcg total) by mouth daily before breakfast. Overdue for Annual appt must see provider for future refills 10/08/20   Plotnikov, [name], MD  lipase/protease/amylase (CREON) 12000-38000 units CPEP capsule Take 1 capsule (12,000 Units total) by mouth daily as needed (stomach problems). 10/08/20   Plotnikov, [name], MD  lovastatin (MEVACOR) 20 MG tablet Take 1 tablet (20 mg total) by mouth daily. 10/08/20   Plotnikov, [name], MD  omeprazole (PRILOSEC) 40 MG capsule Take 1 capsule (40 mg total) by mouth daily. Overdue for Annual appt must see provider for future refills 10/08/20   Plotnikov, [name], MD  polyethylene glycol powder (GLYCOLAX/MIRALAX) powder mix 1 capful (17 grams) IN 8 ounces OF liquid EVERY DAY 11/05/18   Plotnikov, [name], MD  potassium chloride (KLOR-CON) 8 MEQ tablet Take 1 tablet (8 mEq total) by mouth daily. 10/08/20   Plotnikov, [name], MD  traMADol (ULTRAM) 50 MG tablet Take 1 tablet (50 mg total) by mouth every 12 (twelve) hours. 10/08/20   Plotnikov, [name], MD  triamcinolone ointment (KENALOG) 0.1 % Apply 1 application topically 2 (two) times daily. 06/16/20   Plotnikov, [name], MD  VOLTAREN 1 % GEL apply 4 grams 4 TIMES DAILY AS NEEDED FOR 30 DAYS 07/26/19   Plotnikov, [name], MD  warfarin (COUMADIN) 5 MG tablet Take 1 tablet daily except take 1/2 tablet on Wed and Sat or Take as directed by  anticoagulation clinic 10/08/20   Plotnikov, [name], MD                                                                                                                                    Past Surgical History Past Surgical History:  Procedure Laterality Date  . APPENDECTOMY    . BREAST BIOPSY Left 2018  .  BREAST LUMPECTOMY Left 04/04/2017   x3  . BREAST LUMPECTOMY WITH NEEDLE LOCALIZATION AND AXILLARY SENTINEL LYMPH NODE BX Left 04/04/2017   Procedure: BREAST LUMPECTOMY WITH NEEDLE LOCALIZATION x3 AND AXILLARY SENTINEL LYMPH NODE BX;  Surgeon: [name], MD;  Location: [facility];  Service: General;  Laterality: Left;  . CATARACT EXTRACTION W/ INTRAOCULAR LENS  IMPLANT, BILATERAL Bilateral 2018  . COLONOSCOPY    . EYE SURGERY    . GLAUCOMA SURGERY Bilateral 2018  . RE-EXCISION OF BREAST CANCER,SUPERIOR MARGINS Left 04/26/2017   Procedure: RE-EXCISION OF LEFT BREAST CANCER;  Surgeon: [name], MD;  Location: [facility];  Service: General;  Laterality: Left;   Family History Family History  Problem Relation Age of Onset  . CAD Mother 67  . Stroke Mother 56       hemorr CVA  . COPD Father 29  . Cancer Neg Hx     Social History Social History   Tobacco Use  . Smoking status: Never Smoker  . Smokeless tobacco: Never Used  Vaping Use  . Vaping Use: Never used  Substance Use Topics  . Alcohol use: No    Alcohol/week: 0.0 standard drinks  . Drug use: No   Allergies Chlorhexidine gluconate and Penicillins  Review of Systems Review of Systems All other systems are reviewed and are negative for acute change except as noted in the HPI  Physical Exam Vital Signs  I have reviewed the triage vital signs BP 129/71   Pulse (!) 53   Temp 97.9 F (36.6 C) (Oral)   Resp (!) 22   Ht 5\' 1"  (1.549 m)   Wt 123.8 kg   LMP  (LMP Unknown)   SpO2 98%   BMI 51.56 kg/m   Physical Exam Vitals reviewed.  Constitutional:      General: She is not in acute distress.    Appearance: She  is well-developed and well-nourished. She is morbidly obese. She is not diaphoretic.  HENT:     Head: Normocephalic and atraumatic.     Right Ear: External ear normal.     Left Ear: External ear normal.     Nose: Nose normal.  Eyes:     General: No scleral icterus.    Extraocular Movements: EOM normal.     Conjunctiva/sclera: Conjunctivae normal.  Neck:     Trachea: Phonation normal.  Cardiovascular:     Rate and Rhythm: Normal rate and regular rhythm.  Pulmonary:     Effort: Pulmonary effort is normal. No respiratory distress.     Breath sounds: No stridor.  Abdominal:     General: There is no distension.  Musculoskeletal:        General: No edema. Normal range of motion.     Cervical back: Normal range of motion.     Lumbar back: Spasms and tenderness present. No bony tenderness.       Back:     Comments: Severe BLE lymphedema  Neurological:     Mental Status: She is alert and oriented to person, place, and time.     Comments: Spine Exam: Strength: 5/5 LE bilaterally  Sensation: Intact to light touch in proximal and distal LE bilaterally Reflexes. no clonus   Psychiatric:        Mood and Affect: Mood and affect normal.        Behavior: Behavior normal.     ED Results and Treatments Labs (all labs ordered are listed, but only abnormal results are displayed) Labs Reviewed - No  data to display                                                                                                                       EKG  EKG Interpretation  Date/Time:    Ventricular Rate:    PR Interval:    QRS Duration:   QT Interval:    QTC Calculation:   R Axis:     Text Interpretation:        Radiology No results found.  Pertinent labs & imaging results that were available during my care of the patient were reviewed by me and considered in my medical decision making (see chart for details).  Medications Ordered in ED Medications  acetaminophen (TYLENOL) tablet 1,000 mg  (1,000 mg Oral Given 12/22/20 0419)  methocarbamol (ROBAXIN) tablet 1,000 mg (1,000 mg Oral Given 12/22/20 0419)                                                                                                                                    Procedures Procedures  (including critical care time)  Medical Decision Making / ED Course I have reviewed the nursing notes for this encounter and the patient's prior records (if available in EHR or on provided paperwork).   Katelyn Jackson was evaluated in Emergency Department on 12/22/2020 for the symptoms described in the history of present illness. She was evaluated in the context of the global COVID-19 pandemic, which necessitated consideration that the patient might be at risk for infection with the SARS-CoV-2 virus that causes COVID-19. Institutional protocols and algorithms that pertain to the evaluation of patients at risk for COVID-19 are in a state of rapid change based on information released by regulatory bodies including the CDC and federal and state organizations. These policies and algorithms were followed during the patient's care in the ED.  75 y.o. female presents with back pain in lumbar area for with signs of radicular pain. No acute traumatic onset. No red flag symptoms of fever, weight loss, saddle anesthesia, weakness, fecal/urinary incontinence or urinary retention.   Suspect muscle strain/spasm vs radiculopathy etiology. Doubt cauda equina or epidural hematoma. No indication for imaging emergently. Patient was recommended to take short course of scheduled tylenol and engage in early mobility as definitive treatment. Will Rx Robaxin. Home PT/OT ordered.  Son called and updated.  Return precautions discussed for worsening or new concerning symptoms.  Final Clinical Impression(s) / ED Diagnoses Final diagnoses:  Chronic bilateral low back pain with left-sided sciatica    The patient appears reasonably screened and/or  stabilized for discharge and I doubt any other medical condition or other [facility] requiring further screening, evaluation, or treatment in the ED at this time prior to discharge. Safe for discharge with strict return precautions.  Disposition: Discharge  Condition: Good  I have discussed the results, Dx and Tx plan with the patient/family who expressed understanding and agree(s) with the plan. Discharge instructions discussed at length. The patient/family was given strict return precautions who verbalized understanding of the instructions. No further questions at time of discharge.    ED Discharge Orders         Ordered    methocarbamol (ROBAXIN) 500 MG tablet  2 times daily        12/22/20 2355            Follow Up: Cassandria Anger, MD [place] [id number] [phone]  Call  to schedule an appointment for close follow up     This chart was dictated using voice recognition software.  Despite best efforts to proofread,  errors can occur which can change the documentation meaning.   Fatima Blank, MD 12/22/20 [phone]

## 2020-12-22 NOTE — Discharge Instructions (Signed)
You may use over-the-counter Acetaminophen (Tylenol)[1000 mg every 8 hours as needed], topical muscle creams such as SalonPas, [employer], Bengay, etc. Please stretch, apply ice or heat (whichever helps). You may also use TENS unit and/or have massage therapy for additional assistance.   ?? ?????? ???????????? ?????????????? ???????????? (????????) [1000 ?? ?????? 8 ????? ?? ???? ?????????????], ????? ??? ???? ???????? ??????????, ????? ??? SalonPas, Icy Hot, Bengay ? ?. ?. ??????????, ??????????, ????????? ??? ??? ????? (? ??????????? ?? ????, ??? ????????). ?? ????? ?????? ???????????? ?????????? TENS ?/??? ?????? ??? ?????????????? ??????.  Vy mozhete ispol'zovat' bezretsepturnyy atsetaminofen (taylenol) [1000 mg kazhdyye 8 chasov po mere neobkhodimosti], kremy dlya myshts mestnogo primeneniya, takiye kak SalonPas, Icy Hot, [place] i t. d. Pozhaluysta, potyanites', prilozhite led ili teplo (v zavisimosti ot [place], chto pomogayet). Vy takzhe mozhete ispol'zovat' ustroystvo TENS i/ili massazh dlya dopolnitel'noy pomoshchi.

## 2020-12-30 LAB — PROTIME-INR: INR: 3.4 — AB (ref 0.9–1.1)

## 2021-01-05 DIAGNOSIS — L89153 Pressure ulcer of sacral region, stage 3: Secondary | ICD-10-CM | POA: Diagnosis not present

## 2021-01-05 DIAGNOSIS — N390 Urinary tract infection, site not specified: Secondary | ICD-10-CM | POA: Diagnosis not present

## 2021-01-05 DIAGNOSIS — R001 Bradycardia, unspecified: Secondary | ICD-10-CM | POA: Diagnosis not present

## 2021-01-05 DIAGNOSIS — I447 Left bundle-branch block, unspecified: Secondary | ICD-10-CM | POA: Diagnosis not present

## 2021-01-05 DIAGNOSIS — I081 Rheumatic disorders of both mitral and tricuspid valves: Secondary | ICD-10-CM | POA: Diagnosis not present

## 2021-01-05 DIAGNOSIS — I129 Hypertensive chronic kidney disease with stage 1 through stage 4 chronic kidney disease, or unspecified chronic kidney disease: Secondary | ICD-10-CM | POA: Diagnosis not present

## 2021-01-05 DIAGNOSIS — R55 Syncope and collapse: Secondary | ICD-10-CM | POA: Diagnosis not present

## 2021-01-05 DIAGNOSIS — J189 Pneumonia, unspecified organism: Secondary | ICD-10-CM | POA: Diagnosis not present

## 2021-01-05 DIAGNOSIS — M19011 Primary osteoarthritis, right shoulder: Secondary | ICD-10-CM | POA: Diagnosis not present

## 2021-01-05 DIAGNOSIS — N184 Chronic kidney disease, stage 4 (severe): Secondary | ICD-10-CM | POA: Insufficient documentation

## 2021-01-05 DIAGNOSIS — J811 Chronic pulmonary edema: Secondary | ICD-10-CM | POA: Diagnosis not present

## 2021-01-05 DIAGNOSIS — I4891 Unspecified atrial fibrillation: Secondary | ICD-10-CM | POA: Diagnosis not present

## 2021-01-05 DIAGNOSIS — I482 Chronic atrial fibrillation, unspecified: Secondary | ICD-10-CM | POA: Diagnosis not present

## 2021-01-05 DIAGNOSIS — R519 Headache, unspecified: Secondary | ICD-10-CM | POA: Diagnosis not present

## 2021-01-05 DIAGNOSIS — Z20822 Contact with and (suspected) exposure to covid-19: Secondary | ICD-10-CM | POA: Diagnosis not present

## 2021-01-05 DIAGNOSIS — I4821 Permanent atrial fibrillation: Secondary | ICD-10-CM | POA: Diagnosis not present

## 2021-01-05 DIAGNOSIS — N189 Chronic kidney disease, unspecified: Secondary | ICD-10-CM | POA: Diagnosis not present

## 2021-01-05 DIAGNOSIS — I495 Sick sinus syndrome: Secondary | ICD-10-CM | POA: Diagnosis not present

## 2021-01-05 DIAGNOSIS — J159 Unspecified bacterial pneumonia: Secondary | ICD-10-CM | POA: Diagnosis not present

## 2021-01-05 DIAGNOSIS — B9620 Unspecified Escherichia coli [E. coli] as the cause of diseases classified elsewhere: Secondary | ICD-10-CM | POA: Diagnosis not present

## 2021-01-05 DIAGNOSIS — I517 Cardiomegaly: Secondary | ICD-10-CM | POA: Diagnosis not present

## 2021-01-05 DIAGNOSIS — R531 Weakness: Secondary | ICD-10-CM | POA: Diagnosis not present

## 2021-01-05 DIAGNOSIS — R42 Dizziness and giddiness: Secondary | ICD-10-CM | POA: Diagnosis not present

## 2021-01-05 DIAGNOSIS — N1832 Chronic kidney disease, stage 3b: Secondary | ICD-10-CM | POA: Insufficient documentation

## 2021-01-05 DIAGNOSIS — L03114 Cellulitis of left upper limb: Secondary | ICD-10-CM | POA: Diagnosis not present

## 2021-01-05 DIAGNOSIS — Z950 Presence of cardiac pacemaker: Secondary | ICD-10-CM | POA: Diagnosis not present

## 2021-01-05 DIAGNOSIS — I5033 Acute on chronic diastolic (congestive) heart failure: Secondary | ICD-10-CM | POA: Diagnosis not present

## 2021-01-05 DIAGNOSIS — I130 Hypertensive heart and chronic kidney disease with heart failure and stage 1 through stage 4 chronic kidney disease, or unspecified chronic kidney disease: Secondary | ICD-10-CM | POA: Diagnosis not present

## 2021-01-05 DIAGNOSIS — M79601 Pain in right arm: Secondary | ICD-10-CM | POA: Diagnosis not present

## 2021-01-05 DIAGNOSIS — I272 Pulmonary hypertension, unspecified: Secondary | ICD-10-CM | POA: Diagnosis not present

## 2021-01-05 DIAGNOSIS — Z043 Encounter for examination and observation following other accident: Secondary | ICD-10-CM | POA: Diagnosis not present

## 2021-01-05 DIAGNOSIS — I4819 Other persistent atrial fibrillation: Secondary | ICD-10-CM | POA: Diagnosis not present

## 2021-01-05 DIAGNOSIS — I10 Essential (primary) hypertension: Secondary | ICD-10-CM | POA: Diagnosis not present

## 2021-01-05 DIAGNOSIS — N179 Acute kidney failure, unspecified: Secondary | ICD-10-CM | POA: Diagnosis not present

## 2021-01-05 HISTORY — DX: Urinary tract infection, site not specified: N39.0

## 2021-01-05 NOTE — Progress Notes (Addendum)
 Cardiology Consult Note [facility] 01/05/2021 PCP: Verify Patient Has Pcp Primary Cardiologist:  Consulting Cardiologist:Katelyn Earnie Sharps, MD  Active Problems:   Bradycardia   CKD (chronic kidney disease) stage 4, GFR 15-29 ml/min (HCC)   Fall   A-fib [facility])   Assessment & Plan:   Chronic Afib (years), on coumadin , bradycardia with pauses.  HR 80 on arrival. Telemetry has shown some pauses (up to 5 sec) which appear to be symptomatic -- shortness of breath, dizzy.    Rec:  Hold carvedilol  and monitor HR and BP. Use non-AV-nodal-blocking med for HTN.  Could continue her irbesartan  and monitor renal function.  Probably avoid amlodipine d/t her long-standing peripheral edema (lymphedema).  Could use hydralazine .  If she has sustained bradycardia, then use transcutaneous pacing, atropine.  I have talked with Dr. Linus and pt will likely need PPM.  Keep pt NPO in case we can do it tomorrow, but it may be Thursday.  Treat UTI.   Hold coumadin  and use lovenox  until after PPM.   K+ 3.9.  Will add on Mg++ level. Will check echo for LV function, LAE, valve disease, etc.  CKD.  Monitor renal function.  I think she has been on an ARB for years.  HTN.  D/C carvedilol  d/t bradycardia.  May need to use hydralazine  for HTN. Agree with treating UTI especially before placing PPM. Pt c/o pain in lower legs and requests Tramadol  which she takes at home and finds effective.     HPI     Katelyn Jackson is a 75 y.o. [place] female with a past medical history significant for Afib and CKD who presented to the ED ~7:15 this AM with a fall after getting up out of a chair ~2-2:30 AM.  Hx of numerous falls in the past, on warfarin, reported hitting her forehead, no LOC, c/o left-sided back/flank pain, dizziness, HA.  No CP, abd pain, SOB.  No urinary sx.    From Dr. Rogenia ED note:  Patient presents with a mechanical fall by EMS.  Patient has history of CKD and follows with nephrology.   Patient is on warfarin for atrial fibrillation.  Patient complains of headache head injury and left flank and rib pain.  Patient also seems to have a cellulitis versus erysipelas to her left upper extremity.  That arm is restricted for history of breast cancer.  Patient has a penicillin allergy which seems to be type I.  We will give clindamycin  for cellulitis.  Labs so far show CKD mildly elevated bilirubin alkaline phosphatase.  INR is 1.9.  CBC shows mild leukocytosis mild anemia no thrombocytopenia.  No previous labs to compare.  Will perform CT chest abdomen and pelvis because of the anticoagulation left-sided flank pain.  Because of the head injury and the patient's advanced age will perform CT cervical spine.  Will perform CT head to rule out head injury.  X-ray right humerus for proximal right humerus pain.  Head CT negative.  CT chest abdomen pelvis does not show any acute injuries but does show possible multifocal pneumonia.  COVID-19 and pro calcitonin as well as blood cultures were ordered.  Procalcitonin is negative for evidence of bacterial pneumonia.  COVID-19 testing and influenza testing negative.  Urinalysis does show evidence of UTI.  Dr. Somya has placed orders for levofloxacin.  This should cover bacterial pneumonia and of course will cover a urinary tract infection.  This could be responsible for the patient's mild leukocytosis.  Patient was  noted to become severely bradycardic with atrial fibrillation and become dizzy.  Patient's bradycardia with atrial fibrillation could be responsible for her presyncope syncope or falls.  Therefore I feel that patient should be admitted for her cellulitis, acute UTI, fall/presyncope/syncope, and atrial fibrillation with slow ventricular response.  I have discussed this case with the hospitalist Dr. Margueritte, who will admit this patient for further work-up and treatment. Results and plan of care also discussed with the patient.    Home medications  include carvedilol  6.25, irbesartan /HCTZ 150/12.5, lovastatin  20, KCl 8, Coumadin .  Labs: WBC 11.4, H GB 9.8, HCT 31.5, MCV 90.4, PLT 215. Sodium 133, potassium 3.9, chloride 102, CO2 23, BUN 20, creatinine 1.63, glucose 110, calcium 10.6, TP 7.6, ALB 3.3, TB 1.7, alk phos 243, AST 16, ALT 14, AG 8.  Troponin I 20, TSH 1.582, BNP 375.0. U/A + for blood, nitrite, leukocytes.   SARS-CoV-2 negative  Review of EPIC indicates she was seen in cardiology clinic [facility]) on 04/05/2016 by Dr. Annabella Jackson and/or Dr. Dorn Jackson to establish care.  She had chronic Afib and was on coumadin .  No hx of MI or CVA.  Chronic DOE and lymphedema.  267#.  3+ edema.  ECG showed Afib 94, ICLBBB.  164/90 on valsartan/HCTZ.    ECGs and Echoes under Care Everywhere - reports not available.  I saw Katelyn Jackson in ED 7 and she called her son and he translated (on speaker phone) for us .  He states she got up to go to the bathroom last night and when she was coming back to sit in a chair to rest, she fell.  She does not think she lost consciousness but cannot tell more about it.  She has had several falls recently.  She took her medications last night, none since.  No CP.  She has had some shortness of breath for the past several days.  She has her usual peripheral edema -- it makes her legs hurt and she is requesting Tramadol .  She had some pauses on the monitor when I was talking to her and was dizzy with them  The nurse reports that she is also short of breath when they occur. I discussed PPM with them and she would be willing to have pacer placed if needed.    Allergies  Allergen Reactions  . Penicillins Other (See Comments)         Medications:  Prior to Admission medications   Medication Sig Start Date End Date Taking? Authorizing Provider  allopurinoL  (ZYLOPRIM ) 100 MG tablet Take 50 mg by mouth daily.   Yes Historical Provider, MD  carvediloL  (COREG ) 6.25 MG tablet Take 6.25 mg by mouth 2 times  daily with meals.   Yes Historical Provider, MD  diclofenac  (VOLTAREN ) 1 % Gel gel Apply 2 g topically 4 (four) times daily as needed.   Yes Historical Provider, MD  exemestane  (AROMASIN ) 25 mg tablet Take 25 mg by mouth daily.   Yes Historical Provider, MD  furosemide  (LASIX ) 40 MG tablet Take 40 mg by mouth 2 times daily.   Yes Historical Provider, MD  irbesartan -hydrochlorothiazide  (AVALIDE) 150-12.5 mg per tablet Take 1 tablet by mouth daily.   Yes Historical Provider, MD  levothyroxine  (SYNTHROID ) 50 MCG tablet Take 50 mcg by mouth Daily at 0600.   Yes Historical Provider, MD  lovastatin  (MEVACOR ) 20 MG tablet Take 20 mg by mouth nightly.   Yes Historical Provider, MD  pancrelipase , Lip-Prot-Amyl, (CREON ) 12,000-38,000 -60,000 unit CpDR capsule  Take 12,000 units of lipase by mouth daily as needed.   Yes Historical Provider, MD  potassium chloride  (KLOR-CON  8) 8 MEQ CR tablet Take 8 mEq by mouth daily.   Yes Historical Provider, MD  traMADoL  (ULTRAM ) 50 mg tablet Take 50 mg by mouth every 12 (twelve) hours as needed.   Yes Historical Provider, MD  warfarin (COUMADIN ) 5 MG tablet *ANTICOAGULANT* Take 5 mg by mouth daily at 6pm.   Yes Historical Provider, MD    Past Medical History:  Diagnosis Date  . Atrial fibrillation (HCC)   . CKD (chronic kidney disease)     Past Surgical History:  Procedure Laterality Date  . APPENDECTOMY    . cataracts      Social History: Social History   Tobacco Use  Smoking Status Unknown If Ever Smoked  Smokeless Tobacco Not on file   Social History   Substance and Sexual Activity  Alcohol  Use Not Currently   Social History   Substance and Sexual Activity  Drug Use Not on file    History reviewed. No pertinent family history.  Code Status: No Order   Review of Systems Constitutional: no fever, chills, malaise, fatigue.  No weight gain or loss. HEENT:  No headaches, visual changes, hearing problems.  No sore throat. Respiratory: No  cough, respiratory distress.  No COPD, asthma.  No OSA. Cardiovascular:  No chest pain.  Some shortness of breath.  I did not ask about PND, orthopnea.  She has chronic lymphedema which makes her legs hurt - asking for Tramadol .  Some dizziness.  Questionable syncope. Gastrointestinal: No n/v/d, constipation.  No GERD.  No IBS. Genitourinary: No urinary problems, kidney stones, incontinence.  No urinary sx but appears to have UTI.   Skin:  No rashes.  Some bruising of arm and one on her left forehead from the fall.  Hematologic/lymphatic: No bleeding, anemia, adenopathy.  On coumadin . Musculoskeletal: No muscle pain, difficulty walking, arthritis.  + leg pain like when the weather changes Neurological: No numbness, tingling, motor weakness, sensory deficits.  Behavioral/Psych: No depression, anxiety. Endocrine: No thyroid  disease.  No DM.  No other endocrine disorders. Allergic/Immunologic: No seasonal allergies or immunologic deficits.   A complete review of systems was negative except for the problems as described above.   Physical Examination:   Vitals:   01/05/21 1145 01/05/21 1330 01/05/21 1337 01/05/21 1402  BP: 156/71 176/92    Pulse: 71 80 44 73  Temp:      Resp: (!) 28 (!) 28 19 (!) 28  SpO2:  97%    PainSc:      Weight:         Constitutional: WDWN, elderly, overweight female in NAD. HEENT: PERRL, EOMI.   Neck: Supple without adenopathy.  Unable to tell about JVD, thyromegaly.  Carotids exhibit normal upstrokes, no bruits. Respiratory: Respirations unlabored.  CTA bilaterally without rales, rhonchi, or wheezes.   Cardiovascular:  IRIR with distant heart sounds -- probably normal S1 and S2, no S3 or S4, no m/r/g audible. Abdominal: Soft, NT/ND, +BS, obese.  Extremities: FROM.  Radial pulses 2/2 bilaterally.  3-4+ LE edema. Neurologic: alert.  A&O x 4.  Motor strength 5/5 throughout.  No sensory deficits.  Memory, speech, balance (reclining on stretcher) appear normal.   Psych:  Affect appropriate. Cooperative Musculoskeletal:  No significant abnormalities.  Walks with a normal gait. Skin:  No rashes.  Bruising of left arm and left forehead.    Objective Data Reviewed During this Patient  Encounter:  Test Results  ECG 01/05/2021 7:35 AM: Atrial fibrillation with ventricular rate 80 bpm, LBBB.  No previous ECGs available.   Labs:  Recent Labs    01/05/21 0739  TROPONINI 20*   Recent Labs  Lab 01/05/21 0739  WBC 11.4*  RBC 3.48*  HGB 9.8*  HCT 31.5*  MCV 90.4  MCH 28.3  MCHC 31.3*  RDW 17.9  PLT 215  MPV 8.2  . Recent Labs  Lab 01/05/21 0739  NA 133*  K 3.9  CL 102  BUN 20  CREATININE 1.63*  GFR 33*   Lab Results  Component Value Date   INR 1.90 (H) 01/05/2021   BNP 375.0 (H) 01/05/2021   CT head and C-spine 01/05/2021: IMPRESSION: CT head: 1. No evidence of acute intracranial abnormality. 2. Mild generalized parenchymal atrophy and chronic small vessel ischemic disease. 3. Mild paranasal sinus mucosal thickening at the imaged levels.  CT cervical spine: 1. Mildly motion degraded exam. 2. No evidence of acute fracture to the cervical spine. 3. 2 mm C2-C3 grade 1 anterolisthesis. 4. Nonspecific straightening of the expected cervical lordosis. 5. Cervical levocurvature. 6. Patchy airspace opacities within the imaged lung apices. Please refer to the separately reported CT chest/abdomen/pelvis for further description.  CT chest, abdomen, pelvis 01/05/2021: IMPRESSION: -No acute intrathoracic, intra-abdominal or intrapelvic abnormalities. -Small BILATERAL pleural effusions. -Patchy infiltrates in both lungs question multifocal pneumonia, COVID-19 not excluded with this appearance. -Probable post therapy changes of the LEFT breast and axilla. -Severe degenerative changes of the RIGHT glenohumeral joint with bony debris and large joint effusion. -Small partially calcified uterine leiomyoma 2.1 cm diameter. -Aortic  Atherosclerosis (ICD10-I70.0).     01/05/2021, 3:44 PM   Electronically signed by: [name] Earnie Sharps, MD 01/05/21 [phone]    Electronically signed by: [name] Earnie Sharps, MD 01/05/21 [phone]

## 2021-01-06 ENCOUNTER — Ambulatory Visit: Payer: Medicare HMO | Admitting: Internal Medicine

## 2021-01-06 DIAGNOSIS — I4819 Other persistent atrial fibrillation: Secondary | ICD-10-CM | POA: Diagnosis not present

## 2021-01-06 DIAGNOSIS — L03114 Cellulitis of left upper limb: Secondary | ICD-10-CM | POA: Diagnosis not present

## 2021-01-06 DIAGNOSIS — Z950 Presence of cardiac pacemaker: Secondary | ICD-10-CM | POA: Diagnosis not present

## 2021-01-06 DIAGNOSIS — I4821 Permanent atrial fibrillation: Secondary | ICD-10-CM | POA: Diagnosis not present

## 2021-01-06 DIAGNOSIS — I081 Rheumatic disorders of both mitral and tricuspid valves: Secondary | ICD-10-CM | POA: Diagnosis not present

## 2021-01-06 DIAGNOSIS — I4891 Unspecified atrial fibrillation: Secondary | ICD-10-CM | POA: Diagnosis not present

## 2021-01-06 DIAGNOSIS — I495 Sick sinus syndrome: Secondary | ICD-10-CM | POA: Diagnosis not present

## 2021-01-06 DIAGNOSIS — J189 Pneumonia, unspecified organism: Secondary | ICD-10-CM | POA: Diagnosis not present

## 2021-01-06 DIAGNOSIS — I129 Hypertensive chronic kidney disease with stage 1 through stage 4 chronic kidney disease, or unspecified chronic kidney disease: Secondary | ICD-10-CM | POA: Diagnosis not present

## 2021-01-06 DIAGNOSIS — J811 Chronic pulmonary edema: Secondary | ICD-10-CM | POA: Diagnosis not present

## 2021-01-06 DIAGNOSIS — Z45018 Encounter for adjustment and management of other part of cardiac pacemaker: Secondary | ICD-10-CM | POA: Insufficient documentation

## 2021-01-06 DIAGNOSIS — N184 Chronic kidney disease, stage 4 (severe): Secondary | ICD-10-CM | POA: Diagnosis not present

## 2021-01-06 DIAGNOSIS — I272 Pulmonary hypertension, unspecified: Secondary | ICD-10-CM | POA: Diagnosis not present

## 2021-01-06 DIAGNOSIS — I517 Cardiomegaly: Secondary | ICD-10-CM | POA: Diagnosis not present

## 2021-01-06 DIAGNOSIS — M19011 Primary osteoarthritis, right shoulder: Secondary | ICD-10-CM | POA: Diagnosis not present

## 2021-01-07 DIAGNOSIS — R55 Syncope and collapse: Secondary | ICD-10-CM | POA: Diagnosis not present

## 2021-01-07 DIAGNOSIS — R001 Bradycardia, unspecified: Secondary | ICD-10-CM | POA: Diagnosis not present

## 2021-01-07 DIAGNOSIS — I517 Cardiomegaly: Secondary | ICD-10-CM | POA: Diagnosis not present

## 2021-01-07 DIAGNOSIS — I4891 Unspecified atrial fibrillation: Secondary | ICD-10-CM | POA: Diagnosis not present

## 2021-01-07 DIAGNOSIS — I5033 Acute on chronic diastolic (congestive) heart failure: Secondary | ICD-10-CM | POA: Diagnosis not present

## 2021-01-08 DIAGNOSIS — I482 Chronic atrial fibrillation, unspecified: Secondary | ICD-10-CM | POA: Diagnosis not present

## 2021-01-08 DIAGNOSIS — R55 Syncope and collapse: Secondary | ICD-10-CM | POA: Diagnosis not present

## 2021-01-08 DIAGNOSIS — I5033 Acute on chronic diastolic (congestive) heart failure: Secondary | ICD-10-CM | POA: Diagnosis not present

## 2021-01-08 DIAGNOSIS — R001 Bradycardia, unspecified: Secondary | ICD-10-CM | POA: Diagnosis not present

## 2021-01-09 DIAGNOSIS — R55 Syncope and collapse: Secondary | ICD-10-CM | POA: Diagnosis not present

## 2021-01-09 DIAGNOSIS — I482 Chronic atrial fibrillation, unspecified: Secondary | ICD-10-CM | POA: Diagnosis not present

## 2021-01-09 DIAGNOSIS — R001 Bradycardia, unspecified: Secondary | ICD-10-CM | POA: Diagnosis not present

## 2021-01-09 DIAGNOSIS — I5033 Acute on chronic diastolic (congestive) heart failure: Secondary | ICD-10-CM | POA: Diagnosis not present

## 2021-01-10 DIAGNOSIS — R001 Bradycardia, unspecified: Secondary | ICD-10-CM | POA: Diagnosis not present

## 2021-01-10 DIAGNOSIS — R55 Syncope and collapse: Secondary | ICD-10-CM | POA: Diagnosis not present

## 2021-01-10 DIAGNOSIS — I482 Chronic atrial fibrillation, unspecified: Secondary | ICD-10-CM | POA: Diagnosis not present

## 2021-01-10 DIAGNOSIS — I5033 Acute on chronic diastolic (congestive) heart failure: Secondary | ICD-10-CM | POA: Diagnosis not present

## 2021-01-11 DIAGNOSIS — R001 Bradycardia, unspecified: Secondary | ICD-10-CM | POA: Diagnosis not present

## 2021-01-12 DIAGNOSIS — R001 Bradycardia, unspecified: Secondary | ICD-10-CM | POA: Diagnosis not present

## 2021-01-12 DIAGNOSIS — N390 Urinary tract infection, site not specified: Secondary | ICD-10-CM | POA: Diagnosis not present

## 2021-01-12 DIAGNOSIS — N184 Chronic kidney disease, stage 4 (severe): Secondary | ICD-10-CM | POA: Diagnosis not present

## 2021-01-12 DIAGNOSIS — I10 Essential (primary) hypertension: Secondary | ICD-10-CM | POA: Diagnosis not present

## 2021-01-12 DIAGNOSIS — N189 Chronic kidney disease, unspecified: Secondary | ICD-10-CM | POA: Diagnosis not present

## 2021-01-12 DIAGNOSIS — I482 Chronic atrial fibrillation, unspecified: Secondary | ICD-10-CM | POA: Diagnosis not present

## 2021-01-12 DIAGNOSIS — N179 Acute kidney failure, unspecified: Secondary | ICD-10-CM | POA: Diagnosis not present

## 2021-01-13 DIAGNOSIS — N189 Chronic kidney disease, unspecified: Secondary | ICD-10-CM | POA: Diagnosis not present

## 2021-01-13 DIAGNOSIS — N184 Chronic kidney disease, stage 4 (severe): Secondary | ICD-10-CM | POA: Diagnosis not present

## 2021-01-13 DIAGNOSIS — I10 Essential (primary) hypertension: Secondary | ICD-10-CM | POA: Diagnosis not present

## 2021-01-13 DIAGNOSIS — R001 Bradycardia, unspecified: Secondary | ICD-10-CM | POA: Diagnosis not present

## 2021-01-13 DIAGNOSIS — N390 Urinary tract infection, site not specified: Secondary | ICD-10-CM | POA: Diagnosis not present

## 2021-01-13 DIAGNOSIS — I482 Chronic atrial fibrillation, unspecified: Secondary | ICD-10-CM | POA: Diagnosis not present

## 2021-01-13 DIAGNOSIS — N179 Acute kidney failure, unspecified: Secondary | ICD-10-CM | POA: Diagnosis not present

## 2021-01-14 DIAGNOSIS — I10 Essential (primary) hypertension: Secondary | ICD-10-CM | POA: Diagnosis not present

## 2021-01-14 DIAGNOSIS — N390 Urinary tract infection, site not specified: Secondary | ICD-10-CM | POA: Diagnosis not present

## 2021-01-14 DIAGNOSIS — R001 Bradycardia, unspecified: Secondary | ICD-10-CM | POA: Diagnosis not present

## 2021-01-14 DIAGNOSIS — N184 Chronic kidney disease, stage 4 (severe): Secondary | ICD-10-CM | POA: Diagnosis not present

## 2021-01-14 DIAGNOSIS — I4891 Unspecified atrial fibrillation: Secondary | ICD-10-CM | POA: Diagnosis not present

## 2021-01-14 NOTE — Discharge Summary (Signed)
 Medicine Discharge Summary  [facility] Hospitalist Discharge Summary   Patient ID: Katelyn Jackson [id number] 75 y.o. 03-09-1946   Admit date: 01/05/2021  Discharge date: 01/14/2021  Admitting Physician: [place] Otha Olives, MD Discharge Physician: Durwood MARLA Donning, MD  Discharge Diagnoses:  Principal Problem:   Bradycardia Active Problems:   CKD (chronic kidney disease) stage 4, GFR 15-29 ml/min (HCC)   Fall   A-fib [name]   UTI (urinary tract infection)   CAP (community acquired pneumonia)   Cellulitis   Cardiac pacemaker in situ Resolved Problems:   * No resolved hospital problems. [facility] Course:  75 y.o.femalewith PMHx as reviewed in the EMR with history of A. fib on anticoagulation with Coumadin , chronic kidney disease stage3-4 ,chronic lymphedema, H/Obreast cancerwas brought to emergency room post fall. Patient underwent extensive work-up in ED CT cervical spine and brain did not show any acute abnormality. She had CT chest abdomen and pelvis Patchy infiltrates in both lungs question multifocal pneumonia, COVID-19 not excluded with this appearance.COVID-19 was negative,she had mild leukocytosis creatinine was 1.63 .patient also had left forearm cellulitis she was given clindamycin  and hospitalist team consulted for further work-up and management.  Course  Patient admitted for syncope and fall with pauses. Cardiology was consulted. TTE: Left ventricular systolic function is low normal. LV ejection fraction = 50-55%. There is borderline concentric left ventricular hypertrophy. CT spine/brain without any acute abnormality. CT chest/abd/pelvis without any acute abnormality. Cardiology opted for patient to undergo pacemaker placement on 01/06/21. Cardiology also reported patient with fluid overload and further diuresis recommended.   Patient was on Lasix , HTCZ and Losartan. Unfortunately patient eventually developed AKI. Nephrology was consulted and assisted with  management. Nephrotoxic meds were discontinued. Eventually renal function improved. Cr at discharge was 1.89. Baseline Cr 1.4-1.6.   Patient was also treated for CAP, UTI and left arm cellulitis. She completed course of Levaquin and Doxycycline .   As result of her A-fib patient was on warfarin. INR became supratherapeutic and Vit K was given. Pharmacy assisted with management. Patient will be sent home with current home dose of warfarin and will follow up with PCP for further management.   Patient initially was suppose to go to SNF and SW had arranged for placement. Last minute, son decide patient will be going home instead. Patient was stable and deemed safe to discharge home.    Physical Exam Physical Exam General: Alert, oriented X 3, no apparent distress.  Head-Eyes-Ears-Nose-Throat: EOMI, pupils equal, round and reactive to light, oropharynx clear, no LAD.  Cardiovascular: Regular rate and rhythm, s1, s2, no murmurs. Pacemaker/ dressing in place.   Chest/Lungs: Bilateral wheezing  Abdomen: Positive bowel sounds. Abdomen soft, nondistended, nontender. No HSM, rebound or guarding.  Extremities: 2+ peripheral pulses  Skin: Diffuse non pitting pedal edema.   Other:      Discharged Condition: good Patient's Ordered Code Status: Full Code  Recommendations for Follow Up:  -follow up with cardiology. S/p pacemaker  -monitor warfarin levels  -monitor renal function      Consults:  Orders Placed This Encounter  Procedures  . Consult to Hospitalist (General)  . Consult To Cardiology  . Wound / Ostomy Eval And Treat  . Wound / Ostomy Eval And Treat  . Consult To Nephrology    Procedures:  No admission procedures for hospital encounter.  Recent Labs:     Lab Results  Component Value Date   WBC 5.8 01/13/2021   HGB 9.6 (L) 01/13/2021   HCT  29.0 (L) 01/13/2021   PLT 193 01/13/2021    Lab Results  Component Value Date   CO2 32 (H) 01/14/2021   BUN 28 (H) 01/14/2021    CREATININE 1.89 (H) 01/14/2021   CALCIUM 10.3 01/14/2021   ALBUMIN 3.3 (L) 01/05/2021   AST 16 01/05/2021   ALT 14 01/05/2021    Lab Results  Component Value Date   NA 134 (L) 01/14/2021   K 3.3 (L) 01/14/2021   CL 97 (L) 01/14/2021   CO2 32 (H) 01/14/2021   BUN 28 (H) 01/14/2021   CREATININE 1.89 (H) 01/14/2021   CALCIUM 10.3 01/14/2021   MG 2.0 01/13/2021   PHOS 2.1 (L) 01/06/2021    Lab Results  Component Value Date   ALKPHOS 243 (H) 01/05/2021   BILITOT 1.7 (H) 01/05/2021   PROT 7.6 01/05/2021   ALBUMIN 3.3 (L) 01/05/2021   ALT 14 01/05/2021   AST 16 01/05/2021    Lab Results  Component Value Date   INR 1.77 (H) 01/14/2021   Hospital Radiology CUS TTE SURFACE COMPLETE ECHO ADULT  Result Date: 01/06/2021                                [facility]                                Cardiac Ultrasound-                                 [place], [facility]                                [address]                                [place] [address]                   Transthoracic Echocardiogram Report Name: Katelyn Jackson, Katelyn Jackson             Study Date: 01/06/2021    Height: 64 in MRN: [id number]                         Patient [facility]: [facility] Weight: 235 lb DOB: 11/02/1946                      Gender: Female             BSA: 2.1 m2 Age: 52 yrs  Ethnicity: X               BP: 165/73 mmHg Reason For Study: Other (Enter in comments); Fall, initial encounter; Atrial fibrillation, unspecified type (HCC); Bradycardia; Atrial fibrillation with slow ventricular response HR: 81 (HCC History: Chronic A Fib, bradycardia Ordering Physician: MELDON, ALI      Performed By: MERRI BOOKBINDER Referring Physician: SELF, A REFERRAL - - PROCEDURE Image Quality: Technically adequate. Injection of agitated saline contrast performed  to evaluate for possible shunt. - SUMMARY Injection of agitated saline contrast performed to evaluate for possible shunt Left ventricular systolic function is low normal. LV ejection fraction = 50-55%. There is borderline concentric left ventricular hypertrophy. The left atrium is moderately to severely dilated. The right atrium is moderate to severely dilated. There is trace aortic regurgitation. There is moderate mitral regurgitation. There is moderate tricuspid regurgitation. Moderate pulmonary hypertension. There is no pericardial effusion. There is no comparison study available. - FINDINGS: LEFT VENTRICLE The left ventricle is mildly dilated. There is borderline concentric left ventricular hypertrophy. LV ejection fraction = 50-55%. Left ventricular systolic function is low normal. Left ventricular filling pattern is indeterminate. Unable to fully assess LV regional wall motion. - RIGHT VENTRICLE The right ventricle is borderline dilated. The right ventricular systolic function is borderline reduced. LEFT ATRIUM The left atrium is moderately to severely dilated. RIGHT ATRIUM The right atrium is moderate to severely dilated. A patent foramen ovale is present. Injection of agitated saline showed mild right-to- left shunt. - AORTIC VALVE The aortic valve is trileaflet. There is aortic valve sclerosis. There is trace aortic regurgitation. - MITRAL VALVE The mitral valve leaflets appear normal. There is moderate mitral regurgitation. The regurgitant jet is centrally-directed. - TRICUSPID VALVE Structurally normal tricuspid valve. There is moderate tricuspid regurgitation. Moderate pulmonary hypertension. Estimated right ventricular systolic pressure is 51 mmHg. - PULMONIC VALVE Structurally normal pulmonic valve. Trace to mild pulmonic valvular regurgitation. - - VENOUS The IVC is _ dialted with an abnormal collapsibility index, this suggestive of increased right atrial pressure. - EFFUSION There is no pericardial  effusion. - - MMode/2D Measurements & Calculations IVSd: 1.1 cm       LA dim: 5.7 cm     ESV(MOD-sp4):      asc Aorta LVIDd: 5.5 cm      EDV(MOD-sp4):      33.1 ml            Diam: 3.5 cm LVPWd: 1.2 cm      67.3 ml            EDV(MOD-sp2): LVIDs: 4.0 cm                         71.1 ml                                       ESV(MOD-sp2):                                       22.1 ml       _______________________________________________________________________ LVOT diam: 2.2 cm  SV(MOD-sp4):       EF A4C: 50.8 %     LA ESV (BP):                    34.2  ml                               244.0 ml                    SI(MOD-sp4):                    16.3 ml/m2       _______________________________________________________________________ LA ESV Index (A2C):LA ESV Index (A4C):LA ESV Index (BP): SV A4C: 121.1 ml/m2        109.1 ml/m2        116.7 ml/m2        34.2 ml Doppler Measurements & Calculations MV E max vel:     MV V2 max:       MV dec time: SV(LVOT): 94.7 ml 104.3 cm/sec      157.0 cm/sec     0.18 sec     Ao V2 max:                   MV max PG:                    217.0 cm/sec                   9.9 mmHg                      Ao max PG: 18.9 mmHg                   MV V2 mean:                   Ao V2 mean:                   79.2 cm/sec                   137.0 cm/sec                   MV mean PG:                   Ao mean PG: 9.0 mmHg                   3.0 mmHg                      Ao V2 VTI: 44.5 cm                   MV V2 VTI:                   36.6 cm                       AVA (VTI): 2.1 cm2                   MV area (1 diam):                   11.9 cm2                   MVA(VTI): 2.6 cm2       _______________________________________________________________________ LV V1 VTI:        TR max vel:      RAP systole: AS Dimensionless 24.9 cm  327.0 cm/sec     8.0 mmHg     Index (VTI): 0.56                   TR max PG:                   42.8 mmHg                   RVSP(TR):                   50.8 mmHg        _______________________________________________________________________ AVAi(VTI)         SV index(LVOT): cm^2/m^2: 1.0 cm2 45.2 ml/m2 _______________________________________________________________________ Reading Physician:                   MD Elspeth Kitten, [phone] 01/06/2021 01:34 PM   Electrophysiology Procedure  Result Date: 01/06/2021  Patient transferred to floor.  Patient in fair condition.  Patient is not to lift more than 10 lbs or lift arm above same side as device for 1 week.  Discussed results with family.  Status post dual-chamber [employer] pacemaker placement Operative Report Katelyn Jackson [id number] 01/06/2021 @PCP @ Electrophysiologist Electronically signed by: Hildegard Rinks, MD 01/06/2021 4:37 PM  Assistant: Electrophysiology Laboratory Staff. Procedure(s):   1.  Status post dual-chamber [employer] pacemaker with model number of ASSURITY MRI PM 2272 and serial number of [id number]. 2.  Both atrial and ventricular lead of [employer].  Atrial lead model number LPA 1200 /52 with serial number of [id number].  Ventricular lead of [employer] with model number of LPA 1200 M/58 and serial number of [id number]. 3.Tyrex Medtronic triple antibiotic envelope for prevention of infection with lot number of [id number]. Preoperative Diagnoses 75 year old [place] female with past medical history of long persistent atrial fibrillation on Coumadin , essential hypertension.  She was [facility] with recurrent episodes of dizziness and fall.  Her EKG and monitor was showing that she is in atrial fibrillation with tachybradycardia syndrome with long pauses up to 5 and half to 6 seconds.  and overnight also her heart rate was very low up to 20 bpm.  Consult was obtained consequently consult was requested from her cardiologist for pacemaker implant.  After discussion was held with patient and her son that she requires dual chamber pacemaker implant since she  cannot take medication for her atrial fibrillation.  And she is not a good candidate for pulmonary vein isolation.  After risks and benefits discussed with the patient and and her son they want to proceed with this procedure.  Risk including infection, bleeding and cardiac rupture and pneumothorax. Procedure Details The risks, benefits, complications, treatment options, and expected outcomes were discussed with the patient. The patient and/or family concurred with the proposed plan, giving informed consent. Patient was prepped and draped in the usual strict sterile fashion. After the antibiotic was completely infused, 1% Lidocaine  was used for local anesthesia the area 2 cm below left clavicle . using a #15 scalpel, an incision was made. The incision was extended to the prepectoral fascia using blunt dissection. The left axillary vein was cannulated under ultrasound guidance. A guide wire was advanced to the heart under fluoroscopic guidance. A sheath was advanced over the guidewire. The guidewire was retained, and the dilator was removed. A pacemaker lead was advanced to the heart under fluoroscopic guidance. The sheath was peeled away. The lead  was fixated to the right ventricular apex. Appropriate sensing and thresholds were obtained. No diaphragmatic pacing occurred at 10 V and 1.5 ms. A second sheath was advanced over the guidewire. The dilator and guidewire were removed. A pacemaker lead was advanced to the heart under fluoroscopic guidance. The sheath was peeled away. The lead was fixated to the right atrial appendage. Appropriate sensing and thresholds were obtained. No diaphragmatic pacing occurred at 10 V and 1.5 ms. The leads were then sutured to the pectoralis muscle using 3-0 silk suture. A second suture was placed around the lead sleeves. Hemostasis was achieved with two purse string sutures and Surgicel. Lead measurements were then rechecked. A left prepectoral pocket was fashioned. The leads were  attached to the generator. The system was placed in the pocket and an pulse generator was secured to pocket 3-0 silk suture. The pacemaker and leads system were visualized under fluoroscopy. Appropriate redundancy/slack in the leads were noted. The pins of the leads were beyond the set screws. Hemostasis was reverified. The pocket was then closed with 3 layers of Vicryl (2-0, 3-0, and subcuticular 4-0). The Steri-Strips, a gauze dressing, and an operative site were placed. Sponge count was correct. At the end of the procedure, the patient was hemodynamically is stable, and he was transferred to recovery in hemodynamically stable condition. Final Parameters: Pacemaker was programmed to VVIR with lower rate of 160 bpm and upper rate of 120 bpm. Measured Data: Threshold testing was performed.  Ventricular threshold was 0.5 V at 0.5 ms, impedance was 610 ohms and RV sensing was 12 mV.  Atrial threshold could not be performed since patient was in atrial fibrillation, P wave sensing was 0.4 mV and impedance was 310 ohms. Estimated Blood Loss:  Minimal Anesthesia: Anesthesiologist provided sedation for this patient, please refer to anesthesiologist note for detail of medication administered.      Complications:  None; patient tolerated the procedure well.        Disposition: Patient transferred to recovery hemodynamically stable condition.        Condition: stable   CT HEAD WO CONTRAST  Result Date: 01/05/2021 CLINICAL DATA:  Fall. Additional provided: Fall, hitting forehead. Patient denies loss of consciousness. On warfarin. Patient reports nausea, dizziness, headache. EXAM: CT HEAD WITHOUT CONTRAST CT CERVICAL SPINE WITHOUT CONTRAST TECHNIQUE: Multidetector CT imaging of the head and cervical spine was performed following the standard protocol without intravenous contrast. Multiplanar CT image reconstructions of the cervical spine were also generated. COMPARISON:  No pertinent prior exams available for comparison.  FINDINGS: CT HEAD FINDINGS Brain: Mild cerebral and cerebellar atrophy. Mild patchy hypoattenuation within the cerebral white matter is nonspecific, but compatible with chronic small vessel ischemic disease. There is no acute intracranial hemorrhage. No demarcated cortical infarct. No extra-axial fluid collection. No evidence of intracranial mass. No midline shift. Vascular: No hyperdense vessel.  Atherosclerotic calcifications. Skull: Normal. Negative for fracture or focal lesion. Sinuses/Orbits: Visualized orbits show no acute finding. Mild paranasal sinus mucosal thickening at the imaged levels, most notably ethmoidal. CT CERVICAL SPINE FINDINGS Mildly motion degraded exam. Alignment: Straightening of the expected cervical lordosis. Cervical levocurvature. 2 mm C2-C3 grade 1 anterolisthesis. Skull base and vertebrae: The basion-dental and atlanto-dental intervals are maintained.No evidence of acute fracture to the cervical spine. Soft tissues and spinal canal: No prevertebral fluid or swelling. No visible canal hematoma. Disc levels: Cervical spondylosis with multilevel disc space narrowing, disc bulges, uncovertebral hypertrophy and facet arthrosis. Disc space narrowing is severe at C3-C4, C4-C5,  C5-C6, C6-C7. Disc bulges and uncovertebral hypertrophy contribute to apparent moderate spinal canal stenosis at C3-C4, C4-C5, C5-C6 and C6-C7. Multilevel bony neural foraminal narrowing. Upper chest: Reported separately. Patchy opacity within the imaged lung apices. IMPRESSION: CT head: 1. No evidence of acute intracranial abnormality. 2. Mild generalized parenchymal atrophy and chronic small vessel ischemic disease. 3. Mild paranasal sinus mucosal thickening at the imaged levels. CT cervical spine: 1. Mildly motion degraded exam. 2. No evidence of acute fracture to the cervical spine. 3. 2 mm C2-C3 grade 1 anterolisthesis. 4. Nonspecific straightening of the expected cervical lordosis. 5. Cervical levocurvature. 6.  Patchy airspace opacities within the imaged lung apices. Please refer to the separately reported CT chest/abdomen/pelvis for further description. Electronically Signed   By: Rockey Childs DO   On: 01/05/2021 09:10   XR CHEST AP  PORTABLE  Result Date: 01/07/2021 CLINICAL DATA:  Cardiac pacemaker EXAM: PORTABLE CHEST 1 VIEW COMPARISON:  01/06/2021 FINDINGS: Left pacemaker remains in place with leads in the right atrium and right ventricle, unchanged. Cardiomegaly. Diffuse bilateral airspace disease. No effusions. No acute bony abnormality. No pneumothorax. IMPRESSION: Stable position of the left-sided pacemaker. Cardiomegaly.  Diffuse bilateral airspace disease, likely edema. Electronically Signed   By: Franky Crease M.D.   On: 01/07/2021 08:29   Portable X-ray of the chest  Result Date: 01/06/2021 CLINICAL DATA:  Status post pacemaker placement EXAM: PORTABLE CHEST 1 VIEW COMPARISON:  11/09/2017 FINDINGS: Cardiac shadow is enlarged. Pacing device is now seen. No pneumothorax is noted. Central vascular congestion with mild edema is noted consistent with CHF. No acute bony abnormality is seen. Degenerative changes of the right shoulder joint are noted. IMPRESSION: No pneumothorax following pacemaker placement. Vascular congestion and edema consistent with CHF. Electronically Signed   By: Oneil Devonshire M.D.   On: 01/06/2021 15:31   CT CHEST ABDOMEN PELVIS W CONTRAST (ROUTINE)  Result Date: 01/05/2021 CLINICAL DATA:  Fall, history breast cancer, on warfarin, struck forehead, denies loss of consciousness, complains of nausea, dizziness, headache, LEFT flank pain. Admits to 3 recent falls EXAM: CT CHEST, ABDOMEN, AND PELVIS WITH CONTRAST TECHNIQUE: Multidetector CT imaging of the chest, abdomen and pelvis was performed following the standard protocol during bolus administration of intravenous contrast. Sagittal and coronal MPR images reconstructed from axial data set. CONTRAST:  80 cc Omnipaque  350 IV.  No oral  contrast. COMPARISON:  None FINDINGS: CT CHEST FINDINGS Cardiovascular: Enlargement of cardiac chambers. Aorta normal caliber with minimal atherosclerotic calcification. No pericardial effusion. Tiny amount of air in the RIGHT atrium likely related to IV access. Central pulmonary arteries grossly patent. Mediastinum/Nodes: No mediastinal hemorrhage. Base of cervical region normal appearance. Esophagus unremarkable. Calcified subcarinal and RIGHT paratracheal lymph nodes consistent with old granulomatous disease. No thoracic adenopathy. Surgical clips LEFT axilla question prior lymph node resection. Skin thickening and increased trabeculation of the LEFT breast suspect related to treatment for breast cancer with a few surgical clips noted within LEFT breast. Lungs/Pleura: Small BILATERAL pleural effusions. Patchy infiltrates in both lungs question multifocal pneumonia. No pneumothorax. Musculoskeletal: Severe RIGHT glenohumeral degenerative changes with bony debris and large joint effusion. Osseous demineralization. No definite acute fractures. CT ABDOMEN PELVIS FINDINGS Hepatobiliary: Gallbladder and liver normal appearance Pancreas: Normal appearance Spleen: Normal appearance Adrenals/Urinary Tract: Adrenal glands, kidneys, and ureters normal appearance. Small amount of air within urinary bladder question prior catheterization. Stomach/Bowel: Stomach decompressed. Large and small bowel loops normal appearance. Appendix not visualized. Vascular/Lymphatic: Atherosclerotic calcifications aorta without aneurysm. Distended IVC which may be  related to fluid status or elevated RIGHT heart pressures. No adenopathy. Reproductive: Small partially calcified uterine leiomyoma 2.1 cm diameter. Unremarkable ovaries. Other: Minimal nonspecific stranding of presacral fat. No free air or free fluid. No hernia. Musculoskeletal: Degenerative disc disease changes of lower thoracic and lower lumbar spine. No fractures. IMPRESSION: No  acute intrathoracic, intra-abdominal or intrapelvic abnormalities. Small BILATERAL pleural effusions. Patchy infiltrates in both lungs question multifocal pneumonia, COVID-19 not excluded with this appearance. Probable post therapy changes of the LEFT breast and axilla. Severe degenerative changes of the RIGHT glenohumeral joint with bony debris and large joint effusion. Small partially calcified uterine leiomyoma 2.1 cm diameter. Aortic Atherosclerosis (ICD10-I70.0). Electronically Signed   By: Oneil Kiss M.D.   On: 01/05/2021 09:07   CT SPINE CERVICAL WO CONTRAST  Result Date: 01/05/2021 CLINICAL DATA:  Fall. Additional provided: Fall, hitting forehead. Patient denies loss of consciousness. On warfarin. Patient reports nausea, dizziness, headache. EXAM: CT HEAD WITHOUT CONTRAST CT CERVICAL SPINE WITHOUT CONTRAST TECHNIQUE: Multidetector CT imaging of the head and cervical spine was performed following the standard protocol without intravenous contrast. Multiplanar CT image reconstructions of the cervical spine were also generated. COMPARISON:  No pertinent prior exams available for comparison. FINDINGS: CT HEAD FINDINGS Brain: Mild cerebral and cerebellar atrophy. Mild patchy hypoattenuation within the cerebral white matter is nonspecific, but compatible with chronic small vessel ischemic disease. There is no acute intracranial hemorrhage. No demarcated cortical infarct. No extra-axial fluid collection. No evidence of intracranial mass. No midline shift. Vascular: No hyperdense vessel.  Atherosclerotic calcifications. Skull: Normal. Negative for fracture or focal lesion. Sinuses/Orbits: Visualized orbits show no acute finding. Mild paranasal sinus mucosal thickening at the imaged levels, most notably ethmoidal. CT CERVICAL SPINE FINDINGS Mildly motion degraded exam. Alignment: Straightening of the expected cervical lordosis. Cervical levocurvature. 2 mm C2-C3 grade 1 anterolisthesis. Skull base and vertebrae:  The basion-dental and atlanto-dental intervals are maintained.No evidence of acute fracture to the cervical spine. Soft tissues and spinal canal: No prevertebral fluid or swelling. No visible canal hematoma. Disc levels: Cervical spondylosis with multilevel disc space narrowing, disc bulges, uncovertebral hypertrophy and facet arthrosis. Disc space narrowing is severe at C3-C4, C4-C5, C5-C6, C6-C7. Disc bulges and uncovertebral hypertrophy contribute to apparent moderate spinal canal stenosis at C3-C4, C4-C5, C5-C6 and C6-C7. Multilevel bony neural foraminal narrowing. Upper chest: Reported separately. Patchy opacity within the imaged lung apices. IMPRESSION: CT head: 1. No evidence of acute intracranial abnormality. 2. Mild generalized parenchymal atrophy and chronic small vessel ischemic disease. 3. Mild paranasal sinus mucosal thickening at the imaged levels. CT cervical spine: 1. Mildly motion degraded exam. 2. No evidence of acute fracture to the cervical spine. 3. 2 mm C2-C3 grade 1 anterolisthesis. 4. Nonspecific straightening of the expected cervical lordosis. 5. Cervical levocurvature. 6. Patchy airspace opacities within the imaged lung apices. Please refer to the separately reported CT chest/abdomen/pelvis for further description. Electronically Signed   By: Rockey Childs DO   On: 01/05/2021 09:10   XR HUMERUS RIGHT - PORTABLE  Result Date: 01/05/2021 CLINICAL DATA:  Fall, right arm pain EXAM: RIGHT HUMERUS - 2+ VIEW COMPARISON:  None. FINDINGS: Advanced degenerative changes in the glenohumeral joint. Widening of the right [facility] joint felt to be related to prior distal clavicular resection or resorption. Deformity of the humeral head also noted which may be related to resorption/destruction. No visible acute fracture, subluxation or dislocation. IV noted in the antecubital fossa with a small amount of contrast material in the soft tissues,  likely small amount of contrast extravasation related to prior CT.  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 No visible acute fracture. Advanced degenerative changes in the right shoulder. Electronically Signed   By: Franky Crease M.D.   On: 01/05/2021 09:07     Disposition: Home   Discharge Medications: Current Discharge Medication List    CONTINUE these medications which have NOT CHANGED   Details  allopurinoL  (ZYLOPRIM ) 100 MG tablet Take 50 mg by mouth daily.    carvediloL  (COREG ) 6.25 MG tablet Take 6.25 mg by mouth 2 times daily with meals.    diclofenac  (VOLTAREN ) 1 % Gel gel Apply 2 g topically 4 (four) times daily as needed.    exemestane  (AROMASIN ) 25 mg tablet Take 25 mg by mouth daily.    levothyroxine  (SYNTHROID ) 50 MCG tablet Take 50 mcg by mouth Daily at 0600.    lovastatin  (MEVACOR ) 20 MG tablet Take 20 mg by mouth nightly.    pancrelipase , Lip-Prot-Amyl, (CREON ) 12,000-38,000 -60,000 unit CpDR capsule Take 12,000 units of lipase by mouth daily as needed.    potassium chloride  (KLOR-CON  8) 8 MEQ CR tablet Take 8 mEq by mouth daily.    traMADoL  (ULTRAM ) 50 mg tablet Take 50 mg by mouth every 12 (twelve) hours as needed.    warfarin (COUMADIN ) 5 MG tablet *ANTICOAGULANT* Take 5 mg by mouth daily at 6pm.      STOP taking these medications     furosemide  (LASIX ) 40 MG tablet      irbesartan -hydrochlorothiazide  (AVALIDE) 150-12.5 mg per tablet             Follow-up Appointments [facility] APPOINTMENTS    Appointment Date and Time Provider Department Dept Phone Address   01/21/2021 9:45 AM Morene Cyrus [place], PA-C Cardiology - [facility] B[facility] [phone] [place] [address]   04/16/2021 9:00 AM Laymon Nat Free, NP Cardiology - [facility] [phone] [address] [place] [address]        Electronically signed by: Durwood MARLA Donning, MD 01/14/2021 9:12  AM   Time spent on discharge: 35 min      Electronically signed by: Durwood MARLA Donning, MD 01/14/21 1213

## 2021-01-16 DIAGNOSIS — I495 Sick sinus syndrome: Secondary | ICD-10-CM | POA: Diagnosis not present

## 2021-01-16 DIAGNOSIS — Z48812 Encounter for surgical aftercare following surgery on the circulatory system: Secondary | ICD-10-CM | POA: Diagnosis not present

## 2021-01-16 DIAGNOSIS — I509 Heart failure, unspecified: Secondary | ICD-10-CM | POA: Diagnosis not present

## 2021-01-16 DIAGNOSIS — M5430 Sciatica, unspecified side: Secondary | ICD-10-CM | POA: Diagnosis not present

## 2021-01-16 DIAGNOSIS — D631 Anemia in chronic kidney disease: Secondary | ICD-10-CM | POA: Diagnosis not present

## 2021-01-16 DIAGNOSIS — I130 Hypertensive heart and chronic kidney disease with heart failure and stage 1 through stage 4 chronic kidney disease, or unspecified chronic kidney disease: Secondary | ICD-10-CM | POA: Diagnosis not present

## 2021-01-16 DIAGNOSIS — N184 Chronic kidney disease, stage 4 (severe): Secondary | ICD-10-CM | POA: Diagnosis not present

## 2021-01-16 DIAGNOSIS — I890 Lymphedema, not elsewhere classified: Secondary | ICD-10-CM | POA: Diagnosis not present

## 2021-01-16 DIAGNOSIS — I4811 Longstanding persistent atrial fibrillation: Secondary | ICD-10-CM | POA: Diagnosis not present

## 2021-01-18 ENCOUNTER — Telehealth: Payer: Self-pay | Admitting: Internal Medicine

## 2021-01-18 DIAGNOSIS — D631 Anemia in chronic kidney disease: Secondary | ICD-10-CM | POA: Diagnosis not present

## 2021-01-18 DIAGNOSIS — N184 Chronic kidney disease, stage 4 (severe): Secondary | ICD-10-CM | POA: Diagnosis not present

## 2021-01-18 DIAGNOSIS — Z48812 Encounter for surgical aftercare following surgery on the circulatory system: Secondary | ICD-10-CM | POA: Diagnosis not present

## 2021-01-18 DIAGNOSIS — I4811 Longstanding persistent atrial fibrillation: Secondary | ICD-10-CM | POA: Diagnosis not present

## 2021-01-18 DIAGNOSIS — I130 Hypertensive heart and chronic kidney disease with heart failure and stage 1 through stage 4 chronic kidney disease, or unspecified chronic kidney disease: Secondary | ICD-10-CM | POA: Diagnosis not present

## 2021-01-18 DIAGNOSIS — M5430 Sciatica, unspecified side: Secondary | ICD-10-CM | POA: Diagnosis not present

## 2021-01-18 DIAGNOSIS — I890 Lymphedema, not elsewhere classified: Secondary | ICD-10-CM | POA: Diagnosis not present

## 2021-01-18 DIAGNOSIS — I495 Sick sinus syndrome: Secondary | ICD-10-CM | POA: Diagnosis not present

## 2021-01-18 DIAGNOSIS — I509 Heart failure, unspecified: Secondary | ICD-10-CM | POA: Diagnosis not present

## 2021-01-18 NOTE — Telephone Encounter (Signed)
Katelyn Jackson is requesting PT verbals for 1w2, 2w2, 1w1 ,2w1, 1w2. He also said that there was a level 2 drug interaction for allopurinol (ZYLOPRIM) 100 MG tablet and warfarin (COUMADIN) 5 MG tablet. Please advise   Phone [phone]

## 2021-01-18 NOTE — Telephone Encounter (Signed)
called Roselie Awkward there was no answer LMOM w/Dr. Sharlet Salina response.Marland KitchenJohny Chess

## 2021-01-18 NOTE — Telephone Encounter (Signed)
MD is out of the office Pls advise on drug interaction.Marland KitchenAndee Poles

## 2021-01-18 NOTE — Telephone Encounter (Signed)
I do not see a qualifying visit within 3 months for verbals so would need to schedule visit with PCP within 30 days of start date and then verbals can be given. I do not see that either allopurinol or warfarin are new so okay to continue both.

## 2021-01-19 ENCOUNTER — Other Ambulatory Visit: Payer: Self-pay

## 2021-01-19 ENCOUNTER — Ambulatory Visit: Payer: Medicare HMO

## 2021-01-19 ENCOUNTER — Telehealth: Payer: Self-pay | Admitting: Internal Medicine

## 2021-01-19 ENCOUNTER — Ambulatory Visit (INDEPENDENT_AMBULATORY_CARE_PROVIDER_SITE_OTHER): Payer: Medicare HMO

## 2021-01-19 ENCOUNTER — Ambulatory Visit (INDEPENDENT_AMBULATORY_CARE_PROVIDER_SITE_OTHER): Payer: Medicare HMO | Admitting: Podiatry

## 2021-01-19 DIAGNOSIS — S92355A Nondisplaced fracture of fifth metatarsal bone, left foot, initial encounter for closed fracture: Secondary | ICD-10-CM

## 2021-01-19 DIAGNOSIS — S99922A Unspecified injury of left foot, initial encounter: Secondary | ICD-10-CM

## 2021-01-19 DIAGNOSIS — I509 Heart failure, unspecified: Secondary | ICD-10-CM | POA: Diagnosis not present

## 2021-01-19 DIAGNOSIS — B351 Tinea unguium: Secondary | ICD-10-CM

## 2021-01-19 DIAGNOSIS — I4811 Longstanding persistent atrial fibrillation: Secondary | ICD-10-CM | POA: Diagnosis not present

## 2021-01-19 DIAGNOSIS — I130 Hypertensive heart and chronic kidney disease with heart failure and stage 1 through stage 4 chronic kidney disease, or unspecified chronic kidney disease: Secondary | ICD-10-CM | POA: Diagnosis not present

## 2021-01-19 DIAGNOSIS — Z48812 Encounter for surgical aftercare following surgery on the circulatory system: Secondary | ICD-10-CM | POA: Diagnosis not present

## 2021-01-19 DIAGNOSIS — I495 Sick sinus syndrome: Secondary | ICD-10-CM | POA: Diagnosis not present

## 2021-01-19 DIAGNOSIS — D689 Coagulation defect, unspecified: Secondary | ICD-10-CM

## 2021-01-19 DIAGNOSIS — M5430 Sciatica, unspecified side: Secondary | ICD-10-CM | POA: Diagnosis not present

## 2021-01-19 DIAGNOSIS — D631 Anemia in chronic kidney disease: Secondary | ICD-10-CM | POA: Diagnosis not present

## 2021-01-19 DIAGNOSIS — S99921A Unspecified injury of right foot, initial encounter: Secondary | ICD-10-CM

## 2021-01-19 DIAGNOSIS — S92412A Displaced fracture of proximal phalanx of left great toe, initial encounter for closed fracture: Secondary | ICD-10-CM

## 2021-01-19 DIAGNOSIS — N184 Chronic kidney disease, stage 4 (severe): Secondary | ICD-10-CM | POA: Diagnosis not present

## 2021-01-19 DIAGNOSIS — I890 Lymphedema, not elsewhere classified: Secondary | ICD-10-CM | POA: Diagnosis not present

## 2021-01-19 NOTE — Telephone Encounter (Signed)
Jairo Ben is requesting verbals for nursing, 2w2, 1w2 and every other week for the remainder of the certification. Please advise    Phone: [phone]

## 2021-01-19 NOTE — Patient Instructions (Signed)
Home PT Patient has fractures to the 1st and 5th toes. She is to wear a protective shoe at all times. Her physical therapy may need to be adjusted because of this. Please contact with questions or concerns

## 2021-01-19 NOTE — Progress Notes (Signed)
  Subjective:  Patient ID: Katelyn Jackson, female    DOB: March 01, 1946,  MRN: [id number]  Chief Complaint  Patient presents with  . Foot Injury    Left foot injury due to fall 10 days ago. Painful with edema and bruising.   . Nail Problem    Nail trim requested.     75 y.o. female presents with the above complaint. History confirmed with patient. States she fell and hurt her left foot, worst on the outside but also 1st toe area. Was seen at hospital for this. On coumadin.  Objective:  Physical Exam: warm, good capillary refill, no trophic changes or ulcerative lesions, normal DP and PT pulses and normal sensory exam. Lymphedema BLE Left Foot: POP left 1st toe, 5th metatarsal head. Bruising over the 1st toe.  No images are attached to the encounter.  Radiographs: X-ray of the left foot: smal lavulsion fractue lateral 1st proximal phalanx, likely fracture 5th metatarsal head. Suboptimal XR views. Severe pes planus and forefoot abduction. Assessment:   1. Foot injury, left, initial encounter   2. Foot injury, right, initial encounter   3. Onychomycosis   4. Coagulation defect ([place])   5. Closed displaced fracture of proximal phalanx of left great toe, initial encounter   6. Closed nondisplaced fracture of fifth metatarsal bone of left foot, initial encounter      Plan:  Patient was evaluated and treated and all questions answered.  Fracture left 1st toe, 5th metatarsal -XR Reviewed with patient -Would benefit from trial of non-operative management for this injury. -Rx for surgical shoe  -Will restrict WB in PT  Onychomycosis with coag defect. -Nails debrided x10   Return in about 4 weeks (around 02/16/2021) for Fracture f/u with XRs.

## 2021-01-20 DIAGNOSIS — I509 Heart failure, unspecified: Secondary | ICD-10-CM | POA: Diagnosis not present

## 2021-01-20 DIAGNOSIS — M5430 Sciatica, unspecified side: Secondary | ICD-10-CM | POA: Diagnosis not present

## 2021-01-20 DIAGNOSIS — I495 Sick sinus syndrome: Secondary | ICD-10-CM | POA: Diagnosis not present

## 2021-01-20 DIAGNOSIS — D631 Anemia in chronic kidney disease: Secondary | ICD-10-CM | POA: Diagnosis not present

## 2021-01-20 DIAGNOSIS — N184 Chronic kidney disease, stage 4 (severe): Secondary | ICD-10-CM | POA: Diagnosis not present

## 2021-01-20 DIAGNOSIS — I130 Hypertensive heart and chronic kidney disease with heart failure and stage 1 through stage 4 chronic kidney disease, or unspecified chronic kidney disease: Secondary | ICD-10-CM | POA: Diagnosis not present

## 2021-01-20 DIAGNOSIS — I890 Lymphedema, not elsewhere classified: Secondary | ICD-10-CM | POA: Diagnosis not present

## 2021-01-20 DIAGNOSIS — I4811 Longstanding persistent atrial fibrillation: Secondary | ICD-10-CM | POA: Diagnosis not present

## 2021-01-20 DIAGNOSIS — Z48812 Encounter for surgical aftercare following surgery on the circulatory system: Secondary | ICD-10-CM | POA: Diagnosis not present

## 2021-01-20 NOTE — Telephone Encounter (Signed)
Notified Nira Conn pt has not seen MD since Dec. She will need qualifying visit within 3 months for verbals.Marland KitchenJohny Chess

## 2021-01-21 DIAGNOSIS — Z45018 Encounter for adjustment and management of other part of cardiac pacemaker: Secondary | ICD-10-CM | POA: Diagnosis not present

## 2021-01-21 DIAGNOSIS — Z950 Presence of cardiac pacemaker: Secondary | ICD-10-CM

## 2021-01-21 DIAGNOSIS — R001 Bradycardia, unspecified: Secondary | ICD-10-CM | POA: Diagnosis not present

## 2021-01-21 HISTORY — DX: Presence of cardiac pacemaker: Z95.0

## 2021-01-21 HISTORY — PX: INSERT / REPLACE / REMOVE PACEMAKER: SUR710

## 2021-01-22 DIAGNOSIS — I4811 Longstanding persistent atrial fibrillation: Secondary | ICD-10-CM | POA: Diagnosis not present

## 2021-01-22 DIAGNOSIS — I890 Lymphedema, not elsewhere classified: Secondary | ICD-10-CM | POA: Diagnosis not present

## 2021-01-22 DIAGNOSIS — I495 Sick sinus syndrome: Secondary | ICD-10-CM | POA: Diagnosis not present

## 2021-01-22 DIAGNOSIS — D631 Anemia in chronic kidney disease: Secondary | ICD-10-CM | POA: Diagnosis not present

## 2021-01-22 DIAGNOSIS — N184 Chronic kidney disease, stage 4 (severe): Secondary | ICD-10-CM | POA: Diagnosis not present

## 2021-01-22 DIAGNOSIS — M5430 Sciatica, unspecified side: Secondary | ICD-10-CM | POA: Diagnosis not present

## 2021-01-22 DIAGNOSIS — I130 Hypertensive heart and chronic kidney disease with heart failure and stage 1 through stage 4 chronic kidney disease, or unspecified chronic kidney disease: Secondary | ICD-10-CM | POA: Diagnosis not present

## 2021-01-22 DIAGNOSIS — I509 Heart failure, unspecified: Secondary | ICD-10-CM | POA: Diagnosis not present

## 2021-01-22 DIAGNOSIS — Z48812 Encounter for surgical aftercare following surgery on the circulatory system: Secondary | ICD-10-CM | POA: Diagnosis not present

## 2021-01-26 DIAGNOSIS — Z48812 Encounter for surgical aftercare following surgery on the circulatory system: Secondary | ICD-10-CM | POA: Diagnosis not present

## 2021-01-26 DIAGNOSIS — I495 Sick sinus syndrome: Secondary | ICD-10-CM | POA: Diagnosis not present

## 2021-01-26 DIAGNOSIS — D631 Anemia in chronic kidney disease: Secondary | ICD-10-CM | POA: Diagnosis not present

## 2021-01-26 DIAGNOSIS — I130 Hypertensive heart and chronic kidney disease with heart failure and stage 1 through stage 4 chronic kidney disease, or unspecified chronic kidney disease: Secondary | ICD-10-CM | POA: Diagnosis not present

## 2021-01-26 DIAGNOSIS — M5430 Sciatica, unspecified side: Secondary | ICD-10-CM | POA: Diagnosis not present

## 2021-01-26 DIAGNOSIS — I4811 Longstanding persistent atrial fibrillation: Secondary | ICD-10-CM | POA: Diagnosis not present

## 2021-01-26 DIAGNOSIS — I509 Heart failure, unspecified: Secondary | ICD-10-CM | POA: Diagnosis not present

## 2021-01-26 DIAGNOSIS — I890 Lymphedema, not elsewhere classified: Secondary | ICD-10-CM | POA: Diagnosis not present

## 2021-01-26 DIAGNOSIS — N184 Chronic kidney disease, stage 4 (severe): Secondary | ICD-10-CM | POA: Diagnosis not present

## 2021-01-29 DIAGNOSIS — I4811 Longstanding persistent atrial fibrillation: Secondary | ICD-10-CM | POA: Diagnosis not present

## 2021-01-29 DIAGNOSIS — D631 Anemia in chronic kidney disease: Secondary | ICD-10-CM | POA: Diagnosis not present

## 2021-01-29 DIAGNOSIS — N184 Chronic kidney disease, stage 4 (severe): Secondary | ICD-10-CM | POA: Diagnosis not present

## 2021-01-29 DIAGNOSIS — I890 Lymphedema, not elsewhere classified: Secondary | ICD-10-CM | POA: Diagnosis not present

## 2021-01-29 DIAGNOSIS — M5430 Sciatica, unspecified side: Secondary | ICD-10-CM | POA: Diagnosis not present

## 2021-01-29 DIAGNOSIS — I509 Heart failure, unspecified: Secondary | ICD-10-CM | POA: Diagnosis not present

## 2021-01-29 DIAGNOSIS — I495 Sick sinus syndrome: Secondary | ICD-10-CM | POA: Diagnosis not present

## 2021-01-29 DIAGNOSIS — Z48812 Encounter for surgical aftercare following surgery on the circulatory system: Secondary | ICD-10-CM | POA: Diagnosis not present

## 2021-01-29 DIAGNOSIS — I130 Hypertensive heart and chronic kidney disease with heart failure and stage 1 through stage 4 chronic kidney disease, or unspecified chronic kidney disease: Secondary | ICD-10-CM | POA: Diagnosis not present

## 2021-02-02 DIAGNOSIS — I130 Hypertensive heart and chronic kidney disease with heart failure and stage 1 through stage 4 chronic kidney disease, or unspecified chronic kidney disease: Secondary | ICD-10-CM | POA: Diagnosis not present

## 2021-02-02 DIAGNOSIS — I495 Sick sinus syndrome: Secondary | ICD-10-CM | POA: Diagnosis not present

## 2021-02-02 DIAGNOSIS — I10 Essential (primary) hypertension: Secondary | ICD-10-CM | POA: Diagnosis not present

## 2021-02-02 DIAGNOSIS — I509 Heart failure, unspecified: Secondary | ICD-10-CM | POA: Diagnosis not present

## 2021-02-02 DIAGNOSIS — N184 Chronic kidney disease, stage 4 (severe): Secondary | ICD-10-CM | POA: Diagnosis not present

## 2021-02-02 DIAGNOSIS — N390 Urinary tract infection, site not specified: Secondary | ICD-10-CM | POA: Diagnosis not present

## 2021-02-02 DIAGNOSIS — I890 Lymphedema, not elsewhere classified: Secondary | ICD-10-CM | POA: Diagnosis not present

## 2021-02-02 DIAGNOSIS — M5430 Sciatica, unspecified side: Secondary | ICD-10-CM | POA: Diagnosis not present

## 2021-02-02 DIAGNOSIS — D631 Anemia in chronic kidney disease: Secondary | ICD-10-CM | POA: Diagnosis not present

## 2021-02-02 DIAGNOSIS — I4811 Longstanding persistent atrial fibrillation: Secondary | ICD-10-CM | POA: Diagnosis not present

## 2021-02-02 DIAGNOSIS — Z48812 Encounter for surgical aftercare following surgery on the circulatory system: Secondary | ICD-10-CM | POA: Diagnosis not present

## 2021-02-04 DIAGNOSIS — I130 Hypertensive heart and chronic kidney disease with heart failure and stage 1 through stage 4 chronic kidney disease, or unspecified chronic kidney disease: Secondary | ICD-10-CM | POA: Diagnosis not present

## 2021-02-04 DIAGNOSIS — I509 Heart failure, unspecified: Secondary | ICD-10-CM | POA: Diagnosis not present

## 2021-02-04 DIAGNOSIS — I890 Lymphedema, not elsewhere classified: Secondary | ICD-10-CM | POA: Diagnosis not present

## 2021-02-04 DIAGNOSIS — M5430 Sciatica, unspecified side: Secondary | ICD-10-CM | POA: Diagnosis not present

## 2021-02-04 DIAGNOSIS — D631 Anemia in chronic kidney disease: Secondary | ICD-10-CM | POA: Diagnosis not present

## 2021-02-04 DIAGNOSIS — Z48812 Encounter for surgical aftercare following surgery on the circulatory system: Secondary | ICD-10-CM | POA: Diagnosis not present

## 2021-02-04 DIAGNOSIS — I495 Sick sinus syndrome: Secondary | ICD-10-CM | POA: Diagnosis not present

## 2021-02-04 DIAGNOSIS — I4811 Longstanding persistent atrial fibrillation: Secondary | ICD-10-CM | POA: Diagnosis not present

## 2021-02-04 DIAGNOSIS — N184 Chronic kidney disease, stage 4 (severe): Secondary | ICD-10-CM | POA: Diagnosis not present

## 2021-02-06 LAB — POCT INR: INR: 8 — AB (ref 2.0–3.0)

## 2021-02-08 ENCOUNTER — Ambulatory Visit (INDEPENDENT_AMBULATORY_CARE_PROVIDER_SITE_OTHER): Payer: Medicare HMO | Admitting: General Practice

## 2021-02-08 DIAGNOSIS — Z7901 Long term (current) use of anticoagulants: Secondary | ICD-10-CM | POA: Diagnosis not present

## 2021-02-08 DIAGNOSIS — I482 Chronic atrial fibrillation, unspecified: Secondary | ICD-10-CM | POA: Diagnosis not present

## 2021-02-08 NOTE — Patient Instructions (Signed)
Pre visit review using our clinic review tool, if applicable. No additional management support is needed unless otherwise documented below in the visit note.  Stop coumadin!  Pt checked INR on Saturday 4/2 and held coumadin 4/2 and 4/3.  Patient will hold dosage today 4/4 and will check INR tomorrow, 4/5.  Patient has appointment with Dr. [name] tomorrow as well.  Go to ER if you experience any unusual bleeding!  Call Villa Herb, RN with results as well as reporting to [place]. Dosing instructions have been given to patient's son, [name] and he did verbalize understanding.

## 2021-02-09 ENCOUNTER — Encounter: Payer: Self-pay | Admitting: Internal Medicine

## 2021-02-09 ENCOUNTER — Ambulatory Visit (INDEPENDENT_AMBULATORY_CARE_PROVIDER_SITE_OTHER): Payer: Medicare HMO | Admitting: General Practice

## 2021-02-09 ENCOUNTER — Other Ambulatory Visit: Payer: Self-pay

## 2021-02-09 ENCOUNTER — Ambulatory Visit (INDEPENDENT_AMBULATORY_CARE_PROVIDER_SITE_OTHER): Payer: Medicare HMO | Admitting: Internal Medicine

## 2021-02-09 ENCOUNTER — Other Ambulatory Visit: Payer: Self-pay | Admitting: General Practice

## 2021-02-09 DIAGNOSIS — E785 Hyperlipidemia, unspecified: Secondary | ICD-10-CM | POA: Diagnosis not present

## 2021-02-09 DIAGNOSIS — I10 Essential (primary) hypertension: Secondary | ICD-10-CM

## 2021-02-09 DIAGNOSIS — I890 Lymphedema, not elsewhere classified: Secondary | ICD-10-CM

## 2021-02-09 DIAGNOSIS — M5430 Sciatica, unspecified side: Secondary | ICD-10-CM | POA: Diagnosis not present

## 2021-02-09 DIAGNOSIS — I482 Chronic atrial fibrillation, unspecified: Secondary | ICD-10-CM

## 2021-02-09 DIAGNOSIS — R791 Abnormal coagulation profile: Secondary | ICD-10-CM

## 2021-02-09 DIAGNOSIS — Z7901 Long term (current) use of anticoagulants: Secondary | ICD-10-CM

## 2021-02-09 DIAGNOSIS — Z48812 Encounter for surgical aftercare following surgery on the circulatory system: Secondary | ICD-10-CM | POA: Diagnosis not present

## 2021-02-09 DIAGNOSIS — N184 Chronic kidney disease, stage 4 (severe): Secondary | ICD-10-CM

## 2021-02-09 DIAGNOSIS — I130 Hypertensive heart and chronic kidney disease with heart failure and stage 1 through stage 4 chronic kidney disease, or unspecified chronic kidney disease: Secondary | ICD-10-CM | POA: Diagnosis not present

## 2021-02-09 DIAGNOSIS — D631 Anemia in chronic kidney disease: Secondary | ICD-10-CM | POA: Diagnosis not present

## 2021-02-09 DIAGNOSIS — I4811 Longstanding persistent atrial fibrillation: Secondary | ICD-10-CM | POA: Diagnosis not present

## 2021-02-09 DIAGNOSIS — I495 Sick sinus syndrome: Secondary | ICD-10-CM | POA: Diagnosis not present

## 2021-02-09 DIAGNOSIS — I509 Heart failure, unspecified: Secondary | ICD-10-CM | POA: Diagnosis not present

## 2021-02-09 LAB — POCT INR: INR: 8 — AB (ref 2.0–3.0)

## 2021-02-09 MED ORDER — TRAMADOL HCL 50 MG PO TABS: 50.0000 mg | ORAL_TABLET | Freq: Two times a day (BID) | ORAL | 1 refills | Status: DC

## 2021-02-09 MED ORDER — LEVOTHYROXINE SODIUM 50 MCG PO TABS: 50.0000 ug | ORAL_TABLET | Freq: Every day | ORAL | 3 refills | Status: DC

## 2021-02-09 MED ORDER — PHYTONADIONE 5 MG PO TABS: ORAL_TABLET | ORAL | 0 refills | Status: DC

## 2021-02-09 NOTE — Assessment & Plan Note (Signed)
Cont w/SCD

## 2021-02-09 NOTE — Assessment & Plan Note (Signed)
Lovastatin 

## 2021-02-09 NOTE — Assessment & Plan Note (Signed)
Pt lost wt 

## 2021-02-09 NOTE — Assessment & Plan Note (Signed)
Furosemide OK per Dr Florene Glen Kdur Avalide HCT, Lasix

## 2021-02-09 NOTE — Patient Instructions (Addendum)
Pre visit review using our clinic review tool, if applicable. No additional management support is needed unless otherwise documented below in the visit note.  Stop coumadin!  Go to ER if you experience any unusual bleeding!  *Take 1/2 of vitamin K tablet ASAP! (Please pick up at [facility] on [address]  Call Villa Herb, RN with results as well as reporting to [place]. Dosing instructions have been given to patient's son, [name] and he did verbalize understanding.

## 2021-02-09 NOTE — Assessment & Plan Note (Signed)
Lasix, Avalide HCT, Coreg

## 2021-02-09 NOTE — Progress Notes (Signed)
Subjective:  Patient ID: Katelyn Jackson, female    DOB: Nov 21, 1945  Age: 75 y.o. MRN: [id number]  CC: Follow-up (ER follow-up- From [facility])   HPI Katelyn Jackson presents for edema, DM, breast cancer [name] was very sick - pacemaker; pt lost wt F/u hypothyroidism, pain  Outpatient Medications Prior to Visit  Medication Sig Dispense Refill  . allopurinol (ZYLOPRIM) 100 MG tablet Take 0.5 tablets (50 mg total) by mouth daily. 45 tablet 3  . Calcium-Magnesium-Vitamin D (CALCIUM 500 PO) Take 1 tablet by mouth daily.    . carvedilol (COREG) 6.25 MG tablet Take 1 tablet (6.25 mg total) by mouth 2 (two) times daily with a meal. Overdue for Annual appt must see provider for future refills 180 tablet 3  . Cholecalciferol (VITAMIN D3) 2000 units capsule Take 1 capsule (2,000 Units total) by mouth daily. 100 capsule 3  . clindamycin (CLEOCIN) 300 MG capsule Take 1 capsule (300 mg total) by mouth 3 (three) times daily. 21 capsule 0  . Cyanocobalamin (VITAMIN B-12 PO) Take 1 tablet by mouth daily.    . diclofenac Sodium (VOLTAREN) 1 % GEL APPLY 4 GRAMS FOUR TIMES DAILY AS NEEDED 100 g 5  . exemestane (AROMASIN) 25 MG tablet Take 1 tablet (25 mg total) by mouth daily after breakfast. 90 tablet 3  . ferrous sulfate 325 (65 FE) MG tablet Take 325 mg by mouth daily with breakfast.    . levothyroxine (SYNTHROID) 50 MCG tablet Take 1 tablet (50 mcg total) by mouth daily before breakfast. Overdue for Annual appt must see provider for future refills 90 tablet 3  . lipase/protease/amylase (CREON) 12000-38000 units CPEP capsule Take 1 capsule (12,000 Units total) by mouth daily as needed (stomach problems). 270 capsule 3  . lovastatin (MEVACOR) 20 MG tablet Take 1 tablet (20 mg total) by mouth daily. 90 tablet 3  . magnesium oxide (MAG-OX) 400 MG tablet Take 400 mg by mouth 2 (two) times daily.    . methocarbamol (ROBAXIN) 500 MG tablet Take 1 tablet (500 mg total) by mouth 2 (two) times  daily. 20 tablet 0  . omeprazole (PRILOSEC) 40 MG capsule Take 1 capsule (40 mg total) by mouth daily. Overdue for Annual appt must see provider for future refills 90 capsule 3  . phytonadione (MEPHYTON) 5 MG tablet Take 1/2 tablet (2.5 mg) by mouth once for 1 dose. 1 tablet 0  . polyethylene glycol powder (GLYCOLAX/MIRALAX) powder mix 1 capful (17 grams) IN 8 ounces OF liquid EVERY DAY 1581 g 3  . potassium chloride (KLOR-CON) 8 MEQ tablet Take 1 tablet (8 mEq total) by mouth daily. 90 tablet 3  . potassium chloride SA (KLOR-CON M20) 20 MEQ tablet Take 20 mEq by mouth daily.    [name] torsemide (DEMADEX) 20 MG tablet Take 20 mg by mouth daily.    . traMADol (ULTRAM) 50 MG tablet Take 1 tablet (50 mg total) by mouth every 12 (twelve) hours. 180 tablet 1  . triamcinolone ointment (KENALOG) 0.1 % Apply 1 application topically 2 (two) times daily. 450 g 2  . VOLTAREN 1 % GEL apply 4 grams 4 TIMES DAILY AS NEEDED FOR 30 DAYS 100 g 5  . warfarin (COUMADIN) 5 MG tablet Take 1 tablet daily except take 1/2 tablet on Wed and Sat or Take as directed by anticoagulation clinic 90 tablet 1  . furosemide (LASIX) 40 MG tablet TAKE 1 TABLET BY MOUTH DAILY AS NEEDED (Patient not taking: Reported on 02/09/2021) 90 tablet 1  .  irbesartan-hydrochlorothiazide (AVALIDE) 150-12.5 MG tablet Take 1 tablet by mouth daily. (Patient not taking: Reported on 02/09/2021) 90 tablet 3   No facility-administered medications prior to visit.    ROS: Review of Systems  Constitutional: Positive for fatigue. Negative for activity change, appetite change, chills and unexpected weight change.  HENT: Negative for congestion, mouth sores and sinus pressure.   Eyes: Negative for visual disturbance.  Respiratory: Negative for cough and chest tightness.   Cardiovascular: Positive for leg swelling.  Gastrointestinal: Negative for abdominal pain and nausea.  Genitourinary: Negative for difficulty urinating, frequency and vaginal pain.   Musculoskeletal: Positive for arthralgias, back pain and gait problem.  Skin: Negative for pallor and rash.  Neurological: Negative for dizziness, tremors, weakness, numbness and headaches.  Hematological: Bruises/bleeds easily.  Psychiatric/Behavioral: Negative for confusion, sleep disturbance and suicidal ideas.    Objective:  BP (!) 144/82 (BP Location: Left Arm)   Pulse 70   Temp 98.2 F (36.8 C) (Oral)   Ht 5\' 1"  (1.549 m)   Wt 250 lb 3.2 oz (113.5 kg)   LMP  (LMP Unknown)   SpO2 97%   BMI 47.27 kg/m   BP Readings from Last 3 Encounters:  02/09/21 (!) 144/82  12/22/20 (!) 134/58  10/08/20 132/80    Wt Readings from Last 3 Encounters:  02/09/21 250 lb 3.2 oz (113.5 kg)  12/22/20 272 lb 14.4 oz (123.8 kg)  10/08/20 282 lb (127.9 kg)    Physical Exam Constitutional:      General: She is not in acute distress.    Appearance: She is well-developed. She is obese. She is ill-appearing.  HENT:     Head: Normocephalic.     Right Ear: External ear normal.     Left Ear: External ear normal.     Nose: Nose normal.  Eyes:     General:        Right eye: No discharge.        Left eye: No discharge.     Conjunctiva/sclera: Conjunctivae normal.     Pupils: Pupils are equal, round, and reactive to light.  Neck:     Thyroid: No thyromegaly.     Vascular: No JVD.     Trachea: No tracheal deviation.  Cardiovascular:     Rate and Rhythm: Normal rate and regular rhythm.     Heart sounds: Normal heart sounds.  Pulmonary:     Effort: No respiratory distress.     Breath sounds: No stridor. No wheezing.  Abdominal:     General: Bowel sounds are normal. There is no distension.     Palpations: Abdomen is soft. There is no mass.     Tenderness: There is no abdominal tenderness. There is no guarding or rebound.  Musculoskeletal:        General: Swelling and tenderness present.     Cervical back: Normal range of motion and neck supple.     Right lower leg: Edema present.      Left lower leg: Edema present.  Lymphadenopathy:     Cervical: No cervical adenopathy.  Skin:    Findings: Bruising, lesion and rash present. No erythema.  Neurological:     Mental Status: She is oriented to person, place, and time.     Cranial Nerves: No cranial nerve deficit.     Motor: No abnormal muscle tone.     Coordination: Coordination abnormal.     Gait: Gait abnormal.     Deep Tendon Reflexes: Reflexes normal.  Psychiatric:  Behavior: Behavior normal.        Thought Content: Thought content normal.        Judgment: Judgment normal.    pressure sores In a w/c surg shoe L foot  Lab Results  Component Value Date   WBC 5.9 06/10/2020   HGB 9.5 (L) 06/10/2020   HCT 30.2 (L) 06/10/2020   PLT 281 06/10/2020   GLUCOSE 108 (H) 06/10/2020   ALT 33 06/10/2020   AST 24 06/10/2020   NA 137 06/10/2020   K 3.9 06/10/2020   CL 104 06/10/2020   CREATININE 2.09 (H) 06/10/2020   BUN 65 (H) 06/10/2020   CO2 20 (L) 06/10/2020   TSH 6.26 (H) 01/05/2018   INR 8.0 (A) 02/09/2021   HGBA1C 5.5 01/05/2018    No results found.  Assessment & Plan:    Walker Kehr, MD

## 2021-02-10 DIAGNOSIS — M5430 Sciatica, unspecified side: Secondary | ICD-10-CM | POA: Diagnosis not present

## 2021-02-10 DIAGNOSIS — I130 Hypertensive heart and chronic kidney disease with heart failure and stage 1 through stage 4 chronic kidney disease, or unspecified chronic kidney disease: Secondary | ICD-10-CM | POA: Diagnosis not present

## 2021-02-10 DIAGNOSIS — Z48812 Encounter for surgical aftercare following surgery on the circulatory system: Secondary | ICD-10-CM | POA: Diagnosis not present

## 2021-02-10 DIAGNOSIS — I890 Lymphedema, not elsewhere classified: Secondary | ICD-10-CM | POA: Diagnosis not present

## 2021-02-10 DIAGNOSIS — N184 Chronic kidney disease, stage 4 (severe): Secondary | ICD-10-CM | POA: Diagnosis not present

## 2021-02-10 DIAGNOSIS — D631 Anemia in chronic kidney disease: Secondary | ICD-10-CM | POA: Diagnosis not present

## 2021-02-10 DIAGNOSIS — I509 Heart failure, unspecified: Secondary | ICD-10-CM | POA: Diagnosis not present

## 2021-02-10 DIAGNOSIS — I495 Sick sinus syndrome: Secondary | ICD-10-CM | POA: Diagnosis not present

## 2021-02-10 DIAGNOSIS — I4811 Longstanding persistent atrial fibrillation: Secondary | ICD-10-CM | POA: Diagnosis not present

## 2021-02-11 ENCOUNTER — Ambulatory Visit (INDEPENDENT_AMBULATORY_CARE_PROVIDER_SITE_OTHER): Payer: Medicare HMO | Admitting: General Practice

## 2021-02-11 DIAGNOSIS — Z7901 Long term (current) use of anticoagulants: Secondary | ICD-10-CM | POA: Diagnosis not present

## 2021-02-11 DIAGNOSIS — I482 Chronic atrial fibrillation, unspecified: Secondary | ICD-10-CM

## 2021-02-11 LAB — POCT INR: INR: 1.7 — AB (ref 2.0–3.0)

## 2021-02-11 NOTE — Patient Instructions (Addendum)
Pre visit review using our clinic review tool, if applicable. No additional management support is needed unless otherwise documented below in the visit note.  Please take 1/2 tablet today and continue 1/2 tablet daily except 1 tablet on Mon Wed and Fridays.  Re-check in 1 week.    Call Villa Herb, RN with results as well as reporting to [place]. Dosing instructions have been given to patient's son, Katelyn Jackson and he did verbalize understanding.

## 2021-02-12 DIAGNOSIS — N184 Chronic kidney disease, stage 4 (severe): Secondary | ICD-10-CM | POA: Diagnosis not present

## 2021-02-12 DIAGNOSIS — I10 Essential (primary) hypertension: Secondary | ICD-10-CM | POA: Diagnosis not present

## 2021-02-14 DIAGNOSIS — Z950 Presence of cardiac pacemaker: Secondary | ICD-10-CM | POA: Diagnosis not present

## 2021-02-14 DIAGNOSIS — R531 Weakness: Secondary | ICD-10-CM | POA: Diagnosis not present

## 2021-02-16 ENCOUNTER — Ambulatory Visit (INDEPENDENT_AMBULATORY_CARE_PROVIDER_SITE_OTHER): Payer: Medicare HMO | Admitting: Podiatry

## 2021-02-16 ENCOUNTER — Ambulatory Visit (INDEPENDENT_AMBULATORY_CARE_PROVIDER_SITE_OTHER): Payer: Medicare HMO

## 2021-02-16 ENCOUNTER — Other Ambulatory Visit: Payer: Self-pay

## 2021-02-16 DIAGNOSIS — M19011 Primary osteoarthritis, right shoulder: Secondary | ICD-10-CM | POA: Diagnosis not present

## 2021-02-16 DIAGNOSIS — S92412A Displaced fracture of proximal phalanx of left great toe, initial encounter for closed fracture: Secondary | ICD-10-CM | POA: Diagnosis not present

## 2021-02-16 DIAGNOSIS — S92412D Displaced fracture of proximal phalanx of left great toe, subsequent encounter for fracture with routine healing: Secondary | ICD-10-CM | POA: Diagnosis not present

## 2021-02-16 DIAGNOSIS — M542 Cervicalgia: Secondary | ICD-10-CM | POA: Diagnosis not present

## 2021-02-17 ENCOUNTER — Ambulatory Visit
Admission: RE | Admit: 2021-02-17 | Discharge: 2021-02-17 | Disposition: A | Discharge status: Home / Self Care | Payer: Medicare HMO | Source: Ambulatory Visit | Attending: Sports Medicine | Admitting: Sports Medicine

## 2021-02-17 ENCOUNTER — Other Ambulatory Visit: Payer: Self-pay | Admitting: Sports Medicine

## 2021-02-17 DIAGNOSIS — I509 Heart failure, unspecified: Secondary | ICD-10-CM | POA: Diagnosis not present

## 2021-02-17 DIAGNOSIS — D631 Anemia in chronic kidney disease: Secondary | ICD-10-CM | POA: Diagnosis not present

## 2021-02-17 DIAGNOSIS — M5430 Sciatica, unspecified side: Secondary | ICD-10-CM | POA: Diagnosis not present

## 2021-02-17 DIAGNOSIS — M25511 Pain in right shoulder: Secondary | ICD-10-CM

## 2021-02-17 DIAGNOSIS — I130 Hypertensive heart and chronic kidney disease with heart failure and stage 1 through stage 4 chronic kidney disease, or unspecified chronic kidney disease: Secondary | ICD-10-CM | POA: Diagnosis not present

## 2021-02-17 DIAGNOSIS — I4811 Longstanding persistent atrial fibrillation: Secondary | ICD-10-CM | POA: Diagnosis not present

## 2021-02-17 DIAGNOSIS — S42121A Displaced fracture of acromial process, right shoulder, initial encounter for closed fracture: Secondary | ICD-10-CM | POA: Diagnosis not present

## 2021-02-17 DIAGNOSIS — N184 Chronic kidney disease, stage 4 (severe): Secondary | ICD-10-CM | POA: Diagnosis not present

## 2021-02-17 DIAGNOSIS — S46011A Strain of muscle(s) and tendon(s) of the rotator cuff of right shoulder, initial encounter: Secondary | ICD-10-CM | POA: Diagnosis not present

## 2021-02-17 DIAGNOSIS — R937 Abnormal findings on diagnostic imaging of other parts of musculoskeletal system: Secondary | ICD-10-CM | POA: Diagnosis not present

## 2021-02-17 DIAGNOSIS — M19011 Primary osteoarthritis, right shoulder: Secondary | ICD-10-CM | POA: Diagnosis not present

## 2021-02-17 DIAGNOSIS — Z853 Personal history of malignant neoplasm of breast: Secondary | ICD-10-CM | POA: Diagnosis not present

## 2021-02-17 DIAGNOSIS — I495 Sick sinus syndrome: Secondary | ICD-10-CM | POA: Diagnosis not present

## 2021-02-17 DIAGNOSIS — I890 Lymphedema, not elsewhere classified: Secondary | ICD-10-CM | POA: Diagnosis not present

## 2021-02-17 DIAGNOSIS — Z48812 Encounter for surgical aftercare following surgery on the circulatory system: Secondary | ICD-10-CM | POA: Diagnosis not present

## 2021-02-18 ENCOUNTER — Ambulatory Visit (INDEPENDENT_AMBULATORY_CARE_PROVIDER_SITE_OTHER): Payer: Medicare HMO | Admitting: General Practice

## 2021-02-18 ENCOUNTER — Telehealth: Payer: Self-pay | Admitting: Internal Medicine

## 2021-02-18 DIAGNOSIS — I482 Chronic atrial fibrillation, unspecified: Secondary | ICD-10-CM | POA: Diagnosis not present

## 2021-02-18 DIAGNOSIS — Z7901 Long term (current) use of anticoagulants: Secondary | ICD-10-CM

## 2021-02-18 DIAGNOSIS — I4821 Permanent atrial fibrillation: Secondary | ICD-10-CM | POA: Diagnosis not present

## 2021-02-18 LAB — POCT INR: INR: 4.9 — AB (ref 2.0–3.0)

## 2021-02-18 NOTE — Patient Instructions (Signed)
Pre visit review using our clinic review tool, if applicable. No additional management support is needed unless otherwise documented below in the visit note.  Please skip dosage today and tomorrow and take 1/2 tablet on Sat, Sun and Mon.  Re-check on Tuesday.      Call Villa Herb, RN with results as well as reporting to [place]. Dosing instructions have been given to patient's son, [name] and he did verbalize understanding.

## 2021-02-18 NOTE — Telephone Encounter (Signed)
Recv'd records from [facility] forwarded 2 pages to Dr. Cristie Hem Plotnikov 4/14/22fbg

## 2021-02-19 DIAGNOSIS — N184 Chronic kidney disease, stage 4 (severe): Secondary | ICD-10-CM | POA: Diagnosis not present

## 2021-02-19 DIAGNOSIS — M5430 Sciatica, unspecified side: Secondary | ICD-10-CM | POA: Diagnosis not present

## 2021-02-19 DIAGNOSIS — Z48812 Encounter for surgical aftercare following surgery on the circulatory system: Secondary | ICD-10-CM | POA: Diagnosis not present

## 2021-02-19 DIAGNOSIS — D631 Anemia in chronic kidney disease: Secondary | ICD-10-CM | POA: Diagnosis not present

## 2021-02-19 DIAGNOSIS — I890 Lymphedema, not elsewhere classified: Secondary | ICD-10-CM | POA: Diagnosis not present

## 2021-02-19 DIAGNOSIS — I130 Hypertensive heart and chronic kidney disease with heart failure and stage 1 through stage 4 chronic kidney disease, or unspecified chronic kidney disease: Secondary | ICD-10-CM | POA: Diagnosis not present

## 2021-02-19 DIAGNOSIS — I4811 Longstanding persistent atrial fibrillation: Secondary | ICD-10-CM | POA: Diagnosis not present

## 2021-02-19 DIAGNOSIS — I509 Heart failure, unspecified: Secondary | ICD-10-CM | POA: Diagnosis not present

## 2021-02-19 DIAGNOSIS — I495 Sick sinus syndrome: Secondary | ICD-10-CM | POA: Diagnosis not present

## 2021-02-23 ENCOUNTER — Other Ambulatory Visit: Payer: Self-pay

## 2021-02-23 ENCOUNTER — Ambulatory Visit
Admission: RE | Admit: 2021-02-23 | Discharge: 2021-02-23 | Disposition: A | Discharge status: Home / Self Care | Payer: Medicare HMO | Source: Ambulatory Visit | Attending: Sports Medicine | Admitting: Sports Medicine

## 2021-02-23 ENCOUNTER — Other Ambulatory Visit: Payer: Self-pay | Admitting: Sports Medicine

## 2021-02-23 ENCOUNTER — Ambulatory Visit (INDEPENDENT_AMBULATORY_CARE_PROVIDER_SITE_OTHER): Payer: Medicare HMO | Admitting: General Practice

## 2021-02-23 DIAGNOSIS — M25511 Pain in right shoulder: Secondary | ICD-10-CM | POA: Diagnosis not present

## 2021-02-23 DIAGNOSIS — D631 Anemia in chronic kidney disease: Secondary | ICD-10-CM | POA: Diagnosis not present

## 2021-02-23 DIAGNOSIS — G8929 Other chronic pain: Secondary | ICD-10-CM

## 2021-02-23 DIAGNOSIS — I4811 Longstanding persistent atrial fibrillation: Secondary | ICD-10-CM | POA: Diagnosis not present

## 2021-02-23 DIAGNOSIS — I482 Chronic atrial fibrillation, unspecified: Secondary | ICD-10-CM

## 2021-02-23 DIAGNOSIS — Z48812 Encounter for surgical aftercare following surgery on the circulatory system: Secondary | ICD-10-CM | POA: Diagnosis not present

## 2021-02-23 DIAGNOSIS — M5430 Sciatica, unspecified side: Secondary | ICD-10-CM | POA: Diagnosis not present

## 2021-02-23 DIAGNOSIS — I495 Sick sinus syndrome: Secondary | ICD-10-CM | POA: Diagnosis not present

## 2021-02-23 DIAGNOSIS — Z7901 Long term (current) use of anticoagulants: Secondary | ICD-10-CM

## 2021-02-23 DIAGNOSIS — M19011 Primary osteoarthritis, right shoulder: Secondary | ICD-10-CM | POA: Diagnosis not present

## 2021-02-23 DIAGNOSIS — I130 Hypertensive heart and chronic kidney disease with heart failure and stage 1 through stage 4 chronic kidney disease, or unspecified chronic kidney disease: Secondary | ICD-10-CM | POA: Diagnosis not present

## 2021-02-23 DIAGNOSIS — I509 Heart failure, unspecified: Secondary | ICD-10-CM | POA: Diagnosis not present

## 2021-02-23 DIAGNOSIS — N184 Chronic kidney disease, stage 4 (severe): Secondary | ICD-10-CM | POA: Diagnosis not present

## 2021-02-23 DIAGNOSIS — I890 Lymphedema, not elsewhere classified: Secondary | ICD-10-CM | POA: Diagnosis not present

## 2021-02-23 LAB — POCT INR: INR: 2.6 (ref 2.0–3.0)

## 2021-02-23 NOTE — Progress Notes (Signed)
Medical screening examination/treatment/procedure(s) were performed by non-physician practitioner and as supervising physician I was immediately available for consultation/collaboration. I agree with above. Cory Kitt, MD  

## 2021-02-23 NOTE — Progress Notes (Signed)
Medical screening examination/treatment/procedure(s) were performed by non-physician practitioner and as supervising physician I was immediately available for consultation/collaboration. I agree with above. Jazzlyn Huizenga, MD  

## 2021-02-23 NOTE — Patient Instructions (Signed)
Pre visit review using our clinic review tool, if applicable. No additional management support is needed unless otherwise documented below in the visit note.  Please change dosage and take 1/2 tablet daily.  Re-check in 1 week.        Call Villa Herb, RN with results as well as reporting to [place]. Dosing instructions have been given to patient's son, [name] and he did verbalize understanding.

## 2021-02-23 NOTE — Progress Notes (Signed)
Medical screening examination/treatment/procedure(s) were performed by non-physician practitioner and as supervising physician I was immediately available for consultation/collaboration. I agree with above. Gale Klar, MD  

## 2021-02-26 NOTE — Patient Instructions (Addendum)
DUE TO COVID-19 ONLY ONE VISITOR IS ALLOWED TO COME WITH YOU AND STAY IN THE WAITING ROOM ONLY DURING PRE OP AND PROCEDURE DAY OF SURGERY. THE 1 VISITOR  MAY VISIT WITH YOU AFTER SURGERY IN YOUR PRIVATE ROOM DURING VISITING HOURS ONLY!  YOU NEED TO HAVE A COVID 19 TEST ON_4/25______ @_______ , THIS TEST MUST BE DONE BEFORE SURGERY,  COVID TESTING SITE [address] [place] [id number], IT IS ON THE RIGHT GOING OUT [address] APPROXIMATELY  2 MINUTES PAST [employer] ON THE RIGHT. ONCE YOUR COVID TEST IS COMPLETED,  PLEASE BEGIN THE QUARANTINE INSTRUCTIONS AS OUTLINED IN YOUR HANDOUT.                Katelyn Jackson    Your procedure is scheduled on: 03/03/21   Report to [facility] Main  Entrance   Report to admitting at  7:30 AM     Call this number if you have problems the morning of surgery [phone]    Remember: Do not eat food or drink liquids :After Midnight.   BRUSH YOUR TEETH MORNING OF SURGERY AND RINSE YOUR MOUTH OUT, NO CHEWING GUM CANDY OR MINTS.     Take these medicines the morning of surgery with A SIP OF WATER: Aromasin, Carvedilol, Allopurinol, Levothyroxine, Omeprazole                                You may not have any metal on your body including hair pins and              piercings  Do not wear jewelry, make-up, lotions, powders or perfumes, deodorant             Do not wear nail polish on your fingernails.  Do not shave  48 hours prior to surgery.                Do not bring valuables to the hospital. [place].  Contacts, dentures or bridgework may not be worn into surgery.      Patients discharged the day of surgery will not be allowed to drive home.   IF YOU ARE HAVING SURGERY AND GOING HOME THE SAME DAY, YOU MUST HAVE AN ADULT TO DRIVE YOU HOME AND BE WITH YOU FOR 24 HOURS. OU MAY GO HOME BY TAXI OR UBER OR ORTHERWISE, BUT AN ADULT MUST ACCOMPANY YOU HOME AND STAY WITH  YOU FOR 24 HOURS.  Name and phone number of your driver:  Special Instructions: N/A              Please read over the following fact sheets you were given: _____________________________________________________________________             [facility]- Preparing for Total Shoulder Arthroplasty    Before surgery, you can play an important role. Because skin is not sterile, your skin needs to be as free of germs as possible. You can reduce the number of germs on your skin by using the following products. . Benzoyl Peroxide Gel o Reduces the number of germs present on the skin o Applied twice a day to shoulder area starting two days before surgery    ==================================================================  Please follow these instructions carefully:  BENZOYL PEROXIDE 5% GEL  Please do not use if you have an allergy to  benzoyl peroxide.   If your skin becomes reddened/irritated stop using the benzoyl peroxide.  Starting two days before surgery, apply as follows: 1. Apply benzoyl peroxide in the morning and at night. Apply after taking a shower. If you are not taking a shower clean entire shoulder front, back, and side along with the armpit with a clean wet washcloth.  2. Place a quarter-sized dollop on your shoulder and rub in thoroughly, making sure to cover the front, back, and side of your shoulder, along with the armpit.   2 days before ____ AM   ____ PM              1 day before ____ AM   ____ PM                         3. Do this twice a day for two days.  (Last application is the night before surgery, AFTER using the CHG soap as described below).  4. Do NOT apply benzoyl peroxide gel on the day of surgery.   [facility] - Preparing for Surgery Before surgery, you can play an important role.   Because skin is not sterile, your skin needs to be as free of germs as possible.   You can reduce the number of germs on your skin by washing with antibactorial soap before  surgery .   Please DO NOT use if you have an allergy to CHG or antibacterial soaps  inform your nurse when you arrive at Short Stay.  Do not shave (including legs and underarms) for at least 48 hours prior to the first CHG shower.  Please follow these instructions carefully:  1.  Shower with Soap the night before surgery and the  morning of Surgery.  2.  If you choose to wash your hair, wash your hair first as usual with your  normal  shampoo.  3.  After you shampoo, rinse your hair and body thoroughly to remove the  shampoo.                                          . .             6.  Wash thoroughly, paying special attention to the area where your surgery  will be performed.  7.  Thoroughly rinse your body with warm water from the neck down.               9.  Pat yourself dry with a clean towel.            10.  Wear clean pajamas.            11.  Place clean sheets on your bed the night of your first shower and do not  sleep with pets. Day of Surgery : Do not apply any lotions/deodorants the morning of surgery.  Please wear clean clothes to the hospital/surgery center.  FAILURE TO FOLLOW THESE INSTRUCTIONS MAY RESULT IN THE CANCELLATION OF YOUR SURGERY PATIENT SIGNATURE_________________________________  NURSE SIGNATURE__________________________________  ________________________________________________________________________

## 2021-03-01 ENCOUNTER — Other Ambulatory Visit: Payer: Self-pay

## 2021-03-01 ENCOUNTER — Encounter (HOSPITAL_COMMUNITY): Payer: Self-pay

## 2021-03-01 ENCOUNTER — Other Ambulatory Visit (HOSPITAL_COMMUNITY)
Admission: RE | Admit: 2021-03-01 | Discharge: 2021-03-01 | Disposition: A | Discharge status: Home / Self Care | Payer: Medicare HMO | Source: Ambulatory Visit | Attending: Orthopaedic Surgery | Admitting: Orthopaedic Surgery

## 2021-03-01 ENCOUNTER — Encounter (HOSPITAL_COMMUNITY)
Admission: RE | Admit: 2021-03-01 | Discharge: 2021-03-01 | Disposition: A | Discharge status: Home / Self Care | Payer: Medicare HMO | Source: Ambulatory Visit | Attending: Orthopaedic Surgery | Admitting: Orthopaedic Surgery

## 2021-03-01 DIAGNOSIS — I129 Hypertensive chronic kidney disease with stage 1 through stage 4 chronic kidney disease, or unspecified chronic kidney disease: Secondary | ICD-10-CM | POA: Diagnosis not present

## 2021-03-01 DIAGNOSIS — I482 Chronic atrial fibrillation, unspecified: Secondary | ICD-10-CM | POA: Diagnosis not present

## 2021-03-01 DIAGNOSIS — S42122A Displaced fracture of acromial process, left shoulder, initial encounter for closed fracture: Secondary | ICD-10-CM | POA: Insufficient documentation

## 2021-03-01 DIAGNOSIS — N184 Chronic kidney disease, stage 4 (severe): Secondary | ICD-10-CM | POA: Insufficient documentation

## 2021-03-01 DIAGNOSIS — Z950 Presence of cardiac pacemaker: Secondary | ICD-10-CM | POA: Insufficient documentation

## 2021-03-01 DIAGNOSIS — Z01818 Encounter for other preprocedural examination: Secondary | ICD-10-CM | POA: Diagnosis not present

## 2021-03-01 DIAGNOSIS — Z79899 Other long term (current) drug therapy: Secondary | ICD-10-CM | POA: Insufficient documentation

## 2021-03-01 DIAGNOSIS — Z20822 Contact with and (suspected) exposure to covid-19: Secondary | ICD-10-CM | POA: Insufficient documentation

## 2021-03-01 DIAGNOSIS — Y939 Activity, unspecified: Secondary | ICD-10-CM | POA: Insufficient documentation

## 2021-03-01 DIAGNOSIS — Y929 Unspecified place or not applicable: Secondary | ICD-10-CM | POA: Insufficient documentation

## 2021-03-01 DIAGNOSIS — M19011 Primary osteoarthritis, right shoulder: Secondary | ICD-10-CM | POA: Insufficient documentation

## 2021-03-01 DIAGNOSIS — Z7901 Long term (current) use of anticoagulants: Secondary | ICD-10-CM | POA: Insufficient documentation

## 2021-03-01 DIAGNOSIS — Z01812 Encounter for preprocedural laboratory examination: Secondary | ICD-10-CM | POA: Insufficient documentation

## 2021-03-01 DIAGNOSIS — K219 Gastro-esophageal reflux disease without esophagitis: Secondary | ICD-10-CM | POA: Diagnosis not present

## 2021-03-01 LAB — BASIC METABOLIC PANEL
Anion gap: 9 (ref 5–15)
BUN: 16 mg/dL (ref 8–23)
CO2: 25 mmol/L (ref 22–32)
Calcium: 9.7 mg/dL (ref 8.9–10.3)
Chloride: 101 mmol/L (ref 98–111)
Creatinine, Ser: 1.46 mg/dL — ABNORMAL HIGH (ref 0.44–1.00)
GFR, Estimated: 37 mL/min — ABNORMAL LOW (ref >60)
Glucose, Bld: 94 mg/dL (ref 70–99)
Potassium: 3.9 mmol/L (ref 3.5–5.1)
Sodium: 135 mmol/L (ref 135–145)

## 2021-03-01 LAB — CBC
HCT: 32.1 % — ABNORMAL LOW (ref 36.0–46.0)
Hemoglobin: 9.1 g/dL — ABNORMAL LOW (ref 12.0–15.0)
MCH: 27 pg (ref 26.0–34.0)
MCHC: 28.3 g/dL — ABNORMAL LOW (ref 30.0–36.0)
MCV: 95.3 fL (ref 80.0–100.0)
Platelets: 224 10*3/uL (ref 150–400)
RBC: 3.37 MIL/uL — ABNORMAL LOW (ref 3.87–5.11)
RDW: 16.4 % — ABNORMAL HIGH (ref 11.5–15.5)
WBC: 4.8 10*3/uL (ref 4.0–10.5)
nRBC: 0 % (ref 0.0–0.2)

## 2021-03-01 LAB — SURGICAL PCR SCREEN
MRSA, PCR: NEGATIVE
Staphylococcus aureus: POSITIVE — AB

## 2021-03-01 NOTE — Progress Notes (Addendum)
COVID Vaccine Completed:No Date COVID Vaccine completed: COVID vaccine manufacturer: Pfizer    Moderna   [employer]   PCP - Dr. Loni Muse Plotnikov Cardiologist - Dr. Dyke Maes  Chest x-ray -  EKG - 03/01/21-chart Stress Test - no ECHO - no Cardiac Cath - no Pacemaker/ICD device last checked:02/21/21  Sleep Study - yes CPAP - no  Fasting Blood Sugar - NA Checks Blood Sugar _____ times a day  Blood Thinner Instructions:Coumadin. Pt will see the Dr. on 26th and instructions will be called to the pt prior to DOS Aspirin Instructions: Last Dose:  Anesthesia review:   Patient denies shortness of breath, fever, cough and chest pain at PAT appointment Yes. Pt uses a walker at home moves slowly. She needs some assistance with ADLs.  Patient verbalized understanding of instructions that were given to them at the PAT appointment. Patient was also instructed that they will need to review over the PAT instructions again at home before surgery.yes  She had a pacemaker placed 01/21/21.

## 2021-03-02 ENCOUNTER — Ambulatory Visit: Payer: Self-pay | Admitting: General Practice

## 2021-03-02 ENCOUNTER — Encounter (HOSPITAL_COMMUNITY): Payer: Self-pay | Admitting: Certified Registered Nurse Anesthetist

## 2021-03-02 DIAGNOSIS — Z7901 Long term (current) use of anticoagulants: Secondary | ICD-10-CM

## 2021-03-02 DIAGNOSIS — Z48812 Encounter for surgical aftercare following surgery on the circulatory system: Secondary | ICD-10-CM | POA: Diagnosis not present

## 2021-03-02 DIAGNOSIS — I130 Hypertensive heart and chronic kidney disease with heart failure and stage 1 through stage 4 chronic kidney disease, or unspecified chronic kidney disease: Secondary | ICD-10-CM | POA: Diagnosis not present

## 2021-03-02 DIAGNOSIS — I509 Heart failure, unspecified: Secondary | ICD-10-CM | POA: Diagnosis not present

## 2021-03-02 DIAGNOSIS — I890 Lymphedema, not elsewhere classified: Secondary | ICD-10-CM | POA: Diagnosis not present

## 2021-03-02 DIAGNOSIS — I495 Sick sinus syndrome: Secondary | ICD-10-CM | POA: Diagnosis not present

## 2021-03-02 DIAGNOSIS — N184 Chronic kidney disease, stage 4 (severe): Secondary | ICD-10-CM | POA: Diagnosis not present

## 2021-03-02 DIAGNOSIS — M5430 Sciatica, unspecified side: Secondary | ICD-10-CM | POA: Diagnosis not present

## 2021-03-02 DIAGNOSIS — I4811 Longstanding persistent atrial fibrillation: Secondary | ICD-10-CM | POA: Diagnosis not present

## 2021-03-02 DIAGNOSIS — D631 Anemia in chronic kidney disease: Secondary | ICD-10-CM | POA: Diagnosis not present

## 2021-03-02 DIAGNOSIS — I482 Chronic atrial fibrillation, unspecified: Secondary | ICD-10-CM

## 2021-03-02 LAB — SARS CORONAVIRUS 2 (TAT 6-24 HRS): SARS Coronavirus 2: NEGATIVE

## 2021-03-02 LAB — POCT INR: INR: 3.1 — AB (ref 2.0–3.0)

## 2021-03-02 NOTE — Progress Notes (Signed)
Anesthesia Chart Review   Case: [id number] Date/Time: 03/03/21 0715   Procedure: TOTAL SHOULDER ARTHROPLASTY (Right Shoulder)   Anesthesia type: Choice   Pre-op diagnosis: displace acromion fracture, humeral head collapse with severe glenohumeral arthritis.   Location: [place] / [facility]   Surgeons: Hiram Gash, MD      DISCUSSION:75 y.o. never smoker with h/o HTN, GERD, a-fib (on Coumadin), pacemaker in place due to bradycardia (placed 01/06/21, last device check 4/17, device orders requested on 4/26), chronic lymphedema, CKD Stage IV, displaced acromion fracture, humeral head collapse with severe OA scheduled for above procedure 03/03/2021 with Dr. Ophelia Charter.   S/p pacemaker placement 01/06/21. Per anesthesia note mallampati III, ASA 4, limited neck ROM due to thick neck and short neck, poor dentition. No anesthesia complications noted.   Pt last seen by PCP 02/11/21. Clearance received and on chart.   Pt given anticoagulation instructions 4/26.  Advised to hold 4/26, which is one day prior to surgery. Hemoglobin 9.1 at PAT visit 03/01/2021. INR 3.1.  Case will be postponed at this time.  VS: BP (!) 189/89   Pulse (!) 55   Temp 36.9 C (Oral)   Resp 20   Ht 5\' 1"  (1.549 m)   Wt 114.8 kg   LMP  (LMP Unknown)   SpO2 99%   BMI 47.80 kg/m   PROVIDERS: Plotnikov, [name], MD is PCP   Mahala Menghini, MD is Cardiologist  LABS: labs forwarded to surgeon (all labs ordered are listed, but only abnormal results are displayed)  Labs Reviewed  SURGICAL PCR SCREEN - Abnormal; Notable for the following components:      Result Value   Staphylococcus aureus POSITIVE (*)    All other components within normal limits  BASIC METABOLIC PANEL - Abnormal; Notable for the following components:   Creatinine, Ser 1.46 (*)    GFR, Estimated 37 (*)    All other components within normal limits  CBC - Abnormal; Notable for the following components:   RBC 3.37 (*)    Hemoglobin 9.1 (*)    HCT 32.1 (*)     MCHC 28.3 (*)    RDW 16.4 (*)    All other components within normal limits     IMAGES:   EKG: 03/01/2021 Rate 66 bpm  Atrial fibrillation Left axis deviation Left bundle branch block  CV: Echo 05/14/2015 LA Vol. 53 mL/m2 The left atrium is severely dilated The right atrium is moderately dilated There is mild mitral annular calcification  There is trace mitral regurgitation  There is mild tricuspid regurgitation Mild to moderate pulmonary hypertension  RVSP 46 mmHg There is mild aortic valve sclerosis Left ventricular systolic function is normal  Ejection Fraction = 55-60% Past Medical History:  Diagnosis Date  . Arthritis   . Cancer [facility])    breast cancer  . Chronic atrial fibrillation ([place])   . Chronic kidney disease    sees Dr Florene Glen  . Cramps, muscle, general   . Dyspnea on exertion   . Dysrhythmia    a-fib,   . GERD (gastroesophageal reflux disease)   . Headache   . Hyperlipidemia   . Hypertension   . Hypothyroidism   . Lymphedema   . Moderate to severe pulmonary hypertension ([place])   . Obesity   . Personal history of radiation therapy   . Pneumonia 12/2020  . Presence of permanent cardiac pacemaker 01/21/2021   for bradycardia   . Syncope 12/2020   needed a pacemaker  Past Surgical History:  Procedure Laterality Date  . APPENDECTOMY    . BREAST BIOPSY Left 2018  . BREAST LUMPECTOMY Left 04/04/2017   x3  . BREAST LUMPECTOMY WITH NEEDLE LOCALIZATION AND AXILLARY SENTINEL LYMPH NODE BX Left 04/04/2017   Procedure: BREAST LUMPECTOMY WITH NEEDLE LOCALIZATION x3 AND AXILLARY SENTINEL LYMPH NODE BX;  Surgeon: [name], MD;  Location: [place];  Service: General;  Laterality: Left;  . CATARACT EXTRACTION W/ INTRAOCULAR LENS  IMPLANT, BILATERAL Bilateral 2018  . COLONOSCOPY    . EYE SURGERY    . GLAUCOMA SURGERY Bilateral 2018  . INSERT / REPLACE / REMOVE PACEMAKER  01/21/2021  . RE-EXCISION OF BREAST CANCER,SUPERIOR MARGINS Left 04/26/2017    Procedure: RE-EXCISION OF LEFT BREAST CANCER;  Surgeon: [name], MD;  Location: [place];  Service: General;  Laterality: Left;    MEDICATIONS: . allopurinol (ZYLOPRIM) 100 MG tablet  . carvedilol (COREG) 6.25 MG tablet  . Cholecalciferol (VITAMIN D3) 2000 units capsule  . Cyanocobalamin (VITAMIN B-12 PO)  . diclofenac Sodium (VOLTAREN) 1 % GEL  . exemestane (AROMASIN) 25 MG tablet  . ferrous sulfate 325 (65 FE) MG tablet  . levothyroxine (SYNTHROID) 50 MCG tablet  . lipase/protease/amylase (CREON) 12000-38000 units CPEP capsule  . lovastatin (MEVACOR) 20 MG tablet  . magnesium oxide (MAG-OX) 400 MG tablet  . methocarbamol (ROBAXIN) 500 MG tablet  . omeprazole (PRILOSEC) 40 MG capsule  . OVER THE COUNTER MEDICATION  . polyethylene glycol powder (GLYCOLAX/MIRALAX) powder  . potassium chloride (KLOR-CON) 8 MEQ tablet  . torsemide (DEMADEX) 20 MG tablet  . traMADol (ULTRAM) 50 MG tablet  . warfarin (COUMADIN) 5 MG tablet   No current facility-administered medications for this encounter.

## 2021-03-02 NOTE — Patient Instructions (Addendum)
Pre visit review using our clinic review tool, if applicable. No additional management support is needed unless otherwise documented below in the visit note.  Hold dosage today and then take 1/2 tablet daily.  Re-check in 1 week.          Call Villa Herb, RN with results as well as reporting to [place]. Dosing instructions have been given to patient's son, [name] and he did verbalize understanding.  Shoulder surgery rescheduled for 5/11.

## 2021-03-03 ENCOUNTER — Ambulatory Visit (HOSPITAL_COMMUNITY): Admission: RE | Admit: 2021-03-03 | Payer: Medicare HMO | Source: Home / Self Care | Admitting: Orthopaedic Surgery

## 2021-03-03 ENCOUNTER — Encounter (HOSPITAL_COMMUNITY): Admission: RE | Payer: Self-pay | Source: Home / Self Care

## 2021-03-03 SURGERY — ARTHROPLASTY, SHOULDER, TOTAL
Anesthesia: Choice | Site: Shoulder | Laterality: Right

## 2021-03-09 ENCOUNTER — Ambulatory Visit: Payer: Self-pay | Admitting: General Practice

## 2021-03-09 ENCOUNTER — Telehealth: Payer: Self-pay | Admitting: Internal Medicine

## 2021-03-09 DIAGNOSIS — Z7901 Long term (current) use of anticoagulants: Secondary | ICD-10-CM

## 2021-03-09 DIAGNOSIS — I482 Chronic atrial fibrillation, unspecified: Secondary | ICD-10-CM

## 2021-03-09 DIAGNOSIS — N184 Chronic kidney disease, stage 4 (severe): Secondary | ICD-10-CM | POA: Diagnosis not present

## 2021-03-09 LAB — POCT INR: INR: 2.4 (ref 2.0–3.0)

## 2021-03-09 NOTE — Patient Instructions (Signed)
Pre visit review using our clinic review tool, if applicable. No additional management support is needed unless otherwise documented below in the visit note.  Continue to take 1/2 tablet daily.  Shoulder surgery rescheduled for 5/11.  Take last dose of warfarin on Friday 5/6. Resume warfarin after surgery and check in 1 week.   Call Villa Herb, RN with results as well as reporting to [place]. Dosing instructions have been given to patient's son, [name] and he did verbalize understanding.

## 2021-03-09 NOTE — Telephone Encounter (Signed)
Sherri from [employer] called   Patient is scheduled for a R total shoulder replacement on 03/17/2021 with Dr. Griffin Basil. They are needing to know if she can have a lapse in her warfarin (COUMADIN) 5 MG tablet for the surgery.   Please advise and call back at [phone]

## 2021-03-11 ENCOUNTER — Telehealth: Payer: Self-pay | Admitting: General Practice

## 2021-03-11 ENCOUNTER — Other Ambulatory Visit: Payer: Self-pay | Admitting: General Practice

## 2021-03-11 DIAGNOSIS — Z7901 Long term (current) use of anticoagulants: Secondary | ICD-10-CM

## 2021-03-11 MED ORDER — ENOXAPARIN SODIUM 120 MG/0.8ML IJ SOSY: 120.0000 mg | PREFILLED_SYRINGE | INTRAMUSCULAR | 0 refills | Status: DC

## 2021-03-11 NOTE — Telephone Encounter (Signed)
-----   Message from Cassandria Anger, MD sent at 03/10/2021  7:34 AM EDT ----- Regarding: RE: Lovenox bridge Cindy, Yes. Thank you! AP ----- Message ----- From: Warden Fillers, RN Sent: 03/09/2021  10:16 AM EDT To: Cassandria Anger, MD Subject: Lovenox bridge                                 Dr. [name],  Patient is having shoulder surgery on 5/11.  OK to hold warfarin for 5 days?  Patient scores a 4 on the CHADS scale. Her CrCl is calculating at 59.65.  Are you OK with a reduced dosage of Lovenox (1 mg/KG Q24)? Please advise  Thanks! Villa Herb, RN

## 2021-03-11 NOTE — Telephone Encounter (Signed)
Instructions for coumadin and Lovenox injections pre and post surgery on 5/11.  5/6 - Last dose of coumadin until after procedure. 5/7 - Nothing (No coumadin and No Lovenox today) 5/8 - Lovenox X 1 in the AM 5/9 - Lovenox X 1 in the AM 5/10 - Lovenox X 1 in the AM (Take by 7 am) 5/11 - Procedure (DO NOT TAKE LOVENOX TODAY) 5/12 - Lovenox X 1 in the AM AND take 1 tablet of coumadin 5/13 - Lovenox X 1 in the AM AND take 1 tablet of coumadin 5/14 - Lovenox X 1 in the AM AND take 1 tablet of coumadin 5/15 - Lovenox X 1 in the AM AND take 1/2 tablet of coumadin 5/16 - Check INR

## 2021-03-11 NOTE — Progress Notes (Signed)
  Subjective:  Patient ID: Katelyn Jackson, female    DOB: Apr 21, 1946,  MRN: [id number]  Chief Complaint  Patient presents with  . Follow-up    F/u fracture left first toe-still experiencing pain that can radiate up calf and swelling,more w/ walking-wearing boot makes it feel better.     75 y.o. female presents with the above complaint. History confirmed with patient.  DOI 01/09/21  Objective:  Physical Exam: warm, good capillary refill, no trophic changes or ulcerative lesions, normal DP and PT pulses and normal sensory exam. Lymphedema BLE Left Foot: POP left 1st toe, 5th metatarsal head. Bruising over the 1st toe.  Assessment:   1. Closed displaced fracture of proximal phalanx of left great toe with routine healing, subsequent encounter     Plan:  Patient was evaluated and treated and all questions answered.  Fracture left 1st toe, 5th metatarsal -New x-rays show some signs of healing the fractures -Continue protected weightbearing -Follow-up in 1 month for new x-rays  Return in about 4 weeks (around 03/16/2021) for fracture fu with XRs.

## 2021-03-12 ENCOUNTER — Telehealth: Payer: Self-pay | Admitting: Internal Medicine

## 2021-03-12 DIAGNOSIS — I129 Hypertensive chronic kidney disease with stage 1 through stage 4 chronic kidney disease, or unspecified chronic kidney disease: Secondary | ICD-10-CM | POA: Diagnosis not present

## 2021-03-12 DIAGNOSIS — Z6841 Body Mass Index (BMI) 40.0 and over, adult: Secondary | ICD-10-CM | POA: Diagnosis not present

## 2021-03-12 DIAGNOSIS — I890 Lymphedema, not elsewhere classified: Secondary | ICD-10-CM | POA: Diagnosis not present

## 2021-03-12 DIAGNOSIS — I509 Heart failure, unspecified: Secondary | ICD-10-CM | POA: Diagnosis not present

## 2021-03-12 DIAGNOSIS — I4811 Longstanding persistent atrial fibrillation: Secondary | ICD-10-CM | POA: Diagnosis not present

## 2021-03-12 DIAGNOSIS — D631 Anemia in chronic kidney disease: Secondary | ICD-10-CM | POA: Diagnosis not present

## 2021-03-12 DIAGNOSIS — I130 Hypertensive heart and chronic kidney disease with heart failure and stage 1 through stage 4 chronic kidney disease, or unspecified chronic kidney disease: Secondary | ICD-10-CM | POA: Diagnosis not present

## 2021-03-12 DIAGNOSIS — R600 Localized edema: Secondary | ICD-10-CM | POA: Diagnosis not present

## 2021-03-12 DIAGNOSIS — D649 Anemia, unspecified: Secondary | ICD-10-CM | POA: Diagnosis not present

## 2021-03-12 DIAGNOSIS — Z48812 Encounter for surgical aftercare following surgery on the circulatory system: Secondary | ICD-10-CM | POA: Diagnosis not present

## 2021-03-12 DIAGNOSIS — M5430 Sciatica, unspecified side: Secondary | ICD-10-CM | POA: Diagnosis not present

## 2021-03-12 DIAGNOSIS — M109 Gout, unspecified: Secondary | ICD-10-CM | POA: Diagnosis not present

## 2021-03-12 DIAGNOSIS — N184 Chronic kidney disease, stage 4 (severe): Secondary | ICD-10-CM | POA: Diagnosis not present

## 2021-03-12 DIAGNOSIS — I495 Sick sinus syndrome: Secondary | ICD-10-CM | POA: Diagnosis not present

## 2021-03-12 NOTE — Telephone Encounter (Signed)
Heather w/ [facility] called and is requesting verbals for Dr. [name] to sign re certification orders for wound care. Please advise    Phone: [phone]

## 2021-03-12 NOTE — Progress Notes (Signed)
Spoke with [name], patient's son, he is aware to arrive at 12 noon 03/17/2021, aware of COVID test 5/9/at 1115AM. He is aware of the instructions for stopping coumadin and lovenox bridge.  Pre op instructions reviewed verbalized understanding.  Pacemaker device orders placed in chart. Used [employer] [phone]

## 2021-03-13 NOTE — Progress Notes (Signed)
Medical screening examination/treatment/procedure(s) were performed by non-physician practitioner and as supervising physician I was immediately available for consultation/collaboration. I agree with above. Kristoff Coonradt, MD  

## 2021-03-15 ENCOUNTER — Other Ambulatory Visit (HOSPITAL_COMMUNITY)
Admission: RE | Admit: 2021-03-15 | Discharge: 2021-03-15 | Disposition: A | Discharge status: Home / Self Care | Payer: Medicare HMO | Source: Ambulatory Visit | Attending: Orthopaedic Surgery | Admitting: Orthopaedic Surgery

## 2021-03-15 DIAGNOSIS — I129 Hypertensive chronic kidney disease with stage 1 through stage 4 chronic kidney disease, or unspecified chronic kidney disease: Secondary | ICD-10-CM | POA: Diagnosis present

## 2021-03-15 DIAGNOSIS — S42301A Unspecified fracture of shaft of humerus, right arm, initial encounter for closed fracture: Secondary | ICD-10-CM | POA: Diagnosis present

## 2021-03-15 DIAGNOSIS — R0602 Shortness of breath: Secondary | ICD-10-CM | POA: Diagnosis not present

## 2021-03-15 DIAGNOSIS — I272 Pulmonary hypertension, unspecified: Secondary | ICD-10-CM | POA: Diagnosis present

## 2021-03-15 DIAGNOSIS — S42124A Nondisplaced fracture of acromial process, right shoulder, initial encounter for closed fracture: Secondary | ICD-10-CM | POA: Diagnosis not present

## 2021-03-15 DIAGNOSIS — Z79899 Other long term (current) drug therapy: Secondary | ICD-10-CM | POA: Diagnosis not present

## 2021-03-15 DIAGNOSIS — S92352D Displaced fracture of fifth metatarsal bone, left foot, subsequent encounter for fracture with routine healing: Secondary | ICD-10-CM | POA: Diagnosis not present

## 2021-03-15 DIAGNOSIS — G8918 Other acute postprocedural pain: Secondary | ICD-10-CM | POA: Diagnosis not present

## 2021-03-15 DIAGNOSIS — Z170 Estrogen receptor positive status [ER+]: Secondary | ICD-10-CM | POA: Diagnosis not present

## 2021-03-15 DIAGNOSIS — I482 Chronic atrial fibrillation, unspecified: Secondary | ICD-10-CM | POA: Diagnosis not present

## 2021-03-15 DIAGNOSIS — E785 Hyperlipidemia, unspecified: Secondary | ICD-10-CM | POA: Diagnosis present

## 2021-03-15 DIAGNOSIS — Z888 Allergy status to other drugs, medicaments and biological substances status: Secondary | ICD-10-CM | POA: Diagnosis not present

## 2021-03-15 DIAGNOSIS — Z7901 Long term (current) use of anticoagulants: Secondary | ICD-10-CM | POA: Diagnosis not present

## 2021-03-15 DIAGNOSIS — Z9841 Cataract extraction status, right eye: Secondary | ICD-10-CM | POA: Diagnosis not present

## 2021-03-15 DIAGNOSIS — D638 Anemia in other chronic diseases classified elsewhere: Secondary | ICD-10-CM | POA: Diagnosis not present

## 2021-03-15 DIAGNOSIS — Z923 Personal history of irradiation: Secondary | ICD-10-CM | POA: Diagnosis not present

## 2021-03-15 DIAGNOSIS — Z20822 Contact with and (suspected) exposure to covid-19: Secondary | ICD-10-CM | POA: Insufficient documentation

## 2021-03-15 DIAGNOSIS — Z01812 Encounter for preprocedural laboratory examination: Secondary | ICD-10-CM | POA: Insufficient documentation

## 2021-03-15 DIAGNOSIS — N184 Chronic kidney disease, stage 4 (severe): Secondary | ICD-10-CM | POA: Diagnosis present

## 2021-03-15 DIAGNOSIS — S42123A Displaced fracture of acromial process, unspecified shoulder, initial encounter for closed fracture: Secondary | ICD-10-CM | POA: Diagnosis present

## 2021-03-15 DIAGNOSIS — M19011 Primary osteoarthritis, right shoulder: Secondary | ICD-10-CM | POA: Diagnosis not present

## 2021-03-15 DIAGNOSIS — Z880 Allergy status to penicillin: Secondary | ICD-10-CM | POA: Diagnosis not present

## 2021-03-15 DIAGNOSIS — S92512D Displaced fracture of proximal phalanx of left lesser toe(s), subsequent encounter for fracture with routine healing: Secondary | ICD-10-CM | POA: Diagnosis not present

## 2021-03-15 DIAGNOSIS — R531 Weakness: Secondary | ICD-10-CM | POA: Diagnosis not present

## 2021-03-15 DIAGNOSIS — Z950 Presence of cardiac pacemaker: Secondary | ICD-10-CM | POA: Diagnosis not present

## 2021-03-15 DIAGNOSIS — J90 Pleural effusion, not elsewhere classified: Secondary | ICD-10-CM | POA: Diagnosis not present

## 2021-03-15 DIAGNOSIS — X58XXXA Exposure to other specified factors, initial encounter: Secondary | ICD-10-CM | POA: Diagnosis present

## 2021-03-15 DIAGNOSIS — Z853 Personal history of malignant neoplasm of breast: Secondary | ICD-10-CM | POA: Diagnosis not present

## 2021-03-15 DIAGNOSIS — D62 Acute posthemorrhagic anemia: Secondary | ICD-10-CM | POA: Diagnosis present

## 2021-03-15 DIAGNOSIS — S42101A Fracture of unspecified part of scapula, right shoulder, initial encounter for closed fracture: Secondary | ICD-10-CM | POA: Diagnosis not present

## 2021-03-15 DIAGNOSIS — D509 Iron deficiency anemia, unspecified: Secondary | ICD-10-CM | POA: Diagnosis present

## 2021-03-15 DIAGNOSIS — S42121A Displaced fracture of acromial process, right shoulder, initial encounter for closed fracture: Secondary | ICD-10-CM | POA: Diagnosis not present

## 2021-03-15 DIAGNOSIS — S42201A Unspecified fracture of upper end of right humerus, initial encounter for closed fracture: Secondary | ICD-10-CM | POA: Diagnosis not present

## 2021-03-15 DIAGNOSIS — Z961 Presence of intraocular lens: Secondary | ICD-10-CM | POA: Diagnosis present

## 2021-03-15 DIAGNOSIS — E039 Hypothyroidism, unspecified: Secondary | ICD-10-CM | POA: Diagnosis not present

## 2021-03-15 DIAGNOSIS — Z9842 Cataract extraction status, left eye: Secondary | ICD-10-CM | POA: Diagnosis not present

## 2021-03-15 DIAGNOSIS — C50412 Malignant neoplasm of upper-outer quadrant of left female breast: Secondary | ICD-10-CM | POA: Diagnosis not present

## 2021-03-15 DIAGNOSIS — K219 Gastro-esophageal reflux disease without esophagitis: Secondary | ICD-10-CM | POA: Diagnosis present

## 2021-03-15 DIAGNOSIS — Z6841 Body Mass Index (BMI) 40.0 and over, adult: Secondary | ICD-10-CM | POA: Diagnosis not present

## 2021-03-15 DIAGNOSIS — I5023 Acute on chronic systolic (congestive) heart failure: Secondary | ICD-10-CM | POA: Diagnosis not present

## 2021-03-15 DIAGNOSIS — I517 Cardiomegaly: Secondary | ICD-10-CM | POA: Diagnosis not present

## 2021-03-15 DIAGNOSIS — I4821 Permanent atrial fibrillation: Secondary | ICD-10-CM | POA: Diagnosis present

## 2021-03-15 DIAGNOSIS — D649 Anemia, unspecified: Secondary | ICD-10-CM | POA: Diagnosis not present

## 2021-03-15 DIAGNOSIS — Z96611 Presence of right artificial shoulder joint: Secondary | ICD-10-CM | POA: Diagnosis not present

## 2021-03-15 DIAGNOSIS — Z823 Family history of stroke: Secondary | ICD-10-CM | POA: Diagnosis not present

## 2021-03-15 DIAGNOSIS — R0600 Dyspnea, unspecified: Secondary | ICD-10-CM | POA: Diagnosis not present

## 2021-03-15 DIAGNOSIS — Z79890 Hormone replacement therapy (postmenopausal): Secondary | ICD-10-CM | POA: Diagnosis not present

## 2021-03-15 LAB — SARS CORONAVIRUS 2 (TAT 6-24 HRS): SARS Coronavirus 2: NEGATIVE

## 2021-03-15 NOTE — Telephone Encounter (Signed)
Called Heather there was no answer LMOM w/MD response.Marland KitchenJohny Chess

## 2021-03-15 NOTE — Telephone Encounter (Signed)
Ok Thx 

## 2021-03-16 NOTE — H&P (Signed)
PREOPERATIVE H&P  Chief Complaint: right humerus and scapula fracture  HPI: Katelyn Jackson is a 75 y.o. female who is scheduled for Procedure(s): TOTAL SHOULDER ARTHROPLASTY.   Patient has a past medical history significant for presence of permanent cardiac pacemaker, pulmonary HTN, hypothyroidism, HTN, HLD, GERD, CKD, breast cancer with previous radiation, atrial fibrillation on warfarin, BMI over 50.   Katelyn Jackson reports that she has fallen a bunch over the past six months. Over the past three months she has developed severe right sided shoulder pain with a limited range of motion. This hurts along the posterior part of her shoulder especially. She is getting some symptoms that radiate down the arm into the hand that she describes as burning type pain. Joint aspiration showed no signs of infection.   Her symptoms are rated as moderate to severe, and have been worsening.  This is significantly impairing activities of daily living.    Please see clinic note for further details on this patient's care.    She has elected for surgical management.   Past Medical History:  Diagnosis Date  . Arthritis   . Cancer [facility])    breast cancer  . Chronic atrial fibrillation ([place])   . Chronic kidney disease    sees Dr Florene Glen  . Cramps, muscle, general   . Dyspnea on exertion   . Dysrhythmia    a-fib,   . GERD (gastroesophageal reflux disease)   . Headache   . Hyperlipidemia   . Hypertension   . Hypothyroidism   . Lymphedema   . Moderate to severe pulmonary hypertension ([place])   . Obesity   . Personal history of radiation therapy   . Pneumonia 12/2020  . Presence of permanent cardiac pacemaker 01/21/2021   for bradycardia   . Syncope 12/2020   needed a pacemaker   Past Surgical History:  Procedure Laterality Date  . APPENDECTOMY    . BREAST BIOPSY Left 2018  . BREAST LUMPECTOMY Left 04/04/2017   x3  . BREAST LUMPECTOMY WITH NEEDLE LOCALIZATION AND AXILLARY SENTINEL LYMPH  NODE BX Left 04/04/2017   Procedure: BREAST LUMPECTOMY WITH NEEDLE LOCALIZATION x3 AND AXILLARY SENTINEL LYMPH NODE BX;  Surgeon: [name], MD;  Location: [place];  Service: General;  Laterality: Left;  . CATARACT EXTRACTION W/ INTRAOCULAR LENS  IMPLANT, BILATERAL Bilateral 2018  . COLONOSCOPY    . EYE SURGERY    . GLAUCOMA SURGERY Bilateral 2018  . INSERT / REPLACE / REMOVE PACEMAKER  01/21/2021  . RE-EXCISION OF BREAST CANCER,SUPERIOR MARGINS Left 04/26/2017   Procedure: RE-EXCISION OF LEFT BREAST CANCER;  Surgeon: [name], MD;  Location: [place];  Service: General;  Laterality: Left;   Social History   Socioeconomic History  . Marital status: Widowed    Spouse name: Not on file  . Number of children: 1  . Years of education: Not on file  . Highest education level: Not on file  Occupational History  . Not on file  Tobacco Use  . Smoking status: Never Smoker  . Smokeless tobacco: Never Used  Vaping Use  . Vaping Use: Never used  Substance and Sexual Activity  . Alcohol use: No    Alcohol/week: 0.0 standard drinks  . Drug use: No  . Sexual activity: Not on file  Other Topics Concern  . Not on file  Social History Narrative  . Not on file   Social Determinants of Health   Financial Resource Strain: Not on file  Food Insecurity: Not  on file  Transportation Needs: Not on file  Physical Activity: Not on file  Stress: Not on file  Social Connections: Not on file   Family History  Problem Relation Age of Onset  . CAD Mother 100  . Stroke Mother 62       hemorr CVA  . COPD Father 34  . Cancer Neg Hx    Allergies  Allergen Reactions  . Chlorhexidine Gluconate Hives  . Penicillins Itching    Has patient had a PCN reaction causing immediate rash, facial/tongue/throat swelling, SOB or lightheadedness with hypotension: no Has patient had a PCN reaction causing severe rash involving mucus membranes or skin necrosis: No Has patient had a PCN reaction that required  hospitalization: No Has patient had a PCN reaction occurring within the last 10 years: No If all of the above answers are "NO", then may proceed with Cephalosporin use.   Prior to Admission medications   Medication Sig Start Date End Date Taking? Authorizing Provider  allopurinol (ZYLOPRIM) 100 MG tablet Take 0.5 tablets (50 mg total) by mouth daily. 10/08/20   Plotnikov, [name], MD  carvedilol (COREG) 6.25 MG tablet Take 1 tablet (6.25 mg total) by mouth 2 (two) times daily with a meal. Overdue for Annual appt must see provider for future refills 10/08/20   Plotnikov, [name], MD  Cholecalciferol (VITAMIN D3) 2000 units capsule Take 1 capsule (2,000 Units total) by mouth daily. 07/11/17   Plotnikov, [name], MD  Cyanocobalamin (VITAMIN B-12 PO) Take 2,000 mcg by mouth daily.    [provider]  diclofenac Sodium (VOLTAREN) 1 % GEL APPLY 4 GRAMS FOUR TIMES DAILY AS NEEDED Patient taking differently: Apply 4 g topically 4 (four) times daily as needed (pain). APPLY 4 GRAMS FOUR TIMES DAILY AS NEEDED 10/08/20   Plotnikov, [name], MD  enoxaparin (LOVENOX) 120 MG/0.8ML injection Inject 0.8 mLs (120 mg total) into the skin daily for 7 doses. 03/11/21 03/18/21  Plotnikov, [name], MD  exemestane (AROMASIN) 25 MG tablet Take 1 tablet (25 mg total) by mouth daily after breakfast. 10/08/20   Plotnikov, [name], MD  ferrous sulfate 325 (65 FE) MG tablet Take 325 mg by mouth daily with breakfast.    [provider]  levothyroxine (SYNTHROID) 50 MCG tablet Take 1 tablet (50 mcg total) by mouth daily before breakfast. Overdue for Annual appt must see provider for future refills 02/09/21   Plotnikov, [name], MD  lipase/protease/amylase (CREON) 12000-38000 units CPEP capsule Take 1 capsule (12,000 Units total) by mouth daily as needed (stomach problems). 10/08/20   Plotnikov, [name], MD  lovastatin (MEVACOR) 20 MG tablet Take 1 tablet (20 mg total) by mouth daily. 10/08/20   Plotnikov,  [name], MD  magnesium oxide (MAG-OX) 400 MG tablet Take 400 mg by mouth 2 (two) times daily.    [provider]  methocarbamol (ROBAXIN) 500 MG tablet Take 1 tablet (500 mg total) by mouth 2 (two) times daily. Patient taking differently: Take 500 mg by mouth 2 (two) times daily as needed for muscle spasms. 12/22/20   Fatima Blank, MD  omeprazole (PRILOSEC) 40 MG capsule Take 1 capsule (40 mg total) by mouth daily. Overdue for Annual appt must see provider for future refills Patient taking differently: Take 40 mg by mouth daily as needed (acid reflux). Overdue for Annual appt must see provider for future refills 10/08/20   Plotnikov, [name], MD  OVER THE COUNTER MEDICATION Apply 1 application topically daily as  needed (apply to buttocks  as needed for irritaiton). Intensive Skin Care Therapy    [provider]  polyethylene glycol powder (GLYCOLAX/MIRALAX) powder mix 1 capful (17 grams) IN 8 ounces OF liquid EVERY DAY Patient taking differently: Take 17 g by mouth daily. mix 1 capful (17 grams) IN 8 ounces OF liquid EVERY DAY 11/05/18   Plotnikov, [name], MD  potassium chloride (KLOR-CON) 8 MEQ tablet Take 1 tablet (8 mEq total) by mouth daily. 10/08/20   Plotnikov, [name], MD  torsemide (DEMADEX) 20 MG tablet Take 20 mg by mouth 3 (three) times a week.    [provider]  traMADol (ULTRAM) 50 MG tablet Take 1 tablet (50 mg total) by mouth every 12 (twelve) hours. Patient taking differently: Take 50 mg by mouth every 12 (twelve) hours as needed for severe pain. 02/09/21   Plotnikov, [name], MD  warfarin (COUMADIN) 5 MG tablet Take 1 tablet daily except take 1/2 tablet on Wed and Sat or Take as directed by anticoagulation clinic Patient taking differently: Take 2.5 mg by mouth daily. Take as directed by anticoagulation clinic 10/08/20   Plotnikov, [name], MD    ROS: All other systems have been reviewed and were otherwise negative with the exception of  those mentioned in the HPI and as above.  Physical Exam: General: Alert, no acute distress Cardiovascular: No pedal edema Respiratory: No cyanosis, no use of accessory musculature GI: No organomegaly, abdomen is soft and non-tender Skin: No lesions in the area of chief complaint Neurologic: Sensation intact distally Psychiatric: Patient is competent for consent with normal mood and affect Lymphatic: No axillary or cervical lymphadenopathy  MUSCULOSKELETAL:  Right shoulder: Active forward elevation to about 10 degrees, passive to about 40, limited by pain.  Patient has no obvious swelling, though body habitus limits this exam.  Axillary nerve sensation appears to be intact.    Imaging: X-rays and CT demonstrate complete erosive changes of the joint.  Chronic appearing infectious type process with no sign of malignancy on the CT.  She is unable to get an MRI secondary to her pacemaker.  She has erosive changes in the acromion and the scapula and a fracture with about 2 centimeters of gapping of the acromion as well.    Assessment: right humerus and scapula fracture  Plan: Plan for Procedure(s): TOTAL SHOULDER ARTHROPLASTY  Patient has a complex set of issues.  She is extraordinarily high risk for complication, however, she appears to have an infectious process in her shoulder and is limited in her function. We did talk to Dr. Barry Dienes about her care as well.  We will send her ahead for pathology, but the plan would be antibiotic versus hemiarthroplasty spacer and management with a staged arthroplasty versus a definitive hemiarthroplasty, as I do not know that her glenoid is reconstructable at all.  Her acromial fracture would also need to be treated prior to any reverse total shoulder arthroplasty type findings.  We think that based on patient's function a hemiarthroplasty may be sufficient.    The risks benefits and alternatives were discussed with the patient including but not limited to the  risks of nonoperative treatment, versus surgical intervention including infection, bleeding, nerve injury,  blood clots, cardiopulmonary complications, morbidity, mortality, among others, and they were willing to proceed.   We additionally specifically discussed risks of axillary nerve injury, infection, periprosthetic fracture, continued pain and longevity of implants prior to beginning procedure.    Patient will be closely monitored in  PACU for medical stabilization and pain control. If found stable in PACU, patient may be discharged home with outpatient follow-up. If any concerns regarding patient's stabilization patient will be admitted for observation after surgery. The patient is planning to be discharged home with outpatient PT.  Will request hospitalist consultation if patient is admitted to hospital following surgery.   The patient acknowledged the explanation, agreed to proceed with the plan and consent was signed.   Patient received operative clearance from Dr. [name].   Per coumadin clinic note:  "5/6 - Last dose of coumadin until after procedure. 5/7 - Nothing (No coumadin and No Lovenox today) 5/8 - Lovenox X 1 in the AM 5/9 - Lovenox X 1 in the AM 5/10 - Lovenox X 1 in the AM (Take by 7 am) 5/11 - Procedure (DO NOT TAKE LOVENOX TODAY) 5/12 - Lovenox X 1 in the AM AND take 1 tablet of coumadin 5/13 - Lovenox X 1 in the AM AND take 1 tablet of coumadin 5/14 - Lovenox X 1 in the AM AND take 1 tablet of coumadin 5/15 - Lovenox X 1 in the AM AND take 1/2 tablet of coumadin 5/16 - Check INR"  Operative Plan: Right shoulder hemiarthroplasty versus antibiotic spacer. Send head for pathology Discharge Medications: Tylenol, Oxycodone DVT Prophylaxis: Resume warfarin Physical Therapy: outpatient PT - delayed therapy Special Discharge needs: Sling. [employer].    Ethelda Chick, PA-C  03/16/2021 6:58 AM

## 2021-03-17 ENCOUNTER — Ambulatory Visit (HOSPITAL_COMMUNITY): Payer: Medicare HMO | Admitting: Physician Assistant

## 2021-03-17 ENCOUNTER — Other Ambulatory Visit: Payer: Self-pay

## 2021-03-17 ENCOUNTER — Ambulatory Visit (HOSPITAL_COMMUNITY): Payer: Medicare HMO | Admitting: Certified Registered"

## 2021-03-17 ENCOUNTER — Encounter (HOSPITAL_COMMUNITY)
Admission: RE | Disposition: A | Discharge status: Home / Self Care | Payer: Self-pay | Source: Home / Self Care | Attending: Orthopaedic Surgery

## 2021-03-17 ENCOUNTER — Ambulatory Visit (HOSPITAL_COMMUNITY): Payer: Medicare HMO

## 2021-03-17 ENCOUNTER — Encounter (HOSPITAL_COMMUNITY): Payer: Self-pay | Admitting: Orthopaedic Surgery

## 2021-03-17 ENCOUNTER — Inpatient Hospital Stay (HOSPITAL_COMMUNITY)
Admission: RE | Admit: 2021-03-17 | Discharge: 2021-03-20 | DRG: 483 | Disposition: A | Discharge status: Home Health | Payer: Medicare HMO | Attending: Orthopaedic Surgery | Admitting: Orthopaedic Surgery

## 2021-03-17 DIAGNOSIS — E785 Hyperlipidemia, unspecified: Secondary | ICD-10-CM | POA: Diagnosis present

## 2021-03-17 DIAGNOSIS — D649 Anemia, unspecified: Secondary | ICD-10-CM | POA: Diagnosis not present

## 2021-03-17 DIAGNOSIS — G8918 Other acute postprocedural pain: Secondary | ICD-10-CM | POA: Diagnosis not present

## 2021-03-17 DIAGNOSIS — S42124A Nondisplaced fracture of acromial process, right shoulder, initial encounter for closed fracture: Secondary | ICD-10-CM | POA: Diagnosis not present

## 2021-03-17 DIAGNOSIS — E66812 Obesity, class 2: Secondary | ICD-10-CM | POA: Diagnosis present

## 2021-03-17 DIAGNOSIS — Z96611 Presence of right artificial shoulder joint: Secondary | ICD-10-CM

## 2021-03-17 DIAGNOSIS — Z9841 Cataract extraction status, right eye: Secondary | ICD-10-CM

## 2021-03-17 DIAGNOSIS — I482 Chronic atrial fibrillation, unspecified: Secondary | ICD-10-CM | POA: Diagnosis present

## 2021-03-17 DIAGNOSIS — Z20822 Contact with and (suspected) exposure to covid-19: Secondary | ICD-10-CM | POA: Diagnosis present

## 2021-03-17 DIAGNOSIS — Z79890 Hormone replacement therapy (postmenopausal): Secondary | ICD-10-CM

## 2021-03-17 DIAGNOSIS — Z853 Personal history of malignant neoplasm of breast: Secondary | ICD-10-CM

## 2021-03-17 DIAGNOSIS — I4821 Permanent atrial fibrillation: Secondary | ICD-10-CM | POA: Diagnosis present

## 2021-03-17 DIAGNOSIS — Z7901 Long term (current) use of anticoagulants: Secondary | ICD-10-CM

## 2021-03-17 DIAGNOSIS — X58XXXA Exposure to other specified factors, initial encounter: Secondary | ICD-10-CM | POA: Diagnosis present

## 2021-03-17 DIAGNOSIS — S42123A Displaced fracture of acromial process, unspecified shoulder, initial encounter for closed fracture: Principal | ICD-10-CM | POA: Diagnosis present

## 2021-03-17 DIAGNOSIS — Z9842 Cataract extraction status, left eye: Secondary | ICD-10-CM

## 2021-03-17 DIAGNOSIS — D638 Anemia in other chronic diseases classified elsewhere: Secondary | ICD-10-CM | POA: Diagnosis present

## 2021-03-17 DIAGNOSIS — Z888 Allergy status to other drugs, medicaments and biological substances status: Secondary | ICD-10-CM

## 2021-03-17 DIAGNOSIS — Z09 Encounter for follow-up examination after completed treatment for conditions other than malignant neoplasm: Secondary | ICD-10-CM

## 2021-03-17 DIAGNOSIS — Z950 Presence of cardiac pacemaker: Secondary | ICD-10-CM

## 2021-03-17 DIAGNOSIS — C50412 Malignant neoplasm of upper-outer quadrant of left female breast: Secondary | ICD-10-CM

## 2021-03-17 DIAGNOSIS — E039 Hypothyroidism, unspecified: Secondary | ICD-10-CM | POA: Diagnosis not present

## 2021-03-17 DIAGNOSIS — S42121A Displaced fracture of acromial process, right shoulder, initial encounter for closed fracture: Secondary | ICD-10-CM | POA: Diagnosis not present

## 2021-03-17 DIAGNOSIS — Z923 Personal history of irradiation: Secondary | ICD-10-CM

## 2021-03-17 DIAGNOSIS — D62 Acute posthemorrhagic anemia: Secondary | ICD-10-CM | POA: Diagnosis present

## 2021-03-17 DIAGNOSIS — N1832 Chronic kidney disease, stage 3b: Secondary | ICD-10-CM | POA: Diagnosis present

## 2021-03-17 DIAGNOSIS — Z170 Estrogen receptor positive status [ER+]: Secondary | ICD-10-CM

## 2021-03-17 DIAGNOSIS — Z79899 Other long term (current) drug therapy: Secondary | ICD-10-CM

## 2021-03-17 DIAGNOSIS — D509 Iron deficiency anemia, unspecified: Secondary | ICD-10-CM | POA: Diagnosis present

## 2021-03-17 DIAGNOSIS — Z791 Long term (current) use of non-steroidal anti-inflammatories (NSAID): Secondary | ICD-10-CM

## 2021-03-17 DIAGNOSIS — S42101A Fracture of unspecified part of scapula, right shoulder, initial encounter for closed fracture: Secondary | ICD-10-CM | POA: Diagnosis not present

## 2021-03-17 DIAGNOSIS — Z6841 Body Mass Index (BMI) 40.0 and over, adult: Secondary | ICD-10-CM

## 2021-03-17 DIAGNOSIS — N179 Acute kidney failure, unspecified: Secondary | ICD-10-CM | POA: Diagnosis present

## 2021-03-17 DIAGNOSIS — Z8249 Family history of ischemic heart disease and other diseases of the circulatory system: Secondary | ICD-10-CM

## 2021-03-17 DIAGNOSIS — S92902A Unspecified fracture of left foot, initial encounter for closed fracture: Secondary | ICD-10-CM

## 2021-03-17 DIAGNOSIS — Z823 Family history of stroke: Secondary | ICD-10-CM

## 2021-03-17 DIAGNOSIS — R0600 Dyspnea, unspecified: Secondary | ICD-10-CM | POA: Diagnosis not present

## 2021-03-17 DIAGNOSIS — N184 Chronic kidney disease, stage 4 (severe): Secondary | ICD-10-CM | POA: Diagnosis present

## 2021-03-17 DIAGNOSIS — Z880 Allergy status to penicillin: Secondary | ICD-10-CM

## 2021-03-17 DIAGNOSIS — K219 Gastro-esophageal reflux disease without esophagitis: Secondary | ICD-10-CM | POA: Diagnosis present

## 2021-03-17 DIAGNOSIS — S42201A Unspecified fracture of upper end of right humerus, initial encounter for closed fracture: Secondary | ICD-10-CM | POA: Diagnosis not present

## 2021-03-17 DIAGNOSIS — M19011 Primary osteoarthritis, right shoulder: Secondary | ICD-10-CM | POA: Diagnosis not present

## 2021-03-17 DIAGNOSIS — Z9049 Acquired absence of other specified parts of digestive tract: Secondary | ICD-10-CM

## 2021-03-17 DIAGNOSIS — I129 Hypertensive chronic kidney disease with stage 1 through stage 4 chronic kidney disease, or unspecified chronic kidney disease: Secondary | ICD-10-CM | POA: Diagnosis present

## 2021-03-17 DIAGNOSIS — I272 Pulmonary hypertension, unspecified: Secondary | ICD-10-CM | POA: Diagnosis present

## 2021-03-17 DIAGNOSIS — Z961 Presence of intraocular lens: Secondary | ICD-10-CM | POA: Diagnosis present

## 2021-03-17 HISTORY — PX: SHOULDER HEMI-ARTHROPLASTY: SHX5049

## 2021-03-17 LAB — BASIC METABOLIC PANEL
Anion gap: 8 (ref 5–15)
BUN: 17 mg/dL (ref 8–23)
CO2: 28 mmol/L (ref 22–32)
Calcium: 9.6 mg/dL (ref 8.9–10.3)
Chloride: 102 mmol/L (ref 98–111)
Creatinine, Ser: 1.67 mg/dL — ABNORMAL HIGH (ref 0.44–1.00)
GFR, Estimated: 32 mL/min — ABNORMAL LOW (ref >60)
Glucose, Bld: 111 mg/dL — ABNORMAL HIGH (ref 70–99)
Potassium: 3.9 mmol/L (ref 3.5–5.1)
Sodium: 138 mmol/L (ref 135–145)

## 2021-03-17 LAB — CBC
HCT: 32.8 % — ABNORMAL LOW (ref 36.0–46.0)
Hemoglobin: 9.4 g/dL — ABNORMAL LOW (ref 12.0–15.0)
MCH: 26.5 pg (ref 26.0–34.0)
MCHC: 28.7 g/dL — ABNORMAL LOW (ref 30.0–36.0)
MCV: 92.4 fL (ref 80.0–100.0)
Platelets: 247 10*3/uL (ref 150–400)
RBC: 3.55 MIL/uL — ABNORMAL LOW (ref 3.87–5.11)
RDW: 17.1 % — ABNORMAL HIGH (ref 11.5–15.5)
WBC: 4.9 10*3/uL (ref 4.0–10.5)
nRBC: 0 % (ref 0.0–0.2)

## 2021-03-17 LAB — APTT: aPTT: 29 seconds (ref 24–36)

## 2021-03-17 LAB — ABO/RH: ABO/RH(D): O POS

## 2021-03-17 LAB — PROTIME-INR
INR: 2.1 — ABNORMAL HIGH (ref 0.8–1.2)
Prothrombin Time: 23.8 seconds — ABNORMAL HIGH (ref 11.4–15.2)

## 2021-03-17 SURGERY — HEMIARTHROPLASTY, SHOULDER
Anesthesia: General | Site: Shoulder | Laterality: Right

## 2021-03-17 MED ORDER — WARFARIN - PHYSICIAN DOSING INPATIENT: Freq: Every day | Status: DC

## 2021-03-17 MED ORDER — SODIUM CHLORIDE 0.9 % IV SOLN: INTRAVENOUS | Status: DC | PRN

## 2021-03-17 MED ORDER — HYDROCODONE-ACETAMINOPHEN 7.5-325 MG PO TABS
1.0000 | ORAL_TABLET | ORAL | Status: DC | PRN
Start: 2021-03-17 — End: 2021-03-20
  Administered 2021-03-18 – 2021-03-19 (×3): 1 via ORAL
  Filled 2021-03-17 (×3): qty 1

## 2021-03-17 MED ORDER — MORPHINE SULFATE (PF) 2 MG/ML IV SOLN: 0.5000 mg | INTRAVENOUS | Status: DC | PRN

## 2021-03-17 MED ORDER — DOCUSATE SODIUM 100 MG PO CAPS
100.0000 mg | ORAL_CAPSULE | Freq: Two times a day (BID) | ORAL | Status: DC
  Administered 2021-03-17 – 2021-03-20 (×5): 100 mg via ORAL
  Filled 2021-03-17 (×5): qty 1

## 2021-03-17 MED ORDER — CARVEDILOL 6.25 MG PO TABS
6.2500 mg | ORAL_TABLET | Freq: Two times a day (BID) | ORAL | Status: DC
  Administered 2021-03-17 – 2021-03-20 (×6): 6.25 mg via ORAL
  Filled 2021-03-17 (×6): qty 1

## 2021-03-17 MED ORDER — DIPHENHYDRAMINE HCL 12.5 MG/5ML PO ELIX: 12.5000 mg | ORAL_SOLUTION | ORAL | Status: DC | PRN

## 2021-03-17 MED ORDER — SUCCINYLCHOLINE CHLORIDE 200 MG/10ML IV SOSY
PREFILLED_SYRINGE | INTRAVENOUS | Status: DC | PRN
  Administered 2021-03-17: 100 mg via INTRAVENOUS

## 2021-03-17 MED ORDER — ONDANSETRON HCL 4 MG/2ML IJ SOLN: 4.0000 mg | Freq: Four times a day (QID) | INTRAMUSCULAR | Status: DC | PRN

## 2021-03-17 MED ORDER — PHENOL 1.4 % MT LIQD: 1.0000 | OROMUCOSAL | Status: DC | PRN

## 2021-03-17 MED ORDER — STERILE WATER FOR IRRIGATION IR SOLN
Status: DC | PRN
  Administered 2021-03-17 (×2): 1000 mL

## 2021-03-17 MED ORDER — LIDOCAINE 2% (20 MG/ML) 5 ML SYRINGE
INTRAMUSCULAR | Status: DC | PRN
  Administered 2021-03-17: 40 mg via INTRAVENOUS

## 2021-03-17 MED ORDER — METHOCARBAMOL 1000 MG/10ML IJ SOLN
500.0000 mg | Freq: Four times a day (QID) | INTRAVENOUS | Status: DC | PRN
  Filled 2021-03-17: qty 5

## 2021-03-17 MED ORDER — CEFAZOLIN SODIUM-DEXTROSE 2-4 GM/100ML-% IV SOLN
INTRAVENOUS | Status: AC
  Filled 2021-03-17: qty 100

## 2021-03-17 MED ORDER — FERROUS SULFATE 325 (65 FE) MG PO TABS
325.0000 mg | ORAL_TABLET | Freq: Every day | ORAL | Status: DC
  Administered 2021-03-18 – 2021-03-20 (×3): 325 mg via ORAL
  Filled 2021-03-17 (×3): qty 1

## 2021-03-17 MED ORDER — DEXAMETHASONE SODIUM PHOSPHATE 10 MG/ML IJ SOLN
INTRAMUSCULAR | Status: DC | PRN
  Administered 2021-03-17: 5 mg via INTRAVENOUS

## 2021-03-17 MED ORDER — METHOCARBAMOL 500 MG PO TABS: 500.0000 mg | ORAL_TABLET | Freq: Four times a day (QID) | ORAL | Status: DC | PRN

## 2021-03-17 MED ORDER — ORAL CARE MOUTH RINSE
15.0000 mL | Freq: Once | OROMUCOSAL | Status: AC
  Administered 2021-03-17: 15 mL via OROMUCOSAL

## 2021-03-17 MED ORDER — ACETAMINOPHEN 160 MG/5ML PO SOLN: 325.0000 mg | ORAL | Status: DC | PRN

## 2021-03-17 MED ORDER — ROCURONIUM BROMIDE 10 MG/ML (PF) SYRINGE
PREFILLED_SYRINGE | INTRAVENOUS | Status: DC | PRN
  Administered 2021-03-17: 50 mg via INTRAVENOUS

## 2021-03-17 MED ORDER — EXEMESTANE 25 MG PO TABS
25.0000 mg | ORAL_TABLET | Freq: Every day | ORAL | Status: DC
  Administered 2021-03-18 – 2021-03-20 (×3): 25 mg via ORAL
  Filled 2021-03-17 (×3): qty 1

## 2021-03-17 MED ORDER — WARFARIN SODIUM 2.5 MG PO TABS: 2.5000 mg | ORAL_TABLET | Freq: Every day | ORAL | Status: DC

## 2021-03-17 MED ORDER — BUPIVACAINE HCL 0.5 % IJ SOLN
INTRAMUSCULAR | Status: DC | PRN
  Administered 2021-03-17: 10 mL

## 2021-03-17 MED ORDER — ENOXAPARIN SODIUM 120 MG/0.8ML IJ SOSY
120.0000 mg | PREFILLED_SYRINGE | INTRAMUSCULAR | Status: DC
  Filled 2021-03-17: qty 0.8

## 2021-03-17 MED ORDER — ALLOPURINOL 100 MG PO TABS
50.0000 mg | ORAL_TABLET | Freq: Every day | ORAL | Status: DC
  Administered 2021-03-18 – 2021-03-20 (×3): 50 mg via ORAL
  Filled 2021-03-17 (×3): qty 0.5

## 2021-03-17 MED ORDER — PRAVASTATIN SODIUM 20 MG PO TABS
20.0000 mg | ORAL_TABLET | Freq: Every day | ORAL | Status: DC
  Administered 2021-03-19: 20 mg via ORAL
  Filled 2021-03-17: qty 1

## 2021-03-17 MED ORDER — PROMETHAZINE HCL 25 MG/ML IJ SOLN: 6.2500 mg | INTRAMUSCULAR | Status: DC | PRN

## 2021-03-17 MED ORDER — TRANEXAMIC ACID-NACL 1000-0.7 MG/100ML-% IV SOLN
INTRAVENOUS | Status: AC
  Filled 2021-03-17: qty 100

## 2021-03-17 MED ORDER — CHLORHEXIDINE GLUCONATE 0.12 % MT SOLN: 15.0000 mL | Freq: Once | OROMUCOSAL | Status: DC

## 2021-03-17 MED ORDER — OXYCODONE HCL 5 MG/5ML PO SOLN: 5.0000 mg | Freq: Once | ORAL | Status: DC | PRN

## 2021-03-17 MED ORDER — TRANEXAMIC ACID 1000 MG/10ML IV SOLN
INTRAVENOUS | Status: DC | PRN
  Administered 2021-03-17: 1000 mg via INTRAVENOUS

## 2021-03-17 MED ORDER — 0.9 % SODIUM CHLORIDE (POUR BTL) OPTIME
TOPICAL | Status: DC | PRN
  Administered 2021-03-17: 1000 mL

## 2021-03-17 MED ORDER — POTASSIUM CHLORIDE ER 8 MEQ PO TBCR
8.0000 meq | EXTENDED_RELEASE_TABLET | Freq: Every day | ORAL | Status: DC
  Filled 2021-03-17: qty 1

## 2021-03-17 MED ORDER — FENTANYL CITRATE (PF) 100 MCG/2ML IJ SOLN
50.0000 ug | Freq: Once | INTRAMUSCULAR | Status: AC
Start: 2021-03-17 — End: 2021-03-17
  Administered 2021-03-17: 25 ug via INTRAVENOUS
  Filled 2021-03-17: qty 2

## 2021-03-17 MED ORDER — SUGAMMADEX SODIUM 200 MG/2ML IV SOLN
INTRAVENOUS | Status: DC | PRN
  Administered 2021-03-17: 250 mg via INTRAVENOUS

## 2021-03-17 MED ORDER — FENTANYL CITRATE (PF) 100 MCG/2ML IJ SOLN: 25.0000 ug | INTRAMUSCULAR | Status: DC | PRN

## 2021-03-17 MED ORDER — TORSEMIDE 20 MG PO TABS
20.0000 mg | ORAL_TABLET | ORAL | Status: DC
  Administered 2021-03-18 – 2021-03-20 (×2): 20 mg via ORAL
  Filled 2021-03-17 (×2): qty 1

## 2021-03-17 MED ORDER — LACTATED RINGERS IV SOLN: INTRAVENOUS | Status: DC

## 2021-03-17 MED ORDER — OXYCODONE HCL 5 MG PO TABS
5.0000 mg | ORAL_TABLET | Freq: Once | ORAL | Status: DC | PRN
Start: 2021-03-17 — End: 2021-03-17

## 2021-03-17 MED ORDER — ACETAMINOPHEN 500 MG PO TABS
1000.0000 mg | ORAL_TABLET | Freq: Three times a day (TID) | ORAL | Status: AC
  Administered 2021-03-17 – 2021-03-18 (×3): 1000 mg via ORAL
  Filled 2021-03-17 (×4): qty 2

## 2021-03-17 MED ORDER — PHENYLEPHRINE HCL-NACL 10-0.9 MG/250ML-% IV SOLN
INTRAVENOUS | Status: DC | PRN
  Administered 2021-03-17: 50 ug/min via INTRAVENOUS

## 2021-03-17 MED ORDER — SODIUM CHLORIDE 0.9 % IR SOLN
Status: DC | PRN
  Administered 2021-03-17: 1000 mL

## 2021-03-17 MED ORDER — PANCRELIPASE (LIP-PROT-AMYL) 12000-38000 UNITS PO CPEP: 12000.0000 [IU] | ORAL_CAPSULE | Freq: Every day | ORAL | Status: DC | PRN

## 2021-03-17 MED ORDER — MAGNESIUM OXIDE -MG SUPPLEMENT 400 (240 MG) MG PO TABS
400.0000 mg | ORAL_TABLET | Freq: Two times a day (BID) | ORAL | Status: DC
  Administered 2021-03-17 – 2021-03-20 (×5): 400 mg via ORAL
  Filled 2021-03-17 (×5): qty 1

## 2021-03-17 MED ORDER — VANCOMYCIN HCL 1 G IV SOLR
INTRAVENOUS | Status: DC | PRN
  Administered 2021-03-17: 1000 mg via TOPICAL

## 2021-03-17 MED ORDER — PROPOFOL 10 MG/ML IV BOLUS
INTRAVENOUS | Status: AC
  Filled 2021-03-17: qty 20

## 2021-03-17 MED ORDER — PROPOFOL 10 MG/ML IV BOLUS
INTRAVENOUS | Status: DC | PRN
  Administered 2021-03-17: 120 mg via INTRAVENOUS

## 2021-03-17 MED ORDER — FENTANYL CITRATE (PF) 100 MCG/2ML IJ SOLN
INTRAMUSCULAR | Status: DC | PRN
  Administered 2021-03-17: 100 ug via INTRAVENOUS

## 2021-03-17 MED ORDER — BUPIVACAINE LIPOSOME 1.3 % IJ SUSP
INTRAMUSCULAR | Status: DC | PRN
  Administered 2021-03-17: 10 mL via PERINEURAL

## 2021-03-17 MED ORDER — CEFAZOLIN SODIUM-DEXTROSE 2-4 GM/100ML-% IV SOLN
2.0000 g | INTRAVENOUS | Status: AC
  Administered 2021-03-17: 1 g via INTRAVENOUS
  Administered 2021-03-17: 2 g via INTRAVENOUS
  Filled 2021-03-17: qty 100

## 2021-03-17 MED ORDER — MENTHOL 3 MG MT LOZG: 1.0000 | LOZENGE | OROMUCOSAL | Status: DC | PRN

## 2021-03-17 MED ORDER — HYDROCODONE-ACETAMINOPHEN 5-325 MG PO TABS
1.0000 | ORAL_TABLET | ORAL | Status: DC | PRN
Start: 2021-03-17 — End: 2021-03-20
  Administered 2021-03-19 – 2021-03-20 (×2): 1 via ORAL
  Filled 2021-03-17 (×2): qty 1

## 2021-03-17 MED ORDER — FENTANYL CITRATE (PF) 100 MCG/2ML IJ SOLN
INTRAMUSCULAR | Status: AC
  Filled 2021-03-17: qty 2

## 2021-03-17 MED ORDER — ACETAMINOPHEN 325 MG PO TABS
325.0000 mg | ORAL_TABLET | ORAL | Status: DC | PRN
Start: 2021-03-17 — End: 2021-03-17

## 2021-03-17 MED ORDER — CEFAZOLIN SODIUM-DEXTROSE 2-4 GM/100ML-% IV SOLN
2.0000 g | Freq: Four times a day (QID) | INTRAVENOUS | Status: AC
  Administered 2021-03-17 – 2021-03-18 (×3): 2 g via INTRAVENOUS
  Filled 2021-03-17 (×3): qty 100

## 2021-03-17 MED ORDER — MIDAZOLAM HCL 2 MG/2ML IJ SOLN
1.0000 mg | Freq: Once | INTRAMUSCULAR | Status: DC
  Filled 2021-03-17: qty 2

## 2021-03-17 MED ORDER — POLYETHYLENE GLYCOL 3350 17 G PO PACK: 17.0000 g | PACK | Freq: Every day | ORAL | Status: DC | PRN

## 2021-03-17 MED ORDER — ONDANSETRON HCL 4 MG PO TABS: 4.0000 mg | ORAL_TABLET | Freq: Four times a day (QID) | ORAL | Status: DC | PRN

## 2021-03-17 MED ORDER — LEVOTHYROXINE SODIUM 50 MCG PO TABS
50.0000 ug | ORAL_TABLET | Freq: Every day | ORAL | Status: DC
  Administered 2021-03-18 – 2021-03-20 (×3): 50 ug via ORAL
  Filled 2021-03-17 (×3): qty 1

## 2021-03-17 MED ORDER — PANTOPRAZOLE SODIUM 40 MG PO TBEC
40.0000 mg | DELAYED_RELEASE_TABLET | Freq: Every day | ORAL | Status: DC
  Administered 2021-03-18 – 2021-03-20 (×3): 40 mg via ORAL
  Filled 2021-03-17 (×3): qty 1

## 2021-03-17 MED ORDER — AMISULPRIDE (ANTIEMETIC) 5 MG/2ML IV SOLN: 10.0000 mg | Freq: Once | INTRAVENOUS | Status: DC | PRN

## 2021-03-17 MED ORDER — ACETAMINOPHEN 10 MG/ML IV SOLN: 1000.0000 mg | Freq: Once | INTRAVENOUS | Status: DC | PRN

## 2021-03-17 MED ORDER — ONDANSETRON HCL 4 MG/2ML IJ SOLN
INTRAMUSCULAR | Status: DC | PRN
  Administered 2021-03-17: 4 mg via INTRAVENOUS

## 2021-03-17 MED ORDER — VANCOMYCIN HCL 1000 MG IV SOLR
INTRAVENOUS | Status: AC
  Filled 2021-03-17: qty 1000

## 2021-03-17 MED ORDER — ACETAMINOPHEN 500 MG PO TABS
1000.0000 mg | ORAL_TABLET | Freq: Once | ORAL | Status: AC
  Administered 2021-03-17: 1000 mg via ORAL
  Filled 2021-03-17: qty 2

## 2021-03-17 SURGICAL SUPPLY — 60 items
BLADE SAW SAG 73X25 THK (BLADE) ×1
BLADE SAW SGTL 73X25 THK (BLADE) ×1 IMPLANT
CHLORAPREP W/TINT 26 (MISCELLANEOUS) ×4 IMPLANT
CLSR STERI-STRIP ANTIMIC 1/2X4 (GAUZE/BANDAGES/DRESSINGS) ×2 IMPLANT
COOLER ICEMAN CLASSIC (MISCELLANEOUS) IMPLANT
COVER BACK TABLE 60X90IN (DRAPES) ×2 IMPLANT
COVER SURGICAL LIGHT HANDLE (MISCELLANEOUS) ×2 IMPLANT
COVER WAND RF STERILE (DRAPES) ×2 IMPLANT
DRAPE C-ARM 42X120 X-RAY (DRAPES) IMPLANT
DRAPE INCISE IOBAN 66X45 STRL (DRAPES) ×2 IMPLANT
DRAPE ORTHO SPLIT 77X108 STRL (DRAPES) ×2
DRAPE SHEET LG 3/4 BI-LAMINATE (DRAPES) ×2 IMPLANT
DRAPE SURG ORHT 6 SPLT 77X108 (DRAPES) ×2 IMPLANT
DRAPE TOP 10253 STERILE (DRAPES) ×2 IMPLANT
DRESSING PEEL AND PLC PRVNA 13 (GAUZE/BANDAGES/DRESSINGS) IMPLANT
DRSG AQUACEL AG ADV 3.5X 6 (GAUZE/BANDAGES/DRESSINGS) ×2 IMPLANT
DRSG PEEL AND PLACE PREVENA 13 (GAUZE/BANDAGES/DRESSINGS) ×1
ELECT BLADE TIP CTD 4 INCH (ELECTRODE) ×2 IMPLANT
ELECT REM PT RETURN 15FT ADLT (MISCELLANEOUS) ×2 IMPLANT
GLOVE SRG 8 PF TXTR STRL LF DI (GLOVE) ×1 IMPLANT
GLOVE SURG ENC MOIS LTX SZ6.5 (GLOVE) ×4 IMPLANT
GLOVE SURG LTX SZ8 (GLOVE) ×4 IMPLANT
GLOVE SURG UNDER LTX SZ6.5 (GLOVE) ×2 IMPLANT
GLOVE SURG UNDER POLY LF SZ8 (GLOVE) ×1
GOWN STRL REUS W/TWL LRG LVL3 (GOWN DISPOSABLE) ×4 IMPLANT
HANDPIECE INTERPULSE COAX TIP (DISPOSABLE) ×1
HEAD HUMERAL LOW OS 48X18 (Shoulder) ×1 IMPLANT
HUMERAL HEAD AEQUALIS 48X18 (Shoulder) ×2 IMPLANT
KIT BASIN OR (CUSTOM PROCEDURE TRAY) ×2 IMPLANT
KIT DRSG PREVENA PLUS 7DAY 125 (MISCELLANEOUS) ×1 IMPLANT
KIT STABILIZATION SHOULDER (MISCELLANEOUS) ×2 IMPLANT
KIT TURNOVER KIT A (KITS) ×2 IMPLANT
MANIFOLD NEPTUNE II (INSTRUMENTS) ×2 IMPLANT
NEEDLE HYPO 25X1 1.5 SAFETY (NEEDLE) IMPLANT
NEEDLE MAYO CATGUT SZ4 (NEEDLE) IMPLANT
NS IRRIG 1000ML POUR BTL (IV SOLUTION) ×2 IMPLANT
PACK SHOULDER (CUSTOM PROCEDURE TRAY) ×2 IMPLANT
PAD COLD SHLDR WRAP-ON (PAD) IMPLANT
RESTRAINT HEAD UNIVERSAL NS (MISCELLANEOUS) ×2 IMPLANT
SET HNDPC FAN SPRY TIP SCT (DISPOSABLE) ×1 IMPLANT
SLING ULTRA II L (ORTHOPEDIC SUPPLIES) IMPLANT
SLING ULTRA III MED (ORTHOPEDIC SUPPLIES) IMPLANT
SMARTMIX MINI TOWER (MISCELLANEOUS)
SPONGE LAP 18X18 RF (DISPOSABLE) IMPLANT
STEM HUMERAL SZ4BX104 132.5D (Joint) ×2 IMPLANT
SUCTION FRAZIER HANDLE 12FR (TUBING) ×1
SUCTION TUBE FRAZIER 12FR DISP (TUBING) ×1 IMPLANT
SUT ETHIBOND 2 V 37 (SUTURE) ×2 IMPLANT
SUT ETHIBOND NAB CT1 #1 30IN (SUTURE) ×2 IMPLANT
SUT ETHILON 2 0 PS N (SUTURE) ×2 IMPLANT
SUT FIBERWIRE #5 38 CONV NDL (SUTURE)
SUT MNCRL AB 4-0 PS2 18 (SUTURE) ×2 IMPLANT
SUT VIC AB 0 CT1 36 (SUTURE) IMPLANT
SUT VIC AB 3-0 SH 27 (SUTURE) ×1
SUT VIC AB 3-0 SH 27X BRD (SUTURE) ×1 IMPLANT
SUTURE FIBERWR #5 38 CONV NDL (SUTURE) IMPLANT
TOWEL OR 17X26 10 PK STRL BLUE (TOWEL DISPOSABLE) ×2 IMPLANT
TOWER SMARTMIX MINI (MISCELLANEOUS) IMPLANT
TUBE SUCTION HIGH CAP CLEAR NV (SUCTIONS) ×2 IMPLANT
WATER STERILE IRR 1000ML POUR (IV SOLUTION) ×2 IMPLANT

## 2021-03-17 NOTE — Op Note (Signed)
Orthopaedic Surgery Operative Note (CSN: [id number])  Katelyn Jackson  07-17-1946 Date of Surgery: 03/17/2021   Diagnoses:  RIGHT HUMERUS AND SCAPULA FRACTURES is end-stage degenerative changes of the joint  Procedure: Right shoulder hemiarthroplasty Right shoulder synovectomy with incision and debridement and open biopsy Nonoperative management of scapular fracture   Operative Finding Successful completion of planned procedure.  Patient's medical situation was quite complex.  She has multiple comorbidities including cancer diagnosis as well as super morbid obesity with a BMI is measured in clinic and greater than 50.  Her shoulder had atypical degenerative changes with a scapular fracture that appeared chronic and essentially complete loss of the glenoid vault leaving the remnant of the scapula that was not unreconstructable.  Due to the atypical nature of her proximal humerus we felt that the differential diagnosis included infection versus cancer versus less likely a occult fracture that is gone on to malunion and AVN over time.  We did talk to the family about all these things.  There is no obvious purulence and the atypical nature of the humeral head may have suggested most likely option was indeed a malunion even though there is no significant injury or physician visits demonstrating an injury.  She did report a fall previously.  Her entire superior cuff was torn but were had some remnant of the subscapularis that was able to be repaired.    As there was no ability to really reconstruct the glenoid we recontoured the glenoid as it had significant atypical configuration but it is clear that the x-rays will look dislocated in the setting of this hemiarthroplasty.    Her joint preoperatively essentially had 20 degrees of forward flexion and was locked without internal extra rotation or forward flexion more than that 20 degrees.  We think that her hemiarthroplasty provide her some improvement  in pain that she may have the sensation of instability.  The other option would be resection arthroplasty.  Her scapular fracture was displaced but in this patient with multiple comorbidities and INR still 2.1 we did not feel that the fixation of her scapular fracture was appropriate.  It would likely go on to nonunion and could lead to further complication.  We did proceed with surgery even in light of her INR she been off Coumadin for more than 5 days and still had residual elevation of her labs.  We gave FFP and used TXA to try and minimize bleeding.  We felt that this is in the patient's best interest rather than canceling again.  This is extremely complex case with high-level medical decision making and we knew before the outset that her goals were limited.   Post-operative plan: The patient will be NWB in sling.  The patient will be will be admitted due to medical issues and pain control overnight.  DVT prophylaxis not indicated as patient already on full dose anticoagulant.  Pain control with PRN pain medication preferring oral medicines.  Follow up plan will be scheduled in approximately 7 days for incision check and XR.  No physical therapy till 1 month after surgery.  Implants: Tornier size 4B longstem with a 48 low offset head, we contoured scapular neck as the glenoid was unreconstructable.  Post-Op Diagnosis: Same Surgeons:Primary: Hiram Gash, MD Assistants:Caroline McBane PA-C Location: [facility] Anesthesia: General with Exparel Interscalene Antibiotics: Ancef 3g, Vancomycin 1000mg  locally Tourniquet time: None Estimated Blood Loss: 449 Complications: None Specimens: None Implants: Implant Name Type Inv. Item Serial No. Manufacturer Lot No. LRB No.  Used Action  HUMERAL HEAD AEQUALIS [id number] - [id number] Shoulder HUMERAL HEAD AEQUALIS [id number] [facility]  Right 1 Implanted  STEM HUMERAL [id number] 132.5D - [id number] Joint STEM HUMERAL [id number] 132.5D [id number]  [employer]  Right 1 Implanted    Indications for Surgery:   Katelyn Jackson is a 75 y.o. female with complex medical history, super morbid obesity and erosive changes around the proximal humerus concerning for infection versus lytic lesion versus atypical malunion and AVN.  Initial infection work-up was negative with a normal aspiration.  Her goals of surgery were limited as she had a scapular fracture and her body habitus as well as overall condition was such that she would be difficult to have a high level reconstruction performed.  We did not feel an extended time under anesthesia would be in the patient's best interest.  Benefits and risks of operative and nonoperative management were discussed prior to surgery with patient/guardian(s) and informed consent form was completed.  Infection and need for further surgery were discussed as was prosthetic stability and cuff issues.  We additionally specifically discussed risks of axillary nerve injury, infection, periprosthetic fracture, continued pain and longevity of implants prior to beginning procedure.      Procedure:   The patient was identified in the preoperative holding area where the surgical site was marked. Block placed by anesthesia with exparel.  The patient was taken to the OR where a procedural timeout was called and the above noted anesthesia was induced.  The patient was positioned beachchair on allen table with spider arm positioner.  Preoperative antibiotics were dosed.  The patient's right shoulder was prepped and draped in the usual sterile fashion.  A second preoperative timeout was called.       Began with deltopectoral approach going the skin sharp achieving careful hemostasis as we progressed.  We identified the cephalic vein and retracted laterally open the deltopectoral interval.  We open the clavipectoral fascia and noted a significant fluid collection.  We opened this fascia and at least 200 cc of fluid that was  serosanguineous was withdrawn and material was sent for culture.  This was likely a joint fluid collection there is no obvious purulence.  We examined the deep tissues noted there was a near complete tear of the subscapularis.  The superior cuff was completely torn.  Teres minor remnant was still attached however it was clearly not normal.  We are able to achieve hemostasis and do a peel of the anterior capsule to dislocate the joint.  What was left of the joint was extraordinarily atypical with a complete collapse of the humeral head and a V shaped structure which was notched and essentially had created a fixed angle construct with the glenoid.  This limited her forward motion to scapulothoracic motion and may have caused her scapular fracture.  We were able to dislocate the remnant of the humeral head from the remnant of the previous glenoid.  At that point were able to perform an osteotomy of the humeral head and sequentially broach with Tornier flex times to a size 4 long which obtained reasonable fixation.  Bone quality was poor.  At this point we placed a head protector and placed retractors to access what was left of the scapula/glenoid.  Axillary nerve tug test was really unable to be performed as there is significant subcoracoid scarring.  We stayed away from accident of instead.  We examined what was left of the scapular neck as the glenoid vault had essentially eroded.  There was a atypical appearance with a prominence anteriorly.  We used a rondure and curette to contour the glenoid perform a glenoid osteotomy to try and recreate something of a flat surface of the glenoid.  Once this was complete we irrigated copiously with 3 L and turned attention back to the humerus.  We withdrew our head protector and placed a trial head attempting to make something akin to a CTA head to try and provide the patient some stability of the joint.  A 48 low offset providers reasonable reconstruction of the  joint.  We passed FiberWire sutures x4 and used these through bone tunnels to eventually fix the subscapularis.  We irrigated again and placed our final implants.  We then repaired the remnant of subscapularis which was actually quite robust at the end of the case.  The joint was relatively stable though there was really not much in the way of a glenoid in order to get fixation.  We irrigated again and placed local vancomycin powder before closing multilayer fashion finally closing the superficial layer with nylon and placing a Prevena wound VAC to help with drainage.  There was a relatively high amount of generalized oozing in the setting of this patient's coagulopathic state.  Multiple culture sent for hold for 3 weeks for P acnes and pathology sent for specimen from the head.  Sling was placed patient woken from anesthesia and sent to the floor for observation overnight.   [name], PA-C, present and scrubbed throughout the case, critical for completion in a timely fashion, and for retraction, instrumentation, closure.

## 2021-03-17 NOTE — Anesthesia Postprocedure Evaluation (Signed)
Anesthesia Post Note  Patient: Katelyn Jackson  Procedure(s) Performed: SHOULDER HEMI-ARTHROPLASTY (Right Shoulder)     Patient location during evaluation: PACU Anesthesia Type: General Level of consciousness: awake and alert Pain management: pain level controlled Vital Signs Assessment: post-procedure vital signs reviewed and stable Respiratory status: spontaneous breathing, nonlabored ventilation, respiratory function stable and patient connected to nasal cannula oxygen Cardiovascular status: blood pressure returned to baseline and stable Postop Assessment: no apparent nausea or vomiting Anesthetic complications: no   No complications documented.  Last Vitals:  Vitals:   03/17/21 1745 03/17/21 1800  BP: (!) 143/79 132/68  Pulse: (!) 59 62  Resp: 19 17  Temp:    SpO2: 100% 100%    Last Pain:  Vitals:   03/17/21 1800  TempSrc:   PainSc: 0-No pain                 Effie Berkshire

## 2021-03-17 NOTE — Anesthesia Procedure Notes (Signed)
Procedure Name: Intubation Performed by: Cleda Daub, CRNA Pre-anesthesia Checklist: Patient identified, Emergency Drugs available, Suction available and Patient being monitored Patient Re-evaluated:Patient Re-evaluated prior to induction Oxygen Delivery Method: Circle system utilized Preoxygenation: Pre-oxygenation with 100% oxygen Induction Type: IV induction Ventilation: Mask ventilation without difficulty Laryngoscope Size: Mac and 3 Grade View: Grade I Tube type: Oral Tube size: 7.0 mm Number of attempts: 1 Airway Equipment and Method: Stylet and Oral airway Placement Confirmation: ETT inserted through vocal cords under direct vision,  positive ETCO2 and breath sounds checked- equal and bilateral Secured at: 20 cm Tube secured with: Tape Dental Injury: Teeth and Oropharynx as per pre-operative assessment

## 2021-03-17 NOTE — Progress Notes (Addendum)
1400Time out completed with Interpreter Nani Ravens D.) in room to talk with pt. As procedure completed.   \ AssistedDr. Smith Robert with right, ultrasound guided, interscalene  block. Side rails up, monitors on throughout procedure. See vital signs in flow sheet. Tolerated Procedure well.

## 2021-03-17 NOTE — Transfer of Care (Signed)
Immediate Anesthesia Transfer of Care Note  Patient: Katelyn Jackson  Procedure(s) Performed: SHOULDER HEMI-ARTHROPLASTY (Right Shoulder)  Patient Location: PACU  Anesthesia Type:GA combined with regional for post-op pain  Level of Consciousness: awake, alert , oriented and patient cooperative  Airway & Oxygen Therapy: Patient Spontanous Breathing and Patient connected to face mask oxygen  Post-op Assessment: Report given to RN and Post -op Vital signs reviewed and stable  Post vital signs: Reviewed and stable  Last Vitals:  Vitals Value Taken Time  BP 140/64 03/17/21 1649  Temp 36.3 C 03/17/21 1649  Pulse 64 03/17/21 1656  Resp 18 03/17/21 1656  SpO2 100 % 03/17/21 1656  Vitals shown include unvalidated device data.  Last Pain:  Vitals:   03/17/21 1414  TempSrc:   PainSc: 0-No pain         Complications: No complications documented.

## 2021-03-17 NOTE — Anesthesia Preprocedure Evaluation (Addendum)
Anesthesia Evaluation  Patient identified by MRN, date of birth, ID band Patient awake    Reviewed: Allergy & Precautions, NPO status , Patient's Chart, lab work & pertinent test results  Airway Mallampati: I  TM Distance: <3 FB Neck ROM: Full    Dental  (+) Edentulous Upper, Edentulous Lower   Pulmonary pneumonia, resolved,    breath sounds clear to auscultation       Cardiovascular hypertension, Pt. on home beta blockers + dysrhythmias Atrial Fibrillation + pacemaker  Rhythm:Irregular Rate:Normal     Neuro/Psych  Headaches, negative psych ROS   GI/Hepatic Neg liver ROS, GERD  Medicated,  Endo/Other  Hypothyroidism   Renal/GU Renal disease     Musculoskeletal  (+) Arthritis ,   Abdominal (+) + obese,   Peds  Hematology   Anesthesia Other Findings - Severe Pulm HTN  Reproductive/Obstetrics                            Anesthesia Physical Anesthesia Plan  ASA: III  Anesthesia Plan: General   Post-op Pain Management: GA combined w/ Regional for post-op pain   Induction: Intravenous  PONV Risk Score and Plan: 4 or greater and Ondansetron, Dexamethasone and Treatment may vary due to age or medical condition  Airway Management Planned: Oral ETT  Additional Equipment: None  Intra-op Plan:   Post-operative Plan: Extubation in OR  Informed Consent: I have reviewed the patients History and Physical, chart, labs and discussed the procedure including the risks, benefits and alternatives for the proposed anesthesia with the patient or authorized representative who has indicated his/her understanding and acceptance.     Dental advisory given and Interpreter used for interveiw  Plan Discussed with: CRNA  Anesthesia Plan Comments: (Magnet for pacemaker  Echo:  LEFT VENTRICLE The left ventricle is mildly dilated. There is borderline concentric left ventricular hypertrophy. LV ejection  fraction = 50-55%. Left ventricular systolic function is low normal. Left ventricular filling pattern is indeterminate. Unable to fully assess LV regional wall motion. - RIGHT VENTRICLE The right ventricle is borderline dilated. The right ventricular systolic function is borderline reduced. LEFT ATRIUM The left atrium is moderately to severely dilated. RIGHT ATRIUM The right atrium is moderate to severely dilated. A patent foramen ovale is present. Injection of agitated saline showed mild right-to- left shunt. - AORTIC VALVE The aortic valve is trileaflet. There is aortic valve sclerosis. There is trace aortic regurgitation. - MITRAL VALVE The mitral valve leaflets appear normal. There is moderate mitral regurgitation. The regurgitant jet is centrally-directed. - TRICUSPID VALVE Structurally normal tricuspid valve. There is moderate tricuspid regurgitation. Moderate pulmonary hypertension. Estimated right ventricular systolic pressure is 51 mmHg. - PULMONIC VALVE Structurally normal pulmonic valve. Trace to mild pulmonic valvular regurgitation. - - VENOUS The IVC is _ dialted with an abnormal collapsibility index, this suggestive of increased right atrial pressure. - EFFUSION There is no pericardial effusion. - -)      Anesthesia Quick Evaluation

## 2021-03-17 NOTE — Anesthesia Procedure Notes (Signed)
Anesthesia Regional Block: Interscalene brachial plexus block   Pre-Anesthetic Checklist: ,, timeout performed, Correct Patient, Correct Site, Correct Laterality, Correct Procedure, Correct Position, site marked, Risks and benefits discussed,  Surgical consent,  Pre-op evaluation,  At surgeon's request and post-op pain management  Laterality: Right  Prep: chloraprep       Needles:  Injection technique: Single-shot  Needle Type: Echogenic Stimulator Needle     Needle Length: 9cm  Needle Gauge: 21     Additional Needles:   Procedures:,,,, ultrasound used (permanent image in chart),,,,  Narrative:  Start time: 03/17/2021 2:10 PM End time: 03/17/2021 2:15 PM Injection made incrementally with aspirations every 5 mL.  Performed by: Personally  Anesthesiologist: Effie Berkshire, MD  Additional Notes: Patient tolerated the procedure well. Local anesthetic introduced in an incremental fashion under minimal resistance after negative aspirations. No paresthesias were elicited. After completion of the procedure, no acute issues were identified and patient continued to be monitored by RN.

## 2021-03-17 NOTE — Progress Notes (Signed)
Dr.Hollis requested the pacemaker rep be called.  Spoke to the [employer] and he states using the magnet should get asynchronous pacing and that should be fine, there is no rep available to come in right now.

## 2021-03-17 NOTE — Progress Notes (Signed)
Medical screening examination/treatment/procedure(s) were performed by non-physician practitioner and as supervising physician I was immediately available for consultation/collaboration. I agree with above. Zaylin Pistilli, MD  

## 2021-03-17 NOTE — Interval H&P Note (Signed)
History and Physical Interval Note:  03/17/2021 11:30 AM  Katelyn Jackson  has presented today for surgery, with the diagnosis of right humerus and scapula fracture.  The various methods of treatment have been discussed with the patient and family. After consideration of risks, benefits and other options for treatment, the patient has consented to  Procedure(s): TOTAL SHOULDER ARTHROPLASTY (Right) as a surgical intervention.  The patient's history has been reviewed, patient examined, no change in status, stable for surgery.  I have reviewed the patient's chart and labs.  Questions were answered to the patient's satisfaction.     Hiram Gash

## 2021-03-18 ENCOUNTER — Encounter (HOSPITAL_COMMUNITY): Payer: Self-pay | Admitting: Orthopaedic Surgery

## 2021-03-18 ENCOUNTER — Observation Stay (HOSPITAL_COMMUNITY): Payer: Medicare HMO

## 2021-03-18 DIAGNOSIS — I272 Pulmonary hypertension, unspecified: Secondary | ICD-10-CM | POA: Diagnosis present

## 2021-03-18 DIAGNOSIS — S42301A Unspecified fracture of shaft of humerus, right arm, initial encounter for closed fracture: Secondary | ICD-10-CM | POA: Diagnosis present

## 2021-03-18 DIAGNOSIS — Z888 Allergy status to other drugs, medicaments and biological substances status: Secondary | ICD-10-CM | POA: Diagnosis not present

## 2021-03-18 DIAGNOSIS — Z880 Allergy status to penicillin: Secondary | ICD-10-CM | POA: Diagnosis not present

## 2021-03-18 DIAGNOSIS — Z950 Presence of cardiac pacemaker: Secondary | ICD-10-CM | POA: Diagnosis not present

## 2021-03-18 DIAGNOSIS — I482 Chronic atrial fibrillation, unspecified: Secondary | ICD-10-CM | POA: Diagnosis not present

## 2021-03-18 DIAGNOSIS — I129 Hypertensive chronic kidney disease with stage 1 through stage 4 chronic kidney disease, or unspecified chronic kidney disease: Secondary | ICD-10-CM | POA: Diagnosis present

## 2021-03-18 DIAGNOSIS — J90 Pleural effusion, not elsewhere classified: Secondary | ICD-10-CM | POA: Diagnosis not present

## 2021-03-18 DIAGNOSIS — Z96611 Presence of right artificial shoulder joint: Secondary | ICD-10-CM | POA: Diagnosis not present

## 2021-03-18 DIAGNOSIS — D509 Iron deficiency anemia, unspecified: Secondary | ICD-10-CM | POA: Diagnosis present

## 2021-03-18 DIAGNOSIS — N184 Chronic kidney disease, stage 4 (severe): Secondary | ICD-10-CM | POA: Diagnosis present

## 2021-03-18 DIAGNOSIS — D638 Anemia in other chronic diseases classified elsewhere: Secondary | ICD-10-CM | POA: Diagnosis not present

## 2021-03-18 DIAGNOSIS — Z79899 Other long term (current) drug therapy: Secondary | ICD-10-CM | POA: Diagnosis not present

## 2021-03-18 DIAGNOSIS — Z7901 Long term (current) use of anticoagulants: Secondary | ICD-10-CM | POA: Diagnosis not present

## 2021-03-18 DIAGNOSIS — Z853 Personal history of malignant neoplasm of breast: Secondary | ICD-10-CM | POA: Diagnosis not present

## 2021-03-18 DIAGNOSIS — E785 Hyperlipidemia, unspecified: Secondary | ICD-10-CM | POA: Diagnosis present

## 2021-03-18 DIAGNOSIS — X58XXXA Exposure to other specified factors, initial encounter: Secondary | ICD-10-CM | POA: Diagnosis present

## 2021-03-18 DIAGNOSIS — Z79890 Hormone replacement therapy (postmenopausal): Secondary | ICD-10-CM | POA: Diagnosis not present

## 2021-03-18 DIAGNOSIS — D62 Acute posthemorrhagic anemia: Secondary | ICD-10-CM | POA: Diagnosis present

## 2021-03-18 DIAGNOSIS — Z20822 Contact with and (suspected) exposure to covid-19: Secondary | ICD-10-CM | POA: Diagnosis present

## 2021-03-18 DIAGNOSIS — Z6841 Body Mass Index (BMI) 40.0 and over, adult: Secondary | ICD-10-CM | POA: Diagnosis not present

## 2021-03-18 DIAGNOSIS — S42123A Displaced fracture of acromial process, unspecified shoulder, initial encounter for closed fracture: Secondary | ICD-10-CM | POA: Diagnosis present

## 2021-03-18 DIAGNOSIS — R0602 Shortness of breath: Secondary | ICD-10-CM

## 2021-03-18 DIAGNOSIS — Z9841 Cataract extraction status, right eye: Secondary | ICD-10-CM | POA: Diagnosis not present

## 2021-03-18 DIAGNOSIS — K219 Gastro-esophageal reflux disease without esophagitis: Secondary | ICD-10-CM | POA: Diagnosis present

## 2021-03-18 DIAGNOSIS — Z923 Personal history of irradiation: Secondary | ICD-10-CM | POA: Diagnosis not present

## 2021-03-18 DIAGNOSIS — I517 Cardiomegaly: Secondary | ICD-10-CM | POA: Diagnosis not present

## 2021-03-18 DIAGNOSIS — I4821 Permanent atrial fibrillation: Secondary | ICD-10-CM | POA: Diagnosis present

## 2021-03-18 DIAGNOSIS — Z961 Presence of intraocular lens: Secondary | ICD-10-CM | POA: Diagnosis present

## 2021-03-18 DIAGNOSIS — Z9842 Cataract extraction status, left eye: Secondary | ICD-10-CM | POA: Diagnosis not present

## 2021-03-18 DIAGNOSIS — I5023 Acute on chronic systolic (congestive) heart failure: Secondary | ICD-10-CM | POA: Diagnosis not present

## 2021-03-18 DIAGNOSIS — Z823 Family history of stroke: Secondary | ICD-10-CM | POA: Diagnosis not present

## 2021-03-18 LAB — PREPARE FRESH FROZEN PLASMA: Unit division: 0

## 2021-03-18 LAB — BLOOD GAS, ARTERIAL
Acid-Base Excess: 2.2 mmol/L — ABNORMAL HIGH (ref 0.0–2.0)
Bicarbonate: 26.9 mmol/L (ref 20.0–28.0)
Drawn by: 25788
FIO2: 28
O2 Content: 2 L/min
O2 Saturation: 96.7 %
Patient temperature: 98.6
pCO2 arterial: 45.8 mmHg (ref 32.0–48.0)
pH, Arterial: 7.388 (ref 7.350–7.450)
pO2, Arterial: 91.1 mmHg (ref 83.0–108.0)

## 2021-03-18 LAB — BPAM FFP
Blood Product Expiration Date: 202205162359
Blood Product Expiration Date: 202205162359
ISSUE DATE / TIME: 202205111616
ISSUE DATE / TIME: 202205111730
Unit Type and Rh: 5100
Unit Type and Rh: 5100

## 2021-03-18 LAB — PROTIME-INR
INR: 2.2 — ABNORMAL HIGH (ref 0.8–1.2)
Prothrombin Time: 24.2 seconds — ABNORMAL HIGH (ref 11.4–15.2)

## 2021-03-18 LAB — CBC
HCT: 27.2 % — ABNORMAL LOW (ref 36.0–46.0)
Hemoglobin: 7.8 g/dL — ABNORMAL LOW (ref 12.0–15.0)
MCH: 26.6 pg (ref 26.0–34.0)
MCHC: 28.7 g/dL — ABNORMAL LOW (ref 30.0–36.0)
MCV: 92.8 fL (ref 80.0–100.0)
Platelets: 167 10*3/uL (ref 150–400)
RBC: 2.93 MIL/uL — ABNORMAL LOW (ref 3.87–5.11)
RDW: 16.6 % — ABNORMAL HIGH (ref 11.5–15.5)
WBC: 7.8 10*3/uL (ref 4.0–10.5)
nRBC: 0 % (ref 0.0–0.2)

## 2021-03-18 LAB — BASIC METABOLIC PANEL
Anion gap: 9 (ref 5–15)
BUN: 18 mg/dL (ref 8–23)
CO2: 26 mmol/L (ref 22–32)
Calcium: 9.5 mg/dL (ref 8.9–10.3)
Chloride: 101 mmol/L (ref 98–111)
Creatinine, Ser: 1.74 mg/dL — ABNORMAL HIGH (ref 0.44–1.00)
GFR, Estimated: 30 mL/min — ABNORMAL LOW (ref >60)
Glucose, Bld: 148 mg/dL — ABNORMAL HIGH (ref 70–99)
Potassium: 4.3 mmol/L (ref 3.5–5.1)
Sodium: 136 mmol/L (ref 135–145)

## 2021-03-18 LAB — HEMOGLOBIN AND HEMATOCRIT, BLOOD
HCT: 21 % — ABNORMAL LOW (ref 36.0–46.0)
Hemoglobin: 5.9 g/dL — CL (ref 12.0–15.0)

## 2021-03-18 LAB — PREPARE RBC (CROSSMATCH)

## 2021-03-18 LAB — BRAIN NATRIURETIC PEPTIDE: B Natriuretic Peptide: 329.8 pg/mL — ABNORMAL HIGH (ref 0.0–100.0)

## 2021-03-18 MED ORDER — WARFARIN SODIUM 2.5 MG PO TABS: 2.5000 mg | ORAL_TABLET | Freq: Every day | ORAL | Status: DC

## 2021-03-18 MED ORDER — POTASSIUM CHLORIDE CRYS ER 10 MEQ PO TBCR
10.0000 meq | EXTENDED_RELEASE_TABLET | Freq: Every day | ORAL | Status: DC
  Administered 2021-03-18 – 2021-03-20 (×3): 10 meq via ORAL
  Filled 2021-03-18 (×3): qty 1

## 2021-03-18 MED ORDER — WARFARIN - PHARMACIST DOSING INPATIENT: Freq: Every day | Status: DC

## 2021-03-18 MED ORDER — FUROSEMIDE 10 MG/ML IJ SOLN
40.0000 mg | Freq: Once | INTRAMUSCULAR | Status: AC
  Administered 2021-03-18: 40 mg via INTRAVENOUS
  Filled 2021-03-18: qty 4

## 2021-03-18 MED ORDER — SODIUM CHLORIDE 0.9% IV SOLUTION: Freq: Once | INTRAVENOUS | Status: DC

## 2021-03-18 MED ORDER — ENOXAPARIN SODIUM 120 MG/0.8ML IJ SOSY
120.0000 mg | PREFILLED_SYRINGE | INTRAMUSCULAR | Status: DC
  Filled 2021-03-18: qty 0.8

## 2021-03-18 NOTE — Progress Notes (Addendum)
VAST RN approached by lab tech regarding this patient. Patient had shoulder surgery of right arm yesterday and has 2 PIV's in left arm which also has a pink restricted extremity band. Physician has ordered labwork. Advised Angelina to come with me to patient's bedside to assess situation. Spoke with patient's son who speaks [place]. He stated patient has had breast cancer with lymph node removal on the left. Marjorie Smolder advised that lab tech CANNOT stick an extremity with a pink band even with MD approval (pink band must be removed before lab tech is allowed to stick extremity). VAST RN noted PIV in left ac saline locked; assessed IV and was able to obtain blood return; 6 mLs waste removed and trashed prior to blood collection for labs. Blood handed off to [place] with lab for processing.  Educated patient's nurse, Delsa Sale to keep a close watch on patient's left arm and IV sites as this arm is at increased risk for lymphedema d/t previous lymph node removal. Physician notified as well.

## 2021-03-18 NOTE — H&P (Addendum)
Medical Consultation  Katelyn Jackson WFU:[id number] DOB: 11-Oct-1946 DOA: 03/17/2021 PCP: Cassandria Anger, MD   Requesting physician: Dr. Griffin Basil  Date of consultation: 03/18/21 Reason for consultation: Medical Mgmt  Impression/Recommendations Dyspnea     - she reports being short of breath only after the surgery     - check CXR, ABG, H&H     - d-dimer would not be useful here as we would expect it to be high s/p surgery     - check V/Q scan if the above are non-informative     - she was therapeutic on her coumadin at admission     - O2 as needed     - her lung exam is ok. She is moving good air.     - UPDATE: CXR w/ pulm vasc congestion; ABG ok; giving OT lasix 40mg  IV, checking echo, follow     - UPDATE: H&H has come back as 5.9. This is a drop from this morning. Hold coumadin (I have notified pharmacy). Transfuse 2 units pRBCs slowly. She got FFP yesterday, but doesn't seem to be TACO or TRALI. Family updated with plan.    Permanent A fib on coumadin s/p PPM     - continue coreg     - pharmacy is dosing coumadin; see below  Hypothyroidism     - continue home synthroid  CKD4      - patient/son report Hx of CKD 4 on demedex      - she is not far off of baseline right now and she gets the demedex T,Th, Sat. Can continue for now. Follow SCr  Iron deficiency anemia Acute blood loss anemia     - INR 2.1 -> 2.2 (transfused 2 units FFP on 03/17/21); her Hgb was 9.4 -> 7.8, some drop to be expected after surgery     - rpt H&H as she is dyspnic this morning; if she is continuing to drop, will transfuse pRBCs     - can continue her iron replacement     - UPDATE: H&H has come back as 5.9. This is a drop from this morning. Hold coumadin (I have notified pharmacy). Transfuse 2 units pRBCs slowly. She got FFP yesterday, but doesn't seem to be TACO or TRALI.    HLD     - continue home statin  Hx of breast CA     - s/p surgery and radiation     - continue outpt follow up  Morbid  obesity     - counseled on diet, lifestyle changes  Right shoulder pain s/p hemi-arthroplasty     - per primary team; including pain control, PT/OT arrangement  TRH will follow-up again tomorrow. Please contact me if I can be of assistance in the meanwhile. Thank you for this consultation.  Chief Complaint: right shoulder pain  HPI:  Katelyn Jackson is a 75 y.o. female with medical history significant of permanent a fib, HTN, HLD, hypothyroidism, chronic lymphedema. Presenting with right shoulder pain. She has had multiple falls in the last several months resulting in right shoulder pain. She has had limited mobility in that arm and has had a radiating burning pain down that arm. She was evaluated by orthopedics who recommended surgery. She presented for that surgery and it was completed 03/17/21. Given her multiple co-morbidities, TRH was consulted to help with medical management.   Mrs Lacks reports that her chronic conditions include a fib, hypothyroidism, CKD4, HLD, HTN, pacemaker placement, and a history of breast cancer. She  reports compliance with all her medications, including her coumadin. She reports regular follow up with her PCP.   She states she feels her normal self right now except for some shortness of breath that started after surgery. She denies should pain, chest pain, palpitations, feelings of lightheadedness or dizziness.   Review of Systems:  Denies CP, palpitations, syncopal episodes, N/V/D, abdominal pain, fever, sick contacts. Reports dyspnea.   Past Medical History:  Diagnosis Date  . Arthritis   . Cancer [facility])    breast cancer  . Chronic atrial fibrillation ([place])   . Chronic kidney disease    sees Dr Florene Glen  . Cramps, muscle, general   . Dyspnea on exertion   . Dysrhythmia    a-fib,   . GERD (gastroesophageal reflux disease)   . Headache   . Hyperlipidemia   . Hypertension   . Hypothyroidism   . Lymphedema   . Moderate to severe pulmonary  hypertension ([place])   . Obesity   . Personal history of radiation therapy   . Pneumonia 12/2020  . Presence of permanent cardiac pacemaker 01/21/2021   for bradycardia   . Syncope 12/2020   needed a pacemaker   Past Surgical History:  Procedure Laterality Date  . APPENDECTOMY    . BREAST BIOPSY Left 2018  . BREAST LUMPECTOMY Left 04/04/2017   x3  . BREAST LUMPECTOMY WITH NEEDLE LOCALIZATION AND AXILLARY SENTINEL LYMPH NODE BX Left 04/04/2017   Procedure: BREAST LUMPECTOMY WITH NEEDLE LOCALIZATION x3 AND AXILLARY SENTINEL LYMPH NODE BX;  Surgeon: [name], MD;  Location: [place];  Service: General;  Laterality: Left;  . CATARACT EXTRACTION W/ INTRAOCULAR LENS  IMPLANT, BILATERAL Bilateral 2018  . COLONOSCOPY    . EYE SURGERY    . GLAUCOMA SURGERY Bilateral 2018  . INSERT / REPLACE / REMOVE PACEMAKER  01/21/2021  . RE-EXCISION OF BREAST CANCER,SUPERIOR MARGINS Left 04/26/2017   Procedure: RE-EXCISION OF LEFT BREAST CANCER;  Surgeon: [name], MD;  Location: [place];  Service: General;  Laterality: Left;   Social History:  reports that she has never smoked. She has never used smokeless tobacco. She reports that she does not drink alcohol and does not use drugs.  Allergies  Allergen Reactions  . Chlorhexidine Gluconate Hives  . Penicillins Itching    Has patient had a PCN reaction causing immediate rash, facial/tongue/throat swelling, SOB or lightheadedness with hypotension: no Has patient had a PCN reaction causing severe rash involving mucus membranes or skin necrosis: No Has patient had a PCN reaction that required hospitalization: No Has patient had a PCN reaction occurring within the last 10 years: No If all of the above answers are "NO", then may proceed with Cephalosporin use.  Tolerated Cephalosporin Date: 03/17/21.    Family History  Problem Relation Age of Onset  . CAD Mother 43  . Stroke Mother 14       hemorr CVA  . COPD Father 68  . Cancer Neg Hx     Prior  to Admission medications   Medication Sig Start Date End Date Taking? Authorizing Provider  carvedilol (COREG) 6.25 MG tablet Take 1 tablet (6.25 mg total) by mouth 2 (two) times daily with a meal. Overdue for Annual appt must see provider for future refills 10/08/20  Yes Plotnikov, [name], MD  enoxaparin (LOVENOX) 120 MG/0.8ML injection Inject 0.8 mLs (120 mg total) into the skin daily for 7 doses. 03/11/21 03/18/21 Yes Plotnikov, [name], MD  exemestane (AROMASIN) 25 MG tablet Take  1 tablet (25 mg total) by mouth daily after breakfast. 10/08/20  Yes Plotnikov, [name], MD  ferrous sulfate 325 (65 FE) MG tablet Take 325 mg by mouth daily with breakfast.   Yes [provider]  levothyroxine (SYNTHROID) 50 MCG tablet Take 1 tablet (50 mcg total) by mouth daily before breakfast. Overdue for Annual appt must see provider for future refills 02/09/21  Yes Plotnikov, [name], MD  magnesium oxide (MAG-OX) 400 MG tablet Take 400 mg by mouth 2 (two) times daily.   Yes [provider]  omeprazole (PRILOSEC) 40 MG capsule Take 1 capsule (40 mg total) by mouth daily. Overdue for Annual appt must see provider for future refills Patient taking differently: Take 40 mg by mouth daily as needed (acid reflux). Overdue for Annual appt must see provider for future refills 10/08/20  Yes Plotnikov, [name], MD  polyethylene glycol powder (GLYCOLAX/MIRALAX) powder mix 1 capful (17 grams) IN 8 ounces OF liquid EVERY DAY Patient taking differently: Take 17 g by mouth daily. mix 1 capful (17 grams) IN 8 ounces OF liquid EVERY DAY 11/05/18  Yes Plotnikov, [name], MD  potassium chloride (KLOR-CON) 8 MEQ tablet Take 1 tablet (8 mEq total) by mouth daily. 10/08/20  Yes Plotnikov, [name], MD  torsemide (DEMADEX) 20 MG tablet Take 20 mg by mouth 3 (three) times a week.   Yes [provider]  traMADol (ULTRAM) 50 MG tablet Take 1 tablet (50 mg total) by mouth every 12 (twelve) hours. Patient taking  differently: Take 50 mg by mouth every 12 (twelve) hours as needed for severe pain. 02/09/21  Yes Plotnikov, [name], MD  allopurinol (ZYLOPRIM) 100 MG tablet Take 0.5 tablets (50 mg total) by mouth daily. 10/08/20   Plotnikov, [name], MD  Cholecalciferol (VITAMIN D3) 2000 units capsule Take 1 capsule (2,000 Units total) by mouth daily. 07/11/17   Plotnikov, [name], MD  Cyanocobalamin (VITAMIN B-12 PO) Take 2,000 mcg by mouth daily.    [provider]  diclofenac Sodium (VOLTAREN) 1 % GEL APPLY 4 GRAMS FOUR TIMES DAILY AS NEEDED Patient taking differently: Apply 4 g topically 4 (four) times daily as needed (pain). APPLY 4 GRAMS FOUR TIMES DAILY AS NEEDED 10/08/20   Plotnikov, [name], MD  lipase/protease/amylase (CREON) 12000-38000 units CPEP capsule Take 1 capsule (12,000 Units total) by mouth daily as needed (stomach problems). 10/08/20   Plotnikov, [name], MD  lovastatin (MEVACOR) 20 MG tablet Take 1 tablet (20 mg total) by mouth daily. 10/08/20   Plotnikov, [name], MD  methocarbamol (ROBAXIN) 500 MG tablet Take 1 tablet (500 mg total) by mouth 2 (two) times daily. Patient taking differently: Take 500 mg by mouth 2 (two) times daily as needed for muscle spasms. 12/22/20   Cardama, Grayce Sessions, MD  OVER THE COUNTER MEDICATION Apply 1 application topically daily as needed (apply to buttocks  as needed for irritaiton). Intensive Skin Care Therapy    [provider]  warfarin (COUMADIN) 5 MG tablet Take 1 tablet daily except take 1/2 tablet on Wed and Sat or Take as directed by anticoagulation clinic Patient taking differently: Take 2.5 mg by mouth daily. Take as directed by anticoagulation clinic 10/08/20   Plotnikov, [name], MD   Physical Exam: Blood pressure (!) 146/81, pulse (!) 58, temperature 98.3 F (36.8 C), temperature source Oral, resp. rate 18, height 5\' 1"  (1.549 m), weight 114.8 kg, SpO2 99 %. Vitals:   03/18/21 0042 03/18/21 0605  BP: [name])  142/80 (!) 146/81   Pulse: 66 (!) 58  Resp: 16 18  Temp: 98.1 F (36.7 C) 98.3 F (36.8 C)  SpO2: 99% 99%    General: 75 y.o. female resting in bed in NAD Eyes: PERRL, normal sclera ENMT: Nares patent w/o discharge, orophaynx clear, dentition normal, ears w/o discharge/lesions/ulcers Neck: Supple, trachea midline Cardiovascular: RRR, +S1, S2, no g/r, 2/6 SEM, equal pulses throughout Respiratory: CTABL, no w/r/r, increased WOB on 2L GI: BS+, NDNT, no masses noted, no organomegaly noted MSK: No c/c; 4+ BLE edema (chronic), BUE edema (chronic) Skin: No rashes, bruises, ulcerations noted Neuro: A&O x 3, no focal deficits Psyc: Appropriate interaction and affect, calm/cooperative  Labs on Admission:  Basic Metabolic Panel: Recent Labs  Lab 03/17/21 1703 03/18/21 0324  NA 138 136  K 3.9 4.3  CL 102 101  CO2 28 26  GLUCOSE 111* 148*  BUN 17 18  CREATININE 1.67* 1.74*  CALCIUM 9.6 9.5   Liver Function Tests: No results for input(s): AST, ALT, ALKPHOS, BILITOT, PROT, ALBUMIN in the last 168 hours. No results for input(s): LIPASE, AMYLASE in the last 168 hours. No results for input(s): AMMONIA in the last 168 hours. CBC: Recent Labs  Lab 03/17/21 1205 03/18/21 0324  WBC 4.9 7.8  HGB 9.4* 7.8*  HCT 32.8* 27.2*  MCV 92.4 92.8  PLT 247 167   Cardiac Enzymes: No results for input(s): CKTOTAL, CKMB, CKMBINDEX, TROPONINI in the last 168 hours. BNP: Invalid input(s): POCBNP CBG: No results for input(s): GLUCAP in the last 168 hours.  Radiological Exams on Admission: DG Shoulder Right Port  Result Date: 03/17/2021 CLINICAL DATA:  Status post right shoulder replacement. EXAM: PORTABLE RIGHT SHOULDER COMPARISON:  Preoperative CT 02/17/2021 FINDINGS: Right humeral head arthroplasty. The arthroplasty appears slightly inferiorly located with respect to the glenoid. Fracture at the base of the a chromium was better appreciated on prior CT. Presumed wound VAC in place. IMPRESSION: 1. Right humeral  head arthroplasty. The arthroplasty appears slightly inferiorly located with respect to the glenoid. 2. Fracture at the base of the acromion better appreciated on prior CT. Electronically Signed   By: Keith Rake M.D.   On: 03/17/2021 17:33   Time spent: 60 minutes  [facility]  If 7PM-7AM, please contact night-coverage [email or web address] 03/18/2021, 9:31 AM

## 2021-03-18 NOTE — Progress Notes (Signed)
   ORTHOPAEDIC PROGRESS NOTE  s/p Procedure(s): SHOULDER HEMI-ARTHROPLASTY  SUBJECTIVE: Translation provided by the translator service at [phone]. Reports no pain in the right shoulder. Did have a headache over night, but this has improved. Is requiring use of nasal cannula. Does not use home O2. Feels slightly short of breath. No cough, chest pain, no fevers.   OBJECTIVE: PE: General: resting in hospital bed, NAD RUE: prevena wound vac in place with good seal. No drainage noted in the canister. Was able to appreciate a flicker of deltoid motor function. However patient shook her head no when testing axillary nerve sensation. She does endorse distal sensation. + motor function AIN/PIN distribution. Difficulty with ulnar nerve motor function. Chronic lymphedema of RUE. Warm well perfused hand.  Vitals:   03/18/21 0042 03/18/21 0605  BP: (!) 142/80 (!) 146/81  Pulse: 66 (!) 58  Resp: 16 18  Temp: 98.1 F (36.7 C) 98.3 F (36.8 C)  SpO2: 99% 99%     ASSESSMENT: Katelyn Jackson is a 75 y.o. female  S/p Right shoulder hemiarthroplasty, synovectomy on 5/11 - POD#1  PLAN: Weightbearing: NWB RUE Insicional and dressing care: Leave wound vac in place Orthopedic device(s): Prevena wound vac, Sling Showering: Hold for now VTE prophylaxis: On chronic anticoagulation Per coumadin clinic note, " Pain control: PRN pain medications, preferring oral medications Follow - up plan: Next week in office for removal of wound vac Contact information: Dr. Ophelia Charter, [name] PA-C, After hours and holidays please check [email or web address] for group call information for [facility]  Dispo: TBD. Patient has multiple co-morbidities - including chronic A.Fib on Coumadin, presence of a pacemaker, pulmonary HTN, CKD, hx of breast cancer and radiation. She is at a high risk for complications post-operatively. Will request hospitalist consultation for medical co-management. OT evaluation pending.  Will call her son to update him on the plan, since he was not at the bedside this morning.   [name], PA-C 03/18/2021

## 2021-03-18 NOTE — Progress Notes (Addendum)
ANTICOAGULATION CONSULT NOTE - Initial Consult  Pharmacy Consult for warfarin Indication: atrial fibrillation  Allergies  Allergen Reactions  . Chlorhexidine Gluconate Hives  . Penicillins Itching    Has patient had a PCN reaction causing immediate rash, facial/tongue/throat swelling, SOB or lightheadedness with hypotension: no Has patient had a PCN reaction causing severe rash involving mucus membranes or skin necrosis: No Has patient had a PCN reaction that required hospitalization: No Has patient had a PCN reaction occurring within the last 10 years: No If all of the above answers are "NO", then may proceed with Cephalosporin use.  Tolerated Cephalosporin Date: 03/17/21.     Patient Measurements: Height: 5\' 1"  (154.9 cm) Weight: 114.8 kg (253 lb) IBW/kg (Calculated) : 47.8 Heparin Dosing Weight:   Vital Signs: Temp: 98.2 F (36.8 C) (05/12 0939) Temp Source: Oral (05/12 0605) BP: 141/73 (05/12 0939) Pulse Rate: 52 (05/12 0939)  Labs: Recent Labs    03/17/21 1205 03/17/21 1703 03/18/21 0324  HGB 9.4*  --  7.8*  HCT 32.8*  --  27.2*  PLT 247  --  167  APTT 29  --   --   LABPROT 23.8*  --  24.2*  INR 2.1*  --  2.2*  CREATININE  --  1.67* 1.74*    Estimated Creatinine Clearance: 32.9 mL/min (A) (by C-G formula based on SCr of 1.74 mg/dL (H)).   Medical History: Past Medical History:  Diagnosis Date  . Arthritis   . Cancer [facility])    breast cancer  . Chronic atrial fibrillation ([place])   . Chronic kidney disease    sees Dr Florene Glen  . Cramps, muscle, general   . Dyspnea on exertion   . Dysrhythmia    a-fib,   . GERD (gastroesophageal reflux disease)   . Headache   . Hyperlipidemia   . Hypertension   . Hypothyroidism   . Lymphedema   . Moderate to severe pulmonary hypertension ([place])   . Obesity   . Personal history of radiation therapy   . Pneumonia 12/2020  . Presence of permanent cardiac pacemaker 01/21/2021   for bradycardia   . Syncope 12/2020    needed a pacemaker    Medications:  Medications Prior to Admission  Medication Sig Dispense Refill Last Dose  . carvedilol (COREG) 6.25 MG tablet Take 1 tablet (6.25 mg total) by mouth 2 (two) times daily with a meal. Overdue for Annual appt must see provider for future refills 180 tablet 3 03/16/2021 at Unknown time  . enoxaparin (LOVENOX) 120 MG/0.8ML injection Inject 0.8 mLs (120 mg total) into the skin daily for 7 doses. 5.6 mL 0 03/16/2021 at 0700  . exemestane (AROMASIN) 25 MG tablet Take 1 tablet (25 mg total) by mouth daily after breakfast. 90 tablet 3 03/16/2021 at Unknown time  . ferrous sulfate 325 (65 FE) MG tablet Take 325 mg by mouth daily with breakfast.   Past Week at Unknown time  . levothyroxine (SYNTHROID) 50 MCG tablet Take 1 tablet (50 mcg total) by mouth daily before breakfast. Overdue for Annual appt must see provider for future refills 90 tablet 3 03/16/2021 at Unknown time  . magnesium oxide (MAG-OX) 400 MG tablet Take 400 mg by mouth 2 (two) times daily.   Past Week at Unknown time  . omeprazole (PRILOSEC) 40 MG capsule Take 1 capsule (40 mg total) by mouth daily. Overdue for Annual appt must see provider for future refills (Patient taking differently: Take 40 mg by mouth daily as needed (acid  reflux). Overdue for Annual appt must see provider for future refills) 90 capsule 3 Past Week at Unknown time  . polyethylene glycol powder (GLYCOLAX/MIRALAX) powder mix 1 capful (17 grams) IN 8 ounces OF liquid EVERY DAY (Patient taking differently: Take 17 g by mouth daily. mix 1 capful (17 grams) IN 8 ounces OF liquid EVERY DAY) 1581 g 3 03/16/2021 at Unknown time  . potassium chloride (KLOR-CON) 8 MEQ tablet Take 1 tablet (8 mEq total) by mouth daily. 90 tablet 3 Past Week at Unknown time  . torsemide (DEMADEX) 20 MG tablet Take 20 mg by mouth 3 (three) times a week.   03/16/2021 at Unknown time  . traMADol (ULTRAM) 50 MG tablet Take 1 tablet (50 mg total) by mouth every 12 (twelve)  hours. (Patient taking differently: Take 50 mg by mouth every 12 (twelve) hours as needed for severe pain.) 180 tablet 1 03/17/2021 at 0600  . allopurinol (ZYLOPRIM) 100 MG tablet Take 0.5 tablets (50 mg total) by mouth daily. 45 tablet 3 03/13/2021  . Cholecalciferol (VITAMIN D3) 2000 units capsule Take 1 capsule (2,000 Units total) by mouth daily. 100 capsule 3 03/12/2021  . Cyanocobalamin (VITAMIN B-12 PO) Take 2,000 mcg by mouth daily.   03/12/2021  . diclofenac Sodium (VOLTAREN) 1 % GEL APPLY 4 GRAMS FOUR TIMES DAILY AS NEEDED (Patient taking differently: Apply 4 g topically 4 (four) times daily as needed (pain). APPLY 4 GRAMS FOUR TIMES DAILY AS NEEDED) 100 g 5 Unknown at Unknown time  . lipase/protease/amylase (CREON) 12000-38000 units CPEP capsule Take 1 capsule (12,000 Units total) by mouth daily as needed (stomach problems). 270 capsule 3 03/13/2021  . lovastatin (MEVACOR) 20 MG tablet Take 1 tablet (20 mg total) by mouth daily. 90 tablet 3 03/12/2021  . methocarbamol (ROBAXIN) 500 MG tablet Take 1 tablet (500 mg total) by mouth 2 (two) times daily. (Patient taking differently: Take 500 mg by mouth 2 (two) times daily as needed for muscle spasms.) 20 tablet 0 More than a month at Unknown time  . OVER THE COUNTER MEDICATION Apply 1 application topically daily as needed (apply to buttocks  as needed for irritaiton). Intensive Skin Care Therapy   Unknown at Unknown time  . warfarin (COUMADIN) 5 MG tablet Take 1 tablet daily except take 1/2 tablet on Wed and Sat or Take as directed by anticoagulation clinic (Patient taking differently: Take 2.5 mg by mouth daily. Take as directed by anticoagulation clinic) 90 tablet 1 03/12/2021    Assessment: 75 yo F on warfarin PTA for Afib.  PTA she was on a lovenox bridge while warfarin was held for surgery. S/p R shoulder surgery 5/11 PM.  Pharmacy consulted to manage anticoagulation post op. LMWH 120 mg qda 5/5>>5/12 PTA for bridge therapy, LD 5/10 at 07 am.   PTA  warfarin dose 2.5 mg daily (taking 1/2 of 5 mg tablet). LD Friday 03/12/21.  03/18/2021 INR 2.2 - therapeutic.  Hg 9.4>2 U PRBC transfused>> 7.8, post op ABLA, SCr 1.74, CrCl ~ 33 ml/min  Goal of Therapy:  INR 2-3 Monitor platelets by anticoagulation protocol: Yes   Plan:  Resume PTA warfarin 2 mg po daily No LMWH bridge needed with therapeutic INR Daily INR  Eudelia Bunch, Pharm.D 03/18/2021 10:10 AM   Addendum  After conversation with Dr. Marylyn Ishihara, will hold warfarin tonight due to Hgb 5.9 and reassess tomorrow.   Ulice Dash D  03/18/21 2:17 PM

## 2021-03-18 NOTE — Plan of Care (Signed)
Reviewed.  Will need to review with son, [name].

## 2021-03-18 NOTE — Progress Notes (Signed)
Orthopedic Tech Progress Note Patient Details:  Katelyn Jackson 1946-07-04 [id number]  Ortho Devices Type of Ortho Device: Sling immobilizer Ortho Device/Splint Location: replacement arm sling Ortho Device/Splint Interventions: Application   Post Interventions Patient Tolerated: Well Instructions Provided: Care of device   [place] Pink 03/18/2021, 2:39 PM

## 2021-03-18 NOTE — Evaluation (Signed)
Occupational Therapy Evaluation Patient Details Name: Katelyn Jackson MRN: [id number] DOB: [birth year] Today's Date: 03/18/2021    History of Present Illness Patient is a 75 year old female admitted for R shoulder hemiarthroplasty. PMH significant for breast CA, multiple falls, s/p pacemaker, morbid obesity   Clinical Impression   Patient lives at home with son in a multi level home, can enter through garage with threshold step. Son reports there are small threshold steps on main level where patient stays, otherwise does not have to go up/down stairs. Son reports patient isn't alone often at all, tries to coordinate his schedule with aide that comes daily for few hours. At baseline patient has assist with dressing, bathing and managing LEs in/out of tub and bed. Can typically ambulate with walker and perform toileting tasks herself. Patient was having multiple falls prior to her pacemaker surgery but reports since this in Feb has not had a fall. Currently patient is min A for ambulation, trial use of rolling walker with OT steering on R side however was unsafe, has a quad cane at home and would benefit from trying this next session. Educated patient/son on shoulder precautions, how to maintain during ADLs and distal ROM exercises. Will continue to follow acutely to maximize patient safety, activity tolerance and reduce caregiver burden with ADLs.    Follow Up Recommendations  Home health OT;Supervision/Assistance - 24 hour;Other (comment) (increased aide support as needed)    Equipment Recommendations  None recommended by OT       Precautions / Restrictions Precautions Precautions: Fall;Shoulder Type of Shoulder Precautions: AROM elbow, wrist, hand ok. no ROM at shoulder Shoulder Interventions: Shoulder sling/immobilizer;Off for dressing/bathing/exercises Precaution Booklet Issued: Yes (comment) Precaution Comments: reports no falls since Feb, had 5-6 before pacemaker surgery in  Feb. Required Braces or Orthoses: Sling Restrictions Weight Bearing Restrictions: Yes RUE Weight Bearing: Non weight bearing      Mobility Bed Mobility Overal bed mobility: Needs Assistance Bed Mobility: Supine to Sit     Supine to sit: Mod assist;HOB elevated     General bed mobility comments: for trunk support    Transfers Overall transfer level: Needs assistance Equipment used: Rolling walker (2 wheeled) Transfers: Sit to/from Stand Sit to Stand: Min assist         General transfer comment: does well with powering up to stand at min A level, unsafe with walker while OT steers R side.    Balance Overall balance assessment: Needs assistance;History of Falls Sitting-balance support: Feet supported Sitting balance-Leahy Scale: Fair     Standing balance support: Single extremity supported Standing balance-Leahy Scale: Poor Standing balance comment: reliant on UE support                           ADL either performed or assessed with clinical judgement   ADL Overall ADL's : Needs assistance/impaired     Grooming: Set up;Sitting   Upper Body Bathing: Moderate assistance   Lower Body Bathing: Maximal assistance;Sitting/lateral leans;Sit to/from stand   Upper Body Dressing : Maximal assistance Upper Body Dressing Details (indicate cue type and reason): patient has assist with dressing at baseline, will likely need more assist due to shoulder precautions. instructed son on how to assist Lower Body Dressing: Maximal assistance;Sitting/lateral leans;Sit to/from stand Lower Body Dressing Details (indicate cue type and reason): had assist with dressing at baseline Toilet Transfer: Minimal assistance;Ambulation;BSC Toilet Transfer Details (indicate cue type and reason): patient does well with powering up to  stand from bedside commode, min A for steadying. attempted use of walker and OT steering on R side however unsafe Toileting- Clothing Manipulation and  Hygiene: Moderate assistance;Sitting/lateral lean;Sit to/from stand Toileting - Clothing Manipulation Details (indicate cue type and reason): patient able to perform peri care after voiding, will need assist with clothing management/post bowel movements     Functional mobility during ADLs: Minimal assistance General ADL Comments: patient's son educated in compensatory strategies for self care tasks in order to maintain shoulder precautions. Son familiar as they were using similar techniques pre surgery                  Pertinent Vitals/Pain Pain Assessment: No/denies pain     Hand Dominance Right   Extremity/Trunk Assessment Upper Extremity Assessment Upper Extremity Assessment: RUE deficits/detail RUE Deficits / Details: + nerve block, hand WFL RUE: Unable to fully assess due to immobilization   Lower Extremity Assessment Lower Extremity Assessment: Generalized weakness;Defer to PT evaluation   Cervical / Trunk Assessment Cervical / Trunk Assessment: Other exceptions Cervical / Trunk Exceptions: body habitus   Communication Communication Communication: Prefers language other than English   Cognition Arousal/Alertness: Awake/alert Behavior During Therapy: WFL for tasks assessed/performed Overall Cognitive Status: Within Functional Limits for tasks assessed                                     General Comments  patient maintain O2 90-93% on room air    Exercises Exercises: Shoulder   Shoulder Instructions Shoulder Instructions Donning/doffing shirt without moving shoulder: Maximal assistance;Caregiver independent with task Method for sponge bathing under operated UE: Maximal assistance;Caregiver independent with task Donning/doffing sling/immobilizer: Maximal assistance;Caregiver independent with task Correct positioning of sling/immobilizer: Moderate assistance;Caregiver independent with task Pendulum exercises (written home exercise program):   (N/A) ROM for elbow, wrist and digits of operated UE: Caregiver independent with task Sling wearing schedule (on at all times/off for ADL's): Caregiver independent with task Proper positioning of operated UE when showering: Caregiver independent with task Dressing change:  (N/A) Positioning of UE while sleeping: Caregiver independent with task    Home Living Family/patient expects to be discharged to:: Private residence Living Arrangements: Children Available Help at Discharge: Family;Personal care attendant Type of Home: House Home Access: Level entry     Home Layout: Multi-level;Able to live on main level with bedroom/bathroom     Bathroom Shower/Tub: Teacher, early years/pre: Standard     Home Equipment: Environmental consultant - 2 wheels;Walker - 4 wheels;Bedside commode;Grab bars - tub/shower;Tub bench (ordered w/c waiting for it. has adjustable bed)          Prior Functioning/Environment Level of Independence: Needs assistance  Gait / Transfers Assistance Needed: ambulates with 3FH ADL's / Homemaking Assistance Needed: aide comes daily 2-3 hrs assist with bathing, dressing, in/out of bed. has difficulty lifting legs. can toilet herself            OT Problem List: Decreased activity tolerance;Decreased range of motion;Impaired balance (sitting and/or standing);Decreased safety awareness;Decreased knowledge of use of DME or AE;Decreased knowledge of precautions;Obesity;Impaired UE functional use      OT Treatment/Interventions: Self-care/ADL training;Therapeutic exercise;DME and/or AE instruction;Therapeutic activities;Balance training;Patient/family education    OT Goals(Current goals can be found in the care plan section) Acute Rehab OT Goals Patient Stated Goal: care for mom OT Goal Formulation: With family Time For Goal Achievement: 04/01/21 Potential to Achieve Goals: Good  OT Frequency: Min 2X/week    AM-PAC OT "6 Clicks" Daily Activity     Outcome Measure Help  from another person eating meals?: A Little Help from another person taking care of personal grooming?: A Little Help from another person toileting, which includes using toliet, bedpan, or urinal?: A Lot Help from another person bathing (including washing, rinsing, drying)?: A Lot Help from another person to put on and taking off regular upper body clothing?: A Lot Help from another person to put on and taking off regular lower body clothing?: A Lot 6 Click Score: 14   End of Session Equipment Utilized During Treatment: Other (comment) (sling) Nurse Communication: Mobility status  Activity Tolerance: Patient tolerated treatment well Patient left: in chair;with call bell/phone within reach;with chair alarm set;with family/visitor present  OT Visit Diagnosis: Other abnormalities of gait and mobility (R26.89);Unsteadiness on feet (R26.81);History of falling (Z91.81)                Time: 0932-6712 OT Time Calculation (min): 38 min Charges:  OT General Charges $OT Visit: 1 Visit OT Evaluation $OT Eval Low Complexity: 1 Low OT Treatments $Self Care/Home Management : 23-37 mins  Delbert Phenix OT OT pager: [place] 03/18/2021, 3:37 PM

## 2021-03-19 ENCOUNTER — Inpatient Hospital Stay (HOSPITAL_COMMUNITY): Payer: Medicare HMO

## 2021-03-19 ENCOUNTER — Ambulatory Visit: Payer: Medicare HMO | Admitting: Podiatry

## 2021-03-19 DIAGNOSIS — D649 Anemia, unspecified: Secondary | ICD-10-CM | POA: Diagnosis not present

## 2021-03-19 DIAGNOSIS — N184 Chronic kidney disease, stage 4 (severe): Secondary | ICD-10-CM

## 2021-03-19 DIAGNOSIS — Z96611 Presence of right artificial shoulder joint: Secondary | ICD-10-CM | POA: Diagnosis not present

## 2021-03-19 DIAGNOSIS — K219 Gastro-esophageal reflux disease without esophagitis: Secondary | ICD-10-CM

## 2021-03-19 DIAGNOSIS — I5023 Acute on chronic systolic (congestive) heart failure: Secondary | ICD-10-CM

## 2021-03-19 DIAGNOSIS — D62 Acute posthemorrhagic anemia: Secondary | ICD-10-CM

## 2021-03-19 DIAGNOSIS — Z7901 Long term (current) use of anticoagulants: Secondary | ICD-10-CM

## 2021-03-19 DIAGNOSIS — R0600 Dyspnea, unspecified: Secondary | ICD-10-CM | POA: Diagnosis not present

## 2021-03-19 DIAGNOSIS — E785 Hyperlipidemia, unspecified: Secondary | ICD-10-CM

## 2021-03-19 DIAGNOSIS — D638 Anemia in other chronic diseases classified elsewhere: Secondary | ICD-10-CM | POA: Diagnosis not present

## 2021-03-19 DIAGNOSIS — I482 Chronic atrial fibrillation, unspecified: Secondary | ICD-10-CM

## 2021-03-19 DIAGNOSIS — Z170 Estrogen receptor positive status [ER+]: Secondary | ICD-10-CM

## 2021-03-19 DIAGNOSIS — C50412 Malignant neoplasm of upper-outer quadrant of left female breast: Secondary | ICD-10-CM

## 2021-03-19 LAB — ECHOCARDIOGRAM COMPLETE
Area-P 1/2: 3.83 cm2
Height: 61 in
S' Lateral: 4.4 cm
Weight: 4048 oz

## 2021-03-19 LAB — BASIC METABOLIC PANEL
Anion gap: 7 (ref 5–15)
BUN: 22 mg/dL (ref 8–23)
CO2: 30 mmol/L (ref 22–32)
Calcium: 9.4 mg/dL (ref 8.9–10.3)
Chloride: 100 mmol/L (ref 98–111)
Creatinine, Ser: 1.84 mg/dL — ABNORMAL HIGH (ref 0.44–1.00)
GFR, Estimated: 28 mL/min — ABNORMAL LOW (ref >60)
Glucose, Bld: 117 mg/dL — ABNORMAL HIGH (ref 70–99)
Potassium: 4 mmol/L (ref 3.5–5.1)
Sodium: 137 mmol/L (ref 135–145)

## 2021-03-19 LAB — BPAM RBC
Blood Product Expiration Date: 202206082359
Blood Product Expiration Date: 202206082359
ISSUE DATE / TIME: 202205121535
ISSUE DATE / TIME: 202205122041
Unit Type and Rh: 5100
Unit Type and Rh: 5100

## 2021-03-19 LAB — TYPE AND SCREEN
ABO/RH(D): O POS
Antibody Screen: NEGATIVE
Unit division: 0
Unit division: 0

## 2021-03-19 LAB — CBC
HCT: 27.2 % — ABNORMAL LOW (ref 36.0–46.0)
Hemoglobin: 8 g/dL — ABNORMAL LOW (ref 12.0–15.0)
MCH: 27.2 pg (ref 26.0–34.0)
MCHC: 29.4 g/dL — ABNORMAL LOW (ref 30.0–36.0)
MCV: 92.5 fL (ref 80.0–100.0)
Platelets: 152 10*3/uL (ref 150–400)
RBC: 2.94 MIL/uL — ABNORMAL LOW (ref 3.87–5.11)
RDW: 16.6 % — ABNORMAL HIGH (ref 11.5–15.5)
WBC: 9 10*3/uL (ref 4.0–10.5)
nRBC: 0 % (ref 0.0–0.2)

## 2021-03-19 LAB — PROTIME-INR
INR: 2.8 — ABNORMAL HIGH (ref 0.8–1.2)
Prothrombin Time: 29.5 seconds — ABNORMAL HIGH (ref 11.4–15.2)

## 2021-03-19 MED ORDER — METHOCARBAMOL 500 MG PO TABS: 500.0000 mg | ORAL_TABLET | Freq: Three times a day (TID) | ORAL | 0 refills | Status: DC | PRN

## 2021-03-19 MED ORDER — ACETAMINOPHEN 500 MG PO TABS: 1000.0000 mg | ORAL_TABLET | Freq: Three times a day (TID) | ORAL | 0 refills | Status: AC

## 2021-03-19 MED ORDER — ACETAMINOPHEN 325 MG PO TABS
650.0000 mg | ORAL_TABLET | Freq: Three times a day (TID) | ORAL | Status: DC
  Administered 2021-03-19 – 2021-03-20 (×4): 650 mg via ORAL
  Filled 2021-03-19 (×4): qty 2

## 2021-03-19 MED ORDER — TRAMADOL HCL 50 MG PO TABS: 50.0000 mg | ORAL_TABLET | Freq: Three times a day (TID) | ORAL | 0 refills | Status: DC | PRN

## 2021-03-19 NOTE — Progress Notes (Signed)
PROGRESS NOTE    Katelyn Jackson  NKN:[id number] DOB: [birth year] DOA: 03/17/2021 PCP: Cassandria Anger, MD (Confirm with patient/family/NH records and if not entered, this HAS to be entered at [facility] point of entry. "No PCP" if truly none.)   No chief complaint on file.   Brief Narrative:  Katelyn Jackson is a 75 y.o. female with medical history significant of permanent a fib, HTN, HLD, hypothyroidism, chronic lymphedema. Presenting with right shoulder pain. She has had multiple falls in the last several months resulting in right shoulder pain. She has had limited mobility in that arm and has had a radiating burning pain down that arm. She was evaluated by orthopedics who recommended surgery. She presented for that surgery and it was completed 03/17/21. Given her multiple co-morbidities, [facility] was consulted to help with medical management.    Assessment & Plan:   Principal Problem:   Status post right shoulder hemiarthroplasty Active Problems:   Dyspnea   Symptomatic anemia   Dyslipidemia   Chronic atrial fibrillation (HCC)   Malignant neoplasm of upper-outer quadrant of left breast in female, estrogen receptor positive (HCC)   Anemia of chronic disease   CRF (chronic renal failure), stage 4 (severe) (HCC)   Morbid obesity ([place])   Long term (current) use of anticoagulants   GERD (gastroesophageal reflux disease)   Postoperative anemia due to acute blood loss  1 dyspnea -Likely secondary to volume overload/vascular congestion in the setting of probable symptomatic anemia. -Patient with complaints of shortness of breath postoperatively. -Chest x-ray done concerning for pulmonary vascular congestion.  ABG within normal limits. -Patient noted to have therapeutic INR on Coumadin. -H&H obtained noted at 5.9. -Patient status post transfusion of 2 units packed red blood cells, Lasix 40 mg IV x1 with clinical improvement. -Urine output not recorded. -Symptomatic improvement. -2D echo  ordered and pending. -Continue home regimen Demadex, Coreg. -Follow H&H.  2.  Symptomatic anemia/postop acute blood loss anemia/iron deficiency anemia -Postoperatively repeat H&H obtained with a hemoglobin of 5.9. -Patient noted to have complaints of shortness of breath which improved with diuresis and transfusion. -Status post transfusion 2 units packed red blood cells. -Hemoglobin currently at 8.0. -Clinical improvement. -Transfusion threshold hemoglobin < 7. -Follow H&H.  3.  Permanent A. fib status post PPM on chronic anticoagulation with Coumadin -Continue Coreg for rate control. -Coumadin per pharmacy for anticoagulation.  4.  Hypothyroidism -Synthroid.  5.  Chronic kidney disease stage IV -Close to baseline. -Resumed on home regimen Demadex. -Follow.  6.  Hyperlipidemia -Continue statin.  7.  History of breast cancer -Status post surgery and radiation -Outpatient follow-up.  8.  Morbid obesity -Counseled on lifestyle changes and dietary changes.  9.  Right shoulder pain status post hemiarthroplasty -Per primary team.   DVT prophylaxis: On Coumadin Code Status: Full Family Communication: Updated patient and son at bedside Disposition:   Status is: Inpatient    Dispo: The patient is from: Home              Anticipated d/c is to: TBD              Patient currently status post right shoulder surgery, anemia status post transfusion of packed red blood cells, not stable for discharge.   Difficult to place patient no       Consultants:   [facility] hospitalist: Dr. Marylyn Ishihara 03/18/2021  Procedures:   Plain films of the left foot 03/19/2021  Chest x-ray 03/18/2021  Plain films of the right shoulder 03/17/2021  2D  echo 03/19/2021  Transfusion 2 units FFP 03/17/2021  Transfusion 2 units packed red blood cells 03/18/2021  Right shoulder hemiarthroplasty/right shoulder synovectomy with incision and debridement and open biopsy/nonoperative management of scapular  fracture per Dr. Griffin Basil 03/17/2021  Antimicrobials:   IV Ancef 03/17/2021>>>> 03/18/2021   Subjective: Patient laying in bed.  States breathing is significantly better since yesterday.  Denies any significant shortness of breath today.  No chest pain.  Denies any bleeding.  Objective: Vitals:   03/18/21 2342 03/19/21 0540 03/19/21 1000 03/19/21 1513  BP: 126/70 (!) 117/48 120/62 124/65  Pulse: 67 (!) 57  60  Resp: 20 18  17   Temp: 98.7 F (37.1 C)   98.4 F (36.9 C)  TempSrc: Oral     SpO2: 100% 100% 100% 97%  Weight:      Height:        Intake/Output Summary (Last 24 hours) at 03/19/2021 1728 Last data filed at 03/19/2021 1428 Gross per 24 hour  Intake 1819.33 ml  Output 350 ml  Net 1469.33 ml   Filed Weights   03/17/21 1133  Weight: 114.8 kg    Examination:  General exam: Appears calm and comfortable  Respiratory system: Some decreased breath sounds in the bases.  Some scattered coarse breath sounds otherwise clear.  No wheezing.  No rhonchi.  Speaking in full sentences.  Cardiovascular system: Irregularly irregular.  No JVD, no murmurs rubs or gallops.  Bilateral lower extremity lymphedema.  Gastrointestinal system: Abdomen is obese, nondistended, soft and nontender. No organomegaly or masses felt. Normal bowel sounds heard. Central nervous system: Alert and oriented. No focal neurological deficits. Extremities: Right upper extremity in sling.  Bilateral lower extremity lymphedema.   Skin: No rashes, lesions or ulcers Psychiatry: Judgement and insight appear normal. Mood & affect appropriate.     Data Reviewed: I have personally reviewed following labs and imaging studies  CBC: Recent Labs  Lab 03/17/21 1205 03/18/21 0324 03/18/21 1256 03/19/21 0335  WBC 4.9 7.8  --  9.0  HGB 9.4* 7.8* 5.9* 8.0*  HCT 32.8* 27.2* 21.0* 27.2*  MCV 92.4 92.8  --  92.5  PLT 247 167  --  324    Basic Metabolic Panel: Recent Labs  Lab 03/17/21 1703 03/18/21 0324  03/19/21 0335  NA 138 136 137  K 3.9 4.3 4.0  CL 102 101 100  CO2 28 26 30   GLUCOSE 111* 148* 117*  BUN 17 18 22   CREATININE 1.67* 1.74* 1.84*  CALCIUM 9.6 9.5 9.4    GFR: Estimated Creatinine Clearance: 31.1 mL/min (A) (by C-G formula based on SCr of 1.84 mg/dL (H)).  Liver Function Tests: No results for input(s): AST, ALT, ALKPHOS, BILITOT, PROT, ALBUMIN in the last 168 hours.  CBG: No results for input(s): GLUCAP in the last 168 hours.   Recent Results (from the past 240 hour(s))  SARS CORONAVIRUS 2 (TAT 6-24 HRS) Nasopharyngeal Nasopharyngeal Swab     Status: None   Collection Time: 03/15/21  9:56 AM   Specimen: Nasopharyngeal Swab  Result Value Ref Range Status   SARS Coronavirus 2 NEGATIVE NEGATIVE Final    Comment: (NOTE) SARS-CoV-2 target nucleic acids are NOT DETECTED.  The SARS-CoV-2 RNA is generally detectable in upper and lower respiratory specimens during the acute phase of infection. Negative results do not preclude SARS-CoV-2 infection, do not rule out co-infections with other pathogens, and should not be used as the sole basis for treatment or other patient management decisions. Negative results must be combined  with clinical observations, patient history, and epidemiological information. The expected result is Negative.  Fact Sheet for Patients: SugarRoll.be  Fact Sheet for Healthcare Providers: https://www.woods-mathews.com/  This test is not yet approved or cleared by the [place] FDA and  has been authorized for detection and/or diagnosis of SARS-CoV-2 by FDA under an Emergency Use Authorization (EUA). This EUA will remain  in effect (meaning this test can be used) for the duration of the COVID-19 declaration under Se ction 564(b)(1) of the Act, 21 U.S.C. section 360bbb-3(b)(1), unless the authorization is terminated or revoked sooner.  Performed at [facility], [place] [address]  [place], [place] [address]   Aerobic/Anaerobic Culture w Gram Stain (surgical/deep wound)     Status: None (Preliminary result)   Collection Time: 03/17/21  3:08 PM   Specimen: Soft Tissue, Other  Result Value Ref Range Status   Specimen Description   Final    TISSUE RIGHT SOULDER NO. 1 Performed at [facility] [address] [place], [place] [address]    Special Requests   Final    HOLD 3 WKS TO R/O C.ACNES Performed at [facility], [place] [address], [place], [place] [address]    Gram Stain NO WBC SEEN NO ORGANISMS SEEN   Final   Culture   Final    NO GROWTH 1 DAY Performed at [facility], [place] [address], [place], [place] [address]    Report Status PENDING  Incomplete  Aerobic/Anaerobic Culture w Gram Stain (surgical/deep wound)     Status: None (Preliminary result)   Collection Time: 03/17/21  3:12 PM   Specimen: Soft Tissue, Other  Result Value Ref Range Status   Specimen Description   Final    TISSUE RIGHT SHOULDER NO. 2 Performed at [facility] [place] [facility] [address], [place], [place] [address]    Special Requests   Final    HOLD 3 WKS TO R/O C.ACNES Performed at [facility], [place] [address], [place], [place] [address]    Gram Stain   Final    FEW WBC PRESENT, PREDOMINANTLY PMN NO ORGANISMS SEEN    Culture   Final    NO GROWTH 1 DAY Performed at [facility] [address], [place], [place] [address]    Report Status PENDING  Incomplete         Radiology Studies: DG CHEST PORT 1 VIEW  Result Date: 03/18/2021 CLINICAL DATA:  Shortness of breath EXAM: PORTABLE CHEST 1 VIEW COMPARISON:  01/07/2021 FINDINGS: Heart is enlarged. Left pacer remains in place, unchanged. There is vascular congestion. Interstitial prominence throughout the lungs, likely interstitial edema. Possible small right pleural effusion. No acute bony abnormality. IMPRESSION: Cardiomegaly with vascular congestion and  interstitial prominence, likely interstitial edema/CHF. Small right effusion. Electronically Signed   By: Rolm Baptise M.D.   On: 03/18/2021 11:44   DG Foot Complete Left  Result Date: 03/19/2021 CLINICAL DATA:  History of LEFT foot fracture EXAM: LEFT FOOT - COMPLETE 3+ VIEW COMPARISON:  January 19, 2021 FINDINGS: Osteopenia. Mild irregularity of the lateral aspect of the base of the first proximal phalanx consistent with reported healing fracture. No additional acute fracture or dislocation noted. Contour deformities of the heads of the fourth and fifth metatarsals consistent with the sequela of prior fractures. Vascular calcifications. Pes planus. Midfoot degenerative changes. Soft tissue edema. IMPRESSION: Healing fractures of the base of the first proximal phalanx and the head of fourth and  fifth metatarsals. Electronically Signed   By: Valentino Saxon MD   On: 03/19/2021 15:19   ECHOCARDIOGRAM COMPLETE  Result Date: 03/19/2021    ECHOCARDIOGRAM REPORT   Patient Name:   Katelyn Jackson Date of Exam: 03/19/2021 Medical Rec #:  [id number]         Height:       61.0 in Accession #:    [id number]        Weight:       253.0 lb Date of Birth:  11/16/1945         BSA:          2.087 m Patient Age:    55 years          BP:           117/48 mmHg Patient Gender: F                 HR:           54 bpm. Exam Location:  Inpatient Procedure: 2D Echo, Cardiac Doppler and Color Doppler Indications:    I50.23 Acute on chronic systolic (congestive) heart failure  History:        Patient has prior history of Echocardiogram examinations, most                 recent 05/14/2015. Pacemaker, Pulmonary HTN, Arrythmias:Atrial                 Fibrillation, Signs/Symptoms:Syncope; Risk Factors:Hypertension                 and Dyslipidemia. Cancer. GERD. Hypothyroidism.  Sonographer:    Tiffany Dance Referring Phys: [id number] [place]  1. Left ventricular ejection fraction, by estimation, is 45 to 50%. The left  ventricle has mildly decreased function. The left ventricle demonstrates global hypokinesis. The left ventricular internal cavity size was moderately dilated. Left ventricular diastolic parameters are indeterminate.  2. Right ventricular systolic function is mildly reduced. The right ventricular size is mildly enlarged. There is mildly elevated pulmonary artery systolic pressure.  3. Left atrial size was severely dilated.  4. Right atrial size was severely dilated.  5. The mitral valve is normal in structure. Severe mitral valve regurgitation. No evidence of mitral stenosis.  6. The aortic valve is normal in structure. There is mild calcification of the aortic valve. Aortic valve regurgitation is trivial. No aortic stenosis is present.  7. The inferior vena cava is normal in size with greater than 50% respiratory variability, suggesting right atrial pressure of 3 mmHg. FINDINGS  Left Ventricle: Left ventricular ejection fraction, by estimation, is 45 to 50%. The left ventricle has mildly decreased function. The left ventricle demonstrates global hypokinesis. The left ventricular internal cavity size was moderately dilated. There is no left ventricular hypertrophy. Left ventricular diastolic function could not be evaluated due to atrial fibrillation. Left ventricular diastolic parameters are indeterminate. Right Ventricle: The right ventricular size is mildly enlarged. No increase in right ventricular wall thickness. Right ventricular systolic function is mildly reduced. There is mildly elevated pulmonary artery systolic pressure. The tricuspid regurgitant  velocity is 2.70 m/s, and with an assumed right atrial pressure of 15 mmHg, the estimated right ventricular systolic pressure is 37.3 mmHg. Left Atrium: Left atrial size was severely dilated. Right Atrium: Right atrial size was severely dilated. Pericardium: There is no evidence of pericardial effusion. Mitral Valve: The mitral valve is normal in structure. Mild  mitral annular calcification. Severe mitral valve regurgitation, with centrally-directed  jet. No evidence of mitral valve stenosis. Tricuspid Valve: The tricuspid valve is normal in structure. Tricuspid valve regurgitation is mild . No evidence of tricuspid stenosis. Aortic Valve: The aortic valve is normal in structure. There is mild calcification of the aortic valve. Aortic valve regurgitation is trivial. No aortic stenosis is present. Pulmonic Valve: The pulmonic valve was normal in structure. Pulmonic valve regurgitation is trivial. No evidence of pulmonic stenosis. Aorta: The aortic root is normal in size and structure. Venous: The inferior vena cava is normal in size with greater than 50% respiratory variability, suggesting right atrial pressure of 3 mmHg. IAS/Shunts: No atrial level shunt detected by color flow Doppler.  LEFT VENTRICLE PLAX 2D LVIDd:         6.10 cm LVIDs:         4.40 cm LV PW:         1.20 cm LV IVS:        1.10 cm LVOT diam:     2.00 cm LV SV:         74 LV SV Index:   35 LVOT Area:     3.14 cm  RIGHT VENTRICLE            IVC RV Basal diam:  3.40 cm    IVC diam: 3.30 cm RV Mid diam:    2.70 cm RV S prime:     9.79 cm/s TAPSE (M-mode): 2.0 cm LEFT ATRIUM              Index        RIGHT ATRIUM           Index LA diam:        5.80 cm  2.78 cm/m   RA Area:     24.70 cm LA Vol (A2C):   218.0 ml 104.45 ml/m RA Volume:   74.80 ml  35.84 ml/m LA Vol (A4C):   142.0 ml 68.03 ml/m LA Biplane Vol: 180.0 ml 86.24 ml/m  AORTIC VALVE LVOT Vmax:   120.50 cm/s LVOT Vmean:  79.250 cm/s LVOT VTI:    0.234 m  AORTA Ao Root diam: 3.10 cm Ao Asc diam:  3.75 cm MITRAL VALVE                TRICUSPID VALVE MV Area (PHT): 3.83 cm     TR Peak grad:   29.2 mmHg MV Decel Time: 198 msec     TR Vmax:        270.00 cm/s MV E velocity: 165.00 cm/s MV A velocity: 40.80 cm/s   SHUNTS MV E/A ratio:  4.04         Systemic VTI:  0.23 m                             Systemic Diam: 2.00 cm Glori Bickers MD  Electronically signed by Glori Bickers MD Signature Date/Time: 03/19/2021/3:31:00 PM    Final         Scheduled Meds: . sodium chloride   Intravenous Once  . acetaminophen  650 mg Oral Q8H  . allopurinol  50 mg Oral Daily  . carvedilol  6.25 mg Oral BID WC  . docusate sodium  100 mg Oral BID  . exemestane  25 mg Oral QPC breakfast  . ferrous sulfate  325 mg Oral Q breakfast  . levothyroxine  50 mcg Oral QAC breakfast  . magnesium oxide  400 mg Oral BID  .  pantoprazole  40 mg Oral Daily  . potassium chloride  10 mEq Oral Daily  . pravastatin  20 mg Oral q1800  . torsemide  20 mg Oral Once per day on Tue Thu Sat  . Warfarin - Pharmacist Dosing Inpatient   Does not apply q1600   Continuous Infusions: . lactated ringers 50 mL/hr at 03/17/21 2259  . methocarbamol (ROBAXIN) IV       LOS: 1 day    Time spent: 35 minutes    Irine Seal, MD [facility]   To contact the attending provider between 7A-7P or the covering provider during after hours 7P-7A, please log into the web site [email or web address] and access using universal [place] password for that web site. If you do not have the password, please call the hospital operator.  03/19/2021, 5:28 PM

## 2021-03-19 NOTE — Discharge Instructions (Signed)
Ophelia Charter MD, MPH [name], PA-C [facility] [address], [address] [phone] (tel)   [phone] (fax)   [facility] REPLACEMENT    WOUND CARE ? You may leave the operative dressing in place until your follow-up appointment. ? KEEP THE INCISIONS CLEAN AND DRY. ? There may be a small amount of fluid/bleeding leaking at the surgical site. This is normal after surgery.  ? If it fills with liquid or blood please call us immediately to change it for you. ? Use the provided ice machine or Ice packs as often as possible for the first 3-4 days, then as needed for pain relief.  Keep a layer of cloth or a shirt between your skin and the cooling unit to prevent frost bite as it can get very cold.  SHOWERING: - You may shower on Post-Op Day #2.  - The dressing is water resistant but do not scrub it as it may start to peel up.   - You may remove the sling for showering, but keep a water resistant pillow under the arm to keep both the  elbow and shoulder away from the body (mimicking the abduction sling).  - Gently pat the area dry.  - Do not soak the shoulder in water. Do not go swimming in the pool or ocean until your incision has completely healed (about 4-6 weeks after surgery) - KEEP THE INCISIONS CLEAN AND DRY.  EXERCISES ? Wear the sling at all times.  ? You may remove the sling for showering, but keep the arm across the chest or in a secondary sling.    ? Accidental/Purposeful External Rotation and shoulder flexion (reaching behind you) is to be avoided at all costs for the first month. ? It is ok to come out of your sling if your are sitting and have assistance for eating.   ? Do not lift anything heavier than 1 pound until we discuss it further in clinic.   REGIONAL ANESTHESIA (NERVE BLOCKS) . The anesthesia team may have performed a nerve block for you if safe in the setting of your care.  This is a great tool used  to minimize pain.  Typically the block may start wearing off overnight but the long acting medicine may last for 3-4 days.  The nerve block wearing off can be a challenging period but please utilize your as needed pain medications to try and manage this period.    POST-OP MEDICATIONS- Multimodal approach to pain control . In general your pain will be controlled with a combination of substances.  Prescriptions unless otherwise discussed are electronically sent to your pharmacy.  This is a carefully made plan we use to minimize narcotic use.     ? Acetaminophen - Non-narcotic pain medicine taken on a scheduled basis  ? Take two 500 mg (1,000 mg total) every 8 hours ? Robaxin - this is a muscle relaxer, take as needed for muscle spasms ? Tramadol - This is a strong narcotic, to be used only on an "as needed" basis for severe pain.    FOLLOW-UP ? If you develop a Fever (>101.5), Redness or Drainage from the surgical incision site, please call our office to arrange for an evaluation. ? Please call the office to schedule a follow-up appointment for a wound check, 7-10 days post-operatively.  IF YOU HAVE ANY QUESTIONS, PLEASE FEEL FREE TO CALL OUR OFFICE.  HELPFUL INFORMATION  . If you had a block, it will wear  off between 8-24 hrs postop typically.  This is period when your pain may go from nearly zero to the pain you would have had post-op without the block.  This is an abrupt transition but nothing dangerous is happening.  You may take an extra dose of narcotic when this happens.  ? Your arm will be in a sling following surgery. You will be in this sling for the next 4 weeks.  I will let you know the exact duration at your follow-up visit.  ? You may be more comfortable sleeping in a semi-seated position the first few nights following surgery.  Keep a pillow propped under the elbow and forearm for comfort.  If you have a recliner type of chair it might be beneficial.  If not that is fine too, but  it would be helpful to sleep propped up with pillows behind your operated shoulder as well under your elbow and forearm.  This will reduce pulling on the suture lines.  ? When dressing, put your operative arm in the sleeve first.  When getting undressed, take your operative arm out last.  Loose fitting, button-down shirts are recommended.  ? In most states it is against the law to drive while your arm is in a sling. And certainly against the law to drive while taking narcotics.  ? You may return to work/school in the next couple of days when you feel up to it. Desk work and typing in the sling is fine.  ? We suggest you use the pain medication the first night prior to going to bed, in order to ease any pain when the anesthesia wears off. You should avoid taking pain medications on an empty stomach as it will make you nauseous.  ? Do not drink alcoholic beverages or take illicit drugs when taking pain medications.  ? Pain medication may make you constipated.  Below are a few solutions to try in this order: - Decrease the amount of pain medication if you aren't having pain. - Drink lots of decaffeinated fluids. - Drink prune juice and/or each dried prunes  o If the first 3 don't work start with additional solutions - Take Colace - an over-the-counter stool softener - Take Senokot - an over-the-counter laxative - Take Miralax - a stronger over-the-counter laxative   Dental Antibiotics:  In most cases prophylactic antibiotics for Dental procdeures after total joint surgery are not necessary.  Exceptions are as follows:  1. History of prior total joint infection  2. Severely immunocompromised (Organ Transplant, cancer chemotherapy, Rheumatoid biologic meds such as [place])  3. Poorly controlled diabetes (A1C &gt; 8.0, blood glucose over 200)  If you have one of these conditions, contact your surgeon for an antibiotic prescription, prior to your dental procedure.  For more information  including helpful videos and documents visit our website:   [email or web address]

## 2021-03-19 NOTE — Progress Notes (Signed)
   ORTHOPAEDIC PROGRESS NOTE  s/p Procedure(s): SHOULDER HEMI-ARTHROPLASTY  SUBJECTIVE: Translation provided by her son at bedside. Reports no pain in the right shoulder. Feels better today. Sitting up in chair. SOB improving. No cough, chest pain, no fevers.   OBJECTIVE: PE: General: sitting up in chair, NAD RUE: prevena wound vac in place with good seal. No drainage noted in the canister. prevena was removed. Incision CDI. Nylon sutures in place. No active drainage. She endorses axillary nerve sensation. Appreciated a flicker of deltoid motor function. Chronic lymphedema of right upper extremity. Distal motor and sensory function intact. Warm well perfused hand.   Vitals:   03/19/21 0540 03/19/21 1000  BP: (!) 117/48 120/62  Pulse: (!) 57   Resp: 18   Temp:    SpO2: 100% 100%     ASSESSMENT: Katelyn Jackson is a 75 y.o. female  S/p Right shoulder hemiarthroplasty, synovectomy on 5/11 - POD#2  PLAN: Weightbearing: NWB RUE Insicional and dressing care: Wound vac removed. Aquacel placed. Reinforce as needed.  Orthopedic device(s): Prevena wound vac, Sling Showering: Okay to shower with assistance VTE prophylaxis: On chronic anticoagulation Pain control: PRN pain medications, preferring oral medications Follow - up plan: Next week in office for recheck Fracture of left 1st toe and 5th metatarsal: Patient has been seeing Dr. March Rummage for her foot fracture. She has been in a surgical shoe while weight bearing. We will order repeat x-rays of the left foot today. She was supposed to have a f/u appointment with Dr. March Rummage today.   Dispo: Appreciate hospitalist consultation for medical management of this patient. Her dyspnea has improved. She did have a Hgb of 5/9 yesterday and received 2 units of pRBCs. Hgb 8.0 today. Discussed case with hospitalist team, we will monitor her Hgb for 24 hours. If no decline then will likely discharge home tomorrow with her son. Pharmacy has been managing  her coumadin. Appreciate their consultation as well. Her son has been updated and agrees with the plan of care. Will request [facility] consult for discharge planning.   Contact information: Dr. Ophelia Charter, Katelyn Chapel PA-C, After hours and holidays please check [email or web address] for group call information for [facility]  Katelyn Chapel, PA-C 03/19/2021

## 2021-03-19 NOTE — Evaluation (Signed)
Physical Therapy Evaluation Patient Details Name: Katelyn Jackson MRN: [id number] DOB: 1946-03-05 Today's Date: 03/19/2021   History of Present Illness  Patient is a 75 year old female admitted for R shoulder hemiarthroplasty. PMH significant for breast CA, multiple falls, s/p pacemaker, morbid obesity  Clinical Impression  Pt admitted with above diagnosis.  Pt unsteady and dizzy with gait today - short distance. May need QC, will continue to assess DME needs next session Pt currently with functional limitations due to the deficits listed below (see PT Problem List). Pt will benefit from skilled PT to increase their independence and safety with mobility to allow discharge to the venue listed below.       Follow Up Recommendations Home health PT    Equipment Recommendations  Other (comment) (?QC)    Recommendations for Other Services       Precautions / Restrictions Precautions Precautions: Fall;Shoulder Type of Shoulder Precautions: AROM elbow, wrist, hand ok. no ROM at shoulder Shoulder Interventions: Shoulder sling/immobilizer;Off for dressing/bathing/exercises Precaution Booklet Issued: Yes (comment) Precaution Comments: reports no falls since Feb, had 5-6 before pacemaker surgery in Feb. (multiple falls prior to PM) Required Braces or Orthoses: Other Brace Other Brace: post op shoe Restrictions Weight Bearing Restrictions: Yes RUE Weight Bearing: Non weight bearing      Mobility  Bed Mobility Overal bed mobility: Needs Assistance Bed Mobility: Supine to Sit     Supine to sit: Min assist;HOB elevated     General bed mobility comments: OOB on arrival    Transfers Overall transfer level: Needs assistance Equipment used: None Transfers: Sit to/from Stand Sit to Stand: Min assist Stand pivot transfers: Min guard       General transfer comment: min assist and 2 attempts to power up from EOB. cues for L hand placement  Ambulation/Gait Ambulation/Gait  assistance: Min assist Gait Distance (Feet): 10 Feet Assistive device: Rolling walker (2 wheeled) Gait Pattern/deviations: Step-to pattern;Wide base of support;Trendelenburg Gait velocity: decr   General Gait Details: slow gait, transitioned from [facility] to one hand drive RW. pt unsteady however without overt LOB. c/o dizziness, chair to pt. BP 148/78 after seated in recliner  Stairs            Wheelchair Mobility    Modified Rankin (Stroke Patients Only)       Balance Overall balance assessment: Needs assistance;History of Falls Sitting-balance support: Feet supported Sitting balance-Leahy Scale: Fair     Standing balance support: Single extremity supported Standing balance-Leahy Scale: Poor Standing balance comment: reliant on UE support                             Pertinent Vitals/Pain Pain Assessment: Faces Faces Pain Scale: Hurts little more Pain Location: R shoulder, L foot Pain Descriptors / Indicators: Sore Pain Intervention(s): Limited activity within patient's tolerance;Monitored during session;Premedicated before session;Repositioned    Home Living Family/patient expects to be discharged to:: Private residence   Available Help at Discharge: Family;Personal care attendant Type of Home: House Home Access: Level entry     Home Layout: Multi-level;Able to live on main level with bedroom/bathroom Home Equipment: Gilford Rile - 2 wheels;Walker - 4 wheels;Bedside commode;Grab bars - tub/shower;Tub bench      Prior Function Level of Independence: Needs assistance   Gait / Transfers Assistance Needed: ambulates with [facility]  ADL's / Homemaking Assistance Needed: aide comes daily 2-3 hrs assist with bathing, dressing, in/out of bed. has difficulty lifting legs. can toilet herself  Hand Dominance        Extremity/Trunk Assessment   Upper Extremity Assessment Upper Extremity Assessment: Defer to OT evaluation    Lower Extremity  Assessment Lower Extremity Assessment: Generalized weakness    Cervical / Trunk Assessment Cervical / Trunk Assessment: Other exceptions Cervical / Trunk Exceptions: body habitus  Communication   Communication: Prefers language other than English  Cognition Arousal/Alertness: Awake/alert Behavior During Therapy: WFL for tasks assessed/performed Overall Cognitive Status: Within Functional Limits for tasks assessed                                        General Comments      Exercises     Assessment/Plan    PT Assessment Patient needs continued PT services  PT Problem List Decreased strength;Decreased mobility;Decreased activity tolerance;Decreased balance;Decreased knowledge of use of DME       PT Treatment Interventions DME instruction;Therapeutic activities;Gait training;Functional mobility training;Therapeutic exercise;Patient/family education    PT Goals (Current goals can be found in the Care Plan section)  Acute Rehab PT Goals Patient Stated Goal: care for mom PT Goal Formulation: With patient Time For Goal Achievement: 03/26/21 Potential to Achieve Goals: Good    Frequency Min 5X/week   Barriers to discharge        Co-evaluation               AM-PAC PT "6 Clicks" Mobility  Outcome Measure Help needed turning from your back to your side while in a flat bed without using bedrails?: A Lot Help needed moving from lying on your back to sitting on the side of a flat bed without using bedrails?: A Lot Help needed moving to and from a bed to a chair (including a wheelchair)?: A Little Help needed standing up from a chair using your arms (e.g., wheelchair or bedside chair)?: A Lot Help needed to walk in hospital room?: A Little Help needed climbing 3-5 steps with a railing? : Total 6 Click Score: 13    End of Session Equipment Utilized During Treatment: Gait belt;Other (comment) (R shoulder sling, post op shoe on L foot) Activity Tolerance:  Patient tolerated treatment well Patient left: with call bell/phone within reach;in chair;with family/visitor present (in recliner on arrival)   PT Visit Diagnosis: Other abnormalities of gait and mobility (R26.89)    Time: 1421-1450 PT Time Calculation (min) (ACUTE ONLY): 29 min   Charges:   PT Evaluation $PT Eval Low Complexity: 1 Low PT Treatments $Gait Training: 8-22 mins        Baxter Flattery, PT  Acute Rehab Dept ([place]) [phone] Pager [phone]  03/19/2021   [facility] 03/19/2021, 3:04 PM

## 2021-03-19 NOTE — Progress Notes (Signed)
Occupational Therapy Treatment Patient Details Name: Katelyn Jackson MRN: [id number] DOB: Aug 08, 1946 Today's Date: 03/19/2021    History of present illness Patient is a 75 year old female admitted for R shoulder hemiarthroplasty. PMH significant for breast CA, multiple falls, s/p pacemaker, morbid obesity   OT comments  Patient reporting nerve block wearing off with increased pain in R shoulder. Patient min A to power up to standing from edge of bed, min G to transfer to bedside commode and in standing for peri care. Patient with improved safety min G to ambulate from commode to recliner. Educate patient + son re: techniques for bathing and water proof bandage as wound vac coming off today. Continue with POC, hopeful for D/C tomorrow.   Follow Up Recommendations  Home health OT;Supervision/Assistance - 24 hour;Other (comment) (increased aid support as needed)    Equipment Recommendations  None recommended by OT       Precautions / Restrictions Precautions Precautions: Fall;Shoulder Type of Shoulder Precautions: AROM elbow, wrist, hand ok. no ROM at shoulder Shoulder Interventions: Shoulder sling/immobilizer;Off for dressing/bathing/exercises Precaution Booklet Issued: Yes (comment) Precaution Comments: reports no falls since Feb, had 5-6 before pacemaker surgery in Feb. Required Braces or Orthoses: Sling Restrictions Weight Bearing Restrictions: No RUE Weight Bearing: Non weight bearing       Mobility Bed Mobility Overal bed mobility: Needs Assistance Bed Mobility: Supine to Sit     Supine to sit: Min assist;HOB elevated     General bed mobility comments: for LE management out of bed    Transfers Overall transfer level: Needs assistance Equipment used: Quad cane Transfers: Sit to/from [employer] to Stand: Min assist Stand pivot transfers: Min guard       General transfer comment: min A to power up to standing from edge of bed, min G take few  steps to bedside commode and to stand from commode    Balance Overall balance assessment: Needs assistance;History of Falls Sitting-balance support: Feet supported Sitting balance-Leahy Scale: Fair     Standing balance support: Single extremity supported Standing balance-Leahy Scale: Poor Standing balance comment: reliant on UE support                           ADL either performed or assessed with clinical judgement   ADL Overall ADL's : Needs assistance/impaired                         Toilet Transfer: Minimal assistance;Stand-pivot;BSC (quad cane) Toilet Transfer Details (indicate cue type and reason): min A to power up to standing from edge of bed, min G to stand from bedside commode Toileting- Clothing Manipulation and Hygiene: Sit to/from stand Toileting - Clothing Manipulation Details (indicate cue type and reason): patient is able to perform peri care after voiding, will need assist with clothing management/after bowel movement     Functional mobility during ADLs: Minimal assistance;Min guard (quad cane)                 Cognition Arousal/Alertness: Awake/alert Behavior During Therapy: WFL for tasks assessed/performed Overall Cognitive Status: Within Functional Limits for tasks assessed                                                     Pertinent Vitals/ Pain  Pain Assessment: Faces Faces Pain Scale: Hurts little more Pain Location: R shoulder, L foot Pain Descriptors / Indicators: Sore Pain Intervention(s): Monitored during session         Frequency  Min 2X/week        Progress Toward Goals  OT Goals(current goals can now be found in the care plan section)  Progress towards OT goals: Progressing toward goals  Acute Rehab OT Goals Patient Stated Goal: care for mom OT Goal Formulation: With family Time For Goal Achievement: 04/01/21 Potential to Achieve Goals: Good ADL Goals Pt Will Perform Upper  Body Dressing: with mod assist;sitting;standing Pt Will Transfer to Toilet: with supervision;ambulating;bedside commode (quad cane) Pt Will Perform Toileting - Clothing Manipulation and hygiene: with supervision;sit to/from stand;sitting/lateral leans Pt/caregiver will Perform Home Exercise Program: With Supervision;With written HEP provided;Increased ROM;Right Upper extremity (distal ROM)  Plan Discharge plan remains appropriate       AM-PAC OT "6 Clicks" Daily Activity     Outcome Measure   Help from another person eating meals?: A Little Help from another person taking care of personal grooming?: A Little Help from another person toileting, which includes using toliet, bedpan, or urinal?: A Lot Help from another person bathing (including washing, rinsing, drying)?: A Lot Help from another person to put on and taking off regular upper body clothing?: A Lot Help from another person to put on and taking off regular lower body clothing?: A Lot 6 Click Score: 14    End of Session Equipment Utilized During Treatment: Other (comment) (quad cane, sling)  OT Visit Diagnosis: Other abnormalities of gait and mobility (R26.89);Unsteadiness on feet (R26.81);History of falling (Z91.81)   Activity Tolerance Patient tolerated treatment well   Patient Left in chair;with call bell/phone within reach;with family/visitor present   Nurse Communication Mobility status        Time: 4982-6415 OT Time Calculation (min): 18 min  Charges: OT General Charges $OT Visit: 1 Visit OT Treatments $Self Care/Home Management : 8-22 mins  Delbert Phenix OT OT pager: [place] 03/19/2021, 1:02 PM

## 2021-03-19 NOTE — Progress Notes (Signed)
PT Cancellation Note  Patient Details Name: Katelyn Jackson MRN: [id number] DOB: Aug 22, 1946   Cancelled Treatment:    Reason Eval/Treat Not Completed: Patient at procedure or test/unavailable (pt having cardiac ultrasound. Will follow.)  Philomena Doheny PT 03/19/2021  Acute Rehabilitation Services Pager [phone] Office [phone]

## 2021-03-19 NOTE — TOC Initial Note (Signed)
Transition of Care [facility]) - Initial/Assessment Note   Patient Details  Name: Katelyn Jackson MRN: [id number] Date of Birth: 06-29-1946  Transition of Care [facility]) CM/SW Contact:    Sherie Don, LCSW Phone Number: 03/19/2021, 3:28 PM  Clinical Narrative: Patient is a 75 year old female who was admitted for right shoulder hemiarthroplasty. Readmission checklist completed due to high readmission score.  CSW spoke with patient's son, Macky Lower, to complete assessment. Per son, patient resides at home with him. Prior to her surgery, patient required some assistance with completing ADLs. Son reported no issues with affording medications or the patient taking them as prescribed. Son reported patient is currently active with [facility] for [facility]. PT and OT evaluations recommended HHPT and HHOT. Current DME includes a rolling walker. Son provides transportation to medical appointments.  CSW confirmed with Georgina Snell at [facility] that patient is already active with [facility] services and will need PT and OT at discharge. TOC to follow.  Expected Discharge Plan: [place] Barriers to Discharge: Continued Medical Work up  Patient Goals and CMS Choice Patient states their goals for this hospitalization and ongoing recovery are:: Discharge home with [facility] through [email or web address] Compare Post Acute Care list provided to:: Patient Choice offered to / list presented to : [place]  Expected Discharge Plan and Services Expected Discharge Plan: [place] In-house Referral: Clinical Social Work Post Acute Care Choice: [place] arrangements for the past 2 months: [place]: OT,PT [place]: [place] Date [place]: 03/19/21 Representative spoke with at [place]: Georgina Snell  Prior Living Arrangements/Services Living arrangements for the past 2 months: [place] Lives with:: Adult Children Patient language  and need for interpreter reviewed:: Yes Do you feel safe going back to the place where you live?: Yes      Need for Family Participation in Patient Care: Yes (Comment) Care giver support system in place?: Yes (comment) Current home services: DME (Rolling walker) Criminal Activity/Legal Involvement Pertinent to Current Situation/Hospitalization: No - Comment as needed  Activities of Daily Living Home Assistive Devices/Equipment: Walker (specify type) ADL Screening (condition at time of admission) Patient's cognitive ability adequate to safely complete daily activities?: Yes Is the patient deaf or have difficulty hearing?: No Does the patient have difficulty seeing, even when wearing glasses/contacts?: No Does the patient have difficulty concentrating, remembering, or making decisions?: No Patient able to express need for assistance with ADLs?: Yes Does the patient have difficulty dressing or bathing?: Yes Independently performs ADLs?: No Communication: Independent Dressing (OT): Needs assistance Is this a change from baseline?: Pre-admission baseline Grooming: Needs assistance Is this a change from baseline?: Pre-admission baseline Feeding: Needs assistance Is this a change from baseline?: Pre-admission baseline Bathing: Needs assistance Is this a change from baseline?: Pre-admission baseline Toileting: Needs assistance Is this a change from baseline?: Pre-admission baseline In/Out Bed: Needs assistance Is this a change from baseline?: Pre-admission baseline Walks in Home: Needs assistance Is this a change from baseline?: Pre-admission baseline Does the patient have difficulty walking or climbing stairs?: Yes Weakness of Legs: Both Weakness of Arms/Hands: Right  Permission Sought/Granted Permission sought to share information with : Other (comment) Permission granted to share information with : Yes, Verbal Permission Granted Permission granted to share info w AGENCY:  [place]  Emotional Assessment Appearance:: Appears stated age Orientation: : Oriented to Self,Oriented to Place,Oriented to  Time,Oriented to Situation Alcohol / Substance Use: Not Applicable  Psych Involvement: No (comment)  Admission diagnosis:  Status post right shoulder hemiarthroplasty [Z96.611] Patient Active Problem List   Diagnosis Date Noted  . Status post right shoulder hemiarthroplasty 03/17/2021  . Leg pain, lateral, left 10/08/2020  . Abscess of left thumb 06/17/2020  . Cellulitis and abscess of left leg 06/17/2020  . Herpes zoster 02/18/2020  . GERD (gastroesophageal reflux disease) 11/25/2019  . Wheezing 10/26/2017  . Sciatica of left side 08/29/2017  . Long term (current) use of anticoagulants 08/02/2017  . Morbid obesity ([place]) 07/11/2017  . Hyperglycemia 07/11/2017  . Low back pain 06/19/2017  . CRF (chronic renal failure), stage 4 (severe) ([place]) 06/19/2017  . Hypokalemia 06/19/2017  . Acute lower UTI 06/19/2017  . Anemia of chronic disease 02/16/2017  . Malignant neoplasm of upper-outer quadrant of left breast in female, estrogen receptor positive ([place]) 02/10/2017  . Essential hypertension 04/05/2016  . Dyslipidemia 04/05/2016  . Chronic atrial fibrillation ([place]) 04/05/2016  . Pulmonary hypertension ([place]) 04/05/2016  . Lymphedema 04/05/2016   PCP:  Cassandria Anger, MD Pharmacy:   [place], [place] - [address] [address] Phone: [phone] Fax: [phone]  [facility] - [place], [address] [place] [address] Phone: [phone] Fax: [phone]  [id number] - [place], [place] [address] [place] [address] Phone: [phone] Fax: [phone]  Readmission Risk Interventions Readmission Risk Prevention Plan 03/19/2021  Transportation Screening Complete  HRI or Home  Care Consult Complete  Social Work Consult for [place] Planning/Counseling Complete  Palliative Care Screening Not Applicable  Medication Review Press photographer) Complete  Some recent data might be hidden

## 2021-03-19 NOTE — Progress Notes (Signed)
  Echocardiogram 2D Echocardiogram has been performed.  Katelyn Jackson G Katelyn Jackson 03/19/2021, 11:55 AM

## 2021-03-19 NOTE — Progress Notes (Signed)
ANTICOAGULATION CONSULT NOTE - Initial Consult  Pharmacy Consult for warfarin Indication: atrial fibrillation  Allergies  Allergen Reactions  . Chlorhexidine Gluconate Hives  . Penicillins Itching    Has patient had a PCN reaction causing immediate rash, facial/tongue/throat swelling, SOB or lightheadedness with hypotension: no Has patient had a PCN reaction causing severe rash involving mucus membranes or skin necrosis: No Has patient had a PCN reaction that required hospitalization: No Has patient had a PCN reaction occurring within the last 10 years: No If all of the above answers are "NO", then may proceed with Cephalosporin use.  Tolerated Cephalosporin Date: 03/17/21.     Patient Measurements: Height: 5\' 1"  (154.9 cm) Weight: 114.8 kg (253 lb) IBW/kg (Calculated) : 47.8 Heparin Dosing Weight:   Vital Signs: BP: 120/62 (05/13 1000) Pulse Rate: 57 (05/13 0540)  Labs: Recent Labs    03/17/21 1205 03/17/21 1703 03/18/21 0324 03/18/21 1256 03/19/21 0335  HGB 9.4*  --  7.8* 5.9* 8.0*  HCT 32.8*  --  27.2* 21.0* 27.2*  PLT 247  --  167  --  152  APTT 29  --   --   --   --   LABPROT 23.8*  --  24.2*  --  29.5*  INR 2.1*  --  2.2*  --  2.8*  CREATININE  --  1.67* 1.74*  --  1.84*    Estimated Creatinine Clearance: 31.1 mL/min (A) (by C-G formula based on SCr of 1.84 mg/dL (H)).   Medical History: Past Medical History:  Diagnosis Date  . Arthritis   . Cancer [facility])    breast cancer  . Chronic atrial fibrillation ([place])   . Chronic kidney disease    sees Dr Florene Glen  . Cramps, muscle, general   . Dyspnea on exertion   . Dysrhythmia    a-fib,   . GERD (gastroesophageal reflux disease)   . Headache   . Hyperlipidemia   . Hypertension   . Hypothyroidism   . Lymphedema   . Moderate to severe pulmonary hypertension ([place])   . Obesity   . Personal history of radiation therapy   . Pneumonia 12/2020  . Presence of permanent cardiac pacemaker 01/21/2021   for  bradycardia   . Syncope 12/2020   needed a pacemaker    Medications:  Medications Prior to Admission  Medication Sig Dispense Refill Last Dose  . carvedilol (COREG) 6.25 MG tablet Take 1 tablet (6.25 mg total) by mouth 2 (two) times daily with a meal. Overdue for Annual appt must see provider for future refills 180 tablet 3 03/16/2021 at Unknown time  . enoxaparin (LOVENOX) 120 MG/0.8ML injection Inject 0.8 mLs (120 mg total) into the skin daily for 7 doses. 5.6 mL 0 03/16/2021 at 0700  . exemestane (AROMASIN) 25 MG tablet Take 1 tablet (25 mg total) by mouth daily after breakfast. 90 tablet 3 03/16/2021 at Unknown time  . ferrous sulfate 325 (65 FE) MG tablet Take 325 mg by mouth daily with breakfast.   Past Week at Unknown time  . levothyroxine (SYNTHROID) 50 MCG tablet Take 1 tablet (50 mcg total) by mouth daily before breakfast. Overdue for Annual appt must see provider for future refills 90 tablet 3 03/16/2021 at Unknown time  . magnesium oxide (MAG-OX) 400 MG tablet Take 400 mg by mouth 2 (two) times daily.   Past Week at Unknown time  . omeprazole (PRILOSEC) 40 MG capsule Take 1 capsule (40 mg total) by mouth daily. Overdue for Annual appt  must see provider for future refills (Patient taking differently: Take 40 mg by mouth daily as needed (acid reflux). Overdue for Annual appt must see provider for future refills) 90 capsule 3 Past Week at Unknown time  . polyethylene glycol powder (GLYCOLAX/MIRALAX) powder mix 1 capful (17 grams) IN 8 ounces OF liquid EVERY DAY (Patient taking differently: Take 17 g by mouth daily. mix 1 capful (17 grams) IN 8 ounces OF liquid EVERY DAY) 1581 g 3 03/16/2021 at Unknown time  . potassium chloride (KLOR-CON) 8 MEQ tablet Take 1 tablet (8 mEq total) by mouth daily. 90 tablet 3 Past Week at Unknown time  . torsemide (DEMADEX) 20 MG tablet Take 20 mg by mouth 3 (three) times a week.   03/16/2021 at Unknown time  . traMADol (ULTRAM) 50 MG tablet Take 1 tablet (50 mg  total) by mouth every 12 (twelve) hours. (Patient taking differently: Take 50 mg by mouth every 12 (twelve) hours as needed for severe pain.) 180 tablet 1 03/17/2021 at 0600  . allopurinol (ZYLOPRIM) 100 MG tablet Take 0.5 tablets (50 mg total) by mouth daily. 45 tablet 3 03/13/2021  . Cholecalciferol (VITAMIN D3) 2000 units capsule Take 1 capsule (2,000 Units total) by mouth daily. 100 capsule 3 03/12/2021  . Cyanocobalamin (VITAMIN B-12 PO) Take 2,000 mcg by mouth daily.   03/12/2021  . diclofenac Sodium (VOLTAREN) 1 % GEL APPLY 4 GRAMS FOUR TIMES DAILY AS NEEDED (Patient taking differently: Apply 4 g topically 4 (four) times daily as needed (pain). APPLY 4 GRAMS FOUR TIMES DAILY AS NEEDED) 100 g 5 Unknown at Unknown time  . lipase/protease/amylase (CREON) 12000-38000 units CPEP capsule Take 1 capsule (12,000 Units total) by mouth daily as needed (stomach problems). 270 capsule 3 03/13/2021  . lovastatin (MEVACOR) 20 MG tablet Take 1 tablet (20 mg total) by mouth daily. 90 tablet 3 03/12/2021  . methocarbamol (ROBAXIN) 500 MG tablet Take 1 tablet (500 mg total) by mouth 2 (two) times daily. (Patient taking differently: Take 500 mg by mouth 2 (two) times daily as needed for muscle spasms.) 20 tablet 0 More than a month at Unknown time  . OVER THE COUNTER MEDICATION Apply 1 application topically daily as needed (apply to buttocks  as needed for irritaiton). Intensive Skin Care Therapy   Unknown at Unknown time  . warfarin (COUMADIN) 5 MG tablet Take 1 tablet daily except take 1/2 tablet on Wed and Sat or Take as directed by anticoagulation clinic (Patient taking differently: Take 2.5 mg by mouth daily. Take as directed by anticoagulation clinic) 90 tablet 1 03/12/2021    Assessment: 75 yo F on warfarin PTA for Afib.  PTA she was on a lovenox bridge while warfarin was held for surgery. S/p R shoulder surgery 5/11 PM.  Pharmacy consulted to manage anticoagulation post op. LMWH 120 mg qda 5/5>>5/12 PTA for bridge  therapy, LD 5/10 at 07 am.   PTA warfarin dose 2.5 mg daily (taking 1/2 of 5 mg tablet). LD Friday 03/12/21.  03/19/2021 INR 2.8 - therapeutic but significantly increased.  Again requiring transfusion yesterday with Hgb back to 8 today, post op ABLA  Goal of Therapy:  INR 2-3 Monitor platelets by anticoagulation protocol: Yes   Plan:  Continue to hold warfarin today with significant increase in INR despite holding warfarin yesterday Discuss with MD before resuming warfarin Daily INR  Ulice Dash D  03/19/21 1:57 PM

## 2021-03-20 DIAGNOSIS — Z96611 Presence of right artificial shoulder joint: Secondary | ICD-10-CM | POA: Diagnosis not present

## 2021-03-20 DIAGNOSIS — I482 Chronic atrial fibrillation, unspecified: Secondary | ICD-10-CM | POA: Diagnosis not present

## 2021-03-20 DIAGNOSIS — D638 Anemia in other chronic diseases classified elsewhere: Secondary | ICD-10-CM | POA: Diagnosis not present

## 2021-03-20 DIAGNOSIS — R0600 Dyspnea, unspecified: Secondary | ICD-10-CM

## 2021-03-20 DIAGNOSIS — N184 Chronic kidney disease, stage 4 (severe): Secondary | ICD-10-CM | POA: Diagnosis not present

## 2021-03-20 LAB — BASIC METABOLIC PANEL
Anion gap: 9 (ref 5–15)
BUN: 22 mg/dL (ref 8–23)
CO2: 27 mmol/L (ref 22–32)
Calcium: 10 mg/dL (ref 8.9–10.3)
Chloride: 101 mmol/L (ref 98–111)
Creatinine, Ser: 1.59 mg/dL — ABNORMAL HIGH (ref 0.44–1.00)
GFR, Estimated: 34 mL/min — ABNORMAL LOW (ref >60)
Glucose, Bld: 86 mg/dL (ref 70–99)
Potassium: 3.8 mmol/L (ref 3.5–5.1)
Sodium: 137 mmol/L (ref 135–145)

## 2021-03-20 LAB — PROTIME-INR
INR: 2.1 — ABNORMAL HIGH (ref 0.8–1.2)
Prothrombin Time: 23.6 seconds — ABNORMAL HIGH (ref 11.4–15.2)

## 2021-03-20 LAB — CBC
HCT: 31.2 % — ABNORMAL LOW (ref 36.0–46.0)
Hemoglobin: 9.1 g/dL — ABNORMAL LOW (ref 12.0–15.0)
MCH: 26.9 pg (ref 26.0–34.0)
MCHC: 29.2 g/dL — ABNORMAL LOW (ref 30.0–36.0)
MCV: 92.3 fL (ref 80.0–100.0)
Platelets: 182 10*3/uL (ref 150–400)
RBC: 3.38 MIL/uL — ABNORMAL LOW (ref 3.87–5.11)
RDW: 16.8 % — ABNORMAL HIGH (ref 11.5–15.5)
WBC: 7.1 10*3/uL (ref 4.0–10.5)
nRBC: 0 % (ref 0.0–0.2)

## 2021-03-20 NOTE — Progress Notes (Signed)
SPORTS MEDICINE AND JOINT REPLACEMENT  Lara Mulch, MD    Carlyon Shadow, PA-C [place], [place],   [address]                             [phone]   PROGRESS NOTE  Subjective:  negative for Chest Pain  negative for Shortness of Breath  negative for Nausea/Vomiting   negative for Calf Pain  negative for Bowel Movement   Tolerating Diet: yes         Patient reports pain as 3 on 0-10 scale.    Objective: Vital signs in last 24 hours:   Patient Vitals for the past 24 hrs:  BP Temp Pulse Resp SpO2  03/20/21 0551 139/72 98.3 F (36.8 C) 60 16 96 %  03/19/21 2134 117/62 97.6 F (36.4 C) 61 16 95 %  03/19/21 1513 124/65 98.4 F (36.9 C) 60 17 97 %  03/19/21 1000 120/62 -- -- -- 100 %    @flow {1959:LAST@   Intake/Output from previous day:   05/13 0701 - 05/14 0700 In: 1400 [P.O.:1400] Out: 350 [Urine:350]   Intake/Output this shift:   No intake/output data recorded.   Intake/Output      05/13 0701 05/14 0700 05/14 0701 05/15 0700   P.O. 1400    I.V. (mL/kg) 0 (0)    Blood     IV Piggyback     Total Intake(mL/kg) 1400 (12.2)    Urine (mL/kg/hr) 350 (0.1)    Stool 0    Total Output 350    Net +1050         Urine Occurrence 1 x    Stool Occurrence 0 x       LABORATORY DATA: Recent Labs    03/17/21 1205 03/18/21 0324 03/18/21 1256 03/19/21 0335  WBC 4.9 7.8  --  9.0  HGB 9.4* 7.8* 5.9* 8.0*  HCT 32.8* 27.2* 21.0* 27.2*  PLT 247 167  --  152   Recent Labs    03/17/21 1703 03/18/21 0324 03/19/21 0335  NA 138 136 137  K 3.9 4.3 4.0  CL 102 101 100  CO2 28 26 30   BUN 17 18 22   CREATININE 1.67* 1.74* 1.84*  GLUCOSE 111* 148* 117*  CALCIUM 9.6 9.5 9.4   Lab Results  Component Value Date   INR 2.8 (H) 03/19/2021   INR 2.2 (H) 03/18/2021   INR 2.1 (H) 03/17/2021    Examination:  General appearance: alert, cooperative and no distress Extremities: extremities normal, atraumatic, no cyanosis or edema  Wound Exam: clean,  dry, intact   Drainage:  None: wound tissue dry  Motor Exam: Opposition, Pinch and Wrist Dorsiflexion Intact  Sensory Exam: Radial, Ulnar and Median normal   Assessment:    3 Days Post-Op  Procedure(s) (LRB): SHOULDER HEMI-ARTHROPLASTY (Right)  ADDITIONAL DIAGNOSIS:  Principal Problem:   Status post right shoulder hemiarthroplasty Active Problems:   Dyslipidemia   Chronic atrial fibrillation (HCC)   Malignant neoplasm of upper-outer quadrant of left breast in female, estrogen receptor positive (HCC)   Anemia of chronic disease   CRF (chronic renal failure), stage 4 (severe) (HCC)   Morbid obesity (HCC)   Long term (current) use of anticoagulants   GERD (gastroesophageal reflux disease)   Postoperative anemia due to acute blood loss   Dyspnea   Symptomatic anemia     Plan: Physical Therapy as ordered Non Weight Bearing (NWB)  DVT Prophylaxis:  Coumadin  DISCHARGE PLAN: Home  Patient doing well and sitting up comfortably in bedside chair. Symptoms significantly improved from yesterday. Awaiting new Hgb today. Plan to discharge home this afternoon if stable and symptoms remain normal.        Donia Ast 03/20/2021, 7:37 AM

## 2021-03-20 NOTE — Progress Notes (Signed)
Occupational Therapy Treatment Patient Details Name: Katelyn Jackson MRN: [id number] DOB: [birth year] Today's Date: 03/20/2021    History of present illness Patient is a 75 year old female admitted for R shoulder hemiarthroplasty. PMH significant for breast CA, multiple falls, s/p pacemaker, morbid obesity   OT comments  This 75 yo female admitted and underwent above seen today to go over UB/LBD. Pt A'd as able and educated on sequencing of getting dressed and undressed. No further acute OT needs, HHOT continues to be recommended.    Follow Up Recommendations  Home health OT;Supervision/Assistance - 24 hour;Other (comment)    Equipment Recommendations  None recommended by OT       Precautions / Restrictions Precautions Precautions: Fall;Shoulder Type of Shoulder Precautions: AROM elbow, wrist, hand ok. no ROM at shoulder Shoulder Interventions: Shoulder sling/immobilizer;Off for dressing/bathing/exercises Precaution Booklet Issued: Yes (comment) Precaution Comments: reports no falls since Feb, had 5-6 before pacemaker surgery in Feb. (multiple falls prior to PM) Required Braces or Orthoses: Other Brace Other Brace: post op shoe (on LLE) Restrictions Weight Bearing Restrictions: Yes RUE Weight Bearing: Non weight bearing Other Position/Activity Restrictions: WBAT on RLE in post op shoe       Mobility Bed Mobility               General bed mobility comments: OOB in recliner upon arrival    Transfers Overall transfer level: Needs assistance Equipment used: Quad cane Transfers: Sit to/from Stand Sit to Stand: Min guard              Balance Overall balance assessment: Needs assistance Sitting-balance support: No upper extremity supported;Feet supported Sitting balance-Leahy Scale: Good     Standing balance support: Single extremity supported Standing balance-Leahy Scale: Poor Standing balance comment: reliant on UE support                            ADL either performed or assessed with clinical judgement   ADL Overall ADL's : Needs assistance/impaired                 Upper Body Dressing : Maximal assistance;Sitting Upper Body Dressing Details (indicate cue type and reason): Pt able to state she understood getting pullover shirt on and off after we practiced getting one on; Min A to doff sling, total A to donn sling with pt able to follow directions to help place sling Lower Body Dressing: Maximal assistance Lower Body Dressing Details (indicate cue type and reason): min guard A sit<>stand                     Vision Patient Visual Report: No change from baseline            Cognition Arousal/Alertness: Awake/alert Behavior During Therapy: WFL for tasks assessed/performed Overall Cognitive Status: Within Functional Limits for tasks assessed                                          Exercises  Pt performed hand, wrist, forearm, and elbow exercises seated in recliner, 10 reps of each with S.           Pertinent Vitals/ Pain       Pain Assessment: 0-10 Pain Score: 2  Pain Location: R shoulder Pain Descriptors / Indicators: Sore Pain Intervention(s): Limited activity within patient's tolerance;Monitored during session  Frequency  Min 2X/week        Progress Toward Goals  OT Goals(current goals can now be found in the care plan section)  Progress towards OT goals: Progressing toward goals  Acute Rehab OT Goals Patient Stated Goal: to go home today OT Goal Formulation: With patient Time For Goal Achievement: 04/01/21 Potential to Achieve Goals: Good  Plan Discharge plan remains appropriate       AM-PAC OT "6 Clicks" Daily Activity     Outcome Measure   Help from another person eating meals?: A Little Help from another person taking care of personal grooming?: A Little Help from another person toileting, which includes using toliet, bedpan, or urinal?: A Lot Help  from another person bathing (including washing, rinsing, drying)?: A Lot Help from another person to put on and taking off regular upper body clothing?: A Lot Help from another person to put on and taking off regular lower body clothing?: Total 6 Click Score: 13    End of Session Equipment Utilized During Treatment: Other (comment) (sling RUE, post op shoe LLE)  OT Visit Diagnosis: Unsteadiness on feet (R26.81);Other abnormalities of gait and mobility (R26.89);Repeated falls (R29.6);History of falling (Z91.81);Pain Pain - Right/Left: Right Pain - part of body: Shoulder   Activity Tolerance Patient tolerated treatment well   Patient Left in chair;with call bell/phone within reach   Nurse Communication  (should have post op shoe on LLE when ambulating)        Time: 3735-7897 OT Time Calculation (min): 19 min  Charges: OT General Charges $OT Visit: 1 Visit OT Treatments $Self Care/Home Management : 8-22 mins  [place] [address], OTR/L Acute [employer] Pager [phone] Office [phone]      Almon Register 03/20/2021, 10:27 AM

## 2021-03-20 NOTE — Discharge Summary (Signed)
SPORTS MEDICINE & JOINT REPLACEMENT   Lara Mulch, MD   Carlyon Shadow, PA-C [place], [employer] [place], [place]  [address]                             [phone]  PATIENT ID: Katelyn Jackson        MRN:  [id number]          DOB/AGE: [birth year] / 75 y.o.    DISCHARGE SUMMARY  ADMISSION DATE:    03/17/2021 DISCHARGE DATE:   03/20/2021   ADMISSION DIAGNOSIS: Status post right shoulder hemiarthroplasty [Z96.611]    DISCHARGE DIAGNOSIS:  RIGHT HUMERUS AND SCAPULA FRACTURES    ADDITIONAL DIAGNOSIS: Principal Problem:   Status post right shoulder hemiarthroplasty Active Problems:   Dyslipidemia   Chronic atrial fibrillation (HCC)   Malignant neoplasm of upper-outer quadrant of left breast in female, estrogen receptor positive ([place])   Anemia of chronic disease   CRF (chronic renal failure), stage 4 (severe) (HCC)   Morbid obesity ([place])   Long term (current) use of anticoagulants   GERD (gastroesophageal reflux disease)   Postoperative anemia due to acute blood loss   Dyspnea   Symptomatic anemia  Past Medical History:  Diagnosis Date  . Arthritis   . Cancer [facility])    breast cancer  . Chronic atrial fibrillation ([place])   . Chronic kidney disease    sees Dr Florene Glen  . Cramps, muscle, general   . Dyspnea on exertion   . Dysrhythmia    a-fib,   . GERD (gastroesophageal reflux disease)   . Headache   . Hyperlipidemia   . Hypertension   . Hypothyroidism   . Lymphedema   . Moderate to severe pulmonary hypertension ([place])   . Obesity   . Personal history of radiation therapy   . Pneumonia 12/2020  . Presence of permanent cardiac pacemaker 01/21/2021   for bradycardia   . Syncope 12/2020   needed a pacemaker    PROCEDURE: Procedure(s): SHOULDER HEMI-ARTHROPLASTY on 03/17/2021  CONSULTS: Treatment Team:  Jonnie Finner, DO   HISTORY:  See H&P in chart  HOSPITAL COURSE:  Samyria Jackson is a 75 y.o. admitted on 03/17/2021 and found to have a diagnosis of [place].  After appropriate laboratory studies were obtained  they were taken to the operating room on 03/17/2021 and underwent Procedure(s): SHOULDER HEMI-ARTHROPLASTY.   They were given perioperative antibiotics:  Anti-infectives (From admission, onward)   Start     Dose/Rate Route Frequency Ordered Stop   03/17/21 2100  ceFAZolin (ANCEF) IVPB 2g/100 mL premix        2 g 200 mL/hr over 30 Minutes Intravenous Every 6 hours 03/17/21 1816 03/18/21 1109   03/17/21 1506  ceFAZolin (ANCEF) 2-4 GM/100ML-% IVPB       Note to Pharmacy: Minor, Anneita   : cabinet override      03/17/21 1506 03/17/21 1538   03/17/21 1412  vancomycin (VANCOCIN) powder  Status:  Discontinued          As needed 03/17/21 1412 03/17/21 1816   03/17/21 1130  ceFAZolin (ANCEF) IVPB 2g/100 mL premix        2 g 200 mL/hr over 30 Minutes Intravenous On call to O.R. 03/17/21 1128 03/17/21 1510    .  Patient given tranexamic acid IV or topical and exparel intra-operatively.  Tolerated the procedure well.    POD# 1: Vital signs were stable.  Patient denied Chest pain, shortness of breath, or calf pain.  Patient was started on Aspirin twice daily at 8am.  Consults to PT, OT, and care management were made.  The patient was weight bearing as tolerated.  CPM was placed on the operative leg 0-90 degrees for 6-8 hours a day. When out of the CPM, patient was placed in the foam block to achieve full extension. Incentive spirometry was taught.  Dressing was changed.       POD #2, Continued  PT for ambulation and exercise program.  IV saline locked.  O2 discontinued.    The remainder of the hospital course was dedicated to ambulation and strengthening.   The patient was discharged on 3 Days Post-Op in  Good condition.  Blood products given:2 units CC PRBC  DIAGNOSTIC STUDIES: Recent vital signs:  Patient Vitals for the past 24 hrs:  BP Temp Temp src Pulse Resp SpO2  03/20/21 1517 124/60 97.7 F (36.5 C)  Oral (!) 52 19 96 %  03/20/21 0551 139/72 98.3 F (36.8 C) -- 60 16 96 %  03/19/21 2134 117/62 97.6 F (36.4 C) -- 61 16 95 %       Recent laboratory studies: Recent Labs    03/17/21 1205 03/18/21 0324 03/18/21 1256 03/19/21 0335 03/20/21 0825  WBC 4.9 7.8  --  9.0 7.1  HGB 9.4* 7.8* 5.9* 8.0* 9.1*  HCT 32.8* 27.2* 21.0* 27.2* 31.2*  PLT 247 167  --  152 182   Recent Labs    03/17/21 1703 03/18/21 0324 03/19/21 0335 03/20/21 0825  NA 138 136 137 137  K 3.9 4.3 4.0 3.8  CL 102 101 100 101  CO2 28 26 30 27   BUN 17 18 22 22   CREATININE 1.67* 1.74* 1.84* 1.59*  GLUCOSE 111* 148* 117* 86  CALCIUM 9.6 9.5 9.4 10.0   Lab Results  Component Value Date   INR 2.1 (H) 03/20/2021   INR 2.8 (H) 03/19/2021   INR 2.2 (H) 03/18/2021     Recent Radiographic Studies :  DG CHEST PORT 1 VIEW  Result Date: 03/18/2021 CLINICAL DATA:  Shortness of breath EXAM: PORTABLE CHEST 1 VIEW COMPARISON:  01/07/2021 FINDINGS: Heart is enlarged. Left pacer remains in place, unchanged. There is vascular congestion. Interstitial prominence throughout the lungs, likely interstitial edema. Possible small right pleural effusion. No acute bony abnormality. IMPRESSION: Cardiomegaly with vascular congestion and interstitial prominence, likely interstitial edema/CHF. Small right effusion. Electronically Signed   By: Katelyn Jackson M.D.   On: 03/18/2021 11:44   DG Shoulder Right Port  Result Date: 03/17/2021 CLINICAL DATA:  Status post right shoulder replacement. EXAM: PORTABLE RIGHT SHOULDER COMPARISON:  Preoperative CT 02/17/2021 FINDINGS: Right humeral head arthroplasty. The arthroplasty appears slightly inferiorly located with respect to the glenoid. Fracture at the base of the a chromium was better appreciated on prior CT. Presumed wound VAC in place. IMPRESSION: 1. Right humeral head arthroplasty. The arthroplasty appears slightly inferiorly located with respect to the glenoid. 2. Fracture at the base of the  acromion better appreciated on prior CT. Electronically Signed   By: Katelyn Jackson M.D.   On: 03/17/2021 17:33   DG Foot Complete Left  Result Date: 03/19/2021 CLINICAL DATA:  History of LEFT foot fracture EXAM: LEFT FOOT - COMPLETE 3+ VIEW COMPARISON:  January 19, 2021 FINDINGS: Osteopenia. Mild irregularity of the lateral aspect of the base of the first proximal phalanx consistent with reported healing fracture. No additional acute fracture or dislocation noted.  Contour deformities of the heads of the fourth and fifth metatarsals consistent with the sequela of prior fractures. Vascular calcifications. Pes planus. Midfoot degenerative changes. Soft tissue edema. IMPRESSION: Healing fractures of the base of the first proximal phalanx and the head of fourth and fifth metatarsals. Electronically Signed   By: Valentino Saxon MD   On: 03/19/2021 15:19   ECHOCARDIOGRAM COMPLETE  Result Date: 03/19/2021    ECHOCARDIOGRAM REPORT   Patient Name:   NELY DEDMON Date of Exam: 03/19/2021 Medical Rec #:  [id number]         Height:       61.0 in Accession #:    [id number]        Weight:       253.0 lb Date of Birth:  August 27, 1946         BSA:          2.087 m Patient Age:    73 years          BP:           117/48 mmHg Patient Gender: F                 HR:           54 bpm. Exam Location:  Inpatient Procedure: 2D Echo, Cardiac Doppler and Color Doppler Indications:    I50.23 Acute on chronic systolic (congestive) heart failure  History:        Patient has prior history of Echocardiogram examinations, most                 recent 05/14/2015. Pacemaker, Pulmonary HTN, Arrythmias:Atrial                 Fibrillation, Signs/Symptoms:Syncope; Risk Factors:Hypertension                 and Dyslipidemia. Cancer. GERD. Hypothyroidism.  Sonographer:    Tiffany Dance Referring Phys: [id number] [place]  1. Left ventricular ejection fraction, by estimation, is 45 to 50%. The left ventricle has mildly decreased  function. The left ventricle demonstrates global hypokinesis. The left ventricular internal cavity size was moderately dilated. Left ventricular diastolic parameters are indeterminate.  2. Right ventricular systolic function is mildly reduced. The right ventricular size is mildly enlarged. There is mildly elevated pulmonary artery systolic pressure.  3. Left atrial size was severely dilated.  4. Right atrial size was severely dilated.  5. The mitral valve is normal in structure. Severe mitral valve regurgitation. No evidence of mitral stenosis.  6. The aortic valve is normal in structure. There is mild calcification of the aortic valve. Aortic valve regurgitation is trivial. No aortic stenosis is present.  7. The inferior vena cava is normal in size with greater than 50% respiratory variability, suggesting right atrial pressure of 3 mmHg. FINDINGS  Left Ventricle: Left ventricular ejection fraction, by estimation, is 45 to 50%. The left ventricle has mildly decreased function. The left ventricle demonstrates global hypokinesis. The left ventricular internal cavity size was moderately dilated. There is no left ventricular hypertrophy. Left ventricular diastolic function could not be evaluated due to atrial fibrillation. Left ventricular diastolic parameters are indeterminate. Right Ventricle: The right ventricular size is mildly enlarged. No increase in right ventricular wall thickness. Right ventricular systolic function is mildly reduced. There is mildly elevated pulmonary artery systolic pressure. The tricuspid regurgitant  velocity is 2.70 m/s, and with an assumed right atrial pressure of 15 mmHg, the estimated right ventricular systolic pressure is 44.2  mmHg. Left Atrium: Left atrial size was severely dilated. Right Atrium: Right atrial size was severely dilated. Pericardium: There is no evidence of pericardial effusion. Mitral Valve: The mitral valve is normal in structure. Mild mitral annular calcification.  Severe mitral valve regurgitation, with centrally-directed jet. No evidence of mitral valve stenosis. Tricuspid Valve: The tricuspid valve is normal in structure. Tricuspid valve regurgitation is mild . No evidence of tricuspid stenosis. Aortic Valve: The aortic valve is normal in structure. There is mild calcification of the aortic valve. Aortic valve regurgitation is trivial. No aortic stenosis is present. Pulmonic Valve: The pulmonic valve was normal in structure. Pulmonic valve regurgitation is trivial. No evidence of pulmonic stenosis. Aorta: The aortic root is normal in size and structure. Venous: The inferior vena cava is normal in size with greater than 50% respiratory variability, suggesting right atrial pressure of 3 mmHg. IAS/Shunts: No atrial level shunt detected by color flow Doppler.  LEFT VENTRICLE PLAX 2D LVIDd:         6.10 cm LVIDs:         4.40 cm LV PW:         1.20 cm LV IVS:        1.10 cm LVOT diam:     2.00 cm LV SV:         74 LV SV Index:   35 LVOT Area:     3.14 cm  RIGHT VENTRICLE            IVC RV Basal diam:  3.40 cm    IVC diam: 3.30 cm RV Mid diam:    2.70 cm RV S prime:     9.79 cm/s TAPSE (M-mode): 2.0 cm LEFT ATRIUM              Index        RIGHT ATRIUM           Index LA diam:        5.80 cm  2.78 cm/m   RA Area:     24.70 cm LA Vol (A2C):   218.0 ml 104.45 ml/m RA Volume:   74.80 ml  35.84 ml/m LA Vol (A4C):   142.0 ml 68.03 ml/m LA Biplane Vol: 180.0 ml 86.24 ml/m  AORTIC VALVE LVOT Vmax:   120.50 cm/s LVOT Vmean:  79.250 cm/s LVOT VTI:    0.234 m  AORTA Ao Root diam: 3.10 cm Ao Asc diam:  3.75 cm MITRAL VALVE                TRICUSPID VALVE MV Area (PHT): 3.83 cm     TR Peak grad:   29.2 mmHg MV Decel Time: 198 msec     TR Vmax:        270.00 cm/s MV E velocity: 165.00 cm/s MV A velocity: 40.80 cm/s   SHUNTS MV E/A ratio:  4.04         Systemic VTI:  0.23 m                             Systemic Diam: 2.00 cm Glori Bickers MD Electronically signed by Glori Bickers  MD Signature Date/Time: 03/19/2021/3:31:00 PM    Final     DISCHARGE INSTRUCTIONS: Discharge Instructions    Call MD / Call 911   Complete by: As directed    If you experience chest pain or shortness of breath, CALL 911 and be transported to the hospital emergency room.  If you develope a fever above 101 F, pus (white drainage) or increased drainage or redness at the wound, or calf pain, call your surgeon's office.   Constipation Prevention   Complete by: As directed    Drink plenty of fluids.  Prune juice may be helpful.  You may use a stool softener, such as Colace (over the counter) 100 mg twice a day.  Use MiraLax (over the counter) for constipation as needed.   Diet - low sodium heart healthy   Complete by: As directed    Discharge instructions   Complete by: As directed    Take all medications as prescribed  Keep wound clean and dry  Follow instructions given to you by Dr Griffin Basil   Increase activity slowly as tolerated   Complete by: As directed    Post-operative opioid taper instructions:   Complete by: As directed    POST-OPERATIVE OPIOID TAPER INSTRUCTIONS: It is important to wean off of your opioid medication as soon as possible. If you do not need pain medication after your surgery it is ok to stop day one. Opioids include: Codeine, Hydrocodone(Norco, Vicodin), Oxycodone(Percocet, oxycontin) and hydromorphone amongst others.  Long term and even short term use of opiods can cause: Increased pain response Dependence Constipation Depression Respiratory depression And more.  Withdrawal symptoms can include Flu like symptoms Nausea, vomiting And more Techniques to manage these symptoms Hydrate well Eat regular healthy meals Stay active Use relaxation techniques(deep breathing, meditating, yoga) Do Not substitute Alcohol to help with tapering If you have been on opioids for less than two weeks and do not have pain than it is ok to stop all together.  Plan to wean off  of opioids This plan should start within one week post op of your joint replacement. Maintain the same interval or time between taking each dose and first decrease the dose.  Cut the total daily intake of opioids by one tablet each day Next start to increase the time between doses. The last dose that should be eliminated is the evening dose.         DISCHARGE MEDICATIONS:   Allergies as of 03/20/2021      Reactions   Chlorhexidine Gluconate Hives   Penicillins Itching   Has patient had a PCN reaction causing immediate rash, facial/tongue/throat swelling, SOB or lightheadedness with hypotension: no Has patient had a PCN reaction causing severe rash involving mucus membranes or skin necrosis: No Has patient had a PCN reaction that required hospitalization: No Has patient had a PCN reaction occurring within the last 10 years: No If all of the above answers are "NO", then may proceed with Cephalosporin use. Tolerated Cephalosporin Date: 03/17/21.      Medication List    STOP taking these medications   enoxaparin 120 MG/0.8ML injection Commonly known as: LOVENOX     TAKE these medications   acetaminophen 500 MG tablet Commonly known as: TYLENOL Take 2 tablets (1,000 mg total) by mouth every 8 (eight) hours for 14 days.   allopurinol 100 MG tablet Commonly known as: ZYLOPRIM Take 0.5 tablets (50 mg total) by mouth daily.   carvedilol 6.25 MG tablet Commonly known as: COREG Take 1 tablet (6.25 mg total) by mouth 2 (two) times daily with a meal. Overdue for Annual appt must see provider for future refills   diclofenac Sodium 1 % Gel Commonly known as: VOLTAREN APPLY 4 GRAMS FOUR TIMES DAILY AS NEEDED What changed:   how much to take  how to  take this  when to take this  reasons to take this   exemestane 25 MG tablet Commonly known as: AROMASIN Take 1 tablet (25 mg total) by mouth daily after breakfast.   ferrous sulfate 325 (65 FE) MG tablet Take 325 mg by mouth  daily with breakfast.   levothyroxine 50 MCG tablet Commonly known as: SYNTHROID Take 1 tablet (50 mcg total) by mouth daily before breakfast. Overdue for Annual appt must see provider for future refills   lipase/protease/amylase 12000-38000 units Cpep capsule Commonly known as: CREON Take 1 capsule (12,000 Units total) by mouth daily as needed (stomach problems).   lovastatin 20 MG tablet Commonly known as: MEVACOR Take 1 tablet (20 mg total) by mouth daily.   magnesium oxide 400 MG tablet Commonly known as: MAG-OX Take 400 mg by mouth 2 (two) times daily.   methocarbamol 500 MG tablet Commonly known as: Robaxin Take 1 tablet (500 mg total) by mouth every 8 (eight) hours as needed for muscle spasms. What changed:   when to take this  reasons to take this   omeprazole 40 MG capsule Commonly known as: PRILOSEC Take 1 capsule (40 mg total) by mouth daily. Overdue for Annual appt must see provider for future refills What changed:   when to take this  reasons to take this   OVER THE COUNTER MEDICATION Apply 1 application topically daily as needed (apply to buttocks  as needed for irritaiton). Intensive Skin Care Therapy   polyethylene glycol powder 17 GM/SCOOP powder Commonly known as: GLYCOLAX/MIRALAX mix 1 capful (17 grams) IN 8 ounces OF liquid EVERY DAY What changed:   how much to take  how to take this  when to take this   potassium chloride 8 MEQ tablet Commonly known as: KLOR-CON Take 1 tablet (8 mEq total) by mouth daily.   torsemide 20 MG tablet Commonly known as: DEMADEX Take 20 mg by mouth 3 (three) times a week.   traMADol 50 MG tablet Commonly known as: Ultram Take 1 tablet (50 mg total) by mouth every 8 (eight) hours as needed for severe pain. What changed:   when to take this  reasons to take this   VITAMIN B-12 PO Take 2,000 mcg by mouth daily.   Vitamin D3 50 MCG (2000 UT) capsule Take 1 capsule (2,000 Units total) by mouth daily.    warfarin 5 MG tablet Commonly known as: COUMADIN Take as directed. If you are unsure how to take this medication, talk to your nurse or doctor. Original instructions: Take 1 tablet daily except take 1/2 tablet on Wed and Sat or Take as directed by anticoagulation clinic What changed:   how much to take  how to take this  when to take this  additional instructions       FOLLOW UP VISIT:    Follow-up Information    Hiram Gash, MD On 03/26/2021.   Specialty: Orthopedic Surgery Why: @ [phone] for recheck right shoulder Contact information: [address] [address] [phone]               DISPOSITION: HOME VS. SNF  Dental Antibiotics:  In most cases prophylactic antibiotics for Dental procdeures after total joint surgery are not necessary.  Exceptions are as follows:  1. History of prior total joint infection  2. Severely immunocompromised (Organ Transplant, cancer chemotherapy, Rheumatoid biologic meds such as [place])  3. Poorly controlled diabetes (A1C &gt; 8.0, blood glucose over 200)  If you  have one of these conditions, contact your surgeon for an antibiotic prescription, prior to your dental procedure.   CONDITION:  Good   Donia Ast 03/20/2021, 4:34 PM

## 2021-03-20 NOTE — Progress Notes (Signed)
ANTICOAGULATION CONSULT NOTE - Follow Up Consult  Pharmacy Consult for warfarin Indication: atrial fibrillation  Allergies  Allergen Reactions  . Chlorhexidine Gluconate Hives  . Penicillins Itching    Has patient had a PCN reaction causing immediate rash, facial/tongue/throat swelling, SOB or lightheadedness with hypotension: no Has patient had a PCN reaction causing severe rash involving mucus membranes or skin necrosis: No Has patient had a PCN reaction that required hospitalization: No Has patient had a PCN reaction occurring within the last 10 years: No If all of the above answers are "NO", then may proceed with Cephalosporin use.  Tolerated Cephalosporin Date: 03/17/21.     Patient Measurements: Height: 5\' 1"  (154.9 cm) Weight: 114.8 kg (253 lb) IBW/kg (Calculated) : 47.8 Heparin Dosing Weight:   Vital Signs: Temp: 98.3 F (36.8 C) (05/14 0551) BP: 139/72 (05/14 0551) Pulse Rate: 60 (05/14 0551)  Labs: Recent Labs    03/17/21 1205 03/17/21 1703 03/18/21 0324 03/18/21 1256 03/19/21 0335 03/20/21 0825  HGB 9.4*  --  7.8* 5.9* 8.0* 9.1*  HCT 32.8*  --  27.2* 21.0* 27.2* 31.2*  PLT 247  --  167  --  152 182  APTT 29  --   --   --   --   --   LABPROT 23.8*  --  24.2*  --  29.5* 23.6*  INR 2.1*  --  2.2*  --  2.8* 2.1*  CREATININE  --    < > 1.74*  --  1.84* 1.59*   < > = values in this interval not displayed.    Estimated Creatinine Clearance: 36 mL/min (A) (by C-G formula based on SCr of 1.59 mg/dL (H)).  Assessment: 75 yo F on warfarin PTA for Afib.  PTA she was on a lovenox bridge while warfarin was held for surgery. S/p R shoulder surgery 5/11 PM.  Pharmacy consulted to manage anticoagulation post op. LMWH 120 mg qda 5/5>>5/12 PTA for bridge therapy, LD 5/10 at 07 am.   PTA warfarin dose 2.5 mg daily (taking 1/2 of 5 mg tablet). LD Friday 03/12/21.   03/20/2021  INR 2.1 - remains therapeutic, no warfarin given this admission.    Hgb improved 9.1 s/p FFP  5/11 and PRBC 5/12  No overt bleeding documented  Goal of Therapy:  INR 2-3 Monitor platelets by anticoagulation protocol: Yes   Plan:  Discussed with [facility], will continue to hold warfarin today. Recommend resuming home dose tomorrow and rechecking INR by Wednesday Daily INR while inpatient  Peggyann Juba, PharmD, BCPS Pharmacy: [phone] 03/20/21 11:05 AM

## 2021-03-20 NOTE — Progress Notes (Signed)
Nurse reviewed discharge instructions with pt.  Pt and her son verbalized understanding of discharge instructions, follow up appointments and new medications.  Pt discharged from unit in wheelchair.  No concerns at time of discharge.

## 2021-03-20 NOTE — Progress Notes (Addendum)
PROGRESS NOTE    Katelyn Jackson  DEY:[id number] DOB: Mar 22, 1946 DOA: 03/17/2021 PCP: Cassandria Anger, MD    No chief complaint on file.   Brief Narrative:  Katelyn Jackson is a 75 y.o. female with medical history significant of permanent a fib, HTN, HLD, hypothyroidism, chronic lymphedema. Presenting with right shoulder pain. She has had multiple falls in the last several months resulting in right shoulder pain. She has had limited mobility in that arm and has had a radiating burning pain down that arm. She was evaluated by orthopedics who recommended surgery. She presented for that surgery and it was completed 03/17/21. Given her multiple co-morbidities, [facility] was consulted to help with medical management.    Assessment & Plan:   Principal Problem:   Status post right shoulder hemiarthroplasty Active Problems:   Dyspnea   Symptomatic anemia   Dyslipidemia   Chronic atrial fibrillation (HCC)   Malignant neoplasm of upper-outer quadrant of left breast in female, estrogen receptor positive (HCC)   Anemia of chronic disease   CRF (chronic renal failure), stage 4 (severe) (HCC)   Morbid obesity ([place])   Long term (current) use of anticoagulants   GERD (gastroesophageal reflux disease)   Postoperative anemia due to acute blood loss  1 dyspnea -Likely multifactorial secondary to volume overload/vascular congestion in the setting of symptomatic anemia. -Patient with complaints of shortness of breath postoperatively. -Chest x-ray done concerning for pulmonary vascular congestion.  ABG within normal limits. -Patient noted to have therapeutic INR on Coumadin. -H&H obtained noted at 5.9. -Patient status post transfusion of 2 units packed red blood cells, Lasix 40 mg IV x1 with clinical improvement. -Hemoglobin currently stable at 9.1 -Symptomatic improvement. -2D echo with EF of 45 to 50%, left ventricle with global hypokinesis, left atrial size severely dilated, right atrial size  severely dilated, severe mitral valve regurgitation with no evidence of mitral stenosis, normal aortic valve structure.  2D echo similar to recent 2D echo done at outside hospital.   -Continue Demadex, Coreg.   -We will need outpatient follow-up with primary cardiologist.   -H&H stable.   2.  Symptomatic anemia/postop acute blood loss anemia/iron deficiency anemia -Postoperatively repeat H&H obtained with a hemoglobin of 5.9. -Patient noted to have complaints of shortness of breath which improved with diuresis and transfusion. -Status post transfusion 2 units packed red blood cells. -Hemoglobin currently stable at 9.1.  -Clinical improvement. -Transfusion threshold hemoglobin < 7. -Follow H&H.  3.  Permanent A. fib status post PPM on chronic anticoagulation with Coumadin -Rate controlled on Coreg.   -Coumadin per pharmacy for anticoagulation.  4.  Hypothyroidism -Synthroid.  5.  Chronic kidney disease stage IV -Close to baseline.   -Continue home regimen Demadex.   -Follow.   6.  Hyperlipidemia -Continue statin.  7.  History of breast cancer -Status post surgery and radiation.   -Outpatient follow-up.   8.  Morbid obesity -Counseled on lifestyle changes and dietary changes.  9.  Right shoulder pain status post hemiarthroplasty -Per primary team.   DVT prophylaxis: On Coumadin Code Status: Full Family Communication: Updated patient.  No family at bedside. Disposition:   Status is: Inpatient    Dispo: The patient is from: Home              Anticipated d/c is to: Home with home health              Patient currently status post right shoulder surgery, anemia status post transfusion of packed red blood cells,  respiratory status stable and improved.  Stable for discharge from a medical standpoint.    Difficult to place patient no       Consultants:   [facility] hospitalist: Dr. Marylyn Ishihara 03/18/2021  Procedures:   Plain films of the left foot 03/19/2021  Chest x-ray  03/18/2021  Plain films of the right shoulder 03/17/2021  2D echo 03/19/2021  Transfusion 2 units FFP 03/17/2021  Transfusion 2 units packed red blood cells 03/18/2021  Right shoulder hemiarthroplasty/right shoulder synovectomy with incision and debridement and open biopsy/nonoperative management of scapular fracture per Dr. Griffin Basil 03/17/2021  Antimicrobials:   IV Ancef 03/17/2021>>>> 03/18/2021   Subjective: Patient laying in bed.  Denies any chest pain.  No shortness of breath.  No abdominal pain.  Tolerating current diet.  No overt bleeding.   Objective: Vitals:   03/19/21 1000 03/19/21 1513 03/19/21 2134 03/20/21 0551  BP: 120/62 124/65 117/62 139/72  Pulse:  60 61 60  Resp:  17 16 16   Temp:  98.4 F (36.9 C) 97.6 F (36.4 C) 98.3 F (36.8 C)  TempSrc:      SpO2: 100% 97% 95% 96%  Weight:      Height:        Intake/Output Summary (Last 24 hours) at 03/20/2021 1234 Last data filed at 03/20/2021 1140 Gross per 24 hour  Intake 1530 ml  Output 200 ml  Net 1330 ml   Filed Weights   03/17/21 1133  Weight: 114.8 kg    Examination:  General exam: NAD Respiratory system: Lungs clear to auscultation bilaterally anterior lung fields.  No wheezes, no crackles, no rhonchi. Cardiovascular system: Irregularly irregular.  No JVD, no murmurs rubs or gallops.  Bilateral lower extremity lymphedema.   Gastrointestinal system: Abdomen is obese, nondistended, soft, nontender to palpation.  Positive bowel sounds.  No rebound.  No guarding.  Central nervous system: Alert and oriented. No focal neurological deficits. Extremities: Right upper extremity in sling.  Bilateral lower extremity lymphedema.   Skin: No rashes, lesions or ulcers Psychiatry: Judgement and insight appear normal. Mood & affect appropriate.     Data Reviewed: I have personally reviewed following labs and imaging studies  CBC: Recent Labs  Lab 03/17/21 1205 03/18/21 0324 03/18/21 1256 03/19/21 0335  03/20/21 0825  WBC 4.9 7.8  --  9.0 7.1  HGB 9.4* 7.8* 5.9* 8.0* 9.1*  HCT 32.8* 27.2* 21.0* 27.2* 31.2*  MCV 92.4 92.8  --  92.5 92.3  PLT 247 167  --  152 294    Basic Metabolic Panel: Recent Labs  Lab 03/17/21 1703 03/18/21 0324 03/19/21 0335 03/20/21 0825  NA 138 136 137 137  K 3.9 4.3 4.0 3.8  CL 102 101 100 101  CO2 28 26 30 27   GLUCOSE 111* 148* 117* 86  BUN 17 18 22 22   CREATININE 1.67* 1.74* 1.84* 1.59*  CALCIUM 9.6 9.5 9.4 10.0    GFR: Estimated Creatinine Clearance: 36 mL/min (A) (by C-G formula based on SCr of 1.59 mg/dL (H)).  Liver Function Tests: No results for input(s): AST, ALT, ALKPHOS, BILITOT, PROT, ALBUMIN in the last 168 hours.  CBG: No results for input(s): GLUCAP in the last 168 hours.   Recent Results (from the past 240 hour(s))  SARS CORONAVIRUS 2 (TAT 6-24 HRS) Nasopharyngeal Nasopharyngeal Swab     Status: None   Collection Time: 03/15/21  9:56 AM   Specimen: Nasopharyngeal Swab  Result Value Ref Range Status   SARS Coronavirus 2 NEGATIVE NEGATIVE Final  Comment: (NOTE) SARS-CoV-2 target nucleic acids are NOT DETECTED.  The SARS-CoV-2 RNA is generally detectable in upper and lower respiratory specimens during the acute phase of infection. Negative results do not preclude SARS-CoV-2 infection, do not rule out co-infections with other pathogens, and should not be used as the sole basis for treatment or other patient management decisions. Negative results must be combined with clinical observations, patient history, and epidemiological information. The expected result is Negative.  Fact Sheet for Patients: SugarRoll.be  Fact Sheet for Healthcare Providers: https://www.woods-mathews.com/  This test is not yet approved or cleared by the [place] FDA and  has been authorized for detection and/or diagnosis of SARS-CoV-2 by FDA under an Emergency Use Authorization (EUA). This EUA will  remain  in effect (meaning this test can be used) for the duration of the COVID-19 declaration under Se ction 564(b)(1) of the Act, 21 U.S.C. section 360bbb-3(b)(1), unless the authorization is terminated or revoked sooner.  Performed at [facility], [facility] [address], [place], [place] [address]   Aerobic/Anaerobic Culture w Gram Stain (surgical/deep wound)     Status: None (Preliminary result)   Collection Time: 03/17/21  3:08 PM   Specimen: Soft Tissue, Other  Result Value Ref Range Status   Specimen Description   Final    TISSUE RIGHT SOULDER NO. 1 Performed at [facility] [address], [place], [place] [address]    Special Requests   Final    HOLD 3 WKS TO R/O C.ACNES Performed at [facility], [place] [address] [place], [place] [address]    Gram Stain NO WBC SEEN NO ORGANISMS SEEN   Final   Culture   Final    NO GROWTH 1 DAY Performed at [facility], [place] [address], [place], [place] [address]    Report Status PENDING  Incomplete  Aerobic/Anaerobic Culture w Gram Stain (surgical/deep wound)     Status: None (Preliminary result)   Collection Time: 03/17/21  3:12 PM   Specimen: Soft Tissue, Other  Result Value Ref Range Status   Specimen Description   Final    TISSUE RIGHT SHOULDER NO. 2 Performed at [facility] [address] [place], [place] [address]    Special Requests   Final    HOLD 3 WKS TO R/O C.ACNES Performed at [facility], [place] [address] [place], [place] [address]    Gram Stain   Final    FEW WBC PRESENT, PREDOMINANTLY PMN NO ORGANISMS SEEN    Culture   Final    NO GROWTH 1 DAY Performed at [facility] [address], [place], [place] [address]    Report Status PENDING  Incomplete         Radiology Studies: DG Foot Complete Left  Result Date: 03/19/2021 CLINICAL DATA:  History of LEFT foot fracture EXAM: LEFT FOOT - COMPLETE 3+ VIEW COMPARISON:   January 19, 2021 FINDINGS: Osteopenia. Mild irregularity of the lateral aspect of the base of the first proximal phalanx consistent with reported healing fracture. No additional acute fracture or dislocation noted. Contour deformities of the heads of the fourth and fifth metatarsals consistent with the sequela of prior fractures. Vascular calcifications. Pes planus. Midfoot degenerative changes. Soft tissue edema. IMPRESSION: Healing fractures of the base of the first proximal phalanx and the head of fourth and fifth metatarsals. Electronically Signed   By: Valentino Saxon MD   On: 03/19/2021 15:19   ECHOCARDIOGRAM COMPLETE  Result Date: 03/19/2021    ECHOCARDIOGRAM REPORT   Patient Name:   MIKYLAH ACKROYD Date of Exam: 03/19/2021 Medical Rec #:  [id number]         Height:       61.0 in Accession #:    [id number]        Weight:       253.0 lb Date of Birth:  Nov 24, 1945         BSA:          2.087 m Patient Age:    81 years          BP:           117/48 mmHg Patient Gender: F                 HR:           54 bpm. Exam Location:  Inpatient Procedure: 2D Echo, Cardiac Doppler and Color Doppler Indications:    I50.23 Acute on chronic systolic (congestive) heart failure  History:        Patient has prior history of Echocardiogram examinations, most                 recent 05/14/2015. Pacemaker, Pulmonary HTN, Arrythmias:Atrial                 Fibrillation, Signs/Symptoms:Syncope; Risk Factors:Hypertension                 and Dyslipidemia. Cancer. GERD. Hypothyroidism.  Sonographer:    Tiffany Dance Referring Phys: [id number] [place]  1. Left ventricular ejection fraction, by estimation, is 45 to 50%. The left ventricle has mildly decreased function. The left ventricle demonstrates global hypokinesis. The left ventricular internal cavity size was moderately dilated. Left ventricular diastolic parameters are indeterminate.  2. Right ventricular systolic function is mildly reduced. The right ventricular  size is mildly enlarged. There is mildly elevated pulmonary artery systolic pressure.  3. Left atrial size was severely dilated.  4. Right atrial size was severely dilated.  5. The mitral valve is normal in structure. Severe mitral valve regurgitation. No evidence of mitral stenosis.  6. The aortic valve is normal in structure. There is mild calcification of the aortic valve. Aortic valve regurgitation is trivial. No aortic stenosis is present.  7. The inferior vena cava is normal in size with greater than 50% respiratory variability, suggesting right atrial pressure of 3 mmHg. FINDINGS  Left Ventricle: Left ventricular ejection fraction, by estimation, is 45 to 50%. The left ventricle has mildly decreased function. The left ventricle demonstrates global hypokinesis. The left ventricular internal cavity size was moderately dilated. There is no left ventricular hypertrophy. Left ventricular diastolic function could not be evaluated due to atrial fibrillation. Left ventricular diastolic parameters are indeterminate. Right Ventricle: The right ventricular size is mildly enlarged. No increase in right ventricular wall thickness. Right ventricular systolic function is mildly reduced. There is mildly elevated pulmonary artery systolic pressure. The tricuspid regurgitant  velocity is 2.70 m/s, and with an assumed right atrial pressure of 15 mmHg, the estimated right ventricular systolic pressure is 82.5 mmHg. Left Atrium: Left atrial size was severely dilated. Right Atrium: Right atrial size was severely dilated. Pericardium: There is no evidence of pericardial effusion. Mitral Valve: The mitral valve is normal in structure. Mild mitral annular calcification. Severe mitral valve regurgitation, with centrally-directed jet. No evidence of mitral valve stenosis. Tricuspid Valve: The tricuspid valve is normal in structure. Tricuspid valve regurgitation is mild .  No evidence of tricuspid stenosis. Aortic Valve: The aortic valve  is normal in structure. There is mild calcification of the aortic valve. Aortic valve regurgitation is trivial. No aortic stenosis is present. Pulmonic Valve: The pulmonic valve was normal in structure. Pulmonic valve regurgitation is trivial. No evidence of pulmonic stenosis. Aorta: The aortic root is normal in size and structure. Venous: The inferior vena cava is normal in size with greater than 50% respiratory variability, suggesting right atrial pressure of 3 mmHg. IAS/Shunts: No atrial level shunt detected by color flow Doppler.  LEFT VENTRICLE PLAX 2D LVIDd:         6.10 cm LVIDs:         4.40 cm LV PW:         1.20 cm LV IVS:        1.10 cm LVOT diam:     2.00 cm LV SV:         74 LV SV Index:   35 LVOT Area:     3.14 cm  RIGHT VENTRICLE            IVC RV Basal diam:  3.40 cm    IVC diam: 3.30 cm RV Mid diam:    2.70 cm RV S prime:     9.79 cm/s TAPSE (M-mode): 2.0 cm LEFT ATRIUM              Index        RIGHT ATRIUM           Index LA diam:        5.80 cm  2.78 cm/m   RA Area:     24.70 cm LA Vol (A2C):   218.0 ml 104.45 ml/m RA Volume:   74.80 ml  35.84 ml/m LA Vol (A4C):   142.0 ml 68.03 ml/m LA Biplane Vol: 180.0 ml 86.24 ml/m  AORTIC VALVE LVOT Vmax:   120.50 cm/s LVOT Vmean:  79.250 cm/s LVOT VTI:    0.234 m  AORTA Ao Root diam: 3.10 cm Ao Asc diam:  3.75 cm MITRAL VALVE                TRICUSPID VALVE MV Area (PHT): 3.83 cm     TR Peak grad:   29.2 mmHg MV Decel Time: 198 msec     TR Vmax:        270.00 cm/s MV E velocity: 165.00 cm/s MV A velocity: 40.80 cm/s   SHUNTS MV E/A ratio:  4.04         Systemic VTI:  0.23 m                             Systemic Diam: 2.00 cm Glori Bickers MD Electronically signed by Glori Bickers MD Signature Date/Time: 03/19/2021/3:31:00 PM    Final         Scheduled Meds: . sodium chloride   Intravenous Once  . acetaminophen  650 mg Oral Q8H  . allopurinol  50 mg Oral Daily  . carvedilol  6.25 mg Oral BID WC  . docusate sodium  100 mg Oral BID  .  exemestane  25 mg Oral QPC breakfast  . ferrous sulfate  325 mg Oral Q breakfast  . levothyroxine  50 mcg Oral QAC breakfast  . magnesium oxide  400 mg Oral BID  . pantoprazole  40 mg Oral Daily  . potassium chloride  10 mEq Oral Daily  . pravastatin  20  mg Oral q1800  . torsemide  20 mg Oral Once per day on Tue Thu Sat  . Warfarin - Pharmacist Dosing Inpatient   Does not apply q1600   Continuous Infusions: . lactated ringers 50 mL/hr at 03/17/21 2259  . methocarbamol (ROBAXIN) IV       LOS: 2 days    Time spent: 35 minutes    Irine Seal, MD [facility]   To contact the attending provider between 7A-7P or the covering provider during after hours 7P-7A, please log into the web site [email or web address] and access using universal [place] password for that web site. If you do not have the password, please call the hospital operator.  03/20/2021, 12:34 PM

## 2021-03-20 NOTE — Progress Notes (Signed)
Physical Therapy Treatment Patient Details Name: Katelyn Jackson MRN: [id number] DOB: Oct 25, 1946 Today's Date: 03/20/2021    History of Present Illness Patient is a 75 year old female admitted for R shoulder hemiarthroplasty. PMH significant for breast CA, multiple falls, s/p pacemaker, morbid obesity    PT Comments    Pt is progressing well today. incr gait distance/activitytol. Continue to recommend HHPT and assist for mobility   Follow Up Recommendations  Home health PT;Supervision for mobility/OOB     Equipment Recommendations  Other (comment) (QC)    Recommendations for Other Services       Precautions / Restrictions Precautions Precautions: Fall;Shoulder Type of Shoulder Precautions: AROM elbow, wrist, hand ok. no ROM at shoulder Shoulder Interventions: Shoulder sling/immobilizer;Off for dressing/bathing/exercises Precaution Booklet Issued: Yes (comment) Precaution Comments: reports no falls since Feb, had 5-6 before pacemaker surgery in Feb. (multiple falls prior to PM) Required Braces or Orthoses: Other Brace Other Brace: post op shoe (on LLE) Restrictions Weight Bearing Restrictions: Yes RUE Weight Bearing: Non weight bearing Other Position/Activity Restrictions: WBAT on RLE in post op shoe    Mobility  Bed Mobility               General bed mobility comments: OOB in recliner upon arrival    Transfers Overall transfer level: Needs assistance Equipment used: Quad cane Transfers: Sit to/from Stand Sit to Stand: Min guard         General transfer comment: cues for hand placement  Ambulation/Gait Ambulation/Gait assistance: Min guard;Min assist Gait Distance (Feet): 25 Feet (10' more) Assistive device: Quad cane Gait Pattern/deviations: Step-to pattern;Wide base of support;Trendelenburg     General Gait Details: slow but overall steadygait with min/guard for safety; fatigues quickly requiring seated rest after 25'.   Stairs              Wheelchair Mobility    Modified Rankin (Stroke Patients Only)       Balance Overall balance assessment: Needs assistance Sitting-balance support: No upper extremity supported;Feet supported Sitting balance-Leahy Scale: Good     Standing balance support: Single extremity supported Standing balance-Leahy Scale: Poor Standing balance comment: reliant on UE support                            Cognition Arousal/Alertness: Awake/alert Behavior During Therapy: WFL for tasks assessed/performed Overall Cognitive Status: Within Functional Limits for tasks assessed                                        Exercises      General Comments        Pertinent Vitals/Pain Pain Assessment: 0-10 Pain Score: 2  Pain Location: R shoulder Pain Descriptors / Indicators: Sore Pain Intervention(s): Limited activity within patient's tolerance;Monitored during session;Premedicated before session    Home Living                      Prior Function            PT Goals (current goals can now be found in the care plan section) Acute Rehab PT Goals Patient Stated Goal: to go home today PT Goal Formulation: With patient Time For Goal Achievement: 03/26/21 Potential to Achieve Goals: Good Progress towards PT goals: Progressing toward goals    Frequency    Min 5X/week      PT Plan  Current plan remains appropriate    Co-evaluation              AM-PAC PT "6 Clicks" Mobility   Outcome Measure  Help needed turning from your back to your side while in a flat bed without using bedrails?: A Lot Help needed moving from lying on your back to sitting on the side of a flat bed without using bedrails?: A Lot Help needed moving to and from a bed to a chair (including a wheelchair)?: A Little Help needed standing up from a chair using your arms (e.g., wheelchair or bedside chair)?: A Little Help needed to walk in hospital room?: A Little Help needed  climbing 3-5 steps with a railing? : Total 6 Click Score: 14    End of Session Equipment Utilized During Treatment: Gait belt;Other (comment) (R shoulder sling) Activity Tolerance: Patient tolerated treatment well Patient left: in chair;with call bell/phone within reach;with chair alarm set   PT Visit Diagnosis: Other abnormalities of gait and mobility (R26.89)     Time: 9357-0177 PT Time Calculation (min) (ACUTE ONLY): 9 min  Charges:  $Gait Training: 8-22 mins                     Baxter Flattery, PT  Acute Rehab Dept ([place]) [phone] Pager [phone]  03/20/2021    [facility] 03/20/2021, 11:00 AM

## 2021-03-22 ENCOUNTER — Ambulatory Visit (INDEPENDENT_AMBULATORY_CARE_PROVIDER_SITE_OTHER): Payer: Medicare HMO | Admitting: General Practice

## 2021-03-22 ENCOUNTER — Encounter (HOSPITAL_COMMUNITY): Payer: Self-pay | Admitting: Orthopaedic Surgery

## 2021-03-22 ENCOUNTER — Telehealth: Payer: Self-pay | Admitting: Internal Medicine

## 2021-03-22 DIAGNOSIS — Z7901 Long term (current) use of anticoagulants: Secondary | ICD-10-CM | POA: Diagnosis not present

## 2021-03-22 DIAGNOSIS — I4821 Permanent atrial fibrillation: Secondary | ICD-10-CM | POA: Diagnosis not present

## 2021-03-22 DIAGNOSIS — I482 Chronic atrial fibrillation, unspecified: Secondary | ICD-10-CM

## 2021-03-22 LAB — SURGICAL PATHOLOGY

## 2021-03-22 LAB — POCT INR: INR: 1.5 — AB (ref 2.0–3.0)

## 2021-03-22 NOTE — Patient Instructions (Signed)
Pre visit review using our clinic review tool, if applicable. No additional management support is needed unless otherwise documented below in the visit note.  Please take 1 tablet today and tomorrow and then continue to take 1/2 tablet daily.  Re-check INR on Thursday.    Call Villa Herb, RN with results as well as reporting to [place]. Dosing instructions have been given to patient's son, Katelyn Jackson and he did verbalize understanding.

## 2021-03-22 NOTE — Telephone Encounter (Signed)
Jacelyn Grip nurse has called requesting a wheel chair order for the patient  FYI: [facility] is on back order  [place]: [phone]

## 2021-03-24 ENCOUNTER — Telehealth: Payer: Self-pay | Admitting: Internal Medicine

## 2021-03-24 DIAGNOSIS — S42101D Fracture of unspecified part of scapula, right shoulder, subsequent encounter for fracture with routine healing: Secondary | ICD-10-CM | POA: Diagnosis not present

## 2021-03-24 DIAGNOSIS — S42201D Unspecified fracture of upper end of right humerus, subsequent encounter for fracture with routine healing: Secondary | ICD-10-CM | POA: Diagnosis not present

## 2021-03-24 DIAGNOSIS — I5023 Acute on chronic systolic (congestive) heart failure: Secondary | ICD-10-CM | POA: Diagnosis not present

## 2021-03-24 DIAGNOSIS — L89892 Pressure ulcer of other site, stage 2: Secondary | ICD-10-CM | POA: Diagnosis not present

## 2021-03-24 DIAGNOSIS — N184 Chronic kidney disease, stage 4 (severe): Secondary | ICD-10-CM | POA: Diagnosis not present

## 2021-03-24 DIAGNOSIS — D631 Anemia in chronic kidney disease: Secondary | ICD-10-CM | POA: Diagnosis not present

## 2021-03-24 DIAGNOSIS — L89312 Pressure ulcer of right buttock, stage 2: Secondary | ICD-10-CM | POA: Diagnosis not present

## 2021-03-24 DIAGNOSIS — D62 Acute posthemorrhagic anemia: Secondary | ICD-10-CM | POA: Diagnosis not present

## 2021-03-24 DIAGNOSIS — I130 Hypertensive heart and chronic kidney disease with heart failure and stage 1 through stage 4 chronic kidney disease, or unspecified chronic kidney disease: Secondary | ICD-10-CM | POA: Diagnosis not present

## 2021-03-24 NOTE — Telephone Encounter (Signed)
    [place] Name: [place] Agency Name: Santina Evans Phone #: [phone] Service Requested: PT, SKILLED NURSING Frequency of Visits: PT [phone]

## 2021-03-25 DIAGNOSIS — N184 Chronic kidney disease, stage 4 (severe): Secondary | ICD-10-CM | POA: Diagnosis not present

## 2021-03-25 DIAGNOSIS — S42201D Unspecified fracture of upper end of right humerus, subsequent encounter for fracture with routine healing: Secondary | ICD-10-CM | POA: Diagnosis not present

## 2021-03-25 DIAGNOSIS — S42101D Fracture of unspecified part of scapula, right shoulder, subsequent encounter for fracture with routine healing: Secondary | ICD-10-CM | POA: Diagnosis not present

## 2021-03-25 DIAGNOSIS — I130 Hypertensive heart and chronic kidney disease with heart failure and stage 1 through stage 4 chronic kidney disease, or unspecified chronic kidney disease: Secondary | ICD-10-CM | POA: Diagnosis not present

## 2021-03-25 DIAGNOSIS — L89312 Pressure ulcer of right buttock, stage 2: Secondary | ICD-10-CM | POA: Diagnosis not present

## 2021-03-25 DIAGNOSIS — L89892 Pressure ulcer of other site, stage 2: Secondary | ICD-10-CM | POA: Diagnosis not present

## 2021-03-25 DIAGNOSIS — D62 Acute posthemorrhagic anemia: Secondary | ICD-10-CM | POA: Diagnosis not present

## 2021-03-25 DIAGNOSIS — D631 Anemia in chronic kidney disease: Secondary | ICD-10-CM | POA: Diagnosis not present

## 2021-03-25 DIAGNOSIS — I5023 Acute on chronic systolic (congestive) heart failure: Secondary | ICD-10-CM | POA: Diagnosis not present

## 2021-03-25 NOTE — Telephone Encounter (Signed)
Notified Arnold w/MD response../lmb 

## 2021-03-25 NOTE — Telephone Encounter (Signed)
Okay.  Thanks.

## 2021-03-26 ENCOUNTER — Telehealth: Payer: Self-pay | Admitting: Internal Medicine

## 2021-03-26 DIAGNOSIS — M19011 Primary osteoarthritis, right shoulder: Secondary | ICD-10-CM | POA: Diagnosis not present

## 2021-03-26 DIAGNOSIS — I130 Hypertensive heart and chronic kidney disease with heart failure and stage 1 through stage 4 chronic kidney disease, or unspecified chronic kidney disease: Secondary | ICD-10-CM | POA: Diagnosis not present

## 2021-03-26 DIAGNOSIS — L89892 Pressure ulcer of other site, stage 2: Secondary | ICD-10-CM | POA: Diagnosis not present

## 2021-03-26 DIAGNOSIS — S42101D Fracture of unspecified part of scapula, right shoulder, subsequent encounter for fracture with routine healing: Secondary | ICD-10-CM | POA: Diagnosis not present

## 2021-03-26 DIAGNOSIS — D62 Acute posthemorrhagic anemia: Secondary | ICD-10-CM | POA: Diagnosis not present

## 2021-03-26 DIAGNOSIS — N184 Chronic kidney disease, stage 4 (severe): Secondary | ICD-10-CM | POA: Diagnosis not present

## 2021-03-26 DIAGNOSIS — L89312 Pressure ulcer of right buttock, stage 2: Secondary | ICD-10-CM | POA: Diagnosis not present

## 2021-03-26 DIAGNOSIS — S42201D Unspecified fracture of upper end of right humerus, subsequent encounter for fracture with routine healing: Secondary | ICD-10-CM | POA: Diagnosis not present

## 2021-03-26 DIAGNOSIS — D631 Anemia in chronic kidney disease: Secondary | ICD-10-CM | POA: Diagnosis not present

## 2021-03-26 DIAGNOSIS — I5023 Acute on chronic systolic (congestive) heart failure: Secondary | ICD-10-CM | POA: Diagnosis not present

## 2021-03-26 NOTE — Telephone Encounter (Signed)
Okay.  Thanks.

## 2021-03-26 NOTE — Telephone Encounter (Signed)
[place] Name: [place] Agency Name: Santina [facility] Phone #: [phone] Service Requested: OT Frequency of Visits: 1W2, 0W1, 1W2  OT for ADL, transfers and exercise

## 2021-03-29 DIAGNOSIS — D631 Anemia in chronic kidney disease: Secondary | ICD-10-CM | POA: Diagnosis not present

## 2021-03-29 DIAGNOSIS — I5023 Acute on chronic systolic (congestive) heart failure: Secondary | ICD-10-CM | POA: Diagnosis not present

## 2021-03-29 DIAGNOSIS — I130 Hypertensive heart and chronic kidney disease with heart failure and stage 1 through stage 4 chronic kidney disease, or unspecified chronic kidney disease: Secondary | ICD-10-CM | POA: Diagnosis not present

## 2021-03-29 DIAGNOSIS — L89892 Pressure ulcer of other site, stage 2: Secondary | ICD-10-CM | POA: Diagnosis not present

## 2021-03-29 DIAGNOSIS — L89312 Pressure ulcer of right buttock, stage 2: Secondary | ICD-10-CM | POA: Diagnosis not present

## 2021-03-29 DIAGNOSIS — S42101D Fracture of unspecified part of scapula, right shoulder, subsequent encounter for fracture with routine healing: Secondary | ICD-10-CM | POA: Diagnosis not present

## 2021-03-29 DIAGNOSIS — N184 Chronic kidney disease, stage 4 (severe): Secondary | ICD-10-CM | POA: Diagnosis not present

## 2021-03-29 DIAGNOSIS — S42201D Unspecified fracture of upper end of right humerus, subsequent encounter for fracture with routine healing: Secondary | ICD-10-CM | POA: Diagnosis not present

## 2021-03-29 DIAGNOSIS — D62 Acute posthemorrhagic anemia: Secondary | ICD-10-CM | POA: Diagnosis not present

## 2021-03-29 LAB — POCT INR: INR: 3.2 — AB (ref 2.0–3.0)

## 2021-03-29 NOTE — Telephone Encounter (Signed)
Called Don there was no answer LMOM w/MD response.Marland KitchenJohny Jackson

## 2021-03-31 ENCOUNTER — Ambulatory Visit (INDEPENDENT_AMBULATORY_CARE_PROVIDER_SITE_OTHER): Payer: Medicare HMO

## 2021-03-31 ENCOUNTER — Telehealth: Payer: Self-pay

## 2021-03-31 DIAGNOSIS — S42101D Fracture of unspecified part of scapula, right shoulder, subsequent encounter for fracture with routine healing: Secondary | ICD-10-CM | POA: Diagnosis not present

## 2021-03-31 DIAGNOSIS — I482 Chronic atrial fibrillation, unspecified: Secondary | ICD-10-CM | POA: Diagnosis not present

## 2021-03-31 DIAGNOSIS — I130 Hypertensive heart and chronic kidney disease with heart failure and stage 1 through stage 4 chronic kidney disease, or unspecified chronic kidney disease: Secondary | ICD-10-CM | POA: Diagnosis not present

## 2021-03-31 DIAGNOSIS — N184 Chronic kidney disease, stage 4 (severe): Secondary | ICD-10-CM | POA: Diagnosis not present

## 2021-03-31 DIAGNOSIS — Z7901 Long term (current) use of anticoagulants: Secondary | ICD-10-CM

## 2021-03-31 DIAGNOSIS — S42201D Unspecified fracture of upper end of right humerus, subsequent encounter for fracture with routine healing: Secondary | ICD-10-CM | POA: Diagnosis not present

## 2021-03-31 DIAGNOSIS — D631 Anemia in chronic kidney disease: Secondary | ICD-10-CM | POA: Diagnosis not present

## 2021-03-31 DIAGNOSIS — I5023 Acute on chronic systolic (congestive) heart failure: Secondary | ICD-10-CM | POA: Diagnosis not present

## 2021-03-31 DIAGNOSIS — D62 Acute posthemorrhagic anemia: Secondary | ICD-10-CM | POA: Diagnosis not present

## 2021-03-31 DIAGNOSIS — L89312 Pressure ulcer of right buttock, stage 2: Secondary | ICD-10-CM | POA: Diagnosis not present

## 2021-03-31 DIAGNOSIS — L89892 Pressure ulcer of other site, stage 2: Secondary | ICD-10-CM | POA: Diagnosis not present

## 2021-03-31 NOTE — Patient Instructions (Signed)
Please take 1/2 tablet today and then continue to take 1/2 tablet daily.  Re-check INR on next Weds.  Also, confirmed this by in house pharmacist  Dosing instructions have been given to patient's son, Katelyn Jackson and he did verbalize understanding.

## 2021-03-31 NOTE — Telephone Encounter (Signed)
INR noted on 5/23 as 3.2.   Called son [name] today, who states pt has not taken extra doses, denies bleeding, or diet changes.  Instructed [name] to have pt hold the 1/2 tablet today & resume 1/2 tablet tomorrow as prescribed.  Son states that pt has been taking 1 tablet, but is not sure; states he will call back after speaking with pt.  Instructed [name] to ensure pt gets INR checked in 2 weeks with [facility] or making in person appt with coumadin RN.  Son verb understanding; will call back after speaking to pt.

## 2021-03-31 NOTE — Telephone Encounter (Signed)
[name] states pt has been taking 1 tablet M,W,F & 1/2 tablet T, Th; nothing on Sat, Sun.  Pt instructed to take 1/2 today & every day daily & to recheck x 1 week. [name] verb understanding.

## 2021-04-02 DIAGNOSIS — I5023 Acute on chronic systolic (congestive) heart failure: Secondary | ICD-10-CM | POA: Diagnosis not present

## 2021-04-02 DIAGNOSIS — L89892 Pressure ulcer of other site, stage 2: Secondary | ICD-10-CM | POA: Diagnosis not present

## 2021-04-02 DIAGNOSIS — L89312 Pressure ulcer of right buttock, stage 2: Secondary | ICD-10-CM | POA: Diagnosis not present

## 2021-04-02 DIAGNOSIS — S42101D Fracture of unspecified part of scapula, right shoulder, subsequent encounter for fracture with routine healing: Secondary | ICD-10-CM | POA: Diagnosis not present

## 2021-04-02 DIAGNOSIS — S42201D Unspecified fracture of upper end of right humerus, subsequent encounter for fracture with routine healing: Secondary | ICD-10-CM | POA: Diagnosis not present

## 2021-04-02 DIAGNOSIS — N184 Chronic kidney disease, stage 4 (severe): Secondary | ICD-10-CM | POA: Diagnosis not present

## 2021-04-02 DIAGNOSIS — D631 Anemia in chronic kidney disease: Secondary | ICD-10-CM | POA: Diagnosis not present

## 2021-04-02 DIAGNOSIS — I130 Hypertensive heart and chronic kidney disease with heart failure and stage 1 through stage 4 chronic kidney disease, or unspecified chronic kidney disease: Secondary | ICD-10-CM | POA: Diagnosis not present

## 2021-04-02 DIAGNOSIS — D62 Acute posthemorrhagic anemia: Secondary | ICD-10-CM | POA: Diagnosis not present

## 2021-04-07 DIAGNOSIS — L89312 Pressure ulcer of right buttock, stage 2: Secondary | ICD-10-CM | POA: Diagnosis not present

## 2021-04-07 DIAGNOSIS — S42101D Fracture of unspecified part of scapula, right shoulder, subsequent encounter for fracture with routine healing: Secondary | ICD-10-CM | POA: Diagnosis not present

## 2021-04-07 DIAGNOSIS — I5023 Acute on chronic systolic (congestive) heart failure: Secondary | ICD-10-CM | POA: Diagnosis not present

## 2021-04-07 DIAGNOSIS — S42201D Unspecified fracture of upper end of right humerus, subsequent encounter for fracture with routine healing: Secondary | ICD-10-CM | POA: Diagnosis not present

## 2021-04-07 DIAGNOSIS — I130 Hypertensive heart and chronic kidney disease with heart failure and stage 1 through stage 4 chronic kidney disease, or unspecified chronic kidney disease: Secondary | ICD-10-CM | POA: Diagnosis not present

## 2021-04-07 DIAGNOSIS — N184 Chronic kidney disease, stage 4 (severe): Secondary | ICD-10-CM | POA: Diagnosis not present

## 2021-04-07 DIAGNOSIS — D631 Anemia in chronic kidney disease: Secondary | ICD-10-CM | POA: Diagnosis not present

## 2021-04-07 DIAGNOSIS — D62 Acute posthemorrhagic anemia: Secondary | ICD-10-CM | POA: Diagnosis not present

## 2021-04-07 DIAGNOSIS — L89892 Pressure ulcer of other site, stage 2: Secondary | ICD-10-CM | POA: Diagnosis not present

## 2021-04-08 DIAGNOSIS — S42101D Fracture of unspecified part of scapula, right shoulder, subsequent encounter for fracture with routine healing: Secondary | ICD-10-CM | POA: Diagnosis not present

## 2021-04-08 DIAGNOSIS — L89892 Pressure ulcer of other site, stage 2: Secondary | ICD-10-CM | POA: Diagnosis not present

## 2021-04-08 DIAGNOSIS — I130 Hypertensive heart and chronic kidney disease with heart failure and stage 1 through stage 4 chronic kidney disease, or unspecified chronic kidney disease: Secondary | ICD-10-CM | POA: Diagnosis not present

## 2021-04-08 DIAGNOSIS — D62 Acute posthemorrhagic anemia: Secondary | ICD-10-CM | POA: Diagnosis not present

## 2021-04-08 DIAGNOSIS — D631 Anemia in chronic kidney disease: Secondary | ICD-10-CM | POA: Diagnosis not present

## 2021-04-08 DIAGNOSIS — N184 Chronic kidney disease, stage 4 (severe): Secondary | ICD-10-CM | POA: Diagnosis not present

## 2021-04-08 DIAGNOSIS — I5023 Acute on chronic systolic (congestive) heart failure: Secondary | ICD-10-CM | POA: Diagnosis not present

## 2021-04-08 DIAGNOSIS — S42201D Unspecified fracture of upper end of right humerus, subsequent encounter for fracture with routine healing: Secondary | ICD-10-CM | POA: Diagnosis not present

## 2021-04-08 DIAGNOSIS — L89312 Pressure ulcer of right buttock, stage 2: Secondary | ICD-10-CM | POA: Diagnosis not present

## 2021-04-08 LAB — AEROBIC/ANAEROBIC CULTURE W GRAM STAIN (SURGICAL/DEEP WOUND)
Culture: NO GROWTH
Culture: NO GROWTH
Gram Stain: NONE SEEN

## 2021-04-09 DIAGNOSIS — N184 Chronic kidney disease, stage 4 (severe): Secondary | ICD-10-CM | POA: Diagnosis not present

## 2021-04-13 ENCOUNTER — Ambulatory Visit (INDEPENDENT_AMBULATORY_CARE_PROVIDER_SITE_OTHER): Payer: Medicare HMO | Admitting: General Practice

## 2021-04-13 DIAGNOSIS — L89312 Pressure ulcer of right buttock, stage 2: Secondary | ICD-10-CM | POA: Diagnosis not present

## 2021-04-13 DIAGNOSIS — S42101D Fracture of unspecified part of scapula, right shoulder, subsequent encounter for fracture with routine healing: Secondary | ICD-10-CM | POA: Diagnosis not present

## 2021-04-13 DIAGNOSIS — S42201D Unspecified fracture of upper end of right humerus, subsequent encounter for fracture with routine healing: Secondary | ICD-10-CM | POA: Diagnosis not present

## 2021-04-13 DIAGNOSIS — I5023 Acute on chronic systolic (congestive) heart failure: Secondary | ICD-10-CM | POA: Diagnosis not present

## 2021-04-13 DIAGNOSIS — L89892 Pressure ulcer of other site, stage 2: Secondary | ICD-10-CM | POA: Diagnosis not present

## 2021-04-13 DIAGNOSIS — I130 Hypertensive heart and chronic kidney disease with heart failure and stage 1 through stage 4 chronic kidney disease, or unspecified chronic kidney disease: Secondary | ICD-10-CM | POA: Diagnosis not present

## 2021-04-13 DIAGNOSIS — D62 Acute posthemorrhagic anemia: Secondary | ICD-10-CM | POA: Diagnosis not present

## 2021-04-13 DIAGNOSIS — I482 Chronic atrial fibrillation, unspecified: Secondary | ICD-10-CM | POA: Diagnosis not present

## 2021-04-13 DIAGNOSIS — Z7901 Long term (current) use of anticoagulants: Secondary | ICD-10-CM

## 2021-04-13 DIAGNOSIS — N184 Chronic kidney disease, stage 4 (severe): Secondary | ICD-10-CM | POA: Diagnosis not present

## 2021-04-13 DIAGNOSIS — D631 Anemia in chronic kidney disease: Secondary | ICD-10-CM | POA: Diagnosis not present

## 2021-04-13 LAB — POCT INR: INR: 2.2 (ref 2.0–3.0)

## 2021-04-13 NOTE — Patient Instructions (Signed)
Pre visit review using our clinic review tool, if applicable. No additional management support is needed unless otherwise documented below in the visit note.  Please continue to take 1/2 tablet daily.  Re-check INR in 2 weeks.    Dosing instructions have been given to patient's son, Katelyn Jackson and he did verbalize understanding.

## 2021-04-13 NOTE — Progress Notes (Signed)
Medical screening examination/treatment/procedure(s) were performed by non-physician practitioner and as supervising physician I was immediately available for consultation/collaboration. I agree with above. Kashaun Bebo, MD   

## 2021-04-14 DIAGNOSIS — I130 Hypertensive heart and chronic kidney disease with heart failure and stage 1 through stage 4 chronic kidney disease, or unspecified chronic kidney disease: Secondary | ICD-10-CM | POA: Diagnosis not present

## 2021-04-14 DIAGNOSIS — S42101D Fracture of unspecified part of scapula, right shoulder, subsequent encounter for fracture with routine healing: Secondary | ICD-10-CM | POA: Diagnosis not present

## 2021-04-14 DIAGNOSIS — L89892 Pressure ulcer of other site, stage 2: Secondary | ICD-10-CM | POA: Diagnosis not present

## 2021-04-14 DIAGNOSIS — L89312 Pressure ulcer of right buttock, stage 2: Secondary | ICD-10-CM | POA: Diagnosis not present

## 2021-04-14 DIAGNOSIS — D631 Anemia in chronic kidney disease: Secondary | ICD-10-CM | POA: Diagnosis not present

## 2021-04-14 DIAGNOSIS — S42201D Unspecified fracture of upper end of right humerus, subsequent encounter for fracture with routine healing: Secondary | ICD-10-CM | POA: Diagnosis not present

## 2021-04-14 DIAGNOSIS — N184 Chronic kidney disease, stage 4 (severe): Secondary | ICD-10-CM | POA: Diagnosis not present

## 2021-04-14 DIAGNOSIS — D62 Acute posthemorrhagic anemia: Secondary | ICD-10-CM | POA: Diagnosis not present

## 2021-04-14 DIAGNOSIS — I5023 Acute on chronic systolic (congestive) heart failure: Secondary | ICD-10-CM | POA: Diagnosis not present

## 2021-04-15 DIAGNOSIS — L89312 Pressure ulcer of right buttock, stage 2: Secondary | ICD-10-CM | POA: Diagnosis not present

## 2021-04-15 DIAGNOSIS — L89892 Pressure ulcer of other site, stage 2: Secondary | ICD-10-CM | POA: Diagnosis not present

## 2021-04-15 DIAGNOSIS — I130 Hypertensive heart and chronic kidney disease with heart failure and stage 1 through stage 4 chronic kidney disease, or unspecified chronic kidney disease: Secondary | ICD-10-CM | POA: Diagnosis not present

## 2021-04-15 DIAGNOSIS — S42201D Unspecified fracture of upper end of right humerus, subsequent encounter for fracture with routine healing: Secondary | ICD-10-CM | POA: Diagnosis not present

## 2021-04-15 DIAGNOSIS — N184 Chronic kidney disease, stage 4 (severe): Secondary | ICD-10-CM | POA: Diagnosis not present

## 2021-04-15 DIAGNOSIS — S42101D Fracture of unspecified part of scapula, right shoulder, subsequent encounter for fracture with routine healing: Secondary | ICD-10-CM | POA: Diagnosis not present

## 2021-04-15 DIAGNOSIS — D62 Acute posthemorrhagic anemia: Secondary | ICD-10-CM | POA: Diagnosis not present

## 2021-04-15 DIAGNOSIS — D631 Anemia in chronic kidney disease: Secondary | ICD-10-CM | POA: Diagnosis not present

## 2021-04-15 DIAGNOSIS — I5023 Acute on chronic systolic (congestive) heart failure: Secondary | ICD-10-CM | POA: Diagnosis not present

## 2021-04-16 DIAGNOSIS — M19011 Primary osteoarthritis, right shoulder: Secondary | ICD-10-CM | POA: Diagnosis not present

## 2021-04-16 DIAGNOSIS — R600 Localized edema: Secondary | ICD-10-CM | POA: Diagnosis not present

## 2021-04-16 DIAGNOSIS — R531 Weakness: Secondary | ICD-10-CM | POA: Diagnosis not present

## 2021-04-16 DIAGNOSIS — I129 Hypertensive chronic kidney disease with stage 1 through stage 4 chronic kidney disease, or unspecified chronic kidney disease: Secondary | ICD-10-CM | POA: Diagnosis not present

## 2021-04-16 DIAGNOSIS — L0390 Cellulitis, unspecified: Secondary | ICD-10-CM | POA: Diagnosis not present

## 2021-04-16 DIAGNOSIS — Z950 Presence of cardiac pacemaker: Secondary | ICD-10-CM | POA: Diagnosis not present

## 2021-04-16 DIAGNOSIS — Z6841 Body Mass Index (BMI) 40.0 and over, adult: Secondary | ICD-10-CM | POA: Diagnosis not present

## 2021-04-16 DIAGNOSIS — D649 Anemia, unspecified: Secondary | ICD-10-CM | POA: Diagnosis not present

## 2021-04-16 DIAGNOSIS — N184 Chronic kidney disease, stage 4 (severe): Secondary | ICD-10-CM | POA: Diagnosis not present

## 2021-04-16 DIAGNOSIS — M109 Gout, unspecified: Secondary | ICD-10-CM | POA: Diagnosis not present

## 2021-04-20 ENCOUNTER — Other Ambulatory Visit (HOSPITAL_COMMUNITY): Payer: Self-pay | Admitting: *Deleted

## 2021-04-20 DIAGNOSIS — L89892 Pressure ulcer of other site, stage 2: Secondary | ICD-10-CM | POA: Diagnosis not present

## 2021-04-20 DIAGNOSIS — D62 Acute posthemorrhagic anemia: Secondary | ICD-10-CM | POA: Diagnosis not present

## 2021-04-20 DIAGNOSIS — I130 Hypertensive heart and chronic kidney disease with heart failure and stage 1 through stage 4 chronic kidney disease, or unspecified chronic kidney disease: Secondary | ICD-10-CM | POA: Diagnosis not present

## 2021-04-20 DIAGNOSIS — S42201D Unspecified fracture of upper end of right humerus, subsequent encounter for fracture with routine healing: Secondary | ICD-10-CM | POA: Diagnosis not present

## 2021-04-20 DIAGNOSIS — I5023 Acute on chronic systolic (congestive) heart failure: Secondary | ICD-10-CM | POA: Diagnosis not present

## 2021-04-20 DIAGNOSIS — L89312 Pressure ulcer of right buttock, stage 2: Secondary | ICD-10-CM | POA: Diagnosis not present

## 2021-04-20 DIAGNOSIS — N184 Chronic kidney disease, stage 4 (severe): Secondary | ICD-10-CM | POA: Diagnosis not present

## 2021-04-20 DIAGNOSIS — D631 Anemia in chronic kidney disease: Secondary | ICD-10-CM | POA: Diagnosis not present

## 2021-04-20 DIAGNOSIS — S42101D Fracture of unspecified part of scapula, right shoulder, subsequent encounter for fracture with routine healing: Secondary | ICD-10-CM | POA: Diagnosis not present

## 2021-04-22 ENCOUNTER — Encounter (HOSPITAL_COMMUNITY): Payer: Medicare HMO

## 2021-04-22 ENCOUNTER — Telehealth: Payer: Self-pay | Admitting: Internal Medicine

## 2021-04-22 DIAGNOSIS — N184 Chronic kidney disease, stage 4 (severe): Secondary | ICD-10-CM | POA: Diagnosis not present

## 2021-04-22 DIAGNOSIS — D62 Acute posthemorrhagic anemia: Secondary | ICD-10-CM | POA: Diagnosis not present

## 2021-04-22 DIAGNOSIS — S42101D Fracture of unspecified part of scapula, right shoulder, subsequent encounter for fracture with routine healing: Secondary | ICD-10-CM | POA: Diagnosis not present

## 2021-04-22 DIAGNOSIS — L89312 Pressure ulcer of right buttock, stage 2: Secondary | ICD-10-CM | POA: Diagnosis not present

## 2021-04-22 DIAGNOSIS — I5023 Acute on chronic systolic (congestive) heart failure: Secondary | ICD-10-CM | POA: Diagnosis not present

## 2021-04-22 DIAGNOSIS — D631 Anemia in chronic kidney disease: Secondary | ICD-10-CM | POA: Diagnosis not present

## 2021-04-22 DIAGNOSIS — S42201D Unspecified fracture of upper end of right humerus, subsequent encounter for fracture with routine healing: Secondary | ICD-10-CM | POA: Diagnosis not present

## 2021-04-22 DIAGNOSIS — I130 Hypertensive heart and chronic kidney disease with heart failure and stage 1 through stage 4 chronic kidney disease, or unspecified chronic kidney disease: Secondary | ICD-10-CM | POA: Diagnosis not present

## 2021-04-22 DIAGNOSIS — L89892 Pressure ulcer of other site, stage 2: Secondary | ICD-10-CM | POA: Diagnosis not present

## 2021-04-22 NOTE — Telephone Encounter (Signed)
Heather w/ [facility] is requesting 3 more weeks of nursing for wounds on right buttock and right posterior thigh. She also mentioned that the patient has dry blisters on her left leg. She said that the patient told her she has been having some weeping. She said she wanted to let Dr. [name] know and see if he wanted to call in an antibiotic. Please advise    Okay LVM: [phone]

## 2021-04-22 NOTE — Telephone Encounter (Signed)
[place] for verbals, but probably should hold on further antibx for now

## 2021-04-22 NOTE — Telephone Encounter (Signed)
MD is out of the office util Monday. Pls advise.Marland KitchenJohny Chess

## 2021-04-23 ENCOUNTER — Ambulatory Visit: Payer: Medicare HMO | Admitting: Podiatry

## 2021-04-23 NOTE — Telephone Encounter (Signed)
Notified Heather w/MD response../lmb 

## 2021-04-27 ENCOUNTER — Telehealth: Payer: Self-pay | Admitting: Internal Medicine

## 2021-04-27 DIAGNOSIS — N184 Chronic kidney disease, stage 4 (severe): Secondary | ICD-10-CM | POA: Diagnosis not present

## 2021-04-27 DIAGNOSIS — L89312 Pressure ulcer of right buttock, stage 2: Secondary | ICD-10-CM | POA: Diagnosis not present

## 2021-04-27 DIAGNOSIS — S42101D Fracture of unspecified part of scapula, right shoulder, subsequent encounter for fracture with routine healing: Secondary | ICD-10-CM | POA: Diagnosis not present

## 2021-04-27 DIAGNOSIS — D631 Anemia in chronic kidney disease: Secondary | ICD-10-CM | POA: Diagnosis not present

## 2021-04-27 DIAGNOSIS — I130 Hypertensive heart and chronic kidney disease with heart failure and stage 1 through stage 4 chronic kidney disease, or unspecified chronic kidney disease: Secondary | ICD-10-CM | POA: Diagnosis not present

## 2021-04-27 DIAGNOSIS — D62 Acute posthemorrhagic anemia: Secondary | ICD-10-CM | POA: Diagnosis not present

## 2021-04-27 DIAGNOSIS — S42201D Unspecified fracture of upper end of right humerus, subsequent encounter for fracture with routine healing: Secondary | ICD-10-CM | POA: Diagnosis not present

## 2021-04-27 DIAGNOSIS — I5023 Acute on chronic systolic (congestive) heart failure: Secondary | ICD-10-CM | POA: Diagnosis not present

## 2021-04-27 DIAGNOSIS — L89892 Pressure ulcer of other site, stage 2: Secondary | ICD-10-CM | POA: Diagnosis not present

## 2021-04-27 NOTE — Telephone Encounter (Signed)
    [place] Name: [place] Name: Santina Evans Phone #: [phone] Service Requested: OT Frequency of Visits: extend  OT 1w4

## 2021-04-29 ENCOUNTER — Ambulatory Visit (HOSPITAL_COMMUNITY)
Admission: RE | Admit: 2021-04-29 | Discharge: 2021-04-29 | Disposition: A | Discharge status: Home / Self Care | Payer: Medicare HMO | Source: Ambulatory Visit | Attending: Internal Medicine | Admitting: Internal Medicine

## 2021-04-29 ENCOUNTER — Encounter (HOSPITAL_COMMUNITY): Payer: Medicare HMO

## 2021-04-29 DIAGNOSIS — N189 Chronic kidney disease, unspecified: Secondary | ICD-10-CM | POA: Diagnosis not present

## 2021-04-29 DIAGNOSIS — D631 Anemia in chronic kidney disease: Secondary | ICD-10-CM | POA: Insufficient documentation

## 2021-04-29 MED ORDER — SODIUM CHLORIDE 0.9 % IV SOLN
510.0000 mg | INTRAVENOUS | Status: DC
  Administered 2021-04-29: 510 mg via INTRAVENOUS
  Filled 2021-04-29: qty 510

## 2021-04-29 NOTE — Telephone Encounter (Signed)
Called Katelyn Jackson there was no answer LMOM w/MD response.Marland KitchenJohny Jackson

## 2021-04-29 NOTE — Telephone Encounter (Signed)
OK. Thx

## 2021-04-30 DIAGNOSIS — L89312 Pressure ulcer of right buttock, stage 2: Secondary | ICD-10-CM | POA: Diagnosis not present

## 2021-04-30 DIAGNOSIS — I10 Essential (primary) hypertension: Secondary | ICD-10-CM | POA: Diagnosis not present

## 2021-04-30 DIAGNOSIS — D631 Anemia in chronic kidney disease: Secondary | ICD-10-CM | POA: Diagnosis not present

## 2021-04-30 DIAGNOSIS — S42201D Unspecified fracture of upper end of right humerus, subsequent encounter for fracture with routine healing: Secondary | ICD-10-CM | POA: Diagnosis not present

## 2021-04-30 DIAGNOSIS — I5023 Acute on chronic systolic (congestive) heart failure: Secondary | ICD-10-CM | POA: Diagnosis not present

## 2021-04-30 DIAGNOSIS — D62 Acute posthemorrhagic anemia: Secondary | ICD-10-CM | POA: Diagnosis not present

## 2021-04-30 DIAGNOSIS — Z45018 Encounter for adjustment and management of other part of cardiac pacemaker: Secondary | ICD-10-CM | POA: Diagnosis not present

## 2021-04-30 DIAGNOSIS — S42101D Fracture of unspecified part of scapula, right shoulder, subsequent encounter for fracture with routine healing: Secondary | ICD-10-CM | POA: Diagnosis not present

## 2021-04-30 DIAGNOSIS — I130 Hypertensive heart and chronic kidney disease with heart failure and stage 1 through stage 4 chronic kidney disease, or unspecified chronic kidney disease: Secondary | ICD-10-CM | POA: Diagnosis not present

## 2021-04-30 DIAGNOSIS — L89892 Pressure ulcer of other site, stage 2: Secondary | ICD-10-CM | POA: Diagnosis not present

## 2021-04-30 DIAGNOSIS — N184 Chronic kidney disease, stage 4 (severe): Secondary | ICD-10-CM | POA: Diagnosis not present

## 2021-04-30 DIAGNOSIS — I4821 Permanent atrial fibrillation: Secondary | ICD-10-CM | POA: Diagnosis not present

## 2021-05-01 ENCOUNTER — Ambulatory Visit (INDEPENDENT_AMBULATORY_CARE_PROVIDER_SITE_OTHER): Payer: Medicare HMO | Admitting: Podiatry

## 2021-05-01 ENCOUNTER — Ambulatory Visit (INDEPENDENT_AMBULATORY_CARE_PROVIDER_SITE_OTHER): Payer: Medicare HMO

## 2021-05-01 ENCOUNTER — Other Ambulatory Visit: Payer: Self-pay

## 2021-05-01 DIAGNOSIS — B351 Tinea unguium: Secondary | ICD-10-CM | POA: Diagnosis not present

## 2021-05-01 DIAGNOSIS — S92412D Displaced fracture of proximal phalanx of left great toe, subsequent encounter for fracture with routine healing: Secondary | ICD-10-CM

## 2021-05-01 DIAGNOSIS — D689 Coagulation defect, unspecified: Secondary | ICD-10-CM

## 2021-05-01 NOTE — Progress Notes (Signed)
  Subjective:  Patient ID: Katelyn Jackson, female    DOB: Jan 07, 1946,  MRN: [id number]  Chief Complaint  Patient presents with   Nail Problem     foot-right pain and nail trim   75 y.o. female presents with the above complaint. History confirmed with patient. Thinks the foot is doing better.  DOI 01/09/21  Objective:  Physical Exam: warm, good capillary refill, no trophic changes or ulcerative lesions, normal DP and PT pulses and normal sensory exam. Lymphedema BLE Left Foot: POP left 1st toe, 5th metatarsal head. Bruising over the 1st toe.  Assessment:   1. Closed displaced fracture of proximal phalanx of left great toe with routine healing, subsequent encounter   2. Onychomycosis   3. Coagulation defect [facility])     Plan:  Patient was evaluated and treated and all questions answered.  Fracture left 1st toe, 5th metatarsal -Repeat XR taken, appears healed.  Onychomycosis with pain -nails debrided x10  Procedure: Nail Debridement Type of Debridement: manual, sharp debridement. Instrumentation: Nail nipper, rotary burr. Number of Nails: 10  Lymphedema -Discussed importance of using pumps to reduce edema.    No follow-ups on file.

## 2021-05-04 ENCOUNTER — Ambulatory Visit (INDEPENDENT_AMBULATORY_CARE_PROVIDER_SITE_OTHER): Payer: Medicare HMO | Admitting: General Practice

## 2021-05-04 DIAGNOSIS — Z7901 Long term (current) use of anticoagulants: Secondary | ICD-10-CM

## 2021-05-04 DIAGNOSIS — M109 Gout, unspecified: Secondary | ICD-10-CM | POA: Diagnosis not present

## 2021-05-04 DIAGNOSIS — D62 Acute posthemorrhagic anemia: Secondary | ICD-10-CM | POA: Diagnosis not present

## 2021-05-04 DIAGNOSIS — L0390 Cellulitis, unspecified: Secondary | ICD-10-CM | POA: Diagnosis not present

## 2021-05-04 DIAGNOSIS — N184 Chronic kidney disease, stage 4 (severe): Secondary | ICD-10-CM | POA: Diagnosis not present

## 2021-05-04 DIAGNOSIS — S42201D Unspecified fracture of upper end of right humerus, subsequent encounter for fracture with routine healing: Secondary | ICD-10-CM | POA: Diagnosis not present

## 2021-05-04 DIAGNOSIS — L89312 Pressure ulcer of right buttock, stage 2: Secondary | ICD-10-CM | POA: Diagnosis not present

## 2021-05-04 DIAGNOSIS — D649 Anemia, unspecified: Secondary | ICD-10-CM | POA: Diagnosis not present

## 2021-05-04 DIAGNOSIS — I5023 Acute on chronic systolic (congestive) heart failure: Secondary | ICD-10-CM | POA: Diagnosis not present

## 2021-05-04 DIAGNOSIS — D631 Anemia in chronic kidney disease: Secondary | ICD-10-CM | POA: Diagnosis not present

## 2021-05-04 DIAGNOSIS — Z6841 Body Mass Index (BMI) 40.0 and over, adult: Secondary | ICD-10-CM | POA: Diagnosis not present

## 2021-05-04 DIAGNOSIS — S42101D Fracture of unspecified part of scapula, right shoulder, subsequent encounter for fracture with routine healing: Secondary | ICD-10-CM | POA: Diagnosis not present

## 2021-05-04 DIAGNOSIS — I130 Hypertensive heart and chronic kidney disease with heart failure and stage 1 through stage 4 chronic kidney disease, or unspecified chronic kidney disease: Secondary | ICD-10-CM | POA: Diagnosis not present

## 2021-05-04 DIAGNOSIS — R600 Localized edema: Secondary | ICD-10-CM | POA: Diagnosis not present

## 2021-05-04 DIAGNOSIS — I482 Chronic atrial fibrillation, unspecified: Secondary | ICD-10-CM | POA: Diagnosis not present

## 2021-05-04 DIAGNOSIS — I129 Hypertensive chronic kidney disease with stage 1 through stage 4 chronic kidney disease, or unspecified chronic kidney disease: Secondary | ICD-10-CM | POA: Diagnosis not present

## 2021-05-04 DIAGNOSIS — L89892 Pressure ulcer of other site, stage 2: Secondary | ICD-10-CM | POA: Diagnosis not present

## 2021-05-04 LAB — POCT INR: INR: 1.9 — AB (ref 2.0–3.0)

## 2021-05-04 NOTE — Patient Instructions (Signed)
Pre visit review using our clinic review tool, if applicable. No additional management support is needed unless otherwise documented below in the visit note.  Change dosage and take 1/2 tablet daily except 1 tablet on Tuesdays.   Re-check INR in 2 weeks.    Dosing instructions have been given to patient's son, [name] and he did verbalize understanding.

## 2021-05-04 NOTE — Progress Notes (Signed)
Medical screening examination/treatment/procedure(s) were performed by non-physician practitioner and as supervising physician I was immediately available for consultation/collaboration. I agree with above. Karver Fadden, MD   

## 2021-05-05 DIAGNOSIS — I130 Hypertensive heart and chronic kidney disease with heart failure and stage 1 through stage 4 chronic kidney disease, or unspecified chronic kidney disease: Secondary | ICD-10-CM | POA: Diagnosis not present

## 2021-05-05 DIAGNOSIS — N184 Chronic kidney disease, stage 4 (severe): Secondary | ICD-10-CM | POA: Diagnosis not present

## 2021-05-05 DIAGNOSIS — S42101D Fracture of unspecified part of scapula, right shoulder, subsequent encounter for fracture with routine healing: Secondary | ICD-10-CM | POA: Diagnosis not present

## 2021-05-05 DIAGNOSIS — I5023 Acute on chronic systolic (congestive) heart failure: Secondary | ICD-10-CM | POA: Diagnosis not present

## 2021-05-05 DIAGNOSIS — D631 Anemia in chronic kidney disease: Secondary | ICD-10-CM | POA: Diagnosis not present

## 2021-05-05 DIAGNOSIS — L89892 Pressure ulcer of other site, stage 2: Secondary | ICD-10-CM | POA: Diagnosis not present

## 2021-05-05 DIAGNOSIS — L89312 Pressure ulcer of right buttock, stage 2: Secondary | ICD-10-CM | POA: Diagnosis not present

## 2021-05-05 DIAGNOSIS — S42201D Unspecified fracture of upper end of right humerus, subsequent encounter for fracture with routine healing: Secondary | ICD-10-CM | POA: Diagnosis not present

## 2021-05-05 DIAGNOSIS — D62 Acute posthemorrhagic anemia: Secondary | ICD-10-CM | POA: Diagnosis not present

## 2021-05-06 ENCOUNTER — Encounter (HOSPITAL_COMMUNITY): Payer: Medicare HMO

## 2021-05-06 ENCOUNTER — Encounter (HOSPITAL_COMMUNITY)
Admission: RE | Admit: 2021-05-06 | Discharge: 2021-05-06 | Disposition: A | Discharge status: Home / Self Care | Payer: Medicare HMO | Source: Ambulatory Visit | Attending: Internal Medicine | Admitting: Internal Medicine

## 2021-05-06 DIAGNOSIS — N184 Chronic kidney disease, stage 4 (severe): Secondary | ICD-10-CM | POA: Diagnosis not present

## 2021-05-06 MED ORDER — SODIUM CHLORIDE 0.9 % IV SOLN
510.0000 mg | INTRAVENOUS | Status: DC
  Administered 2021-05-06: 510 mg via INTRAVENOUS
  Filled 2021-05-06: qty 510

## 2021-05-11 ENCOUNTER — Other Ambulatory Visit: Payer: Self-pay

## 2021-05-11 ENCOUNTER — Ambulatory Visit (INDEPENDENT_AMBULATORY_CARE_PROVIDER_SITE_OTHER): Payer: Medicare HMO | Admitting: Internal Medicine

## 2021-05-11 ENCOUNTER — Ambulatory Visit (INDEPENDENT_AMBULATORY_CARE_PROVIDER_SITE_OTHER): Payer: Medicare HMO

## 2021-05-11 ENCOUNTER — Encounter: Payer: Self-pay | Admitting: Internal Medicine

## 2021-05-11 VITALS — BP 138/82 | HR 67 | Temp 98.4°F | Ht 61.0 in | Wt 250.0 lb

## 2021-05-11 DIAGNOSIS — Z0000 Encounter for general adult medical examination without abnormal findings: Secondary | ICD-10-CM | POA: Diagnosis not present

## 2021-05-11 DIAGNOSIS — I482 Chronic atrial fibrillation, unspecified: Secondary | ICD-10-CM | POA: Diagnosis not present

## 2021-05-11 DIAGNOSIS — I890 Lymphedema, not elsewhere classified: Secondary | ICD-10-CM

## 2021-05-11 DIAGNOSIS — I10 Essential (primary) hypertension: Secondary | ICD-10-CM

## 2021-05-11 DIAGNOSIS — N184 Chronic kidney disease, stage 4 (severe): Secondary | ICD-10-CM

## 2021-05-11 MED ORDER — CARVEDILOL 25 MG PO TABS: 25.0000 mg | ORAL_TABLET | Freq: Two times a day (BID) | ORAL | 3 refills | Status: DC

## 2021-05-11 MED ORDER — TORSEMIDE 20 MG PO TABS: ORAL_TABLET | ORAL | 3 refills | Status: DC

## 2021-05-11 NOTE — Patient Instructions (Signed)
Katelyn Jackson , Thank you for taking time to come for your Medicare Wellness Visit. I appreciate your ongoing commitment to your health goals. Please review the following plan we discussed and let me know if I can assist you in the future.   Screening recommendations/referrals: Colonoscopy: last done 11/08/2015; due every 10 years Mammogram: last done 05/21/2020; due every year Bone Density: last done 08/18/2020; due every 2 years Recommended yearly ophthalmology/optometry visit for glaucoma screening and checkup Recommended yearly dental visit for hygiene and checkup  Vaccinations: Influenza vaccine: declined Pneumococcal vaccine: 04/23/2018 Tdap vaccine: declined Shingles vaccine: declined   Covid-19: declined  Advanced directives: Advance directive discussed with you today. Even though you declined this today please call our office should you change your mind and we can give you the proper paperwork for you to fill out.  Conditions/risks identified: No goals at this time.  Next appointment: Please schedule your next Medicare Wellness Visit with your Nurse Health Advisor in 1 year by calling [phone].   Preventive Care 8 Years and Older, Female Preventive care refers to lifestyle choices and visits with your health care provider that can promote health and wellness. What does preventive care include? A yearly physical exam. This is also called an annual well check. Dental exams once or twice a year. Routine eye exams. Ask your health care provider how often you should have your eyes checked. Personal lifestyle choices, including: Daily care of your teeth and gums. Regular physical activity. Eating a healthy diet. Avoiding tobacco and drug use. Limiting alcohol use. Practicing safe sex. Taking low-dose aspirin every day. Taking vitamin and mineral supplements as recommended by your health care provider. What happens during an annual well check? The services and screenings  done by your health care provider during your annual well check will depend on your age, overall health, lifestyle risk factors, and family history of disease. Counseling  Your health care provider may ask you questions about your: Alcohol use. Tobacco use. Drug use. Emotional well-being. Home and relationship well-being. Sexual activity. Eating habits. History of falls. Memory and ability to understand (cognition). Work and work Statistician. Reproductive health. Screening  You may have the following tests or measurements: Height, weight, and BMI. Blood pressure. Lipid and cholesterol levels. These may be checked every 5 years, or more frequently if you are over 89 years old. Skin check. Lung cancer screening. You may have this screening every year starting at age 8 if you have a 30-pack-year history of smoking and currently smoke or have quit within the past 15 years. Fecal occult blood test (FOBT) of the stool. You may have this test every year starting at age 28. Flexible sigmoidoscopy or colonoscopy. You may have a sigmoidoscopy every 5 years or a colonoscopy every 10 years starting at age 62. Hepatitis C blood test. Hepatitis B blood test. Sexually transmitted disease (STD) testing. Diabetes screening. This is done by checking your blood sugar (glucose) after you have not eaten for a while (fasting). You may have this done every 1-3 years. Bone density scan. This is done to screen for osteoporosis. You may have this done starting at age 68. Mammogram. This may be done every 1-2 years. Talk to your health care provider about how often you should have regular mammograms. Talk with your health care provider about your test results, treatment options, and if necessary, the need for more tests. Vaccines  Your health care provider may recommend certain vaccines, such as: Influenza vaccine. This is recommended every  year. Tetanus, diphtheria, and acellular pertussis (Tdap, Td)  vaccine. You may need a Td booster every 10 years. Zoster vaccine. You may need this after age 26. Pneumococcal 13-valent conjugate (PCV13) vaccine. One dose is recommended after age 70. Pneumococcal polysaccharide (PPSV23) vaccine. One dose is recommended after age 47. Talk to your health care provider about which screenings and vaccines you need and how often you need them. This information is not intended to replace advice given to you by your health care provider. Make sure you discuss any questions you have with your health care provider. Document Released: 11/20/2015 Document Revised: 07/13/2016 Document Reviewed: 08/25/2015 Elsevier Interactive Patient Education  2017 [place] Prevention in the Home Falls can cause injuries. They can happen to people of all ages. There are many things you can do to make your home safe and to help prevent falls. What can I do on the outside of my home? Regularly fix the edges of walkways and driveways and fix any cracks. Remove anything that might make you trip as you walk through a door, such as a raised step or threshold. Trim any bushes or trees on the path to your home. Use bright outdoor lighting. Clear any walking paths of anything that might make someone trip, such as rocks or tools. Regularly check to see if handrails are loose or broken. Make sure that both sides of any steps have handrails. Any raised decks and porches should have guardrails on the edges. Have any leaves, snow, or ice cleared regularly. Use sand or salt on walking paths during winter. Clean up any spills in your garage right away. This includes oil or grease spills. What can I do in the bathroom? Use night lights. Install grab bars by the toilet and in the tub and shower. Do not use towel bars as grab bars. Use non-skid mats or decals in the tub or shower. If you need to sit down in the shower, use a plastic, non-slip stool. Keep the floor dry. Clean up any  water that spills on the floor as soon as it happens. Remove soap buildup in the tub or shower regularly. Attach bath mats securely with double-sided non-slip rug tape. Do not have throw rugs and other things on the floor that can make you trip. What can I do in the bedroom? Use night lights. Make sure that you have a light by your bed that is easy to reach. Do not use any sheets or blankets that are too big for your bed. They should not hang down onto the floor. Have a firm chair that has side arms. You can use this for support while you get dressed. Do not have throw rugs and other things on the floor that can make you trip. What can I do in the kitchen? Clean up any spills right away. Avoid walking on wet floors. Keep items that you use a lot in easy-to-reach places. If you need to reach something above you, use a strong step stool that has a grab bar. Keep electrical cords out of the way. Do not use floor polish or wax that makes floors slippery. If you must use wax, use non-skid floor wax. Do not have throw rugs and other things on the floor that can make you trip. What can I do with my stairs? Do not leave any items on the stairs. Make sure that there are handrails on both sides of the stairs and use them. Fix handrails that are broken or  loose. Make sure that handrails are as long as the stairways. Check any carpeting to make sure that it is firmly attached to the stairs. Fix any carpet that is loose or worn. Avoid having throw rugs at the top or bottom of the stairs. If you do have throw rugs, attach them to the floor with carpet tape. Make sure that you have a light switch at the top of the stairs and the bottom of the stairs. If you do not have them, ask someone to add them for you. What else can I do to help prevent falls? Wear shoes that: Do not have high heels. Have rubber bottoms. Are comfortable and fit you well. Are closed at the toe. Do not wear sandals. If you use a  stepladder: Make sure that it is fully opened. Do not climb a closed stepladder. Make sure that both sides of the stepladder are locked into place. Ask someone to hold it for you, if possible. Clearly mark and make sure that you can see: Any grab bars or handrails. First and last steps. Where the edge of each step is. Use tools that help you move around (mobility aids) if they are needed. These include: Canes. Walkers. Scooters. Crutches. Turn on the lights when you go into a dark area. Replace any light bulbs as soon as they burn out. Set up your furniture so you have a clear path. Avoid moving your furniture around. If any of your floors are uneven, fix them. If there are any pets around you, be aware of where they are. Review your medicines with your doctor. Some medicines can make you feel dizzy. This can increase your chance of falling. Ask your doctor what other things that you can do to help prevent falls. This information is not intended to replace advice given to you by your health care provider. Make sure you discuss any questions you have with your health care provider. Document Released: 08/20/2009 Document Revised: 03/31/2016 Document Reviewed: 11/28/2014 Elsevier Interactive Patient Education  2017 [employer].

## 2021-05-11 NOTE — Progress Notes (Addendum)
Subjective:   Katelyn Jackson is a 75 y.o. female who presents for Medicare Annual (Subsequent) preventive examination.  Review of Systems     Cardiac Risk Factors include: advanced age (>82men, >49 women);dyslipidemia;obesity (BMI >30kg/m2);family history of premature cardiovascular disease;hypertension     Objective:    Today's Vitals   05/11/21 0900 05/11/21 0920  BP: 138/82   Pulse: 67   Temp: 98.4 F (36.9 C)   SpO2: 98%   Weight: 250 lb (113.4 kg)   Height: 5\' 1"  (1.549 m)   PainSc:  5    Body mass index is 47.24 kg/m.  Advanced Directives 05/11/2021 03/17/2021 03/01/2021 12/22/2020 06/10/2020 01/08/2019 11/09/2017  Does Patient Have a Medical Advance Directive? No No No No No No No  Would patient like information on creating a medical advance directive? No - Patient declined No - Patient declined No - Patient declined - - No - Patient declined -    Current Medications (verified) Outpatient Encounter Medications as of 05/11/2021  Medication Sig   allopurinol (ZYLOPRIM) 100 MG tablet Take 0.5 tablets (50 mg total) by mouth daily.   Cholecalciferol (VITAMIN D3) 2000 units capsule Take 1 capsule (2,000 Units total) by mouth daily.   Cyanocobalamin (VITAMIN B-12 PO) Take 2,000 mcg by mouth daily.   diclofenac Sodium (VOLTAREN) 1 % GEL APPLY 4 GRAMS FOUR TIMES DAILY AS NEEDED (Patient taking differently: Apply 4 g topically 4 (four) times daily as needed (pain). APPLY 4 GRAMS FOUR TIMES DAILY AS NEEDED)   exemestane (AROMASIN) 25 MG tablet Take 1 tablet (25 mg total) by mouth daily after breakfast.   ferrous sulfate 325 (65 FE) MG tablet Take 325 mg by mouth daily with breakfast.   levothyroxine (SYNTHROID) 50 MCG tablet Take 1 tablet (50 mcg total) by mouth daily before breakfast. Overdue for Annual appt must see provider for future refills   lipase/protease/amylase (CREON) 12000-38000 units CPEP capsule Take 1 capsule (12,000 Units total) by mouth daily as needed (stomach  problems).   lovastatin (MEVACOR) 20 MG tablet Take 1 tablet (20 mg total) by mouth daily.   magnesium oxide (MAG-OX) 400 MG tablet Take 400 mg by mouth 2 (two) times daily.   methocarbamol (ROBAXIN) 500 MG tablet Take 1 tablet (500 mg total) by mouth every 8 (eight) hours as needed for muscle spasms.   omeprazole (PRILOSEC) 40 MG capsule Take 1 capsule (40 mg total) by mouth daily. Overdue for Annual appt must see provider for future refills (Patient taking differently: Take 40 mg by mouth daily as needed (acid reflux). Overdue for Annual appt must see provider for future refills)   OVER THE COUNTER MEDICATION Apply 1 application topically daily as needed (apply to buttocks  as needed for irritaiton). Intensive Skin Care Therapy   polyethylene glycol powder (GLYCOLAX/MIRALAX) powder mix 1 capful (17 grams) IN 8 ounces OF liquid EVERY DAY (Patient taking differently: Take 17 g by mouth daily. mix 1 capful (17 grams) IN 8 ounces OF liquid EVERY DAY)   potassium chloride (KLOR-CON) 8 MEQ tablet Take 1 tablet (8 mEq total) by mouth daily.   traMADol (ULTRAM) 50 MG tablet Take 1 tablet (50 mg total) by mouth every 8 (eight) hours as needed for severe pain.   warfarin (COUMADIN) 5 MG tablet Take 1 tablet daily except take 1/2 tablet on Wed and Sat or Take as directed by anticoagulation clinic (Patient taking differently: Take 2.5 mg by mouth daily. Take as directed by anticoagulation clinic)   [DISCONTINUED] carvedilol (  COREG) 12.5 MG tablet Take 12.5 mg by mouth 2 (two) times daily with a meal.   [DISCONTINUED] torsemide (DEMADEX) 20 MG tablet Take by mouth. Take 60 mg every morning and 40 mg in the pm   No facility-administered encounter medications on file as of 05/11/2021.    Allergies (verified) Chlorhexidine gluconate and Penicillins   History: Past Medical History:  Diagnosis Date   Arthritis    Cancer ([place])    breast cancer   Chronic atrial fibrillation (HCC)    Chronic kidney disease     sees Dr Florene Glen   Cramps, muscle, general    Dyspnea on exertion    Dysrhythmia    a-fib,    GERD (gastroesophageal reflux disease)    Headache    Hyperlipidemia    Hypertension    Hypothyroidism    Lymphedema    Moderate to severe pulmonary hypertension (HCC)    Obesity    Personal history of radiation therapy    Pneumonia 12/2020   Presence of permanent cardiac pacemaker 01/21/2021   for bradycardia    Syncope 12/2020   needed a pacemaker   Past Surgical History:  Procedure Laterality Date   APPENDECTOMY     BREAST BIOPSY Left 2018   BREAST LUMPECTOMY Left 04/04/2017   x3   BREAST LUMPECTOMY WITH NEEDLE LOCALIZATION AND AXILLARY SENTINEL LYMPH NODE BX Left 04/04/2017   Procedure: BREAST LUMPECTOMY WITH NEEDLE LOCALIZATION x3 AND AXILLARY SENTINEL LYMPH NODE BX;  Surgeon: [name], MD;  Location: [facility];  Service: General;  Laterality: Left;   CATARACT EXTRACTION W/ INTRAOCULAR LENS  IMPLANT, BILATERAL Bilateral 2018   COLONOSCOPY     EYE SURGERY     GLAUCOMA SURGERY Bilateral 2018   INSERT / REPLACE / REMOVE PACEMAKER  01/21/2021   RE-EXCISION OF BREAST CANCER,SUPERIOR MARGINS Left 04/26/2017   Procedure: RE-EXCISION OF LEFT BREAST CANCER;  Surgeon: [name], MD;  Location: [facility];  Service: General;  Laterality: Left;   SHOULDER HEMI-ARTHROPLASTY Right 03/17/2021   Procedure: SHOULDER HEMI-ARTHROPLASTY;  Surgeon: Hiram Gash, MD;  Location: [facility];  Service: Orthopedics;  Laterality: Right;   Family History  Problem Relation Age of Onset   CAD Mother 47   Stroke Mother 13       hemorr CVA   COPD Father 9   Cancer Neg Hx    Social History   Socioeconomic History   Marital status: Widowed    Spouse name: Not on file   Number of children: 1   Years of education: Not on file   Highest education level: Not on file  Occupational History   Not on file  Tobacco Use   Smoking status: Never   Smokeless tobacco: Never  Vaping Use   Vaping Use: Never used   Substance and Sexual Activity   Alcohol use: No    Alcohol/week: 0.0 standard drinks   Drug use: No   Sexual activity: Not on file  Other Topics Concern   Not on file  Social History Narrative   Not on file   Social Determinants of Health   Financial Resource Strain: Low Risk    Difficulty of Paying Living Expenses: Not hard at all  Food Insecurity: No Food Insecurity   Worried About Charity fundraiser in the Last Year: Never true   [place] in the Last Year: Never true  Transportation Needs: Unmet Transportation Needs   Lack of Transportation (Medical): Yes   Lack of Transportation (  Non-Medical): Yes  Physical Activity: Inactive   Days of Exercise per Week: 0 days   Minutes of Exercise per Session: 0 min  Stress: No Stress Concern Present   Feeling of Stress : Not at all  Social Connections: Socially Isolated   Frequency of Communication with Friends and Family: More than three times a week   Frequency of Social Gatherings with Friends and Family: Once a week   Attends Religious Services: Never   Marine scientist or Organizations: No   Attends Archivist Meetings: Never   Marital Status: Widowed    Tobacco Counseling Counseling given: Not Answered   Clinical Intake:  Pre-visit preparation completed: Yes  Pain : 0-10 Pain Score: 5  Pain Type: Chronic pain Pain Location: Shoulder Pain Orientation: Right Pain Onset: More than a month ago Pain Frequency: Constant Pain Relieving Factors: tramadol Effect of Pain on Daily Activities: Pain produces disability and affects the quality of life.  Pain Relieving Factors: tramadol  BMI - recorded: 47.24 Nutritional Status: BMI > 30  Obese Nutritional Risks: None Diabetes: No  How often do you need to have someone help you when you read instructions, pamphlets, or other written materials from your doctor or pharmacy?: 1 - Never What is the last grade level you completed in school?:  College  Diabetic? no  Interpreter Needed?: Yes Interpreter Agency: [place] Interpreter Name: Rosalyn Charters Interpreter ID: [name] Interpreter Patient Declined Interpreter : No  Information entered by :: Lisette Abu, LPN   Activities of Daily Living In your present state of health, do you have any difficulty performing the following activities: 05/11/2021 03/17/2021  Hearing? N N  Vision? N N  Difficulty concentrating or making decisions? N N  Walking or climbing stairs? Y Y  Dressing or bathing? Y Y  Doing errands, shopping? Y N  Preparing Food and eating ? Y -  Using the Toilet? Y -  In the past six months, have you accidently leaked urine? N -  Do you have problems with loss of bowel control? N -  Managing your Medications? Y -  Managing your Finances? Y -  Comment lives with son -  Housekeeping or managing your Housekeeping? Y -  Some recent data might be hidden    Patient Care Team: Plotnikov, [name], MD as PCP - General (Internal Medicine) [name], MD as Consulting Physician (General Surgery) Truitt Merle, MD as Consulting Physician (Hematology) Kyung Rudd, MD as Consulting Physician (Radiation Oncology) Estanislado Emms, MD (Inactive) as Consulting Physician (Nephrology) Lorretta Harp, MD as Consulting Physician (Cardiology) Verner Chol, MD as Consulting Physician (Sports Medicine) Delice Bison, Charlestine Massed, NP as Nurse Practitioner (Hematology and Oncology)  Indicate any recent Medical Services you may have received from other than [facility] providers in the past year (date may be approximate).     Assessment:   This is a routine wellness examination for [place].  Hearing/Vision screen No results found.  Dietary issues and exercise activities discussed: Current Exercise Habits: The patient does not participate in regular exercise at present   Goals Addressed   None   Depression Screen PHQ 2/9 Scores 05/11/2021 01/08/2019 09/05/2017  05/17/2017  PHQ - 2 Score 0 0 0 0    Fall Risk Fall Risk  05/11/2021 05/11/2021 01/08/2019 09/05/2017 05/17/2017  Falls in the past year? 1 1 0 No No  Number falls in past yr: 1 1 - - -  Injury with Fall? 1 1 - - -  Risk for fall due to : History of fall(s);Impaired balance/gait;Impaired mobility Impaired balance/gait;Impaired mobility;History of fall(s) Impaired balance/gait;Impaired mobility - -  Follow up Falls evaluation completed - Falls prevention discussed - -    FALL RISK PREVENTION PERTAINING TO THE HOME:  Any stairs in or around the home? Yes  (bedroom on first floor) If so, are there any without handrails? No  Home free of loose throw rugs in walkways, pet beds, electrical cords, etc? Yes  Adequate lighting in your home to reduce risk of falls? Yes   ASSISTIVE DEVICES UTILIZED TO PREVENT FALLS:  Life alert? No  Use of a cane, walker or w/c? Yes  Grab bars in the bathroom? Yes  Shower chair or bench in shower? No  Elevated toilet seat or a handicapped toilet? No  (bedside commode)  TIMED UP AND GO:  Was the test performed? No .  Length of time to ambulate 10 feet: 0 sec.   Appearnce of gait: patient in wheelchair  Cognitive Function: Normal cognitive status assessed by direct observation by this Nurse Health Advisor. No abnormalities found.          Immunizations Immunization History  Administered Date(s) Administered   Influenza, High Dose Seasonal PF 07/11/2017, 08/06/2018   Pneumococcal Conjugate-13 04/23/2018    TDAP status: Due, Education has been provided regarding the importance of this vaccine. Advised may receive this vaccine at local pharmacy or Health Dept. Aware to provide a copy of the vaccination record if obtained from local pharmacy or Health Dept. Verbalized acceptance and understanding.  Flu Vaccine status: Declined, Education has been provided regarding the importance of this vaccine but patient still declined. Advised may receive this vaccine at  local pharmacy or Health Dept. Aware to provide a copy of the vaccination record if obtained from local pharmacy or Health Dept. Verbalized acceptance and understanding.  Pneumococcal vaccine status: Declined,  Education has been provided regarding the importance of this vaccine but patient still declined. Advised may receive this vaccine at local pharmacy or Health Dept. Aware to provide a copy of the vaccination record if obtained from local pharmacy or Health Dept. Verbalized acceptance and understanding.   Covid-19 vaccine status: Declined, Education has been provided regarding the importance of this vaccine but patient still declined. Advised may receive this vaccine at local pharmacy or Health Dept.or vaccine clinic. Aware to provide a copy of the vaccination record if obtained from local pharmacy or Health Dept. Verbalized acceptance and understanding.  Qualifies for Shingles Vaccine? Yes   Zostavax completed No   Shingrix Completed?: No.    Education has been provided regarding the importance of this vaccine. Patient has been advised to call insurance company to determine out of pocket expense if they have not yet received this vaccine. Advised may also receive vaccine at local pharmacy or Health Dept. Verbalized acceptance and understanding.  Screening Tests Health Maintenance  Topic Date Due   Hepatitis C Screening  Never done   TETANUS/TDAP  Never done   Zoster Vaccines- Shingrix (1 of 2) Never done   PNA vac Low Risk Adult (2 of 2 - PPSV23) 04/24/2019   COVID-19 Vaccine (1) 05/27/2021 (Originally 12/05/1950)   INFLUENZA VACCINE  06/07/2021   COLONOSCOPY (Pts 45-35yrs Insurance coverage will need to be confirmed)  11/07/2025   DEXA SCAN  Completed   HPV VACCINES  Aged Out    Health Maintenance  Health Maintenance Due  Topic Date Due   Hepatitis C Screening  Never done  TETANUS/TDAP  Never done   Zoster Vaccines- Shingrix (1 of 2) Never done   PNA vac Low Risk Adult (2 of 2 -  PPSV23) 04/24/2019    Colorectal cancer screening: No longer required.  (Patient declined)  Mammogram status: Completed 05/21/2020. Repeat every year  Bone Density status: Completed 08/18/2020. Results reflect: Bone density results: OSTEOPOROSIS. Repeat every 2 years.  Lung Cancer Screening: (Low Dose CT Chest recommended if Age 28-80 years, 30 pack-year currently smoking OR have quit w/in 15years.) does not qualify.   Lung Cancer Screening Referral: no  Additional Screening:  Hepatitis C Screening: does qualify; Completed no  Vision Screening: Recommended annual ophthalmology exams for early detection of glaucoma and other disorders of the eye. Is the patient up to date with their annual eye exam?  Yes  Who is the provider or what is the name of the office in which the patient attends annual eye exams? [facility] If pt is not established with a provider, would they like to be referred to a provider to establish care? No .   Dental Screening: Recommended annual dental exams for proper oral hygiene  Community Resource Referral / Chronic Care Management: CRR required this visit?  No   CCM required this visit?  No      Plan:     I have personally reviewed and noted the following in the patient's chart:   Medical and social history Use of alcohol, tobacco or illicit drugs  Current medications and supplements including opioid prescriptions.  Functional ability and status Nutritional status Physical activity Advanced directives List of other physicians Hospitalizations, surgeries, and ER visits in previous 12 months Vitals Screenings to include cognitive, depression, and falls Referrals and appointments  In addition, I have reviewed and discussed with patient certain preventive protocols, quality metrics, and best practice recommendations. A written personalized care plan for preventive services as well as general preventive health recommendations were provided to  patient.     Sheral Flow, LPN   06/11/6313   Nurse Notes: n/a  Medical screening examination/treatment/procedure(s) were performed by non-physician practitioner and as supervising physician I was immediately available for consultation/collaboration.  I agree with above. Lew Dawes, MD

## 2021-05-11 NOTE — Progress Notes (Signed)
Subjective:  Patient ID: Katelyn Jackson, female    DOB: 10-Aug-1946  Age: 75 y.o. MRN: [id number]  CC: Follow-up   HPI Katelyn Jackson presents for leg edema L>R. Pt lost 45 lbs on a higheer dose of Torsemide 60 mg am, 40 mg at 2 pm. Recent labs were ok.  F/u CHF, CRF, HTN - on Coreg 25 mg bid  Outpatient Medications Prior to Visit  Medication Sig Dispense Refill   allopurinol (ZYLOPRIM) 100 MG tablet Take 0.5 tablets (50 mg total) by mouth daily. 45 tablet 3   carvedilol (COREG) 12.5 MG tablet Take 12.5 mg by mouth 2 (two) times daily with a meal.     Cholecalciferol (VITAMIN D3) 2000 units capsule Take 1 capsule (2,000 Units total) by mouth daily. 100 capsule 3   Cyanocobalamin (VITAMIN B-12 PO) Take 2,000 mcg by mouth daily.     diclofenac Sodium (VOLTAREN) 1 % GEL APPLY 4 GRAMS FOUR TIMES DAILY AS NEEDED (Patient taking differently: Apply 4 g topically 4 (four) times daily as needed (pain). APPLY 4 GRAMS FOUR TIMES DAILY AS NEEDED) 100 g 5   exemestane (AROMASIN) 25 MG tablet Take 1 tablet (25 mg total) by mouth daily after breakfast. 90 tablet 3   ferrous sulfate 325 (65 FE) MG tablet Take 325 mg by mouth daily with breakfast.     levothyroxine (SYNTHROID) 50 MCG tablet Take 1 tablet (50 mcg total) by mouth daily before breakfast. Overdue for Annual appt must see provider for future refills 90 tablet 3   lipase/protease/amylase (CREON) 12000-38000 units CPEP capsule Take 1 capsule (12,000 Units total) by mouth daily as needed (stomach problems). 270 capsule 3   lovastatin (MEVACOR) 20 MG tablet Take 1 tablet (20 mg total) by mouth daily. 90 tablet 3   magnesium oxide (MAG-OX) 400 MG tablet Take 400 mg by mouth 2 (two) times daily.     methocarbamol (ROBAXIN) 500 MG tablet Take 1 tablet (500 mg total) by mouth every 8 (eight) hours as needed for muscle spasms. 20 tablet 0   omeprazole (PRILOSEC) 40 MG capsule Take 1 capsule (40 mg total) by mouth daily. Overdue for Annual appt must  see provider for future refills (Patient taking differently: Take 40 mg by mouth daily as needed (acid reflux). Overdue for Annual appt must see provider for future refills) 90 capsule 3   OVER THE COUNTER MEDICATION Apply 1 application topically daily as needed (apply to buttocks  as needed for irritaiton). Intensive Skin Care Therapy     polyethylene glycol powder (GLYCOLAX/MIRALAX) powder mix 1 capful (17 grams) IN 8 ounces OF liquid EVERY DAY (Patient taking differently: Take 17 g by mouth daily. mix 1 capful (17 grams) IN 8 ounces OF liquid EVERY DAY) 1581 g 3   potassium chloride (KLOR-CON) 8 MEQ tablet Take 1 tablet (8 mEq total) by mouth daily. 90 tablet 3   torsemide (DEMADEX) 20 MG tablet Take by mouth. Take 60 mg every morning and 40 mg in the pm     traMADol (ULTRAM) 50 MG tablet Take 1 tablet (50 mg total) by mouth every 8 (eight) hours as needed for severe pain. 30 tablet 0   warfarin (COUMADIN) 5 MG tablet Take 1 tablet daily except take 1/2 tablet on Wed and Sat or Take as directed by anticoagulation clinic (Patient taking differently: Take 2.5 mg by mouth daily. Take as directed by anticoagulation clinic) 90 tablet 1   carvedilol (COREG) 6.25 MG tablet Take 1 tablet (6.25 mg  total) by mouth 2 (two) times daily with a meal. Overdue for Annual appt must see provider for future refills 180 tablet 3   No facility-administered medications prior to visit.    ROS: Review of Systems  Constitutional:  Positive for fatigue. Negative for activity change, appetite change, chills and unexpected weight change.  HENT:  Negative for congestion, mouth sores and sinus pressure.   Eyes:  Negative for visual disturbance.  Respiratory:  Negative for cough and chest tightness.   Cardiovascular:  Positive for leg swelling.  Gastrointestinal:  Negative for abdominal pain and nausea.  Genitourinary:  Negative for difficulty urinating, frequency and vaginal pain.  Musculoskeletal:  Positive for  arthralgias and gait problem. Negative for back pain.  Skin:  Negative for pallor and rash.  Neurological:  Positive for weakness. Negative for dizziness, tremors, numbness and headaches.  Psychiatric/Behavioral:  Negative for confusion, sleep disturbance and suicidal ideas. The patient is not nervous/anxious.    Objective:  BP 138/82 (BP Location: Left Arm)   Pulse 67   Temp 98.4 F (36.9 C) (Oral)   LMP  (LMP Unknown)   SpO2 98%   BP Readings from Last 3 Encounters:  05/11/21 138/82  05/11/21 138/82  05/06/21 126/68    Wt Readings from Last 3 Encounters:  04/29/21 250 lb (113.4 kg)  03/17/21 253 lb (114.8 kg)  03/01/21 253 lb (114.8 kg)    Physical Exam Constitutional:      General: She is not in acute distress.    Appearance: She is well-developed. She is obese.  HENT:     Head: Normocephalic.     Right Ear: External ear normal.     Left Ear: External ear normal.     Nose: Nose normal.  Eyes:     General:        Right eye: No discharge.        Left eye: No discharge.     Conjunctiva/sclera: Conjunctivae normal.     Pupils: Pupils are equal, round, and reactive to light.  Neck:     Thyroid: No thyromegaly.     Vascular: No JVD.     Trachea: No tracheal deviation.  Cardiovascular:     Rate and Rhythm: Normal rate and regular rhythm.     Heart sounds: Normal heart sounds.  Pulmonary:     Effort: No respiratory distress.     Breath sounds: No stridor. No wheezing.  Abdominal:     General: Bowel sounds are normal. There is no distension.     Palpations: Abdomen is soft. There is no mass.     Tenderness: There is no abdominal tenderness. There is no guarding or rebound.  Musculoskeletal:        General: No tenderness.     Cervical back: Normal range of motion and neck supple. No rigidity.     Right lower leg: Edema present.     Left lower leg: Edema present.  Lymphadenopathy:     Cervical: No cervical adenopathy.  Skin:    Findings: No erythema or rash.   Neurological:     Cranial Nerves: No cranial nerve deficit.     Motor: No abnormal muscle tone.     Coordination: Coordination normal.     Deep Tendon Reflexes: Reflexes normal.  Psychiatric:        Behavior: Behavior normal.        Thought Content: Thought content normal.        Judgment: Judgment normal.   LLE 3+  edema RLE 2+ edema The patient is a morbidly obese.  She is in a wheelchair.  Her son [name] is here with her.  The history was obtained from the patient and her son.  Lab Results  Component Value Date   WBC 7.1 03/20/2021   HGB 9.1 (L) 03/20/2021   HCT 31.2 (L) 03/20/2021   PLT 182 03/20/2021   GLUCOSE 86 03/20/2021   ALT 33 06/10/2020   AST 24 06/10/2020   NA 137 03/20/2021   K 3.8 03/20/2021   CL 101 03/20/2021   CREATININE 1.59 (H) 03/20/2021   BUN 22 03/20/2021   CO2 27 03/20/2021   TSH 6.26 (H) 01/05/2018   INR 1.9 (A) 05/04/2021   HGBA1C 5.5 01/05/2018    No results found.  Assessment & Plan:   Walker Kehr, MD

## 2021-05-11 NOTE — Assessment & Plan Note (Addendum)
Lasix, Avalide HCT, Coreg increased to 25 mg twice daily

## 2021-05-11 NOTE — Assessment & Plan Note (Addendum)
Lymphedema and fluid retention-continue on Demadex Resume SCD machine use when appropriate

## 2021-05-11 NOTE — Assessment & Plan Note (Addendum)
Avalide HCT, Lasix.  Follow-up with nephrology

## 2021-05-12 ENCOUNTER — Encounter: Payer: Self-pay | Admitting: Internal Medicine

## 2021-05-12 DIAGNOSIS — I5023 Acute on chronic systolic (congestive) heart failure: Secondary | ICD-10-CM | POA: Diagnosis not present

## 2021-05-12 DIAGNOSIS — N184 Chronic kidney disease, stage 4 (severe): Secondary | ICD-10-CM | POA: Diagnosis not present

## 2021-05-12 DIAGNOSIS — D631 Anemia in chronic kidney disease: Secondary | ICD-10-CM | POA: Diagnosis not present

## 2021-05-12 DIAGNOSIS — L89892 Pressure ulcer of other site, stage 2: Secondary | ICD-10-CM | POA: Diagnosis not present

## 2021-05-12 DIAGNOSIS — D62 Acute posthemorrhagic anemia: Secondary | ICD-10-CM | POA: Diagnosis not present

## 2021-05-12 DIAGNOSIS — L89312 Pressure ulcer of right buttock, stage 2: Secondary | ICD-10-CM | POA: Diagnosis not present

## 2021-05-12 DIAGNOSIS — I130 Hypertensive heart and chronic kidney disease with heart failure and stage 1 through stage 4 chronic kidney disease, or unspecified chronic kidney disease: Secondary | ICD-10-CM | POA: Diagnosis not present

## 2021-05-12 DIAGNOSIS — S42201D Unspecified fracture of upper end of right humerus, subsequent encounter for fracture with routine healing: Secondary | ICD-10-CM | POA: Diagnosis not present

## 2021-05-12 DIAGNOSIS — S42101D Fracture of unspecified part of scapula, right shoulder, subsequent encounter for fracture with routine healing: Secondary | ICD-10-CM | POA: Diagnosis not present

## 2021-05-12 NOTE — Assessment & Plan Note (Signed)
Continue with Coumadin. 

## 2021-05-16 DIAGNOSIS — R531 Weakness: Secondary | ICD-10-CM | POA: Diagnosis not present

## 2021-05-16 DIAGNOSIS — Z950 Presence of cardiac pacemaker: Secondary | ICD-10-CM | POA: Diagnosis not present

## 2021-05-17 ENCOUNTER — Telehealth: Payer: Self-pay

## 2021-05-17 NOTE — Telephone Encounter (Signed)
   Telephone encounter was:  Successful.  05/17/2021 Name: Katelyn Jackson MRN: [id number] DOB: 1946/01/22  Katelyn Jackson is a 75 y.o. year old female who is a primary care patient of Plotnikov, [name], MD . The community resource team was consulted for assistance with Transportation Needs   Care guide performed the following interventions: Spoke with Alfia Danielson/[facility] Interpreter about [employer] she stated that either she or patient's son could all when needed .  I have  contacted [place] and left a message for the [employer]..  Follow Up Plan:  Care guide will follow up with patient by phone over the next 7 days  Danaka Llera, AAS Paralegal, [employer]  [address],  [address] ??[email or web address]  ?? [phone]   [email or web address]

## 2021-05-19 ENCOUNTER — Telehealth: Payer: Self-pay

## 2021-05-19 DIAGNOSIS — D631 Anemia in chronic kidney disease: Secondary | ICD-10-CM | POA: Diagnosis not present

## 2021-05-19 DIAGNOSIS — S42101D Fracture of unspecified part of scapula, right shoulder, subsequent encounter for fracture with routine healing: Secondary | ICD-10-CM | POA: Diagnosis not present

## 2021-05-19 DIAGNOSIS — I130 Hypertensive heart and chronic kidney disease with heart failure and stage 1 through stage 4 chronic kidney disease, or unspecified chronic kidney disease: Secondary | ICD-10-CM | POA: Diagnosis not present

## 2021-05-19 DIAGNOSIS — L89312 Pressure ulcer of right buttock, stage 2: Secondary | ICD-10-CM | POA: Diagnosis not present

## 2021-05-19 DIAGNOSIS — L89892 Pressure ulcer of other site, stage 2: Secondary | ICD-10-CM | POA: Diagnosis not present

## 2021-05-19 DIAGNOSIS — S42201D Unspecified fracture of upper end of right humerus, subsequent encounter for fracture with routine healing: Secondary | ICD-10-CM | POA: Diagnosis not present

## 2021-05-19 DIAGNOSIS — N184 Chronic kidney disease, stage 4 (severe): Secondary | ICD-10-CM | POA: Diagnosis not present

## 2021-05-19 DIAGNOSIS — I5023 Acute on chronic systolic (congestive) heart failure: Secondary | ICD-10-CM | POA: Diagnosis not present

## 2021-05-19 DIAGNOSIS — D62 Acute posthemorrhagic anemia: Secondary | ICD-10-CM | POA: Diagnosis not present

## 2021-05-19 NOTE — Telephone Encounter (Signed)
   Telephone encounter was:  Successful.  05/19/2021 Name: Katelyn Jackson MRN: [id number] DOB: 1946-03-02  Katelyn Jackson is a 75 y.o. year old female who is a primary care patient of Plotnikov, [name], MD . The community resource team was consulted for assistance with Transportation Needs   Care guide performed the following interventions: Spoke with [place] to let her know I received a call back from [place] at [employer] and do not transport Medicaid patients.  Katelyn Jackson has the number for Medicaid transportation and stated that either she or patient's son will call them when needed. .  Follow Up Plan:  No further follow up planned at this time. The patient has been provided with needed resources.  Katelyn Jackson, AAS Paralegal, [place]  [address], [place] [address] ??[email or web address]  ?? [phone]   [email or web address]

## 2021-05-21 ENCOUNTER — Telehealth: Payer: Self-pay | Admitting: Internal Medicine

## 2021-05-21 NOTE — Telephone Encounter (Signed)
[place] Name: [facility] Agency Name: Santina Evans Phone #: [phone] LVM  Service Requested: OT  (examples: OT/PT/Skilled Nursing/Social Work/Speech Therapy/Wound Care) Frequency of Visits: 2w3, 1w1

## 2021-05-21 NOTE — Telephone Encounter (Signed)
Pt is up-to-date w/ visits. Called Don back there was no answer LMOM ok for verbal../lmb

## 2021-05-23 NOTE — Telephone Encounter (Signed)
Okay.  Thanks.

## 2021-05-25 ENCOUNTER — Ambulatory Visit
Admission: RE | Admit: 2021-05-25 | Discharge: 2021-05-25 | Disposition: A | Discharge status: Home / Self Care | Payer: Medicare HMO | Source: Ambulatory Visit | Attending: Hematology | Admitting: Hematology

## 2021-05-25 ENCOUNTER — Other Ambulatory Visit: Payer: Self-pay

## 2021-05-25 ENCOUNTER — Other Ambulatory Visit: Payer: Self-pay | Admitting: Hematology

## 2021-05-25 DIAGNOSIS — C50412 Malignant neoplasm of upper-outer quadrant of left female breast: Secondary | ICD-10-CM

## 2021-05-25 DIAGNOSIS — Z1231 Encounter for screening mammogram for malignant neoplasm of breast: Secondary | ICD-10-CM | POA: Diagnosis not present

## 2021-05-25 DIAGNOSIS — Z170 Estrogen receptor positive status [ER+]: Secondary | ICD-10-CM

## 2021-05-26 DIAGNOSIS — S42101D Fracture of unspecified part of scapula, right shoulder, subsequent encounter for fracture with routine healing: Secondary | ICD-10-CM | POA: Diagnosis not present

## 2021-05-26 DIAGNOSIS — N184 Chronic kidney disease, stage 4 (severe): Secondary | ICD-10-CM | POA: Diagnosis not present

## 2021-05-26 DIAGNOSIS — C50412 Malignant neoplasm of upper-outer quadrant of left female breast: Secondary | ICD-10-CM | POA: Diagnosis not present

## 2021-05-26 DIAGNOSIS — S42201D Unspecified fracture of upper end of right humerus, subsequent encounter for fracture with routine healing: Secondary | ICD-10-CM | POA: Diagnosis not present

## 2021-05-26 DIAGNOSIS — D631 Anemia in chronic kidney disease: Secondary | ICD-10-CM | POA: Diagnosis not present

## 2021-05-26 DIAGNOSIS — I272 Pulmonary hypertension, unspecified: Secondary | ICD-10-CM | POA: Diagnosis not present

## 2021-05-26 DIAGNOSIS — I5023 Acute on chronic systolic (congestive) heart failure: Secondary | ICD-10-CM | POA: Diagnosis not present

## 2021-05-26 DIAGNOSIS — D62 Acute posthemorrhagic anemia: Secondary | ICD-10-CM | POA: Diagnosis not present

## 2021-05-26 DIAGNOSIS — I130 Hypertensive heart and chronic kidney disease with heart failure and stage 1 through stage 4 chronic kidney disease, or unspecified chronic kidney disease: Secondary | ICD-10-CM | POA: Diagnosis not present

## 2021-05-28 DIAGNOSIS — D631 Anemia in chronic kidney disease: Secondary | ICD-10-CM | POA: Diagnosis not present

## 2021-05-28 DIAGNOSIS — N184 Chronic kidney disease, stage 4 (severe): Secondary | ICD-10-CM | POA: Diagnosis not present

## 2021-05-28 DIAGNOSIS — C50412 Malignant neoplasm of upper-outer quadrant of left female breast: Secondary | ICD-10-CM | POA: Diagnosis not present

## 2021-05-28 DIAGNOSIS — S42101D Fracture of unspecified part of scapula, right shoulder, subsequent encounter for fracture with routine healing: Secondary | ICD-10-CM | POA: Diagnosis not present

## 2021-05-28 DIAGNOSIS — I5023 Acute on chronic systolic (congestive) heart failure: Secondary | ICD-10-CM | POA: Diagnosis not present

## 2021-05-28 DIAGNOSIS — D62 Acute posthemorrhagic anemia: Secondary | ICD-10-CM | POA: Diagnosis not present

## 2021-05-28 DIAGNOSIS — I272 Pulmonary hypertension, unspecified: Secondary | ICD-10-CM | POA: Diagnosis not present

## 2021-05-28 DIAGNOSIS — S42201D Unspecified fracture of upper end of right humerus, subsequent encounter for fracture with routine healing: Secondary | ICD-10-CM | POA: Diagnosis not present

## 2021-05-28 DIAGNOSIS — I130 Hypertensive heart and chronic kidney disease with heart failure and stage 1 through stage 4 chronic kidney disease, or unspecified chronic kidney disease: Secondary | ICD-10-CM | POA: Diagnosis not present

## 2021-05-31 DIAGNOSIS — S42101D Fracture of unspecified part of scapula, right shoulder, subsequent encounter for fracture with routine healing: Secondary | ICD-10-CM | POA: Diagnosis not present

## 2021-05-31 DIAGNOSIS — S42201D Unspecified fracture of upper end of right humerus, subsequent encounter for fracture with routine healing: Secondary | ICD-10-CM | POA: Diagnosis not present

## 2021-05-31 DIAGNOSIS — N184 Chronic kidney disease, stage 4 (severe): Secondary | ICD-10-CM | POA: Diagnosis not present

## 2021-05-31 DIAGNOSIS — C50412 Malignant neoplasm of upper-outer quadrant of left female breast: Secondary | ICD-10-CM | POA: Diagnosis not present

## 2021-05-31 DIAGNOSIS — I272 Pulmonary hypertension, unspecified: Secondary | ICD-10-CM | POA: Diagnosis not present

## 2021-05-31 DIAGNOSIS — D631 Anemia in chronic kidney disease: Secondary | ICD-10-CM | POA: Diagnosis not present

## 2021-05-31 DIAGNOSIS — I130 Hypertensive heart and chronic kidney disease with heart failure and stage 1 through stage 4 chronic kidney disease, or unspecified chronic kidney disease: Secondary | ICD-10-CM | POA: Diagnosis not present

## 2021-05-31 DIAGNOSIS — I5023 Acute on chronic systolic (congestive) heart failure: Secondary | ICD-10-CM | POA: Diagnosis not present

## 2021-05-31 DIAGNOSIS — D62 Acute posthemorrhagic anemia: Secondary | ICD-10-CM | POA: Diagnosis not present

## 2021-06-01 ENCOUNTER — Ambulatory Visit (INDEPENDENT_AMBULATORY_CARE_PROVIDER_SITE_OTHER): Payer: Medicare HMO | Admitting: General Practice

## 2021-06-01 DIAGNOSIS — Z7901 Long term (current) use of anticoagulants: Secondary | ICD-10-CM | POA: Diagnosis not present

## 2021-06-01 DIAGNOSIS — I482 Chronic atrial fibrillation, unspecified: Secondary | ICD-10-CM | POA: Diagnosis not present

## 2021-06-01 DIAGNOSIS — I4821 Permanent atrial fibrillation: Secondary | ICD-10-CM | POA: Diagnosis not present

## 2021-06-01 LAB — POCT INR: INR: 2 (ref 2.0–3.0)

## 2021-06-02 ENCOUNTER — Telehealth: Payer: Self-pay | Admitting: Internal Medicine

## 2021-06-02 DIAGNOSIS — I5023 Acute on chronic systolic (congestive) heart failure: Secondary | ICD-10-CM

## 2021-06-02 DIAGNOSIS — E039 Hypothyroidism, unspecified: Secondary | ICD-10-CM

## 2021-06-02 DIAGNOSIS — R296 Repeated falls: Secondary | ICD-10-CM

## 2021-06-02 DIAGNOSIS — Z7901 Long term (current) use of anticoagulants: Secondary | ICD-10-CM

## 2021-06-02 DIAGNOSIS — D62 Acute posthemorrhagic anemia: Secondary | ICD-10-CM | POA: Diagnosis not present

## 2021-06-02 DIAGNOSIS — S42201D Unspecified fracture of upper end of right humerus, subsequent encounter for fracture with routine healing: Secondary | ICD-10-CM | POA: Diagnosis not present

## 2021-06-02 DIAGNOSIS — I482 Chronic atrial fibrillation, unspecified: Secondary | ICD-10-CM

## 2021-06-02 DIAGNOSIS — M170 Bilateral primary osteoarthritis of knee: Secondary | ICD-10-CM

## 2021-06-02 DIAGNOSIS — N184 Chronic kidney disease, stage 4 (severe): Secondary | ICD-10-CM

## 2021-06-02 DIAGNOSIS — E785 Hyperlipidemia, unspecified: Secondary | ICD-10-CM

## 2021-06-02 DIAGNOSIS — Z170 Estrogen receptor positive status [ER+]: Secondary | ICD-10-CM

## 2021-06-02 DIAGNOSIS — Z950 Presence of cardiac pacemaker: Secondary | ICD-10-CM

## 2021-06-02 DIAGNOSIS — Z96611 Presence of right artificial shoulder joint: Secondary | ICD-10-CM

## 2021-06-02 DIAGNOSIS — I130 Hypertensive heart and chronic kidney disease with heart failure and stage 1 through stage 4 chronic kidney disease, or unspecified chronic kidney disease: Secondary | ICD-10-CM | POA: Diagnosis not present

## 2021-06-02 DIAGNOSIS — C50412 Malignant neoplasm of upper-outer quadrant of left female breast: Secondary | ICD-10-CM

## 2021-06-02 DIAGNOSIS — I083 Combined rheumatic disorders of mitral, aortic and tricuspid valves: Secondary | ICD-10-CM

## 2021-06-02 DIAGNOSIS — M479 Spondylosis, unspecified: Secondary | ICD-10-CM

## 2021-06-02 DIAGNOSIS — Z6841 Body Mass Index (BMI) 40.0 and over, adult: Secondary | ICD-10-CM

## 2021-06-02 DIAGNOSIS — I890 Lymphedema, not elsewhere classified: Secondary | ICD-10-CM

## 2021-06-02 DIAGNOSIS — Z923 Personal history of irradiation: Secondary | ICD-10-CM

## 2021-06-02 DIAGNOSIS — K219 Gastro-esophageal reflux disease without esophagitis: Secondary | ICD-10-CM

## 2021-06-02 DIAGNOSIS — S42101D Fracture of unspecified part of scapula, right shoulder, subsequent encounter for fracture with routine healing: Secondary | ICD-10-CM | POA: Diagnosis not present

## 2021-06-02 DIAGNOSIS — Z9181 History of falling: Secondary | ICD-10-CM

## 2021-06-02 DIAGNOSIS — D631 Anemia in chronic kidney disease: Secondary | ICD-10-CM

## 2021-06-02 DIAGNOSIS — I272 Pulmonary hypertension, unspecified: Secondary | ICD-10-CM

## 2021-06-02 NOTE — Chronic Care Management (AMB) (Signed)
  Chronic Care Management   Outreach Note  06/02/2021 Name: Katelyn Jackson MRN: [id number] DOB: Jul 28, 1946  Referred by: Cassandria Anger, MD Reason for referral : No chief complaint on file.   An unsuccessful telephone outreach was attempted today. The patient was referred to the pharmacist for assistance with care management and care coordination.   Follow Up Plan:   Lauretta Grill Upstream Scheduler

## 2021-06-03 ENCOUNTER — Telehealth: Payer: Self-pay | Admitting: *Deleted

## 2021-06-03 DIAGNOSIS — C50412 Malignant neoplasm of upper-outer quadrant of left female breast: Secondary | ICD-10-CM | POA: Diagnosis not present

## 2021-06-03 DIAGNOSIS — I272 Pulmonary hypertension, unspecified: Secondary | ICD-10-CM | POA: Diagnosis not present

## 2021-06-03 DIAGNOSIS — S42101D Fracture of unspecified part of scapula, right shoulder, subsequent encounter for fracture with routine healing: Secondary | ICD-10-CM | POA: Diagnosis not present

## 2021-06-03 DIAGNOSIS — I130 Hypertensive heart and chronic kidney disease with heart failure and stage 1 through stage 4 chronic kidney disease, or unspecified chronic kidney disease: Secondary | ICD-10-CM | POA: Diagnosis not present

## 2021-06-03 DIAGNOSIS — I5023 Acute on chronic systolic (congestive) heart failure: Secondary | ICD-10-CM | POA: Diagnosis not present

## 2021-06-03 DIAGNOSIS — D62 Acute posthemorrhagic anemia: Secondary | ICD-10-CM | POA: Diagnosis not present

## 2021-06-03 DIAGNOSIS — N184 Chronic kidney disease, stage 4 (severe): Secondary | ICD-10-CM | POA: Diagnosis not present

## 2021-06-03 DIAGNOSIS — S42201D Unspecified fracture of upper end of right humerus, subsequent encounter for fracture with routine healing: Secondary | ICD-10-CM | POA: Diagnosis not present

## 2021-06-03 DIAGNOSIS — D631 Anemia in chronic kidney disease: Secondary | ICD-10-CM | POA: Diagnosis not present

## 2021-06-03 NOTE — Telephone Encounter (Signed)
Faxed orders back to [facility].Marland KitchenJohny Chess

## 2021-06-03 NOTE — Telephone Encounter (Signed)
-----   Message from Eather Colas sent at 05/27/2021  4:16 PM EDT ----- [name] orders

## 2021-06-03 NOTE — Telephone Encounter (Signed)
Rec'd call from therapist he states he pt the pt home to do PT. Starting off pt BP was 120/75 HR 65. After during exercise pt was going to the bathroom, and she states she's having pain over her pacemaker. Felt more like a squeezing pain over the pace maker. Denies any other sxs. He states he rechecked BP 140/82, and she stated she feel fine, but she never had that pain over the pacemaker before. Just needing to inform MD to get any recommendation. Gave information to Dr. [name] he stated pain may be from the scar tissue, and since she had PT may have moved wrong. Advised for pt to take Tylenol 500 mg 1-2 Q 4-6 hours as need. Inform Don w/MD response. Also inform him if pt persists we advise pt to go to ER. Don gave pt instructions while over the phone. Pt states she felt fine.Marland KitchenJohny Chess

## 2021-06-03 NOTE — Telephone Encounter (Signed)
Noted. Agree Thx! 

## 2021-06-07 DIAGNOSIS — D631 Anemia in chronic kidney disease: Secondary | ICD-10-CM | POA: Diagnosis not present

## 2021-06-07 DIAGNOSIS — N184 Chronic kidney disease, stage 4 (severe): Secondary | ICD-10-CM | POA: Diagnosis not present

## 2021-06-07 DIAGNOSIS — I5023 Acute on chronic systolic (congestive) heart failure: Secondary | ICD-10-CM | POA: Diagnosis not present

## 2021-06-07 DIAGNOSIS — S42101D Fracture of unspecified part of scapula, right shoulder, subsequent encounter for fracture with routine healing: Secondary | ICD-10-CM | POA: Diagnosis not present

## 2021-06-07 DIAGNOSIS — I272 Pulmonary hypertension, unspecified: Secondary | ICD-10-CM | POA: Diagnosis not present

## 2021-06-07 DIAGNOSIS — D62 Acute posthemorrhagic anemia: Secondary | ICD-10-CM | POA: Diagnosis not present

## 2021-06-07 DIAGNOSIS — C50412 Malignant neoplasm of upper-outer quadrant of left female breast: Secondary | ICD-10-CM | POA: Diagnosis not present

## 2021-06-07 DIAGNOSIS — I130 Hypertensive heart and chronic kidney disease with heart failure and stage 1 through stage 4 chronic kidney disease, or unspecified chronic kidney disease: Secondary | ICD-10-CM | POA: Diagnosis not present

## 2021-06-07 DIAGNOSIS — S42201D Unspecified fracture of upper end of right humerus, subsequent encounter for fracture with routine healing: Secondary | ICD-10-CM | POA: Diagnosis not present

## 2021-06-09 NOTE — Progress Notes (Signed)
Medical screening examination/treatment/procedure(s) were performed by non-physician practitioner and as supervising physician I was immediately available for consultation/collaboration. I agree with above. Arlo Butt, MD   

## 2021-06-10 ENCOUNTER — Other Ambulatory Visit: Payer: Self-pay

## 2021-06-10 ENCOUNTER — Inpatient Hospital Stay: Payer: Medicare HMO | Admitting: Nurse Practitioner

## 2021-06-10 ENCOUNTER — Telehealth: Payer: Self-pay | Admitting: Hematology

## 2021-06-10 ENCOUNTER — Inpatient Hospital Stay: Payer: Medicare HMO | Attending: Hematology | Admitting: Hematology

## 2021-06-10 VITALS — BP 138/75 | HR 69 | Temp 97.8°F | Resp 18 | Ht 61.0 in

## 2021-06-10 DIAGNOSIS — E669 Obesity, unspecified: Secondary | ICD-10-CM | POA: Insufficient documentation

## 2021-06-10 DIAGNOSIS — Z923 Personal history of irradiation: Secondary | ICD-10-CM | POA: Insufficient documentation

## 2021-06-10 DIAGNOSIS — Z880 Allergy status to penicillin: Secondary | ICD-10-CM | POA: Insufficient documentation

## 2021-06-10 DIAGNOSIS — Z7901 Long term (current) use of anticoagulants: Secondary | ICD-10-CM | POA: Insufficient documentation

## 2021-06-10 DIAGNOSIS — M810 Age-related osteoporosis without current pathological fracture: Secondary | ICD-10-CM | POA: Insufficient documentation

## 2021-06-10 DIAGNOSIS — C50412 Malignant neoplasm of upper-outer quadrant of left female breast: Secondary | ICD-10-CM | POA: Insufficient documentation

## 2021-06-10 DIAGNOSIS — E785 Hyperlipidemia, unspecified: Secondary | ICD-10-CM | POA: Insufficient documentation

## 2021-06-10 DIAGNOSIS — E039 Hypothyroidism, unspecified: Secondary | ICD-10-CM | POA: Insufficient documentation

## 2021-06-10 DIAGNOSIS — M1990 Unspecified osteoarthritis, unspecified site: Secondary | ICD-10-CM | POA: Diagnosis not present

## 2021-06-10 DIAGNOSIS — Z9049 Acquired absence of other specified parts of digestive tract: Secondary | ICD-10-CM | POA: Diagnosis not present

## 2021-06-10 DIAGNOSIS — D631 Anemia in chronic kidney disease: Secondary | ICD-10-CM | POA: Insufficient documentation

## 2021-06-10 DIAGNOSIS — I129 Hypertensive chronic kidney disease with stage 1 through stage 4 chronic kidney disease, or unspecified chronic kidney disease: Secondary | ICD-10-CM | POA: Diagnosis not present

## 2021-06-10 DIAGNOSIS — Z79899 Other long term (current) drug therapy: Secondary | ICD-10-CM | POA: Diagnosis not present

## 2021-06-10 DIAGNOSIS — Z170 Estrogen receptor positive status [ER+]: Secondary | ICD-10-CM

## 2021-06-10 DIAGNOSIS — M25569 Pain in unspecified knee: Secondary | ICD-10-CM | POA: Insufficient documentation

## 2021-06-10 DIAGNOSIS — E611 Iron deficiency: Secondary | ICD-10-CM | POA: Insufficient documentation

## 2021-06-10 DIAGNOSIS — I272 Pulmonary hypertension, unspecified: Secondary | ICD-10-CM | POA: Insufficient documentation

## 2021-06-10 DIAGNOSIS — I890 Lymphedema, not elsewhere classified: Secondary | ICD-10-CM | POA: Diagnosis not present

## 2021-06-10 DIAGNOSIS — I482 Chronic atrial fibrillation, unspecified: Secondary | ICD-10-CM | POA: Diagnosis not present

## 2021-06-10 DIAGNOSIS — Z79811 Long term (current) use of aromatase inhibitors: Secondary | ICD-10-CM | POA: Insufficient documentation

## 2021-06-10 DIAGNOSIS — D638 Anemia in other chronic diseases classified elsewhere: Secondary | ICD-10-CM

## 2021-06-10 DIAGNOSIS — N189 Chronic kidney disease, unspecified: Secondary | ICD-10-CM | POA: Insufficient documentation

## 2021-06-10 DIAGNOSIS — R600 Localized edema: Secondary | ICD-10-CM | POA: Diagnosis not present

## 2021-06-10 LAB — CBC WITH DIFFERENTIAL (CANCER CENTER ONLY)
Abs Immature Granulocytes: 0.01 10*3/uL (ref 0.00–0.07)
Basophils Absolute: 0.1 10*3/uL (ref 0.0–0.1)
Basophils Relative: 1 %
Eosinophils Absolute: 0.3 10*3/uL (ref 0.0–0.5)
Eosinophils Relative: 7 %
HCT: 33 % — ABNORMAL LOW (ref 36.0–46.0)
Hemoglobin: 10.2 g/dL — ABNORMAL LOW (ref 12.0–15.0)
Immature Granulocytes: 0 %
Lymphocytes Relative: 21 %
Lymphs Abs: 0.9 10*3/uL (ref 0.7–4.0)
MCH: 29.6 pg (ref 26.0–34.0)
MCHC: 30.9 g/dL (ref 30.0–36.0)
MCV: 95.7 fL (ref 80.0–100.0)
Monocytes Absolute: 0.6 10*3/uL (ref 0.1–1.0)
Monocytes Relative: 14 %
Neutro Abs: 2.5 10*3/uL (ref 1.7–7.7)
Neutrophils Relative %: 57 %
Platelet Count: 135 10*3/uL — ABNORMAL LOW (ref 150–400)
RBC: 3.45 MIL/uL — ABNORMAL LOW (ref 3.87–5.11)
RDW: 18 % — ABNORMAL HIGH (ref 11.5–15.5)
WBC Count: 4.5 10*3/uL (ref 4.0–10.5)
nRBC: 0 % (ref 0.0–0.2)

## 2021-06-10 LAB — FERRITIN: Ferritin: 167 ng/mL (ref 11–307)

## 2021-06-10 LAB — COMPREHENSIVE METABOLIC PANEL
ALT: 15 U/L (ref 0–44)
AST: 16 U/L (ref 15–41)
Albumin: 3.5 g/dL (ref 3.5–5.0)
Alkaline Phosphatase: 293 U/L — ABNORMAL HIGH (ref 38–126)
Anion gap: 11 (ref 5–15)
BUN: 30 mg/dL — ABNORMAL HIGH (ref 8–23)
CO2: 29 mmol/L (ref 22–32)
Calcium: 10.5 mg/dL — ABNORMAL HIGH (ref 8.9–10.3)
Chloride: 100 mmol/L (ref 98–111)
Creatinine, Ser: 1.85 mg/dL — ABNORMAL HIGH (ref 0.44–1.00)
GFR, Estimated: 28 mL/min — ABNORMAL LOW (ref >60)
Glucose, Bld: 95 mg/dL (ref 70–99)
Potassium: 3.3 mmol/L — ABNORMAL LOW (ref 3.5–5.1)
Sodium: 140 mmol/L (ref 135–145)
Total Bilirubin: 1.5 mg/dL — ABNORMAL HIGH (ref 0.3–1.2)
Total Protein: 7.6 g/dL (ref 6.5–8.1)

## 2021-06-10 LAB — RETICULOCYTES
Immature Retic Fract: 18.5 % — ABNORMAL HIGH (ref 2.3–15.9)
RBC.: 3.43 MIL/uL — ABNORMAL LOW (ref 3.87–5.11)
Retic Count, Absolute: 46 10*3/uL (ref 19.0–186.0)
Retic Ct Pct: 1.3 % (ref 0.4–3.1)

## 2021-06-10 LAB — IRON AND TIBC
Iron: 41 ug/dL (ref 41–142)
Saturation Ratios: 12 % — ABNORMAL LOW (ref 21–57)
TIBC: 346 ug/dL (ref 236–444)
UIBC: 306 ug/dL (ref 120–384)

## 2021-06-10 MED ORDER — TAMOXIFEN CITRATE 20 MG PO TABS
20.0000 mg | ORAL_TABLET | Freq: Every day | ORAL | 5 refills | Status: DC
Start: 2021-06-10 — End: 2021-07-30

## 2021-06-10 NOTE — Telephone Encounter (Signed)
Scheduled per los. Gave avs and calendar  

## 2021-06-10 NOTE — Progress Notes (Signed)
[place]   Telephone:([phone] Fax:([phone]) [phone]   Clinic Follow up Note   Patient Care Team: Plotnikov, [name], MD as PCP - General (Internal Medicine) [name], MD as Consulting Physician (General Surgery) Truitt Merle, MD as Consulting Physician (Hematology) Kyung Rudd, MD as Consulting Physician (Radiation Oncology) Estanislado Emms, MD (Inactive) as Consulting Physician (Nephrology) Lorretta Harp, MD as Consulting Physician (Cardiology) Verner Chol, MD as Consulting Physician (Sports Medicine) Delice Bison, Charlestine Massed, NP as Nurse Practitioner (Hematology and Oncology) [facility] as Consulting Physician (Ophthalmology)  Date of Service:  06/10/2021  CHIEF COMPLAINT: f/u of left breast cancer  SUMMARY OF ONCOLOGIC HISTORY: Oncology History Overview Note  Cancer Staging Malignant neoplasm of upper-outer quadrant of left breast in female, estrogen receptor positive ([place]) Staging form: Breast, AJCC 8th Edition - Clinical stage from 02/06/2017: Stage IA (cT1b(m), cN0, cM0, G1, ER: Positive, PR: Positive, HER2: Negative) - Signed by Truitt Merle, MD on 02/14/2017 - Pathologic stage from 04/04/2017: Stage IA (pT1b(m), pN0, cM0, G2, ER: Positive, PR: Positive, HER2: Negative, Oncotype DX score: 18) - Signed by Truitt Merle, MD on 06/30/2017     Malignant neoplasm of upper-outer quadrant of left breast in female, estrogen receptor positive ([place])  01/26/2017 Mammogram   Screening mammogram on 01/26/17 showed possible asymmetries in the left breast.     02/02/2017 Mammogram   Diagnostic mammogram and [place]: 1. An irregular hypoechoic mass in the left breast at 1 o'clock 7 cm from the nipple measuring 6 x 3 x 4 mm.  2. There is an irregular hypoechoic mass in left breast at 3 o'clock 7 cm from the nipple measuring 4 x 4 x 3 mm.  3. There is a subtle hypoechoic mass in the left breast at 2:30 5 cm from the nipple measuring 3 x 3 x 3 mm 4.  There are multiple other small cysts in the left breast  5. Left axilla (-) on [place]     02/06/2017 Initial Biopsy   Diagnosis 1. Breast, left, needle core biopsy, 1:00 o'clock, 7 CMFN - INVASIVE DUCTAL CARCINOMA, SEE COMMENT. - DUCTAL CARCINOMA IN SITU. 2. Breast, left, needle core biopsy, 3:00 o'clock, 7 CMFN - INVASIVE DUCTAL CARCINOMA, SEE COMMENT. - DUCTAL CARCINOMA IN SITU. Microscopic Comment 1. The carcinoma in parts #1 and 2 appear morphologically similar and grade 1.    02/06/2017 Receptors her2   Both biopsy ER 100%+, PR 100%+, HER2-, Ki67 10%    02/06/2017 Initial Diagnosis   Malignant neoplasm of upper-outer quadrant of left breast in female, estrogen receptor positive ([place])    04/04/2017 Surgery   Left breast lumpectomy and SLN biopsy     04/04/2017 Pathology Results   Diagnosis 1. Breast, lumpectomy, Left - MULTIFOCAL INVASIVE DUCTAL CARCINOMA, NOTTINGHAM GRADE 2 OF 3, 0.9 CM - DUCTAL CARCINOMA IN SITU, LOW GRADE (<1MM FROM MEDIAL MARGIN) - FIBROCYSTIC AND COLUMNAR CELL CHANGE - CALCIFICATIONS ASSOCIATED WITH CARCINOMA AND BENIGN DISEASE - MARGINS UNINVOLVED BY CARCINOMA - PREVIOUS BIOPSY SITE CHANGES - SEE ONCOLOGY TABLE BELOW 2. Lymph node, sentinel, biopsy, Left Axillary #1 - NO CARCINOMA IDENTIFIED IN ONE LYMPH NODE (0/1) 3. Lymph node, sentinel, biopsy, Left Axillary #2 - NO CARCINOMA IDENTIFIED IN ONE LYMPH NODE (0/1)    04/04/2017 Oncotype testing   RS 18, which predicts 10-year distant recurrence risk of 12% with tamoxifen     04/26/2017 Surgery   Re-excision for close margin, final pathyology was negative for cancer  06/06/2017 - 07/19/2017 Radiation Therapy   The Left breast was treated to 42.5 Gy in 17 fractions at 2.5 Gy per fraction. 2. The Left breast was boosted to 7.5 Gy in 3 fractions at 2.5 Gy per fraction.      06/18/2017 - 06/23/2017 Hospital Admission   Admit date: 06/18/17 Admission diagnosis: Intractable lower back pain- sciatica   Additional comments: Sent home with course of steroids and referral for f/u for back injections. Did have AKI w/ CR at 2/0, up from her baseline of 1.6. This slowed with IVF. Hypokalemia improved with K replacement. Radiotherapy was stopped at that time as she was unable to lay on her back due to pain.     08/2017 -  Anti-estrogen oral therapy   Exemestane 25 mg dialy starting 08/2017     08/28/2017 Imaging   BONE DENSITY SCAN  ASSESSMENT: The BMD measured at Femur Neck Left is 0.848 g/cm2 with a T-score of -1.4.   DualFemur Neck Left 08/28/2017    71.7         -1.4    0.848 g/cm2  AP Spine  L1-L3     08/28/2017    71.7         -1.1    1.052 g/cm2    01/29/2018 Mammogram   IMPRESSION: 1. No mammographic evidence of breast malignancy. 2. Surgical and radiation changes within the LEFT breast.       CURRENT THERAPY:  Exemestane 25 mg daily starting 08/2017, will switch to Tamoxifen in August 2022  INTERVAL HISTORY:  Katelyn Jackson is here for a follow up of breast cancer. She was last seen by me on 06/10/20. She presents to the clinic accompanied her son, who lives with her. He notes she had a pacemaker placed in 12/2020. She also had right shoulder surgery in 03/2021. She denies any issues from exemestane.  She does have continued lymphedema in her bilateral lower extremities. She continues to use a massage machine. She is not able to walk around much, but her son notes she can walk to the bathroom. He reports she previously fell a lot, which is how she hurt her shoulder.  Since she had her pacemaker placed, she has not fallen.   All other systems were reviewed with the patient and are negative.  MEDICAL HISTORY:  Past Medical History:  Diagnosis Date   Arthritis    Cancer [facility])    breast cancer   Chronic atrial fibrillation (HCC)    Chronic kidney disease    sees Dr Florene Glen   Cramps, muscle, general    Dyspnea on exertion    Dysrhythmia    a-fib,    GERD  (gastroesophageal reflux disease)    Headache    Hyperlipidemia    Hypertension    Hypothyroidism    Lymphedema    Moderate to severe pulmonary hypertension (HCC)    Obesity    Personal history of radiation therapy    Pneumonia 12/2020   Presence of permanent cardiac pacemaker 01/21/2021   for bradycardia    Syncope 12/2020   needed a pacemaker    SURGICAL HISTORY: Past Surgical History:  Procedure Laterality Date   APPENDECTOMY     BREAST BIOPSY Left 2018   BREAST LUMPECTOMY Left 04/04/2017   x3   BREAST LUMPECTOMY WITH NEEDLE LOCALIZATION AND AXILLARY SENTINEL LYMPH NODE BX Left 04/04/2017   Procedure: BREAST LUMPECTOMY WITH NEEDLE LOCALIZATION x3 AND AXILLARY SENTINEL LYMPH NODE BX;  Surgeon: [name], MD;  [facility]:  [facility];  Service: General;  Laterality: Left;   CATARACT EXTRACTION W/ INTRAOCULAR LENS  IMPLANT, BILATERAL Bilateral 2018   COLONOSCOPY     EYE SURGERY     GLAUCOMA SURGERY Bilateral 2018   INSERT / REPLACE / REMOVE PACEMAKER  01/21/2021   RE-EXCISION OF BREAST CANCER,SUPERIOR MARGINS Left 04/26/2017   Procedure: RE-EXCISION OF LEFT BREAST CANCER;  Surgeon: [name], MD;  Location: [facility];  Service: General;  Laterality: Left;   SHOULDER HEMI-ARTHROPLASTY Right 03/17/2021   Procedure: SHOULDER HEMI-ARTHROPLASTY;  Surgeon: Hiram Gash, MD;  Location: [facility];  Service: Orthopedics;  Laterality: Right;    I have reviewed the social history and family history with the patient and they are unchanged from previous note.  ALLERGIES:  is allergic to chlorhexidine gluconate and penicillins.  MEDICATIONS:  Current Outpatient Medications  Medication Sig Dispense Refill   allopurinol (ZYLOPRIM) 100 MG tablet Take 0.5 tablets (50 mg total) by mouth daily. 45 tablet 3   carvedilol (COREG) 25 MG tablet Take 1 tablet (25 mg total) by mouth 2 (two) times daily. (Patient taking differently: Take 50 mg by mouth 2 (two) times daily.) 180 tablet 3   Cholecalciferol  (VITAMIN D3) 2000 units capsule Take 1 capsule (2,000 Units total) by mouth daily. 100 capsule 3   Cyanocobalamin (VITAMIN B-12 PO) Take 2,000 mcg by mouth daily.     diclofenac Sodium (VOLTAREN) 1 % GEL APPLY 4 GRAMS FOUR TIMES DAILY AS NEEDED (Patient taking differently: Apply 4 g topically 4 (four) times daily as needed (pain). APPLY 4 GRAMS FOUR TIMES DAILY AS NEEDED) 100 g 5   exemestane (AROMASIN) 25 MG tablet Take 1 tablet (25 mg total) by mouth daily after breakfast. 90 tablet 3   levothyroxine (SYNTHROID) 50 MCG tablet Take 1 tablet (50 mcg total) by mouth daily before breakfast. Overdue for Annual appt must see provider for future refills 90 tablet 3   lipase/protease/amylase (CREON) 12000-38000 units CPEP capsule Take 1 capsule (12,000 Units total) by mouth daily as needed (stomach problems). 270 capsule 3   lovastatin (MEVACOR) 20 MG tablet Take 1 tablet (20 mg total) by mouth daily. 90 tablet 3   magnesium oxide (MAG-OX) 400 MG tablet Take 400 mg by mouth 2 (two) times daily.     methocarbamol (ROBAXIN) 500 MG tablet Take 1 tablet (500 mg total) by mouth every 8 (eight) hours as needed for muscle spasms. 20 tablet 0   omeprazole (PRILOSEC) 40 MG capsule Take 1 capsule (40 mg total) by mouth daily. Overdue for Annual appt must see provider for future refills (Patient taking differently: Take 40 mg by mouth daily as needed (acid reflux). Overdue for Annual appt must see provider for future refills) 90 capsule 3   OVER THE COUNTER MEDICATION Apply 1 application topically daily as needed (apply to buttocks  as needed for irritaiton). Intensive Skin Care Therapy     polyethylene glycol powder (GLYCOLAX/MIRALAX) powder mix 1 capful (17 grams) IN 8 ounces OF liquid EVERY DAY (Patient taking differently: Take 17 g by mouth daily. mix 1 capful (17 grams) IN 8 ounces OF liquid EVERY DAY) 1581 g 3   potassium chloride (KLOR-CON) 8 MEQ tablet Take 1 tablet (8 mEq total) by mouth daily. 90 tablet 3    tamoxifen (NOLVADEX) 20 MG tablet Take 1 tablet (20 mg total) by mouth daily. 30 tablet 5   torsemide (DEMADEX) 20 MG tablet Take 60 mg every morning and 40 mg in the pm (2  pm) 450 tablet 3   traMADol (ULTRAM) 50 MG tablet Take 1 tablet (50 mg total) by mouth every 8 (eight) hours as needed for severe pain. 30 tablet 0   warfarin (COUMADIN) 5 MG tablet Take 1 tablet daily except take 1/2 tablet on Wed and Sat or Take as directed by anticoagulation clinic (Patient taking differently: Take 2.5 mg by mouth daily. Take as directed by anticoagulation clinic) 90 tablet 1   No current facility-administered medications for this visit.    PHYSICAL EXAMINATION: ECOG PERFORMANCE STATUS: 3 - Symptomatic, >50% confined to bed  Vitals:   06/10/21 0930  BP: 138/75  Pulse: 69  Resp: 18  Temp: 97.8 F (36.6 C)  SpO2: 98%   Wt Readings from Last 3 Encounters:  05/11/21 250 lb (113.4 kg)  04/29/21 250 lb (113.4 kg)  03/17/21 253 lb (114.8 kg)     GENERAL:alert, no distress and comfortable SKIN: skin color, texture, turgor are normal, no rashes or significant lesions EYES: normal, Conjunctiva are pink and non-injected, sclera clear  NECK: supple, thyroid normal size, non-tender, without nodularity LYMPH:  no palpable lymphadenopathy in the cervical, axillary  LUNGS: clear to auscultation and percussion with normal breathing effort HEART: regular rate & rhythm and no murmurs and no lower extremity edema ABDOMEN:abdomen soft, non-tender and normal bowel sounds Musculoskeletal:no cyanosis of digits and no clubbing  NEURO: alert & oriented x 3 with fluent speech, no focal motor/sensory deficits BREAST: mild lymphedema to left breast; No palpable mass, nodules or adenopathy bilaterally. Breast exam benign.   LABORATORY DATA:  I have reviewed the data as listed CBC Latest Ref Rng & Units 06/10/2021 03/20/2021 03/19/2021  WBC 4.0 - 10.5 K/uL 4.5 7.1 9.0  Hemoglobin 12.0 - 15.0 g/dL 10.2(L) 9.1(L) 8.0(L)   Hematocrit 36.0 - 46.0 % 33.0(L) 31.2(L) 27.2(L)  Platelets 150 - 400 K/uL 135(L) 182 152     CMP Latest Ref Rng & Units 06/10/2021 03/20/2021 03/19/2021  Glucose 70 - 99 mg/dL 95 86 117(H)  BUN 8 - 23 mg/dL 30(H) 22 22  Creatinine 0.44 - 1.00 mg/dL 1.85(H) 1.59(H) 1.84(H)  Sodium 135 - 145 mmol/L 140 137 137  Potassium 3.5 - 5.1 mmol/L 3.3(L) 3.8 4.0  Chloride 98 - 111 mmol/L 100 101 100  CO2 22 - 32 mmol/L _0 Calcium 8.9 - 10.3 mg/dL 10.5(H) 10.0 9.4  Total Protein 6.5 - 8.1 g/dL 7.6 - -  Total Bilirubin 0.3 - 1.2 mg/dL 1.5(H) - -  Alkaline Phos 38 - 126 U/L 293(H) - -  AST 15 - 41 U/L 16 - -  ALT 0 - 44 U/L 15 - -      RADIOGRAPHIC STUDIES: I have personally reviewed the radiological images as listed and agreed with the findings in the report. No results found.   ASSESSMENT & PLAN:  Katelyn Jackson is a 75 y.o. female with   1. Malignant neoplasm of upper-outer quadrant of left breast, invasive ductal carcinoma,  stage IA (pT1b(m)N0), grade 2, ER+,PR+,HER2-, oncotype RS 18 -She was diagnosed in 02/2017. She is S/p left lumpectomy on 04/04/2017 and re-excision of close margin on 6/202/018, she has had complete surgical resection, surgical margins were negative. Lymph nodes were negative. -Given her relatively low score in the intermedia group, I did not recommend adjuvant chemotherapy. Patient agreed -S/p adjuvant radiation 06/06/2017 to 07/19/2017 with Dr. Lisbeth Renshaw and tolerated well. -She started Exemestane in 08/2017. Given osteoporosis on her 08/2020 DEXA, we will switch her to tamoxifen.  --  The potential side effects, which includes but not limited to, hot flash, skin and vaginal dryness, slightly increased risk of cardiovascular disease and cataract, small risk of thrombosis and endometrial cancer, were discussed with her in great details. Preventive strategies for thrombosis, such as being physically active, using compression stocks, avoid cigarette smoking, etc., were  reviewed with her.  She is on Coumadin, which will likely reduce her risk of thrombosis. -I advised her to complete her current refill of exemestane and then switch to tamoxifen. I will call her son in 6 months to see how she is doing on the tamoxifen. -From a breast cancer standpoint, she is doing well. Physical exam unremarkable. Her most recent screening mammogram from 05/25/21 was negative. There is no concern for cancer recurrence.    2. Chronic atrial fibrillation, hyperlipidemia, HTN, lymphedema, pulmonary hypertension, obesity, hypothyroidism -Managed by her PCP and Cardiologist (Dr. Gwenlyn Found) -On Lasix, Lovastatin, Diovan-HCT, Coumadin 5 mg, and synthroid.   3. Anemia of chronic disease (CKD) and iron deficiency - iron level in December 2018, ferritin was 33, relatively low given her CKD.  -She was on oral ferrous sulfate. She denies taking this today (06/10/21) -Hgb up to 10.2 today (06/10/21). Iron panel is still pending. I will call her with the results. -She received two doses of Feraheme in 04/2021. -CMP pending today. She follows up with nephrologist every 6 months    4. Osteoarthritis, arthralgia -followed by ortho at [facility] -Pain is stable on AI -Her pain arthritis is in her knees. She receives knee injections q76month with ortho   5. Osteoporosis -08/2017 DEXA showed osteopenia (-1.4). Repeat 08/2020 showed borderline osteoporosis (-2.5) -she is taking vit D. She was previously taking calcium, but she notes her nephrologist took her off this due to her CKD. I recommend she follow up with them regarding options for bone health.   6. Bilateral LE lymphedema -She notes her LE edema significantly worsened since 2019. She uses machine to massage her legs BID. -She notes due to knee pain and heaviness of her legs, she does not walk much. -She continues to have significant lymphedema to her bilateral legs.     PLAN: -switch Exemestane to tamoxifen this month, I called in today   -telephone toxicity check in 6 months -next mammogram due 05/2022 -Lab and F/u in 1 year     No problem-specific Assessment & Plan notes found for this encounter.   No orders of the defined types were placed in this encounter.  All questions were answered. The patient knows to call the clinic with any problems, questions or concerns. No barriers to learning was detected. The total time spent in the appointment was 30 minutes.     YTruitt Merle MD 06/10/2021   I, KWilburn Mylar am acting as scribe for YTruitt Merle MD.   I have reviewed the above documentation for accuracy and completeness, and I agree with the above.

## 2021-06-11 ENCOUNTER — Other Ambulatory Visit: Payer: Self-pay | Admitting: Internal Medicine

## 2021-06-11 DIAGNOSIS — I130 Hypertensive heart and chronic kidney disease with heart failure and stage 1 through stage 4 chronic kidney disease, or unspecified chronic kidney disease: Secondary | ICD-10-CM | POA: Diagnosis not present

## 2021-06-11 DIAGNOSIS — D62 Acute posthemorrhagic anemia: Secondary | ICD-10-CM | POA: Diagnosis not present

## 2021-06-11 DIAGNOSIS — D631 Anemia in chronic kidney disease: Secondary | ICD-10-CM | POA: Diagnosis not present

## 2021-06-11 DIAGNOSIS — I272 Pulmonary hypertension, unspecified: Secondary | ICD-10-CM | POA: Diagnosis not present

## 2021-06-11 DIAGNOSIS — S42101D Fracture of unspecified part of scapula, right shoulder, subsequent encounter for fracture with routine healing: Secondary | ICD-10-CM | POA: Diagnosis not present

## 2021-06-11 DIAGNOSIS — N184 Chronic kidney disease, stage 4 (severe): Secondary | ICD-10-CM | POA: Diagnosis not present

## 2021-06-11 DIAGNOSIS — C50412 Malignant neoplasm of upper-outer quadrant of left female breast: Secondary | ICD-10-CM | POA: Diagnosis not present

## 2021-06-11 DIAGNOSIS — S42201D Unspecified fracture of upper end of right humerus, subsequent encounter for fracture with routine healing: Secondary | ICD-10-CM | POA: Diagnosis not present

## 2021-06-11 DIAGNOSIS — I5023 Acute on chronic systolic (congestive) heart failure: Secondary | ICD-10-CM | POA: Diagnosis not present

## 2021-06-16 DIAGNOSIS — R531 Weakness: Secondary | ICD-10-CM | POA: Diagnosis not present

## 2021-06-16 DIAGNOSIS — S42101D Fracture of unspecified part of scapula, right shoulder, subsequent encounter for fracture with routine healing: Secondary | ICD-10-CM | POA: Diagnosis not present

## 2021-06-16 DIAGNOSIS — Z950 Presence of cardiac pacemaker: Secondary | ICD-10-CM | POA: Diagnosis not present

## 2021-06-16 DIAGNOSIS — I130 Hypertensive heart and chronic kidney disease with heart failure and stage 1 through stage 4 chronic kidney disease, or unspecified chronic kidney disease: Secondary | ICD-10-CM | POA: Diagnosis not present

## 2021-06-16 DIAGNOSIS — D62 Acute posthemorrhagic anemia: Secondary | ICD-10-CM | POA: Diagnosis not present

## 2021-06-16 DIAGNOSIS — C50412 Malignant neoplasm of upper-outer quadrant of left female breast: Secondary | ICD-10-CM | POA: Diagnosis not present

## 2021-06-16 DIAGNOSIS — I5023 Acute on chronic systolic (congestive) heart failure: Secondary | ICD-10-CM | POA: Diagnosis not present

## 2021-06-16 DIAGNOSIS — S42201D Unspecified fracture of upper end of right humerus, subsequent encounter for fracture with routine healing: Secondary | ICD-10-CM | POA: Diagnosis not present

## 2021-06-16 DIAGNOSIS — I272 Pulmonary hypertension, unspecified: Secondary | ICD-10-CM | POA: Diagnosis not present

## 2021-06-16 DIAGNOSIS — N184 Chronic kidney disease, stage 4 (severe): Secondary | ICD-10-CM | POA: Diagnosis not present

## 2021-06-16 DIAGNOSIS — D631 Anemia in chronic kidney disease: Secondary | ICD-10-CM | POA: Diagnosis not present

## 2021-06-16 NOTE — Progress Notes (Signed)
I called Ms. Goding about her recent labs and spoke to her son who translated. CMP shows K 3.3, elevated Scr, alk phos, Tbili 1.5, and calcium 10.5. she feels well, denies RUQ pain or signs of juandice. I recommend to hold calcium supplement, increase oral K by 1 tab daily x1 week then return to normal dose, and hydrate. He will watch for pain and juandice. She has nephrology and PCP visits coming up. Will ask PCP to repeat CMP to make sure abnormalities resolve. Son understands, agrees, and appreciates the call.   Cira Rue, NP

## 2021-06-17 ENCOUNTER — Telehealth: Payer: Self-pay | Admitting: Internal Medicine

## 2021-06-17 NOTE — Chronic Care Management (AMB) (Signed)
  Chronic Care Management   Note  06/17/2021 Name: Katelyn Jackson MRN: [id number] DOB: Mar 11, 1946  [name] is a 75 y.o. year old female who is a primary care patient of Plotnikov, [name], MD. I reached out to [name] by phone today in response to a referral sent by [name] PCP, Plotnikov, [name], MD.   [name] was given information about Chronic Care Management services today including:  CCM service includes personalized support from designated clinical staff supervised by her physician, including individualized plan of care and coordination with other care providers 24/7 contact phone numbers for assistance for urgent and routine care needs. Service will only be billed when office clinical staff spend 20 minutes or more in a month to coordinate care. Only one practitioner may furnish and bill the service in a calendar month. The patient may stop CCM services at any time (effective at the end of the month) by phone call to the office staff.   Patient agreed to services and verbal consent obtained.   Follow up plan:   [name]

## 2021-06-18 DIAGNOSIS — N184 Chronic kidney disease, stage 4 (severe): Secondary | ICD-10-CM | POA: Diagnosis not present

## 2021-06-18 DIAGNOSIS — M19011 Primary osteoarthritis, right shoulder: Secondary | ICD-10-CM | POA: Diagnosis not present

## 2021-06-18 DIAGNOSIS — M25562 Pain in left knee: Secondary | ICD-10-CM | POA: Diagnosis not present

## 2021-06-18 DIAGNOSIS — M25561 Pain in right knee: Secondary | ICD-10-CM | POA: Diagnosis not present

## 2021-06-23 DIAGNOSIS — I5023 Acute on chronic systolic (congestive) heart failure: Secondary | ICD-10-CM | POA: Diagnosis not present

## 2021-06-23 DIAGNOSIS — S42101D Fracture of unspecified part of scapula, right shoulder, subsequent encounter for fracture with routine healing: Secondary | ICD-10-CM | POA: Diagnosis not present

## 2021-06-23 DIAGNOSIS — C50412 Malignant neoplasm of upper-outer quadrant of left female breast: Secondary | ICD-10-CM | POA: Diagnosis not present

## 2021-06-23 DIAGNOSIS — S42201D Unspecified fracture of upper end of right humerus, subsequent encounter for fracture with routine healing: Secondary | ICD-10-CM | POA: Diagnosis not present

## 2021-06-23 DIAGNOSIS — I272 Pulmonary hypertension, unspecified: Secondary | ICD-10-CM | POA: Diagnosis not present

## 2021-06-23 DIAGNOSIS — D62 Acute posthemorrhagic anemia: Secondary | ICD-10-CM | POA: Diagnosis not present

## 2021-06-23 DIAGNOSIS — N184 Chronic kidney disease, stage 4 (severe): Secondary | ICD-10-CM | POA: Diagnosis not present

## 2021-06-23 DIAGNOSIS — D631 Anemia in chronic kidney disease: Secondary | ICD-10-CM | POA: Diagnosis not present

## 2021-06-23 DIAGNOSIS — I130 Hypertensive heart and chronic kidney disease with heart failure and stage 1 through stage 4 chronic kidney disease, or unspecified chronic kidney disease: Secondary | ICD-10-CM | POA: Diagnosis not present

## 2021-06-25 DIAGNOSIS — D649 Anemia, unspecified: Secondary | ICD-10-CM | POA: Diagnosis not present

## 2021-06-25 DIAGNOSIS — L0390 Cellulitis, unspecified: Secondary | ICD-10-CM | POA: Diagnosis not present

## 2021-06-25 DIAGNOSIS — R600 Localized edema: Secondary | ICD-10-CM | POA: Diagnosis not present

## 2021-06-25 DIAGNOSIS — M109 Gout, unspecified: Secondary | ICD-10-CM | POA: Diagnosis not present

## 2021-06-25 DIAGNOSIS — I129 Hypertensive chronic kidney disease with stage 1 through stage 4 chronic kidney disease, or unspecified chronic kidney disease: Secondary | ICD-10-CM | POA: Diagnosis not present

## 2021-06-25 DIAGNOSIS — Z6841 Body Mass Index (BMI) 40.0 and over, adult: Secondary | ICD-10-CM | POA: Diagnosis not present

## 2021-06-25 DIAGNOSIS — N184 Chronic kidney disease, stage 4 (severe): Secondary | ICD-10-CM | POA: Diagnosis not present

## 2021-06-28 ENCOUNTER — Encounter (HOSPITAL_BASED_OUTPATIENT_CLINIC_OR_DEPARTMENT_OTHER): Payer: Self-pay | Admitting: *Deleted

## 2021-06-28 ENCOUNTER — Emergency Department (HOSPITAL_BASED_OUTPATIENT_CLINIC_OR_DEPARTMENT_OTHER): Payer: Medicare HMO

## 2021-06-28 ENCOUNTER — Emergency Department (HOSPITAL_BASED_OUTPATIENT_CLINIC_OR_DEPARTMENT_OTHER)
Admission: EM | Admit: 2021-06-28 | Discharge: 2021-06-28 | Disposition: A | Discharge status: Home / Self Care | Payer: Medicare HMO | Attending: Emergency Medicine | Admitting: Emergency Medicine

## 2021-06-28 DIAGNOSIS — Z853 Personal history of malignant neoplasm of breast: Secondary | ICD-10-CM | POA: Diagnosis not present

## 2021-06-28 DIAGNOSIS — E039 Hypothyroidism, unspecified: Secondary | ICD-10-CM | POA: Insufficient documentation

## 2021-06-28 DIAGNOSIS — M25551 Pain in right hip: Secondary | ICD-10-CM | POA: Diagnosis not present

## 2021-06-28 DIAGNOSIS — Z79899 Other long term (current) drug therapy: Secondary | ICD-10-CM | POA: Diagnosis not present

## 2021-06-28 DIAGNOSIS — R509 Fever, unspecified: Secondary | ICD-10-CM | POA: Diagnosis not present

## 2021-06-28 DIAGNOSIS — Z7901 Long term (current) use of anticoagulants: Secondary | ICD-10-CM | POA: Diagnosis not present

## 2021-06-28 DIAGNOSIS — M25511 Pain in right shoulder: Secondary | ICD-10-CM

## 2021-06-28 DIAGNOSIS — I129 Hypertensive chronic kidney disease with stage 1 through stage 4 chronic kidney disease, or unspecified chronic kidney disease: Secondary | ICD-10-CM | POA: Diagnosis not present

## 2021-06-28 DIAGNOSIS — Z96611 Presence of right artificial shoulder joint: Secondary | ICD-10-CM | POA: Diagnosis not present

## 2021-06-28 DIAGNOSIS — R1030 Lower abdominal pain, unspecified: Secondary | ICD-10-CM | POA: Diagnosis not present

## 2021-06-28 DIAGNOSIS — N184 Chronic kidney disease, stage 4 (severe): Secondary | ICD-10-CM | POA: Insufficient documentation

## 2021-06-28 DIAGNOSIS — W19XXXA Unspecified fall, initial encounter: Secondary | ICD-10-CM

## 2021-06-28 DIAGNOSIS — M25519 Pain in unspecified shoulder: Secondary | ICD-10-CM | POA: Diagnosis not present

## 2021-06-28 MED ORDER — ACETAMINOPHEN 500 MG PO TABS
1000.0000 mg | ORAL_TABLET | Freq: Once | ORAL | Status: AC
  Administered 2021-06-28: 1000 mg via ORAL
  Filled 2021-06-28: qty 2

## 2021-06-28 NOTE — ED Notes (Signed)
Spoke with pt's son [name] at [employer]. Review findings and f/u instructions. Offered to have [facility]AR take pt. home. [name] states he will come to [facility] and pick pt. Up. Plan reviewed w/ charge RN.

## 2021-06-28 NOTE — ED Triage Notes (Signed)
Arrived ems,  slid out of bed denies inj but wants right shoulder check  hx of shoulder surgery a few months ago,

## 2021-06-28 NOTE — ED Provider Notes (Addendum)
[facility] Provider Note   CSN: [id number] Arrival date & time: 06/28/21  1209     History Chief Complaint  Patient presents with   Lytle Michaels    Katelyn Jackson is a 75 y.o. female.  HPI Patient is a 75 year old female presenting today with complaints of right shoulder pain, some right groin pain.  States that she has no other complaints.  She states that her pain began this morning when she slid out of bed while try to get out of bed.  She states she did not feel short of breath lightheaded or dizzy she states that this occurred because her foot hit the ground and slid instead of catching.  She states she normally gets around with a walker.  She fell to the ground but was able to stand back up with assistance.  She states that she is having pain in her right shoulder which she had surgery on in May of this year.  She states the pain is achy and constant but mild approximately 6/10.  She has not taken any medication prior to arrival.  No other associate symptoms.  I agree mitigating factors.  Denies any chest pain, shortness of breath, lightheadedness, cough, fevers, chills, urinary frequency urgency dysuria hematuria.  She states she did not strike her head or lose consciousness she did not have any headache abrasions bleeding or bruising after the fall.  She states she did not fall but "slid "    Past Medical History:  Diagnosis Date   Arthritis    Cancer ([place])    breast cancer   Chronic atrial fibrillation ([facility])    Chronic kidney disease    sees Dr Florene Glen   Cramps, muscle, general    Dyspnea on exertion    Dysrhythmia    a-fib,    GERD (gastroesophageal reflux disease)    Headache    Hyperlipidemia    Hypertension    Hypothyroidism    Lymphedema    Moderate to severe pulmonary hypertension ([place])    Obesity    Personal history of radiation therapy    Pneumonia 12/2020   Presence of permanent cardiac pacemaker 01/21/2021   for bradycardia     Syncope 12/2020   needed a pacemaker    Patient Active Problem List   Diagnosis Date Noted   Postoperative anemia due to acute blood loss 03/19/2021   Dyspnea 03/19/2021   Symptomatic anemia 03/19/2021   Status post right shoulder hemiarthroplasty 03/17/2021   Leg pain, lateral, left 10/08/2020   Abscess of left thumb 06/17/2020   Cellulitis and abscess of left leg 06/17/2020   Herpes zoster 02/18/2020   GERD (gastroesophageal reflux disease) 11/25/2019   Wheezing 10/26/2017   Sciatica of left side 08/29/2017   Long term (current) use of anticoagulants 08/02/2017   Morbid obesity ([place]) 07/11/2017   Hyperglycemia 07/11/2017   Low back pain 06/19/2017   CRF (chronic renal failure), stage 4 (severe) ([place]) 06/19/2017   Hypokalemia 06/19/2017   Acute lower UTI 06/19/2017   Anemia of chronic disease 02/16/2017   Malignant neoplasm of upper-outer quadrant of left breast in female, estrogen receptor positive ([place]) 02/10/2017   Essential hypertension 04/05/2016   Dyslipidemia 04/05/2016   Chronic atrial fibrillation ([place]) 04/05/2016   Pulmonary hypertension ([place]) 04/05/2016   Lymphedema 04/05/2016    Past Surgical History:  Procedure Laterality Date   APPENDECTOMY     BREAST BIOPSY Left 2018   BREAST LUMPECTOMY Left 04/04/2017   x3  BREAST LUMPECTOMY WITH NEEDLE LOCALIZATION AND AXILLARY SENTINEL LYMPH NODE BX Left 04/04/2017   Procedure: BREAST LUMPECTOMY WITH NEEDLE LOCALIZATION x3 AND AXILLARY SENTINEL LYMPH NODE BX;  Surgeon: [name], MD;  Location: [facility];  Service: General;  Laterality: Left;   CATARACT EXTRACTION W/ INTRAOCULAR LENS  IMPLANT, BILATERAL Bilateral 2018   COLONOSCOPY     EYE SURGERY     GLAUCOMA SURGERY Bilateral 2018   INSERT / REPLACE / REMOVE PACEMAKER  01/21/2021   RE-EXCISION OF BREAST CANCER,SUPERIOR MARGINS Left 04/26/2017   Procedure: RE-EXCISION OF LEFT BREAST CANCER;  Surgeon: [name], MD;  Location: [facility];  Service: General;   Laterality: Left;   SHOULDER HEMI-ARTHROPLASTY Right 03/17/2021   Procedure: SHOULDER HEMI-ARTHROPLASTY;  Surgeon: Hiram Gash, MD;  Location: [facility];  Service: Orthopedics;  Laterality: Right;     OB History   No obstetric history on file.     Family History  Problem Relation Age of Onset   CAD Mother 44   Stroke Mother 76       hemorr CVA   COPD Father 72   Cancer Neg Hx     Social History   Tobacco Use   Smoking status: Never   Smokeless tobacco: Never  Vaping Use   Vaping Use: Never used  Substance Use Topics   Alcohol use: No    Alcohol/week: 0.0 standard drinks   Drug use: No    Home Medications Prior to Admission medications   Medication Sig Start Date End Date Taking? Authorizing Provider  allopurinol (ZYLOPRIM) 100 MG tablet Take 0.5 tablets (50 mg total) by mouth daily. 10/08/20   Plotnikov, [name], MD  carvedilol (COREG) 25 MG tablet Take 1 tablet (25 mg total) by mouth 2 (two) times daily. Patient taking differently: Take 50 mg by mouth 2 (two) times daily. 05/11/21 05/11/22  Plotnikov, [name], MD  Cholecalciferol (VITAMIN D3) 2000 units capsule Take 1 capsule (2,000 Units total) by mouth daily. 07/11/17   Plotnikov, [name], MD  Cyanocobalamin (VITAMIN B-12 PO) Take 2,000 mcg by mouth daily.    [provider]  diclofenac Sodium (VOLTAREN) 1 % GEL APPLY FOUR GRAMS FOUR TIMES DAILY AS NEEDED 06/11/21   Plotnikov, [name], MD  exemestane (AROMASIN) 25 MG tablet Take 1 tablet (25 mg total) by mouth daily after breakfast. 10/08/20   Plotnikov, [name], MD  levothyroxine (SYNTHROID) 50 MCG tablet Take 1 tablet (50 mcg total) by mouth daily before breakfast. Overdue for Annual appt must see provider for future refills 02/09/21   Plotnikov, [name], MD  lipase/protease/amylase (CREON) 12000-38000 units CPEP capsule Take 1 capsule (12,000 Units total) by mouth daily as needed (stomach problems). 10/08/20   Plotnikov, [name], MD  lovastatin (MEVACOR) 20  MG tablet Take 1 tablet (20 mg total) by mouth daily. 10/08/20   Plotnikov, [name], MD  magnesium oxide (MAG-OX) 400 MG tablet Take 400 mg by mouth 2 (two) times daily.    [provider]  methocarbamol (ROBAXIN) 500 MG tablet Take 1 tablet (500 mg total) by mouth every 8 (eight) hours as needed for muscle spasms. 03/19/21   McBane, Maylene Roes, PA-C  omeprazole (PRILOSEC) 40 MG capsule Take 1 capsule (40 mg total) by mouth daily. Overdue for Annual appt must see provider for future refills Patient taking differently: Take 40 mg by mouth daily as needed (acid reflux). Overdue for Annual appt must see provider for future refills 10/08/20   Plotnikov, [name],  MD  OVER THE COUNTER MEDICATION Apply 1 application topically daily as needed (apply to buttocks  as needed for irritaiton). Intensive Skin Care Therapy    [provider]  polyethylene glycol powder (GLYCOLAX/MIRALAX) powder mix 1 capful (17 grams) IN 8 ounces OF liquid EVERY DAY Patient taking differently: Take 17 g by mouth daily. mix 1 capful (17 grams) IN 8 ounces OF liquid EVERY DAY 11/05/18   Plotnikov, [name], MD  potassium chloride (KLOR-CON) 8 MEQ tablet Take 1 tablet (8 mEq total) by mouth daily. 10/08/20   Plotnikov, [name], MD  tamoxifen (NOLVADEX) 20 MG tablet Take 1 tablet (20 mg total) by mouth daily. 06/10/21   Truitt Merle, MD  torsemide (DEMADEX) 20 MG tablet Take 60 mg every morning and 40 mg in the pm (2 pm) 05/11/21   Plotnikov, [name], MD  traMADol (ULTRAM) 50 MG tablet Take 1 tablet (50 mg total) by mouth every 8 (eight) hours as needed for severe pain. 03/19/21   Ethelda Chick, PA-C  warfarin (COUMADIN) 5 MG tablet Take 1 tablet daily except take 1/2 tablet on Wed and Sat or Take as directed by anticoagulation clinic Patient taking differently: Take 2.5 mg by mouth daily. Take as directed by anticoagulation clinic 10/08/20   Plotnikov, [name], MD    Allergies    Chlorhexidine gluconate and  Penicillins  Review of Systems   Review of Systems  Constitutional:  Negative for chills and fever.  HENT:  Negative for congestion.   Eyes:  Negative for pain.  Respiratory:  Negative for cough and shortness of breath.   Cardiovascular:  Negative for chest pain and leg swelling.  Gastrointestinal:  Negative for abdominal pain and vomiting.  Genitourinary:  Negative for decreased urine volume, dysuria, flank pain, frequency, genital sores, hematuria, menstrual problem, urgency and vaginal discharge.  Musculoskeletal:  Positive for myalgias.       Right shoulder, right hip pain  Skin:  Negative for rash.  Neurological:  Negative for dizziness and headaches.   Physical Exam Updated Vital Signs BP 107/80 (BP Location: Left Arm)   Pulse 80   Temp 99.9 F (37.7 C) (Oral)   Resp 15   Ht 5\' 1"  (1.549 m)   Wt 113.4 kg   LMP  (LMP Unknown)   SpO2 98%   BMI 47.24 kg/m   Physical Exam Vitals and nursing note reviewed.  Constitutional:      General: She is not in acute distress.    Appearance: She is obese.     Comments: Pleasant well-appearing 75 year old.  In no acute distress.  Sitting comfortably in bed.  Able answer questions appropriately follow commands. No increased work of breathing. Speaking in full sentences.   HENT:     Head: Normocephalic and atraumatic.     Nose: Nose normal.     Mouth/Throat:     Mouth: Mucous membranes are moist.  Eyes:     General: No scleral icterus. Cardiovascular:     Rate and Rhythm: Normal rate and regular rhythm.     Pulses: Normal pulses.     Heart sounds: Normal heart sounds.  Pulmonary:     Effort: Pulmonary effort is normal. No respiratory distress.     Breath sounds: No wheezing.  Abdominal:     Palpations: Abdomen is soft.     Tenderness: There is no abdominal tenderness. There is no right CVA tenderness, left CVA tenderness, guarding or rebound.  Musculoskeletal:     Cervical  back: Normal range of motion.     Right lower leg:  No edema.     Left lower leg: No edema.     Comments: No bony tenderness over joints or long bones of the upper and lower extremities.    No neck or back midline tenderness, step-off, deformity, or bruising. Able to turn head left and right 45 degrees without difficulty.  Full range of motion of upper and lower extremity joints shown after palpation was conducted; with 5/5 symmetrical strength in upper and lower extremities. No chest wall tenderness, no facial or cranial tenderness.   Patient has intact sensation grossly in lower and upper extremities. Intact patellar and ankle reflexes. Patient able to ambulate without difficulty.  Radial and DP pulses palpated BL.   Skin:    General: Skin is warm and dry.     Capillary Refill: Capillary refill takes less than 2 seconds.  Neurological:     Mental Status: She is alert. Mental status is at baseline.  Psychiatric:        Mood and Affect: Mood normal.        Behavior: Behavior normal.    ED Results / Procedures / Treatments   Labs (all labs ordered are listed, but only abnormal results are displayed) Labs Reviewed - No data to display  EKG None  Radiology DG Shoulder Right  Result Date: 06/28/2021 CLINICAL DATA:  Right shoulder pain after fall today. EXAM: RIGHT SHOULDER - 2+ VIEW COMPARISON:  None. FINDINGS: Right shoulder prosthesis appears to be well situated. No fracture or dislocation is noted. IMPRESSION: Status post right shoulder arthroplasty.  No acute abnormality seen. Electronically Signed   By: Marijo Conception M.D.   On: 06/28/2021 14:11   DG Hip Unilat W or Wo Pelvis 2-3 Views Right  Result Date: 06/28/2021 CLINICAL DATA:  Right hip pain after fall today. EXAM: DG HIP (WITH OR WITHOUT PELVIS) 2-3V RIGHT COMPARISON:  None. FINDINGS: There is no evidence of hip fracture or dislocation. Mild narrowing and osteophyte formation is seen involving the right hip. IMPRESSION: Mild degenerative joint disease of the right hip. No  acute abnormality is noted. Electronically Signed   By: Marijo Conception M.D.   On: 06/28/2021 14:09    Procedures Procedures   Medications Ordered in ED Medications  acetaminophen (TYLENOL) tablet 1,000 mg (1,000 mg Oral Given 06/28/21 1259)    ED Course  I have reviewed the triage vital signs and the nursing notes.  Pertinent labs & imaging results that were available during my care of the patient were reviewed by me and considered in my medical decision making (see chart for details).    MDM Rules/Calculators/A&P                           Patient with mechanical fall that occurred earlier today is declining any additional medical work-up today.  Denies any weakness or fatigue.  States that she has right shoulder and some right hip pain although none of this is palpable on my examination  Will obtain x-rays rule out fracture.  Patient was given Tylenol in the interim she states that she feels much improved after this.  She ambulates with a walker at baseline.  X-ray is unremarkable.  There is some degenerative changes in the right hip x-ray with no fractures.  She states she feels that she is at her baseline ability to move her legs is able to lift both  legs off the bed only slightly because of very large legs that she has.  Discussed with RN given that [facility] ambulance service is likely necessary to get this patient home  Discussed with Dr. Ronnald Nian prior to discharging patient.  He is in agreement with my plan.  Final Clinical Impression(s) / ED Diagnoses Final diagnoses:  Fall, initial encounter  Acute pain of right shoulder  Right hip pain    Rx / DC Orders ED Discharge Orders     None        Tedd Sias, [place] 06/28/21 1738    Tedd Sias, PA 06/28/21 1739    Tedd Sias, PA 06/28/21 [place], [place], DO 06/29/21 [phone]

## 2021-06-28 NOTE — ED Notes (Signed)
Son arrives to ED. Decided he needed assistance getting mother home. [facility] called.

## 2021-06-28 NOTE — ED Notes (Signed)
Transported home via [employer]

## 2021-06-28 NOTE — ED Notes (Signed)
Pt. Non ambulatory and taken to x-ray via stretcher.  Pt. Understands little english ...enough to understand what is being given to her and where she is being transported via stretcher.  Pt. In no distress.

## 2021-06-28 NOTE — Discharge Instructions (Addendum)
Your x-rays were negative for fracture. Please take Tylenol 1000 mg every 6 hours for pain as needed.  Drink plenty of water.  Please follow-up with your primary care provider.

## 2021-06-29 ENCOUNTER — Telehealth: Payer: Self-pay | Admitting: Internal Medicine

## 2021-06-29 NOTE — Telephone Encounter (Signed)
Katelyn Jackson from [facility]   Reporting a fall. Pt's son called yesterday stating the had pt slipped out bed trying to get up. She was taken to ED in [place]. Complained of R shoulder and hip pain but  x-ray showed no fractures in both shoulder and hip.   Best callback #:  [phone]

## 2021-06-30 DIAGNOSIS — D62 Acute posthemorrhagic anemia: Secondary | ICD-10-CM | POA: Diagnosis not present

## 2021-06-30 DIAGNOSIS — S42101D Fracture of unspecified part of scapula, right shoulder, subsequent encounter for fracture with routine healing: Secondary | ICD-10-CM | POA: Diagnosis not present

## 2021-06-30 DIAGNOSIS — N184 Chronic kidney disease, stage 4 (severe): Secondary | ICD-10-CM | POA: Diagnosis not present

## 2021-06-30 DIAGNOSIS — I130 Hypertensive heart and chronic kidney disease with heart failure and stage 1 through stage 4 chronic kidney disease, or unspecified chronic kidney disease: Secondary | ICD-10-CM | POA: Diagnosis not present

## 2021-06-30 DIAGNOSIS — I272 Pulmonary hypertension, unspecified: Secondary | ICD-10-CM | POA: Diagnosis not present

## 2021-06-30 DIAGNOSIS — I5023 Acute on chronic systolic (congestive) heart failure: Secondary | ICD-10-CM | POA: Diagnosis not present

## 2021-06-30 DIAGNOSIS — S42201D Unspecified fracture of upper end of right humerus, subsequent encounter for fracture with routine healing: Secondary | ICD-10-CM | POA: Diagnosis not present

## 2021-06-30 DIAGNOSIS — D631 Anemia in chronic kidney disease: Secondary | ICD-10-CM | POA: Diagnosis not present

## 2021-06-30 DIAGNOSIS — C50412 Malignant neoplasm of upper-outer quadrant of left female breast: Secondary | ICD-10-CM | POA: Diagnosis not present

## 2021-06-30 NOTE — Telephone Encounter (Signed)
Requesting verbals for PT eval. Freq. 1x for 4wks   Best callback #: [phone]

## 2021-07-01 ENCOUNTER — Encounter (HOSPITAL_COMMUNITY): Payer: Self-pay | Admitting: Emergency Medicine

## 2021-07-01 ENCOUNTER — Emergency Department (HOSPITAL_COMMUNITY): Payer: Medicare HMO

## 2021-07-01 ENCOUNTER — Other Ambulatory Visit: Payer: Self-pay

## 2021-07-01 ENCOUNTER — Encounter: Payer: Medicare HMO | Admitting: Internal Medicine

## 2021-07-01 ENCOUNTER — Inpatient Hospital Stay (HOSPITAL_COMMUNITY)
Admission: EM | Admit: 2021-07-01 | Discharge: 2021-07-30 | DRG: 492 | Disposition: A | Discharge status: Skilled Nursing Facility | Payer: Medicare HMO | Attending: Internal Medicine | Admitting: Internal Medicine

## 2021-07-01 DIAGNOSIS — M79671 Pain in right foot: Secondary | ICD-10-CM | POA: Diagnosis not present

## 2021-07-01 DIAGNOSIS — J9601 Acute respiratory failure with hypoxia: Secondary | ICD-10-CM | POA: Insufficient documentation

## 2021-07-01 DIAGNOSIS — M170 Bilateral primary osteoarthritis of knee: Secondary | ICD-10-CM | POA: Diagnosis present

## 2021-07-01 DIAGNOSIS — G9340 Encephalopathy, unspecified: Secondary | ICD-10-CM | POA: Diagnosis not present

## 2021-07-01 DIAGNOSIS — D696 Thrombocytopenia, unspecified: Secondary | ICD-10-CM | POA: Diagnosis present

## 2021-07-01 DIAGNOSIS — G9341 Metabolic encephalopathy: Secondary | ICD-10-CM | POA: Diagnosis not present

## 2021-07-01 DIAGNOSIS — I890 Lymphedema, not elsewhere classified: Secondary | ICD-10-CM

## 2021-07-01 DIAGNOSIS — I130 Hypertensive heart and chronic kidney disease with heart failure and stage 1 through stage 4 chronic kidney disease, or unspecified chronic kidney disease: Secondary | ICD-10-CM | POA: Diagnosis present

## 2021-07-01 DIAGNOSIS — E875 Hyperkalemia: Secondary | ICD-10-CM | POA: Diagnosis not present

## 2021-07-01 DIAGNOSIS — S43011A Anterior subluxation of right humerus, initial encounter: Secondary | ICD-10-CM | POA: Diagnosis not present

## 2021-07-01 DIAGNOSIS — Z7189 Other specified counseling: Secondary | ICD-10-CM | POA: Diagnosis not present

## 2021-07-01 DIAGNOSIS — I10 Essential (primary) hypertension: Secondary | ICD-10-CM | POA: Diagnosis present

## 2021-07-01 DIAGNOSIS — M24411 Recurrent dislocation, right shoulder: Secondary | ICD-10-CM | POA: Diagnosis present

## 2021-07-01 DIAGNOSIS — D630 Anemia in neoplastic disease: Secondary | ICD-10-CM | POA: Diagnosis present

## 2021-07-01 DIAGNOSIS — K760 Fatty (change of) liver, not elsewhere classified: Secondary | ICD-10-CM | POA: Diagnosis present

## 2021-07-01 DIAGNOSIS — E872 Acidosis: Secondary | ICD-10-CM | POA: Diagnosis present

## 2021-07-01 DIAGNOSIS — Z880 Allergy status to penicillin: Secondary | ICD-10-CM

## 2021-07-01 DIAGNOSIS — F05 Delirium due to known physiological condition: Secondary | ICD-10-CM | POA: Diagnosis not present

## 2021-07-01 DIAGNOSIS — R4182 Altered mental status, unspecified: Secondary | ICD-10-CM | POA: Diagnosis not present

## 2021-07-01 DIAGNOSIS — D62 Acute posthemorrhagic anemia: Secondary | ICD-10-CM | POA: Diagnosis not present

## 2021-07-01 DIAGNOSIS — I482 Chronic atrial fibrillation, unspecified: Secondary | ICD-10-CM | POA: Diagnosis not present

## 2021-07-01 DIAGNOSIS — R52 Pain, unspecified: Secondary | ICD-10-CM | POA: Diagnosis present

## 2021-07-01 DIAGNOSIS — R7982 Elevated C-reactive protein (CRP): Secondary | ICD-10-CM | POA: Diagnosis present

## 2021-07-01 DIAGNOSIS — T8143XA Infection following a procedure, organ and space surgical site, initial encounter: Secondary | ICD-10-CM | POA: Diagnosis not present

## 2021-07-01 DIAGNOSIS — K7290 Hepatic failure, unspecified without coma: Secondary | ICD-10-CM | POA: Diagnosis not present

## 2021-07-01 DIAGNOSIS — T8459XA Infection and inflammatory reaction due to other internal joint prosthesis, initial encounter: Secondary | ICD-10-CM | POA: Diagnosis present

## 2021-07-01 DIAGNOSIS — Z743 Need for continuous supervision: Secondary | ICD-10-CM | POA: Diagnosis not present

## 2021-07-01 DIAGNOSIS — Z6841 Body Mass Index (BMI) 40.0 and over, adult: Secondary | ICD-10-CM | POA: Diagnosis not present

## 2021-07-01 DIAGNOSIS — N1832 Chronic kidney disease, stage 3b: Secondary | ICD-10-CM | POA: Diagnosis present

## 2021-07-01 DIAGNOSIS — R609 Edema, unspecified: Secondary | ICD-10-CM | POA: Diagnosis not present

## 2021-07-01 DIAGNOSIS — I4821 Permanent atrial fibrillation: Secondary | ICD-10-CM | POA: Diagnosis present

## 2021-07-01 DIAGNOSIS — N184 Chronic kidney disease, stage 4 (severe): Secondary | ICD-10-CM | POA: Diagnosis not present

## 2021-07-01 DIAGNOSIS — M00811 Arthritis due to other bacteria, right shoulder: Secondary | ICD-10-CM | POA: Diagnosis not present

## 2021-07-01 DIAGNOSIS — W19XXXA Unspecified fall, initial encounter: Secondary | ICD-10-CM | POA: Diagnosis not present

## 2021-07-01 DIAGNOSIS — M79604 Pain in right leg: Secondary | ICD-10-CM

## 2021-07-01 DIAGNOSIS — L0291 Cutaneous abscess, unspecified: Secondary | ICD-10-CM | POA: Diagnosis not present

## 2021-07-01 DIAGNOSIS — M7989 Other specified soft tissue disorders: Secondary | ICD-10-CM | POA: Diagnosis not present

## 2021-07-01 DIAGNOSIS — Z603 Acculturation difficulty: Secondary | ICD-10-CM | POA: Diagnosis present

## 2021-07-01 DIAGNOSIS — C50412 Malignant neoplasm of upper-outer quadrant of left female breast: Secondary | ICD-10-CM | POA: Diagnosis present

## 2021-07-01 DIAGNOSIS — L02413 Cutaneous abscess of right upper limb: Secondary | ICD-10-CM | POA: Diagnosis not present

## 2021-07-01 DIAGNOSIS — G8918 Other acute postprocedural pain: Secondary | ICD-10-CM | POA: Diagnosis not present

## 2021-07-01 DIAGNOSIS — Z950 Presence of cardiac pacemaker: Secondary | ICD-10-CM | POA: Diagnosis not present

## 2021-07-01 DIAGNOSIS — Z09 Encounter for follow-up examination after completed treatment for conditions other than malignant neoplasm: Secondary | ICD-10-CM

## 2021-07-01 DIAGNOSIS — K5900 Constipation, unspecified: Secondary | ICD-10-CM | POA: Diagnosis present

## 2021-07-01 DIAGNOSIS — L03116 Cellulitis of left lower limb: Secondary | ICD-10-CM | POA: Diagnosis not present

## 2021-07-01 DIAGNOSIS — J811 Chronic pulmonary edema: Secondary | ICD-10-CM | POA: Diagnosis not present

## 2021-07-01 DIAGNOSIS — K828 Other specified diseases of gallbladder: Secondary | ICD-10-CM | POA: Diagnosis not present

## 2021-07-01 DIAGNOSIS — S42121A Displaced fracture of acromial process, right shoulder, initial encounter for closed fracture: Secondary | ICD-10-CM | POA: Diagnosis not present

## 2021-07-01 DIAGNOSIS — E876 Hypokalemia: Secondary | ICD-10-CM | POA: Diagnosis not present

## 2021-07-01 DIAGNOSIS — E66812 Obesity, class 2: Secondary | ICD-10-CM | POA: Diagnosis present

## 2021-07-01 DIAGNOSIS — W06XXXA Fall from bed, initial encounter: Secondary | ICD-10-CM | POA: Diagnosis present

## 2021-07-01 DIAGNOSIS — D638 Anemia in other chronic diseases classified elsewhere: Secondary | ICD-10-CM | POA: Diagnosis not present

## 2021-07-01 DIAGNOSIS — Y831 Surgical operation with implant of artificial internal device as the cause of abnormal reaction of the patient, or of later complication, without mention of misadventure at the time of the procedure: Secondary | ICD-10-CM | POA: Diagnosis present

## 2021-07-01 DIAGNOSIS — L89893 Pressure ulcer of other site, stage 3: Secondary | ICD-10-CM | POA: Diagnosis present

## 2021-07-01 DIAGNOSIS — I272 Pulmonary hypertension, unspecified: Secondary | ICD-10-CM | POA: Diagnosis present

## 2021-07-01 DIAGNOSIS — I517 Cardiomegaly: Secondary | ICD-10-CM | POA: Diagnosis not present

## 2021-07-01 DIAGNOSIS — I959 Hypotension, unspecified: Secondary | ICD-10-CM | POA: Diagnosis not present

## 2021-07-01 DIAGNOSIS — K219 Gastro-esophageal reflux disease without esophagitis: Secondary | ICD-10-CM | POA: Diagnosis present

## 2021-07-01 DIAGNOSIS — R062 Wheezing: Secondary | ICD-10-CM | POA: Diagnosis not present

## 2021-07-01 DIAGNOSIS — E039 Hypothyroidism, unspecified: Secondary | ICD-10-CM | POA: Diagnosis present

## 2021-07-01 DIAGNOSIS — M009 Pyogenic arthritis, unspecified: Secondary | ICD-10-CM | POA: Diagnosis not present

## 2021-07-01 DIAGNOSIS — I5023 Acute on chronic systolic (congestive) heart failure: Secondary | ICD-10-CM | POA: Diagnosis present

## 2021-07-01 DIAGNOSIS — R296 Repeated falls: Secondary | ICD-10-CM | POA: Diagnosis present

## 2021-07-01 DIAGNOSIS — D259 Leiomyoma of uterus, unspecified: Secondary | ICD-10-CM | POA: Diagnosis not present

## 2021-07-01 DIAGNOSIS — Z923 Personal history of irradiation: Secondary | ICD-10-CM

## 2021-07-01 DIAGNOSIS — R791 Abnormal coagulation profile: Secondary | ICD-10-CM

## 2021-07-01 DIAGNOSIS — D631 Anemia in chronic kidney disease: Secondary | ICD-10-CM | POA: Diagnosis present

## 2021-07-01 DIAGNOSIS — Z471 Aftercare following joint replacement surgery: Secondary | ICD-10-CM | POA: Diagnosis not present

## 2021-07-01 DIAGNOSIS — T8450XD Infection and inflammatory reaction due to unspecified internal joint prosthesis, subsequent encounter: Secondary | ICD-10-CM | POA: Diagnosis not present

## 2021-07-01 DIAGNOSIS — T8450XA Infection and inflammatory reaction due to unspecified internal joint prosthesis, initial encounter: Secondary | ICD-10-CM | POA: Diagnosis not present

## 2021-07-01 DIAGNOSIS — Z170 Estrogen receptor positive status [ER+]: Secondary | ICD-10-CM

## 2021-07-01 DIAGNOSIS — L89152 Pressure ulcer of sacral region, stage 2: Secondary | ICD-10-CM | POA: Diagnosis present

## 2021-07-01 DIAGNOSIS — Z20822 Contact with and (suspected) exposure to covid-19: Secondary | ICD-10-CM | POA: Diagnosis present

## 2021-07-01 DIAGNOSIS — N179 Acute kidney failure, unspecified: Secondary | ICD-10-CM | POA: Diagnosis present

## 2021-07-01 DIAGNOSIS — E662 Morbid (severe) obesity with alveolar hypoventilation: Secondary | ICD-10-CM | POA: Diagnosis present

## 2021-07-01 DIAGNOSIS — M47817 Spondylosis without myelopathy or radiculopathy, lumbosacral region: Secondary | ICD-10-CM | POA: Diagnosis not present

## 2021-07-01 DIAGNOSIS — T8484XA Pain due to internal orthopedic prosthetic devices, implants and grafts, initial encounter: Secondary | ICD-10-CM | POA: Diagnosis not present

## 2021-07-01 DIAGNOSIS — Z888 Allergy status to other drugs, medicaments and biological substances status: Secondary | ICD-10-CM

## 2021-07-01 DIAGNOSIS — M1711 Unilateral primary osteoarthritis, right knee: Secondary | ICD-10-CM | POA: Diagnosis not present

## 2021-07-01 DIAGNOSIS — L8990 Pressure ulcer of unspecified site, unspecified stage: Secondary | ICD-10-CM | POA: Insufficient documentation

## 2021-07-01 DIAGNOSIS — Z043 Encounter for examination and observation following other accident: Secondary | ICD-10-CM | POA: Diagnosis not present

## 2021-07-01 DIAGNOSIS — Z9889 Other specified postprocedural states: Secondary | ICD-10-CM | POA: Diagnosis not present

## 2021-07-01 DIAGNOSIS — M25511 Pain in right shoulder: Secondary | ICD-10-CM

## 2021-07-01 DIAGNOSIS — M19011 Primary osteoarthritis, right shoulder: Secondary | ICD-10-CM | POA: Diagnosis not present

## 2021-07-01 DIAGNOSIS — Z96611 Presence of right artificial shoulder joint: Secondary | ICD-10-CM | POA: Diagnosis not present

## 2021-07-01 DIAGNOSIS — R0689 Other abnormalities of breathing: Secondary | ICD-10-CM | POA: Diagnosis not present

## 2021-07-01 DIAGNOSIS — Z7901 Long term (current) use of anticoagulants: Secondary | ICD-10-CM

## 2021-07-01 DIAGNOSIS — M79605 Pain in left leg: Secondary | ICD-10-CM

## 2021-07-01 DIAGNOSIS — R0902 Hypoxemia: Secondary | ICD-10-CM | POA: Diagnosis not present

## 2021-07-01 DIAGNOSIS — R531 Weakness: Secondary | ICD-10-CM | POA: Diagnosis not present

## 2021-07-01 DIAGNOSIS — M25411 Effusion, right shoulder: Secondary | ICD-10-CM | POA: Diagnosis not present

## 2021-07-01 DIAGNOSIS — G8929 Other chronic pain: Secondary | ICD-10-CM | POA: Diagnosis present

## 2021-07-01 DIAGNOSIS — M6281 Muscle weakness (generalized): Secondary | ICD-10-CM | POA: Diagnosis not present

## 2021-07-01 DIAGNOSIS — K7460 Unspecified cirrhosis of liver: Secondary | ICD-10-CM | POA: Diagnosis not present

## 2021-07-01 DIAGNOSIS — M5136 Other intervertebral disc degeneration, lumbar region: Secondary | ICD-10-CM | POA: Diagnosis not present

## 2021-07-01 DIAGNOSIS — M01X11 Direct infection of right shoulder in infectious and parasitic diseases classified elsewhere: Secondary | ICD-10-CM | POA: Diagnosis not present

## 2021-07-01 DIAGNOSIS — Z8679 Personal history of other diseases of the circulatory system: Secondary | ICD-10-CM | POA: Diagnosis not present

## 2021-07-01 DIAGNOSIS — L538 Other specified erythematous conditions: Secondary | ICD-10-CM | POA: Diagnosis not present

## 2021-07-01 DIAGNOSIS — Z79810 Long term (current) use of selective estrogen receptor modulators (SERMs): Secondary | ICD-10-CM

## 2021-07-01 DIAGNOSIS — R509 Fever, unspecified: Secondary | ICD-10-CM | POA: Diagnosis not present

## 2021-07-01 DIAGNOSIS — M4316 Spondylolisthesis, lumbar region: Secondary | ICD-10-CM | POA: Diagnosis not present

## 2021-07-01 DIAGNOSIS — M958 Other specified acquired deformities of musculoskeletal system: Secondary | ICD-10-CM | POA: Diagnosis not present

## 2021-07-01 DIAGNOSIS — M47816 Spondylosis without myelopathy or radiculopathy, lumbar region: Secondary | ICD-10-CM | POA: Diagnosis not present

## 2021-07-01 DIAGNOSIS — S43004A Unspecified dislocation of right shoulder joint, initial encounter: Secondary | ICD-10-CM | POA: Diagnosis not present

## 2021-07-01 DIAGNOSIS — Z79890 Hormone replacement therapy (postmenopausal): Secondary | ICD-10-CM

## 2021-07-01 DIAGNOSIS — Z79899 Other long term (current) drug therapy: Secondary | ICD-10-CM

## 2021-07-01 DIAGNOSIS — R262 Difficulty in walking, not elsewhere classified: Secondary | ICD-10-CM

## 2021-07-01 DIAGNOSIS — Z7902 Long term (current) use of antithrombotics/antiplatelets: Secondary | ICD-10-CM | POA: Diagnosis not present

## 2021-07-01 DIAGNOSIS — M48061 Spinal stenosis, lumbar region without neurogenic claudication: Secondary | ICD-10-CM | POA: Diagnosis not present

## 2021-07-01 DIAGNOSIS — E8770 Fluid overload, unspecified: Secondary | ICD-10-CM | POA: Diagnosis present

## 2021-07-01 DIAGNOSIS — E785 Hyperlipidemia, unspecified: Secondary | ICD-10-CM | POA: Diagnosis present

## 2021-07-01 DIAGNOSIS — M25462 Effusion, left knee: Secondary | ICD-10-CM | POA: Diagnosis not present

## 2021-07-01 DIAGNOSIS — Z452 Encounter for adjustment and management of vascular access device: Secondary | ICD-10-CM | POA: Diagnosis not present

## 2021-07-01 DIAGNOSIS — Z515 Encounter for palliative care: Secondary | ICD-10-CM | POA: Diagnosis not present

## 2021-07-01 DIAGNOSIS — R790 Abnormal level of blood mineral: Secondary | ICD-10-CM | POA: Diagnosis not present

## 2021-07-01 DIAGNOSIS — Z8249 Family history of ischemic heart disease and other diseases of the circulatory system: Secondary | ICD-10-CM

## 2021-07-01 DIAGNOSIS — D689 Coagulation defect, unspecified: Secondary | ICD-10-CM | POA: Diagnosis not present

## 2021-07-01 LAB — BASIC METABOLIC PANEL
Anion gap: 10 (ref 5–15)
BUN: 56 mg/dL — ABNORMAL HIGH (ref 8–23)
CO2: 27 mmol/L (ref 22–32)
Calcium: 10 mg/dL (ref 8.9–10.3)
Chloride: 100 mmol/L (ref 98–111)
Creatinine, Ser: 1.99 mg/dL — ABNORMAL HIGH (ref 0.44–1.00)
GFR, Estimated: 26 mL/min — ABNORMAL LOW (ref >60)
Glucose, Bld: 91 mg/dL (ref 70–99)
Potassium: 4 mmol/L (ref 3.5–5.1)
Sodium: 137 mmol/L (ref 135–145)

## 2021-07-01 LAB — URINALYSIS, ROUTINE W REFLEX MICROSCOPIC
Bilirubin Urine: NEGATIVE
Glucose, UA: NEGATIVE mg/dL
Ketones, ur: NEGATIVE mg/dL
Nitrite: NEGATIVE
Protein, ur: NEGATIVE mg/dL
Specific Gravity, Urine: 1.012 (ref 1.005–1.030)
pH: 5 (ref 5.0–8.0)

## 2021-07-01 LAB — CBC WITH DIFFERENTIAL/PLATELET
Abs Immature Granulocytes: 0.11 10*3/uL — ABNORMAL HIGH (ref 0.00–0.07)
Basophils Absolute: 0 10*3/uL (ref 0.0–0.1)
Basophils Relative: 0 %
Eosinophils Absolute: 0 10*3/uL (ref 0.0–0.5)
Eosinophils Relative: 0 %
HCT: 32.2 % — ABNORMAL LOW (ref 36.0–46.0)
Hemoglobin: 10.1 g/dL — ABNORMAL LOW (ref 12.0–15.0)
Immature Granulocytes: 1 %
Lymphocytes Relative: 4 %
Lymphs Abs: 0.5 10*3/uL — ABNORMAL LOW (ref 0.7–4.0)
MCH: 29.7 pg (ref 26.0–34.0)
MCHC: 31.4 g/dL (ref 30.0–36.0)
MCV: 94.7 fL (ref 80.0–100.0)
Monocytes Absolute: 0.8 10*3/uL (ref 0.1–1.0)
Monocytes Relative: 6 %
Neutro Abs: 11.7 10*3/uL — ABNORMAL HIGH (ref 1.7–7.7)
Neutrophils Relative %: 89 %
Platelets: 107 10*3/uL — ABNORMAL LOW (ref 150–400)
RBC: 3.4 MIL/uL — ABNORMAL LOW (ref 3.87–5.11)
RDW: 17 % — ABNORMAL HIGH (ref 11.5–15.5)
WBC: 13.2 10*3/uL — ABNORMAL HIGH (ref 4.0–10.5)
nRBC: 0.2 % (ref 0.0–0.2)

## 2021-07-01 LAB — PROTIME-INR
INR: 4.3 (ref 0.8–1.2)
Prothrombin Time: 41.2 seconds — ABNORMAL HIGH (ref 11.4–15.2)

## 2021-07-01 MED ORDER — TORSEMIDE 20 MG PO TABS
60.0000 mg | ORAL_TABLET | Freq: Every morning | ORAL | Status: DC
  Administered 2021-07-02: 60 mg via ORAL
  Filled 2021-07-01: qty 3

## 2021-07-01 MED ORDER — MAGNESIUM OXIDE -MG SUPPLEMENT 400 (240 MG) MG PO TABS
400.0000 mg | ORAL_TABLET | Freq: Two times a day (BID) | ORAL | Status: DC
  Administered 2021-07-01 – 2021-07-21 (×39): 400 mg via ORAL
  Filled 2021-07-01 (×43): qty 1

## 2021-07-01 MED ORDER — MORPHINE SULFATE (PF) 4 MG/ML IV SOLN
4.0000 mg | Freq: Once | INTRAVENOUS | Status: AC
  Administered 2021-07-01: 4 mg via INTRAVENOUS
  Filled 2021-07-01: qty 1

## 2021-07-01 MED ORDER — ONDANSETRON HCL 4 MG/2ML IJ SOLN
4.0000 mg | Freq: Once | INTRAMUSCULAR | Status: AC
  Administered 2021-07-01: 4 mg via INTRAVENOUS
  Filled 2021-07-01: qty 2

## 2021-07-01 MED ORDER — LEVOTHYROXINE SODIUM 50 MCG PO TABS
50.0000 ug | ORAL_TABLET | Freq: Every day | ORAL | Status: DC
  Administered 2021-07-02 – 2021-07-30 (×29): 50 ug via ORAL
  Filled 2021-07-01 (×29): qty 1

## 2021-07-01 MED ORDER — METHOCARBAMOL 500 MG PO TABS
500.0000 mg | ORAL_TABLET | Freq: Three times a day (TID) | ORAL | Status: DC | PRN
  Administered 2021-07-01 – 2021-07-14 (×9): 500 mg via ORAL
  Filled 2021-07-01 (×9): qty 1

## 2021-07-01 MED ORDER — VITAMIN D3 50 MCG (2000 UT) PO CAPS: 2000.0000 [IU] | ORAL_CAPSULE | Freq: Every day | ORAL | Status: DC

## 2021-07-01 MED ORDER — LEVOTHYROXINE SODIUM 50 MCG PO TABS: 50.0000 ug | ORAL_TABLET | Freq: Every day | ORAL | Status: DC

## 2021-07-01 MED ORDER — POTASSIUM CHLORIDE ER 8 MEQ PO TBCR: 8.0000 meq | EXTENDED_RELEASE_TABLET | Freq: Every day | ORAL | Status: DC

## 2021-07-01 MED ORDER — EXEMESTANE 25 MG PO TABS
25.0000 mg | ORAL_TABLET | Freq: Every day | ORAL | Status: DC
  Administered 2021-07-03 – 2021-07-30 (×23): 25 mg via ORAL
  Filled 2021-07-01 (×30): qty 1

## 2021-07-01 MED ORDER — SODIUM CHLORIDE 0.9 % IV BOLUS
1000.0000 mL | Freq: Once | INTRAVENOUS | Status: AC
  Administered 2021-07-01: 1000 mL via INTRAVENOUS

## 2021-07-01 MED ORDER — PANTOPRAZOLE SODIUM 40 MG PO TBEC
40.0000 mg | DELAYED_RELEASE_TABLET | Freq: Every day | ORAL | Status: DC
  Administered 2021-07-01 – 2021-07-30 (×29): 40 mg via ORAL
  Filled 2021-07-01 (×30): qty 1

## 2021-07-01 MED ORDER — VITAMIN B-12 1000 MCG PO TABS
2000.0000 ug | ORAL_TABLET | Freq: Every day | ORAL | Status: DC
  Administered 2021-07-02 – 2021-07-30 (×28): 2000 ug via ORAL
  Filled 2021-07-01 (×29): qty 2

## 2021-07-01 MED ORDER — PRAVASTATIN SODIUM 20 MG PO TABS
20.0000 mg | ORAL_TABLET | Freq: Every day | ORAL | Status: DC
  Administered 2021-07-01 – 2021-07-30 (×29): 20 mg via ORAL
  Filled 2021-07-01 (×30): qty 1

## 2021-07-01 MED ORDER — TRAMADOL HCL 50 MG PO TABS
50.0000 mg | ORAL_TABLET | Freq: Three times a day (TID) | ORAL | Status: DC | PRN
  Administered 2021-07-01 – 2021-07-21 (×18): 50 mg via ORAL
  Filled 2021-07-01 (×21): qty 1

## 2021-07-01 MED ORDER — PANCRELIPASE (LIP-PROT-AMYL) 12000-38000 UNITS PO CPEP
12000.0000 [IU] | ORAL_CAPSULE | Freq: Every day | ORAL | Status: DC | PRN
  Filled 2021-07-01: qty 1

## 2021-07-01 MED ORDER — ALLOPURINOL 100 MG PO TABS
100.0000 mg | ORAL_TABLET | Freq: Every day | ORAL | Status: DC
  Administered 2021-07-01: 100 mg via ORAL
  Filled 2021-07-01 (×2): qty 1

## 2021-07-01 MED ORDER — POLYETHYLENE GLYCOL 3350 17 GM/SCOOP PO POWD: 17.0000 g | Freq: Every day | ORAL | Status: DC | PRN

## 2021-07-01 MED ORDER — TORSEMIDE 20 MG PO TABS
40.0000 mg | ORAL_TABLET | Freq: Every day | ORAL | Status: DC
  Administered 2021-07-01 – 2021-07-02 (×2): 40 mg via ORAL
  Filled 2021-07-01 (×3): qty 2

## 2021-07-01 MED ORDER — DICLOFENAC SODIUM 1 % EX GEL
4.0000 g | Freq: Four times a day (QID) | CUTANEOUS | Status: DC | PRN
  Administered 2021-07-15 – 2021-07-30 (×6): 4 g via TOPICAL
  Filled 2021-07-01 (×3): qty 100

## 2021-07-01 MED ORDER — POTASSIUM CHLORIDE CRYS ER 10 MEQ PO TBCR
10.0000 meq | EXTENDED_RELEASE_TABLET | Freq: Every day | ORAL | Status: DC
  Administered 2021-07-02 – 2021-07-06 (×4): 10 meq via ORAL
  Filled 2021-07-01 (×5): qty 1

## 2021-07-01 MED ORDER — OXYCODONE-ACETAMINOPHEN 5-325 MG PO TABS
1.0000 | ORAL_TABLET | Freq: Four times a day (QID) | ORAL | Status: DC | PRN
  Administered 2021-07-01 – 2021-07-03 (×3): 2 via ORAL
  Administered 2021-07-05: 1 via ORAL
  Administered 2021-07-06 – 2021-07-08 (×3): 2 via ORAL
  Administered 2021-07-08: 1 via ORAL
  Administered 2021-07-09: 2 via ORAL
  Filled 2021-07-01 (×3): qty 2
  Filled 2021-07-01: qty 1
  Filled 2021-07-01 (×2): qty 2
  Filled 2021-07-01: qty 1
  Filled 2021-07-01 (×2): qty 2

## 2021-07-01 MED ORDER — VITAMIN D 25 MCG (1000 UNIT) PO TABS
2000.0000 [IU] | ORAL_TABLET | Freq: Every day | ORAL | Status: DC
  Administered 2021-07-02 – 2021-07-30 (×28): 2000 [IU] via ORAL
  Filled 2021-07-01 (×29): qty 2

## 2021-07-01 MED ORDER — CARVEDILOL 25 MG PO TABS
25.0000 mg | ORAL_TABLET | Freq: Two times a day (BID) | ORAL | Status: DC
  Administered 2021-07-01 – 2021-07-14 (×26): 25 mg via ORAL
  Filled 2021-07-01 (×14): qty 1
  Filled 2021-07-01: qty 2
  Filled 2021-07-01 (×8): qty 1
  Filled 2021-07-01: qty 2
  Filled 2021-07-01 (×2): qty 1

## 2021-07-01 MED ORDER — POLYETHYLENE GLYCOL 3350 17 G PO PACK
17.0000 g | PACK | Freq: Every day | ORAL | Status: DC | PRN
  Administered 2021-07-11 – 2021-07-17 (×4): 17 g via ORAL
  Filled 2021-07-01 (×4): qty 1

## 2021-07-01 NOTE — ED Provider Notes (Signed)
[facility] DEPT Provider Note   CSN: [id number] Arrival date & time: 07/01/21  1107     History Chief Complaint  Patient presents with   Dysuria    Katelyn Jackson is a 75 y.o. female.  Pt presents to the ED today with nausea and leg pain.  Pt fell on 8/22 and has had pain in her knees since then.  Her pcp has been contacted and is trying to arrange for PT.  Pt is on coumadin for hx of afib.  Pt developed nausea and dysuria last night.  Due to language barrier, an interpreter was present during the history-taking and subsequent discussion (and for part of the physical exam) with this patient.       Past Medical History:  Diagnosis Date   Arthritis    Cancer [facility])    breast cancer   Chronic atrial fibrillation (HCC)    Chronic kidney disease    sees Dr Florene Glen   Cramps, muscle, general    Dyspnea on exertion    Dysrhythmia    a-fib,    GERD (gastroesophageal reflux disease)    Headache    Hyperlipidemia    Hypertension    Hypothyroidism    Lymphedema    Moderate to severe pulmonary hypertension ([place])    Obesity    Personal history of radiation therapy    Pneumonia 12/2020   Presence of permanent cardiac pacemaker 01/21/2021   for bradycardia    Syncope 12/2020   needed a pacemaker    Patient Active Problem List   Diagnosis Date Noted   Postoperative anemia due to acute blood loss 03/19/2021   Dyspnea 03/19/2021   Symptomatic anemia 03/19/2021   Status post right shoulder hemiarthroplasty 03/17/2021   Leg pain, lateral, left 10/08/2020   Abscess of left thumb 06/17/2020   Cellulitis and abscess of left leg 06/17/2020   Herpes zoster 02/18/2020   GERD (gastroesophageal reflux disease) 11/25/2019   Wheezing 10/26/2017   Sciatica of left side 08/29/2017   Long term (current) use of anticoagulants 08/02/2017   Morbid obesity ([place]) 07/11/2017   Hyperglycemia 07/11/2017   Low back pain 06/19/2017   CRF (chronic renal  failure), stage 4 (severe) ([place]) 06/19/2017   Hypokalemia 06/19/2017   Acute lower UTI 06/19/2017   Anemia of chronic disease 02/16/2017   Malignant neoplasm of upper-outer quadrant of left breast in female, estrogen receptor positive ([place]) 02/10/2017   Essential hypertension 04/05/2016   Dyslipidemia 04/05/2016   Chronic atrial fibrillation ([place]) 04/05/2016   Pulmonary hypertension ([place]) 04/05/2016   Lymphedema 04/05/2016    Past Surgical History:  Procedure Laterality Date   APPENDECTOMY     BREAST BIOPSY Left 2018   BREAST LUMPECTOMY Left 04/04/2017   x3   BREAST LUMPECTOMY WITH NEEDLE LOCALIZATION AND AXILLARY SENTINEL LYMPH NODE BX Left 04/04/2017   Procedure: BREAST LUMPECTOMY WITH NEEDLE LOCALIZATION x3 AND AXILLARY SENTINEL LYMPH NODE BX;  Surgeon: [name], MD;  Location: [facility];  Service: General;  Laterality: Left;   CATARACT EXTRACTION W/ INTRAOCULAR LENS  IMPLANT, BILATERAL Bilateral 2018   COLONOSCOPY     EYE SURGERY     GLAUCOMA SURGERY Bilateral 2018   INSERT / REPLACE / REMOVE PACEMAKER  01/21/2021   RE-EXCISION OF BREAST CANCER,SUPERIOR MARGINS Left 04/26/2017   Procedure: RE-EXCISION OF LEFT BREAST CANCER;  Surgeon: [name], MD;  Location: [facility];  Service: General;  Laterality: Left;   SHOULDER HEMI-ARTHROPLASTY Right 03/17/2021   Procedure: SHOULDER HEMI-ARTHROPLASTY;  Surgeon: Hiram Gash, MD;  Location: [facility];  Service: Orthopedics;  Laterality: Right;     OB History   No obstetric history on file.     Family History  Problem Relation Age of Onset   CAD Mother 18   Stroke Mother 103       hemorr CVA   COPD Father 2   Cancer Neg Hx     Social History   Tobacco Use   Smoking status: Never   Smokeless tobacco: Never  Vaping Use   Vaping Use: Never used  Substance Use Topics   Alcohol use: No    Alcohol/week: 0.0 standard drinks   Drug use: No    Home Medications Prior to Admission medications   Medication Sig Start Date End Date  Taking? Authorizing Provider  allopurinol (ZYLOPRIM) 100 MG tablet Take 0.5 tablets (50 mg total) by mouth daily. Patient taking differently: Take 100 mg by mouth daily. 10/08/20  Yes Plotnikov, [name], MD  carvedilol (COREG) 25 MG tablet Take 1 tablet (25 mg total) by mouth 2 (two) times daily. 05/11/21 05/11/22 Yes Plotnikov, [name], MD  Cholecalciferol (VITAMIN D3) 2000 units capsule Take 1 capsule (2,000 Units total) by mouth daily. 07/11/17  Yes Plotnikov, [name], MD  Cyanocobalamin (VITAMIN B-12 PO) Take 2,000 mcg by mouth daily.   Yes [provider]  diclofenac Sodium (VOLTAREN) 1 % GEL APPLY FOUR GRAMS FOUR TIMES DAILY AS NEEDED Patient taking differently: Apply 4 g topically 4 (four) times daily as needed (pain). 06/11/21  Yes Plotnikov, [name], MD  exemestane (AROMASIN) 25 MG tablet Take 1 tablet (25 mg total) by mouth daily after breakfast. 10/08/20  Yes Plotnikov, [name], MD  levothyroxine (SYNTHROID) 50 MCG tablet Take 1 tablet (50 mcg total) by mouth daily before breakfast. Overdue for Annual appt must see provider for future refills 02/09/21  Yes Plotnikov, [name], MD  lipase/protease/amylase (CREON) 12000-38000 units CPEP capsule Take 1 capsule (12,000 Units total) by mouth daily as needed (stomach problems). 10/08/20  Yes Plotnikov, [name], MD  lovastatin (MEVACOR) 20 MG tablet Take 1 tablet (20 mg total) by mouth daily. 10/08/20  Yes Plotnikov, [name], MD  magnesium oxide (MAG-OX) 400 MG tablet Take 400 mg by mouth 2 (two) times daily.   Yes [provider]  methocarbamol (ROBAXIN) 500 MG tablet Take 1 tablet (500 mg total) by mouth every 8 (eight) hours as needed for muscle spasms. 03/19/21  Yes McBane, Maylene Roes, PA-C  omeprazole (PRILOSEC) 40 MG capsule Take 1 capsule (40 mg total) by mouth daily. Overdue for Annual appt must see provider for future refills Patient taking differently: Take 40 mg by mouth daily as needed (acid reflux). Overdue for  Annual appt must see provider for future refills 10/08/20  Yes Plotnikov, [name], MD  OVER THE COUNTER MEDICATION Apply 1 application topically daily as needed (apply to buttocks  as needed for irritaiton). Intensive Skin Care Therapy   Yes [provider]  polyethylene glycol powder (GLYCOLAX/MIRALAX) powder mix 1 capful (17 grams) IN 8 ounces OF liquid EVERY DAY Patient taking differently: Take 17 g by mouth daily as needed for mild constipation. 11/05/18  Yes Plotnikov, [name], MD  potassium chloride (KLOR-CON) 8 MEQ tablet Take 1 tablet (8 mEq total) by mouth daily. 10/08/20  Yes Plotnikov, [name], MD  torsemide (DEMADEX) 20 MG tablet Take 60 mg every morning and 40 mg in the pm (2 pm) 05/11/21  Yes  Plotnikov, [name], MD  traMADol (ULTRAM) 50 MG tablet Take 1 tablet (50 mg total) by mouth every 8 (eight) hours as needed for severe pain. 03/19/21  Yes McBane, Maylene Roes, PA-C  warfarin (COUMADIN) 5 MG tablet Take 1 tablet daily except take 1/2 tablet on Wed and Sat or Take as directed by anticoagulation clinic Patient taking differently: Take 2.5-5 mg by mouth See admin instructions. Taking 5 mg every day except on Tuesday, taking 2.5 mg daily 10/08/20  Yes Plotnikov, [name], MD  tamoxifen (NOLVADEX) 20 MG tablet Take 1 tablet (20 mg total) by mouth daily. Patient taking differently: Take 20 mg by mouth daily. Will take after finishing Exemestane 06/10/21   Truitt Merle, MD    Allergies    Chlorhexidine gluconate and Penicillins  Review of Systems   Review of Systems  Gastrointestinal:  Positive for nausea.  Musculoskeletal:        Bilateral knee pain  All other systems reviewed and are negative.  Physical Exam Updated Vital Signs BP 133/63   Pulse 71   Temp 97.9 F (36.6 C) (Oral)   Resp 18   Ht 5\' 1"  (1.549 m)   Wt 113.4 kg   LMP  (LMP Unknown)   SpO2 96%   BMI 47.24 kg/m   Physical Exam Vitals and nursing note reviewed.  Constitutional:      Appearance:  Normal appearance. She is obese.  HENT:     Head: Normocephalic and atraumatic.     Right Ear: External ear normal.     Left Ear: External ear normal.     Nose: Nose normal.     Mouth/Throat:     Mouth: Mucous membranes are moist.     Pharynx: Oropharynx is clear.  Eyes:     Extraocular Movements: Extraocular movements intact.     Conjunctiva/sclera: Conjunctivae normal.     Pupils: Pupils are equal, round, and reactive to light.  Cardiovascular:     Rate and Rhythm: Normal rate and regular rhythm.     Pulses: Normal pulses.     Heart sounds: Normal heart sounds.  Pulmonary:     Effort: Pulmonary effort is normal.     Breath sounds: Normal breath sounds.  Abdominal:     General: Abdomen is flat. Bowel sounds are normal.     Palpations: Abdomen is soft.  Musculoskeletal:     Cervical back: Normal range of motion and neck supple.     Right lower leg: Edema present.     Left lower leg: Edema present.     Comments: Decreased rom bilateral knees.    Skin:    General: Skin is warm.     Capillary Refill: Capillary refill takes less than 2 seconds.  Neurological:     General: No focal deficit present.     Mental Status: She is alert and oriented to person, place, and time.  Psychiatric:        Mood and Affect: Mood normal.        Behavior: Behavior normal.    ED Results / Procedures / Treatments   Labs (all labs ordered are listed, but only abnormal results are displayed) Labs Reviewed  BASIC METABOLIC PANEL - Abnormal; Notable for the following components:      Result Value   BUN 56 (*)    Creatinine, Ser 1.99 (*)    GFR, Estimated 26 (*)    All other components within normal limits  CBC WITH DIFFERENTIAL/PLATELET - Abnormal; Notable for  the following components:   WBC 13.2 (*)    RBC 3.40 (*)    Hemoglobin 10.1 (*)    HCT 32.2 (*)    RDW 17.0 (*)    Platelets 107 (*)    Neutro Abs 11.7 (*)    Lymphs Abs 0.5 (*)    Abs Immature Granulocytes 0.11 (*)    All other  components within normal limits  URINALYSIS, ROUTINE W REFLEX MICROSCOPIC - Abnormal; Notable for the following components:   APPearance HAZY (*)    Hgb urine dipstick SMALL (*)    Leukocytes,Ua TRACE (*)    Bacteria, UA RARE (*)    All other components within normal limits  PROTIME-INR - Abnormal; Notable for the following components:   Prothrombin Time 41.2 (*)    INR 4.3 (*)    All other components within normal limits  URINE CULTURE    EKG None  Radiology DG Chest 2 View  Result Date: 07/01/2021 CLINICAL DATA:  Fall EXAM: CHEST - 2 VIEW COMPARISON:  03/18/2021 FINDINGS: Cardiomegaly, unchanged from prior exam. Left chest cardiac device with leads in the right atrium and ventricle. Redemonstrated vascular congestion, with interstitial prominence throughout the lungs, likely interstitial edema. No definite pleural effusion. No acute osseous abnormality. Status post right humeral head replacement. IMPRESSION: Cardiomegaly with pulmonary edema. Electronically Signed   By: Merilyn Baba M.D.   On: 07/01/2021 12:52   CT HEAD WO CONTRAST (5MM)  Result Date: 07/01/2021 CLINICAL DATA:  Altered mental status. EXAM: CT HEAD WITHOUT CONTRAST TECHNIQUE: Contiguous axial images were obtained from the base of the skull through the vertex without intravenous contrast. COMPARISON:  CT head dated January 05, 2021. FINDINGS: Brain: No evidence of acute infarction, hemorrhage, hydrocephalus, extra-axial collection or mass lesion/mass effect. Stable mild atrophy and chronic microvascular ischemic changes. Vascular: No hyperdense vessel or unexpected calcification. Skull: Normal. Negative for fracture or focal lesion. Sinuses/Orbits: No acute finding. Other: None. IMPRESSION: 1. No acute intracranial abnormality. Electronically Signed   By: Titus Dubin M.D.   On: 07/01/2021 13:01   DG Knee Complete 4 Views Left  Result Date: 07/01/2021 CLINICAL DATA:  Fall. EXAM: RIGHT KNEE - COMPLETE 4+ VIEW; LEFT KNEE -  COMPLETE 4+ VIEW COMPARISON:  None. FINDINGS: Left knee: No acute fracture or malalignment. No joint effusion. Tricompartmental joint space narrowing, moderate to severe in the medial and patellofemoral compartments, with large bulky marginal osteophytes. Soft tissues are unremarkable. Right knee: No acute fracture or malalignment. Small joint effusion. Severe tricompartmental joint space narrowing with bone-on-bone apposition in the medial compartment. Bulky marginal osteophytes. Soft tissues are unremarkable. IMPRESSION: 1. No acute osseous abnormality. 2. Severe right and moderate to severe left knee osteoarthritis. Electronically Signed   By: Titus Dubin M.D.   On: 07/01/2021 12:48   DG Knee Complete 4 Views Right  Result Date: 07/01/2021 CLINICAL DATA:  Fall. EXAM: RIGHT KNEE - COMPLETE 4+ VIEW; LEFT KNEE - COMPLETE 4+ VIEW COMPARISON:  None. FINDINGS: Left knee: No acute fracture or malalignment. No joint effusion. Tricompartmental joint space narrowing, moderate to severe in the medial and patellofemoral compartments, with large bulky marginal osteophytes. Soft tissues are unremarkable. Right knee: No acute fracture or malalignment. Small joint effusion. Severe tricompartmental joint space narrowing with bone-on-bone apposition in the medial compartment. Bulky marginal osteophytes. Soft tissues are unremarkable. IMPRESSION: 1. No acute osseous abnormality. 2. Severe right and moderate to severe left knee osteoarthritis. Electronically Signed   By: Titus Dubin M.D.   On:  07/01/2021 12:48    Procedures Procedures   Medications Ordered in ED Medications  allopurinol (ZYLOPRIM) tablet 100 mg (has no administration in time range)  carvedilol (COREG) tablet 25 mg (has no administration in time range)  Vitamin D3 2,000 Units (has no administration in time range)  vitamin B-12 (CYANOCOBALAMIN) tablet 2,000 mcg (has no administration in time range)  diclofenac Sodium (VOLTAREN) 1 % topical gel 4  g (has no administration in time range)  exemestane (AROMASIN) tablet 25 mg (has no administration in time range)  levothyroxine (SYNTHROID) tablet 50 mcg (has no administration in time range)  lipase/protease/amylase (CREON) capsule 12,000 Units (has no administration in time range)  pravastatin (PRAVACHOL) tablet 20 mg (has no administration in time range)  magnesium oxide (MAG-OX) tablet 400 mg (has no administration in time range)  methocarbamol (ROBAXIN) tablet 500 mg (has no administration in time range)  pantoprazole (PROTONIX) EC tablet 40 mg (has no administration in time range)  polyethylene glycol powder (GLYCOLAX/MIRALAX) container 17 g (has no administration in time range)  potassium chloride (KLOR-CON) CR tablet 8 mEq (has no administration in time range)  torsemide (DEMADEX) tablet 60 mg (has no administration in time range)  torsemide (DEMADEX) tablet 40 mg (has no administration in time range)  traMADol (ULTRAM) tablet 50 mg (has no administration in time range)  sodium chloride 0.9 % bolus 1,000 mL (1,000 mLs Intravenous Bolus 07/01/21 1242)  ondansetron (ZOFRAN) injection 4 mg (4 mg Intravenous Given 07/01/21 1242)  morphine 4 MG/ML injection 4 mg (4 mg Intravenous Given 07/01/21 1242)  sodium chloride 0.9 % bolus 1,000 mL (1,000 mLs Intravenous Bolus 07/01/21 1242)    ED Course  I have reviewed the triage vital signs and the nursing notes.  Pertinent labs & imaging results that were available during my care of the patient were reviewed by me and considered in my medical decision making (see chart for details).    MDM Rules/Calculators/A&P                           Pt said she has not been able to walk since she fell.  She lives with her son who works.  Pt said there is no one to take care for her at home.  Pt does not think she should go home.  Physical Therapy consult ordered.  TOC consult ordered as well.  Meds ordered except for coumadin.  INR was elevated.  INR will  be repeated tomorrow.  If ok, then she can restart the inr.   Final Clinical Impression(s) / ED Diagnoses Final diagnoses:  Ambulatory dysfunction  Elevated INR    Rx / DC Orders ED Discharge Orders     None        Isla Pence, MD 07/02/21 [phone]

## 2021-07-01 NOTE — ED Notes (Signed)
Family at bedside. All questions/concerns addressed at this time.

## 2021-07-01 NOTE — Progress Notes (Addendum)
Transition of Care [facility]) - Emergency Department Mini Assessment   Patient Details  Name: Katelyn Jackson MRN: [id number] Date of Birth: October 11, 1946  Transition [facility] CM/SW Contact:    Tawyna Pellot C Tarpley-Carter, [place] Phone Number: 07/01/2021, 4:40 PM   Clinical Narrative: TOC CSW was given permission by pt to speak with her son/[name] [phone].  CSW spoke with pt in regards to pts needs.  Pt lives with [name].  Pt also has a caregiver coming to the home daily to assist with care.  Taiyana Kissler Tarpley-Carter, MSW, LCSW-A Pronouns:  She/Her/Hers [facility] Health[facility]itions of Care Clinical Social Worker Direct Number:  [phone] [email or web address]  ED Mini Assessment: What brought you to the Emergency Department? : Painful urination  Barriers to Discharge: No Barriers Identified     Means of departure: Not know  Interventions which prevented an admission or readmission: SNF Placement    Patient Contact and Communications Key Contact 1: [name] Velsher   Spoke with: [name] Contact Date: 07/01/21,     Contact Phone Number: [phone] Call outcome: Pt lives with son, [name].  Pt also has Logan that comes to the home daily.  Patient states their goals for this hospitalization and ongoing recovery are:: Family wants SNF placement.   Choice offered to / list presented to : Adult Children  Admission diagnosis:  possible uti Patient Active Problem List   Diagnosis Date Noted   Postoperative anemia due to acute blood loss 03/19/2021   Dyspnea 03/19/2021   Symptomatic anemia 03/19/2021   Status post right shoulder hemiarthroplasty 03/17/2021   Leg pain, lateral, left 10/08/2020   Abscess of left thumb 06/17/2020   Cellulitis and abscess of left leg 06/17/2020   Herpes zoster 02/18/2020   GERD (gastroesophageal reflux disease) 11/25/2019   Wheezing 10/26/2017   Sciatica of left side 08/29/2017   Long term (current) use of anticoagulants  08/02/2017   Morbid obesity ([place]) 07/11/2017   Hyperglycemia 07/11/2017   Low back pain 06/19/2017   CRF (chronic renal failure), stage 4 (severe) ([place]) 06/19/2017   Hypokalemia 06/19/2017   Acute lower UTI 06/19/2017   Anemia of chronic disease 02/16/2017   Malignant neoplasm of upper-outer quadrant of left breast in female, estrogen receptor positive ([place]) 02/10/2017   Essential hypertension 04/05/2016   Dyslipidemia 04/05/2016   Chronic atrial fibrillation ([place]) 04/05/2016   Pulmonary hypertension ([place]) 04/05/2016   Lymphedema 04/05/2016   PCP:  Cassandria Anger, MD Pharmacy:   [facility], [place] - [address] [place] [address] Phone: [phone] Fax: [phone]  [facility], [place] [address] [place] [address] Phone: [phone] Fax: [phone]  [id number] - [place], [place] [address] [place] [address] Phone: [phone] Fax: [phone]

## 2021-07-01 NOTE — Telephone Encounter (Signed)
Ok Thx 

## 2021-07-01 NOTE — ED Notes (Signed)
Pt transported to xray 

## 2021-07-01 NOTE — Progress Notes (Addendum)
TOC CSW obtained the consent of both pt and pts son/[name] to fax pt out to facilities.  CSW will continue to update [name] on pts bed offers.  Floyde Dingley Tarpley-Carter, MSW, LCSW-A Pronouns:  She/Her/Hers [facility] HealthTransitions of Care Clinical Social Worker Direct Number:  [phone] [email or web address]

## 2021-07-01 NOTE — ED Notes (Signed)
PURE WICK PLACED.

## 2021-07-01 NOTE — Telephone Encounter (Signed)
Called Don there was no answer LMOM w/MD response.Marland KitchenJohny Jackson

## 2021-07-01 NOTE — ED Notes (Signed)
INR 4.3, [place], MD notified.

## 2021-07-01 NOTE — ED Triage Notes (Signed)
Per EMS-coming from home, family thinks she might have an UTI, painful urination, retention-history of AFIB with pacemaker-300 cc of NS given in route-responsive to painful stimuli-speaks [name]

## 2021-07-01 NOTE — NC FL2 (Signed)
[place] LEVEL OF CARE SCREENING TOOL     IDENTIFICATION  Patient Name: Katelyn Jackson Birthdate: [birth year] Sex: female Admission Date (Current Location): 07/01/2021  [facility] and [place] Number:  Herbalist and Address:  [facility],  [place], [place]      Provider Number: [id number]  Attending Physician Name and Address:  Default, Provider, MD  Relative Name and Phone Number:  Macky Lower [phone]    Current Level of Care: Hospital Recommended Level of Care: [place] Prior Approval Number:    Date Approved/Denied:   PASRR Number: [id number]  Discharge Plan: SNF    Current Diagnoses: Patient Active Problem List   Diagnosis Date Noted   Postoperative anemia due to acute blood loss 03/19/2021   Dyspnea 03/19/2021   Symptomatic anemia 03/19/2021   Status post right shoulder hemiarthroplasty 03/17/2021   Leg pain, lateral, left 10/08/2020   Abscess of left thumb 06/17/2020   Cellulitis and abscess of left leg 06/17/2020   Herpes zoster 02/18/2020   GERD (gastroesophageal reflux disease) 11/25/2019   Wheezing 10/26/2017   Sciatica of left side 08/29/2017   Long term (current) use of anticoagulants 08/02/2017   Morbid obesity ([place]) 07/11/2017   Hyperglycemia 07/11/2017   Low back pain 06/19/2017   CRF (chronic renal failure), stage 4 (severe) ([name]) 06/19/2017   Hypokalemia 06/19/2017   Acute lower UTI 06/19/2017   Anemia of chronic disease 02/16/2017   Malignant neoplasm of upper-outer quadrant of left breast in female, estrogen receptor positive ([place]) 02/10/2017   Essential hypertension 04/05/2016   Dyslipidemia 04/05/2016   Chronic atrial fibrillation ([place]) 04/05/2016   Pulmonary hypertension ([place]) 04/05/2016   Lymphedema 04/05/2016    Orientation RESPIRATION BLADDER Height & Weight     Self, Place  Normal Continent Weight: 250 lb (113.4 kg) Height:  5\' 1"  (154.9 cm)   BEHAVIORAL SYMPTOMS/MOOD NEUROLOGICAL BOWEL NUTRITION STATUS      Continent Diet (Heart Healthy)  AMBULATORY STATUS COMMUNICATION OF NEEDS Skin   Limited Assist Verbally Normal                       Personal Care Assistance Level of Assistance  Bathing, Feeding, Dressing Bathing Assistance: Limited assistance Feeding assistance: Independent Dressing Assistance: Limited assistance     Functional Limitations Info  Sight, Hearing, Speech Sight Info: Adequate Hearing Info: Adequate Speech Info: Adequate (Speaks [name])    [place]  PT (By licensed PT), OT (By licensed OT)     PT Frequency: 5x a week OT Frequency: 5x a week            Contractures Contractures Info: Not present    Additional Factors Info  Code Status, Allergies Code Status Info: Full Allergies Info: Chlorhexidine Gluconate and Penicillins           Current Medications (07/01/2021):  This is the current hospital active medication list Current Facility-Administered Medications  Medication Dose Route Frequency Provider Last Rate Last Admin   allopurinol (ZYLOPRIM) tablet 100 mg  100 mg Oral Daily Isla Pence, MD   100 mg at 07/01/21 2254   carvedilol (COREG) tablet 25 mg  25 mg Oral BID Isla Pence, MD   25 mg at 07/01/21 2254   [START ON 07/02/2021] cholecalciferol (VITAMIN D3) tablet 2,000 Units  2,000 Units Oral Daily Isla Pence, MD       diclofenac Sodium (VOLTAREN) 1 % topical gel 4 g  4  g Topical QID PRN Isla Pence, MD       Derrill Memo ON 07/02/2021] exemestane (AROMASIN) tablet 25 mg  25 mg Oral QPC breakfast Isla Pence, MD       Derrill Memo ON 07/02/2021] levothyroxine (SYNTHROID) tablet 50 mcg  50 mcg Oral QAC breakfast Isla Pence, MD       lipase/protease/amylase (CREON) capsule 12,000 Units  12,000 Units Oral Daily PRN Isla Pence, MD       magnesium oxide (MAG-OX) tablet 400 mg  400 mg Oral BID Isla Pence, MD   400 mg at 07/01/21 2254    methocarbamol (ROBAXIN) tablet 500 mg  500 mg Oral Q8H PRN Isla Pence, MD   500 mg at 07/01/21 1814   oxyCODONE-acetaminophen (PERCOCET/ROXICET) 5-325 MG per tablet 1-2 tablet  1-2 tablet Oral Q6H PRN Lacretia Leigh, MD   2 tablet at 07/01/21 2254   pantoprazole (PROTONIX) EC tablet 40 mg  40 mg Oral Daily Isla Pence, MD   40 mg at 07/01/21 1814   polyethylene glycol (MIRALAX / GLYCOLAX) packet 17 g  17 g Oral Daily PRN Isla Pence, MD       Derrill Memo ON 07/02/2021] potassium chloride (KLOR-CON) CR tablet 10 mEq  10 mEq Oral Daily Isla Pence, MD       pravastatin (PRAVACHOL) tablet 20 mg  20 mg Oral q1800 Isla Pence, MD   20 mg at 07/01/21 1814   torsemide (DEMADEX) tablet 40 mg  40 mg Oral Daily Isla Pence, MD   40 mg at 07/01/21 1814   [START ON 07/02/2021] torsemide (DEMADEX) tablet 60 mg  60 mg Oral q morning Isla Pence, MD       traMADol Veatrice Bourbon) tablet 50 mg  50 mg Oral Q8H PRN Isla Pence, MD   50 mg at 07/01/21 1814   [START ON 07/02/2021] vitamin B-12 (CYANOCOBALAMIN) tablet 2,000 mcg  2,000 mcg Oral Daily Isla Pence, MD       Current Outpatient Medications  Medication Sig Dispense Refill   allopurinol (ZYLOPRIM) 100 MG tablet Take 0.5 tablets (50 mg total) by mouth daily. (Patient taking differently: Take 100 mg by mouth daily.) 45 tablet 3   carvedilol (COREG) 25 MG tablet Take 1 tablet (25 mg total) by mouth 2 (two) times daily. 180 tablet 3   Cholecalciferol (VITAMIN D3) 2000 units capsule Take 1 capsule (2,000 Units total) by mouth daily. 100 capsule 3   Cyanocobalamin (VITAMIN B-12 PO) Take 2,000 mcg by mouth daily.     diclofenac Sodium (VOLTAREN) 1 % GEL APPLY FOUR GRAMS FOUR TIMES DAILY AS NEEDED (Patient taking differently: Apply 4 g topically 4 (four) times daily as needed (pain).) 100 g 5   exemestane (AROMASIN) 25 MG tablet Take 1 tablet (25 mg total) by mouth daily after breakfast. 90 tablet 3   levothyroxine (SYNTHROID) 50 MCG tablet  Take 1 tablet (50 mcg total) by mouth daily before breakfast. Overdue for Annual appt must see provider for future refills 90 tablet 3   lipase/protease/amylase (CREON) 12000-38000 units CPEP capsule Take 1 capsule (12,000 Units total) by mouth daily as needed (stomach problems). 270 capsule 3   lovastatin (MEVACOR) 20 MG tablet Take 1 tablet (20 mg total) by mouth daily. 90 tablet 3   magnesium oxide (MAG-OX) 400 MG tablet Take 400 mg by mouth 2 (two) times daily.     methocarbamol (ROBAXIN) 500 MG tablet Take 1 tablet (500 mg total) by mouth every 8 (eight) hours as needed for muscle spasms. [place]  tablet 0   omeprazole (PRILOSEC) 40 MG capsule Take 1 capsule (40 mg total) by mouth daily. Overdue for Annual appt must see provider for future refills (Patient taking differently: Take 40 mg by mouth daily as needed (acid reflux). Overdue for Annual appt must see provider for future refills) 90 capsule 3   OVER THE COUNTER MEDICATION Apply 1 application topically daily as needed (apply to buttocks  as needed for irritaiton). Intensive Skin Care Therapy     polyethylene glycol powder (GLYCOLAX/MIRALAX) powder mix 1 capful (17 grams) IN 8 ounces OF liquid EVERY DAY (Patient taking differently: Take 17 g by mouth daily as needed for mild constipation.) 1581 g 3   potassium chloride (KLOR-CON) 8 MEQ tablet Take 1 tablet (8 mEq total) by mouth daily. 90 tablet 3   torsemide (DEMADEX) 20 MG tablet Take 60 mg every morning and 40 mg in the pm (2 pm) 450 tablet 3   traMADol (ULTRAM) 50 MG tablet Take 1 tablet (50 mg total) by mouth every 8 (eight) hours as needed for severe pain. 30 tablet 0   warfarin (COUMADIN) 5 MG tablet Take 1 tablet daily except take 1/2 tablet on Wed and Sat or Take as directed by anticoagulation clinic (Patient taking differently: Take 2.5-5 mg by mouth See admin instructions. Taking 5 mg every day except on Tuesday, taking 2.5 mg daily) 90 tablet 1   tamoxifen (NOLVADEX) 20 MG tablet Take 1  tablet (20 mg total) by mouth daily. (Patient taking differently: Take 20 mg by mouth daily. Will take after finishing Exemestane) 30 tablet 5     Discharge Medications: Please see discharge summary for a list of discharge medications.  Relevant Imaging Results:  Relevant Lab Results:   Additional Information SSN# [id number]   Ht:  5\' 1"    Wt:  250 lbs  BMI:  47.24 kg/m2  Izreal Kock C Tarpley-Carter, LCSWA

## 2021-07-01 NOTE — Progress Notes (Signed)
Spoke w/son. Pt is vomiting - taken to ER by ambulance

## 2021-07-01 NOTE — Progress Notes (Signed)
TOC CSW obtained PASSAR #.  [id number] A  Emanie Behan Tarpley-Carter, MSW, LCSW-A Pronouns:  She/Her/Hers [facility] HealthTransitions of Care Clinical Social Worker Direct Number:  [phone] [email or web address]

## 2021-07-02 ENCOUNTER — Encounter (HOSPITAL_COMMUNITY): Payer: Self-pay | Admitting: Internal Medicine

## 2021-07-02 ENCOUNTER — Other Ambulatory Visit: Payer: Self-pay

## 2021-07-02 DIAGNOSIS — N179 Acute kidney failure, unspecified: Secondary | ICD-10-CM | POA: Diagnosis present

## 2021-07-02 DIAGNOSIS — Z950 Presence of cardiac pacemaker: Secondary | ICD-10-CM | POA: Diagnosis not present

## 2021-07-02 DIAGNOSIS — E872 Acidosis: Secondary | ICD-10-CM | POA: Diagnosis present

## 2021-07-02 DIAGNOSIS — R791 Abnormal coagulation profile: Secondary | ICD-10-CM

## 2021-07-02 DIAGNOSIS — D259 Leiomyoma of uterus, unspecified: Secondary | ICD-10-CM | POA: Diagnosis not present

## 2021-07-02 DIAGNOSIS — K219 Gastro-esophageal reflux disease without esophagitis: Secondary | ICD-10-CM | POA: Diagnosis not present

## 2021-07-02 DIAGNOSIS — K7290 Hepatic failure, unspecified without coma: Secondary | ICD-10-CM | POA: Diagnosis present

## 2021-07-02 DIAGNOSIS — I130 Hypertensive heart and chronic kidney disease with heart failure and stage 1 through stage 4 chronic kidney disease, or unspecified chronic kidney disease: Secondary | ICD-10-CM | POA: Diagnosis present

## 2021-07-02 DIAGNOSIS — L02413 Cutaneous abscess of right upper limb: Secondary | ICD-10-CM | POA: Diagnosis present

## 2021-07-02 DIAGNOSIS — Y831 Surgical operation with implant of artificial internal device as the cause of abnormal reaction of the patient, or of later complication, without mention of misadventure at the time of the procedure: Secondary | ICD-10-CM | POA: Diagnosis present

## 2021-07-02 DIAGNOSIS — N1832 Chronic kidney disease, stage 3b: Secondary | ICD-10-CM | POA: Diagnosis present

## 2021-07-02 DIAGNOSIS — I890 Lymphedema, not elsewhere classified: Secondary | ICD-10-CM | POA: Diagnosis not present

## 2021-07-02 DIAGNOSIS — L03116 Cellulitis of left lower limb: Secondary | ICD-10-CM

## 2021-07-02 DIAGNOSIS — Z20822 Contact with and (suspected) exposure to covid-19: Secondary | ICD-10-CM | POA: Diagnosis present

## 2021-07-02 DIAGNOSIS — I4821 Permanent atrial fibrillation: Secondary | ICD-10-CM | POA: Diagnosis present

## 2021-07-02 DIAGNOSIS — L89893 Pressure ulcer of other site, stage 3: Secondary | ICD-10-CM | POA: Diagnosis present

## 2021-07-02 DIAGNOSIS — M79605 Pain in left leg: Secondary | ICD-10-CM | POA: Diagnosis not present

## 2021-07-02 DIAGNOSIS — M19011 Primary osteoarthritis, right shoulder: Secondary | ICD-10-CM | POA: Diagnosis not present

## 2021-07-02 DIAGNOSIS — R531 Weakness: Secondary | ICD-10-CM | POA: Diagnosis not present

## 2021-07-02 DIAGNOSIS — J9601 Acute respiratory failure with hypoxia: Secondary | ICD-10-CM | POA: Diagnosis present

## 2021-07-02 DIAGNOSIS — R52 Pain, unspecified: Secondary | ICD-10-CM | POA: Diagnosis present

## 2021-07-02 DIAGNOSIS — E039 Hypothyroidism, unspecified: Secondary | ICD-10-CM | POA: Diagnosis present

## 2021-07-02 DIAGNOSIS — R609 Edema, unspecified: Secondary | ICD-10-CM | POA: Diagnosis not present

## 2021-07-02 DIAGNOSIS — L89152 Pressure ulcer of sacral region, stage 2: Secondary | ICD-10-CM | POA: Diagnosis present

## 2021-07-02 DIAGNOSIS — I482 Chronic atrial fibrillation, unspecified: Secondary | ICD-10-CM | POA: Diagnosis not present

## 2021-07-02 DIAGNOSIS — M5136 Other intervertebral disc degeneration, lumbar region: Secondary | ICD-10-CM | POA: Diagnosis not present

## 2021-07-02 DIAGNOSIS — D630 Anemia in neoplastic disease: Secondary | ICD-10-CM | POA: Diagnosis present

## 2021-07-02 DIAGNOSIS — M79604 Pain in right leg: Secondary | ICD-10-CM | POA: Diagnosis not present

## 2021-07-02 DIAGNOSIS — F05 Delirium due to known physiological condition: Secondary | ICD-10-CM | POA: Diagnosis not present

## 2021-07-02 DIAGNOSIS — N184 Chronic kidney disease, stage 4 (severe): Secondary | ICD-10-CM | POA: Diagnosis not present

## 2021-07-02 DIAGNOSIS — Z515 Encounter for palliative care: Secondary | ICD-10-CM | POA: Diagnosis not present

## 2021-07-02 DIAGNOSIS — D696 Thrombocytopenia, unspecified: Secondary | ICD-10-CM | POA: Diagnosis present

## 2021-07-02 DIAGNOSIS — D638 Anemia in other chronic diseases classified elsewhere: Secondary | ICD-10-CM | POA: Diagnosis not present

## 2021-07-02 DIAGNOSIS — K7460 Unspecified cirrhosis of liver: Secondary | ICD-10-CM | POA: Diagnosis not present

## 2021-07-02 DIAGNOSIS — L0390 Cellulitis, unspecified: Secondary | ICD-10-CM | POA: Insufficient documentation

## 2021-07-02 DIAGNOSIS — C50412 Malignant neoplasm of upper-outer quadrant of left female breast: Secondary | ICD-10-CM | POA: Diagnosis present

## 2021-07-02 DIAGNOSIS — I5023 Acute on chronic systolic (congestive) heart failure: Secondary | ICD-10-CM | POA: Diagnosis present

## 2021-07-02 DIAGNOSIS — G9341 Metabolic encephalopathy: Secondary | ICD-10-CM | POA: Diagnosis present

## 2021-07-02 DIAGNOSIS — L538 Other specified erythematous conditions: Secondary | ICD-10-CM | POA: Diagnosis not present

## 2021-07-02 DIAGNOSIS — W06XXXA Fall from bed, initial encounter: Secondary | ICD-10-CM | POA: Diagnosis present

## 2021-07-02 DIAGNOSIS — L0291 Cutaneous abscess, unspecified: Secondary | ICD-10-CM | POA: Diagnosis not present

## 2021-07-02 DIAGNOSIS — T8459XA Infection and inflammatory reaction due to other internal joint prosthesis, initial encounter: Secondary | ICD-10-CM | POA: Diagnosis present

## 2021-07-02 DIAGNOSIS — Z6841 Body Mass Index (BMI) 40.0 and over, adult: Secondary | ICD-10-CM | POA: Diagnosis not present

## 2021-07-02 DIAGNOSIS — I10 Essential (primary) hypertension: Secondary | ICD-10-CM | POA: Diagnosis not present

## 2021-07-02 DIAGNOSIS — D631 Anemia in chronic kidney disease: Secondary | ICD-10-CM | POA: Diagnosis present

## 2021-07-02 DIAGNOSIS — Z7189 Other specified counseling: Secondary | ICD-10-CM | POA: Diagnosis not present

## 2021-07-02 DIAGNOSIS — E662 Morbid (severe) obesity with alveolar hypoventilation: Secondary | ICD-10-CM | POA: Diagnosis present

## 2021-07-02 DIAGNOSIS — I272 Pulmonary hypertension, unspecified: Secondary | ICD-10-CM | POA: Diagnosis present

## 2021-07-02 LAB — PROTIME-INR
INR: 9.9 (ref 0.8–1.2)
Prothrombin Time: 79.2 seconds — ABNORMAL HIGH (ref 11.4–15.2)

## 2021-07-02 LAB — BRAIN NATRIURETIC PEPTIDE: B Natriuretic Peptide: 620.8 pg/mL — ABNORMAL HIGH (ref 0.0–100.0)

## 2021-07-02 LAB — TSH: TSH: 2.747 u[IU]/mL (ref 0.350–4.500)

## 2021-07-02 LAB — URINE CULTURE

## 2021-07-02 LAB — PHOSPHORUS: Phosphorus: 2.1 mg/dL — ABNORMAL LOW (ref 2.5–4.6)

## 2021-07-02 LAB — MAGNESIUM: Magnesium: 1.7 mg/dL (ref 1.7–2.4)

## 2021-07-02 LAB — SARS CORONAVIRUS 2 (TAT 6-24 HRS): SARS Coronavirus 2: NEGATIVE

## 2021-07-02 MED ORDER — SODIUM CHLORIDE 0.9 % IV SOLN
2.0000 g | INTRAVENOUS | Status: DC
  Administered 2021-07-03 – 2021-07-05 (×3): 2 g via INTRAVENOUS
  Filled 2021-07-02: qty 2
  Filled 2021-07-02: qty 20
  Filled 2021-07-02: qty 2

## 2021-07-02 MED ORDER — FUROSEMIDE 10 MG/ML IJ SOLN: 40.0000 mg | Freq: Every day | INTRAMUSCULAR | Status: DC

## 2021-07-02 MED ORDER — FUROSEMIDE 10 MG/ML IJ SOLN
40.0000 mg | Freq: Two times a day (BID) | INTRAMUSCULAR | Status: DC
  Administered 2021-07-02 – 2021-07-09 (×14): 40 mg via INTRAVENOUS
  Filled 2021-07-02 (×14): qty 4

## 2021-07-02 MED ORDER — PHYTONADIONE 5 MG PO TABS
2.5000 mg | ORAL_TABLET | Freq: Once | ORAL | Status: AC
  Administered 2021-07-02: 2.5 mg via ORAL
  Filled 2021-07-02: qty 1

## 2021-07-02 MED ORDER — ACETAMINOPHEN 325 MG PO TABS
650.0000 mg | ORAL_TABLET | Freq: Four times a day (QID) | ORAL | Status: DC | PRN
  Administered 2021-07-02 – 2021-07-28 (×19): 650 mg via ORAL
  Filled 2021-07-02 (×19): qty 2

## 2021-07-02 MED ORDER — ACETAMINOPHEN 650 MG RE SUPP: 650.0000 mg | Freq: Four times a day (QID) | RECTAL | Status: DC | PRN

## 2021-07-02 MED ORDER — HYDROMORPHONE HCL 1 MG/ML IJ SOLN
0.5000 mg | INTRAMUSCULAR | Status: DC | PRN
  Administered 2021-07-02 – 2021-07-03 (×4): 1 mg via INTRAVENOUS
  Filled 2021-07-02 (×4): qty 1

## 2021-07-02 MED ORDER — ONDANSETRON HCL 4 MG PO TABS: 4.0000 mg | ORAL_TABLET | Freq: Four times a day (QID) | ORAL | Status: DC | PRN

## 2021-07-02 MED ORDER — ONDANSETRON HCL 4 MG/2ML IJ SOLN: 4.0000 mg | Freq: Four times a day (QID) | INTRAMUSCULAR | Status: DC | PRN

## 2021-07-02 MED ORDER — SODIUM CHLORIDE 0.9 % IV SOLN
2.0000 g | Freq: Once | INTRAVENOUS | Status: AC
  Administered 2021-07-02: 2 g via INTRAVENOUS
  Filled 2021-07-02: qty 20

## 2021-07-02 MED ORDER — OXYCODONE HCL 5 MG PO TABS
5.0000 mg | ORAL_TABLET | ORAL | Status: DC | PRN
  Administered 2021-07-04 – 2021-07-14 (×14): 5 mg via ORAL
  Filled 2021-07-02 (×15): qty 1

## 2021-07-02 MED ORDER — FUROSEMIDE 10 MG/ML IJ SOLN
40.0000 mg | Freq: Once | INTRAMUSCULAR | Status: AC
  Administered 2021-07-02: 40 mg via INTRAVENOUS
  Filled 2021-07-02: qty 4

## 2021-07-02 NOTE — ED Provider Notes (Signed)
75 yo F with a chief complaints of fall about a week ago and some failure to thrive at home.  Was seen and was deemed to need placement.  I assumed care of the patient at 0 700.  Upon reviewing the patient's labs all that her INR had escalated.  When I went to examine the patient there was no concern of bleeding and so I will give 2.5 mg of oral vitamin K.  However that family was concerned about her left leg pain.  On my exam there is some erythema when compared to the other side and warmth.  Possibly cellulitis.  Will start on antibiotics.  The patient also was noted to be on oxygen.  Not on oxygen at home.  Family tells me that she was having quite a bit of difficulty breathing when she arrived.  Chest x-ray reviewed by me with pulmonary edema.  She has been taking her torsemide with some good urine output.  We will add a bolus of Lasix.  Will discuss with the hospitalist for possible admission.   Deno Etienne, DO 07/02/21 [phone]

## 2021-07-02 NOTE — H&P (Signed)
History and Physical    Katelyn Jackson ZHY:[id number] DOB: 08-23-1946 DOA: 07/01/2021  PCP: Cassandria Anger, MD  Patient coming from: home  I have personally briefly reviewed patient's old medical records in [place]  Chief Complaint: " My ankle, my knee and my left shoulder are hurting"  HPI: Katelyn Jackson is a 75 y.o. female with medical history significant of permanent A. fib-on Coumadin, hypertension, hyperlipidemia, status post pacemaker placement, history of breast cancer, hypothyroidism, chronic bilateral lymphedema, recurrent falls, obesity, GERD, pulmonary hypertension brought by his son to the ER for further evaluation of recent fall.  Patient's son at the bedside is the historian:.  He tells me that patient fell from the bed on Monday and since then she is complaining of left shoulder pain, left ankle pain and bilateral knee pain, she is unable to walk, stand, go to bathroom and progressively declining.  She needs full-time assistance for her daily life activities.  She has decreased appetite, and in constant pain.  She had a left shoulder surgery in May of this year.  She is compliant with her medications.  She has home aid who visit her 6 times in a week.  No history of head trauma, seizure-like episode, loss of consciousness, chest pain, worsening shortness of breath, worsening leg swelling, orthopnea, PND, cough, congestion, fever, chills, nausea, vomiting, has decreased urinary output since the fall due to poor appetite.  No history of smoking, alcohol, licit drug use.  She was placed on torsemide 60 mg once daily by her nephrology due to chronic lymphedema.  She lives with her son who takes care of her.  ED Course: Upon arrival to ED: Temperature 97.9, blood pressure 137/66, pulse: 99, respiratory rate 16, placed on 2 L of oxygen via nasal cannula, WBC of 13.2, H&H 10.1/32.2, platelet 107, creatinine: 1.99, BUN: 56, GFR: 26, INR 4.3 which trended up to 9.9 this  morning, chest x-ray shows pulmonary edema.  On exam she was found to have left lower extremity redness.  Patient was started on Rocephin, vitamin K 2.5 mg p.o. once Lasix 40 mg IV once given in the ED along with morphine and Zofran.  [facility] hospitalist consulted for admission for new onset oxygen requirement due to pulmonary edema, left lower extremity cellulitis and supratherapeutic INR.  Review of Systems: As per HPI otherwise negative.    Past Medical History:  Diagnosis Date   Arthritis    Cancer [facility])    breast cancer   Chronic atrial fibrillation (HCC)    Chronic kidney disease    sees Dr Florene Glen   Cramps, muscle, general    Dyspnea on exertion    Dysrhythmia    a-fib,    GERD (gastroesophageal reflux disease)    Headache    Hyperlipidemia    Hypertension    Hypothyroidism    Lymphedema    Moderate to severe pulmonary hypertension (HCC)    Obesity    Personal history of radiation therapy    Pneumonia 12/2020   Presence of permanent cardiac pacemaker 01/21/2021   for bradycardia    Syncope 12/2020   needed a pacemaker    Past Surgical History:  Procedure Laterality Date   APPENDECTOMY     BREAST BIOPSY Left 2018   BREAST LUMPECTOMY Left 04/04/2017   x3   BREAST LUMPECTOMY WITH NEEDLE LOCALIZATION AND AXILLARY SENTINEL LYMPH NODE BX Left 04/04/2017   Procedure: BREAST LUMPECTOMY WITH NEEDLE LOCALIZATION x3 AND AXILLARY SENTINEL LYMPH NODE BX;  Surgeon: Barry Dienes,  Dorris Fetch, MD;  Location: [place];  Service: General;  Laterality: Left;   CATARACT EXTRACTION W/ INTRAOCULAR LENS  IMPLANT, BILATERAL Bilateral 2018   COLONOSCOPY     EYE SURGERY     GLAUCOMA SURGERY Bilateral 2018   INSERT / REPLACE / REMOVE PACEMAKER  01/21/2021   RE-EXCISION OF BREAST CANCER,SUPERIOR MARGINS Left 04/26/2017   Procedure: RE-EXCISION OF LEFT BREAST CANCER;  Surgeon: [name], MD;  Location: [place];  Service: General;  Laterality: Left;   SHOULDER HEMI-ARTHROPLASTY Right 03/17/2021   Procedure:  SHOULDER HEMI-ARTHROPLASTY;  Surgeon: Hiram Gash, MD;  Location: [facility];  Service: Orthopedics;  Laterality: Right;     reports that she has never smoked. She has never used smokeless tobacco. She reports that she does not drink alcohol and does not use drugs.  Allergies  Allergen Reactions   Chlorhexidine Gluconate Hives   Penicillins Itching    Has patient had a PCN reaction causing immediate rash, facial/tongue/throat swelling, SOB or lightheadedness with hypotension: no Has patient had a PCN reaction causing severe rash involving mucus membranes or skin necrosis: No Has patient had a PCN reaction that required hospitalization: No Has patient had a PCN reaction occurring within the last 10 years: No If all of the above answers are "NO", then may proceed with Cephalosporin use.  Tolerated Cephalosporin Date: 03/17/21.     Family History  Problem Relation Age of Onset   CAD Mother 75   Stroke Mother 54       hemorr CVA   COPD Father 32   Cancer Neg Hx     Prior to Admission medications   Medication Sig Start Date End Date Taking? Authorizing Provider  allopurinol (ZYLOPRIM) 100 MG tablet Take 0.5 tablets (50 mg total) by mouth daily. Patient taking differently: Take 100 mg by mouth daily. 10/08/20  Yes Plotnikov, [name], MD  carvedilol (COREG) 25 MG tablet Take 1 tablet (25 mg total) by mouth 2 (two) times daily. 05/11/21 05/11/22 Yes Plotnikov, [name], MD  Cholecalciferol (VITAMIN D3) 2000 units capsule Take 1 capsule (2,000 Units total) by mouth daily. 07/11/17  Yes Plotnikov, [name], MD  Cyanocobalamin (VITAMIN B-12 PO) Take 2,000 mcg by mouth daily.   Yes [provider]  diclofenac Sodium (VOLTAREN) 1 % GEL APPLY FOUR GRAMS FOUR TIMES DAILY AS NEEDED Patient taking differently: Apply 4 g topically 4 (four) times daily as needed (pain). 06/11/21  Yes Plotnikov, [name], MD  exemestane (AROMASIN) 25 MG tablet Take 1 tablet (25 mg total) by mouth daily after  breakfast. 10/08/20  Yes Plotnikov, [name], MD  levothyroxine (SYNTHROID) 50 MCG tablet Take 1 tablet (50 mcg total) by mouth daily before breakfast. Overdue for Annual appt must see provider for future refills 02/09/21  Yes Plotnikov, [name], MD  lipase/protease/amylase (CREON) 12000-38000 units CPEP capsule Take 1 capsule (12,000 Units total) by mouth daily as needed (stomach problems). 10/08/20  Yes Plotnikov, [name], MD  lovastatin (MEVACOR) 20 MG tablet Take 1 tablet (20 mg total) by mouth daily. 10/08/20  Yes Plotnikov, [name], MD  magnesium oxide (MAG-OX) 400 MG tablet Take 400 mg by mouth 2 (two) times daily.   Yes [provider]  methocarbamol (ROBAXIN) 500 MG tablet Take 1 tablet (500 mg total) by mouth every 8 (eight) hours as needed for muscle spasms. 03/19/21  Yes McBane, Maylene Roes, PA-C  omeprazole (PRILOSEC) 40 MG capsule Take 1 capsule (40 mg total) by mouth daily. Overdue for  Annual appt must see provider for future refills Patient taking differently: Take 40 mg by mouth daily as needed (acid reflux). Overdue for Annual appt must see provider for future refills 10/08/20  Yes Plotnikov, [name], MD  OVER THE COUNTER MEDICATION Apply 1 application topically daily as needed (apply to buttocks  as needed for irritaiton). Intensive Skin Care Therapy   Yes [provider]  polyethylene glycol powder (GLYCOLAX/MIRALAX) powder mix 1 capful (17 grams) IN 8 ounces OF liquid EVERY DAY Patient taking differently: Take 17 g by mouth daily as needed for mild constipation. 11/05/18  Yes Plotnikov, [name], MD  potassium chloride (KLOR-CON) 8 MEQ tablet Take 1 tablet (8 mEq total) by mouth daily. 10/08/20  Yes Plotnikov, [name], MD  torsemide (DEMADEX) 20 MG tablet Take 60 mg every morning and 40 mg in the pm (2 pm) 05/11/21  Yes Plotnikov, [name], MD  traMADol (ULTRAM) 50 MG tablet Take 1 tablet (50 mg total) by mouth every 8 (eight) hours as needed for severe pain.  03/19/21  Yes McBane, Maylene Roes, PA-C  warfarin (COUMADIN) 5 MG tablet Take 1 tablet daily except take 1/2 tablet on Wed and Sat or Take as directed by anticoagulation clinic Patient taking differently: Take 2.5-5 mg by mouth See admin instructions. Taking 5 mg every day except on Tuesday, taking 2.5 mg daily 10/08/20  Yes Plotnikov, [name], MD  tamoxifen (NOLVADEX) 20 MG tablet Take 1 tablet (20 mg total) by mouth daily. Patient taking differently: Take 20 mg by mouth daily. Will take after finishing Exemestane 06/10/21   Truitt Merle, MD    Physical Exam: Vitals:   07/02/21 0530 07/02/21 0630 07/02/21 0734 07/02/21 0846  BP: 110/63 125/72 111/74 129/77  Pulse: 72 (!) 58 74 72  Resp: 13 15 15 16   Temp:      TempSrc:      SpO2: 95% 97% 100% 99%  Weight:      Height:        Constitutional: NAD, calm, moaning in pain, on nasal cannula, appears weak, sick Eyes: PERRL, lids and conjunctivae normal ENMT: Mucous membranes are moist. Posterior pharynx clear of any exudate or lesions.Normal dentition.  Neck: normal, supple, no masses, no thyromegaly Respiratory: clear to auscultation bilaterally, no wheezing, no crackles. Normal respiratory effort. No accessory muscle use.  Cardiovascular: Regular rate and rhythm, no murmurs / rubs / gallops. No extremity edema. 2+ pedal pulses. No carotid bruits.  Abdomen: no tenderness, no masses palpated. No hepatosplenomegaly. Bowel sounds positive.  Musculoskeletal:   Neurologic: CN 2-12 grossly intact. Sensation intact, DTR normal. Strength 5/5 in all 4.  Psychiatric: Normal judgment and insight. Alert and oriented x 3. Normal mood.    Labs on Admission: I have personally reviewed following labs and imaging studies  CBC: Recent Labs  Lab 07/01/21 1142  WBC 13.2*  NEUTROABS 11.7*  HGB 10.1*  HCT 32.2*  MCV 94.7  PLT 784*   Basic Metabolic Panel: Recent Labs  Lab 07/01/21 1142  NA 137  K 4.0  CL 100  CO2 27  GLUCOSE 91  BUN 56*   CREATININE 1.99*  CALCIUM 10.0   GFR: Estimated Creatinine Clearance: 28.5 mL/min (A) (by C-G formula based on SCr of 1.99 mg/dL (H)). Liver Function Tests: No results for input(s): AST, ALT, ALKPHOS, BILITOT, PROT, ALBUMIN in the last 168 hours. No results for input(s): LIPASE, AMYLASE in the last 168 hours. No results for input(s): AMMONIA in the last 168 hours.  Coagulation Profile: Recent Labs  Lab 07/01/21 1142 07/02/21 0410  INR 4.3* 9.9*   Cardiac Enzymes: No results for input(s): CKTOTAL, CKMB, CKMBINDEX, TROPONINI in the last 168 hours. BNP (last 3 results) No results for input(s): PROBNP in the last 8760 hours. HbA1C: No results for input(s): HGBA1C in the last 72 hours. CBG: No results for input(s): GLUCAP in the last 168 hours. Lipid Profile: No results for input(s): CHOL, HDL, LDLCALC, TRIG, CHOLHDL, LDLDIRECT in the last 72 hours. Thyroid Function Tests: No results for input(s): TSH, T4TOTAL, FREET4, T3FREE, THYROIDAB in the last 72 hours. Anemia Panel: No results for input(s): VITAMINB12, FOLATE, FERRITIN, TIBC, IRON, RETICCTPCT in the last 72 hours. Urine analysis:    Component Value Date/Time   COLORURINE YELLOW 07/01/2021 1439   APPEARANCEUR HAZY (A) 07/01/2021 1439   LABSPEC 1.012 07/01/2021 1439   PHURINE 5.0 07/01/2021 1439   GLUCOSEU NEGATIVE 07/01/2021 1439   GLUCOSEU NEGATIVE 07/11/2017 1108   HGBUR SMALL (A) 07/01/2021 1439   BILIRUBINUR NEGATIVE 07/01/2021 1439   KETONESUR NEGATIVE 07/01/2021 1439   PROTEINUR NEGATIVE 07/01/2021 1439   UROBILINOGEN 0.2 07/11/2017 1108   NITRITE NEGATIVE 07/01/2021 1439   LEUKOCYTESUR TRACE (A) 07/01/2021 1439    Radiological Exams on Admission: DG Chest 2 View  Result Date: 07/01/2021 CLINICAL DATA:  Fall EXAM: CHEST - 2 VIEW COMPARISON:  03/18/2021 FINDINGS: Cardiomegaly, unchanged from prior exam. Left chest cardiac device with leads in the right atrium and ventricle. Redemonstrated vascular congestion,  with interstitial prominence throughout the lungs, likely interstitial edema. No definite pleural effusion. No acute osseous abnormality. Status post right humeral head replacement. IMPRESSION: Cardiomegaly with pulmonary edema. Electronically Signed   By: Merilyn Baba M.D.   On: 07/01/2021 12:52   DG Ankle 2 Views Left  Result Date: 07/01/2021 CLINICAL DATA:  [place] last week twisting the ankle. Lymph edema with swelling of the entire leg. Unable to move leg. Pain. EXAM: LEFT ANKLE - 2 VIEW COMPARISON:  None. FINDINGS: Nonstandard positioning limits evaluation. Diffuse soft tissue swelling about the visualized left lower extremity. Prominent degenerative changes demonstrated in the left ankle and intertarsal joints. No acute displaced fractures are identified. No bone erosion or bone destruction. Plantar calcaneal spur. Vascular calcifications. IMPRESSION: Prominent degenerative changes in the left foot and ankle. No definite evidence of an acute fracture. Diffuse soft tissue swelling. Electronically Signed   By: Lucienne Capers M.D.   On: 07/01/2021 21:31   CT HEAD WO CONTRAST (5MM)  Result Date: 07/01/2021 CLINICAL DATA:  Altered mental status. EXAM: CT HEAD WITHOUT CONTRAST TECHNIQUE: Contiguous axial images were obtained from the base of the skull through the vertex without intravenous contrast. COMPARISON:  CT head dated January 05, 2021. FINDINGS: Brain: No evidence of acute infarction, hemorrhage, hydrocephalus, extra-axial collection or mass lesion/mass effect. Stable mild atrophy and chronic microvascular ischemic changes. Vascular: No hyperdense vessel or unexpected calcification. Skull: Normal. Negative for fracture or focal lesion. Sinuses/Orbits: No acute finding. Other: None. IMPRESSION: 1. No acute intracranial abnormality. Electronically Signed   By: Titus Dubin M.D.   On: 07/01/2021 13:01   DG Knee Complete 4 Views Left  Result Date: 07/01/2021 CLINICAL DATA:  Fall. EXAM: RIGHT KNEE -  COMPLETE 4+ VIEW; LEFT KNEE - COMPLETE 4+ VIEW COMPARISON:  None. FINDINGS: Left knee: No acute fracture or malalignment. No joint effusion. Tricompartmental joint space narrowing, moderate to severe in the medial and patellofemoral compartments, with large bulky marginal osteophytes. Soft tissues are unremarkable. Right knee: No acute  fracture or malalignment. Small joint effusion. Severe tricompartmental joint space narrowing with bone-on-bone apposition in the medial compartment. Bulky marginal osteophytes. Soft tissues are unremarkable. IMPRESSION: 1. No acute osseous abnormality. 2. Severe right and moderate to severe left knee osteoarthritis. Electronically Signed   By: Titus Dubin M.D.   On: 07/01/2021 12:48   DG Knee Complete 4 Views Right  Result Date: 07/01/2021 CLINICAL DATA:  Fall. EXAM: RIGHT KNEE - COMPLETE 4+ VIEW; LEFT KNEE - COMPLETE 4+ VIEW COMPARISON:  None. FINDINGS: Left knee: No acute fracture or malalignment. No joint effusion. Tricompartmental joint space narrowing, moderate to severe in the medial and patellofemoral compartments, with large bulky marginal osteophytes. Soft tissues are unremarkable. Right knee: No acute fracture or malalignment. Small joint effusion. Severe tricompartmental joint space narrowing with bone-on-bone apposition in the medial compartment. Bulky marginal osteophytes. Soft tissues are unremarkable. IMPRESSION: 1. No acute osseous abnormality. 2. Severe right and moderate to severe left knee osteoarthritis. Electronically Signed   By: Titus Dubin M.D.   On: 07/01/2021 12:48     Assessment/Plan Principal Problem:   Cellulitis of left lower extremity Active Problems:   Essential hypertension   Chronic atrial fibrillation (HCC)   Lymphedema   Anemia of chronic disease   CRF (chronic renal failure), stage 4 (severe) (HCC)   Morbid obesity (HCC)   GERD (gastroesophageal reflux disease)   Hypothyroidism   Supratherapeutic INR   Pulmonary edema     Cellulitis of left lower extremity: -In the setting of chronic lymphedema. -She is afebrile with leukocytosis of 13.2.  Was given Rocephin in the ED. -Admit patient on the floor.  On telemetry -Continue Rocephin.  Check blood culture -Monitor vitals -Tylenol, Dilaudid as needed for pain control  Recurrent falls: -Polypharmacy versus physical deconditioning due to underlying chronic medical issues -CT head negative for acute findings. -X-ray left ankle: Degenerative changes and soft tissue swelling.  No fracture or dislocation.  Knee x-ray, severe right and moderate to severe left osteoarthritis -Consult PT/OT -As needed pain medications -May need nursing home at the time of discharge TOC consulted  Supratherapeutic INR: -No signs of active bleeding -Vitamin K 2.5 mg p.o. once given -Hold Coumadin at this time.  Monitor INR closely -Close monitoring for signs of bleeding  Acute hypoxemic respiratory failure in the setting of fluid overload/pulmonary edema in the setting of acute on chronic systolic CHF: -Currently requiring 2 L of oxygen -Recent echo shows ejection fraction of 45 to 50% with indeterminate diastolic parameters. -Was given Lasix 40 IV once in ED. check BNP -Chest x-ray shows cardiomegaly with pulmonary edema -Start Lasix 40 IV twice daily  -Strict INO's and daily weight.  On continuous pulse ox we will try to wean off of oxygen as tolerated.  Monitor kidney function and electrolytes closely  AKI on CKD stage IIIb: -Baseline creatinine: 1.4-1.6 -Creatinine 1.9, GFR 26.  Likely due to poor p.o. intake -Avoid nephrotoxic medications, monitor renal function closely.  Permanent A. fib: Status post pacemaker placement -Hold Coumadin in the setting of supratherapeutic INR -Monitor INR closely.   Hypertension: Stable -Continue Coreg  History of breast cancer: -Hold Aromasin at this time due to elevated INR  Anemia of chronic disease: -H&H 10.1/32.2-at  baseline -Monitor H&H closely in the setting of supratherapeutic INR.  Thrombocytopenia: Chronic -Platelet 107.  Continue to monitor  Hyperlipidemia: Continue statin  GERD: Continue PPI  Hypothyroidism: Check TSH.  Continue levothyroxine  Skin ulcer in groin: -Consult wound care.  She has overall  poor outcome. May need palliative care consult to discuss goals of care with patient's family  DVT prophylaxis: None due to supratherapeutic INR Code Status: Full code-confirmed with patient's son Family Communication: Patient's son present at bedside.  Plan of care discussed with patient in length and he verbalized understanding and agreed with it. Disposition Plan: Likely SNF Consults called: None Admission status: Inpatient   Mckinley Jewel MD [facility]  If 7PM-7AM, please contact night-coverage [email or web address]  07/02/2021, 9:51 AM

## 2021-07-02 NOTE — Evaluation (Signed)
Physical Therapy Evaluation Patient Details Name: Katelyn Jackson MRN: [id number] DOB: [birth year] Today's Date: 07/02/2021   History of Present Illness  Katelyn Jackson is a 75 y.o. female brought by son to the ER for further evaluation of recent fall. Patient's son at the bedside is the historian:.  He tells me that patient fell from the bed on Monday and since then she is complaining of left shoulder pain, left ankle pain and bilateral knee pain, she is unable to walk, stand, go to bathroom and progressively declining. PMH: permanent A. fib, HTN, hyperlipidemia, s/p pacemaker placement, breast cancer, hypothyroidism, chronic bilateral lymphedema, recurrent falls, obesity, GERD, pulmonary hypertension   Clinical Impression  Pt admitted with above diagnosis. Pt's son states prior to Monday 8/22 pt was ambulating with rollator walker in the home, aide or son/daughter-in-law assist pt with bathing and dressing, pt able to toilet independently, but now unable to ambulate. Pt's son states just received w/c but pt hasn't used it yet because has been in hospital. Pt's son interpreting throughout, pt very vocal about pain, moaning and yelling out reporting pain in bil knees, L ankle and R shoulder. Pt requires 3+ assist to roll in bed to change soiled linen, unable to assist with rolling using BUE. Pt will need 24 hr assist, unsure if SNF or HHPT appropriate, will depend on progress with therapy and pain control. Recommending OT eval due to R shoulder surgery and current BUE weakness noted. Pt currently with functional limitations due to the deficits listed below (see PT Problem List). Pt will benefit from skilled PT to increase their independence and safety with mobility to allow discharge to the venue listed below.       Follow Up Recommendations Supervision/Assistance - 24 hour (SNF vs HHPT pending progress with therapy and pain control)    Equipment Recommendations  None recommended by PT     Recommendations for Other Services OT consult     Precautions / Restrictions Precautions Precautions: Fall Precaution Comments: high pain Restrictions Weight Bearing Restrictions: No      Mobility  Bed Mobility Overal bed mobility: Needs Assistance Bed Mobility: Rolling Rolling: Total assist  General bed mobility comments: +3 to roll supine to R/L to change soiled linen under pt, pt unable to reach for opposite bedrail, vocal regarding pain with mobility in bil knees, L ankle and R shoulder    Transfers  General transfer comment: unable  Ambulation/Gait   General Gait Details: unable  Stairs            Wheelchair Mobility    Modified Rankin (Stroke Patients Only)       Balance                Pertinent Vitals/Pain Pain Assessment: 0-10 Pain Score: 10-Worst pain ever ("11") Pain Location: bil knees, L ankle and R shoulder Pain Descriptors / Indicators: Grimacing;Moaning;Guarding Pain Intervention(s): Limited activity within patient's tolerance;Monitored during session;Repositioned;RN gave pain meds during session    Home Living Family/patient expects to be discharged to:: Private residence Living Arrangements: Children;Other relatives Available Help at Discharge: Family;Personal care attendant Type of Home: House (townhome) Home Access: Level entry     Home Layout: Multi-level;Able to live on main level with bedroom/bathroom Home Equipment: Gilford Rile - 2 wheels;Walker - 4 wheels;Bedside commode;Grab bars - tub/shower;Tub bench;Wheelchair - manual      Prior Function Level of Independence: Needs assistance   Gait / Transfers Assistance Needed: Pt's son reports pt ambulates with [phone]  ADL's / [place]  Needed: Pt's son reports aide comes daily 2-3 hrs assist with bathing, dressing, in/out of bed. has difficulty lifting legs. can toilet herself; son and his spouse assist pt as needed        Hand Dominance   Dominant Hand: Right     Extremity/Trunk Assessment   Upper Extremity Assessment Upper Extremity Assessment: Generalized weakness    Lower Extremity Assessment Lower Extremity Assessment: Generalized weakness;RLE deficits/detail;LLE deficits/detail RLE Deficits / Details: able to wiggle toes, active DF/PF ~25%, active knee flexion ~10 deg, limited by pain, significant lymphedema RLE: Unable to fully assess due to pain LLE Deficits / Details: able to wiggle toes, active DF/PF ~25%, no active knee flexion, limited by pain, significant lymphedema LLE: Unable to fully assess due to pain       Communication   Communication: Prefers language other than English  Cognition Arousal/Alertness: Awake/alert Behavior During Therapy: WFL for tasks assessed/performed Overall Cognitive Status: Difficult to assess  General Comments: Pt's son in room providing interpretation, responds to name appropriately, answers questions and follows commands appropriately      General Comments      Exercises     Assessment/Plan    PT Assessment Patient needs continued PT services  PT Problem List Decreased strength;Decreased range of motion;Decreased activity tolerance;Decreased balance;Decreased mobility;Cardiopulmonary status limiting activity;Obesity;Decreased skin integrity;Pain       PT Treatment Interventions DME instruction;Gait training;Functional mobility training;Therapeutic activities;Therapeutic exercise;Balance training;Neuromuscular re-education;Patient/family education;Wheelchair mobility training    PT Goals (Current goals can be found in the Care Plan section)  Acute Rehab PT Goals Patient Stated Goal: bring pt home PT Goal Formulation: With family Time For Goal Achievement: 07/16/21 Potential to Achieve Goals: Fair    Frequency Min 2X/week   Barriers to discharge        Co-evaluation               AM-PAC PT "6 Clicks" Mobility  Outcome Measure Help needed turning from your back to your side  while in a flat bed without using bedrails?: Total Help needed moving from lying on your back to sitting on the side of a flat bed without using bedrails?: Total Help needed moving to and from a bed to a chair (including a wheelchair)?: Total Help needed standing up from a chair using your arms (e.g., wheelchair or bedside chair)?: Total Help needed to walk in hospital room?: Total Help needed climbing 3-5 steps with a railing? : Total 6 Click Score: 6    End of Session   Activity Tolerance: Patient limited by pain Patient left: in bed;with call bell/phone within reach;with nursing/sitter in room;with family/visitor present Nurse Communication: Mobility status PT Visit Diagnosis: Other abnormalities of gait and mobility (R26.89);Muscle weakness (generalized) (M62.81)    Time: 8546-2703 PT Time Calculation (min) (ACUTE ONLY): 42 min   Charges:   PT Evaluation $PT Eval Moderate Complexity: 1 Mod PT Treatments $Therapeutic Activity: 23-37 mins        Tori Tahani Potier PT, DPT 07/02/21, 12:55 PM

## 2021-07-02 NOTE — Progress Notes (Signed)
[facility] for  warfarin Indication:  hx atrial fibrillation  Allergies  Allergen Reactions   Chlorhexidine Gluconate Hives   Penicillins Itching    Has patient had a PCN reaction causing immediate rash, facial/tongue/throat swelling, SOB or lightheadedness with hypotension: no Has patient had a PCN reaction causing severe rash involving mucus membranes or skin necrosis: No Has patient had a PCN reaction that required hospitalization: No Has patient had a PCN reaction occurring within the last 10 years: No If all of the above answers are "NO", then may proceed with Cephalosporin use.  Tolerated Cephalosporin Date: 03/17/21.     Patient Measurements: Height: 5\' 1"  (154.9 cm) Weight: 113.4 kg (250 lb) IBW/kg (Calculated) : 47.8 Heparin Dosing Weight:   Vital Signs: BP: 129/77 (08/26 0846) Pulse Rate: 72 (08/26 0846)  Labs: Recent Labs    07/01/21 1142 07/02/21 0410  HGB 10.1*  --   HCT 32.2*  --   PLT 107*  --   LABPROT 41.2* 79.2*  INR 4.3* 9.9*  CREATININE 1.99*  --     Estimated Creatinine Clearance: 28.5 mL/min (A) (by C-G formula based on SCr of 1.99 mg/dL (H)).   Medications:  - on warfarin 2.5mg  daily except 5mg  on Tue PTA (last dose taken on 8/24)  Assessment: Patient is a 75 y.o F with hx afib on warfarin PTA, presented to the ED on 8/25 with failure to thrive, She also reported of a fall one week ago. Head CT was negative for any acute abnormality.  Pharmacy has been consulted to dose warfarin while she's hospitalized.  Today, 07/02/2021: - INR is elevated and increased from 4.3 to 9.9 this morning.  Vit K 2.5 mg PO x1 ordered. Of note, if patient  has poor oral intake or has poor nutritional status, this can make her more sensitive to warfarin - hgb 10.1 and plts 107K on 8/25 - no bleeding documented - drug-drug intxns: Being on abx (currently on ceftriaxone) can make her more sensitive to warfarin   Goal of  Therapy:  INR 2-3 Monitor platelets by anticoagulation protocol: Yes   Plan:  - hold warfarin for now d/t supratherapeutic INR - daily INR - monitor fir s/sx bleeding  Jeny Nield P 07/02/2021,11:41 AM

## 2021-07-03 DIAGNOSIS — L8990 Pressure ulcer of unspecified site, unspecified stage: Secondary | ICD-10-CM | POA: Insufficient documentation

## 2021-07-03 DIAGNOSIS — L03116 Cellulitis of left lower limb: Secondary | ICD-10-CM | POA: Diagnosis not present

## 2021-07-03 LAB — COMPREHENSIVE METABOLIC PANEL WITH GFR
ALT: 14 U/L (ref 0–44)
AST: 13 U/L — ABNORMAL LOW (ref 15–41)
Albumin: 2.7 g/dL — ABNORMAL LOW (ref 3.5–5.0)
Alkaline Phosphatase: 140 U/L — ABNORMAL HIGH (ref 38–126)
Anion gap: 10 (ref 5–15)
BUN: 58 mg/dL — ABNORMAL HIGH (ref 8–23)
CO2: 31 mmol/L (ref 22–32)
Calcium: 10.3 mg/dL (ref 8.9–10.3)
Chloride: 102 mmol/L (ref 98–111)
Creatinine, Ser: 1.79 mg/dL — ABNORMAL HIGH (ref 0.44–1.00)
GFR, Estimated: 29 mL/min — ABNORMAL LOW
Glucose, Bld: 104 mg/dL — ABNORMAL HIGH (ref 70–99)
Potassium: 3.7 mmol/L (ref 3.5–5.1)
Sodium: 143 mmol/L (ref 135–145)
Total Bilirubin: 3.6 mg/dL — ABNORMAL HIGH (ref 0.3–1.2)
Total Protein: 7.1 g/dL (ref 6.5–8.1)

## 2021-07-03 LAB — PROTIME-INR
INR: 4.7 (ref 0.8–1.2)
Prothrombin Time: 44.1 s — ABNORMAL HIGH (ref 11.4–15.2)

## 2021-07-03 LAB — BLOOD GAS, ARTERIAL
Acid-Base Excess: 4.4 mmol/L — ABNORMAL HIGH (ref 0.0–2.0)
Bicarbonate: 29.7 mmol/L — ABNORMAL HIGH (ref 20.0–28.0)
FIO2: 21
O2 Saturation: 90.9 %
Patient temperature: 98.6
pCO2 arterial: 49.8 mmHg — ABNORMAL HIGH (ref 32.0–48.0)
pH, Arterial: 7.392 (ref 7.350–7.450)
pO2, Arterial: 64.4 mmHg — ABNORMAL LOW (ref 83.0–108.0)

## 2021-07-03 LAB — CBC
HCT: 34.2 % — ABNORMAL LOW (ref 36.0–46.0)
Hemoglobin: 10.4 g/dL — ABNORMAL LOW (ref 12.0–15.0)
MCH: 29.6 pg (ref 26.0–34.0)
MCHC: 30.4 g/dL (ref 30.0–36.0)
MCV: 97.4 fL (ref 80.0–100.0)
Platelets: 113 10*3/uL — ABNORMAL LOW (ref 150–400)
RBC: 3.51 MIL/uL — ABNORMAL LOW (ref 3.87–5.11)
RDW: 17 % — ABNORMAL HIGH (ref 11.5–15.5)
WBC: 10.7 10*3/uL — ABNORMAL HIGH (ref 4.0–10.5)
nRBC: 0 % (ref 0.0–0.2)

## 2021-07-03 MED ORDER — K PHOS MONO-SOD PHOS DI & MONO 155-852-130 MG PO TABS
250.0000 mg | ORAL_TABLET | Freq: Two times a day (BID) | ORAL | Status: AC
  Administered 2021-07-03 – 2021-07-05 (×6): 250 mg via ORAL
  Filled 2021-07-03 (×6): qty 1

## 2021-07-03 MED ORDER — POLYVINYL ALCOHOL 1.4 % OP SOLN
1.0000 [drp] | OPHTHALMIC | Status: DC | PRN
  Administered 2021-07-04 – 2021-07-09 (×4): 1 [drp] via OPHTHALMIC
  Filled 2021-07-03: qty 15

## 2021-07-03 NOTE — Evaluation (Signed)
Clinical/Bedside Swallow Evaluation Patient Details  Name: Katelyn Jackson MRN: [id number] Date of Birth: 08-03-1946  Today's Date: 07/03/2021 Time: SLP Start Time (ACUTE ONLY): 90 SLP Stop Time (ACUTE ONLY): 1450 SLP Time Calculation (min) (ACUTE ONLY): 15 min  Past Medical History:  Past Medical History:  Diagnosis Date   Arthritis    Cancer ([place])    breast cancer   Chronic atrial fibrillation ([facility])    Chronic kidney disease    sees Dr Florene Glen   Cramps, muscle, general    Dyspnea on exertion    Dysrhythmia    a-fib,    GERD (gastroesophageal reflux disease)    Headache    Hyperlipidemia    Hypertension    Hypothyroidism    Lymphedema    Moderate to severe pulmonary hypertension ([place])    Obesity    Personal history of radiation therapy    Pneumonia 12/2020   Presence of permanent cardiac pacemaker 01/21/2021   for bradycardia    Syncope 12/2020   needed a pacemaker   Past Surgical History:  Past Surgical History:  Procedure Laterality Date   APPENDECTOMY     BREAST BIOPSY Left 2018   BREAST LUMPECTOMY Left 04/04/2017   x3   BREAST LUMPECTOMY WITH NEEDLE LOCALIZATION AND AXILLARY SENTINEL LYMPH NODE BX Left 04/04/2017   Procedure: BREAST LUMPECTOMY WITH NEEDLE LOCALIZATION x3 AND AXILLARY SENTINEL LYMPH NODE BX;  Surgeon: [name], MD;  Location: [facility];  Service: General;  Laterality: Left;   CATARACT EXTRACTION W/ INTRAOCULAR LENS  IMPLANT, BILATERAL Bilateral 2018   COLONOSCOPY     EYE SURGERY     GLAUCOMA SURGERY Bilateral 2018   INSERT / REPLACE / REMOVE PACEMAKER  01/21/2021   RE-EXCISION OF BREAST CANCER,SUPERIOR MARGINS Left 04/26/2017   Procedure: RE-EXCISION OF LEFT BREAST CANCER;  Surgeon: [name], MD;  Location: [facility];  Service: General;  Laterality: Left;   SHOULDER HEMI-ARTHROPLASTY Right 03/17/2021   Procedure: SHOULDER HEMI-ARTHROPLASTY;  Surgeon: Hiram Gash, MD;  Location: [facility];  Service: Orthopedics;  Laterality: Right;   HPI:   75 year old female with multiple history including permanent A. fib on Coumadin, HTN, HLD, pacemaker in place, history of breast cancer, hypothyroidism, chronic bilateral lymphedema, recurrent fall, morbid obesity BMI 51, GERD, pulmonary hypertension , who needs full assistance for her daily life activities, has aid 6x/wk at home, brought to the ED by son for evaluation of recent fall on last Monday.Patient fell from bed on Monday since then complaining of left shoulder pain, left ankle pain and bilateral knee pain and unable to walk or stand go to bathroom and progressively declining   Assessment / Plan / Recommendation Clinical Impression  Pts oropharyngeal swallow appears grossly intact. Pt with hx of GERD, suspect some esophageal dysphagia. Pts family present, assisted with interpreting. Performed oral care, pt with some lingual coating; RN to request chlorihexidine to reduce lingual coating. Per RN report, PO intake has been reduced due to pt displeasure for hospital menu items and lack of culturally approriate foods that she is used to eating at baseline. No overt s/sx of aspiration were exhibited with any PO this date. Continue regular thin liquid diet with meds as tolerated. No further ST needs identified. SLP Visit Diagnosis: Dysphagia, unspecified (R13.10)    Aspiration Risk  Mild aspiration risk    Diet Recommendation   Regular, thin liquids   Medication Administration: Whole meds with liquid    Other  Recommendations Oral Care Recommendations: Oral care BID  Follow up Recommendations Other (comment) (per PT/OT recs)      Frequency and Duration   N/a         Prognosis        Swallow Study   General Date of Onset: 07/02/21 HPI: 75 year old female with multiple history including permanent A. fib on Coumadin, HTN, HLD, pacemaker in place, history of breast cancer, hypothyroidism, chronic bilateral lymphedema, recurrent fall, morbid obesity BMI 51, GERD, pulmonary hypertension  , who needs full assistance for her daily life activities, has aid 6x/wk at home, brought to the ED by son for evaluation of recent fall on last Monday.Patient fell from bed on Monday since then complaining of left shoulder pain, left ankle pain and bilateral knee pain and unable to walk or stand go to bathroom and progressively declining Type of Study: Bedside Swallow Evaluation Previous Swallow Assessment: none on file Diet Prior to this Study: Regular;Thin liquids Temperature Spikes Noted: No Respiratory Status: Room air History of Recent Intubation: No Behavior/Cognition: Alert;Pleasant mood;Cooperative Oral Cavity Assessment: Within Functional Limits Oral Care Completed by SLP: Yes Oral Cavity - Dentition: Dentures, not available (son to bring upper and lower dentures) Vision: Functional for self-feeding Self-Feeding Abilities: Needs assist Patient Positioning: Upright in bed Baseline Vocal Quality: Normal Volitional Swallow: Able to elicit    Oral/Motor/Sensory Function Overall Oral Motor/Sensory Function: Within functional limits   Ice Chips Ice chips: Within functional limits Presentation: Spoon   Thin Liquid Thin Liquid: Within functional limits Presentation: Cup;Straw    Nectar Thick Nectar Thick Liquid: Not tested   Honey Thick Honey Thick Liquid: Not tested   Puree Puree: Within functional limits Presentation: Self Fed   Solid     Solid: Within functional limits Presentation: Self Fed      Hayden Rasmussen MA, CCC-SLP Acute Rehabilitation Services   07/03/2021,3:02 PM

## 2021-07-03 NOTE — Plan of Care (Signed)
  Problem: Clinical Measurements: Goal: Will remain free from infection Outcome: Progressing   Problem: Pain Managment: Goal: General experience of comfort will improve Outcome: Progressing   Problem: Skin Integrity: Goal: Risk for impaired skin integrity will decrease Outcome: Progressing   

## 2021-07-03 NOTE — Progress Notes (Signed)
PROGRESS NOTE    Katelyn Jackson  ALP:[id number] DOB: 05/09/1946 DOA: 07/01/2021 PCP: Cassandria Anger, MD   Chief Complaint  Patient presents with   Dysuria   Brief Narrative: 75 year old female with multiple history including permanent A. fib on Coumadin, HTN, HLD, pacemaker in place, history of breast cancer, hypothyroidism, chronic bilateral lymphedema, recurrent fall, morbid obesity BMI 51, GERD, pulmonary hypertension , who needs full assistance for her daily life activities, has aid 6x/wk at home, brought to the ED by son for evaluation of recent fall on last Monday.Patient fell from bed on Monday since then complaining of left shoulder pain, left ankle pain and bilateral knee pain and unable to walk or stand go to bathroom and progressively declining.  Per admitting: Temperature 97.9, blood pressure 137/66, pulse: 99, respiratory rate 16, placed on 2 L of oxygen via nasal cannula, WBC of 13.2, H&H 10.1/32.2, platelet 107, creatinine: 1.99, BUN: 56, GFR: 26, INR 4.3 which trended up to 9.9 this morning, chest x-ray shows pulmonary edema.  On exam she was found to have left lower extremity redness.  Patient was started on Rocephin, vitamin K 2.5 mg p.o. once Lasix 40 mg IV once given in the ED along with morphine and Zofran.  [facility] consulted for admission for new onset oxygen requirement due to pulmonary edema, left lower extremity cellulitis and supratherapeutic INR  Subjective: Seen this am On 2l [place]. Legs are swollen and tender with some areas of erythema Wbc improving. C/o pain on her both knees and let ankle- chronic  Assessment & Plan:  Cellulitis of left lower extremity Chronic bilateral lymphedema Cont iv antibiotics, suspect secondary bacterial infection due to swelling. Tender and mild erythema scattered in legs.on rocephin and wbc improving, cont pain control  Morbid Obesity w./ BMI 51 Recurrent falls Ambulatory dysfunction Severe right and moderate to  severe left knee ostearthritis and having pain Left ankle pain: Had xray on knees and let ankle in ED. Cont pain control, tramadol at home, here on opiates, add tylenol scheduled. Cont muscle relanaxt  Mild confusion per son- CT head no acute findings. Checked abg-compensated mild hypercapnia PCO2 49.[name] PTOT supportive care.  Acute hypoxic respiratory failure Fluid overload/pulmonary edema Acute on chronic systolic CHF-EF 35-32%, bnp 620 Chronic hypercapnia compensated, likely from OSA/OHS Placed on IV Lasix twice daily monitor intake output Daily weight, supplemental oxygen. Filed Weights   07/01/21 1128 07/03/21 0500  Weight: 113.4 kg 124.6 kg  Net IO Since Admission: -310 mL [07/03/21 0953]   AKI on CKD stage IIIb, baseline creatinine 1.4-1.6.  improving, tolerating lasix, cont to monitor. Recent Labs    03/01/21 0850 03/17/21 1703 03/18/21 0324 03/19/21 0335 03/20/21 0825 06/10/21 0906 07/01/21 1142 07/03/21 0541  BUN 16 17 18 22 22  30* 56* 58*  CREATININE 1.46* 1.67* 1.74* 1.84* 1.59* 1.85* 1.99* 1.79*    Essential hypertension: Bp controlled, cont home coreg  Hypophosphatemia- add k phos repeat.  Chronic atrial fibrillation chronic Coumadin Supratherapeutic INR Pacemaker in place S/p vitamin k x1, fu inr coming down.rate stable. Recent Labs  Lab 07/01/21 1142 07/02/21 0410 07/03/21 0541  INR 4.3* 9.9* 4.7*    Anemia of chronic disease: Baseline H&H 10.1, stable Recent Labs  Lab 07/01/21 1142 07/03/21 0541  HGB 10.1* 10.4*  HCT 32.2* 34.2*    Hypothyroidism: Continue Synthroid.  Follow-up TSH Thrombocytopenia-monitor Recent Labs  Lab 07/01/21 1142 07/03/21 0541  PLT 107* 113*    HLD on statin GERD on PPI History of breast cancer  Pressure injury of skin POA SEE BELOW Diet Order             Diet 2 gram sodium Room service appropriate? Yes; Fluid consistency: Thin  Diet effective now                  Patient's Body mass index is 51.9  kg/m.  Pressure Injury 07/03/21 Thigh Posterior;Right Stage 3 -  Full thickness tissue loss. Subcutaneous fat may be visible but bone, tendon or muscle are NOT exposed. (Active)  07/03/21 0453  Location: Thigh  Location Orientation: Posterior;Right  Staging: Stage 3 -  Full thickness tissue loss. Subcutaneous fat may be visible but bone, tendon or muscle are NOT exposed.  Wound Description (Comments):   Present on Admission: Yes    DVT prophylaxis: SCDs Start: 07/02/21 0948, COUMADIN Code Status:   Code Status: Full Code  Family Communication: plan of care discussed with patient at bedside. Status is: Inpatient Remains inpatient appropriate because:Unsafe d/c plan, IV treatments appropriate due to intensity of illness or inability to take PO, and Inpatient level of care appropriate due to severity of illness Dispo: The patient is from: Home              Anticipated d/c is to:  TBD              Patient currently is not medically stable to d/c.   Difficult to place patient No Unresulted Labs (From admission, onward)     Start     Ordered   07/04/21 0500  Protime-INR  Daily,   R      07/02/21 1153   07/04/21 0500  Comprehensive metabolic panel  Daily,   R     Question:  Specimen collection method  Answer:  Lab=Lab collect   07/03/21 0948   07/04/21 0500  CBC  Daily,   R     Question:  Specimen collection method  Answer:  Lab=Lab collect   07/03/21 0948   07/03/21 0947  Blood gas, arterial  Once,   R        07/03/21 0947           Medications reviewed:  Scheduled Meds:  carvedilol  25 mg Oral BID   cholecalciferol  2,000 Units Oral Daily   exemestane  25 mg Oral QPC breakfast   furosemide  40 mg Intravenous BID   levothyroxine  50 mcg Oral QAC breakfast   magnesium oxide  400 mg Oral BID   pantoprazole  40 mg Oral Daily   potassium chloride  10 mEq Oral Daily   pravastatin  20 mg Oral q1800   vitamin B-12  2,000 mcg Oral Daily   Continuous Infusions:  cefTRIAXone  (ROCEPHIN)  IV     Consultants:see note  Procedures:see note Antimicrobials: Anti-infectives (From admission, onward)    Start     Dose/Rate Route Frequency Ordered Stop   07/03/21 0900  cefTRIAXone (ROCEPHIN) 2 g in sodium chloride 0.9 % 100 mL IVPB        2 g 200 mL/hr over 30 Minutes Intravenous Every 24 hours 07/02/21 0950 07/09/21 0859   07/02/21 0915  cefTRIAXone (ROCEPHIN) 2 g in sodium chloride 0.9 % 100 mL IVPB        2 g 200 mL/hr over 30 Minutes Intravenous  Once 07/02/21 4970 07/02/21 1033      Culture/Microbiology    Component Value Date/Time   SDES  07/01/2021 1439    URINE, CLEAN CATCH Performed  at [facility], [place] [address], [place], [place] [address]    SPECREQUEST  07/01/2021 1439    NONE Performed at [facility], [place] [address], [place], [place] [address]    CULT MULTIPLE SPECIES PRESENT, SUGGEST RECOLLECTION (A) 07/01/2021 1439   REPTSTATUS 07/02/2021 FINAL 07/01/2021 1439    Other culture-see note  Objective: Vitals: Today's Vitals   07/03/21 0414 07/03/21 0444 07/03/21 0450 07/03/21 0500  BP:   131/76   Pulse:   64   Resp:   16   Temp:      TempSrc:      SpO2:      Weight:    124.6 kg  Height:      PainSc: 9  Asleep      Intake/Output Summary (Last 24 hours) at 07/03/2021 0953 Last data filed at 07/03/2021 0935 Gross per 24 hour  Intake 240 ml  Output 550 ml  Net -310 ml   Filed Weights   07/01/21 1128 07/03/21 0500  Weight: 113.4 kg 124.6 kg   Weight change: 11.2 kg  Intake/Output from previous day: No intake/output data recorded. Intake/Output this shift: Total I/O In: 240 [P.O.:240] Out: 550 [Urine:550] Filed Weights   07/01/21 1128 07/03/21 0500  Weight: 113.4 kg 124.6 kg   Examination: General exam: AAO  to self, place, mild confusion,obese, in pain,on 2l [place]. HEENT:Oral mucosa moist, Ear/Nose WNL grossly,dentition normal. Respiratory system: bilaterally diminished, no use of  accessory muscle, non tender. Cardiovascular system: S1 & S2 +,No JVD. Gastrointestinal system: Abdomen soft, NT,ND, BS+. Nervous System:Alert, awake, moving extremities Extremities: LE significant edema lymphedema distal peripheral pulses palpable.  Rt shoulder area with healed scar from recent surgery Skin: No rashes,no icterus. MSK: obese muscle bulk,tone, power  Data Reviewed: I have personally reviewed following labs and imaging studies CBC: Recent Labs  Lab 07/01/21 1142 07/03/21 0541  WBC 13.2* 10.7*  NEUTROABS 11.7*  --   HGB 10.1* 10.4*  HCT 32.2* 34.2*  MCV 94.7 97.4  PLT 107* 194*   Basic Metabolic Panel: Recent Labs  Lab 07/01/21 1142 07/02/21 0949 07/03/21 0541  NA 137  --  143  K 4.0  --  3.7  CL 100  --  102  CO2 27  --  31  GLUCOSE 91  --  104*  BUN 56*  --  58*  CREATININE 1.99*  --  1.79*  CALCIUM 10.0  --  10.3  MG  --  1.7  --   PHOS  --  2.1*  --    GFR: Estimated Creatinine Clearance: 33.7 mL/min (A) (by C-G formula based on SCr of 1.79 mg/dL (H)). Liver Function Tests: Recent Labs  Lab 07/03/21 0541  AST 13*  ALT 14  ALKPHOS 140*  BILITOT 3.6*  PROT 7.1  ALBUMIN 2.7*   No results for input(s): LIPASE, AMYLASE in the last 168 hours. No results for input(s): AMMONIA in the last 168 hours. Coagulation Profile: Recent Labs  Lab 07/01/21 1142 07/02/21 0410 07/03/21 0541  INR 4.3* 9.9* 4.7*   Cardiac Enzymes: No results for input(s): CKTOTAL, CKMB, CKMBINDEX, TROPONINI in the last 168 hours. BNP (last 3 results) No results for input(s): PROBNP in the last 8760 hours. HbA1C: No results for input(s): HGBA1C in the last 72 hours. CBG: No results for input(s): GLUCAP in the last 168 hours. Lipid Profile: No results for input(s): CHOL, HDL, LDLCALC, TRIG, CHOLHDL, LDLDIRECT in the last 72 hours. Thyroid Function Tests: Recent Labs  07/02/21 1235  TSH 2.747   Anemia Panel: No results for input(s): VITAMINB12, FOLATE, FERRITIN,  TIBC, IRON, RETICCTPCT in the last 72 hours. Sepsis Labs: No results for input(s): PROCALCITON, LATICACIDVEN in the last 168 hours.  Recent Results (from the past 240 hour(s))  Urine Culture     Status: Abnormal   Collection Time: 07/01/21  2:39 PM   Specimen: Urine, Clean Catch  Result Value Ref Range Status   Specimen Description   Final    URINE, CLEAN CATCH Performed at [facility], [place] [address] [place], [place] [address]    Special Requests   Final    NONE Performed at [facility], [place] [address], [place], [place] [address]    Culture MULTIPLE SPECIES PRESENT, SUGGEST RECOLLECTION (A)  Final   Report Status 07/02/2021 FINAL  Final  SARS CORONAVIRUS 2 (TAT 6-24 HRS) Nasopharyngeal Nasopharyngeal Swab     Status: None   Collection Time: 07/02/21 11:19 AM   Specimen: Nasopharyngeal Swab  Result Value Ref Range Status   SARS Coronavirus 2 NEGATIVE NEGATIVE Final    Comment: (NOTE) SARS-CoV-2 target nucleic acids are NOT DETECTED.  The SARS-CoV-2 RNA is generally detectable in upper and lower respiratory specimens during the acute phase of infection. Negative results do not preclude SARS-CoV-2 infection, do not rule out co-infections with other pathogens, and should not be used as the sole basis for treatment or other patient management decisions. Negative results must be combined with clinical observations, patient history, and epidemiological information. The expected result is Negative.  Fact Sheet for Patients: SugarRoll.be  Fact Sheet for Healthcare Providers: https://www.woods-mathews.com/  This test is not yet approved or cleared by the [place] FDA and  has been authorized for detection and/or diagnosis of SARS-CoV-2 by FDA under an Emergency Use Authorization (EUA). This EUA will remain  in effect (meaning this test can be used) for the duration of the COVID-19 declaration  under Se ction 564(b)(1) of the Act, 21 U.S.C. section 360bbb-3(b)(1), unless the authorization is terminated or revoked sooner.  Performed at [facility], [place] [address], [place], [place] [address]      Radiology Studies: DG Chest 2 View  Result Date: 07/01/2021 CLINICAL DATA:  Fall EXAM: CHEST - 2 VIEW COMPARISON:  03/18/2021 FINDINGS: Cardiomegaly, unchanged from prior exam. Left chest cardiac device with leads in the right atrium and ventricle. Redemonstrated vascular congestion, with interstitial prominence throughout the lungs, likely interstitial edema. No definite pleural effusion. No acute osseous abnormality. Status post right humeral head replacement. IMPRESSION: Cardiomegaly with pulmonary edema. Electronically Signed   By: Merilyn Baba M.D.   On: 07/01/2021 12:52   DG Ankle 2 Views Left  Result Date: 07/01/2021 CLINICAL DATA:  [place] last week twisting the ankle. Lymph edema with swelling of the entire leg. Unable to move leg. Pain. EXAM: LEFT ANKLE - 2 VIEW COMPARISON:  None. FINDINGS: Nonstandard positioning limits evaluation. Diffuse soft tissue swelling about the visualized left lower extremity. Prominent degenerative changes demonstrated in the left ankle and intertarsal joints. No acute displaced fractures are identified. No bone erosion or bone destruction. Plantar calcaneal spur. Vascular calcifications. IMPRESSION: Prominent degenerative changes in the left foot and ankle. No definite evidence of an acute fracture. Diffuse soft tissue swelling. Electronically Signed   By: Lucienne Capers M.D.   On: 07/01/2021 21:31   CT HEAD WO CONTRAST (5MM)  Result Date: 07/01/2021 CLINICAL DATA:  Altered mental status. EXAM: CT HEAD WITHOUT CONTRAST  TECHNIQUE: Contiguous axial images were obtained from the base of the skull through the vertex without intravenous contrast. COMPARISON:  CT head dated January 05, 2021. FINDINGS: Brain: No evidence of acute infarction, hemorrhage,  hydrocephalus, extra-axial collection or mass lesion/mass effect. Stable mild atrophy and chronic microvascular ischemic changes. Vascular: No hyperdense vessel or unexpected calcification. Skull: Normal. Negative for fracture or focal lesion. Sinuses/Orbits: No acute finding. Other: None. IMPRESSION: 1. No acute intracranial abnormality. Electronically Signed   By: Titus Dubin M.D.   On: 07/01/2021 13:01   DG Knee Complete 4 Views Left  Result Date: 07/01/2021 CLINICAL DATA:  Fall. EXAM: RIGHT KNEE - COMPLETE 4+ VIEW; LEFT KNEE - COMPLETE 4+ VIEW COMPARISON:  None. FINDINGS: Left knee: No acute fracture or malalignment. No joint effusion. Tricompartmental joint space narrowing, moderate to severe in the medial and patellofemoral compartments, with large bulky marginal osteophytes. Soft tissues are unremarkable. Right knee: No acute fracture or malalignment. Small joint effusion. Severe tricompartmental joint space narrowing with bone-on-bone apposition in the medial compartment. Bulky marginal osteophytes. Soft tissues are unremarkable. IMPRESSION: 1. No acute osseous abnormality. 2. Severe right and moderate to severe left knee osteoarthritis. Electronically Signed   By: Titus Dubin M.D.   On: 07/01/2021 12:48   DG Knee Complete 4 Views Right  Result Date: 07/01/2021 CLINICAL DATA:  Fall. EXAM: RIGHT KNEE - COMPLETE 4+ VIEW; LEFT KNEE - COMPLETE 4+ VIEW COMPARISON:  None. FINDINGS: Left knee: No acute fracture or malalignment. No joint effusion. Tricompartmental joint space narrowing, moderate to severe in the medial and patellofemoral compartments, with large bulky marginal osteophytes. Soft tissues are unremarkable. Right knee: No acute fracture or malalignment. Small joint effusion. Severe tricompartmental joint space narrowing with bone-on-bone apposition in the medial compartment. Bulky marginal osteophytes. Soft tissues are unremarkable. IMPRESSION: 1. No acute osseous abnormality. 2. Severe  right and moderate to severe left knee osteoarthritis. Electronically Signed   By: Titus Dubin M.D.   On: 07/01/2021 12:48     LOS: 1 day   Antonieta Pert, MD [facility]  07/03/2021, 9:53 AM

## 2021-07-03 NOTE — Progress Notes (Addendum)
[place] for  warfarin Indication:  hx atrial fibrillation  Allergies  Allergen Reactions   Chlorhexidine Gluconate Hives   Penicillins Itching    Has patient had a PCN reaction causing immediate rash, facial/tongue/throat swelling, SOB or lightheadedness with hypotension: no Has patient had a PCN reaction causing severe rash involving mucus membranes or skin necrosis: No Has patient had a PCN reaction that required hospitalization: No Has patient had a PCN reaction occurring within the last 10 years: No If all of the above answers are "NO", then may proceed with Cephalosporin use.  Tolerated Cephalosporin Date: 03/17/21.     Patient Measurements: Height: 5\' 1"  (154.9 cm) Weight: 124.6 kg (274 lb 11.1 oz) IBW/kg (Calculated) : 47.8 Heparin Dosing Weight:   Vital Signs: BP: 131/76 (08/27 0450) Pulse Rate: 64 (08/27 0450)  Labs: Recent Labs    07/01/21 1142 07/02/21 0410 07/03/21 0541  HGB 10.1*  --  10.4*  HCT 32.2*  --  34.2*  PLT 107*  --  113*  LABPROT 41.2* 79.2*  --   INR 4.3* 9.9*  --   CREATININE 1.99*  --  1.79*     Estimated Creatinine Clearance: 33.7 mL/min (A) (by C-G formula based on SCr of 1.79 mg/dL (H)).   Medications:  - on warfarin 2.5mg  daily except 5mg  on Tue PTA (last dose taken on 8/24)  Assessment: Patient is a 75 y.o F with hx afib on warfarin PTA, presented to the ED on 8/25 with failure to thrive, She also reported of a fall one week ago and reports poor oral intake afterwards. Head CT was negative for any acute abnormality.  Pharmacy has been consulted to dose warfarin while she's hospitalized.  Today, 07/03/2021: - INR remains elevated at 4.7 after vitamin K 2.5mg  PO x1.  Of note, if patient has poor oral intake or has poor nutritional status, this can make her more sensitive to warfarin - hgb 10.4 and plts 113K on 8/27 (both have remained stable) - no bleeding documented - drug-drug intxns: Being  on abx (currently on ceftriaxone) can make her more sensitive to warfarin   Goal of Therapy:  INR 2-3 Monitor platelets by anticoagulation protocol: Yes   Plan:  - hold warfarin for now d/t supratherapeutic INR - daily INR - monitor for s/sx bleeding  Dimple Nanas, PharmD 07/03/2021 9:13 AM

## 2021-07-04 DIAGNOSIS — L03116 Cellulitis of left lower limb: Secondary | ICD-10-CM | POA: Diagnosis not present

## 2021-07-04 LAB — COMPREHENSIVE METABOLIC PANEL
ALT: 17 U/L (ref 0–44)
AST: 27 U/L (ref 15–41)
Albumin: 2.4 g/dL — ABNORMAL LOW (ref 3.5–5.0)
Alkaline Phosphatase: 230 U/L — ABNORMAL HIGH (ref 38–126)
Anion gap: 9 (ref 5–15)
BUN: 54 mg/dL — ABNORMAL HIGH (ref 8–23)
CO2: 32 mmol/L (ref 22–32)
Calcium: 9.8 mg/dL (ref 8.9–10.3)
Chloride: 98 mmol/L (ref 98–111)
Creatinine, Ser: 1.71 mg/dL — ABNORMAL HIGH (ref 0.44–1.00)
GFR, Estimated: 31 mL/min — ABNORMAL LOW (ref >60)
Glucose, Bld: 101 mg/dL — ABNORMAL HIGH (ref 70–99)
Potassium: 3.3 mmol/L — ABNORMAL LOW (ref 3.5–5.1)
Sodium: 139 mmol/L (ref 135–145)
Total Bilirubin: 3.2 mg/dL — ABNORMAL HIGH (ref 0.3–1.2)
Total Protein: 6.7 g/dL (ref 6.5–8.1)

## 2021-07-04 LAB — CBC
HCT: 32.1 % — ABNORMAL LOW (ref 36.0–46.0)
Hemoglobin: 10.3 g/dL — ABNORMAL LOW (ref 12.0–15.0)
MCH: 29.8 pg (ref 26.0–34.0)
MCHC: 32.1 g/dL (ref 30.0–36.0)
MCV: 92.8 fL (ref 80.0–100.0)
Platelets: 123 10*3/uL — ABNORMAL LOW (ref 150–400)
RBC: 3.46 MIL/uL — ABNORMAL LOW (ref 3.87–5.11)
RDW: 16.8 % — ABNORMAL HIGH (ref 11.5–15.5)
WBC: 8.2 10*3/uL (ref 4.0–10.5)
nRBC: 0.2 % (ref 0.0–0.2)

## 2021-07-04 LAB — PROTIME-INR
INR: 6.7 (ref 0.8–1.2)
Prothrombin Time: 58.1 seconds — ABNORMAL HIGH (ref 11.4–15.2)

## 2021-07-04 LAB — PHOSPHORUS: Phosphorus: 3.1 mg/dL (ref 2.5–4.6)

## 2021-07-04 MED ORDER — PHYTONADIONE 5 MG PO TABS
2.5000 mg | ORAL_TABLET | Freq: Once | ORAL | Status: DC
Start: 2021-07-04 — End: 2021-07-04
  Filled 2021-07-04: qty 1

## 2021-07-04 MED ORDER — SODIUM CHLORIDE 0.9 % IV SOLN
INTRAVENOUS | Status: DC | PRN
  Administered 2021-07-04 – 2021-07-16 (×2): 250 mL via INTRAVENOUS

## 2021-07-04 MED ORDER — POTASSIUM CHLORIDE CRYS ER 20 MEQ PO TBCR
40.0000 meq | EXTENDED_RELEASE_TABLET | Freq: Once | ORAL | Status: AC
  Administered 2021-07-04: 40 meq via ORAL
  Filled 2021-07-04: qty 2

## 2021-07-04 NOTE — Plan of Care (Signed)
  Problem: Education: Goal: Knowledge of General Education information will improve Description: Including pain rating scale, medication(s)/side effects and non-pharmacologic comfort measures Outcome: Progressing   Problem: Nutrition: Goal: Adequate nutrition will be maintained Outcome: Progressing   Problem: Pain Managment: Goal: General experience of comfort will improve Outcome: Progressing   Problem: Activity: Goal: Risk for activity intolerance will decrease Outcome: Not Progressing

## 2021-07-04 NOTE — Progress Notes (Signed)
PROGRESS NOTE    Katelyn Jackson  YFV:[id number] DOB: 01/15/1946 DOA: 07/01/2021 PCP: Cassandria Anger, MD   Chief Complaint  Patient presents with   Dysuria   Brief Narrative: 75 year old female with multiple history including permanent A. fib on Coumadin, HTN, HLD, pacemaker in place, history of breast cancer, hypothyroidism, chronic bilateral lymphedema, recurrent fall, morbid obesity BMI 51, GERD, pulmonary hypertension , who needs full assistance for her daily life activities, has aid 6x/wk at home, brought to the ED by son for evaluation of recent fall on last Monday.Patient fell from bed on Monday since then complaining of left shoulder pain, left ankle pain and bilateral knee pain and unable to walk or stand go to bathroom and progressively declining.  Per admitting: Temperature 97.9, blood pressure 137/66, pulse: 99, respiratory rate 16, placed on 2 L of oxygen via nasal cannula, WBC of 13.2, H&H 10.1/32.2, platelet 107, creatinine: 1.99, BUN: 56, GFR: 26, INR 4.3 which trended up to 9.9 this morning, chest x-ray shows pulmonary edema.  On exam she was found to have left lower extremity redness.  Patient was started on Rocephin, vitamin K 2.5 mg p.o. once Lasix 40 mg IV once given in the ED along with morphine and Zofran.  [facility] hospitalist consulted for admission for new onset oxygen requirement due to pulmonary edema, left lower extremity cellulitis and supratherapeutic INR. She was managed with IV antibiotics for cellulitis, IV Lasix monitoring of INR.  ABGs also showed CO2 retention-chronic hypercapnia compensated  Subjective: Seen and examined this morning. Overnight K3.3, INR up at 6.7. Complains of pain in lower extremities tender on palpation  Assessment & Plan:  Cellulitis of left lower extremity Chronic bilateral lymphedema Leukocytosis nicely improved.  On empiric ceftriaxone for cellulitis.suspect secondary bacterial infection due to swelling.  Leg erythema is  better but is still tender.  Cont pain control  Morbid Obesity w./ BMI 51 Recurrent falls Ambulatory dysfunction Severe right and moderate to severe left knee ostearthritis and having pain Left ankle pain: Had xray on knees and let ankle in ED no fracture.  Pain has been chronic since.  Continue pain control, tramadol at home, here on opiates, cont tylenol scheduled prn muscle relanat. PTOT as tolerated.  Mild confusion per son- CT head no acute findings. Checked abg-compensated mild hypercapnia PCO2 49.[name]  Continue supportive care suspect it was from opioid.  PTOT supportive care.  Acute hypoxic respiratory failure Fluid overload/pulmonary edema Acute on chronic systolic CHF-EF 16-38%, bnp 620 Chronic hypercapnia compensated, likely from OSA/OHS Continue IV Lasix, monitor weight which is increasing unclear how accurate,  voiding well had 2626ml UOP. Cont strict intake output, daily weight supplemental oxygen to maintain saturation 92% and wean as tolerated.  Last echo reviewed from this year. Filed Weights   07/01/21 1128 07/03/21 0500 07/04/21 0435  Weight: 113.4 kg 124.6 kg 126.2 kg  Net IO Since Admission: -2,790 mL [07/04/21 1114]   Intake/Output Summary (Last 24 hours) at 07/04/2021 1114 Last data filed at 07/04/2021 0954 Gross per 24 hour  Intake 1180 ml  Output 3900 ml  Net -2720 ml    AKI on CKD stage IIIb, baseline creatinine 1.4-1.6.  Creatinine holding stable 1.7 and may be close to baseline.  Monitor while on lasix, cont to monitor. Recent Labs    03/01/21 0850 03/17/21 1703 03/18/21 0324 03/19/21 0335 03/20/21 0825 06/10/21 0906 07/01/21 1142 07/03/21 0541 07/04/21 0527  BUN 16 17 18 22 22  30* 56* 58* 54*  CREATININE 1.46* 1.67* 1.74*  1.84* 1.59* 1.85* 1.99* 1.79* 1.71*  Essential hypertension: BP controlled on home coreg Hypophosphatemia-continue k phos . Resolved Hypokalemia will replete  Chronic atrial fibrillation chronic Coumadin Supratherapeutic  INR Pacemaker in place S/p vitamin k x1, INR slightly up we will hold off on reversing as there is no evidence of bleeding. We will let it drift down slowly. Recent Labs  Lab 07/01/21 1142 07/02/21 0410 07/03/21 0541 07/04/21 0527  INR 4.3* 9.9* 4.7* 6.7*    Anemia of chronic disease: Baseline H&H 10.1, 8 stable. Recent Labs  Lab 07/01/21 1142 07/03/21 0541 07/04/21 0527  HGB 10.1* 10.4* 10.3*  HCT 32.2* 34.2* 32.1*    Hypothyroidism: Continue Synthroid.  Euthyroid  TSH 2.7 Thrombocytopenia-stable on monitor Recent Labs  Lab 07/01/21 1142 07/03/21 0541 07/04/21 0527  PLT 107* 113* 123*  HLD  cont statin GERD cont PPI History of breast cancer  Pressure injury of skin POA SEE BELOW Diet Order             Diet 2 gram sodium Room service appropriate? Yes; Fluid consistency: Thin  Diet effective now                  Patient's Body mass index is 52.57 kg/m.  Pressure Injury 07/03/21 Thigh Posterior;Right Stage 3 -  Full thickness tissue loss. Subcutaneous fat may be visible but bone, tendon or muscle are NOT exposed. (Active)  07/03/21 0453  Location: Thigh  Location Orientation: Posterior;Right  Staging: Stage 3 -  Full thickness tissue loss. Subcutaneous fat may be visible but bone, tendon or muscle are NOT exposed.  Wound Description (Comments):   Present on Admission: Yes     Pressure Injury 07/03/21 Sacrum Stage 2 -  Partial thickness loss of dermis presenting as a shallow open injury with a red, pink wound bed without slough. (Active)  07/03/21 2105  Location: Sacrum  Location Orientation:   Staging: Stage 2 -  Partial thickness loss of dermis presenting as a shallow open injury with a red, pink wound bed without slough.  Wound Description (Comments):   Present on Admission: Yes    DVT prophylaxis: SCDs Start: 07/02/21 0948, COUMADIN Code Status:   Code Status: Full Code  Family Communication: plan of care discussed with patient at bedside.  I  discussed with patient's son at on the phone 8/27  Status is: Inpatient Remains inpatient appropriate because:Unsafe d/c plan, IV treatments appropriate due to intensity of illness or inability to take PO, and Inpatient level of care appropriate due to severity of illness Dispo: The patient is from: Home              Anticipated d/c is to:  TBD              Patient currently is not medically stable to d/c.   Difficult to place patient No Unresulted Labs (From admission, onward)     Start     Ordered   07/04/21 0500  Protime-INR  Daily,   R      07/02/21 1153   07/04/21 0500  Comprehensive metabolic panel  Daily,   R     Question:  Specimen collection method  Answer:  Lab=Lab collect   07/03/21 0948   07/04/21 0500  CBC  Daily,   R     Question:  Specimen collection method  Answer:  Lab=Lab collect   07/03/21 0948           Medications reviewed:  Scheduled Meds:  carvedilol  25 mg Oral BID   cholecalciferol  2,000 Units Oral Daily   exemestane  25 mg Oral QPC breakfast   furosemide  40 mg Intravenous BID   levothyroxine  50 mcg Oral QAC breakfast   magnesium oxide  400 mg Oral BID   pantoprazole  40 mg Oral Daily   phosphorus  250 mg Oral BID   potassium chloride  10 mEq Oral Daily   pravastatin  20 mg Oral q1800   vitamin B-12  2,000 mcg Oral Daily   Continuous Infusions:  sodium chloride 250 mL (07/04/21 0822)   cefTRIAXone (ROCEPHIN)  IV 2 g (07/04/21 0827)   Consultants:see note  Procedures:see note Antimicrobials: Anti-infectives (From admission, onward)    Start     Dose/Rate Route Frequency Ordered Stop   07/03/21 0900  cefTRIAXone (ROCEPHIN) 2 g in sodium chloride 0.9 % 100 mL IVPB        2 g 200 mL/hr over 30 Minutes Intravenous Every 24 hours 07/02/21 0950 07/09/21 0859   07/02/21 0915  cefTRIAXone (ROCEPHIN) 2 g in sodium chloride 0.9 % 100 mL IVPB        2 g 200 mL/hr over 30 Minutes Intravenous  Once 07/02/21 4098 07/02/21 1033       Culture/Microbiology    Component Value Date/Time   SDES  07/01/2021 1439    URINE, CLEAN CATCH Performed at [facility], [place] [address], [place], [place] [address]    SPECREQUEST  07/01/2021 1439    NONE Performed at [facility], [place] [address], [place], [place] [address]    CULT MULTIPLE SPECIES PRESENT, SUGGEST RECOLLECTION (A) 07/01/2021 1439   REPTSTATUS 07/02/2021 FINAL 07/01/2021 1439    Other culture-see note  Objective: Vitals: Today's Vitals   07/04/21 0215 07/04/21 0435 07/04/21 0446 07/04/21 0610  BP: (!) 142/65  (!) 136/93   Pulse: 60  67   Resp: 18  18   Temp: 98.4 F (36.9 C)  98.3 F (36.8 C)   TempSrc: Oral  Oral   SpO2: 99%  99%   Weight:  126.2 kg    Height:      PainSc:    Asleep    Intake/Output Summary (Last 24 hours) at 07/04/2021 1114 Last data filed at 07/04/2021 0954 Gross per 24 hour  Intake 1180 ml  Output 3900 ml  Net -2720 ml   Filed Weights   07/01/21 1128 07/03/21 0500 07/04/21 0435  Weight: 113.4 kg 124.6 kg 126.2 kg   Weight change: 1.6 kg  Intake/Output from previous day: 08/27 0701 - 08/28 0700 In: 6213 [P.O.:1320; IV Piggyback:100] Out: 3200 [Urine:3200] Intake/Output this shift: Total I/O In: 240 [P.O.:240] Out: 1250 [Urine:1250] Filed Weights   07/01/21 1128 07/03/21 0500 07/04/21 0435  Weight: 113.4 kg 124.6 kg 126.2 kg   Examination: General exam: AAO, morbidly obese, not in distress.  HEENT:Oral mucosa moist, Ear/Nose WNL grossly, dentition normal. Respiratory system: bilaterally diminished with basal crackles no use of accessory muscle Cardiovascular system: S1 & S2 +, No JVD,. Gastrointestinal system: Abdomen soft,NT,ND, BS+ Nervous System:Alert, awake, moving extremities and grossly nonfocal Extremities: Bilateral significant lymphedema/edema chronic appearing with bilateral diffuse tenderness in legs Skin: No rashes,no icterus. MSK: Normal muscle bulk,tone, power    Data Reviewed: I have personally reviewed following labs and imaging studies CBC: Recent Labs  Lab 07/01/21 1142 07/03/21 0541 07/04/21 0527  WBC 13.2* 10.7* 8.2  NEUTROABS 11.7*  --   --   HGB 10.1* 10.4*  10.3*  HCT 32.2* 34.2* 32.1*  MCV 94.7 97.4 92.8  PLT 107* 113* 284*   Basic Metabolic Panel: Recent Labs  Lab 07/01/21 1142 07/02/21 0949 07/03/21 0541 07/04/21 0527  NA 137  --  143 139  K 4.0  --  3.7 3.3*  CL 100  --  102 98  CO2 27  --  31 32  GLUCOSE 91  --  104* 101*  BUN 56*  --  58* 54*  CREATININE 1.99*  --  1.79* 1.71*  CALCIUM 10.0  --  10.3 9.8  MG  --  1.7  --   --   PHOS  --  2.1*  --  3.1   GFR: Estimated Creatinine Clearance: 35.5 mL/min (A) (by C-G formula based on SCr of 1.71 mg/dL (H)). Liver Function Tests: Recent Labs  Lab 07/03/21 0541 07/04/21 0527  AST 13* 27  ALT 14 17  ALKPHOS 140* 230*  BILITOT 3.6* 3.2*  PROT 7.1 6.7  ALBUMIN 2.7* 2.4*   No results for input(s): LIPASE, AMYLASE in the last 168 hours. No results for input(s): AMMONIA in the last 168 hours. Coagulation Profile: Recent Labs  Lab 07/01/21 1142 07/02/21 0410 07/03/21 0541 07/04/21 0527  INR 4.3* 9.9* 4.7* 6.7*   Cardiac Enzymes: No results for input(s): CKTOTAL, CKMB, CKMBINDEX, TROPONINI in the last 168 hours. BNP (last 3 results) No results for input(s): PROBNP in the last 8760 hours. HbA1C: No results for input(s): HGBA1C in the last 72 hours. CBG: No results for input(s): GLUCAP in the last 168 hours. Lipid Profile: No results for input(s): CHOL, HDL, LDLCALC, TRIG, CHOLHDL, LDLDIRECT in the last 72 hours. Thyroid Function Tests: Recent Labs    07/02/21 1235  TSH 2.747   Anemia Panel: No results for input(s): VITAMINB12, FOLATE, FERRITIN, TIBC, IRON, RETICCTPCT in the last 72 hours. Sepsis Labs: No results for input(s): PROCALCITON, LATICACIDVEN in the last 168 hours.  Recent Results (from the past 240 hour(s))  Urine Culture     Status:  Abnormal   Collection Time: 07/01/21  2:39 PM   Specimen: Urine, Clean Catch  Result Value Ref Range Status   Specimen Description   Final    URINE, CLEAN CATCH Performed at [facility], [place] [address], [place], [place] [address]    Special Requests   Final    NONE Performed at [facility], [place] [address] [place], [place] [address]    Culture MULTIPLE SPECIES PRESENT, SUGGEST RECOLLECTION (A)  Final   Report Status 07/02/2021 FINAL  Final  SARS CORONAVIRUS 2 (TAT 6-24 HRS) Nasopharyngeal Nasopharyngeal Swab     Status: None   Collection Time: 07/02/21 11:19 AM   Specimen: Nasopharyngeal Swab  Result Value Ref Range Status   SARS Coronavirus 2 NEGATIVE NEGATIVE Final    Comment: (NOTE) SARS-CoV-2 target nucleic acids are NOT DETECTED.  The SARS-CoV-2 RNA is generally detectable in upper and lower respiratory specimens during the acute phase of infection. Negative results do not preclude SARS-CoV-2 infection, do not rule out co-infections with other pathogens, and should not be used as the sole basis for treatment or other patient management decisions. Negative results must be combined with clinical observations, patient history, and epidemiological information. The expected result is Negative.  Fact Sheet for Patients: SugarRoll.be  Fact Sheet for Healthcare Providers: https://www.woods-mathews.com/  This test is not yet approved or cleared by the [place] FDA and  has been authorized for detection and/or diagnosis of SARS-CoV-2  by FDA under an Emergency Use Authorization (EUA). This EUA will remain  in effect (meaning this test can be used) for the duration of the COVID-19 declaration under Se ction 564(b)(1) of the Act, 21 U.S.C. section 360bbb-3(b)(1), unless the authorization is terminated or revoked sooner.  Performed at [facility], [place] [address], [place],  [place] [address]      Radiology Studies: No results found.   LOS: 2 days   Antonieta Pert, MD [facility]  07/04/2021, 11:14 AM

## 2021-07-04 NOTE — Progress Notes (Signed)
Date and time results received: 07/04/21 (use smartphrase ".now" to insert current time)  Test: INR  Critical Value: 6.7  Name of Provider Notified: [facility] p[facility]  Orders Received? Or Actions Taken?: RN awaiting orders; RN will continue to monitor patient

## 2021-07-04 NOTE — Progress Notes (Signed)
[place] for  warfarin Indication:  hx atrial fibrillation  Allergies  Allergen Reactions   Chlorhexidine Gluconate Hives   Penicillins Itching    Has patient had a PCN reaction causing immediate rash, facial/tongue/throat swelling, SOB or lightheadedness with hypotension: no Has patient had a PCN reaction causing severe rash involving mucus membranes or skin necrosis: No Has patient had a PCN reaction that required hospitalization: No Has patient had a PCN reaction occurring within the last 10 years: No If all of the above answers are "NO", then may proceed with Cephalosporin use.  Tolerated Cephalosporin Date: 03/17/21.     Patient Measurements: Height: 5\' 1"  (154.9 cm) Weight: 126.2 kg (278 lb 3.5 oz) IBW/kg (Calculated) : 47.8 Heparin Dosing Weight:   Vital Signs: Temp: 98.3 F (36.8 C) (08/28 0446) Temp Source: Oral (08/28 0446) BP: 136/93 (08/28 0446) Pulse Rate: 67 (08/28 0446)  Labs: Recent Labs    07/01/21 1142 07/02/21 0410 07/03/21 0541 07/04/21 0527  HGB 10.1*  --  10.4* 10.3*  HCT 32.2*  --  34.2* 32.1*  PLT 107*  --  113* 123*  LABPROT 41.2* 79.2* 44.1* 58.1*  INR 4.3* 9.9* 4.7* 6.7*  CREATININE 1.99*  --  1.79* 1.71*     Estimated Creatinine Clearance: 35.5 mL/min (A) (by C-G formula based on SCr of 1.71 mg/dL (H)).   Medications:  - on warfarin 2.5mg  daily except 5mg  on Tue PTA (last dose taken on 8/24)  Assessment: Patient is a 75 y.o F with hx afib on warfarin PTA, presented to the ED on 8/25 with failure to thrive, She also reported of a fall one week ago and reports poor oral intake afterwards. Head CT was negative for any acute abnormality.  Pharmacy has been consulted to dose warfarin while she's hospitalized.  Today, 07/04/2021: - INR remains elevated at 6.7.  No plans to give vitamin K today per MD as patient is not having signs of bleeding - would like to left INR drift down on its own.  -Of  note, if patient has poor oral intake or has poor nutritional status, this can make her more sensitive to warfarin - hgb 10.3 and plts 123K on 8/27 (both have remained stable) - no bleeding reported by MD - drug-drug intxns: Being on abx (currently on ceftriaxone) can make her more sensitive to warfarin  Goal of Therapy:  INR 2-3 Monitor platelets by anticoagulation protocol: Yes   Plan:  - HOLD warfarin for now d/t supratherapeutic INR - Daily INR - Monitor for s/sx bleeding  Pharmacy will sign off consult since anticoagulation being held and will monitor peripherally.  Please re-consult pharmacy when determined safe to resume anticoagulation.  Dimple Nanas, PharmD 07/04/2021 8:59 AM

## 2021-07-05 DIAGNOSIS — L03116 Cellulitis of left lower limb: Secondary | ICD-10-CM | POA: Diagnosis not present

## 2021-07-05 LAB — PROTIME-INR
INR: 9.1 (ref 0.8–1.2)
Prothrombin Time: 74.3 seconds — ABNORMAL HIGH (ref 11.4–15.2)

## 2021-07-05 LAB — COMPREHENSIVE METABOLIC PANEL
ALT: 20 U/L (ref 0–44)
AST: 34 U/L (ref 15–41)
Albumin: 2.4 g/dL — ABNORMAL LOW (ref 3.5–5.0)
Alkaline Phosphatase: 235 U/L — ABNORMAL HIGH (ref 38–126)
Anion gap: 10 (ref 5–15)
BUN: 43 mg/dL — ABNORMAL HIGH (ref 8–23)
CO2: 33 mmol/L — ABNORMAL HIGH (ref 22–32)
Calcium: 9.6 mg/dL (ref 8.9–10.3)
Chloride: 98 mmol/L (ref 98–111)
Creatinine, Ser: 1.28 mg/dL — ABNORMAL HIGH (ref 0.44–1.00)
GFR, Estimated: 44 mL/min — ABNORMAL LOW (ref >60)
Glucose, Bld: 108 mg/dL — ABNORMAL HIGH (ref 70–99)
Potassium: 3 mmol/L — ABNORMAL LOW (ref 3.5–5.1)
Sodium: 141 mmol/L (ref 135–145)
Total Bilirubin: 3.1 mg/dL — ABNORMAL HIGH (ref 0.3–1.2)
Total Protein: 6.3 g/dL — ABNORMAL LOW (ref 6.5–8.1)

## 2021-07-05 LAB — CBC
HCT: 31.2 % — ABNORMAL LOW (ref 36.0–46.0)
Hemoglobin: 9.9 g/dL — ABNORMAL LOW (ref 12.0–15.0)
MCH: 29.8 pg (ref 26.0–34.0)
MCHC: 31.7 g/dL (ref 30.0–36.0)
MCV: 94 fL (ref 80.0–100.0)
Platelets: 138 10*3/uL — ABNORMAL LOW (ref 150–400)
RBC: 3.32 MIL/uL — ABNORMAL LOW (ref 3.87–5.11)
RDW: 16.8 % — ABNORMAL HIGH (ref 11.5–15.5)
WBC: 8.2 10*3/uL (ref 4.0–10.5)
nRBC: 0 % (ref 0.0–0.2)

## 2021-07-05 MED ORDER — CEFADROXIL 500 MG PO CAPS
500.0000 mg | ORAL_CAPSULE | Freq: Two times a day (BID) | ORAL | Status: AC
  Administered 2021-07-05 – 2021-07-07 (×6): 500 mg via ORAL
  Filled 2021-07-05 (×6): qty 1

## 2021-07-05 MED ORDER — PHYTONADIONE 5 MG PO TABS
2.5000 mg | ORAL_TABLET | Freq: Once | ORAL | Status: AC
  Administered 2021-07-05: 2.5 mg via ORAL
  Filled 2021-07-05: qty 1

## 2021-07-05 NOTE — Consult Note (Signed)
Reason for Consult:low back, bilateral hip and leg pain   Unable to transfer from bed to chair since fall last Tuesday Referring Physician: hospitalist  Katelyn Jackson is an 75 y.o. female.  HPI: 75 yo [name] female was interviewed and examined with the assistance of an interpreter on the video monitor.  She states her primary mode of mobility has been a wheel chair for the last 8 months since she had a pacemaker implanted.  She usually was able to self transfer and use a walker for short distances.  Last week she fell getting out of bed injuring her posterior hip and shoulder.  Xrays at [facility] of hip and shoulder were negative.  She was brought into the ER at [place] with inability to ambulate 3 days later.  She states that she was given a shot on Tuesday at one of the hospitals that made her pain much worse and has now made it so she is unable to independently transfer.    Today she has severe back pain that radiates down both legs.  She has severe lymphedema of both legs.  Both feet are warm with good capillary refill but unable to palpate pulses due to swelling and diffuse tenderness.  She is unable to move her legs due to excruciating pain.    Review of her xrays of shoulder and hips show no fracture.  Incidentally seen is the severe L5-S1 DDD.  Both knees show severe arthritis with possible femoral collapse on the left and a tibial plateau fracture vs chronic deformity due to obesity and osteoarthritis.   We are ordering CT of her lumbar spine looking for progressive spinal disease vs and out of control hematoma as her INR has been as low at 4 and as high as 9.  The Pelvis and lower extremity CT scan is looking for an occult fracture of her hips or knees.    Past Medical History:  Diagnosis Date   Arthritis    Cancer [facility])    breast cancer   Chronic atrial fibrillation (HCC)    Chronic kidney disease    sees Dr Florene Glen   Cramps, muscle, general    Dyspnea on exertion     Dysrhythmia    a-fib,    GERD (gastroesophageal reflux disease)    Headache    Hyperlipidemia    Hypertension    Hypothyroidism    Lymphedema    Moderate to severe pulmonary hypertension ([place])    Obesity    Personal history of radiation therapy    Pneumonia 12/2020   Presence of permanent cardiac pacemaker 01/21/2021   for bradycardia    Syncope 12/2020   needed a pacemaker    Past Surgical History:  Procedure Laterality Date   APPENDECTOMY     BREAST BIOPSY Left 2018   BREAST LUMPECTOMY Left 04/04/2017   x3   BREAST LUMPECTOMY WITH NEEDLE LOCALIZATION AND AXILLARY SENTINEL LYMPH NODE BX Left 04/04/2017   Procedure: BREAST LUMPECTOMY WITH NEEDLE LOCALIZATION x3 AND AXILLARY SENTINEL LYMPH NODE BX;  Surgeon: [name], MD;  Location: [place];  Service: General;  Laterality: Left;   CATARACT EXTRACTION W/ INTRAOCULAR LENS  IMPLANT, BILATERAL Bilateral 2018   COLONOSCOPY     EYE SURGERY     GLAUCOMA SURGERY Bilateral 2018   INSERT / REPLACE / REMOVE PACEMAKER  01/21/2021   RE-EXCISION OF BREAST CANCER,SUPERIOR MARGINS Left 04/26/2017   Procedure: RE-EXCISION OF LEFT BREAST CANCER;  Surgeon: [name], MD;  Location: [place];  Service: General;  Laterality: Left;   SHOULDER HEMI-ARTHROPLASTY Right 03/17/2021   Procedure: SHOULDER HEMI-ARTHROPLASTY;  Surgeon: Hiram Gash, MD;  Location: [facility];  Service: Orthopedics;  Laterality: Right;    Family History  Problem Relation Age of Onset   CAD Mother 42   Stroke Mother 35       hemorr CVA   COPD Father 67   Cancer Neg Hx     Social History:  reports that she has never smoked. She has never used smokeless tobacco. She reports that she does not drink alcohol and does not use drugs.  Allergies:  Allergies  Allergen Reactions   Chlorhexidine Gluconate Hives   Penicillins Itching    Has patient had a PCN reaction causing immediate rash, facial/tongue/throat swelling, SOB or lightheadedness with hypotension: no Has patient  had a PCN reaction causing severe rash involving mucus membranes or skin necrosis: No Has patient had a PCN reaction that required hospitalization: No Has patient had a PCN reaction occurring within the last 10 years: No If all of the above answers are "NO", then may proceed with Cephalosporin use.  Tolerated Cephalosporin Date: 03/17/21.     Medications: I have reviewed the patient's current medications.  Results for orders placed or performed during the hospital encounter of 07/01/21 (from the past 48 hour(s))  Protime-INR     Status: Abnormal   Collection Time: 07/04/21  5:27 AM  Result Value Ref Range   Prothrombin Time 58.1 (H) 11.4 - 15.2 seconds   INR 6.7 (HH) 0.8 - 1.2    Comment: CRITICAL RESULT CALLED TO, READ BACK BY AND VERIFIED WITH: DAVIS,R 07/04/21 @0625  BY SEEL,M (NOTE) INR goal varies based on device and disease states. Performed at [facility], [place] [address], [place], [place] [address]   Comprehensive metabolic panel     Status: Abnormal   Collection Time: 07/04/21  5:27 AM  Result Value Ref Range   Sodium 139 135 - 145 mmol/L   Potassium 3.3 (L) 3.5 - 5.1 mmol/L   Chloride 98 98 - 111 mmol/L   CO2 32 22 - 32 mmol/L   Glucose, Bld 101 (H) 70 - 99 mg/dL    Comment: Glucose reference range applies only to samples taken after fasting for at least 8 hours.   BUN 54 (H) 8 - 23 mg/dL   Creatinine, Ser 1.71 (H) 0.44 - 1.00 mg/dL   Calcium 9.8 8.9 - 10.3 mg/dL   Total Protein 6.7 6.5 - 8.1 g/dL   Albumin 2.4 (L) 3.5 - 5.0 g/dL   AST 27 15 - 41 U/L   ALT 17 0 - 44 U/L   Alkaline Phosphatase 230 (H) 38 - 126 U/L   Total Bilirubin 3.2 (H) 0.3 - 1.2 mg/dL   GFR, Estimated 31 (L) >60 mL/min    Comment: (NOTE) Calculated using the CKD-EPI Creatinine Equation (2021)    Anion gap 9 5 - 15    Comment: Performed at [facility], [place] [address] [place],  [address]  CBC     Status: Abnormal   Collection Time: 07/04/21   5:27 AM  Result Value Ref Range   WBC 8.2 4.0 - 10.5 K/uL   RBC 3.46 (L) 3.87 - 5.11 MIL/uL   Hemoglobin 10.3 (L) 12.0 - 15.0 g/dL   HCT 32.1 (L) 36.0 - 46.0 %   MCV 92.8 80.0 - 100.0 fL   MCH 29.8 26.0 - 34.0 pg   MCHC 32.1 30.0 -  36.0 g/dL   RDW 16.8 (H) 11.5 - 15.5 %   Platelets 123 (L) 150 - 400 K/uL   nRBC 0.2 0.0 - 0.2 %    Comment: Performed at [facility], [place] [address], [place], [place] [address]  Phosphorus     Status: None   Collection Time: 07/04/21  5:27 AM  Result Value Ref Range   Phosphorus 3.1 2.5 - 4.6 mg/dL    Comment: Performed at [facility], [place] [address], [place], [place] [address]  Protime-INR     Status: Abnormal   Collection Time: 07/05/21  4:30 AM  Result Value Ref Range   Prothrombin Time 74.3 (H) 11.4 - 15.2 seconds   INR 9.1 (HH) 0.8 - 1.2    Comment: CRITICAL RESULT CALLED TO, READ BACK BY AND VERIFIED WITH: OKAFOR,RN (NOTE) INR goal varies based on device and disease states. Performed at [facility], [place] [address] [place], [place] [address]   Comprehensive metabolic panel     Status: Abnormal   Collection Time: 07/05/21  4:30 AM  Result Value Ref Range   Sodium 141 135 - 145 mmol/L   Potassium 3.0 (L) 3.5 - 5.1 mmol/L   Chloride 98 98 - 111 mmol/L   CO2 33 (H) 22 - 32 mmol/L   Glucose, Bld 108 (H) 70 - 99 mg/dL    Comment: Glucose reference range applies only to samples taken after fasting for at least 8 hours.   BUN 43 (H) 8 - 23 mg/dL   Creatinine, Ser 1.28 (H) 0.44 - 1.00 mg/dL   Calcium 9.6 8.9 - 10.3 mg/dL   Total Protein 6.3 (L) 6.5 - 8.1 g/dL   Albumin 2.4 (L) 3.5 - 5.0 g/dL   AST 34 15 - 41 U/L   ALT 20 0 - 44 U/L   Alkaline Phosphatase 235 (H) 38 - 126 U/L   Total Bilirubin 3.1 (H) 0.3 - 1.2 mg/dL   GFR, Estimated 44 (L) >60 mL/min    Comment: (NOTE) Calculated using the CKD-EPI Creatinine Equation (2021)    Anion gap 10 5 - 15    Comment: Performed at [facility], [place] [address], [place], [place] [address]  CBC     Status: Abnormal   Collection Time: 07/05/21  4:30 AM  Result Value Ref Range   WBC 8.2 4.0 - 10.5 K/uL   RBC 3.32 (L) 3.87 - 5.11 MIL/uL   Hemoglobin 9.9 (L) 12.0 - 15.0 g/dL   HCT 31.2 (L) 36.0 - 46.0 %   MCV 94.0 80.0 - 100.0 fL   MCH 29.8 26.0 - 34.0 pg   MCHC 31.7 30.0 - 36.0 g/dL   RDW 16.8 (H) 11.5 - 15.5 %   Platelets 138 (L) 150 - 400 K/uL   nRBC 0.0 0.0 - 0.2 %    Comment: Performed at [facility], [place] [address] [place], [place] [address]    No results found.  Review of Systems  Constitutional:  Positive for activity change.  HENT: Negative.    Eyes: Negative.   Respiratory: Negative.    Cardiovascular:  Positive for leg swelling.  Gastrointestinal: Negative.   Endocrine: Negative.   Genitourinary: Negative.   Musculoskeletal:  Positive for arthralgias, back pain, gait problem, joint swelling and myalgias.  Skin:        Left leg has a small venous stasis ulcer.  Both legs show evidence of chronic lymphedema.  Neurological:  Positive  for weakness.  Hematological:  Bruises/bleeds easily.  Blood pressure (!) 155/75, pulse 78, temperature 99.8 F (37.7 C), temperature source Oral, resp. rate 18, height 5\' 1"  (1.549 m), weight 121.8 kg, SpO2 95 %. Physical Exam Constitutional:      Appearance: She is obese.  HENT:     Head: Normocephalic and atraumatic.     Right Ear: External ear normal.     Left Ear: External ear normal.     Nose: Nose normal.     Mouth/Throat:     Mouth: Mucous membranes are moist.  Eyes:     Conjunctiva/sclera: Conjunctivae normal.  Cardiovascular:     Rate and Rhythm: Normal rate.  Pulmonary:     Effort: Pulmonary effort is normal.  Abdominal:     Palpations: Abdomen is soft.  Musculoskeletal:     Cervical back: Tenderness present.     Comments: Patient can move her right hand and elbow.  Right shoulder (hemi arthroplasty) has limited  ROM.  Left upper extremity has limited function due to weakness.  Patient requested help from the CNA to feed her.  Unable to move both lower extremities due to excruciating pain and weakness.  She cries out in pain when I try to touch her feet to feel her pulses.    Skin:    General: Skin is warm.     Capillary Refill: Capillary refill takes less than 2 seconds.  Neurological:     Comments: Patient is unable to move lower extremities.  Not sure if it is due to weakness, pain or neurologic compromise.    Assessment Bilateral lower extremity pain Known DDD L5-S1 with severe left foraminal stenosis from MRI in 2018 Improving cellulitis left leg Bilateral lower extremity lymphedema Morbid Obesity with a BMI greater than 50  Plan: CT of Lumbar spine, pelvis and bilateral lower extremities to rule out spontaneous bleed causing neurologic compromise vs HNP vs occult fracture.  Definitive treatment pending the results of these scans  Linda Hedges 07/05/2021, 5:04 PM

## 2021-07-05 NOTE — Progress Notes (Signed)
PROGRESS NOTE    Katelyn Jackson  YQM:[id number] DOB: [birth year] DOA: 07/01/2021 PCP: Cassandria Anger, MD   Chief Complaint  Patient presents with   Dysuria   Brief Narrative: 75 year old female with multiple history including permanent A. fib on Coumadin, HTN, HLD, pacemaker in place, history of breast cancer, hypothyroidism, chronic bilateral lymphedema, recurrent fall, morbid obesity BMI 51, GERD, pulmonary hypertension , who needs full assistance for her daily life activities, has aid 6x/wk at home, brought to the ED by son for evaluation of recent fall on last Monday.Patient fell from bed on Monday since then complaining of left shoulder pain, left ankle pain and bilateral knee pain and unable to walk or stand go to bathroom and progressively declining.  Per admitting: Temperature 97.9, blood pressure 137/66, pulse: 99, respiratory rate 16, placed on 2 L of oxygen via nasal cannula, WBC of 13.2, H&H 10.1/32.2, platelet 107, creatinine: 1.99, BUN: 56, GFR: 26, INR 4.3 which trended up to 9.9 this morning, chest x-ray shows pulmonary edema.  On exam she was found to have left lower extremity redness.  Patient was started on Rocephin, vitamin K 2.5 mg p.o. once Lasix 40 mg IV once given in the ED along with morphine and Zofran.  [facility] consulted for admission for new onset oxygen requirement due to pulmonary edema, left lower extremity cellulitis and supratherapeutic INR. She was managed with IV antibiotics for cellulitis, IV Lasix monitoring of INR.  ABGs also showed CO2 retention-chronic hypercapnia compensated  Subjective: Complains of ongoing pain diffusely but more on the right hip today.   Seen and examined this morning. Overnight K3.3, INR up at 6.7. Complains of pain in lower extremities tender on palpation  Assessment & Plan:  Cellulitis of left lower extremity Chronic bilateral lymphedema Leukocytosis nicely improved.  On empiric ceftriaxone for  cellulitis-changed to oral Keflex to complete 3 more days. Leg erythema is better but is still tender.  Cont pain control  Morbid Obesity w./ BMI 51 Recurrent falls Ambulatory dysfunction Severe right and moderate to severe left knee ostearthritis and having pain Left ankle pain Rt hip pain: Had xray on knees and let ankle in ED no fracture. Rt hip on 8/22- DJD Pain has been chronic since many years with some worsening.  Notified ortho due to ongoing pain,. Continue pain control, tramadol at home, here on opiates, sent tylenol. PTOT as tolerated.  Mild confusion per son- CT head no acute findings. Checked abg-compensated mild hypercapnia PCO2 49.[name]  Continue supportive care suspect it was from opioid.  PTOT supportive care.  Acute hypoxic respiratory failure Fluid overload/pulmonary edema Acute on chronic systolic CHF-EF 52-84%, bnp 620 Chronic hypercapnia compensated, likely from OSA/OHS Weight is improving, having good diuresis tolerating Lasix.  We will continue on current IV Lasix, monitor weight . Cont strict intake output, daily weight supplemental oxygen to maintain saturation 92% and wean as tolerated.  Last echo reviewed from this year. Filed Weights   07/03/21 0500 07/04/21 0435 07/05/21 0500  Weight: 124.6 kg 126.2 kg 121.8 kg  Net IO Since Admission: -6,880.35 mL [07/05/21 1143]   Intake/Output Summary (Last 24 hours) at 07/05/2021 1143 Last data filed at 07/05/2021 1100 Gross per 24 hour  Intake 359.65 ml  Output 4450 ml  Net -4090.35 ml    AKI on CKD stage IIIb, baseline creatinine 1.4-1.6.  Creatinine better at 1.2 tolerating Lasix.Monitor while on lasix, cont to monitor. Recent Labs    03/01/21 0850 03/17/21 1703 03/18/21 0324 03/19/21 0335 03/20/21  0825 06/10/21 0906 07/01/21 1142 07/03/21 0541 07/04/21 0527 07/05/21 0430  BUN 16 17 18 22 22  30* 56* 58* 54* 43*  CREATININE 1.46* 1.67* 1.74* 1.84* 1.59* 1.85* 1.99* 1.79* 1.71* 1.28*   Essential  hypertension:BP controlled on home coreg Hypophosphatemia-Continue k phos.Resolved Hypokalemia-low.   Chronic atrial fibrillation chronic Coumadin Supratherapeutic INR Pacemaker in place Rate is controlled.  S/p vitamin k x in ED. INR again uptrending received 2.5 mg vitamin K this morning 8/29.  No obvious bleeding noted, continue to monitor INR daily.  Recent Labs  Lab 07/01/21 1142 07/02/21 0410 07/03/21 0541 07/04/21 0527 07/05/21 0430  INR 4.3* 9.9* 4.7* 6.7* 9.1*     Anemia of chronic disease: Baseline H&H 10.1,. hh/h slightly down at 9.9 Recent Labs  Lab 07/01/21 1142 07/03/21 0541 07/04/21 0527 07/05/21 0430  HGB 10.1* 10.4* 10.3* 9.9*  HCT 32.2* 34.2* 32.1* 31.2*     Hypothyroidism: Continue Synthroid.  Euthyroid  TSH 2.7 Thrombocytopenia-stable cont to  monitor Recent Labs  Lab 07/01/21 1142 07/03/21 0541 07/04/21 0527 07/05/21 0430  PLT 107* 113* 123* 138*   HLD  cont statin GERD cont PPI History of breast cancer  Pressure injury of skin POA SEE BELOW Diet Order             Diet 2 gram sodium Room service appropriate? Yes; Fluid consistency: Thin  Diet effective now                  Patient's Body mass index is 50.74 kg/m.  Pressure Injury 07/03/21 Thigh Posterior;Right Stage 3 -  Full thickness tissue loss. Subcutaneous fat may be visible but bone, tendon or muscle are NOT exposed. (Active)  07/03/21 0453  Location: Thigh  Location Orientation: Posterior;Right  Staging: Stage 3 -  Full thickness tissue loss. Subcutaneous fat may be visible but bone, tendon or muscle are NOT exposed.  Wound Description (Comments):   Present on Admission: Yes     Pressure Injury 07/03/21 Sacrum Stage 2 -  Partial thickness loss of dermis presenting as a shallow open injury with a red, pink wound bed without slough. (Active)  07/03/21 2105  Location: Sacrum  Location Orientation:   Staging: Stage 2 -  Partial thickness loss of dermis presenting as a  shallow open injury with a red, pink wound bed without slough.  Wound Description (Comments):   Present on Admission: Yes    DVT prophylaxis: SCDs Start: 07/02/21 0948, COUMADIN Code Status:   Code Status: Full Code  Family Communication: plan of care discussed with patient at bedside.  I discussed with patient's son at on the phone 8/27. Son was in the room this morning and he left for work  Status is: Inpatient Remains inpatient appropriate because:Unsafe d/c plan, IV treatments appropriate due to intensity of illness or inability to take PO, and Inpatient level of care appropriate due to severity of illness Dispo: The patient is from: Home              Anticipated d/c is to:  TBD              Patient currently is not medically stable to d/c.   Difficult to place patient No Unresulted Labs (From admission, onward)     Start     Ordered   07/04/21 0500  Protime-INR  Daily,   R      07/02/21 1153   07/04/21 0500  Comprehensive metabolic panel  Daily,   R  Question:  Specimen collection method  Answer:  Lab=Lab collect   07/03/21 0948   07/04/21 0500  CBC  Daily,   R     Question:  Specimen collection method  Answer:  Lab=Lab collect   07/03/21 0948           Medications reviewed:  Scheduled Meds:  carvedilol  25 mg Oral BID   cholecalciferol  2,000 Units Oral Daily   exemestane  25 mg Oral QPC breakfast   furosemide  40 mg Intravenous BID   levothyroxine  50 mcg Oral QAC breakfast   magnesium oxide  400 mg Oral BID   pantoprazole  40 mg Oral Daily   phosphorus  250 mg Oral BID   potassium chloride  10 mEq Oral Daily   pravastatin  20 mg Oral q1800   vitamin B-12  2,000 mcg Oral Daily   Continuous Infusions:  sodium chloride 250 mL (07/04/21 0822)   cefTRIAXone (ROCEPHIN)  IV 2 g (07/05/21 1056)   Consultants:see note  Procedures:see note Antimicrobials: Anti-infectives (From admission, onward)    Start     Dose/Rate Route Frequency Ordered Stop   07/03/21  0900  cefTRIAXone (ROCEPHIN) 2 g in sodium chloride 0.9 % 100 mL IVPB        2 g 200 mL/hr over 30 Minutes Intravenous Every 24 hours 07/02/21 0950 07/09/21 0859   07/02/21 0915  cefTRIAXone (ROCEPHIN) 2 g in sodium chloride 0.9 % 100 mL IVPB        2 g 200 mL/hr over 30 Minutes Intravenous  Once 07/02/21 5397 07/02/21 1033      Culture/Microbiology    Component Value Date/Time   SDES  07/01/2021 1439    URINE, CLEAN CATCH Performed at [facility], [place] [address], [place], [place] [address]    SPECREQUEST  07/01/2021 1439    NONE Performed at [facility], [place] [address], [place], [place] [address]    CULT MULTIPLE SPECIES PRESENT, SUGGEST RECOLLECTION (A) 07/01/2021 1439   REPTSTATUS 07/02/2021 FINAL 07/01/2021 1439    Other culture-see note  Objective: Vitals: Today's Vitals   07/04/21 2125 07/04/21 2236 07/05/21 0500 07/05/21 1100  BP: 136/65  128/74   Pulse: 76  79   Resp: 18  18   Temp: 99.3 F (37.4 C)  98.6 F (37 C)   TempSrc: Oral  Oral   SpO2: 98%  94%   Weight:   121.8 kg   Height:      PainSc:  Asleep  3     Intake/Output Summary (Last 24 hours) at 07/05/2021 1143 Last data filed at 07/05/2021 1100 Gross per 24 hour  Intake 359.65 ml  Output 4450 ml  Net -4090.35 ml    Filed Weights   07/03/21 0500 07/04/21 0435 07/05/21 0500  Weight: 124.6 kg 126.2 kg 121.8 kg   Weight change: -4.4 kg  Intake/Output from previous day: 08/28 0701 - 08/29 0700 In: 599.7 [P.O.:480; I.V.:19.7; IV Piggyback:100] Out: 5200 [Urine:5200] Intake/Output this shift: Total I/O In: -  Out: 500 [Urine:500] Filed Weights   07/03/21 0500 07/04/21 0435 07/05/21 0500  Weight: 124.6 kg 126.2 kg 121.8 kg   Examination: General exam: AAO, morbidly obese, in pain, HEENT:Oral mucosa moist, Ear/Nose WNL grossly, dentition normal. Respiratory system: bilaterally diminished, no use of accessory muscle Cardiovascular system: S1 & S2 +, No  JVD,. Gastrointestinal system: Abdomen soft,Obese, NT,ND, BS+ Nervous System:Alert, awake, moving extremities and grossly nonfocal Extremities: Chronic extremities lymphedema tenderness  diffusely on legs and right hip Skin: No rashes,no icterus. MSK: Normal muscle bulk,tone, power   Data Reviewed: I have personally reviewed following labs and imaging studies CBC: Recent Labs  Lab 07/01/21 1142 07/03/21 0541 07/04/21 0527 07/05/21 0430  WBC 13.2* 10.7* 8.2 8.2  NEUTROABS 11.7*  --   --   --   HGB 10.1* 10.4* 10.3* 9.9*  HCT 32.2* 34.2* 32.1* 31.2*  MCV 94.7 97.4 92.8 94.0  PLT 107* 113* 123* 138*    Basic Metabolic Panel: Recent Labs  Lab 07/01/21 1142 07/02/21 0949 07/03/21 0541 07/04/21 0527 07/05/21 0430  NA 137  --  143 139 141  K 4.0  --  3.7 3.3* 3.0*  CL 100  --  102 98 98  CO2 27  --  31 32 33*  GLUCOSE 91  --  104* 101* 108*  BUN 56*  --  58* 54* 43*  CREATININE 1.99*  --  1.79* 1.71* 1.28*  CALCIUM 10.0  --  10.3 9.8 9.6  MG  --  1.7  --   --   --   PHOS  --  2.1*  --  3.1  --     GFR: Estimated Creatinine Clearance: 46.4 mL/min (A) (by C-G formula based on SCr of 1.28 mg/dL (H)). Liver Function Tests: Recent Labs  Lab 07/03/21 0541 07/04/21 0527 07/05/21 0430  AST 13* 27 34  ALT 14 17 20   ALKPHOS 140* 230* 235*  BILITOT 3.6* 3.2* 3.1*  PROT 7.1 6.7 6.3*  ALBUMIN 2.7* 2.4* 2.4*    No results for input(s): LIPASE, AMYLASE in the last 168 hours. No results for input(s): AMMONIA in the last 168 hours. Coagulation Profile: Recent Labs  Lab 07/01/21 1142 07/02/21 0410 07/03/21 0541 07/04/21 0527 07/05/21 0430  INR 4.3* 9.9* 4.7* 6.7* 9.1*    Cardiac Enzymes: No results for input(s): CKTOTAL, CKMB, CKMBINDEX, TROPONINI in the last 168 hours. BNP (last 3 results) No results for input(s): PROBNP in the last 8760 hours. HbA1C: No results for input(s): HGBA1C in the last 72 hours. CBG: No results for input(s): GLUCAP in the last 168  hours. Lipid Profile: No results for input(s): CHOL, HDL, LDLCALC, TRIG, CHOLHDL, LDLDIRECT in the last 72 hours. Thyroid Function Tests: Recent Labs    07/02/21 1235  TSH 2.747    Anemia Panel: No results for input(s): VITAMINB12, FOLATE, FERRITIN, TIBC, IRON, RETICCTPCT in the last 72 hours. Sepsis Labs: No results for input(s): PROCALCITON, LATICACIDVEN in the last 168 hours.  Recent Results (from the past 240 hour(s))  Urine Culture     Status: Abnormal   Collection Time: 07/01/21  2:39 PM   Specimen: Urine, Clean Catch  Result Value Ref Range Status   Specimen Description   Final    URINE, CLEAN CATCH Performed at [facility], [facility], [place] [address] [place], [place] [address]    Special Requests   Final    NONE Performed at [facility], [place] [address] [place], [place] [address]    Culture MULTIPLE SPECIES PRESENT, SUGGEST RECOLLECTION (A)  Final   Report Status 07/02/2021 FINAL  Final  SARS CORONAVIRUS 2 (TAT 6-24 HRS) Nasopharyngeal Nasopharyngeal Swab     Status: None   Collection Time: 07/02/21 11:19 AM   Specimen: Nasopharyngeal Swab  Result Value Ref Range Status   SARS Coronavirus 2 NEGATIVE NEGATIVE Final    Comment: (NOTE) SARS-CoV-2 target nucleic acids are NOT DETECTED.  The SARS-CoV-2 RNA is generally detectable  in upper and lower respiratory specimens during the acute phase of infection. Negative results do not preclude SARS-CoV-2 infection, do not rule out co-infections with other pathogens, and should not be used as the sole basis for treatment or other patient management decisions. Negative results must be combined with clinical observations, patient history, and epidemiological information. The expected result is Negative.  Fact Sheet for Patients: SugarRoll.be  Fact Sheet for Healthcare Providers: https://www.woods-mathews.com/  This test is not yet approved or cleared  by the [place] FDA and  has been authorized for detection and/or diagnosis of SARS-CoV-2 by FDA under an Emergency Use Authorization (EUA). This EUA will remain  in effect (meaning this test can be used) for the duration of the COVID-19 declaration under Se ction 564(b)(1) of the Act, 21 U.S.C. section 360bbb-3(b)(1), unless the authorization is terminated or revoked sooner.  Performed at [facility], [place] [address] [place], [place] [address]       Radiology Studies: No results found.   LOS: 3 days   Antonieta Pert, MD [facility]  07/05/2021, 11:43 AM

## 2021-07-05 NOTE — Progress Notes (Signed)
Critical lab INR of 9.1 was called on the patient. MD paged to notify.

## 2021-07-05 NOTE — TOC Progression Note (Signed)
Transition of Care [facility]) - Progression Note    Patient Details  Name: Katelyn Jackson MRN: [id number] Date of Birth: 1946/01/12  Transition of Care [facility]) CM/SW Contact  Ross Ludwig, [place] Phone Number: 07/05/2021, 1:33 PM  Clinical Narrative:     CSW sent updated clinicals to different SNFs waiting for bed offers.  Patient will need insurance auth prior to discharge.     Barriers to Discharge: No Barriers Identified  Expected Discharge Plan and Services                                                 Social Determinants of Health (SDOH) Interventions    Readmission Risk Interventions Readmission Risk Prevention Plan 03/19/2021  Transportation Screening Complete  HRI or Home Care Consult Complete  Social Work Consult for [place] Planning/Counseling Complete  Palliative Care Screening Not Applicable  Medication Review [employer] photographer) Complete  Some recent data might be hidden

## 2021-07-06 ENCOUNTER — Inpatient Hospital Stay (HOSPITAL_COMMUNITY): Payer: Medicare HMO

## 2021-07-06 DIAGNOSIS — L03116 Cellulitis of left lower limb: Secondary | ICD-10-CM | POA: Diagnosis not present

## 2021-07-06 LAB — COMPREHENSIVE METABOLIC PANEL
ALT: 27 U/L (ref 0–44)
AST: 49 U/L — ABNORMAL HIGH (ref 15–41)
Albumin: 2.2 g/dL — ABNORMAL LOW (ref 3.5–5.0)
Alkaline Phosphatase: 317 U/L — ABNORMAL HIGH (ref 38–126)
Anion gap: 8 (ref 5–15)
BUN: 35 mg/dL — ABNORMAL HIGH (ref 8–23)
CO2: 33 mmol/L — ABNORMAL HIGH (ref 22–32)
Calcium: 9.3 mg/dL (ref 8.9–10.3)
Chloride: 97 mmol/L — ABNORMAL LOW (ref 98–111)
Creatinine, Ser: 1.18 mg/dL — ABNORMAL HIGH (ref 0.44–1.00)
GFR, Estimated: 48 mL/min — ABNORMAL LOW (ref >60)
Glucose, Bld: 113 mg/dL — ABNORMAL HIGH (ref 70–99)
Potassium: 3.2 mmol/L — ABNORMAL LOW (ref 3.5–5.1)
Sodium: 138 mmol/L (ref 135–145)
Total Bilirubin: 3.2 mg/dL — ABNORMAL HIGH (ref 0.3–1.2)
Total Protein: 6.3 g/dL — ABNORMAL LOW (ref 6.5–8.1)

## 2021-07-06 LAB — PROTIME-INR
INR: 2.1 — ABNORMAL HIGH (ref 0.8–1.2)
Prothrombin Time: 23.9 seconds — ABNORMAL HIGH (ref 11.4–15.2)

## 2021-07-06 LAB — CBC
HCT: 36.8 % (ref 36.0–46.0)
Hemoglobin: 11.8 g/dL — ABNORMAL LOW (ref 12.0–15.0)
MCH: 29.4 pg (ref 26.0–34.0)
MCHC: 32.1 g/dL (ref 30.0–36.0)
MCV: 91.5 fL (ref 80.0–100.0)
Platelets: 136 10*3/uL — ABNORMAL LOW (ref 150–400)
RBC: 4.02 MIL/uL (ref 3.87–5.11)
RDW: 16.8 % — ABNORMAL HIGH (ref 11.5–15.5)
WBC: 7.5 10*3/uL (ref 4.0–10.5)
nRBC: 0 % (ref 0.0–0.2)

## 2021-07-06 MED ORDER — POTASSIUM CHLORIDE CRYS ER 20 MEQ PO TBCR
40.0000 meq | EXTENDED_RELEASE_TABLET | Freq: Once | ORAL | Status: AC
  Administered 2021-07-06: 40 meq via ORAL
  Filled 2021-07-06: qty 2

## 2021-07-06 MED ORDER — WARFARIN - PHARMACIST DOSING INPATIENT: Freq: Every day | Status: DC

## 2021-07-06 MED ORDER — WARFARIN SODIUM 2.5 MG PO TABS
2.5000 mg | ORAL_TABLET | Freq: Once | ORAL | Status: AC
  Administered 2021-07-06: 2.5 mg via ORAL
  Filled 2021-07-06: qty 1

## 2021-07-06 MED ORDER — POTASSIUM CHLORIDE CRYS ER 20 MEQ PO TBCR
20.0000 meq | EXTENDED_RELEASE_TABLET | Freq: Two times a day (BID) | ORAL | Status: DC
  Administered 2021-07-06 – 2021-07-09 (×6): 20 meq via ORAL
  Filled 2021-07-06 (×6): qty 1

## 2021-07-06 NOTE — Progress Notes (Signed)
OT Cancellation Note  Patient Details Name: Katelyn Jackson MRN: [id number] DOB: November 05, 1946   Cancelled Treatment:    Reason Eval/Treat Not Completed: Medical issues which prohibited therapy Ortho note stated waiting for CT for further diagnostics. Will await results of scans prior to treatment. Will continue to follow.   [name] OTR/L, MS Acute Rehabilitation Department Office# [phone] Pager# [phone]   [place] 07/06/2021, 11:09 AM

## 2021-07-06 NOTE — Progress Notes (Signed)
[place] for  warfarin Indication:  hx atrial fibrillation  Allergies  Allergen Reactions   Chlorhexidine Gluconate Hives   Penicillins Itching    Has patient had a PCN reaction causing immediate rash, facial/tongue/throat swelling, SOB or lightheadedness with hypotension: no Has patient had a PCN reaction causing severe rash involving mucus membranes or skin necrosis: No Has patient had a PCN reaction that required hospitalization: No Has patient had a PCN reaction occurring within the last 10 years: No If all of the above answers are "NO", then may proceed with Cephalosporin use.  Tolerated Cephalosporin Date: 03/17/21.     Patient Measurements: Height: 5\' 1"  (154.9 cm) Weight: 121.8 kg (268 lb 8.3 oz) IBW/kg (Calculated) : 47.8 Heparin Dosing Weight:   Vital Signs: Temp: 99 F (37.2 C) (08/30 0610) Temp Source: Oral (08/30 0610) BP: 144/57 (08/30 0610) Pulse Rate: 57 (08/30 0610)  Labs: Recent Labs    07/04/21 0527 07/05/21 0430 07/06/21 0515  HGB 10.3* 9.9* 11.8*  HCT 32.1* 31.2* 36.8  PLT 123* 138* 136*  LABPROT 58.1* 74.3* 23.9*  INR 6.7* 9.1* 2.1*  CREATININE 1.71* 1.28* 1.18*     Estimated Creatinine Clearance: 50.3 mL/min (A) (by C-G formula based on SCr of 1.18 mg/dL (H)).   Medications:  - on warfarin 2.5mg  daily except 5mg  on Tue PTA (last dose taken on 8/24)  Assessment: Patient is a 75 y.o F with hx afib on warfarin PTA, presented to the ED on 8/25 with failure to thrive, She also reported of a fall one week ago and reports poor oral intake afterwards. Head CT was negative for any acute abnormality.  Pharmacy has been consulted to dose warfarin while she's hospitalized.  Warfarin held 8/27 with elevated INR  Today, 07/06/2021: INR decreased to 2.1 (VitK 2.5mg  po on 8/26 & 8/29)   Goal of Therapy:  INR 2-3 Monitor platelets by anticoagulation protocol: Yes   Plan:  Warfarin 2.5mg  today at 1600 Daily  INR Monitor for s/sx bleeding  Minda Ditto PharmD [facility] Rx [id number] 07/06/2021, 12:21 PM

## 2021-07-06 NOTE — Progress Notes (Addendum)
PROGRESS NOTE    Katelyn Jackson  DXI:[id number] DOB: 26-Mar-1946 DOA: 07/01/2021 PCP: Cassandria Anger, MD   Chief Complaint  Patient presents with   Dysuria   Brief Narrative: 75 year old female with multiple history including permanent A. fib on Coumadin, HTN, HLD, pacemaker in place, history of breast cancer, hypothyroidism, chronic bilateral lymphedema, recurrent fall, morbid obesity BMI 51, GERD, pulmonary hypertension , who needs full assistance for her daily life activities, has aid 6x/wk at home, brought to the ED by son for evaluation of recent fall on last Monday.Patient fell from bed on Monday since then complaining of left shoulder pain, left ankle pain and bilateral knee pain and unable to walk or stand go to bathroom and progressively declining.  Per admitting: Temperature 97.9, blood pressure 137/66, pulse: 99, respiratory rate 16, placed on 2 L of oxygen via nasal cannula, WBC of 13.2, H&H 10.1/32.2, platelet 107, creatinine: 1.99, BUN: 56, GFR: 26, INR 4.3 which trended up to 9.9 this morning, chest x-ray shows pulmonary edema.  On exam she was found to have left lower extremity redness.  Patient was started on Rocephin, vitamin K 2.5 mg p.o. once Lasix 40 mg IV once given in the ED along with morphine and Zofran.  [facility] consulted for admission for new onset oxygen requirement due to pulmonary edema, left lower extremity cellulitis and supratherapeutic INR. She was managed with IV antibiotics for cellulitis, IV Lasix monitoring of INR.  ABGs also showed CO2 retention-chronic hypercapnia compensated  Subjective: She appears more alert and awake today.  No right hip pain but pain in the right knee and the right ankle  H&H stable INR improved  Assessment & Plan:  Cellulitis of left lower extremity Chronic bilateral lymphedema Leukocytosis nicely improved.  On empiric ceftriaxone for cellulitis-changed to oral Duricef to complete 3 more days end 07/08/21.Leg  erythema is better but is still tender.  Cont pain control  Morbid Obesity w./ BMI 51 Recurrent falls Ambulatory dysfunction Severe right and moderate to severe left knee ostearthritis and having pain Left ankle pain Rt hip pain: Had xray on knees and let ankle in ED no fracture. Rt hip on 8/22- DJD Pain has been chronic since many years with some worsening. Dr [name] consulted from orthopedic  and CT right left knee, CT pelvis and CT lumbar spine pending at this time to evaluate for her pain occult fracture. H&H is a stable , INR improved-low suspicion for bleeding.Continue pain control with oral and IV opiates continue PT OT will need a skilled nursing facility.  She has had previous intra-articular injection for her severe pain and arthritis in the past  Mild confusion per son- CT head no acute findings. Checked abg-compensated mild hypercapnia PCO2 49.  Mentation appears stable.  Continue supportive care suspect it was from opioid.  PTOT supportive care.  Acute hypoxic respiratory failure Fluid overload/pulmonary edema Acute on chronic systolic CHF-EF 76-73% bnp 620 Chronic hypercapnia compensated, likely from OSA/OHS E cho 02/24/36:[id number] LV diastolic parameters mildly enlarged RV and mildly elevated pulmonary artery systolic pressure with severely dilated left atrium right atrium severe ME. Respiratory status much improved.  Renal function remains a stable and tolerating Lasix IV and will continue the same.  Monitor intake output Daily weight as below , so far total net -9.3 L.  Last echo reviewed shows lvef Filed Weights   07/03/21 0500 07/04/21 0435 07/05/21 0500  Weight: 124.6 kg 126.2 kg 121.8 kg  Net IO Since Admission: -9,385.35 mL [07/06/21 1112]  Intake/Output Summary (Last 24 hours) at 07/06/2021 1112 Last data filed at 07/06/2021 0930 Gross per 24 hour  Intake 120 ml  Output 2625 ml  Net -2505 ml    AKI on CKD stage IIIb, baseline creatinine 1.4-1.6.  Creatinine  improved with IV Lasix and stable.   Monitor Recent Labs    03/17/21 1703 03/18/21 0324 03/19/21 0335 03/20/21 0825 06/10/21 0906 07/01/21 1142 07/03/21 0541 07/04/21 0527 07/05/21 0430 07/06/21 0515  BUN _0 30* 56* 58* 54* 43* 35*  CREATININE 1.67* 1.74* 1.84* 1.59* 1.85* 1.99* 1.79* 1.71* 1.28* 1.18*   Essential hypertension:BP controlled on home coreg Hypophosphatemia-Continue k phos.Resolved Hypokalemia-low-replacement ordered and added daily scheduled potassium.   Chronic atrial fibrillation chronic Coumadin Supratherapeutic INR Pacemaker in place Rate is controlled.  S/p vitamin k x in ED. INR again uptrended 9.1- s/p 2.5 mg vitamin K 8/29, INR at 2.1.  Consult pharmacy will likely need to start Coumadin at lower dose today if no procedure from Orthopedics. Monitor INR daily.  Recent Labs  Lab 07/02/21 0410 07/03/21 0541 07/04/21 0527 07/05/21 0430 07/06/21 0515  INR 9.9* 4.7* 6.7* 9.1* 2.1*   Anemia of chronic disease: Baseline H&H 10.1, hemoglobin is stable.  Recent Labs  Lab 07/01/21 1142 07/03/21 0541 07/04/21 0527 07/05/21 0430 07/06/21 0515  HGB 10.1* 10.4* 10.3* 9.9* 11.8*  HCT 32.2* 34.2* 32.1* 31.2* 36.8     Hypothyroidism: Continue Synthroid.  Euthyroid  TSH 2.7 Thrombocytopenia-stable cont to  monitor Recent Labs  Lab 07/01/21 1142 07/03/21 0541 07/04/21 0527 07/05/21 0430 07/06/21 0515  PLT 107* 113* 123* 138* 136*   HLD  cont statin GERD cont PPI History of breast cancer  Pressure injury of skin POA SEE BELOW Diet Order             Diet 2 gram sodium Room service appropriate? No; Fluid consistency: Thin  Diet effective now                  Patient's Body mass index is 50.74 kg/m.  Pressure Injury 07/03/21 Thigh Posterior;Right Stage 3 -  Full thickness tissue loss. Subcutaneous fat may be visible but bone, tendon or muscle are NOT exposed. (Active)  07/03/21 0453  Location: Thigh  Location Orientation:  Posterior;Right  Staging: Stage 3 -  Full thickness tissue loss. Subcutaneous fat may be visible but bone, tendon or muscle are NOT exposed.  Wound Description (Comments):   Present on Admission: Yes     Pressure Injury 07/03/21 Sacrum Stage 2 -  Partial thickness loss of dermis presenting as a shallow open injury with a red, pink wound bed without slough. (Active)  07/03/21 2105  Location: Sacrum  Location Orientation:   Staging: Stage 2 -  Partial thickness loss of dermis presenting as a shallow open injury with a red, pink wound bed without slough.  Wound Description (Comments):   Present on Admission: Yes    DVT prophylaxis: SCDs Start: 07/02/21 0948, COUMADIN Code Status:   Code Status: Full Code  Family Communication: plan of care discussed with patient at bedside.  I HAD discussed with patient's son OVER PHONE.  Met w/ son personally at the bedside and updated plan of care 8/30  Status is: Inpatient Remains inpatient appropriate because:Unsafe d/c plan, IV treatments appropriate due to intensity of illness or inability to take PO, and Inpatient level of care appropriate due to severity of illness Dispo: The patient is from: Home  Anticipated d/c is to: Skilled nursing facility-offer received but will need authorization and need negative COVID within 48 hours prior to  discharge.              Patient currently is not medically stable to d/c.   Difficult to place patient No Unresulted Labs (From admission, onward)     Start     Ordered   07/04/21 0500  Protime-INR  Daily,   R      07/02/21 1153           Medications reviewed:  Scheduled Meds:  carvedilol  25 mg Oral BID   cefadroxil  500 mg Oral BID   cholecalciferol  2,000 Units Oral Daily   exemestane  25 mg Oral QPC breakfast   furosemide  40 mg Intravenous BID   levothyroxine  50 mcg Oral QAC breakfast   magnesium oxide  400 mg Oral BID   pantoprazole  40 mg Oral Daily   potassium chloride  10 mEq Oral  Daily   potassium chloride  40 mEq Oral Once   pravastatin  20 mg Oral q1800   vitamin B-12  2,000 mcg Oral Daily   Continuous Infusions:  sodium chloride 250 mL (07/04/21 0822)   Consultants:see note  Procedures:see note Antimicrobials: Anti-infectives (From admission, onward)    Start     Dose/Rate Route Frequency Ordered Stop   07/05/21 1245  cefadroxil (DURICEF) capsule 500 mg        500 mg Oral 2 times daily 07/05/21 1155 07/08/21 0959   07/03/21 0900  cefTRIAXone (ROCEPHIN) 2 g in sodium chloride 0.9 % 100 mL IVPB  Status:  Discontinued        2 g 200 mL/hr over 30 Minutes Intravenous Every 24 hours 07/02/21 0950 07/05/21 1155   07/02/21 0915  cefTRIAXone (ROCEPHIN) 2 g in sodium chloride 0.9 % 100 mL IVPB        2 g 200 mL/hr over 30 Minutes Intravenous  Once 07/02/21 1914 07/02/21 1033      Culture/Microbiology    Component Value Date/Time   SDES  07/01/2021 1439    URINE, CLEAN CATCH Performed at [facility], [place] [address] [place], [place] [address]    SPECREQUEST  07/01/2021 1439    NONE Performed at [facility], [place] [address], [place], [place] [address]    CULT MULTIPLE SPECIES PRESENT, SUGGEST RECOLLECTION (A) 07/01/2021 1439   REPTSTATUS 07/02/2021 FINAL 07/01/2021 1439    Other culture-see note  Objective: Vitals: Today's Vitals   07/06/21 0610 07/06/21 0611 07/06/21 0655 07/06/21 0859  BP: (!) 144/57     Pulse: (!) 57     Resp: 20     Temp: 99 F (37.2 C)     TempSrc: Oral     SpO2: 94%     Weight:      Height:      PainSc:  4  Asleep 3     Intake/Output Summary (Last 24 hours) at 07/06/2021 1112 Last data filed at 07/06/2021 0930 Gross per 24 hour  Intake 120 ml  Output 2625 ml  Net -2505 ml    Filed Weights   07/03/21 0500 07/04/21 0435 07/05/21 0500  Weight: 124.6 kg 126.2 kg 121.8 kg   Weight change:   Intake/Output from previous day: 08/29 0701 - 08/30 0700 In: -  Out: 2600  [Urine:2600] Intake/Output this shift: Total I/O In: 120 [P.O.:120] Out: 525 [Urine:525] Filed Weights   07/03/21 0500  07/04/21 0435 07/05/21 0500  Weight: 124.6 kg 126.2 kg 121.8 kg   Examination: General exam: AAO at baseline, pleasant, morbidly obese, in mild pain. HEENT:Oral mucosa moist, Ear/Nose WNL grossly, dentition normal. Respiratory system: bilaterally diminished, difficult to auscultate due to body habitus.  No use of accessory muscle Cardiovascular system: S1 & S2 +, No JVD,. Gastrointestinal system: Abdomen soft, obese NT,ND, BS+ Nervous System:Alert, awake, moving extremities and grossly nonfocal Extremities: Bilateral extensive lymphedema present with diffuse tenderness no more erythema.  Skin: No rashes,no icterus. MSK: Normal muscle bulk,tone, power   Data Reviewed: I have personally reviewed following labs and imaging studies CBC: Recent Labs  Lab 07/01/21 1142 07/03/21 0541 07/04/21 0527 07/05/21 0430 07/06/21 0515  WBC 13.2* 10.7* 8.2 8.2 7.5  NEUTROABS 11.7*  --   --   --   --   HGB 10.1* 10.4* 10.3* 9.9* 11.8*  HCT 32.2* 34.2* 32.1* 31.2* 36.8  MCV 94.7 97.4 92.8 94.0 91.5  PLT 107* 113* 123* 138* 136*    Basic Metabolic Panel: Recent Labs  Lab 07/01/21 1142 07/02/21 0949 07/03/21 0541 07/04/21 0527 07/05/21 0430 07/06/21 0515  NA 137  --  143 139 141 138  K 4.0  --  3.7 3.3* 3.0* 3.2*  CL 100  --  102 98 98 97*  CO2 27  --  31 32 33* 33*  GLUCOSE 91  --  104* 101* 108* 113*  BUN 56*  --  58* 54* 43* 35*  CREATININE 1.99*  --  1.79* 1.71* 1.28* 1.18*  CALCIUM 10.0  --  10.3 9.8 9.6 9.3  MG  --  1.7  --   --   --   --   PHOS  --  2.1*  --  3.1  --   --     GFR: Estimated Creatinine Clearance: 50.3 mL/min (A) (by C-G formula based on SCr of 1.18 mg/dL (H)). Liver Function Tests: Recent Labs  Lab 07/03/21 0541 07/04/21 0527 07/05/21 0430 07/06/21 0515  AST 13* 27 34 49*  ALT _0 ALKPHOS 140* 230* 235* 317*  BILITOT  3.6* 3.2* 3.1* 3.2*  PROT 7.1 6.7 6.3* 6.3*  ALBUMIN 2.7* 2.4* 2.4* 2.2*    No results for input(s): LIPASE, AMYLASE in the last 168 hours. No results for input(s): AMMONIA in the last 168 hours. Coagulation Profile: Recent Labs  Lab 07/02/21 0410 07/03/21 0541 07/04/21 0527 07/05/21 0430 07/06/21 0515  INR 9.9* 4.7* 6.7* 9.1* 2.1*    Cardiac Enzymes: No results for input(s): CKTOTAL, CKMB, CKMBINDEX, TROPONINI in the last 168 hours. BNP (last 3 results) No results for input(s): PROBNP in the last 8760 hours. HbA1C: No results for input(s): HGBA1C in the last 72 hours. CBG: No results for input(s): GLUCAP in the last 168 hours. Lipid Profile: No results for input(s): CHOL, HDL, LDLCALC, TRIG, CHOLHDL, LDLDIRECT in the last 72 hours. Thyroid Function Tests: No results for input(s): TSH, T4TOTAL, FREET4, T3FREE, THYROIDAB in the last 72 hours.  Anemia Panel: No results for input(s): VITAMINB12, FOLATE, FERRITIN, TIBC, IRON, RETICCTPCT in the last 72 hours. Sepsis Labs: No results for input(s): PROCALCITON, LATICACIDVEN in the last 168 hours.  Recent Results (from the past 240 hour(s))  Urine Culture     Status: Abnormal   Collection Time: 07/01/21  2:39 PM   Specimen: Urine, Clean Catch  Result Value Ref Range Status   Specimen Description   Final    URINE, CLEAN CATCH Performed at [facility]  [place] [address], [place], [place] [address]    Special Requests   Final    NONE Performed at [facility], [place] [address], [place], [place] [address]    Culture MULTIPLE SPECIES PRESENT, SUGGEST RECOLLECTION (A)  Final   Report Status 07/02/2021 FINAL  Final  SARS CORONAVIRUS 2 (TAT 6-24 HRS) Nasopharyngeal Nasopharyngeal Swab     Status: None   Collection Time: 07/02/21 11:19 AM   Specimen: Nasopharyngeal Swab  Result Value Ref Range Status   SARS Coronavirus 2 NEGATIVE NEGATIVE Final    Comment: (NOTE) SARS-CoV-2 target nucleic  acids are NOT DETECTED.  The SARS-CoV-2 RNA is generally detectable in upper and lower respiratory specimens during the acute phase of infection. Negative results do not preclude SARS-CoV-2 infection, do not rule out co-infections with other pathogens, and should not be used as the sole basis for treatment or other patient management decisions. Negative results must be combined with clinical observations, patient history, and epidemiological information. The expected result is Negative.  Fact Sheet for Patients: SugarRoll.be  Fact Sheet for Healthcare Providers: https://www.woods-mathews.com/  This test is not yet approved or cleared by the [place] FDA and  has been authorized for detection and/or diagnosis of SARS-CoV-2 by FDA under an Emergency Use Authorization (EUA). This EUA will remain  in effect (meaning this test can be used) for the duration of the COVID-19 declaration under Se ction 564(b)(1) of the Act, 21 U.S.C. section 360bbb-3(b)(1), unless the authorization is terminated or revoked sooner.  Performed at [facility], [place] [address] [place], [place] [address]       Radiology Studies: No results found.   LOS: 4 days   Antonieta Pert, MD [facility]  07/06/2021, 11:12 AM

## 2021-07-06 NOTE — TOC Progression Note (Addendum)
Transition of Care [facility]) - Progression Note    Patient Details  Name: Katelyn Jackson MRN: [id number] Date of Birth: [birth year]  Transition of Care [facility]) CM/SW Contact  Jonda Alanis, Juliann Pulse, RN Phone Number: 07/06/2021, 11:45 AM  Clinical Narrative: Provided son [name] w/bed offers-await choice. Await CT results,& inr. Will need auth, & covid closer to medical stability.Per son [name]-patient not vaccinated & currently decline getting covid vaccine.       Expected Discharge Plan: Skilled Nursing Facility Barriers to Discharge: Continued Medical Work up  Expected Discharge Plan and Services Expected Discharge Plan: [place]                                               Social Determinants of Health (SDOH) Interventions    Readmission Risk Interventions Readmission Risk Prevention Plan 03/19/2021  Transportation Screening Complete  HRI or [place] Complete  Social Work Consult for [facility] Planning/Counseling Complete  Palliative Care Screening Not Applicable  Medication Review [employer]) Complete  Some recent data might be hidden

## 2021-07-06 NOTE — Progress Notes (Signed)
PT Cancellation Note  Patient Details Name: Katelyn Jackson MRN: [id number] DOB: [birth year]   Cancelled Treatment:    Reason Eval/Treat Not Completed: Medical issues which prohibited therapy (holding PT. Per ortho note: CT of Lumbar spine, pelvis and bilateral lower extremities to rule out spontaneous bleed causing neurologic compromise vs HNP vs occult fracture.  Definitive treatment pending the results of these scans. CT  pending. Following)  Philomena Doheny PT 07/06/2021  Acute Rehabilitation Services Pager [phone] Office [phone]

## 2021-07-06 NOTE — Care Management Important Message (Signed)
Important Message  Patient Details IM Letter placed in Patient's room. Name: Katelyn Jackson MRN: [id number] Date of Birth: [birth year]   Medicare Important Message Given:  Yes     Kerin Salen 07/06/2021, 11:52 AM

## 2021-07-07 ENCOUNTER — Ambulatory Visit: Payer: Medicare HMO

## 2021-07-07 ENCOUNTER — Inpatient Hospital Stay (HOSPITAL_COMMUNITY): Payer: Medicare HMO

## 2021-07-07 DIAGNOSIS — R609 Edema, unspecified: Secondary | ICD-10-CM | POA: Diagnosis not present

## 2021-07-07 DIAGNOSIS — L538 Other specified erythematous conditions: Secondary | ICD-10-CM | POA: Diagnosis not present

## 2021-07-07 DIAGNOSIS — L03116 Cellulitis of left lower limb: Secondary | ICD-10-CM | POA: Diagnosis not present

## 2021-07-07 LAB — CBC
HCT: 33.5 % — ABNORMAL LOW (ref 36.0–46.0)
Hemoglobin: 10.6 g/dL — ABNORMAL LOW (ref 12.0–15.0)
MCH: 29.2 pg (ref 26.0–34.0)
MCHC: 31.6 g/dL (ref 30.0–36.0)
MCV: 92.3 fL (ref 80.0–100.0)
Platelets: 182 10*3/uL (ref 150–400)
RBC: 3.63 MIL/uL — ABNORMAL LOW (ref 3.87–5.11)
RDW: 16.6 % — ABNORMAL HIGH (ref 11.5–15.5)
WBC: 8.7 10*3/uL (ref 4.0–10.5)
nRBC: 0 % (ref 0.0–0.2)

## 2021-07-07 LAB — BASIC METABOLIC PANEL
Anion gap: 8 (ref 5–15)
BUN: 36 mg/dL — ABNORMAL HIGH (ref 8–23)
CO2: 35 mmol/L — ABNORMAL HIGH (ref 22–32)
Calcium: 9.8 mg/dL (ref 8.9–10.3)
Chloride: 99 mmol/L (ref 98–111)
Creatinine, Ser: 1.35 mg/dL — ABNORMAL HIGH (ref 0.44–1.00)
GFR, Estimated: 41 mL/min — ABNORMAL LOW (ref >60)
Glucose, Bld: 125 mg/dL — ABNORMAL HIGH (ref 70–99)
Potassium: 3.7 mmol/L (ref 3.5–5.1)
Sodium: 142 mmol/L (ref 135–145)

## 2021-07-07 LAB — PROTIME-INR
INR: 2.7 — ABNORMAL HIGH (ref 0.8–1.2)
Prothrombin Time: 28.7 seconds — ABNORMAL HIGH (ref 11.4–15.2)

## 2021-07-07 MED ORDER — WARFARIN SODIUM 2 MG PO TABS
2.0000 mg | ORAL_TABLET | Freq: Once | ORAL | Status: AC
  Administered 2021-07-07: 2 mg via ORAL
  Filled 2021-07-07: qty 1

## 2021-07-07 NOTE — Progress Notes (Signed)
Right upper extremity venous duplex completed. Refer to "CV Proc" under chart review to view preliminary results.  07/07/2021 12:49 PM Kelby Aline., MHA, RVT, RDCS, RDMS

## 2021-07-07 NOTE — Evaluation (Signed)
Occupational Therapy Evaluation Patient Details Name: Katelyn Jackson MRN: [id number] DOB: 09/30/1946 Today's Date: 07/07/2021    History of Present Illness Katelyn Jackson is a 75 y.o. female brought by son to the ER for further evaluation of recent fall. Patient's son at the bedside is the historian:.  He tells me that patient fell from the bed on Monday and since then she is complaining of left shoulder pain, left ankle pain and bilateral knee pain, she is unable to walk, stand, go to bathroom and progressively declining. PMH: permanent A. fib, HTN, hyperlipidemia, s/p pacemaker placement, breast cancer, hypothyroidism, chronic bilateral lymphedema, recurrent falls, obesity, GERD, pulmonary hypertension   Clinical Impression   Patient is a 75 year old female who was admitted for above. Patient was living at home with MI for ADLs per son report. Currently, patient is +3 for movement to edge of bed. Patient was noted to have increased pain, hard edema in right anterior shoulder. Nursing made aware. Patient is not safe to transtion home at current assist level with increased pain with all movement. Patient would continue to benefit from skilled OT services at this time while admitted and after d/c to address noted deficits in order to improve overall safety and independence in ADLs.      Follow Up Recommendations  SNF    Equipment Recommendations  Other (comment)    Recommendations for Other Services       Precautions / Restrictions Precautions Precautions: Fall Precaution Comments: leg pain, right shoulder TSA now painful-negative for fx/dislocation Restrictions Weight Bearing Restrictions: No Other Position/Activity Restrictions: speaks russian      Mobility Bed Mobility Overal bed mobility: Needs Assistance Bed Mobility: Supine to Sit Rolling: Total assist   Supine to sit: Total assist;HOB elevated     General bed mobility comments: HOB raised, +3 to  move legs and  raise trunk and slide to bed edge using bed pads. +3 to lift legs and return to supine due to body habityus    Transfers                 General transfer comment: unable    Balance Overall balance assessment: Needs assistance;History of Falls Sitting-balance support: Bilateral upper extremity supported;Feet unsupported Sitting balance-Leahy Scale: Poor Sitting balance - Comments: once  moved to bed edge, able to maintain static sitting, drank from cup w/ straw.                                   ADL either performed or assessed with clinical judgement   ADL Overall ADL's : Needs assistance/impaired Eating/Feeding: Sitting;Minimal assistance Eating/Feeding Details (indicate cue type and reason): patient was able to hold cup of water with LUE and bring up to mouth for few sips. Grooming: Oral care;Wash/dry face;Moderate assistance;Sitting Grooming Details (indicate cue type and reason): patients pain was limited participation in Upper Body Bathing: Bed level;Total assistance   Lower Body Bathing: Total assistance;Bed level   Upper Body Dressing : Bed level;Total assistance   Lower Body Dressing: Total assistance;Bed level     Toilet Transfer Details (indicate cue type and reason): standing was deferred on this date with patients current pain level and need for +3 to sit on edge of bed. Toileting- Clothing Manipulation and Hygiene: Bed level;Total assistance;+2 for physical assistance       Functional mobility during ADLs: +2 for physical assistance;+2 for safety/equipment;Total assistance  Vision Patient Visual Report: No change from baseline       Perception     Praxis      Pertinent Vitals/Pain Pain Location: bil knees, L ankle and R shoulder Pain Descriptors / Indicators: Grimacing;Moaning;Guarding Pain Intervention(s): Monitored during session     Hand Dominance Right   Extremity/Trunk Assessment Upper Extremity Assessment Upper  Extremity Assessment: RUE deficits/detail RUE Deficits / Details: patient was noted to have redness and firm edema anterior glenohumeral joint hear old scar from total shoudler. patient unable to tolerate movement to UE with increased emdema noted in entire UE. nurse made aware. RUE: Unable to fully assess due to pain RUE Coordination: decreased fine motor;decreased gross motor   Lower Extremity Assessment Lower Extremity Assessment: Defer to PT evaluation   Cervical / Trunk Assessment Cervical / Trunk Assessment: Normal   Communication Communication Communication: Prefers language other than English (russian)   Cognition Arousal/Alertness: Awake/alert Behavior During Therapy: WFL for tasks assessed/performed;Anxious                                   General Comments: called son, [name],  to interpret via phone. Patient answered son and followed directions   General Comments  patient was noted to have increased edema in BLE and RUE. patient had son on phone who assisted with translation per patient request.    Exercises     Shoulder Instructions      Home Living Family/patient expects to be discharged to:: Private residence Living Arrangements: Children;Other relatives Available Help at Discharge: Family;Personal care attendant Type of Home: House Home Access: Level entry     Home Layout: Multi-level;Able to live on main level with bedroom/bathroom     Bathroom Shower/Tub: Teacher, early years/pre: Standard     Home Equipment: Environmental consultant - 2 wheels;Walker - 4 wheels;Bedside commode;Grab bars - tub/shower;Tub bench;Wheelchair - manual          Prior Functioning/Environment Level of Independence: Needs assistance  Gait / Transfers Assistance Needed: Pt's son reports pt ambulates with 4WW ADL's / Homemaking Assistance Needed: Pt's son reports aide comes daily 2-3 hrs assist with bathing, dressing, in/out of bed. has difficulty lifting legs. can  toilet herself; son and his spouse assist pt as needed ( as per chart) patients son reported on phone today that patient was independent in all ADLs prior level.            OT Problem List: Decreased strength;Decreased range of motion;Decreased activity tolerance;Impaired balance (sitting and/or standing);Decreased safety awareness;Pain;Impaired UE functional use;Decreased knowledge of use of DME or AE      OT Treatment/Interventions: Self-care/ADL training;Therapeutic exercise;Neuromuscular education;Energy conservation;DME and/or AE instruction;Balance training;Patient/family education;Therapeutic activities    OT Goals(Current goals can be found in the care plan section) Acute Rehab OT Goals Patient Stated Goal: to go home OT Goal Formulation: With patient Time For Goal Achievement: 07/21/21 Potential to Achieve Goals: Good  OT Frequency: Min 2X/week   Barriers to D/C:            Co-evaluation PT/OT/SLP Co-Evaluation/Treatment: Yes Reason for Co-Treatment: To address functional/ADL transfers;For patient/therapist safety PT goals addressed during session: Mobility/safety with mobility OT goals addressed during session: ADL's and self-care      AM-PAC OT "6 Clicks" Daily Activity     Outcome Measure Help from another person eating meals?: A Little Help from another person taking care of personal grooming?: A Little Help  from another person toileting, which includes using toliet, bedpan, or urinal?: Total Help from another person bathing (including washing, rinsing, drying)?: Total Help from another person to put on and taking off regular upper body clothing?: Total Help from another person to put on and taking off regular lower body clothing?: Total 6 Click Score: 10   End of Session    Activity Tolerance: Patient limited by pain Patient left: in bed;with call bell/phone within reach  OT Visit Diagnosis: Muscle weakness (generalized) (M62.81);Pain Pain - Right/Left:  Right Pain - part of body: Shoulder                Time: 4514-6047 OT Time Calculation (min): 21 min Charges:  OT Evaluation $OT Eval High Complexity: 1 High  [name] OTR/L, MS Acute Rehabilitation Department Office# [phone] Pager# [phone]   [place] 07/07/2021, 12:57 PM

## 2021-07-07 NOTE — TOC Progression Note (Addendum)
Transition of Care [facility]) - Progression Note    Patient Details  Name: Katelyn Jackson MRN: [id number] Date of Birth: 1946/02/17  [facility]) CM/SW Contact  Sama Arauz, Juliann Pulse, RN Phone Number: 07/07/2021, 11:59 AM  Clinical Narrative: Son [name] chose [facility]-rep [place] aware. Will need auth within 24-48hrs of d/c,covid test need to be ordered. 12:31p-spoke to [place] rep [place]-bed available next week Tuesday.MD updated.      Expected Discharge Plan: Skilled Nursing Facility Barriers to Discharge: Continued Medical Work up  Expected Discharge Plan and Services Expected Discharge Plan: [place]                                               Social Determinants of Health (SDOH) Interventions    Readmission Risk Interventions Readmission Risk Prevention Plan 07/06/2021 03/19/2021  Transportation Screening Complete Complete  HRI or [place] - Complete  Social Work Consult for [place] Planning/Counseling - Complete  Palliative Care Screening - Not Applicable  Medication Review Press photographer) - Complete  HRI or Home Care Consult Complete -  SW Recovery Care/Counseling Consult Complete -  Palliative Care Screening Not Applicable -  Skilled Nursing Facility Complete -  Some recent data might be hidden

## 2021-07-07 NOTE — Progress Notes (Signed)
MD notified that patient has new increased swelling and redness as well as a firm area on R shoulder. MD to evaluate patient

## 2021-07-07 NOTE — Progress Notes (Signed)
[place] for  warfarin Indication:  hx atrial fibrillation  Allergies  Allergen Reactions   Chlorhexidine Gluconate Hives   Penicillins Itching    Has patient had a PCN reaction causing immediate rash, facial/tongue/throat swelling, SOB or lightheadedness with hypotension: no Has patient had a PCN reaction causing severe rash involving mucus membranes or skin necrosis: No Has patient had a PCN reaction that required hospitalization: No Has patient had a PCN reaction occurring within the last 10 years: No If all of the above answers are "NO", then may proceed with Cephalosporin use.  Tolerated Cephalosporin Date: 03/17/21.    Patient Measurements: Height: 5\' 1"  (154.9 cm) Weight: 114.2 kg (251 lb 12.3 oz) IBW/kg (Calculated) : 47.8 Heparin Dosing Weight:   Vital Signs: Temp: 99.8 F (37.7 C) (08/31 0426) Temp Source: Oral (08/31 0426) BP: 115/58 (08/31 0426) Pulse Rate: 75 (08/31 0426)  Labs: Recent Labs    07/05/21 0430 07/06/21 0515 07/07/21 0451  HGB 9.9* 11.8* 10.6*  HCT 31.2* 36.8 33.5*  PLT 138* 136* 182  LABPROT 74.3* 23.9* 28.7*  INR 9.1* 2.1* 2.7*  CREATININE 1.28* 1.18* 1.35*    Estimated Creatinine Clearance: 42.3 mL/min (A) (by C-G formula based on SCr of 1.35 mg/dL (H)).  Medications:  - on warfarin 2.5mg  daily except 5mg  on Tue PTA (last dose taken on 8/24)  Assessment: Patient is a 75 y.o F with hx afib on warfarin PTA, presented to the ED on 8/25 with failure to thrive, She also reported of a fall one week ago and reports poor oral intake afterwards. Head CT was negative for any acute abnormality.  Pharmacy has been consulted to dose warfarin while she's hospitalized.  Warfarin held 8/27 with elevated INR  Today, 07/07/2021: INR 2.7 after resumption of Warfarin 8/30   Goal of Therapy:  INR 2-3 Monitor platelets by anticoagulation protocol: Yes   Plan:  Warfarin 2mg  today at 1600 Daily INR Monitor  for s/sx bleeding  Minda Ditto PharmD [facility] [phone] 07/07/2021, 8:21 AM

## 2021-07-07 NOTE — Progress Notes (Signed)
PROGRESS NOTE    Katelyn Jackson  SPQ:[id number] DOB: 1946-03-14 DOA: 07/01/2021 PCP: Cassandria Anger, MD    Brief Narrative:  This 75 years old female with PMH significant for permanent A. fib on Coumadin, hypertension, hyperlipidemia, pacemaker in place, history of breast cancer, hypothyroidism, chronic bilateral lymphedema, recurrent falls, morbid obesity, BMI 51, GERD, pulmonary hypertension who needs full assistance for daily life activities, She has home health aide 6 times a week at home,  brought to the ED by her son for the evaluation of recent fall on last Monday.  Patient fell from bed on Monday since then complaining about left shoulder pain,  left ankle pain and bilateral knee pain and unable to walk or stand, go to the bathroom and progressively declining. She was found to have left lower extremity cellulitis.  Patient was started on Rocephin, Patient was given vitamin K due to supratherapeutic INR.  She was also given Lasix for CHF exacerbation.  She was managed with IV antibiotics for cellulitis,  IV Lasix for CHF.  ABG showed CO2 retention-chronic hypercapnia compensated.  Assessment & Plan:   Principal Problem:   Cellulitis of left lower extremity Active Problems:   Essential hypertension   Chronic atrial fibrillation (HCC)   Lymphedema   Anemia of chronic disease   CRF (chronic renal failure), stage 4 (severe) (HCC)   Morbid obesity (HCC)   GERD (gastroesophageal reflux disease)   Hypothyroidism   Supratherapeutic INR   Pulmonary edema   Pressure injury of skin  Cellulitis of left lower extremity /chronic bilateral lymphedema: Leukocytosis has improved.  Continued ceftriaxone for cellulitis. Cellulitis is improving.  Antibiotics changed to Duricef to complete 3 days. Continue adequate pain control.  Recurrent falls / ambulatory dysfunction / moderate to severe osteoarthritis: Patient had complete skeletal survey.  X-ray of the knees and left ankle: No  fracture Right hip: Degenerative disc pain.  Patient has chronic pain for years. Dr. Para March orthopedics was consulted.  Adequate pain control with oral and IV opiates. PT and OT recommended a skilled nursing facility.  Acute hypoxic respiratory failure could be multifactorial secondary to fluid overload,  acute on chronic systolic CHF, hypercapnia likely from OSA. Respiratory status much improved.  Renal functions remained stable and tolerating Lasix. Monitor intake/ output charting.  Daily weight.so far -10.2 L negative balance. Continue supplemental oxygen to keep saturation above 94%.  AKI on CKD stage IIIb: Baseline serum creatinine 1.4-1.6 Creatinine improved with IV Lasix and remained stable.  Essential hypertension: Continue Coreg  Chronic atrial fibrillation: Heart rate remains controlled, continue Coumadin. Coumadin was on hold because of supratherapeutic INR.   Patient was given vitamin K.  Today INR 2.7  Pacemaker in place: Stable.  Hypothyroidism: Continue levothyroxine.  Anemia of chronic disease: Hemoglobin remained stable, no obvious bleeding.  History of breast cancer: Outpatient follow-up.  Right shoulder pain and swelling: Obtain venous duplex to rule out DVT, X-ray right shoulder to rule out fracture.  DVT prophylaxis: Coumadin Code Status: Full code. Family Communication: No family at bed side. Disposition Plan:   Status is: Inpatient  Remains inpatient appropriate because:Inpatient level of care appropriate due to severity of illness  Dispo: The patient is from: Home              Anticipated d/c is to: SNF              Patient currently is not medically stable to d/c.   Difficult to place patient No  Consultants:  None  Procedures:   Antimicrobials:   Anti-infectives (From admission, onward)    Start     Dose/Rate Route Frequency Ordered Stop   07/05/21 1245  cefadroxil (DURICEF) capsule 500 mg        500 mg Oral 2 times daily 07/05/21  1155 07/08/21 0959   07/03/21 0900  cefTRIAXone (ROCEPHIN) 2 g in sodium chloride 0.9 % 100 mL IVPB  Status:  Discontinued        2 g 200 mL/hr over 30 Minutes Intravenous Every 24 hours 07/02/21 0950 07/05/21 1155   07/02/21 0915  cefTRIAXone (ROCEPHIN) 2 g in sodium chloride 0.9 % 100 mL IVPB        2 g 200 mL/hr over 30 Minutes Intravenous  Once 07/02/21 5809 07/02/21 1033        Subjective: Patient was seen and examined at bedside.  Overnight events noted.  Left leg cellulitis has significantly improved. Patient has bilateral lymphedema, still reports having pain.  Objective: Vitals:   07/06/21 2033 07/07/21 0426 07/07/21 0445 07/07/21 1237  BP: (!) 110/53 (!) 115/58  137/66  Pulse: 68 75  (!) 55  Resp: 18 18  20   Temp: 98.4 F (36.9 C) 99.8 F (37.7 C)  99.7 F (37.6 C)  TempSrc: Oral Oral  Oral  SpO2: 93% 91%  93%  Weight:   114.2 kg   Height:        Intake/Output Summary (Last 24 hours) at 07/07/2021 1537 Last data filed at 07/07/2021 0400 Gross per 24 hour  Intake --  Output 700 ml  Net -700 ml   Filed Weights   07/04/21 0435 07/05/21 0500 07/07/21 0445  Weight: 126.2 kg 121.8 kg 114.2 kg    Examination:  General exam: Appears comfortable, deconditioned, not in any acute distress. Respiratory system: Clear to auscultation bilaterally, respiratory effort normal. Cardiovascular system: S1-S2 heard, irregular rhythm, rate normal, no murmur Gastrointestinal system: Abdomen is soft, nontender, nondistended.  BS + Central nervous system: Alert and oriented x 3. No focal neurological deficits. Extremities: Bilateral lymphedema noted, tenderness+, left lower extremity redness resolved.   Right shoulder tenderness , right arm swelling. Skin: No rashes, lesions or ulcers Psychiatry: Judgement and insight appear normal. Mood & affect appropriate.     Data Reviewed: I have personally reviewed following labs and imaging studies  CBC: Recent Labs  Lab  07/01/21 1142 07/03/21 0541 07/04/21 0527 07/05/21 0430 07/06/21 0515 07/07/21 0451  WBC 13.2* 10.7* 8.2 8.2 7.5 8.7  NEUTROABS 11.7*  --   --   --   --   --   HGB 10.1* 10.4* 10.3* 9.9* 11.8* 10.6*  HCT 32.2* 34.2* 32.1* 31.2* 36.8 33.5*  MCV 94.7 97.4 92.8 94.0 91.5 92.3  PLT 107* 113* 123* 138* 136* 983   Basic Metabolic Panel: Recent Labs  Lab 07/02/21 0949 07/03/21 0541 07/04/21 0527 07/05/21 0430 07/06/21 0515 07/07/21 0451  NA  --  143 139 141 138 142  K  --  3.7 3.3* 3.0* 3.2* 3.7  CL  --  102 98 98 97* 99  CO2  --  31 32 33* 33* 35*  GLUCOSE  --  104* 101* 108* 113* 125*  BUN  --  58* 54* 43* 35* 36*  CREATININE  --  1.79* 1.71* 1.28* 1.18* 1.35*  CALCIUM  --  10.3 9.8 9.6 9.3 9.8  MG 1.7  --   --   --   --   --   PHOS 2.1*  --  3.1  --   --   --    GFR: Estimated Creatinine Clearance: 42.3 mL/min (A) (by C-G formula based on SCr of 1.35 mg/dL (H)). Liver Function Tests: Recent Labs  Lab 07/03/21 0541 07/04/21 0527 07/05/21 0430 07/06/21 0515  AST 13* 27 34 49*  ALT 14 17 20 27   ALKPHOS 140* 230* 235* 317*  BILITOT 3.6* 3.2* 3.1* 3.2*  PROT 7.1 6.7 6.3* 6.3*  ALBUMIN 2.7* 2.4* 2.4* 2.2*   No results for input(s): LIPASE, AMYLASE in the last 168 hours. No results for input(s): AMMONIA in the last 168 hours. Coagulation Profile: Recent Labs  Lab 07/03/21 0541 07/04/21 0527 07/05/21 0430 07/06/21 0515 07/07/21 0451  INR 4.7* 6.7* 9.1* 2.1* 2.7*   Cardiac Enzymes: No results for input(s): CKTOTAL, CKMB, CKMBINDEX, TROPONINI in the last 168 hours. BNP (last 3 results) No results for input(s): PROBNP in the last 8760 hours. HbA1C: No results for input(s): HGBA1C in the last 72 hours. CBG: No results for input(s): GLUCAP in the last 168 hours. Lipid Profile: No results for input(s): CHOL, HDL, LDLCALC, TRIG, CHOLHDL, LDLDIRECT in the last 72 hours. Thyroid Function Tests: No results for input(s): TSH, T4TOTAL, FREET4, T3FREE, THYROIDAB in the  last 72 hours. Anemia Panel: No results for input(s): VITAMINB12, FOLATE, FERRITIN, TIBC, IRON, RETICCTPCT in the last 72 hours. Sepsis Labs: No results for input(s): PROCALCITON, LATICACIDVEN in the last 168 hours.  Recent Results (from the past 240 hour(s))  Urine Culture     Status: Abnormal   Collection Time: 07/01/21  2:39 PM   Specimen: Urine, Clean Catch  Result Value Ref Range Status   Specimen Description   Final    URINE, CLEAN CATCH Performed at [facility], [place] [address], [place], [place] [address]    Special Requests   Final    NONE Performed at [facility], [place] [address], [place], [place] [address]    Culture MULTIPLE SPECIES PRESENT, SUGGEST RECOLLECTION (A)  Final   Report Status 07/02/2021 FINAL  Final  SARS CORONAVIRUS 2 (TAT 6-24 HRS) Nasopharyngeal Nasopharyngeal Swab     Status: None   Collection Time: 07/02/21 11:19 AM   Specimen: Nasopharyngeal Swab  Result Value Ref Range Status   SARS Coronavirus 2 NEGATIVE NEGATIVE Final    Comment: (NOTE) SARS-CoV-2 target nucleic acids are NOT DETECTED.  The SARS-CoV-2 RNA is generally detectable in upper and lower respiratory specimens during the acute phase of infection. Negative results do not preclude SARS-CoV-2 infection, do not rule out co-infections with other pathogens, and should not be used as the sole basis for treatment or other patient management decisions. Negative results must be combined with clinical observations, patient history, and epidemiological information. The expected result is Negative.  Fact Sheet for Patients: SugarRoll.be  Fact Sheet for Healthcare Providers: https://www.woods-mathews.com/  This test is not yet approved or cleared by the [place] FDA and  has been authorized for detection and/or diagnosis of SARS-CoV-2 by FDA under an Emergency Use Authorization (EUA). This EUA will remain  in  effect (meaning this test can be used) for the duration of the COVID-19 declaration under Se ction 564(b)(1) of the Act, 21 U.S.C. section 360bbb-3(b)(1), unless the authorization is terminated or revoked sooner.  Performed at [facility], [place] [address] [place], [place] [address]     Radiology Studies: CT LUMBAR SPINE WO CONTRAST  Result Date: 07/06/2021 CLINICAL DATA:  Osteoarthritis, lumbosacral Spinal stenosis, lumbosacral Low back pain,  progressive neurologic deficit EXAM: CT LUMBAR SPINE WITHOUT CONTRAST TECHNIQUE: Multidetector CT imaging of the lumbar spine was performed without intravenous contrast administration. Multiplanar CT image reconstructions were also generated. COMPARISON:  MRI 06/19/2017, CT 01/05/2021. FINDINGS: Segmentation: There are 4 non-rib-bearing lumbar type vertebrae, for the purposes of this dictation, numbering is done consistent with the prior MRI in 2018 and which the most inferior well-formed intervertebral disc is labeled as L5-S1. Alignment: Trace retrolisthesis at L3-L4. Straightening of the lumbar lordosis. Vertebrae: No evidence of acute fracture. There is marked endplate irregularity on the right at L3-L4 and on the left at L5-S1, similar to prior CT and March 2022, and progressed since the prior lumbar spine MRI in August 2018. This is favored to be degenerative endplate change. Paraspinal and other soft tissues: Negative. Disc levels: T12-L1: No significant spinal canal or neural foraminal narrowing. L1-L2: No significant spinal canal or neural foraminal narrowing. L2-L3: Arthropathy mild broad-based disc bulging and bilateral facet arthropathy results in mild spinal canal stenosis. No significant neural foraminal narrowing. L3-L4: Trace retrolisthesis with broad-based disc bulging and bilateral facet arthropathy, marked right-sided endplate irregularity. Moderate spinal canal stenosis. Mild bilateral neural foraminal narrowing. L4-L5: Severe disc height  loss with posterior disc osteophyte complex and bilateral facet arthropathy results and moderate spinal canal stenosis and mild-to-moderate bilateral neural foraminal narrowing. L5-S1: Disc height loss with endplate irregularity on the left, bilateral facet arthropathy and asymmetric left disc bulging results and moderate bilateral neural foraminal narrowing. IMPRESSION: Multilevel degenerative changes of the lumbar spine, worst at L3-L4, L4-5, and L5-S1, as summarized below: L3-L4: Moderate spinal canal stenosis. Mild bilateral neural foraminal narrowing. Trace retrolisthesis and marked right-sided endplate irregularity. L4-L5: Moderate spinal canal stenosis. Mild-to-moderate bilateral neural foraminal narrowing. Severe disc height loss. L5-S1: Moderate bilateral neural foraminal narrowing. Electronically Signed   By: Maurine Simmering M.D.   On: 07/06/2021 15:30   CT PELVIS WO CONTRAST  Result Date: 07/06/2021 CLINICAL DATA:  Low back and bilateral lower extremity pain. EXAM: CT PELVIS WITHOUT CONTRAST TECHNIQUE: Multidetector CT imaging of the pelvis was performed following the standard protocol without intravenous contrast. COMPARISON:  None. FINDINGS: Urinary Tract:  No abnormality visualized. Bowel:  Unremarkable visualized pelvic bowel loops. Vascular/Lymphatic: No pathologically enlarged lymph nodes. No significant vascular abnormality seen. Reproductive: Small calcified uterine fibroid is noted. No adnexal abnormalities noted. Other:  None. Musculoskeletal: Severe multilevel degenerative disc disease is noted in the visualized lumbar spine. No acute osseous abnormality is noted. No fracture is noted. Hip and sacroiliac joints are unremarkable. IMPRESSION: Severe multilevel degenerative disc disease is noted in the lower lumbar spine. No fracture or other acute osseous abnormalities noted. Small calcified uterine fibroid. Electronically Signed   By: Marijo Conception M.D.   On: 07/06/2021 14:45   CT KNEE LEFT  WO CONTRAST  Result Date: 07/06/2021 CLINICAL DATA:  Severe lymphedema EXAM: CT OF THE left KNEE WITHOUT CONTRAST TECHNIQUE: Multidetector CT imaging of the left knee was performed according to the standard protocol. Multiplanar CT image reconstructions were also generated. COMPARISON:  Knee radiograph 07/01/2021 FINDINGS: Bones/Joint/Cartilage There is no evidence of acute fracture. There is tricompartment osteophyte formation with subchondral sclerosis and cystic change, with severe medial and patellofemoral joint space narrowing. Slight varus alignment. Small joint effusion. 4 mm ossified joint body is present within the suprapatellar joint space (series 1, image 48). Ligaments Suboptimally assessed by CT. Muscles and Tendons Mild generalized muscle atrophy. Soft tissues There is skin thickening and extensive subcutaneous soft tissue swelling.  There is no well-defined or focal fluid collection. There is a 2.1 x 1.5 cm partially visualized soft tissue density lesion in the posteromedial thigh (series 1, image 1). IMPRESSION: No evidence of acute fracture. Tricompartment osteoarthritis, severe in the medial and patellofemoral compartments. Small joint effusion with 4 mm ossified joint body within the suprapatellar joint space. Diffuse skin thickening and subcutaneous soft tissue swelling as can be seen in cellulitis or lymphedema. Partially visualized 2.1 x 1.5 cm soft tissue lesion in the posteromedial thigh subcutaneous tissues adjacent to a superficial vein. This could potentially represent a lymph node, however recommend targeted ultrasound for further evaluation. Electronically Signed   By: Maurine Simmering M.D.   On: 07/06/2021 15:36   CT KNEE RIGHT WO CONTRAST  Result Date: 07/06/2021 CLINICAL DATA:  Lymphedema EXAM: CT OF THE right KNEE WITHOUT CONTRAST TECHNIQUE: Multidetector CT imaging of the right knee was performed according to the standard protocol. Multiplanar CT image reconstructions were also  generated. COMPARISON:  None. FINDINGS: Bones/Joint/Cartilage There is no evidence of acute fracture. There is tricompartment osteophyte formation with subchondral cystic change and sclerosis. There is severe joint space narrowing. There is a moderate-sized joint effusion. There is flattening of the medial tibial plateau. There is a osteochondral defect along the inner posterior weight-bearing lateral femoral condyle measuring 9 mm in width (series 6, image 73). Ligaments Suboptimally assessed by CT. Muscles and Tendons Mild generalized muscle atrophy. Soft tissues There is diffuse skin thickening and subcutaneous edema. No focal fluid collection. IMPRESSION: No evidence of acute fracture. Severe tricompartment osteoarthritis of the right knee, with osteochondral defect along the posterior weight-bearing lateral femoral condyle and flattening of the medial tibial plateau. Moderate-sized joint effusion. Diffuse skin thickening and subcutaneous soft tissue swelling, as can be seen with cellulitis or lymphedema. No focal fluid collection. Electronically Signed   By: Maurine Simmering M.D.   On: 07/06/2021 15:40   VAS [place] UPPER EXTREMITY VENOUS DUPLEX  Result Date: 07/07/2021 UPPER VENOUS STUDY  Patient Name:  Katelyn Jackson  Date of Exam:   07/07/2021 Medical Rec #: [id number]          Accession #:    [id number] Date of Birth: 1945-12-16          Patient Gender: F Patient Age:   36 years Exam Location:  [facility] Procedure:      VAS [place] UPPER EXTREMITY VENOUS DUPLEX Referring Phys: Shawna Clamp --------------------------------------------------------------------------------  Indications: Edema, and Erythema Limitations: Body habitus, poor ultrasound/tissue interface and patient position. Comparison Study: No prior study Performing Technologist: Maudry Mayhew MHA, RDMS, RVT, RDCS  Examination Guidelines: A complete evaluation includes B-mode imaging, spectral Doppler, color Doppler, and power Doppler as  needed of all accessible portions of each vessel. Bilateral testing is considered an integral part of a complete examination. Limited examinations for reoccurring indications may be performed as noted.  Right Findings: +----------+------------+---------+-----------+----------+--------------+ RIGHT     CompressiblePhasicitySpontaneousProperties   Summary     +----------+------------+---------+-----------+----------+--------------+ IJV           Full       Yes       Yes                             +----------+------------+---------+-----------+----------+--------------+ Subclavian    Full       Yes       Yes                             +----------+------------+---------+-----------+----------+--------------+  Axillary      Full       Yes       Yes                             +----------+------------+---------+-----------+----------+--------------+ Brachial      Full       Yes       Yes                             +----------+------------+---------+-----------+----------+--------------+ Radial        Full                                                 +----------+------------+---------+-----------+----------+--------------+ Ulnar                                               Not visualized +----------+------------+---------+-----------+----------+--------------+ Cephalic      Full                                                 +----------+------------+---------+-----------+----------+--------------+ Basilic       Full                                                 +----------+------------+---------+-----------+----------+--------------+  Summary:  Right: No evidence of deep vein thrombosis in the upper extremity. However, unable to visualize the ulnar veins.  *See table(s) above for measurements and observations.    Preliminary     Scheduled Meds:  carvedilol  25 mg Oral BID   cefadroxil  500 mg Oral BID   cholecalciferol  2,000 Units Oral Daily    exemestane  25 mg Oral QPC breakfast   furosemide  40 mg Intravenous BID   levothyroxine  50 mcg Oral QAC breakfast   magnesium oxide  400 mg Oral BID   pantoprazole  40 mg Oral Daily   potassium chloride  20 mEq Oral BID   pravastatin  20 mg Oral q1800   vitamin B-12  2,000 mcg Oral Daily   warfarin  2 mg Oral ONCE-1600   Warfarin - Pharmacist Dosing Inpatient   Does not apply q1600   Continuous Infusions:  sodium chloride 250 mL (07/04/21 0822)     LOS: 5 days    Time spent: 35 mins    Jenilyn Magana, MD [facility]   If 7PM-7AM, please contact night-coverage

## 2021-07-07 NOTE — Progress Notes (Signed)
Physical Therapy Treatment Patient Details Name: Katelyn Jackson MRN: [id number] DOB: 09-Jun-1946 Today's Date: 07/07/2021    History of Present Illness Katelyn Jackson is a 75 y.o. female brought by son to the ER for further evaluation of recent fall. Patient's son at the bedside is the historian:.  He tells me that patient fell from the bed on Monday and since then she is complaining of left shoulder pain, left ankle pain and bilateral knee pain, she is unable to walk, stand, go to bathroom and progressively declining. PMH: permanent A. fib, HTN, hyperlipidemia, s/p pacemaker placement, breast cancer, hypothyroidism, chronic bilateral lymphedema, recurrent falls, obesity, GERD, pulmonary hypertension    PT Comments    Patient's son [name] called on phone to interpret for patient, no Ipad available. Patient required +3 total to mobilize to sitting, supporting legs and trunk. Once in sitting, patient able to maintain static balance. Sat x ~ 5 minutes. + 3 to return to supine, assisting legs and trunk. Patient moans when moved, indicates right shoulder and  both legs are painful.  R anterior shoulder  at site of TSA surgery noted to be reddened . OT informed RN.  Continue PT for mobility.  Follow Up Recommendations  SNF     Equipment Recommendations  None recommended by PT    Recommendations for Other Services       Precautions / Restrictions Precautions Precautions: Fall Precaution Comments: leg pain, right shoulder TSA now painful-negative for fx/dislocation Restrictions Weight Bearing Restrictions: No    Mobility  Bed Mobility   Bed Mobility: Supine to Sit     Supine to sit: Total assist;HOB elevated (+3)     General bed mobility comments: HOB raised, +3 to  move legs and raise trunk and slide to bed edge using bed pads. +3 to lift legs and return to supine due to body habitus    Transfers                 General transfer comment: unable  Ambulation/Gait                  Stairs             Wheelchair Mobility    Modified Rankin (Stroke Patients Only)       Balance Overall balance assessment: Needs assistance;History of Falls Sitting-balance support: Bilateral upper extremity supported;Feet unsupported Sitting balance-Leahy Scale: Poor Sitting balance - Comments: once  moved to bed edge, able to maintain static sitting, drank from cup w/ straw.                                    Cognition Arousal/Alertness: Awake/alert Behavior During Therapy: WFL for tasks assessed/performed;Anxious                                   General Comments: called son, [name],  to interpret via phone. Patient answered son and followed directions      Exercises      General Comments        Pertinent Vitals/Pain Pain Location: bil knees, L ankle and R shoulder Pain Descriptors / Indicators: Grimacing;Moaning;Guarding Pain Intervention(s): Monitored during session    Home Living                      Prior Function  PT Goals (current goals can now be found in the care plan section) Progress towards PT goals: Progressing toward goals    Frequency    Min 2X/week      PT Plan Current plan remains appropriate    Co-evaluation PT/OT/SLP Co-Evaluation/Treatment: Yes Reason for Co-Treatment: For patient/therapist safety PT goals addressed during session: Mobility/safety with mobility OT goals addressed during session: ADL's and self-care      AM-PAC PT "6 Clicks" Mobility   Outcome Measure  Help needed turning from your back to your side while in a flat bed without using bedrails?: Total Help needed moving from lying on your back to sitting on the side of a flat bed without using bedrails?: Total Help needed moving to and from a bed to a chair (including a wheelchair)?: Total Help needed standing up from a chair using your arms (e.g., wheelchair or bedside chair)?:  Total Help needed to walk in hospital room?: Total Help needed climbing 3-5 steps with a railing? : Total 6 Click Score: 6    End of Session   Activity Tolerance: Patient tolerated treatment well;Patient limited by fatigue Patient left: in bed;with call bell/phone within reach;with nursing/sitter in room Nurse Communication: Mobility status;Need for lift equipment PT Visit Diagnosis: Other abnormalities of gait and mobility (R26.89);Muscle weakness (generalized) (M62.81)     Time: 1216-2446 PT Time Calculation (min) (ACUTE ONLY): 20 min  Charges:  $Therapeutic Activity: 8-22 mins                     Tresa Endo PT Acute Rehabilitation Services Pager [phone] Office [phone]    Claretha Cooper 07/07/2021, 11:57 AM

## 2021-07-08 ENCOUNTER — Inpatient Hospital Stay (HOSPITAL_COMMUNITY): Payer: Medicare HMO

## 2021-07-08 DIAGNOSIS — L03116 Cellulitis of left lower limb: Secondary | ICD-10-CM | POA: Diagnosis not present

## 2021-07-08 LAB — PROTIME-INR
INR: 3.5 — ABNORMAL HIGH (ref 0.8–1.2)
Prothrombin Time: 35.4 seconds — ABNORMAL HIGH (ref 11.4–15.2)

## 2021-07-08 LAB — CBC
HCT: 30.9 % — ABNORMAL LOW (ref 36.0–46.0)
Hemoglobin: 10 g/dL — ABNORMAL LOW (ref 12.0–15.0)
MCH: 29.8 pg (ref 26.0–34.0)
MCHC: 32.4 g/dL (ref 30.0–36.0)
MCV: 92 fL (ref 80.0–100.0)
Platelets: 187 10*3/uL (ref 150–400)
RBC: 3.36 MIL/uL — ABNORMAL LOW (ref 3.87–5.11)
RDW: 16.7 % — ABNORMAL HIGH (ref 11.5–15.5)
WBC: 7.2 10*3/uL (ref 4.0–10.5)
nRBC: 0 % (ref 0.0–0.2)

## 2021-07-08 LAB — BASIC METABOLIC PANEL
Anion gap: 6 (ref 5–15)
BUN: 36 mg/dL — ABNORMAL HIGH (ref 8–23)
CO2: 35 mmol/L — ABNORMAL HIGH (ref 22–32)
Calcium: 9.7 mg/dL (ref 8.9–10.3)
Chloride: 97 mmol/L — ABNORMAL LOW (ref 98–111)
Creatinine, Ser: 1.27 mg/dL — ABNORMAL HIGH (ref 0.44–1.00)
GFR, Estimated: 44 mL/min — ABNORMAL LOW (ref >60)
Glucose, Bld: 111 mg/dL — ABNORMAL HIGH (ref 70–99)
Potassium: 4 mmol/L (ref 3.5–5.1)
Sodium: 138 mmol/L (ref 135–145)

## 2021-07-08 LAB — PHOSPHORUS: Phosphorus: 2.8 mg/dL (ref 2.5–4.6)

## 2021-07-08 LAB — MAGNESIUM: Magnesium: 2.2 mg/dL (ref 1.7–2.4)

## 2021-07-08 NOTE — Progress Notes (Signed)
PROGRESS NOTE    Katelyn Jackson  XTK:[id number] DOB: Mar 06, 1946 DOA: 07/01/2021 PCP: Cassandria Anger, MD    Brief Narrative:  This 75 years old female with PMH significant for permanent A. fib on Coumadin, hypertension, hyperlipidemia, pacemaker in place, history of breast cancer, hypothyroidism, chronic bilateral lymphedema, recurrent falls, morbid obesity, BMI 51, GERD, pulmonary hypertension who needs full assistance for daily life activities, She has home health aide 6 times a week at home,  brought to the ED by her son for the evaluation of recent fall on last Monday.  Patient fell from bed on Monday since then complaining about left shoulder pain,  left ankle pain and bilateral knee pain and unable to walk or stand, go to the bathroom and progressively declining. She was found to have left lower extremity cellulitis.  Patient was started on Rocephin, Patient was given vitamin K due to supratherapeutic INR.  She was also given Lasix for CHF exacerbation.  She was managed with IV antibiotics for cellulitis,  IV Lasix for CHF.  ABG showed CO2 retention-chronic hypercapnia compensated.  Assessment & Plan:   Principal Problem:   Cellulitis of left lower extremity Active Problems:   Essential hypertension   Chronic atrial fibrillation (HCC)   Lymphedema   Anemia of chronic disease   CRF (chronic renal failure), stage 4 (severe) (HCC)   Morbid obesity (HCC)   GERD (gastroesophageal reflux disease)   Hypothyroidism   Supratherapeutic INR   Pulmonary edema   Pressure injury of skin  Cellulitis of left lower extremity /chronic bilateral lymphedema: Leukocytosis has improved.  Continued ceftriaxone for cellulitis. Cellulitis has resolved.  Completed course of antibiotics x 3 days.   Continue adequate pain control.  Recurrent falls / ambulatory dysfunction / moderate to severe osteoarthritis: Patient had complete skeletal survey.  X-ray of the knees and left ankle: No  fracture Right hip: Degenerative disc pain.  Patient has chronic pain for years. Dr. Para March orthopedics was consulted.  Adequate pain control with oral and IV opiates. Despite the need for bilateral total knee replacements,  she is certainly not a surgical candidate from cardiac renal and body habitus standpoint. PT and OT recommended  skilled nursing facility.  Acute hypoxic respiratory failure could be multifactorial secondary to fluid overload,  acute on chronic systolic CHF, hypercapnia likely from OSA. Respiratory status much improved.  Renal functions remained stable and tolerating Lasix. Monitor intake/ output charting.  Daily weight.so far -10.2 L negative balance. Continue supplemental oxygen to keep saturation above 94%.  AKI on CKD stage IIIb: Baseline serum creatinine 1.4-1.6 Creatinine improved with IV Lasix and remained stable.  Essential hypertension: Continue Coreg  Chronic atrial fibrillation: Heart rate remains controlled, continue Coumadin. Coumadin was on hold because of supratherapeutic INR.   Patient was given vitamin K.  Today INR 3.5 Hold coumadin tonight  Pacemaker in place: Stable.  Hypothyroidism: Continue levothyroxine.  Anemia of chronic disease: Hemoglobin remained stable, no obvious bleeding.  History of breast cancer: Outpatient follow-up.  Right shoulder pain and swelling: Venous duplex ruled out DVT. Right shoulder x-ray and CT scan showed anterior shoulder dislocation. Ortho is following.  DVT prophylaxis: Coumadin Code Status: Full code. Family Communication: No family at bed side. Disposition Plan:   Status is: Inpatient  Remains inpatient appropriate because:Inpatient level of care appropriate due to severity of illness  Dispo: The patient is from: Home              Anticipated d/c is to: SNF  Patient currently is not medically stable to d/c.   Difficult to place patient No  Consultants:  None  Procedures:    Antimicrobials:   Anti-infectives (From admission, onward)    Start     Dose/Rate Route Frequency Ordered Stop   07/05/21 1245  cefadroxil (DURICEF) capsule 500 mg        500 mg Oral 2 times daily 07/05/21 1155 07/07/21 2217   07/03/21 0900  cefTRIAXone (ROCEPHIN) 2 g in sodium chloride 0.9 % 100 mL IVPB  Status:  Discontinued        2 g 200 mL/hr over 30 Minutes Intravenous Every 24 hours 07/02/21 0950 07/05/21 1155   07/02/21 0915  cefTRIAXone (ROCEPHIN) 2 g in sodium chloride 0.9 % 100 mL IVPB        2 g 200 mL/hr over 30 Minutes Intravenous  Once 07/02/21 4259 07/02/21 1033        Subjective: Patient was seen and examined at bedside.  Overnight events noted.   Left leg cellulitis is significantly improved.  Patient has bilateral lymphedema,,  still reports having chronic pain.    Objective: Vitals:   07/08/21 0500 07/08/21 0500 07/08/21 0820 07/08/21 1202  BP:  (!) 119/56 130/65 121/73  Pulse:  (!) 59 67 65  Resp:  16 16   Temp:  97.9 F (36.6 C) 98.3 F (36.8 C) 99.1 F (37.3 C)  TempSrc:  Oral Oral Oral  SpO2:  91% 95% 95%  Weight: 119.5 kg     Height:        Intake/Output Summary (Last 24 hours) at 07/08/2021 1442 Last data filed at 07/08/2021 0900 Gross per 24 hour  Intake 120 ml  Output 1100 ml  Net -980 ml   Filed Weights   07/05/21 0500 07/07/21 0445 07/08/21 0500  Weight: 121.8 kg 114.2 kg 119.5 kg    Examination:  General exam: Appears comfortable, deconditioned, not in any acute distress. Respiratory system: Clear to auscultation bilaterally, respiratory effort normal. Cardiovascular system: S1-S2 heard, irregular rhythm, rate normal, no murmur Gastrointestinal system: Abdomen is soft, nontender, nondistended.  BS + Central nervous system: Alert and oriented x 3. No focal neurological deficits. Extremities: Bilateral lymphedema noted, tenderness+, left lower extremity redness resolved.   Right shoulder tenderness , right arm swelling. Skin: No  rashes, lesions or ulcers Psychiatry: Judgement and insight appear normal. Mood & affect appropriate.     Data Reviewed: I have personally reviewed following labs and imaging studies  CBC: Recent Labs  Lab 07/04/21 0527 07/05/21 0430 07/06/21 0515 07/07/21 0451 07/08/21 0800  WBC 8.2 8.2 7.5 8.7 7.2  HGB 10.3* 9.9* 11.8* 10.6* 10.0*  HCT 32.1* 31.2* 36.8 33.5* 30.9*  MCV 92.8 94.0 91.5 92.3 92.0  PLT 123* 138* 136* 182 563   Basic Metabolic Panel: Recent Labs  Lab 07/02/21 0949 07/03/21 0541 07/04/21 0527 07/05/21 0430 07/06/21 0515 07/07/21 0451 07/08/21 0800  NA  --    < > 139 141 138 142 138  K  --    < > 3.3* 3.0* 3.2* 3.7 4.0  CL  --    < > 98 98 97* 99 97*  CO2  --    < > 32 33* 33* 35* 35*  GLUCOSE  --    < > 101* 108* 113* 125* 111*  BUN  --    < > 54* 43* 35* 36* 36*  CREATININE  --    < > 1.71* 1.28* 1.18* 1.35* 1.27*  CALCIUM  --    < >  9.8 9.6 9.3 9.8 9.7  MG 1.7  --   --   --   --   --  2.2  PHOS 2.1*  --  3.1  --   --   --  2.8   < > = values in this interval not displayed.   GFR: Estimated Creatinine Clearance: 46.2 mL/min (A) (by C-G formula based on SCr of 1.27 mg/dL (H)). Liver Function Tests: Recent Labs  Lab 07/03/21 0541 07/04/21 0527 07/05/21 0430 07/06/21 0515  AST 13* 27 34 49*  ALT 14 17 20 27   ALKPHOS 140* 230* 235* 317*  BILITOT 3.6* 3.2* 3.1* 3.2*  PROT 7.1 6.7 6.3* 6.3*  ALBUMIN 2.7* 2.4* 2.4* 2.2*   No results for input(s): LIPASE, AMYLASE in the last 168 hours. No results for input(s): AMMONIA in the last 168 hours. Coagulation Profile: Recent Labs  Lab 07/04/21 0527 07/05/21 0430 07/06/21 0515 07/07/21 0451 07/08/21 0800  INR 6.7* 9.1* 2.1* 2.7* 3.5*   Cardiac Enzymes: No results for input(s): CKTOTAL, CKMB, CKMBINDEX, TROPONINI in the last 168 hours. BNP (last 3 results) No results for input(s): PROBNP in the last 8760 hours. HbA1C: No results for input(s): HGBA1C in the last 72 hours. CBG: No results for  input(s): GLUCAP in the last 168 hours. Lipid Profile: No results for input(s): CHOL, HDL, LDLCALC, TRIG, CHOLHDL, LDLDIRECT in the last 72 hours. Thyroid Function Tests: No results for input(s): TSH, T4TOTAL, FREET4, T3FREE, THYROIDAB in the last 72 hours. Anemia Panel: No results for input(s): VITAMINB12, FOLATE, FERRITIN, TIBC, IRON, RETICCTPCT in the last 72 hours. Sepsis Labs: No results for input(s): PROCALCITON, LATICACIDVEN in the last 168 hours.  Recent Results (from the past 240 hour(s))  Urine Culture     Status: Abnormal   Collection Time: 07/01/21  2:39 PM   Specimen: Urine, Clean Catch  Result Value Ref Range Status   Specimen Description   Final    URINE, CLEAN CATCH Performed at [facility], [place] [address] [place], [place] [address]    Special Requests   Final    NONE Performed at [facility], [employer] [employer] [address], [place], [place] [address]    Culture MULTIPLE SPECIES PRESENT, SUGGEST RECOLLECTION (A)  Final   Report Status 07/02/2021 FINAL  Final  SARS CORONAVIRUS 2 (TAT 6-24 HRS) Nasopharyngeal Nasopharyngeal Swab     Status: None   Collection Time: 07/02/21 11:19 AM   Specimen: Nasopharyngeal Swab  Result Value Ref Range Status   SARS Coronavirus 2 NEGATIVE NEGATIVE Final    Comment: (NOTE) SARS-CoV-2 target nucleic acids are NOT DETECTED.  The SARS-CoV-2 RNA is generally detectable in upper and lower respiratory specimens during the acute phase of infection. Negative results do not preclude SARS-CoV-2 infection, do not rule out co-infections with other pathogens, and should not be used as the sole basis for treatment or other patient management decisions. Negative results must be combined with clinical observations, patient history, and epidemiological information. The expected result is Negative.  Fact Sheet for Patients: SugarRoll.be  Fact Sheet for Healthcare  Providers: https://www.woods-mathews.com/  This test is not yet approved or cleared by the [place] FDA and  has been authorized for detection and/or diagnosis of SARS-CoV-2 by FDA under an Emergency Use Authorization (EUA). This EUA will remain  in effect (meaning this test can be used) for the duration of the COVID-19 declaration under Se ction 564(b)(1) of the Act, 21 U.S.C. section 360bbb-3(b)(1), unless the authorization is terminated or  revoked sooner.  Performed at [facility], [place] [address] [place], [place] [address]     Radiology Studies: DG Shoulder Right  Result Date: 07/07/2021 CLINICAL DATA:  Fall several days ago with persistent shoulder pain EXAM: RIGHT SHOULDER - 2+ VIEW COMPARISON:  06/28/2021 FINDINGS: Right shoulder arthroplasty is noted. Findings suggestive of anterior dislocation of the humeral component from the glenoid is noted. No definitive fracture is seen. Degenerative changes about the acromioclavicular joint are seen. Underlying bony thorax appears within normal limits. IMPRESSION: Changes highly suggestive of anterior dislocation of the humeral prosthesis. CT may be helpful for further evaluation. Electronically Signed   By: Inez Catalina M.D.   On: 07/07/2021 16:39   CT SHOULDER RIGHT WO CONTRAST  Result Date: 07/08/2021 CLINICAL DATA:  Shoulder pain, traumatic (Ped 0-18y) anterior shoulder dislocation EXAM: CT OF THE UPPER RIGHT EXTREMITY WITHOUT CONTRAST TECHNIQUE: Multidetector CT imaging of the upper right extremity was performed according to the standard protocol. COMPARISON:  Right shoulder radiograph 07/07/2021, chest radiograph 07/01/2021, shoulder radiograph 06/28/2021, CT 02/17/2021 FINDINGS: Bones/Joint/Cartilage Unchanged acromion fracture and thinning of the undersurface of the acromion and distal clavicle. Anatomic right shoulder arthroplasty, which appears anterior inferiorly subluxed. There is glenoid bone loss with  retroversion. There is a joint effusion, and subacromial-subdeltoid bursal distension. Ligaments Suboptimally assessed by CT. Muscles and Tendons Severe supraspinatus and infraspinatus muscle atrophy. Moderate subscapularis muscle atrophy. Soft tissues There is a fluid collection containing gas along the lateral proximal humerus measuring approximately 7.7 x 2.7 cm. This may connect with the bursa/glenohumeral joint. IMPRESSION: Anterior inferior subluxation of the anatomic shoulder arthroplasty with some loss of glenoid bone stock. Glenohumeral joint effusion and subacromial-subdeltoid bursal distension, with measurable fluid collection containing gas along the lateral aspect of the proximal humerus measuring 7.7 x 2.7 cm. This collection may connect with the bursa/joint. Unchanged acromion fracture and chronic thinning of the acromion and distal clavicle. Severe supraspinatus and infraspinatus muscle atrophy. Moderate subscapularis muscle atrophy. Electronically Signed   By: Maurine Simmering M.D.   On: 07/08/2021 14:24   VAS [place] UPPER EXTREMITY VENOUS DUPLEX  Result Date: 07/07/2021 UPPER VENOUS STUDY  Patient Name:  Katelyn Jackson  Date of Exam:   07/07/2021 Medical Rec #: [id number]          Accession #:    [id number] Date of Birth: 29-Oct-1946          Patient Gender: F Patient Age:   79 years Exam Location:  [facility] Procedure:      VAS [place] UPPER EXTREMITY VENOUS DUPLEX Referring Phys: Shawna Clamp --------------------------------------------------------------------------------  Indications: Edema, and Erythema Limitations: Body habitus, poor ultrasound/tissue interface and patient position. Comparison Study: No prior study Performing Technologist: Maudry Mayhew MHA, RDMS, RVT, RDCS  Examination Guidelines: A complete evaluation includes B-mode imaging, spectral Doppler, color Doppler, and power Doppler as needed of all accessible portions of each vessel. Bilateral testing is considered an  integral part of a complete examination. Limited examinations for reoccurring indications may be performed as noted.  Right Findings: +----------+------------+---------+-----------+----------+--------------+ RIGHT     CompressiblePhasicitySpontaneousProperties   Summary     +----------+------------+---------+-----------+----------+--------------+ IJV           Full       Yes       Yes                             +----------+------------+---------+-----------+----------+--------------+ Subclavian    Full  Yes       Yes                             +----------+------------+---------+-----------+----------+--------------+ Axillary      Full       Yes       Yes                             +----------+------------+---------+-----------+----------+--------------+ Brachial      Full       Yes       Yes                             +----------+------------+---------+-----------+----------+--------------+ Radial        Full                                                 +----------+------------+---------+-----------+----------+--------------+ Ulnar                                               Not visualized +----------+------------+---------+-----------+----------+--------------+ Cephalic      Full                                                 +----------+------------+---------+-----------+----------+--------------+ Basilic       Full                                                 +----------+------------+---------+-----------+----------+--------------+  Summary:  Right: No evidence of deep vein thrombosis in the upper extremity. However, unable to visualize the ulnar veins.  *See table(s) above for measurements and observations.  Diagnosing physician: Harold Barban MD Electronically signed by Harold Barban MD on 07/07/2021 at 6:32:26 PM.    Final     Scheduled Meds:  carvedilol  25 mg Oral BID   cholecalciferol  2,000 Units Oral Daily   exemestane   25 mg Oral QPC breakfast   furosemide  40 mg Intravenous BID   levothyroxine  50 mcg Oral QAC breakfast   magnesium oxide  400 mg Oral BID   pantoprazole  40 mg Oral Daily   potassium chloride  20 mEq Oral BID   pravastatin  20 mg Oral q1800   vitamin B-12  2,000 mcg Oral Daily   Warfarin - Pharmacist Dosing Inpatient   Does not apply q1600   Continuous Infusions:  sodium chloride 250 mL (07/04/21 0822)     LOS: 6 days    Time spent: 25 mins    Mayer Vondrak, MD [facility]   If 7PM-7AM, please contact night-coverage

## 2021-07-08 NOTE — Consult Note (Signed)
[facility] Inpatient Consult   07/08/2021  Katelyn Jackson [birth year] [id number]  [name]  Accountable Care Organization [ACO] Patient: [facility] Medicare   Patient chart reviewed due to high risk score for unplanned readmission. Patient was screened for [facility] services. This patient is also in an [facility] which has a chronic disease management Embedded Care Management team.  According to encounter review, patient active with chronic care management services at [facility]. Per review, current recommendation is for skilled nursing facility.   Plan: Will continue to follow for progression.  Netta Cedars, MSN, [facility] Liaison Nurse Mobile Phone [phone]  Toll free office [phone]

## 2021-07-08 NOTE — Progress Notes (Signed)
This 75 years old female with PMH significant for permanent A. fib on Coumadin, hypertension, hyperlipidemia, pacemaker in place, history of breast cancer, hypothyroidism, chronic bilateral lymphedema, recurrent falls, morbid obesity, BMI 51, GERD, pulmonary hypertension who needs full assistance for daily life activities, She has home health aide 6 times a week at home,  brought to the ED by her son for the evaluation of recent fall on last Monday.  Patient fell from bed on Monday since then complaining about left shoulder pain,  left ankle pain and bilateral knee pain and unable to walk or stand, go to the bathroom and progressively declining. She was found to have left lower extremity cellulitis.  Patient was started on Rocephin, Patient was given vitamin K due to supratherapeutic INR.  She was also given Lasix for CHF exacerbation.  She was managed with IV antibiotics for cellulitis,  IV Lasix for CHF.  ABG showed CO2 retention-chronic hypercapnia compensated.    Objective: Vital signs in last 24 hours: Temp:  [97.9 F (36.6 C)-99.7 F (37.6 C)] 98.3 F (36.8 C) (09/01 0820) Pulse Rate:  [55-67] 67 (09/01 0820) Resp:  [16-20] 16 (09/01 0820) BP: (113-137)/(53-66) 130/65 (09/01 0820) SpO2:  [87 %-95 %] 95 % (09/01 0820) Weight:  [119.5 kg] 119.5 kg (09/01 0500)  Intake/Output from previous day: 08/31 0701 - 09/01 0700 In: -  Out: 1100 [Urine:1100] Intake/Output this shift: No intake/output data recorded.  Recent Labs    07/06/21 0515 07/07/21 0451 07/08/21 0800  HGB 11.8* 10.6* 10.0*   Recent Labs    07/07/21 0451 07/08/21 0800  WBC 8.7 7.2  RBC 3.63* 3.36*  HCT 33.5* 30.9*  PLT 182 187   Recent Labs    07/06/21 0515 07/07/21 0451  NA 138 142  K 3.2* 3.7  CL 97* 99  CO2 33* 35*  BUN 35* 36*  CREATININE 1.18* 1.35*  GLUCOSE 113* 125*  CALCIUM 9.3 9.8   Recent Labs    07/07/21 0451 07/08/21 0800  INR 2.7* 3.5*   Ct Scan of her lumber spine shows severe  spinal stenosis and diffuse degenerative disc disease which is chronic and progressive but no acute changes since March of 2022 that could account for sudden change in ambulatory status.  CT pelvis negative  CT both knees show severe OA bilaterally without fracture.  Bilateral lower extremity show severely obese legs with bilateral lymphedema.  Both legs and feet are warm but unable to palpate DP pulses due to significant swelling and hypersensitivity    Assessment Principal Problem:   Cellulitis of left lower extremity Active Problems:   Essential hypertension   Chronic atrial fibrillation (HCC)   Lymphedema   Anemia of chronic disease   CRF (chronic renal failure), stage 4 (severe) (HCC)   Morbid obesity (HCC)   GERD (gastroesophageal reflux disease)   Hypothyroidism   Supratherapeutic INR   Pulmonary edema   Pressure injury of skin   Plan: Nothing further from orthopedic surgery.  Despite the need for bilateral total knee replacements she is certainly not a surgical candidate from a cardiac, renal or body habitus standpoint.  Would benefit from skilled PT and OT for mobility purposes at a SNF as she could not feed herself or mobilize herself within the bed much less transferring out of bed.    Orthopedic surgery signing off.    Linda Hedges 07/08/2021, 8:49 AM

## 2021-07-08 NOTE — Progress Notes (Signed)
[place] for  warfarin Indication:  hx atrial fibrillation  Allergies  Allergen Reactions   Chlorhexidine Gluconate Hives   Penicillins Itching    Has patient had a PCN reaction causing immediate rash, facial/tongue/throat swelling, SOB or lightheadedness with hypotension: no Has patient had a PCN reaction causing severe rash involving mucus membranes or skin necrosis: No Has patient had a PCN reaction that required hospitalization: No Has patient had a PCN reaction occurring within the last 10 years: No If all of the above answers are "NO", then may proceed with Cephalosporin use.  Tolerated Cephalosporin Date: 03/17/21.    Patient Measurements: Height: 5\' 1"  (154.9 cm) Weight: 119.5 kg (263 lb 7.2 oz) IBW/kg (Calculated) : 47.8 Heparin Dosing Weight:   Vital Signs: Temp: 97.9 F (36.6 C) (09/01 0500) Temp Source: Oral (09/01 0500) BP: 119/56 (09/01 0500) Pulse Rate: 59 (09/01 0500)  Labs: Recent Labs    07/06/21 0515 07/07/21 0451  HGB 11.8* 10.6*  HCT 36.8 33.5*  PLT 136* 182  LABPROT 23.9* 28.7*  INR 2.1* 2.7*  CREATININE 1.18* 1.35*    Estimated Creatinine Clearance: 43.5 mL/min (A) (by C-G formula based on SCr of 1.35 mg/dL (H)).  Medications:  - on warfarin 2.5mg  daily except 5mg  on Tue PTA (last dose taken on 8/24)  Assessment: Patient is a 75 y.o F with hx afib on warfarin PTA, presented to the ED on 8/25 with failure to thrive, She also reported of a fall one week ago and reports poor oral intake afterwards. Head CT was negative for any acute abnormality.  Pharmacy has been consulted to dose warfarin while she's hospitalized.  Warfarin held 8/27 with elevated INR, resumed 8/30  Today, 07/08/2021: INR 3.5, above desired range for Afib   Goal of Therapy:  INR 2-3 Monitor platelets by anticoagulation protocol: Yes   Plan:  No Warfarin  today Daily Protime/INR Monitor for s/sx bleeding  Minda Ditto  PharmD [facility] [phone] 07/08/2021, 7:23 AM

## 2021-07-09 DIAGNOSIS — I10 Essential (primary) hypertension: Secondary | ICD-10-CM

## 2021-07-09 DIAGNOSIS — L03116 Cellulitis of left lower limb: Secondary | ICD-10-CM | POA: Diagnosis not present

## 2021-07-09 DIAGNOSIS — N184 Chronic kidney disease, stage 4 (severe): Secondary | ICD-10-CM | POA: Diagnosis not present

## 2021-07-09 DIAGNOSIS — I482 Chronic atrial fibrillation, unspecified: Secondary | ICD-10-CM | POA: Diagnosis not present

## 2021-07-09 DIAGNOSIS — K219 Gastro-esophageal reflux disease without esophagitis: Secondary | ICD-10-CM

## 2021-07-09 LAB — BASIC METABOLIC PANEL
Anion gap: 10 (ref 5–15)
BUN: 37 mg/dL — ABNORMAL HIGH (ref 8–23)
CO2: 30 mmol/L (ref 22–32)
Calcium: 9.6 mg/dL (ref 8.9–10.3)
Chloride: 95 mmol/L — ABNORMAL LOW (ref 98–111)
Creatinine, Ser: 1.44 mg/dL — ABNORMAL HIGH (ref 0.44–1.00)
GFR, Estimated: 38 mL/min — ABNORMAL LOW (ref >60)
Glucose, Bld: 99 mg/dL (ref 70–99)
Potassium: 5 mmol/L (ref 3.5–5.1)
Sodium: 135 mmol/L (ref 135–145)

## 2021-07-09 LAB — PROTIME-INR
INR: 4.2 (ref 0.8–1.2)
Prothrombin Time: 40.1 seconds — ABNORMAL HIGH (ref 11.4–15.2)

## 2021-07-09 LAB — CBC
HCT: 30.9 % — ABNORMAL LOW (ref 36.0–46.0)
Hemoglobin: 9.9 g/dL — ABNORMAL LOW (ref 12.0–15.0)
MCH: 29.6 pg (ref 26.0–34.0)
MCHC: 32 g/dL (ref 30.0–36.0)
MCV: 92.2 fL (ref 80.0–100.0)
Platelets: 246 10*3/uL (ref 150–400)
RBC: 3.35 MIL/uL — ABNORMAL LOW (ref 3.87–5.11)
RDW: 17 % — ABNORMAL HIGH (ref 11.5–15.5)
WBC: 7.4 10*3/uL (ref 4.0–10.5)
nRBC: 0 % (ref 0.0–0.2)

## 2021-07-09 LAB — MAGNESIUM: Magnesium: 2.4 mg/dL (ref 1.7–2.4)

## 2021-07-09 LAB — PHOSPHORUS: Phosphorus: 2.9 mg/dL (ref 2.5–4.6)

## 2021-07-09 MED ORDER — TORSEMIDE 20 MG PO TABS
40.0000 mg | ORAL_TABLET | Freq: Two times a day (BID) | ORAL | Status: DC
  Administered 2021-07-09 – 2021-07-22 (×24): 40 mg via ORAL
  Filled 2021-07-09 (×27): qty 2

## 2021-07-09 NOTE — Care Management Important Message (Signed)
Important Message  Patient Details IM Letter placed in Patient's room. Name: Katelyn Jackson MRN: [id number] Date of Birth: May 27, 1946   Medicare Important Message Given:  Yes     Kerin Salen 07/09/2021, 12:23 PM

## 2021-07-09 NOTE — Progress Notes (Signed)
Physical Therapy Treatment Patient Details Name: Katelyn Jackson MRN: [id number] DOB: 1946-04-06 Today's Date: 07/09/2021    History of Present Illness Katelyn Jackson is a 75 y.o. female brought by son to the ER for further evaluation of recent fall. Patient's son at the bedside is the historian:.  He tells me that patient fell from the bed on Monday and since then she is complaining of left shoulder pain, left ankle pain and bilateral knee pain, she is unable to walk, stand, go to bathroom and progressively declining. PMH: permanent A. fib, HTN, hyperlipidemia, s/p pacemaker placement, breast cancer, hypothyroidism, chronic bilateral lymphedema, recurrent falls, obesity, GERD, pulmonary hypertension    PT Comments    General Comments: AxO x3 used I Pad interpretor during session until Son arrived at end. General bed mobility comments: Total Assist + 2 to transition from supine to EOB pt <5% able.  Max c/o B LE pain just below the knees and R shoulder pain with decreased functional use. General transfer comment: Pt sat EOB x 7 min at Supervision leval once upright but was unable to weight shift either side.  Attempted sit to stand however pt was unable to off load from bed mainly due to PAIN stated pt and "not feel good".  Used Maxi Move to assist pt from EOB to recliner. Son was surprised his Mom could not stand.  Pt stated main reason was pain.  Pt will need ST Rehab at SNF prior to returning home with Son.    Follow Up Recommendations        Equipment Recommendations  None recommended by PT    Recommendations for Other Services       Precautions / Restrictions Precautions Precautions: Fall Precaution Comments: leg pain, right shoulder TSA now painful-negative for fx/dislocation Restrictions Other Position/Activity Restrictions: speaks russian    Mobility  Bed Mobility Overal bed mobility: Needs Assistance Bed Mobility: Supine to Sit     Supine to sit: Total assist;HOB  elevated     General bed mobility comments: Total Assist + 2 to transition from supine to EOB pt <5% able.  Max c/o B LE pain just below the knees and R shoulder pain with decreased functional use    Transfers Overall transfer level: Needs assistance               General transfer comment: Pt sat EOB x 7 min at Supervision leval once upright but was unable to weight shift either side.  Attempted sit to stand however pt was unable to off load from bed mainly due to PAIN stated pt and "not feel good".  Used Maxi Move to assist pt from EOB to recliner.  Ambulation/Gait                 Stairs             Wheelchair Mobility    Modified Rankin (Stroke Patients Only)       Balance                                            Cognition Arousal/Alertness: Awake/alert Behavior During Therapy: WFL for tasks assessed/performed;Anxious Overall Cognitive Status: Within Functional Limits for tasks assessed  General Comments: AxO x3 used I Pad interpretor during session until Son arrived at end      Exercises      General Comments        Pertinent Vitals/Pain Pain Assessment: Faces Faces Pain Scale: Hurts whole lot Pain Location: bil knees, L ankle and R shoulder Pain Descriptors / Indicators: Grimacing;Moaning;Guarding Pain Intervention(s): Monitored during session;Premedicated before session;Repositioned    Home Living                      Prior Function            PT Goals (current goals can now be found in the care plan section) Progress towards PT goals: Progressing toward goals    Frequency    Min 2X/week      PT Plan Current plan remains appropriate    Co-evaluation              AM-PAC PT "6 Clicks" Mobility   Outcome Measure  Help needed turning from your back to your side while in a flat bed without using bedrails?: Total Help needed moving from lying on  your back to sitting on the side of a flat bed without using bedrails?: Total Help needed moving to and from a bed to a chair (including a wheelchair)?: Total Help needed standing up from a chair using your arms (e.g., wheelchair or bedside chair)?: Total Help needed to walk in hospital room?: Total Help needed climbing 3-5 steps with a railing? : Total 6 Click Score: 6    End of Session Equipment Utilized During Treatment: Gait belt Activity Tolerance: Patient limited by fatigue;Patient limited by pain Patient left: in chair;with chair alarm set;with call bell/phone within reach;with family/visitor present Nurse Communication: Mobility status;Need for lift equipment PT Visit Diagnosis: Other abnormalities of gait and mobility (R26.89);Muscle weakness (generalized) (M62.81)     Time:  - 1030  -  1100    Charges:    2 ta                    Rica Koyanagi  PTA Acute  Rehabilitation Services Pager      [phone] Office      [phone]

## 2021-07-09 NOTE — Progress Notes (Addendum)
PROGRESS NOTE    Katelyn Jackson  ZGY:[id number] DOB: 05-13-1946 DOA: 07/01/2021 PCP: Cassandria Anger, MD    Brief Narrative:  Katelyn Jackson was admitted to the hospital with a working diagnosis of cellulitis left lower extremity.  75 year old female past medical history for atrial fibrillation, hypertension, dyslipidemia, hypothyroidism, lower extremity lymphedema, obesity, GERD and pulmonary hypertension who presented with ankle pain.  Apparently patient sustained a mechanical fall, left ankle and left shoulder injury.  Decreased ambulation.  Persistent pain that prompted her to come to the hospital.  On her initial physical examination she was afebrile, blood pressure 137/66, respiratory rate 16, heart rate 99, oxygen saturation 97%, her lungs were clear to auscultation bilaterally, heart S1-S2, present rhythmic, her abdomen was soft nontender, positive lymphedema lower extremities with erythema at the left posterior leg.  Sodium 137, potassium 4.0, chloride 100, bicarb 27, glucose 91, BUN 56, creatinine 1.9, white count 13.2, hemoglobin 10.1, hematocrit 32.2, platelets 107. SARS COVID-19 negative.  Patient was placed on antibiotic therapy.  She was diagnosed with heart failure exacerbation received furosemide for volume overload.  Condition has been improving, currently pending transfer to skilled nursing facility.  Assessment & Plan:   Principal Problem:   Cellulitis of left lower extremity Active Problems:   Essential hypertension   Chronic atrial fibrillation (HCC)   Lymphedema   Anemia of chronic disease   CRF (chronic renal failure), stage 4 (severe) (HCC)   Morbid obesity (HCC)   GERD (gastroesophageal reflux disease)   Hypothyroidism   Supratherapeutic INR   Pulmonary edema   Pressure injury of skin   Left lower extremity cellulitis. Erythema and tenderness have improved. Patient continue very weak and deconditioned not at her baseline.   Continue Pt and OT,  currently medically stable to be transfer to SNF.   2. Moderate to sever osteoarthritis. Chronic right shoulder dislocation. Ambulatory dysfunction, right thigh stage 3 and sacrum stage 2 pressure ulcers, present on admission.  Continue pain control and physical therapy I called today Orthopedics about right shoulder CT findings and dislocation is not new. Continue pain control and physical/ occupational therapy.  Continue skin care.   3. Acute hypoxemic respiratory failure due to acute on chronic systolic heart failure.  Patient had diuresis with negative fluid balance. Continue close monitoring of oxygenation, and continue with oral diuretics, resume home torsemide, will resume with 40 mg po bid.  Continue blood pressure monitoring.  4. AKI on CKD stage 3, hyperkalemia. Renal function has been stable.  Discontinue K supplementation and follow renal function in am. Renal function with serum cr at 1,44 with K at 5,0 and serum bicarbonate at 30.   5. HTN. Continue carvedilol for blood pressure control.   6. Chronic atrial fibrillation.  Continue rate control with carvedilol and anticoagulation with warfarin.   7. Hypothyroid. Continue with levothyroxine  8. Anemia of chronic disease/ hx of breast cancer. Follow up on hgb and hct.    Status is: Inpatient  Remains inpatient appropriate because:Inpatient level of care appropriate due to severity of illness  Dispo: The patient is from: Home              Anticipated d/c is to: SNF              Patient currently is medically stable to d/c.   Difficult to place patient No    DVT prophylaxis: warfarin   Code Status:    full  Family Communication:  I spoke with patient's son at the  bedside, we talked in detail about patient's condition, plan of care and prognosis and all questions were addressed.      Nutrition Status:           Skin Documentation: Pressure Injury 07/03/21 Thigh Posterior;Right Stage 3 -  Full thickness  tissue loss. Subcutaneous fat may be visible but bone, tendon or muscle are NOT exposed. (Active)  07/03/21 0453  Location: Thigh  Location Orientation: Posterior;Right  Staging: Stage 3 -  Full thickness tissue loss. Subcutaneous fat may be visible but bone, tendon or muscle are NOT exposed.  Wound Description (Comments):   Present on Admission: Yes     Pressure Injury 07/03/21 Sacrum Stage 2 -  Partial thickness loss of dermis presenting as a shallow open injury with a red, pink wound bed without slough. (Active)  07/03/21 2105  Location: Sacrum  Location Orientation:   Staging: Stage 2 -  Partial thickness loss of dermis presenting as a shallow open injury with a red, pink wound bed without slough.  Wound Description (Comments):   Present on Admission: Yes     Consultants:  orthopedics    Subjective: Patient with persistent pain at the right shoulder and left leg, worse to touch and movement, very weak and deconditioned, no dyspnea or chest pain.   Objective: Vitals:   07/08/21 2036 07/09/21 0419 07/09/21 0500 07/09/21 1454  BP: 135/61 119/61  (!) 117/57  Pulse: 64 64  (!) 59  Resp: 20 20  18   Temp: 98.2 F (36.8 C) 99.5 F (37.5 C)  100 F (37.8 C)  TempSrc: Oral Oral  Oral  SpO2: 92% 93%  93%  Weight:   117.1 kg   Height:        Intake/Output Summary (Last 24 hours) at 07/09/2021 1605 Last data filed at 07/09/2021 0420 Gross per 24 hour  Intake --  Output 1050 ml  Net -1050 ml   Filed Weights   07/07/21 0445 07/08/21 0500 07/09/21 0500  Weight: 114.2 kg 119.5 kg 117.1 kg    Examination:   General: Not in pain or dyspnea, deconditioned  Neurology: Awake and alert, non focal  E ENT: no pallor, no icterus, oral mucosa moist Cardiovascular: No JVD. S1-S2 present, rhythmic, no gallops, rubs, or murmurs. +++ non pitting lymphedema at the lower extremity edema. Pulmonary: positive breath sounds bilaterally, with no wheezing, rhonchi or rales. Gastrointestinal.  Abdomen soft and non tender Skin. No rashes Musculoskeletal: hypertrophic knees and edema at the right shoulder.      Data Reviewed: I have personally reviewed following labs and imaging studies  CBC: Recent Labs  Lab 07/05/21 0430 07/06/21 0515 07/07/21 0451 07/08/21 0800 07/09/21 0517  WBC 8.2 7.5 8.7 7.2 7.4  HGB 9.9* 11.8* 10.6* 10.0* 9.9*  HCT 31.2* 36.8 33.5* 30.9* 30.9*  MCV 94.0 91.5 92.3 92.0 92.2  PLT 138* 136* 182 187 326   Basic Metabolic Panel: Recent Labs  Lab 07/04/21 0527 07/05/21 0430 07/06/21 0515 07/07/21 0451 07/08/21 0800 07/09/21 0517  NA 139 141 138 142 138 135  K 3.3* 3.0* 3.2* 3.7 4.0 5.0  CL 98 98 97* 99 97* 95*  CO2 32 33* 33* 35* 35* 30  GLUCOSE 101* 108* 113* 125* 111* 99  BUN 54* 43* 35* 36* 36* 37*  CREATININE 1.71* 1.28* 1.18* 1.35* 1.27* 1.44*  CALCIUM 9.8 9.6 9.3 9.8 9.7 9.6  MG  --   --   --   --  2.2 2.4  PHOS 3.1  --   --   --  2.8 2.9   GFR: Estimated Creatinine Clearance: 40.2 mL/min (A) (by C-G formula based on SCr of 1.44 mg/dL (H)). Liver Function Tests: Recent Labs  Lab 07/03/21 0541 07/04/21 0527 07/05/21 0430 07/06/21 0515  AST 13* 27 34 49*  ALT 14 17 20 27   ALKPHOS 140* 230* 235* 317*  BILITOT 3.6* 3.2* 3.1* 3.2*  PROT 7.1 6.7 6.3* 6.3*  ALBUMIN 2.7* 2.4* 2.4* 2.2*   No results for input(s): LIPASE, AMYLASE in the last 168 hours. No results for input(s): AMMONIA in the last 168 hours. Coagulation Profile: Recent Labs  Lab 07/05/21 0430 07/06/21 0515 07/07/21 0451 07/08/21 0800 07/09/21 0517  INR 9.1* 2.1* 2.7* 3.5* 4.2*   Cardiac Enzymes: No results for input(s): CKTOTAL, CKMB, CKMBINDEX, TROPONINI in the last 168 hours. BNP (last 3 results) No results for input(s): PROBNP in the last 8760 hours. HbA1C: No results for input(s): HGBA1C in the last 72 hours. CBG: No results for input(s): GLUCAP in the last 168 hours. Lipid Profile: No results for input(s): CHOL, HDL, LDLCALC, TRIG, CHOLHDL,  LDLDIRECT in the last 72 hours. Thyroid Function Tests: No results for input(s): TSH, T4TOTAL, FREET4, T3FREE, THYROIDAB in the last 72 hours. Anemia Panel: No results for input(s): VITAMINB12, FOLATE, FERRITIN, TIBC, IRON, RETICCTPCT in the last 72 hours.    Radiology Studies: I have reviewed all of the imaging during this hospital visit personally     Scheduled Meds:  carvedilol  25 mg Oral BID   cholecalciferol  2,000 Units Oral Daily   exemestane  25 mg Oral QPC breakfast   furosemide  40 mg Intravenous BID   levothyroxine  50 mcg Oral QAC breakfast   magnesium oxide  400 mg Oral BID   pantoprazole  40 mg Oral Daily   pravastatin  20 mg Oral q1800   vitamin B-12  2,000 mcg Oral Daily   Warfarin - Pharmacist Dosing Inpatient   Does not apply q1600   Continuous Infusions:  sodium chloride 250 mL (07/04/21 0822)     LOS: 7 days        Malvina Schadler Gerome Apley, MD

## 2021-07-09 NOTE — Progress Notes (Signed)
[place] for  warfarin Indication:  hx atrial fibrillation  Allergies  Allergen Reactions   Chlorhexidine Gluconate Hives   Penicillins Itching    Has patient had a PCN reaction causing immediate rash, facial/tongue/throat swelling, SOB or lightheadedness with hypotension: no Has patient had a PCN reaction causing severe rash involving mucus membranes or skin necrosis: No Has patient had a PCN reaction that required hospitalization: No Has patient had a PCN reaction occurring within the last 10 years: No If all of the above answers are "NO", then may proceed with Cephalosporin use.  Tolerated Cephalosporin Date: 03/17/21.    Patient Measurements: Height: 5\' 1"  (154.9 cm) Weight: 117.1 kg (258 lb 2.5 oz) IBW/kg (Calculated) : 47.8   Vital Signs: Temp: 99.5 F (37.5 C) (09/02 0419) Temp Source: Oral (09/02 0419) BP: 119/61 (09/02 0419) Pulse Rate: 64 (09/02 0419)  Labs: Recent Labs    07/07/21 0451 07/08/21 0800 07/09/21 0517  HGB 10.6* 10.0* 9.9*  HCT 33.5* 30.9* 30.9*  PLT 182 187 246  LABPROT 28.7* 35.4* 40.1*  INR 2.7* 3.5* 4.2*  CREATININE 1.35* 1.27* 1.44*    Estimated Creatinine Clearance: 40.2 mL/min (A) (by C-G formula based on SCr of 1.44 mg/dL (H)).  Medications:  - on warfarin 2.5mg  daily except 5mg  on Tue PTA (last dose taken on 8/24)  Assessment: Patient is a 75 y.o F with hx afib on warfarin PTA, presented to the ED on 8/25 with failure to thrive, She also reported of a fall one week ago and reports poor oral intake afterwards. Head CT was negative for any acute abnormality.  Pharmacy has been consulted to dose warfarin while she's hospitalized.  - Vitamin K 2.5mg  PO x 1 given on 8/26 and 8/29 for elevated INR - Warfarin resumed 8/30  Today, 07/09/2021: INR 4.2, continues to rise despite no warfarin yesterday CBC:  Hgb 9.9, low but relatively stable. Pltc WNL   Goal of Therapy:  INR 2-3 Monitor platelets  by anticoagulation protocol: Yes   Plan:  Hold warfarin today Daily PT/INR Monitor CBC and for s/sx of bleeding   Lindell Spar, PharmD, BCPS Clinical Pharmacist  07/09/2021, 12:33 PM

## 2021-07-10 DIAGNOSIS — L03116 Cellulitis of left lower limb: Secondary | ICD-10-CM | POA: Diagnosis not present

## 2021-07-10 LAB — CBC
HCT: 31.8 % — ABNORMAL LOW (ref 36.0–46.0)
Hemoglobin: 10.1 g/dL — ABNORMAL LOW (ref 12.0–15.0)
MCH: 29.9 pg (ref 26.0–34.0)
MCHC: 31.8 g/dL (ref 30.0–36.0)
MCV: 94.1 fL (ref 80.0–100.0)
Platelets: 248 10*3/uL (ref 150–400)
RBC: 3.38 MIL/uL — ABNORMAL LOW (ref 3.87–5.11)
RDW: 17.1 % — ABNORMAL HIGH (ref 11.5–15.5)
WBC: 7.2 10*3/uL (ref 4.0–10.5)
nRBC: 0 % (ref 0.0–0.2)

## 2021-07-10 LAB — PROTIME-INR
INR: 5 (ref 0.8–1.2)
Prothrombin Time: 46.4 seconds — ABNORMAL HIGH (ref 11.4–15.2)

## 2021-07-10 LAB — BASIC METABOLIC PANEL
Anion gap: 7 (ref 5–15)
BUN: 33 mg/dL — ABNORMAL HIGH (ref 8–23)
CO2: 33 mmol/L — ABNORMAL HIGH (ref 22–32)
Calcium: 10.2 mg/dL (ref 8.9–10.3)
Chloride: 97 mmol/L — ABNORMAL LOW (ref 98–111)
Creatinine, Ser: 1.28 mg/dL — ABNORMAL HIGH (ref 0.44–1.00)
GFR, Estimated: 44 mL/min — ABNORMAL LOW (ref >60)
Glucose, Bld: 96 mg/dL (ref 70–99)
Potassium: 3.9 mmol/L (ref 3.5–5.1)
Sodium: 137 mmol/L (ref 135–145)

## 2021-07-10 MED ORDER — DOXYCYCLINE HYCLATE 100 MG PO TABS
100.0000 mg | ORAL_TABLET | Freq: Two times a day (BID) | ORAL | Status: DC
  Administered 2021-07-10 – 2021-07-15 (×11): 100 mg via ORAL
  Filled 2021-07-10 (×11): qty 1

## 2021-07-10 NOTE — Progress Notes (Signed)
PROGRESS NOTE    Katelyn Jackson  INO:[id number] DOB: November 15, 1945 DOA: 07/01/2021 PCP: Cassandria Anger, MD    Brief Narrative:  This 75 years old female with PMH significant for permanent A. fib on Coumadin, hypertension, hyperlipidemia, pacemaker in place, history of breast cancer, hypothyroidism, chronic bilateral lymphedema, recurrent falls, morbid obesity, BMI 51, GERD, pulmonary hypertension who needs full assistance for daily life activities, She has home health aide 6 times a week at home,  brought to the ED by her son for the evaluation of recent fall on last Monday.  Patient fell from bed on Monday since then complaining about left shoulder pain,  left ankle pain and bilateral knee pain and unable to walk or stand, go to the bathroom and progressively declining. She was found to have left lower extremity cellulitis.  Patient completed antibiotics.  Cellulitis has resolved.  Patient was given vitamin K due to supratherapeutic INR.  She was also given Lasix for CHF exacerbation.  She was managed with IV antibiotics for cellulitis,  IV Lasix for CHF.  ABG showed CO2 retention-chronic hypercapnia compensated.  Assessment & Plan:   Principal Problem:   Cellulitis of left lower extremity Active Problems:   Essential hypertension   Chronic atrial fibrillation (HCC)   Lymphedema   Anemia of chronic disease   CRF (chronic renal failure), stage 4 (severe) (HCC)   Morbid obesity (HCC)   GERD (gastroesophageal reflux disease)   Hypothyroidism   Supratherapeutic INR   Pulmonary edema   Pressure injury of skin  Cellulitis of left lower extremity /Chronic bilateral lymphedema: Completed antibiotics.  Erythema and tenderness has resolved. Continue adequate pain control.  Recurrent falls / Ambulatory dysfunction / moderate to severe osteoarthritis: Patient had complete skeletal survey.  X-ray of the knees and left ankle: No fracture Right hip: Degenerative disc pain.  Patient has  chronic pain for years. Dr. Para March orthopedics was consulted.  Adequate pain control with oral and IV opiates. Despite the need for bilateral total knee replacements,  she is certainly not a surgical candidate from cardiac renal and body habitus standpoint. PT and OT recommended  skilled nursing facility. Patient currently medically stable for transfer to SNF  Acute hypoxic respiratory failure could be multifactorial. secondary to fluid overload,  acute on chronic systolic CHF, hypercapnia likely from OSA. Respiratory status much improved.  Patient had adequate diuresis with negative fluid balance. Continue supplemental oxygen to keep saturation above 94%. Diuretics changed to torsemide po 40 mg twice daily.  AKI on CKD stage IIIb: Baseline serum creatinine 1.4-1.6 Creatinine improved with IV Lasix and remained stable.  Essential hypertension: Continue Coreg  Chronic atrial fibrillation: Continue Coreg, heart rate controlled. Coumadin is on hold because of supratherapeutic INR.   INR today is 4.2  Pacemaker in place: Stable.  Hypothyroidism: Continue levothyroxine.  Anemia of chronic disease: Hemoglobin remained stable, no obvious bleeding.  History of breast cancer: Outpatient follow-up.  Right shoulder pain and swelling: Venous duplex ruled out DVT. Right shoulder x-ray and CT scan showed anterior shoulder dislocation. Ortho recommended no acute intervention, seems chronic.  DVT prophylaxis: Coumadin Code Status: Full code. Family Communication: No family at bed side. Disposition Plan:   Status is: Inpatient  Remains inpatient appropriate because:Inpatient level of care appropriate due to severity of illness  Dispo: The patient is from: Home              Anticipated d/c is to: SNF  Patient currently is not medically stable to d/c.   Difficult to place patient No  Consultants:  None  Procedures:   Antimicrobials:   Anti-infectives (From  admission, onward)    Start     Dose/Rate Route Frequency Ordered Stop   07/05/21 1245  cefadroxil (DURICEF) capsule 500 mg        500 mg Oral 2 times daily 07/05/21 1155 07/07/21 2217   07/03/21 0900  cefTRIAXone (ROCEPHIN) 2 g in sodium chloride 0.9 % 100 mL IVPB  Status:  Discontinued        2 g 200 mL/hr over 30 Minutes Intravenous Every 24 hours 07/02/21 0950 07/05/21 1155   07/02/21 0915  cefTRIAXone (ROCEPHIN) 2 g in sodium chloride 0.9 % 100 mL IVPB        2 g 200 mL/hr over 30 Minutes Intravenous  Once 07/02/21 9983 07/02/21 1033        Subjective: Patient was seen and examined at bedside.  Overnight events noted. Leg cellulitis is resolved,  she does have chronic bilateral lymphedema.  She still reports having chronic pain. Right arm appears swollen because of anterior shoulder dislocation, venous duplex ruled out DVT.  Objective: Vitals:   07/10/21 0623 07/10/21 0804 07/10/21 1053 07/10/21 1109  BP:  131/65 131/66 111/61  Pulse:   61 61  Resp:    18  Temp:   98.8 F (37.1 C) 99.6 F (37.6 C)  TempSrc:   Oral Oral  SpO2:   94% 94%  Weight: 116.4 kg     Height:        Intake/Output Summary (Last 24 hours) at 07/10/2021 1141 Last data filed at 07/10/2021 1047 Gross per 24 hour  Intake 480 ml  Output 800 ml  Net -320 ml   Filed Weights   07/08/21 0500 07/09/21 0500 07/10/21 0623  Weight: 119.5 kg 117.1 kg 116.4 kg    Examination:  General exam: Appears comfortable, chronically ill looking, not in acute distress. Respiratory system: Clear to auscultation bilaterally, respiratory effort normal. Cardiovascular system: S1-S2 heard, irregular rhythm, rate normal, no murmur Gastrointestinal system: Abdomen is soft, nontender, nondistended, BS+ Central nervous system: Alert and oriented x 3. No focal neurological deficits. Extremities: Bilateral lymphedema noted, right shoulder and arm swelling from dislocation. Right shoulder tenderness , tenderness when trying to  lift arm. Skin: No rashes, lesions or ulcers Psychiatry: Judgement and insight appear normal. Mood & affect appropriate.     Data Reviewed: I have personally reviewed following labs and imaging studies  CBC: Recent Labs  Lab 07/05/21 0430 07/06/21 0515 07/07/21 0451 07/08/21 0800 07/09/21 0517  WBC 8.2 7.5 8.7 7.2 7.4  HGB 9.9* 11.8* 10.6* 10.0* 9.9*  HCT 31.2* 36.8 33.5* 30.9* 30.9*  MCV 94.0 91.5 92.3 92.0 92.2  PLT 138* 136* 182 187 382   Basic Metabolic Panel: Recent Labs  Lab 07/04/21 0527 07/05/21 0430 07/06/21 0515 07/07/21 0451 07/08/21 0800 07/09/21 0517 07/10/21 0818  NA 139   < > 138 142 138 135 137  K 3.3*   < > 3.2* 3.7 4.0 5.0 3.9  CL 98   < > 97* 99 97* 95* 97*  CO2 32   < > 33* 35* 35* 30 33*  GLUCOSE 101*   < > 113* 125* 111* 99 96  BUN 54*   < > 35* 36* 36* 37* 33*  CREATININE 1.71*   < > 1.18* 1.35* 1.27* 1.44* 1.28*  CALCIUM 9.8   < > 9.3 9.8 9.7  9.6 10.2  MG  --   --   --   --  2.2 2.4  --   PHOS 3.1  --   --   --  2.8 2.9  --    < > = values in this interval not displayed.   GFR: Estimated Creatinine Clearance: 45.1 mL/min (A) (by C-G formula based on SCr of 1.28 mg/dL (H)). Liver Function Tests: Recent Labs  Lab 07/04/21 0527 07/05/21 0430 07/06/21 0515  AST 27 34 49*  ALT 17 20 27   ALKPHOS 230* 235* 317*  BILITOT 3.2* 3.1* 3.2*  PROT 6.7 6.3* 6.3*  ALBUMIN 2.4* 2.4* 2.2*   No results for input(s): LIPASE, AMYLASE in the last 168 hours. No results for input(s): AMMONIA in the last 168 hours. Coagulation Profile: Recent Labs  Lab 07/05/21 0430 07/06/21 0515 07/07/21 0451 07/08/21 0800 07/09/21 0517  INR 9.1* 2.1* 2.7* 3.5* 4.2*   Cardiac Enzymes: No results for input(s): CKTOTAL, CKMB, CKMBINDEX, TROPONINI in the last 168 hours. BNP (last 3 results) No results for input(s): PROBNP in the last 8760 hours. HbA1C: No results for input(s): HGBA1C in the last 72 hours. CBG: No results for input(s): GLUCAP in the last 168  hours. Lipid Profile: No results for input(s): CHOL, HDL, LDLCALC, TRIG, CHOLHDL, LDLDIRECT in the last 72 hours. Thyroid Function Tests: No results for input(s): TSH, T4TOTAL, FREET4, T3FREE, THYROIDAB in the last 72 hours. Anemia Panel: No results for input(s): VITAMINB12, FOLATE, FERRITIN, TIBC, IRON, RETICCTPCT in the last 72 hours. Sepsis Labs: No results for input(s): PROCALCITON, LATICACIDVEN in the last 168 hours.  Recent Results (from the past 240 hour(s))  Urine Culture     Status: Abnormal   Collection Time: 07/01/21  2:39 PM   Specimen: Urine, Clean Catch  Result Value Ref Range Status   Specimen Description   Final    URINE, CLEAN CATCH Performed at [facility], [place] [address] [place], [place] [address]    Special Requests   Final    NONE Performed at [facility], [place] [address], [place], [place] [address]    Culture MULTIPLE SPECIES PRESENT, SUGGEST RECOLLECTION (A)  Final   Report Status 07/02/2021 FINAL  Final  SARS CORONAVIRUS 2 (TAT 6-24 HRS) Nasopharyngeal Nasopharyngeal Swab     Status: None   Collection Time: 07/02/21 11:19 AM   Specimen: Nasopharyngeal Swab  Result Value Ref Range Status   SARS Coronavirus 2 NEGATIVE NEGATIVE Final    Comment: (NOTE) SARS-CoV-2 target nucleic acids are NOT DETECTED.  The SARS-CoV-2 RNA is generally detectable in upper and lower respiratory specimens during the acute phase of infection. Negative results do not preclude SARS-CoV-2 infection, do not rule out co-infections with other pathogens, and should not be used as the sole basis for treatment or other patient management decisions. Negative results must be combined with clinical observations, patient history, and epidemiological information. The expected result is Negative.  Fact Sheet for Patients: SugarRoll.be  Fact Sheet for Healthcare  Providers: https://www.woods-mathews.com/  This test is not yet approved or cleared by the [place] FDA and  has been authorized for detection and/or diagnosis of SARS-CoV-2 by FDA under an Emergency Use Authorization (EUA). This EUA will remain  in effect (meaning this test can be used) for the duration of the COVID-19 declaration under Se ction 564(b)(1) of the Act, 21 U.S.C. section 360bbb-3(b)(1), unless the authorization is terminated or revoked sooner.  Performed at [facility], [place]  [address], [place], [place] [address]     Radiology Studies: No results found.  Scheduled Meds:  carvedilol  25 mg Oral BID   cholecalciferol  2,000 Units Oral Daily   exemestane  25 mg Oral QPC breakfast   levothyroxine  50 mcg Oral QAC breakfast   magnesium oxide  400 mg Oral BID   pantoprazole  40 mg Oral Daily   pravastatin  20 mg Oral q1800   torsemide  40 mg Oral BID   vitamin B-12  2,000 mcg Oral Daily   Warfarin - Pharmacist Dosing Inpatient   Does not apply q1600   Continuous Infusions:  sodium chloride 250 mL (07/04/21 0822)     LOS: 8 days    Time spent: 25 mins    Geoffrey Hynes, MD [facility]   If 7PM-7AM, please contact night-coverage

## 2021-07-10 NOTE — Progress Notes (Signed)
[place] for  warfarin Indication:  hx atrial fibrillation  Allergies  Allergen Reactions   Chlorhexidine Gluconate Hives   Penicillins Itching    Has patient had a PCN reaction causing immediate rash, facial/tongue/throat swelling, SOB or lightheadedness with hypotension: no Has patient had a PCN reaction causing severe rash involving mucus membranes or skin necrosis: No Has patient had a PCN reaction that required hospitalization: No Has patient had a PCN reaction occurring within the last 10 years: No If all of the above answers are "NO", then may proceed with Cephalosporin use.  Tolerated Cephalosporin Date: 03/17/21.    Patient Measurements: Height: 5\' 1"  (154.9 cm) Weight: 116.4 kg (256 lb 9.9 oz) IBW/kg (Calculated) : 47.8   Vital Signs: Temp: 99.6 F (37.6 C) (09/03 1109) Temp Source: Oral (09/03 1109) BP: 111/61 (09/03 1109) Pulse Rate: 61 (09/03 1109)  Labs: Recent Labs    07/08/21 0800 07/09/21 0517 07/10/21 0818 07/10/21 1134  HGB 10.0* 9.9* 10.1*  --   HCT 30.9* 30.9* 31.8*  --   PLT 187 246 248  --   LABPROT 35.4* 40.1*  --  46.4*  INR 3.5* 4.2*  --  5.0*  CREATININE 1.27* 1.44* 1.28*  --    Estimated Creatinine Clearance: 45.1 mL/min (A) (by C-G formula based on SCr of 1.28 mg/dL (H)).  Medications:  - on warfarin 2.5mg  daily except 5mg  on Tue PTA (last dose taken on 8/24)  Assessment: Patient is a 75 y.o F with hx afib on warfarin PTA, presented to the ED on 8/25 with failure to thrive, She also reported of a fall one week ago and reports poor oral intake afterwards. Head CT was negative for any acute abnormality.  Pharmacy has been consulted to dose warfarin while she's hospitalized.  - Vitamin K 2.5mg  PO x 1 given on 8/26 and 8/29 for elevated INR - Warfarin resumed 8/30  Today, 07/10/2021: INR 5, continues to rise despite no warfarin x 2 days CBC: Hgb low but stable at 10.1, Pltc WNL  Goal of Therapy:   INR 2-3 Monitor platelets by anticoagulation protocol: Yes   Plan:  Hold warfarin today Daily PT/INR Monitor CBC and for s/sx of bleeding   Lindell Spar, PharmD, BCPS Clinical Pharmacist  07/10/2021, 2:42 PM

## 2021-07-10 NOTE — Progress Notes (Signed)
   ORTHOPAEDIC PROGRESS NOTE  SUBJECTIVE: Patient continues to have pain in her shoulder and legs.   OBJECTIVE: PE: Right shoulder: redness and tenderness at the distal aspect of her incision. She tolerates about 80 degrees of passive forward elevation. She endorses distal sensation. Warm well perfused foot.  Bilateral lower extremities: severely obese legs with bilateral lymphedema. Redness noted on the left lower leg. Unable to palpate DP pulses due to significant swelling and hypersensitivity. Tender to palpation throughout  Vitals:   07/10/21 1053 07/10/21 1109  BP: 131/66 111/61  Pulse: 61 61  Resp:  18  Temp: 98.8 F (37.1 C) 99.6 F (37.6 C)  SpO2: 94% 94%     ASSESSMENT: Katelyn Jackson is a 75 y.o. female  - Lower extremity cellulitis - s/p left shoulder hemiarthroplasty for unreconstructable shoulder - bilateral knee osteoarthritis - bilateral leg lymphedema - morbid obesity   PLAN: - Left shoulder: Patient's shoulder chronically appears dislocated due to her unreconstructable shoulder from previous right humerus and scapula fractures with end-stage degenerative changes in the joint. She had a right hemiarthroplasty in 03/2021. Appearance of her shoulder is chronic. She does have redness and tenderness at the distal aspect of her incision. No signs of active drainage. Could be a stitch abscess. She is currently on doxycycline which would cover this. Continue with Doxycycline. Will continue to monitor for changes - Bilateral lower extremities: Nothing further from orthopedic surgery.  Despite the need for bilateral total knee replacements she is certainly not a surgical candidate from a cardiac, renal or body habitus standpoint.  Would benefit from skilled PT and OT for mobility purposes at a SNF as she could not feed herself or mobilize herself within the bed much less transferring out of bed.    [name], PA-C 07/10/2021

## 2021-07-11 DIAGNOSIS — L03116 Cellulitis of left lower limb: Secondary | ICD-10-CM | POA: Diagnosis not present

## 2021-07-11 LAB — CBC
HCT: 31.1 % — ABNORMAL LOW (ref 36.0–46.0)
Hemoglobin: 9.9 g/dL — ABNORMAL LOW (ref 12.0–15.0)
MCH: 29.6 pg (ref 26.0–34.0)
MCHC: 31.8 g/dL (ref 30.0–36.0)
MCV: 93.1 fL (ref 80.0–100.0)
Platelets: 251 10*3/uL (ref 150–400)
RBC: 3.34 MIL/uL — ABNORMAL LOW (ref 3.87–5.11)
RDW: 16.9 % — ABNORMAL HIGH (ref 11.5–15.5)
WBC: 6.1 10*3/uL (ref 4.0–10.5)
nRBC: 0 % (ref 0.0–0.2)

## 2021-07-11 LAB — PROTIME-INR
INR: 4 — ABNORMAL HIGH (ref 0.8–1.2)
Prothrombin Time: 38.7 seconds — ABNORMAL HIGH (ref 11.4–15.2)

## 2021-07-11 NOTE — Progress Notes (Signed)
[place] for  warfarin Indication:  hx atrial fibrillation  Allergies  Allergen Reactions   Chlorhexidine Gluconate Hives   Penicillins Itching    Has patient had a PCN reaction causing immediate rash, facial/tongue/throat swelling, SOB or lightheadedness with hypotension: no Has patient had a PCN reaction causing severe rash involving mucus membranes or skin necrosis: No Has patient had a PCN reaction that required hospitalization: No Has patient had a PCN reaction occurring within the last 10 years: No If all of the above answers are "NO", then may proceed with Cephalosporin use.  Tolerated Cephalosporin Date: 03/17/21.    Patient Measurements: Height: 5\' 1"  (154.9 cm) Weight: 116 kg (255 lb 11.7 oz) IBW/kg (Calculated) : 47.8   Vital Signs: Temp: 98.2 F (36.8 C) (09/04 0504) Temp Source: Oral (09/04 0504) BP: 143/71 (09/04 0504) Pulse Rate: 58 (09/04 0504)  Labs: Recent Labs    07/09/21 0517 07/10/21 0818 07/10/21 1134 07/11/21 0510  HGB 9.9* 10.1*  --  9.9*  HCT 30.9* 31.8*  --  31.1*  PLT 246 248  --  251  LABPROT 40.1*  --  46.4* 38.7*  INR 4.2*  --  5.0* 4.0*  CREATININE 1.44* 1.28*  --   --     Estimated Creatinine Clearance: 45 mL/min (A) (by C-G formula based on SCr of 1.28 mg/dL (H)).  Medications:  - on warfarin 2.5mg  daily except 5mg  on Tue PTA (last dose taken on 8/24)  Assessment: Patient is a 75 y.o F with hx afib on warfarin PTA, presented to the ED on 8/25 with failure to thrive, She also reported of a fall one week ago and reports poor oral intake afterwards. Head CT was negative for any acute abnormality.  Pharmacy has been consulted to dose warfarin while she's hospitalized.  - Vitamin K 2.5mg  PO x 1 given on 8/26 and 8/29 for elevated INR - Warfarin resumed 8/30  Today, 07/11/2021: INR 4, remains supratherapeutic (no warfarin given x 3 days) CBC: Hgb low but stable at 9.9, Pltc WNL  Goal of  Therapy:  INR 2-3 Monitor platelets by anticoagulation protocol: Yes   Plan:  Hold warfarin today Daily PT/INR Monitor CBC and for s/sx of bleeding   Lindell Spar, PharmD, BCPS Clinical Pharmacist  07/11/2021, 9:35 AM

## 2021-07-11 NOTE — Progress Notes (Signed)
PROGRESS NOTE    Katelyn Jackson  IHW:[id number] DOB: May 09, 1946 DOA: 07/01/2021 PCP: Cassandria Anger, MD    Brief Narrative:  This 75 years old female with PMH significant for permanent A. fib on Coumadin, hypertension, hyperlipidemia, pacemaker in place, history of breast cancer, hypothyroidism, chronic bilateral lymphedema, recurrent falls, morbid obesity, BMI 51, GERD, pulmonary hypertension who needs full assistance for daily life activities, She has home health aide 6 times a week at home,  brought to the ED by her son for the evaluation of recent fall on last Monday.  Patient fell from bed on Monday since then complaining about left shoulder pain,  left ankle pain and bilateral knee pain and unable to walk or stand, go to the bathroom and progressively declining. She was found to have left lower extremity cellulitis.  Patient completed antibiotics.  Cellulitis has resolved.  Patient was given vitamin K due to supratherapeutic INR.  She was also given Lasix for CHF exacerbation.  She was managed with IV antibiotics for cellulitis,  IV Lasix for CHF.  ABG showed CO2 retention-chronic hypercapnia compensated.  Assessment & Plan:   Principal Problem:   Cellulitis of left lower extremity Active Problems:   Essential hypertension   Chronic atrial fibrillation (HCC)   Lymphedema   Anemia of chronic disease   CRF (chronic renal failure), stage 4 (severe) (HCC)   Morbid obesity (HCC)   GERD (gastroesophageal reflux disease)   Hypothyroidism   Supratherapeutic INR   Pulmonary edema   Pressure injury of skin  Cellulitis of left lower extremity/ Chronic bilateral lymphedema: Completed antibiotics.  Erythema and warmth has resolved. Continue adequate pain control.  Recurrent falls / Ambulatory dysfunction / moderate to severe osteoarthritis: Patient had complete skeletal survey.  X-ray of the knees and left ankle: No fracture Right hip: Degenerative disc pain.  Patient has chronic  pain for years. Dr. Para March orthopedics was consulted.  Adequate pain control with oral and IV opiates. Despite the need for bilateral total knee replacements,  she is certainly not a surgical candidate from cardiac, renal and body habitus standpoint. PT and OT recommended  skilled nursing facility. Patient currently medically stable for transfer to SNF  Acute hypoxic respiratory failure could be multifactorial. secondary to fluid overload,  acute on chronic systolic CHF, hypercapnia likely from OSA. Respiratory status much improved.  Patient had adequate diuresis with negative fluid balance. Continue supplemental oxygen to keep saturation above 94%. Diuretics changed to torsemide po 40 mg twice daily.  AKI on CKD stage IIIb: Baseline serum creatinine 1.4-1.6 Creatinine improved with IV Lasix and remained stable.  Essential hypertension: Continue Coreg  Chronic atrial fibrillation: Continue Coreg, heart rate controlled. Coumadin is on hold because of supratherapeutic INR.   INR today is 4.0  Pacemaker in place: Stable.  Hypothyroidism: Continue levothyroxine.  Anemia of chronic disease: Hemoglobin remained stable, no obvious bleeding.  History of breast cancer: Outpatient follow-up.  Right shoulder pain and swelling: Venous duplex ruled out DVT. Right shoulder x-ray and CT scan showed anterior shoulder dislocation. Ortho recommended no acute intervention, seems chronic. Continue doxycycline for cellulitis or right shoulder incision.  DVT prophylaxis: Supratherapeutic INR Code Status: Full code. Family Communication: No family at bed side. Disposition Plan:   Status is: Inpatient  Remains inpatient appropriate because:Inpatient level of care appropriate due to severity of illness  Dispo: The patient is from: Home              Anticipated d/c is to: SNF  Patient currently is not medically stable to d/c.   Difficult to place patient No  Consultants:   None  Procedures:   Antimicrobials:   Anti-infectives (From admission, onward)    Start     Dose/Rate Route Frequency Ordered Stop   07/10/21 1530  doxycycline (VIBRA-TABS) tablet 100 mg        100 mg Oral Every 12 hours 07/10/21 1437     07/05/21 1245  cefadroxil (DURICEF) capsule 500 mg        500 mg Oral 2 times daily 07/05/21 1155 07/07/21 2217   07/03/21 0900  cefTRIAXone (ROCEPHIN) 2 g in sodium chloride 0.9 % 100 mL IVPB  Status:  Discontinued        2 g 200 mL/hr over 30 Minutes Intravenous Every 24 hours 07/02/21 0950 07/05/21 1155   07/02/21 0915  cefTRIAXone (ROCEPHIN) 2 g in sodium chloride 0.9 % 100 mL IVPB        2 g 200 mL/hr over 30 Minutes Intravenous  Once 07/02/21 6073 07/02/21 1033        Subjective: Patient was seen and examined at bedside.  Overnight events noted. She does have chronic bilateral lymphedema.  She still reports having chronic pain. Right arm swelling is improving, has small pea-sized swelling over the incision on the right anterior shoulder.  Objective: Vitals:   07/10/21 1109 07/10/21 2103 07/11/21 0500 07/11/21 0504  BP: 111/61 (!) 120/54  (!) 143/71  Pulse: 61 67  (!) 58  Resp: 18   18  Temp: 99.6 F (37.6 C) 99.5 F (37.5 C)  98.2 F (36.8 C)  TempSrc: Oral Oral  Oral  SpO2: 94% 92%  92%  Weight:   116 kg   Height:        Intake/Output Summary (Last 24 hours) at 07/11/2021 1118 Last data filed at 07/11/2021 0900 Gross per 24 hour  Intake 240 ml  Output 3100 ml  Net -2860 ml   Filed Weights   07/09/21 0500 07/10/21 0623 07/11/21 0500  Weight: 117.1 kg 116.4 kg 116 kg    Examination:  General exam: Appears comfortable, chronically ill looking, not in any distress.   Respiratory system: Clear to auscultation bilaterally, RR 15. Cardiovascular system: S1-S2 heard, irregular rhythm, rate normal, no murmur Gastrointestinal system: Abdomen is soft, nontender, nondistended, BS+ Central nervous system: Alert and oriented x 3.  No focal neurological deficits. Extremities: Bilateral lymphedema noted, right shoulder and arm swelling from dislocation. Right shoulder tenderness , tenderness when trying to lift arm. Skin: No rashes, lesions or ulcers Psychiatry: Judgement and insight appear normal. Mood & affect appropriate.     Data Reviewed: I have personally reviewed following labs and imaging studies  CBC: Recent Labs  Lab 07/07/21 0451 07/08/21 0800 07/09/21 0517 07/10/21 0818 07/11/21 0510  WBC 8.7 7.2 7.4 7.2 6.1  HGB 10.6* 10.0* 9.9* 10.1* 9.9*  HCT 33.5* 30.9* 30.9* 31.8* 31.1*  MCV 92.3 92.0 92.2 94.1 93.1  PLT 182 187 246 248 710   Basic Metabolic Panel: Recent Labs  Lab 07/06/21 0515 07/07/21 0451 07/08/21 0800 07/09/21 0517 07/10/21 0818  NA 138 142 138 135 137  K 3.2* 3.7 4.0 5.0 3.9  CL 97* 99 97* 95* 97*  CO2 33* 35* 35* 30 33*  GLUCOSE 113* 125* 111* 99 96  BUN 35* 36* 36* 37* 33*  CREATININE 1.18* 1.35* 1.27* 1.44* 1.28*  CALCIUM 9.3 9.8 9.7 9.6 10.2  MG  --   --  2.2 2.4  --  PHOS  --   --  2.8 2.9  --    GFR: Estimated Creatinine Clearance: 45 mL/min (A) (by C-G formula based on SCr of 1.28 mg/dL (H)). Liver Function Tests: Recent Labs  Lab 07/05/21 0430 07/06/21 0515  AST 34 49*  ALT 20 27  ALKPHOS 235* 317*  BILITOT 3.1* 3.2*  PROT 6.3* 6.3*  ALBUMIN 2.4* 2.2*   No results for input(s): LIPASE, AMYLASE in the last 168 hours. No results for input(s): AMMONIA in the last 168 hours. Coagulation Profile: Recent Labs  Lab 07/07/21 0451 07/08/21 0800 07/09/21 0517 07/10/21 1134 07/11/21 0510  INR 2.7* 3.5* 4.2* 5.0* 4.0*   Cardiac Enzymes: No results for input(s): CKTOTAL, CKMB, CKMBINDEX, TROPONINI in the last 168 hours. BNP (last 3 results) No results for input(s): PROBNP in the last 8760 hours. HbA1C: No results for input(s): HGBA1C in the last 72 hours. CBG: No results for input(s): GLUCAP in the last 168 hours. Lipid Profile: No results for  input(s): CHOL, HDL, LDLCALC, TRIG, CHOLHDL, LDLDIRECT in the last 72 hours. Thyroid Function Tests: No results for input(s): TSH, T4TOTAL, FREET4, T3FREE, THYROIDAB in the last 72 hours. Anemia Panel: No results for input(s): VITAMINB12, FOLATE, FERRITIN, TIBC, IRON, RETICCTPCT in the last 72 hours. Sepsis Labs: No results for input(s): PROCALCITON, LATICACIDVEN in the last 168 hours.  Recent Results (from the past 240 hour(s))  Urine Culture     Status: Abnormal   Collection Time: 07/01/21  2:39 PM   Specimen: Urine, Clean Catch  Result Value Ref Range Status   Specimen Description   Final    URINE, CLEAN CATCH Performed at [facility], [place] [address] [place], [place] [address]    Special Requests   Final    NONE Performed at [facility], [place] [address] [place], [place] [address]    Culture MULTIPLE SPECIES PRESENT, SUGGEST RECOLLECTION (A)  Final   Report Status 07/02/2021 FINAL  Final  SARS CORONAVIRUS 2 (TAT 6-24 HRS) Nasopharyngeal Nasopharyngeal Swab     Status: None   Collection Time: 07/02/21 11:19 AM   Specimen: Nasopharyngeal Swab  Result Value Ref Range Status   SARS Coronavirus 2 NEGATIVE NEGATIVE Final    Comment: (NOTE) SARS-CoV-2 target nucleic acids are NOT DETECTED.  The SARS-CoV-2 RNA is generally detectable in upper and lower respiratory specimens during the acute phase of infection. Negative results do not preclude SARS-CoV-2 infection, do not rule out co-infections with other pathogens, and should not be used as the sole basis for treatment or other patient management decisions. Negative results must be combined with clinical observations, patient history, and epidemiological information. The expected result is Negative.  Fact Sheet for Patients: SugarRoll.be  Fact Sheet for Healthcare Providers: https://www.woods-mathews.com/  This test is not yet approved or cleared  by the [place] FDA and  has been authorized for detection and/or diagnosis of SARS-CoV-2 by FDA under an Emergency Use Authorization (EUA). This EUA will remain  in effect (meaning this test can be used) for the duration of the COVID-19 declaration under Se ction 564(b)(1) of the Act, 21 U.S.C. section 360bbb-3(b)(1), unless the authorization is terminated or revoked sooner.  Performed at [facility], [place] [address] [place], [place] [address]     Radiology Studies: No results found.  Scheduled Meds:  carvedilol  25 mg Oral BID   cholecalciferol  2,000 Units Oral Daily   doxycycline  100 mg Oral Q12H   exemestane  25  mg Oral QPC breakfast   levothyroxine  50 mcg Oral QAC breakfast   magnesium oxide  400 mg Oral BID   pantoprazole  40 mg Oral Daily   pravastatin  20 mg Oral q1800   torsemide  40 mg Oral BID   vitamin B-12  2,000 mcg Oral Daily   Warfarin - Pharmacist Dosing Inpatient   Does not apply q1600   Continuous Infusions:  sodium chloride 250 mL (07/04/21 0822)     LOS: 9 days    Time spent: 25 mins    Rifky Lapre, MD [facility]   If 7PM-7AM, please contact night-coverage

## 2021-07-11 NOTE — Plan of Care (Signed)
  Problem: Education: Goal: Knowledge of General Education information will improve Description: Including pain rating scale, medication(s)/side effects and non-pharmacologic comfort measures Outcome: Progressing   Problem: Health Behavior/Discharge Planning: Goal: Ability to manage health-related needs will improve Outcome: Progressing   Problem: Clinical Measurements: Goal: Will remain free from infection Outcome: Progressing   Problem: Activity: Goal: Risk for activity intolerance will decrease Outcome: Progressing   Problem: Nutrition: Goal: Adequate nutrition will be maintained Outcome: Progressing   Problem: Elimination: Goal: Will not experience complications related to bowel motility Outcome: Progressing   Problem: Pain Managment: Goal: General experience of comfort will improve Outcome: Progressing   Problem: Safety: Goal: Ability to remain free from injury will improve Outcome: Progressing   Problem: Skin Integrity: Goal: Risk for impaired skin integrity will decrease Outcome: Progressing

## 2021-07-12 DIAGNOSIS — L03116 Cellulitis of left lower limb: Secondary | ICD-10-CM | POA: Diagnosis not present

## 2021-07-12 LAB — CBC
HCT: 30 % — ABNORMAL LOW (ref 36.0–46.0)
Hemoglobin: 9.6 g/dL — ABNORMAL LOW (ref 12.0–15.0)
MCH: 29.9 pg (ref 26.0–34.0)
MCHC: 32 g/dL (ref 30.0–36.0)
MCV: 93.5 fL (ref 80.0–100.0)
Platelets: 245 10*3/uL (ref 150–400)
RBC: 3.21 MIL/uL — ABNORMAL LOW (ref 3.87–5.11)
RDW: 16.8 % — ABNORMAL HIGH (ref 11.5–15.5)
WBC: 6.7 10*3/uL (ref 4.0–10.5)
nRBC: 0 % (ref 0.0–0.2)

## 2021-07-12 LAB — PROTIME-INR
INR: 3 — ABNORMAL HIGH (ref 0.8–1.2)
Prothrombin Time: 31.2 seconds — ABNORMAL HIGH (ref 11.4–15.2)

## 2021-07-12 MED ORDER — WARFARIN SODIUM 2 MG PO TABS
2.0000 mg | ORAL_TABLET | Freq: Once | ORAL | Status: AC
  Administered 2021-07-12: 2 mg via ORAL
  Filled 2021-07-12: qty 1

## 2021-07-12 NOTE — Progress Notes (Signed)
PROGRESS NOTE    Katelyn Jackson  ZOX:[id number] DOB: 1946-09-25 DOA: 07/01/2021 PCP: Cassandria Anger, MD    Brief Narrative:  This 75 years old female with PMH significant for permanent A. fib on Coumadin, hypertension, hyperlipidemia, pacemaker in place, history of breast cancer, hypothyroidism, chronic bilateral lymphedema, recurrent falls, morbid obesity, BMI 51, GERD, pulmonary hypertension who needs full assistance for daily life activities, She has home health aide 6 times a week at home,  brought to the ED by her son for the evaluation of recent fall on last Monday.  Patient fell from bed on Monday since then complaining about left shoulder pain,  left ankle pain and bilateral knee pain and unable to walk or stand, go to the bathroom and progressively declining. She was found to have left lower extremity cellulitis.  Patient completed antibiotics.  Cellulitis has resolved.  Patient was given vitamin K due to supratherapeutic INR.  She was also given Lasix for CHF exacerbation.  She was managed with IV antibiotics for cellulitis,  IV Lasix for CHF.  ABG showed CO2 retention-chronic hypercapnia compensated.  Assessment & Plan:   Principal Problem:   Cellulitis of left lower extremity Active Problems:   Essential hypertension   Chronic atrial fibrillation (HCC)   Lymphedema   Anemia of chronic disease   CRF (chronic renal failure), stage 4 (severe) (HCC)   Morbid obesity (HCC)   GERD (gastroesophageal reflux disease)   Hypothyroidism   Supratherapeutic INR   Pulmonary edema   Pressure injury of skin  Cellulitis of left lower extremity/ Chronic bilateral lymphedema: Completed antibiotics.  Erythema and warmth has resolved. Continue adequate pain control.  Recurrent falls / Ambulatory dysfunction / moderate to severe osteoarthritis: Patient had complete skeletal survey.  X-ray of the knees and left ankle: No fracture Right hip: Degenerative disc pain.  Patient has chronic  pain for years. Dr. Para March orthopedics was consulted.  Adequate pain control with oral and IV opiates. Despite the need for bilateral total knee replacements,  she is certainly not a surgical candidate from cardiac, renal and body habitus standpoint. PT and OT recommended  skilled nursing facility. Patient currently medically stable for transfer to SNF  Acute hypoxic respiratory failure could be multifactorial:  > Resolved. secondary to fluid overload,  acute on chronic systolic CHF, hypercapnia likely from OSA. Respiratory status much improved.  Patient had adequate diuresis with negative fluid balance. Continue supplemental oxygen to keep saturation above 94%. Diuretics changed to torsemide po 40 mg twice daily. She is down to 93% on room air.  AKI on CKD stage IIIb: Baseline serum creatinine 1.4-1.6 Creatinine improved with IV Lasix and remained stable. Avoid nephrotoxic medications.  Essential hypertension: Continue Coreg  Chronic atrial fibrillation: Continue Coreg, heart rate controlled. Coumadin was on hold because of supratherapeutic INR.   INR today is 3.0.  Resume coumadin tonight.  Pacemaker in place: Stable.  Hypothyroidism: Continue levothyroxine.  Anemia of chronic disease: Hemoglobin remained stable, no obvious bleeding.  History of breast cancer: Outpatient follow-up.  Right shoulder pain and swelling: Venous duplex ruled out DVT. Right shoulder x-ray and CT scan showed  chronic anterior shoulder dislocation. Ortho recommended no acute intervention, seems chronic. Continue doxycycline for stitch abscess on right shoulder, improving   DVT prophylaxis: Coumadin Code Status: Full code. Family Communication: No family at bed side. Disposition Plan:   Status is: Inpatient  Remains inpatient appropriate because:Inpatient level of care appropriate due to severity of illness  Dispo: The patient is from:  Home              Anticipated d/c is to: SNF               Patient currently is not medically stable to d/c.   Difficult to place patient No  Consultants:  None  Procedures:   Antimicrobials:   Anti-infectives (From admission, onward)    Start     Dose/Rate Route Frequency Ordered Stop   07/10/21 1530  doxycycline (VIBRA-TABS) tablet 100 mg        100 mg Oral Every 12 hours 07/10/21 1437     07/05/21 1245  cefadroxil (DURICEF) capsule 500 mg        500 mg Oral 2 times daily 07/05/21 1155 07/07/21 2217   07/03/21 0900  cefTRIAXone (ROCEPHIN) 2 g in sodium chloride 0.9 % 100 mL IVPB  Status:  Discontinued        2 g 200 mL/hr over 30 Minutes Intravenous Every 24 hours 07/02/21 0950 07/05/21 1155   07/02/21 0915  cefTRIAXone (ROCEPHIN) 2 g in sodium chloride 0.9 % 100 mL IVPB        2 g 200 mL/hr over 30 Minutes Intravenous  Once 07/02/21 9735 07/02/21 1033        Subjective: Patient was seen and examined at bedside.  Overnight events noted. Patient does have chronic bilateral lymphedema, patient still reports having pain but better controlled. Right arm swelling is improving, pea-sized swelling has significantly improved,  redness has resolved.  Objective: Vitals:   07/11/21 2024 07/12/21 0500 07/12/21 0528 07/12/21 1052  BP: (!) 116/56  130/69 (!) 114/58  Pulse: 63  63 60  Resp: 18  18 20   Temp: 99.8 F (37.7 C)  98.5 F (36.9 C) 98 F (36.7 C)  TempSrc: Oral  Oral   SpO2: 95%  93% 93%  Weight:  113 kg    Height:        Intake/Output Summary (Last 24 hours) at 07/12/2021 1146 Last data filed at 07/12/2021 1119 Gross per 24 hour  Intake 780 ml  Output 2200 ml  Net -1420 ml   Filed Weights   07/10/21 0623 07/11/21 0500 07/12/21 0500  Weight: 116.4 kg 116 kg 113 kg    Examination:  General exam: Appears comfortable, lying flat in bed without any distress. Respiratory system: Clear to auscultation bilaterally , no wheezing cardiovascular system: S1-S2 heard, irregular rhythm, rate normal, murmur++ Gastrointestinal  system: Abdomen is soft, nontender, nondistended, BS+ Central nervous system: Alert and oriented x 3. No focal neurological deficits. Extremities: Bilateral lymphedema noted, right shoulder and arm swelling from dislocation. Right shoulder tenderness , tenderness when trying to lift arm. Skin: No rashes, lesions or ulcers Psychiatry: Judgement and insight appear normal. Mood & affect appropriate.     Data Reviewed: I have personally reviewed following labs and imaging studies  CBC: Recent Labs  Lab 07/08/21 0800 07/09/21 0517 07/10/21 0818 07/11/21 0510 07/12/21 0437  WBC 7.2 7.4 7.2 6.1 6.7  HGB 10.0* 9.9* 10.1* 9.9* 9.6*  HCT 30.9* 30.9* 31.8* 31.1* 30.0*  MCV 92.0 92.2 94.1 93.1 93.5  PLT 187 246 248 251 329   Basic Metabolic Panel: Recent Labs  Lab 07/06/21 0515 07/07/21 0451 07/08/21 0800 07/09/21 0517 07/10/21 0818  NA 138 142 138 135 137  K 3.2* 3.7 4.0 5.0 3.9  CL 97* 99 97* 95* 97*  CO2 33* 35* 35* 30 33*  GLUCOSE 113* 125* 111* 99 96  BUN 35* 36*  36* 37* 33*  CREATININE 1.18* 1.35* 1.27* 1.44* 1.28*  CALCIUM 9.3 9.8 9.7 9.6 10.2  MG  --   --  2.2 2.4  --   PHOS  --   --  2.8 2.9  --    GFR: Estimated Creatinine Clearance: 44.3 mL/min (A) (by C-G formula based on SCr of 1.28 mg/dL (H)). Liver Function Tests: Recent Labs  Lab 07/06/21 0515  AST 49*  ALT 27  ALKPHOS 317*  BILITOT 3.2*  PROT 6.3*  ALBUMIN 2.2*   No results for input(s): LIPASE, AMYLASE in the last 168 hours. No results for input(s): AMMONIA in the last 168 hours. Coagulation Profile: Recent Labs  Lab 07/08/21 0800 07/09/21 0517 07/10/21 1134 07/11/21 0510 07/12/21 0437  INR 3.5* 4.2* 5.0* 4.0* 3.0*   Cardiac Enzymes: No results for input(s): CKTOTAL, CKMB, CKMBINDEX, TROPONINI in the last 168 hours. BNP (last 3 results) No results for input(s): PROBNP in the last 8760 hours. HbA1C: No results for input(s): HGBA1C in the last 72 hours. CBG: No results for input(s):  GLUCAP in the last 168 hours. Lipid Profile: No results for input(s): CHOL, HDL, LDLCALC, TRIG, CHOLHDL, LDLDIRECT in the last 72 hours. Thyroid Function Tests: No results for input(s): TSH, T4TOTAL, FREET4, T3FREE, THYROIDAB in the last 72 hours. Anemia Panel: No results for input(s): VITAMINB12, FOLATE, FERRITIN, TIBC, IRON, RETICCTPCT in the last 72 hours. Sepsis Labs: No results for input(s): PROCALCITON, LATICACIDVEN in the last 168 hours.  No results found for this or any previous visit (from the past 240 hour(s)).   Radiology Studies: No results found.  Scheduled Meds:  carvedilol  25 mg Oral BID   cholecalciferol  2,000 Units Oral Daily   doxycycline  100 mg Oral Q12H   exemestane  25 mg Oral QPC breakfast   levothyroxine  50 mcg Oral QAC breakfast   magnesium oxide  400 mg Oral BID   pantoprazole  40 mg Oral Daily   pravastatin  20 mg Oral q1800   torsemide  40 mg Oral BID   vitamin B-12  2,000 mcg Oral Daily   warfarin  2 mg Oral ONCE-1600   Warfarin - Pharmacist Dosing Inpatient   Does not apply q1600   Continuous Infusions:  sodium chloride 250 mL (07/04/21 0822)     LOS: 10 days    Time spent: 25 mins    Setsuko Robins, MD [facility]   If 7PM-7AM, please contact night-coverage

## 2021-07-12 NOTE — Progress Notes (Signed)
[place] for  warfarin Indication:  hx atrial fibrillation  Allergies  Allergen Reactions   Chlorhexidine Gluconate Hives   Penicillins Itching    Has patient had a PCN reaction causing immediate rash, facial/tongue/throat swelling, SOB or lightheadedness with hypotension: no Has patient had a PCN reaction causing severe rash involving mucus membranes or skin necrosis: No Has patient had a PCN reaction that required hospitalization: No Has patient had a PCN reaction occurring within the last 10 years: No If all of the above answers are "NO", then may proceed with Cephalosporin use.  Tolerated Cephalosporin Date: 03/17/21.    Patient Measurements: Height: 5\' 1"  (154.9 cm) Weight: 113 kg (249 lb 1.9 oz) IBW/kg (Calculated) : 47.8   Vital Signs: Temp: 98.5 F (36.9 C) (09/05 0528) Temp Source: Oral (09/05 0528) BP: 130/69 (09/05 0528) Pulse Rate: 63 (09/05 0528)  Labs: Recent Labs    07/10/21 0818 07/10/21 1134 07/11/21 0510 07/12/21 0437  HGB 10.1*  --  9.9* 9.6*  HCT 31.8*  --  31.1* 30.0*  PLT 248  --  251 245  LABPROT  --  46.4* 38.7* 31.2*  INR  --  5.0* 4.0* 3.0*  CREATININE 1.28*  --   --   --     Estimated Creatinine Clearance: 44.3 mL/min (A) (by C-G formula based on SCr of 1.28 mg/dL (H)).  Medications:  - on warfarin 2.5mg  daily except 5mg  on Tue PTA (last dose taken on 8/24)  Assessment: Patient is a 75 y.o F with hx afib on warfarin PTA, presented to the ED on 8/25 with failure to thrive, She also reported of a fall one week ago and reports poor oral intake afterwards. Head CT was negative for any acute abnormality.  Pharmacy has been consulted to dose warfarin while she's hospitalized.  - Vitamin K 2.5mg  PO x 1 given on 8/26 and 8/29 for elevated INR - Warfarin resumed 8/30  Today, 07/12/2021: INR therapeutic at upper end of goal range (no warfarin given x 4 days) CBC: Hgb low but stable at 9.6, Pltc  WNL Eating 25-50% of meals for most part  Goal of Therapy:  INR 2-3 Monitor platelets by anticoagulation protocol: Yes   Plan:  Warfarin 2mg  today as INR expected to continue to drop Daily PT/INR Monitor CBC and for s/sx of bleeding   Adrian Saran, PharmD, BCPS Secure Chat if ?s 07/12/2021 10:05 AM

## 2021-07-12 NOTE — Progress Notes (Signed)
Physical Therapy Treatment Patient Details Name: Katelyn Jackson MRN: [id number] DOB: [birth year] Today's Date: 07/12/2021    History of Present Illness Katelyn Jackson is a 75 y.o. female brought by son to the ER for further evaluation of recent fall. Patient's son at the bedside is the historian:.  He tells me that patient fell from the bed on Monday and since then she is complaining of left shoulder pain, left ankle pain and bilateral knee pain, she is unable to walk, stand, go to bathroom and progressively declining. PMH: permanent A. fib, HTN, hyperlipidemia, s/p pacemaker placement, breast cancer, hypothyroidism, chronic bilateral lymphedema, recurrent falls, obesity, GERD, pulmonary hypertension    PT Comments    General Comments: AxO x 2 "does not know the day and  time of day.  Pt stated she feels "very weak" and "is in a lot of pain".  Asked for Hot Tea with sugar.  Used I Press photographer to communicate. Attempted OOB to recliner was unsuccessful.  . General bed mobility comments: Total Assist + 2 pt 0% crying in pain.  Attempted to place Maxi Move Pad under pt but did not complete due to pt's tolerance.  + 2 Total Assist to scoot to [facility] and position to comfort.  Unable to tolerate OOB this session. Pt will need ST Rehab at SNF prior to returning home with Son.   Follow Up Recommendations  SNF     Equipment Recommendations       Recommendations for Other Services       Precautions / Restrictions Precautions Precautions: Fall Precaution Comments: leg pain, right shoulder TSA now painful Restrictions Other Position/Activity Restrictions: speaks russian    Mobility  Bed Mobility Overal bed mobility: Needs Assistance Bed Mobility: Rolling Rolling: Total assist         General bed mobility comments: Total Assist + 2 pt 0% crying in pain.  Attempted to place Maxi Move Pad under pt but did not complete due to pt's tolerance.  + 2 Total Assist to scoot to [facility] and position  to comfort.  Unable to tolerate OOB this session.    Transfers                    Ambulation/Gait                 Stairs             Wheelchair Mobility    Modified Rankin (Stroke Patients Only)       Balance                                            Cognition Arousal/Alertness: Awake/alert Behavior During Therapy: WFL for tasks assessed/performed;Anxious Overall Cognitive Status: Within Functional Limits for tasks assessed                                 General Comments: AxO x 2 "does not know the day and  time of day.  Pt stated she feels "very weak" and "is in a lot of pain".  Asked for Hot Tea with sugar.  Used I Press photographer to communicate.      Exercises      General Comments        Pertinent Vitals/Pain Pain Assessment: Faces Faces Pain Scale: Hurts even more Pain Location:  B heels, B knees and RIGHT shoulder Pain Descriptors / Indicators: Grimacing;Moaning;Guarding Pain Intervention(s): Monitored during session;Repositioned    Home Living                      Prior Function            PT Goals (current goals can now be found in the care plan section) Progress towards PT goals: Progressing toward goals    Frequency    Min 2X/week      PT Plan Current plan remains appropriate    Co-evaluation              AM-PAC PT "6 Clicks" Mobility   Outcome Measure  Help needed turning from your back to your side while in a flat bed without using bedrails?: Total Help needed moving from lying on your back to sitting on the side of a flat bed without using bedrails?: Total Help needed moving to and from a bed to a chair (including a wheelchair)?: Total Help needed standing up from a chair using your arms (e.g., wheelchair or bedside chair)?: Total Help needed to walk in hospital room?: Total   6 Click Score: 5    End of Session   Activity Tolerance: Patient limited by  fatigue;Patient limited by pain Patient left: in bed;with call bell/phone within reach Nurse Communication: Mobility status PT Visit Diagnosis: Other abnormalities of gait and mobility (R26.89);Muscle weakness (generalized) (M62.81)     Time: 1135-1150 PT Time Calculation (min) (ACUTE ONLY): 15 min  Charges:  $Therapeutic Activity: 8-22 mins                     {Waneda Klammer  PTA Acute  Rehabilitation Services Pager      [phone] Office      [phone]

## 2021-07-12 NOTE — Plan of Care (Signed)
  Problem: Pain Managment: Goal: General experience of comfort will improve Outcome: Progressing  Problem: Clinical Measurements: Goal: Will remain free from infection Outcome: Progressing   Problem: Activity: Goal: Risk for activity intolerance will decrease Outcome: Progressing   Problem: Nutrition: Goal: Adequate nutrition will be maintained Outcome: Progressing   Problem: Elimination: Goal: Will not experience complications related to bowel motility Outcome: Progressing

## 2021-07-13 DIAGNOSIS — L03116 Cellulitis of left lower limb: Secondary | ICD-10-CM | POA: Diagnosis not present

## 2021-07-13 LAB — CBC
HCT: 29.9 % — ABNORMAL LOW (ref 36.0–46.0)
Hemoglobin: 9.4 g/dL — ABNORMAL LOW (ref 12.0–15.0)
MCH: 29.4 pg (ref 26.0–34.0)
MCHC: 31.4 g/dL (ref 30.0–36.0)
MCV: 93.4 fL (ref 80.0–100.0)
Platelets: 239 10*3/uL (ref 150–400)
RBC: 3.2 MIL/uL — ABNORMAL LOW (ref 3.87–5.11)
RDW: 16.6 % — ABNORMAL HIGH (ref 11.5–15.5)
WBC: 5.5 10*3/uL (ref 4.0–10.5)
nRBC: 0 % (ref 0.0–0.2)

## 2021-07-13 LAB — BASIC METABOLIC PANEL
Anion gap: 5 (ref 5–15)
BUN: 32 mg/dL — ABNORMAL HIGH (ref 8–23)
CO2: 37 mmol/L — ABNORMAL HIGH (ref 22–32)
Calcium: 9.9 mg/dL (ref 8.9–10.3)
Chloride: 94 mmol/L — ABNORMAL LOW (ref 98–111)
Creatinine, Ser: 1.42 mg/dL — ABNORMAL HIGH (ref 0.44–1.00)
GFR, Estimated: 39 mL/min — ABNORMAL LOW (ref >60)
Glucose, Bld: 97 mg/dL (ref 70–99)
Potassium: 3.2 mmol/L — ABNORMAL LOW (ref 3.5–5.1)
Sodium: 136 mmol/L (ref 135–145)

## 2021-07-13 LAB — PHOSPHORUS: Phosphorus: 3.2 mg/dL (ref 2.5–4.6)

## 2021-07-13 LAB — MAGNESIUM: Magnesium: 2.4 mg/dL (ref 1.7–2.4)

## 2021-07-13 LAB — GLUCOSE, CAPILLARY: Glucose-Capillary: 100 mg/dL — ABNORMAL HIGH (ref 70–99)

## 2021-07-13 LAB — RESP PANEL BY RT-PCR (FLU A&B, COVID) ARPGX2
Influenza A by PCR: NEGATIVE
Influenza B by PCR: NEGATIVE
SARS Coronavirus 2 by RT PCR: NEGATIVE

## 2021-07-13 LAB — PROTIME-INR
INR: 3.4 — ABNORMAL HIGH (ref 0.8–1.2)
Prothrombin Time: 34.1 seconds — ABNORMAL HIGH (ref 11.4–15.2)

## 2021-07-13 MED ORDER — POTASSIUM CHLORIDE 20 MEQ PO PACK
40.0000 meq | PACK | Freq: Once | ORAL | Status: AC
  Administered 2021-07-13: 40 meq via ORAL
  Filled 2021-07-13: qty 2

## 2021-07-13 NOTE — TOC Progression Note (Signed)
Transition of Care [facility]) - Progression Note    Patient Details  Name: Katelyn Jackson MRN: [id number] Date of Birth: [birth year]  [facility] CM/SW Contact  Deardra Hinkley, Juliann Pulse, RN Phone Number: 07/13/2021, 4:02 PM  Clinical Narrative: [facility] unable to accept patient since [facility] beds are full per prior note could accept on Tuesday. Provided son [name] with bed offers again-await choice,then will start auth.      Expected Discharge Plan: Skilled Nursing Facility Barriers to Discharge: Continued Medical Work up  Expected Discharge Plan and Services Expected Discharge Plan: [place]                                               Social Determinants of Health (SDOH) Interventions    Readmission Risk Interventions Readmission Risk Prevention Plan 07/06/2021 03/19/2021  Transportation Screening Complete Complete  HRI or [place] - Complete  Social Work Consult for [place] Planning/Counseling - Complete  Palliative Care Screening - Not Applicable  Medication Review Press photographer) - Complete  HRI or Home Care Consult Complete -  SW Recovery Care/Counseling Consult Complete -  Palliative Care Screening Not Applicable -  Skilled Nursing Facility Complete -  Some recent data might be hidden

## 2021-07-13 NOTE — Care Management Important Message (Signed)
Important Message  Patient Details IM Letter placed in Patient's room. Name: Katelyn Jackson MRN: [id number] Date of Birth: 21-Jan-1946   Medicare Important Message Given:  Yes     Kerin Salen 07/13/2021, 1:14 PM

## 2021-07-13 NOTE — Progress Notes (Signed)
[place] for  warfarin Indication:  hx atrial fibrillation  Allergies  Allergen Reactions   Chlorhexidine Gluconate Hives   Penicillins Itching    Has patient had a PCN reaction causing immediate rash, facial/tongue/throat swelling, SOB or lightheadedness with hypotension: no Has patient had a PCN reaction causing severe rash involving mucus membranes or skin necrosis: No Has patient had a PCN reaction that required hospitalization: No Has patient had a PCN reaction occurring within the last 10 years: No If all of the above answers are "NO", then may proceed with Cephalosporin use.  Tolerated Cephalosporin Date: 03/17/21.    Patient Measurements: Height: 5\' 1"  (154.9 cm) Weight: 111.7 kg (246 lb 4.1 oz) IBW/kg (Calculated) : 47.8   Vital Signs: Temp: 98.7 F (37.1 C) (09/06 0542) Temp Source: Oral (09/06 0542) BP: 112/52 (09/06 0542) Pulse Rate: 60 (09/06 0542)  Labs: Recent Labs    07/11/21 0510 07/12/21 0437 07/13/21 0421  HGB 9.9* 9.6* 9.4*  HCT 31.1* 30.0* 29.9*  PLT 251 245 239  LABPROT 38.7* 31.2* 34.1*  INR 4.0* 3.0* 3.4*  CREATININE  --   --  1.42*    Estimated Creatinine Clearance: 39.7 mL/min (A) (by C-G formula based on SCr of 1.42 mg/dL (H)).  Medications:  - on warfarin 2.5mg  daily except 5mg  on Tue PTA (last dose taken on 8/24)  Assessment: Patient is a 75 y.o F with hx afib on warfarin PTA, presented to the ED on 8/25 with failure to thrive, She also reported of a fall one week ago and reports poor oral intake afterwards. Head CT was negative for any acute abnormality.  Pharmacy has been consulted to dose warfarin while she's hospitalized.  - Vitamin K 2.5mg  PO x 1 given on 8/26 and 8/29 for elevated INR - Warfarin resumed 8/30  Today, 07/13/2021: INR now SUPRAtherapeutic again (no warfarin given x 4 days then reduced dose of 2mg  given yesterday) CBC: Hgb low but stable at 9.4, Pltc WNL Eating 25-50% of  meals for most part  Goal of Therapy:  INR 2-3 Monitor platelets by anticoagulation protocol: Yes   Plan:  No warfarin today due to INR > 3 Daily PT/INR Monitor CBC and for s/sx of bleeding   Adrian Saran, PharmD, BCPS Secure Chat if ?s 07/13/2021 8:59 AM

## 2021-07-13 NOTE — Progress Notes (Signed)
PROGRESS NOTE    Katelyn Jackson  FBP:[id number] DOB: 1946/09/29 DOA: 07/01/2021 PCP: Cassandria Anger, MD    Brief Narrative:  This 75 years old female with PMH significant for permanent A. fib on Coumadin, hypertension, hyperlipidemia, pacemaker in place, history of breast cancer, hypothyroidism, chronic bilateral lymphedema, recurrent falls, morbid obesity, BMI 51, GERD, pulmonary hypertension who needs full assistance for daily life activities, She has home health aide 6 times a week at home,  brought to the ED by her son for the evaluation of recent fall on last Monday.  Patient fell from bed on Monday since then complaining about left shoulder pain,  left ankle pain and bilateral knee pain and unable to walk or stand, go to the bathroom and progressively declining. She was found to have left lower extremity cellulitis.  Patient completed antibiotics.  Cellulitis has resolved.  Patient was given vitamin K due to supratherapeutic INR.  She was also given Lasix for CHF exacerbation.  She was managed with IV antibiotics for cellulitis,  IV Lasix for CHF.  ABG showed CO2 retention-chronic hypercapnia compensated.  Assessment & Plan:   Principal Problem:   Cellulitis of left lower extremity Active Problems:   Essential hypertension   Chronic atrial fibrillation (HCC)   Lymphedema   Anemia of chronic disease   CRF (chronic renal failure), stage 4 (severe) (HCC)   Morbid obesity (HCC)   GERD (gastroesophageal reflux disease)   Hypothyroidism   Supratherapeutic INR   Pulmonary edema   Pressure injury of skin  Cellulitis of left lower extremity/ Chronic bilateral lymphedema: Completed antibiotics.  Erythema and warmth has resolved. Continue adequate pain control.  Recurrent falls / Ambulatory dysfunction / moderate to severe osteoarthritis: Patient had complete skeletal survey.  X-ray of the knees and left ankle: No fracture Right hip: Degenerative disc pain.  Patient has chronic  pain for years. Dr. Para March orthopedics was consulted.  Adequate pain control with oral and IV opiates. Despite the need for bilateral total knee replacements,  She is certainly not a surgical candidate from cardiac, renal and body habitus standpoint. PT and OT recommended  skilled nursing facility. Patient currently medically stable for transfer to SNF  Acute hypoxic respiratory failure could be multifactorial:  > Resolved. secondary to fluid overload,  acute on chronic systolic CHF, hypercapnia likely from OSA. Respiratory status much improved.  Patient had adequate diuresis with negative fluid balance. Continue supplemental oxygen to keep saturation above 94%. Diuretics changed to torsemide po 40 mg twice daily. She is now 93% on room air.  AKI on CKD stage IIIb: Baseline serum creatinine 1.4-1.6 Creatinine improved with IV Lasix and remained stable. Avoid nephrotoxic medications.  Essential hypertension: Continue Coreg  Chronic atrial fibrillation: Continue Coreg, heart rate controlled. Coumadin was on hold because of supratherapeutic INR.   INR today is 3.4.  Hold coumadin tonight.  Pacemaker in place: Stable.  Hypothyroidism: Continue levothyroxine.  Anemia of chronic disease: Hemoglobin remained stable, no obvious bleeding.  History of breast cancer: Outpatient follow-up.  Right shoulder pain and swelling: Venous duplex ruled out DVT. Right shoulder x-ray and CT scan showed  chronic anterior shoulder dislocation. Ortho recommended no acute intervention, seems chronic. Continue doxycycline for stitch abscess on right shoulder, improving   DVT prophylaxis: High INR Code Status: Full code. Family Communication: No family at bed side. Disposition Plan:   Status is: Inpatient  Remains inpatient appropriate because:Inpatient level of care appropriate due to severity of illness  Dispo: The patient is from:  Home              Anticipated d/c is to: SNF               Patient currently is medically stable for discharge.   Difficult to place patient No  Consultants:  None  Procedures:   Antimicrobials:   Anti-infectives (From admission, onward)    Start     Dose/Rate Route Frequency Ordered Stop   07/10/21 1530  doxycycline (VIBRA-TABS) tablet 100 mg        100 mg Oral Every 12 hours 07/10/21 1437     07/05/21 1245  cefadroxil (DURICEF) capsule 500 mg        500 mg Oral 2 times daily 07/05/21 1155 07/07/21 2217   07/03/21 0900  cefTRIAXone (ROCEPHIN) 2 g in sodium chloride 0.9 % 100 mL IVPB  Status:  Discontinued        2 g 200 mL/hr over 30 Minutes Intravenous Every 24 hours 07/02/21 0950 07/05/21 1155   07/02/21 0915  cefTRIAXone (ROCEPHIN) 2 g in sodium chloride 0.9 % 100 mL IVPB        2 g 200 mL/hr over 30 Minutes Intravenous  Once 07/02/21 7001 07/02/21 1033        Subjective: Patient was seen and examined at bedside.  Overnight events noted. Patient does have chronic bilateral lymphedema.  Cellulitis has resolved.   Right arm swelling is improving, pea-sized swelling has significantly improved,  redness has Improved.  Objective: Vitals:   07/13/21 0542 07/13/21 0542 07/13/21 0935 07/13/21 1113  BP:  (!) 112/52 116/64 (!) 115/54  Pulse:  60  (!) 59  Resp:  18  20  Temp:  98.7 F (37.1 C)  99.2 F (37.3 C)  TempSrc:  Oral    SpO2:  94%  95%  Weight: 111.7 kg     Height:        Intake/Output Summary (Last 24 hours) at 07/13/2021 1449 Last data filed at 07/13/2021 1341 Gross per 24 hour  Intake 480 ml  Output 2350 ml  Net -1870 ml   Filed Weights   07/11/21 0500 07/12/21 0500 07/13/21 0542  Weight: 116 kg 113 kg 111.7 kg    Examination:  General exam: Appears comfortable, lying flat in bed without any distress. Respiratory system: Clear to auscultation bilaterally , no wheezing cardiovascular system: S1-S2 heard, irregular rhythm, rate normal, murmur++ Gastrointestinal system: Abdomen is soft, nontender, nondistended,  BS+ Central nervous system: Alert and oriented x 2. No focal neurological deficits. Extremities: Bilateral lymphedema noted, right shoulder and arm swelling from dislocation. Right shoulder tenderness , tenderness when trying to lift arm. Pea sized swelling improved. Skin: No rashes, lesions or ulcers Psychiatry: Judgement and insight appear normal. Mood & affect appropriate.     Data Reviewed: I have personally reviewed following labs and imaging studies  CBC: Recent Labs  Lab 07/09/21 0517 07/10/21 0818 07/11/21 0510 07/12/21 0437 07/13/21 0421  WBC 7.4 7.2 6.1 6.7 5.5  HGB 9.9* 10.1* 9.9* 9.6* 9.4*  HCT 30.9* 31.8* 31.1* 30.0* 29.9*  MCV 92.2 94.1 93.1 93.5 93.4  PLT 246 248 251 245 749   Basic Metabolic Panel: Recent Labs  Lab 07/07/21 0451 07/08/21 0800 07/09/21 0517 07/10/21 0818 07/13/21 0421  NA 142 138 135 137 136  K 3.7 4.0 5.0 3.9 3.2*  CL 99 97* 95* 97* 94*  CO2 35* 35* 30 33* 37*  GLUCOSE 125* 111* 99 96 97  BUN 36* 36* 37*  33* 32*  CREATININE 1.35* 1.27* 1.44* 1.28* 1.42*  CALCIUM 9.8 9.7 9.6 10.2 9.9  MG  --  2.2 2.4  --  2.4  PHOS  --  2.8 2.9  --  3.2   GFR: Estimated Creatinine Clearance: 39.7 mL/min (A) (by C-G formula based on SCr of 1.42 mg/dL (H)). Liver Function Tests: No results for input(s): AST, ALT, ALKPHOS, BILITOT, PROT, ALBUMIN in the last 168 hours.  No results for input(s): LIPASE, AMYLASE in the last 168 hours. No results for input(s): AMMONIA in the last 168 hours. Coagulation Profile: Recent Labs  Lab 07/09/21 0517 07/10/21 1134 07/11/21 0510 07/12/21 0437 07/13/21 0421  INR 4.2* 5.0* 4.0* 3.0* 3.4*   Cardiac Enzymes: No results for input(s): CKTOTAL, CKMB, CKMBINDEX, TROPONINI in the last 168 hours. BNP (last 3 results) No results for input(s): PROBNP in the last 8760 hours. HbA1C: No results for input(s): HGBA1C in the last 72 hours. CBG: Recent Labs  Lab 07/13/21 0202  GLUCAP 100*   Lipid Profile: No  results for input(s): CHOL, HDL, LDLCALC, TRIG, CHOLHDL, LDLDIRECT in the last 72 hours. Thyroid Function Tests: No results for input(s): TSH, T4TOTAL, FREET4, T3FREE, THYROIDAB in the last 72 hours. Anemia Panel: No results for input(s): VITAMINB12, FOLATE, FERRITIN, TIBC, IRON, RETICCTPCT in the last 72 hours. Sepsis Labs: No results for input(s): PROCALCITON, LATICACIDVEN in the last 168 hours.  No results found for this or any previous visit (from the past 240 hour(s)).   Radiology Studies: No results found.  Scheduled Meds:  carvedilol  25 mg Oral BID   cholecalciferol  2,000 Units Oral Daily   doxycycline  100 mg Oral Q12H   exemestane  25 mg Oral QPC breakfast   levothyroxine  50 mcg Oral QAC breakfast   magnesium oxide  400 mg Oral BID   pantoprazole  40 mg Oral Daily   pravastatin  20 mg Oral q1800   torsemide  40 mg Oral BID   vitamin B-12  2,000 mcg Oral Daily   Warfarin - Pharmacist Dosing Inpatient   Does not apply q1600   Continuous Infusions:  sodium chloride 250 mL (07/04/21 0822)     LOS: 11 days    Time spent: 25 mins    Takera Rayl, MD [facility]   If 7PM-7AM, please contact night-coverage

## 2021-07-14 DIAGNOSIS — L03116 Cellulitis of left lower limb: Secondary | ICD-10-CM | POA: Diagnosis not present

## 2021-07-14 LAB — CBC
HCT: 29.4 % — ABNORMAL LOW (ref 36.0–46.0)
Hemoglobin: 9.3 g/dL — ABNORMAL LOW (ref 12.0–15.0)
MCH: 29.7 pg (ref 26.0–34.0)
MCHC: 31.6 g/dL (ref 30.0–36.0)
MCV: 93.9 fL (ref 80.0–100.0)
Platelets: 221 10*3/uL (ref 150–400)
RBC: 3.13 MIL/uL — ABNORMAL LOW (ref 3.87–5.11)
RDW: 16.8 % — ABNORMAL HIGH (ref 11.5–15.5)
WBC: 5.9 10*3/uL (ref 4.0–10.5)
nRBC: 0 % (ref 0.0–0.2)

## 2021-07-14 LAB — PROTIME-INR
INR: 2.8 — ABNORMAL HIGH (ref 0.8–1.2)
Prothrombin Time: 29.7 seconds — ABNORMAL HIGH (ref 11.4–15.2)

## 2021-07-14 LAB — BASIC METABOLIC PANEL
Anion gap: 6 (ref 5–15)
BUN: 33 mg/dL — ABNORMAL HIGH (ref 8–23)
CO2: 36 mmol/L — ABNORMAL HIGH (ref 22–32)
Calcium: 10.2 mg/dL (ref 8.9–10.3)
Chloride: 94 mmol/L — ABNORMAL LOW (ref 98–111)
Creatinine, Ser: 1.51 mg/dL — ABNORMAL HIGH (ref 0.44–1.00)
GFR, Estimated: 36 mL/min — ABNORMAL LOW (ref >60)
Glucose, Bld: 105 mg/dL — ABNORMAL HIGH (ref 70–99)
Potassium: 3.5 mmol/L (ref 3.5–5.1)
Sodium: 136 mmol/L (ref 135–145)

## 2021-07-14 LAB — BLOOD GAS, ARTERIAL
Acid-Base Excess: 12.5 mmol/L — ABNORMAL HIGH (ref 0.0–2.0)
Bicarbonate: 37 mmol/L — ABNORMAL HIGH (ref 20.0–28.0)
Drawn by: 331471
FIO2: 21
O2 Saturation: 93.6 %
Patient temperature: 98.6
pCO2 arterial: 47.8 mmHg (ref 32.0–48.0)
pH, Arterial: 7.501 — ABNORMAL HIGH (ref 7.350–7.450)
pO2, Arterial: 63.6 mmHg — ABNORMAL LOW (ref 83.0–108.0)

## 2021-07-14 MED ORDER — CARVEDILOL 12.5 MG PO TABS
12.5000 mg | ORAL_TABLET | Freq: Two times a day (BID) | ORAL | Status: DC
  Administered 2021-07-14 – 2021-07-15 (×2): 12.5 mg via ORAL
  Filled 2021-07-14 (×2): qty 1

## 2021-07-14 MED ORDER — OXYCODONE HCL 5 MG PO TABS
5.0000 mg | ORAL_TABLET | Freq: Four times a day (QID) | ORAL | Status: DC | PRN
  Administered 2021-07-14 – 2021-07-22 (×14): 5 mg via ORAL
  Filled 2021-07-14 (×15): qty 1

## 2021-07-14 MED ORDER — WARFARIN SODIUM 2.5 MG PO TABS
2.5000 mg | ORAL_TABLET | Freq: Once | ORAL | Status: AC
  Administered 2021-07-14: 2.5 mg via ORAL
  Filled 2021-07-14: qty 1

## 2021-07-14 NOTE — Progress Notes (Signed)
Follow-up page sent to orthopedic McBane, PA to come and assess patients right shoulder at bedside due to increased redness, size, and tenderness.

## 2021-07-14 NOTE — Progress Notes (Signed)
PROGRESS NOTE    Katelyn Jackson  HBZ:[id number] DOB: 1946/06/17 DOA: 07/01/2021 PCP: Cassandria Anger, MD   Brief Narrative: 75 years old female with PMH significant for permanent A. fib on Coumadin, hypertension, hyperlipidemia, pacemaker in place, history of breast cancer, hypothyroidism, chronic bilateral lymphedema, recurrent falls, morbid obesity, BMI 51, GERD, pulmonary hypertension who needs full assistance for daily life activities, She has home health aide 6 times a week at home,  brought to the ED by her son for the evaluation of recent fall on last Monday.  Patient fell from bed on Monday since then complaining about left shoulder pain,  left ankle pain and bilateral knee pain and unable to walk or stand, go to the bathroom and progressively declining. She was found to have left lower extremity cellulitis.  Patient completed antibiotics.  Cellulitis has resolved.  Patient was given vitamin K due to supratherapeutic INR.  She was also given Lasix for CHF exacerbation.  She was managed with IV antibiotics for cellulitis,  IV Lasix for CHF.  ABG showed CO2 retention-chronic hypercapnia compensated.  Assessment & Plan:   Principal Problem:   Cellulitis of left lower extremity Active Problems:   Essential hypertension   Chronic atrial fibrillation (HCC)   Lymphedema   Anemia of chronic disease   CRF (chronic renal failure), stage 4 (severe) (HCC)   Morbid obesity (HCC)   GERD (gastroesophageal reflux disease)   Hypothyroidism   Supratherapeutic INR   Pulmonary edema   Pressure injury of skin  Cellulitis of left lower extremity/ Chronic bilateral lymphedema: Completed antibiotics.  Erythema and warmth has resolved. Continue adequate pain control.   Recurrent falls / Ambulatory dysfunction / moderate to severe osteoarthritis: Patient had complete skeletal survey.  X-ray of the knees and left ankle: No fracture Right hip: Degenerative disc pain.  Patient has chronic pain  for years. Dr. Para March orthopedics was consulted.  Adequate pain control with oral and IV opiates. Despite the need for bilateral total knee replacements,  She is certainly not a surgical candidate from cardiac, renal and body habitus standpoint. PT and OT recommended  skilled nursing facility. Patient currently medically stable for transfer to SNF   Acute hypoxic respiratory failure could be multifactorial:  > Resolved. secondary to fluid overload,  acute on chronic systolic CHF, hypercapnia likely from OSA. Respiratory status much improved.  Patient had adequate diuresis with negative fluid balance. Continue supplemental oxygen to keep saturation above 94%. Diuretics changed to torsemide po 40 mg twice daily. She is now 93% on room air.   AKI on CKD stage IIIb: Baseline serum creatinine 1.4-1.6 Creatinine improved with IV Lasix and remained stable. Avoid nephrotoxic medications.   Essential hypertension: Blood pressure 121/62 decrease the dose of Coreg   Chronic atrial fibrillation: Continue Coreg, heart rate controlled. Coumadin was on hold because of supratherapeutic INR.   INR today is 2.8 Restart Coumadin   Pacemaker in place: Stable.  Hypothyroidism: TSH 2.7 on 07/02/2021 Continue levothyroxine.   Anemia of chronic disease: Hemoglobin remained stable, no obvious bleeding.   History of breast cancer: Outpatient follow-up.   Right shoulder pain and swelling: Venous duplex ruled out DVT. Right shoulder x-ray and CT scan showed  chronic anterior shoulder dislocation. Ortho recommended no acute intervention, seems chronic. Continue doxycycline for stitch abscess on right shoulder,  Messaged Ortho to see patient as stitch abscess site is becoming more erythematous and tender.  Encephalopathy/changes in mental status family concerned this is not her baseline.  Will check  ABG since she was admitted with hypercapnia.  Possible sleep apnea not diagnosed.   Pressure Injury  07/03/21 Thigh Posterior;Right Stage 3 -  Full thickness tissue loss. Subcutaneous fat may be visible but bone, tendon or muscle are NOT exposed. (Active)  07/03/21 0453  Location: Thigh  Location Orientation: Posterior;Right  Staging: Stage 3 -  Full thickness tissue loss. Subcutaneous fat may be visible but bone, tendon or muscle are NOT exposed.  Wound Description (Comments):   Present on Admission: Yes     Pressure Injury 07/03/21 Sacrum Stage 2 -  Partial thickness loss of dermis presenting as a shallow open injury with a red, pink wound bed without slough. (Active)  07/03/21 2105  Location: Sacrum  Location Orientation:   Staging: Stage 2 -  Partial thickness loss of dermis presenting as a shallow open injury with a red, pink wound bed without slough.  Wound Description (Comments):   Present on Admission: Yes    Estimated body mass index is 46.53 kg/m as calculated from the following:   Height as of this encounter: 5\' 1"  (1.549 m).   Weight as of this encounter: 111.7 kg.  DVT prophylaxis: Coumadin  code Status: Full code Family Communication: None at bedside  disposition Plan:  Status is: Inpatient  Remains inpatient appropriate because:Hemodynamically unstable  Dispo: The patient is from: Home              Anticipated d/c is to: SNF              Patient currently is not medically stable to d/c.   Difficult to place patient No       Consultants:  Ortho  Procedures: None Antimicrobials: Doxycycline  Subjective: She is resting in bed.  Staff was concerned if the stitch abscess in the right shoulder area is getting more erythematous edematous and painful with very limited movement to the right shoulder.  Family is also concerned that she is more confused than her baseline.  Objective: Vitals:   07/13/21 2141 07/14/21 0424 07/14/21 0948 07/14/21 1210  BP: 140/70 (!) 113/51 (!) 133/55 121/62  Pulse: 72 (!) 59 63 60  Resp: 18 16  20   Temp: 97.9 F (36.6 C) 98.3  F (36.8 C)  98.6 F (37 C)  TempSrc: Oral Oral  Oral  SpO2: 91% 94%  96%  Weight:      Height:        Intake/Output Summary (Last 24 hours) at 07/14/2021 1500 Last data filed at 07/14/2021 0427 Gross per 24 hour  Intake 120 ml  Output 1650 ml  Net -1530 ml   Filed Weights   07/11/21 0500 07/12/21 0500 07/13/21 0542  Weight: 116 kg 113 kg 111.7 kg    Examination:  General exam: Appears calm and comfortable  Respiratory system: Clear to auscultation. Respiratory effort normal. Cardiovascular system: S1 & S2 heard, RRR. No JVD, murmurs, rubs, gallops or clicks.. Gastrointestinal system: Abdomen is nondistended, soft and nontender. No organomegaly or masses felt. Normal bowel sounds heard. Central nervous system: Alert and oriented. No focal neurological deficits. Extremities: Bilateral lower extremity and right upper extremity edema. Skin: No rashes, lesions or ulcers Psychiatry: Difficult to assess due to language barrier   Data Reviewed: I have personally reviewed following labs and imaging studies  CBC: Recent Labs  Lab 07/10/21 0818 07/11/21 0510 07/12/21 0437 07/13/21 0421 07/14/21 0450  WBC 7.2 6.1 6.7 5.5 5.9  HGB 10.1* 9.9* 9.6* 9.4* 9.3*  HCT 31.8* 31.1* 30.0* 29.9*  29.4*  MCV 94.1 93.1 93.5 93.4 93.9  PLT 248 251 245 239 845   Basic Metabolic Panel: Recent Labs  Lab 07/08/21 0800 07/09/21 0517 07/10/21 0818 07/13/21 0421 07/14/21 0450  NA 138 135 137 136 136  K 4.0 5.0 3.9 3.2* 3.5  CL 97* 95* 97* 94* 94*  CO2 35* 30 33* 37* 36*  GLUCOSE 111* 99 96 97 105*  BUN 36* 37* 33* 32* 33*  CREATININE 1.27* 1.44* 1.28* 1.42* 1.51*  CALCIUM 9.7 9.6 10.2 9.9 10.2  MG 2.2 2.4  --  2.4  --   PHOS 2.8 2.9  --  3.2  --    GFR: Estimated Creatinine Clearance: 37.3 mL/min (A) (by C-G formula based on SCr of 1.51 mg/dL (H)). Liver Function Tests: No results for input(s): AST, ALT, ALKPHOS, BILITOT, PROT, ALBUMIN in the last 168 hours. No results for input(s):  LIPASE, AMYLASE in the last 168 hours. No results for input(s): AMMONIA in the last 168 hours. Coagulation Profile: Recent Labs  Lab 07/10/21 1134 07/11/21 0510 07/12/21 0437 07/13/21 0421 07/14/21 0450  INR 5.0* 4.0* 3.0* 3.4* 2.8*   Cardiac Enzymes: No results for input(s): CKTOTAL, CKMB, CKMBINDEX, TROPONINI in the last 168 hours. BNP (last 3 results) No results for input(s): PROBNP in the last 8760 hours. HbA1C: No results for input(s): HGBA1C in the last 72 hours. CBG: Recent Labs  Lab 07/13/21 0202  GLUCAP 100*   Lipid Profile: No results for input(s): CHOL, HDL, LDLCALC, TRIG, CHOLHDL, LDLDIRECT in the last 72 hours. Thyroid Function Tests: No results for input(s): TSH, T4TOTAL, FREET4, T3FREE, THYROIDAB in the last 72 hours. Anemia Panel: No results for input(s): VITAMINB12, FOLATE, FERRITIN, TIBC, IRON, RETICCTPCT in the last 72 hours. Sepsis Labs: No results for input(s): PROCALCITON, LATICACIDVEN in the last 168 hours.  Recent Results (from the past 240 hour(s))  Resp Panel by RT-PCR (Flu A&B, Covid) Nasopharyngeal Swab     Status: None   Collection Time: 07/13/21  6:05 PM   Specimen: Nasopharyngeal Swab; Nasopharyngeal(NP) swabs in vial transport medium  Result Value Ref Range Status   SARS Coronavirus 2 by RT PCR NEGATIVE NEGATIVE Final    Comment: (NOTE) SARS-CoV-2 target nucleic acids are NOT DETECTED.  The SARS-CoV-2 RNA is generally detectable in upper respiratory specimens during the acute phase of infection. The lowest concentration of SARS-CoV-2 viral copies this assay can detect is 138 copies/mL. A negative result does not preclude SARS-Cov-2 infection and should not be used as the sole basis for treatment or other patient management decisions. A negative result may occur with  improper specimen collection/handling, submission of specimen other than nasopharyngeal swab, presence of viral mutation(s) within the areas targeted by this assay, and  inadequate number of viral copies(<138 copies/mL). A negative result must be combined with clinical observations, patient history, and epidemiological information. The expected result is Negative.  Fact Sheet for Patients:  EntrepreneurPulse.com.au  Fact Sheet for Healthcare Providers:  IncredibleEmployment.be  This test is no t yet approved or cleared by the [place] FDA and  has been authorized for detection and/or diagnosis of SARS-CoV-2 by FDA under an Emergency Use Authorization (EUA). This EUA will remain  in effect (meaning this test can be used) for the duration of the COVID-19 declaration under Section 564(b)(1) of the Act, 21 U.S.C.section 360bbb-3(b)(1), unless the authorization is terminated  or revoked sooner.       Influenza A by PCR NEGATIVE NEGATIVE Final   Influenza B by PCR  NEGATIVE NEGATIVE Final    Comment: (NOTE) The Xpert Xpress SARS-CoV-2/FLU/RSV plus assay is intended as an aid in the diagnosis of influenza from Nasopharyngeal swab specimens and should not be used as a sole basis for treatment. Nasal washings and aspirates are unacceptable for Xpert Xpress SARS-CoV-2/FLU/RSV testing.  Fact Sheet for Patients: EntrepreneurPulse.com.au  Fact Sheet for Healthcare Providers: IncredibleEmployment.be  This test is not yet approved or cleared by the [place] FDA and has been authorized for detection and/or diagnosis of SARS-CoV-2 by FDA under an Emergency Use Authorization (EUA). This EUA will remain in effect (meaning this test can be used) for the duration of the COVID-19 declaration under Section 564(b)(1) of the Act, 21 U.S.C. section 360bbb-3(b)(1), unless the authorization is terminated or revoked.  Performed at [facility], [place] [address] [place], [place] [address]          Radiology Studies: No results found.      Scheduled Meds:   carvedilol  25 mg Oral BID   cholecalciferol  2,000 Units Oral Daily   doxycycline  100 mg Oral Q12H   exemestane  25 mg Oral QPC breakfast   levothyroxine  50 mcg Oral QAC breakfast   magnesium oxide  400 mg Oral BID   pantoprazole  40 mg Oral Daily   pravastatin  20 mg Oral q1800   torsemide  40 mg Oral BID   vitamin B-12  2,000 mcg Oral Daily   warfarin  2.5 mg Oral ONCE-1600   Warfarin - Pharmacist Dosing Inpatient   Does not apply q1600   Continuous Infusions:  sodium chloride 250 mL (07/04/21 0822)     LOS: 12 days    Time spent: 31 min    Georgette Shell, MD  07/14/2021, 3:00 PM

## 2021-07-14 NOTE — Progress Notes (Signed)
PT Cancellation Note  Patient Details Name: Katelyn Jackson MRN: [id number] DOB: 07-23-1946   Cancelled Treatment:     pt unable to tolerate today.  Pt has been evaluated with rec for SNF.  Will continue to follow during her Acute Care stay.    Rica Koyanagi  PTA Acute  Rehabilitation Services Pager      [phone] Office      [phone]

## 2021-07-14 NOTE — Progress Notes (Signed)
[place] for  warfarin Indication:  hx atrial fibrillation  Allergies  Allergen Reactions   Chlorhexidine Gluconate Hives   Penicillins Itching    Has patient had a PCN reaction causing immediate rash, facial/tongue/throat swelling, SOB or lightheadedness with hypotension: no Has patient had a PCN reaction causing severe rash involving mucus membranes or skin necrosis: No Has patient had a PCN reaction that required hospitalization: No Has patient had a PCN reaction occurring within the last 10 years: No If all of the above answers are "NO", then may proceed with Cephalosporin use.  Tolerated Cephalosporin Date: 03/17/21.    Patient Measurements: Height: 5\' 1"  (154.9 cm) Weight: 111.7 kg (246 lb 4.1 oz) IBW/kg (Calculated) : 47.8   Vital Signs: Temp: 98.3 F (36.8 C) (09/07 0424) Temp Source: Oral (09/07 0424) BP: 113/51 (09/07 0424) Pulse Rate: 59 (09/07 0424)  Labs: Recent Labs    07/12/21 0437 07/13/21 0421 07/14/21 0450  HGB 9.6* 9.4* 9.3*  HCT 30.0* 29.9* 29.4*  PLT 245 239 221  LABPROT 31.2* 34.1* 29.7*  INR 3.0* 3.4* 2.8*  CREATININE  --  1.42* 1.51*    Estimated Creatinine Clearance: 37.3 mL/min (A) (by C-G formula based on SCr of 1.51 mg/dL (H)).  Medications:  - on warfarin 2.5mg  daily except 5mg  on Tue PTA (last dose taken on 8/24)  Assessment: Patient is a 75 y.o F with hx afib on warfarin PTA, presented to the ED on 8/25 with failure to thrive, She also reported of a fall one week ago and reports poor oral intake afterwards. Head CT was negative for any acute abnormality.  Pharmacy has been consulted to dose warfarin while she's hospitalized.  - Vitamin K 2.5mg  PO x 1 given on 8/26 and 8/29 for elevated INR - Warfarin resumed 8/30  Today, 07/14/2021: INR now therapeutic again (no warfarin given x 4 days then reduced dose of 2mg  given 9/5 then no warf again 9/6) CBC: Hgb low but stable at 9.3, Pltc WNL Eating  25-50% of meals for most part  Goal of Therapy:  INR 2-3 Monitor platelets by anticoagulation protocol: Yes   Plan:  Will attempt to restart warfarin per home regimen as above - 2.5mg  today Daily PT/INR Monitor CBC and for s/sx of bleeding   Adrian Saran, PharmD, BCPS Secure Chat if ?s 07/14/2021 8:45 AM

## 2021-07-14 NOTE — Progress Notes (Addendum)
   ORTHOPAEDIC PROGRESS NOTE  SUBJECTIVE: Patient more lethargic today. Her son is at the bedside. He notices the change in her status as well. The area on the distal aspect of her incision has increased in size. She reports moderate pain in the shoulder.   OBJECTIVE: PE: General: lethargic, NAD Right shoulder: there is a bullae at the distal aspect of the patient's old incision site, there is surrounding erythema and induration, she reports tenderness here. She tolerates about 80 degrees of passive forward elevation. She endorses distal sensation. Warm well perfused hand.  Bilateral lower extremities: severely obese legs with bilateral lymphedema. Redness noted on the left lower leg.   Vitals:   07/14/21 0948 07/14/21 1210  BP: (!) 133/55 121/62  Pulse: 63 60  Resp:  20  Temp:  98.6 F (37 C)  SpO2:  96%     ASSESSMENT: Katelyn Jackson is a 75 y.o. female  - Lower extremity cellulitis - s/p left shoulder hemiarthroplasty for unreconstructable shoulder - bilateral knee osteoarthritis - bilateral leg lymphedema - morbid obesity   PLAN: - Right shoulder: Patient's shoulder chronically appears dislocated due to her unreconstructable shoulder from previous right humerus and scapula fractures with end-stage degenerative changes in the joint. She had a right hemiarthroplasty in 03/2021. Appearance of her shoulder is chronic. She does have increasing redness and a new bullae on the distal aspect of her old incision.  She has been on doxycycline with no improvement.   - will request IR consultation for aspiration of this area and her shoulder joint for cultures - cell count, culture  - if there is a large fluid collection she may require a drain  - will continue to monitor   - may possibly need a surgical washout if there are concerning signs of infection with aspiration of the joint  - Bilateral lower extremities: Nothing further from orthopedic surgery.  Despite the need for  bilateral total knee replacements she is certainly not a surgical candidate from a cardiac, renal or body habitus standpoint.  Would benefit from skilled PT and OT for mobility purposes at a SNF as she could not feed herself or mobilize herself within the bed much less transferring out of bed.    Katelyn Chapel, PA-C 07/14/2021

## 2021-07-14 NOTE — Progress Notes (Signed)
OT Cancellation Note  Patient Details Name: Katelyn Jackson MRN: [id number] DOB: July 13, 1946   Cancelled Treatment:    Reason Eval/Treat Not Completed: Medical issues which prohibited therapy patient noted to have about golf ball sized purple area on R shoulder with increased temperature. Interpreter who knows patient from past admissions in room and reporting that patient is not herself today. Nurse reported awaiting ortho consult at this time. Will hold off today. Will continue to follow.  [place] OTR/L, [place] Acute Rehabilitation Department Office# [phone] Pager# [phone]  07/14/2021, 10:35 AM

## 2021-07-15 ENCOUNTER — Telehealth: Payer: Self-pay | Admitting: Internal Medicine

## 2021-07-15 DIAGNOSIS — L03116 Cellulitis of left lower limb: Secondary | ICD-10-CM | POA: Diagnosis not present

## 2021-07-15 LAB — URINALYSIS, ROUTINE W REFLEX MICROSCOPIC
Bilirubin Urine: NEGATIVE
Glucose, UA: NEGATIVE mg/dL
Hgb urine dipstick: NEGATIVE
Ketones, ur: NEGATIVE mg/dL
Leukocytes,Ua: NEGATIVE
Nitrite: NEGATIVE
Protein, ur: NEGATIVE mg/dL
Specific Gravity, Urine: <1.005 — ABNORMAL LOW (ref 1.005–1.030)
pH: 6.5 (ref 5.0–8.0)

## 2021-07-15 LAB — PROTIME-INR
INR: 2.4 — ABNORMAL HIGH (ref 0.8–1.2)
Prothrombin Time: 26.1 seconds — ABNORMAL HIGH (ref 11.4–15.2)

## 2021-07-15 MED ORDER — VANCOMYCIN HCL 2000 MG/400ML IV SOLN
2000.0000 mg | Freq: Once | INTRAVENOUS | Status: AC
  Administered 2021-07-15: 2000 mg via INTRAVENOUS
  Filled 2021-07-15: qty 400

## 2021-07-15 MED ORDER — WARFARIN SODIUM 2.5 MG PO TABS
2.5000 mg | ORAL_TABLET | Freq: Once | ORAL | Status: AC
  Administered 2021-07-15: 2.5 mg via ORAL
  Filled 2021-07-15: qty 1

## 2021-07-15 MED ORDER — CARVEDILOL 6.25 MG PO TABS
6.2500 mg | ORAL_TABLET | Freq: Two times a day (BID) | ORAL | Status: DC
  Administered 2021-07-16 – 2021-07-30 (×23): 6.25 mg via ORAL
  Filled 2021-07-15 (×29): qty 1

## 2021-07-15 MED ORDER — VANCOMYCIN HCL 750 MG/150ML IV SOLN
750.0000 mg | INTRAVENOUS | Status: DC
  Administered 2021-07-16 – 2021-07-19 (×4): 750 mg via INTRAVENOUS
  Filled 2021-07-15 (×4): qty 150

## 2021-07-15 MED ORDER — CARVEDILOL 12.5 MG PO TABS: 12.5000 mg | ORAL_TABLET | Freq: Every day | ORAL | Status: DC

## 2021-07-15 NOTE — TOC Progression Note (Signed)
Transition of Care [facility]) - Progression Note    Patient Details  Name: Katelyn Jackson MRN: [id number] Date of Birth: Jul 28, 1946  Transition of Care [facility]) CM/SW Contact  Yancy Knoble, Juliann Pulse, RN Phone Number: 07/15/2021, 12:21 PM  Clinical Narrative: [name] chose [facility] for SNF-rep [facility] aware of not vaccinated-awaiting medical stability prior starting auth.      Expected Discharge Plan: Skilled Nursing Facility Barriers to Discharge: Continued Medical Work up  Expected Discharge Plan and Services Expected Discharge Plan: [place]                                               Social Determinants of Health (SDOH) Interventions    Readmission Risk Interventions Readmission Risk Prevention Plan 07/06/2021 03/19/2021  Transportation Screening Complete Complete  HRI or [place] - Complete  Social Work Consult for [place] Planning/Counseling - Complete  Palliative Care Screening - Not Applicable  Medication Review Press photographer) - Complete  HRI or Home Care Consult Complete -  SW Recovery Care/Counseling Consult Complete -  Palliative Care Screening Not Applicable -  Skilled Nursing Facility Complete -  Some recent data might be hidden

## 2021-07-15 NOTE — Progress Notes (Signed)
PT Cancellation Note  Patient Details Name: Katelyn Jackson MRN: [id number] DOB: 1946/04/28   Cancelled Treatment:     Patient has a stitch abscess in the right shoulder for which she is going to IR today for incision drainage and culture of the drainage. Pt has been evaluated with rec for SNF.  Will continue to follow during her Acute Care stay.   Rica Koyanagi  PTA Acute  Rehabilitation Services Pager      [phone] Office      [phone]

## 2021-07-15 NOTE — Telephone Encounter (Signed)
Katelyn Jackson from [facility] calling in regarding order # [id number] they sent in originally 06.23.22  Also says they have faxed it several times after that date  Order is 2 months out of medicare compliance & they need this order to be signed & completed as soon as possible

## 2021-07-15 NOTE — Progress Notes (Signed)
[place] for  warfarin Indication:  hx atrial fibrillation  Allergies  Allergen Reactions   Chlorhexidine Gluconate Hives   Penicillins Itching    Has patient had a PCN reaction causing immediate rash, facial/tongue/throat swelling, SOB or lightheadedness with hypotension: no Has patient had a PCN reaction causing severe rash involving mucus membranes or skin necrosis: No Has patient had a PCN reaction that required hospitalization: No Has patient had a PCN reaction occurring within the last 10 years: No If all of the above answers are "NO", then may proceed with Cephalosporin use.  Tolerated Cephalosporin Date: 03/17/21.    Patient Measurements: Height: 5\' 1"  (154.9 cm) Weight: 111 kg (244 lb 11.4 oz) IBW/kg (Calculated) : 47.8   Vital Signs: Temp: 99.1 F (37.3 C) (09/08 1126) Temp Source: Oral (09/08 1126) BP: 124/63 (09/08 1126) Pulse Rate: 57 (09/08 1126)  Labs: Recent Labs    07/13/21 0421 07/14/21 0450 07/15/21 0431  HGB 9.4* 9.3*  --   HCT 29.9* 29.4*  --   PLT 239 221  --   LABPROT 34.1* 29.7* 26.1*  INR 3.4* 2.8* 2.4*  CREATININE 1.42* 1.51*  --     Estimated Creatinine Clearance: 37.1 mL/min (A) (by C-G formula based on SCr of 1.51 mg/dL (H)).  Medications:  - on warfarin 2.5mg  daily except 5mg  on Tue PTA (last dose taken on 8/24)  Assessment: Patient is a 75 y.o F with hx afib on warfarin PTA, presented to the ED on 8/25 with failure to thrive, She also reported of a fall one week ago and reports poor oral intake afterwards. Head CT was negative for any acute abnormality.  Pharmacy has been consulted to dose warfarin while she's hospitalized.  - Vitamin K 2.5mg  PO x 1 given on 8/26 and 8/29 for elevated INR - Warfarin resumed 8/30  Today, 07/15/2021: INR now therapeutic again at 2.4  (no warfarin given x 4 days then reduced dose of 2mg  given 9/5 then no warf again 9/6, 2.5 mg given 9/7) CBC: Hgb low but stable  at 9.3, Pltc WNL ( 9/7)  Eating 10-50% of meals for most part  Goal of Therapy:  INR 2-3 Monitor platelets by anticoagulation protocol: Yes   Plan:  Warfarin per home regimen as above - 2.5mg  today Daily PT/INR Monitor CBC and for s/sx of bleeding   Royetta Asal, PharmD, BCPS 07/15/2021 12:43 PM

## 2021-07-15 NOTE — Progress Notes (Signed)
PROGRESS NOTE    Katelyn Jackson  EHU:[id number] DOB: 1946/06/03 DOA: 07/01/2021 PCP: Cassandria Anger, MD   Brief Narrative: 75 years old female with PMH significant for permanent A. fib on Coumadin, hypertension, hyperlipidemia, pacemaker in place, history of breast cancer, hypothyroidism, chronic bilateral lymphedema, recurrent falls, morbid obesity, BMI 51, GERD, pulmonary hypertension who needs full assistance for daily life activities, She has home health aide 6 times a week at home,  brought to the ED by her son for the evaluation of recent fall on last Monday.  Patient fell from bed on Monday since then complaining about left shoulder pain,  left ankle pain and bilateral knee pain and unable to walk or stand, go to the bathroom and progressively declining. She was found to have left lower extremity cellulitis.  Patient completed antibiotics.  Cellulitis has resolved.  Patient was given vitamin K due to supratherapeutic INR.  She was also given Lasix for CHF exacerbation.  She was managed with IV antibiotics for cellulitis,  IV Lasix for CHF.  ABG showed CO2 retention-chronic hypercapnia compensated.  Assessment & Plan:   Principal Problem:   Cellulitis of left lower extremity Active Problems:   Essential hypertension   Chronic atrial fibrillation (HCC)   Lymphedema   Anemia of chronic disease   CRF (chronic renal failure), stage 4 (severe) (HCC)   Morbid obesity (HCC)   GERD (gastroesophageal reflux disease)   Hypothyroidism   Supratherapeutic INR   Pulmonary edema   Pressure injury of skin  Cellulitis of left lower extremity/ Chronic bilateral lymphedema: Completed antibiotics.  Erythema and warmth has resolved.   Recurrent falls / Ambulatory dysfunction / moderate to severe osteoarthritis: Patient had complete skeletal survey.  X-ray of the knees and left ankle: No fracture Right hip: Degenerative disc pain.  Patient has chronic pain for years. Dr. Para March orthopedics  was consulted.  Adequate pain control with oral and IV opiates. Despite the need for bilateral total knee replacements,  She is certainly not a surgical candidate from cardiac, renal and body habitus standpoint. PT and OT recommended  skilled nursing facility. Patient currently medically stable for transfer to SNF   Acute hypoxic respiratory failure could be multifactorial:  > Resolved. secondary to fluid overload,  acute on chronic systolic CHF, hypercapnia likely from OSA. Respiratory status much improved.  Patient had adequate diuresis with negative fluid balance. Continue supplemental oxygen to keep saturation above 94%. Diuretics changed to torsemide po 40 mg twice daily. She is now 93% on room air.   AKI on CKD stage IIIb: Baseline serum creatinine 1.4-1.6 Creatinine improved with IV Lasix and remained stable. Avoid nephrotoxic medications.   Essential hypertension: Blood pressure 121/62 decrease the dose of Coreg   Chronic atrial fibrillation: Continue Coreg, heart rate controlled. Coumadin was on hold because of supratherapeutic INR.   INR today is 2.8 Restart Coumadin   Pacemaker in place: Stable.  Hypothyroidism: TSH 2.7 on 07/02/2021 Continue levothyroxine.   Anemia of chronic disease: Hemoglobin remained stable, no obvious bleeding.   History of breast cancer: Outpatient follow-up.   Right shoulder pain and swelling: Venous duplex ruled out DVT. Right shoulder x-ray and CT scan showed  chronic anterior shoulder dislocation. Will change antibiotics to vancomycin since no improvement with doxycycline.  Patient has a stitch abscess in the right shoulder for which she is going to IR today for incision drainage and culture of the drainage.  Encephalopathy/changes in mental status family concerned this is not her baseline.  ABG  unremarkable Hold any sedatives or narcotics B12 folate rpr ammonuia   Pressure Injury 07/03/21 Thigh Posterior;Right Stage 3 -  Full  thickness tissue loss. Subcutaneous fat may be visible but bone, tendon or muscle are NOT exposed. (Active)  07/03/21 0453  Location: Thigh  Location Orientation: Posterior;Right  Staging: Stage 3 -  Full thickness tissue loss. Subcutaneous fat may be visible but bone, tendon or muscle are NOT exposed.  Wound Description (Comments):   Present on Admission: Yes     Pressure Injury 07/03/21 Sacrum Stage 2 -  Partial thickness loss of dermis presenting as a shallow open injury with a red, pink wound bed without slough. (Active)  07/03/21 2105  Location: Sacrum  Location Orientation:   Staging: Stage 2 -  Partial thickness loss of dermis presenting as a shallow open injury with a red, pink wound bed without slough.  Wound Description (Comments):   Present on Admission: Yes    Estimated body mass index is 46.24 kg/m as calculated from the following:   Height as of this encounter: 5\' 1"  (1.549 m).   Weight as of this encounter: 111 kg.  DVT prophylaxis: Coumadin  code Status: Full code Family Communication: None at bedside  disposition Plan:  Status is: Inpatient  Remains inpatient appropriate because:Hemodynamically unstable  Dispo: The patient is from: Home              Anticipated d/c is to: SNF              Patient currently is not medically stable to d/c.   Difficult to place patient No       Consultants:  Ortho  Procedures: None Antimicrobials: Doxycycline  Subjective: She is resting in bed.  Staff was concerned if the stitch abscess in the right shoulder area is getting more erythematous edematous and painful with very limited movement to the right shoulder.  Family is also concerned that she is more confused than her baseline.  Objective: Vitals:   07/14/21 2040 07/15/21 0519 07/15/21 0530 07/15/21 1126  BP: (!) 122/58 117/60  124/63  Pulse: 60 (!) 57  (!) 57  Resp: 18 18  (!) 24  Temp: 99.7 F (37.6 C) 99.3 F (37.4 C)  99.1 F (37.3 C)  TempSrc: Oral Oral   Oral  SpO2: 92% 92%  90%  Weight:   111 kg   Height:        Intake/Output Summary (Last 24 hours) at 07/15/2021 1159 Last data filed at 07/15/2021 0956 Gross per 24 hour  Intake 1020 ml  Output 2350 ml  Net -1330 ml    Filed Weights   07/12/21 0500 07/13/21 0542 07/15/21 0530  Weight: 113 kg 111.7 kg 111 kg    Examination:  General exam: Appears calm and comfortable  Respiratory system: Clear to auscultation. Respiratory effort normal. Cardiovascular system: S1 & S2 heard, RRR. No JVD, murmurs, rubs, gallops or clicks.. Gastrointestinal system: Abdomen is nondistended, soft and nontender. No organomegaly or masses felt. Normal bowel sounds heard. Central nervous system: Alert and oriented. No focal neurological deficits. Extremities: Bilateral lower extremity and right upper extremity edema. Skin: No rashes, lesions or ulcers Psychiatry: Difficult to assess due to language barrier   Data Reviewed: I have personally reviewed following labs and imaging studies  CBC: Recent Labs  Lab 07/10/21 0818 07/11/21 0510 07/12/21 0437 07/13/21 0421 07/14/21 0450  WBC 7.2 6.1 6.7 5.5 5.9  HGB 10.1* 9.9* 9.6* 9.4* 9.3*  HCT 31.8* 31.1* 30.0* 29.9*  29.4*  MCV 94.1 93.1 93.5 93.4 93.9  PLT 248 251 245 239 017    Basic Metabolic Panel: Recent Labs  Lab 07/09/21 0517 07/10/21 0818 07/13/21 0421 07/14/21 0450  NA 135 137 136 136  K 5.0 3.9 3.2* 3.5  CL 95* 97* 94* 94*  CO2 30 33* 37* 36*  GLUCOSE 99 96 97 105*  BUN 37* 33* 32* 33*  CREATININE 1.44* 1.28* 1.42* 1.51*  CALCIUM 9.6 10.2 9.9 10.2  MG 2.4  --  2.4  --   PHOS 2.9  --  3.2  --     GFR: Estimated Creatinine Clearance: 37.1 mL/min (A) (by C-G formula based on SCr of 1.51 mg/dL (H)). Liver Function Tests: No results for input(s): AST, ALT, ALKPHOS, BILITOT, PROT, ALBUMIN in the last 168 hours. No results for input(s): LIPASE, AMYLASE in the last 168 hours. No results for input(s): AMMONIA in the last 168  hours. Coagulation Profile: Recent Labs  Lab 07/11/21 0510 07/12/21 0437 07/13/21 0421 07/14/21 0450 07/15/21 0431  INR 4.0* 3.0* 3.4* 2.8* 2.4*    Cardiac Enzymes: No results for input(s): CKTOTAL, CKMB, CKMBINDEX, TROPONINI in the last 168 hours. BNP (last 3 results) No results for input(s): PROBNP in the last 8760 hours. HbA1C: No results for input(s): HGBA1C in the last 72 hours. CBG: Recent Labs  Lab 07/13/21 0202  GLUCAP 100*    Lipid Profile: No results for input(s): CHOL, HDL, LDLCALC, TRIG, CHOLHDL, LDLDIRECT in the last 72 hours. Thyroid Function Tests: No results for input(s): TSH, T4TOTAL, FREET4, T3FREE, THYROIDAB in the last 72 hours. Anemia Panel: No results for input(s): VITAMINB12, FOLATE, FERRITIN, TIBC, IRON, RETICCTPCT in the last 72 hours. Sepsis Labs: No results for input(s): PROCALCITON, LATICACIDVEN in the last 168 hours.  Recent Results (from the past 240 hour(s))  Resp Panel by RT-PCR (Flu A&B, Covid) Nasopharyngeal Swab     Status: None   Collection Time: 07/13/21  6:05 PM   Specimen: Nasopharyngeal Swab; Nasopharyngeal(NP) swabs in vial transport medium  Result Value Ref Range Status   SARS Coronavirus 2 by RT PCR NEGATIVE NEGATIVE Final    Comment: (NOTE) SARS-CoV-2 target nucleic acids are NOT DETECTED.  The SARS-CoV-2 RNA is generally detectable in upper respiratory specimens during the acute phase of infection. The lowest concentration of SARS-CoV-2 viral copies this assay can detect is 138 copies/mL. A negative result does not preclude SARS-Cov-2 infection and should not be used as the sole basis for treatment or other patient management decisions. A negative result may occur with  improper specimen collection/handling, submission of specimen other than nasopharyngeal swab, presence of viral mutation(s) within the areas targeted by this assay, and inadequate number of viral copies(<138 copies/mL). A negative result must be combined  with clinical observations, patient history, and epidemiological information. The expected result is Negative.  Fact Sheet for Patients:  EntrepreneurPulse.com.au  Fact Sheet for Healthcare Providers:  IncredibleEmployment.be  This test is no t yet approved or cleared by the [place] FDA and  has been authorized for detection and/or diagnosis of SARS-CoV-2 by FDA under an Emergency Use Authorization (EUA). This EUA will remain  in effect (meaning this test can be used) for the duration of the COVID-19 declaration under Section 564(b)(1) of the Act, 21 U.S.C.section 360bbb-3(b)(1), unless the authorization is terminated  or revoked sooner.       Influenza A by PCR NEGATIVE NEGATIVE Final   Influenza B by PCR NEGATIVE NEGATIVE Final    Comment: (NOTE)  The Xpert Xpress SARS-CoV-2/FLU/RSV plus assay is intended as an aid in the diagnosis of influenza from Nasopharyngeal swab specimens and should not be used as a sole basis for treatment. Nasal washings and aspirates are unacceptable for Xpert Xpress SARS-CoV-2/FLU/RSV testing.  Fact Sheet for Patients: EntrepreneurPulse.com.au  Fact Sheet for Healthcare Providers: IncredibleEmployment.be  This test is not yet approved or cleared by the [place] FDA and has been authorized for detection and/or diagnosis of SARS-CoV-2 by FDA under an Emergency Use Authorization (EUA). This EUA will remain in effect (meaning this test can be used) for the duration of the COVID-19 declaration under Section 564(b)(1) of the Act, 21 U.S.C. section 360bbb-3(b)(1), unless the authorization is terminated or revoked.  Performed at [facility], [place] [address] [place], [place] [address]           Radiology Studies: No results found.      Scheduled Meds:  carvedilol  12.5 mg Oral BID   cholecalciferol  2,000 Units Oral Daily   exemestane   25 mg Oral QPC breakfast   levothyroxine  50 mcg Oral QAC breakfast   magnesium oxide  400 mg Oral BID   pantoprazole  40 mg Oral Daily   pravastatin  20 mg Oral q1800   torsemide  40 mg Oral BID   vitamin B-12  2,000 mcg Oral Daily   Warfarin - Pharmacist Dosing Inpatient   Does not apply q1600   Continuous Infusions:  sodium chloride 250 mL (07/04/21 0822)     LOS: 13 days    Time spent: 36 min    Georgette Shell, MD  07/15/2021, 11:59 AM

## 2021-07-15 NOTE — Telephone Encounter (Signed)
2nd fax was received 07/07/21, and was faxed back today to [phone], along w/ order # [id number].Marland KitchenChryl Heck

## 2021-07-15 NOTE — Consult Note (Signed)
Chief Complaint: Patient was seen in consultation today for right shoulder fluid collection  Referring Physician(s): [place], PA-C  Supervising Physician: Aletta Edouard  Patient Status: [name] - In-pt  History of Present Illness: Katelyn Jackson is a 75 y.o. female with past medical history of breast cancer, chronic a fib on Coumadin, GERD, HLD, HTN, moderate to severe pulonary hypertension who presented to the ED 8/22 with right shoulder pain after a fall at home.  Patient did have surgery on her shoulder in May of this year; she is s/p right shoulder hemi-arthroplasty. She returned to the ED 8/25 with concerns for UTI and difficulty walking after her fall. She was admitted for weakness, LLE cellulitis, however has not developed redness, fluctuance at the distal aspect of her prior surgical incision. IR consulted for aspiration and drainage.   Patient assessed at bedside.  She is sleepy and stoic.  Makes little effort to interact but is alert and arousable. She has not been NPO today.   Past Medical History:  Diagnosis Date   Arthritis    Cancer [facility])    breast cancer   Chronic atrial fibrillation (HCC)    Chronic kidney disease    sees Dr Florene Glen   Cramps, muscle, general    Dyspnea on exertion    Dysrhythmia    a-fib,    GERD (gastroesophageal reflux disease)    Headache    Hyperlipidemia    Hypertension    Hypothyroidism    Lymphedema    Moderate to severe pulmonary hypertension (HCC)    Obesity    Personal history of radiation therapy    Pneumonia 12/2020   Presence of permanent cardiac pacemaker 01/21/2021   for bradycardia    Syncope 12/2020   needed a pacemaker    Past Surgical History:  Procedure Laterality Date   APPENDECTOMY     BREAST BIOPSY Left 2018   BREAST LUMPECTOMY Left 04/04/2017   x3   BREAST LUMPECTOMY WITH NEEDLE LOCALIZATION AND AXILLARY SENTINEL LYMPH NODE BX Left 04/04/2017   Procedure: BREAST LUMPECTOMY WITH NEEDLE LOCALIZATION  x3 AND AXILLARY SENTINEL LYMPH NODE BX;  Surgeon: [name], MD;  Location: [facility];  Service: General;  Laterality: Left;   CATARACT EXTRACTION W/ INTRAOCULAR LENS  IMPLANT, BILATERAL Bilateral 2018   COLONOSCOPY     EYE SURGERY     GLAUCOMA SURGERY Bilateral 2018   INSERT / REPLACE / REMOVE PACEMAKER  01/21/2021   RE-EXCISION OF BREAST CANCER,SUPERIOR MARGINS Left 04/26/2017   Procedure: RE-EXCISION OF LEFT BREAST CANCER;  Surgeon: [name], MD;  Location: [place];  Service: General;  Laterality: Left;   SHOULDER HEMI-ARTHROPLASTY Right 03/17/2021   Procedure: SHOULDER HEMI-ARTHROPLASTY;  Surgeon: Hiram Gash, MD;  Location: [facility];  Service: Orthopedics;  Laterality: Right;    Allergies: Chlorhexidine gluconate and Penicillins  Medications: Prior to Admission medications   Medication Sig Start Date End Date Taking? Authorizing Provider  allopurinol (ZYLOPRIM) 100 MG tablet Take 0.5 tablets (50 mg total) by mouth daily. Patient taking differently: Take 100 mg by mouth daily. 10/08/20  Yes Plotnikov, [name], MD  carvedilol (COREG) 25 MG tablet Take 1 tablet (25 mg total) by mouth 2 (two) times daily. 05/11/21 05/11/22 Yes Plotnikov, [name], MD  Cholecalciferol (VITAMIN D3) 2000 units capsule Take 1 capsule (2,000 Units total) by mouth daily. 07/11/17  Yes Plotnikov, [name], MD  Cyanocobalamin (VITAMIN B-12 PO) Take 2,000 mcg by mouth daily.   Yes [provider]  diclofenac  Sodium (VOLTAREN) 1 % GEL APPLY FOUR GRAMS FOUR TIMES DAILY AS NEEDED Patient taking differently: Apply 4 g topically 4 (four) times daily as needed (pain). 06/11/21  Yes Plotnikov, [name], MD  exemestane (AROMASIN) 25 MG tablet Take 1 tablet (25 mg total) by mouth daily after breakfast. 10/08/20  Yes Plotnikov, [name], MD  levothyroxine (SYNTHROID) 50 MCG tablet Take 1 tablet (50 mcg total) by mouth daily before breakfast. Overdue for Annual appt must see provider for future refills 02/09/21  Yes  Plotnikov, [name], MD  lipase/protease/amylase (CREON) 12000-38000 units CPEP capsule Take 1 capsule (12,000 Units total) by mouth daily as needed (stomach problems). 10/08/20  Yes Plotnikov, [name], MD  lovastatin (MEVACOR) 20 MG tablet Take 1 tablet (20 mg total) by mouth daily. 10/08/20  Yes Plotnikov, [name], MD  magnesium oxide (MAG-OX) 400 MG tablet Take 400 mg by mouth 2 (two) times daily.   Yes [provider]  methocarbamol (ROBAXIN) 500 MG tablet Take 1 tablet (500 mg total) by mouth every 8 (eight) hours as needed for muscle spasms. 03/19/21  Yes McBane, Maylene Roes, PA-C  omeprazole (PRILOSEC) 40 MG capsule Take 1 capsule (40 mg total) by mouth daily. Overdue for Annual appt must see provider for future refills Patient taking differently: Take 40 mg by mouth daily as needed (acid reflux). Overdue for Annual appt must see provider for future refills 10/08/20  Yes Plotnikov, [name], MD  OVER THE COUNTER MEDICATION Apply 1 application topically daily as needed (apply to buttocks  as needed for irritaiton). Intensive Skin Care Therapy   Yes [provider]  polyethylene glycol powder (GLYCOLAX/MIRALAX) powder mix 1 capful (17 grams) IN 8 ounces OF liquid EVERY DAY Patient taking differently: Take 17 g by mouth daily as needed for mild constipation. 11/05/18  Yes Plotnikov, [name], MD  potassium chloride (KLOR-CON) 8 MEQ tablet Take 1 tablet (8 mEq total) by mouth daily. 10/08/20  Yes Plotnikov, [name], MD  torsemide (DEMADEX) 20 MG tablet Take 60 mg every morning and 40 mg in the pm (2 pm) 05/11/21  Yes Plotnikov, [name], MD  traMADol (ULTRAM) 50 MG tablet Take 1 tablet (50 mg total) by mouth every 8 (eight) hours as needed for severe pain. 03/19/21  Yes McBane, Maylene Roes, PA-C  warfarin (COUMADIN) 5 MG tablet Take 1 tablet daily except take 1/2 tablet on Wed and Sat or Take as directed by anticoagulation clinic Patient taking differently: Take 2.5-5 mg by mouth  See admin instructions. 2.5mg  every day except for Tuesday's. Tuesday's take 5mg . Total weekly dose of 20mg . 10/08/20  Yes Plotnikov, [name], MD  tamoxifen (NOLVADEX) 20 MG tablet Take 1 tablet (20 mg total) by mouth daily. Patient taking differently: Take 20 mg by mouth daily. Will take after finishing Exemestane 06/10/21   Truitt Merle, MD     Family History  Problem Relation Age of Onset   CAD Mother 19   Stroke Mother 41       hemorr CVA   COPD Father 59   Cancer Neg Hx     Social History   Socioeconomic History   Marital status: Widowed    Spouse name: Not on file   Number of children: 1   Years of education: Not on file   Highest education level: Not on file  Occupational History   Not on file  Tobacco Use   Smoking status: Never   Smokeless tobacco: Never  Vaping Use  Vaping Use: Never used  Substance and Sexual Activity   Alcohol use: No    Alcohol/week: 0.0 standard drinks   Drug use: No   Sexual activity: Not on file  Other Topics Concern   Not on file  Social History Narrative   Not on file   Social Determinants of Health   Financial Resource Strain: Low Risk    Difficulty of Paying Living Expenses: Not hard at all  Food Insecurity: No Food Insecurity   Worried About Charity fundraiser in the Last Year: Never true   Arboriculturist in the Last Year: Never true  Transportation Needs: Public librarian (Medical): Yes   Lack of Transportation (Non-Medical): Yes  Physical Activity: Inactive   Days of Exercise per Week: 0 days   Minutes of Exercise per Session: 0 min  Stress: No Stress Concern Present   Feeling of Stress : Not at all  Social Connections: Socially Isolated   Frequency of Communication with Friends and Family: More than three times a week   Frequency of Social Gatherings with Friends and Family: Once a week   Attends Religious Services: Never   Marine scientist or Organizations: No   Attends Theatre manager Meetings: Never   Marital Status: Widowed     Review of Systems: A 12 point ROS discussed and pertinent positives are indicated in the HPI above.  All other systems are negative.  Review of Systems  Constitutional:  Negative for fatigue and fever.  Respiratory:  Negative for cough and shortness of breath.   Cardiovascular:  Negative for chest pain.  Gastrointestinal:  Negative for abdominal pain, diarrhea and nausea.  Genitourinary:  Negative for dysuria.  Musculoskeletal:  Negative for back pain.  Psychiatric/Behavioral:  Negative for behavioral problems and confusion.    Vital Signs: BP 124/63 (BP Location: Left Arm)   Pulse (!) 57   Temp 99.1 F (37.3 C) (Oral)   Resp (!) 24   Ht 5\' 1"  (1.549 m)   Wt 244 lb 11.4 oz (111 kg)   LMP  (LMP Unknown)   SpO2 90%   BMI 46.24 kg/m   Physical Exam Vitals and nursing note reviewed.  Constitutional:      General: She is not in acute distress.    Appearance: She is not ill-appearing.  HENT:     Mouth/Throat:     Mouth: Mucous membranes are moist.     Pharynx: Oropharynx is clear.  Cardiovascular:     Rate and Rhythm: Normal rate and regular rhythm.  Pulmonary:     Effort: Pulmonary effort is normal.     Breath sounds: Normal breath sounds.  Abdominal:     General: Abdomen is flat.     Palpations: Abdomen is soft.  Skin:    General: Skin is warm and dry.  Neurological:     General: No focal deficit present.     Mental Status: She is oriented to person, place, and time. Mental status is at baseline.  Psychiatric:        Mood and Affect: Mood normal.        Behavior: Behavior normal.        Thought Content: Thought content normal.        Judgment: Judgment normal.     MD Evaluation Airway: WNL Heart: WNL Abdomen: WNL Chest/ Lungs: WNL ASA  Classification: 3 Mallampati/Airway Score: Two   Imaging: DG Chest 2 View  Result Date: 07/01/2021 CLINICAL DATA:  Fall EXAM: CHEST - 2 VIEW COMPARISON:   03/18/2021 FINDINGS: Cardiomegaly, unchanged from prior exam. Left chest cardiac device with leads in the right atrium and ventricle. Redemonstrated vascular congestion, with interstitial prominence throughout the lungs, likely interstitial edema. No definite pleural effusion. No acute osseous abnormality. Status post right humeral head replacement. IMPRESSION: Cardiomegaly with pulmonary edema. Electronically Signed   By: Merilyn Baba M.D.   On: 07/01/2021 12:52   DG Shoulder Right  Result Date: 07/07/2021 CLINICAL DATA:  Fall several days ago with persistent shoulder pain EXAM: RIGHT SHOULDER - 2+ VIEW COMPARISON:  06/28/2021 FINDINGS: Right shoulder arthroplasty is noted. Findings suggestive of anterior dislocation of the humeral component from the glenoid is noted. No definitive fracture is seen. Degenerative changes about the acromioclavicular joint are seen. Underlying bony thorax appears within normal limits. IMPRESSION: Changes highly suggestive of anterior dislocation of the humeral prosthesis. CT may be helpful for further evaluation. Electronically Signed   By: Inez Catalina M.D.   On: 07/07/2021 16:39   DG Shoulder Right  Result Date: 06/28/2021 CLINICAL DATA:  Right shoulder pain after fall today. EXAM: RIGHT SHOULDER - 2+ VIEW COMPARISON:  None. FINDINGS: Right shoulder prosthesis appears to be well situated. No fracture or dislocation is noted. IMPRESSION: Status post right shoulder arthroplasty.  No acute abnormality seen. Electronically Signed   By: Marijo Conception M.D.   On: 06/28/2021 14:11   DG Ankle 2 Views Left  Result Date: 07/01/2021 CLINICAL DATA:  [place] last week twisting the ankle. Lymph edema with swelling of the entire leg. Unable to move leg. Pain. EXAM: LEFT ANKLE - 2 VIEW COMPARISON:  None. FINDINGS: Nonstandard positioning limits evaluation. Diffuse soft tissue swelling about the visualized left lower extremity. Prominent degenerative changes demonstrated in the left ankle  and intertarsal joints. No acute displaced fractures are identified. No bone erosion or bone destruction. Plantar calcaneal spur. Vascular calcifications. IMPRESSION: Prominent degenerative changes in the left foot and ankle. No definite evidence of an acute fracture. Diffuse soft tissue swelling. Electronically Signed   By: Lucienne Capers M.D.   On: 07/01/2021 21:31   CT HEAD WO CONTRAST (5MM)  Result Date: 07/01/2021 CLINICAL DATA:  Altered mental status. EXAM: CT HEAD WITHOUT CONTRAST TECHNIQUE: Contiguous axial images were obtained from the base of the skull through the vertex without intravenous contrast. COMPARISON:  CT head dated January 05, 2021. FINDINGS: Brain: No evidence of acute infarction, hemorrhage, hydrocephalus, extra-axial collection or mass lesion/mass effect. Stable mild atrophy and chronic microvascular ischemic changes. Vascular: No hyperdense vessel or unexpected calcification. Skull: Normal. Negative for fracture or focal lesion. Sinuses/Orbits: No acute finding. Other: None. IMPRESSION: 1. No acute intracranial abnormality. Electronically Signed   By: Titus Dubin M.D.   On: 07/01/2021 13:01   CT LUMBAR SPINE WO CONTRAST  Result Date: 07/06/2021 CLINICAL DATA:  Osteoarthritis, lumbosacral Spinal stenosis, lumbosacral Low back pain, progressive neurologic deficit EXAM: CT LUMBAR SPINE WITHOUT CONTRAST TECHNIQUE: Multidetector CT imaging of the lumbar spine was performed without intravenous contrast administration. Multiplanar CT image reconstructions were also generated. COMPARISON:  MRI 06/19/2017, CT 01/05/2021. FINDINGS: Segmentation: There are 4 non-rib-bearing lumbar type vertebrae, for the purposes of this dictation, numbering is done consistent with the prior MRI in 2018 and which the most inferior well-formed intervertebral disc is labeled as L5-S1. Alignment: Trace retrolisthesis at L3-L4. Straightening of the lumbar lordosis. Vertebrae: No evidence of acute fracture.  There is marked endplate irregularity on the  right at L3-L4 and on the left at L5-S1, similar to prior CT and March 2022, and progressed since the prior lumbar spine MRI in August 2018. This is favored to be degenerative endplate change. Paraspinal and other soft tissues: Negative. Disc levels: T12-L1: No significant spinal canal or neural foraminal narrowing. L1-L2: No significant spinal canal or neural foraminal narrowing. L2-L3: Arthropathy mild broad-based disc bulging and bilateral facet arthropathy results in mild spinal canal stenosis. No significant neural foraminal narrowing. L3-L4: Trace retrolisthesis with broad-based disc bulging and bilateral facet arthropathy, marked right-sided endplate irregularity. Moderate spinal canal stenosis. Mild bilateral neural foraminal narrowing. L4-L5: Severe disc height loss with posterior disc osteophyte complex and bilateral facet arthropathy results and moderate spinal canal stenosis and mild-to-moderate bilateral neural foraminal narrowing. L5-S1: Disc height loss with endplate irregularity on the left, bilateral facet arthropathy and asymmetric left disc bulging results and moderate bilateral neural foraminal narrowing. IMPRESSION: Multilevel degenerative changes of the lumbar spine, worst at L3-L4, L4-5, and L5-S1, as summarized below: L3-L4: Moderate spinal canal stenosis. Mild bilateral neural foraminal narrowing. Trace retrolisthesis and marked right-sided endplate irregularity. L4-L5: Moderate spinal canal stenosis. Mild-to-moderate bilateral neural foraminal narrowing. Severe disc height loss. L5-S1: Moderate bilateral neural foraminal narrowing. Electronically Signed   By: Maurine Simmering M.D.   On: 07/06/2021 15:30   CT PELVIS WO CONTRAST  Result Date: 07/06/2021 CLINICAL DATA:  Low back and bilateral lower extremity pain. EXAM: CT PELVIS WITHOUT CONTRAST TECHNIQUE: Multidetector CT imaging of the pelvis was performed following the standard protocol without  intravenous contrast. COMPARISON:  None. FINDINGS: Urinary Tract:  No abnormality visualized. Bowel:  Unremarkable visualized pelvic bowel loops. Vascular/Lymphatic: No pathologically enlarged lymph nodes. No significant vascular abnormality seen. Reproductive: Small calcified uterine fibroid is noted. No adnexal abnormalities noted. Other:  None. Musculoskeletal: Severe multilevel degenerative disc disease is noted in the visualized lumbar spine. No acute osseous abnormality is noted. No fracture is noted. Hip and sacroiliac joints are unremarkable. IMPRESSION: Severe multilevel degenerative disc disease is noted in the lower lumbar spine. No fracture or other acute osseous abnormalities noted. Small calcified uterine fibroid. Electronically Signed   By: Marijo Conception M.D.   On: 07/06/2021 14:45   CT KNEE LEFT WO CONTRAST  Result Date: 07/06/2021 CLINICAL DATA:  Severe lymphedema EXAM: CT OF THE left KNEE WITHOUT CONTRAST TECHNIQUE: Multidetector CT imaging of the left knee was performed according to the standard protocol. Multiplanar CT image reconstructions were also generated. COMPARISON:  Knee radiograph 07/01/2021 FINDINGS: Bones/Joint/Cartilage There is no evidence of acute fracture. There is tricompartment osteophyte formation with subchondral sclerosis and cystic change, with severe medial and patellofemoral joint space narrowing. Slight varus alignment. Small joint effusion. 4 mm ossified joint body is present within the suprapatellar joint space (series 1, image 48). Ligaments Suboptimally assessed by CT. Muscles and Tendons Mild generalized muscle atrophy. Soft tissues There is skin thickening and extensive subcutaneous soft tissue swelling. There is no well-defined or focal fluid collection. There is a 2.1 x 1.5 cm partially visualized soft tissue density lesion in the posteromedial thigh (series 1, image 1). IMPRESSION: No evidence of acute fracture. Tricompartment osteoarthritis, severe in the  medial and patellofemoral compartments. Small joint effusion with 4 mm ossified joint body within the suprapatellar joint space. Diffuse skin thickening and subcutaneous soft tissue swelling as can be seen in cellulitis or lymphedema. Partially visualized 2.1 x 1.5 cm soft tissue lesion in the posteromedial thigh subcutaneous tissues adjacent to a superficial vein. This could  potentially represent a lymph node, however recommend targeted ultrasound for further evaluation. Electronically Signed   By: Maurine Simmering M.D.   On: 07/06/2021 15:36   CT KNEE RIGHT WO CONTRAST  Result Date: 07/06/2021 CLINICAL DATA:  Lymphedema EXAM: CT OF THE right KNEE WITHOUT CONTRAST TECHNIQUE: Multidetector CT imaging of the right knee was performed according to the standard protocol. Multiplanar CT image reconstructions were also generated. COMPARISON:  None. FINDINGS: Bones/Joint/Cartilage There is no evidence of acute fracture. There is tricompartment osteophyte formation with subchondral cystic change and sclerosis. There is severe joint space narrowing. There is a moderate-sized joint effusion. There is flattening of the medial tibial plateau. There is a osteochondral defect along the inner posterior weight-bearing lateral femoral condyle measuring 9 mm in width (series 6, image 73). Ligaments Suboptimally assessed by CT. Muscles and Tendons Mild generalized muscle atrophy. Soft tissues There is diffuse skin thickening and subcutaneous edema. No focal fluid collection. IMPRESSION: No evidence of acute fracture. Severe tricompartment osteoarthritis of the right knee, with osteochondral defect along the posterior weight-bearing lateral femoral condyle and flattening of the medial tibial plateau. Moderate-sized joint effusion. Diffuse skin thickening and subcutaneous soft tissue swelling, as can be seen with cellulitis or lymphedema. No focal fluid collection. Electronically Signed   By: Maurine Simmering M.D.   On: 07/06/2021 15:40    CT SHOULDER RIGHT WO CONTRAST  Result Date: 07/08/2021 CLINICAL DATA:  Shoulder pain, traumatic (Ped 0-18y) anterior shoulder dislocation EXAM: CT OF THE UPPER RIGHT EXTREMITY WITHOUT CONTRAST TECHNIQUE: Multidetector CT imaging of the upper right extremity was performed according to the standard protocol. COMPARISON:  Right shoulder radiograph 07/07/2021, chest radiograph 07/01/2021, shoulder radiograph 06/28/2021, CT 02/17/2021 FINDINGS: Bones/Joint/Cartilage Unchanged acromion fracture and thinning of the undersurface of the acromion and distal clavicle. Anatomic right shoulder arthroplasty, which appears anterior inferiorly subluxed. There is glenoid bone loss with retroversion. There is a joint effusion, and subacromial-subdeltoid bursal distension. Ligaments Suboptimally assessed by CT. Muscles and Tendons Severe supraspinatus and infraspinatus muscle atrophy. Moderate subscapularis muscle atrophy. Soft tissues There is a fluid collection containing gas along the lateral proximal humerus measuring approximately 7.7 x 2.7 cm. This may connect with the bursa/glenohumeral joint. IMPRESSION: Anterior inferior subluxation of the anatomic shoulder arthroplasty with some loss of glenoid bone stock. Glenohumeral joint effusion and subacromial-subdeltoid bursal distension, with measurable fluid collection containing gas along the lateral aspect of the proximal humerus measuring 7.7 x 2.7 cm. This collection may connect with the bursa/joint. Unchanged acromion fracture and chronic thinning of the acromion and distal clavicle. Severe supraspinatus and infraspinatus muscle atrophy. Moderate subscapularis muscle atrophy. Electronically Signed   By: Maurine Simmering M.D.   On: 07/08/2021 14:24   DG Knee Complete 4 Views Left  Result Date: 07/01/2021 CLINICAL DATA:  Fall. EXAM: RIGHT KNEE - COMPLETE 4+ VIEW; LEFT KNEE - COMPLETE 4+ VIEW COMPARISON:  None. FINDINGS: Left knee: No acute fracture or malalignment. No joint  effusion. Tricompartmental joint space narrowing, moderate to severe in the medial and patellofemoral compartments, with large bulky marginal osteophytes. Soft tissues are unremarkable. Right knee: No acute fracture or malalignment. Small joint effusion. Severe tricompartmental joint space narrowing with bone-on-bone apposition in the medial compartment. Bulky marginal osteophytes. Soft tissues are unremarkable. IMPRESSION: 1. No acute osseous abnormality. 2. Severe right and moderate to severe left knee osteoarthritis. Electronically Signed   By: Titus Dubin M.D.   On: 07/01/2021 12:48   DG Knee Complete 4 Views Right  Result Date: 07/01/2021 CLINICAL  DATA:  Fall. EXAM: RIGHT KNEE - COMPLETE 4+ VIEW; LEFT KNEE - COMPLETE 4+ VIEW COMPARISON:  None. FINDINGS: Left knee: No acute fracture or malalignment. No joint effusion. Tricompartmental joint space narrowing, moderate to severe in the medial and patellofemoral compartments, with large bulky marginal osteophytes. Soft tissues are unremarkable. Right knee: No acute fracture or malalignment. Small joint effusion. Severe tricompartmental joint space narrowing with bone-on-bone apposition in the medial compartment. Bulky marginal osteophytes. Soft tissues are unremarkable. IMPRESSION: 1. No acute osseous abnormality. 2. Severe right and moderate to severe left knee osteoarthritis. Electronically Signed   By: Titus Dubin M.D.   On: 07/01/2021 12:48   DG Hip Unilat W or Wo Pelvis 2-3 Views Right  Result Date: 06/28/2021 CLINICAL DATA:  Right hip pain after fall today. EXAM: DG HIP (WITH OR WITHOUT PELVIS) 2-3V RIGHT COMPARISON:  None. FINDINGS: There is no evidence of hip fracture or dislocation. Mild narrowing and osteophyte formation is seen involving the right hip. IMPRESSION: Mild degenerative joint disease of the right hip. No acute abnormality is noted. Electronically Signed   By: Marijo Conception M.D.   On: 06/28/2021 14:09   VAS [place] UPPER  EXTREMITY VENOUS DUPLEX  Result Date: 07/07/2021 UPPER VENOUS STUDY  Patient Name:  CHINELO BENN  Date of Exam:   07/07/2021 Medical Rec #: [id number]          Accession #:    [id number] Date of Birth: 1946-10-10          Patient Gender: F Patient Age:   38 years [facility]am [facility] Procedure:      VAS [place] UPPER EXTREMITY VENOUS DUPLEX Referring Phys: Shawna Clamp --------------------------------------------------------------------------------  Indications: Edema, and Erythema Limitations: Body habitus, poor ultrasound/tissue interface and patient position. Comparison Study: No prior study Performing Technologist: Maudry Mayhew MHA, RDMS, RVT, RDCS  Examination Guidelines: A complete evaluation includes B-mode imaging, spectral Doppler, color Doppler, and power Doppler as needed of all accessible portions of each vessel. Bilateral testing is considered an integral part of a complete examination. Limited examinations for reoccurring indications may be performed as noted.  Right Findings: +----------+------------+---------+-----------+----------+--------------+ RIGHT     CompressiblePhasicitySpontaneousProperties   Summary     +----------+------------+---------+-----------+----------+--------------+ IJV           Full       Yes       Yes                             +----------+------------+---------+-----------+----------+--------------+ Subclavian    Full       Yes       Yes                             +----------+------------+---------+-----------+----------+--------------+ Axillary      Full       Yes       Yes                             +----------+------------+---------+-----------+----------+--------------+ Brachial      Full       Yes       Yes                             +----------+------------+---------+-----------+----------+--------------+ Radial        Full                                                  +----------+------------+---------+-----------+----------+--------------+  Ulnar                                               Not visualized +----------+------------+---------+-----------+----------+--------------+ Cephalic      Full                                                 +----------+------------+---------+-----------+----------+--------------+ Basilic       Full                                                 +----------+------------+---------+-----------+----------+--------------+  Summary:  Right: No evidence of deep vein thrombosis in the upper extremity. However, unable to visualize the ulnar veins.  *See table(s) above for measurements and observations.  Diagnosing physician: Harold Barban MD Electronically signed by Harold Barban MD on 07/07/2021 at 6:32:26 PM.    Final     Labs:  CBC: Recent Labs    07/11/21 0510 07/12/21 0437 07/13/21 0421 07/14/21 0450  WBC 6.1 6.7 5.5 5.9  HGB 9.9* 9.6* 9.4* 9.3*  HCT 31.1* 30.0* 29.9* 29.4*  PLT 251 245 239 221    COAGS: Recent Labs    03/17/21 1205 03/18/21 0324 07/12/21 0437 07/13/21 0421 07/14/21 0450 07/15/21 0431  INR 2.1*   < > 3.0* 3.4* 2.8* 2.4*  APTT 29  --   --   --   --   --    < > = values in this interval not displayed.    BMP: Recent Labs    07/09/21 0517 07/10/21 0818 07/13/21 0421 07/14/21 0450  NA 135 137 136 136  K 5.0 3.9 3.2* 3.5  CL 95* 97* 94* 94*  CO2 30 33* 37* 36*  GLUCOSE 99 96 97 105*  BUN 37* 33* 32* 33*  CALCIUM 9.6 10.2 9.9 10.2  CREATININE 1.44* 1.28* 1.42* 1.51*  GFRNONAA 38* 44* 39* 36*    LIVER FUNCTION TESTS: Recent Labs    07/03/21 0541 07/04/21 0527 07/05/21 0430 07/06/21 0515  BILITOT 3.6* 3.2* 3.1* 3.2*  AST 13* 27 34 49*  ALT 14 17 20 27   ALKPHOS 140* 230* 235* 317*  PROT 7.1 6.7 6.3* 6.3*  ALBUMIN 2.7* 2.4* 2.4* 2.2*    TUMOR MARKERS: No results for input(s): AFPTM, CEA, CA199, CHROMGRNA in the last 8760 hours.  Assessment and  Plan: Shoulder fluid collection Patient with right should fluid collection.  Palpable fluctuant, abscess at the distal portion of her surgical incision.  Per Ortho note "may possibly need a surgical washout if there are concerning signs of infection with aspiration of the joint."  Case reviewed by Dr. Kathlene Cote who notes there is no definitive joint extension, however collection is amenable to aspiration and drainage.  Patient assessed at bedside today.  Her son is highly involved in all of her care.  He is not available today to discuss the procedure.  Will reattempt conversation and consent tomorrow.  NPO p MN.   Thank you for this interesting consult.  I greatly enjoyed meeting Katelyn Jackson and look forward to participating in their care.  A copy of this report was  sent to the requesting provider on this date.  Electronically Signed: Docia Barrier, PA 07/15/2021, 4:38 PM   I spent a total of 40 Minutes    in face to face in clinical consultation, greater than 50% of which was counseling/coordinating care for right shoulder fluid collection.

## 2021-07-15 NOTE — Progress Notes (Signed)
Pharmacy Antibiotic Note  Katelyn Jackson is a 75 y.o. female admitted on 07/01/2021 with cellulitis.  Pharmacy has been consulted for vancomycin dosing.  Pt has been on doxycycline 100 mg PO BID with no improvement. Therefore antibiotic is being changed to vancomycin.   IR is also being consulted to evaluate for shoulder fluid collection   Plan: Vancomycin 2000 mg IV x1 load, then 750 mg IV q24h  Monitor clinical course, renal function, cultures as available   Height: 5\' 1"  (154.9 cm) Weight: 111 kg (244 lb 11.4 oz) IBW/kg (Calculated) : 47.8  Temp (24hrs), Avg:99.4 F (37.4 C), Min:99.1 F (37.3 C), Max:99.7 F (37.6 C)  Recent Labs  Lab 07/09/21 0517 07/10/21 0818 07/11/21 0510 07/12/21 0437 07/13/21 0421 07/14/21 0450  WBC 7.4 7.2 6.1 6.7 5.5 5.9  CREATININE 1.44* 1.28*  --   --  1.42* 1.51*    Estimated Creatinine Clearance: 37.1 mL/min (A) (by C-G formula based on SCr of 1.51 mg/dL (H)).    Allergies  Allergen Reactions   Chlorhexidine Gluconate Hives   Penicillins Itching    Has patient had a PCN reaction causing immediate rash, facial/tongue/throat swelling, SOB or lightheadedness with hypotension: no Has patient had a PCN reaction causing severe rash involving mucus membranes or skin necrosis: No Has patient had a PCN reaction that required hospitalization: No Has patient had a PCN reaction occurring within the last 10 years: No If all of the above answers are "NO", then may proceed with Cephalosporin use.  Tolerated Cephalosporin Date: 03/17/21.     8/26 CTX >> Cefadroxil 8/29 >> 8/31 9/3 doxy for abscess >> 9/8  9/8 vancomycin >>   8/25 UCx: multi species FINAL  Thank you for allowing pharmacy to be a part of this patient's care.   Royetta Asal, PharmD, BCPS 07/15/2021 12:43 PM

## 2021-07-16 ENCOUNTER — Inpatient Hospital Stay (HOSPITAL_COMMUNITY): Payer: Medicare HMO

## 2021-07-16 DIAGNOSIS — L03116 Cellulitis of left lower limb: Secondary | ICD-10-CM | POA: Diagnosis not present

## 2021-07-16 HISTORY — PX: IR US GUIDE BX ASP/DRAIN: IMG2392

## 2021-07-16 LAB — PROTIME-INR
INR: 2.9 — ABNORMAL HIGH (ref 0.8–1.2)
Prothrombin Time: 30.5 seconds — ABNORMAL HIGH (ref 11.4–15.2)

## 2021-07-16 LAB — COMPREHENSIVE METABOLIC PANEL
ALT: 21 U/L (ref 0–44)
AST: 26 U/L (ref 15–41)
Albumin: 2.1 g/dL — ABNORMAL LOW (ref 3.5–5.0)
Alkaline Phosphatase: 373 U/L — ABNORMAL HIGH (ref 38–126)
Anion gap: 9 (ref 5–15)
BUN: 30 mg/dL — ABNORMAL HIGH (ref 8–23)
CO2: 33 mmol/L — ABNORMAL HIGH (ref 22–32)
Calcium: 9.8 mg/dL (ref 8.9–10.3)
Chloride: 91 mmol/L — ABNORMAL LOW (ref 98–111)
Creatinine, Ser: 1.36 mg/dL — ABNORMAL HIGH (ref 0.44–1.00)
GFR, Estimated: 41 mL/min — ABNORMAL LOW (ref >60)
Glucose, Bld: 95 mg/dL (ref 70–99)
Potassium: 3.1 mmol/L — ABNORMAL LOW (ref 3.5–5.1)
Sodium: 133 mmol/L — ABNORMAL LOW (ref 135–145)
Total Bilirubin: 2.8 mg/dL — ABNORMAL HIGH (ref 0.3–1.2)
Total Protein: 6.1 g/dL — ABNORMAL LOW (ref 6.5–8.1)

## 2021-07-16 LAB — RPR: RPR Ser Ql: NONREACTIVE

## 2021-07-16 LAB — CBC
HCT: 29.2 % — ABNORMAL LOW (ref 36.0–46.0)
Hemoglobin: 9.2 g/dL — ABNORMAL LOW (ref 12.0–15.0)
MCH: 29.7 pg (ref 26.0–34.0)
MCHC: 31.5 g/dL (ref 30.0–36.0)
MCV: 94.2 fL (ref 80.0–100.0)
Platelets: 205 10*3/uL (ref 150–400)
RBC: 3.1 MIL/uL — ABNORMAL LOW (ref 3.87–5.11)
RDW: 16.3 % — ABNORMAL HIGH (ref 11.5–15.5)
WBC: 5.4 10*3/uL (ref 4.0–10.5)
nRBC: 0 % (ref 0.0–0.2)

## 2021-07-16 LAB — FOLATE: Folate: 9.2 ng/mL (ref >5.9)

## 2021-07-16 LAB — VITAMIN B12: Vitamin B-12: 5766 pg/mL — ABNORMAL HIGH (ref 180–914)

## 2021-07-16 LAB — AMMONIA: Ammonia: 31 umol/L (ref 9–35)

## 2021-07-16 LAB — MAGNESIUM: Magnesium: 2.3 mg/dL (ref 1.7–2.4)

## 2021-07-16 MED ORDER — MIDAZOLAM HCL 2 MG/2ML IJ SOLN
INTRAMUSCULAR | Status: DC | PRN
  Administered 2021-07-16: 1 mg via INTRAVENOUS

## 2021-07-16 MED ORDER — POTASSIUM CHLORIDE CRYS ER 20 MEQ PO TBCR
40.0000 meq | EXTENDED_RELEASE_TABLET | Freq: Once | ORAL | Status: AC
  Administered 2021-07-16: 40 meq via ORAL
  Filled 2021-07-16: qty 2

## 2021-07-16 MED ORDER — FENTANYL CITRATE (PF) 100 MCG/2ML IJ SOLN
INTRAMUSCULAR | Status: AC
  Filled 2021-07-16: qty 2

## 2021-07-16 MED ORDER — LIDOCAINE HCL 1 % IJ SOLN
INTRAMUSCULAR | Status: AC
  Filled 2021-07-16: qty 20

## 2021-07-16 MED ORDER — WARFARIN SODIUM 1 MG PO TABS
1.0000 mg | ORAL_TABLET | Freq: Once | ORAL | Status: AC
  Administered 2021-07-16: 1 mg via ORAL
  Filled 2021-07-16: qty 1

## 2021-07-16 MED ORDER — FENTANYL CITRATE (PF) 100 MCG/2ML IJ SOLN
INTRAMUSCULAR | Status: DC | PRN
  Administered 2021-07-16: 50 ug via INTRAVENOUS

## 2021-07-16 MED ORDER — LIDOCAINE HCL (PF) 1 % IJ SOLN
INTRAMUSCULAR | Status: DC | PRN
  Administered 2021-07-16: 10 mL via INTRADERMAL

## 2021-07-16 MED ORDER — MIDAZOLAM HCL 2 MG/2ML IJ SOLN
INTRAMUSCULAR | Status: AC
  Filled 2021-07-16: qty 2

## 2021-07-16 NOTE — Plan of Care (Signed)
  Problem: Clinical Measurements: Goal: Will remain free from infection Outcome: Progressing   Problem: Activity: Goal: Risk for activity intolerance will decrease Outcome: Progressing   Problem: Nutrition: Goal: Adequate nutrition will be maintained Outcome: Progressing   Problem: Pain Managment: Goal: General experience of comfort will improve Outcome: Progressing   Problem: Skin Integrity: Goal: Risk for impaired skin integrity will decrease Outcome: Progressing   

## 2021-07-16 NOTE — Progress Notes (Signed)
Pt seen again. See full consult note from yesterday. Son at bedside, able to interpret.  Pt agreeable to proceed with image guided aspiration/drainage. Consent signed in chart  Ascencion Dike PA-C Interventional Radiology 07/16/2021 12:48 PM

## 2021-07-16 NOTE — Procedures (Signed)
Interventional Radiology Procedure Note  Procedure: Ultrasound guided aspiration of right upper arm abscess; fluoro guided aspiration of right shoulder joint   Complications: None  Estimated Blood Loss: None  Findings: Under US guidance, 18 G trocar needle advanced into right upper arm complex fluid collection. Deeper component yielded no fluid. More superficial component yielded 5 mL of bloody fluid mixed with purulent debris. Sample sent for culture. No indication for a smaller caliber drain given nature of fluid with very little return.  Under fluoro guidance, 18 G spinal needle advanced to 2 different portions of right shoulder hemiarthroplasty at level of glenohumeral joint yielding only few mL of bloody fluid. Sample sent for separate culture.  Venetia Night. Kathlene Cote, M.D Pager:  [phone]

## 2021-07-16 NOTE — Progress Notes (Signed)
PROGRESS NOTE    Katelyn Jackson  YQM:[id number] DOB: 1945/11/14 DOA: 07/01/2021 PCP: Cassandria Anger, MD   Brief Narrative: 75 years old female with PMH significant for permanent A. fib on Coumadin, hypertension, hyperlipidemia, pacemaker in place, history of breast cancer, hypothyroidism, chronic bilateral lymphedema, recurrent falls, morbid obesity, BMI 51, GERD, pulmonary hypertension who needs full assistance for daily life activities, She has home health aide 6 times a week at home,  brought to the ED by her son for the evaluation of recent fall on last Monday.  Patient fell from bed on Monday since then complaining about left shoulder pain,  left ankle pain and bilateral knee pain and unable to walk or stand, go to the bathroom and progressively declining. She was found to have left lower extremity cellulitis.  Patient completed antibiotics.  Cellulitis has resolved.  Patient was given vitamin K due to supratherapeutic INR.  She was also given Lasix for CHF exacerbation.  She was managed with IV antibiotics for cellulitis,  IV Lasix for CHF.  ABG showed CO2 retention-chronic hypercapnia compensated.  Assessment & Plan:   Principal Problem:   Cellulitis of left lower extremity Active Problems:   Essential hypertension   Chronic atrial fibrillation (HCC)   Lymphedema   Anemia of chronic disease   CRF (chronic renal failure), stage 4 (severe) (HCC)   Morbid obesity (HCC)   GERD (gastroesophageal reflux disease)   Hypothyroidism   Supratherapeutic INR   Pulmonary edema   Pressure injury of skin  Cellulitis of left lower extremity/ Chronic bilateral lymphedema: Completed antibiotics.  Erythema and warmth has resolved.   Recurrent falls / Ambulatory dysfunction / moderate to severe osteoarthritis: Patient had complete skeletal survey.  X-ray of the knees and left ankle: No fracture Right hip: Degenerative disc pain.  Patient has chronic pain for years. Dr. Para March orthopedics  was consulted.  Adequate pain control with oral and IV opiates. Despite the need for bilateral total knee replacements,  She is certainly not a surgical candidate from cardiac, renal and body habitus standpoint. PT and OT recommended  skilled nursing facility.   Acute hypoxic respiratory failure could be multifactorial:  > Resolved. secondary to fluid overload,  acute on chronic systolic CHF, hypercapnia likely from OSA. Respiratory status much improved.  Patient had adequate diuresis with negative fluid balance. Continue supplemental oxygen to keep saturation above 94%. Diuretics changed to torsemide po 40 mg twice daily. She is now 93% on room air.   AKI on CKD stage IIIb: Baseline serum creatinine 1.4-1.6 Creatinine improved with torsemide and  remained stable. Avoid nephrotoxic medications.   Essential hypertension: Blood pressure 121/62 decrease the dose of Coreg   Chronic atrial fibrillation: Continue Coreg, heart rate controlled. Coumadin was on hold because of supratherapeutic INR.   INR today is 2.8 Restart Coumadin   Pacemaker in place: Stable.  Hypothyroidism: TSH 2.7 on 07/02/2021 Continue levothyroxine.   Anemia of chronic disease: Hemoglobin remained stable, no obvious bleeding.   History of breast cancer: Outpatient follow-up.   Right shoulder pain and swelling: Venous duplex ruled out DVT. Right shoulder x-ray and CT scan showed  chronic anterior shoulder dislocation. Will change antibiotics to vancomycin since no improvement with doxycycline.  Patient has a stitch abscess in the right shoulder for which she is going to IR  for incision drainage and culture of the drainage. IR was unable to reach patient's son yesterday for consent.    Encephalopathy/changes in mental status family concerned this is not  her baseline.  Patient is much more awake and alert today she reports that she is feeling better. ABG unremarkable Hold any sedatives or narcotics B12  5766 Folate 9.2  Rpr non reactive  ammonia normal   Pressure Injury 07/03/21 Thigh Posterior;Right Stage 3 -  Full thickness tissue loss. Subcutaneous fat may be visible but bone, tendon or muscle are NOT exposed. (Active)  07/03/21 0453  Location: Thigh  Location Orientation: Posterior;Right  Staging: Stage 3 -  Full thickness tissue loss. Subcutaneous fat may be visible but bone, tendon or muscle are NOT exposed.  Wound Description (Comments):   Present on Admission: Yes     Pressure Injury 07/03/21 Sacrum Stage 2 -  Partial thickness loss of dermis presenting as a shallow open injury with a red, pink wound bed without slough. (Active)  07/03/21 2105  Location: Sacrum  Location Orientation:   Staging: Stage 2 -  Partial thickness loss of dermis presenting as a shallow open injury with a red, pink wound bed without slough.  Wound Description (Comments):   Present on Admission: Yes    Estimated body mass index is 47.32 kg/m as calculated from the following:   Height as of this encounter: 5\' 1"  (1.549 m).   Weight as of this encounter: 113.6 kg.  DVT prophylaxis: Coumadin  code Status: Full code Family Communication: None at bedside  disposition Plan:  Status is: Inpatient  Remains inpatient appropriate because:Hemodynamically unstable  Dispo: The patient is from: Home              Anticipated d/c is to: SNF              Patient currently is not medically stable to d/c.   Difficult to place patient No       Consultants:  Ortho  Procedures: None Antimicrobials: Doxycycline  Subjective: She is resting in bed.  Staff was concerned if the stitch abscess in the right shoulder area is getting more erythematous edematous and painful with very limited movement to the right shoulder.  Family is also concerned that she is more confused than her baseline.  Objective: Vitals:   07/15/21 1126 07/16/21 0012 07/16/21 0459 07/16/21 0500  BP: 124/63 (!) 131/55 119/67   Pulse:  (!) 57 (!) 58 71   Resp: (!) 24 18 18    Temp: 99.1 F (37.3 C) 98.8 F (37.1 C) 98.4 F (36.9 C)   TempSrc: Oral Oral Oral   SpO2: 90% 93% 94%   Weight:    113.6 kg  Height:        Intake/Output Summary (Last 24 hours) at 07/16/2021 1151 Last data filed at 07/16/2021 0945 Gross per 24 hour  Intake 760 ml  Output 4400 ml  Net -3640 ml    Filed Weights   07/13/21 0542 07/15/21 0530 07/16/21 0500  Weight: 111.7 kg 111 kg 113.6 kg    Examination:  General exam: Appears calm and comfortable  Respiratory system: Clear to auscultation. Respiratory effort normal. Cardiovascular system: S1 & S2 heard, RRR. No JVD, murmurs, rubs, gallops or clicks.. Gastrointestinal system: Abdomen is nondistended, soft and nontender. No organomegaly or masses felt. Normal bowel sounds heard. Central nervous system: Alert and oriented. No focal neurological deficits. Extremities: Bilateral lower extremity and right upper extremity edema. Skin: No rashes, lesions or ulcers Psychiatry: Difficult to assess due to language barrier   Data Reviewed: I have personally reviewed following labs and imaging studies  CBC: Recent Labs  Lab 07/11/21 0510 07/12/21 0437  07/13/21 0421 07/14/21 0450 07/16/21 0431  WBC 6.1 6.7 5.5 5.9 5.4  HGB 9.9* 9.6* 9.4* 9.3* 9.2*  HCT 31.1* 30.0* 29.9* 29.4* 29.2*  MCV 93.1 93.5 93.4 93.9 94.2  PLT 251 245 239 221 956    Basic Metabolic Panel: Recent Labs  Lab 07/10/21 0818 07/13/21 0421 07/14/21 0450 07/16/21 0431  NA 137 136 136 133*  K 3.9 3.2* 3.5 3.1*  CL 97* 94* 94* 91*  CO2 33* 37* 36* 33*  GLUCOSE 96 97 105* 95  BUN 33* 32* 33* 30*  CREATININE 1.28* 1.42* 1.51* 1.36*  CALCIUM 10.2 9.9 10.2 9.8  MG  --  2.4  --   --   PHOS  --  3.2  --   --     GFR: Estimated Creatinine Clearance: 41.8 mL/min (A) (by C-G formula based on SCr of 1.36 mg/dL (H)). Liver Function Tests: Recent Labs  Lab 07/16/21 0431  AST 26  ALT 21  ALKPHOS 373*  BILITOT 2.8*   PROT 6.1*  ALBUMIN 2.1*   No results for input(s): LIPASE, AMYLASE in the last 168 hours. Recent Labs  Lab 07/16/21 0431  AMMONIA 31   Coagulation Profile: Recent Labs  Lab 07/12/21 0437 07/13/21 0421 07/14/21 0450 07/15/21 0431 07/16/21 0431  INR 3.0* 3.4* 2.8* 2.4* 2.9*    Cardiac Enzymes: No results for input(s): CKTOTAL, CKMB, CKMBINDEX, TROPONINI in the last 168 hours. BNP (last 3 results) No results for input(s): PROBNP in the last 8760 hours. HbA1C: No results for input(s): HGBA1C in the last 72 hours. CBG: Recent Labs  Lab 07/13/21 0202  GLUCAP 100*    Lipid Profile: No results for input(s): CHOL, HDL, LDLCALC, TRIG, CHOLHDL, LDLDIRECT in the last 72 hours. Thyroid Function Tests: No results for input(s): TSH, T4TOTAL, FREET4, T3FREE, THYROIDAB in the last 72 hours. Anemia Panel: Recent Labs    07/16/21 0431  VITAMINB12 5,766*  FOLATE 9.2   Sepsis Labs: No results for input(s): PROCALCITON, LATICACIDVEN in the last 168 hours.  Recent Results (from the past 240 hour(s))  Resp Panel by RT-PCR (Flu A&B, Covid) Nasopharyngeal Swab     Status: None   Collection Time: 07/13/21  6:05 PM   Specimen: Nasopharyngeal Swab; Nasopharyngeal(NP) swabs in vial transport medium  Result Value Ref Range Status   SARS Coronavirus 2 by RT PCR NEGATIVE NEGATIVE Final    Comment: (NOTE) SARS-CoV-2 target nucleic acids are NOT DETECTED.  The SARS-CoV-2 RNA is generally detectable in upper respiratory specimens during the acute phase of infection. The lowest concentration of SARS-CoV-2 viral copies this assay can detect is 138 copies/mL. A negative result does not preclude SARS-Cov-2 infection and should not be used as the sole basis for treatment or other patient management decisions. A negative result may occur with  improper specimen collection/handling, submission of specimen other than nasopharyngeal swab, presence of viral mutation(s) within the areas targeted  by this assay, and inadequate number of viral copies(<138 copies/mL). A negative result must be combined with clinical observations, patient history, and epidemiological information. The expected result is Negative.  Fact Sheet for Patients:  EntrepreneurPulse.com.au  Fact Sheet for Healthcare Providers:  IncredibleEmployment.be  This test is no t yet approved or cleared by the [place] FDA and  has been authorized for detection and/or diagnosis of SARS-CoV-2 by FDA under an Emergency Use Authorization (EUA). This EUA will remain  in effect (meaning this test can be used) for the duration of the COVID-19 declaration under Section 564(b)(1)  of the Act, 21 U.S.C.section 360bbb-3(b)(1), unless the authorization is terminated  or revoked sooner.       Influenza A by PCR NEGATIVE NEGATIVE Final   Influenza B by PCR NEGATIVE NEGATIVE Final    Comment: (NOTE) The Xpert Xpress SARS-CoV-2/FLU/RSV plus assay is intended as an aid in the diagnosis of influenza from Nasopharyngeal swab specimens and should not be used as a sole basis for treatment. Nasal washings and aspirates are unacceptable for Xpert Xpress SARS-CoV-2/FLU/RSV testing.  Fact Sheet for Patients: EntrepreneurPulse.com.au  Fact Sheet for Healthcare Providers: IncredibleEmployment.be  This test is not yet approved or cleared by the [place] FDA and has been authorized for detection and/or diagnosis of SARS-CoV-2 by FDA under an Emergency Use Authorization (EUA). This EUA will remain in effect (meaning this test can be used) for the duration of the COVID-19 declaration under Section 564(b)(1) of the Act, 21 U.S.C. section 360bbb-3(b)(1), unless the authorization is terminated or revoked.  Performed at [facility], [place] [address], [place], [place] [address]           Radiology Studies: No results  found.      Scheduled Meds:  carvedilol  6.25 mg Oral BID WC   cholecalciferol  2,000 Units Oral Daily   exemestane  25 mg Oral QPC breakfast   levothyroxine  50 mcg Oral QAC breakfast   magnesium oxide  400 mg Oral BID   pantoprazole  40 mg Oral Daily   pravastatin  20 mg Oral q1800   torsemide  40 mg Oral BID   vitamin B-12  2,000 mcg Oral Daily   warfarin  1 mg Oral ONCE-1600   Warfarin - Pharmacist Dosing Inpatient   Does not apply q1600   Continuous Infusions:  sodium chloride 250 mL (07/04/21 0822)   vancomycin       LOS: 14 days    Time spent: 39 min    Georgette Shell, MD  07/16/2021, 11:51 AM

## 2021-07-16 NOTE — Plan of Care (Signed)
  Problem: Pain Managment: Goal: General experience of comfort will improve Outcome: Progressing   Problem: Skin Integrity: Goal: Risk for impaired skin integrity will decrease Outcome: Progressing   

## 2021-07-16 NOTE — Progress Notes (Addendum)
   ORTHOPAEDIC PROGRESS NOTE  SUBJECTIVE: Patient scheduled for image guided aspiration and drainage with IR today. Son at bedside  OBJECTIVE: PE: General: lethargic, NAD Right shoulder: there is a bullae at the distal aspect of the patient's old incision site, there is surrounding erythema and induration, she reports tenderness here. She endorses distal sensation. Warm well perfused hand.    Vitals:   07/16/21 1615 07/16/21 1620  BP: (!) 119/59 107/60  Pulse: 62 63  Resp: (!) 21 20  Temp:    SpO2: 100% 100%     ASSESSMENT: Katelyn Jackson is a 75 y.o. 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 She had a right hemiarthroplasty in 03/2021. Appearance of her shoulder is chronic. She does have increasing redness and a new bullae on the distal aspect of her old incision.  She has been on doxycycline with no improvement.   - IR planning for image guided aspiration/drainage lateral today  - We will continue to follow cultures  - may need surgical washout depending on clinical improvement after aspiration and culture results  Would benefit from skilled PT and OT for mobility purposes at a SNF as she could not feed herself or mobilize herself within the bed much less transferring out of bed.    [name], PA-C 07/16/2021

## 2021-07-16 NOTE — Progress Notes (Signed)
[facility] for  warfarin Indication:  hx atrial fibrillation  Allergies  Allergen Reactions   Chlorhexidine Gluconate Hives   Penicillins Itching    Has patient had a PCN reaction causing immediate rash, facial/tongue/throat swelling, SOB or lightheadedness with hypotension: no Has patient had a PCN reaction causing severe rash involving mucus membranes or skin necrosis: No Has patient had a PCN reaction that required hospitalization: No Has patient had a PCN reaction occurring within the last 10 years: No If all of the above answers are "NO", then may proceed with Cephalosporin use.  Tolerated Cephalosporin Date: 03/17/21.    Patient Measurements: Height: 5\' 1"  (154.9 cm) Weight: 113.6 kg (250 lb 7.1 oz) IBW/kg (Calculated) : 47.8   Vital Signs: Temp: 98.4 F (36.9 C) (09/09 0459) Temp Source: Oral (09/09 0459) BP: 119/67 (09/09 0459) Pulse Rate: 71 (09/09 0459)  Labs: Recent Labs    07/14/21 0450 07/15/21 0431 07/16/21 0431  HGB 9.3*  --  9.2*  HCT 29.4*  --  29.2*  PLT 221  --  205  LABPROT 29.7* 26.1* 30.5*  INR 2.8* 2.4* 2.9*  CREATININE 1.51*  --  1.36*    Estimated Creatinine Clearance: 41.8 mL/min (A) (by C-G formula based on SCr of 1.36 mg/dL (H)).  Medications:  - on warfarin 2.5mg  daily except 5mg  on Tue PTA (last dose taken on 8/24)  Assessment: Patient is a 75 y.o F with hx afib on warfarin PTA, presented to the ED on 8/25 with failure to thrive, She also reported of a fall one week ago and reports poor oral intake afterwards. Head CT was negative for any acute abnormality.  Pharmacy has been consulted to dose warfarin while she's hospitalized.  - Vitamin K 2.5mg  PO x 1 given on 8/26 and 8/29 for elevated INR - Warfarin resumed 8/30  Today, 07/16/2021: INR therapeutic but rising (no warfarin given x 4 days then reduced dose of 2mg  given 9/5 then no warf again 9/6, 2.5 mg given 9/7 and 9/8) CBC: Hgb low but  stable at 9.3, Pltc WNL Eating only ~10% of meals for most part as per charting  Goal of Therapy:  INR 2-3 Monitor platelets by anticoagulation protocol: Yes   Plan:  Only 1mg  warfarin today Daily PT/INR Monitor CBC and for s/sx of bleeding    Adrian Saran, PharmD, BCPS Secure Chat if ?s 07/16/2021 8:45 AM

## 2021-07-16 NOTE — TOC Progression Note (Signed)
Transition of Care [facility]) - Progression Note    Patient Details  Name: Katelyn Jackson MRN: [id number] Date of Birth: [birth year]  Transition of Care [facility]) CM/SW Contact  Chea Malan, Juliann Pulse, RN Phone Number: 07/16/2021, 3:40 PM  Clinical Narrative: Not medically stable to start [place] [facility].      Expected Discharge Plan: Skilled Nursing Facility Barriers to Discharge: Continued Medical Work up  Expected Discharge Plan and Services Expected Discharge Plan: [place]                                               Social Determinants of Health (SDOH) Interventions    Readmission Risk Interventions Readmission Risk Prevention Plan 07/06/2021 03/19/2021  Transportation Screening Complete Complete  HRI or [place] - Complete  Social Work Consult for [place] Planning/Counseling - Complete  Palliative Care Screening - Not Applicable  Medication Review [employer] photographer) - Complete  HRI or Home Care Consult Complete -  SW Recovery Care/Counseling Consult Complete -  Palliative Care Screening Not Applicable -  Skilled Nursing Facility Complete -  Some recent data might be hidden

## 2021-07-16 NOTE — Progress Notes (Signed)
Occupational Therapy Treatment Patient Details Name: Katelyn Jackson MRN: [id number] DOB: 07/01/1946 Today's Date: 07/16/2021    History of present illness Katelyn Jackson is a 74 y.o. female brought by son to the ER for further evaluation of recent fall. Patient's son at the bedside is the historian:.  He tells me that patient fell from the bed on Monday and since then she is complaining of left shoulder pain, left ankle pain and bilateral knee pain, she is unable to walk, stand, go to bathroom and progressively declining. PMH: permanent A. fib, HTN, hyperlipidemia, s/p pacemaker placement, breast cancer, hypothyroidism, chronic bilateral lymphedema, recurrent falls, obesity, GERD, pulmonary hypertension   OT comments  Treatment focused on improving patient's participation with bed mobility, education and instruction on exercises/activities to perform in bed to promote strength, education in regards to barriers to progress and activity tolerance. Patient max x 2 to transfer to side of bed, brushed hair and performed knee extension reps at edge of bed. Attempts to stand with walker very limited due to pain and patient reacting to pain in bilateral feet with minimal weight bearing. Only able to get patient' butt off the bed approx 1 inch with max x 2 assist before she sat back down and began extending. Patient total assist to return to supine quickly due to proximity to the edge of the bed.  Patient has made progress from evaluation. Despite pain patient is motivated to return to PLOF. Continue to recommend short term rehab at discharge.     Follow Up Recommendations  SNF    Equipment Recommendations  Other (comment) (TBD)    Recommendations for Other Services      Precautions / Restrictions Precautions Precautions: Fall Precaution Comments: leg pain, right shoulder TSA now painful Restrictions Weight Bearing Restrictions: No Other Position/Activity Restrictions: speaks russian        Mobility Bed Mobility Overal bed mobility: Needs Assistance Bed Mobility: Supine to Sit;Sit to Supine     Supine to sit: +2 for physical assistance;+2 for safety/equipment;Max assist     General bed mobility comments: Max assist x 2 - patient attempting to move LEs though severely limited from pain and weakness. Total assist to return to supine with screaming/crying in panii    Transfers Overall transfer level: Needs assistance Equipment used: Rolling walker (2 wheeled) Transfers: Sit to/from Stand Sit to Stand: Max assist;+2 physical assistance;+2 safety/equipment;From elevated surface         General transfer comment: Attempted sit to stand with bariwalker from elevated bed height. Only able to liff buttocks from bed approx 1 inch before patient reacted to pain in bilateral feet with weight bearing - patient extending back and yelling. Patient returned to supine with total assist due to proximity to edge of bed.    Balance Overall balance assessment: Needs assistance Sitting-balance support: No upper extremity supported Sitting balance-Leahy Scale: Fair Sitting balance - Comments: able to sit edge of bed without upper extremity support                                   ADL either performed or assessed with clinical judgement   ADL Overall ADL's : Needs assistance/impaired     Grooming: Sitting;Brushing hair Grooming Details (indicate cue type and reason): able to brush her hair with left hand at edge of bed  Vision Patient Visual Report: No change from baseline     Perception     Praxis      Cognition Arousal/Alertness: Awake/alert Behavior During Therapy: WFL for tasks assessed/performed Overall Cognitive Status: Within Functional Limits for tasks assessed                                 General Comments: Patient able to understand some english but son there to intepret as  well.        Exercises Other Exercises Other Exercises: knee extension reps at edge of bed 6 reps each side   Shoulder Instructions       General Comments      Pertinent Vitals/ Pain       Pain Assessment: Faces Faces Pain Scale: Hurts whole lot Pain Location: generalized but the most in right knee and bil feet Pain Descriptors / Indicators: Grimacing;Moaning;Guarding (yelping, cries out) Pain Intervention(s): Premedicated before session;Limited activity within patient's tolerance;Repositioned  Home Living                                          Prior Functioning/Environment              Frequency  Min 2X/week        Progress Toward Goals  OT Goals(current goals can now be found in the care plan section)  Progress towards OT goals: Progressing toward goals  Acute Rehab OT Goals Patient Stated Goal: to stand and walk again OT Goal Formulation: With patient Time For Goal Achievement: 07/21/21 Potential to Achieve Goals: [place] Discharge plan remains appropriate    Co-evaluation          OT goals addressed during session: Strengthening/ROM;Other (comment) (functional mobility)      AM-PAC OT "6 Clicks" Daily Activity     Outcome Measure   Help from another person eating meals?: A Little Help from another person taking care of personal grooming?: A Little Help from another person toileting, which includes using toliet, bedpan, or urinal?: Total Help from another person bathing (including washing, rinsing, drying)?: A Lot Help from another person to put on and taking off regular upper body clothing?: A Lot Help from another person to put on and taking off regular lower body clothing?: Total 6 Click Score: 12    End of Session Equipment Utilized During Treatment: Gait belt;Rolling walker  OT Visit Diagnosis: Muscle weakness (generalized) (M62.81);Pain   Activity Tolerance Patient limited by pain   Patient Left in bed;with  call bell/phone within reach;with family/visitor present;with bed alarm set   Nurse Communication Mobility status        Time: 1132-1203 OT Time Calculation (min): 31 min  Charges: OT General Charges $OT Visit: 1 Visit OT Treatments $Therapeutic Activity: 8-22 mins $Therapeutic Exercise: 8-22 mins  Derl Barrow, OTR/L [place]  Office [phone] Pager: [place] 07/16/2021, 12:39 PM

## 2021-07-17 DIAGNOSIS — L03116 Cellulitis of left lower limb: Secondary | ICD-10-CM | POA: Diagnosis not present

## 2021-07-17 LAB — BASIC METABOLIC PANEL
Anion gap: 11 (ref 5–15)
BUN: 34 mg/dL — ABNORMAL HIGH (ref 8–23)
CO2: 34 mmol/L — ABNORMAL HIGH (ref 22–32)
Calcium: 10.1 mg/dL (ref 8.9–10.3)
Chloride: 90 mmol/L — ABNORMAL LOW (ref 98–111)
Creatinine, Ser: 1.46 mg/dL — ABNORMAL HIGH (ref 0.44–1.00)
GFR, Estimated: 37 mL/min — ABNORMAL LOW (ref >60)
Glucose, Bld: 94 mg/dL (ref 70–99)
Potassium: 3.1 mmol/L — ABNORMAL LOW (ref 3.5–5.1)
Sodium: 135 mmol/L (ref 135–145)

## 2021-07-17 LAB — RESP PANEL BY RT-PCR (FLU A&B, COVID) ARPGX2
Influenza A by PCR: NEGATIVE
Influenza B by PCR: NEGATIVE
SARS Coronavirus 2 by RT PCR: NEGATIVE

## 2021-07-17 LAB — PROTIME-INR
INR: 3.8 — ABNORMAL HIGH (ref 0.8–1.2)
Prothrombin Time: 37.3 seconds — ABNORMAL HIGH (ref 11.4–15.2)

## 2021-07-17 MED ORDER — POTASSIUM CHLORIDE 10 MEQ/100ML IV SOLN
10.0000 meq | INTRAVENOUS | Status: AC
Start: 2021-07-17 — End: 2021-07-17
  Administered 2021-07-17 (×2): 10 meq via INTRAVENOUS
  Filled 2021-07-17: qty 100

## 2021-07-17 MED ORDER — POTASSIUM CHLORIDE CRYS ER 20 MEQ PO TBCR
40.0000 meq | EXTENDED_RELEASE_TABLET | Freq: Once | ORAL | Status: AC
  Administered 2021-07-17: 40 meq via ORAL
  Filled 2021-07-17: qty 2

## 2021-07-17 NOTE — Progress Notes (Signed)
[place] for  warfarin Indication:  hx atrial fibrillation  Allergies  Allergen Reactions   Chlorhexidine Gluconate Hives   Penicillins Itching    Has patient had a PCN reaction causing immediate rash, facial/tongue/throat swelling, SOB or lightheadedness with hypotension: no Has patient had a PCN reaction causing severe rash involving mucus membranes or skin necrosis: No Has patient had a PCN reaction that required hospitalization: No Has patient had a PCN reaction occurring within the last 10 years: No If all of the above answers are "NO", then may proceed with Cephalosporin use.  Tolerated Cephalosporin Date: 03/17/21.    Patient Measurements: Height: 5\' 1"  (154.9 cm) Weight: 110.5 kg (243 lb 9.7 oz) IBW/kg (Calculated) : 47.8   Vital Signs: Temp: 99.5 F (37.5 C) (09/10 0900) Temp Source: Oral (09/10 0900) BP: 122/65 (09/10 0900) Pulse Rate: 63 (09/10 0900)  Labs: Recent Labs    07/15/21 0431 07/16/21 0431 07/17/21 0526  HGB  --  9.2*  --   HCT  --  29.2*  --   PLT  --  205  --   LABPROT 26.1* 30.5* 37.3*  INR 2.4* 2.9* 3.8*  CREATININE  --  1.36* 1.46*    Estimated Creatinine Clearance: 38.3 mL/min (A) (by C-G formula based on SCr of 1.46 mg/dL (H)).  Medications:  - on warfarin 2.5mg  daily except 5mg  on Tue PTA (last dose taken on 8/24)  Assessment: Patient is a 75 y.o F with hx afib on warfarin PTA, presented to the ED on 8/25 with failure to thrive, She also reported of a fall one week ago and reports poor oral intake afterwards. Head CT was negative for any acute abnormality.  Pharmacy has been consulted to dose warfarin while she's hospitalized.  - Vitamin K 2.5mg  PO x 1 given on 8/26 and 8/29 for elevated INR - Warfarin resumed 8/30  Today, 07/17/2021: INR SUPRAtherapeutic this AM (no warfarin given x 4 days then reduced dose of 2mg  given 9/5 then no warf again 9/6, 2.5 mg given 9/7 and 9/8 and 1mg  warfarin  9/9) CBC: Hgb low but stable at 9.3, Pltc WNL Eating only ~10% of meals for most part as per charting  Goal of Therapy:  INR 2-3 Monitor platelets by anticoagulation protocol: Yes   Plan:  NO warfarin today Daily PT/INR Monitor CBC and for s/sx of bleeding    Adrian Saran, PharmD, BCPS Secure Chat if ?s 07/17/2021 1:33 PM

## 2021-07-17 NOTE — Progress Notes (Signed)
PROGRESS NOTE    Katelyn Jackson  CBJ:[id number] DOB: 22-Feb-1946 DOA: 07/01/2021 PCP: Cassandria Anger, MD   Brief Narrative: 75 years old female with PMH significant for permanent A. fib on Coumadin, hypertension, hyperlipidemia, pacemaker in place, history of breast cancer, hypothyroidism, chronic bilateral lymphedema, recurrent falls, morbid obesity, BMI 51, GERD, pulmonary hypertension who needs full assistance for daily life activities, She has home health aide 6 times a week at home,  brought to the ED by her son for the evaluation of recent fall on last Monday.  Patient fell from bed on Monday since then complaining about left shoulder pain,  left ankle pain and bilateral knee pain and unable to walk or stand, go to the bathroom and progressively declining. She was found to have left lower extremity cellulitis.  Patient completed antibiotics.  Cellulitis has resolved.  Patient was given vitamin K due to supratherapeutic INR.  She was also given Lasix for CHF exacerbation.  She was managed with IV antibiotics for cellulitis,  IV Lasix for CHF.  ABG showed CO2 retention-chronic hypercapnia compensated.  Assessment & Plan:   Principal Problem:   Cellulitis of left lower extremity Active Problems:   Essential hypertension   Chronic atrial fibrillation (HCC)   Lymphedema   Anemia of chronic disease   CRF (chronic renal failure), stage 4 (severe) (HCC)   Morbid obesity (HCC)   GERD (gastroesophageal reflux disease)   Hypothyroidism   Supratherapeutic INR   Pulmonary edema   Pressure injury of skin  Cellulitis of left lower extremity/ Chronic bilateral lymphedema: Completed antibiotics.  Erythema and warmth has resolved.   Recurrent falls / Ambulatory dysfunction / moderate to severe osteoarthritis: Patient had complete skeletal survey.  X-ray of the knees and left ankle: No fracture Right hip: Degenerative disc pain.  Patient has chronic pain for years. Dr. Para March orthopedics  was consulted.  Adequate pain control with oral and IV opiates. Despite the need for bilateral total knee replacements,  She is certainly not a surgical candidate from cardiac, renal and body habitus standpoint. PT and OT recommended  skilled nursing facility.   Acute hypoxic respiratory failure could be multifactorial:  > Resolved. secondary to fluid overload,  acute on chronic systolic CHF, hypercapnia likely from OSA. Respiratory status much improved.  Patient had adequate diuresis with negative fluid balance. Continue supplemental oxygen to keep saturation above 94%. Diuretics changed to torsemide po 40 mg twice daily. She is now 93% on room air.   AKI on CKD stage IIIb: Baseline serum creatinine 1.4-1.6 Creatinine improved with torsemide and  remained stable. Avoid nephrotoxic medications.   Essential hypertension: Blood pressure 121/62 decrease the dose of Coreg   Chronic atrial fibrillation: Continue Coreg, heart rate controlled. Coumadin was on hold because of supratherapeutic INR.   INR today is 2.8 Restart Coumadin   Pacemaker in place: Stable.  Hypothyroidism: TSH 2.7 on 07/02/2021 Continue levothyroxine.   Anemia of chronic disease: Hemoglobin remained stable, no obvious bleeding.   History of breast cancer: Outpatient follow-up.   Right shoulder pain and swelling: Venous duplex ruled out DVT. Right shoulder x-ray and CT scan showed  chronic anterior shoulder dislocation. Will change antibiotics to vancomycin since no improvement with doxycycline.  Patient has a stitch abscess in the right shoulder she is status post incision and drainage by interventional radiology on 07/16/2021.  Cultures are negative so far.  Continue vancomycin..  Encephalopathy/changes in mental status family concerned this is not her baseline.  Patient is much more  awake and alert today she reports that she is feeling better. ABG unremarkable Hold any sedatives or narcotics [id number] Folate  9.2  Rpr non reactive  ammonia normal   Pressure Injury 07/03/21 Thigh Posterior;Right Stage 3 -  Full thickness tissue loss. Subcutaneous fat may be visible but bone, tendon or muscle are NOT exposed. (Active)  07/03/21 0453  Location: Thigh  Location Orientation: Posterior;Right  Staging: Stage 3 -  Full thickness tissue loss. Subcutaneous fat may be visible but bone, tendon or muscle are NOT exposed.  Wound Description (Comments):   Present on Admission: Yes     Pressure Injury 07/03/21 Sacrum Stage 2 -  Partial thickness loss of dermis presenting as a shallow open injury with a red, pink wound bed without slough. (Active)  07/03/21 2105  Location: Sacrum  Location Orientation:   Staging: Stage 2 -  Partial thickness loss of dermis presenting as a shallow open injury with a red, pink wound bed without slough.  Wound Description (Comments):   Present on Admission: Yes    Estimated body mass index is 46.03 kg/m as calculated from the following:   Height as of this encounter: 5\' 1"  (1.549 m).   Weight as of this encounter: 110.5 kg.  DVT prophylaxis: Coumadin  code Status: Full code Family Communication: None at bedside  disposition Plan:  Status is: Inpatient  Remains inpatient appropriate because:Hemodynamically unstable  Dispo: The patient is from: Home              Anticipated d/c is to: SNF              Patient currently is not medically stable to d/c.   Difficult to place patient No       Consultants:  Ortho  Procedures: None Antimicrobials: Doxycycline  Subjective: She is resting in bed she has no new complaints Objective: Vitals:   07/17/21 0115 07/17/21 0520 07/17/21 0900 07/17/21 1406  BP:  135/68 122/65   Pulse:  61 63   Resp:  20 18   Temp: 99.2 F (37.3 C) (!) 100.5 F (38.1 C) 99.5 F (37.5 C)   TempSrc: Oral Oral Oral Oral  SpO2:  94% 93%   Weight:  110.5 kg    Height:        Intake/Output Summary (Last 24 hours) at 07/17/2021  1422 Last data filed at 07/17/2021 1130 Gross per 24 hour  Intake 179.45 ml  Output 1950 ml  Net -1770.55 ml    Filed Weights   07/15/21 0530 07/16/21 0500 07/17/21 0520  Weight: 111 kg 113.6 kg 110.5 kg    Examination:  General exam: Appears calm and comfortable  Respiratory system: Clear to auscultation. Respiratory effort normal. Cardiovascular system: S1 & S2 heard, RRR. No JVD, murmurs, rubs, gallops or clicks.. Gastrointestinal system: Abdomen is nondistended, soft and nontender. No organomegaly or masses felt. Normal bowel sounds heard. Central nervous system: Alert and oriented. No focal neurological deficits. Extremities: Bilateral lower extremity and right upper extremity edema. Skin: No rashes, lesions or ulcers Psychiatry: Difficult to assess due to language barrier   Data Reviewed: I have personally reviewed following labs and imaging studies  CBC: Recent Labs  Lab 07/11/21 0510 07/12/21 0437 07/13/21 0421 07/14/21 0450 07/16/21 0431  WBC 6.1 6.7 5.5 5.9 5.4  HGB 9.9* 9.6* 9.4* 9.3* 9.2*  HCT 31.1* 30.0* 29.9* 29.4* 29.2*  MCV 93.1 93.5 93.4 93.9 94.2  PLT 251 245 239 221 361    Basic Metabolic Panel:  Recent Labs  Lab 07/13/21 0421 07/14/21 0450 07/16/21 0431 07/17/21 0526  NA 136 136 133* 135  K 3.2* 3.5 3.1* 3.1*  CL 94* 94* 91* 90*  CO2 37* 36* 33* 34*  GLUCOSE 97 105* 95 94  BUN 32* 33* 30* 34*  CREATININE 1.42* 1.51* 1.36* 1.46*  CALCIUM 9.9 10.2 9.8 10.1  MG 2.4  --  2.3  --   PHOS 3.2  --   --   --     GFR: Estimated Creatinine Clearance: 38.3 mL/min (A) (by C-G formula based on SCr of 1.46 mg/dL (H)). Liver Function Tests: Recent Labs  Lab 07/16/21 0431  AST 26  ALT 21  ALKPHOS 373*  BILITOT 2.8*  PROT 6.1*  ALBUMIN 2.1*    No results for input(s): LIPASE, AMYLASE in the last 168 hours. Recent Labs  Lab 07/16/21 0431  AMMONIA 31    Coagulation Profile: Recent Labs  Lab 07/13/21 0421 07/14/21 0450 07/15/21 0431  07/16/21 0431 07/17/21 0526  INR 3.4* 2.8* 2.4* 2.9* 3.8*    Cardiac Enzymes: No results for input(s): CKTOTAL, CKMB, CKMBINDEX, TROPONINI in the last 168 hours. BNP (last 3 results) No results for input(s): PROBNP in the last 8760 hours. HbA1C: No results for input(s): HGBA1C in the last 72 hours. CBG: Recent Labs  Lab 07/13/21 0202  GLUCAP 100*    Lipid Profile: No results for input(s): CHOL, HDL, LDLCALC, TRIG, CHOLHDL, LDLDIRECT in the last 72 hours. Thyroid Function Tests: No results for input(s): TSH, T4TOTAL, FREET4, T3FREE, THYROIDAB in the last 72 hours. Anemia Panel: Recent Labs    07/16/21 0431  VITAMINB12 5,766*  FOLATE 9.2    Sepsis Labs: No results for input(s): PROCALCITON, LATICACIDVEN in the last 168 hours.  Recent Results (from the past 240 hour(s))  Resp Panel by RT-PCR (Flu A&B, Covid) Nasopharyngeal Swab     Status: None   Collection Time: 07/13/21  6:05 PM   Specimen: Nasopharyngeal Swab; Nasopharyngeal(NP) swabs in vial transport medium  Result Value Ref Range Status   SARS Coronavirus 2 by RT PCR NEGATIVE NEGATIVE Final    Comment: (NOTE) SARS-CoV-2 target nucleic acids are NOT DETECTED.  The SARS-CoV-2 RNA is generally detectable in upper respiratory specimens during the acute phase of infection. The lowest concentration of SARS-CoV-2 viral copies this assay can detect is 138 copies/mL. A negative result does not preclude SARS-Cov-2 infection and should not be used as the sole basis for treatment or other patient management decisions. A negative result may occur with  improper specimen collection/handling, submission of specimen other than nasopharyngeal swab, presence of viral mutation(s) within the areas targeted by this assay, and inadequate number of viral copies(<138 copies/mL). A negative result must be combined with clinical observations, patient history, and epidemiological information. The expected result is Negative.  Fact  Sheet for Patients:  EntrepreneurPulse.com.au  Fact Sheet for Healthcare Providers:  IncredibleEmployment.be  This test is no t yet approved or cleared by the [place] FDA and  has been authorized for detection and/or diagnosis of SARS-CoV-2 by FDA under an Emergency Use Authorization (EUA). This EUA will remain  in effect (meaning this test can be used) for the duration of the COVID-19 declaration under Section 564(b)(1) of the Act, 21 U.S.C.section 360bbb-3(b)(1), unless the authorization is terminated  or revoked sooner.       Influenza A by PCR NEGATIVE NEGATIVE Final   Influenza B by PCR NEGATIVE NEGATIVE Final    Comment: (NOTE) The Xpert Xpress SARS-CoV-2/FLU/RSV  plus assay is intended as an aid in the diagnosis of influenza from Nasopharyngeal swab specimens and should not be used as a sole basis for treatment. Nasal washings and aspirates are unacceptable for Xpert Xpress SARS-CoV-2/FLU/RSV testing.  Fact Sheet for Patients: EntrepreneurPulse.com.au  Fact Sheet for Healthcare Providers: IncredibleEmployment.be  This test is not yet approved or cleared by the [place] FDA and has been authorized for detection and/or diagnosis of SARS-CoV-2 by FDA under an Emergency Use Authorization (EUA). This EUA will remain in effect (meaning this test can be used) for the duration of the COVID-19 declaration under Section 564(b)(1) of the Act, 21 U.S.C. section 360bbb-3(b)(1), unless the authorization is terminated or revoked.  Performed at [facility], [place] [address] [place], [place] [address]   Aerobic/Anaerobic Culture w Gram Stain (surgical/deep wound)     Status: None (Preliminary result)   Collection Time: 07/16/21  4:20 PM   Specimen: Abscess  Result Value Ref Range Status   Specimen Description   Final    ABSCESS RIGHT ARM Performed at [place] [address], [place], [place] [address]    Special Requests   Final    NONE Performed at [facility], [place] [address], [place], [place] [address]    Gram Stain   Final    ABUNDANT WBC PRESENT,BOTH PMN AND MONONUCLEAR NO ORGANISMS SEEN    Culture   Final    NO GROWTH < 24 HOURS Performed at [place] [address], [place], [place] [address]    Report Status PENDING  Incomplete  Aerobic/Anaerobic Culture w Gram Stain (surgical/deep wound)     Status: None (Preliminary result)   Collection Time: 07/16/21  4:25 PM   Specimen: Joint, Right Shoulder; Synovial Fluid  Result Value Ref Range Status   Specimen Description   Final    JOINT FLUID RIGHT SHOULDER Performed at [facility], [place] [address], [place], [place] [address]    Special Requests   Final    NONE Performed at [facility], [place] [address], [place], [place] [address]    Gram Stain   Final    FEW WBC PRESENT, PREDOMINANTLY PMN NO ORGANISMS SEEN    Culture   Final    NO GROWTH < 24 HOURS Performed at [place] [address] [place], [place] [address]    Report Status PENDING  Incomplete  Resp Panel by RT-PCR (Flu A&B, Covid) Nasopharyngeal Swab     Status: None   Collection Time: 07/17/21 11:13 AM   Specimen: Nasopharyngeal Swab; Nasopharyngeal(NP) swabs in vial transport medium  Result Value Ref Range Status   SARS Coronavirus 2 by RT PCR NEGATIVE NEGATIVE Final    Comment: (NOTE) SARS-CoV-2 target nucleic acids are NOT DETECTED.  The SARS-CoV-2 RNA is generally detectable in upper respiratory specimens during the acute phase of infection. The lowest concentration of SARS-CoV-2 viral copies this assay can detect is 138 copies/mL. A negative result does not preclude SARS-Cov-2 infection and should not be used as the sole basis for treatment or other patient management decisions. A negative result may occur with  improper specimen  collection/handling, submission of specimen other than nasopharyngeal swab, presence of viral mutation(s) within the areas targeted by this assay, and inadequate number of viral copies(<138 copies/mL). A negative result must be combined with clinical observations, patient history, and epidemiological information. The expected result is Negative.  Fact Sheet for Patients:  EntrepreneurPulse.com.au  Fact Sheet for Healthcare Providers:  IncredibleEmployment.be  This test is no t yet approved or cleared by the [place] FDA and  has been authorized for detection and/or diagnosis of SARS-CoV-2 by FDA under an Emergency Use Authorization (EUA). This EUA will remain  in effect (meaning this test can be used) for the duration of the COVID-19 declaration under Section 564(b)(1) of the Act, 21 U.S.C.section 360bbb-3(b)(1), unless the authorization is terminated  or revoked sooner.       Influenza A by PCR NEGATIVE NEGATIVE Final   Influenza B by PCR NEGATIVE NEGATIVE Final    Comment: (NOTE) The Xpert Xpress SARS-CoV-2/FLU/RSV plus assay is intended as an aid in the diagnosis of influenza from Nasopharyngeal swab specimens and should not be used as a sole basis for treatment. Nasal washings and aspirates are unacceptable for Xpert Xpress SARS-CoV-2/FLU/RSV testing.  Fact Sheet for Patients: EntrepreneurPulse.com.au  Fact Sheet for Healthcare Providers: IncredibleEmployment.be  This test is not yet approved or cleared by the [place] FDA and has been authorized for detection and/or diagnosis of SARS-CoV-2 by FDA under an Emergency Use Authorization (EUA). This EUA will remain in effect (meaning this test can be used) for the duration of the COVID-19 declaration under Section 564(b)(1) of the Act, 21 U.S.C. section 360bbb-3(b)(1), unless the authorization is terminated or revoked.  Performed at [facility], [place] [address], [place], [place] [address]           Radiology Studies: IR US Guide Bx Asp/Drain  Result Date: 07/16/2021 INDICATION: Status post right shoulder hemiarthroplasty on 03/17/2021. Development of complex fluid collection of the anterior right upper arm adjacent to the humerus which may communicate with the glenohumeral joint by CT. Request made for sampling and drainage of the fluid collection and shoulder joint. EXAM: 1. ULTRASOUND-GUIDED ASPIRATION ABSCESS/FLUID COLLECTION OF THE RIGHT UPPER EXTREMITY SOFT TISSUES 2. ASPIRATION OF THE RIGHT GLENOHUMERAL JOINT UNDER FLUOROSCOPY MEDICATIONS: None ANESTHESIA/SEDATION: Fentanyl 100 mcg IV; Versed 2.0 mg IV Moderate Sedation Time:  22 minutes The patient was continuously monitored during the procedure by the interventional radiology nurse under my direct supervision. FLUOROSCOPY TIME:  36 seconds.  11.0 mGy. COMPLICATIONS: None immediate. PROCEDURE: Informed written consent was obtained from the patient and her son after a thorough discussion of the procedural risks, benefits and alternatives. All questions were addressed. Maximal Sterile Barrier Technique was utilized including caps, mask, sterile gowns, sterile gloves, sterile drape, hand hygiene and skin antiseptic. A timeout was performed prior to the initiation of the procedure. Under ultrasound guidance, an 18 gauge trocar needle was advanced to the level of a fluid collection in the right upper arm. Aspiration was performed in several different locations and a fluid sample sent for culture analysis. Under fluoroscopy, an 18 gauge spinal needle was advanced to the level of the glenohumeral joint. Aspiration was performed in 2 different locations. A fluid sample was sent for culture analysis. FINDINGS: Elongated and serpiginous complex fluid collection of the right upper arm approaches the skin. Aspiration at the level of the deeper component of this collection  yielded a tiny amount of debris and no free fluid. Some of this debris appeared purulent but was extremely thick in nature and difficult to aspirate. As the needle was moved into a more superficial location, additional bloody fluid was also able to be aspirated. In total, approximately 5 mL of bloody fluid mixed with purulent appearing debris was able to be aspirated and sent for culture analysis.  Due to nature of the fluid, placement of a small caliber drain was not indicated and would likely not drain well. Aspiration at the level of the glenohumeral joint after hemiarthroplasty yielded only approximately 2 mL of bloody fluid. This was mixed with a small amount of sterile saline and sent for culture analysis. IMPRESSION: 1. Ultrasound-guided aspiration at the level of a complex fluid collection in the right upper arm yielded bloody fluid and debris. Deeper component of this fluid collection yielded almost no fluid likely related to very thick internal contents. The more superficial component yielded bloody fluid. A total sample of 5 mL was able to be aspirated and sent for culture analysis. 2. Separate fluoroscopic guided aspiration at the level of the glenohumeral joint after prior hemiarthroplasty yielded approximately 2 mL of bloody fluid without gross purulence. This sample was sent separately for culture analysis. Electronically Signed   By: Aletta Edouard M.D.   On: 07/16/2021 17:55   IR DRAIN/INJ MAJOR JOINT/BURSA  Result Date: 07/16/2021 INDICATION: Status post right shoulder hemiarthroplasty on 03/17/2021. Development of complex fluid collection of the anterior right upper arm adjacent to the humerus which may communicate with the glenohumeral joint by CT. Request made for sampling and drainage of the fluid collection and shoulder joint. EXAM: 1. ULTRASOUND-GUIDED ASPIRATION ABSCESS/FLUID COLLECTION OF THE RIGHT UPPER EXTREMITY SOFT TISSUES 2. ASPIRATION OF THE RIGHT GLENOHUMERAL JOINT UNDER  FLUOROSCOPY MEDICATIONS: None ANESTHESIA/SEDATION: Fentanyl 100 mcg IV; Versed 2.0 mg IV Moderate Sedation Time:  22 minutes The patient was continuously monitored during the procedure by the interventional radiology nurse under my direct supervision. FLUOROSCOPY TIME:  36 seconds.  11.0 mGy. COMPLICATIONS: None immediate. PROCEDURE: Informed written consent was obtained from the patient and her son after a thorough discussion of the procedural risks, benefits and alternatives. All questions were addressed. Maximal Sterile Barrier Technique was utilized including caps, mask, sterile gowns, sterile gloves, sterile drape, hand hygiene and skin antiseptic. A timeout was performed prior to the initiation of the procedure. Under ultrasound guidance, an 18 gauge trocar needle was advanced to the level of a fluid collection in the right upper arm. Aspiration was performed in several different locations and a fluid sample sent for culture analysis. Under fluoroscopy, an 18 gauge spinal needle was advanced to the level of the glenohumeral joint. Aspiration was performed in 2 different locations. A fluid sample was sent for culture analysis. FINDINGS: Elongated and serpiginous complex fluid collection of the right upper arm approaches the skin. Aspiration at the level of the deeper component of this collection yielded a tiny amount of debris and no free fluid. Some of this debris appeared purulent but was extremely thick in nature and difficult to aspirate. As the needle was moved into a more superficial location, additional bloody fluid was also able to be aspirated. In total, approximately 5 mL of bloody fluid mixed with purulent appearing debris was able to be aspirated and sent for culture analysis. Due to nature of the fluid, placement of a small caliber drain was not indicated and would likely not drain well. Aspiration at the level of the glenohumeral joint after hemiarthroplasty yielded only approximately 2 mL of  bloody fluid. This was mixed with a small amount of sterile saline and sent for culture analysis. IMPRESSION: 1. Ultrasound-guided aspiration at the level of a complex fluid collection in the right upper arm yielded bloody fluid and debris. Deeper component of this fluid collection yielded almost no fluid likely related to very thick internal contents.  The more superficial component yielded bloody fluid. A total sample of 5 mL was able to be aspirated and sent for culture analysis. 2. Separate fluoroscopic guided aspiration at the level of the glenohumeral joint after prior hemiarthroplasty yielded approximately 2 mL of bloody fluid without gross purulence. This sample was sent separately for culture analysis. Electronically Signed   By: Aletta Edouard M.D.   On: 07/16/2021 17:55        Scheduled Meds:  carvedilol  6.25 mg Oral BID WC   cholecalciferol  2,000 Units Oral Daily   exemestane  25 mg Oral QPC breakfast   levothyroxine  50 mcg Oral QAC breakfast   magnesium oxide  400 mg Oral BID   pantoprazole  40 mg Oral Daily   pravastatin  20 mg Oral q1800   torsemide  40 mg Oral BID   vitamin B-12  2,000 mcg Oral Daily   Warfarin - Pharmacist Dosing Inpatient   Does not apply q1600   Continuous Infusions:  sodium chloride 250 mL (07/16/21 1319)   vancomycin 750 mg (07/17/21 1301)     LOS: 15 days    Time spent: 39 min    Georgette Shell, MD  07/17/2021, 2:22 PM

## 2021-07-18 DIAGNOSIS — L03116 Cellulitis of left lower limb: Secondary | ICD-10-CM | POA: Diagnosis not present

## 2021-07-18 LAB — BASIC METABOLIC PANEL
Anion gap: 9 (ref 5–15)
BUN: 33 mg/dL — ABNORMAL HIGH (ref 8–23)
CO2: 35 mmol/L — ABNORMAL HIGH (ref 22–32)
Calcium: 10.2 mg/dL (ref 8.9–10.3)
Chloride: 92 mmol/L — ABNORMAL LOW (ref 98–111)
Creatinine, Ser: 1.38 mg/dL — ABNORMAL HIGH (ref 0.44–1.00)
GFR, Estimated: 40 mL/min — ABNORMAL LOW (ref >60)
Glucose, Bld: 118 mg/dL — ABNORMAL HIGH (ref 70–99)
Potassium: 3.3 mmol/L — ABNORMAL LOW (ref 3.5–5.1)
Sodium: 136 mmol/L (ref 135–145)

## 2021-07-18 LAB — PROTIME-INR
INR: 3.6 — ABNORMAL HIGH (ref 0.8–1.2)
Prothrombin Time: 35.5 seconds — ABNORMAL HIGH (ref 11.4–15.2)

## 2021-07-18 MED ORDER — LACTULOSE 10 GM/15ML PO SOLN
20.0000 g | Freq: Three times a day (TID) | ORAL | Status: AC
  Administered 2021-07-18: 20 g via ORAL
  Administered 2021-07-18: 10 g via ORAL
  Filled 2021-07-18 (×2): qty 30

## 2021-07-18 MED ORDER — SORBITOL 70 % SOLN
300.0000 mL | TOPICAL_OIL | Freq: Once | ORAL | Status: AC
  Administered 2021-07-18: 300 mL via RECTAL
  Filled 2021-07-18: qty 90

## 2021-07-18 MED ORDER — POTASSIUM CHLORIDE CRYS ER 20 MEQ PO TBCR
40.0000 meq | EXTENDED_RELEASE_TABLET | Freq: Once | ORAL | Status: AC
  Administered 2021-07-18: 40 meq via ORAL
  Filled 2021-07-18: qty 2

## 2021-07-18 MED ORDER — BISACODYL 10 MG RE SUPP
10.0000 mg | Freq: Once | RECTAL | Status: AC
  Administered 2021-07-18: 10 mg via RECTAL
  Filled 2021-07-18: qty 1

## 2021-07-18 NOTE — Progress Notes (Signed)
PROGRESS NOTE    Katelyn Jackson  MHD:[id number] DOB: 1946/07/27 DOA: 07/01/2021 PCP: Cassandria Anger, MD   Brief Narrative: 75 years old female with PMH significant for permanent A. fib on Coumadin, hypertension, hyperlipidemia, pacemaker in place, history of breast cancer, hypothyroidism, chronic bilateral lymphedema, recurrent falls, morbid obesity, BMI 51, GERD, pulmonary hypertension who needs full assistance for daily life activities, She has home health aide 6 times a week at home,  brought to the ED by her son for the evaluation of recent fall on last Monday.  Patient fell from bed on Monday since then complaining about left shoulder pain,  left ankle pain and bilateral knee pain and unable to walk or stand, go to the bathroom and progressively declining. She was found to have left lower extremity cellulitis.  Patient completed antibiotics.  Cellulitis has resolved.  Patient was given vitamin K due to supratherapeutic INR.  She was also given Lasix for CHF exacerbation.  She was managed with IV antibiotics for cellulitis,  IV Lasix for CHF.  ABG showed CO2 retention-chronic hypercapnia compensated.  Assessment & Plan:   Principal Problem:   Cellulitis of left lower extremity Active Problems:   Essential hypertension   Chronic atrial fibrillation (HCC)   Lymphedema   Anemia of chronic disease   CRF (chronic renal failure), stage 4 (severe) (HCC)   Morbid obesity (HCC)   GERD (gastroesophageal reflux disease)   Hypothyroidism   Supratherapeutic INR   Pulmonary edema   Pressure injury of skin  Cellulitis of left lower extremity/ Chronic bilateral lymphedema: Completed antibiotics.  Erythema and warmth has resolved.   Recurrent falls / Ambulatory dysfunction / moderate to severe osteoarthritis: Patient had complete skeletal survey.  X-ray of the knees and left ankle: No fracture Right hip: Degenerative disc pain.  Patient has chronic pain for years. Dr. Para March orthopedics  was consulted.  Adequate pain control with oral and IV opiates. Despite the need for bilateral total knee replacements,  She is certainly not a surgical candidate from cardiac, renal and body habitus standpoint. PT and OT recommended  skilled nursing facility.   Acute hypoxic respiratory failure could be multifactorial:  > Resolved. secondary to fluid overload,  acute on chronic systolic CHF, hypercapnia likely from OSA. Respiratory status much improved.  Patient had adequate diuresis with negative fluid balance. Continue supplemental oxygen to keep saturation above 94%. Diuretics changed to torsemide po 40 mg twice daily. She is now 93% on room air.   AKI on CKD stage IIIb: Baseline serum creatinine 1.4-1.6 Creatinine improved with torsemide and  remained stable. Avoid nephrotoxic medications.   Essential hypertension: Blood pressure 121/62 decrease the dose of Coreg   Chronic atrial fibrillation: Continue Coreg, heart rate controlled. Coumadin was on hold because of supratherapeutic INR.   INR today is 2.8 Restart Coumadin   Pacemaker in place: Stable.  Hypothyroidism: TSH 2.7 on 07/02/2021 Continue levothyroxine.   Anemia of chronic disease: Hemoglobin remained stable, no obvious bleeding.   History of breast cancer: Outpatient follow-up.   Right shoulder pain and swelling: Venous duplex ruled out DVT. Right shoulder x-ray and CT scan showed  chronic anterior shoulder dislocation. Will change antibiotics to vancomycin since no improvement with doxycycline.  Patient has a stitch abscess in the right shoulder she is status post incision and drainage by interventional radiology on 07/16/2021.  Cultures are negative so far.  Continue vancomycin..  Encephalopathy-resolved work-up including ABG B12 folate RPR and ammonia negative.  Constipation stool softeners laxatives today.  Pressure Injury 07/03/21 Thigh Posterior;Right Stage 3 -  Full thickness tissue loss. Subcutaneous fat  may be visible but bone, tendon or muscle are NOT exposed. (Active)  07/03/21 0453  Location: Thigh  Location Orientation: Posterior;Right  Staging: Stage 3 -  Full thickness tissue loss. Subcutaneous fat may be visible but bone, tendon or muscle are NOT exposed.  Wound Description (Comments):   Present on Admission: Yes     Pressure Injury 07/03/21 Sacrum Stage 2 -  Partial thickness loss of dermis presenting as a shallow open injury with a red, pink wound bed without slough. (Active)  07/03/21 2105  Location: Sacrum  Location Orientation:   Staging: Stage 2 -  Partial thickness loss of dermis presenting as a shallow open injury with a red, pink wound bed without slough.  Wound Description (Comments):   Present on Admission: Yes    Estimated body mass index is 44.15 kg/m as calculated from the following:   Height as of this encounter: 5\' 1"  (1.549 m).   Weight as of this encounter: 106 kg.  DVT prophylaxis: Coumadin  code Status: Full code Family Communication: None at bedside  disposition Plan:  Status is: Inpatient  Remains inpatient appropriate because:Hemodynamically unstable  Dispo: The patient is from: Home              Anticipated d/c is to: SNF              Patient currently is not medically stable to d/c.   Difficult to place patient No       Consultants:  Ortho  Procedures: None Antimicrobials: Doxycycline  Subjective: She is complaining of constipation she says she have not moved her bowels for 4 days she has no other complaints Objective: Vitals:   07/18/21 0500 07/18/21 0617 07/18/21 1231 07/18/21 1231  BP:  (!) 148/71 124/62 124/62  Pulse:  82 76 76  Resp:  20  18  Temp:  98.9 F (37.2 C) 98.6 F (37 C) 98.6 F (37 C)  TempSrc:  Oral Oral Oral  SpO2:  95% 98% 98%  Weight: 106 kg     Height:        Intake/Output Summary (Last 24 hours) at 07/18/2021 1428 Last data filed at 07/18/2021 0900 Gross per 24 hour  Intake 240 ml  Output 1050 ml   Net -810 ml    Filed Weights   07/16/21 0500 07/17/21 0520 07/18/21 0500  Weight: 113.6 kg 110.5 kg 106 kg    Examination:  General exam: Appears calm and comfortable  Respiratory system: Clear to auscultation. Respiratory effort normal. Cardiovascular system: S1 & S2 heard, RRR. No JVD, murmurs, rubs, gallops or clicks.. Gastrointestinal system: Abdomen is nondistended, soft and nontender. No organomegaly or masses felt. Normal bowel sounds heard. Central nervous system: Alert and oriented. No focal neurological deficits. Extremities: Bilateral lower extremity and right upper extremity edema. Skin: No rashes, lesions or ulcers Psychiatry: Difficult to assess due to language barrier   Data Reviewed: I have personally reviewed following labs and imaging studies  CBC: Recent Labs  Lab 07/12/21 0437 07/13/21 0421 07/14/21 0450 07/16/21 0431  WBC 6.7 5.5 5.9 5.4  HGB 9.6* 9.4* 9.3* 9.2*  HCT 30.0* 29.9* 29.4* 29.2*  MCV 93.5 93.4 93.9 94.2  PLT 245 239 221 676    Basic Metabolic Panel: Recent Labs  Lab 07/13/21 0421 07/14/21 0450 07/16/21 0431 07/17/21 0526 07/18/21 0524  NA 136 136 133* 135 136  K 3.2* 3.5 3.1* 3.1*  3.3*  CL 94* 94* 91* 90* 92*  CO2 37* 36* 33* 34* 35*  GLUCOSE 97 105* 95 94 118*  BUN 32* 33* 30* 34* 33*  CREATININE 1.42* 1.51* 1.36* 1.46* 1.38*  CALCIUM 9.9 10.2 9.8 10.1 10.2  MG 2.4  --  2.3  --   --   PHOS 3.2  --   --   --   --     GFR: Estimated Creatinine Clearance: 39.5 mL/min (A) (by C-G formula based on SCr of 1.38 mg/dL (H)). Liver Function Tests: Recent Labs  Lab 07/16/21 0431  AST 26  ALT 21  ALKPHOS 373*  BILITOT 2.8*  PROT 6.1*  ALBUMIN 2.1*    No results for input(s): LIPASE, AMYLASE in the last 168 hours. Recent Labs  Lab 07/16/21 0431  AMMONIA 31    Coagulation Profile: Recent Labs  Lab 07/14/21 0450 07/15/21 0431 07/16/21 0431 07/17/21 0526 07/18/21 0524  INR 2.8* 2.4* 2.9* 3.8* 3.6*    Cardiac  Enzymes: No results for input(s): CKTOTAL, CKMB, CKMBINDEX, TROPONINI in the last 168 hours. BNP (last 3 results) No results for input(s): PROBNP in the last 8760 hours. HbA1C: No results for input(s): HGBA1C in the last 72 hours. CBG: Recent Labs  Lab 07/13/21 0202  GLUCAP 100*    Lipid Profile: No results for input(s): CHOL, HDL, LDLCALC, TRIG, CHOLHDL, LDLDIRECT in the last 72 hours. Thyroid Function Tests: No results for input(s): TSH, T4TOTAL, FREET4, T3FREE, THYROIDAB in the last 72 hours. Anemia Panel: Recent Labs    07/16/21 0431  VITAMINB12 5,766*  FOLATE 9.2    Sepsis Labs: No results for input(s): PROCALCITON, LATICACIDVEN in the last 168 hours.  Recent Results (from the past 240 hour(s))  Resp Panel by RT-PCR (Flu A&B, Covid) Nasopharyngeal Swab     Status: None   Collection Time: 07/13/21  6:05 PM   Specimen: Nasopharyngeal Swab; Nasopharyngeal(NP) swabs in vial transport medium  Result Value Ref Range Status   SARS Coronavirus 2 by RT PCR NEGATIVE NEGATIVE Final    Comment: (NOTE) SARS-CoV-2 target nucleic acids are NOT DETECTED.  The SARS-CoV-2 RNA is generally detectable in upper respiratory specimens during the acute phase of infection. The lowest concentration of SARS-CoV-2 viral copies this assay can detect is 138 copies/mL. A negative result does not preclude SARS-Cov-2 infection and should not be used as the sole basis for treatment or other patient management decisions. A negative result may occur with  improper specimen collection/handling, submission of specimen other than nasopharyngeal swab, presence of viral mutation(s) within the areas targeted by this assay, and inadequate number of viral copies(<138 copies/mL). A negative result must be combined with clinical observations, patient history, and epidemiological information. The expected result is Negative.  Fact Sheet for Patients:  EntrepreneurPulse.com.au  Fact Sheet  for Healthcare Providers:  IncredibleEmployment.be  This test is no t yet approved or cleared by the [place] FDA and  has been authorized for detection and/or diagnosis of SARS-CoV-2 by FDA under an Emergency Use Authorization (EUA). This EUA will remain  in effect (meaning this test can be used) for the duration of the COVID-19 declaration under Section 564(b)(1) of the Act, 21 U.S.C.section 360bbb-3(b)(1), unless the authorization is terminated  or revoked sooner.       Influenza A by PCR NEGATIVE NEGATIVE Final   Influenza B by PCR NEGATIVE NEGATIVE Final    Comment: (NOTE) The Xpert Xpress SARS-CoV-2/FLU/RSV plus assay is intended as an aid in the diagnosis of  influenza from Nasopharyngeal swab specimens and should not be used as a sole basis for treatment. Nasal washings and aspirates are unacceptable for Xpert Xpress SARS-CoV-2/FLU/RSV testing.  Fact Sheet for Patients: EntrepreneurPulse.com.au  Fact Sheet for Healthcare Providers: IncredibleEmployment.be  This test is not yet approved or cleared by the [place] FDA and has been authorized for detection and/or diagnosis of SARS-CoV-2 by FDA under an Emergency Use Authorization (EUA). This EUA will remain in effect (meaning this test can be used) for the duration of the COVID-19 declaration under Section 564(b)(1) of the Act, 21 U.S.C. section 360bbb-3(b)(1), unless the authorization is terminated or revoked.  Performed at [facility], [place] [address] [place], [place] [address]   Aerobic/Anaerobic Culture w Gram Stain (surgical/deep wound)     Status: None (Preliminary result)   Collection Time: 07/16/21  4:20 PM   Specimen: Abscess  Result Value Ref Range Status   Specimen Description   Final    ABSCESS RIGHT ARM Performed at [place] [address] [place], [place] [address]    Special Requests   Final     NONE Performed at [facility], [place] [address] [place], [place] [address]    Gram Stain   Final    ABUNDANT WBC PRESENT,BOTH PMN AND MONONUCLEAR NO ORGANISMS SEEN    Culture   Final    NO GROWTH 2 DAYS Performed at [facility], [place] [address], [place], [place] [address]    Report Status PENDING  Incomplete  Aerobic/Anaerobic Culture w Gram Stain (surgical/deep wound)     Status: None (Preliminary result)   Collection Time: 07/16/21  4:25 PM   Specimen: Joint, Right Shoulder; Synovial Fluid  Result Value Ref Range Status   Specimen Description   Final    JOINT FLUID RIGHT SHOULDER Performed at [facility], [place] [address], [place], [place] [address]    Special Requests   Final    NONE Performed at [facility], [place] [address], [place], [place] [address]    Gram Stain   Final    FEW WBC PRESENT, PREDOMINANTLY PMN NO ORGANISMS SEEN    Culture   Final    NO GROWTH 2 DAYS Performed at [place] [address] [place], [place] [address]    Report Status PENDING  Incomplete  Resp Panel by RT-PCR (Flu A&B, Covid) Nasopharyngeal Swab     Status: None   Collection Time: 07/17/21 11:13 AM   Specimen: Nasopharyngeal Swab; Nasopharyngeal(NP) swabs in vial transport medium  Result Value Ref Range Status   SARS Coronavirus 2 by RT PCR NEGATIVE NEGATIVE Final    Comment: (NOTE) SARS-CoV-2 target nucleic acids are NOT DETECTED.  The SARS-CoV-2 RNA is generally detectable in upper respiratory specimens during the acute phase of infection. The lowest concentration of SARS-CoV-2 viral copies this assay can detect is 138 copies/mL. A negative result does not preclude SARS-Cov-2 infection and should not be used as the sole basis for treatment or other patient management decisions. A negative result may occur with  improper specimen collection/handling, submission of specimen other than nasopharyngeal swab, presence  of viral mutation(s) within the areas targeted by this assay, and inadequate number of viral copies(<138 copies/mL). A negative result must be combined with clinical observations, patient history, and epidemiological information. The expected result is Negative.  Fact Sheet for Patients:  EntrepreneurPulse.com.au  Fact Sheet for Healthcare Providers:  IncredibleEmployment.be  This test is  no t yet approved or cleared by the [place] and  has been authorized for detection and/or diagnosis of SARS-CoV-2 by FDA under an Emergency Use Authorization (EUA). This EUA will remain  in effect (meaning this test can be used) for the duration of the COVID-19 declaration under Section 564(b)(1) of the Act, 21 U.S.C.section 360bbb-3(b)(1), unless the authorization is terminated  or revoked sooner.       Influenza A by PCR NEGATIVE NEGATIVE Final   Influenza B by PCR NEGATIVE NEGATIVE Final    Comment: (NOTE) The Xpert Xpress SARS-CoV-2/FLU/RSV plus assay is intended as an aid in the diagnosis of influenza from Nasopharyngeal swab specimens and should not be used as a sole basis for treatment. Nasal washings and aspirates are unacceptable for Xpert Xpress SARS-CoV-2/FLU/RSV testing.  Fact Sheet for Patients: EntrepreneurPulse.com.au  Fact Sheet for Healthcare Providers: IncredibleEmployment.be  This test is not yet approved or cleared by the [place] FDA and has been authorized for detection and/or diagnosis of SARS-CoV-2 by FDA under an Emergency Use Authorization (EUA). This EUA will remain in effect (meaning this test can be used) for the duration of the COVID-19 declaration under Section 564(b)(1) of the Act, 21 U.S.C. section 360bbb-3(b)(1), unless the authorization is terminated or revoked.  Performed at [facility], [place] [address], [place], [place] [address]            Radiology Studies: IR US Guide Bx Asp/Drain  Result Date: 07/16/2021 INDICATION: Status post right shoulder hemiarthroplasty on 03/17/2021. Development of complex fluid collection of the anterior right upper arm adjacent to the humerus which may communicate with the glenohumeral joint by CT. Request made for sampling and drainage of the fluid collection and shoulder joint. EXAM: 1. ULTRASOUND-GUIDED ASPIRATION ABSCESS/FLUID COLLECTION OF THE RIGHT UPPER EXTREMITY SOFT TISSUES 2. ASPIRATION OF THE RIGHT GLENOHUMERAL JOINT UNDER FLUOROSCOPY MEDICATIONS: None ANESTHESIA/SEDATION: Fentanyl 100 mcg IV; Versed 2.0 mg IV Moderate Sedation Time:  22 minutes The patient was continuously monitored during the procedure by the interventional radiology nurse under my direct supervision. FLUOROSCOPY TIME:  36 seconds.  11.0 mGy. COMPLICATIONS: None immediate. PROCEDURE: Informed written consent was obtained from the patient and her son after a thorough discussion of the procedural risks, benefits and alternatives. All questions were addressed. Maximal Sterile Barrier Technique was utilized including caps, mask, sterile gowns, sterile gloves, sterile drape, hand hygiene and skin antiseptic. A timeout was performed prior to the initiation of the procedure. Under ultrasound guidance, an 18 gauge trocar needle was advanced to the level of a fluid collection in the right upper arm. Aspiration was performed in several different locations and a fluid sample sent for culture analysis. Under fluoroscopy, an 18 gauge spinal needle was advanced to the level of the glenohumeral joint. Aspiration was performed in 2 different locations. A fluid sample was sent for culture analysis. FINDINGS: Elongated and serpiginous complex fluid collection of the right upper arm approaches the skin. Aspiration at the level of the deeper component of this collection yielded a tiny amount of debris and no free fluid. Some of this debris appeared  purulent but was extremely thick in nature and difficult to aspirate. As the needle was moved into a more superficial location, additional bloody fluid was also able to be aspirated. In total, approximately 5 mL of bloody fluid mixed with purulent appearing debris was able to be aspirated and sent for culture analysis. Due to nature of the fluid, placement of a small caliber drain was  not indicated and would likely not drain well. Aspiration at the level of the glenohumeral joint after hemiarthroplasty yielded only approximately 2 mL of bloody fluid. This was mixed with a small amount of sterile saline and sent for culture analysis. IMPRESSION: 1. Ultrasound-guided aspiration at the level of a complex fluid collection in the right upper arm yielded bloody fluid and debris. Deeper component of this fluid collection yielded almost no fluid likely related to very thick internal contents. The more superficial component yielded bloody fluid. A total sample of 5 mL was able to be aspirated and sent for culture analysis. 2. Separate fluoroscopic guided aspiration at the level of the glenohumeral joint after prior hemiarthroplasty yielded approximately 2 mL of bloody fluid without gross purulence. This sample was sent separately for culture analysis. Electronically Signed   By: Aletta Edouard M.D.   On: 07/16/2021 17:55   IR DRAIN/INJ MAJOR JOINT/BURSA  Result Date: 07/16/2021 INDICATION: Status post right shoulder hemiarthroplasty on 03/17/2021. Development of complex fluid collection of the anterior right upper arm adjacent to the humerus which may communicate with the glenohumeral joint by CT. Request made for sampling and drainage of the fluid collection and shoulder joint. EXAM: 1. ULTRASOUND-GUIDED ASPIRATION ABSCESS/FLUID COLLECTION OF THE RIGHT UPPER EXTREMITY SOFT TISSUES 2. ASPIRATION OF THE RIGHT GLENOHUMERAL JOINT UNDER FLUOROSCOPY MEDICATIONS: None ANESTHESIA/SEDATION: Fentanyl 100 mcg IV; Versed 2.0 mg IV  Moderate Sedation Time:  22 minutes The patient was continuously monitored during the procedure by the interventional radiology nurse under my direct supervision. FLUOROSCOPY TIME:  36 seconds.  11.0 mGy. COMPLICATIONS: None immediate. PROCEDURE: Informed written consent was obtained from the patient and her son after a thorough discussion of the procedural risks, benefits and alternatives. All questions were addressed. Maximal Sterile Barrier Technique was utilized including caps, mask, sterile gowns, sterile gloves, sterile drape, hand hygiene and skin antiseptic. A timeout was performed prior to the initiation of the procedure. Under ultrasound guidance, an 18 gauge trocar needle was advanced to the level of a fluid collection in the right upper arm. Aspiration was performed in several different locations and a fluid sample sent for culture analysis. Under fluoroscopy, an 18 gauge spinal needle was advanced to the level of the glenohumeral joint. Aspiration was performed in 2 different locations. A fluid sample was sent for culture analysis. FINDINGS: Elongated and serpiginous complex fluid collection of the right upper arm approaches the skin. Aspiration at the level of the deeper component of this collection yielded a tiny amount of debris and no free fluid. Some of this debris appeared purulent but was extremely thick in nature and difficult to aspirate. As the needle was moved into a more superficial location, additional bloody fluid was also able to be aspirated. In total, approximately 5 mL of bloody fluid mixed with purulent appearing debris was able to be aspirated and sent for culture analysis. Due to nature of the fluid, placement of a small caliber drain was not indicated and would likely not drain well. Aspiration at the level of the glenohumeral joint after hemiarthroplasty yielded only approximately 2 mL of bloody fluid. This was mixed with a small amount of sterile saline and sent for culture  analysis. IMPRESSION: 1. Ultrasound-guided aspiration at the level of a complex fluid collection in the right upper arm yielded bloody fluid and debris. Deeper component of this fluid collection yielded almost no fluid likely related to very thick internal contents. The more superficial component yielded bloody fluid. A total sample of 5 mL  was able to be aspirated and sent for culture analysis. 2. Separate fluoroscopic guided aspiration at the level of the glenohumeral joint after prior hemiarthroplasty yielded approximately 2 mL of bloody fluid without gross purulence. This sample was sent separately for culture analysis. Electronically Signed   By: Aletta Edouard M.D.   On: 07/16/2021 17:55        Scheduled Meds:  carvedilol  6.25 mg Oral BID WC   cholecalciferol  2,000 Units Oral Daily   exemestane  25 mg Oral QPC breakfast   lactulose  20 g Oral TID   levothyroxine  50 mcg Oral QAC breakfast   magnesium oxide  400 mg Oral BID   pantoprazole  40 mg Oral Daily   pravastatin  20 mg Oral q1800   torsemide  40 mg Oral BID   vitamin B-12  2,000 mcg Oral Daily   Warfarin - Pharmacist Dosing Inpatient   Does not apply q1600   Continuous Infusions:  sodium chloride 250 mL (07/16/21 1319)   vancomycin 750 mg (07/18/21 1325)     LOS: 16 days    Time spent: 86 min    Georgette Shell, MD  07/18/2021, 2:28 PM

## 2021-07-18 NOTE — Progress Notes (Signed)
[place] for  warfarin Indication:  hx atrial fibrillation  Allergies  Allergen Reactions   Chlorhexidine Gluconate Hives   Penicillins Itching    Has patient had a PCN reaction causing immediate rash, facial/tongue/throat swelling, SOB or lightheadedness with hypotension: no Has patient had a PCN reaction causing severe rash involving mucus membranes or skin necrosis: No Has patient had a PCN reaction that required hospitalization: No Has patient had a PCN reaction occurring within the last 10 years: No If all of the above answers are "NO", then may proceed with Cephalosporin use.  Tolerated Cephalosporin Date: 03/17/21.    Patient Measurements: Height: 5\' 1"  (154.9 cm) Weight: 106 kg (233 lb 11 oz) IBW/kg (Calculated) : 47.8   Vital Signs: Temp: 98.9 F (37.2 C) (09/11 0617) Temp Source: Oral (09/11 0617) BP: 148/71 (09/11 0617) Pulse Rate: 82 (09/11 0617)  Labs: Recent Labs    07/16/21 0431 07/17/21 0526 07/18/21 0524  HGB 9.2*  --   --   HCT 29.2*  --   --   PLT 205  --   --   LABPROT 30.5* 37.3* 35.5*  INR 2.9* 3.8* 3.6*  CREATININE 1.36* 1.46* 1.38*    Estimated Creatinine Clearance: 39.5 mL/min (A) (by C-G formula based on SCr of 1.38 mg/dL (H)).  Medications:  - on warfarin 2.5mg  daily except 5mg  on Tue PTA (last dose taken on 8/24)  Assessment: Patient is a 75 y.o F with hx afib on warfarin PTA, presented to the ED on 8/25 with failure to thrive, She also reported of a fall one week ago and reports poor oral intake afterwards. Head CT was negative for any acute abnormality.  Pharmacy has been consulted to dose warfarin while she's hospitalized.  - Vitamin K 2.5mg  PO x 1 given on 8/26 and 8/29 for elevated INR - Warfarin resumed 8/30  Today, 07/18/2021: INR SUPRAtherapeutic again this AM (no warfarin 9/10) CBC: Hgb low but stable at 9.3, Pltc WNL Not eating much per charting  Goal of Therapy:  INR 2-3 Monitor  platelets by anticoagulation protocol: Yes   Plan:  NO warfarin again today Daily PT/INR Monitor CBC and for s/sx of bleeding    Adrian Saran, PharmD, BCPS Secure Chat if ?s 07/18/2021 10:31 AM

## 2021-07-18 NOTE — TOC Progression Note (Signed)
Transition of Care [facility]) - Progression Note    Patient Details  Name: Katelyn Jackson MRN: [id number] Date of Birth: 1945-12-09  Transition of [facility]) CM/SW [place], LCSW Phone Number: 07/18/2021, 1:46 PM  Clinical Narrative:     CSW informed Juliann Pulse from [place] Can be started.  Expected Discharge Plan: Skilled Nursing Facility Barriers to Discharge: Continued Medical Work up  Expected Discharge Plan and Services Expected Discharge Plan: [place]                                               Social Determinants of Health (SDOH) Interventions    Readmission Risk Interventions Readmission Risk Prevention Plan 07/06/2021 03/19/2021  Transportation Screening Complete Complete  HRI or [place] - Complete  Social Work Consult for [place] Planning/Counseling - Complete  Palliative Care Screening - Not Applicable  Medication Review Press photographer) - Complete  HRI or Home Care Consult Complete -  SW Recovery Care/Counseling Consult Complete -  Palliative Care Screening Not Applicable -  Skilled Nursing Facility Complete -  Some recent data might be hidden

## 2021-07-18 NOTE — Progress Notes (Signed)
Pharmacy Antibiotic Note  Katelyn Jackson is a 75 y.o. female admitted on 07/01/2021 with cellulitis.  Pharmacy has been consulted for vancomycin dosing.  Pt has been on doxycycline 100 mg PO BID with no improvement. Therefore antibiotic is being changed to vancomycin.   Day 4 Vanc  Afebrile WBC WNL SCr stable CrCl ~40 Shoulder and right arm abscess cultures with no growth to date  Plan: Continue vancomycin 750 mg IV q24h for now pending culture results Monitor clinical course, renal function, cultures as available   Height: 5\' 1"  (154.9 cm) Weight: 106 kg (233 lb 11 oz) IBW/kg (Calculated) : 47.8  Temp (24hrs), Avg:99 F (37.2 C), Min:98.9 F (37.2 C), Max:99.2 F (37.3 C)  Recent Labs  Lab 07/12/21 0437 07/13/21 0421 07/14/21 0450 07/16/21 0431 07/17/21 0526 07/18/21 0524  WBC 6.7 5.5 5.9 5.4  --   --   CREATININE  --  1.42* 1.51* 1.36* 1.46* 1.38*     Estimated Creatinine Clearance: 39.5 mL/min (A) (by C-G formula based on SCr of 1.38 mg/dL (H)).    Allergies  Allergen Reactions   Chlorhexidine Gluconate Hives   Penicillins Itching    Has patient had a PCN reaction causing immediate rash, facial/tongue/throat swelling, SOB or lightheadedness with hypotension: no Has patient had a PCN reaction causing severe rash involving mucus membranes or skin necrosis: No Has patient had a PCN reaction that required hospitalization: No Has patient had a PCN reaction occurring within the last 10 years: No If all of the above answers are "NO", then may proceed with Cephalosporin use.  Tolerated Cephalosporin Date: 03/17/21.     Thank you for allowing pharmacy to be a part of this patient's care.   Adrian Saran, PharmD, BCPS Secure Chat if ?s 07/18/2021 10:31 AM

## 2021-07-19 ENCOUNTER — Inpatient Hospital Stay (HOSPITAL_COMMUNITY): Payer: Medicare HMO

## 2021-07-19 ENCOUNTER — Encounter (HOSPITAL_COMMUNITY): Payer: Self-pay | Admitting: Internal Medicine

## 2021-07-19 DIAGNOSIS — L03116 Cellulitis of left lower limb: Secondary | ICD-10-CM | POA: Diagnosis not present

## 2021-07-19 LAB — BASIC METABOLIC PANEL
Anion gap: 9 (ref 5–15)
BUN: 31 mg/dL — ABNORMAL HIGH (ref 8–23)
CO2: 35 mmol/L — ABNORMAL HIGH (ref 22–32)
Calcium: 9.9 mg/dL (ref 8.9–10.3)
Chloride: 91 mmol/L — ABNORMAL LOW (ref 98–111)
Creatinine, Ser: 1.35 mg/dL — ABNORMAL HIGH (ref 0.44–1.00)
GFR, Estimated: 41 mL/min — ABNORMAL LOW (ref >60)
Glucose, Bld: 119 mg/dL — ABNORMAL HIGH (ref 70–99)
Potassium: 3 mmol/L — ABNORMAL LOW (ref 3.5–5.1)
Sodium: 135 mmol/L (ref 135–145)

## 2021-07-19 LAB — PROTIME-INR
INR: 4 — ABNORMAL HIGH (ref 0.8–1.2)
Prothrombin Time: 39 seconds — ABNORMAL HIGH (ref 11.4–15.2)

## 2021-07-19 LAB — VITAMIN B1: Vitamin B1 (Thiamine): 90.7 nmol/L (ref 66.5–200.0)

## 2021-07-19 MED ORDER — WITCH HAZEL-GLYCERIN EX PADS
MEDICATED_PAD | CUTANEOUS | Status: DC | PRN
  Filled 2021-07-19: qty 100

## 2021-07-19 MED ORDER — IOHEXOL 350 MG/ML SOLN
60.0000 mL | Freq: Once | INTRAVENOUS | Status: AC | PRN
  Administered 2021-07-19: 60 mL via INTRAVENOUS

## 2021-07-19 NOTE — Progress Notes (Signed)
Physical Therapy Treatment Patient Details Name: Katelyn Jackson MRN: [id number] DOB: 11-15-1945 Today's Date: 07/19/2021   History of Present Illness Katelyn Jackson is a 75 y.o. female brought by son to the ER for further evaluation of recent fall. Patient's son at the bedside is the historian:.  He tells me that patient fell from the bed on Monday and since then she is complaining of left shoulder pain, left ankle pain and bilateral knee pain, she is unable to walk, stand, go to bathroom and progressively declining. PMH: permanent A. fib, HTN, hyperlipidemia, s/p pacemaker placement, breast cancer, hypothyroidism, chronic bilateral lymphedema, recurrent falls, obesity, GERD, pulmonary hypertension    PT Comments    Patient making slow progress towards goals but had improved rise and clearance to stand from EOB today. Pt required Max+2/Total assist for all mobility and stood 2x from EOB. Pt was fatigued at EOS and was limited by knee and foot pain in standing. Acute PT will continue to progress pt as able; SNF recommendation remains appropriate.    Recommendations for follow up therapy are one component of a multi-disciplinary discharge planning process, led by the attending physician.  Recommendations may be updated based on patient status, additional functional criteria and insurance authorization.  Follow Up Recommendations  SNF     Equipment Recommendations  None recommended by PT    Recommendations for Other Services       Precautions / Restrictions Precautions Precautions: Fall Precaution Comments: leg pain, right shoulder TSA now painful Restrictions Weight Bearing Restrictions: No     Mobility  Bed Mobility Overal bed mobility: Needs Assistance Bed Mobility: Supine to Sit;Sit to Supine;Rolling Rolling: Total assist   Supine to sit: +2 for physical assistance;+2 for safety/equipment;Max assist;Total assist;HOB elevated Sit to supine: Total assist;Max assist;+2 for  physical assistance;+2 for safety/equipment   General bed mobility comments: Max+2 assist for rolling to Rt side and to bring Bil LE's off EOB and raise trunk upright. pt limited by pain for use of Rt UE. Bed pad used to scoot towards EOB. Max +2 to return to supine and Total Assist to roll Rt/Lt to cleanup from BM in standing.    Transfers Overall transfer level: Needs assistance Equipment used: Rolling walker (2 wheeled) Transfers: Sit to/from Stand Sit to Stand: Max assist;+2 physical assistance;+2 safety/equipment;From elevated surface         General transfer comment: Pt completed 2x sit<>stand from EOB and rose ~75% of the way to standing with 2+max assist and 3rd person guarding at back to prevent posterior lean with return to sitting.  Ambulation/Gait                 Stairs             Wheelchair Mobility    Modified Rankin (Stroke Patients Only)       Balance Overall balance assessment: Needs assistance Sitting-balance support: No upper extremity supported Sitting balance-Leahy Scale: Fair Sitting balance - Comments: able to sit edge of bed without upper extremity support   Standing balance support: Bilateral upper extremity supported Standing balance-Leahy Scale: Zero Standing balance comment: heavy reliance on RW and external support of therapist                            Cognition Arousal/Alertness: Awake/alert Behavior During Therapy: WFL for tasks assessed/performed Overall Cognitive Status: Within Functional Limits for tasks assessed  General Comments: Patient able to understand some english but son there to intepret as well.      Exercises      General Comments        Pertinent Vitals/Pain Pain Assessment: Faces Faces Pain Scale: Hurts whole lot Pain Location: generalized but the most in right knee and bil feet Pain Descriptors / Indicators: Grimacing;Moaning;Guarding Pain  Intervention(s): Limited activity within patient's tolerance;Monitored during session;Repositioned    Home Living                      Prior Function            PT Goals (current goals can now be found in the care plan section) Acute Rehab PT Goals Patient Stated Goal: to stand and walk again PT Goal Formulation: With family Time For Goal Achievement: 08/02/21 (extended by 2 weeks) Potential to Achieve Goals: Fair Progress towards PT goals: Progressing toward goals (very slow)    Frequency    Min 2X/week      PT Plan Current plan remains appropriate    Co-evaluation              AM-PAC PT "6 Clicks" Mobility   Outcome Measure  Help needed turning from your back to your side while in a flat bed without using bedrails?: Total Help needed moving from lying on your back to sitting on the side of a flat bed without using bedrails?: Total Help needed moving to and from a bed to a chair (including a wheelchair)?: Total Help needed standing up from a chair using your arms (e.g., wheelchair or bedside chair)?: Total Help needed to walk in hospital room?: Total Help needed climbing 3-5 steps with a railing? : Total 6 Click Score: 6    End of Session Equipment Utilized During Treatment: Gait belt Activity Tolerance: Patient limited by fatigue;Patient limited by pain Patient left: in bed;with call bell/phone within reach;with bed alarm set Nurse Communication: Mobility status PT Visit Diagnosis: Other abnormalities of gait and mobility (R26.89);Muscle weakness (generalized) (M62.81)     Time: 4628-6381 PT Time Calculation (min) (ACUTE ONLY): 29 min  Charges:  $Therapeutic Activity: 23-37 mins                     Verner Mould, DPT Acute Rehabilitation Services Office [phone] Pager [phone]    Jacques Navy 07/19/2021, 3:21 PM

## 2021-07-19 NOTE — Plan of Care (Signed)
  Problem: Activity: Goal: Risk for activity intolerance will decrease Outcome: Progressing   Problem: Elimination: Goal: Will not experience complications related to bowel motility Outcome: Progressing   Problem: Pain Managment: Goal: General experience of comfort will improve Outcome: Progressing   Problem: Safety: Goal: Ability to remain free from injury will improve Outcome: Progressing   

## 2021-07-19 NOTE — Progress Notes (Addendum)
   ORTHOPAEDIC PROGRESS NOTE  SUBJECTIVE: Patient had ultrasound guided aspiration of right upper arm abscess and fluoro guided aspiration of right shoulder joint  on 07/16/2021 with IR. Ws found to have purulent debris superficially and only a few mL of bloody fluid in the joint, per IR note. Cultures so far have been negative.  Patient was febrile last night despite being on vancomycin.   Patient states her shoulder feels "some better" today. Complaining more of pain in her lower legs than in her shoulder.   OBJECTIVE: PE: General: NAD Right shoulder: bullae is still present, but surrounding erythema has improved. Pain with range of motion. Limited examination due to patient cooperation stating she is "too tired" Bilateral lower extremity: chronic lymphedema. Tender to palpation ankle and feet. Wiggles toes only slightly.    Vitals:   07/19/21 0951 07/19/21 1342  BP: 124/64 139/62  Pulse: 60 90  Resp:  18  Temp:  98 F (36.7 C)  SpO2:  92%     ASSESSMENT: Katelyn Jackson is a 75 y.o. female  - s/p right shoulder hemiarthroplasty for unreconstructable shoulder in May 2022 - right shoulder abscess s/p aspiration on 07/16/2021 with IR - bilateral knee osteoarthritis - chronic bilateral leg lymphedema - morbid obesity   PLAN: - Right shoulder: Patient's shoulder chronically appears dislocated due to her unreconstructable shoulder from previous right humerus and scapula fractures with end-stage degenerative changes in the joint. She had a right hemiarthroplasty in 03/2021. Appearance of a dislocated shoulder is chronic.   - s/p aspiration of superficial abscess and joint on 07/16/2021 with Interventional Radiology  - Cultures currently show no growth, will continue to follow - CT w contrast has been ordered by ID  Treatment plan will depend on results of CT scan, clinical improvement, and patient's goals of care. Currently INR is too high to proceed with any sort of surgical  intervention. Will discuss with patient's son about her options once CT results are in.   Patient currently complaining of more pain in her lower extremity. Will order bilateral LE doppler study. May also benefit from Prevalon boots.    [name], PA-C 07/19/2021

## 2021-07-19 NOTE — Progress Notes (Signed)
[place] for  warfarin Indication:  hx atrial fibrillation  Allergies  Allergen Reactions   Chlorhexidine Gluconate Hives   Penicillins Itching    Has patient had a PCN reaction causing immediate rash, facial/tongue/throat swelling, SOB or lightheadedness with hypotension: no Has patient had a PCN reaction causing severe rash involving mucus membranes or skin necrosis: No Has patient had a PCN reaction that required hospitalization: No Has patient had a PCN reaction occurring within the last 10 years: No If all of the above answers are "NO", then may proceed with Cephalosporin use.  Tolerated Cephalosporin Date: 03/17/21.    Patient Measurements: Height: 5\' 1"  (154.9 cm) Weight: 104.3 kg (229 lb 15 oz) IBW/kg (Calculated) : 47.8   Vital Signs: Temp: 99.8 F (37.7 C) (09/12 0300) Temp Source: Oral (09/12 0149) BP: 124/64 (09/12 0951) Pulse Rate: 60 (09/12 0951)  Labs: Recent Labs    07/17/21 0526 07/18/21 0524 07/19/21 0430  LABPROT 37.3* 35.5* 39.0*  INR 3.8* 3.6* 4.0*  CREATININE 1.46* 1.38* 1.35*    Estimated Creatinine Clearance: 40 mL/min (A) (by C-G formula based on SCr of 1.35 mg/dL (H)).  Medications:  - on warfarin 2.5mg  daily except 5mg  on Tue PTA (last dose taken on 8/24)  Assessment: Patient is a 75 y.o F with hx afib on warfarin PTA, presented to the ED on 8/25 with failure to thrive, She also reported of a fall one week ago and reports poor oral intake afterwards. Head CT was negative for any acute abnormality.  Pharmacy has been consulted to dose warfarin while she's hospitalized.  - Vitamin K 2.5mg  PO x 1 given on 8/26 and 8/29 for elevated INR - Warfarin resumed 8/30  Today, 07/19/2021: INR remains supratherapeutic and rising despite last dose warfarin on 9/9 CBC (9/9): Hgb low but stable at 9.3, Pltc WNL Not eating much per charting  Goal of Therapy:  INR 2-3 Monitor platelets by anticoagulation  protocol: Yes   Plan:  Continue to hold warfarin Daily PT/INR Check CBC tomorrow   Reuel Boom, PharmD, BCPS [phone] 07/19/2021, 10:39 AM

## 2021-07-19 NOTE — Progress Notes (Signed)
PROGRESS NOTE    Katelyn Jackson  DGU:[id number] DOB: 06/17/1946 DOA: 07/01/2021 PCP: Cassandria Anger, MD   Brief Narrative: 75 years old female with PMH significant for permanent A. fib on Coumadin, hypertension, hyperlipidemia, pacemaker in place, history of breast cancer, hypothyroidism, chronic bilateral lymphedema, recurrent falls, morbid obesity, BMI 51, GERD, pulmonary hypertension who needs full assistance for daily life activities, She has home health aide 6 times a week at home,  brought to the ED by her son for the evaluation of recent fall on last Monday.  Patient fell from bed on Monday since then complaining about left shoulder pain,  left ankle pain and bilateral knee pain and unable to walk or stand, go to the bathroom and progressively declining. She was found to have left lower extremity cellulitis.  Patient completed antibiotics.  Cellulitis has resolved.  Patient was given vitamin K due to supratherapeutic INR.  She was also given Lasix for CHF exacerbation.  She was managed with IV antibiotics for cellulitis,  IV Lasix for CHF.  ABG showed CO2 retention-chronic hypercapnia compensated.  Assessment & Plan:   Principal Problem:   Cellulitis of left lower extremity Active Problems:   Essential hypertension   Chronic atrial fibrillation (HCC)   Lymphedema   Anemia of chronic disease   CRF (chronic renal failure), stage 4 (severe) (HCC)   Morbid obesity (HCC)   GERD (gastroesophageal reflux disease)   Hypothyroidism   Supratherapeutic INR   Pulmonary edema   Pressure injury of skin  Cellulitis of left lower extremity/ Chronic bilateral lymphedema: Completed antibiotics.  Erythema and warmth has resolved.   Recurrent falls / Ambulatory dysfunction / moderate to severe osteoarthritis: Patient had complete skeletal survey.  X-ray of the knees and left ankle: No fracture Right hip: Degenerative disc pain.  Patient has chronic pain for years. Dr. Para March orthopedics  was consulted.  Adequate pain control with oral and IV opiates. Despite the need for bilateral total knee replacements,  She is certainly not a surgical candidate from cardiac, renal and body habitus standpoint. PT and OT recommended  skilled nursing facility.   Acute hypoxic respiratory failure could be multifactorial:  > Resolved. secondary to fluid overload,  acute on chronic systolic CHF, hypercapnia likely from OSA. Respiratory status much improved.  Patient had adequate diuresis with negative fluid balance. Continue supplemental oxygen to keep saturation above 94%. Diuretics changed to torsemide po 40 mg twice daily. She is now 93% on room air.   AKI on CKD stage IIIb: Baseline serum creatinine 1.4-1.6 Creatinine improved with torsemide and  remained stable. Avoid nephrotoxic medications.   Essential hypertension: Blood pressure 121/62 decrease the dose of Coreg   Chronic atrial fibrillation: Continue Coreg, heart rate controlled. Coumadin was on hold because of supratherapeutic INR.   INR today is 2.8 Restart Coumadin   Pacemaker in place: Stable.  Hypothyroidism: TSH 2.7 on 07/02/2021 Continue levothyroxine.   Anemia of chronic disease: Hemoglobin remained stable, no obvious bleeding.   History of breast cancer: Outpatient follow-up.   Right shoulder pain and swelling: Venous duplex ruled out DVT. Right shoulder x-ray and CT scan showed  chronic anterior shoulder dislocation. Patient was on doxycycline which was changed to vancomycin due to persistent fever and pain.  Patient spiked fever again last night on vancomycin.  We will CT the shoulder again today.  Discussed with Dr. Drucilla Schmidt who will see the patient today.  Patient has a stitch abscess in the right shoulder she is status post incision  and drainage by interventional radiology on 07/16/2021.  Cultures are negative so far.  Continue vancomycin..  Encephalopathy-resolved work-up including ABG B12 folate RPR and  ammonia negative.  Constipation resolved. Pressure Injury 07/03/21 Thigh Posterior;Right Stage 3 -  Full thickness tissue loss. Subcutaneous fat may be visible but bone, tendon or muscle are NOT exposed. (Active)  07/03/21 0453  Location: Thigh  Location Orientation: Posterior;Right  Staging: Stage 3 -  Full thickness tissue loss. Subcutaneous fat may be visible but bone, tendon or muscle are NOT exposed.  Wound Description (Comments):   Present on Admission: Yes     Pressure Injury 07/03/21 Sacrum Stage 2 -  Partial thickness loss of dermis presenting as a shallow open injury with a red, pink wound bed without slough. (Active)  07/03/21 2105  Location: Sacrum  Location Orientation:   Staging: Stage 2 -  Partial thickness loss of dermis presenting as a shallow open injury with a red, pink wound bed without slough.  Wound Description (Comments):   Present on Admission: Yes    Estimated body mass index is 43.45 kg/m as calculated from the following:   Height as of this encounter: 5\' 1"  (1.549 m).   Weight as of this encounter: 104.3 kg.  DVT prophylaxis: Coumadin  code Status: Full code Family Communication: None at bedside  disposition Plan:  Status is: Inpatient  Remains inpatient appropriate because:Hemodynamically unstable  Dispo: The patient is from: Home              Anticipated d/c is to: SNF              Patient currently is not medically stable to d/c.   Difficult to place patient No       Consultants:  Ortho  Procedures: None Antimicrobials: Doxycycline  Subjective: She reports moving her bowels yesterday after stool softeners and laxatives and enemas. She is spiked fever overnight she is on vancomycin. Cultures so far no growth. Objective: Vitals:   07/19/21 0300 07/19/21 0434 07/19/21 0951 07/19/21 1342  BP:   124/64 139/62  Pulse:   60 90  Resp:    18  Temp: 99.8 F (37.7 C)   98 F (36.7 C)  TempSrc:    Oral  SpO2:    92%  Weight:  104.3 kg     Height:        Intake/Output Summary (Last 24 hours) at 07/19/2021 1400 Last data filed at 07/18/2021 2030 Gross per 24 hour  Intake --  Output 1350 ml  Net -1350 ml    Filed Weights   07/17/21 0520 07/18/21 0500 07/19/21 0434  Weight: 110.5 kg 106 kg 104.3 kg    Examination:  General exam: Appears calm and comfortable  Respiratory system: Clear to auscultation. Respiratory effort normal. Cardiovascular system: S1 & S2 heard, RRR. No JVD, murmurs, rubs, gallops or clicks.. Gastrointestinal system: Abdomen is nondistended, soft and nontender. No organomegaly or masses felt. Normal bowel sounds heard. Central nervous system: Alert and oriented. No focal neurological deficits. Extremities: Bilateral lower extremity and right upper extremity edema.  Persistent bullae in the distal aspect of the right shoulder still present. Skin: No rashes, lesions or ulcers Psychiatry: Difficult to assess due to language barrier   Data Reviewed: I have personally reviewed following labs and imaging studies  CBC: Recent Labs  Lab 07/13/21 0421 07/14/21 0450 07/16/21 0431  WBC 5.5 5.9 5.4  HGB 9.4* 9.3* 9.2*  HCT 29.9* 29.4* 29.2*  MCV 93.4 93.9 94.2  PLT 239 221 829    Basic Metabolic Panel: Recent Labs  Lab 07/13/21 0421 07/14/21 0450 07/16/21 0431 07/17/21 0526 07/18/21 0524 07/19/21 0430  NA 136 136 133* 135 136 135  K 3.2* 3.5 3.1* 3.1* 3.3* 3.0*  CL 94* 94* 91* 90* 92* 91*  CO2 37* 36* 33* 34* 35* 35*  GLUCOSE 97 105* 95 94 118* 119*  BUN 32* 33* 30* 34* 33* 31*  CREATININE 1.42* 1.51* 1.36* 1.46* 1.38* 1.35*  CALCIUM 9.9 10.2 9.8 10.1 10.2 9.9  MG 2.4  --  2.3  --   --   --   PHOS 3.2  --   --   --   --   --     GFR: Estimated Creatinine Clearance: 40 mL/min (A) (by C-G formula based on SCr of 1.35 mg/dL (H)). Liver Function Tests: Recent Labs  Lab 07/16/21 0431  AST 26  ALT 21  ALKPHOS 373*  BILITOT 2.8*  PROT 6.1*  ALBUMIN 2.1*    No results for  input(s): LIPASE, AMYLASE in the last 168 hours. Recent Labs  Lab 07/16/21 0431  AMMONIA 31    Coagulation Profile: Recent Labs  Lab 07/15/21 0431 07/16/21 0431 07/17/21 0526 07/18/21 0524 07/19/21 0430  INR 2.4* 2.9* 3.8* 3.6* 4.0*    Cardiac Enzymes: No results for input(s): CKTOTAL, CKMB, CKMBINDEX, TROPONINI in the last 168 hours. BNP (last 3 results) No results for input(s): PROBNP in the last 8760 hours. HbA1C: No results for input(s): HGBA1C in the last 72 hours. CBG: Recent Labs  Lab 07/13/21 0202  GLUCAP 100*    Lipid Profile: No results for input(s): CHOL, HDL, LDLCALC, TRIG, CHOLHDL, LDLDIRECT in the last 72 hours. Thyroid Function Tests: No results for input(s): TSH, T4TOTAL, FREET4, T3FREE, THYROIDAB in the last 72 hours. Anemia Panel: No results for input(s): VITAMINB12, FOLATE, FERRITIN, TIBC, IRON, RETICCTPCT in the last 72 hours.  Sepsis Labs: No results for input(s): PROCALCITON, LATICACIDVEN in the last 168 hours.  Recent Results (from the past 240 hour(s))  Resp Panel by RT-PCR (Flu A&B, Covid) Nasopharyngeal Swab     Status: None   Collection Time: 07/13/21  6:05 PM   Specimen: Nasopharyngeal Swab; Nasopharyngeal(NP) swabs in vial transport medium  Result Value Ref Range Status   SARS Coronavirus 2 by RT PCR NEGATIVE NEGATIVE Final    Comment: (NOTE) SARS-CoV-2 target nucleic acids are NOT DETECTED.  The SARS-CoV-2 RNA is generally detectable in upper respiratory specimens during the acute phase of infection. The lowest concentration of SARS-CoV-2 viral copies this assay can detect is 138 copies/mL. A negative result does not preclude SARS-Cov-2 infection and should not be used as the sole basis for treatment or other patient management decisions. A negative result may occur with  improper specimen collection/handling, submission of specimen other than nasopharyngeal swab, presence of viral mutation(s) within the areas targeted by this  assay, and inadequate number of viral copies(<138 copies/mL). A negative result must be combined with clinical observations, patient history, and epidemiological information. The expected result is Negative.  Fact Sheet for Patients:  EntrepreneurPulse.com.au  Fact Sheet for Healthcare Providers:  IncredibleEmployment.be  This test is no t yet approved or cleared by the [place] FDA and  has been authorized for detection and/or diagnosis of SARS-CoV-2 by FDA under an Emergency Use Authorization (EUA). This EUA will remain  in effect (meaning this test can be used) for the duration of the COVID-19 declaration under Section 564(b)(1) of the Act, 21  U.S.C.section 360bbb-3(b)(1), unless the authorization is terminated  or revoked sooner.       Influenza A by PCR NEGATIVE NEGATIVE Final   Influenza B by PCR NEGATIVE NEGATIVE Final    Comment: (NOTE) The Xpert Xpress SARS-CoV-2/FLU/RSV plus assay is intended as an aid in the diagnosis of influenza from Nasopharyngeal swab specimens and should not be used as a sole basis for treatment. Nasal washings and aspirates are unacceptable for Xpert Xpress SARS-CoV-2/FLU/RSV testing.  Fact Sheet for Patients: EntrepreneurPulse.com.au  Fact Sheet for Healthcare Providers: IncredibleEmployment.be  This test is not yet approved or cleared by the [place] FDA and has been authorized for detection and/or diagnosis of SARS-CoV-2 by FDA under an Emergency Use Authorization (EUA). This EUA will remain in effect (meaning this test can be used) for the duration of the COVID-19 declaration under Section 564(b)(1) of the Act, 21 U.S.C. section 360bbb-3(b)(1), unless the authorization is terminated or revoked.  Performed at [facility], [place] [address], [place], [place] [address]   Aerobic/Anaerobic Culture w Gram Stain (surgical/deep wound)      Status: None (Preliminary result)   Collection Time: 07/16/21  4:20 PM   Specimen: Abscess  Result Value Ref Range Status   Specimen Description   Final    ABSCESS RIGHT ARM Performed at [place] [address], [place], [place] [address]    Special Requests   Final    NONE Performed at [facility], [place] [address], [place], [place] [address]    Gram Stain   Final    ABUNDANT WBC PRESENT,BOTH PMN AND MONONUCLEAR NO ORGANISMS SEEN    Culture   Final    NO GROWTH 3 DAYS NO ANAEROBES ISOLATED; CULTURE IN PROGRESS FOR 5 DAYS Performed at [facility], [place] [address], [place], [place] [address]    Report Status PENDING  Incomplete  Aerobic/Anaerobic Culture w Gram Stain (surgical/deep wound)     Status: None (Preliminary result)   Collection Time: 07/16/21  4:25 PM   Specimen: Joint, Right Shoulder; Synovial Fluid  Result Value Ref Range Status   Specimen Description   Final    JOINT FLUID RIGHT SHOULDER Performed at [facility], [place] [address], [place], [place] [address]    Special Requests   Final    NONE Performed at [facility], [place] [address], [place], [place] [address]    Gram Stain   Final    FEW WBC PRESENT, PREDOMINANTLY PMN NO ORGANISMS SEEN    Culture   Final    NO GROWTH 3 DAYS NO ANAEROBES ISOLATED; CULTURE IN PROGRESS FOR 5 DAYS Performed at [place] [address] [place], [place] [address]    Report Status PENDING  Incomplete  Resp Panel by RT-PCR (Flu A&B, Covid) Nasopharyngeal Swab     Status: None   Collection Time: 07/17/21 11:13 AM   Specimen: Nasopharyngeal Swab; Nasopharyngeal(NP) swabs in vial transport medium  Result Value Ref Range Status   SARS Coronavirus 2 by RT PCR NEGATIVE NEGATIVE Final    Comment: (NOTE) SARS-CoV-2 target nucleic acids are NOT DETECTED.  The SARS-CoV-2 RNA is generally detectable in upper respiratory specimens during the  acute phase of infection. The lowest concentration of SARS-CoV-2 viral copies this assay can detect is 138 copies/mL. A negative result does not preclude SARS-Cov-2 infection and should not be used as the sole basis for treatment or other patient management decisions. A negative  result may occur with  improper specimen collection/handling, submission of specimen other than nasopharyngeal swab, presence of viral mutation(s) within the areas targeted by this assay, and inadequate number of viral copies(<138 copies/mL). A negative result must be combined with clinical observations, patient history, and epidemiological information. The expected result is Negative.  Fact Sheet for Patients:  EntrepreneurPulse.com.au  Fact Sheet for Healthcare Providers:  IncredibleEmployment.be  This test is no t yet approved or cleared by the [place] FDA and  has been authorized for detection and/or diagnosis of SARS-CoV-2 by FDA under an Emergency Use Authorization (EUA). This EUA will remain  in effect (meaning this test can be used) for the duration of the COVID-19 declaration under Section 564(b)(1) of the Act, 21 U.S.C.section 360bbb-3(b)(1), unless the authorization is terminated  or revoked sooner.       Influenza A by PCR NEGATIVE NEGATIVE Final   Influenza B by PCR NEGATIVE NEGATIVE Final    Comment: (NOTE) The Xpert Xpress SARS-CoV-2/FLU/RSV plus assay is intended as an aid in the diagnosis of influenza from Nasopharyngeal swab specimens and should not be used as a sole basis for treatment. Nasal washings and aspirates are unacceptable for Xpert Xpress SARS-CoV-2/FLU/RSV testing.  Fact Sheet for Patients: EntrepreneurPulse.com.au  Fact Sheet for Healthcare Providers: IncredibleEmployment.be  This test is not yet approved or cleared by the [place] FDA and has been authorized for detection and/or  diagnosis of SARS-CoV-2 by FDA under an Emergency Use Authorization (EUA). This EUA will remain in effect (meaning this test can be used) for the duration of the COVID-19 declaration under Section 564(b)(1) of the Act, 21 U.S.C. section 360bbb-3(b)(1), unless the authorization is terminated or revoked.  Performed at [facility], [place] [address], [place], [place] [address]           Radiology Studies: No results found.      Scheduled Meds:  carvedilol  6.25 mg Oral BID WC   cholecalciferol  2,000 Units Oral Daily   exemestane  25 mg Oral QPC breakfast   levothyroxine  50 mcg Oral QAC breakfast   magnesium oxide  400 mg Oral BID   pantoprazole  40 mg Oral Daily   pravastatin  20 mg Oral q1800   torsemide  40 mg Oral BID   vitamin B-12  2,000 mcg Oral Daily   Warfarin - Pharmacist Dosing Inpatient   Does not apply q1600   Continuous Infusions:  sodium chloride 250 mL (07/16/21 1319)   vancomycin 750 mg (07/19/21 1305)     LOS: 17 days    Time spent: 44 min    Georgette Shell, MD  07/19/2021, 2:00 PM

## 2021-07-19 NOTE — TOC Progression Note (Signed)
Transition of Care [facility]) - Progression Note    Patient Details  Name: Katelyn Jackson MRN: [id number] Date of Birth: 03/16/1946  [facility] CM/SW Contact  Lonette Stevison, Juliann Pulse, RN Phone Number: 07/19/2021, 3:04 PM  Clinical Narrative:  [facility] received auth, covid neg. Patient not medically stable.    Expected Discharge Plan: Skilled Nursing Facility Barriers to Discharge: Continued Medical Work up  Expected Discharge Plan and Services Expected Discharge Plan: [place]                                               Social Determinants of Health (SDOH) Interventions    Readmission Risk Interventions Readmission Risk Prevention Plan 07/06/2021 03/19/2021  Transportation Screening Complete Complete  HRI or [place] - Complete  Social Work Consult for [place] Planning/Counseling - Complete  Palliative Care Screening - Not Applicable  Medication Review [employer] photographer) - Complete  HRI or Home Care Consult Complete -  SW Recovery Care/Counseling Consult Complete -  Palliative Care Screening Not Applicable -  Skilled Nursing Facility Complete -  Some recent data might be hidden

## 2021-07-20 ENCOUNTER — Telehealth: Payer: Self-pay

## 2021-07-20 ENCOUNTER — Ambulatory Visit: Payer: Medicare HMO

## 2021-07-20 ENCOUNTER — Inpatient Hospital Stay (HOSPITAL_COMMUNITY): Payer: Medicare HMO

## 2021-07-20 DIAGNOSIS — T8450XA Infection and inflammatory reaction due to unspecified internal joint prosthesis, initial encounter: Secondary | ICD-10-CM

## 2021-07-20 DIAGNOSIS — M79604 Pain in right leg: Secondary | ICD-10-CM | POA: Diagnosis not present

## 2021-07-20 DIAGNOSIS — M79605 Pain in left leg: Secondary | ICD-10-CM | POA: Diagnosis not present

## 2021-07-20 DIAGNOSIS — L03116 Cellulitis of left lower limb: Secondary | ICD-10-CM | POA: Diagnosis not present

## 2021-07-20 DIAGNOSIS — M009 Pyogenic arthritis, unspecified: Secondary | ICD-10-CM

## 2021-07-20 DIAGNOSIS — J811 Chronic pulmonary edema: Secondary | ICD-10-CM

## 2021-07-20 DIAGNOSIS — I890 Lymphedema, not elsewhere classified: Secondary | ICD-10-CM

## 2021-07-20 DIAGNOSIS — N184 Chronic kidney disease, stage 4 (severe): Secondary | ICD-10-CM | POA: Diagnosis not present

## 2021-07-20 DIAGNOSIS — Z950 Presence of cardiac pacemaker: Secondary | ICD-10-CM

## 2021-07-20 DIAGNOSIS — I482 Chronic atrial fibrillation, unspecified: Secondary | ICD-10-CM | POA: Diagnosis not present

## 2021-07-20 DIAGNOSIS — D638 Anemia in other chronic diseases classified elsewhere: Secondary | ICD-10-CM

## 2021-07-20 DIAGNOSIS — R791 Abnormal coagulation profile: Secondary | ICD-10-CM

## 2021-07-20 LAB — CBC
HCT: 30.9 % — ABNORMAL LOW (ref 36.0–46.0)
Hemoglobin: 9.7 g/dL — ABNORMAL LOW (ref 12.0–15.0)
MCH: 29.8 pg (ref 26.0–34.0)
MCHC: 31.4 g/dL (ref 30.0–36.0)
MCV: 95.1 fL (ref 80.0–100.0)
Platelets: 215 10*3/uL (ref 150–400)
RBC: 3.25 MIL/uL — ABNORMAL LOW (ref 3.87–5.11)
RDW: 16.3 % — ABNORMAL HIGH (ref 11.5–15.5)
WBC: 10.3 10*3/uL (ref 4.0–10.5)
nRBC: 0 % (ref 0.0–0.2)

## 2021-07-20 LAB — PROTIME-INR
INR: 4.9 (ref 0.8–1.2)
Prothrombin Time: 45.9 seconds — ABNORMAL HIGH (ref 11.4–15.2)

## 2021-07-20 MED ORDER — SIMETHICONE 40 MG/0.6ML PO SUSP
20.0000 mg | Freq: Four times a day (QID) | ORAL | Status: DC | PRN
  Administered 2021-07-20 – 2021-07-30 (×7): 20 mg via ORAL
  Filled 2021-07-20 (×10): qty 0.6

## 2021-07-20 MED ORDER — POTASSIUM CHLORIDE CRYS ER 20 MEQ PO TBCR
40.0000 meq | EXTENDED_RELEASE_TABLET | Freq: Once | ORAL | Status: AC
  Administered 2021-07-20: 40 meq via ORAL
  Filled 2021-07-20: qty 2

## 2021-07-20 MED ORDER — POTASSIUM CHLORIDE CRYS ER 20 MEQ PO TBCR
20.0000 meq | EXTENDED_RELEASE_TABLET | Freq: Every day | ORAL | Status: DC
  Administered 2021-07-22 – 2021-07-30 (×9): 20 meq via ORAL
  Filled 2021-07-20 (×10): qty 1

## 2021-07-20 NOTE — Telephone Encounter (Signed)
Pt is overdue for coumadin clinic apt from July but pt is currently admitted.

## 2021-07-20 NOTE — Progress Notes (Signed)
[facility] for Warfarin Indication:  History of Atrial Fibrillation (on PTA)  Allergies  Allergen Reactions   Chlorhexidine Gluconate Hives   Penicillins Itching    Has patient had a PCN reaction causing immediate rash, facial/tongue/throat swelling, SOB or lightheadedness with hypotension: no Has patient had a PCN reaction causing severe rash involving mucus membranes or skin necrosis: No Has patient had a PCN reaction that required hospitalization: No Has patient had a PCN reaction occurring within the last 10 years: No If all of the above answers are "NO", then may proceed with Cephalosporin use.  Tolerated Cephalosporin Date: 03/17/21.     Patient Measurements: Height: 5\' 1"  (154.9 cm) Weight: 104.3 kg (229 lb 15 oz) IBW/kg (Calculated) : 47.8 Heparin Dosing Weight:  Vital Signs: Temp: 99.3 F (37.4 C) (09/13 0500) Temp Source: Oral (09/13 0500) BP: 115/69 (09/13 0500) Pulse Rate: 71 (09/13 0558)  Labs: Recent Labs    07/18/21 0524 07/19/21 0430 07/20/21 0400  HGB  --   --  9.7*  HCT  --   --  30.9*  PLT  --   --  215  LABPROT 35.5* 39.0* 45.9*  INR 3.6* 4.0* 4.9*  CREATININE 1.38* 1.35*  --     Estimated Creatinine Clearance: 40 mL/min (A) (by C-G formula based on SCr of 1.35 mg/dL (H)).   Medications:  Medications Prior to Admission  Medication Sig Dispense Refill Last Dose   allopurinol (ZYLOPRIM) 100 MG tablet Take 0.5 tablets (50 mg total) by mouth daily. (Patient taking differently: Take 100 mg by mouth daily.) 45 tablet 3 06/30/2021   carvedilol (COREG) 25 MG tablet Take 1 tablet (25 mg total) by mouth 2 (two) times daily. 180 tablet 3 06/30/2021 at 2200   Cholecalciferol (VITAMIN D3) 2000 units capsule Take 1 capsule (2,000 Units total) by mouth daily. 100 capsule 3 06/30/2021   Cyanocobalamin (VITAMIN B-12 PO) Take 2,000 mcg by mouth daily.   06/30/2021   diclofenac Sodium (VOLTAREN) 1 % GEL APPLY FOUR GRAMS FOUR  TIMES DAILY AS NEEDED (Patient taking differently: Apply 4 g topically 4 (four) times daily as needed (pain).) 100 g 5 unk   exemestane (AROMASIN) 25 MG tablet Take 1 tablet (25 mg total) by mouth daily after breakfast. 90 tablet 3 06/30/2021   levothyroxine (SYNTHROID) 50 MCG tablet Take 1 tablet (50 mcg total) by mouth daily before breakfast. Overdue for Annual appt must see provider for future refills 90 tablet 3 06/30/2021   lipase/protease/amylase (CREON) 12000-38000 units CPEP capsule Take 1 capsule (12,000 Units total) by mouth daily as needed (stomach problems). 270 capsule 3 unk   lovastatin (MEVACOR) 20 MG tablet Take 1 tablet (20 mg total) by mouth daily. 90 tablet 3 06/30/2021   magnesium oxide (MAG-OX) 400 MG tablet Take 400 mg by mouth 2 (two) times daily.   06/30/2021   methocarbamol (ROBAXIN) 500 MG tablet Take 1 tablet (500 mg total) by mouth every 8 (eight) hours as needed for muscle spasms. 20 tablet 0 unk   omeprazole (PRILOSEC) 40 MG capsule Take 1 capsule (40 mg total) by mouth daily. Overdue for Annual appt must see provider for future refills (Patient taking differently: Take 40 mg by mouth daily as needed (acid reflux). Overdue for Annual appt must see provider for future refills) 90 capsule 3 Past Week   OVER THE COUNTER MEDICATION Apply 1 application topically daily as needed (apply to buttocks  as needed for irritaiton). Intensive Skin Care Therapy  unk   polyethylene glycol powder (GLYCOLAX/MIRALAX) powder mix 1 capful (17 grams) IN 8 ounces OF liquid EVERY DAY (Patient taking differently: Take 17 g by mouth daily as needed for mild constipation.) 1581 g 3 06/30/2021   potassium chloride (KLOR-CON) 8 MEQ tablet Take 1 tablet (8 mEq total) by mouth daily. 90 tablet 3 06/30/2021   torsemide (DEMADEX) 20 MG tablet Take 60 mg every morning and 40 mg in the pm (2 pm) 450 tablet 3 06/28/2021   traMADol (ULTRAM) 50 MG tablet Take 1 tablet (50 mg total) by mouth every 8 (eight) hours as  needed for severe pain. 30 tablet 0 06/30/2021   warfarin (COUMADIN) 5 MG tablet Take 1 tablet daily except take 1/2 tablet on Wed and Sat or Take as directed by anticoagulation clinic (Patient taking differently: Take 2.5-5 mg by mouth See admin instructions. 2.5mg  every day except for Tuesday's. Tuesday's take 5mg . Total weekly dose of 20mg .) 90 tablet 1 06/30/2021 at 2200   tamoxifen (NOLVADEX) 20 MG tablet Take 1 tablet (20 mg total) by mouth daily. (Patient taking differently: Take 20 mg by mouth daily. Will take after finishing Exemestane) 30 tablet 5    Scheduled:   carvedilol  6.25 mg Oral BID WC   cholecalciferol  2,000 Units Oral Daily   exemestane  25 mg Oral QPC breakfast   levothyroxine  50 mcg Oral QAC breakfast   magnesium oxide  400 mg Oral BID   pantoprazole  40 mg Oral Daily   [START ON 07/21/2021] potassium chloride  20 mEq Oral Daily   potassium chloride  40 mEq Oral Once   pravastatin  20 mg Oral q1800   torsemide  40 mg Oral BID   vitamin B-12  2,000 mcg Oral Daily   Warfarin - Pharmacist Dosing Inpatient   Does not apply q1600   Infusions:   sodium chloride 250 mL (07/16/21 1319)   PRN: sodium chloride, acetaminophen **OR** acetaminophen, diclofenac Sodium, fentaNYL, fentaNYL, lidocaine (PF), lipase/protease/amylase, midazolam, midazolam, ondansetron **OR** ondansetron (ZOFRAN) IV, oxyCODONE, polyethylene glycol, polyvinyl alcohol, traMADol, witch hazel-glycerin Anti-infectives (From admission, onward)    Start     Dose/Rate Route Frequency Ordered Stop   07/16/21 1200  vancomycin (VANCOREADY) IVPB 750 mg/150 mL  Status:  Discontinued        750 mg 150 mL/hr over 60 Minutes Intravenous Every 24 hours 07/15/21 1242 07/19/21 1537   07/15/21 1400  vancomycin (VANCOREADY) IVPB 2000 mg/400 mL        2,000 mg 200 mL/hr over 120 Minutes Intravenous  Once 07/15/21 1242 07/15/21 1522   07/10/21 1530  doxycycline (VIBRA-TABS) tablet 100 mg  Status:  Discontinued        100  mg Oral Every 12 hours 07/10/21 1437 07/15/21 1127   07/05/21 1245  cefadroxil (DURICEF) capsule 500 mg        500 mg Oral 2 times daily 07/05/21 1155 07/07/21 2217   07/03/21 0900  cefTRIAXone (ROCEPHIN) 2 g in sodium chloride 0.9 % 100 mL IVPB  Status:  Discontinued        2 g 200 mL/hr over 30 Minutes Intravenous Every 24 hours 07/02/21 0950 07/05/21 1155   07/02/21 0915  cefTRIAXone (ROCEPHIN) 2 g in sodium chloride 0.9 % 100 mL IVPB        2 g 200 mL/hr over 30 Minutes Intravenous  Once 07/02/21 0909 07/02/21 1033       Assessment: 75 YO F with a PMH of permanent afib (  on warfarin PTA), and recurrent falls reported to the ED with complaints of pain secondary to fall. Patient reports fall from bed on 06/28/2021 and associated pain in shoulder, ankle, and bilateral knees. Patient also reports poor oral intake afterwards. Head CT was negative for any acute abnormality.   - Vitamin K 2.5 PO x 1 administered on 07/02/2021 and 07/05/2021 for elevated INR - Warfarin resumed 07/06/2021  Today, 07/20/2021 - INR 4.9 (SUPRAtherapeutic): increasing despite Warfarin last dose on 07/16/2021 - CBC Stable: Hgb 9.7, Plts 215 - Poor oral intake per note - No signs or symptoms of bleeding noted - No significant drug-drug interactions   Goal of Therapy:  - INR 2-3 - Monitor platelets by anticoagulation protocol: Yes   Plan:  - Hold Warfarin dose for today - Monitor daily PT/INR, CBC - Monitor for signs and symptoms of bleeding   Darcel Smalling, Student Pharmacist

## 2021-07-20 NOTE — Progress Notes (Signed)
PROGRESS NOTE    Katelyn Jackson  ZWC:[id number] DOB: [birth year] DOA: 07/01/2021 PCP: Cassandria Anger, MD   Brief Narrative: 75 years old female with PMH significant for permanent A. fib on Coumadin, hypertension, hyperlipidemia, pacemaker in place, history of breast cancer, hypothyroidism, chronic bilateral lymphedema, recurrent falls, morbid obesity, BMI 51, GERD, pulmonary hypertension who needs full assistance for daily life activities, She has home health aide 6 times a week at home,  brought to the ED by her son for the evaluation of recent fall on last Monday.  Patient fell from bed on Monday since then complaining about left shoulder pain,  left ankle pain and bilateral knee pain and unable to walk or stand, go to the bathroom and progressively declining. She was found to have left lower extremity cellulitis.  Patient completed antibiotics.  Cellulitis has resolved.  Patient was given vitamin K due to supratherapeutic INR.  She was also given Lasix for CHF exacerbation.  She was managed with IV antibiotics for cellulitis,  IV Lasix for CHF.  ABG showed CO2 retention-chronic hypercapnia compensated.  Assessment & Plan:   Principal Problem:   Cellulitis of left lower extremity Active Problems:   Essential hypertension   Chronic atrial fibrillation (HCC)   Lymphedema   Anemia of chronic disease   CRF (chronic renal failure), stage 4 (severe) (HCC)   Morbid obesity (HCC)   GERD (gastroesophageal reflux disease)   Hypothyroidism   Supratherapeutic INR   Pulmonary edema   Pressure injury of skin  Cellulitis of left lower extremity/ Chronic bilateral lymphedema: Completed antibiotics.  Erythema and warmth has resolved.   Recurrent falls / Ambulatory dysfunction / moderate to severe osteoarthritis: Patient had complete skeletal survey.  X-ray of the knees and left ankle: No fracture Right hip: Degenerative disc pain.  Patient has chronic pain for years. orthopedics was  consulted.Despite the need for bilateral total knee replacements,  She is certainly not a surgical candidate from cardiac, renal and body habitus standpoint. PT and OT recommended  skilled nursing facility.   Acute hypoxic respiratory failure could be multifactorial:  > Resolved. secondary to fluid overload,  acute on chronic systolic CHF, hypercapnia likely from OSA. Respiratory status much improved.  Patient had adequate diuresis with negative fluid balance. Continue supplemental oxygen to keep saturation above 94%. Diuretics changed to torsemide po 40 mg twice daily. She is now 93% on room air.   AKI on CKD stage IIIb: Baseline serum creatinine 1.4-1.6 Creatinine improved with torsemide and  remained stable. Avoid nephrotoxic medications.   Essential hypertension: Blood pressure 121/62 decrease the dose of Coreg, she is also on torsemide 40 mg twice a day.   Chronic atrial fibrillation: Continue Coreg, heart rate controlled. Coumadin was on hold because of supratherapeutic INR.   INR today is 4.9, holding Coumadin pharmacy following levels. Will consult pharmacy for Lovenox as Ortho planning for possible surgical procedure the right shoulder for joint washout.   Pacemaker in place: Stable.  Hypothyroidism: TSH 2.7 on 07/02/2021 Continue levothyroxine.   Anemia of chronic disease: Hemoglobin remained stable, no obvious bleeding.   History of breast cancer: Outpatient follow-up.   Right shoulder pain and swelling: Venous duplex ruled out DVT. Right shoulder x-ray and CT scan showed  chronic anterior shoulder dislocation. Patient was on doxycycline which was changed to vancomycin due to persistent fever and pain.  Patient spiked fever again on vancomycin.  CT of the shoulder 07/19/2021-previously aspirated fluid collection anterior to the right shoulder has decreased in  size.  No new or enlarging fluid collection.  Moderate-sized joint effusion grossly unchanged in volume  suboptimally visualized due to artifact from the shoulder arthroplasty.  Interval mildly increased displacement of the fracture involving the base of the acromion.  No acute findings or specific evidence of osteomyelitis.  Chronic severe atrophy of the right shoulder rotator cuff musculature with narrowing of the subacromial space and anterior inferior subluxation of the humeral head.    Patient has a stitch abscess in the right shoulder she is status post incision and drainage by interventional radiology on 07/16/2021.  Cultures are negative so far. ID consulted.  Vancomycin was stopped 07/19/2021.  Plan to monitor her closely for few days of antibiotics. She has auth to be discharged to skilled nursing facility active until 07/24/2021.  Encephalopathy-resolved work-up including ABG B12 folate RPR and ammonia negative.  Constipation resolved. Pressure Injury 07/03/21 Thigh Posterior;Right Stage 3 -  Full thickness tissue loss. Subcutaneous fat may be visible but bone, tendon or muscle are NOT exposed. (Active)  07/03/21 0453  Location: Thigh  Location Orientation: Posterior;Right  Staging: Stage 3 -  Full thickness tissue loss. Subcutaneous fat may be visible but bone, tendon or muscle are NOT exposed.  Wound Description (Comments):   Present on Admission: Yes     Pressure Injury 07/03/21 Sacrum Stage 2 -  Partial thickness loss of dermis presenting as a shallow open injury with a red, pink wound bed without slough. (Active)  07/03/21 2105  Location: Sacrum  Location Orientation:   Staging: Stage 2 -  Partial thickness loss of dermis presenting as a shallow open injury with a red, pink wound bed without slough.  Wound Description (Comments):   Present on Admission: Yes    Estimated body mass index is 43.45 kg/m as calculated from the following:   Height as of this encounter: 5\' 1"  (1.549 m).   Weight as of this encounter: 104.3 kg.  DVT prophylaxis: High INR Coumadin  code Status: Full  code Family Communication: discussed with son on the phone  disposition Plan:  Status is: Inpatient  Remains inpatient appropriate because:Hemodynamically unstable  Dispo: The patient is from: Home              Anticipated d/c is to: SNF              Patient currently is not medically stable to d/c.   Difficult to place patient No   Consultants:  Ortho  Procedures: None Antimicrobials: Doxycycline  Subjective:  She feels better she thinks her shoulder is better her legs are better Objective: Vitals:   07/19/21 2045 07/20/21 0500 07/20/21 0558 07/20/21 0941  BP: (!) 101/51 115/69  118/66  Pulse: 62 80 71 66  Resp: 18 18    Temp: 98.4 F (36.9 C) 99.3 F (37.4 C)    TempSrc: Oral Oral    SpO2: (!) 89% (!) 87% 94%   Weight:      Height:        Intake/Output Summary (Last 24 hours) at 07/20/2021 1446 Last data filed at 07/20/2021 0557 Gross per 24 hour  Intake --  Output 200 ml  Net -200 ml    Filed Weights   07/17/21 0520 07/18/21 0500 07/19/21 0434  Weight: 110.5 kg 106 kg 104.3 kg    Examination:  General exam: Appears calm and comfortable  Respiratory system: Clear to auscultation. Respiratory effort normal. Cardiovascular system: S1 & S2 heard, RRR. No JVD, murmurs, rubs, gallops or clicks.. Gastrointestinal  system: Abdomen is nondistended, soft and nontender. No organomegaly or masses felt. Normal bowel sounds heard. Central nervous system: Alert and oriented. No focal neurological deficits. Extremities: Bilateral lower extremity and right upper extremity edema.  Persistent bullae in the distal aspect of the right shoulder still present. Skin: No rashes, lesions or ulcers Psychiatry: Difficult to assess due to language barrier   Data Reviewed: I have personally reviewed following labs and imaging studies  CBC: Recent Labs  Lab 07/14/21 0450 07/16/21 0431 07/20/21 0400  WBC 5.9 5.4 10.3  HGB 9.3* 9.2* 9.7*  HCT 29.4* 29.2* 30.9*  MCV 93.9 94.2  95.1  PLT 221 205 427    Basic Metabolic Panel: Recent Labs  Lab 07/14/21 0450 07/16/21 0431 07/17/21 0526 07/18/21 0524 07/19/21 0430  NA 136 133* 135 136 135  K 3.5 3.1* 3.1* 3.3* 3.0*  CL 94* 91* 90* 92* 91*  CO2 36* 33* 34* 35* 35*  GLUCOSE 105* 95 94 118* 119*  BUN 33* 30* 34* 33* 31*  CREATININE 1.51* 1.36* 1.46* 1.38* 1.35*  CALCIUM 10.2 9.8 10.1 10.2 9.9  MG  --  2.3  --   --   --     GFR: Estimated Creatinine Clearance: 40 mL/min (A) (by C-G formula based on SCr of 1.35 mg/dL (H)). Liver Function Tests: Recent Labs  Lab 07/16/21 0431  AST 26  ALT 21  ALKPHOS 373*  BILITOT 2.8*  PROT 6.1*  ALBUMIN 2.1*    No results for input(s): LIPASE, AMYLASE in the last 168 hours. Recent Labs  Lab 07/16/21 0431  AMMONIA 31    Coagulation Profile: Recent Labs  Lab 07/16/21 0431 07/17/21 0526 07/18/21 0524 07/19/21 0430 07/20/21 0400  INR 2.9* 3.8* 3.6* 4.0* 4.9*    Cardiac Enzymes: No results for input(s): CKTOTAL, CKMB, CKMBINDEX, TROPONINI in the last 168 hours. BNP (last 3 results) No results for input(s): PROBNP in the last 8760 hours. HbA1C: No results for input(s): HGBA1C in the last 72 hours. CBG: No results for input(s): GLUCAP in the last 168 hours.  Lipid Profile: No results for input(s): CHOL, HDL, LDLCALC, TRIG, CHOLHDL, LDLDIRECT in the last 72 hours. Thyroid Function Tests: No results for input(s): TSH, T4TOTAL, FREET4, T3FREE, THYROIDAB in the last 72 hours. Anemia Panel: No results for input(s): VITAMINB12, FOLATE, FERRITIN, TIBC, IRON, RETICCTPCT in the last 72 hours.  Sepsis Labs: No results for input(s): PROCALCITON, LATICACIDVEN in the last 168 hours.  Recent Results (from the past 240 hour(s))  Resp Panel by RT-PCR (Flu A&B, Covid) Nasopharyngeal Swab     Status: None   Collection Time: 07/13/21  6:05 PM   Specimen: Nasopharyngeal Swab; Nasopharyngeal(NP) swabs in vial transport medium  Result Value Ref Range Status   SARS  Coronavirus 2 by RT PCR NEGATIVE NEGATIVE Final    Comment: (NOTE) SARS-CoV-2 target nucleic acids are NOT DETECTED.  The SARS-CoV-2 RNA is generally detectable in upper respiratory specimens during the acute phase of infection. The lowest concentration of SARS-CoV-2 viral copies this assay can detect is 138 copies/mL. A negative result does not preclude SARS-Cov-2 infection and should not be used as the sole basis for treatment or other patient management decisions. A negative result may occur with  improper specimen collection/handling, submission of specimen other than nasopharyngeal swab, presence of viral mutation(s) within the areas targeted by this assay, and inadequate number of viral copies(<138 copies/mL). A negative result must be combined with clinical observations, patient history, and epidemiological information. The expected result  is Negative.  Fact Sheet for Patients:  EntrepreneurPulse.com.au  Fact Sheet for Healthcare Providers:  IncredibleEmployment.be  This test is no t yet approved or cleared by the [place] FDA and  has been authorized for detection and/or diagnosis of SARS-CoV-2 by FDA under an Emergency Use Authorization (EUA). This EUA will remain  in effect (meaning this test can be used) for the duration of the COVID-19 declaration under Section 564(b)(1) of the Act, 21 U.S.C.section 360bbb-3(b)(1), unless the authorization is terminated  or revoked sooner.       Influenza A by PCR NEGATIVE NEGATIVE Final   Influenza B by PCR NEGATIVE NEGATIVE Final    Comment: (NOTE) The Xpert Xpress SARS-CoV-2/FLU/RSV plus assay is intended as an aid in the diagnosis of influenza from Nasopharyngeal swab specimens and should not be used as a sole basis for treatment. Nasal washings and aspirates are unacceptable for Xpert Xpress SARS-CoV-2/FLU/RSV testing.  Fact Sheet for  Patients: EntrepreneurPulse.com.au  Fact Sheet for Healthcare Providers: IncredibleEmployment.be  This test is not yet approved or cleared by the [place] FDA and has been authorized for detection and/or diagnosis of SARS-CoV-2 by FDA under an Emergency Use Authorization (EUA). This EUA will remain in effect (meaning this test can be used) for the duration of the COVID-19 declaration under Section 564(b)(1) of the Act, 21 U.S.C. section 360bbb-3(b)(1), unless the authorization is terminated or revoked.  Performed at [facility], [place] [address], [place], [place] [address]   Aerobic/Anaerobic Culture w Gram Stain (surgical/deep wound)     Status: None (Preliminary result)   Collection Time: 07/16/21  4:20 PM   Specimen: Abscess  Result Value Ref Range Status   Specimen Description   Final    ABSCESS RIGHT ARM Performed at [facility] [address], [place], [place] [address]    Special Requests   Final    NONE Performed at [facility], [place] [address] [place], [place] [address]    Gram Stain   Final    ABUNDANT WBC PRESENT,BOTH PMN AND MONONUCLEAR NO ORGANISMS SEEN    Culture   Final    NO GROWTH 4 DAYS NO ANAEROBES ISOLATED; CULTURE IN PROGRESS FOR 5 DAYS Performed at [facility], [place] [address], [place], [place] [address]    Report Status PENDING  Incomplete  Aerobic/Anaerobic Culture w Gram Stain (surgical/deep wound)     Status: None (Preliminary result)   Collection Time: 07/16/21  4:25 PM   Specimen: Joint, Right Shoulder; Synovial Fluid  Result Value Ref Range Status   Specimen Description   Final    JOINT FLUID RIGHT SHOULDER Performed at [facility], [place] [address] [place], [place] [address]    Special Requests   Final    NONE Performed at [facility], [place] [address], [place], [place] [address]    Gram Stain    Final    FEW WBC PRESENT, PREDOMINANTLY PMN NO ORGANISMS SEEN    Culture   Final    NO GROWTH 4 DAYS NO ANAEROBES ISOLATED; CULTURE IN PROGRESS FOR 5 DAYS Performed at [place] [address], [place], [place] [address]    Report Status PENDING  Incomplete  Resp Panel by RT-PCR (Flu A&B, Covid) Nasopharyngeal Swab     Status: None   Collection Time: 07/17/21 11:13 AM   Specimen: Nasopharyngeal Swab; Nasopharyngeal(NP) swabs in vial transport medium  Result Value Ref Range Status  SARS Coronavirus 2 by RT PCR NEGATIVE NEGATIVE Final    Comment: (NOTE) SARS-CoV-2 target nucleic acids are NOT DETECTED.  The SARS-CoV-2 RNA is generally detectable in upper respiratory specimens during the acute phase of infection. The lowest concentration of SARS-CoV-2 viral copies this assay can detect is 138 copies/mL. A negative result does not preclude SARS-Cov-2 infection and should not be used as the sole basis for treatment or other patient management decisions. A negative result may occur with  improper specimen collection/handling, submission of specimen other than nasopharyngeal swab, presence of viral mutation(s) within the areas targeted by this assay, and inadequate number of viral copies(<138 copies/mL). A negative result must be combined with clinical observations, patient history, and epidemiological information. The expected result is Negative.  Fact Sheet for Patients:  EntrepreneurPulse.com.au  Fact Sheet for Healthcare Providers:  IncredibleEmployment.be  This test is no t yet approved or cleared by the [place] FDA and  has been authorized for detection and/or diagnosis of SARS-CoV-2 by FDA under an Emergency Use Authorization (EUA). This EUA will remain  in effect (meaning this test can be used) for the duration of the COVID-19 declaration under Section 564(b)(1) of the Act, 21 U.S.C.section 360bbb-3(b)(1), unless the  authorization is terminated  or revoked sooner.       Influenza A by PCR NEGATIVE NEGATIVE Final   Influenza B by PCR NEGATIVE NEGATIVE Final    Comment: (NOTE) The Xpert Xpress SARS-CoV-2/FLU/RSV plus assay is intended as an aid in the diagnosis of influenza from Nasopharyngeal swab specimens and should not be used as a sole basis for treatment. Nasal washings and aspirates are unacceptable for Xpert Xpress SARS-CoV-2/FLU/RSV testing.  Fact Sheet for Patients: EntrepreneurPulse.com.au  Fact Sheet for Healthcare Providers: IncredibleEmployment.be  This test is not yet approved or cleared by the [place] FDA and has been authorized for detection and/or diagnosis of SARS-CoV-2 by FDA under an Emergency Use Authorization (EUA). This EUA will remain in effect (meaning this test can be used) for the duration of the COVID-19 declaration under Section 564(b)(1) of the Act, 21 U.S.C. section 360bbb-3(b)(1), unless the authorization is terminated or revoked.  Performed at [facility], [place] [address], [place], [place] [address]           Radiology Studies: CT SHOULDER RIGHT W CONTRAST  Result Date: 07/19/2021 CLINICAL DATA:  Fall 1 week ago. Shoulder pain. Concern for septic arthritis. Right upper arm fluid collection aspiration 3 days ago. EXAM: CT OF THE UPPER RIGHT EXTREMITY WITH CONTRAST TECHNIQUE: Multidetector CT imaging of the right shoulder was performed according to the standard protocol following intravenous contrast administration. CONTRAST:  40mL OMNIPAQUE IOHEXOL 350 MG/ML SOLN COMPARISON:  Images during shoulder aspiration 07/16/2021. CT 07/08/2021. FINDINGS: Bones/Joint/Cartilage Status post right total shoulder arthroplasty without evidence of acute fracture, dislocation or hardware loosening. Unchanged anterior inferior subluxation of the humeral head relative to the glenoid with underlying chronic Lynn oil  bone loss. No evidence of acute bone destruction. Previously demonstrated fracture involving the base of the acromion is slightly more displaced (6 mm). Stable postsurgical changes of the distal clavicle and acromion. A moderate-sized right shoulder effusion is partly obscured by artifact from the orthopedic hardware, but is grossly unchanged. Ligaments Suboptimally assessed by CT. Muscles and Tendons Chronic atrophy of the supraspinatus, infraspinatus and subscapularis muscles again noted. Soft tissues The previously demonstrated fluid collection anterior to the right shoulder appears slightly smaller, measuring approximately 3.8 x 1.9 cm on image 44/7 (previously 7.7  x 2.7 cm). A small amount of gas is present in this collection, decreased from the previous study. No enlarging periarticular fluid collections are identified. Right lung interstitial prominence and a cardiac pacemaker are noted. IMPRESSION: 1. The previously aspirated fluid collection anterior to the right shoulder has decreased in size. No new or enlarging fluid collections identified. 2. The moderate-sized joint effusion is grossly unchanged in volume, suboptimally visualized due to artifact from the shoulder arthroplasty. 3. Interval mildly increased displacement of the fracture involving the base of the acromion. No acute osseous findings or specific evidence of osteomyelitis. 4. Chronic severe atrophy of the right shoulder rotator cuff musculature with narrowing of the subacromial space and anterior inferior subluxation of the humeral head. Electronically Signed   By: Richardean Sale M.D.   On: 07/19/2021 18:04   VAS [place] LOWER EXTREMITY VENOUS (DVT)  Result Date: 07/20/2021  Lower Venous DVT Study Patient Name:  NALAYA WOJDYLA  Date of Exam:   07/20/2021 Medical Rec #: [id number]          Accession #:    [id number] Date of Birth: 31-May-1946          Patient Gender: F Patient Age:   51 years Exam Location:  [facility] Procedure:       VAS [place] LOWER EXTREMITY VENOUS (DVT) Referring Phys: [name] --------------------------------------------------------------------------------  Indications: Pain.  Risk Factors: None identified. Limitations: Body habitus and poor ultrasound/tissue interface. Comparison Study: No prior studies. Performing Technologist: Oliver Hum RVT  Examination Guidelines: A complete evaluation includes B-mode imaging, spectral Doppler, color Doppler, and power Doppler as needed of all accessible portions of each vessel. Bilateral testing is considered an integral part of a complete examination. Limited examinations for reoccurring indications may be performed as noted. The reflux portion of the exam is performed with the patient in reverse Trendelenburg.  +---------+---------------+---------+-----------+----------+-------------------+ RIGHT    CompressibilityPhasicitySpontaneityPropertiesThrombus Aging      +---------+---------------+---------+-----------+----------+-------------------+ CFV      Full           Yes      Yes                                      +---------+---------------+---------+-----------+----------+-------------------+ SFJ      Full                                                             +---------+---------------+---------+-----------+----------+-------------------+ FV Prox  Full                                                             +---------+---------------+---------+-----------+----------+-------------------+ FV Mid   Full                                                             +---------+---------------+---------+-----------+----------+-------------------+ FV Distal  Yes      Yes                                      +---------+---------------+---------+-----------+----------+-------------------+ PFV      Full                                                              +---------+---------------+---------+-----------+----------+-------------------+ POP      Full           Yes      Yes                                      +---------+---------------+---------+-----------+----------+-------------------+ PTV      Full                                                             +---------+---------------+---------+-----------+----------+-------------------+ PERO                                                  Not well visualized +---------+---------------+---------+-----------+----------+-------------------+   +---------+---------------+---------+-----------+----------+--------------+ LEFT     CompressibilityPhasicitySpontaneityPropertiesThrombus Aging +---------+---------------+---------+-----------+----------+--------------+ CFV      Full           Yes      Yes                                 +---------+---------------+---------+-----------+----------+--------------+ SFJ      Full                                                        +---------+---------------+---------+-----------+----------+--------------+ FV Prox  Full                                                        +---------+---------------+---------+-----------+----------+--------------+ FV Mid   Full                                                        +---------+---------------+---------+-----------+----------+--------------+ FV Distal               Yes      Yes                                 +---------+---------------+---------+-----------+----------+--------------+  PFV      Full                                                        +---------+---------------+---------+-----------+----------+--------------+ POP      Full           Yes      Yes                                 +---------+---------------+---------+-----------+----------+--------------+ PTV      Full                                                         +---------+---------------+---------+-----------+----------+--------------+ PERO     Full                                                        +---------+---------------+---------+-----------+----------+--------------+    Summary: RIGHT: - There is no evidence of deep vein thrombosis in the lower extremity. However, portions of this examination were limited- see technologist comments above.  - No cystic structure found in the popliteal fossa.  LEFT: - There is no evidence of deep vein thrombosis in the lower extremity. However, portions of this examination were limited- see technologist comments above.  - No cystic structure found in the popliteal fossa.  *See table(s) above for measurements and observations.    Preliminary         Scheduled Meds:  carvedilol  6.25 mg Oral BID WC   cholecalciferol  2,000 Units Oral Daily   exemestane  25 mg Oral QPC breakfast   levothyroxine  50 mcg Oral QAC breakfast   magnesium oxide  400 mg Oral BID   pantoprazole  40 mg Oral Daily   [START ON 07/21/2021] potassium chloride  20 mEq Oral Daily   pravastatin  20 mg Oral q1800   torsemide  40 mg Oral BID   vitamin B-12  2,000 mcg Oral Daily   Warfarin - Pharmacist Dosing Inpatient   Does not apply q1600   Continuous Infusions:  sodium chloride 250 mL (07/16/21 1319)     LOS: 18 days    Time spent: 20 min    Georgette Shell, MD  07/20/2021, 2:46 PM

## 2021-07-20 NOTE — Progress Notes (Signed)
Bilateral lower extremity venous duplex has been completed. Preliminary results can be found in CV Proc through chart review.   07/20/21 10:01 AM Katelyn Jackson RVT

## 2021-07-20 NOTE — TOC Progression Note (Signed)
Transition of Care [facility]) - Progression Note    Patient Details  Name: Katelyn Jackson MRN: [id number] Date of Birth: [birth year]  Transition of Care [facility]) CM/SW Contact  Kennisha Qin, Juliann Pulse, RN Phone Number: 07/20/2021, 10:12 AM  Clinical Narrative: Per attending not medically stable. Auth for [facility] ends 07/24/21.      Expected Discharge Plan: Skilled Nursing Facility Barriers to Discharge: Continued Medical Work up  Expected Discharge Plan and Services Expected Discharge Plan: [place]                                               Social Determinants of Health (SDOH) Interventions    Readmission Risk Interventions Readmission Risk Prevention Plan 07/06/2021 03/19/2021  Transportation Screening Complete Complete  HRI or [place] - Complete  Social Work Consult for [place] Planning/Counseling - Complete  Palliative Care Screening - Not Applicable  Medication Review Press photographer) - Complete  HRI or Home Care Consult Complete -  SW Recovery Care/Counseling Consult Complete -  Palliative Care Screening Not Applicable -  Skilled Nursing Facility Complete -  Some recent data might be hidden

## 2021-07-20 NOTE — Consult Note (Addendum)
Date of Admission:  07/01/2021          Reason for Consult:  Fevers and shoulder pain   Referring Provider: Jacki Cones, MD   Assessment:  Likely septic RIGHT prosthetic shoulder with abscess overlying shoulder joint Displaced fracture of acroion Chronic lymphedema ? Cellulitis on admission of left lower extremity Atrial fibrillation on coumadin with supratherapeutic INR Hx of breast cancer GERD Pulmonary HTN Obesity  Plan:  DC antibiotics and observe off antibiotics  Monitor INR and hopefully will drop If more fevers of antibiotics check blood cultures I think she is going to need to go to OR for I and D and if possible resection arthroplasty Hopefully she does NOT seed her blood with infection given pacemaker in placce Screen for viral hepatis  and HIV ESR and CRP  Principal Problem:   Cellulitis of left lower extremity Active Problems:   Essential hypertension   Chronic atrial fibrillation (HCC)   Lymphedema   Anemia of chronic disease   CRF (chronic renal failure), stage 4 (severe) (HCC)   Morbid obesity (HCC)   GERD (gastroesophageal reflux disease)   Hypothyroidism   Supratherapeutic INR   Pulmonary edema   Pressure injury of skin   Scheduled Meds:  carvedilol  6.25 mg Oral BID WC   cholecalciferol  2,000 Units Oral Daily   exemestane  25 mg Oral QPC breakfast   levothyroxine  50 mcg Oral QAC breakfast   magnesium oxide  400 mg Oral BID   pantoprazole  40 mg Oral Daily   [START ON 07/21/2021] potassium chloride  20 mEq Oral Daily   pravastatin  20 mg Oral q1800   torsemide  40 mg Oral BID   vitamin B-12  2,000 mcg Oral Daily   Warfarin - Pharmacist Dosing Inpatient   Does not apply q1600   Continuous Infusions:  sodium chloride 250 mL (07/16/21 1319)   PRN Meds:.sodium chloride, acetaminophen **OR** acetaminophen, diclofenac Sodium, fentaNYL, fentaNYL, lidocaine (PF), lipase/protease/amylase, midazolam, midazolam, ondansetron **OR**  ondansetron (ZOFRAN) IV, oxyCODONE, polyethylene glycol, polyvinyl alcohol, simethicone, traMADol, witch hazel-glycerin  HPI: Katelyn Jackson is a 75 y.o. female [name] speaking from [place], but originally from [place] (though I may have remembered [place] incorrectly) with hx of atrial fibrillation on coumadin, HTN, hyperlipidemia, with Pacemaker, breast cancer, chronic bilateral lower extremity edema who sustained a right shoulder humeral and scapular facrture and had end stage degenerative changes and underwent Right hemiarthroplasty with synovectomy and I and D with biopsy  with Ophelia Charter, MD on Mar 17, 2021. According to the patient and son (who translated much of interview) patient had been doing well with improved ROM in right shoulder with PT. However then she apparently slipped and fell from her bed striking her shoulder along with her knee and lower leg. She was seen in the ER at [facility] on August 22nd and thought to be without fracture or any acute problem.   However after DC to home she developed progressive weakness, malaise, anorexia with nausea and vomiting. She continued to complain of right sided shoulder pain and knee and leg pain though admission H&P notes the opposite side ad issue. She had some erythema on her LLE (photos are in Epic) and she was started on rocephin for cellulitis. After 2 days of this she was changed to cefadroxil and took this through 07/07/2021. Apparently the cellulitis (though I am skeptical of this a bit being present) resolved.   In the interim  she had CT left and right knees, lumbar spine and pelvis all of which were unrevealing for infection.   On 07/09/2021 she had shoulder CT scan which showed 7.7 x 2.7 cm fluid colleciton with gas along the lateral proximal humerus which was thought to possibly connect to bursa and joint. Acromial fracure was unchagned.   On 07/10/2021 she was started on doxycyline.    Orthopedic  surgery were consulted re her shoulder pain but they felt it was chronic in nature and not acute (though as mentioned by son she had an acute worsening of pain and ROM vs what she had accomplished with PT prior to her fall) and certainly the fluid collection was new.    ON 07/12/2021 she was febrile to 101.2 (on tylenol)   She was then started on IV vancomycin and doxycyline DC, still with low grade fevers  Orthopedics recommended IR guided aspirate of fluid collection and joint for cell count and differential and cultures.  Cultures were collected but no cell count. Cultures failed to grow an organism on antibiotics.  Over the weekend she was febrile to 102 still on vancomycin.  Repeat CT shows persistent joint effusion though other fluid collection is smaller.   Son states that skin lesion that drained fluid above shoulder joint only developed while she was in the hospital  Her INR remains supratherapeutic  Her story to me is highly consistent with a prosthetic septic joint. This was my suspicion before seeing the patient and even more compelling to me now.  I have discussed case with Dr. Griffin Basil and we will plan on DC antibiotics and observing her and hoping her INR in the meantime comes down as it is prohibitively  high for surgery at present.   I think she will clearly need I and D and if possible resection of prostheisis  Cultures will be of higher yield longer she is off antibiotics though her pace maker and risk of it becoming seeded gives me some anxiety.  I spent 110 minutes with the patient including than 50% of the time in face to face counseling of the patient and son re nature of workup for fevers an dlikely septic PJI personally reviewing CT shoulders x 2 CT knees bilaterally, CT L spine reviewing cultures CBC BMP along with review of medical records in preparation for the visit and during the visit and in coordination of her care with Dr. Griffin Basil and Dr. Zigmund Daniel.         Review of Systems: Review of Systems  Constitutional:  Positive for chills, fever and malaise/fatigue. Negative for weight loss.  HENT:  Negative for congestion and sore throat.   Eyes:  Negative for blurred vision and photophobia.  Respiratory:  Negative for cough, shortness of breath and wheezing.   Cardiovascular:  Negative for chest pain, palpitations and leg swelling.  Gastrointestinal:  Positive for nausea and vomiting. Negative for abdominal pain, blood in stool, constipation, diarrhea, heartburn and melena.  Genitourinary:  Negative for dysuria, flank pain and hematuria.  Musculoskeletal:  Positive for falls, joint pain and myalgias. Negative for back pain.  Skin:  Negative for itching and rash.  Neurological:  Positive for weakness. Negative for dizziness, focal weakness, loss of consciousness and headaches.  Endo/Heme/Allergies:  Does not bruise/bleed easily.  Psychiatric/Behavioral:  Negative for depression and suicidal ideas. The patient does not have insomnia.    Past Medical History:  Diagnosis Date   Arthritis    Cancer [facility])    breast cancer  Chronic atrial fibrillation (HCC)    Chronic kidney disease    sees Dr Florene Glen   Cramps, muscle, general    Dyspnea on exertion    Dysrhythmia    a-fib,    GERD (gastroesophageal reflux disease)    Headache    Hyperlipidemia    Hypertension    Hypothyroidism    Lymphedema    Moderate to severe pulmonary hypertension (HCC)    Obesity    Personal history of radiation therapy    Pneumonia 12/2020   Presence of permanent cardiac pacemaker 01/21/2021   for bradycardia    Syncope 12/2020   needed a pacemaker    Social History   Tobacco Use   Smoking status: Never   Smokeless tobacco: Never  Vaping Use   Vaping Use: Never used  Substance Use Topics   Alcohol use: No    Alcohol/week: 0.0 standard drinks   Drug use: No    Family History  Problem Relation Age of Onset   CAD Mother 32   Stroke Mother  63       hemorr CVA   COPD Father 67   Cancer Neg Hx    Allergies  Allergen Reactions   Chlorhexidine Gluconate Hives   Penicillins Itching    Has patient had a PCN reaction causing immediate rash, facial/tongue/throat swelling, SOB or lightheadedness with hypotension: no Has patient had a PCN reaction causing severe rash involving mucus membranes or skin necrosis: No Has patient had a PCN reaction that required hospitalization: No Has patient had a PCN reaction occurring within the last 10 years: No If all of the above answers are "NO", then may proceed with Cephalosporin use.  Tolerated Cephalosporin Date: 03/17/21.     OBJECTIVE: Blood pressure 121/63, pulse 69, temperature 98.6 F (37 C), temperature source Oral, resp. rate 16, height 5' 1"  (1.549 m), weight 104.3 kg, SpO2 93 %.  Physical Exam Constitutional:      General: She is not in acute distress.    Appearance: Normal appearance. She is well-developed. She is obese. She is not ill-appearing or diaphoretic.  HENT:     Head: Normocephalic and atraumatic.     Right Ear: Hearing and external ear normal.     Left Ear: Hearing and external ear normal.     Nose: No nasal deformity or rhinorrhea.  Eyes:     General: No scleral icterus.    Conjunctiva/sclera: Conjunctivae normal.     Right eye: Right conjunctiva is not injected.     Left eye: Left conjunctiva is not injected.     Pupils: Pupils are equal, round, and reactive to light.  Neck:     Vascular: No JVD.  Cardiovascular:     Rate and Rhythm: Normal rate. Rhythm irregular.     Heart sounds: Normal heart sounds, S1 normal and S2 normal. No murmur heard.   No friction rub. No gallop.  Pulmonary:     Effort: No respiratory distress.     Breath sounds: No stridor. No wheezing or rales.  Abdominal:     General: Bowel sounds are normal. There is no distension.     Palpations: Abdomen is soft.     Tenderness: There is no abdominal tenderness.  Musculoskeletal:      Right shoulder: Effusion and tenderness present. Decreased range of motion.     Left shoulder: Normal.     Cervical back: Normal range of motion and neck supple.     Right hip: Normal.  Left hip: Normal.     Right knee: Normal.     Left knee: Normal.     Right lower leg: Edema present.     Left lower leg: Edema present.  Lymphadenopathy:     Head:     Right side of head: No submandibular, preauricular or posterior auricular adenopathy.     Left side of head: No submandibular, preauricular or posterior auricular adenopathy.     Cervical: No cervical adenopathy.     Right cervical: No superficial or deep cervical adenopathy.    Left cervical: No superficial or deep cervical adenopathy.  Skin:    General: Skin is warm.     Coloration: Skin is not pale.     Findings: No abrasion, bruising, ecchymosis, erythema, lesion or rash.     Nails: There is no clubbing.  Neurological:     Mental Status: She is alert and oriented to person, place, and time.     Sensory: No sensory deficit.     Coordination: Coordination normal.     Gait: Gait normal.  Psychiatric:        Attention and Perception: She is attentive.        Mood and Affect: Mood normal.        Speech: Speech normal.        Behavior: Behavior normal. Behavior is cooperative.        Thought Content: Thought content normal.        Judgment: Judgment normal.   Right shoulder 07/20/2021:      LE with lymphedema 07/20/2021:     Lab Results Lab Results  Component Value Date   WBC 10.3 07/20/2021   HGB 9.7 (L) 07/20/2021   HCT 30.9 (L) 07/20/2021   MCV 95.1 07/20/2021   PLT 215 07/20/2021    Lab Results  Component Value Date   CREATININE 1.35 (H) 07/19/2021   BUN 31 (H) 07/19/2021   NA 135 07/19/2021   K 3.0 (L) 07/19/2021   CL 91 (L) 07/19/2021   CO2 35 (H) 07/19/2021    Lab Results  Component Value Date   ALT 21 07/16/2021   AST 26 07/16/2021   ALKPHOS 373 (H) 07/16/2021   BILITOT 2.8 (H) 07/16/2021      Microbiology: Recent Results (from the past 240 hour(s))  Resp Panel by RT-PCR (Flu A&B, Covid) Nasopharyngeal Swab     Status: None   Collection Time: 07/13/21  6:05 PM   Specimen: Nasopharyngeal Swab; Nasopharyngeal(NP) swabs in vial transport medium  Result Value Ref Range Status   SARS Coronavirus 2 by RT PCR NEGATIVE NEGATIVE Final    Comment: (NOTE) SARS-CoV-2 target nucleic acids are NOT DETECTED.  The SARS-CoV-2 RNA is generally detectable in upper respiratory specimens during the acute phase of infection. The lowest concentration of SARS-CoV-2 viral copies this assay can detect is 138 copies/mL. A negative result does not preclude SARS-Cov-2 infection and should not be used as the sole basis for treatment or other patient management decisions. A negative result may occur with  improper specimen collection/handling, submission of specimen other than nasopharyngeal swab, presence of viral mutation(s) within the areas targeted by this assay, and inadequate number of viral copies(<138 copies/mL). A negative result must be combined with clinical observations, patient history, and epidemiological information. The expected result is Negative.  Fact Sheet for Patients:  EntrepreneurPulse.com.au  Fact Sheet for Healthcare Providers:  IncredibleEmployment.be  This test is no t yet approved or cleared by the [place] and  has been authorized for detection and/or diagnosis of SARS-CoV-2 by FDA under an Emergency Use Authorization (EUA). This EUA will remain  in effect (meaning this test can be used) for the duration of the COVID-19 declaration under Section 564(b)(1) of the Act, 21 U.S.C.section 360bbb-3(b)(1), unless the authorization is terminated  or revoked sooner.       Influenza A by PCR NEGATIVE NEGATIVE Final   Influenza B by PCR NEGATIVE NEGATIVE Final    Comment: (NOTE) The Xpert Xpress SARS-CoV-2/FLU/RSV plus assay is  intended as an aid in the diagnosis of influenza from Nasopharyngeal swab specimens and should not be used as a sole basis for treatment. Nasal washings and aspirates are unacceptable for Xpert Xpress SARS-CoV-2/FLU/RSV testing.  Fact Sheet for Patients: EntrepreneurPulse.com.au  Fact Sheet for Healthcare Providers: IncredibleEmployment.be  This test is not yet approved or cleared by the [place] FDA and has been authorized for detection and/or diagnosis of SARS-CoV-2 by FDA under an Emergency Use Authorization (EUA). This EUA will remain in effect (meaning this test can be used) for the duration of the COVID-19 declaration under Section 564(b)(1) of the Act, 21 U.S.C. section 360bbb-3(b)(1), unless the authorization is terminated or revoked.  Performed at [facility], [place] [address], [place], [place] [address]   Aerobic/Anaerobic Culture w Gram Stain (surgical/deep wound)     Status: None (Preliminary result)   Collection Time: 07/16/21  4:20 PM   Specimen: Abscess  Result Value Ref Range Status   Specimen Description   Final    ABSCESS RIGHT ARM Performed at [place] [address], [place], [place] [address]    Special Requests   Final    NONE Performed at [facility], [place] [address], [place], [place] [address]    Gram Stain   Final    ABUNDANT WBC PRESENT,BOTH PMN AND MONONUCLEAR NO ORGANISMS SEEN    Culture   Final    NO GROWTH 4 DAYS NO ANAEROBES ISOLATED; CULTURE IN PROGRESS FOR 5 DAYS Performed at [facility], [place] [address], [place], [place] [address]    Report Status PENDING  Incomplete  Aerobic/Anaerobic Culture w Gram Stain (surgical/deep wound)     Status: None (Preliminary result)   Collection Time: 07/16/21  4:25 PM   Specimen: Joint, Right Shoulder; Synovial Fluid  Result Value Ref Range Status   Specimen Description   Final    JOINT FLUID  RIGHT SHOULDER Performed at [facility], [place] [address] [place], [place] [address]    Special Requests   Final    NONE Performed at [facility], [place] [address], [place], [place] [address]    Gram Stain   Final    FEW WBC PRESENT, PREDOMINANTLY PMN NO ORGANISMS SEEN    Culture   Final    NO GROWTH 4 DAYS NO ANAEROBES ISOLATED; CULTURE IN PROGRESS FOR 5 DAYS Performed at [facility] [address] [place], [place] [address]    Report Status PENDING  Incomplete  Resp Panel by RT-PCR (Flu A&B, Covid) Nasopharyngeal Swab     Status: None   Collection Time: 07/17/21 11:13 AM   Specimen: Nasopharyngeal Swab; Nasopharyngeal(NP) swabs in vial transport medium  Result Value Ref Range Status   SARS Coronavirus 2 by RT PCR NEGATIVE NEGATIVE Final    Comment: (NOTE) SARS-CoV-2 target nucleic acids are NOT DETECTED.  The SARS-CoV-2 RNA is generally detectable in upper respiratory specimens during the  acute phase of infection. The lowest concentration of SARS-CoV-2 viral copies this assay can detect is 138 copies/mL. A negative result does not preclude SARS-Cov-2 infection and should not be used as the sole basis for treatment or other patient management decisions. A negative result may occur with  improper specimen collection/handling, submission of specimen other than nasopharyngeal swab, presence of viral mutation(s) within the areas targeted by this assay, and inadequate number of viral copies(<138 copies/mL). A negative result must be combined with clinical observations, patient history, and epidemiological information. The expected result is Negative.  Fact Sheet for Patients:  EntrepreneurPulse.com.au  Fact Sheet for Healthcare Providers:  IncredibleEmployment.be  This test is no t yet approved or cleared by the [place] FDA and  has been authorized for detection and/or diagnosis of  SARS-CoV-2 by FDA under an Emergency Use Authorization (EUA). This EUA will remain  in effect (meaning this test can be used) for the duration of the COVID-19 declaration under Section 564(b)(1) of the Act, 21 U.S.C.section 360bbb-3(b)(1), unless the authorization is terminated  or revoked sooner.       Influenza A by PCR NEGATIVE NEGATIVE Final   Influenza B by PCR NEGATIVE NEGATIVE Final    Comment: (NOTE) The Xpert Xpress SARS-CoV-2/FLU/RSV plus assay is intended as an aid in the diagnosis of influenza from Nasopharyngeal swab specimens and should not be used as a sole basis for treatment. Nasal washings and aspirates are unacceptable for Xpert Xpress SARS-CoV-2/FLU/RSV testing.  Fact Sheet for Patients: EntrepreneurPulse.com.au  Fact Sheet for Healthcare Providers: IncredibleEmployment.be  This test is not yet approved or cleared by the [place] FDA and has been authorized for detection and/or diagnosis of SARS-CoV-2 by FDA under an Emergency Use Authorization (EUA). This EUA will remain in effect (meaning this test can be used) for the duration of the COVID-19 declaration under Section 564(b)(1) of the Act, 21 U.S.C. section 360bbb-3(b)(1), unless the authorization is terminated or revoked.  Performed at [facility], [place] [address], [place],  [address]     Alcide Evener, [place] [facility] [place] [facility] [phone] pager  07/20/2021, 5:15 PM

## 2021-07-20 NOTE — Progress Notes (Signed)
Brief Pharmacy Note: Lovenox for Afib See full Pharmacy note 9/13 am re Coumadin  Plan ortho procedure - shoulder washout  Pharmacy currently dosing Warfarin for Afib, last dose 9/9 with elevated INR of 4.9, possible DDI with Doxycycline  Hold Coumadin, use Lovenox for bridging, begin when INR < 2.  Await date of ortho procedure Continue daily Protime/INR  Minda Ditto PharmD [facility] [phone] 07/20/2021, 3:58 PM

## 2021-07-21 ENCOUNTER — Inpatient Hospital Stay (HOSPITAL_COMMUNITY): Payer: Medicare HMO

## 2021-07-21 DIAGNOSIS — M79605 Pain in left leg: Secondary | ICD-10-CM

## 2021-07-21 DIAGNOSIS — R791 Abnormal coagulation profile: Secondary | ICD-10-CM | POA: Diagnosis not present

## 2021-07-21 DIAGNOSIS — N179 Acute kidney failure, unspecified: Secondary | ICD-10-CM | POA: Diagnosis not present

## 2021-07-21 DIAGNOSIS — Z950 Presence of cardiac pacemaker: Secondary | ICD-10-CM

## 2021-07-21 DIAGNOSIS — M79672 Pain in left foot: Secondary | ICD-10-CM

## 2021-07-21 DIAGNOSIS — Z7189 Other specified counseling: Secondary | ICD-10-CM

## 2021-07-21 DIAGNOSIS — Z515 Encounter for palliative care: Secondary | ICD-10-CM

## 2021-07-21 DIAGNOSIS — M79604 Pain in right leg: Secondary | ICD-10-CM | POA: Diagnosis not present

## 2021-07-21 DIAGNOSIS — L02413 Cutaneous abscess of right upper limb: Secondary | ICD-10-CM

## 2021-07-21 DIAGNOSIS — G9341 Metabolic encephalopathy: Secondary | ICD-10-CM

## 2021-07-21 DIAGNOSIS — M79671 Pain in right foot: Secondary | ICD-10-CM

## 2021-07-21 DIAGNOSIS — N1832 Chronic kidney disease, stage 3b: Secondary | ICD-10-CM

## 2021-07-21 LAB — AEROBIC/ANAEROBIC CULTURE W GRAM STAIN (SURGICAL/DEEP WOUND)
Culture: NO GROWTH
Culture: NO GROWTH

## 2021-07-21 LAB — BASIC METABOLIC PANEL
Anion gap: 11 (ref 5–15)
BUN: 38 mg/dL — ABNORMAL HIGH (ref 8–23)
CO2: 31 mmol/L (ref 22–32)
Calcium: 10 mg/dL (ref 8.9–10.3)
Chloride: 90 mmol/L — ABNORMAL LOW (ref 98–111)
Creatinine, Ser: 1.78 mg/dL — ABNORMAL HIGH (ref 0.44–1.00)
GFR, Estimated: 29 mL/min — ABNORMAL LOW (ref >60)
Glucose, Bld: 94 mg/dL (ref 70–99)
Potassium: 3.5 mmol/L (ref 3.5–5.1)
Sodium: 132 mmol/L — ABNORMAL LOW (ref 135–145)

## 2021-07-21 LAB — CBC
HCT: 32.7 % — ABNORMAL LOW (ref 36.0–46.0)
Hemoglobin: 10.1 g/dL — ABNORMAL LOW (ref 12.0–15.0)
MCH: 29.7 pg (ref 26.0–34.0)
MCHC: 30.9 g/dL (ref 30.0–36.0)
MCV: 96.2 fL (ref 80.0–100.0)
Platelets: 236 10*3/uL (ref 150–400)
RBC: 3.4 MIL/uL — ABNORMAL LOW (ref 3.87–5.11)
RDW: 16.3 % — ABNORMAL HIGH (ref 11.5–15.5)
WBC: 8.5 10*3/uL (ref 4.0–10.5)
nRBC: 0 % (ref 0.0–0.2)

## 2021-07-21 LAB — HEPATIC FUNCTION PANEL
ALT: 17 U/L (ref 0–44)
AST: 21 U/L (ref 15–41)
Albumin: 2.5 g/dL — ABNORMAL LOW (ref 3.5–5.0)
Alkaline Phosphatase: 326 U/L — ABNORMAL HIGH (ref 38–126)
Bilirubin, Direct: 1 mg/dL — ABNORMAL HIGH (ref 0.0–0.2)
Indirect Bilirubin: 1.2 mg/dL — ABNORMAL HIGH (ref 0.3–0.9)
Total Bilirubin: 2.2 mg/dL — ABNORMAL HIGH (ref 0.3–1.2)
Total Protein: 6.5 g/dL (ref 6.5–8.1)

## 2021-07-21 LAB — SEDIMENTATION RATE: Sed Rate: 75 mm/hr — ABNORMAL HIGH (ref 0–22)

## 2021-07-21 LAB — PROTIME-INR
INR: 5.1 (ref 0.8–1.2)
Prothrombin Time: 47.3 seconds — ABNORMAL HIGH (ref 11.4–15.2)

## 2021-07-21 LAB — GAMMA GT: GGT: 184 U/L — ABNORMAL HIGH (ref 7–50)

## 2021-07-21 LAB — C-REACTIVE PROTEIN: CRP: 11.4 mg/dL — ABNORMAL HIGH (ref <1.0)

## 2021-07-21 MED ORDER — PHYTONADIONE 5 MG PO TABS
5.0000 mg | ORAL_TABLET | Freq: Once | ORAL | Status: AC
  Administered 2021-07-21: 5 mg via ORAL
  Filled 2021-07-21: qty 1

## 2021-07-21 NOTE — Assessment & Plan Note (Signed)
-   outpatient follow up as indicated

## 2021-07-21 NOTE — Consult Note (Signed)
Consultation Note Date: 07/21/2021   Patient Name: Katelyn Jackson  DOB: 06-15-1946  MRN: [id number]  Age / Sex: 75 y.o., female  PCP: Plotnikov, [name], MD Referring Physician: Dwyane Dee, MD  Reason for Consultation: Establishing goals of care  HPI/Patient Profile: 75 y.o. female admitted on 07/01/2021    Clinical Assessment and Goals of Care: 75 year old lady who lives at home with her son and daughter-in-law in [place], [place].  She is originally from [place] and patient has been living with her son in the [place] for a couple decades now.  She has atrial fibrillation for which she was on Coumadin, additionally she has chronic lymphedema and history of breast cancer hypothyroidism morbid obesity reflux disease pulmonary hypertension and recurrent falls.  Patient has a history of right shoulder hemiarthroplasty that was done in May 2022.  She is admitted to hospital medicine service for possible infection in her right shoulder.  Infectious diseases following.  Hospital course also complicated by supratherapeutic INR, chronic atrial fibrillation and acute renal failure on top of at least stage IIIb chronic kidney disease.  She also has a history of pacemaker placement.  She remains admitted to hospital medicine service.  Infectious disease specialist as well as orthopedics are following.  A palliative medicine consultation has also been requested for CODE STATUS and goals of care discussions. Patient is awake alert resting in bed.  Her son [name] is present at the bedside.  Patient wants her son to translate.  I introduced myself and palliative care as follows: Palliative medicine is specialized medical care for people living with serious illness. It focuses on providing relief from the symptoms and stress of a serious illness. The goal is to improve quality of life for both the patient and  the family. Goals of care: Broad aims of medical therapy in relation to the patient's values and preferences. Our aim is to provide medical care aimed at enabling patients to achieve the goals that matter most to them, given the circumstances of their particular medical situation and their constraints.   Brief life review performed.  Discussed with patient with her son translating which was the patient's preference.  Patient's baseline is such that she lives at home with her son and daughter-in-law.  She has home-based nursing and physical therapy that was coming out to her home.  She has to utilize assistance of walker and wheelchair.  She is not very ambulatory.  We reviewed about scope of current hospitalization.  We discussed about several chronic underlying conditions as well as acute issues pertaining to this hospitalization.  In light of this, goals wishes and values important to the patient and family as a unit attempted to be explored.  CODE STATUS conversations also undertaken.  We reviewed about what a full code entails and had a discussion about what happens inside the full efforts of a cardiopulmonary resuscitation.  Patient and son do not think that she would want to be kept alive by artificial mechanical means but they wish to  discuss amongst themselves further prior to letting us know any decision.  She remains full code for now.  NEXT OF KIN Son  SUMMARY OF RECOMMENDATIONS   Full code/full scope care Continue to monitor hospital course and overall disease trajectory of illness so as to guide further goals of care discussions. Thank you for the consult. Code Status/Advance Care Planning: Full code   Symptom Management:     Palliative Prophylaxis:  Delirium Protocol  Additional Recommendations (Limitations, Scope, Preferences): Full Scope Treatment  Psycho-social/Spiritual:  Desire for further Chaplaincy support:yes Additional Recommendations: Caregiving   Support/Resources  Prognosis:  Unable to determine  Discharge Planning: To Be Determined      Primary Diagnoses: Present on Admission:  Essential hypertension  Chronic atrial fibrillation (HCC)  Anemia of chronic disease  Acute renal failure superimposed on stage 3b chronic kidney disease (HCC)  Morbid obesity (HCC)  GERD (gastroesophageal reflux disease)  Hypothyroidism  (Resolved) Cellulitis of left lower extremity   I have reviewed the medical record, interviewed the patient and family, and examined the patient. The following aspects are pertinent.  Past Medical History:  Diagnosis Date   Arthritis    Cancer [facility])    breast cancer   Chronic atrial fibrillation (HCC)    Chronic kidney disease    sees Dr Florene Glen   Cramps, muscle, general    Dyspnea on exertion    Dysrhythmia    a-fib,    GERD (gastroesophageal reflux disease)    Headache    Hyperlipidemia    Hypertension    Hypothyroidism    Lymphedema    Moderate to severe pulmonary hypertension (HCC)    Obesity    Personal history of radiation therapy    Pneumonia 12/2020   Presence of permanent cardiac pacemaker 01/21/2021   for bradycardia    Syncope 12/2020   needed a pacemaker   Social History   Socioeconomic History   Marital status: Widowed    Spouse name: Not on file   Number of children: 1   Years of education: Not on file   Highest education level: Not on file  Occupational History   Not on file  Tobacco Use   Smoking status: Never   Smokeless tobacco: Never  Vaping Use   Vaping Use: Never used  Substance and Sexual Activity   Alcohol use: No    Alcohol/week: 0.0 standard drinks   Drug use: No   Sexual activity: Not on file  Other Topics Concern   Not on file  Social History Narrative   Not on file   Social Determinants of Health   Financial Resource Strain: Low Risk    Difficulty of Paying Living Expenses: Not hard at all  Food Insecurity: No Food Insecurity   Worried About  Charity fundraiser in the Last Year: Never true   Arboriculturist in the Last Year: Never true  Transportation Needs: Public librarian (Medical): Yes   Lack of Transportation (Non-Medical): Yes  Physical Activity: Inactive   Days of Exercise per Week: 0 days   Minutes of Exercise per Session: 0 min  Stress: No Stress Concern Present   Feeling of Stress : Not at all  Social Connections: Socially Isolated   Frequency of Communication with Friends and Family: More than three times a week   Frequency of Social Gatherings with Friends and Family: Once a week   Attends Religious Services: Never   Marine scientist or  Organizations: No   Attends Archivist Meetings: Never   Marital Status: Widowed   Family History  Problem Relation Age of Onset   CAD Mother 30   Stroke Mother 49       hemorr CVA   COPD Father 17   Cancer Neg Hx    Scheduled Meds:  carvedilol  6.25 mg Oral BID WC   cholecalciferol  2,000 Units Oral Daily   exemestane  25 mg Oral QPC breakfast   levothyroxine  50 mcg Oral QAC breakfast   magnesium oxide  400 mg Oral BID   pantoprazole  40 mg Oral Daily   phytonadione  5 mg Oral Once   potassium chloride  20 mEq Oral Daily   pravastatin  20 mg Oral q1800   torsemide  40 mg Oral BID   vitamin B-12  2,000 mcg Oral Daily   Continuous Infusions:  sodium chloride 250 mL (07/16/21 1319)   PRN Meds:.sodium chloride, acetaminophen **OR** acetaminophen, diclofenac Sodium, lipase/protease/amylase, ondansetron **OR** ondansetron (ZOFRAN) IV, oxyCODONE, polyethylene glycol, polyvinyl alcohol, simethicone, traMADol, witch hazel-glycerin Medications Prior to Admission:  Prior to Admission medications   Medication Sig Start Date End Date Taking? Authorizing Provider  allopurinol (ZYLOPRIM) 100 MG tablet Take 0.5 tablets (50 mg total) by mouth daily. Patient taking differently: Take 100 mg by mouth daily. 10/08/20  Yes Plotnikov,  [name], MD  carvedilol (COREG) 25 MG tablet Take 1 tablet (25 mg total) by mouth 2 (two) times daily. 05/11/21 05/11/22 Yes Plotnikov, [name], MD  Cholecalciferol (VITAMIN D3) 2000 units capsule Take 1 capsule (2,000 Units total) by mouth daily. 07/11/17  Yes Plotnikov, [name], MD  Cyanocobalamin (VITAMIN B-12 PO) Take 2,000 mcg by mouth daily.   Yes [provider]  diclofenac Sodium (VOLTAREN) 1 % GEL APPLY FOUR GRAMS FOUR TIMES DAILY AS NEEDED Patient taking differently: Apply 4 g topically 4 (four) times daily as needed (pain). 06/11/21  Yes Plotnikov, [name], MD  exemestane (AROMASIN) 25 MG tablet Take 1 tablet (25 mg total) by mouth daily after breakfast. 10/08/20  Yes Plotnikov, [name], MD  levothyroxine (SYNTHROID) 50 MCG tablet Take 1 tablet (50 mcg total) by mouth daily before breakfast. Overdue for Annual appt must see provider for future refills 02/09/21  Yes Plotnikov, [name], MD  lipase/protease/amylase (CREON) 12000-38000 units CPEP capsule Take 1 capsule (12,000 Units total) by mouth daily as needed (stomach problems). 10/08/20  Yes Plotnikov, [name], MD  lovastatin (MEVACOR) 20 MG tablet Take 1 tablet (20 mg total) by mouth daily. 10/08/20  Yes Plotnikov, [name], MD  magnesium oxide (MAG-OX) 400 MG tablet Take 400 mg by mouth 2 (two) times daily.   Yes [provider]  methocarbamol (ROBAXIN) 500 MG tablet Take 1 tablet (500 mg total) by mouth every 8 (eight) hours as needed for muscle spasms. 03/19/21  Yes McBane, Maylene Roes, PA-C  omeprazole (PRILOSEC) 40 MG capsule Take 1 capsule (40 mg total) by mouth daily. Overdue for Annual appt must see provider for future refills Patient taking differently: Take 40 mg by mouth daily as needed (acid reflux). Overdue for Annual appt must see provider for future refills 10/08/20  Yes Plotnikov, [name], MD  OVER THE COUNTER MEDICATION Apply 1 application topically daily as needed (apply to buttocks  as needed for  irritaiton). Intensive Skin Care Therapy   Yes [provider]  polyethylene glycol powder (GLYCOLAX/MIRALAX) powder mix 1 capful (17 grams) IN 8 ounces OF  liquid EVERY DAY Patient taking differently: Take 17 g by mouth daily as needed for mild constipation. 11/05/18  Yes Plotnikov, [name], MD  potassium chloride (KLOR-CON) 8 MEQ tablet Take 1 tablet (8 mEq total) by mouth daily. 10/08/20  Yes Plotnikov, [name], MD  torsemide (DEMADEX) 20 MG tablet Take 60 mg every morning and 40 mg in the pm (2 pm) 05/11/21  Yes Plotnikov, [name], MD  traMADol (ULTRAM) 50 MG tablet Take 1 tablet (50 mg total) by mouth every 8 (eight) hours as needed for severe pain. 03/19/21  Yes McBane, Maylene Roes, PA-C  warfarin (COUMADIN) 5 MG tablet Take 1 tablet daily except take 1/2 tablet on Wed and Sat or Take as directed by anticoagulation clinic Patient taking differently: Take 2.5-5 mg by mouth See admin instructions. 2.5mg  every day except for Tuesday's. Tuesday's take 5mg . Total weekly dose of 20mg . 10/08/20  Yes Plotnikov, [name], MD  tamoxifen (NOLVADEX) 20 MG tablet Take 1 tablet (20 mg total) by mouth daily. Patient taking differently: Take 20 mg by mouth daily. Will take after finishing Exemestane 06/10/21   Truitt Merle, MD   Allergies  Allergen Reactions   Chlorhexidine Gluconate Hives   Penicillins Itching    Has patient had a PCN reaction causing immediate rash, facial/tongue/throat swelling, SOB or lightheadedness with hypotension: no Has patient had a PCN reaction causing severe rash involving mucus membranes or skin necrosis: No Has patient had a PCN reaction that required hospitalization: No Has patient had a PCN reaction occurring within the last 10 years: No If all of the above answers are "NO", then may proceed with Cephalosporin use.  Tolerated Cephalosporin Date: 03/17/21.    Review of Systems Denies pain.   Physical Exam Elderly appearing female resting in the bed, appears  chronically ill Has bilateral lower extremity edema Has a skin lesion over her right shoulder which was visualized Has tenderness over right shoulder as well as edema Appears to have somewhat of a flat affect Does not have abdominal tenderness  Vital Signs: BP (!) 146/89 (BP Location: Left Arm)   Pulse (!) 48   Temp 98.7 F (37.1 C)   Resp 16   Ht 5\' 1"  (1.549 m)   Wt 104.1 kg   LMP  (LMP Unknown)   SpO2 95%   BMI 43.36 kg/m  Pain Scale: 0-10 POSS *See Group Information*: S-Acceptable,Sleep, easy to arouse Pain Score: Asleep   SpO2: SpO2: 95 % O2 Device:SpO2: 95 % O2 Flow Rate: .O2 Flow Rate (L/min): 2 L/min  IO: Intake/output summary:  Intake/Output Summary (Last 24 hours) at 07/21/2021 1536 Last data filed at 07/21/2021 1214 Gross per 24 hour  Intake 60 ml  Output 200 ml  Net -140 ml    LBM: Last BM Date: 07/21/21 Baseline Weight: Weight: 113.4 kg Most recent weight: Weight: 104.1 kg     Palliative Assessment/Data:   PPS 40%  Time In:  1400 Time Out:  1500 Time Total:  60  Greater than 50%  of this time was spent counseling and coordinating care related to the above assessment and plan.  Signed by: Loistine Chance, MD   Please contact Palliative Medicine Team phone at [phone] for questions and concerns.  For individual provider: See Shea Evans

## 2021-07-21 NOTE — Progress Notes (Signed)
Subjective: She has less pain in her lower extremities today.     Antibiotics:  Anti-infectives (From admission, onward)    Start     Dose/Rate Route Frequency Ordered Stop   07/16/21 1200  vancomycin (VANCOREADY) IVPB 750 mg/150 mL  Status:  Discontinued        750 mg 150 mL/hr over 60 Minutes Intravenous Every 24 hours 07/15/21 1242 07/19/21 1537   07/15/21 1400  vancomycin (VANCOREADY) IVPB 2000 mg/400 mL        2,000 mg 200 mL/hr over 120 Minutes Intravenous  Once 07/15/21 1242 07/15/21 1522   07/10/21 1530  doxycycline (VIBRA-TABS) tablet 100 mg  Status:  Discontinued        100 mg Oral Every 12 hours 07/10/21 1437 07/15/21 1127   07/05/21 1245  cefadroxil (DURICEF) capsule 500 mg        500 mg Oral 2 times daily 07/05/21 1155 07/07/21 2217   07/03/21 0900  cefTRIAXone (ROCEPHIN) 2 g in sodium chloride 0.9 % 100 mL IVPB  Status:  Discontinued        2 g 200 mL/hr over 30 Minutes Intravenous Every 24 hours 07/02/21 0950 07/05/21 1155   07/02/21 0915  cefTRIAXone (ROCEPHIN) 2 g in sodium chloride 0.9 % 100 mL IVPB        2 g 200 mL/hr over 30 Minutes Intravenous  Once 07/02/21 0909 07/02/21 1033       Medications: Scheduled Meds:  carvedilol  6.25 mg Oral BID WC   cholecalciferol  2,000 Units Oral Daily   exemestane  25 mg Oral QPC breakfast   levothyroxine  50 mcg Oral QAC breakfast   magnesium oxide  400 mg Oral BID   pantoprazole  40 mg Oral Daily   phytonadione  5 mg Oral Once   potassium chloride  20 mEq Oral Daily   pravastatin  20 mg Oral q1800   torsemide  40 mg Oral BID   vitamin B-12  2,000 mcg Oral Daily   Continuous Infusions:  sodium chloride 250 mL (07/16/21 1319)   PRN Meds:.sodium chloride, acetaminophen **OR** acetaminophen, diclofenac Sodium, lipase/protease/amylase, ondansetron **OR** ondansetron (ZOFRAN) IV, oxyCODONE, polyethylene glycol, polyvinyl alcohol, simethicone, traMADol, witch hazel-glycerin    Objective: Weight  change:   Intake/Output Summary (Last 24 hours) at 07/21/2021 1437 Last data filed at 07/21/2021 1214 Gross per 24 hour  Intake 60 ml  Output 200 ml  Net -140 ml   Blood pressure (!) 146/89, pulse (!) 48, temperature 98.7 F (37.1 C), resp. rate 16, height 5\' 1"  (1.549 m), weight 104.1 kg, SpO2 95 %. Temp:  [98.1 F (36.7 C)-98.7 F (37.1 C)] 98.7 F (37.1 C) (09/14 1237) Pulse Rate:  [48-79] 48 (09/14 1237) Resp:  [16-18] 16 (09/14 1237) BP: (100-146)/(50-89) 146/89 (09/14 1237) SpO2:  [90 %-95 %] 95 % (09/14 1237) Weight:  [104.1 kg] 104.1 kg (09/14 0335)  Physical Exam: Physical Exam Constitutional:      General: She is not in acute distress.    Appearance: She is well-developed. She is obese. She is not diaphoretic.  HENT:     Head: Normocephalic and atraumatic.     Right Ear: External ear normal.     Left Ear: External ear normal.     Mouth/Throat:     Pharynx: No oropharyngeal exudate.  Eyes:     General: No scleral icterus.    Conjunctiva/sclera: Conjunctivae normal.     Pupils: Pupils are equal, round,  and reactive to light.  Cardiovascular:     Rate and Rhythm: Normal rate and regular rhythm.     Heart sounds: Normal heart sounds. No murmur heard.   No friction rub. No gallop.  Pulmonary:     Effort: Pulmonary effort is normal. No respiratory distress.     Breath sounds: Normal breath sounds. No wheezing or rales.  Abdominal:     General: Bowel sounds are normal. There is no distension.     Palpations: Abdomen is soft.     Tenderness: There is no abdominal tenderness. There is no rebound.  Musculoskeletal:     Right shoulder: Tenderness present. Decreased range of motion.     Right lower leg: Edema present.     Left lower leg: Edema present.  Lymphadenopathy:     Cervical: No cervical adenopathy.  Skin:    General: Skin is warm and dry.     Coloration: Skin is pale.     Findings: No erythema or rash.  Neurological:     General: No focal deficit  present.     Mental Status: She is alert and oriented to person, place, and time.     Motor: No abnormal muscle tone.     Coordination: Coordination normal.  Psychiatric:        Mood and Affect: Mood normal.        Behavior: Behavior normal.        Thought Content: Thought content normal.        Judgment: Judgment normal.     Skin lesion on right shoulder:      CBC:    BMET Recent Labs    07/19/21 0430 07/21/21 0413  NA 135 132*  K 3.0* 3.5  CL 91* 90*  CO2 35* 31  GLUCOSE 119* 94  BUN 31* 38*  CREATININE 1.35* 1.78*  CALCIUM 9.9 10.0     Liver Panel  Recent Labs    07/21/21 0413  PROT 6.5  ALBUMIN 2.5*  AST 21  ALT 17  ALKPHOS 326*  BILITOT 2.2*  BILIDIR 1.0*  IBILI 1.2*       Sedimentation Rate Recent Labs    07/21/21 0413  ESRSEDRATE 75*   C-Reactive Protein Recent Labs    07/21/21 0413  CRP 11.4*    Micro Results: Recent Results (from the past 720 hour(s))  Urine Culture     Status: Abnormal   Collection Time: 07/01/21  2:39 PM   Specimen: Urine, Clean Catch  Result Value Ref Range Status   Specimen Description   Final    URINE, CLEAN CATCH Performed at [facility], [place] [address], [place], [place] [address]    Special Requests   Final    NONE Performed at [facility], [place] [address], [place], [place] [address]    Culture MULTIPLE SPECIES PRESENT, SUGGEST RECOLLECTION (A)  Final   Report Status 07/02/2021 FINAL  Final  SARS CORONAVIRUS 2 (TAT 6-24 HRS) Nasopharyngeal Nasopharyngeal Swab     Status: None   Collection Time: 07/02/21 11:19 AM   Specimen: Nasopharyngeal Swab  Result Value Ref Range Status   SARS Coronavirus 2 NEGATIVE NEGATIVE Final    Comment: (NOTE) SARS-CoV-2 target nucleic acids are NOT DETECTED.  The SARS-CoV-2 RNA is generally detectable in upper and lower respiratory specimens during the acute phase of infection. Negative results do not preclude SARS-CoV-2  infection, do not rule out co-infections with other pathogens, and should not be used as the sole  basis for treatment or other patient management decisions. Negative results must be combined with clinical observations, patient history, and epidemiological information. The expected result is Negative.  Fact Sheet for Patients: SugarRoll.be  Fact Sheet for Healthcare Providers: https://www.woods-mathews.com/  This test is not yet approved or cleared by the [place] FDA and  has been authorized for detection and/or diagnosis of SARS-CoV-2 by FDA under an Emergency Use Authorization (EUA). This EUA will remain  in effect (meaning this test can be used) for the duration of the COVID-19 declaration under Se ction 564(b)(1) of the Act, 21 U.S.C. section 360bbb-3(b)(1), unless the authorization is terminated or revoked sooner.  Performed at [facility], [place] [address] [place], [place] [address]   Resp Panel by RT-PCR (Flu A&B, Covid) Nasopharyngeal Swab     Status: None   Collection Time: 07/13/21  6:05 PM   Specimen: Nasopharyngeal Swab; Nasopharyngeal(NP) swabs in vial transport medium  Result Value Ref Range Status   SARS Coronavirus 2 by RT PCR NEGATIVE NEGATIVE Final    Comment: (NOTE) SARS-CoV-2 target nucleic acids are NOT DETECTED.  The SARS-CoV-2 RNA is generally detectable in upper respiratory specimens during the acute phase of infection. The lowest concentration of SARS-CoV-2 viral copies this assay can detect is 138 copies/mL. A negative result does not preclude SARS-Cov-2 infection and should not be used as the sole basis for treatment or other patient management decisions. A negative result may occur with  improper specimen collection/handling, submission of specimen other than nasopharyngeal swab, presence of viral mutation(s) within the areas targeted by this assay, and inadequate number of viral copies(<138  copies/mL). A negative result must be combined with clinical observations, patient history, and epidemiological information. The expected result is Negative.  Fact Sheet for Patients:  EntrepreneurPulse.com.au  Fact Sheet for Healthcare Providers:  IncredibleEmployment.be  This test is no t yet approved or cleared by the [place] FDA and  has been authorized for detection and/or diagnosis of SARS-CoV-2 by FDA under an Emergency Use Authorization (EUA). This EUA will remain  in effect (meaning this test can be used) for the duration of the COVID-19 declaration under Section 564(b)(1) of the Act, 21 U.S.C.section 360bbb-3(b)(1), unless the authorization is terminated  or revoked sooner.       Influenza A by PCR NEGATIVE NEGATIVE Final   Influenza B by PCR NEGATIVE NEGATIVE Final    Comment: (NOTE) The Xpert Xpress SARS-CoV-2/FLU/RSV plus assay is intended as an aid in the diagnosis of influenza from Nasopharyngeal swab specimens and should not be used as a sole basis for treatment. Nasal washings and aspirates are unacceptable for Xpert Xpress SARS-CoV-2/FLU/RSV testing.  Fact Sheet for Patients: EntrepreneurPulse.com.au  Fact Sheet for Healthcare Providers: IncredibleEmployment.be  This test is not yet approved or cleared by the [place] FDA and has been authorized for detection and/or diagnosis of SARS-CoV-2 by FDA under an Emergency Use Authorization (EUA). This EUA will remain in effect (meaning this test can be used) for the duration of the COVID-19 declaration under Section 564(b)(1) of the Act, 21 U.S.C. section 360bbb-3(b)(1), unless the authorization is terminated or revoked.  Performed at [facility], [place] [address] [place], [place] [address]   Aerobic/Anaerobic Culture w Gram Stain (surgical/deep wound)     Status: None (Preliminary result)   Collection Time:  07/16/21  4:20 PM   Specimen: Abscess  Result Value Ref Range Status   Specimen Description   Final    ABSCESS RIGHT ARM  Performed at [facility], [place] [address], [place], [place] [address]    Special Requests   Final    NONE Performed at [facility], [place] [address] [place], [place] [address]    Gram Stain   Final    ABUNDANT WBC PRESENT,BOTH PMN AND MONONUCLEAR NO ORGANISMS SEEN    Culture   Final    NO GROWTH 4 DAYS NO ANAEROBES ISOLATED; CULTURE IN PROGRESS FOR 5 DAYS Performed at [facility], [place] [address] [place], [place] [address]    Report Status PENDING  Incomplete  Aerobic/Anaerobic Culture w Gram Stain (surgical/deep wound)     Status: None (Preliminary result)   Collection Time: 07/16/21  4:25 PM   Specimen: Joint, Right Shoulder; Synovial Fluid  Result Value Ref Range Status   Specimen Description   Final    JOINT FLUID RIGHT SHOULDER Performed at [facility], [place] [address] [place], [place] [address]    Special Requests   Final    NONE Performed at [facility], [place] [address] [place], [place] [address]    Gram Stain   Final    FEW WBC PRESENT, PREDOMINANTLY PMN NO ORGANISMS SEEN    Culture   Final    NO GROWTH 4 DAYS NO ANAEROBES ISOLATED; CULTURE IN PROGRESS FOR 5 DAYS Performed at [place] [address], [place], [place] [address]    Report Status PENDING  Incomplete  Resp Panel by RT-PCR (Flu A&B, Covid) Nasopharyngeal Swab     Status: None   Collection Time: 07/17/21 11:13 AM   Specimen: Nasopharyngeal Swab; Nasopharyngeal(NP) swabs in vial transport medium  Result Value Ref Range Status   SARS Coronavirus 2 by RT PCR NEGATIVE NEGATIVE Final    Comment: (NOTE) SARS-CoV-2 target nucleic acids are NOT DETECTED.  The SARS-CoV-2 RNA is generally detectable in upper respiratory specimens during the acute phase of infection. The lowest concentration of  SARS-CoV-2 viral copies this assay can detect is 138 copies/mL. A negative result does not preclude SARS-Cov-2 infection and should not be used as the sole basis for treatment or other patient management decisions. A negative result may occur with  improper specimen collection/handling, submission of specimen other than nasopharyngeal swab, presence of viral mutation(s) within the areas targeted by this assay, and inadequate number of viral copies(<138 copies/mL). A negative result must be combined with clinical observations, patient history, and epidemiological information. The expected result is Negative.  Fact Sheet for Patients:  EntrepreneurPulse.com.au  Fact Sheet for Healthcare Providers:  IncredibleEmployment.be  This test is no t yet approved or cleared by the [place] FDA and  has been authorized for detection and/or diagnosis of SARS-CoV-2 by FDA under an Emergency Use Authorization (EUA). This EUA will remain  in effect (meaning this test can be used) for the duration of the COVID-19 declaration under Section 564(b)(1) of the Act, 21 U.S.C.section 360bbb-3(b)(1), unless the authorization is terminated  or revoked sooner.       Influenza A by PCR NEGATIVE NEGATIVE Final   Influenza B by PCR NEGATIVE NEGATIVE Final    Comment: (NOTE) The Xpert Xpress SARS-CoV-2/FLU/RSV plus assay is intended as an aid in the diagnosis of influenza from Nasopharyngeal swab specimens and should not be used as a sole basis for treatment. Nasal washings and aspirates are unacceptable for Xpert Xpress SARS-CoV-2/FLU/RSV testing.  Fact Sheet for Patients: EntrepreneurPulse.com.au  Fact Sheet for Healthcare Providers: IncredibleEmployment.be  This test  is not yet approved or cleared by the [place] and has been authorized for detection and/or diagnosis of SARS-CoV-2 by FDA under an Emergency Use  Authorization (EUA). This EUA will remain in effect (meaning this test can be used) for the duration of the COVID-19 declaration under Section 564(b)(1) of the Act, 21 U.S.C. section 360bbb-3(b)(1), unless the authorization is terminated or revoked.  Performed at [facility], [place] [address] [place], [place] [address]     Studies/Results: CT SHOULDER RIGHT W CONTRAST  Result Date: 07/19/2021 CLINICAL DATA:  Fall 1 week ago. Shoulder pain. Concern for septic arthritis. Right upper arm fluid collection aspiration 3 days ago. EXAM: CT OF THE UPPER RIGHT EXTREMITY WITH CONTRAST TECHNIQUE: Multidetector CT imaging of the right shoulder was performed according to the standard protocol following intravenous contrast administration. CONTRAST:  41mL OMNIPAQUE IOHEXOL 350 MG/ML SOLN COMPARISON:  Images during shoulder aspiration 07/16/2021. CT 07/08/2021. FINDINGS: Bones/Joint/Cartilage Status post right total shoulder arthroplasty without evidence of acute fracture, dislocation or hardware loosening. Unchanged anterior inferior subluxation of the humeral head relative to the glenoid with underlying chronic Lynn oil bone loss. No evidence of acute bone destruction. Previously demonstrated fracture involving the base of the acromion is slightly more displaced (6 mm). Stable postsurgical changes of the distal clavicle and acromion. A moderate-sized right shoulder effusion is partly obscured by artifact from the orthopedic hardware, but is grossly unchanged. Ligaments Suboptimally assessed by CT. Muscles and Tendons Chronic atrophy of the supraspinatus, infraspinatus and subscapularis muscles again noted. Soft tissues The previously demonstrated fluid collection anterior to the right shoulder appears slightly smaller, measuring approximately 3.8 x 1.9 cm on image 44/7 (previously 7.7 x 2.7 cm). A small amount of gas is present in this collection, decreased from the previous study. No enlarging  periarticular fluid collections are identified. Right lung interstitial prominence and a cardiac pacemaker are noted. IMPRESSION: 1. The previously aspirated fluid collection anterior to the right shoulder has decreased in size. No new or enlarging fluid collections identified. 2. The moderate-sized joint effusion is grossly unchanged in volume, suboptimally visualized due to artifact from the shoulder arthroplasty. 3. Interval mildly increased displacement of the fracture involving the base of the acromion. No acute osseous findings or specific evidence of osteomyelitis. 4. Chronic severe atrophy of the right shoulder rotator cuff musculature with narrowing of the subacromial space and anterior inferior subluxation of the humeral head. Electronically Signed   By: Richardean Sale M.D.   On: 07/19/2021 18:04   VAS [place] LOWER EXTREMITY VENOUS (DVT)  Result Date: 07/20/2021  Lower Venous DVT Study Patient Name:  Katelyn Jackson  Date of Exam:   07/20/2021 Medical Rec #: [id number]          Accession #:    [id number] Date of Birth: Mar 26, 1946          Patient Gender: F Patient Age:   65 years Exam Location:  [facility] Procedure:      VAS [place] LOWER EXTREMITY VENOUS (DVT) Referring Phys: [name] --------------------------------------------------------------------------------  Indications: Pain.  Risk Factors: None identified. Limitations: Body habitus and poor ultrasound/tissue interface. Comparison Study: No prior studies. Performing Technologist: Oliver Hum RVT  Examination Guidelines: A complete evaluation includes B-mode imaging, spectral Doppler, color Doppler, and power Doppler as needed of all accessible portions of each vessel. Bilateral testing is considered an integral part of a complete examination. Limited examinations for reoccurring indications may be performed as noted. The reflux portion of the exam  is performed with the patient in reverse Trendelenburg.   +---------+---------------+---------+-----------+----------+-------------------+ RIGHT    CompressibilityPhasicitySpontaneityPropertiesThrombus Aging      +---------+---------------+---------+-----------+----------+-------------------+ CFV      Full           Yes      Yes                                      +---------+---------------+---------+-----------+----------+-------------------+ SFJ      Full                                                             +---------+---------------+---------+-----------+----------+-------------------+ FV Prox  Full                                                             +---------+---------------+---------+-----------+----------+-------------------+ FV Mid   Full                                                             +---------+---------------+---------+-----------+----------+-------------------+ FV Distal               Yes      Yes                                      +---------+---------------+---------+-----------+----------+-------------------+ PFV      Full                                                             +---------+---------------+---------+-----------+----------+-------------------+ POP      Full           Yes      Yes                                      +---------+---------------+---------+-----------+----------+-------------------+ PTV      Full                                                             +---------+---------------+---------+-----------+----------+-------------------+ PERO                                                  Not well visualized +---------+---------------+---------+-----------+----------+-------------------+   +---------+---------------+---------+-----------+----------+--------------+  LEFT     CompressibilityPhasicitySpontaneityPropertiesThrombus Aging +---------+---------------+---------+-----------+----------+--------------+ CFV      Full            Yes      Yes                                 +---------+---------------+---------+-----------+----------+--------------+ SFJ      Full                                                        +---------+---------------+---------+-----------+----------+--------------+ FV Prox  Full                                                        +---------+---------------+---------+-----------+----------+--------------+ FV Mid   Full                                                        +---------+---------------+---------+-----------+----------+--------------+ FV Distal               Yes      Yes                                 +---------+---------------+---------+-----------+----------+--------------+ PFV      Full                                                        +---------+---------------+---------+-----------+----------+--------------+ POP      Full           Yes      Yes                                 +---------+---------------+---------+-----------+----------+--------------+ PTV      Full                                                        +---------+---------------+---------+-----------+----------+--------------+ PERO     Full                                                        +---------+---------------+---------+-----------+----------+--------------+     Summary: RIGHT: - There is no evidence of deep vein thrombosis in the lower extremity. However, portions of this examination were limited- see technologist comments above.  - No cystic structure found in the popliteal fossa.  LEFT: - There is no evidence of deep  vein thrombosis in the lower extremity. However, portions of this examination were limited- see technologist comments above.  - No cystic structure found in the popliteal fossa.  *See table(s) above for measurements and observations. Electronically signed by Harold Barban MD on 07/20/2021 at 7:12:23 PM.    Final        Assessment/Plan:  INTERVAL HISTORY: INR still at 5.1   Principal Problem:   Abscess of right shoulder Active Problems:   Essential hypertension   Chronic atrial fibrillation (HCC)   Lymphedema   Malignant neoplasm of upper-outer quadrant of left breast in female, estrogen receptor positive ([place])   Anemia of chronic disease   Acute renal failure superimposed on stage 3b chronic kidney disease (HCC)   Morbid obesity (HCC)   GERD (gastroesophageal reflux disease)   Hypothyroidism   Supratherapeutic INR   Pressure injury of skin   History of pacemaker   Acute metabolic encephalopathy    Katelyn Jackson is a 75 y.o. female with [place] medical problems including atrial fibrillation on Coumadin, morbid obesity chronic lymphedema admitted with systemic infections concerning for infection and with significant fluid above and in the right shoulder where she has had placement of prosthetic shoulder in May 2022, also with pacemaker that was placed this year.  #1 Prosthetic joint infection: This is the only diagnosis that makes sense to me for her presentation and why she was so sick and why she persisted in having fevers on antibiotics.  Fortunately she is not ready yet to go back to the operating room in particular with her supratherapeutic INR.  Would continue to hold antibiotics in anticipation of her the operating room for I&D and potential resection of arthroplasty with cultures obtained in the OR.  Hopefully INR can be reversed and will trend down.  #2 alleged cellulitis: I am quite still skeptical that she had cellulitis based on my review of her images and the fact that she has hyperesthesia of both of her legs at least on exam yesterday.  3.  Atrial fibrillation on anticoagulation.  I think this admission shows why a different anticoagulant would be preferable for her.  I spent 37 minutes with the patient including  face to face counseling of the patient iPad [name]  translator regarding her right shoulder which I believe is infected, need for joint medical and surgical treatment of this, her atrial fibrillation her pacemaker, personally reviewing CT of the shoulder calm, updated INR 3 today which is 5.1 CBC differential, metabolic panel updated culture data long with review of medical records in preparation for the visit and during the visit and in coordination of her care with Dr. Sabino Gasser.   LOS: 19 days   Alcide Evener 07/21/2021, 2:37 PM

## 2021-07-21 NOTE — Assessment & Plan Note (Signed)
secondary to fluid overload,acute on chronic systolic CHF,hypercapnia likely from OSA. Respiratory status much improved.Patient had adequate diuresis with negative fluid balance. Continue supplemental oxygen to keep saturation above 94%. Diuretics changed to torsemide - now on RA

## 2021-07-21 NOTE — Assessment & Plan Note (Addendum)
-  patient has history of CKD3b. Baseline creat ~ 1.4 - 1.5, eGFR 31 - 41 - has been on torsemide 40 mg BID likely contributing to overdiuresis; not consistent with a hepatorenal syndrome at this time - torsemide stopped on 9/15 - s/p IVF resuscitation with good improvement in renal function - continue holding diuretic and will resume as needed

## 2021-07-21 NOTE — Assessment & Plan Note (Signed)
-   mild erythema on admission from media pic; now treated and resolved

## 2021-07-21 NOTE — Progress Notes (Signed)
Occupational Therapy Treatment Patient Details Name: Katelyn Jackson MRN: [id number] DOB: [birth year] Today's Date: 07/21/2021   History of present illness Katelyn Jackson is a 75 y.o. female brought by son to the ER for further evaluation of recent fall. Patient's son at the bedside is the historian:.  He tells me that patient fell from the bed on Monday and since then she is complaining of left shoulder pain, left ankle pain and bilateral knee pain, she is unable to walk, stand, go to bathroom and progressively declining. PMH: permanent A. fib, HTN, hyperlipidemia, s/p pacemaker placement, breast cancer, hypothyroidism, chronic bilateral lymphedema, recurrent falls, obesity, GERD, pulmonary hypertension   OT comments  Patient reported R shoulder felt " a little better" today with patient attempting to use RUE with grooming tasks sitting on edge of bed. Patient was noted to make attempts to assist with rolling and maintaining side-lying during session. Patient reported pain in L side of back today. Nurse made aware.Patient's discharge plan remains appropriate at this time. OT will continue to follow acutely.     Recommendations for follow up therapy are one component of a multi-disciplinary discharge planning process, led by the attending physician.  Recommendations may be updated based on patient status, additional functional criteria and insurance authorization.    Follow Up Recommendations  SNF    Equipment Recommendations  Other (comment)    Recommendations for Other Services      Precautions / Restrictions Precautions Precautions: Fall Precaution Comments: leg pain, right shoulder TSA now painful Restrictions Weight Bearing Restrictions: No Other Position/Activity Restrictions: speaks russian       Mobility Bed Mobility Overal bed mobility: Needs Assistance   Rolling: Total assist   Supine to sit: +2 for physical assistance;+2 for safety/equipment;Max assist;Total  assist;HOB elevated Sit to supine: Total assist;Max assist;+2 for physical assistance;+2 for safety/equipment   General bed mobility comments: patient prefers to roll to R side with patient attempting to assist with maintaining sidelying for hygiene tasks. patient required physical assistance for supine to sit on edge of bed with movement for BLE and trunk.    Transfers                      Balance Overall balance assessment: Needs assistance Sitting-balance support: Single extremity supported Sitting balance-Leahy Scale: Fair Sitting balance - Comments: patient wasn oted to lean to R side today with noted pain in L side of back                                   ADL either performed or assessed with clinical judgement   ADL Overall ADL's : Needs assistance/impaired     Grooming: Sitting;Brushing hair Grooming Details (indicate cue type and reason): able to brush her hair with left hand at edge of bed. patient able to use RUE for set up of task to get toothpaste on brush.                               General ADL Comments: patient was able to participate in sitting on edge of bed for brushing teeth and combing hair with increased time. patietn was noted to be able to use RUE to support for balance. patient indicated pain in L side of back with nurse made aware. patient was offloading to prevent pressure on L side of back.  Vision Patient Visual Report: No change from baseline     Perception     Praxis      Cognition Arousal/Alertness: Awake/alert Behavior During Therapy: WFL for tasks assessed/performed Overall Cognitive Status: Within Functional Limits for tasks assessed                                 General Comments: Patient able to understand some english but interpreter Ipad was used Mickel Baas)        Exercises     Shoulder Instructions       General Comments      Pertinent Vitals/ Pain       Pain  Assessment: Faces Faces Pain Scale: Hurts little more Pain Location: L side of low back Pain Descriptors / Indicators: Guarding;Discomfort;Grimacing Pain Intervention(s): Limited activity within patient's tolerance;Monitored during session;Repositioned  Home Living                                          Prior Functioning/Environment              Frequency  Min 2X/week        Progress Toward Goals  OT Goals(current goals can now be found in the care plan section)     Acute Rehab OT Goals Patient Stated Goal: to stand and walk again  Plan Discharge plan remains appropriate    Co-evaluation                 AM-PAC OT "6 Clicks" Daily Activity     Outcome Measure   Help from another person eating meals?: A Little Help from another person taking care of personal grooming?: A Little Help from another person toileting, which includes using toliet, bedpan, or urinal?: Total Help from another person bathing (including washing, rinsing, drying)?: A Lot Help from another person to put on and taking off regular upper body clothing?: A Lot Help from another person to put on and taking off regular lower body clothing?: Total 6 Click Score: 12    End of Session    OT Visit Diagnosis: Muscle weakness (generalized) (M62.81);Pain Pain - Right/Left: Right Pain - part of body: Shoulder   Activity Tolerance Patient tolerated treatment well   Patient Left in bed;with call bell/phone within reach;with nursing/sitter in room   Nurse Communication Other (comment) (patients request for medicinal cream on back. nurse cleared patient to participate in session)        Time: 6834-1962 OT Time Calculation (min): 34 min  Charges: OT General Charges $OT Visit: 1 Visit OT Treatments $Self Care/Home Management : 23-37 mins  [name] OTR/L, [facility] Office# [phone] Pager# [phone]   Rosita 07/21/2021,  9:31 AM

## 2021-07-21 NOTE — Hospital Course (Addendum)
Katelyn Jackson is a 75 yo female with PMH permanent Afib (on Coumadin), HTN, HLD, pacer in place, hx breast cancer, hypothyroidism, chronic bilateral lower extremity lymphedema, morbid obesity, recurrent falls, GERD, pulmonary hypertension who presented to the hospital with worsening weakness and falls at home.  She had endorsed pain in her left ankle, knee, and shoulder. She was initially treated for suspected cellulitis in her left leg due to some increased erythema noted on admission. She was also noted to have a wound over her right shoulder and underwent further work-up for this as well.  See below for further A&P.

## 2021-07-21 NOTE — Assessment & Plan Note (Signed)
-   Stable edema.  No further signs of cellulitis involving lower extremities

## 2021-07-21 NOTE — Assessment & Plan Note (Signed)
-   Complicates overall prognosis and care - Body mass index is 43.36 kg/m. - Weight Loss and Dietary Counseling given

## 2021-07-21 NOTE — Assessment & Plan Note (Signed)
-  Continue Coreg 

## 2021-07-21 NOTE — Plan of Care (Signed)
  Problem: Activity: Goal: Risk for activity intolerance will decrease Outcome: Progressing   Problem: Nutrition: Goal: Adequate nutrition will be maintained Outcome: Progressing   Problem: Elimination: Goal: Will not experience complications related to bowel motility Outcome: Progressing   Problem: Pain Managment: Goal: General experience of comfort will improve Outcome: Progressing   

## 2021-07-21 NOTE — Assessment & Plan Note (Addendum)
-   s/p right shoulder hemiarthroplasty in May 2022 - s/p aspiration of shoulder on 07/16/21 but appears fluid too thick for likely a good sample and pus was noted during procedure - agree with ID this needs washout as aspiration will be ineffective; she has ongoing pain which is confounded some by her chronic degeneration and atrophy of rotator cuff and narrowing of subacromial space seen on CT - INR does respond to Vit K and after further workup appears elevated INR due to underlying cirrhosis with likely some synthetic dysfunction of liver - undergoing shoulder washout and hardware removal on 9/19; follow up results

## 2021-07-21 NOTE — Assessment & Plan Note (Addendum)
-   on Coumadin at home prior to admission - holding anticoagulation for now in setting of need for surgery - see INR workup as well  - given new diagnosis of cirrhosis, will transition her to Eliquis once resuming anticoagulation

## 2021-07-21 NOTE — Assessment & Plan Note (Signed)
Continue Synthroid °

## 2021-07-21 NOTE — Progress Notes (Signed)
Progress Note    Katelyn Jackson   QBH:[id number]  DOB: [birth year]  DOA: 07/01/2021     19  PCP: Katelyn Anger, MD  Initial CC: Left-sided pain in ankle, knee, shoulder and falling at home  Hospital Course: Katelyn Jackson is a 75 yo female with PMH permanent Afib (on Coumadin), HTN, HLD, pacer in place, hx breast cancer, hypothyroidism, chronic bilateral lower extremity lymphedema, morbid obesity, recurrent falls, GERD, pulmonary hypertension who presented to the hospital with worsening weakness and falls at home.  She had endorsed pain in her left ankle, knee, and shoulder. She was initially treated for suspected cellulitis in her left leg due to some increased erythema noted on admission. She was also noted to have a wound over her right shoulder and underwent further work-up for this as well.  Interval History:  No events overnight.  Patient slightly poor historian this morning which is my first time meeting her.  Spoke with her son who was bedside later in the morning and he was able to provide further collateral information.  He states that she gets like this occasionally every few days which sounds like some probable hospital delirium.  He said yesterday she was much more alert and oriented. He understands the need for probable shoulder washout as well.  Discussed her ongoing elevated INR and further work-up being initiated along with reversing.  ROS: Constitutional: negative for chills and fevers, Respiratory: negative for cough and wheezing, Cardiovascular: negative for chest pain, and Gastrointestinal: negative for abdominal pain  Assessment & Plan: * Abscess of right shoulder - s/p right shoulder hemiarthroplasty in May 2022 - s/p aspiration of shoulder on 07/16/21 but appears fluid too thick for likely a good sample and pus was noted during procedure - agree with ID this needs washout as aspiration will be ineffective; she has ongoing pain which is confounded some by  her chronic degeneration and atrophy of rotator cuff and narrowing of subacromial space seen on CT - currently INR precluding surgery; I plan to reverse some with Vit K but given her LFTs being abnormal I am also initiating some further workup to rule out liver dysfunction as well (last dose of Coumadin 07/16/21)  Supratherapeutic INR - INR peaked at 9.9 after admission on 8/26 and she was given Vit K to help reverse  - last Coumadin dose 9/9 and INR remains 4-5 (no bleeding) - Alk phos elevated 326 (has been chronically elevated back to 2021), TB elevated (2.8 and today 2.2), NH3 high normal (31 on 9/9) - check GGT - obtain RUQ u/s - differential includes lagging clearance of Coumadin vs acute/chronic liver injury  - giving Vit K 5 mg x 1 today 9/14  Chronic atrial fibrillation ([place]) - on Coumadin at home prior to admission - holding anticoagulation for now in setting of need for surgery - see INR workup as well   Acute renal failure superimposed on stage 3b chronic kidney disease ([place]) - patient has history of CKD3b. Baseline creat ~ 1.4 - 1.5, eGFR 31 - 41 -Watch renal function while on torsemide  Cellulitis of left lower extremity-resolved as of 07/21/2021 - mild erythema on admission from media pic; now treated and resolved   Acute metabolic encephalopathy - Multifactorial and likely some hospital delirium contributing given extended length of stay - patient symptoms include occasional AMS - etiology considered due to toxic/metabolic - continue re-orienting as needed - treating infection  History of pacemaker - stable; at risk for infection seeding without source  control   Hypothyroidism - Continue Synthroid  GERD (gastroesophageal reflux disease) - Continue Protonix  Morbid obesity ([place]) - Complicates overall prognosis and care - Body mass index is 43.36 kg/m. - Weight Loss and Dietary Counseling given  Anemia of chronic disease - Baseline hemoglobin 9 to 10  g/dL -Currently at baseline and no signs of bleeding  Malignant neoplasm of upper-outer quadrant of left breast in female, estrogen receptor positive ([place]) - outpatient follow up as indicated   Lymphedema - Stable edema.  No further signs of cellulitis involving lower extremities  Essential hypertension - Continue Coreg  Acute respiratory failure with hypoxia ([facility])-resolved as of 07/21/2021 secondary to fluid overload,  acute on chronic systolic CHF, hypercapnia likely from OSA. Respiratory status much improved.  Patient had adequate diuresis with negative fluid balance. Continue supplemental oxygen to keep saturation above 94%. Diuretics changed to torsemide  - now on RA    Old records reviewed in assessment of this patient  Antimicrobials:   DVT prophylaxis: SCDs Start: 07/02/21 0948   Code Status:   Code Status: Full Code Family Communication: son, Katelyn Jackson   Disposition Plan: Status is: Inpatient  Remains inpatient appropriate because:Ongoing diagnostic testing needed not appropriate for outpatient work up, IV treatments appropriate due to intensity of illness or inability to take PO, and Inpatient level of care appropriate due to severity of illness  Dispo: The patient is from: Home              Anticipated d/c is to:  pending clinical course              Patient currently is not medically stable to d/c.   Difficult to place patient No      Risk of unplanned readmission score: Unplanned Admission- Pilot do not use: 35.95   Objective: Blood pressure (!) 146/89, pulse (!) 48, temperature 98.7 F (37.1 C), resp. rate 16, height 5' 1"  (1.549 m), weight 104.1 kg, SpO2 95 %.  Examination: General appearance: alert, cooperative, no distress, morbidly obese, and slowed mentation Head: Normocephalic, without obvious abnormality, atraumatic Eyes:  EOMI Lungs: clear to auscultation bilaterally Heart: irregularly irregular rhythm and S1, S2 normal Abdomen:  obese, soft,  NT, BS present Extremities:  right shoulder noted with fluctuant lesion; pain with passive ROM in shoulder; no erythema or edema noted. LE's show chronic lymphedema without erythema or signs of infection Skin: mobility and turgor normal Neurologic: Grossly normal  Consultants:  ID Ortho surgery  Procedures:    Data Reviewed: I have personally reviewed following labs and imaging studies Results for orders placed or performed during the hospital encounter of 07/01/21 (from the past 24 hour(s))  Protime-INR     Status: Abnormal   Collection Time: 07/21/21  4:13 AM  Result Value Ref Range   Prothrombin Time 47.3 (H) 11.4 - 15.2 seconds   INR 5.1 (HH) 0.8 - 1.2  Basic metabolic panel     Status: Abnormal   Collection Time: 07/21/21  4:13 AM  Result Value Ref Range   Sodium 132 (L) 135 - 145 mmol/L   Potassium 3.5 3.5 - 5.1 mmol/L   Chloride 90 (L) 98 - 111 mmol/L   CO2 31 22 - 32 mmol/L   Glucose, Bld 94 70 - 99 mg/dL   BUN 38 (H) 8 - 23 mg/dL   Creatinine, Ser 1.78 (H) 0.44 - 1.00 mg/dL   Calcium 10.0 8.9 - 10.3 mg/dL   GFR, Estimated 29 (L) >60 mL/min   Anion  gap 11 5 - 15  CBC     Status: Abnormal   Collection Time: 07/21/21  4:13 AM  Result Value Ref Range   WBC 8.5 4.0 - 10.5 K/uL   RBC 3.40 (L) 3.87 - 5.11 MIL/uL   Hemoglobin 10.1 (L) 12.0 - 15.0 g/dL   HCT 32.7 (L) 36.0 - 46.0 %   MCV 96.2 80.0 - 100.0 fL   MCH 29.7 26.0 - 34.0 pg   MCHC 30.9 30.0 - 36.0 g/dL   RDW 16.3 (H) 11.5 - 15.5 %   Platelets 236 150 - 400 K/uL   nRBC 0.0 0.0 - 0.2 %  Sedimentation rate     Status: Abnormal   Collection Time: 07/21/21  4:13 AM  Result Value Ref Range   Sed Rate 75 (H) 0 - 22 mm/hr  C-reactive protein     Status: Abnormal   Collection Time: 07/21/21  4:13 AM  Result Value Ref Range   CRP 11.4 (H) <1.0 mg/dL  Hepatic function panel     Status: Abnormal   Collection Time: 07/21/21  4:13 AM  Result Value Ref Range   Total Protein 6.5 6.5 - 8.1 g/dL   Albumin 2.5 (L) 3.5 -  5.0 g/dL   AST 21 15 - 41 U/L   ALT 17 0 - 44 U/L   Alkaline Phosphatase 326 (H) 38 - 126 U/L   Total Bilirubin 2.2 (H) 0.3 - 1.2 mg/dL   Bilirubin, Direct 1.0 (H) 0.0 - 0.2 mg/dL   Indirect Bilirubin 1.2 (H) 0.3 - 0.9 mg/dL  Gamma GT     Status: Abnormal   Collection Time: 07/21/21  1:10 PM  Result Value Ref Range   GGT 184 (H) 7 - 50 U/L    Recent Results (from the past 240 hour(s))  Resp Panel by RT-PCR (Flu A&B, Covid) Nasopharyngeal Swab     Status: None   Collection Time: 07/13/21  6:05 PM   Specimen: Nasopharyngeal Swab; Nasopharyngeal(NP) swabs in vial transport medium  Result Value Ref Range Status   SARS Coronavirus 2 by RT PCR NEGATIVE NEGATIVE Final    Comment: (NOTE) SARS-CoV-2 target nucleic acids are NOT DETECTED.  The SARS-CoV-2 RNA is generally detectable in upper respiratory specimens during the acute phase of infection. The lowest concentration of SARS-CoV-2 viral copies this assay can detect is 138 copies/mL. A negative result does not preclude SARS-Cov-2 infection and should not be used as the sole basis for treatment or other patient management decisions. A negative result may occur with  improper specimen collection/handling, submission of specimen other than nasopharyngeal swab, presence of viral mutation(s) within the areas targeted by this assay, and inadequate number of viral copies(<138 copies/mL). A negative result must be combined with clinical observations, patient history, and epidemiological information. The expected result is Negative.  Fact Sheet for Patients:  EntrepreneurPulse.com.au  Fact Sheet for Healthcare Providers:  IncredibleEmployment.be  This test is no t yet approved or cleared by the [place] FDA and  has been authorized for detection and/or diagnosis of SARS-CoV-2 by FDA under an Emergency Use Authorization (EUA). This EUA will remain  in effect (meaning this test can be used) for the  duration of the COVID-19 declaration under Section 564(b)(1) of the Act, 21 U.S.C.section 360bbb-3(b)(1), unless the authorization is terminated  or revoked sooner.       Influenza A by PCR NEGATIVE NEGATIVE Final   Influenza B by PCR NEGATIVE NEGATIVE Final    Comment: (NOTE) The Xpert  Xpress SARS-CoV-2/FLU/RSV plus assay is intended as an aid in the diagnosis of influenza from Nasopharyngeal swab specimens and should not be used as a sole basis for treatment. Nasal washings and aspirates are unacceptable for Xpert Xpress SARS-CoV-2/FLU/RSV testing.  Fact Sheet for Patients: EntrepreneurPulse.com.au  Fact Sheet for Healthcare Providers: IncredibleEmployment.be  This test is not yet approved or cleared by the [place] FDA and has been authorized for detection and/or diagnosis of SARS-CoV-2 by FDA under an Emergency Use Authorization (EUA). This EUA will remain in effect (meaning this test can be used) for the duration of the COVID-19 declaration under Section 564(b)(1) of the Act, 21 U.S.C. section 360bbb-3(b)(1), unless the authorization is terminated or revoked.  Performed at [facility], [place] [address] [place], [place] [address]   Aerobic/Anaerobic Culture w Gram Stain (surgical/deep wound)     Status: None (Preliminary result)   Collection Time: 07/16/21  4:20 PM   Specimen: Abscess  Result Value Ref Range Status   Specimen Description   Final    ABSCESS RIGHT ARM Performed at [facility] [address] [place], [place] [address]    Special Requests   Final    NONE Performed at [facility], [place] [address] [place], [place] [address]    Gram Stain   Final    ABUNDANT WBC PRESENT,BOTH PMN AND MONONUCLEAR NO ORGANISMS SEEN    Culture   Final    NO GROWTH 4 DAYS NO ANAEROBES ISOLATED; CULTURE IN PROGRESS FOR 5 DAYS Performed at [facility], [place] [address], [place], [place] [address]    Report Status PENDING  Incomplete  Aerobic/Anaerobic Culture w Gram Stain (surgical/deep wound)     Status: None (Preliminary result)   Collection Time: 07/16/21  4:25 PM   Specimen: Joint, Right Shoulder; Synovial Fluid  Result Value Ref Range Status   Specimen Description   Final    JOINT FLUID RIGHT SHOULDER Performed at [facility], [place] [address], [place], [place] [address]    Special Requests   Final    NONE Performed at [facility], [place] [address], [place], [place] [address]    Gram Stain   Final    FEW WBC PRESENT, PREDOMINANTLY PMN NO ORGANISMS SEEN    Culture   Final    NO GROWTH 4 DAYS NO ANAEROBES ISOLATED; CULTURE IN PROGRESS FOR 5 DAYS Performed at [place] [address], [place], [place] [address]    Report Status PENDING  Incomplete  Resp Panel by RT-PCR (Flu A&B, Covid) Nasopharyngeal Swab     Status: None   Collection Time: 07/17/21 11:13 AM   Specimen: Nasopharyngeal Swab; Nasopharyngeal(NP) swabs in vial transport medium  Result Value Ref Range Status   SARS Coronavirus 2 by RT PCR NEGATIVE NEGATIVE Final    Comment: (NOTE) SARS-CoV-2 target nucleic acids are NOT DETECTED.  The SARS-CoV-2 RNA is generally detectable in upper respiratory specimens during the acute phase of infection. The lowest concentration of SARS-CoV-2 viral copies this assay can detect is 138 copies/mL. A negative result does not preclude SARS-Cov-2 infection and should not be used as the sole basis for treatment or other patient management decisions. A negative result may occur with  improper specimen collection/handling, submission of specimen other than nasopharyngeal swab, presence of viral mutation(s) within the areas targeted by this assay, and inadequate number of viral copies(<138 copies/mL). A negative result must be combined with clinical  observations, patient history, and  epidemiological information. The expected result is Negative.  Fact Sheet for Patients:  EntrepreneurPulse.com.au  Fact Sheet for Healthcare Providers:  IncredibleEmployment.be  This test is no t yet approved or cleared by the [place] FDA and  has been authorized for detection and/or diagnosis of SARS-CoV-2 by FDA under an Emergency Use Authorization (EUA). This EUA will remain  in effect (meaning this test can be used) for the duration of the COVID-19 declaration under Section 564(b)(1) of the Act, 21 U.S.C.section 360bbb-3(b)(1), unless the authorization is terminated  or revoked sooner.       Influenza A by PCR NEGATIVE NEGATIVE Final   Influenza B by PCR NEGATIVE NEGATIVE Final    Comment: (NOTE) The Xpert Xpress SARS-CoV-2/FLU/RSV plus assay is intended as an aid in the diagnosis of influenza from Nasopharyngeal swab specimens and should not be used as a sole basis for treatment. Nasal washings and aspirates are unacceptable for Xpert Xpress SARS-CoV-2/FLU/RSV testing.  Fact Sheet for Patients: EntrepreneurPulse.com.au  Fact Sheet for Healthcare Providers: IncredibleEmployment.be  This test is not yet approved or cleared by the [place] FDA and has been authorized for detection and/or diagnosis of SARS-CoV-2 by FDA under an Emergency Use Authorization (EUA). This EUA will remain in effect (meaning this test can be used) for the duration of the COVID-19 declaration under Section 564(b)(1) of the Act, 21 U.S.C. section 360bbb-3(b)(1), unless the authorization is terminated or revoked.  Performed at [facility], [place] [address], [place], [place] [address]      Radiology Studies: CT SHOULDER RIGHT W CONTRAST  Result Date: 07/19/2021 CLINICAL DATA:  Fall 1 week ago. Shoulder pain. Concern for septic arthritis. Right upper arm fluid collection aspiration 3 days ago.  EXAM: CT OF THE UPPER RIGHT EXTREMITY WITH CONTRAST TECHNIQUE: Multidetector CT imaging of the right shoulder was performed according to the standard protocol following intravenous contrast administration. CONTRAST:  42m OMNIPAQUE IOHEXOL 350 MG/ML SOLN COMPARISON:  Images during shoulder aspiration 07/16/2021. CT 07/08/2021. FINDINGS: Bones/Joint/Cartilage Status post right total shoulder arthroplasty without evidence of acute fracture, dislocation or hardware loosening. Unchanged anterior inferior subluxation of the humeral head relative to the glenoid with underlying chronic Lynn oil bone loss. No evidence of acute bone destruction. Previously demonstrated fracture involving the base of the acromion is slightly more displaced (6 mm). Stable postsurgical changes of the distal clavicle and acromion. A moderate-sized right shoulder effusion is partly obscured by artifact from the orthopedic hardware, but is grossly unchanged. Ligaments Suboptimally assessed by CT. Muscles and Tendons Chronic atrophy of the supraspinatus, infraspinatus and subscapularis muscles again noted. Soft tissues The previously demonstrated fluid collection anterior to the right shoulder appears slightly smaller, measuring approximately 3.8 x 1.9 cm on image 44/7 (previously 7.7 x 2.7 cm). A small amount of gas is present in this collection, decreased from the previous study. No enlarging periarticular fluid collections are identified. Right lung interstitial prominence and a cardiac pacemaker are noted. IMPRESSION: 1. The previously aspirated fluid collection anterior to the right shoulder has decreased in size. No new or enlarging fluid collections identified. 2. The moderate-sized joint effusion is grossly unchanged in volume, suboptimally visualized due to artifact from the shoulder arthroplasty. 3. Interval mildly increased displacement of the fracture involving the base of the acromion. No acute osseous findings or specific evidence of  osteomyelitis. 4. Chronic severe atrophy of the right shoulder rotator cuff musculature with narrowing of the subacromial space and anterior inferior subluxation of the  humeral head. Electronically Signed   By: Richardean Sale M.D.   On: 07/19/2021 18:04   VAS [place] LOWER EXTREMITY VENOUS (DVT)  Result Date: 07/20/2021  Lower Venous DVT Study Patient Name:  SHANEA KARNEY  Date of Exam:   07/20/2021 Medical Rec #: [id number]          Accession #:    [id number] Date of Birth: 1946-06-20          Patient Gender: F Patient Age:   89 years Exam Location:  [facility] Procedure:      VAS [place] LOWER EXTREMITY VENOUS (DVT) Referring Phys: [name] --------------------------------------------------------------------------------  Indications: Pain.  Risk Factors: None identified. Limitations: Body habitus and poor ultrasound/tissue interface. Comparison Study: No prior studies. Performing Technologist: Oliver Hum RVT  Examination Guidelines: A complete evaluation includes B-mode imaging, spectral Doppler, color Doppler, and power Doppler as needed of all accessible portions of each vessel. Bilateral testing is considered an integral part of a complete examination. Limited examinations for reoccurring indications may be performed as noted. The reflux portion of the exam is performed with the patient in reverse Trendelenburg.  +---------+---------------+---------+-----------+----------+-------------------+ RIGHT    CompressibilityPhasicitySpontaneityPropertiesThrombus Aging      +---------+---------------+---------+-----------+----------+-------------------+ CFV      Full           Yes      Yes                                      +---------+---------------+---------+-----------+----------+-------------------+ SFJ      Full                                                             +---------+---------------+---------+-----------+----------+-------------------+ FV Prox  Full                                                              +---------+---------------+---------+-----------+----------+-------------------+ FV Mid   Full                                                             +---------+---------------+---------+-----------+----------+-------------------+ FV Distal               Yes      Yes                                      +---------+---------------+---------+-----------+----------+-------------------+ PFV      Full                                                             +---------+---------------+---------+-----------+----------+-------------------+ POP  Full           Yes      Yes                                      +---------+---------------+---------+-----------+----------+-------------------+ PTV      Full                                                             +---------+---------------+---------+-----------+----------+-------------------+ PERO                                                  Not well visualized +---------+---------------+---------+-----------+----------+-------------------+   +---------+---------------+---------+-----------+----------+--------------+ LEFT     CompressibilityPhasicitySpontaneityPropertiesThrombus Aging +---------+---------------+---------+-----------+----------+--------------+ CFV      Full           Yes      Yes                                 +---------+---------------+---------+-----------+----------+--------------+ SFJ      Full                                                        +---------+---------------+---------+-----------+----------+--------------+ FV Prox  Full                                                        +---------+---------------+---------+-----------+----------+--------------+ FV Mid   Full                                                        +---------+---------------+---------+-----------+----------+--------------+  FV Distal               Yes      Yes                                 +---------+---------------+---------+-----------+----------+--------------+ PFV      Full                                                        +---------+---------------+---------+-----------+----------+--------------+ POP      Full           Yes      Yes                                 +---------+---------------+---------+-----------+----------+--------------+  PTV      Full                                                        +---------+---------------+---------+-----------+----------+--------------+ PERO     Full                                                        +---------+---------------+---------+-----------+----------+--------------+     Summary: RIGHT: - There is no evidence of deep vein thrombosis in the lower extremity. However, portions of this examination were limited- see technologist comments above.  - No cystic structure found in the popliteal fossa.  LEFT: - There is no evidence of deep vein thrombosis in the lower extremity. However, portions of this examination were limited- see technologist comments above.  - No cystic structure found in the popliteal fossa.  *See table(s) above for measurements and observations. Electronically signed by Harold Barban MD on 07/20/2021 at 7:12:23 PM.    Final    VAS [place] LOWER EXTREMITY VENOUS (DVT)  Final Result    CT SHOULDER RIGHT W CONTRAST  Final Result    IR US Guide Bx Asp/Drain  Final Result    IR DRAIN/INJ MAJOR JOINT/BURSA  Final Result    CT SHOULDER RIGHT WO CONTRAST  Final Result    DG Shoulder Right  Final Result    VAS [place] UPPER EXTREMITY VENOUS DUPLEX  Final Result    CT LUMBAR SPINE WO CONTRAST  Final Result    CT PELVIS WO CONTRAST  Final Result    CT KNEE LEFT WO CONTRAST  Final Result    CT KNEE RIGHT WO CONTRAST  Final Result    DG Ankle 2 Views Left  Final Result    CT HEAD WO CONTRAST (5MM)  Final  Result    DG Chest 2 View  Final Result    DG Knee Complete 4 Views Right  Final Result    DG Knee Complete 4 Views Left  Final Result    US Abdomen Limited RUQ (LIVER/GB)    (Results Pending)    Scheduled Meds:  carvedilol  6.25 mg Oral BID WC   cholecalciferol  2,000 Units Oral Daily   exemestane  25 mg Oral QPC breakfast   levothyroxine  50 mcg Oral QAC breakfast   magnesium oxide  400 mg Oral BID   pantoprazole  40 mg Oral Daily   potassium chloride  20 mEq Oral Daily   pravastatin  20 mg Oral q1800   torsemide  40 mg Oral BID   vitamin B-12  2,000 mcg Oral Daily   PRN Meds: sodium chloride, acetaminophen **OR** acetaminophen, diclofenac Sodium, lipase/protease/amylase, ondansetron **OR** ondansetron (ZOFRAN) IV, oxyCODONE, polyethylene glycol, polyvinyl alcohol, simethicone, traMADol, witch hazel-glycerin Continuous Infusions:  sodium chloride 250 mL (07/16/21 1319)     LOS: 19 days  Time spent: Greater than 50% of the 35 minute visit was spent in counseling/coordination of care for the patient as laid out in the A&P.   Dwyane Dee, MD [facility] 07/21/2021, 2:26 PM

## 2021-07-21 NOTE — Assessment & Plan Note (Addendum)
-   INR peaked at 9.9 after admission on 8/26 and she was given Vit K to help reverse  - last Coumadin dose 9/9  - see cirrhosis now as well - can give Vit K and/or FFP pending timing of hopeful shoulder washout; currently INR remains <2

## 2021-07-21 NOTE — Assessment & Plan Note (Signed)
-   Baseline hemoglobin 9 to 10 g/dL -Currently at baseline and no signs of bleeding

## 2021-07-21 NOTE — Assessment & Plan Note (Signed)
Continue Protonix °

## 2021-07-21 NOTE — Assessment & Plan Note (Addendum)
-   stable; at risk for infection seeding without source control  - implanted at [facility] on 01/06/21: Sultan pacemaker with model number of ASSURITY MRI PM 2272 and serial number of [id number]

## 2021-07-21 NOTE — Progress Notes (Signed)
ORTHOPAEDIC PROGRESS NOTE  BRIEF HISTORY: Patient has a complex right shoulder. Her shoulder had atypical degenerative changes with a scapular fracture that appeared chronic and essentially complete loss of the glenoid vault leaving the remnant of the scapula that was not unreconstructable.  Due to the atypical nature of her proximal humerus we felt that the differential diagnosis included infection versus cancer versus less likely a occult fracture that is gone on to malunion and AVN over time. She underwent a right shoulder hemiarthroplasty in May 2022 to try and improve her function. She did well after surgery until recently. She had a few falls and was admitted to the hospital on August 26th. At that time, she was complaining more of lower leg pain. All imaging was negative for occult fracture. Imaging of her right shoulder continued to show a chronic dislocation. She began to complain of more right shoulder pain. It was noted that she had a possible abscess on the distal aspect of her incision, which was not noted before her admission. This began to increase in size.   SUBJECTIVE: Patient had ultrasound guided aspiration of right upper arm abscess and fluoro guided aspiration of right shoulder joint  on 07/16/2021 with IR. Was found to have purulent debris superficially and only a few mL of bloody fluid in the joint, per IR note. Cultures so far have been negative.  She was found to be febrile even while on Vancomycin. Infectious Disease was consulted by primary team.   Patient's INR continues to rise despite being off Warfarin since 9/9.   Today, patient notes her shoulder feels "better" today and she has a little more mobility. Her son, [name], helps translate for her via telephone. Her shoulder and left leg are a pain scale of 5/10 and right leg 3-4/10.   OBJECTIVE: PE: General: resting in hospital bed, NAD, fatigued Right shoulder: bullae is still present, but surrounding erythema has  improved. Reports some tenderness to palpation shoulder. Continues to have pain with range of motion, but reports this is better. Tolerates about 60 degrees passive forward elevation.Limited active forward elevation. Bilateral lower extremity: chronic lymphedema. Actively moves ankle and foot today without much pain. Endorses distal sensation. Warm well perfused feet.   Vitals:   07/20/21 2046 07/21/21 0348  BP: (!) 100/51 (!) 113/50  Pulse: 68 79  Resp: 18 18  Temp: 98.5 F (36.9 C) 98.1 F (36.7 C)  SpO2: 92% 92%     ASSESSMENT: Katelyn Jackson is a 75 y.o. female  - s/p right shoulder hemiarthroplasty for unreconstructable shoulder in May 2022 - right shoulder abscess s/p aspiration on 07/16/2021 with IR - bilateral knee osteoarthritis - chronic bilateral leg lymphedema   PLAN: - Right shoulder: Patient's shoulder chronically appears dislocated due to her unreconstructable shoulder from previous right humerus and scapula fractures with end-stage degenerative changes in the joint. She had a right hemiarthroplasty in May 2022.   - s/p aspiration of superficial abscess and joint on 07/16/2021 with Interventional Radiology  - Cultures currently show no growth, will continue to follow - CT w contrast has been ordered by ID: anterior fluid collection has decreased in size, joint effusion unchanged in volume  Dr. Tommy Medal and Dr. Griffin Basil have discussed this case. Plan is to discontinue antibiotics. She will need to stay off Warfarin in hopes that her INR comes down. INR is currently 5.1, which is too high to safely proceed with any sort of surgical intervention. If she remains symptomatic and INR comes  down, then we will proceed with surgical intervention - I&D and possible resection of prosthesis. If she improves clinically and is asymptomatic, no further treatment will be necessary. We will continue to monitor her progress closely.   [name], PA-C 07/21/2021

## 2021-07-21 NOTE — Assessment & Plan Note (Addendum)
-   Multifactorial and likely some hospital delirium contributing given extended length of stay - patient symptoms include occasional AMS - etiology considered due to toxic/metabolic - continue re-orienting as needed - treating infection - given recently elevated NH3 and now diagnosis consistent with cirrhosis, she is also started on lactulose and rifaximin

## 2021-07-22 DIAGNOSIS — L0291 Cutaneous abscess, unspecified: Secondary | ICD-10-CM | POA: Diagnosis not present

## 2021-07-22 DIAGNOSIS — G9341 Metabolic encephalopathy: Secondary | ICD-10-CM | POA: Diagnosis not present

## 2021-07-22 DIAGNOSIS — N179 Acute kidney failure, unspecified: Secondary | ICD-10-CM | POA: Diagnosis not present

## 2021-07-22 DIAGNOSIS — T8450XA Infection and inflammatory reaction due to unspecified internal joint prosthesis, initial encounter: Secondary | ICD-10-CM

## 2021-07-22 DIAGNOSIS — K7460 Unspecified cirrhosis of liver: Secondary | ICD-10-CM

## 2021-07-22 DIAGNOSIS — K7290 Hepatic failure, unspecified without coma: Secondary | ICD-10-CM | POA: Diagnosis not present

## 2021-07-22 DIAGNOSIS — T8450XD Infection and inflammatory reaction due to unspecified internal joint prosthesis, subsequent encounter: Secondary | ICD-10-CM

## 2021-07-22 DIAGNOSIS — L02413 Cutaneous abscess of right upper limb: Secondary | ICD-10-CM | POA: Diagnosis not present

## 2021-07-22 DIAGNOSIS — R791 Abnormal coagulation profile: Secondary | ICD-10-CM | POA: Diagnosis not present

## 2021-07-22 DIAGNOSIS — I890 Lymphedema, not elsewhere classified: Secondary | ICD-10-CM | POA: Diagnosis not present

## 2021-07-22 LAB — CBC WITH DIFFERENTIAL/PLATELET
Abs Immature Granulocytes: 0.02 10*3/uL (ref 0.00–0.07)
Basophils Absolute: 0.1 10*3/uL (ref 0.0–0.1)
Basophils Relative: 1 %
Eosinophils Absolute: 0.4 10*3/uL (ref 0.0–0.5)
Eosinophils Relative: 6 %
HCT: 32.2 % — ABNORMAL LOW (ref 36.0–46.0)
Hemoglobin: 10.2 g/dL — ABNORMAL LOW (ref 12.0–15.0)
Immature Granulocytes: 0 %
Lymphocytes Relative: 17 %
Lymphs Abs: 1.1 10*3/uL (ref 0.7–4.0)
MCH: 30 pg (ref 26.0–34.0)
MCHC: 31.7 g/dL (ref 30.0–36.0)
MCV: 94.7 fL (ref 80.0–100.0)
Monocytes Absolute: 0.7 10*3/uL (ref 0.1–1.0)
Monocytes Relative: 11 %
Neutro Abs: 4 10*3/uL (ref 1.7–7.7)
Neutrophils Relative %: 65 %
Platelets: 259 10*3/uL (ref 150–400)
RBC: 3.4 MIL/uL — ABNORMAL LOW (ref 3.87–5.11)
RDW: 16.2 % — ABNORMAL HIGH (ref 11.5–15.5)
WBC: 6.2 10*3/uL (ref 4.0–10.5)
nRBC: 0 % (ref 0.0–0.2)

## 2021-07-22 LAB — BASIC METABOLIC PANEL
Anion gap: 10 (ref 5–15)
BUN: 42 mg/dL — ABNORMAL HIGH (ref 8–23)
CO2: 33 mmol/L — ABNORMAL HIGH (ref 22–32)
Calcium: 10.2 mg/dL (ref 8.9–10.3)
Chloride: 90 mmol/L — ABNORMAL LOW (ref 98–111)
Creatinine, Ser: 2.24 mg/dL — ABNORMAL HIGH (ref 0.44–1.00)
GFR, Estimated: 22 mL/min — ABNORMAL LOW (ref >60)
Glucose, Bld: 103 mg/dL — ABNORMAL HIGH (ref 70–99)
Potassium: 3.7 mmol/L (ref 3.5–5.1)
Sodium: 133 mmol/L — ABNORMAL LOW (ref 135–145)

## 2021-07-22 LAB — HEPATITIS B SURFACE ANTIGEN: Hepatitis B Surface Ag: NONREACTIVE

## 2021-07-22 LAB — HEPATIC FUNCTION PANEL
ALT: 14 U/L (ref 0–44)
AST: 22 U/L (ref 15–41)
Albumin: 2.5 g/dL — ABNORMAL LOW (ref 3.5–5.0)
Alkaline Phosphatase: 320 U/L — ABNORMAL HIGH (ref 38–126)
Bilirubin, Direct: 0.9 mg/dL — ABNORMAL HIGH (ref 0.0–0.2)
Indirect Bilirubin: 1.1 mg/dL — ABNORMAL HIGH (ref 0.3–0.9)
Total Bilirubin: 2 mg/dL — ABNORMAL HIGH (ref 0.3–1.2)
Total Protein: 6.9 g/dL (ref 6.5–8.1)

## 2021-07-22 LAB — PROTIME-INR
INR: 2.9 — ABNORMAL HIGH (ref 0.8–1.2)
Prothrombin Time: 30.7 seconds — ABNORMAL HIGH (ref 11.4–15.2)

## 2021-07-22 LAB — MAGNESIUM: Magnesium: 2.8 mg/dL — ABNORMAL HIGH (ref 1.7–2.4)

## 2021-07-22 MED ORDER — LACTULOSE 10 GM/15ML PO SOLN
10.0000 g | Freq: Two times a day (BID) | ORAL | Status: DC
  Administered 2021-07-22 – 2021-07-23 (×3): 10 g via ORAL
  Filled 2021-07-22 (×3): qty 15

## 2021-07-22 MED ORDER — SODIUM CHLORIDE 0.9 % IV SOLN: INTRAVENOUS | Status: DC

## 2021-07-22 MED ORDER — RIFAXIMIN 550 MG PO TABS
550.0000 mg | ORAL_TABLET | Freq: Two times a day (BID) | ORAL | Status: DC
  Administered 2021-07-22 – 2021-07-30 (×16): 550 mg via ORAL
  Filled 2021-07-22 (×16): qty 1

## 2021-07-22 MED ORDER — PHYTONADIONE 5 MG PO TABS
10.0000 mg | ORAL_TABLET | Freq: Once | ORAL | Status: AC
  Administered 2021-07-22: 10 mg via ORAL
  Filled 2021-07-22 (×2): qty 2

## 2021-07-22 NOTE — Progress Notes (Addendum)
Progress Note    Katelyn Jackson   FMB:[id number]  DOB: [birth year]  DOA: 07/01/2021     20  PCP: Cassandria Anger, MD  Initial CC: Left-sided pain in ankle, knee, shoulder and falling at home  Hospital Course: Katelyn Jackson is a 75 yo female with PMH permanent Afib (on Coumadin), HTN, HLD, pacer in place, hx breast cancer, hypothyroidism, chronic bilateral lower extremity lymphedema, morbid obesity, recurrent falls, GERD, pulmonary hypertension who presented to the hospital with worsening weakness and falls at home.  She had endorsed pain in her left ankle, knee, and shoulder. She was initially treated for suspected cellulitis in her left leg due to some increased erythema noted on admission. She was also noted to have a wound over her right shoulder and underwent further work-up for this as well.  See below for further A&P.   Interval History:  No events overnight.  Son present bedside this morning. She still has significant pain in right arm/shoulder with minimal palpation.   ROS: Constitutional: negative for chills and fevers, Respiratory: negative for cough and wheezing, Cardiovascular: negative for chest pain, and Gastrointestinal: negative for abdominal pain  Assessment & Plan: * Abscess of right shoulder - s/p right shoulder hemiarthroplasty in May 2022 - s/p aspiration of shoulder on 07/16/21 but appears fluid too thick for likely a good sample and pus was noted during procedure - agree with ID this needs washout as aspiration will be ineffective; she has ongoing pain which is confounded some by her chronic degeneration and atrophy of rotator cuff and narrowing of subacromial space seen on CT - currently INR precluding surgery; does respond to Vit K and after further workup appears elevated INR due to underlying cirrhosis with likely some synthetic dysfunction of liver - we can give Vit K and FFP in order to bridge thru surgery if/when ortho deems necessary; ESR elevated  75 and CRP elevated 11.4  Decompensated hepatic cirrhosis ([place]) - new diagnosis; RUQ u/s on 9/14 shows nodular liver consistent with diagnosis; also some GB sludge but LFT pattern not truly consistent with obstruction  - she has had elevated INR despite coumadin washout with ongoing elevated LFTs which may have some confounding from underlying infection; ammonia also upper limit normal on 9/9 and she's responded well to lactulose - GI consulted for further assistance with evaluation - likely NAFLD; no history of etoh use that would explain - further autoimmune workup also commenced - follow up hepatitis panel as well  - holding torsemide for now; will resume possibly on lasix as renal function stabilizes after fluid challenge   Elevated INR - INR peaked at 9.9 after admission on 8/26 and she was given Vit K to help reverse  - last Coumadin dose 9/9  - see cirrhosis now as well - will give Vit K and FFP pending timing of hopeful shoulder washout  Chronic atrial fibrillation ([place]) - on Coumadin at home prior to admission - holding anticoagulation for now in setting of need for surgery - see INR workup as well  - given new diagnosis of cirrhosis, will transition her to Eliquis once resuming anticoagulation   Acute metabolic encephalopathy - Multifactorial and likely some hospital delirium contributing given extended length of stay - patient symptoms include occasional AMS - etiology considered due to toxic/metabolic - continue re-orienting as needed - treating infection - given recently elevated NH3 and now diagnosis consistent with cirrhosis, she is also started on lactulose and rifaximin   Acute renal failure superimposed on  stage 3b chronic kidney disease ([place]) - patient has history of CKD3b. Baseline creat ~ 1.4 - 1.5, eGFR 31 - 41 - has been on torsemide 40 mg BID likely contributing to overdiuresis; not consistent with a hepatorenal syndrome at this time - torsemide stopped on  9/15 - will give small fluid resuscitation today and follow up renal function in am  Cellulitis of left lower extremity-resolved as of 07/21/2021 - mild erythema on admission from media pic; now treated and resolved   History of pacemaker - stable; at risk for infection seeding without source control   Hypothyroidism - Continue Synthroid  GERD (gastroesophageal reflux disease) - Continue Protonix  Morbid obesity ([place]) - Complicates overall prognosis and care - Body mass index is 43.36 kg/m. - Weight Loss and Dietary Counseling given  Anemia of chronic disease - Baseline hemoglobin 9 to 10 g/dL -Currently at baseline and no signs of bleeding  Malignant neoplasm of upper-outer quadrant of left breast in female, estrogen receptor positive ([place]) - outpatient follow up as indicated   Lymphedema - Stable edema.  No further signs of cellulitis involving lower extremities  Essential hypertension - Continue Coreg  Acute respiratory failure with hypoxia ([facility])-resolved as of 07/21/2021 secondary to fluid overload,  acute on chronic systolic CHF, hypercapnia likely from OSA. Respiratory status much improved.  Patient had adequate diuresis with negative fluid balance. Continue supplemental oxygen to keep saturation above 94%. Diuretics changed to torsemide  - now on RA   Old records reviewed in assessment of this patient  Antimicrobials:   DVT prophylaxis: SCDs Start: 07/02/21 0948   Code Status:   Code Status: Full Code Family Communication: son, Katelyn Jackson   Disposition Plan: Status is: Inpatient  Remains inpatient appropriate because:Ongoing diagnostic testing needed not appropriate for outpatient work up, IV treatments appropriate due to intensity of illness or inability to take PO, and Inpatient level of care appropriate due to severity of illness  Dispo: The patient is from: Home              Anticipated d/c is to:  pending clinical course              Patient currently  is not medically stable to d/c.   Difficult to place patient No  Risk of unplanned readmission score: Unplanned Admission- Pilot do not use: 35.05   Objective: Blood pressure 129/63, pulse (!) 59, temperature 98.5 F (36.9 C), temperature source Oral, resp. rate 20, height 5' 1"  (1.549 m), weight 105 kg, SpO2 93 %.  Examination: General appearance: alert, cooperative, no distress, and morbidly obese Head: Normocephalic, without obvious abnormality, atraumatic Eyes:  EOMI Lungs: clear to auscultation bilaterally Heart: irregularly irregular rhythm and S1, S2 normal Abdomen:  obese, soft, NT, BS present Extremities:  right shoulder noted with fluctuant lesion; pain with passive ROM in shoulder; no erythema or edema noted. LE's show chronic lymphedema without erythema or signs of infection Skin: mobility and turgor normal Neurologic: Grossly normal  Consultants:  ID Ortho surgery GI  Procedures:    Data Reviewed: I have personally reviewed following labs and imaging studies Results for orders placed or performed during the hospital encounter of 07/01/21 (from the past 24 hour(s))  Protime-INR     Status: Abnormal   Collection Time: 07/22/21  5:01 AM  Result Value Ref Range   Prothrombin Time 30.7 (H) 11.4 - 15.2 seconds   INR 2.9 (H) 0.8 - 1.2  Basic metabolic panel     Status: Abnormal  Collection Time: 07/22/21  5:01 AM  Result Value Ref Range   Sodium 133 (L) 135 - 145 mmol/L   Potassium 3.7 3.5 - 5.1 mmol/L   Chloride 90 (L) 98 - 111 mmol/L   CO2 33 (H) 22 - 32 mmol/L   Glucose, Bld 103 (H) 70 - 99 mg/dL   BUN 42 (H) 8 - 23 mg/dL   Creatinine, Ser 2.24 (H) 0.44 - 1.00 mg/dL   Calcium 10.2 8.9 - 10.3 mg/dL   GFR, Estimated 22 (L) >60 mL/min   Anion gap 10 5 - 15  CBC with Differential/Platelet     Status: Abnormal   Collection Time: 07/22/21  5:01 AM  Result Value Ref Range   WBC 6.2 4.0 - 10.5 K/uL   RBC 3.40 (L) 3.87 - 5.11 MIL/uL   Hemoglobin 10.2 (L) 12.0 -  15.0 g/dL   HCT 32.2 (L) 36.0 - 46.0 %   MCV 94.7 80.0 - 100.0 fL   MCH 30.0 26.0 - 34.0 pg   MCHC 31.7 30.0 - 36.0 g/dL   RDW 16.2 (H) 11.5 - 15.5 %   Platelets 259 150 - 400 K/uL   nRBC 0.0 0.0 - 0.2 %   Neutrophils Relative % 65 %   Neutro Abs 4.0 1.7 - 7.7 K/uL   Lymphocytes Relative 17 %   Lymphs Abs 1.1 0.7 - 4.0 K/uL   Monocytes Relative 11 %   Monocytes Absolute 0.7 0.1 - 1.0 K/uL   Eosinophils Relative 6 %   Eosinophils Absolute 0.4 0.0 - 0.5 K/uL   Basophils Relative 1 %   Basophils Absolute 0.1 0.0 - 0.1 K/uL   Immature Granulocytes 0 %   Abs Immature Granulocytes 0.02 0.00 - 0.07 K/uL  Magnesium     Status: Abnormal   Collection Time: 07/22/21  5:01 AM  Result Value Ref Range   Magnesium 2.8 (H) 1.7 - 2.4 mg/dL  Hepatic function panel     Status: Abnormal   Collection Time: 07/22/21  5:01 AM  Result Value Ref Range   Total Protein 6.9 6.5 - 8.1 g/dL   Albumin 2.5 (L) 3.5 - 5.0 g/dL   AST 22 15 - 41 U/L   ALT 14 0 - 44 U/L   Alkaline Phosphatase 320 (H) 38 - 126 U/L   Total Bilirubin 2.0 (H) 0.3 - 1.2 mg/dL   Bilirubin, Direct 0.9 (H) 0.0 - 0.2 mg/dL   Indirect Bilirubin 1.1 (H) 0.3 - 0.9 mg/dL    Recent Results (from the past 240 hour(s))  Resp Panel by RT-PCR (Flu A&B, Covid) Nasopharyngeal Swab     Status: None   Collection Time: 07/13/21  6:05 PM   Specimen: Nasopharyngeal Swab; Nasopharyngeal(NP) swabs in vial transport medium  Result Value Ref Range Status   SARS Coronavirus 2 by RT PCR NEGATIVE NEGATIVE Final    Comment: (NOTE) SARS-CoV-2 target nucleic acids are NOT DETECTED.  The SARS-CoV-2 RNA is generally detectable in upper respiratory specimens during the acute phase of infection. The lowest concentration of SARS-CoV-2 viral copies this assay can detect is 138 copies/mL. A negative result does not preclude SARS-Cov-2 infection and should not be used as the sole basis for treatment or other patient management decisions. A negative result may  occur with  improper specimen collection/handling, submission of specimen other than nasopharyngeal swab, presence of viral mutation(s) within the areas targeted by this assay, and inadequate number of viral copies(<138 copies/mL). A negative result must be combined with clinical observations,  patient history, and epidemiological information. The expected result is Negative.  Fact Sheet for Patients:  EntrepreneurPulse.com.au  Fact Sheet for Healthcare Providers:  IncredibleEmployment.be  This test is no t yet approved or cleared by the [place] FDA and  has been authorized for detection and/or diagnosis of SARS-CoV-2 by FDA under an Emergency Use Authorization (EUA). This EUA will remain  in effect (meaning this test can be used) for the duration of the COVID-19 declaration under Section 564(b)(1) of the Act, 21 U.S.C.section 360bbb-3(b)(1), unless the authorization is terminated  or revoked sooner.       Influenza A by PCR NEGATIVE NEGATIVE Final   Influenza B by PCR NEGATIVE NEGATIVE Final    Comment: (NOTE) The Xpert Xpress SARS-CoV-2/FLU/RSV plus assay is intended as an aid in the diagnosis of influenza from Nasopharyngeal swab specimens and should not be used as a sole basis for treatment. Nasal washings and aspirates are unacceptable for Xpert Xpress SARS-CoV-2/FLU/RSV testing.  Fact Sheet for Patients: EntrepreneurPulse.com.au  Fact Sheet for Healthcare Providers: IncredibleEmployment.be  This test is not yet approved or cleared by the [place] FDA and has been authorized for detection and/or diagnosis of SARS-CoV-2 by FDA under an Emergency Use Authorization (EUA). This EUA will remain in effect (meaning this test can be used) for the duration of the COVID-19 declaration under Section 564(b)(1) of the Act, 21 U.S.C. section 360bbb-3(b)(1), unless the authorization is terminated  or revoked.  Performed at [facility] [place] [address] [place], [place] [address]   Aerobic/Anaerobic Culture w Gram Stain (surgical/deep wound)     Status: None   Collection Time: 07/16/21  4:20 PM   Specimen: Abscess  Result Value Ref Range Status   Specimen Description   Final    ABSCESS RIGHT ARM Performed at [place] [address], [place], [place] [address]    Special Requests   Final    NONE Performed at [facility], [place] [address], [place], [place] [address]    Gram Stain   Final    ABUNDANT WBC PRESENT,BOTH PMN AND MONONUCLEAR NO ORGANISMS SEEN    Culture   Final    No growth aerobically or anaerobically. Performed at [facility], [place] [address], [place], [place] [address]    Report Status 07/21/2021 FINAL  Final  Aerobic/Anaerobic Culture w Gram Stain (surgical/deep wound)     Status: None   Collection Time: 07/16/21  4:25 PM   Specimen: Joint, Right Shoulder; Synovial Fluid  Result Value Ref Range Status   Specimen Description   Final    JOINT FLUID RIGHT SHOULDER Performed at [place] [address], [place], [place] [address]    Special Requests   Final    NONE Performed at [facility], [place] [address], [place], [place] [address]    Gram Stain   Final    FEW WBC PRESENT, PREDOMINANTLY PMN NO ORGANISMS SEEN    Culture   Final    No growth aerobically or anaerobically. Performed at [facility], [place] [address] [place], [place] [address]    Report Status 07/21/2021 FINAL  Final  Resp Panel by RT-PCR (Flu A&B, Covid) Nasopharyngeal Swab     Status: None   Collection Time: 07/17/21 11:13 AM   Specimen: Nasopharyngeal Swab; Nasopharyngeal(NP) swabs in vial transport medium  Result Value Ref Range Status   SARS Coronavirus 2 by RT PCR NEGATIVE NEGATIVE Final  Comment: (NOTE) SARS-CoV-2 target nucleic acids are NOT DETECTED.  The  SARS-CoV-2 RNA is generally detectable in upper respiratory specimens during the acute phase of infection. The lowest concentration of SARS-CoV-2 viral copies this assay can detect is 138 copies/mL. A negative result does not preclude SARS-Cov-2 infection and should not be used as the sole basis for treatment or other patient management decisions. A negative result may occur with  improper specimen collection/handling, submission of specimen other than nasopharyngeal swab, presence of viral mutation(s) within the areas targeted by this assay, and inadequate number of viral copies(<138 copies/mL). A negative result must be combined with clinical observations, patient history, and epidemiological information. The expected result is Negative.  Fact Sheet for Patients:  EntrepreneurPulse.com.au  Fact Sheet for Healthcare Providers:  IncredibleEmployment.be  This test is no t yet approved or cleared by the [place] FDA and  has been authorized for detection and/or diagnosis of SARS-CoV-2 by FDA under an Emergency Use Authorization (EUA). This EUA will remain  in effect (meaning this test can be used) for the duration of the COVID-19 declaration under Section 564(b)(1) of the Act, 21 U.S.C.section 360bbb-3(b)(1), unless the authorization is terminated  or revoked sooner.       Influenza A by PCR NEGATIVE NEGATIVE Final   Influenza B by PCR NEGATIVE NEGATIVE Final    Comment: (NOTE) The Xpert Xpress SARS-CoV-2/FLU/RSV plus assay is intended as an aid in the diagnosis of influenza from Nasopharyngeal swab specimens and should not be used as a sole basis for treatment. Nasal washings and aspirates are unacceptable for Xpert Xpress SARS-CoV-2/FLU/RSV testing.  Fact Sheet for Patients: EntrepreneurPulse.com.au  Fact Sheet for Healthcare Providers: IncredibleEmployment.be  This test is not yet approved or  cleared by the [place] FDA and has been authorized for detection and/or diagnosis of SARS-CoV-2 by FDA under an Emergency Use Authorization (EUA). This EUA will remain in effect (meaning this test can be used) for the duration of the COVID-19 declaration under Section 564(b)(1) of the Act, 21 U.S.C. section 360bbb-3(b)(1), unless the authorization is terminated or revoked.  Performed at [facility], [place] [address] [place], [place] [address]      Radiology Studies: US Abdomen Limited RUQ (LIVER/GB)  Result Date: 07/21/2021 CLINICAL DATA:  Elevated INR, obesity EXAM: ULTRASOUND ABDOMEN LIMITED RIGHT UPPER QUADRANT COMPARISON:  01/05/2021 FINDINGS: Gallbladder: Echogenic material within the gallbladder compatible with sludge. No shadowing gallstones. No gallbladder wall thickening or pericholecystic fluid. Negative Murphy sign. Common bile duct: Diameter: 7 mm Liver: Coarsened liver echotexture with nodularity of the liver capsule compatible with cirrhosis. No focal parenchymal abnormalities. No intrahepatic duct dilation. Portal vein is patent on color Doppler imaging with normal direction of blood flow towards the liver. Other: None. IMPRESSION: 1. Gallbladder sludge. No evidence of cholelithiasis or cholecystitis. 2. Sonographic findings of cirrhosis. Electronically Signed   By: Randa Ngo M.D.   On: 07/21/2021 19:54   US Abdomen Limited RUQ (LIVER/GB)  Final Result    VAS [place] LOWER EXTREMITY VENOUS (DVT)  Final Result    CT SHOULDER RIGHT W CONTRAST  Final Result    IR US Guide Bx Asp/Drain  Final Result    IR DRAIN/INJ MAJOR JOINT/BURSA  Final Result    CT SHOULDER RIGHT WO CONTRAST  Final Result    DG Shoulder Right  Final Result    VAS [place] UPPER EXTREMITY VENOUS DUPLEX  Final Result    CT LUMBAR SPINE WO CONTRAST  Final Result  CT PELVIS WO CONTRAST  Final Result    CT KNEE LEFT WO CONTRAST  Final Result    CT KNEE RIGHT WO CONTRAST   Final Result    DG Ankle 2 Views Left  Final Result    CT HEAD WO CONTRAST (5MM)  Final Result    DG Chest 2 View  Final Result    DG Knee Complete 4 Views Right  Final Result    DG Knee Complete 4 Views Left  Final Result      Scheduled Meds:  carvedilol  6.25 mg Oral BID WC   cholecalciferol  2,000 Units Oral Daily   exemestane  25 mg Oral QPC breakfast   lactulose  10 g Oral BID   levothyroxine  50 mcg Oral QAC breakfast   pantoprazole  40 mg Oral Daily   phytonadione  10 mg Oral Once   potassium chloride  20 mEq Oral Daily   pravastatin  20 mg Oral q1800   rifaximin  550 mg Oral BID   vitamin B-12  2,000 mcg Oral Daily   PRN Meds: sodium chloride, acetaminophen **OR** acetaminophen, diclofenac Sodium, lipase/protease/amylase, ondansetron **OR** ondansetron (ZOFRAN) IV, oxyCODONE, polyvinyl alcohol, simethicone, traMADol, witch hazel-glycerin Continuous Infusions:  sodium chloride 250 mL (07/16/21 1319)   sodium chloride       LOS: 20 days  Time spent: Greater than 50% of the 35 minute visit was spent in counseling/coordination of care for the patient as laid out in the A&P.   Dwyane Dee, MD [facility] 07/22/2021, 2:10 PM

## 2021-07-22 NOTE — Progress Notes (Signed)
Subjective:  She dislikes having blood drawn so many times     Antibiotics:  Anti-infectives (From admission, onward)    Start     Dose/Rate Route Frequency Ordered Stop   07/22/21 1100  rifaximin (XIFAXAN) tablet 550 mg        550 mg Oral 2 times daily 07/22/21 1005     07/16/21 1200  vancomycin (VANCOREADY) IVPB 750 mg/150 mL  Status:  Discontinued        750 mg 150 mL/hr over 60 Minutes Intravenous Every 24 hours 07/15/21 1242 07/19/21 1537   07/15/21 1400  vancomycin (VANCOREADY) IVPB 2000 mg/400 mL        2,000 mg 200 mL/hr over 120 Minutes Intravenous  Once 07/15/21 1242 07/15/21 1522   07/10/21 1530  doxycycline (VIBRA-TABS) tablet 100 mg  Status:  Discontinued        100 mg Oral Every 12 hours 07/10/21 1437 07/15/21 1127   07/05/21 1245  cefadroxil (DURICEF) capsule 500 mg        500 mg Oral 2 times daily 07/05/21 1155 07/07/21 2217   07/03/21 0900  cefTRIAXone (ROCEPHIN) 2 g in sodium chloride 0.9 % 100 mL IVPB  Status:  Discontinued        2 g 200 mL/hr over 30 Minutes Intravenous Every 24 hours 07/02/21 0950 07/05/21 1155   07/02/21 0915  cefTRIAXone (ROCEPHIN) 2 g in sodium chloride 0.9 % 100 mL IVPB        2 g 200 mL/hr over 30 Minutes Intravenous  Once 07/02/21 0909 07/02/21 1033       Medications: Scheduled Meds:  carvedilol  6.25 mg Oral BID WC   cholecalciferol  2,000 Units Oral Daily   exemestane  25 mg Oral QPC breakfast   lactulose  10 g Oral BID   levothyroxine  50 mcg Oral QAC breakfast   pantoprazole  40 mg Oral Daily   phytonadione  10 mg Oral Once   potassium chloride  20 mEq Oral Daily   pravastatin  20 mg Oral q1800   rifaximin  550 mg Oral BID   vitamin B-12  2,000 mcg Oral Daily   Continuous Infusions:  sodium chloride 250 mL (07/16/21 1319)   sodium chloride     PRN Meds:.sodium chloride, acetaminophen **OR** acetaminophen, diclofenac Sodium, lipase/protease/amylase, ondansetron **OR** ondansetron (ZOFRAN) IV, oxyCODONE,  polyvinyl alcohol, simethicone, traMADol, witch hazel-glycerin    Objective: Weight change: 0.9 kg  Intake/Output Summary (Last 24 hours) at 07/22/2021 1601 Last data filed at 07/22/2021 1340 Gross per 24 hour  Intake 480 ml  Output 350 ml  Net 130 ml    Blood pressure 129/63, pulse (!) 59, temperature 98.5 F (36.9 C), temperature source Oral, resp. rate 20, height 5\' 1"  (1.549 m), weight 105 kg, SpO2 93 %. Temp:  [98.1 F (36.7 C)-98.6 F (37 C)] 98.5 F (36.9 C) (09/15 1300) Pulse Rate:  [58-68] 59 (09/15 1300) Resp:  [17-20] 20 (09/15 1300) BP: (119-150)/(58-82) 129/63 (09/15 1300) SpO2:  [93 %-95 %] 93 % (09/15 1300) Weight:  [105 kg] 105 kg (09/15 0446)  Physical Exam: Physical Exam Constitutional:      General: She is not in acute distress.    Appearance: She is well-developed. She is not diaphoretic.  HENT:     Head: Normocephalic and atraumatic.     Right Ear: External ear normal.     Left Ear: External ear normal.     Mouth/Throat:  Pharynx: No oropharyngeal exudate.  Eyes:     General: No scleral icterus.    Extraocular Movements: Extraocular movements intact.     Conjunctiva/sclera: Conjunctivae normal.     Pupils: Pupils are equal, round, and reactive to light.  Cardiovascular:     Rate and Rhythm: Normal rate and regular rhythm.  Pulmonary:     Effort: Pulmonary effort is normal. No respiratory distress.     Breath sounds: Normal breath sounds. No wheezing.  Abdominal:     General: Bowel sounds are normal. There is no distension.     Palpations: Abdomen is soft.     Tenderness: There is no abdominal tenderness. There is no rebound.  Lymphadenopathy:     Cervical: No cervical adenopathy.  Skin:    General: Skin is warm and dry.     Coloration: Skin is pale.     Findings: No erythema.  Neurological:     General: No focal deficit present.     Mental Status: She is alert and oriented to person, place, and time.     Motor: No abnormal muscle  tone.  Psychiatric:        Attention and Perception: Attention normal.        Mood and Affect: Mood is anxious.        Behavior: Behavior normal.        Thought Content: Thought content normal.        Judgment: Judgment normal.     Skin lesion on right shoulder:      CBC:    BMET Recent Labs    07/21/21 0413 07/22/21 0501  NA 132* 133*  K 3.5 3.7  CL 90* 90*  CO2 31 33*  GLUCOSE 94 103*  BUN 38* 42*  CREATININE 1.78* 2.24*  CALCIUM 10.0 10.2      Liver Panel  Recent Labs    07/21/21 0413 07/22/21 0501  PROT 6.5 6.9  ALBUMIN 2.5* 2.5*  AST 21 22  ALT 17 14  ALKPHOS 326* 320*  BILITOT 2.2* 2.0*  BILIDIR 1.0* 0.9*  IBILI 1.2* 1.1*        Sedimentation Rate Recent Labs    07/21/21 0413  ESRSEDRATE 75*    C-Reactive Protein Recent Labs    07/21/21 0413  CRP 11.4*     Micro Results: Recent Results (from the past 720 hour(s))  Urine Culture     Status: Abnormal   Collection Time: 07/01/21  2:39 PM   Specimen: Urine, Clean Catch  Result Value Ref Range Status   Specimen Description   Final    URINE, CLEAN CATCH Performed at [facility], [place] [address], [place], [place] [address]    Special Requests   Final    NONE Performed at [facility], [place] [address], [place],  [address]    Culture MULTIPLE SPECIES PRESENT, SUGGEST RECOLLECTION (A)  Final   Report Status 07/02/2021 FINAL  Final  SARS CORONAVIRUS 2 (TAT 6-24 HRS) Nasopharyngeal Nasopharyngeal Swab     Status: None   Collection Time: 07/02/21 11:19 AM   Specimen: Nasopharyngeal Swab  Result Value Ref Range Status   SARS Coronavirus 2 NEGATIVE NEGATIVE Final    Comment: (NOTE) SARS-CoV-2 target nucleic acids are NOT DETECTED.  The SARS-CoV-2 RNA is generally detectable in upper and lower respiratory specimens during the acute phase of infection. Negative results do not preclude SARS-CoV-2 infection, do not rule out co-infections  with other pathogens, and should not be used  as the sole basis for treatment or other patient management decisions. Negative results must be combined with clinical observations, patient history, and epidemiological information. The expected result is Negative.  Fact Sheet for Patients: SugarRoll.be  Fact Sheet for Healthcare Providers: https://www.woods-mathews.com/  This test is not yet approved or cleared by the [place] FDA and  has been authorized for detection and/or diagnosis of SARS-CoV-2 by FDA under an Emergency Use Authorization (EUA). This EUA will remain  in effect (meaning this test can be used) for the duration of the COVID-19 declaration under Se ction 564(b)(1) of the Act, 21 U.S.C. section 360bbb-3(b)(1), unless the authorization is terminated or revoked sooner.  Performed at [facility], [place] [address], [place], [place] [address]   Resp Panel by RT-PCR (Flu A&B, Covid) Nasopharyngeal Swab     Status: None   Collection Time: 07/13/21  6:05 PM   Specimen: Nasopharyngeal Swab; Nasopharyngeal(NP) swabs in vial transport medium  Result Value Ref Range Status   SARS Coronavirus 2 by RT PCR NEGATIVE NEGATIVE Final    Comment: (NOTE) SARS-CoV-2 target nucleic acids are NOT DETECTED.  The SARS-CoV-2 RNA is generally detectable in upper respiratory specimens during the acute phase of infection. The lowest concentration of SARS-CoV-2 viral copies this assay can detect is 138 copies/mL. A negative result does not preclude SARS-Cov-2 infection and should not be used as the sole basis for treatment or other patient management decisions. A negative result may occur with  improper specimen collection/handling, submission of specimen other than nasopharyngeal swab, presence of viral mutation(s) within the areas targeted by this assay, and inadequate number of viral copies(<138 copies/mL). A negative result must be combined  with clinical observations, patient history, and epidemiological information. The expected result is Negative.  Fact Sheet for Patients:  EntrepreneurPulse.com.au  Fact Sheet for Healthcare Providers:  IncredibleEmployment.be  This test is no t yet approved or cleared by the [place] FDA and  has been authorized for detection and/or diagnosis of SARS-CoV-2 by FDA under an Emergency Use Authorization (EUA). This EUA will remain  in effect (meaning this test can be used) for the duration of the COVID-19 declaration under Section 564(b)(1) of the Act, 21 U.S.C.section 360bbb-3(b)(1), unless the authorization is terminated  or revoked sooner.       Influenza A by PCR NEGATIVE NEGATIVE Final   Influenza B by PCR NEGATIVE NEGATIVE Final    Comment: (NOTE) The Xpert Xpress SARS-CoV-2/FLU/RSV plus assay is intended as an aid in the diagnosis of influenza from Nasopharyngeal swab specimens and should not be used as a sole basis for treatment. Nasal washings and aspirates are unacceptable for Xpert Xpress SARS-CoV-2/FLU/RSV testing.  Fact Sheet for Patients: EntrepreneurPulse.com.au  Fact Sheet for Healthcare Providers: IncredibleEmployment.be  This test is not yet approved or cleared by the [place] FDA and has been authorized for detection and/or diagnosis of SARS-CoV-2 by FDA under an Emergency Use Authorization (EUA). This EUA will remain in effect (meaning this test can be used) for the duration of the COVID-19 declaration under Section 564(b)(1) of the Act, 21 U.S.C. section 360bbb-3(b)(1), unless the authorization is terminated or revoked.  Performed at [facility], [place] [address], [place], [place] [address]   Aerobic/Anaerobic Culture w Gram Stain (surgical/deep wound)     Status: None   Collection Time: 07/16/21  4:20 PM   Specimen: Abscess  Result Value Ref Range Status    Specimen Description   Final    ABSCESS RIGHT  ARM Performed at [facility], [place] [address], [place], [place] [address]    Special Requests   Final    NONE Performed at [facility], [place] [address], [place], [place] [address]    Gram Stain   Final    ABUNDANT WBC PRESENT,BOTH PMN AND MONONUCLEAR NO ORGANISMS SEEN    Culture   Final    No growth aerobically or anaerobically. Performed at [facility], [place] [address], [place], [place] [address]    Report Status 07/21/2021 FINAL  Final  Aerobic/Anaerobic Culture w Gram Stain (surgical/deep wound)     Status: None   Collection Time: 07/16/21  4:25 PM   Specimen: Joint, Right Shoulder; Synovial Fluid  Result Value Ref Range Status   Specimen Description   Final    JOINT FLUID RIGHT SHOULDER Performed at [place] [address] [place], [place] [address]    Special Requests   Final    NONE Performed at [facility], [place] [address] [place], [place] [address]    Gram Stain   Final    FEW WBC PRESENT, PREDOMINANTLY PMN NO ORGANISMS SEEN    Culture   Final    No growth aerobically or anaerobically. Performed at [facility], [place] [address] [place], [place] [address]    Report Status 07/21/2021 FINAL  Final  Resp Panel by RT-PCR (Flu A&B, Covid) Nasopharyngeal Swab     Status: None   Collection Time: 07/17/21 11:13 AM   Specimen: Nasopharyngeal Swab; Nasopharyngeal(NP) swabs in vial transport medium  Result Value Ref Range Status   SARS Coronavirus 2 by RT PCR NEGATIVE NEGATIVE Final    Comment: (NOTE) SARS-CoV-2 target nucleic acids are NOT DETECTED.  The SARS-CoV-2 RNA is generally detectable in upper respiratory specimens during the acute phase of infection. The lowest concentration of SARS-CoV-2 viral copies this assay can detect is 138 copies/mL. A negative result does not preclude SARS-Cov-2 infection and should not be used  as the sole basis for treatment or other patient management decisions. A negative result may occur with  improper specimen collection/handling, submission of specimen other than nasopharyngeal swab, presence of viral mutation(s) within the areas targeted by this assay, and inadequate number of viral copies(<138 copies/mL). A negative result must be combined with clinical observations, patient history, and epidemiological information. The expected result is Negative.  Fact Sheet for Patients:  EntrepreneurPulse.com.au  Fact Sheet for Healthcare Providers:  IncredibleEmployment.be  This test is no t yet approved or cleared by the [place] FDA and  has been authorized for detection and/or diagnosis of SARS-CoV-2 by FDA under an Emergency Use Authorization (EUA). This EUA will remain  in effect (meaning this test can be used) for the duration of the COVID-19 declaration under Section 564(b)(1) of the Act, 21 U.S.C.section 360bbb-3(b)(1), unless the authorization is terminated  or revoked sooner.       Influenza A by PCR NEGATIVE NEGATIVE Final   Influenza B by PCR NEGATIVE NEGATIVE Final    Comment: (NOTE) The Xpert Xpress SARS-CoV-2/FLU/RSV plus assay is intended as an aid in the diagnosis of influenza from Nasopharyngeal swab specimens and should not be used as a sole basis for treatment. Nasal washings and aspirates are unacceptable for Xpert Xpress SARS-CoV-2/FLU/RSV testing.  Fact Sheet for Patients: EntrepreneurPulse.com.au  Fact Sheet for Healthcare Providers: IncredibleEmployment.be  This test is not yet approved or cleared by the [place] and has been authorized  for detection and/or diagnosis of SARS-CoV-2 by FDA under an Emergency Use Authorization (EUA). This EUA will remain in effect (meaning this test can be used) for the duration of the COVID-19 declaration under Section 564(b)(1)  of the Act, 21 U.S.C. section 360bbb-3(b)(1), unless the authorization is terminated or revoked.  Performed at [facility], [place] [address], [place], [place] [address]     Studies/Results: US Abdomen Limited RUQ (LIVER/GB)  Result Date: 07/21/2021 CLINICAL DATA:  Elevated INR, obesity EXAM: ULTRASOUND ABDOMEN LIMITED RIGHT UPPER QUADRANT COMPARISON:  01/05/2021 FINDINGS: Gallbladder: Echogenic material within the gallbladder compatible with sludge. No shadowing gallstones. No gallbladder wall thickening or pericholecystic fluid. Negative Murphy sign. Common bile duct: Diameter: 7 mm Liver: Coarsened liver echotexture with nodularity of the liver capsule compatible with cirrhosis. No focal parenchymal abnormalities. No intrahepatic duct dilation. Portal vein is patent on color Doppler imaging with normal direction of blood flow towards the liver. Other: None. IMPRESSION: 1. Gallbladder sludge. No evidence of cholelithiasis or cholecystitis. 2. Sonographic findings of cirrhosis. Electronically Signed   By: Randa Ngo M.D.   On: 07/21/2021 19:54      Assessment/Plan:  INTERVAL HISTORY: INR still at 5.1   Principal Problem:   Abscess of right shoulder Active Problems:   Essential hypertension   Chronic atrial fibrillation (HCC)   Lymphedema   Malignant neoplasm of upper-outer quadrant of left breast in female, estrogen receptor positive (HCC)   Anemia of chronic disease   Acute renal failure superimposed on stage 3b chronic kidney disease (HCC)   Morbid obesity (HCC)   GERD (gastroesophageal reflux disease)   Bilateral pain of leg and foot   Hypothyroidism   Elevated INR   Pressure injury of skin   History of pacemaker   Acute metabolic encephalopathy   Decompensated hepatic cirrhosis (HCC)    Nejla Reasor is a 75 y.o. female with [place] medical problems including atrial fibrillation on Coumadin, morbid obesity chronic lymphedema admitted with  systemic infections concerning for infection and with significant fluid above and in the right shoulder where she has had placement of prosthetic shoulder in May 2022, also with pacemaker that was placed this year.  #1  Prosthetic joint infection:  Continue to hold antibiotics  Continue to hold anticoagulation and reverse if necessary.  She will need I&D in the operating room where cultures can be obtained from deep sites and she could have potentially resection arthroplasty though would  defer to Dr. Griffin Basil what can or cannot be done with prosthetic material.   She also has a fractures in this area .   #2  "Cellulitis "I doubt she ever really had cellulitis and certainly if she did it is long since resolved  #3 atrial fibrillation would not have her on Coumadin in the future but use a different anticoagulant  #4  Cirrhosis: No history of alcohol intake I suspect it is NASH   I spent 37 minutes with the patient including  face to face counseling of the patient and son with iPad [name] translation service regarding my concerns for her septic shoulder, problems regarding her elevated INR differential for her cirrhosis reason why her frequent blood sticks are happening, personally reviewing her US liver, CBC CMP, updated cultures,  review of medical records in preparation for the visit and during the visit and in coordination of her care.    LOS: 20 days   Alcide Evener 07/22/2021, 4:01 PM

## 2021-07-22 NOTE — Progress Notes (Signed)
ORTHOPAEDIC PROGRESS NOTE  SUBJECTIVE: Continues to say her shoulder feels "better." Still at a pain level of 5/10 for her right shoulder. States left leg is a 7 today. Afebrile.   OBJECTIVE: PE: General: resting in hospital bed, NAD, fatigued Right shoulder: bullae is still present, but surrounding erythema has improved.  Continues to have pain with range of motion, but reports this is better. Tolerates about 60 degrees passive forward elevation.Limited active forward elevation. Bilateral lower extremity: chronic lymphedema. Actively moves ankle and foot today without much pain. Endorses distal sensation. Warm well perfused feet.   Vitals:   07/22/21 0447 07/22/21 0839  BP: (!) 119/58   Pulse: (!) 58 68  Resp: 19   Temp: 98.6 F (37 C)   SpO2: 95%    BRIEF HISTORY: Patient has a complex right shoulder. Her shoulder had atypical degenerative changes with a scapular fracture that appeared chronic and essentially complete loss of the glenoid vault leaving the remnant of the scapula that was not unreconstructable.  Due to the atypical nature of her proximal humerus we felt that the differential diagnosis included infection versus cancer versus less likely a occult fracture that is gone on to malunion and AVN over time. She underwent a right shoulder hemiarthroplasty in May 2022 to try and improve her function. She did well after surgery until recently. She had a few falls and was admitted to the hospital on August 26th. At that time, she was complaining more of lower leg pain. All imaging was negative for occult fracture. Imaging of her right shoulder continued to show a chronic dislocation. She began to complain of more right shoulder pain. It was noted that she had a possible abscess on the distal aspect of her incision, which was not noted before her admission. This began to increase in size. Patient had ultrasound guided aspiration of right upper arm abscess and fluoro guided aspiration of  right shoulder joint  on 07/16/2021 with IR. Was found to have purulent debris superficially and only a few mL of bloody fluid in the joint, per IR note. Cultures so far have been negative.  She was found to be febrile even while on Vancomycin. Infectious Disease was consulted by primary team and is following. Her INR was continuing to rise despite being off Warfarin.   ASSESSMENT: Katelyn Jackson is a 75 y.o. female  - s/p right shoulder hemiarthroplasty for unreconstructable shoulder in May 2022 - right shoulder abscess s/p aspiration on 07/16/2021 with IR - bilateral knee osteoarthritis - chronic bilateral leg lymphedema   PLAN: - Right shoulder: Patient's shoulder chronically appears dislocated due to her unreconstructable shoulder from previous right humerus and scapula fractures with end-stage degenerative changes in the joint. She had a right hemiarthroplasty in May 2022.   - s/p aspiration of superficial abscess and joint on 07/16/2021 with Interventional Radiology  - Cultures showed no growth aerobically or anaerobically - CT w contrast has been ordered by ID: anterior fluid collection has decreased in size, joint effusion unchanged in volume  Dr. Tommy Medal and Dr. Griffin Basil have discussed this case. Plan is to discontinue antibiotics. She will need to stay off Warfarin in hopes that her INR comes down. INR is currently 5.1, which is too high to safely proceed with any sort of surgical intervention. If she remains symptomatic and INR comes down, then we will proceed with surgical intervention - I&D and possible resection of prosthesis. If she improves clinically and is asymptomatic, no further treatment will be  necessary.   INR has gone down today, currently 2.9. Patient being worked up by GI for newly diagnosed cirrhosis. Renal function is declining.   Patient continues to report her shoulder feels better, but has pain with range of motion. Abscess/bullae looks better in appearance with less  surrounding erythema. She has been afebrile since 9/12 and is off antibiotics. We will continue to monitor closely.   Katelyn Chapel, PA-C 07/22/2021

## 2021-07-22 NOTE — Consult Note (Addendum)
Referring Provider: Dr. Dwyane Dee Primary Care Physician:  [name] [name], MD Primary Gastroenterologist:  Althia Forts  Reason for Consultation:  Newly diagnosed cirrhosis with supratheraputic INR   HPI: Katelyn Jackson is a 75 y.o.obese female with history of hypertension, hyperlipidemia, pacemaker, history of breast cancer, chronic bilateral lower extremity lymphedema, recurrent falls, pulmonary hypertension and permanent atrial fibrillation on Coumadin presents for supratherapeutic INR and newly diagnosed cirrhosis. Patient is originally from [place], living with her son, Katelyn Jackson,for several decades.  Primarily he translates.  [employer] interpreter was used, states can call her son Katelyn Jackson on the telephone to discuss further.  Patient had right shoulder hemiarthroplasty May 2022.  Admitted to the hospital for possible right shoulder infection.  Infectious disease following. Patient currently day 20.  During hospital course continuing supratherapeutic INR, Coumadin ceased 09/09.  INR decreased from 5.1 on 07/21/2021 to 2.9 07/22/2021, she has received vitamin K 1 dose on 07/21/2021. Patient had abdominal ultrasound 07/21/2021 that showed gallbladder sludge without evidence of cholecystitis and new findings for cirrhosis. Total bilirubin 2.2. AST 21, ALT 17, Alk phos is elevated at 326.  Hemoglobin 10.2, creatinine 2.24 with BUN 42. Baseline 1.2 to 1.5 on admission.   No leukocytosis. Patient currently on toresemide 32 mgBID  Patient denies history of drinking, drug use.  Denies family history of liver issues. Patient denies history of yellowing of skin. Patient denies abdominal pain, abdominal swelling, nausea, vomiting, dysphagia. Denies melena or blood in the stool. Patient denies any confusion, was awake and oriented x3, per notes patient did have some possible metabolic encephalopathy but appears to have resolved. Patient states the only issue that she has is her leg pain and right  shoulder.   Lab Results  Component Value Date   INR 2.9 (H) 07/22/2021   INR 5.1 (HH) 07/21/2021   INR 4.9 (HH) 07/20/2021   AB [place] 07/21/2021 IMPRESSION: 1. Gallbladder sludge. No evidence of cholelithiasis or cholecystitis. 2. Sonographic findings of cirrhosis.  Past Medical History:  Diagnosis Date   Arthritis    Cancer [facility])    breast cancer   Chronic atrial fibrillation (HCC)    Chronic kidney disease    sees Dr Florene Glen   Cramps, muscle, general    Dyspnea on exertion    Dysrhythmia    a-fib,    GERD (gastroesophageal reflux disease)    Headache    Hyperlipidemia    Hypertension    Hypothyroidism    Lymphedema    Moderate to severe pulmonary hypertension (HCC)    Obesity    Personal history of radiation therapy    Pneumonia 12/2020   Presence of permanent cardiac pacemaker 01/21/2021   for bradycardia    Syncope 12/2020   needed a pacemaker    Past Surgical History:  Procedure Laterality Date   APPENDECTOMY     BREAST BIOPSY Left 2018   BREAST LUMPECTOMY Left 04/04/2017   x3   BREAST LUMPECTOMY WITH NEEDLE LOCALIZATION AND AXILLARY SENTINEL LYMPH NODE BX Left 04/04/2017   Procedure: BREAST LUMPECTOMY WITH NEEDLE LOCALIZATION x3 AND AXILLARY SENTINEL LYMPH NODE BX;  Surgeon: [name], MD;  Location: [place];  Service: General;  Laterality: Left;   CATARACT EXTRACTION W/ INTRAOCULAR LENS  IMPLANT, BILATERAL Bilateral 2018   COLONOSCOPY     EYE SURGERY     GLAUCOMA SURGERY Bilateral 2018   INSERT / REPLACE / REMOVE PACEMAKER  01/21/2021   IR US GUIDE BX ASP/DRAIN  07/16/2021   RE-EXCISION OF BREAST CANCER,SUPERIOR MARGINS Left  04/26/2017   Procedure: RE-EXCISION OF LEFT BREAST CANCER;  Surgeon: [name], MD;  Location: [place];  Service: General;  Laterality: Left;   SHOULDER HEMI-ARTHROPLASTY Right 03/17/2021   Procedure: SHOULDER HEMI-ARTHROPLASTY;  Surgeon: Hiram Gash, MD;  Location: [facility];  Service: Orthopedics;  Laterality: Right;    Prior to  Admission medications   Medication Sig Start Date End Date Taking? Authorizing Provider  allopurinol (ZYLOPRIM) 100 MG tablet Take 0.5 tablets (50 mg total) by mouth daily. Patient taking differently: Take 100 mg by mouth daily. 10/08/20  Yes Plotnikov, [name], MD  carvedilol (COREG) 25 MG tablet Take 1 tablet (25 mg total) by mouth 2 (two) times daily. 05/11/21 05/11/22 Yes Plotnikov, [name], MD  Cholecalciferol (VITAMIN D3) 2000 units capsule Take 1 capsule (2,000 Units total) by mouth daily. 07/11/17  Yes Plotnikov, [name], MD  Cyanocobalamin (VITAMIN B-12 PO) Take 2,000 mcg by mouth daily.   Yes [provider]  diclofenac Sodium (VOLTAREN) 1 % GEL APPLY FOUR GRAMS FOUR TIMES DAILY AS NEEDED Patient taking differently: Apply 4 g topically 4 (four) times daily as needed (pain). 06/11/21  Yes Plotnikov, [name], MD  exemestane (AROMASIN) 25 MG tablet Take 1 tablet (25 mg total) by mouth daily after breakfast. 10/08/20  Yes Plotnikov, [name], MD  levothyroxine (SYNTHROID) 50 MCG tablet Take 1 tablet (50 mcg total) by mouth daily before breakfast. Overdue for Annual appt must see provider for future refills 02/09/21  Yes Plotnikov, [name], MD  lipase/protease/amylase (CREON) 12000-38000 units CPEP capsule Take 1 capsule (12,000 Units total) by mouth daily as needed (stomach problems). 10/08/20  Yes Plotnikov, [name], MD  lovastatin (MEVACOR) 20 MG tablet Take 1 tablet (20 mg total) by mouth daily. 10/08/20  Yes Plotnikov, [name], MD  magnesium oxide (MAG-OX) 400 MG tablet Take 400 mg by mouth 2 (two) times daily.   Yes [provider]  methocarbamol (ROBAXIN) 500 MG tablet Take 1 tablet (500 mg total) by mouth every 8 (eight) hours as needed for muscle spasms. 03/19/21  Yes McBane, Maylene Roes, PA-C  omeprazole (PRILOSEC) 40 MG capsule Take 1 capsule (40 mg total) by mouth daily. Overdue for Annual appt must see provider for future refills Patient taking differently: Take 40  mg by mouth daily as needed (acid reflux). Overdue for Annual appt must see provider for future refills 10/08/20  Yes Plotnikov, [name], MD  OVER THE COUNTER MEDICATION Apply 1 application topically daily as needed (apply to buttocks  as needed for irritaiton). Intensive Skin Care Therapy   Yes [provider]  polyethylene glycol powder (GLYCOLAX/MIRALAX) powder mix 1 capful (17 grams) IN 8 ounces OF liquid EVERY DAY Patient taking differently: Take 17 g by mouth daily as needed for mild constipation. 11/05/18  Yes Plotnikov, [name], MD  potassium chloride (KLOR-CON) 8 MEQ tablet Take 1 tablet (8 mEq total) by mouth daily. 10/08/20  Yes Plotnikov, [name], MD  torsemide (DEMADEX) 20 MG tablet Take 60 mg every morning and 40 mg in the pm (2 pm) 05/11/21  Yes Plotnikov, [name], MD  traMADol (ULTRAM) 50 MG tablet Take 1 tablet (50 mg total) by mouth every 8 (eight) hours as needed for severe pain. 03/19/21  Yes McBane, Maylene Roes, PA-C  warfarin (COUMADIN) 5 MG tablet Take 1 tablet daily except take 1/2 tablet on Wed and Sat or Take as directed by anticoagulation clinic Patient taking differently: Take 2.5-5 mg by mouth See admin  instructions. 2.75m every day except for Tuesday's. Tuesday's take 527m Total weekly dose of 20109m12/2/21  Yes Plotnikov, AleEvie LacksD  tamoxifen (NOLVADEX) 20 MG tablet Take 1 tablet (20 mg total) by mouth daily. Patient taking differently: Take 20 mg by mouth daily. Will take after finishing Exemestane 06/10/21   FenTruitt MerleD    Scheduled Meds:  carvedilol  6.25 mg Oral BID WC   cholecalciferol  2,000 Units Oral Daily   exemestane  25 mg Oral QPC breakfast   levothyroxine  50 mcg Oral QAC breakfast   magnesium oxide  400 mg Oral BID   pantoprazole  40 mg Oral Daily   potassium chloride  20 mEq Oral Daily   pravastatin  20 mg Oral q1800   torsemide  40 mg Oral BID   vitamin B-12  2,000 mcg Oral Daily   Continuous Infusions:  sodium chloride 250 mL  (07/16/21 1319)   PRN Meds:.sodium chloride, acetaminophen **OR** acetaminophen, diclofenac Sodium, lipase/protease/amylase, ondansetron **OR** ondansetron (ZOFRAN) IV, oxyCODONE, polyethylene glycol, polyvinyl alcohol, simethicone, traMADol, witch hazel-glycerin  Allergies as of 07/01/2021 - Review Complete 07/01/2021  Allergen Reaction Noted   Chlorhexidine gluconate Hives 04/26/2017   Penicillins Itching 04/05/2016    Family History  Problem Relation Age of Onset   CAD Mother 64 39Stroke Mother 74 38    hemorr CVA   COPD Father 64 35Cancer Neg Hx     Social History   Socioeconomic History   Marital status: Widowed    Spouse name: Not on file   Number of children: 1   Years of education: Not on file   Highest education level: Not on file  Occupational History   Not on file  Tobacco Use   Smoking status: Never   Smokeless tobacco: Never  Vaping Use   Vaping Use: Never used  Substance and Sexual Activity   Alcohol use: No    Alcohol/week: 0.0 standard drinks   Drug use: No   Sexual activity: Not on file  Other Topics Concern   Not on file  Social History Narrative   Not on file   Social Determinants of Health   Financial Resource Strain: Low Risk    Difficulty of Paying Living Expenses: Not hard at all  Food Insecurity: No Food Insecurity   Worried About RunCharity fundraiser the Last Year: Never true   RanArboriculturist the Last Year: Never true  Transportation Needs: UnmPublic librarianedical): Yes   Lack of Transportation (Non-Medical): Yes  Physical Activity: Inactive   Days of Exercise per Week: 0 days   Minutes of Exercise per Session: 0 min  Stress: No Stress Concern Present   Feeling of Stress : Not at all  Social Connections: Socially Isolated   Frequency of Communication with Friends and Family: More than three times a week   Frequency of Social Gatherings with Friends and Family: Once a week   Attends  Religious Services: Never   ActMarine scientist Organizations: No   Attends CluArchivistetings: Never   Marital Status: Widowed  IntHuman resources officerolence: Not At Risk   Fear of Current or Ex-Partner: No   Emotionally Abused: No   Physically Abused: No   Sexually Abused: No    Review of Systems:  Review of Systems  Constitutional:  Negative for chills, fever and malaise/fatigue.  HENT:  Negative for hearing  loss.   Respiratory:  Negative for shortness of breath.   Cardiovascular:  Positive for leg swelling. Negative for chest pain.  Gastrointestinal:  Positive for diarrhea (has had some loose stools per nurse). Negative for abdominal pain, blood in stool, constipation, heartburn, melena, nausea and vomiting.  Musculoskeletal:  Positive for falls, joint pain and myalgias.  Skin:  Negative for itching.  Endo/Heme/Allergies:  Bruises/bleeds easily.  Psychiatric/Behavioral:  The patient is not nervous/anxious.     Physical Exam: Vital signs: Vitals:   07/21/21 1900 07/22/21 0447  BP: (!) 150/82 (!) 119/58  Pulse: 66 (!) 58  Resp: 17 19  Temp: 98.1 F (36.7 C) 98.6 F (37 C)  SpO2: 94% 95%   Last BM Date: 07/21/21 Physical Exam  Elderly appearing obese female lying comfortably in bed, lips are dry, patient has slight scleral icterus, ecchymosis bilateral hands and arms, lungs clear to auscultation bilaterally, heart with irregular rhythm, normal rate, systolic murmur.  Abdomen normal bowel sounds, no distention, tympany throughout, soft and nontender.  No spider angioma.  Patient is attentive, mood normal, speech normal, patient is oriented x3.  However she does have an intention tremor right hand, + asterixis bilateral hands.  Patient has bilateral moderate to severe lymphedema  GI:  Lab Results: Recent Labs    07/20/21 0400 07/21/21 0413 07/22/21 0501  WBC 10.3 8.5 6.2  HGB 9.7* 10.1* 10.2*  HCT 30.9* 32.7* 32.2*  PLT 215 236 259   BMET Recent Labs     07/21/21 0413 07/22/21 0501  NA 132* 133*  K 3.5 3.7  CL 90* 90*  CO2 31 33*  GLUCOSE 94 103*  BUN 38* 42*  CREATININE 1.78* 2.24*  CALCIUM 10.0 10.2   LFT Recent Labs    07/21/21 0413  PROT 6.5  ALBUMIN 2.5*  AST 21  ALT 17  ALKPHOS 326*  BILITOT 2.2*  BILIDIR 1.0*  IBILI 1.2*   PT/INR Recent Labs    07/21/21 0413 07/22/21 0501  LABPROT 47.3* 30.7*  INR 5.1* 2.9*    Studies/Results: VAS [place] LOWER EXTREMITY VENOUS (DVT)  Result Date: 07/20/2021  Lower Venous DVT Study Patient Name:  Katelyn Jackson  Date of Exam:   07/20/2021 Medical Rec #: [id number]          Accession #:    [id number] Date of Birth: 04-15-1946          Patient Gender: F Patient Age:   25 years Exam Location:  [facility] Procedure:      VAS [place] LOWER EXTREMITY VENOUS (DVT) Referring Phys: [name] --------------------------------------------------------------------------------  Indications: Pain.  Risk Factors: None identified. Limitations: Body habitus and poor ultrasound/tissue interface. Comparison Study: No prior studies. Performing Technologist: Oliver Hum RVT  Examination Guidelines: A complete evaluation includes B-mode imaging, spectral Doppler, color Doppler, and power Doppler as needed of all accessible portions of each vessel. Bilateral testing is considered an integral part of a complete examination. Limited examinations for reoccurring indications may be performed as noted. The reflux portion of the exam is performed with the patient in reverse Trendelenburg.  +---------+---------------+---------+-----------+----------+-------------------+ RIGHT    CompressibilityPhasicitySpontaneityPropertiesThrombus Aging      +---------+---------------+---------+-----------+----------+-------------------+ CFV      Full           Yes      Yes                                      +---------+---------------+---------+-----------+----------+-------------------+  SFJ      Full                                                              +---------+---------------+---------+-----------+----------+-------------------+ FV Prox  Full                                                             +---------+---------------+---------+-----------+----------+-------------------+ FV Mid   Full                                                             +---------+---------------+---------+-----------+----------+-------------------+ FV Distal               Yes      Yes                                      +---------+---------------+---------+-----------+----------+-------------------+ PFV      Full                                                             +---------+---------------+---------+-----------+----------+-------------------+ POP      Full           Yes      Yes                                      +---------+---------------+---------+-----------+----------+-------------------+ PTV      Full                                                             +---------+---------------+---------+-----------+----------+-------------------+ PERO                                                  Not well visualized +---------+---------------+---------+-----------+----------+-------------------+   +---------+---------------+---------+-----------+----------+--------------+ LEFT     CompressibilityPhasicitySpontaneityPropertiesThrombus Aging +---------+---------------+---------+-----------+----------+--------------+ CFV      Full           Yes      Yes                                 +---------+---------------+---------+-----------+----------+--------------+ SFJ      Full                                                        +---------+---------------+---------+-----------+----------+--------------+  FV Prox  Full                                                         +---------+---------------+---------+-----------+----------+--------------+ FV Mid   Full                                                        +---------+---------------+---------+-----------+----------+--------------+ FV Distal               Yes      Yes                                 +---------+---------------+---------+-----------+----------+--------------+ PFV      Full                                                        +---------+---------------+---------+-----------+----------+--------------+ POP      Full           Yes      Yes                                 +---------+---------------+---------+-----------+----------+--------------+ PTV      Full                                                        +---------+---------------+---------+-----------+----------+--------------+ PERO     Full                                                        +---------+---------------+---------+-----------+----------+--------------+     Summary: RIGHT: - There is no evidence of deep vein thrombosis in the lower extremity. However, portions of this examination were limited- see technologist comments above.  - No cystic structure found in the popliteal fossa.  LEFT: - There is no evidence of deep vein thrombosis in the lower extremity. However, portions of this examination were limited- see technologist comments above.  - No cystic structure found in the popliteal fossa.  *See table(s) above for measurements and observations. Electronically signed by Harold Barban MD on 07/20/2021 at 7:12:23 PM.    Final    US Abdomen Limited RUQ (LIVER/GB)  Result Date: 07/21/2021 CLINICAL DATA:  Elevated INR, obesity EXAM: ULTRASOUND ABDOMEN LIMITED RIGHT UPPER QUADRANT COMPARISON:  01/05/2021 FINDINGS: Gallbladder: Echogenic material within the gallbladder compatible with sludge. No shadowing gallstones. No gallbladder wall thickening or pericholecystic fluid. Negative Murphy sign. Common  bile duct: Diameter: 7 mm Liver: Coarsened liver echotexture with nodularity of the liver capsule compatible with cirrhosis. No focal parenchymal abnormalities. No intrahepatic duct  dilation. Portal vein is patent on color Doppler imaging with normal direction of blood flow towards the liver. Other: None. IMPRESSION: 1. Gallbladder sludge. No evidence of cholelithiasis or cholecystitis. 2. Sonographic findings of cirrhosis. Electronically Signed   By: Randa Ngo M.D.   On: 07/21/2021 19:54    Impression and Plan Newly diagnosed cirrhosis with supratheraputic INR AB [place] with cirrhosis possibly from NASH but with elevated ALK will get labs to evaluate etiology. Had normal ferritin 06/2021.  Total bilirubin 2.2. AST 21, ALT 17, Alk phos is elevated at 326. Alk phos can be from liver or from current orthopedic infection No evidence of ascites Patient on torsemide, due to renal function can not add on spironolactone at this time.  History of metabolic encephalopathy, ammonia level 31 on 09/09, appears to be resolved but patient has had falls with classic asterixis on exam, has had loose stools, start lactulose and Xifaxan for possible hepatic encephalopathy.  Has received once dose of Vitamin K with decrease in INR MELD score is at 30  Chronic atrial fibrillation coumadin currently held due to supratheraputic levels  Right shoulder hemiarthroplasty May 2022, with possible infection.  ID is following- last dose of vancomycin 07/19/2021 with multiple antibiotics prior to this that could contribute to elevated INR Orthopedics wants possible wash out for infection Can have one more dose of Vitamin K, repeat INR in AM.  Multiple co morbidities  Pulmonary hypertension, renal failure, heart failure during this visit.    LOS: 20 days   Vladimir Crofts  PA-C 07/22/2021, 8:09 AM  Contact #  [phone]

## 2021-07-22 NOTE — Assessment & Plan Note (Addendum)
-   despite new diagnosis, she's had this a while; RUQ u/s on 9/14 shows nodular liver consistent with diagnosis; also some GB sludge but LFT pattern not consistent with obstruction  - she has had elevated INR despite coumadin washout with ongoing elevated LFTs which may have some confounding from underlying infection; ammonia also upper limit normal on 9/9 and she's responded well to lactulose (rifaximin also started per GI) - GI consulted for further assistance with evaluation - likely NAFLD; no history of etoh use that would explain - further autoimmune workup also commenced - follow up hepatitis panel as well  - renal function improved with IVF (was overdiuresis with torsemide)

## 2021-07-23 DIAGNOSIS — K7290 Hepatic failure, unspecified without coma: Secondary | ICD-10-CM | POA: Diagnosis not present

## 2021-07-23 DIAGNOSIS — L03116 Cellulitis of left lower limb: Secondary | ICD-10-CM | POA: Diagnosis not present

## 2021-07-23 DIAGNOSIS — K7460 Unspecified cirrhosis of liver: Secondary | ICD-10-CM

## 2021-07-23 DIAGNOSIS — Z515 Encounter for palliative care: Secondary | ICD-10-CM | POA: Diagnosis not present

## 2021-07-23 DIAGNOSIS — N1832 Chronic kidney disease, stage 3b: Secondary | ICD-10-CM | POA: Diagnosis not present

## 2021-07-23 DIAGNOSIS — C50412 Malignant neoplasm of upper-outer quadrant of left female breast: Secondary | ICD-10-CM

## 2021-07-23 DIAGNOSIS — R531 Weakness: Secondary | ICD-10-CM

## 2021-07-23 DIAGNOSIS — Z7189 Other specified counseling: Secondary | ICD-10-CM | POA: Diagnosis not present

## 2021-07-23 DIAGNOSIS — Z170 Estrogen receptor positive status [ER+]: Secondary | ICD-10-CM

## 2021-07-23 DIAGNOSIS — N179 Acute kidney failure, unspecified: Secondary | ICD-10-CM | POA: Diagnosis not present

## 2021-07-23 DIAGNOSIS — G9341 Metabolic encephalopathy: Secondary | ICD-10-CM | POA: Diagnosis not present

## 2021-07-23 DIAGNOSIS — L02413 Cutaneous abscess of right upper limb: Secondary | ICD-10-CM | POA: Diagnosis not present

## 2021-07-23 LAB — CBC WITH DIFFERENTIAL/PLATELET
Abs Immature Granulocytes: 0.02 10*3/uL (ref 0.00–0.07)
Basophils Absolute: 0.1 10*3/uL (ref 0.0–0.1)
Basophils Relative: 1 %
Eosinophils Absolute: 0.3 10*3/uL (ref 0.0–0.5)
Eosinophils Relative: 6 %
HCT: 30 % — ABNORMAL LOW (ref 36.0–46.0)
Hemoglobin: 9.4 g/dL — ABNORMAL LOW (ref 12.0–15.0)
Immature Granulocytes: 0 %
Lymphocytes Relative: 20 %
Lymphs Abs: 1 10*3/uL (ref 0.7–4.0)
MCH: 29.9 pg (ref 26.0–34.0)
MCHC: 31.3 g/dL (ref 30.0–36.0)
MCV: 95.5 fL (ref 80.0–100.0)
Monocytes Absolute: 0.8 10*3/uL (ref 0.1–1.0)
Monocytes Relative: 16 %
Neutro Abs: 3 10*3/uL (ref 1.7–7.7)
Neutrophils Relative %: 57 %
Platelets: 278 10*3/uL (ref 150–400)
RBC: 3.14 MIL/uL — ABNORMAL LOW (ref 3.87–5.11)
RDW: 16.3 % — ABNORMAL HIGH (ref 11.5–15.5)
WBC: 5.2 10*3/uL (ref 4.0–10.5)
nRBC: 0 % (ref 0.0–0.2)

## 2021-07-23 LAB — COMPREHENSIVE METABOLIC PANEL
ALT: 17 U/L (ref 0–44)
AST: 22 U/L (ref 15–41)
Albumin: 2.3 g/dL — ABNORMAL LOW (ref 3.5–5.0)
Alkaline Phosphatase: 303 U/L — ABNORMAL HIGH (ref 38–126)
Anion gap: 9 (ref 5–15)
BUN: 38 mg/dL — ABNORMAL HIGH (ref 8–23)
CO2: 33 mmol/L — ABNORMAL HIGH (ref 22–32)
Calcium: 9.8 mg/dL (ref 8.9–10.3)
Chloride: 93 mmol/L — ABNORMAL LOW (ref 98–111)
Creatinine, Ser: 1.98 mg/dL — ABNORMAL HIGH (ref 0.44–1.00)
GFR, Estimated: 26 mL/min — ABNORMAL LOW (ref >60)
Glucose, Bld: 90 mg/dL (ref 70–99)
Potassium: 3.4 mmol/L — ABNORMAL LOW (ref 3.5–5.1)
Sodium: 135 mmol/L (ref 135–145)
Total Bilirubin: 2.1 mg/dL — ABNORMAL HIGH (ref 0.3–1.2)
Total Protein: 6.3 g/dL — ABNORMAL LOW (ref 6.5–8.1)

## 2021-07-23 LAB — ANCA TITERS
Atypical P-ANCA titer: <1:20 {titer}
C-ANCA: <1:20 {titer}
P-ANCA: <1:20 {titer}

## 2021-07-23 LAB — MAGNESIUM: Magnesium: 2.6 mg/dL — ABNORMAL HIGH (ref 1.7–2.4)

## 2021-07-23 LAB — PROTIME-INR
INR: 1.5 — ABNORMAL HIGH (ref 0.8–1.2)
Prothrombin Time: 18 seconds — ABNORMAL HIGH (ref 11.4–15.2)

## 2021-07-23 LAB — HCV INTERPRETATION

## 2021-07-23 LAB — ANTI-SMOOTH MUSCLE ANTIBODY, IGG: F-Actin IgG: 8 Units (ref 0–19)

## 2021-07-23 LAB — ANA W/REFLEX IF POSITIVE: Anti Nuclear Antibody (ANA): NEGATIVE

## 2021-07-23 LAB — MITOCHONDRIAL ANTIBODIES: Mitochondrial M2 Ab, IgG: <20 Units (ref 0.0–20.0)

## 2021-07-23 LAB — CERULOPLASMIN: Ceruloplasmin: 32.5 mg/dL (ref 19.0–39.0)

## 2021-07-23 LAB — ALPHA-1-ANTITRYPSIN: A-1 Antitrypsin, Ser: 269 mg/dL — ABNORMAL HIGH (ref 101–187)

## 2021-07-23 LAB — HCV AB W REFLEX TO QUANT PCR: HCV Ab: <0.1 s/co ratio (ref 0.0–0.9)

## 2021-07-23 MED ORDER — LACTULOSE 10 GM/15ML PO SOLN
10.0000 g | Freq: Three times a day (TID) | ORAL | Status: DC
  Administered 2021-07-23 – 2021-07-30 (×20): 10 g via ORAL
  Filled 2021-07-23 (×20): qty 15

## 2021-07-23 MED ORDER — OXYCODONE HCL 5 MG PO TABS
5.0000 mg | ORAL_TABLET | ORAL | Status: DC | PRN
  Administered 2021-07-23 – 2021-07-25 (×6): 5 mg via ORAL
  Filled 2021-07-23 (×7): qty 1

## 2021-07-23 NOTE — Progress Notes (Signed)
Chaplain received a consult that pt wished to make her son her 60.  Her son is the legal next of kin as the only child and so determined that paperwork was not needed at this time.  Chaplain also inquired about emotional and spiritual support but they stated that they were doing fine for now.  If further needs arise, please page or consult [place].  [place], Bcc Pager, [phone] 3:52 PM

## 2021-07-23 NOTE — Progress Notes (Signed)
Physical Therapy Treatment Patient Details Name: Katelyn Jackson MRN: [id number] DOB: [birth year] Today's Date: 07/23/2021   History of Present Illness Katelyn Jackson is a 75 y.o. female brought by son to the ER for further evaluation of recent fall. Patient's son at the bedside is the historian:.  He tells me that patient fell from the bed on Monday and since then she is complaining of left shoulder pain, left ankle pain and bilateral knee pain, she is unable to walk, stand, go to bathroom and progressively declining. PMH: permanent A. fib, HTN, hyperlipidemia, s/p pacemaker placement, breast cancer, hypothyroidism, chronic bilateral lymphedema, recurrent falls, obesity, GERD, pulmonary hypertension. Developed Right  shoulder prosthetic infection.    PT Comments    Pt continues to be limited with participation due increased pain levels. Pt with complaints of Rt shoulder pain, new onset Rt LB/flank pain (increased tenderness with palpation), and B knee pain with transfers. Pt demonstrated very little progress with mobility this session. Able to perform minimal initiation of hips/LEs toward EOB requiring MAX A +2 to complete. Pt attempted x1 sit to stand with MAX A+2, unable to fully clear hips from EOB and shouting out in pain during transfer. Pt continues to be motivated and eager to participate in therapy session and improve functional mobility. Pt will benefit from continued skilled PT to increase their independence and maximize safety with mobility.      Recommendations for follow up therapy are one component of a multi-disciplinary discharge planning process, led by the attending physician.  Recommendations may be updated based on patient status, additional functional criteria and insurance authorization.  Follow Up Recommendations  SNF     Equipment Recommendations       Recommendations for Other Services       Precautions / Restrictions Precautions Precautions:  Fall Restrictions Weight Bearing Restrictions: No Other Position/Activity Restrictions: speaks russian     Mobility  Bed Mobility Overal bed mobility: Needs Assistance Bed Mobility: Supine to Sit;Sit to Supine Rolling: Max assist;+2 for physical assistance;+2 for safety/equipment   Supine to sit: +2 for physical assistance;+2 for safety/equipment;Max assist Sit to supine: Total assist;Max assist;+2 for physical assistance;+2 for safety/equipment   General bed mobility comments: Max assist +2 - patient with some initiation to move LEs to EOB and with use of Lt UE on bedrail, unable to use Rt UE to assist due to pain/limited ROM. Pt with Lt lateral lean in sitting due to pain with WB on R UE-using Lt UE for support for maintenance of upright seated balance.  Pt Total assist +2 to return to supine grimacing in pain.    Transfers Overall transfer level: Needs assistance Equipment used: None Transfers: Sit to/from Stand Sit to Stand: Max assist;+2 physical assistance;+2 safety/equipment;From elevated surface         General transfer comment: Attempted sit to stand with +2 assist from elevated bed surface, no AD due to pain in R UE. Pt unable to clear hips fully from EOB despite +2 assist. Pt screaming out in pain reporting pain in B knees R>L, and intermittently scratching/rubbing Rt flank region reporting pain. Pt deferred further mobility due to increased pain levels.  Ambulation/Gait                 Stairs             Wheelchair Mobility    Modified Rankin (Stroke Patients Only)       Balance Overall balance assessment: Needs assistance Sitting-balance support: Single extremity supported;Feet supported  Sitting balance-Leahy Scale: Poor   Postural control: Left lateral lean Standing balance support: Bilateral upper extremity supported Standing balance-Leahy Scale: Zero Standing balance comment: heavily reliant on +2 assist                             Cognition Arousal/Alertness: Awake/alert Behavior During Therapy: WFL for tasks assessed/performed Overall Cognitive Status: Within Functional Limits for tasks assessed                                 General Comments: Patient able to understand and speak some english, interpreter Ipad was used Evon Slack [phone])      Exercises      General Comments        Pertinent Vitals/Pain Pain Assessment: Faces Faces Pain Scale: Hurts whole lot Pain Location: Rt shoulder, Rt LB radiating to R flank-pt screaming in pain with palpation, RN notified Pain Descriptors / Indicators: Crying;Grimacing;Sore;Tender Pain Intervention(s): Limited activity within patient's tolerance;Monitored during session;Repositioned    Home Living                      Prior Function            PT Goals (current goals can now be found in the care plan section) Acute Rehab PT Goals Patient Stated Goal: to stand and walk again PT Goal Formulation: With family Time For Goal Achievement: 08/02/21 (extended by 2 weeks) Potential to Achieve Goals: Fair Progress towards PT goals: Progressing toward goals    Frequency    Min 2X/week      PT Plan Current plan remains appropriate    Co-evaluation              AM-PAC PT "6 Clicks" Mobility   Outcome Measure  Help needed turning from your back to your side while in a flat bed without using bedrails?: Total Help needed moving from lying on your back to sitting on the side of a flat bed without using bedrails?: Total Help needed moving to and from a bed to a chair (including a wheelchair)?: Total Help needed standing up from a chair using your arms (e.g., wheelchair or bedside chair)?: Total Help needed to walk in hospital room?: Total Help needed climbing 3-5 steps with a railing? : Total 6 Click Score: 6    End of Session Equipment Utilized During Treatment: Gait belt Activity Tolerance: Patient limited by pain Patient  left: in bed;with call bell/phone within reach;with bed alarm set Nurse Communication: Mobility status (and new Rt side LB/flank pain) PT Visit Diagnosis: Other abnormalities of gait and mobility (R26.89);Muscle weakness (generalized) (M62.81)     Time: 7846-9629 (6 min not billable, pt and PA from GI  discussing POC) PT Time Calculation (min) (ACUTE ONLY): 45 min  Charges:  $Therapeutic Activity: 38-52 mins                     Posey Rea, DPT  Acute Rehabilitation Services  Office [phone]    Estanislado Spire 07/23/2021, 2:27 PM

## 2021-07-23 NOTE — Progress Notes (Signed)
Subjective:  She says she feels a bit better today     Antibiotics:  Anti-infectives (From admission, onward)    Start     Dose/Rate Route Frequency Ordered Stop   07/22/21 1100  rifaximin (XIFAXAN) tablet 550 mg        550 mg Oral 2 times daily 07/22/21 1005     07/16/21 1200  vancomycin (VANCOREADY) IVPB 750 mg/150 mL  Status:  Discontinued        750 mg 150 mL/hr over 60 Minutes Intravenous Every 24 hours 07/15/21 1242 07/19/21 1537   07/15/21 1400  vancomycin (VANCOREADY) IVPB 2000 mg/400 mL        2,000 mg 200 mL/hr over 120 Minutes Intravenous  Once 07/15/21 1242 07/15/21 1522   07/10/21 1530  doxycycline (VIBRA-TABS) tablet 100 mg  Status:  Discontinued        100 mg Oral Every 12 hours 07/10/21 1437 07/15/21 1127   07/05/21 1245  cefadroxil (DURICEF) capsule 500 mg        500 mg Oral 2 times daily 07/05/21 1155 07/07/21 2217   07/03/21 0900  cefTRIAXone (ROCEPHIN) 2 g in sodium chloride 0.9 % 100 mL IVPB  Status:  Discontinued        2 g 200 mL/hr over 30 Minutes Intravenous Every 24 hours 07/02/21 0950 07/05/21 1155   07/02/21 0915  cefTRIAXone (ROCEPHIN) 2 g in sodium chloride 0.9 % 100 mL IVPB        2 g 200 mL/hr over 30 Minutes Intravenous  Once 07/02/21 0909 07/02/21 1033       Medications: Scheduled Meds:  carvedilol  6.25 mg Oral BID WC   cholecalciferol  2,000 Units Oral Daily   exemestane  25 mg Oral QPC breakfast   lactulose  10 g Oral TID   levothyroxine  50 mcg Oral QAC breakfast   pantoprazole  40 mg Oral Daily   potassium chloride  20 mEq Oral Daily   pravastatin  20 mg Oral q1800   rifaximin  550 mg Oral BID   vitamin B-12  2,000 mcg Oral Daily   Continuous Infusions:  sodium chloride 250 mL (07/16/21 1319)   PRN Meds:.sodium chloride, acetaminophen **OR** acetaminophen, diclofenac Sodium, lipase/protease/amylase, ondansetron **OR** ondansetron (ZOFRAN) IV, oxyCODONE, polyvinyl alcohol, simethicone, traMADol, witch  hazel-glycerin    Objective: Weight change:   Intake/Output Summary (Last 24 hours) at 07/23/2021 1729 Last data filed at 07/23/2021 1400 Gross per 24 hour  Intake 710.76 ml  Output 675 ml  Net 35.76 ml    Blood pressure 133/69, pulse 75, temperature 98.6 F (37 C), temperature source Oral, resp. rate 16, height 5\' 1"  (1.549 m), weight 105 kg, SpO2 96 %. Temp:  [97.7 F (36.5 C)-98.7 F (37.1 C)] 98.6 F (37 C) (09/16 1307) Pulse Rate:  [52-78] 75 (09/16 1716) Resp:  [16-18] 16 (09/16 1307) BP: (121-136)/(64-94) 133/69 (09/16 1716) SpO2:  [96 %] 96 % (09/16 1307)  Physical Exam: Physical Exam Constitutional:      Appearance: She is obese.  Eyes:     General:        Right eye: No discharge.        Left eye: No discharge.     Extraocular Movements: Extraocular movements intact.  Musculoskeletal:     Right shoulder: Tenderness present. Decreased range of motion.     Right lower leg: Edema present.     Left lower leg: Edema present.  Skin:  General: Skin is dry.     Coloration: Skin is pale.  Neurological:     General: No focal deficit present.     Mental Status: She is alert.  Psychiatric:        Attention and Perception: Attention normal.        Mood and Affect: Mood is anxious.        Speech: Speech normal.        Behavior: Behavior normal.        Judgment: Judgment normal.     Skin lesion on right shoulder:      CBC:    BMET Recent Labs    07/22/21 0501 07/23/21 0416  NA 133* 135  K 3.7 3.4*  CL 90* 93*  CO2 33* 33*  GLUCOSE 103* 90  BUN 42* 38*  CREATININE 2.24* 1.98*  CALCIUM 10.2 9.8      Liver Panel  Recent Labs    07/21/21 0413 07/22/21 0501 07/23/21 0416  PROT 6.5 6.9 6.3*  ALBUMIN 2.5* 2.5* 2.3*  AST 21 22 22   ALT 17 14 17   ALKPHOS 326* 320* 303*  BILITOT 2.2* 2.0* 2.1*  BILIDIR 1.0* 0.9*  --   IBILI 1.2* 1.1*  --         Sedimentation Rate Recent Labs    07/21/21 0413  ESRSEDRATE 75*    C-Reactive  Protein Recent Labs    07/21/21 0413  CRP 11.4*     Micro Results: Recent Results (from the past 720 hour(s))  Urine Culture     Status: Abnormal   Collection Time: 07/01/21  2:39 PM   Specimen: Urine, Clean Catch  Result Value Ref Range Status   Specimen Description   Final    URINE, CLEAN CATCH Performed at [facility], [place] [address] [place], [place] [address]    Special Requests   Final    NONE Performed at [facility], [place] [address], [place], [place] [address]    Culture MULTIPLE SPECIES PRESENT, SUGGEST RECOLLECTION (A)  Final   Report Status 07/02/2021 FINAL  Final  SARS CORONAVIRUS 2 (TAT 6-24 HRS) Nasopharyngeal Nasopharyngeal Swab     Status: None   Collection Time: 07/02/21 11:19 AM   Specimen: Nasopharyngeal Swab  Result Value Ref Range Status   SARS Coronavirus 2 NEGATIVE NEGATIVE Final    Comment: (NOTE) SARS-CoV-2 target nucleic acids are NOT DETECTED.  The SARS-CoV-2 RNA is generally detectable in upper and lower respiratory specimens during the acute phase of infection. Negative results do not preclude SARS-CoV-2 infection, do not rule out co-infections with other pathogens, and should not be used as the sole basis for treatment or other patient management decisions. Negative results must be combined with clinical observations, patient history, and epidemiological information. The expected result is Negative.  Fact Sheet for Patients: SugarRoll.be  Fact Sheet for Healthcare Providers: https://www.woods-mathews.com/  This test is not yet approved or cleared by the [place] FDA and  has been authorized for detection and/or diagnosis of SARS-CoV-2 by FDA under an Emergency Use Authorization (EUA). This EUA will remain  in effect (meaning this test can be used) for the duration of the COVID-19 declaration under Se ction 564(b)(1) of the Act, 21 U.S.C. section  360bbb-3(b)(1), unless the authorization is terminated or revoked sooner.  Performed at [facility], [place] [address], [place], [place] [address]   Resp Panel by RT-PCR (Flu A&B, Covid) Nasopharyngeal Swab     Status: None  Collection Time: 07/13/21  6:05 PM   Specimen: Nasopharyngeal Swab; Nasopharyngeal(NP) swabs in vial transport medium  Result Value Ref Range Status   SARS Coronavirus 2 by RT PCR NEGATIVE NEGATIVE Final    Comment: (NOTE) SARS-CoV-2 target nucleic acids are NOT DETECTED.  The SARS-CoV-2 RNA is generally detectable in upper respiratory specimens during the acute phase of infection. The lowest concentration of SARS-CoV-2 viral copies this assay can detect is 138 copies/mL. A negative result does not preclude SARS-Cov-2 infection and should not be used as the sole basis for treatment or other patient management decisions. A negative result may occur with  improper specimen collection/handling, submission of specimen other than nasopharyngeal swab, presence of viral mutation(s) within the areas targeted by this assay, and inadequate number of viral copies(<138 copies/mL). A negative result must be combined with clinical observations, patient history, and epidemiological information. The expected result is Negative.  Fact Sheet for Patients:  EntrepreneurPulse.com.au  Fact Sheet for Healthcare Providers:  IncredibleEmployment.be  This test is no t yet approved or cleared by the [place] FDA and  has been authorized for detection and/or diagnosis of SARS-CoV-2 by FDA under an Emergency Use Authorization (EUA). This EUA will remain  in effect (meaning this test can be used) for the duration of the COVID-19 declaration under Section 564(b)(1) of the Act, 21 U.S.C.section 360bbb-3(b)(1), unless the authorization is terminated  or revoked sooner.       Influenza A by PCR NEGATIVE NEGATIVE Final   Influenza B by PCR  NEGATIVE NEGATIVE Final    Comment: (NOTE) The Xpert Xpress SARS-CoV-2/FLU/RSV plus assay is intended as an aid in the diagnosis of influenza from Nasopharyngeal swab specimens and should not be used as a sole basis for treatment. Nasal washings and aspirates are unacceptable for Xpert Xpress SARS-CoV-2/FLU/RSV testing.  Fact Sheet for Patients: EntrepreneurPulse.com.au  Fact Sheet for Healthcare Providers: IncredibleEmployment.be  This test is not yet approved or cleared by the [place] FDA and has been authorized for detection and/or diagnosis of SARS-CoV-2 by FDA under an Emergency Use Authorization (EUA). This EUA will remain in effect (meaning this test can be used) for the duration of the COVID-19 declaration under Section 564(b)(1) of the Act, 21 U.S.C. section 360bbb-3(b)(1), unless the authorization is terminated or revoked.  Performed at [facility], [place] [address] [place], [place] [address]   Aerobic/Anaerobic Culture w Gram Stain (surgical/deep wound)     Status: None   Collection Time: 07/16/21  4:20 PM   Specimen: Abscess  Result Value Ref Range Status   Specimen Description   Final    ABSCESS RIGHT ARM Performed at [place] [address], [place], [place] [address]    Special Requests   Final    NONE Performed at [facility], [place] [address] [place], [place] [address]    Gram Stain   Final    ABUNDANT WBC PRESENT,BOTH PMN AND MONONUCLEAR NO ORGANISMS SEEN    Culture   Final    No growth aerobically or anaerobically. Performed at [facility], [place] [address] [place], [place] [address]    Report Status 07/21/2021 FINAL  Final  Aerobic/Anaerobic Culture w Gram Stain (surgical/deep wound)     Status: None   Collection Time: 07/16/21  4:25 PM   Specimen: Joint, Right Shoulder; Synovial Fluid  Result Value Ref Range Status   Specimen Description    Final    JOINT FLUID RIGHT SHOULDER Performed  at [facility], [place] [address], [place], [place] [address]    Special Requests   Final    NONE Performed at [facility], [place] [address] [place], [place] [address]    Gram Stain   Final    FEW WBC PRESENT, PREDOMINANTLY PMN NO ORGANISMS SEEN    Culture   Final    No growth aerobically or anaerobically. Performed at [facility], [place] [address], [place], [place] [address]    Report Status 07/21/2021 FINAL  Final  Resp Panel by RT-PCR (Flu A&B, Covid) Nasopharyngeal Swab     Status: None   Collection Time: 07/17/21 11:13 AM   Specimen: Nasopharyngeal Swab; Nasopharyngeal(NP) swabs in vial transport medium  Result Value Ref Range Status   SARS Coronavirus 2 by RT PCR NEGATIVE NEGATIVE Final    Comment: (NOTE) SARS-CoV-2 target nucleic acids are NOT DETECTED.  The SARS-CoV-2 RNA is generally detectable in upper respiratory specimens during the acute phase of infection. The lowest concentration of SARS-CoV-2 viral copies this assay can detect is 138 copies/mL. A negative result does not preclude SARS-Cov-2 infection and should not be used as the sole basis for treatment or other patient management decisions. A negative result may occur with  improper specimen collection/handling, submission of specimen other than nasopharyngeal swab, presence of viral mutation(s) within the areas targeted by this assay, and inadequate number of viral copies(<138 copies/mL). A negative result must be combined with clinical observations, patient history, and epidemiological information. The expected result is Negative.  Fact Sheet for Patients:  EntrepreneurPulse.com.au  Fact Sheet for Healthcare Providers:  IncredibleEmployment.be  This test is no t yet approved or cleared by the [place] FDA and  has been authorized for detection and/or diagnosis of SARS-CoV-2  by FDA under an Emergency Use Authorization (EUA). This EUA will remain  in effect (meaning this test can be used) for the duration of the COVID-19 declaration under Section 564(b)(1) of the Act, 21 U.S.C.section 360bbb-3(b)(1), unless the authorization is terminated  or revoked sooner.       Influenza A by PCR NEGATIVE NEGATIVE Final   Influenza B by PCR NEGATIVE NEGATIVE Final    Comment: (NOTE) The Xpert Xpress SARS-CoV-2/FLU/RSV plus assay is intended as an aid in the diagnosis of influenza from Nasopharyngeal swab specimens and should not be used as a sole basis for treatment. Nasal washings and aspirates are unacceptable for Xpert Xpress SARS-CoV-2/FLU/RSV testing.  Fact Sheet for Patients: EntrepreneurPulse.com.au  Fact Sheet for Healthcare Providers: IncredibleEmployment.be  This test is not yet approved or cleared by the [place] FDA and has been authorized for detection and/or diagnosis of SARS-CoV-2 by FDA under an Emergency Use Authorization (EUA). This EUA will remain in effect (meaning this test can be used) for the duration of the COVID-19 declaration under Section 564(b)(1) of the Act, 21 U.S.C. section 360bbb-3(b)(1), unless the authorization is terminated or revoked.  Performed at [facility], [place] [address] [place], [place] [address]     Studies/Results: US Abdomen Limited RUQ (LIVER/GB)  Result Date: 07/21/2021 CLINICAL DATA:  Elevated INR, obesity EXAM: ULTRASOUND ABDOMEN LIMITED RIGHT UPPER QUADRANT COMPARISON:  01/05/2021 FINDINGS: Gallbladder: Echogenic material within the gallbladder compatible with sludge. No shadowing gallstones. No gallbladder wall thickening or pericholecystic fluid. Negative Murphy sign. Common bile duct: Diameter: 7 mm Liver: Coarsened liver echotexture with nodularity of the liver capsule compatible with cirrhosis. No focal parenchymal abnormalities. No intrahepatic  duct dilation. Portal vein  is patent on color Doppler imaging with normal direction of blood flow towards the liver. Other: None. IMPRESSION: 1. Gallbladder sludge. No evidence of cholelithiasis or cholecystitis. 2. Sonographic findings of cirrhosis. Electronically Signed   By: Randa Ngo M.D.   On: 07/21/2021 19:54      Assessment/Plan:  INTERVAL HISTORY: INR is now at 1.5   Principal Problem:   Abscess of right shoulder Active Problems:   Essential hypertension   Chronic atrial fibrillation (HCC)   Lymphedema   Malignant neoplasm of upper-outer quadrant of left breast in female, estrogen receptor positive (HCC)   Anemia of chronic disease   Acute renal failure superimposed on stage 3b chronic kidney disease (HCC)   Morbid obesity (HCC)   GERD (gastroesophageal reflux disease)   Bilateral pain of leg and foot   Hypothyroidism   Supratherapeutic INR   Pressure injury of skin   History of pacemaker   Decompensated hepatic cirrhosis ([place])   Abscess   Prosthetic joint infection (HCC)    Katelyn Jackson is a 75 y.o. female with [place] medical problems including atrial fibrillation on Coumadin, morbid obesity chronic lymphedema admitted with systemic infections concerning for infection and with significant fluid above and in the right shoulder where she has had placement of prosthetic shoulder in May 2022, also with pacemaker that was placed this year.  #1 Prosthetic shoulder infection:  Continue to hold antibiotics to optimize intraoperative cultures.  I really do not see how that her shoulder can be managed without I&D and source control.  Defer to Dr. Griffin Basil if prosthetic material will be removed from ID standpoint that is most desirable to have no hardware if possible in the joint   #2 Pace maker: again size how important it is to protect her pacemaker from getting infected from any particular infection in her body and therefore the even greater need to have control  over her shoulder infection.  #3 Chronic lymphoedema: I am skeptical that she really had much in the way of cellulitis on admission.  It has resolved  #4 Cirrhosis: likely from NASH   #5  Supratherapeutic INR now at 1.5  I spent 36 minutes with the patient including  face to face counseling of the patient with her son in [name] re need for surgery, plan from my standpoint,  personally reviewing CBC CMP, INR cultures, alongwith , review of medical records in preparation for the visit and during the visit and in coordination of her care.   Dr. Juleen China is available for questions this weekend and I will be back on Monday.   LOS: 21 days   Alcide Evener 07/23/2021, 5:29 PM

## 2021-07-23 NOTE — Progress Notes (Signed)
Daily Progress Note   Patient Name: Katelyn Jackson       Date: 07/23/2021 DOB: [birth year]  Age: 75 y.o. MRN#: [id number] Attending Physician: Dwyane Dee, MD Primary Care Physician: Cassandria Anger, MD Admit Date: 07/01/2021  Reason for Consultation/Follow-up: Establishing goals of care  Subjective: Patient complains of ongoing pain and discomfort in her right shoulder, son not at bedside, call placed and discussed with son about CODE STATUS and broad goals of care after patient defers to her son.  Length of Stay: 21  Current Medications: Scheduled Meds:   carvedilol  6.25 mg Oral BID WC   cholecalciferol  2,000 Units Oral Daily   exemestane  25 mg Oral QPC breakfast   lactulose  10 g Oral BID   levothyroxine  50 mcg Oral QAC breakfast   pantoprazole  40 mg Oral Daily   potassium chloride  20 mEq Oral Daily   pravastatin  20 mg Oral q1800   rifaximin  550 mg Oral BID   vitamin B-12  2,000 mcg Oral Daily    Continuous Infusions:  sodium chloride 250 mL (07/16/21 1319)    PRN Meds: sodium chloride, acetaminophen **OR** acetaminophen, diclofenac Sodium, lipase/protease/amylase, ondansetron **OR** ondansetron (ZOFRAN) IV, oxyCODONE, polyvinyl alcohol, simethicone, traMADol, witch hazel-glycerin  Physical Exam         Awake alert resting in bed States that overall she is feeling better but still has pain and discomfort in her right shoulder Regular work of breathing Has edema Abdomen is not tender Patient has a skin lesion on her right shoulder Vital Signs: BP 121/64   Pulse 78   Temp 98.7 F (37.1 C) (Oral)   Resp 18   Ht 5\' 1"  (1.549 m)   Wt 105 kg   LMP  (LMP Unknown)   SpO2 96%   BMI 43.74 kg/m  SpO2: SpO2: 96 % O2 Device: O2 Device: Room Air O2 Flow  Rate: O2 Flow Rate (L/min): 2 L/min  Intake/output summary:  Intake/Output Summary (Last 24 hours) at 07/23/2021 1202 Last data filed at 07/23/2021 0820 Gross per 24 hour  Intake 950.76 ml  Output 1575 ml  Net -624.24 ml   LBM: Last BM Date: 07/21/21 Baseline Weight: Weight: 113.4 kg Most recent weight: Weight: 105 kg       Palliative Assessment/Data:  Patient Active Problem List   Diagnosis Date Noted   Decompensated hepatic cirrhosis ([place]) 07/22/2021   Abscess    Prosthetic joint infection ([place])    Abscess of right shoulder 07/21/2021   History of pacemaker 63/89/3734   Acute metabolic encephalopathy 28/76/8115   Pressure injury of skin 07/03/2021   Hypothyroidism    Supratherapeutic INR    Cellulitis    Postoperative anemia due to acute blood loss 03/19/2021   Dyspnea 03/19/2021   Symptomatic anemia 03/19/2021   Status post right shoulder hemiarthroplasty 03/17/2021   Bilateral pain of leg and foot 10/08/2020   Abscess of left thumb 06/17/2020   Cellulitis and abscess of left leg 06/17/2020   Herpes zoster 02/18/2020   GERD (gastroesophageal reflux disease) 11/25/2019   Wheezing 10/26/2017   Sciatica of left side 08/29/2017   Long term (current) use of anticoagulants 08/02/2017   Morbid obesity ([place]) 07/11/2017   Hyperglycemia 07/11/2017   Low back pain 06/19/2017   Acute renal failure superimposed on stage 3b chronic kidney disease ([place]) 06/19/2017   Hypokalemia 06/19/2017   Acute lower UTI 06/19/2017   Anemia of chronic disease 02/16/2017   Malignant neoplasm of upper-outer quadrant of left breast in female, estrogen receptor positive ([place]) 02/10/2017   Essential hypertension 04/05/2016   Dyslipidemia 04/05/2016   Chronic atrial fibrillation ([place]) 04/05/2016   Pulmonary hypertension ([place]) 04/05/2016   Lymphedema 04/05/2016    Palliative Care Assessment & Plan   Patient Profile:   Assessment: 75 year old lady who lives at home with her son  admitted for abscess of right shoulder, status post right shoulder hemiarthroplasty in May 2022.  Aspiration of the shoulder on 07-16-2021 was done that showed fluid to thick and possibly pus.  Infectious diseases following.  Orthopedics is following.  Patient likely being considered for washout of that shoulder.  She has high INR and was on oral anticoagulation prior to this hospitalization as well as now there is concern for underlying cirrhosis possibly NASH.  Patient also has acute renal failure superimposed on stage IIIb chronic kidney disease.  She also has history of pacemaker placement hypothyroidism.  She has poor functional status and is largely in bed or has to use a wheelchair.  Palliative medicine team has been consulted for CODE STATUS and goals of care discussions.  Recommendations/Plan: Pain: Medication history reviewed.  Will adjust her oral opioids for more adequate pain relief As of care discussions: Discussed with patient who defers to her son.  Call placed and discussed with [name].  He states that he is continuing to discuss about CODE STATUS with the patient, to remain full code for now.  Recommend outpatient palliative services continuing to follow with the patient.  I worry that she will have ongoing decline in her functional status and has a host of acute and underlying serious illnesses.  No further PMT specific interventions.  Please call if needed.  Goals of Care and Additional Recommendations: Limitations on Scope of Treatment: Full Scope Treatment  Code Status:    Code Status Orders  (From admission, onward)           Start     Ordered   07/02/21 0948  Full code  Continuous        07/02/21 0950           Code Status History     Date Active Date Inactive Code Status Order ID Comments User Context   03/17/2021 1816 03/20/2021 2201 Full Code [id number]  Ethelda Chick,  PA-C Inpatient   06/19/2017 0534 06/24/2017 0336 Full Code [id number]  Etta Quill, DO  ED       Prognosis:  Unable to determine  Discharge Planning: To Be Determined Recommend outpatient palliative care either home with palliative or SNF with palliative upon discharge. Care plan was discussed with patient son as well as discussed with Dr. Sabino Gasser.   Thank you for allowing the Palliative Medicine Team to assist in the care of this patient.   Time In: 11 Time Out: 11.25 Total Time 25 Prolonged Time Billed  no       Greater than 50%  of this time was spent counseling and coordinating care related to the above assessment and plan.  Loistine Chance, MD  Please contact Palliative Medicine Team phone at [phone] for questions and concerns.

## 2021-07-23 NOTE — Progress Notes (Signed)
Progress Note    Katelyn Jackson   BRA:[id number]  DOB: July 03, 1946  DOA: 07/01/2021     21  PCP: Katelyn Anger, MD  Initial CC: Left-sided pain in ankle, knee, shoulder and falling at home  Hospital Course: Katelyn Jackson is a 75 yo female with PMH permanent Afib (on Coumadin), HTN, HLD, pacer in place, hx breast cancer, hypothyroidism, chronic bilateral lower extremity lymphedema, morbid obesity, recurrent falls, GERD, pulmonary hypertension who presented to the hospital with worsening weakness and falls at home.  She had endorsed pain in her left ankle, knee, and shoulder. She was initially treated for suspected cellulitis in her left leg due to some increased erythema noted on admission. She was also noted to have a wound over her right shoulder and underwent further work-up for this as well.  See below for further A&P.   Interval History:  No events overnight. Called and discussed need for surgery still with son. He is in agreement and understands. He plans to have a meeting with orthopedic surgery tomorrow again he says.  She still has tremendous pain with minimal movement of arm or palpation near abscess tract.   ROS: Constitutional: negative for chills and fevers, Respiratory: negative for cough and wheezing, Cardiovascular: negative for chest pain, and Gastrointestinal: negative for abdominal pain  Assessment & Plan: * Abscess of right shoulder - s/p right shoulder hemiarthroplasty in May 2022 - s/p aspiration of shoulder on 07/16/21 but appears fluid too thick for likely a good sample and pus was noted during procedure - agree with ID this needs washout as aspiration will be ineffective; she has ongoing pain which is confounded some by her chronic degeneration and atrophy of rotator cuff and narrowing of subacromial space seen on CT - INR does respond to Vit K and after further workup appears elevated INR due to underlying cirrhosis with likely some synthetic  dysfunction of liver - we can give Vit K and FFP in order to bridge thru surgery (ongoing discussion with orthopedic surgery to coordinate timing); ESR elevated 75 and CRP elevated 11.4 - patient son also amendable to surgery; discussed on phone with him 9/16  Decompensated hepatic cirrhosis ([place]) - despite new diagnosis, she's had this a while; RUQ u/s on 9/14 shows nodular liver consistent with diagnosis; also some GB sludge but LFT pattern not consistent with obstruction  - she has had elevated INR despite coumadin washout with ongoing elevated LFTs which may have some confounding from underlying infection; ammonia also upper limit normal on 9/9 and she's responded well to lactulose (rifaximin also started per GI) - GI consulted for further assistance with evaluation - likely NAFLD; no history of etoh use that would explain - further autoimmune workup also commenced - follow up hepatitis panel as well  - renal function improved with IVF (was overdiuresis with torsemide)  Supratherapeutic INR - INR peaked at 9.9 after admission on 8/26 and she was given Vit K to help reverse  - last Coumadin dose 9/9  - see cirrhosis now as well - will give Vit K and FFP pending timing of hopeful shoulder washout  Chronic atrial fibrillation ([place]) - on Coumadin at home prior to admission - holding anticoagulation for now in setting of need for surgery - see INR workup as well  - given new diagnosis of cirrhosis, will transition her to Eliquis once resuming anticoagulation   Acute renal failure superimposed on stage 3b chronic kidney disease ([place]) - patient has history of CKD3b. Baseline creat ~  1.4 - 1.5, eGFR 31 - 41 - has been on torsemide 40 mg BID likely contributing to overdiuresis; not consistent with a hepatorenal syndrome at this time - torsemide stopped on 9/15 - will give small fluid resuscitation today and follow up renal function in am (creatinine responded well) - continue holding  diuretic and will resume as needed   Acute metabolic encephalopathy-resolved as of 07/23/2021 - Multifactorial and likely some hospital delirium contributing given extended length of stay - patient symptoms include occasional AMS - etiology considered due to toxic/metabolic - continue re-orienting as needed - treating infection - given recently elevated NH3 and now diagnosis consistent with cirrhosis, she is also started on lactulose and rifaximin   Cellulitis of left lower extremity-resolved as of 07/21/2021 - mild erythema on admission from media pic; now treated and resolved   History of pacemaker - stable; at risk for infection seeding without source control  - implanted at [facility] on 01/06/21: [employer] pacemaker with model number of ASSURITY MRI PM 2272 and serial number of [id number]   Hypothyroidism - Continue Synthroid  GERD (gastroesophageal reflux disease) - Continue Protonix  Morbid obesity ([place]) - Complicates overall prognosis and care - Body mass index is 43.36 kg/m. - Weight Loss and Dietary Counseling given  Anemia of chronic disease - Baseline hemoglobin 9 to 10 g/dL -Currently at baseline and no signs of bleeding  Malignant neoplasm of upper-outer quadrant of left breast in female, estrogen receptor positive ([place]) - outpatient follow up as indicated   Lymphedema - Stable edema.  No further signs of cellulitis involving lower extremities  Essential hypertension - Continue Coreg  Acute respiratory failure with hypoxia ([facility])-resolved as of 07/21/2021 secondary to fluid overload,  acute on chronic systolic CHF, hypercapnia likely from OSA. Respiratory status much improved.  Patient had adequate diuresis with negative fluid balance. Continue supplemental oxygen to keep saturation above 94%. Diuretics changed to torsemide  - now on RA   Old records reviewed in assessment of this patient  Antimicrobials:   DVT prophylaxis: SCDs Start: 07/02/21  0948   Code Status:   Code Status: Full Code Family Communication: son, Katelyn Jackson   Disposition Plan: Status is: Inpatient  Remains inpatient appropriate because:Ongoing diagnostic testing needed not appropriate for outpatient work up, IV treatments appropriate due to intensity of illness or inability to take PO, and Inpatient level of care appropriate due to severity of illness  Dispo: The patient is from: Home              Anticipated d/c is to: SNF once workup complete               Patient currently is not medically stable to d/c.   Difficult to place patient No  Risk of unplanned readmission score: Unplanned Admission- Pilot do not use: 36.8   Objective: Blood pressure 123/64, pulse 62, temperature 98.6 F (37 C), temperature source Oral, resp. rate 16, height 5' 1"  (1.549 m), weight 105 kg, SpO2 96 %.  Examination: General appearance: alert, cooperative, no distress, and morbidly obese Head: Normocephalic, without obvious abnormality, atraumatic Eyes:  EOMI Lungs: clear to auscultation bilaterally Heart: irregularly irregular rhythm and S1, S2 normal Abdomen:  obese, soft, NT, BS present Extremities:  right shoulder noted with fluctuant lesion; pain with passive ROM in shoulder; no erythema or edema noted. LE's show chronic lymphedema without erythema or signs of infection Skin: mobility and turgor normal Neurologic: Grossly normal  Consultants:  ID Ortho surgery GI  Procedures:    Data Reviewed: I have personally reviewed following labs and imaging studies Results for orders placed or performed during the hospital encounter of 07/01/21 (from the past 24 hour(s))  Protime-INR     Status: Abnormal   Collection Time: 07/23/21  4:16 AM  Result Value Ref Range   Prothrombin Time 18.0 (H) 11.4 - 15.2 seconds   INR 1.5 (H) 0.8 - 1.2  CBC with Differential/Platelet     Status: Abnormal   Collection Time: 07/23/21  4:16 AM  Result Value Ref Range   WBC 5.2 4.0 - 10.5 K/uL    RBC 3.14 (L) 3.87 - 5.11 MIL/uL   Hemoglobin 9.4 (L) 12.0 - 15.0 g/dL   HCT 30.0 (L) 36.0 - 46.0 %   MCV 95.5 80.0 - 100.0 fL   MCH 29.9 26.0 - 34.0 pg   MCHC 31.3 30.0 - 36.0 g/dL   RDW 16.3 (H) 11.5 - 15.5 %   Platelets 278 150 - 400 K/uL   nRBC 0.0 0.0 - 0.2 %   Neutrophils Relative % 57 %   Neutro Abs 3.0 1.7 - 7.7 K/uL   Lymphocytes Relative 20 %   Lymphs Abs 1.0 0.7 - 4.0 K/uL   Monocytes Relative 16 %   Monocytes Absolute 0.8 0.1 - 1.0 K/uL   Eosinophils Relative 6 %   Eosinophils Absolute 0.3 0.0 - 0.5 K/uL   Basophils Relative 1 %   Basophils Absolute 0.1 0.0 - 0.1 K/uL   Immature Granulocytes 0 %   Abs Immature Granulocytes 0.02 0.00 - 0.07 K/uL  Magnesium     Status: Abnormal   Collection Time: 07/23/21  4:16 AM  Result Value Ref Range   Magnesium 2.6 (H) 1.7 - 2.4 mg/dL  Comprehensive metabolic panel     Status: Abnormal   Collection Time: 07/23/21  4:16 AM  Result Value Ref Range   Sodium 135 135 - 145 mmol/L   Potassium 3.4 (L) 3.5 - 5.1 mmol/L   Chloride 93 (L) 98 - 111 mmol/L   CO2 33 (H) 22 - 32 mmol/L   Glucose, Bld 90 70 - 99 mg/dL   BUN 38 (H) 8 - 23 mg/dL   Creatinine, Ser 1.98 (H) 0.44 - 1.00 mg/dL   Calcium 9.8 8.9 - 10.3 mg/dL   Total Protein 6.3 (L) 6.5 - 8.1 g/dL   Albumin 2.3 (L) 3.5 - 5.0 g/dL   AST 22 15 - 41 U/L   ALT 17 0 - 44 U/L   Alkaline Phosphatase 303 (H) 38 - 126 U/L   Total Bilirubin 2.1 (H) 0.3 - 1.2 mg/dL   GFR, Estimated 26 (L) >60 mL/min   Anion gap 9 5 - 15    Recent Results (from the past 240 hour(s))  Resp Panel by RT-PCR (Flu A&B, Covid) Nasopharyngeal Swab     Status: None   Collection Time: 07/13/21  6:05 PM   Specimen: Nasopharyngeal Swab; Nasopharyngeal(NP) swabs in vial transport medium  Result Value Ref Range Status   SARS Coronavirus 2 by RT PCR NEGATIVE NEGATIVE Final    Comment: (NOTE) SARS-CoV-2 target nucleic acids are NOT DETECTED.  The SARS-CoV-2 RNA is generally detectable in upper  respiratory specimens during the acute phase of infection. The lowest concentration of SARS-CoV-2 viral copies this assay can detect is 138 copies/mL. A negative result does not preclude SARS-Cov-2 infection and should not be used as the sole basis for treatment or other patient management decisions. A negative result may  occur with  improper specimen collection/handling, submission of specimen other than nasopharyngeal swab, presence of viral mutation(s) within the areas targeted by this assay, and inadequate number of viral copies(<138 copies/mL). A negative result must be combined with clinical observations, patient history, and epidemiological information. The expected result is Negative.  Fact Sheet for Patients:  EntrepreneurPulse.com.au  Fact Sheet for Healthcare Providers:  IncredibleEmployment.be  This test is no t yet approved or cleared by the [place] FDA and  has been authorized for detection and/or diagnosis of SARS-CoV-2 by FDA under an Emergency Use Authorization (EUA). This EUA will remain  in effect (meaning this test can be used) for the duration of the COVID-19 declaration under Section 564(b)(1) of the Act, 21 U.S.C.section 360bbb-3(b)(1), unless the authorization is terminated  or revoked sooner.       Influenza A by PCR NEGATIVE NEGATIVE Final   Influenza B by PCR NEGATIVE NEGATIVE Final    Comment: (NOTE) The Xpert Xpress SARS-CoV-2/FLU/RSV plus assay is intended as an aid in the diagnosis of influenza from Nasopharyngeal swab specimens and should not be used as a sole basis for treatment. Nasal washings and aspirates are unacceptable for Xpert Xpress SARS-CoV-2/FLU/RSV testing.  Fact Sheet for Patients: EntrepreneurPulse.com.au  Fact Sheet for Healthcare Providers: IncredibleEmployment.be  This test is not yet approved or cleared by the [place] FDA and has been  authorized for detection and/or diagnosis of SARS-CoV-2 by FDA under an Emergency Use Authorization (EUA). This EUA will remain in effect (meaning this test can be used) for the duration of the COVID-19 declaration under Section 564(b)(1) of the Act, 21 U.S.C. section 360bbb-3(b)(1), unless the authorization is terminated or revoked.  Performed at [facility], [place] [address], [place], [place] [address]   Aerobic/Anaerobic Culture w Gram Stain (surgical/deep wound)     Status: None   Collection Time: 07/16/21  4:20 PM   Specimen: Abscess  Result Value Ref Range Status   Specimen Description   Final    ABSCESS RIGHT ARM Performed at [place] [address] [place], [place] [address]    Special Requests   Final    NONE Performed at [facility], [place] [address] [place], [place] [address]    Gram Stain   Final    ABUNDANT WBC PRESENT,BOTH PMN AND MONONUCLEAR NO ORGANISMS SEEN    Culture   Final    No growth aerobically or anaerobically. Performed at [facility], [place] [address], [place], [place] [address]    Report Status 07/21/2021 FINAL  Final  Aerobic/Anaerobic Culture w Gram Stain (surgical/deep wound)     Status: None   Collection Time: 07/16/21  4:25 PM   Specimen: Joint, Right Shoulder; Synovial Fluid  Result Value Ref Range Status   Specimen Description   Final    JOINT FLUID RIGHT SHOULDER Performed at [place] [address], [place], [place] [address]    Special Requests   Final    NONE Performed at [facility], [place] [address], [place], [place] [address]    Gram Stain   Final    FEW WBC PRESENT, PREDOMINANTLY PMN NO ORGANISMS SEEN    Culture   Final    No growth aerobically or anaerobically. Performed at [facility], [place] [address] [place], [place] [address]    Report Status 07/21/2021 FINAL  Final  Resp Panel by RT-PCR (Flu A&B, Covid)  Nasopharyngeal Swab  Status: None   Collection Time: 07/17/21 11:13 AM   Specimen: Nasopharyngeal Swab; Nasopharyngeal(NP) swabs in vial transport medium  Result Value Ref Range Status   SARS Coronavirus 2 by RT PCR NEGATIVE NEGATIVE Final    Comment: (NOTE) SARS-CoV-2 target nucleic acids are NOT DETECTED.  The SARS-CoV-2 RNA is generally detectable in upper respiratory specimens during the acute phase of infection. The lowest concentration of SARS-CoV-2 viral copies this assay can detect is 138 copies/mL. A negative result does not preclude SARS-Cov-2 infection and should not be used as the sole basis for treatment or other patient management decisions. A negative result may occur with  improper specimen collection/handling, submission of specimen other than nasopharyngeal swab, presence of viral mutation(s) within the areas targeted by this assay, and inadequate number of viral copies(<138 copies/mL). A negative result must be combined with clinical observations, patient history, and epidemiological information. The expected result is Negative.  Fact Sheet for Patients:  EntrepreneurPulse.com.au  Fact Sheet for Healthcare Providers:  IncredibleEmployment.be  This test is no t yet approved or cleared by the [place] FDA and  has been authorized for detection and/or diagnosis of SARS-CoV-2 by FDA under an Emergency Use Authorization (EUA). This EUA will remain  in effect (meaning this test can be used) for the duration of the COVID-19 declaration under Section 564(b)(1) of the Act, 21 U.S.C.section 360bbb-3(b)(1), unless the authorization is terminated  or revoked sooner.       Influenza A by PCR NEGATIVE NEGATIVE Final   Influenza B by PCR NEGATIVE NEGATIVE Final    Comment: (NOTE) The Xpert Xpress SARS-CoV-2/FLU/RSV plus assay is intended as an aid in the diagnosis of influenza from Nasopharyngeal swab specimens and should not be  used as a sole basis for treatment. Nasal washings and aspirates are unacceptable for Xpert Xpress SARS-CoV-2/FLU/RSV testing.  Fact Sheet for Patients: EntrepreneurPulse.com.au  Fact Sheet for Healthcare Providers: IncredibleEmployment.be  This test is not yet approved or cleared by the [place] FDA and has been authorized for detection and/or diagnosis of SARS-CoV-2 by FDA under an Emergency Use Authorization (EUA). This EUA will remain in effect (meaning this test can be used) for the duration of the COVID-19 declaration under Section 564(b)(1) of the Act, 21 U.S.C. section 360bbb-3(b)(1), unless the authorization is terminated or revoked.  Performed at [facility], [place] [address], [place], [place] [address]      Radiology Studies: US Abdomen Limited RUQ (LIVER/GB)  Result Date: 07/21/2021 CLINICAL DATA:  Elevated INR, obesity EXAM: ULTRASOUND ABDOMEN LIMITED RIGHT UPPER QUADRANT COMPARISON:  01/05/2021 FINDINGS: Gallbladder: Echogenic material within the gallbladder compatible with sludge. No shadowing gallstones. No gallbladder wall thickening or pericholecystic fluid. Negative Murphy sign. Common bile duct: Diameter: 7 mm Liver: Coarsened liver echotexture with nodularity of the liver capsule compatible with cirrhosis. No focal parenchymal abnormalities. No intrahepatic duct dilation. Portal vein is patent on color Doppler imaging with normal direction of blood flow towards the liver. Other: None. IMPRESSION: 1. Gallbladder sludge. No evidence of cholelithiasis or cholecystitis. 2. Sonographic findings of cirrhosis. Electronically Signed   By: Randa Ngo M.D.   On: 07/21/2021 19:54   US Abdomen Limited RUQ (LIVER/GB)  Final Result    VAS [place] LOWER EXTREMITY VENOUS (DVT)  Final Result    CT SHOULDER RIGHT W CONTRAST  Final Result    IR US Guide Bx Asp/Drain  Final Result    IR DRAIN/INJ MAJOR JOINT/BURSA   Final Result    CT SHOULDER  RIGHT WO CONTRAST  Final Result    DG Shoulder Right  Final Result    VAS [place] UPPER EXTREMITY VENOUS DUPLEX  Final Result    CT LUMBAR SPINE WO CONTRAST  Final Result    CT PELVIS WO CONTRAST  Final Result    CT KNEE LEFT WO CONTRAST  Final Result    CT KNEE RIGHT WO CONTRAST  Final Result    DG Ankle 2 Views Left  Final Result    CT HEAD WO CONTRAST (5MM)  Final Result    DG Chest 2 View  Final Result    DG Knee Complete 4 Views Right  Final Result    DG Knee Complete 4 Views Left  Final Result      Scheduled Meds:  carvedilol  6.25 mg Oral BID WC   cholecalciferol  2,000 Units Oral Daily   exemestane  25 mg Oral QPC breakfast   lactulose  10 g Oral TID   levothyroxine  50 mcg Oral QAC breakfast   pantoprazole  40 mg Oral Daily   potassium chloride  20 mEq Oral Daily   pravastatin  20 mg Oral q1800   rifaximin  550 mg Oral BID   vitamin B-12  2,000 mcg Oral Daily   PRN Meds: sodium chloride, acetaminophen **OR** acetaminophen, diclofenac Sodium, lipase/protease/amylase, ondansetron **OR** ondansetron (ZOFRAN) IV, oxyCODONE, polyvinyl alcohol, simethicone, traMADol, witch hazel-glycerin Continuous Infusions:  sodium chloride 250 mL (07/16/21 1319)     LOS: 21 days  Time spent: Greater than 50% of the 35 minute visit was spent in counseling/coordination of care for the patient as laid out in the A&P.   Dwyane Dee, MD [facility] 07/23/2021, 3:01 PM

## 2021-07-23 NOTE — Progress Notes (Signed)
ORTHOPAEDIC PROGRESS NOTE  SUBJECTIVE: Conversation translated by patient's son via telephone. States her shoulder is fine at rest, but about a 7 with any movement.   OBJECTIVE: PE: General: resting in hospital bed, NAD, fatigued Right shoulder: bullae is still present, but surrounding erythema has improved. Tender to palpation about the shoulder. Continues to tolerate about 60 degrees passive forward elevation.Limited active forward elevation.   Vitals:   07/23/21 0555 07/23/21 0819  BP: (!) 131/94 121/64  Pulse: 66 78  Resp: 18   Temp: 98.7 F (37.1 C)   SpO2: 96%    BRIEF HISTORY: Patient has a complex right shoulder. Her shoulder had atypical degenerative changes with a scapular fracture that appeared chronic and essentially complete loss of the glenoid vault leaving the remnant of the scapula that was not unreconstructable.  Due to the atypical nature of her proximal humerus we felt that the differential diagnosis included infection versus cancer versus less likely a occult fracture that is gone on to malunion and AVN over time. She underwent a right shoulder hemiarthroplasty in May 2022 to try and improve her function. She did well after surgery until recently. She had a few falls and was admitted to the hospital on August 26th. At that time, she was complaining more of lower leg pain. All imaging was negative for occult fracture. Imaging of her right shoulder continued to show a chronic dislocation. She began to complain of more right shoulder pain. It was noted that she had a possible abscess on the distal aspect of her incision, which was not noted before her admission. This began to increase in size. Patient had ultrasound guided aspiration of right upper arm abscess and fluoro guided aspiration of right shoulder joint  on 07/16/2021 with IR. Was found to have purulent debris superficially and only a few mL of bloody fluid in the joint, per IR note. Cultures so far have been  negative.  She was found to be febrile even while on Vancomycin. Infectious Disease was consulted by primary team and is following.    ASSESSMENT: Katelyn Jackson is a 75 y.o. female  - s/p right shoulder hemiarthroplasty for unreconstructable shoulder in May 2022 - right shoulder abscess s/p aspiration on 07/16/2021 with IR - bilateral knee osteoarthritis - chronic bilateral leg lymphedema   PLAN: - Right shoulder: Patient's shoulder chronically appears dislocated due to her unreconstructable shoulder from previous right humerus and scapula fractures with end-stage degenerative changes in the joint. She had a right hemiarthroplasty in May 2022.   - s/p aspiration of superficial abscess and joint on 07/16/2021 with Interventional Radiology  - Cultures showed no growth aerobically or anaerobically - CT w contrast has been ordered by ID: anterior fluid collection has decreased in size, joint effusion unchanged in volume  Dr. Tommy Medal and Dr. Griffin Basil have discussed this case. Plan is to discontinue antibiotics. She will need to stay off Warfarin in hopes that her INR comes down. INR is currently 5.1, which is too high to safely proceed with any sort of surgical intervention. If she remains symptomatic and INR comes down, then we will proceed with surgical intervention - I&D and possible resection of prosthesis. If she improves clinically and is asymptomatic, no further treatment will be necessary.   INR has gone down today, currently 1.5. GI has been consulted and following. Kidney function is improving.   Clinically the patient looks better and the abscess on her shoulder has improved. However she still has significant pain with  any range of motion of the shoulder. I discussed with the patient and her son, via telephone, the options for treatment. We discussed close monitoring and surgical management. Surgery would consist of irrigation and debridement of the shoulder as well as a resection of the  prosthetic. Discussed the goal of the surgery would be to help with pain and prevent any source of infection in the future. We cannot predict how much function she would get out of the shoulder afterwards. The goal would be to improve her pain and remove a potential source for infection. We also discussed the risks of non-surgical management including worsening pain and symptoms and the need for surgical debridement in the future. Patient's son wishes to speak to his mother one on one about the options for treatment. Patient's son, [name], will be at the bedside tomorrow afternoon. We will continue the discussion tomorrow after the patient and son have discussed their goals and options tonight.   [name], PA-C 07/23/2021

## 2021-07-23 NOTE — Progress Notes (Signed)
[facility] Progress Note  Katelyn Jackson 75 y.o. 1946/01/06  CC:  Newly diagnosed cirrhosis with supratheraputic INR  Subjective: She has not had a bowel movement since Wednesday, some lower abdominal discomfort. Denies fever, chills, nausea, vomiting. Getting physical therapy.  ROS : Review of Systems  Constitutional:  Negative for chills and fever.  Respiratory:  Negative for cough and shortness of breath.   Cardiovascular:  Negative for chest pain.  Gastrointestinal:  Positive for abdominal pain and constipation. Negative for blood in stool, diarrhea, melena, nausea and vomiting.  Musculoskeletal:  Positive for joint pain and myalgias.     Objective: Vital signs in last 24 hours: Vitals:   07/23/21 0555 07/23/21 0819  BP: (!) 131/94 121/64  Pulse: 66 78  Resp: 18   Temp: 98.7 F (37.1 C)   SpO2: 96%     Physical Exam: Physical Exam Constitutional:      General: She is not in acute distress.    Appearance: She is obese.  Eyes:     General: No scleral icterus. Abdominal:     General: Bowel sounds are normal. There is no distension.     Palpations: Abdomen is soft.     Tenderness: There is abdominal tenderness (diffuse lower AB). There is no guarding or rebound.  Musculoskeletal:     Right lower leg: Edema present.     Left lower leg: Edema present.  Skin:    Coloration: Skin is not jaundiced.  Neurological:     General: No focal deficit present.     Comments: Patient with very minor asterixis.      Lab Results: Recent Labs    07/22/21 0501 07/23/21 0416  NA 133* 135  K 3.7 3.4*  CL 90* 93*  CO2 33* 33*  GLUCOSE 103* 90  BUN 42* 38*  CREATININE 2.24* 1.98*  CALCIUM 10.2 9.8  MG 2.8* 2.6*   Recent Labs    07/22/21 0501 07/23/21 0416  AST 22 22  ALT 14 17  ALKPHOS 320* 303*  BILITOT 2.0* 2.1*  PROT 6.9 6.3*  ALBUMIN 2.5* 2.3*   Recent Labs    07/22/21 0501 07/23/21 0416  WBC 6.2 5.2  NEUTROABS 4.0 3.0  HGB 10.2* 9.4*   HCT 32.2* 30.0*  MCV 94.7 95.5  PLT 259 278   Recent Labs    07/22/21 0501 07/23/21 0416  LABPROT 30.7* 18.0*  INR 2.9* 1.5*    Lab Results: Results for orders placed or performed during the hospital encounter of 07/01/21 (from the past 48 hour(s))  Gamma GT     Status: Abnormal   Collection Time: 07/21/21  1:10 PM  Result Value Ref Range   GGT 184 (H) 7 - 50 U/L    Comment: Performed at [facility], [place] [address] [place], [place] [address]  Protime-INR     Status: Abnormal   Collection Time: 07/22/21  5:01 AM  Result Value Ref Range   Prothrombin Time 30.7 (H) 11.4 - 15.2 seconds   INR 2.9 (H) 0.8 - 1.2    Comment: (NOTE) INR goal varies based on device and disease states. Performed at [facility], [place] [address] [place],  [address]   Basic metabolic panel     Status: Abnormal   Collection Time: 07/22/21  5:01 AM  Result Value Ref Range   Sodium 133 (L) 135 - 145 mmol/L   Potassium 3.7 3.5 - 5.1 mmol/L   Chloride 90 (L) 98 - 111 mmol/L  CO2 33 (H) 22 - 32 mmol/L   Glucose, Bld 103 (H) 70 - 99 mg/dL    Comment: Glucose reference range applies only to samples taken after fasting for at least 8 hours.   BUN 42 (H) 8 - 23 mg/dL   Creatinine, Ser 2.24 (H) 0.44 - 1.00 mg/dL   Calcium 10.2 8.9 - 10.3 mg/dL   GFR, Estimated 22 (L) >60 mL/min    Comment: (NOTE) Calculated using the CKD-EPI Creatinine Equation (2021)    Anion gap 10 5 - 15    Comment: Performed at [facility], [place] [address] [place], [place] [address]  CBC with Differential/Platelet     Status: Abnormal   Collection Time: 07/22/21  5:01 AM  Result Value Ref Range   WBC 6.2 4.0 - 10.5 K/uL   RBC 3.40 (L) 3.87 - 5.11 MIL/uL   Hemoglobin 10.2 (L) 12.0 - 15.0 g/dL   HCT 32.2 (L) 36.0 - 46.0 %   MCV 94.7 80.0 - 100.0 fL   MCH 30.0 26.0 - 34.0 pg   MCHC 31.7 30.0 - 36.0 g/dL   RDW 16.2 (H) 11.5 - 15.5 %   Platelets 259 150 - 400 K/uL    nRBC 0.0 0.0 - 0.2 %   Neutrophils Relative % 65 %   Neutro Abs 4.0 1.7 - 7.7 K/uL   Lymphocytes Relative 17 %   Lymphs Abs 1.1 0.7 - 4.0 K/uL   Monocytes Relative 11 %   Monocytes Absolute 0.7 0.1 - 1.0 K/uL   Eosinophils Relative 6 %   Eosinophils Absolute 0.4 0.0 - 0.5 K/uL   Basophils Relative 1 %   Basophils Absolute 0.1 0.0 - 0.1 K/uL   Immature Granulocytes 0 %   Abs Immature Granulocytes 0.02 0.00 - 0.07 K/uL    Comment: Performed at [facility], [place] [address], [place], [place] [address]  Magnesium     Status: Abnormal   Collection Time: 07/22/21  5:01 AM  Result Value Ref Range   Magnesium 2.8 (H) 1.7 - 2.4 mg/dL    Comment: Performed at [facility], [place] [address], [place], [place] [address]  Hepatic function panel     Status: Abnormal   Collection Time: 07/22/21  5:01 AM  Result Value Ref Range   Total Protein 6.9 6.5 - 8.1 g/dL   Albumin 2.5 (L) 3.5 - 5.0 g/dL   AST 22 15 - 41 U/L   ALT 14 0 - 44 U/L   Alkaline Phosphatase 320 (H) 38 - 126 U/L   Total Bilirubin 2.0 (H) 0.3 - 1.2 mg/dL   Bilirubin, Direct 0.9 (H) 0.0 - 0.2 mg/dL   Indirect Bilirubin 1.1 (H) 0.3 - 0.9 mg/dL    Comment: Performed at [facility], [place] [address], [place],  [address]  Ceruloplasmin     Status: None   Collection Time: 07/22/21  9:43 AM  Result Value Ref Range   Ceruloplasmin 32.5 19.0 - 39.0 mg/dL    Comment: (NOTE) Performed At: [facility] [place], [place] [id number] Rush Farmer MD [id number]   Alpha-1-antitrypsin     Status: Abnormal   Collection Time: 07/22/21  9:43 AM  Result Value Ref Range   A-1 Antitrypsin, Ser 269 (H) 101 - 187 mg/dL    Comment: (NOTE) Performed At: [facility] [address] [place], [place] [id number] Rush Farmer MD [id number]   HCV Ab Reflex to Quant PCR  Status: None   Collection Time: 07/22/21 11:05 AM  Result Value Ref Range    HCV Ab <0.1 0.0 - 0.9 s/co ratio    Comment: (NOTE) Performed At: [facility] [place], [place] [id number] Rush Farmer MD [id number]   Hepatitis B surface antigen     Status: None   Collection Time: 07/22/21 11:05 AM  Result Value Ref Range   Hepatitis B Surface Ag NON REACTIVE NON REACTIVE    Comment: Performed at [place] [address] [place], [place] [address]  Interpretation:     Status: None   Collection Time: 07/22/21 11:05 AM  Result Value Ref Range   HCV Interp 1: Comment     Comment: (NOTE) Negative Not infected with HCV, unless recent infection is suspected or other evidence exists to indicate HCV infection. Performed At: [facility] [place], [place] [id number] Rush Farmer MD [id number]   Protime-INR     Status: Abnormal   Collection Time: 07/23/21  4:16 AM  Result Value Ref Range   Prothrombin Time 18.0 (H) 11.4 - 15.2 seconds   INR 1.5 (H) 0.8 - 1.2    Comment: (NOTE) INR goal varies based on device and disease states. Performed at [facility], [place] [address] [place], [place] [address]   CBC with Differential/Platelet     Status: Abnormal   Collection Time: 07/23/21  4:16 AM  Result Value Ref Range   WBC 5.2 4.0 - 10.5 K/uL   RBC 3.14 (L) 3.87 - 5.11 MIL/uL   Hemoglobin 9.4 (L) 12.0 - 15.0 g/dL   HCT 30.0 (L) 36.0 - 46.0 %   MCV 95.5 80.0 - 100.0 fL   MCH 29.9 26.0 - 34.0 pg   MCHC 31.3 30.0 - 36.0 g/dL   RDW 16.3 (H) 11.5 - 15.5 %   Platelets 278 150 - 400 K/uL    Comment: Immature Platelet Fraction may be clinically indicated, consider ordering this additional test [id number]    nRBC 0.0 0.0 - 0.2 %   Neutrophils Relative % 57 %   Neutro Abs 3.0 1.7 - 7.7 K/uL   Lymphocytes Relative 20 %   Lymphs Abs 1.0 0.7 - 4.0 K/uL   Monocytes Relative 16 %   Monocytes Absolute 0.8 0.1 - 1.0 K/uL   Eosinophils Relative 6 %   Eosinophils Absolute 0.3 0.0 - 0.5 K/uL    Basophils Relative 1 %   Basophils Absolute 0.1 0.0 - 0.1 K/uL   Immature Granulocytes 0 %   Abs Immature Granulocytes 0.02 0.00 - 0.07 K/uL    Comment: Performed at [facility], [place] [address] [place], [place] [address]  Magnesium     Status: Abnormal   Collection Time: 07/23/21  4:16 AM  Result Value Ref Range   Magnesium 2.6 (H) 1.7 - 2.4 mg/dL    Comment: Performed at [facility], [place] [address], [place], [place] [address]  Comprehensive metabolic panel     Status: Abnormal   Collection Time: 07/23/21  4:16 AM  Result Value Ref Range   Sodium 135 135 - 145 mmol/L   Potassium 3.4 (L) 3.5 - 5.1 mmol/L   Chloride 93 (L) 98 - 111 mmol/L   CO2 33 (H) 22 - 32 mmol/L   Glucose, Bld 90 70 - 99 mg/dL    Comment: Glucose reference range applies only to samples taken after fasting for at least 8 hours.   BUN  38 (H) 8 - 23 mg/dL   Creatinine, Ser 1.98 (H) 0.44 - 1.00 mg/dL   Calcium 9.8 8.9 - 10.3 mg/dL   Total Protein 6.3 (L) 6.5 - 8.1 g/dL   Albumin 2.3 (L) 3.5 - 5.0 g/dL   AST 22 15 - 41 U/L   ALT 17 0 - 44 U/L   Alkaline Phosphatase 303 (H) 38 - 126 U/L   Total Bilirubin 2.1 (H) 0.3 - 1.2 mg/dL   GFR, Estimated 26 (L) >60 mL/min    Comment: (NOTE) Calculated using the CKD-EPI Creatinine Equation (2021)    Anion gap 9 5 - 15    Comment: Performed at [facility], [place] [address] [place], [place] [address]    Assessment/Plan: Cirrhosis on ultrasound likely NASH No ascites  No prior history of variceal bleeding. Negative hepatitis panel, normal ceruloplasmin, alpha-1 atnitrypsin Pending further labs but most likely NASH  Hepatic encephalopathy with aserixis on lactulose and Xifaxan responding well Still with constipation, will increase lactulose to TID  Supratherapeutic INR could be related to prior Coumadin use versus coagulopathy from underlying cirrhosis. INR 1.5 today, improved with two doses of vitamin K Can  use FFP preprocedure and intraoperatively if needed  [facility] will sign off.  Please contact us if we can be of any further assistance during this hospital stay.   Vladimir Crofts PA-C 07/23/2021, 11:09 AM  Contact #  [phone]

## 2021-07-24 DIAGNOSIS — L02413 Cutaneous abscess of right upper limb: Secondary | ICD-10-CM | POA: Diagnosis not present

## 2021-07-24 DIAGNOSIS — N179 Acute kidney failure, unspecified: Secondary | ICD-10-CM | POA: Diagnosis not present

## 2021-07-24 DIAGNOSIS — N1832 Chronic kidney disease, stage 3b: Secondary | ICD-10-CM | POA: Diagnosis not present

## 2021-07-24 LAB — COMPREHENSIVE METABOLIC PANEL
ALT: 15 U/L (ref 0–44)
AST: 20 U/L (ref 15–41)
Albumin: 2.5 g/dL — ABNORMAL LOW (ref 3.5–5.0)
Alkaline Phosphatase: 273 U/L — ABNORMAL HIGH (ref 38–126)
Anion gap: 10 (ref 5–15)
BUN: 32 mg/dL — ABNORMAL HIGH (ref 8–23)
CO2: 34 mmol/L — ABNORMAL HIGH (ref 22–32)
Calcium: 10.4 mg/dL — ABNORMAL HIGH (ref 8.9–10.3)
Chloride: 96 mmol/L — ABNORMAL LOW (ref 98–111)
Creatinine, Ser: 1.55 mg/dL — ABNORMAL HIGH (ref 0.44–1.00)
GFR, Estimated: 35 mL/min — ABNORMAL LOW (ref >60)
Glucose, Bld: 81 mg/dL (ref 70–99)
Potassium: 3.5 mmol/L (ref 3.5–5.1)
Sodium: 140 mmol/L (ref 135–145)
Total Bilirubin: 2.1 mg/dL — ABNORMAL HIGH (ref 0.3–1.2)
Total Protein: 6.5 g/dL (ref 6.5–8.1)

## 2021-07-24 LAB — CBC WITH DIFFERENTIAL/PLATELET
Abs Immature Granulocytes: 0.05 10*3/uL (ref 0.00–0.07)
Basophils Absolute: 0.1 10*3/uL (ref 0.0–0.1)
Basophils Relative: 1 %
Eosinophils Absolute: 0.3 10*3/uL (ref 0.0–0.5)
Eosinophils Relative: 5 %
HCT: 32.9 % — ABNORMAL LOW (ref 36.0–46.0)
Hemoglobin: 10.2 g/dL — ABNORMAL LOW (ref 12.0–15.0)
Immature Granulocytes: 1 %
Lymphocytes Relative: 20 %
Lymphs Abs: 1.2 10*3/uL (ref 0.7–4.0)
MCH: 29.6 pg (ref 26.0–34.0)
MCHC: 31 g/dL (ref 30.0–36.0)
MCV: 95.4 fL (ref 80.0–100.0)
Monocytes Absolute: 0.9 10*3/uL (ref 0.1–1.0)
Monocytes Relative: 16 %
Neutro Abs: 3.5 10*3/uL (ref 1.7–7.7)
Neutrophils Relative %: 57 %
Platelets: 256 10*3/uL (ref 150–400)
RBC: 3.45 MIL/uL — ABNORMAL LOW (ref 3.87–5.11)
RDW: 16.4 % — ABNORMAL HIGH (ref 11.5–15.5)
WBC: 6 10*3/uL (ref 4.0–10.5)
nRBC: 0 % (ref 0.0–0.2)

## 2021-07-24 LAB — PROTIME-INR
INR: 1.4 — ABNORMAL HIGH (ref 0.8–1.2)
Prothrombin Time: 17.1 seconds — ABNORMAL HIGH (ref 11.4–15.2)

## 2021-07-24 LAB — MAGNESIUM: Magnesium: 2.6 mg/dL — ABNORMAL HIGH (ref 1.7–2.4)

## 2021-07-24 NOTE — Progress Notes (Signed)
Progress Note    Katelyn Jackson   TOI:[id number]  DOB: 01-29-1946  DOA: 07/01/2021     22  PCP: Cassandria Anger, MD  Initial CC: Left-sided pain in ankle, knee, shoulder and falling at home  Hospital Course: Katelyn Jackson is a 75 yo female with PMH permanent Afib (on Coumadin), HTN, HLD, pacer in place, hx breast cancer, hypothyroidism, chronic bilateral lower extremity lymphedema, morbid obesity, recurrent falls, GERD, pulmonary hypertension who presented to the hospital with worsening weakness and falls at home.  She had endorsed pain in her left ankle, knee, and shoulder. She was initially treated for suspected cellulitis in her left leg due to some increased erythema noted on admission. She was also noted to have a wound over her right shoulder and underwent further work-up for this as well.  See below for further A&P.  Interval History:  No events overnight.  Did not sleep well last night and was rather sleepy this morning.  She aroused easily to talk with me via iPad interpreter but then wanted to go back to sleep. No changes in amount of pain in her right shoulder which continues. Still tender with palpation/movement.  ROS: Constitutional: negative for chills and fevers, Respiratory: negative for cough and wheezing, Cardiovascular: negative for chest pain, and Gastrointestinal: negative for abdominal pain  Assessment & Plan: * Abscess of right shoulder - s/p right shoulder hemiarthroplasty in May 2022 - s/p aspiration of shoulder on 07/16/21 but appears fluid too thick for likely a good sample and pus was noted during procedure - agree with ID this needs washout as aspiration will be ineffective; she has ongoing pain which is confounded some by her chronic degeneration and atrophy of rotator cuff and narrowing of subacromial space seen on CT - INR does respond to Vit K and after further workup appears elevated INR due to underlying cirrhosis with likely some synthetic  dysfunction of liver - we can give Vit K and FFP in order to bridge thru surgery (ongoing discussion with orthopedic surgery to coordinate timing); ESR elevated 75 and CRP elevated 11.4 - patient son also amendable to surgery; discussed on phone with him 9/16  Decompensated hepatic cirrhosis ([place]) - despite new diagnosis, she's had this a while; RUQ u/s on 9/14 shows nodular liver consistent with diagnosis; also some GB sludge but LFT pattern not consistent with obstruction  - she has had elevated INR despite coumadin washout with ongoing elevated LFTs which may have some confounding from underlying infection; ammonia also upper limit normal on 9/9 and she's responded well to lactulose (rifaximin also started per GI) - GI consulted for further assistance with evaluation - likely NAFLD; no history of etoh use that would explain - further autoimmune workup also commenced - follow up hepatitis panel as well  - renal function improved with IVF (was overdiuresis with torsemide)  Elevated INR - INR peaked at 9.9 after admission on 8/26 and she was given Vit K to help reverse  - last Coumadin dose 9/9  - see cirrhosis now as well - will give Vit K and FFP pending timing of hopeful shoulder washout  Chronic atrial fibrillation ([place]) - on Coumadin at home prior to admission - holding anticoagulation for now in setting of need for surgery - see INR workup as well  - given new diagnosis of cirrhosis, will transition her to Eliquis once resuming anticoagulation   Acute renal failure superimposed on stage 3b chronic kidney disease ([place]) - patient has history of CKD3b.  Baseline creat ~ 1.4 - 1.5, eGFR 31 - 41 - has been on torsemide 40 mg BID likely contributing to overdiuresis; not consistent with a hepatorenal syndrome at this time - torsemide stopped on 9/15 - s/p IVF resuscitation with good improvement in renal function - continue holding diuretic and will resume as needed   Acute metabolic  encephalopathy-resolved as of 07/23/2021 - Multifactorial and likely some hospital delirium contributing given extended length of stay - patient symptoms include occasional AMS - etiology considered due to toxic/metabolic - continue re-orienting as needed - treating infection - given recently elevated NH3 and now diagnosis consistent with cirrhosis, she is also started on lactulose and rifaximin   Cellulitis of left lower extremity-resolved as of 07/21/2021 - mild erythema on admission from media pic; now treated and resolved   History of pacemaker - stable; at risk for infection seeding without source control  - implanted at [facility] on 01/06/21: Hilliard pacemaker with model number of ASSURITY MRI PM 2272 and serial number of [id number]   Hypothyroidism - Continue Synthroid  GERD (gastroesophageal reflux disease) - Continue Protonix  Morbid obesity ([place]) - Complicates overall prognosis and care - Body mass index is 43.36 kg/m. - Weight Loss and Dietary Counseling given  Anemia of chronic disease - Baseline hemoglobin 9 to 10 g/dL -Currently at baseline and no signs of bleeding  Malignant neoplasm of upper-outer quadrant of left breast in female, estrogen receptor positive ([place]) - outpatient follow up as indicated   Lymphedema - Stable edema.  No further signs of cellulitis involving lower extremities  Essential hypertension - Continue Coreg  Acute respiratory failure with hypoxia ([facility])-resolved as of 07/21/2021 secondary to fluid overload,  acute on chronic systolic CHF, hypercapnia likely from OSA. Respiratory status much improved.  Patient had adequate diuresis with negative fluid balance. Continue supplemental oxygen to keep saturation above 94%. Diuretics changed to torsemide  - now on RA   Old records reviewed in assessment of this patient  Antimicrobials:   DVT prophylaxis: SCDs Start: 07/02/21 0948   Code Status:   Code Status: Full Code Family  Communication: son, Katelyn Jackson   Disposition Plan: Status is: Inpatient  Remains inpatient appropriate because:Ongoing diagnostic testing needed not appropriate for outpatient work up, IV treatments appropriate due to intensity of illness or inability to take PO, and Inpatient level of care appropriate due to severity of illness  Dispo: The patient is from: Home              Anticipated d/c is to: SNF once workup complete               Patient currently is not medically stable to d/c.   Difficult to place patient No  Risk of unplanned readmission score: Unplanned Admission- Pilot do not use: 37.14   Objective: Blood pressure (!) 131/53, pulse (!) 58, temperature 98.3 F (36.8 C), temperature source Oral, resp. rate 18, height 5' 1"  (1.549 m), weight 105 kg, SpO2 96 %.  Examination: General appearance: alert, cooperative, no distress, and morbidly obese Head: Normocephalic, without obvious abnormality, atraumatic Eyes:  EOMI Lungs: clear to auscultation bilaterally Heart: irregularly irregular rhythm and S1, S2 normal Abdomen:  obese, soft, NT, BS present Extremities:  right shoulder noted with fluctuant lesion; pain with passive ROM in shoulder; no erythema or edema noted. LE's show chronic lymphedema without erythema or signs of infection Skin: mobility and turgor normal Neurologic: Grossly normal  Consultants:  ID Ortho surgery GI  Procedures:  Data Reviewed: I have personally reviewed following labs and imaging studies Results for orders placed or performed during the hospital encounter of 07/01/21 (from the past 24 hour(s))  Protime-INR     Status: Abnormal   Collection Time: 07/24/21  5:17 AM  Result Value Ref Range   Prothrombin Time 17.1 (H) 11.4 - 15.2 seconds   INR 1.4 (H) 0.8 - 1.2  CBC with Differential/Platelet     Status: Abnormal   Collection Time: 07/24/21  5:17 AM  Result Value Ref Range   WBC 6.0 4.0 - 10.5 K/uL   RBC 3.45 (L) 3.87 - 5.11 MIL/uL    Hemoglobin 10.2 (L) 12.0 - 15.0 g/dL   HCT 32.9 (L) 36.0 - 46.0 %   MCV 95.4 80.0 - 100.0 fL   MCH 29.6 26.0 - 34.0 pg   MCHC 31.0 30.0 - 36.0 g/dL   RDW 16.4 (H) 11.5 - 15.5 %   Platelets 256 150 - 400 K/uL   nRBC 0.0 0.0 - 0.2 %   Neutrophils Relative % 57 %   Neutro Abs 3.5 1.7 - 7.7 K/uL   Lymphocytes Relative 20 %   Lymphs Abs 1.2 0.7 - 4.0 K/uL   Monocytes Relative 16 %   Monocytes Absolute 0.9 0.1 - 1.0 K/uL   Eosinophils Relative 5 %   Eosinophils Absolute 0.3 0.0 - 0.5 K/uL   Basophils Relative 1 %   Basophils Absolute 0.1 0.0 - 0.1 K/uL   Immature Granulocytes 1 %   Abs Immature Granulocytes 0.05 0.00 - 0.07 K/uL  Magnesium     Status: Abnormal   Collection Time: 07/24/21  5:17 AM  Result Value Ref Range   Magnesium 2.6 (H) 1.7 - 2.4 mg/dL  Comprehensive metabolic panel     Status: Abnormal   Collection Time: 07/24/21  5:17 AM  Result Value Ref Range   Sodium 140 135 - 145 mmol/L   Potassium 3.5 3.5 - 5.1 mmol/L   Chloride 96 (L) 98 - 111 mmol/L   CO2 34 (H) 22 - 32 mmol/L   Glucose, Bld 81 70 - 99 mg/dL   BUN 32 (H) 8 - 23 mg/dL   Creatinine, Ser 1.55 (H) 0.44 - 1.00 mg/dL   Calcium 10.4 (H) 8.9 - 10.3 mg/dL   Total Protein 6.5 6.5 - 8.1 g/dL   Albumin 2.5 (L) 3.5 - 5.0 g/dL   AST 20 15 - 41 U/L   ALT 15 0 - 44 U/L   Alkaline Phosphatase 273 (H) 38 - 126 U/L   Total Bilirubin 2.1 (H) 0.3 - 1.2 mg/dL   GFR, Estimated 35 (L) >60 mL/min   Anion gap 10 5 - 15    Recent Results (from the past 240 hour(s))  Aerobic/Anaerobic Culture w Gram Stain (surgical/deep wound)     Status: None   Collection Time: 07/16/21  4:20 PM   Specimen: Abscess  Result Value Ref Range Status   Specimen Description   Final    ABSCESS RIGHT ARM Performed at [facility], [address], [place], [place] [address]    Special Requests   Final    NONE Performed at [facility], [place] [facility] [place] [address] [place], [place] [address]    Gram Stain   Final     ABUNDANT WBC PRESENT,BOTH PMN AND MONONUCLEAR NO ORGANISMS SEEN    Culture   Final    No growth aerobically or anaerobically. Performed at [facility], [place] [address]  [place], [place] [address]    Report Status 07/21/2021 FINAL  Final  Aerobic/Anaerobic Culture w Gram Stain (surgical/deep wound)     Status: None   Collection Time: 07/16/21  4:25 PM   Specimen: Joint, Right Shoulder; Synovial Fluid  Result Value Ref Range Status   Specimen Description   Final    JOINT FLUID RIGHT SHOULDER Performed at [facility] [address] [place], [place] [address]    Special Requests   Final    NONE Performed at [facility], [place] [address], [place], [place] [address]    Gram Stain   Final    FEW WBC PRESENT, PREDOMINANTLY PMN NO ORGANISMS SEEN    Culture   Final    No growth aerobically or anaerobically. Performed at [facility], [place] [address], [place], [place] [address]    Report Status 07/21/2021 FINAL  Final  Resp Panel by RT-PCR (Flu A&B, Covid) Nasopharyngeal Swab     Status: None   Collection Time: 07/17/21 11:13 AM   Specimen: Nasopharyngeal Swab; Nasopharyngeal(NP) swabs in vial transport medium  Result Value Ref Range Status   SARS Coronavirus 2 by RT PCR NEGATIVE NEGATIVE Final    Comment: (NOTE) SARS-CoV-2 target nucleic acids are NOT DETECTED.  The SARS-CoV-2 RNA is generally detectable in upper respiratory specimens during the acute phase of infection. The lowest concentration of SARS-CoV-2 viral copies this assay can detect is 138 copies/mL. A negative result does not preclude SARS-Cov-2 infection and should not be used as the sole basis for treatment or other patient management decisions. A negative result may occur with  improper specimen collection/handling, submission of specimen other than nasopharyngeal swab, presence of viral mutation(s) within the areas targeted by this assay, and inadequate number of  viral copies(<138 copies/mL). A negative result must be combined with clinical observations, patient history, and epidemiological information. The expected result is Negative.  Fact Sheet for Patients:  EntrepreneurPulse.com.au  Fact Sheet for Healthcare Providers:  IncredibleEmployment.be  This test is no t yet approved or cleared by the [place] FDA and  has been authorized for detection and/or diagnosis of SARS-CoV-2 by FDA under an Emergency Use Authorization (EUA). This EUA will remain  in effect (meaning this test can be used) for the duration of the COVID-19 declaration under Section 564(b)(1) of the Act, 21 U.S.C.section 360bbb-3(b)(1), unless the authorization is terminated  or revoked sooner.       Influenza A by PCR NEGATIVE NEGATIVE Final   Influenza B by PCR NEGATIVE NEGATIVE Final    Comment: (NOTE) The Xpert Xpress SARS-CoV-2/FLU/RSV plus assay is intended as an aid in the diagnosis of influenza from Nasopharyngeal swab specimens and should not be used as a sole basis for treatment. Nasal washings and aspirates are unacceptable for Xpert Xpress SARS-CoV-2/FLU/RSV testing.  Fact Sheet for Patients: EntrepreneurPulse.com.au  Fact Sheet for Healthcare Providers: IncredibleEmployment.be  This test is not yet approved or cleared by the [place] FDA and has been authorized for detection and/or diagnosis of SARS-CoV-2 by FDA under an Emergency Use Authorization (EUA). This EUA will remain in effect (meaning this test can be used) for the duration of the COVID-19 declaration under Section 564(b)(1) of the Act, 21 U.S.C. section 360bbb-3(b)(1), unless the authorization is terminated or revoked.  Performed at [facility], [place] [address] [place], [place] [address]      Radiology Studies: No results found. US Abdomen Limited RUQ (LIVER/GB)  Final Result  VAS  [place] LOWER EXTREMITY VENOUS (DVT)  Final Result    CT SHOULDER RIGHT W CONTRAST  Final Result    IR US Guide Bx Asp/Drain  Final Result    IR DRAIN/INJ MAJOR JOINT/BURSA  Final Result    CT SHOULDER RIGHT WO CONTRAST  Final Result    DG Shoulder Right  Final Result    VAS [place] UPPER EXTREMITY VENOUS DUPLEX  Final Result    CT LUMBAR SPINE WO CONTRAST  Final Result    CT PELVIS WO CONTRAST  Final Result    CT KNEE LEFT WO CONTRAST  Final Result    CT KNEE RIGHT WO CONTRAST  Final Result    DG Ankle 2 Views Left  Final Result    CT HEAD WO CONTRAST (5MM)  Final Result    DG Chest 2 View  Final Result    DG Knee Complete 4 Views Right  Final Result    DG Knee Complete 4 Views Left  Final Result      Scheduled Meds:  carvedilol  6.25 mg Oral BID WC   cholecalciferol  2,000 Units Oral Daily   exemestane  25 mg Oral QPC breakfast   lactulose  10 g Oral TID   levothyroxine  50 mcg Oral QAC breakfast   pantoprazole  40 mg Oral Daily   potassium chloride  20 mEq Oral Daily   pravastatin  20 mg Oral q1800   rifaximin  550 mg Oral BID   vitamin B-12  2,000 mcg Oral Daily   PRN Meds: sodium chloride, acetaminophen **OR** acetaminophen, diclofenac Sodium, lipase/protease/amylase, ondansetron **OR** ondansetron (ZOFRAN) IV, oxyCODONE, polyvinyl alcohol, simethicone, traMADol, witch hazel-glycerin Continuous Infusions:  sodium chloride 250 mL (07/16/21 1319)     LOS: 22 days  Time spent: Greater than 50% of the 35 minute visit was spent in counseling/coordination of care for the patient as laid out in the A&P.   Dwyane Dee, MD [facility] 07/24/2021, 12:00 PM

## 2021-07-24 NOTE — Progress Notes (Signed)
ORTHOPAEDIC PROGRESS NOTE  SUBJECTIVE: Patient states she is tired today. Continues to state she feels "better"  OBJECTIVE: PE: General: resting in hospital bed, NAD Right shoulder: bullae is still present, but surrounding erythema has improved. Tender to palpation about the shoulder. Was able to use her left arm to passively forward elevate her right shoulder to about 60 degrees.    Vitals:   07/24/21 0442 07/24/21 1250  BP: (!) 131/53 135/69  Pulse: (!) 58 (!) 59  Resp: 18 19  Temp:  98.2 F (36.8 C)  SpO2: 96% 96%   BRIEF HISTORY: Patient has a complex right shoulder. Her shoulder had atypical degenerative changes with a scapular fracture that appeared chronic and essentially complete loss of the glenoid vault leaving the remnant of the scapula that was not unreconstructable.  Due to the atypical nature of her proximal humerus we felt that the differential diagnosis included infection versus cancer versus less likely a occult fracture that is gone on to malunion and AVN over time. She underwent a right shoulder hemiarthroplasty in May 2022 to try and improve her function. She did well after surgery until recently. She had a few falls and was admitted to the hospital on August 26th. At that time, she was complaining more of lower leg pain. All imaging was negative for occult fracture. Imaging of her right shoulder continued to show a chronic dislocation. She began to complain of more right shoulder pain. It was noted that she had a possible abscess on the distal aspect of her incision, which was not noted before her admission. This began to increase in size. Patient had ultrasound guided aspiration of right upper arm abscess and fluoro guided aspiration of right shoulder joint  on 07/16/2021 with IR. Was found to have purulent debris superficially and only a few mL of bloody fluid in the joint, per IR note. Cultures so far have been negative.  She was found to be febrile even while on  Vancomycin. Infectious Disease was consulted by primary team and is following.    ASSESSMENT: Katelyn Jackson is a 75 y.o. female  - s/p right shoulder hemiarthroplasty for unreconstructable shoulder in May 2022 - right shoulder abscess s/p aspiration on 07/16/2021 with IR - bilateral knee osteoarthritis - chronic bilateral leg lymphedema   PLAN: - Right shoulder: Patient's shoulder chronically appears dislocated due to her unreconstructable shoulder from previous right humerus and scapula fractures with end-stage degenerative changes in the joint. She had a right hemiarthroplasty in May 2022.   - s/p aspiration of superficial abscess and joint on 07/16/2021 with Interventional Radiology  - Cultures showed no growth aerobically or anaerobically - CT w contrast has been ordered by ID: anterior fluid collection has decreased in size, joint effusion unchanged in volume  Dr. Tommy Medal and Dr. Griffin Basil have discussed this case. Plan is to discontinue antibiotics. She will need to stay off Warfarin in hopes that her INR comes down. INR is currently 5.1, which is too high to safely proceed with any sort of surgical intervention. If she remains symptomatic and INR comes down, then we will proceed with surgical intervention - I&D and possible resection of prosthesis. If she improves clinically and is asymptomatic, no further treatment will be necessary.   Clinically the patient looks better and the abscess on her shoulder has improved. However she still has significant pain with any range of motion of the shoulder. I discussed with the patient and her son, via telephone, the options for treatment.  We discussed close monitoring and surgical management. Surgery would consist of irrigation and debridement of the shoulder as well as a resection of the prosthetic. Discussed the goal of the surgery would be to help with pain and prevent any source of infection in the future. We cannot predict how much function she  would get out of the shoulder afterwards. The goal would be to improve her pain and remove a potential source for infection. We also discussed the risks of non-surgical management including worsening pain and symptoms and the need for surgical debridement in the future.   Patient's son was not at bedside when I arrived today. I attempted to call him while I was at bedside, but he did not answer. Patient wanted to be left alone and sleep and did not want to discuss her shoulder. I called her son again later this evening. We again discussed surgery versus non-surgical management. And the risks for both. Patient's son wishes to proceed with surgery to try and improve her pain and hopefully prevent a possible source of infection from spreading to her pacemaker. Patient's son agreed with this. We were not able to have this conversation with his mother, since it was via telephone. We agreed to have a conversation with the patient, [name], and I tomorrow to finalize the plans to ensure the patient is in agreement. Possible surgical debridement and resection on Monday.   [name], PA-C 07/24/2021

## 2021-07-25 ENCOUNTER — Encounter (HOSPITAL_COMMUNITY): Payer: Self-pay | Admitting: Internal Medicine

## 2021-07-25 DIAGNOSIS — L02413 Cutaneous abscess of right upper limb: Secondary | ICD-10-CM | POA: Diagnosis not present

## 2021-07-25 DIAGNOSIS — K7460 Unspecified cirrhosis of liver: Secondary | ICD-10-CM | POA: Diagnosis not present

## 2021-07-25 DIAGNOSIS — K7290 Hepatic failure, unspecified without coma: Secondary | ICD-10-CM | POA: Diagnosis not present

## 2021-07-25 LAB — COMPREHENSIVE METABOLIC PANEL
ALT: 14 U/L (ref 0–44)
AST: 22 U/L (ref 15–41)
Albumin: 2.5 g/dL — ABNORMAL LOW (ref 3.5–5.0)
Alkaline Phosphatase: 273 U/L — ABNORMAL HIGH (ref 38–126)
Anion gap: 12 (ref 5–15)
BUN: 24 mg/dL — ABNORMAL HIGH (ref 8–23)
CO2: 31 mmol/L (ref 22–32)
Calcium: 10.3 mg/dL (ref 8.9–10.3)
Chloride: 94 mmol/L — ABNORMAL LOW (ref 98–111)
Creatinine, Ser: 1.51 mg/dL — ABNORMAL HIGH (ref 0.44–1.00)
GFR, Estimated: 36 mL/min — ABNORMAL LOW (ref >60)
Glucose, Bld: 95 mg/dL (ref 70–99)
Potassium: 3.9 mmol/L (ref 3.5–5.1)
Sodium: 137 mmol/L (ref 135–145)
Total Bilirubin: 2 mg/dL — ABNORMAL HIGH (ref 0.3–1.2)
Total Protein: 6.5 g/dL (ref 6.5–8.1)

## 2021-07-25 LAB — CBC WITH DIFFERENTIAL/PLATELET
Abs Immature Granulocytes: 0.11 10*3/uL — ABNORMAL HIGH (ref 0.00–0.07)
Basophils Absolute: 0.1 10*3/uL (ref 0.0–0.1)
Basophils Relative: 1 %
Eosinophils Absolute: 0.2 10*3/uL (ref 0.0–0.5)
Eosinophils Relative: 4 %
HCT: 33.2 % — ABNORMAL LOW (ref 36.0–46.0)
Hemoglobin: 10.3 g/dL — ABNORMAL LOW (ref 12.0–15.0)
Immature Granulocytes: 2 %
Lymphocytes Relative: 16 %
Lymphs Abs: 1 10*3/uL (ref 0.7–4.0)
MCH: 29.9 pg (ref 26.0–34.0)
MCHC: 31 g/dL (ref 30.0–36.0)
MCV: 96.2 fL (ref 80.0–100.0)
Monocytes Absolute: 0.8 10*3/uL (ref 0.1–1.0)
Monocytes Relative: 13 %
Neutro Abs: 3.9 10*3/uL (ref 1.7–7.7)
Neutrophils Relative %: 64 %
Platelets: 195 10*3/uL (ref 150–400)
RBC: 3.45 MIL/uL — ABNORMAL LOW (ref 3.87–5.11)
RDW: 16.6 % — ABNORMAL HIGH (ref 11.5–15.5)
WBC: 6 10*3/uL (ref 4.0–10.5)
nRBC: 0 % (ref 0.0–0.2)

## 2021-07-25 LAB — MAGNESIUM: Magnesium: 2.5 mg/dL — ABNORMAL HIGH (ref 1.7–2.4)

## 2021-07-25 LAB — PROTIME-INR
INR: 1.3 — ABNORMAL HIGH (ref 0.8–1.2)
Prothrombin Time: 16.4 seconds — ABNORMAL HIGH (ref 11.4–15.2)

## 2021-07-25 LAB — SURGICAL PCR SCREEN
MRSA, PCR: NEGATIVE
Staphylococcus aureus: NEGATIVE

## 2021-07-25 MED ORDER — MUPIROCIN 2 % EX OINT
1.0000 "application " | TOPICAL_OINTMENT | Freq: Two times a day (BID) | CUTANEOUS | Status: DC
  Administered 2021-07-26 – 2021-07-30 (×9): 1 via NASAL
  Filled 2021-07-25 (×2): qty 22

## 2021-07-25 NOTE — Progress Notes (Signed)
ORTHOPAEDIC PROGRESS NOTE  SUBJECTIVE: Patient states she is tired today. Continues to state she feels "better" Son, Katelyn Jackson, at bedside.   OBJECTIVE: PE: General: resting in hospital bed, NAD Right shoulder: bullae is still present, but surrounding erythema has improved. Tender to palpation about the shoulder. Was able to use her left arm to passively forward elevate her right shoulder to about 60 degrees.    Vitals:   07/25/21 0413 07/25/21 1216  BP: (!) 120/56 117/64  Pulse: (!) 55 62  Resp: 18 16  Temp: 99 F (37.2 C) 98.1 F (36.7 C)  SpO2: 97% 94%   BRIEF HISTORY: Patient has a complex right shoulder. Her shoulder had atypical degenerative changes with a scapular fracture that appeared chronic and essentially complete loss of the glenoid vault leaving the remnant of the scapula that was not unreconstructable.  Due to the atypical nature of her proximal humerus we felt that the differential diagnosis included infection versus cancer versus less likely a occult fracture that is gone on to malunion and AVN over time. She underwent a right shoulder hemiarthroplasty in May 2022 to try and improve her function. She did well after surgery until recently. She had a few falls and was admitted to the hospital on August 26th. At that time, she was complaining more of lower leg pain. All imaging was negative for occult fracture. Imaging of her right shoulder continued to show a chronic dislocation. She began to complain of more right shoulder pain. It was noted that she had a possible abscess on the distal aspect of her incision, which was not noted before her admission. This began to increase in size. Patient had ultrasound guided aspiration of right upper arm abscess and fluoro guided aspiration of right shoulder joint  on 07/16/2021 with IR. Was found to have purulent debris superficially and only a few mL of bloody fluid in the joint, per IR note. Cultures so far have been negative.  She was  found to be febrile even while on Vancomycin. Infectious Disease was consulted by primary team and is following.    ASSESSMENT: Katelyn Jackson is a 75 y.o. female  - s/p right shoulder hemiarthroplasty for unreconstructable shoulder in May 2022 - right shoulder abscess s/p aspiration on 07/16/2021 with IR - bilateral knee osteoarthritis - chronic bilateral leg lymphedema   PLAN: - Right shoulder: Patient's shoulder chronically appears dislocated due to her unreconstructable shoulder from previous right humerus and scapula fractures with end-stage degenerative changes in the joint. She had a right hemiarthroplasty in May 2022.   - s/p aspiration of superficial abscess and joint on 07/16/2021 with Interventional Radiology  - Cultures showed no growth aerobically or anaerobically - CT w contrast has been ordered by ID: anterior fluid collection has decreased in size, joint effusion unchanged in volume  Dr. Tommy Medal and Dr. Griffin Basil have discussed this case. Plan is to discontinue antibiotics. She will need to stay off Warfarin in hopes that her INR comes down. INR is currently 5.1, which is too high to safely proceed with any sort of surgical intervention. If she remains symptomatic and INR comes down, then we will proceed with surgical intervention - I&D and possible resection of prosthesis. If she improves clinically and is asymptomatic, no further treatment will be necessary.   Clinically the patient looks better and the abscess on her shoulder has improved. However she still has significant pain with any range of motion of the shoulder. I discussed with the patient and her  son, the options for treatment. We discussed close monitoring and surgical management. Surgery would consist of irrigation and debridement of the shoulder as well as a resection of the prosthetic. Discussed the goal of the surgery would be to help with pain and prevent any source of infection in the future. We cannot predict how  much function she would get out of the shoulder afterwards. The goal would be to improve her pain and remove a potential source for infection. We also discussed the risks of non-surgical management including worsening pain and symptoms and the need for surgical debridement in the future.   Patient's son at bedside today. The patient, her son Katelyn Jackson, and I again discussed surgery versus non-surgical management. And the risks for both. Patient's son wishes to proceed with surgery to try and improve her pain and hopefully prevent a possible source of infection from spreading to her pacemaker. We discussed after surgery she may have continued pain and limited mobility. We discussed The risks benefits and alternatives were discussed with the patient including but not limited to the risks of nonoperative treatment, versus surgical intervention including infection, bleeding, nerve injury, blood clots, cardiopulmonary complications, morbidity, mortality, among others, and they were willing to proceed.    Plan for irrigation and debridement with removal of hardware tomorrow, Monday 07/26/2021, with Dr. Griffin Basil depending on OR availability.  - NPO at midnight - We will take cultures intraoperatively  [name], PA-C 07/25/2021

## 2021-07-25 NOTE — Progress Notes (Signed)
Progress Note    Katelyn Jackson   WCH:[id number]  DOB: Feb 17, 1946  DOA: 07/01/2021     23  PCP: Katelyn Anger, MD  Initial CC: Left-sided pain in ankle, knee, shoulder and falling at home  Hospital Course: Katelyn Jackson is a 75 yo female with PMH permanent Afib (on Coumadin), HTN, HLD, pacer in place, hx breast cancer, hypothyroidism, chronic bilateral lower extremity lymphedema, morbid obesity, recurrent falls, GERD, pulmonary hypertension who presented to the hospital with worsening weakness and falls at home.  She had endorsed pain in her left ankle, knee, and shoulder. She was initially treated for suspected cellulitis in her left leg due to some increased erythema noted on admission. She was also noted to have a wound over her right shoulder and underwent further work-up for this as well.  See below for further A&P.  Interval History:  No events overnight. Resting in bed this morning. More awake today. She is aware her son is coming today to talk to her more about surgery.   ROS: Constitutional: negative for chills and fevers, Respiratory: negative for cough and wheezing, Cardiovascular: negative for chest pain, and Gastrointestinal: negative for abdominal pain  Assessment & Plan: * Abscess of right shoulder - s/p right shoulder hemiarthroplasty in May 2022 - s/p aspiration of shoulder on 07/16/21 but appears fluid too thick for likely a good sample and pus was noted during procedure - agree with ID this needs washout as aspiration will be ineffective; she has ongoing pain which is confounded some by her chronic degeneration and atrophy of rotator cuff and narrowing of subacromial space seen on CT - INR does respond to Vit K and after further workup appears elevated INR due to underlying cirrhosis with likely some synthetic dysfunction of liver - we can give Vit K and FFP in order to bridge thru surgery (ongoing discussion with orthopedic surgery to coordinate timing);  ESR elevated 75 and CRP elevated 11.4 - patient son also amendable to surgery; discussed on phone with him 9/16  Decompensated hepatic cirrhosis ([place]) - despite new diagnosis, she's had this a while; RUQ u/s on 9/14 shows nodular liver consistent with diagnosis; also some GB sludge but LFT pattern not consistent with obstruction  - she has had elevated INR despite coumadin washout with ongoing elevated LFTs which may have some confounding from underlying infection; ammonia also upper limit normal on 9/9 and she's responded well to lactulose (rifaximin also started per GI) - GI consulted for further assistance with evaluation - likely NAFLD; no history of etoh use that would explain - further autoimmune workup also commenced - follow up hepatitis panel as well  - renal function improved with IVF (was overdiuresis with torsemide)  Chronic atrial fibrillation ([place]) - on Coumadin at home prior to admission - holding anticoagulation for now in setting of need for surgery - see INR workup as well  - given new diagnosis of cirrhosis, will transition her to Eliquis once resuming anticoagulation   Elevated INR-resolved as of 07/25/2021 - INR peaked at 9.9 after admission on 8/26 and she was given Vit K to help reverse  - last Coumadin dose 9/9  - see cirrhosis now as well - can give Vit K and/or FFP pending timing of hopeful shoulder washout; currently INR remains <2  Acute renal failure superimposed on stage 3b chronic kidney disease ([place]) - patient has history of CKD3b. Baseline creat ~ 1.4 - 1.5, eGFR 31 - 41 - has been on torsemide 40 mg  BID likely contributing to overdiuresis; not consistent with a hepatorenal syndrome at this time - torsemide stopped on 9/15 - s/p IVF resuscitation with good improvement in renal function - continue holding diuretic and will resume as needed   Acute metabolic encephalopathy-resolved as of 07/23/2021 - Multifactorial and likely some hospital delirium  contributing given extended length of stay - patient symptoms include occasional AMS - etiology considered due to toxic/metabolic - continue re-orienting as needed - treating infection - given recently elevated NH3 and now diagnosis consistent with cirrhosis, she is also started on lactulose and rifaximin   Cellulitis of left lower extremity-resolved as of 07/21/2021 - mild erythema on admission from media pic; now treated and resolved   History of pacemaker - stable; at risk for infection seeding without source control  - implanted at [facility] on 01/06/21: [employer] pacemaker with model number of ASSURITY MRI PM 2272 and serial number of [id number]   Hypothyroidism - Continue Synthroid  GERD (gastroesophageal reflux disease) - Continue Protonix  Morbid obesity ([place]) - Complicates overall prognosis and care - Body mass index is 43.36 kg/m. - Weight Loss and Dietary Counseling given  Anemia of chronic disease - Baseline hemoglobin 9 to 10 g/dL -Currently at baseline and no signs of bleeding  Malignant neoplasm of upper-outer quadrant of left breast in female, estrogen receptor positive ([place]) - outpatient follow up as indicated   Lymphedema - Stable edema.  No further signs of cellulitis involving lower extremities  Essential hypertension - Continue Coreg  Acute respiratory failure with hypoxia ([facility])-resolved as of 07/21/2021 secondary to fluid overload,  acute on chronic systolic CHF, hypercapnia likely from OSA. Respiratory status much improved.  Patient had adequate diuresis with negative fluid balance. Continue supplemental oxygen to keep saturation above 94%. Diuretics changed to torsemide  - now on RA   Old records reviewed in assessment of this patient  Antimicrobials:   DVT prophylaxis: SCDs Start: 07/02/21 0948   Code Status:   Code Status: Full Code Family Communication: son, Katelyn Jackson   Disposition Plan: Status is: Inpatient  Remains inpatient appropriate  because:Ongoing diagnostic testing needed not appropriate for outpatient work up, IV treatments appropriate due to intensity of illness or inability to take PO, and Inpatient level of care appropriate due to severity of illness  Dispo: The patient is from: Home              Anticipated d/c is to: SNF once workup complete               Patient currently is not medically stable to d/c.   Difficult to place patient No  Risk of unplanned readmission score: Unplanned Admission- Pilot do not use: 37.62   Objective: Blood pressure 117/64, pulse 62, temperature 98.1 F (36.7 C), temperature source Oral, resp. rate 16, height _0  (1.549 m), weight 100.8 kg, SpO2 94 %.  Examination: General appearance: alert, cooperative, no distress, and morbidly obese Head: Normocephalic, without obvious abnormality, atraumatic Eyes:  EOMI Lungs: clear to auscultation bilaterally Heart: irregularly irregular rhythm and S1, S2 normal Abdomen:  obese, soft, NT, BS present Extremities:  right shoulder noted with fluctuant lesion; pain with passive ROM in shoulder; no erythema or edema noted. LE's show chronic lymphedema without erythema or signs of infection Skin: mobility and turgor normal Neurologic: Grossly normal  Consultants:  ID Ortho surgery GI  Procedures:    Data Reviewed: I have personally reviewed following labs and imaging studies Results for orders placed or performed  during the hospital encounter of 07/01/21 (from the past 24 hour(s))  Protime-INR     Status: Abnormal   Collection Time: 07/25/21  5:25 AM  Result Value Ref Range   Prothrombin Time 16.4 (H) 11.4 - 15.2 seconds   INR 1.3 (H) 0.8 - 1.2  CBC with Differential/Platelet     Status: Abnormal   Collection Time: 07/25/21  5:25 AM  Result Value Ref Range   WBC 6.0 4.0 - 10.5 K/uL   RBC 3.45 (L) 3.87 - 5.11 MIL/uL   Hemoglobin 10.3 (L) 12.0 - 15.0 g/dL   HCT 33.2 (L) 36.0 - 46.0 %   MCV 96.2 80.0 - 100.0 fL   MCH 29.9 26.0 -  34.0 pg   MCHC 31.0 30.0 - 36.0 g/dL   RDW 16.6 (H) 11.5 - 15.5 %   Platelets 195 150 - 400 K/uL   nRBC 0.0 0.0 - 0.2 %   Neutrophils Relative % 64 %   Neutro Abs 3.9 1.7 - 7.7 K/uL   Lymphocytes Relative 16 %   Lymphs Abs 1.0 0.7 - 4.0 K/uL   Monocytes Relative 13 %   Monocytes Absolute 0.8 0.1 - 1.0 K/uL   Eosinophils Relative 4 %   Eosinophils Absolute 0.2 0.0 - 0.5 K/uL   Basophils Relative 1 %   Basophils Absolute 0.1 0.0 - 0.1 K/uL   Immature Granulocytes 2 %   Abs Immature Granulocytes 0.11 (H) 0.00 - 0.07 K/uL  Magnesium     Status: Abnormal   Collection Time: 07/25/21  5:25 AM  Result Value Ref Range   Magnesium 2.5 (H) 1.7 - 2.4 mg/dL  Comprehensive metabolic panel     Status: Abnormal   Collection Time: 07/25/21  5:25 AM  Result Value Ref Range   Sodium 137 135 - 145 mmol/L   Potassium 3.9 3.5 - 5.1 mmol/L   Chloride 94 (L) 98 - 111 mmol/L   CO2 31 22 - 32 mmol/L   Glucose, Bld 95 70 - 99 mg/dL   BUN 24 (H) 8 - 23 mg/dL   Creatinine, Ser 1.51 (H) 0.44 - 1.00 mg/dL   Calcium 10.3 8.9 - 10.3 mg/dL   Total Protein 6.5 6.5 - 8.1 g/dL   Albumin 2.5 (L) 3.5 - 5.0 g/dL   AST 22 15 - 41 U/L   ALT 14 0 - 44 U/L   Alkaline Phosphatase 273 (H) 38 - 126 U/L   Total Bilirubin 2.0 (H) 0.3 - 1.2 mg/dL   GFR, Estimated 36 (L) >60 mL/min   Anion gap 12 5 - 15    Recent Results (from the past 240 hour(s))  Aerobic/Anaerobic Culture w Gram Stain (surgical/deep wound)     Status: None   Collection Time: 07/16/21  4:20 PM   Specimen: Abscess  Result Value Ref Range Status   Specimen Description   Final    ABSCESS RIGHT ARM Performed at [facility], [address], [place], [place] [address]    Special Requests   Final    NONE Performed at [facility], [place] [address] [place], [place] [address]    Gram Stain   Final    ABUNDANT WBC PRESENT,BOTH PMN AND MONONUCLEAR NO ORGANISMS SEEN    Culture   Final    No growth aerobically or  anaerobically. Performed at [facility], [place] [address] [place], [place] [address]    Report Status 07/21/2021 FINAL  Final  Aerobic/Anaerobic Culture w Gram Stain (  surgical/deep wound)     Status: None   Collection Time: 07/16/21  4:25 PM   Specimen: Joint, Right Shoulder; Synovial Fluid  Result Value Ref Range Status   Specimen Description   Final    JOINT FLUID RIGHT SHOULDER Performed at [facility] [address] [place], [place] [address]    Special Requests   Final    NONE Performed at [facility], [place] [address] [place], [place] [address]    Gram Stain   Final    FEW WBC PRESENT, PREDOMINANTLY PMN NO ORGANISMS SEEN    Culture   Final    No growth aerobically or anaerobically. Performed at [facility], [place] [address], [place], [place] [address]    Report Status 07/21/2021 FINAL  Final  Resp Panel by RT-PCR (Flu A&B, Covid) Nasopharyngeal Swab     Status: None   Collection Time: 07/17/21 11:13 AM   Specimen: Nasopharyngeal Swab; Nasopharyngeal(NP) swabs in vial transport medium  Result Value Ref Range Status   SARS Coronavirus 2 by RT PCR NEGATIVE NEGATIVE Final    Comment: (NOTE) SARS-CoV-2 target nucleic acids are NOT DETECTED.  The SARS-CoV-2 RNA is generally detectable in upper respiratory specimens during the acute phase of infection. The lowest concentration of SARS-CoV-2 viral copies this assay can detect is 138 copies/mL. A negative result does not preclude SARS-Cov-2 infection and should not be used as the sole basis for treatment or other patient management decisions. A negative result may occur with  improper specimen collection/handling, submission of specimen other than nasopharyngeal swab, presence of viral mutation(s) within the areas targeted by this assay, and inadequate number of viral copies(<138 copies/mL). A negative result must be combined with clinical observations, patient history, and  epidemiological information. The expected result is Negative.  Fact Sheet for Patients:  EntrepreneurPulse.com.au  Fact Sheet for Healthcare Providers:  IncredibleEmployment.be  This test is no t yet approved or cleared by the [place] FDA and  has been authorized for detection and/or diagnosis of SARS-CoV-2 by FDA under an Emergency Use Authorization (EUA). This EUA will remain  in effect (meaning this test can be used) for the duration of the COVID-19 declaration under Section 564(b)(1) of the Act, 21 U.S.C.section 360bbb-3(b)(1), unless the authorization is terminated  or revoked sooner.       Influenza A by PCR NEGATIVE NEGATIVE Final   Influenza B by PCR NEGATIVE NEGATIVE Final    Comment: (NOTE) The Xpert Xpress SARS-CoV-2/FLU/RSV plus assay is intended as an aid in the diagnosis of influenza from Nasopharyngeal swab specimens and should not be used as a sole basis for treatment. Nasal washings and aspirates are unacceptable for Xpert Xpress SARS-CoV-2/FLU/RSV testing.  Fact Sheet for Patients: EntrepreneurPulse.com.au  Fact Sheet for Healthcare Providers: IncredibleEmployment.be  This test is not yet approved or cleared by the [place] FDA and has been authorized for detection and/or diagnosis of SARS-CoV-2 by FDA under an Emergency Use Authorization (EUA). This EUA will remain in effect (meaning this test can be used) for the duration of the COVID-19 declaration under Section 564(b)(1) of the Act, 21 U.S.C. section 360bbb-3(b)(1), unless the authorization is terminated or revoked.  Performed at [facility], [place] [address] [place], [place] [address]      Radiology Studies: No results found. US Abdomen Limited RUQ (LIVER/GB)  Final Result    VAS [place] LOWER EXTREMITY VENOUS (DVT)  Final Result    CT SHOULDER RIGHT W CONTRAST  Final Result    IR US Guide Bx  Asp/Drain  Final Result    IR DRAIN/INJ MAJOR JOINT/BURSA  Final Result    CT SHOULDER RIGHT WO CONTRAST  Final Result    DG Shoulder Right  Final Result    VAS [place] UPPER EXTREMITY VENOUS DUPLEX  Final Result    CT LUMBAR SPINE WO CONTRAST  Final Result    CT PELVIS WO CONTRAST  Final Result    CT KNEE LEFT WO CONTRAST  Final Result    CT KNEE RIGHT WO CONTRAST  Final Result    DG Ankle 2 Views Left  Final Result    CT HEAD WO CONTRAST (5MM)  Final Result    DG Chest 2 View  Final Result    DG Knee Complete 4 Views Right  Final Result    DG Knee Complete 4 Views Left  Final Result      Scheduled Meds:  carvedilol  6.25 mg Oral BID WC   cholecalciferol  2,000 Units Oral Daily   exemestane  25 mg Oral QPC breakfast   lactulose  10 g Oral TID   levothyroxine  50 mcg Oral QAC breakfast   pantoprazole  40 mg Oral Daily   potassium chloride  20 mEq Oral Daily   pravastatin  20 mg Oral q1800   rifaximin  550 mg Oral BID   vitamin B-12  2,000 mcg Oral Daily   PRN Meds: sodium chloride, acetaminophen **OR** acetaminophen, diclofenac Sodium, lipase/protease/amylase, ondansetron **OR** ondansetron (ZOFRAN) IV, oxyCODONE, polyvinyl alcohol, simethicone, traMADol, witch hazel-glycerin Continuous Infusions:  sodium chloride 250 mL (07/16/21 1319)     LOS: 23 days  Time spent: Greater than 50% of the 35 minute visit was spent in counseling/coordination of care for the patient as laid out in the A&P.   Dwyane Dee, MD [facility] 07/25/2021, 1:12 PM

## 2021-07-25 NOTE — H&P (View-Only) (Signed)
ORTHOPAEDIC PROGRESS NOTE  SUBJECTIVE: Patient states she is tired today. Continues to state she feels "better" Son, Katelyn Jackson, at bedside.   OBJECTIVE: PE: General: resting in hospital bed, NAD Right shoulder: bullae is still present, but surrounding erythema has improved. Tender to palpation about the shoulder. Was able to use her left arm to passively forward elevate her right shoulder to about 60 degrees.    Vitals:   07/25/21 0413 07/25/21 1216  BP: (!) 120/56 117/64  Pulse: (!) 55 62  Resp: 18 16  Temp: 99 F (37.2 C) 98.1 F (36.7 C)  SpO2: 97% 94%   BRIEF HISTORY: Patient has a complex right shoulder. Her shoulder had atypical degenerative changes with a scapular fracture that appeared chronic and essentially complete loss of the glenoid vault leaving the remnant of the scapula that was not unreconstructable.  Due to the atypical nature of her proximal humerus we felt that the differential diagnosis included infection versus cancer versus less likely a occult fracture that is gone on to malunion and AVN over time. She underwent a right shoulder hemiarthroplasty in May 2022 to try and improve her function. She did well after surgery until recently. She had a few falls and was admitted to the hospital on August 26th. At that time, she was complaining more of lower leg pain. All imaging was negative for occult fracture. Imaging of her right shoulder continued to show a chronic dislocation. She began to complain of more right shoulder pain. It was noted that she had a possible abscess on the distal aspect of her incision, which was not noted before her admission. This began to increase in size. Patient had ultrasound guided aspiration of right upper arm abscess and fluoro guided aspiration of right shoulder joint  on 07/16/2021 with IR. Was found to have purulent debris superficially and only a few mL of bloody fluid in the joint, per IR note. Cultures so far have been negative.  She was  found to be febrile even while on Vancomycin. Infectious Disease was consulted by primary team and is following.    ASSESSMENT: Katelyn Jackson is a 75 y.o. female  - s/p right shoulder hemiarthroplasty for unreconstructable shoulder in May 2022 - right shoulder abscess s/p aspiration on 07/16/2021 with IR - bilateral knee osteoarthritis - chronic bilateral leg lymphedema   PLAN: - Right shoulder: Patient's shoulder chronically appears dislocated due to her unreconstructable shoulder from previous right humerus and scapula fractures with end-stage degenerative changes in the joint. She had a right hemiarthroplasty in May 2022.   - s/p aspiration of superficial abscess and joint on 07/16/2021 with Interventional Radiology  - Cultures showed no growth aerobically or anaerobically - CT w contrast has been ordered by ID: anterior fluid collection has decreased in size, joint effusion unchanged in volume  Dr. Tommy Medal and Dr. Griffin Basil have discussed this case. Plan is to discontinue antibiotics. She will need to stay off Warfarin in hopes that her INR comes down. INR is currently 5.1, which is too high to safely proceed with any sort of surgical intervention. If she remains symptomatic and INR comes down, then we will proceed with surgical intervention - I&D and possible resection of prosthesis. If she improves clinically and is asymptomatic, no further treatment will be necessary.   Clinically the patient looks better and the abscess on her shoulder has improved. However she still has significant pain with any range of motion of the shoulder. I discussed with the patient and her  son, the options for treatment. We discussed close monitoring and surgical management. Surgery would consist of irrigation and debridement of the shoulder as well as a resection of the prosthetic. Discussed the goal of the surgery would be to help with pain and prevent any source of infection in the future. We cannot predict how  much function she would get out of the shoulder afterwards. The goal would be to improve her pain and remove a potential source for infection. We also discussed the risks of non-surgical management including worsening pain and symptoms and the need for surgical debridement in the future.   Patient's son at bedside today. The patient, her son Katelyn Jackson, and I again discussed surgery versus non-surgical management. And the risks for both. Patient's son wishes to proceed with surgery to try and improve her pain and hopefully prevent a possible source of infection from spreading to her pacemaker. We discussed after surgery she may have continued pain and limited mobility. We discussed The risks benefits and alternatives were discussed with the patient including but not limited to the risks of nonoperative treatment, versus surgical intervention including infection, bleeding, nerve injury, blood clots, cardiopulmonary complications, morbidity, mortality, among others, and they were willing to proceed.    Plan for irrigation and debridement with removal of hardware tomorrow, Monday 07/26/2021, with Dr. Griffin Basil depending on OR availability.  - NPO at midnight - We will take cultures intraoperatively  [name], PA-C 07/25/2021

## 2021-07-26 ENCOUNTER — Inpatient Hospital Stay (HOSPITAL_COMMUNITY): Payer: Medicare HMO | Admitting: Certified Registered Nurse Anesthetist

## 2021-07-26 ENCOUNTER — Inpatient Hospital Stay (HOSPITAL_COMMUNITY): Payer: Medicare HMO

## 2021-07-26 ENCOUNTER — Encounter (HOSPITAL_COMMUNITY)
Admission: EM | Disposition: A | Discharge status: Skilled Nursing Facility | Payer: Self-pay | Source: Home / Self Care | Attending: Internal Medicine

## 2021-07-26 DIAGNOSIS — L02413 Cutaneous abscess of right upper limb: Secondary | ICD-10-CM | POA: Diagnosis not present

## 2021-07-26 HISTORY — PX: HARDWARE REMOVAL: SHX979

## 2021-07-26 LAB — COMPREHENSIVE METABOLIC PANEL
ALT: 12 U/L (ref 0–44)
AST: 18 U/L (ref 15–41)
Albumin: 2.3 g/dL — ABNORMAL LOW (ref 3.5–5.0)
Alkaline Phosphatase: 245 U/L — ABNORMAL HIGH (ref 38–126)
Anion gap: 7 (ref 5–15)
BUN: 20 mg/dL (ref 8–23)
CO2: 34 mmol/L — ABNORMAL HIGH (ref 22–32)
Calcium: 10.9 mg/dL — ABNORMAL HIGH (ref 8.9–10.3)
Chloride: 100 mmol/L (ref 98–111)
Creatinine, Ser: 1.43 mg/dL — ABNORMAL HIGH (ref 0.44–1.00)
GFR, Estimated: 38 mL/min — ABNORMAL LOW (ref >60)
Glucose, Bld: 88 mg/dL (ref 70–99)
Potassium: 3.7 mmol/L (ref 3.5–5.1)
Sodium: 141 mmol/L (ref 135–145)
Total Bilirubin: 2.2 mg/dL — ABNORMAL HIGH (ref 0.3–1.2)
Total Protein: 6.3 g/dL — ABNORMAL LOW (ref 6.5–8.1)

## 2021-07-26 LAB — CBC WITH DIFFERENTIAL/PLATELET
Abs Immature Granulocytes: 0.07 10*3/uL (ref 0.00–0.07)
Basophils Absolute: 0 10*3/uL (ref 0.0–0.1)
Basophils Relative: 1 %
Eosinophils Absolute: 0.2 10*3/uL (ref 0.0–0.5)
Eosinophils Relative: 5 %
HCT: 32.9 % — ABNORMAL LOW (ref 36.0–46.0)
Hemoglobin: 10.1 g/dL — ABNORMAL LOW (ref 12.0–15.0)
Immature Granulocytes: 1 %
Lymphocytes Relative: 25 %
Lymphs Abs: 1.3 10*3/uL (ref 0.7–4.0)
MCH: 29.8 pg (ref 26.0–34.0)
MCHC: 30.7 g/dL (ref 30.0–36.0)
MCV: 97.1 fL (ref 80.0–100.0)
Monocytes Absolute: 0.8 10*3/uL (ref 0.1–1.0)
Monocytes Relative: 16 %
Neutro Abs: 2.6 10*3/uL (ref 1.7–7.7)
Neutrophils Relative %: 52 %
Platelets: 201 10*3/uL (ref 150–400)
RBC: 3.39 MIL/uL — ABNORMAL LOW (ref 3.87–5.11)
RDW: 16.5 % — ABNORMAL HIGH (ref 11.5–15.5)
WBC: 4.9 10*3/uL (ref 4.0–10.5)
nRBC: 0 % (ref 0.0–0.2)

## 2021-07-26 LAB — MAGNESIUM: Magnesium: 2.4 mg/dL (ref 1.7–2.4)

## 2021-07-26 LAB — PROTIME-INR
INR: 1.4 — ABNORMAL HIGH (ref 0.8–1.2)
Prothrombin Time: 17.3 seconds — ABNORMAL HIGH (ref 11.4–15.2)

## 2021-07-26 SURGERY — IRRIGATION AND DEBRIDEMENT SHOULDER
Anesthesia: Choice | Site: Shoulder | Laterality: Right

## 2021-07-26 SURGERY — REMOVAL, HARDWARE
Anesthesia: General | Laterality: Right

## 2021-07-26 MED ORDER — LACTATED RINGERS IV SOLN: INTRAVENOUS | Status: DC

## 2021-07-26 MED ORDER — ONDANSETRON HCL 4 MG/2ML IJ SOLN
INTRAMUSCULAR | Status: DC | PRN
  Administered 2021-07-26: 4 mg via INTRAVENOUS

## 2021-07-26 MED ORDER — DOCUSATE SODIUM 100 MG PO CAPS
100.0000 mg | ORAL_CAPSULE | Freq: Two times a day (BID) | ORAL | Status: DC
  Administered 2021-07-26 – 2021-07-30 (×8): 100 mg via ORAL
  Filled 2021-07-26 (×8): qty 1

## 2021-07-26 MED ORDER — DEXAMETHASONE SODIUM PHOSPHATE 10 MG/ML IJ SOLN
INTRAMUSCULAR | Status: AC
  Filled 2021-07-26: qty 1

## 2021-07-26 MED ORDER — 0.9 % SODIUM CHLORIDE (POUR BTL) OPTIME
TOPICAL | Status: DC | PRN
  Administered 2021-07-26: 1000 mL

## 2021-07-26 MED ORDER — CEFAZOLIN SODIUM-DEXTROSE 2-4 GM/100ML-% IV SOLN
2.0000 g | INTRAVENOUS | Status: AC
  Administered 2021-07-26: 2 g via INTRAVENOUS

## 2021-07-26 MED ORDER — BUPIVACAINE LIPOSOME 1.3 % IJ SUSP
INTRAMUSCULAR | Status: DC | PRN
  Administered 2021-07-26: 10 mL via PERINEURAL

## 2021-07-26 MED ORDER — LIDOCAINE HCL (PF) 2 % IJ SOLN
INTRAMUSCULAR | Status: AC
  Filled 2021-07-26: qty 5

## 2021-07-26 MED ORDER — FENTANYL CITRATE (PF) 100 MCG/2ML IJ SOLN
INTRAMUSCULAR | Status: DC | PRN
  Administered 2021-07-26 (×2): 25 ug via INTRAVENOUS

## 2021-07-26 MED ORDER — ONDANSETRON HCL 4 MG/2ML IJ SOLN: 4.0000 mg | Freq: Once | INTRAMUSCULAR | Status: DC | PRN

## 2021-07-26 MED ORDER — SODIUM CHLORIDE 0.9 % IR SOLN
Status: DC | PRN
  Administered 2021-07-26: 6000 mL

## 2021-07-26 MED ORDER — VASOPRESSIN 20 UNIT/ML IV SOLN
INTRAVENOUS | Status: AC
  Filled 2021-07-26: qty 1

## 2021-07-26 MED ORDER — ACETAMINOPHEN 10 MG/ML IV SOLN: 1000.0000 mg | Freq: Once | INTRAVENOUS | Status: DC | PRN

## 2021-07-26 MED ORDER — PROPOFOL 10 MG/ML IV BOLUS
INTRAVENOUS | Status: DC | PRN
  Administered 2021-07-26: 100 mg via INTRAVENOUS

## 2021-07-26 MED ORDER — ENSURE PRE-SURGERY PO LIQD
296.0000 mL | Freq: Once | ORAL | Status: AC
  Administered 2021-07-26: 296 mL via ORAL
  Filled 2021-07-26: qty 296

## 2021-07-26 MED ORDER — FENTANYL CITRATE PF 50 MCG/ML IJ SOSY: 25.0000 ug | PREFILLED_SYRINGE | INTRAMUSCULAR | Status: DC | PRN

## 2021-07-26 MED ORDER — PROPOFOL 10 MG/ML IV BOLUS
INTRAVENOUS | Status: AC
  Filled 2021-07-26: qty 20

## 2021-07-26 MED ORDER — HYDROMORPHONE HCL 1 MG/ML IJ SOLN: 0.5000 mg | INTRAMUSCULAR | Status: DC | PRN

## 2021-07-26 MED ORDER — DIPHENHYDRAMINE HCL 12.5 MG/5ML PO ELIX: 12.5000 mg | ORAL_SOLUTION | ORAL | Status: DC | PRN

## 2021-07-26 MED ORDER — OXYCODONE HCL 5 MG PO TABS
10.0000 mg | ORAL_TABLET | Freq: Four times a day (QID) | ORAL | Status: DC | PRN
  Administered 2021-07-26 – 2021-07-29 (×7): 10 mg via ORAL
  Filled 2021-07-26 (×7): qty 2

## 2021-07-26 MED ORDER — MENTHOL 3 MG MT LOZG: 1.0000 | LOZENGE | OROMUCOSAL | Status: DC | PRN

## 2021-07-26 MED ORDER — STERILE WATER FOR IRRIGATION IR SOLN
Status: DC | PRN
  Administered 2021-07-26: 2000 mL

## 2021-07-26 MED ORDER — BUPIVACAINE HCL (PF) 0.5 % IJ SOLN
INTRAMUSCULAR | Status: DC | PRN
  Administered 2021-07-26: 12 mL via PERINEURAL

## 2021-07-26 MED ORDER — LIDOCAINE 2% (20 MG/ML) 5 ML SYRINGE
INTRAMUSCULAR | Status: DC | PRN
  Administered 2021-07-26: 100 mg via INTRAVENOUS

## 2021-07-26 MED ORDER — TOBRAMYCIN SULFATE 1.2 G IJ SOLR
INTRAMUSCULAR | Status: DC | PRN
  Administered 2021-07-26: 1.2 g via TOPICAL

## 2021-07-26 MED ORDER — DEXAMETHASONE SODIUM PHOSPHATE 10 MG/ML IJ SOLN
INTRAMUSCULAR | Status: DC | PRN
  Administered 2021-07-26: 5 mg via INTRAVENOUS

## 2021-07-26 MED ORDER — CEFAZOLIN SODIUM-DEXTROSE 2-4 GM/100ML-% IV SOLN
INTRAVENOUS | Status: AC
  Filled 2021-07-26: qty 100

## 2021-07-26 MED ORDER — FENTANYL CITRATE (PF) 100 MCG/2ML IJ SOLN
INTRAMUSCULAR | Status: AC
  Filled 2021-07-26: qty 2

## 2021-07-26 MED ORDER — VANCOMYCIN HCL 1000 MG IV SOLR
INTRAVENOUS | Status: DC | PRN
  Administered 2021-07-26: 1000 mg via TOPICAL

## 2021-07-26 MED ORDER — BUPIVACAINE HCL (PF) 0.5 % IJ SOLN
INTRAMUSCULAR | Status: AC
  Filled 2021-07-26: qty 30

## 2021-07-26 MED ORDER — SUGAMMADEX SODIUM 500 MG/5ML IV SOLN
INTRAVENOUS | Status: DC | PRN
  Administered 2021-07-26: 300 mg via INTRAVENOUS

## 2021-07-26 MED ORDER — TOBRAMYCIN SULFATE 1.2 G IJ SOLR
INTRAMUSCULAR | Status: AC
  Filled 2021-07-26: qty 1.2

## 2021-07-26 MED ORDER — PHENOL 1.4 % MT LIQD: 1.0000 | OROMUCOSAL | Status: DC | PRN

## 2021-07-26 MED ORDER — FENTANYL CITRATE PF 50 MCG/ML IJ SOSY
100.0000 ug | PREFILLED_SYRINGE | INTRAMUSCULAR | Status: AC
  Administered 2021-07-26: 25 ug via INTRAVENOUS
  Filled 2021-07-26: qty 2

## 2021-07-26 MED ORDER — ONDANSETRON HCL 4 MG PO TABS: 4.0000 mg | ORAL_TABLET | Freq: Four times a day (QID) | ORAL | Status: DC | PRN

## 2021-07-26 MED ORDER — ONDANSETRON HCL 4 MG/2ML IJ SOLN
INTRAMUSCULAR | Status: AC
  Filled 2021-07-26: qty 2

## 2021-07-26 MED ORDER — CEFAZOLIN SODIUM-DEXTROSE 1-4 GM/50ML-% IV SOLN
1.0000 g | Freq: Four times a day (QID) | INTRAVENOUS | Status: AC
  Administered 2021-07-26 – 2021-07-27 (×3): 1 g via INTRAVENOUS
  Filled 2021-07-26 (×3): qty 50

## 2021-07-26 MED ORDER — VASOPRESSIN 20 UNIT/ML IV SOLN
INTRAVENOUS | Status: DC | PRN
  Administered 2021-07-26 (×4): 1 [IU] via INTRAVENOUS

## 2021-07-26 MED ORDER — ROCURONIUM BROMIDE 10 MG/ML (PF) SYRINGE
PREFILLED_SYRINGE | INTRAVENOUS | Status: DC | PRN
  Administered 2021-07-26: 70 mg via INTRAVENOUS

## 2021-07-26 MED ORDER — VANCOMYCIN HCL 1000 MG IV SOLR
INTRAVENOUS | Status: AC
  Filled 2021-07-26: qty 20

## 2021-07-26 MED ORDER — OXYCODONE HCL 5 MG PO TABS
5.0000 mg | ORAL_TABLET | ORAL | Status: DC | PRN
  Administered 2021-07-27 – 2021-07-30 (×2): 5 mg via ORAL
  Filled 2021-07-26 (×2): qty 1

## 2021-07-26 MED ORDER — POVIDONE-IODINE 10 % EX SWAB: 2.0000 "application " | Freq: Once | CUTANEOUS | Status: DC

## 2021-07-26 MED ORDER — ONDANSETRON HCL 4 MG/2ML IJ SOLN: 4.0000 mg | Freq: Four times a day (QID) | INTRAMUSCULAR | Status: DC | PRN

## 2021-07-26 SURGICAL SUPPLY — 54 items
BAG COUNTER SPONGE SURGICOUNT (BAG) IMPLANT
BLADE SURG SZ10 CARB STEEL (BLADE) ×2 IMPLANT
BNDG ELASTIC 4X5.8 VLCR STR LF (GAUZE/BANDAGES/DRESSINGS) ×2 IMPLANT
BNDG ELASTIC 6X5.8 VLCR STR LF (GAUZE/BANDAGES/DRESSINGS) ×2 IMPLANT
BNDG ESMARK 4X9 LF (GAUZE/BANDAGES/DRESSINGS) IMPLANT
CHLORAPREP W/TINT 26 (MISCELLANEOUS) ×2 IMPLANT
CUFF TOURN SGL QUICK 24 (TOURNIQUET CUFF)
CUFF TOURN SGL QUICK 34 (TOURNIQUET CUFF)
CUFF TRNQT CYL 24X4X16.5-23 (TOURNIQUET CUFF) IMPLANT
CUFF TRNQT CYL 34X4.125X (TOURNIQUET CUFF) IMPLANT
DRAPE C-ARM 42X120 X-RAY (DRAPES) ×2 IMPLANT
DRAPE ORTHO SPLIT 77X108 STRL (DRAPES) ×2
DRAPE POUCH INSTRU U-SHP 10X18 (DRAPES) IMPLANT
DRAPE SURG ORHT 6 SPLT 77X108 (DRAPES) ×2 IMPLANT
DRAPE TOP 10253 STERILE (DRAPES) ×2 IMPLANT
DRESSING PEEL AND PLC PRVNA 13 (GAUZE/BANDAGES/DRESSINGS) IMPLANT
DRSG AQUACEL AG ADV 3.5X 4 (GAUZE/BANDAGES/DRESSINGS) ×4 IMPLANT
DRSG AQUACEL AG ADV 3.5X 6 (GAUZE/BANDAGES/DRESSINGS) ×2 IMPLANT
DRSG PEEL AND PLACE PREVENA 13 (GAUZE/BANDAGES/DRESSINGS) ×2
ELECT BLADE TIP CTD 4 INCH (ELECTRODE) ×1 IMPLANT
ELECT REM PT RETURN 15FT ADLT (MISCELLANEOUS) ×2 IMPLANT
EVACUATOR 1/8 PVC DRAIN (DRAIN) ×1 IMPLANT
GLOVE SRG 8 PF TXTR STRL LF DI (GLOVE) ×1 IMPLANT
GLOVE SURG ENC MOIS LTX SZ6.5 (GLOVE) ×4 IMPLANT
GLOVE SURG NEOPR MICRO LF SZ8 (GLOVE) ×4 IMPLANT
GLOVE SURG UNDER POLY LF SZ6.5 (GLOVE) ×2 IMPLANT
GLOVE SURG UNDER POLY LF SZ8 (GLOVE) ×1
GOWN STRL REUS W/ TWL LRG LVL3 (GOWN DISPOSABLE) ×1 IMPLANT
GOWN STRL REUS W/TWL LRG LVL3 (GOWN DISPOSABLE) ×1
GOWN STRL REUS W/TWL XL LVL3 (GOWN DISPOSABLE) ×2 IMPLANT
HANDPIECE INTERPULSE COAX TIP (DISPOSABLE) ×1
KIT BASIN OR (CUSTOM PROCEDURE TRAY) ×2 IMPLANT
KIT DRSG PREVENA PLUS 7DAY 125 (MISCELLANEOUS) ×1 IMPLANT
KIT STABILIZATION SHOULDER (MISCELLANEOUS) ×2 IMPLANT
LOOP VESSEL MAXI BLUE (MISCELLANEOUS) IMPLANT
NS IRRIG 1000ML POUR BTL (IV SOLUTION) ×2 IMPLANT
PACK SHOULDER (CUSTOM PROCEDURE TRAY) ×2 IMPLANT
SET HNDPC FAN SPRY TIP SCT (DISPOSABLE) IMPLANT
SLEEVE SCD COMPRESS KNEE MED (STOCKING) IMPLANT
SLING ARM IMMOBILIZER XL (CAST SUPPLIES) ×1 IMPLANT
SPLINT CAST 1 STEP 5X30 WHT (MISCELLANEOUS) ×40 IMPLANT
STRIP CLOSURE SKIN 1/2X4 (GAUZE/BANDAGES/DRESSINGS) ×2 IMPLANT
SUCTION FRAZIER HANDLE 10FR (MISCELLANEOUS) ×1
SUCTION TUBE FRAZIER 10FR DISP (MISCELLANEOUS) ×1 IMPLANT
SUT ETHILON 2 0 PS N (SUTURE) IMPLANT
SUT MNCRL AB 4-0 PS2 18 (SUTURE) ×2 IMPLANT
SUT MON AB 3-0 SH 27 (SUTURE) ×1
SUT MON AB 3-0 SH27 (SUTURE) ×1 IMPLANT
SUT VIC AB 0 CT1 27 (SUTURE) ×1
SUT VIC AB 0 CT1 27XBRD ANBCTR (SUTURE) ×1 IMPLANT
SUT VIC AB 3-0 SH 27 (SUTURE) ×1
SUT VIC AB 3-0 SH 27X BRD (SUTURE) ×1 IMPLANT
TOWEL OR 17X26 10 PK STRL BLUE (TOWEL DISPOSABLE) ×2 IMPLANT
TUBE SUCTION HIGH CAP CLEAR NV (SUCTIONS) ×2 IMPLANT

## 2021-07-26 NOTE — Interval H&P Note (Signed)
All questions answered, patient wants to proceed with procedure. ? ?

## 2021-07-26 NOTE — Progress Notes (Signed)
Progress Note    Katelyn Jackson   OMB:[id number]  DOB: Apr 05, 1946  DOA: 07/01/2021     24  PCP: Cassandria Anger, MD  Initial CC: Left-sided pain in ankle, knee, shoulder and falling at home  Hospital Course: Ms. Battaglini is a 75 yo female with PMH permanent Afib (on Coumadin), HTN, HLD, pacer in place, hx breast cancer, hypothyroidism, chronic bilateral lower extremity lymphedema, morbid obesity, recurrent falls, GERD, pulmonary hypertension who presented to the hospital with worsening weakness and falls at home.  She had endorsed pain in her left ankle, knee, and shoulder. She was initially treated for suspected cellulitis in her left leg due to some increased erythema noted on admission. She was also noted to have a wound over her right shoulder and underwent further work-up for this as well.  See below for further A&P.  Interval History:  No events overnight. Undergoing shoulder surgery today. Seen in her room before surgery, she understood plan and had no questions.  Interpreter used as usual.  ROS: Constitutional: negative for chills and fevers, Respiratory: negative for cough and wheezing, Cardiovascular: negative for chest pain, and Gastrointestinal: negative for abdominal pain  Assessment & Plan: * Abscess of right shoulder - s/p right shoulder hemiarthroplasty in May 2022 - s/p aspiration of shoulder on 07/16/21 but appears fluid too thick for likely a good sample and pus was noted during procedure - agree with ID this needs washout as aspiration will be ineffective; she has ongoing pain which is confounded some by her chronic degeneration and atrophy of rotator cuff and narrowing of subacromial space seen on CT - INR does respond to Vit K and after further workup appears elevated INR due to underlying cirrhosis with likely some synthetic dysfunction of liver - undergoing shoulder washout and hardware removal on 9/19; follow up results  Decompensated hepatic  cirrhosis ([place]) - despite new diagnosis, she's had this a while; RUQ u/s on 9/14 shows nodular liver consistent with diagnosis; also some GB sludge but LFT pattern not consistent with obstruction  - she has had elevated INR despite coumadin washout with ongoing elevated LFTs which may have some confounding from underlying infection; ammonia also upper limit normal on 9/9 and she's responded well to lactulose (rifaximin also started per GI) - GI consulted for further assistance with evaluation - likely NAFLD; no history of etoh use that would explain - further autoimmune workup also commenced - follow up hepatitis panel as well  - renal function improved with IVF (was overdiuresis with torsemide)  Chronic atrial fibrillation ([place]) - on Coumadin at home prior to admission - holding anticoagulation for now in setting of need for surgery - see INR workup as well  - given new diagnosis of cirrhosis, will transition her to Eliquis once resuming anticoagulation   Elevated INR-resolved as of 07/25/2021 - INR peaked at 9.9 after admission on 8/26 and she was given Vit K to help reverse  - last Coumadin dose 9/9  - see cirrhosis now as well - can give Vit K and/or FFP pending timing of hopeful shoulder washout; currently INR remains <2  Acute renal failure superimposed on stage 3b chronic kidney disease ([place]) - patient has history of CKD3b. Baseline creat ~ 1.4 - 1.5, eGFR 31 - 41 - has been on torsemide 40 mg BID likely contributing to overdiuresis; not consistent with a hepatorenal syndrome at this time - torsemide stopped on 9/15 - s/p IVF resuscitation with good improvement in renal function - continue holding  diuretic and will resume as needed   Acute metabolic encephalopathy-resolved as of 07/23/2021 - Multifactorial and likely some hospital delirium contributing given extended length of stay - patient symptoms include occasional AMS - etiology considered due to toxic/metabolic - continue  re-orienting as needed - treating infection - given recently elevated NH3 and now diagnosis consistent with cirrhosis, she is also started on lactulose and rifaximin   Cellulitis of left lower extremity-resolved as of 07/21/2021 - mild erythema on admission from media pic; now treated and resolved   History of pacemaker - stable; at risk for infection seeding without source control  - implanted at [facility] on 01/06/21: Mc[employer]ride pacemaker with model number of ASSURITY MRI PM 2272 and serial number of [id number]   Hypothyroidism - Continue Synthroid  GERD (gastroesophageal reflux disease) - Continue Protonix  Morbid obesity ([place]) - Complicates overall prognosis and care - Body mass index is 43.36 kg/m. - Weight Loss and Dietary Counseling given  Anemia of chronic disease - Baseline hemoglobin 9 to 10 g/dL -Currently at baseline and no signs of bleeding  Malignant neoplasm of upper-outer quadrant of left breast in female, estrogen receptor positive ([place]) - outpatient follow up as indicated   Lymphedema - Stable edema.  No further signs of cellulitis involving lower extremities  Essential hypertension - Continue Coreg  Acute respiratory failure with hypoxia ([facility])-resolved as of 07/21/2021 secondary to fluid overload,  acute on chronic systolic CHF, hypercapnia likely from OSA. Respiratory status much improved.  Patient had adequate diuresis with negative fluid balance. Continue supplemental oxygen to keep saturation above 94%. Diuretics changed to torsemide  - now on RA   Old records reviewed in assessment of this patient  Antimicrobials:   DVT prophylaxis: SCDs Start: 07/02/21 0948   Code Status:   Code Status: Full Code Family Communication: son, [name]   Disposition Plan: Status is: Inpatient  Remains inpatient appropriate because:Ongoing diagnostic testing needed not appropriate for outpatient work up, IV treatments appropriate due to intensity of illness or  inability to take PO, and Inpatient level of care appropriate due to severity of illness  Dispo: The patient is from: Home              Anticipated d/c is to: SNF once workup complete               Patient currently is not medically stable to d/c.   Difficult to place patient No  Risk of unplanned readmission score: Unplanned Admission- Pilot do not use: 36.73   Objective: Blood pressure (!) 150/93, pulse (!) 59, temperature 98.8 F (37.1 C), resp. rate (!) 26, height 5' 1"  (1.549 m), weight 102.1 kg, SpO2 100 %.  Examination: General appearance: alert, cooperative, no distress, and morbidly obese Head: Normocephalic, without obvious abnormality, atraumatic Eyes:  EOMI Lungs: clear to auscultation bilaterally Heart: irregularly irregular rhythm and S1, S2 normal Abdomen:  obese, soft, NT, BS present Extremities:  right shoulder noted with fluctuant lesion; pain with passive ROM in shoulder; no erythema or edema noted. LE's show chronic lymphedema without erythema or signs of infection Skin: mobility and turgor normal Neurologic: Grossly normal  Consultants:  ID Ortho surgery GI  Procedures:    Data Reviewed: I have personally reviewed following labs and imaging studies Results for orders placed or performed during the hospital encounter of 07/01/21 (from the past 24 hour(s))  Surgical PCR screen     Status: None   Collection Time: 07/25/21  5:49 PM   Specimen:  Nasal Mucosa; Nasal Swab  Result Value Ref Range   MRSA, PCR NEGATIVE NEGATIVE   Staphylococcus aureus NEGATIVE NEGATIVE  Protime-INR     Status: Abnormal   Collection Time: 07/26/21  4:21 AM  Result Value Ref Range   Prothrombin Time 17.3 (H) 11.4 - 15.2 seconds   INR 1.4 (H) 0.8 - 1.2  CBC with Differential/Platelet     Status: Abnormal   Collection Time: 07/26/21  4:21 AM  Result Value Ref Range   WBC 4.9 4.0 - 10.5 K/uL   RBC 3.39 (L) 3.87 - 5.11 MIL/uL   Hemoglobin 10.1 (L) 12.0 - 15.0 g/dL   HCT 32.9 (L)  36.0 - 46.0 %   MCV 97.1 80.0 - 100.0 fL   MCH 29.8 26.0 - 34.0 pg   MCHC 30.7 30.0 - 36.0 g/dL   RDW 16.5 (H) 11.5 - 15.5 %   Platelets 201 150 - 400 K/uL   nRBC 0.0 0.0 - 0.2 %   Neutrophils Relative % 52 %   Neutro Abs 2.6 1.7 - 7.7 K/uL   Lymphocytes Relative 25 %   Lymphs Abs 1.3 0.7 - 4.0 K/uL   Monocytes Relative 16 %   Monocytes Absolute 0.8 0.1 - 1.0 K/uL   Eosinophils Relative 5 %   Eosinophils Absolute 0.2 0.0 - 0.5 K/uL   Basophils Relative 1 %   Basophils Absolute 0.0 0.0 - 0.1 K/uL   Immature Granulocytes 1 %   Abs Immature Granulocytes 0.07 0.00 - 0.07 K/uL  Magnesium     Status: None   Collection Time: 07/26/21  4:21 AM  Result Value Ref Range   Magnesium 2.4 1.7 - 2.4 mg/dL  Comprehensive metabolic panel     Status: Abnormal   Collection Time: 07/26/21  4:21 AM  Result Value Ref Range   Sodium 141 135 - 145 mmol/L   Potassium 3.7 3.5 - 5.1 mmol/L   Chloride 100 98 - 111 mmol/L   CO2 34 (H) 22 - 32 mmol/L   Glucose, Bld 88 70 - 99 mg/dL   BUN 20 8 - 23 mg/dL   Creatinine, Ser 1.43 (H) 0.44 - 1.00 mg/dL   Calcium 10.9 (H) 8.9 - 10.3 mg/dL   Total Protein 6.3 (L) 6.5 - 8.1 g/dL   Albumin 2.3 (L) 3.5 - 5.0 g/dL   AST 18 15 - 41 U/L   ALT 12 0 - 44 U/L   Alkaline Phosphatase 245 (H) 38 - 126 U/L   Total Bilirubin 2.2 (H) 0.3 - 1.2 mg/dL   GFR, Estimated 38 (L) >60 mL/min   Anion gap 7 5 - 15    Recent Results (from the past 240 hour(s))  Aerobic/Anaerobic Culture w Gram Stain (surgical/deep wound)     Status: None   Collection Time: 07/16/21  4:20 PM   Specimen: Abscess  Result Value Ref Range Status   Specimen Description   Final    ABSCESS RIGHT ARM Performed at [facility], [address] [place], [place] [address]    Special Requests   Final    NONE Performed at [facility], [place] [address], [place], [place] [address]    Gram Stain   Final    ABUNDANT WBC PRESENT,BOTH PMN AND MONONUCLEAR NO ORGANISMS  SEEN    Culture   Final    No growth aerobically or anaerobically. Performed at [facility], [place] [address] [place], [place] [address]    Report Status 07/21/2021  FINAL  Final  Aerobic/Anaerobic Culture w Gram Stain (surgical/deep wound)     Status: None   Collection Time: 07/16/21  4:25 PM   Specimen: Joint, Right Shoulder; Synovial Fluid  Result Value Ref Range Status   Specimen Description   Final    JOINT FLUID RIGHT SHOULDER Performed at [facility], [place] [address] [place], [place] [address]    Special Requests   Final    NONE Performed at [facility], [place] [address] [place], [place] [address]    Gram Stain   Final    FEW WBC PRESENT, PREDOMINANTLY PMN NO ORGANISMS SEEN    Culture   Final    No growth aerobically or anaerobically. Performed at [facility], [place] [address], [place], [place] [address]    Report Status 07/21/2021 FINAL  Final  Resp Panel by RT-PCR (Flu A&B, Covid) Nasopharyngeal Swab     Status: None   Collection Time: 07/17/21 11:13 AM   Specimen: Nasopharyngeal Swab; Nasopharyngeal(NP) swabs in vial transport medium  Result Value Ref Range Status   SARS Coronavirus 2 by RT PCR NEGATIVE NEGATIVE Final    Comment: (NOTE) SARS-CoV-2 target nucleic acids are NOT DETECTED.  The SARS-CoV-2 RNA is generally detectable in upper respiratory specimens during the acute phase of infection. The lowest concentration of SARS-CoV-2 viral copies this assay can detect is 138 copies/mL. A negative result does not preclude SARS-Cov-2 infection and should not be used as the sole basis for treatment or other patient management decisions. A negative result may occur with  improper specimen collection/handling, submission of specimen other than nasopharyngeal swab, presence of viral mutation(s) within the areas targeted by this assay, and inadequate number of viral copies(<138 copies/mL). A negative result must be  combined with clinical observations, patient history, and epidemiological information. The expected result is Negative.  Fact Sheet for Patients:  EntrepreneurPulse.com.au  Fact Sheet for Healthcare Providers:  IncredibleEmployment.be  This test is no t yet approved or cleared by the [place] FDA and  has been authorized for detection and/or diagnosis of SARS-CoV-2 by FDA under an Emergency Use Authorization (EUA). This EUA will remain  in effect (meaning this test can be used) for the duration of the COVID-19 declaration under Section 564(b)(1) of the Act, 21 U.S.C.section 360bbb-3(b)(1), unless the authorization is terminated  or revoked sooner.       Influenza A by PCR NEGATIVE NEGATIVE Final   Influenza B by PCR NEGATIVE NEGATIVE Final    Comment: (NOTE) The Xpert Xpress SARS-CoV-2/FLU/RSV plus assay is intended as an aid in the diagnosis of influenza from Nasopharyngeal swab specimens and should not be used as a sole basis for treatment. Nasal washings and aspirates are unacceptable for Xpert Xpress SARS-CoV-2/FLU/RSV testing.  Fact Sheet for Patients: EntrepreneurPulse.com.au  Fact Sheet for Healthcare Providers: IncredibleEmployment.be  This test is not yet approved or cleared by the [place] FDA and has been authorized for detection and/or diagnosis of SARS-CoV-2 by FDA under an Emergency Use Authorization (EUA). This EUA will remain in effect (meaning this test can be used) for the duration of the COVID-19 declaration under Section 564(b)(1) of the Act, 21 U.S.C. section 360bbb-3(b)(1), unless the authorization is terminated or revoked.  Performed at [facility], [place] [address] [place], [place] [address]   Surgical PCR screen     Status: None   Collection Time: 07/25/21  5:49 PM   Specimen: Nasal Mucosa; Nasal Swab  Result Value  Ref Range Status   MRSA, PCR  NEGATIVE NEGATIVE Final   Staphylococcus aureus NEGATIVE NEGATIVE Final    Comment: (NOTE) The Xpert SA Assay (FDA approved for NASAL specimens in patients 63 years of age and older), is one component of a comprehensive surveillance program. It is not intended to diagnose infection nor to guide or monitor treatment. Performed at [facility], [place] [address] [place], [place] [address]      Radiology Studies: No results found. US Abdomen Limited RUQ (LIVER/GB)  Final Result    VAS [place] LOWER EXTREMITY VENOUS (DVT)  Final Result    CT SHOULDER RIGHT W CONTRAST  Final Result    IR US Guide Bx Asp/Drain  Final Result    IR DRAIN/INJ MAJOR JOINT/BURSA  Final Result    CT SHOULDER RIGHT WO CONTRAST  Final Result    DG Shoulder Right  Final Result    VAS [place] UPPER EXTREMITY VENOUS DUPLEX  Final Result    CT LUMBAR SPINE WO CONTRAST  Final Result    CT PELVIS WO CONTRAST  Final Result    CT KNEE LEFT WO CONTRAST  Final Result    CT KNEE RIGHT WO CONTRAST  Final Result    DG Ankle 2 Views Left  Final Result    CT HEAD WO CONTRAST (5MM)  Final Result    DG Chest 2 View  Final Result    DG Knee Complete 4 Views Right  Final Result    DG Knee Complete 4 Views Left  Final Result      Scheduled Meds:  [MAR Hold] carvedilol  6.25 mg Oral BID WC   [MAR Hold] cholecalciferol  2,000 Units Oral Daily   [MAR Hold] exemestane  25 mg Oral QPC breakfast   [MAR Hold] lactulose  10 g Oral TID   [MAR Hold] levothyroxine  50 mcg Oral QAC breakfast   [MAR Hold] mupirocin ointment  1 application Nasal BID   [MAR Hold] pantoprazole  40 mg Oral Daily   [MAR Hold] potassium chloride  20 mEq Oral Daily   povidone-iodine  2 application Topical Once   [MAR Hold] pravastatin  20 mg Oral q1800   [MAR Hold] rifaximin  550 mg Oral BID   [MAR Hold] vitamin B-12  2,000 mcg Oral Daily   PRN Meds: [MAR Hold] sodium chloride, [MAR Hold] acetaminophen **OR** [MAR  Hold] acetaminophen, [MAR Hold] diclofenac Sodium, [MAR Hold] lipase/protease/amylase, [MAR Hold] ondansetron **OR** [MAR Hold] ondansetron (ZOFRAN) IV, [MAR Hold] oxyCODONE, [MAR Hold] polyvinyl alcohol, [MAR Hold] simethicone, [MAR Hold] traMADol, [MAR Hold] witch hazel-glycerin Continuous Infusions:  [MAR Hold] sodium chloride 250 mL (07/16/21 1319)   lactated ringers 75 mL/hr at 07/26/21 1224     LOS: 24 days  Time spent: Greater than 50% of the 35 minute visit was spent in counseling/coordination of care for the patient as laid out in the A&P.   Dwyane Dee, MD [facility] 07/26/2021, 1:24 PM

## 2021-07-26 NOTE — Anesthesia Procedure Notes (Addendum)
  Anesthesia Regional Block: Interscalene brachial plexus block   Pre-Anesthetic Checklist: , timeout performed,  Correct Patient, Correct Site, Correct Laterality,  Correct Procedure, Correct Position, site marked,  Risks and benefits discussed,  Surgical consent,  Pre-op evaluation,  At surgeon's request and post-op pain management  Laterality: Upper and Right  Prep: Maximum Sterile Barrier Precautions used, chloraprep       Needles:  Injection technique: Single-shot  Needle Type: Echogenic Needle     Needle Length: 5cm  Needle Gauge: 21     Additional Needles:   Procedures:,,,, ultrasound used (permanent image in chart),,    Narrative:  Start time: 07/26/2021 11:28 AM End time: 07/26/2021 11:35 AM Injection made incrementally with aspirations every 5 mL.  Performed by: Personally  Anesthesiologist: [name], MD  Additional Notes: Block assessed prior to procedure. Patient tolerated procedure well.

## 2021-07-26 NOTE — Anesthesia Procedure Notes (Addendum)

## 2021-07-26 NOTE — Progress Notes (Signed)
PT Cancellation Note  Patient Details Name: Katelyn Jackson MRN: [id number] DOB: July 28, 1946   Cancelled Treatment:    Reason Eval/Treat Not Completed: Patient at procedure or test/unavailable (Per RN, pt in OR for R shoulder I&D. PT will follow up as schedule allows.)  Festus Barren., PT, DPT  Acute Rehabilitation Services  Office [phone]  07/26/2021, 11:32 AM

## 2021-07-26 NOTE — Transfer of Care (Signed)
Immediate Anesthesia Transfer of Care Note  Patient: Katelyn Jackson  Procedure(s) Performed: RIGHT SHOULDER HARDWARE REMOVAL WITH WASHOUT (Right)  Patient Location: PACU  Anesthesia Type:GA combined with regional for post-op pain  Level of Consciousness: awake, drowsy and patient cooperative  Airway & Oxygen Therapy: Patient Spontanous Breathing and Patient connected to face mask oxygen  Post-op Assessment: Report given to RN and Post -op Vital signs reviewed and stable  Post vital signs: Reviewed and stable  Last Vitals:  Vitals Value Taken Time  BP 139/62 07/26/21 1422  Temp    Pulse 64 07/26/21 1427  Resp 18 07/26/21 1427  SpO2 100 % 07/26/21 1427  Vitals shown include unvalidated device data.  Last Pain:  Vitals:   07/26/21 1051  TempSrc:   PainSc: 0-No pain      Patients Stated Pain Goal: 3 (01/65/53 7482)  Complications: No notable events documented.

## 2021-07-26 NOTE — Anesthesia Preprocedure Evaluation (Addendum)
Anesthesia Evaluation  Patient identified by MRN, date of birth, ID band Patient awake    Reviewed: Allergy & Precautions, NPO status , Patient's Chart, lab work & pertinent test results  Airway Mallampati: II  TM Distance: >3 FB Neck ROM: Full    Dental no notable dental hx. (+) Edentulous Upper, Edentulous Lower   Pulmonary    Pulmonary exam normal breath sounds clear to auscultation       Cardiovascular hypertension, Pt. on medications pulmonary hypertensionNormal cardiovascular exam+ dysrhythmias + pacemaker  Rhythm:Regular Rate:Normal  03/19/21 Echo 1. Left ventricular ejection fraction, by estimation, is 45 to 50%. The  left ventricle has mildly decreased function. The left ventricle  demonstrates global hypokinesis. The left ventricular internal cavity size  was moderately dilated. Left ventricular  diastolic parameters are indeterminate.  2. Right ventricular systolic function is mildly reduced. The right  ventricular size is mildly enlarged. There is mildly elevated pulmonary  artery systolic pressure.  3. Left atrial size was severely dilated.  4. Right atrial size was severely dilated.  5. The mitral valve is normal in structure. Severe mitral valve  regurgitation. No evidence of mitral stenosis.  6. The aortic valve is normal in structure. There is mild calcification  of the aortic valve. Aortic valve regurgitation is trivial. No aortic  stenosis is present.  7. The inferior vena cava is normal in size with greater than 50%  respiratory variability, suggesting right atrial pressure of 3 mmHg.    Neuro/Psych  Neuromuscular disease    GI/Hepatic GERD  ,  Endo/Other  Hypothyroidism   Renal/GU Renal InsufficiencyRenal diseaseLab Results      Component                Value               Date                      CREATININE               1.43 (H)            07/26/2021                BUN                       20                  07/26/2021                NA                       141                 07/26/2021                K                        3.7                 07/26/2021                CL                       100                 07/26/2021  CO2                      34 (H)              07/26/2021                Musculoskeletal  (+) Arthritis ,   Abdominal (+) + obese (BMI 42.53),   Peds  Hematology  (+) anemia , Lab Results      Component                Value               Date                      WBC                      4.9                 07/26/2021                HGB                      10.1 (L)            07/26/2021                HCT                      32.9 (L)            07/26/2021                MCV                      97.1                07/26/2021                PLT                      201                 07/26/2021              Anesthesia Other Findings   Reproductive/Obstetrics                            Anesthesia Physical Anesthesia Plan  ASA: 3  Anesthesia Plan: General   Post-op Pain Management:  Regional for Post-op pain   Induction: Intravenous  PONV Risk Score and Plan:   Airway Management Planned: Oral ETT  Additional Equipment:   Intra-op Plan:   Post-operative Plan: Extubation in OR  Informed Consent: I have reviewed the patients History and Physical, chart, labs and discussed the procedure including the risks, benefits and alternatives for the proposed anesthesia with the patient or authorized representative who has indicated his/her understanding and acceptance.     Interpreter used for [employer] and Engineer, production given  Plan Discussed with:   Anesthesia Plan Comments: (GA w R ISB Block exparel)       Anesthesia Quick Evaluation

## 2021-07-26 NOTE — Progress Notes (Signed)
Pt on purewick overnight. Unclear if urine in container was emptied.Currently 514ml. Pt bladder scaned 53ml.

## 2021-07-26 NOTE — Progress Notes (Signed)
Assisted Dr. Marzetta Merino with Right Interscalene brachial plexus block. Side rails up, monitors on throughout procedure. See vital signs in flow sheet. Tolerated Procedure well.

## 2021-07-27 ENCOUNTER — Encounter (HOSPITAL_COMMUNITY): Payer: Self-pay | Admitting: Orthopaedic Surgery

## 2021-07-27 ENCOUNTER — Other Ambulatory Visit (HOSPITAL_COMMUNITY): Payer: Self-pay

## 2021-07-27 DIAGNOSIS — G9341 Metabolic encephalopathy: Secondary | ICD-10-CM | POA: Diagnosis not present

## 2021-07-27 DIAGNOSIS — L02413 Cutaneous abscess of right upper limb: Secondary | ICD-10-CM | POA: Diagnosis not present

## 2021-07-27 DIAGNOSIS — L03116 Cellulitis of left lower limb: Secondary | ICD-10-CM | POA: Diagnosis not present

## 2021-07-27 DIAGNOSIS — N179 Acute kidney failure, unspecified: Secondary | ICD-10-CM | POA: Diagnosis not present

## 2021-07-27 LAB — CBC WITH DIFFERENTIAL/PLATELET
Abs Immature Granulocytes: 0.06 10*3/uL (ref 0.00–0.07)
Basophils Absolute: 0 10*3/uL (ref 0.0–0.1)
Basophils Relative: 0 %
Eosinophils Absolute: 0 10*3/uL (ref 0.0–0.5)
Eosinophils Relative: 0 %
HCT: 31.5 % — ABNORMAL LOW (ref 36.0–46.0)
Hemoglobin: 9.6 g/dL — ABNORMAL LOW (ref 12.0–15.0)
Immature Granulocytes: 1 %
Lymphocytes Relative: 17 %
Lymphs Abs: 0.9 10*3/uL (ref 0.7–4.0)
MCH: 29.2 pg (ref 26.0–34.0)
MCHC: 30.5 g/dL (ref 30.0–36.0)
MCV: 95.7 fL (ref 80.0–100.0)
Monocytes Absolute: 0.2 10*3/uL (ref 0.1–1.0)
Monocytes Relative: 4 %
Neutro Abs: 4 10*3/uL (ref 1.7–7.7)
Neutrophils Relative %: 78 %
Platelets: 194 10*3/uL (ref 150–400)
RBC: 3.29 MIL/uL — ABNORMAL LOW (ref 3.87–5.11)
RDW: 16 % — ABNORMAL HIGH (ref 11.5–15.5)
WBC: 5.2 10*3/uL (ref 4.0–10.5)
nRBC: 0 % (ref 0.0–0.2)

## 2021-07-27 LAB — COMPREHENSIVE METABOLIC PANEL
ALT: 11 U/L (ref 0–44)
AST: 18 U/L (ref 15–41)
Albumin: 2.3 g/dL — ABNORMAL LOW (ref 3.5–5.0)
Alkaline Phosphatase: 218 U/L — ABNORMAL HIGH (ref 38–126)
Anion gap: 11 (ref 5–15)
BUN: 20 mg/dL (ref 8–23)
CO2: 29 mmol/L (ref 22–32)
Calcium: 9.9 mg/dL (ref 8.9–10.3)
Chloride: 95 mmol/L — ABNORMAL LOW (ref 98–111)
Creatinine, Ser: 1.25 mg/dL — ABNORMAL HIGH (ref 0.44–1.00)
GFR, Estimated: 45 mL/min — ABNORMAL LOW (ref >60)
Glucose, Bld: 138 mg/dL — ABNORMAL HIGH (ref 70–99)
Potassium: 4.8 mmol/L (ref 3.5–5.1)
Sodium: 135 mmol/L (ref 135–145)
Total Bilirubin: 1.7 mg/dL — ABNORMAL HIGH (ref 0.3–1.2)
Total Protein: 6 g/dL — ABNORMAL LOW (ref 6.5–8.1)

## 2021-07-27 LAB — MAGNESIUM: Magnesium: 2.3 mg/dL (ref 1.7–2.4)

## 2021-07-27 LAB — PROTIME-INR
INR: 1.4 — ABNORMAL HIGH (ref 0.8–1.2)
Prothrombin Time: 17.4 seconds — ABNORMAL HIGH (ref 11.4–15.2)

## 2021-07-27 MED ORDER — VANCOMYCIN HCL 2000 MG/400ML IV SOLN
2000.0000 mg | Freq: Once | INTRAVENOUS | Status: AC
  Administered 2021-07-27: 2000 mg via INTRAVENOUS
  Filled 2021-07-27: qty 400

## 2021-07-27 MED ORDER — VANCOMYCIN HCL 750 MG/150ML IV SOLN
750.0000 mg | INTRAVENOUS | Status: DC
  Administered 2021-07-28 – 2021-07-29 (×2): 750 mg via INTRAVENOUS
  Filled 2021-07-27 (×2): qty 150

## 2021-07-27 NOTE — Anesthesia Postprocedure Evaluation (Signed)
Anesthesia Post Note  Patient: Katelyn Jackson  Procedure(s) Performed: RIGHT SHOULDER HARDWARE REMOVAL WITH WASHOUT (Right)     Anesthesia Post Evaluation No notable events documented.  Last Vitals:  Vitals:   07/27/21 0345 07/27/21 0743  BP: (!) 130/58 (!) 115/51  Pulse: (!) 53 72  Resp: 20 18  Temp: 36.8 C 36.7 C  SpO2: 99% 99%    Last Pain:  Vitals:   07/27/21 1024  TempSrc:   PainSc: 8                  [name]

## 2021-07-27 NOTE — Progress Notes (Signed)
Physical Therapy Treatment Patient Details Name: Katelyn Jackson MRN: [id number] DOB: Mar 25, 1946 Today's Date: 07/27/2021   History of Present Illness Katelyn Jackson is a 75 y.o. female brought by son to the ER for further evaluation of recent fall. Patient's son at the bedside is the historian:.  He tells me that patient fell from the bed on Monday and since then she is complaining of left shoulder pain, left ankle pain and bilateral knee pain, she is unable to walk, stand, go to bathroom and progressively declining. PMH: permanent A. fib, HTN, hyperlipidemia, s/p pacemaker placement, breast cancer, hypothyroidism, chronic bilateral lymphedema, recurrent falls, obesity, GERD, pulmonary hypertension. Developed Right  shoulder prosthetic infection. S/P R shoulder I&D and hardware removal 07/26/21.    PT Comments    Pt remains motivated and puts forth effort to progress activity. Unfortunately, now her R UE is in a sling. She remains weak and hindered by bil LE hip and knee pain.  Will continue to follow and progress activity as tolerated. Continue to recommend SNF.     Recommendations for follow up therapy are one component of a multi-disciplinary discharge planning process, led by the attending physician.  Recommendations may be updated based on patient status, additional functional criteria and insurance authorization.  Follow Up Recommendations  SNF     Equipment Recommendations  None recommended by PT    Recommendations for Other Services OT consult     Precautions / Restrictions Precautions Precautions: Fall;Shoulder Type of Shoulder Precautions: no PROM or AROM R shoulder, OK AROM elbow, wrist and hand. Shoulder Interventions: Shoulder sling/immobilizer(R) Restrictions Weight Bearing Restrictions: Yes Other Position/Activity Restrictions: "she can use shoulder to help with transfers (if she tolerates) and okay for ADLs. otherwise NWB.  sling on until her nerve block wears off  and then afterwards for comfort" per [name], PA-secure chat on 9/20.  Wound VAC R shoulder    Mobility  Bed Mobility Overal bed mobility: Needs Assistance Bed Mobility: Supine to Sit;Sit to Supine     Supine to sit: Max assist;+2 for physical assistance;+2 for safety/equipment Sit to supine: Total assist;+2 for physical assistance;+2 for safety/equipment   General bed mobility comments: Max A +2 for supine to sit. Pt able to moves LEs towards EOB with assistance and able to raise trunk. R UE in sling. Utilized bedpad for scooting, positioning at EOB. Increased time. Cues provided. Once EOB, pt able to sit with Min guard A.    Transfers Overall transfer level: Needs assistance Equipment used:  (back of recliner for pt to pull up on (STEDY not available)) Transfers: Sit to/from Stand Sit to Stand: Total assist;+2 physical assistance;+2 safety/equipment;From elevated surface         General transfer comment: Attempted sit to stand x 2. Pt just barely able to clear bottom from bed surface. Cues provided. Assist to power up provided by therapists.  Ambulation/Gait             General Gait Details: unable   Stairs             Wheelchair Mobility    Modified Rankin (Stroke Patients Only)       Balance Overall balance assessment: Needs assistance Sitting-balance support: Single extremity supported;No upper extremity supported;Feet supported Sitting balance-Leahy Scale: Fair                                      Cognition Arousal/Alertness: Awake/alert  Behavior During Therapy: WFL for tasks assessed/performed Overall Cognitive Status: Within Functional Limits for tasks assessed                                 General Comments: Patient able to understand and speak some english. Son assists with translation      Exercises      General Comments        Pertinent Vitals/Pain Pain Assessment: Faces Pain Score: 7   Faces Pain Scale: Hurts whole lot Pain Location: R shoulder, L hip/thigh, bil knees Pain Descriptors / Indicators: Discomfort;Sore;Guarding Pain Intervention(s): Limited activity within patient's tolerance;Premedicated before session;Repositioned;Monitored during session    Home Living Family/patient expects to be discharged to:: Private residence Living Arrangements: Children;Other relatives Available Help at Discharge: Family;Personal care attendant Type of Home: House Home Access: Level entry   Home Layout: Multi-level;Able to live on main level with bedroom/bathroom Home Equipment: Gilford Rile - 2 wheels;Walker - 4 wheels;Bedside commode;Grab bars - tub/shower;Tub bench;Wheelchair - manual      Prior Function Level of Independence: Needs assistance  Gait / Transfers Assistance Needed: Pt's son reports pt ambulates with 4WW ADL's / Homemaking Assistance Needed: Pt's son reports aide comes daily 2-3 hrs assist with bathing, dressing, in/out of bed. has difficulty lifting legs. can toilet herself; son and his spouse assist pt as needed ( as per chart) patients son reported on phone today that patient was independent in all ADLs prior level.     PT Goals (current goals can now be found in the care plan section) Progress towards PT goals: Progressing toward goals    Frequency    Min 2X/week      PT Plan Current plan remains appropriate    Co-evaluation PT/OT/SLP Co-Evaluation/Treatment: Yes Reason for Co-Treatment: For patient/therapist safety PT goals addressed during session: Mobility/safety with mobility OT goals addressed during session: ADL's and self-care      AM-PAC PT "6 Clicks" Mobility   Outcome Measure  Help needed turning from your back to your side while in a flat bed without using bedrails?: Total Help needed moving from lying on your back to sitting on the side of a flat bed without using bedrails?: Total Help needed moving to and from a bed to a chair  (including a wheelchair)?: Total Help needed standing up from a chair using your arms (e.g., wheelchair or bedside chair)?: Total Help needed to walk in hospital room?: Total Help needed climbing 3-5 steps with a railing? : Total 6 Click Score: 6    End of Session Equipment Utilized During Treatment: Gait belt Activity Tolerance: Patient tolerated treatment well Patient left: in bed;with call bell/phone within reach;with bed alarm set;with family/visitor present   PT Visit Diagnosis: Other abnormalities of gait and mobility (R26.89);Muscle weakness (generalized) (M62.81)     Time: 1610-9604 PT Time Calculation (min) (ACUTE ONLY): 34 min  Charges:  $Therapeutic Activity: 8-22 mins                         Doreatha Massed, PT Acute Rehabilitation  Office: [phone] Pager: [phone]

## 2021-07-27 NOTE — TOC Progression Note (Signed)
Transition of Care [facility]) - Progression Note    Patient Details  Name: Nakiesha Rumsey MRN: [id number] Date of Birth: 1946-10-12  [facility] of [facility]) CM/SW Contact  Deone Omahoney, Juliann Pulse, RN Phone Number: 07/27/2021, 1:14 PM  Clinical Narrative:Noted wound vac-R shoulder abscess,s/p I&D. D/c plan SNF [place] rep Juliann Pulse aware & following,once medically stable will start auth within 24-48hrs of d/c.       Expected Discharge Plan: Skilled Nursing Facility Barriers to Discharge: Continued Medical Work up  Expected Discharge Plan and Services Expected Discharge Plan: [place]                                               Social Determinants of Health (SDOH) Interventions    Readmission Risk Interventions Readmission Risk Prevention Plan 07/06/2021 03/19/2021  Transportation Screening Complete Complete  HRI or [place] - Complete  Social Work Consult for [place] Planning/Counseling - Complete  Palliative Care Screening - Not Applicable  Medication Review Press photographer) - Complete  HRI or Home Care Consult Complete -  SW Recovery Care/Counseling Consult Complete -  Palliative Care Screening Not Applicable -  Skilled Nursing Facility Complete -  Some recent data might be hidden

## 2021-07-27 NOTE — Progress Notes (Signed)
PROGRESS NOTE   Katelyn Jackson  RXV:[id number]    DOB: 15-Aug-1946    DOA: 07/01/2021  PCP: Cassandria Anger, MD   I have briefly reviewed patients previous medical records in [facility].  Chief Complaint  Patient presents with   Dysuria    Brief Narrative:  Katelyn Jackson is a 75 yo female with PMH permanent Afib (on Coumadin), HTN, HLD, pacer in place, hx breast cancer, hypothyroidism, chronic bilateral lower extremity lymphedema, morbid obesity, recurrent falls, GERD, pulmonary hypertension who presented to the hospital with worsening weakness and falls at home.  She had endorsed pain in her left ankle, knee, and shoulder. She was initially treated for suspected cellulitis in her left leg due to some increased erythema noted on admission. She was also noted to have a wound over her right shoulder and underwent further work-up for this as well.   See below for further A&P.   Assessment & Plan:  Principal Problem:   Abscess of right shoulder Active Problems:   Essential hypertension   Chronic atrial fibrillation (HCC)   Lymphedema   Malignant neoplasm of upper-outer quadrant of left breast in female, estrogen receptor positive (HCC)   Anemia of chronic disease   Acute renal failure superimposed on stage 3b chronic kidney disease (HCC)   Morbid obesity (HCC)   GERD (gastroesophageal reflux disease)   Bilateral pain of leg and foot   Hypothyroidism   Pressure injury of skin   History of pacemaker   Decompensated hepatic cirrhosis ([place])   Abscess   Prosthetic joint infection (HCC)   Abscess of right shoulder - s/p right shoulder hemiarthroplasty in May 2022 - s/p aspiration of shoulder on 07/16/21 but appears fluid too thick for likely a good sample and pus was noted during procedure - agree with ID this needs washout as aspiration will be ineffective; she has ongoing pain which is confounded some by her chronic degeneration and atrophy of rotator cuff and  narrowing of subacromial space seen on CT - INR does respond to Vit K and after further workup appears elevated INR due to underlying cirrhosis with likely some synthetic dysfunction of liver -S/p I&D with resection arthroplasty and cultures taken in the operating room on 9/19.  Has wound VAC in place. - ID has started her on IV vancomycin.   Decompensated hepatic cirrhosis ([place]) - despite new diagnosis, she's had this a while; RUQ u/s on 9/14 shows nodular liver consistent with diagnosis; also some GB sludge but LFT pattern not consistent with obstruction  - she has had elevated INR despite coumadin washout with ongoing elevated LFTs which may have some confounding from underlying infection; ammonia also upper limit normal on 9/9 and she's responded well to lactulose (rifaximin also started per GI) - GI consulted for further assistance with evaluation - likely NAFLD; no history of etoh use that would explain - further autoimmune workup also commenced.  Only significant for elevated alpha-1 antitrypsin levels.  ANA, antimitochondrial antibody, c-ANCA, p-ANCA and atypical p-ANCA titer: Negative.  HBsAg and HCV antibody negative. - renal function improved with IVF (was overdiuresis with torsemide)   Chronic atrial fibrillation (HCC) - on Coumadin at home prior to admission - holding anticoagulation for now in setting of need for surgery - see INR workup as well  - given new diagnosis of cirrhosis, will transition her to Eliquis once resuming anticoagulation - Eliquis to be started when cleared by orthopedics.   Elevated INR-resolved as of 07/25/2021 - INR peaked at  9.9 after admission on 8/26 and she was given Vit K to help reverse  - last Coumadin dose 9/9  - see cirrhosis now as well -INR 1.4 on 9/20.   Acute renal failure superimposed on stage 3b chronic kidney disease (HCC) - patient has history of CKD3b. Baseline creat ~ 1.4 - 1.5, eGFR 31 - 41 - has been on torsemide 40 mg BID likely  contributing to overdiuresis; not consistent with a hepatorenal syndrome at this time - torsemide stopped on 9/15 - s/p IVF resuscitation with good improvement in renal function - continue holding diuretic and will resume as needed - Acute renal failure seems to have resolved.  Creatinine down to 1.25.   Acute metabolic encephalopathy-resolved as of 07/23/2021 - Multifactorial and likely some hospital delirium contributing given extended length of stay - patient symptoms include occasional AMS - etiology considered due to toxic/metabolic - continue re-orienting as needed - treating infection - given recently elevated NH3 and now diagnosis consistent with cirrhosis, she is also started on lactulose and rifaximin    Cellulitis of left lower extremity-resolved as of 07/21/2021 - mild erythema on admission from media pic; now treated and resolved    History of pacemaker - stable; at risk for infection seeding without source control  - implanted at [facility] on 01/06/21: [employer] pacemaker with model number of ASSURITY MRI PM 2272 and serial number of [id number]   Hypothyroidism - Continue Synthroid   GERD (gastroesophageal reflux disease) - Continue Protonix   Morbid obesity (HCC) - Complicates overall prognosis and care - Body mass index is 43.36 kg/m. - Weight Loss and Dietary Counseling given   Anemia of chronic disease - Baseline hemoglobin 9 to 10 g/dL -Currently at baseline and no signs of bleeding   Malignant neoplasm of upper-outer quadrant of left breast in female, estrogen receptor positive (HCC) - outpatient follow up as indicated    Lymphedema - Stable edema.  No further signs of cellulitis involving lower extremities   Essential hypertension - Continue Coreg   Acute respiratory failure with hypoxia (HCC)-resolved as of 07/21/2021 secondary to fluid overload,  acute on chronic systolic CHF, hypercapnia likely from OSA. Respiratory status much improved.  Patient had  adequate diuresis with negative fluid balance. Continue supplemental oxygen to keep saturation above 94%. Diuretics changed to torsemide  - now on RA    DVT prophylaxis: SCD's Start: 07/26/21 1538 SCDs Start: 07/02/21 0948     Code Status: Full Code Family Communication: Discussed with patient's son at bedside.  He communicates in English.  Updated care and answered all questions. Disposition:  Status is: Inpatient  Remains inpatient appropriate because:Inpatient level of care appropriate due to severity of illness  Dispo: The patient is from: Home              Anticipated d/c is to: SNF              Patient currently is not medically stable to d/c.   Difficult to place patient No        Consultants:   Infectious disease Orthopedics Gastroenterology  Procedures:   As noted above.  Antimicrobials:    Anti-infectives (From admission, onward)    Start     Dose/Rate Route Frequency Ordered Stop   07/28/21 1600  vancomycin (VANCOREADY) IVPB 750 mg/150 mL        750 mg 150 mL/hr over 60 Minutes Intravenous Every 24 hours 07/27/21 1405     07/27/21 1500  vancomycin (VANCOREADY)   IVPB 2000 mg/400 mL        2,000 mg 200 mL/hr over 120 Minutes Intravenous  Once 07/27/21 1404     07/26/21 1830  ceFAZolin (ANCEF) IVPB 1 g/50 mL premix        1 g 100 mL/hr over 30 Minutes Intravenous Every 6 hours 07/26/21 1537 07/27/21 0610   07/26/21 1325  vancomycin (VANCOCIN) powder  Status:  Discontinued          As needed 07/26/21 1338 07/26/21 1514   07/26/21 1325  tobramycin (NEBCIN) powder  Status:  Discontinued          As needed 07/26/21 1338 07/26/21 1514   07/26/21 1100  ceFAZolin (ANCEF) IVPB 2g/100 mL premix        2 g 200 mL/hr over 30 Minutes Intravenous On call to O.R. 07/26/21 1055 07/26/21 1316   07/26/21 1056  ceFAZolin (ANCEF) 2-4 GM/100ML-% IVPB       Note to Pharmacy: Lavon Paganini   : cabinet override      07/26/21 1056 07/26/21 1309   07/22/21 1100  rifaximin  (XIFAXAN) tablet 550 mg        550 mg Oral 2 times daily 07/22/21 1005     07/16/21 1200  vancomycin (VANCOREADY) IVPB 750 mg/150 mL  Status:  Discontinued        750 mg 150 mL/hr over 60 Minutes Intravenous Every 24 hours 07/15/21 1242 07/19/21 1537   07/15/21 1400  vancomycin (VANCOREADY) IVPB 2000 mg/400 mL        2,000 mg 200 mL/hr over 120 Minutes Intravenous  Once 07/15/21 1242 07/15/21 1522   07/10/21 1530  doxycycline (VIBRA-TABS) tablet 100 mg  Status:  Discontinued        100 mg Oral Every 12 hours 07/10/21 1437 07/15/21 1127   07/05/21 1245  cefadroxil (DURICEF) capsule 500 mg        500 mg Oral 2 times daily 07/05/21 1155 07/07/21 2217   07/03/21 0900  cefTRIAXone (ROCEPHIN) 2 g in sodium chloride 0.9 % 100 mL IVPB  Status:  Discontinued        2 g 200 mL/hr over 30 Minutes Intravenous Every 24 hours 07/02/21 0950 07/05/21 1155   07/02/21 0915  cefTRIAXone (ROCEPHIN) 2 g in sodium chloride 0.9 % 100 mL IVPB        2 g 200 mL/hr over 30 Minutes Intravenous  Once 07/02/21 0909 07/02/21 1033         Subjective:  Interviewed and examined patient using video interpreter.  Patient's son then walked in towards the tail end of the discussion.  Denies much right shoulder pain.  Bilateral leg pains.  Wants to know if she can move her right upper extremity.  Had not gotten her breakfast and wanted to know if she could eat.  Objective:   Vitals:   07/27/21 0345 07/27/21 0500 07/27/21 0743 07/27/21 1216  BP: (!) 130/58  (!) 115/51 116/62  Pulse: (!) 53  72 72  Resp: _0 Temp: 98.2 F (36.8 C)  98.1 F (36.7 C) 98.5 F (36.9 C)  TempSrc: Oral  Oral Oral  SpO2: 99%  99% 98%  Weight:  110.5 kg    Height:        General exam: Elderly female, moderately built and morbidly obese lying comfortably propped up in bed without distress. Respiratory system: Clear to auscultation. Respiratory effort normal. Cardiovascular system: S1 & S2 heard, RRR. No JVD, murmurs, rubs,  gallops or clicks.  Trace bilateral leg edema.  Telemetry personally reviewed: On demand V paced rhythm. Gastrointestinal system: Abdomen is nondistended, soft and nontender. No organomegaly or masses felt. Normal bowel sounds heard. Central nervous system: Alert and oriented. No focal neurological deficits. Extremities: Symmetric 5 x 5 power.  Right upper extremity in sling.  Right shoulder with wound VAC in place. Skin: No rashes, lesions or ulcers Psychiatry: Judgement and insight appear normal. Mood & affect appropriate.     Data Reviewed:   I have personally reviewed following labs and imaging studies   CBC: Recent Labs  Lab 07/25/21 0525 07/26/21 0421 07/27/21 0449  WBC 6.0 4.9 5.2  NEUTROABS 3.9 2.6 4.0  HGB 10.3* 10.1* 9.6*  HCT 33.2* 32.9* 31.5*  MCV 96.2 97.1 95.7  PLT 195 201 194    Basic Metabolic Panel: Recent Labs  Lab 07/25/21 0525 07/26/21 0421 07/27/21 0449  NA 137 141 135  K 3.9 3.7 4.8  CL 94* 100 95*  CO2 31 34* 29  GLUCOSE 95 88 138*  BUN 24* 20 20  CREATININE 1.51* 1.43* 1.25*  CALCIUM 10.3 10.9* 9.9  MG 2.5* 2.4 2.3    Liver Function Tests: Recent Labs  Lab 07/25/21 0525 07/26/21 0421 07/27/21 0449  AST 22 18 18  ALT 14 12 11  ALKPHOS 273* 245* 218*  BILITOT 2.0* 2.2* 1.7*  PROT 6.5 6.3* 6.0*  ALBUMIN 2.5* 2.3* 2.3*    CBG: No results for input(s): GLUCAP in the last 168 hours.  Microbiology Studies:   Recent Results (from the past 240 hour(s))  Surgical PCR screen     Status: None   Collection Time: 07/25/21  5:49 PM   Specimen: Nasal Mucosa; Nasal Swab  Result Value Ref Range Status   MRSA, PCR NEGATIVE NEGATIVE Final   Staphylococcus aureus NEGATIVE NEGATIVE Final    Comment: (NOTE) The Xpert SA Assay (FDA approved for NASAL specimens in patients 22 years of age and older), is one component of a comprehensive surveillance program. It is not intended to diagnose infection nor to guide or monitor treatment. Performed  at [facility], [address] [place], [place] [address]   Aerobic/Anaerobic Culture w Gram Stain (surgical/deep wound)     Status: None (Preliminary result)   Collection Time: 07/26/21  2:37 PM   Specimen: Soft Tissue, Other  Result Value Ref Range Status   Specimen Description   Final    TISSUE SHOULDER RIGHT 1 Performed at [facility], [address] [place], [place] [address]    Special Requests   Final    NONE Performed at [facility], [address] [place], [place] [address]    Gram Stain   Final    FEW WBC PRESENT, PREDOMINANTLY PMN NO ORGANISMS SEEN    Culture   Final    NO GROWTH < 24 HOURS Performed at [facility], [address] [place], [place] [address]    Report Status PENDING  Incomplete  Aerobic/Anaerobic Culture w Gram Stain (surgical/deep wound)     Status: None (Preliminary result)   Collection Time: 07/26/21  2:38 PM   Specimen: Soft Tissue, Other  Result Value Ref Range Status   Specimen Description   Final    TISSUE SHOULDER RIGHT 2 Performed at [facility], [address] [place], [place] [address]    Special Requests   Final    NONE Performed at [facility],   [place] [address] [place], [place] [address]    Gram Stain   Final    FEW WBC PRESENT, PREDOMINANTLY PMN NO ORGANISMS SEEN    Culture   Final    NO GROWTH < 24 HOURS Performed at [place] [address] [place], [place] [address]    Report Status PENDING  Incomplete     Radiology Studies:  DG Shoulder Right Port  Result Date: 07/26/2021 CLINICAL DATA:  Postop right shoulder. EXAM: PORTABLE RIGHT SHOULDER COMPARISON:  Shoulder CT 07/19/2021 FINDINGS: Previous right shoulder arthroplasty has been removed. Surgical drain is in place. Chronic changes about the acromioclavicular joint. Previously demonstrated fracture through the base of the acromion is grossly similar  common but not well assessed on this portable view. IMPRESSION: 1. Explantation of right shoulder arthroplasty. Surgical drain in place. 2. Previously demonstrated fracture through the base of the acromion was better assessed on prior CT. Electronically Signed   By: Keith Rake M.D.   On: 07/26/2021 16:58     Scheduled Meds:    carvedilol  6.25 mg Oral BID WC   cholecalciferol  2,000 Units Oral Daily   docusate sodium  100 mg Oral BID   exemestane  25 mg Oral QPC breakfast   lactulose  10 g Oral TID   levothyroxine  50 mcg Oral QAC breakfast   mupirocin ointment  1 application Nasal BID   pantoprazole  40 mg Oral Daily   potassium chloride  20 mEq Oral Daily   pravastatin  20 mg Oral q1800   rifaximin  550 mg Oral BID   vitamin B-12  2,000 mcg Oral Daily    Continuous Infusions:    sodium chloride 250 mL (07/16/21 1319)   vancomycin 2,000 mg (07/27/21 1537)   [START ON 07/28/2021] vancomycin       LOS: 25 days     Vernell Leep, MD, [place], [facility]. [facility]    To contact the attending provider between 7A-7P or the covering provider during after hours 7P-7A, please log into the web site [email or web address] and access using universal [place] password for that web site. If you do not have the password, please call the hospital operator.  07/27/2021, 5:17 PM

## 2021-07-27 NOTE — Evaluation (Signed)
Occupational Therapy Evaluation Patient Details Name: Katelyn Jackson MRN: [id number] DOB: February 03, 1946 Today's Date: 07/27/2021   History of Present Illness Katelyn Jackson is a 75 y.o. female brought by son to the ER for further evaluation of recent fall. Patient's son at the bedside is the historian:.  He tells me that patient fell from the bed on Monday and since then she is complaining of left shoulder pain, left ankle pain and bilateral knee pain, she is unable to walk, stand, go to bathroom and progressively declining. PMH: permanent A. fib, HTN, hyperlipidemia, s/p pacemaker placement, breast cancer, hypothyroidism, chronic bilateral lymphedema, recurrent falls, obesity, GERD, pulmonary hypertension. Developed Right  shoulder prosthetic infection. S/P R shoulder I&D and hardware removal 07/26/21.   Clinical Impression   Patient is a 75 year old female who was admitted for above. Patient was previously living at home with son support. Currently patient is total assistance for ADL and mobility tasks with R dominant hand in sling post surgery at this time. Patient would continue to benefit from skilled OT services at this time while admitted and after d/c to address noted deficits in order to improve overall safety and independence in ADLs.        Recommendations for follow up therapy are one component of a multi-disciplinary discharge planning process, led by the attending physician.  Recommendations may be updated based on patient status, additional functional criteria and insurance authorization.   Follow Up Recommendations  SNF    Equipment Recommendations  Other (comment)    Recommendations for Other Services       Precautions / Restrictions Precautions Precautions: Fall;Shoulder Type of Shoulder Precautions: no PROM or AROM R shoulder, OK AROM elbow, wrist and hand. Shoulder Interventions: Shoulder sling/immobilizer Precaution Booklet Issued: No (son was present and educated.  son was able to explain to patient. nurse was educated on recommendations as well.) Required Braces or Orthoses: Sling (R UE) Restrictions Weight Bearing Restrictions: Yes Other Position/Activity Restrictions: "she can use shoulder to help with transfers (if she tolerates) and okay for ADLs. otherwise NWB.  sling on until her nerve block wears off and then afterwards for comfort" per Katelyn Chapel, PA-secure chat on 9/20.      Mobility Bed Mobility Overal bed mobility: Needs Assistance Bed Mobility: Supine to Sit;Sit to Supine Rolling: Max assist;+2 for physical assistance;+2 for safety/equipment   Supine to sit: Max assist;+2 for physical assistance;+2 for safety/equipment Sit to supine: Total assist;+2 for physical assistance;+2 for safety/equipment   General bed mobility comments: patient needed physical assist to move BLE to edge of bed and get trunk into upright positioning. patient required increased time with cues provided for patient to participate in movements as much as possible. patient was able to sit on edge of bed with min guard.    Transfers Overall transfer level: Needs assistance Equipment used:  (back of recliner to attempt standing ( steady not available)) Transfers: Sit to/from Stand Sit to Stand: Total assist;+2 physical assistance;+2 safety/equipment;From elevated surface         General transfer comment: Attempted sit to stand x 2. Pt just barely able to clear bottom from bed surface. Cues provided. Assist to power up provided by therapists.    Balance Overall balance assessment: Needs assistance Sitting-balance support: Single extremity supported;No upper extremity supported;Feet supported Sitting balance-Leahy Scale: Fair   Postural control: Left lateral lean Standing balance support: Bilateral upper extremity supported Standing balance-Leahy Scale: Zero Standing balance comment: heavily reliant on +2 assist  ADL  either performed or assessed with clinical judgement   ADL Overall ADL's : Needs assistance/impaired Eating/Feeding: Sitting;Minimal assistance Eating/Feeding Details (indicate cue type and reason): patient is noted to have temor like movements with attempting to use LUE for transfers also noted to be patients nondominant hand. Grooming: Sitting;Brushing hair;Minimal assistance   Upper Body Bathing: Bed level;Total assistance   Lower Body Bathing: Bed level;Total assistance   Upper Body Dressing : Bed level;Total assistance   Lower Body Dressing: Bed level;Total assistance   Toilet Transfer: +2 for safety/equipment;+2 for physical assistance;Total assistance Toilet Transfer Details (indicate cue type and reason): patient is +3 to attempt standing at edge of bed while pulling up on recliner back. transfer deferred at this time [place] and Hygiene: Bed level;Total assistance;+2 for physical assistance       Functional mobility during ADLs: +2 for physical assistance;+2 for safety/equipment;Total assistance General ADL Comments: patient and son were educated on recommendations for sling at this time. patient required max A to adjust sling into place sitting on edge of bed. patient is motivated to participate in session but limited by pain in bilateral hips.     Vision Patient Visual Report: No change from baseline       Perception     Praxis      Pertinent Vitals/Pain Pain Assessment: Faces Pain Score: 7  Faces Pain Scale: Hurts whole lot Pain Location: R shoulder, L hip/thigh, bil knees Pain Descriptors / Indicators: Discomfort;Sore;Guarding Pain Intervention(s): Limited activity within patient's tolerance;Premedicated before session;Repositioned;Monitored during session     Hand Dominance Right   Extremity/Trunk Assessment Upper Extremity Assessment Upper Extremity Assessment: RUE deficits/detail RUE Deficits / Details: patient underwent surgery  on 9/19 with hardwear removal and I&D RUE: Unable to fully assess due to pain (nerve block still in place) RUE Coordination: decreased fine motor;decreased gross motor   Lower Extremity Assessment Lower Extremity Assessment: Defer to PT evaluation   Cervical / Trunk Assessment Cervical / Trunk Assessment: Normal   Communication Communication Communication: Prefers language other than English (russian)   Cognition Arousal/Alertness: Awake/alert Behavior During Therapy: WFL for tasks assessed/performed Overall Cognitive Status: Within Functional Limits for tasks assessed                                 General Comments: Patient able to understand and speak some english. Son assists with translation   General Comments       Exercises     Shoulder Instructions      Home Living Family/patient expects to be discharged to:: Private residence Living Arrangements: Children;Other relatives Available Help at Discharge: Family;Personal care attendant Type of Home: House Home Access: Level entry     Home Layout: Multi-level;Able to live on main level with bedroom/bathroom     Bathroom Shower/Tub: Teacher, early years/pre: Standard     Home Equipment: Environmental consultant - 2 wheels;Walker - 4 wheels;Bedside commode;Grab bars - tub/shower;Tub bench;Wheelchair - manual          Prior Functioning/Environment Level of Independence: Needs assistance  Gait / Transfers Assistance Needed: Pt's son reports pt ambulates with 4WW ADL's / Homemaking Assistance Needed: Pt's son reports aide comes daily 2-3 hrs assist with bathing, dressing, in/out of bed. has difficulty lifting legs. can toilet herself; son and his spouse assist pt as needed ( as per chart) patients son reported on phone today that patient was independent in all ADLs prior  level.            OT Problem List: Decreased strength;Decreased range of motion;Decreased activity tolerance;Impaired balance (sitting  and/or standing);Decreased safety awareness;Pain;Impaired UE functional use;Decreased knowledge of use of DME or AE      OT Treatment/Interventions: Self-care/ADL training;Therapeutic exercise;Neuromuscular education;Energy conservation;DME and/or AE instruction;Balance training;Patient/family education;Therapeutic activities    OT Goals(Current goals can be found in the care plan section) Acute Rehab OT Goals Patient Stated Goal: to get out of bed OT Goal Formulation: With patient Time For Goal Achievement: 07/21/21 Potential to Achieve Goals: Fair  OT Frequency: Min 2X/week   Barriers to D/C:            Co-evaluation PT/OT/SLP Co-Evaluation/Treatment: Yes Reason for Co-Treatment: For patient/therapist safety PT goals addressed during session: Mobility/safety with mobility OT goals addressed during session: ADL's and self-care      AM-PAC OT "6 Clicks" Daily Activity     Outcome Measure Help from another person eating meals?: A Little Help from another person taking care of personal grooming?: A Lot Help from another person toileting, which includes using toliet, bedpan, or urinal?: Total Help from another person bathing (including washing, rinsing, drying)?: Total Help from another person to put on and taking off regular upper body clothing?: Total Help from another person to put on and taking off regular lower body clothing?: Total 6 Click Score: 9   End of Session Equipment Utilized During Treatment: Gait belt;Rolling walker Nurse Communication: Other (comment) (nurse educated on orders and update from PA)  Activity Tolerance: Patient tolerated treatment well Patient left: in bed;with call bell/phone within reach;with family/visitor present  OT Visit Diagnosis: Muscle weakness (generalized) (M62.81);Pain Pain - Right/Left: Right Pain - part of body: Shoulder                Time: 1101-1119 OT Time Calculation (min): 18 min Charges:  OT General Charges $OT Visit: 1  Visit OT Evaluation $OT Re-eval: 1 Re-eval  [name] OTR/L, MS Acute Rehabilitation Department Office# [phone] Pager# [phone]   [place] 07/27/2021, 12:48 PM

## 2021-07-27 NOTE — Progress Notes (Signed)
Subjective:  She says she feels ok after having had surgery     Antibiotics:  Anti-infectives (From admission, onward)    Start     Dose/Rate Route Frequency Ordered Stop   07/28/21 1600  vancomycin (VANCOREADY) IVPB 750 mg/150 mL        750 mg 150 mL/hr over 60 Minutes Intravenous Every 24 hours 07/27/21 1405     07/27/21 1500  vancomycin (VANCOREADY) IVPB 2000 mg/400 mL        2,000 mg 200 mL/hr over 120 Minutes Intravenous  Once 07/27/21 1404     07/26/21 1830  ceFAZolin (ANCEF) IVPB 1 g/50 mL premix        1 g 100 mL/hr over 30 Minutes Intravenous Every 6 hours 07/26/21 1537 07/27/21 0610   07/26/21 1325  vancomycin (VANCOCIN) powder  Status:  Discontinued          As needed 07/26/21 1338 07/26/21 1514   07/26/21 1325  tobramycin (NEBCIN) powder  Status:  Discontinued          As needed 07/26/21 1338 07/26/21 1514   07/26/21 1100  ceFAZolin (ANCEF) IVPB 2g/100 mL premix        2 g 200 mL/hr over 30 Minutes Intravenous On call to O.R. 07/26/21 1055 07/26/21 1316   07/26/21 1056  ceFAZolin (ANCEF) 2-4 GM/100ML-% IVPB       Note to Pharmacy: Lavon Paganini   : cabinet override      07/26/21 1056 07/26/21 1309   07/22/21 1100  rifaximin (XIFAXAN) tablet 550 mg        550 mg Oral 2 times daily 07/22/21 1005     07/16/21 1200  vancomycin (VANCOREADY) IVPB 750 mg/150 mL  Status:  Discontinued        750 mg 150 mL/hr over 60 Minutes Intravenous Every 24 hours 07/15/21 1242 07/19/21 1537   07/15/21 1400  vancomycin (VANCOREADY) IVPB 2000 mg/400 mL        2,000 mg 200 mL/hr over 120 Minutes Intravenous  Once 07/15/21 1242 07/15/21 1522   07/10/21 1530  doxycycline (VIBRA-TABS) tablet 100 mg  Status:  Discontinued        100 mg Oral Every 12 hours 07/10/21 1437 07/15/21 1127   07/05/21 1245  cefadroxil (DURICEF) capsule 500 mg        500 mg Oral 2 times daily 07/05/21 1155 07/07/21 2217   07/03/21 0900  cefTRIAXone (ROCEPHIN) 2 g in sodium chloride 0.9 % 100 mL IVPB   Status:  Discontinued        2 g 200 mL/hr over 30 Minutes Intravenous Every 24 hours 07/02/21 0950 07/05/21 1155   07/02/21 0915  cefTRIAXone (ROCEPHIN) 2 g in sodium chloride 0.9 % 100 mL IVPB        2 g 200 mL/hr over 30 Minutes Intravenous  Once 07/02/21 0909 07/02/21 1033       Medications: Scheduled Meds:  carvedilol  6.25 mg Oral BID WC   cholecalciferol  2,000 Units Oral Daily   docusate sodium  100 mg Oral BID   exemestane  25 mg Oral QPC breakfast   lactulose  10 g Oral TID   levothyroxine  50 mcg Oral QAC breakfast   mupirocin ointment  1 application Nasal BID   pantoprazole  40 mg Oral Daily   potassium chloride  20 mEq Oral Daily   pravastatin  20 mg Oral q1800   rifaximin  550 mg Oral BID  vitamin B-12  2,000 mcg Oral Daily   Continuous Infusions:  sodium chloride 250 mL (07/16/21 1319)   vancomycin     [START ON 07/28/2021] vancomycin     PRN Meds:.sodium chloride, acetaminophen **OR** acetaminophen, diclofenac Sodium, diphenhydrAMINE, HYDROmorphone (DILAUDID) injection, lipase/protease/amylase, menthol-cetylpyridinium **OR** phenol, ondansetron **OR** ondansetron (ZOFRAN) IV, oxyCODONE, oxyCODONE, polyvinyl alcohol, simethicone, witch hazel-glycerin    Objective: Weight change: 8.4 kg  Intake/Output Summary (Last 24 hours) at 07/27/2021 1442 Last data filed at 07/27/2021 1238 Gross per 24 hour  Intake 613.33 ml  Output 1600 ml  Net -986.67 ml    Blood pressure 116/62, pulse 72, temperature 98.5 F (36.9 C), temperature source Oral, resp. rate 16, height 5\' 1"  (1.549 m), weight 110.5 kg, SpO2 98 %. Temp:  [98 F (36.7 C)-98.7 F (37.1 C)] 98.5 F (36.9 C) (09/20 1216) Pulse Rate:  [53-95] 72 (09/20 1216) Resp:  [16-21] 16 (09/20 1216) BP: (113-135)/(51-78) 116/62 (09/20 1216) SpO2:  [92 %-100 %] 98 % (09/20 1216) Weight:  [110.5 kg] 110.5 kg (09/20 0500)  Physical Exam: Physical Exam Constitutional:      General: She is not in acute distress.     Appearance: She is well-developed. She is not diaphoretic.  HENT:     Head: Normocephalic and atraumatic.     Right Ear: External ear normal.     Left Ear: External ear normal.     Mouth/Throat:     Pharynx: No oropharyngeal exudate.  Eyes:     General: No scleral icterus.       Right eye: No discharge.        Left eye: No discharge.     Extraocular Movements: Extraocular movements intact.     Conjunctiva/sclera: Conjunctivae normal.     Pupils: Pupils are equal, round, and reactive to light.  Cardiovascular:     Rate and Rhythm: Normal rate and regular rhythm.  Pulmonary:     Effort: Pulmonary effort is normal. No respiratory distress.     Breath sounds: No wheezing.  Abdominal:     General: There is no distension.     Palpations: Abdomen is soft.     Tenderness: There is no abdominal tenderness.  Musculoskeletal:        General: No tenderness.     Right lower leg: Edema present.     Left lower leg: Edema present.     Comments: Right shoulder in sling  Lymphadenopathy:     Cervical: No cervical adenopathy.  Skin:    General: Skin is warm and dry.     Coloration: Skin is not pale.     Findings: No erythema or rash.  Neurological:     General: No focal deficit present.     Mental Status: She is alert and oriented to person, place, and time.     Motor: No abnormal muscle tone.  Psychiatric:        Mood and Affect: Mood normal.        Behavior: Behavior normal.        Thought Content: Thought content normal.        Judgment: Judgment normal.     Skin lesion on right shoulder:      CBC:    BMET Recent Labs    07/26/21 0421 07/27/21 0449  NA 141 135  K 3.7 4.8  CL 100 95*  CO2 34* 29  GLUCOSE 88 138*  BUN 20 20  CREATININE 1.43* 1.25*  CALCIUM 10.9* 9.9  Liver Panel  Recent Labs    07/26/21 0421 07/27/21 0449  PROT 6.3* 6.0*  ALBUMIN 2.3* 2.3*  AST 18 18  ALT 12 11  ALKPHOS 245* 218*  BILITOT 2.2* 1.7*        Sedimentation  Rate No results for input(s): ESRSEDRATE in the last 72 hours.  C-Reactive Protein No results for input(s): CRP in the last 72 hours.   Micro Results: Recent Results (from the past 720 hour(s))  Urine Culture     Status: Abnormal   Collection Time: 07/01/21  2:39 PM   Specimen: Urine, Clean Catch  Result Value Ref Range Status   Specimen Description   Final    URINE, CLEAN CATCH Performed at [facility], [place] [address], [place], [place] [address]    Special Requests   Final    NONE Performed at [facility], [place] [address], [place], [place] [address]    Culture MULTIPLE SPECIES PRESENT, SUGGEST RECOLLECTION (A)  Final   Report Status 07/02/2021 FINAL  Final  SARS CORONAVIRUS 2 (TAT 6-24 HRS) Nasopharyngeal Nasopharyngeal Swab     Status: None   Collection Time: 07/02/21 11:19 AM   Specimen: Nasopharyngeal Swab  Result Value Ref Range Status   SARS Coronavirus 2 NEGATIVE NEGATIVE Final    Comment: (NOTE) SARS-CoV-2 target nucleic acids are NOT DETECTED.  The SARS-CoV-2 RNA is generally detectable in upper and lower respiratory specimens during the acute phase of infection. Negative results do not preclude SARS-CoV-2 infection, do not rule out co-infections with other pathogens, and should not be used as the sole basis for treatment or other patient management decisions. Negative results must be combined with clinical observations, patient history, and epidemiological information. The expected result is Negative.  Fact Sheet for Patients: SugarRoll.be  Fact Sheet for Healthcare Providers: https://www.woods-mathews.com/  This test is not yet approved or cleared by the [place] FDA and  has been authorized for detection and/or diagnosis of SARS-CoV-2 by FDA under an Emergency Use Authorization (EUA). This EUA will remain  in effect (meaning this test can be used) for the duration of  the COVID-19 declaration under Se ction 564(b)(1) of the Act, 21 U.S.C. section 360bbb-3(b)(1), unless the authorization is terminated or revoked sooner.  Performed at [facility], [place] [address] [place], [place] [address]   Resp Panel by RT-PCR (Flu A&B, Covid) Nasopharyngeal Swab     Status: None   Collection Time: 07/13/21  6:05 PM   Specimen: Nasopharyngeal Swab; Nasopharyngeal(NP) swabs in vial transport medium  Result Value Ref Range Status   SARS Coronavirus 2 by RT PCR NEGATIVE NEGATIVE Final    Comment: (NOTE) SARS-CoV-2 target nucleic acids are NOT DETECTED.  The SARS-CoV-2 RNA is generally detectable in upper respiratory specimens during the acute phase of infection. The lowest concentration of SARS-CoV-2 viral copies this assay can detect is 138 copies/mL. A negative result does not preclude SARS-Cov-2 infection and should not be used as the sole basis for treatment or other patient management decisions. A negative result may occur with  improper specimen collection/handling, submission of specimen other than nasopharyngeal swab, presence of viral mutation(s) within the areas targeted by this assay, and inadequate number of viral copies(<138 copies/mL). A negative result must be combined with clinical observations, patient history, and epidemiological information. The expected result is Negative.  Fact Sheet for Patients:  EntrepreneurPulse.com.au  Fact Sheet for Healthcare Providers:  IncredibleEmployment.be  This test is no t yet approved or  cleared by the [place] and  has been authorized for detection and/or diagnosis of SARS-CoV-2 by FDA under an Emergency Use Authorization (EUA). This EUA will remain  in effect (meaning this test can be used) for the duration of the COVID-19 declaration under Section 564(b)(1) of the Act, 21 U.S.C.section 360bbb-3(b)(1), unless the authorization is terminated  or revoked  sooner.       Influenza A by PCR NEGATIVE NEGATIVE Final   Influenza B by PCR NEGATIVE NEGATIVE Final    Comment: (NOTE) The Xpert Xpress SARS-CoV-2/FLU/RSV plus assay is intended as an aid in the diagnosis of influenza from Nasopharyngeal swab specimens and should not be used as a sole basis for treatment. Nasal washings and aspirates are unacceptable for Xpert Xpress SARS-CoV-2/FLU/RSV testing.  Fact Sheet for Patients: EntrepreneurPulse.com.au  Fact Sheet for Healthcare Providers: IncredibleEmployment.be  This test is not yet approved or cleared by the [place] FDA and has been authorized for detection and/or diagnosis of SARS-CoV-2 by FDA under an Emergency Use Authorization (EUA). This EUA will remain in effect (meaning this test can be used) for the duration of the COVID-19 declaration under Section 564(b)(1) of the Act, 21 U.S.C. section 360bbb-3(b)(1), unless the authorization is terminated or revoked.  Performed at [facility], [place] [address], [place], [place] [address]   Aerobic/Anaerobic Culture w Gram Stain (surgical/deep wound)     Status: None   Collection Time: 07/16/21  4:20 PM   Specimen: Abscess  Result Value Ref Range Status   Specimen Description   Final    ABSCESS RIGHT ARM Performed at [place] [address], [place], [place] [address]    Special Requests   Final    NONE Performed at [facility], [place] [address] [place], [place] [address]    Gram Stain   Final    ABUNDANT WBC PRESENT,BOTH PMN AND MONONUCLEAR NO ORGANISMS SEEN    Culture   Final    No growth aerobically or anaerobically. Performed at [facility], [place] [address] [place], [place] [address]    Report Status 07/21/2021 FINAL  Final  Aerobic/Anaerobic Culture w Gram Stain (surgical/deep wound)     Status: None   Collection Time: 07/16/21  4:25 PM   Specimen: Joint, Right  Shoulder; Synovial Fluid  Result Value Ref Range Status   Specimen Description   Final    JOINT FLUID RIGHT SHOULDER Performed at [place] [address], [place], [place] [address]    Special Requests   Final    NONE Performed at [facility], [place] [address] [place], [place] [address]    Gram Stain   Final    FEW WBC PRESENT, PREDOMINANTLY PMN NO ORGANISMS SEEN    Culture   Final    No growth aerobically or anaerobically. Performed at [facility], [place] [address], [place], [place] [address]    Report Status 07/21/2021 FINAL  Final  Resp Panel by RT-PCR (Flu A&B, Covid) Nasopharyngeal Swab     Status: None   Collection Time: 07/17/21 11:13 AM   Specimen: Nasopharyngeal Swab; Nasopharyngeal(NP) swabs in vial transport medium  Result Value Ref Range Status   SARS Coronavirus 2 by RT PCR NEGATIVE NEGATIVE Final    Comment: (NOTE) SARS-CoV-2 target nucleic acids are NOT DETECTED.  The SARS-CoV-2 RNA is generally detectable in upper respiratory specimens during the acute phase of infection. The lowest concentration of SARS-CoV-2 viral  copies this assay can detect is 138 copies/mL. A negative result does not preclude SARS-Cov-2 infection and should not be used as the sole basis for treatment or other patient management decisions. A negative result may occur with  improper specimen collection/handling, submission of specimen other than nasopharyngeal swab, presence of viral mutation(s) within the areas targeted by this assay, and inadequate number of viral copies(<138 copies/mL). A negative result must be combined with clinical observations, patient history, and epidemiological information. The expected result is Negative.  Fact Sheet for Patients:  EntrepreneurPulse.com.au  Fact Sheet for Healthcare Providers:  IncredibleEmployment.be  This test is no t yet approved or cleared by the [place] FDA and  has been authorized for detection and/or diagnosis of SARS-CoV-2 by FDA under an Emergency Use Authorization (EUA). This EUA will remain  in effect (meaning this test can be used) for the duration of the COVID-19 declaration under Section 564(b)(1) of the Act, 21 U.S.C.section 360bbb-3(b)(1), unless the authorization is terminated  or revoked sooner.       Influenza A by PCR NEGATIVE NEGATIVE Final   Influenza B by PCR NEGATIVE NEGATIVE Final    Comment: (NOTE) The Xpert Xpress SARS-CoV-2/FLU/RSV plus assay is intended as an aid in the diagnosis of influenza from Nasopharyngeal swab specimens and should not be used as a sole basis for treatment. Nasal washings and aspirates are unacceptable for Xpert Xpress SARS-CoV-2/FLU/RSV testing.  Fact Sheet for Patients: EntrepreneurPulse.com.au  Fact Sheet for Healthcare Providers: IncredibleEmployment.be  This test is not yet approved or cleared by the [place] FDA and has been authorized for detection and/or diagnosis of SARS-CoV-2 by FDA under an Emergency Use Authorization (EUA). This EUA will remain in effect (meaning this test can be used) for the duration of the COVID-19 declaration under Section 564(b)(1) of the Act, 21 U.S.C. section 360bbb-3(b)(1), unless the authorization is terminated or revoked.  Performed at [facility], [place] [address], [place], [place] [address]   Surgical PCR screen     Status: None   Collection Time: 07/25/21  5:49 PM   Specimen: Nasal Mucosa; Nasal Swab  Result Value Ref Range Status   MRSA, PCR NEGATIVE NEGATIVE Final   Staphylococcus aureus NEGATIVE NEGATIVE Final    Comment: (NOTE) The Xpert SA Assay (FDA approved for NASAL specimens in patients 20 years of age and older), is one component of a comprehensive surveillance program. It is not intended to diagnose infection nor to guide or monitor treatment. Performed at  [facility], [place] [address], [place], [place] [address]   Aerobic/Anaerobic Culture w Gram Stain (surgical/deep wound)     Status: None (Preliminary result)   Collection Time: 07/26/21  2:37 PM   Specimen: Soft Tissue, Other  Result Value Ref Range Status   Specimen Description   Final    TISSUE SHOULDER RIGHT 1 Performed at [place] [address], [place], [place] [address]    Special Requests   Final    NONE Performed at [facility], [place] [address], [place], [place] [address]    Gram Stain   Final    FEW WBC PRESENT, PREDOMINANTLY PMN NO ORGANISMS SEEN    Culture   Final    NO GROWTH < 24 HOURS Performed at [place] [facility] [address] [place], [place] [address]    Report Status PENDING  Incomplete  Aerobic/Anaerobic Culture w Gram Stain (surgical/deep wound)     Status: None (Preliminary  result)   Collection Time: 07/26/21  2:38 PM   Specimen: Soft Tissue, Other  Result Value Ref Range Status   Specimen Description   Final    TISSUE SHOULDER RIGHT 2 Performed at [facility] [address], [place], [place] [address]    Special Requests   Final    NONE Performed at [facility], [place] [address], [place], [place] [address]    Gram Stain   Final    FEW WBC PRESENT, PREDOMINANTLY PMN NO ORGANISMS SEEN    Culture   Final    NO GROWTH < 24 HOURS Performed at [place] [address], [place], [place] [address]    Report Status PENDING  Incomplete    Studies/Results: DG Shoulder Right Port  Result Date: 07/26/2021 CLINICAL DATA:  Postop right shoulder. EXAM: PORTABLE RIGHT SHOULDER COMPARISON:  Shoulder CT 07/19/2021 FINDINGS: Previous right shoulder arthroplasty has been removed. Surgical drain is in place. Chronic changes about the acromioclavicular joint. Previously demonstrated fracture through the base of the acromion is grossly similar common but  not well assessed on this portable view. IMPRESSION: 1. Explantation of right shoulder arthroplasty. Surgical drain in place. 2. Previously demonstrated fracture through the base of the acromion was better assessed on prior CT. Electronically Signed   By: Keith Rake M.D.   On: 07/26/2021 16:58      Assessment/Plan:  INTERVAL HISTORY: Patient is status post surgery with I&D and resection arthroplasty with cultures taken in the operating room   Principal Problem:   Abscess of right shoulder Active Problems:   Essential hypertension   Chronic atrial fibrillation (HCC)   Lymphedema   Malignant neoplasm of upper-outer quadrant of left breast in female, estrogen receptor positive ([place])   Anemia of chronic disease   Acute renal failure superimposed on stage 3b chronic kidney disease (HCC)   Morbid obesity (HCC)   GERD (gastroesophageal reflux disease)   Bilateral pain of leg and foot   Hypothyroidism   Pressure injury of skin   History of pacemaker   Decompensated hepatic cirrhosis ([place])   Abscess   Prosthetic joint infection (HCC)    Katelyn Jackson is a 75 y.o. female with [place] medical problems including atrial fibrillation on Coumadin, morbid obesity chronic lymphedema admitted with systemic infections concerning for infection and with significant fluid above and in the right shoulder where she has had placement of prosthetic shoulder in May 2022, also with pacemaker that was placed this year.  #1  Septic shoulder infection: She is status post I&D with resection arthroplasty and cultures taken in the operating room  I will start vancomycin though I have some anxiety about her renal function.  Daptomycin would be another option but not typically 1 that is covered in most of the skilled nursing facilities  If we do not grow any organism I will likely want to go with combination of either vancomycin and ceftriaxone or perhaps doxycyline orally with ceftriaxone.  I spent more  than 35 minutes with the patient including  face to face counseling of the patient re her septic shoulder personally reviewing xray right shoulder , along wculture data and along with review of medical records in preparation for the visit and during the visit and in coordination of her care.     LOS: 25 days   Alcide Evener 07/27/2021, 2:42 PM

## 2021-07-27 NOTE — TOC Benefit Eligibility Note (Signed)
Patient Advocate Encounter  Prior Authorization for Xifaxan 550 mg has been approved.    PA# [id number] Effective dates: 07/27/2021 through 11/06/2021  Patients co-pay is $4.00.     Lyndel Safe, [place] Patient Advocate Specialist [place] [facility]icrobial Stewardship Team Direct Number: [phone]  Fax: [phone]

## 2021-07-27 NOTE — Progress Notes (Signed)
Pharmacy Antibiotic Note  Katelyn Jackson is a 75 y.o. female admitted on 07/01/2021 with  septic joint infection .  Pharmacy has been consulted for vancomycin dosing.   ID following. Pt underwent washout and removal of hardware on 9/19.  Today, 07/27/21 WBC WNL SCr 1.25, CrCl ~45 mL/min Afebrile  Plan: Vancomycin 2000 mg IV loading dose followed by 750 mg IV q24h for estimated AUC of 472 Follow culture data, renal function. Check vancomycin level at steady state. Goal vancomycin AUC = 400-550.  Height: 5\' 1"  (154.9 cm) Weight: 110.5 kg (243 lb 9.7 oz) IBW/kg (Calculated) : 47.8  Temp (24hrs), Avg:98.3 F (36.8 C), Min:98 F (36.7 C), Max:98.7 F (37.1 C)  Recent Labs  Lab 07/23/21 0416 07/24/21 0517 07/25/21 0525 07/26/21 0421 07/27/21 0449  WBC 5.2 6.0 6.0 4.9 5.2  CREATININE 1.98* 1.55* 1.51* 1.43* 1.25*    Estimated Creatinine Clearance: 44.8 mL/min (A) (by C-G formula based on SCr of 1.25 mg/dL (H)).    Allergies  Allergen Reactions   Chlorhexidine Gluconate Hives   Penicillins Itching    Has patient had a PCN reaction causing immediate rash, facial/tongue/throat swelling, SOB or lightheadedness with hypotension: no Has patient had a PCN reaction causing severe rash involving mucus membranes or skin necrosis: No Has patient had a PCN reaction that required hospitalization: No Has patient had a PCN reaction occurring within the last 10 years: No If all of the above answers are "NO", then may proceed with Cephalosporin use. Tolerated Cephalosporin Date: 03/17/21. Td Ancef 2g 07/26/21    Thank you for allowing pharmacy to be a part of this patient's care.  Lenis Noon, PharmD 07/27/2021 2:05 PM

## 2021-07-27 NOTE — Progress Notes (Signed)
   ORTHOPAEDIC PROGRESS NOTE  s/p Procedure(s): RIGHT SHOULDER HARDWARE REMOVAL WITH WASHOUT  SUBJECTIVE: Reports mild pain about operative site. No chest pain. No SOB. No nausea/vomiting. No other complaints.  OBJECTIVE: PE: General: laying in hospital bed, NAD RUE: prevena wound vac in place with good seal, about 25 cc of bloody drainage noted in canister, hemovac also in place with about 100 cc of bloody drainge in canister, ROM not tested in early post-op setting, she endorses distal sensation, warm well perfused hand  Vitals:   07/27/21 0743 07/27/21 1216  BP: (!) 115/51 116/62  Pulse: 72 72  Resp: 18 16  Temp: 98.1 F (36.7 C) 98.5 F (36.9 C)  SpO2: 99% 98%     ASSESSMENT: Katelyn Jackson is a 75 y.o. female doing well postoperatively. POD#1  PLAN: Weightbearing: NWB RUE okay to use right arm for transfers and ADLs Insicional and dressing care: Leave hemovac and prevena in place Orthopedic device(s): Hemovac and wound vac. Sling Showering: Hold for now VTE prophylaxis: Okay to resume Eliquis once hemoglobin stable Pain control: PRN pain medications, preferring oral medications Follow - up plan: 1 week in office Dispo: SNF Contact information:  Dr. Ophelia Charter, [name] PA-C , After hours and holidays please check [email or web address] for group call information for [facility]  [name], PA-C 07/27/2021

## 2021-07-28 ENCOUNTER — Inpatient Hospital Stay: Payer: Self-pay

## 2021-07-28 DIAGNOSIS — L0291 Cutaneous abscess, unspecified: Secondary | ICD-10-CM | POA: Diagnosis not present

## 2021-07-28 DIAGNOSIS — G9341 Metabolic encephalopathy: Secondary | ICD-10-CM | POA: Diagnosis not present

## 2021-07-28 DIAGNOSIS — L02413 Cutaneous abscess of right upper limb: Secondary | ICD-10-CM | POA: Diagnosis not present

## 2021-07-28 DIAGNOSIS — L03116 Cellulitis of left lower limb: Secondary | ICD-10-CM | POA: Diagnosis not present

## 2021-07-28 LAB — PROTIME-INR
INR: 1.6 — ABNORMAL HIGH (ref 0.8–1.2)
Prothrombin Time: 19.4 seconds — ABNORMAL HIGH (ref 11.4–15.2)

## 2021-07-28 LAB — BASIC METABOLIC PANEL
Anion gap: 7 (ref 5–15)
BUN: 22 mg/dL (ref 8–23)
CO2: 29 mmol/L (ref 22–32)
Calcium: 10.2 mg/dL (ref 8.9–10.3)
Chloride: 99 mmol/L (ref 98–111)
Creatinine, Ser: 1.23 mg/dL — ABNORMAL HIGH (ref 0.44–1.00)
GFR, Estimated: 46 mL/min — ABNORMAL LOW (ref >60)
Glucose, Bld: 111 mg/dL — ABNORMAL HIGH (ref 70–99)
Potassium: 4.1 mmol/L (ref 3.5–5.1)
Sodium: 135 mmol/L (ref 135–145)

## 2021-07-28 LAB — CBC WITH DIFFERENTIAL/PLATELET
Abs Immature Granulocytes: 0.08 10*3/uL — ABNORMAL HIGH (ref 0.00–0.07)
Basophils Absolute: 0 10*3/uL (ref 0.0–0.1)
Basophils Relative: 0 %
Eosinophils Absolute: 0 10*3/uL (ref 0.0–0.5)
Eosinophils Relative: 0 %
HCT: 30.4 % — ABNORMAL LOW (ref 36.0–46.0)
Hemoglobin: 9.2 g/dL — ABNORMAL LOW (ref 12.0–15.0)
Immature Granulocytes: 1 %
Lymphocytes Relative: 16 %
Lymphs Abs: 1.1 10*3/uL (ref 0.7–4.0)
MCH: 29.5 pg (ref 26.0–34.0)
MCHC: 30.3 g/dL (ref 30.0–36.0)
MCV: 97.4 fL (ref 80.0–100.0)
Monocytes Absolute: 0.6 10*3/uL (ref 0.1–1.0)
Monocytes Relative: 8 %
Neutro Abs: 5.2 10*3/uL (ref 1.7–7.7)
Neutrophils Relative %: 75 %
Platelets: 167 10*3/uL (ref 150–400)
RBC: 3.12 MIL/uL — ABNORMAL LOW (ref 3.87–5.11)
RDW: 16.2 % — ABNORMAL HIGH (ref 11.5–15.5)
WBC: 7 10*3/uL (ref 4.0–10.5)
nRBC: 0 % (ref 0.0–0.2)

## 2021-07-28 LAB — MAGNESIUM: Magnesium: 2.4 mg/dL (ref 1.7–2.4)

## 2021-07-28 MED ORDER — OXYCODONE HCL 5 MG PO TABS: ORAL_TABLET | ORAL | 0 refills | Status: DC

## 2021-07-28 MED ORDER — APIXABAN 5 MG PO TABS
5.0000 mg | ORAL_TABLET | Freq: Two times a day (BID) | ORAL | Status: DC
  Administered 2021-07-28 – 2021-07-30 (×5): 5 mg via ORAL
  Filled 2021-07-28 (×5): qty 1

## 2021-07-28 MED ORDER — SODIUM CHLORIDE 0.9 % IV SOLN
2.0000 g | INTRAVENOUS | Status: DC
  Administered 2021-07-28 – 2021-07-30 (×3): 2 g via INTRAVENOUS
  Filled 2021-07-28 (×3): qty 20

## 2021-07-28 NOTE — Progress Notes (Signed)
Referring Physician(s): Lucianne Lei Dam,C  Supervising Physician: Jacqulynn Cadet  [name]  Chief Complaint: Right prosthetic shoulder infection; in need of central venous access for long term IV antibiotics   Subjective: Patient familiar to IR service from image guided aspiration of complex fluid collection right upper arm as well as glenohumeral joint on 07/16/2021.  She has a history of atrial fibrillation on outpatient Coumadin, now Eliquis, left breast carcinoma , chronic bilateral lower extremity lymphedema, anemia, chronic kidney disease, obesity, GERD, hypothyroidism, prior pacemaker placement, cirrhosis, now with right prosthetic shoulder joint infection.  She had placement of prosthetic shoulder in May of this year and is status post I&D and resection arthroplasty on 9/19.  Request now received from ID team for central venous access for long-term IV antibiotic therapy.  Currently denies fever, headache, chest pain, worsening dyspnea, cough, abdominal pain, [name]   Past Medical History:  Diagnosis Date   Arthritis    Cancer [facility])    breast cancer   Chronic atrial fibrillation (HCC)    Chronic kidney disease    sees Dr Florene Glen   Cramps, muscle, general    Dyspnea on exertion    Dysrhythmia    a-fib,    GERD (gastroesophageal reflux disease)    Headache    Hyperlipidemia    Hypertension    Hypothyroidism    Lymphedema    Moderate to severe pulmonary hypertension (HCC)    Obesity    Personal history of radiation therapy    Pneumonia 12/2020   Presence of permanent cardiac pacemaker 01/21/2021   for bradycardia    Syncope 12/2020   needed a pacemaker   Past Surgical History:  Procedure Laterality Date   APPENDECTOMY     BREAST BIOPSY Left 2018   BREAST LUMPECTOMY Left 04/04/2017   x3   BREAST LUMPECTOMY WITH NEEDLE  LOCALIZATION AND AXILLARY SENTINEL LYMPH NODE BX Left 04/04/2017   Procedure: BREAST LUMPECTOMY WITH NEEDLE LOCALIZATION x3 AND AXILLARY SENTINEL LYMPH NODE BX;  Surgeon: [name], MD;  Location: [place];  Service: General;  Laterality: Left;   CATARACT EXTRACTION W/ INTRAOCULAR LENS  IMPLANT, BILATERAL Bilateral 2018   COLONOSCOPY     EYE SURGERY     GLAUCOMA SURGERY Bilateral 2018   HARDWARE REMOVAL Right 07/26/2021   Procedure: RIGHT SHOULDER HARDWARE REMOVAL WITH WASHOUT;  Surgeon: Hiram Gash, MD;  Location: [facility];  Service: Orthopedics;  Laterality: Right;   INSERT / REPLACE / REMOVE PACEMAKER  01/21/2021   IR US GUIDE BX ASP/DRAIN  07/16/2021   RE-EXCISION OF BREAST CANCER,SUPERIOR MARGINS Left 04/26/2017   Procedure: RE-EXCISION OF LEFT BREAST CANCER;  Surgeon: [name], MD;  Location: [place];  Service: General;  Laterality: Left;   SHOULDER HEMI-ARTHROPLASTY Right 03/17/2021   Procedure: SHOULDER HEMI-ARTHROPLASTY;  Surgeon: Hiram Gash, MD;  Location: [facility];  Service: Orthopedics;  Laterality: Right;      Allergies: Chlorhexidine gluconate and Penicillins  Medications: Prior to Admission medications   Medication Sig Start Date End Date Taking? Authorizing Provider  allopurinol (ZYLOPRIM) 100 MG tablet Take 0.5 tablets (50 mg total) by mouth daily. Patient taking differently: Take 100 mg by mouth daily. 10/08/20  Yes Plotnikov, [name], MD  carvedilol (COREG) 25 MG tablet Take 1 tablet (25 mg total) by mouth 2 (two) times daily. 05/11/21 05/11/22  Yes Plotnikov, [name], MD  Cholecalciferol (VITAMIN D3) 2000 units capsule Take 1 capsule (2,000 Units total) by mouth daily. 07/11/17  Yes Plotnikov, [name], MD  Cyanocobalamin (VITAMIN B-12 PO) Take 2,000 mcg by mouth daily.   Yes [provider]  diclofenac Sodium (VOLTAREN) 1 % GEL APPLY FOUR GRAMS FOUR TIMES DAILY AS NEEDED Patient taking differently: Apply 4 g topically 4 (four) times daily as needed (pain).  06/11/21  Yes Plotnikov, [name], MD  exemestane (AROMASIN) 25 MG tablet Take 1 tablet (25 mg total) by mouth daily after breakfast. 10/08/20  Yes Plotnikov, [name], MD  levothyroxine (SYNTHROID) 50 MCG tablet Take 1 tablet (50 mcg total) by mouth daily before breakfast. Overdue for Annual appt must see provider for future refills 02/09/21  Yes Plotnikov, [name], MD  lipase/protease/amylase (CREON) 12000-38000 units CPEP capsule Take 1 capsule (12,000 Units total) by mouth daily as needed (stomach problems). 10/08/20  Yes Plotnikov, [name], MD  lovastatin (MEVACOR) 20 MG tablet Take 1 tablet (20 mg total) by mouth daily. 10/08/20  Yes Plotnikov, [name], MD  magnesium oxide (MAG-OX) 400 MG tablet Take 400 mg by mouth 2 (two) times daily.   Yes [provider]  methocarbamol (ROBAXIN) 500 MG tablet Take 1 tablet (500 mg total) by mouth every 8 (eight) hours as needed for muscle spasms. 03/19/21  Yes McBane, Maylene Roes, PA-C  omeprazole (PRILOSEC) 40 MG capsule Take 1 capsule (40 mg total) by mouth daily. Overdue for Annual appt must see provider for future refills Patient taking differently: Take 40 mg by mouth daily as needed (acid reflux). Overdue for Annual appt must see provider for future refills 10/08/20  Yes Plotnikov, [name], MD  OVER THE COUNTER MEDICATION Apply 1 application topically daily as needed (apply to buttocks  as needed for irritaiton). Intensive Skin Care Therapy   Yes [provider]  polyethylene glycol powder (GLYCOLAX/MIRALAX) powder mix 1 capful (17 grams) IN 8 ounces OF liquid EVERY DAY Patient taking differently: Take 17 g by mouth daily as needed for mild constipation. 11/05/18  Yes Plotnikov, [name], MD  potassium chloride (KLOR-CON) 8 MEQ tablet Take 1 tablet (8 mEq total) by mouth daily. 10/08/20  Yes Plotnikov, [name], MD  torsemide (DEMADEX) 20 MG tablet Take 60 mg every morning and 40 mg in the pm (2 pm) 05/11/21  Yes Plotnikov, [name], MD   traMADol (ULTRAM) 50 MG tablet Take 1 tablet (50 mg total) by mouth every 8 (eight) hours as needed for severe pain. 03/19/21  Yes McBane, Maylene Roes, PA-C  warfarin (COUMADIN) 5 MG tablet Take 1 tablet daily except take 1/2 tablet on Wed and Sat or Take as directed by anticoagulation clinic Patient taking differently: Take 2.5-5 mg by mouth See admin instructions. 2.5mg  every day except for Tuesday's. Tuesday's take 5mg . Total weekly dose of 20mg . 10/08/20  Yes Plotnikov, [name], MD  oxyCODONE (OXY IR/ROXICODONE) 5 MG immediate release tablet Take 1 pills every 6 hrs as needed for severe pain 07/28/21   McBane, Maylene Roes, PA-C  tamoxifen (NOLVADEX) 20 MG tablet Take 1 tablet (20 mg total) by mouth daily. Patient taking differently: Take 20 mg by mouth daily. Will take after finishing Exemestane 06/10/21   Truitt Merle, MD     Vital Signs: BP (!) 107/57 (BP Location: Left Arm)   Pulse 62   Temp 98.4 F (36.9 C)   Resp 16   Ht 5\' 1"  (1.549 m)  Wt 238 lb 5.1 oz (108.1 kg)   LMP  (LMP Unknown)   SpO2 99%   BMI 45.03 kg/m   Physical Exam patient awake, follows commands.  Chest clear to auscultation bilaterally.  Heart with irregularly irregular rhythm.  Left chest wall pacer in place.  Abdomen obese, soft, positive bowel sounds, nontender.  Right upper extremity in sling, right shoulder with wound VAC in place.  Bilateral ankle edema.  Imaging: DG Shoulder Right Port  Result Date: 07/26/2021 CLINICAL DATA:  Postop right shoulder. EXAM: PORTABLE RIGHT SHOULDER COMPARISON:  Shoulder CT 07/19/2021 FINDINGS: Previous right shoulder arthroplasty has been removed. Surgical drain is in place. Chronic changes about the acromioclavicular joint. Previously demonstrated fracture through the base of the acromion is grossly similar common but not well assessed on this portable view. IMPRESSION: 1. Explantation of right shoulder arthroplasty. Surgical drain in place. 2. Previously demonstrated fracture  through the base of the acromion was better assessed on prior CT. Electronically Signed   By: Keith Rake M.D.   On: 07/26/2021 16:58   [place] EKG SITE RITE  Result Date: 07/28/2021 If Site Rite image not attached, placement could not be confirmed due to current cardiac rhythm.   Labs:  CBC: Recent Labs    07/25/21 0525 07/26/21 0421 07/27/21 0449 07/28/21 0413  WBC 6.0 4.9 5.2 7.0  HGB 10.3* 10.1* 9.6* 9.2*  HCT 33.2* 32.9* 31.5* 30.4*  PLT 195 201 194 167    COAGS: Recent Labs    03/17/21 1205 03/18/21 0324 07/25/21 0525 07/26/21 0421 07/27/21 0449 07/28/21 0413  INR 2.1*   < > 1.3* 1.4* 1.4* 1.6*  APTT 29  --   --   --   --   --    < > = values in this interval not displayed.    BMP: Recent Labs    07/25/21 0525 07/26/21 0421 07/27/21 0449 07/28/21 0413  NA 137 141 135 135  K 3.9 3.7 4.8 4.1  CL 94* 100 95* 99  CO2 31 34* 29 29  GLUCOSE 95 88 138* 111*  BUN 24* 20 20 22   CALCIUM 98.9 10.9* 9.9 10.2  CREATININE 1.51* 1.43* 1.25* 1.23*  GFRNONAA 36* 38* 45* 46*    LIVER FUNCTION TESTS: Recent Labs    07/24/21 0517 07/25/21 0525 07/26/21 0421 07/27/21 0449  BILITOT 2.1* 2.0* 2.2* 1.7*  AST 20 22 18 18   ALT 15 14 12 11   ALKPHOS 273* 273* 245* 218*  PROT 6.5 6.5 6.3* 6.0*  ALBUMIN 2.5* 2.5* 2.3* 2.3*    Assessment and Plan: Patient familiar to IR service from image guided aspiration of complex fluid collection right upper arm as well as glenohumeral joint on 07/16/2021.  She has a history of atrial fibrillation on outpatient Coumadin, now Eliquis, left breast carcinoma , chronic bilateral lower extremity lymphedema, anemia, chronic kidney disease, obesity, GERD, hypothyroidism, prior pacemaker placement, cirrhosis, now with right prosthetic shoulder joint infection.  She had placement of prosthetic shoulder in May of this year and is status post I&D and resection arthroplasty on 9/19.  Request now received from ID team for central venous access for  long-term IV antibiotic therapy.  Details/risks of above procedure discussed with patient/son with their understanding and consent.  Procedure scheduled for 9/22.   Electronically Signed: D. Rowe Robert, PA-C 07/28/2021, 4:11 PM   I spent a total of 20 minutes at the the patient's bedside AND on the patient's hospital floor or unit, greater than 50% of which  was counseling/coordinating care for tunneled central venous catheter placement    Patient ID: Katelyn Jackson, Katelyn Jackson   DOB: 09/09/1946, 75 y.o.   MRN: [id number]

## 2021-07-28 NOTE — Progress Notes (Addendum)
Subjective:  She says she continues to have improvement in her shoulder pain    Antibiotics:  Anti-infectives (From admission, onward)    Start     Dose/Rate Route Frequency Ordered Stop   07/28/21 1600  vancomycin (VANCOREADY) IVPB 750 mg/150 mL        750 mg 150 mL/hr over 60 Minutes Intravenous Every 24 hours 07/27/21 1405     07/28/21 1300  cefTRIAXone (ROCEPHIN) 2 g in sodium chloride 0.9 % 100 mL IVPB        2 g 200 mL/hr over 30 Minutes Intravenous Every 24 hours 07/28/21 1212     07/27/21 1500  vancomycin (VANCOREADY) IVPB 2000 mg/400 mL        2,000 mg 200 mL/hr over 120 Minutes Intravenous  Once 07/27/21 1404 07/27/21 1737   07/26/21 1830  ceFAZolin (ANCEF) IVPB 1 g/50 mL premix        1 g 100 mL/hr over 30 Minutes Intravenous Every 6 hours 07/26/21 1537 07/27/21 0610   07/26/21 1325  vancomycin (VANCOCIN) powder  Status:  Discontinued          As needed 07/26/21 1338 07/26/21 1514   07/26/21 1325  tobramycin (NEBCIN) powder  Status:  Discontinued          As needed 07/26/21 1338 07/26/21 1514   07/26/21 1100  ceFAZolin (ANCEF) IVPB 2g/100 mL premix        2 g 200 mL/hr over 30 Minutes Intravenous On call to O.R. 07/26/21 1055 07/26/21 1316   07/26/21 1056  ceFAZolin (ANCEF) 2-4 GM/100ML-% IVPB       Note to Pharmacy: Lavon Paganini   : cabinet override      07/26/21 1056 07/26/21 1309   07/22/21 1100  rifaximin (XIFAXAN) tablet 550 mg        550 mg Oral 2 times daily 07/22/21 1005     07/16/21 1200  vancomycin (VANCOREADY) IVPB 750 mg/150 mL  Status:  Discontinued        750 mg 150 mL/hr over 60 Minutes Intravenous Every 24 hours 07/15/21 1242 07/19/21 1537   07/15/21 1400  vancomycin (VANCOREADY) IVPB 2000 mg/400 mL        2,000 mg 200 mL/hr over 120 Minutes Intravenous  Once 07/15/21 1242 07/15/21 1522   07/10/21 1530  doxycycline (VIBRA-TABS) tablet 100 mg  Status:  Discontinued        100 mg Oral Every 12 hours 07/10/21 1437 07/15/21 1127    07/05/21 1245  cefadroxil (DURICEF) capsule 500 mg        500 mg Oral 2 times daily 07/05/21 1155 07/07/21 2217   07/03/21 0900  cefTRIAXone (ROCEPHIN) 2 g in sodium chloride 0.9 % 100 mL IVPB  Status:  Discontinued        2 g 200 mL/hr over 30 Minutes Intravenous Every 24 hours 07/02/21 0950 07/05/21 1155   07/02/21 0915  cefTRIAXone (ROCEPHIN) 2 g in sodium chloride 0.9 % 100 mL IVPB        2 g 200 mL/hr over 30 Minutes Intravenous  Once 07/02/21 0909 07/02/21 1033       Medications: Scheduled Meds:  carvedilol  6.25 mg Oral BID WC   cholecalciferol  2,000 Units Oral Daily   docusate sodium  100 mg Oral BID   exemestane  25 mg Oral QPC breakfast   lactulose  10 g Oral TID   levothyroxine  50 mcg Oral QAC breakfast  mupirocin ointment  1 application Nasal BID   pantoprazole  40 mg Oral Daily   potassium chloride  20 mEq Oral Daily   pravastatin  20 mg Oral q1800   rifaximin  550 mg Oral BID   vitamin B-12  2,000 mcg Oral Daily   Continuous Infusions:  sodium chloride 250 mL (07/16/21 1319)   cefTRIAXone (ROCEPHIN)  IV     vancomycin     PRN Meds:.sodium chloride, acetaminophen **OR** acetaminophen, diclofenac Sodium, diphenhydrAMINE, HYDROmorphone (DILAUDID) injection, lipase/protease/amylase, menthol-cetylpyridinium **OR** phenol, ondansetron **OR** ondansetron (ZOFRAN) IV, oxyCODONE, oxyCODONE, polyvinyl alcohol, simethicone, witch hazel-glycerin    Objective: Weight change: -2.4 kg  Intake/Output Summary (Last 24 hours) at 07/28/2021 1233 Last data filed at 07/28/2021 0715 Gross per 24 hour  Intake 1120 ml  Output 731 ml  Net 389 ml    Blood pressure (!) 107/57, pulse 62, temperature 98.4 F (36.9 C), resp. rate 16, height _0  (1.549 m), weight 108.1 kg, SpO2 99 %. Temp:  [98 F (36.7 C)-98.4 F (36.9 C)] 98.4 F (36.9 C) (09/21 1110) Pulse Rate:  [58-63] 62 (09/21 1110) Resp:  [16-18] 16 (09/21 1110) BP: (106-127)/(54-65) 107/57 (09/21 1110) SpO2:  [98  %-99 %] 99 % (09/21 1110) Weight:  [108.1 kg] 108.1 kg (09/21 0414)  Physical Exam: Physical Exam Constitutional:      General: She is not in acute distress.    Appearance: She is well-developed. She is not diaphoretic.  HENT:     Head: Normocephalic and atraumatic.     Right Ear: External ear normal.     Left Ear: External ear normal.     Mouth/Throat:     Pharynx: No oropharyngeal exudate.  Eyes:     General: No scleral icterus.    Extraocular Movements: Extraocular movements intact.     Conjunctiva/sclera: Conjunctivae normal.  Cardiovascular:     Rate and Rhythm: Normal rate and regular rhythm.     Heart sounds: No murmur heard. Pulmonary:     Effort: Pulmonary effort is normal. No respiratory distress.     Breath sounds: No wheezing or rales.  Abdominal:     General: There is no distension.     Palpations: Abdomen is soft.     Tenderness: There is no abdominal tenderness.  Musculoskeletal:     Right lower leg: Edema present.     Left lower leg: Edema present.  Lymphadenopathy:     Cervical: No cervical adenopathy.  Skin:    General: Skin is warm and dry.     Coloration: Skin is not pale.     Findings: No erythema or rash.  Neurological:     General: No focal deficit present.     Mental Status: She is alert and oriented to person, place, and time.     Motor: No abnormal muscle tone.     Coordination: Coordination normal.  Psychiatric:        Mood and Affect: Mood normal.        Behavior: Behavior normal.        Thought Content: Thought content normal.        Judgment: Judgment normal.     Skin lesion on right shoulder:      CBC:    BMET Recent Labs    07/27/21 0449 07/28/21 0413  NA 135 135  K 4.8 4.1  CL 95* 99  CO2 29 29  GLUCOSE 138* 111*  BUN 20 22  CREATININE 1.25* 1.23*  CALCIUM 9.9 10.2  Liver Panel  Recent Labs    07/26/21 0421 07/27/21 0449  PROT 6.3* 6.0*  ALBUMIN 2.3* 2.3*  AST 18 18  ALT 12 11  ALKPHOS 245* 218*   BILITOT 2.2* 1.7*        Sedimentation Rate No results for input(s): ESRSEDRATE in the last 72 hours.  C-Reactive Protein No results for input(s): CRP in the last 72 hours.   Micro Results: Recent Results (from the past 720 hour(s))  Urine Culture     Status: Abnormal   Collection Time: 07/01/21  2:39 PM   Specimen: Urine, Clean Catch  Result Value Ref Range Status   Specimen Description   Final    URINE, CLEAN CATCH Performed at [facility], [place] [address] [place], [place] [address]    Special Requests   Final    NONE Performed at [facility], [place] [address], [place], [place] [address]    Culture MULTIPLE SPECIES PRESENT, SUGGEST RECOLLECTION (A)  Final   Report Status 07/02/2021 FINAL  Final  SARS CORONAVIRUS 2 (TAT 6-24 HRS) Nasopharyngeal Nasopharyngeal Swab     Status: None   Collection Time: 07/02/21 11:19 AM   Specimen: Nasopharyngeal Swab  Result Value Ref Range Status   SARS Coronavirus 2 NEGATIVE NEGATIVE Final    Comment: (NOTE) SARS-CoV-2 target nucleic acids are NOT DETECTED.  The SARS-CoV-2 RNA is generally detectable in upper and lower respiratory specimens during the acute phase of infection. Negative results do not preclude SARS-CoV-2 infection, do not rule out co-infections with other pathogens, and should not be used as the sole basis for treatment or other patient management decisions. Negative results must be combined with clinical observations, patient history, and epidemiological information. The expected result is Negative.  Fact Sheet for Patients: SugarRoll.be  Fact Sheet for Healthcare Providers: https://www.woods-mathews.com/  This test is not yet approved or cleared by the [place] FDA and  has been authorized for detection and/or diagnosis of SARS-CoV-2 by FDA under an Emergency Use Authorization (EUA). This EUA will remain  in effect (meaning  this test can be used) for the duration of the COVID-19 declaration under Se ction 564(b)(1) of the Act, 21 U.S.C. section 360bbb-3(b)(1), unless the authorization is terminated or revoked sooner.  Performed at [facility], [place] [address], [place], [place] [address]   Resp Panel by RT-PCR (Flu A&B, Covid) Nasopharyngeal Swab     Status: None   Collection Time: 07/13/21  6:05 PM   Specimen: Nasopharyngeal Swab; Nasopharyngeal(NP) swabs in vial transport medium  Result Value Ref Range Status   SARS Coronavirus 2 by RT PCR NEGATIVE NEGATIVE Final    Comment: (NOTE) SARS-CoV-2 target nucleic acids are NOT DETECTED.  The SARS-CoV-2 RNA is generally detectable in upper respiratory specimens during the acute phase of infection. The lowest concentration of SARS-CoV-2 viral copies this assay can detect is 138 copies/mL. A negative result does not preclude SARS-Cov-2 infection and should not be used as the sole basis for treatment or other patient management decisions. A negative result may occur with  improper specimen collection/handling, submission of specimen other than nasopharyngeal swab, presence of viral mutation(s) within the areas targeted by this assay, and inadequate number of viral copies(<138 copies/mL). A negative result must be combined with clinical observations, patient history, and epidemiological information. The expected result is Negative.  Fact Sheet for Patients:  EntrepreneurPulse.com.au  Fact Sheet for Healthcare Providers:  IncredibleEmployment.be  This test is no t yet approved or  cleared by the [place] and  has been authorized for detection and/or diagnosis of SARS-CoV-2 by FDA under an Emergency Use Authorization (EUA). This EUA will remain  in effect (meaning this test can be used) for the duration of the COVID-19 declaration under Section 564(b)(1) of the Act, 21 U.S.C.section 360bbb-3(b)(1), unless the  authorization is terminated  or revoked sooner.       Influenza A by PCR NEGATIVE NEGATIVE Final   Influenza B by PCR NEGATIVE NEGATIVE Final    Comment: (NOTE) The Xpert Xpress SARS-CoV-2/FLU/RSV plus assay is intended as an aid in the diagnosis of influenza from Nasopharyngeal swab specimens and should not be used as a sole basis for treatment. Nasal washings and aspirates are unacceptable for Xpert Xpress SARS-CoV-2/FLU/RSV testing.  Fact Sheet for Patients: EntrepreneurPulse.com.au  Fact Sheet for Healthcare Providers: IncredibleEmployment.be  This test is not yet approved or cleared by the [place] FDA and has been authorized for detection and/or diagnosis of SARS-CoV-2 by FDA under an Emergency Use Authorization (EUA). This EUA will remain in effect (meaning this test can be used) for the duration of the COVID-19 declaration under Section 564(b)(1) of the Act, 21 U.S.C. section 360bbb-3(b)(1), unless the authorization is terminated or revoked.  Performed at [facility], [place] [address] [place], [place] [address]   Aerobic/Anaerobic Culture w Gram Stain (surgical/deep wound)     Status: None   Collection Time: 07/16/21  4:20 PM   Specimen: Abscess  Result Value Ref Range Status   Specimen Description   Final    ABSCESS RIGHT ARM Performed at [place] [address] [place], [place] [address]    Special Requests   Final    NONE Performed at [facility], [place] [address] [place], [place] [address]    Gram Stain   Final    ABUNDANT WBC PRESENT,BOTH PMN AND MONONUCLEAR NO ORGANISMS SEEN    Culture   Final    No growth aerobically or anaerobically. Performed at [facility], [place] [address], [place], [place] [address]    Report Status 07/21/2021 FINAL  Final  Aerobic/Anaerobic Culture w Gram Stain (surgical/deep wound)     Status: None   Collection Time:  07/16/21  4:25 PM   Specimen: Joint, Right Shoulder; Synovial Fluid  Result Value Ref Range Status   Specimen Description   Final    JOINT FLUID RIGHT SHOULDER Performed at [place] [address], [place], [place] [address]    Special Requests   Final    NONE Performed at [facility], [place] [address], [place], [place] [address]    Gram Stain   Final    FEW WBC PRESENT, PREDOMINANTLY PMN NO ORGANISMS SEEN    Culture   Final    No growth aerobically or anaerobically. Performed at [facility], [place] [address], [place],  [address]    Report Status 07/21/2021 FINAL  Final  Resp Panel by RT-PCR (Flu A&B, Covid) Nasopharyngeal Swab     Status: None   Collection Time: 07/17/21 11:13 AM   Specimen: Nasopharyngeal Swab; Nasopharyngeal(NP) swabs in vial transport medium  Result Value Ref Range Status   SARS Coronavirus 2 by RT PCR NEGATIVE NEGATIVE Final    Comment: (NOTE) SARS-CoV-2 target nucleic acids are NOT DETECTED.  The SARS-CoV-2 RNA is generally detectable in upper respiratory specimens during the acute phase of infection. The lowest concentration of SARS-CoV-2 viral  copies this assay can detect is 138 copies/mL. A negative result does not preclude SARS-Cov-2 infection and should not be used as the sole basis for treatment or other patient management decisions. A negative result may occur with  improper specimen collection/handling, submission of specimen other than nasopharyngeal swab, presence of viral mutation(s) within the areas targeted by this assay, and inadequate number of viral copies(<138 copies/mL). A negative result must be combined with clinical observations, patient history, and epidemiological information. The expected result is Negative.  Fact Sheet for Patients:  EntrepreneurPulse.com.au  Fact Sheet for Healthcare Providers:  IncredibleEmployment.be  This test is  no t yet approved or cleared by the [place] FDA and  has been authorized for detection and/or diagnosis of SARS-CoV-2 by FDA under an Emergency Use Authorization (EUA). This EUA will remain  in effect (meaning this test can be used) for the duration of the COVID-19 declaration under Section 564(b)(1) of the Act, 21 U.S.C.section 360bbb-3(b)(1), unless the authorization is terminated  or revoked sooner.       Influenza A by PCR NEGATIVE NEGATIVE Final   Influenza B by PCR NEGATIVE NEGATIVE Final    Comment: (NOTE) The Xpert Xpress SARS-CoV-2/FLU/RSV plus assay is intended as an aid in the diagnosis of influenza from Nasopharyngeal swab specimens and should not be used as a sole basis for treatment. Nasal washings and aspirates are unacceptable for Xpert Xpress SARS-CoV-2/FLU/RSV testing.  Fact Sheet for Patients: EntrepreneurPulse.com.au  Fact Sheet for Healthcare Providers: IncredibleEmployment.be  This test is not yet approved or cleared by the [place] FDA and has been authorized for detection and/or diagnosis of SARS-CoV-2 by FDA under an Emergency Use Authorization (EUA). This EUA will remain in effect (meaning this test can be used) for the duration of the COVID-19 declaration under Section 564(b)(1) of the Act, 21 U.S.C. section 360bbb-3(b)(1), unless the authorization is terminated or revoked.  Performed at [facility], [place] [address] [place], [place] [address]   Surgical PCR screen     Status: None   Collection Time: 07/25/21  5:49 PM   Specimen: Nasal Mucosa; Nasal Swab  Result Value Ref Range Status   MRSA, PCR NEGATIVE NEGATIVE Final   Staphylococcus aureus NEGATIVE NEGATIVE Final    Comment: (NOTE) The Xpert SA Assay (FDA approved for NASAL specimens in patients 37 years of age and older), is one component of a comprehensive surveillance program. It is not intended to diagnose infection nor  to guide or monitor treatment. Performed at [facility], [place] [address], [place], [place] [address]   Aerobic/Anaerobic Culture w Gram Stain (surgical/deep wound)     Status: None (Preliminary result)   Collection Time: 07/26/21  2:37 PM   Specimen: Soft Tissue, Other  Result Value Ref Range Status   Specimen Description   Final    TISSUE SHOULDER RIGHT 1 Performed at [place] [address], [place], [place] [address]    Special Requests   Final    NONE Performed at [facility], [place] [address], [place], [place] [address]    Gram Stain   Final    FEW WBC PRESENT, PREDOMINANTLY PMN NO ORGANISMS SEEN    Culture   Final    NO GROWTH 2 DAYS NO ANAEROBES ISOLATED; CULTURE IN PROGRESS FOR 5 DAYS Performed at [place] [address], [place], [place] [address]    Report Status PENDING  Incomplete  Aerobic/Anaerobic Culture w Gram Stain (surgical/deep  wound)     Status: None (Preliminary result)   Collection Time: 07/26/21  2:38 PM   Specimen: Soft Tissue, Other  Result Value Ref Range Status   Specimen Description   Final    TISSUE SHOULDER RIGHT 2 Performed at [place] [address], [place], [place] [address]    Special Requests   Final    NONE Performed at [facility], [place] [address], [place], [place] [address]    Gram Stain   Final    FEW WBC PRESENT, PREDOMINANTLY PMN NO ORGANISMS SEEN    Culture   Final    NO GROWTH 2 DAYS NO ANAEROBES ISOLATED; CULTURE IN PROGRESS FOR 5 DAYS Performed at [place] [address], [place], [place] [address]    Report Status PENDING  Incomplete    Studies/Results: DG Shoulder Right Port  Result Date: 07/26/2021 CLINICAL DATA:  Postop right shoulder. EXAM: PORTABLE RIGHT SHOULDER COMPARISON:  Shoulder CT 07/19/2021 FINDINGS: Previous right shoulder arthroplasty has been removed. Surgical drain is in place.  Chronic changes about the acromioclavicular joint. Previously demonstrated fracture through the base of the acromion is grossly similar common but not well assessed on this portable view. IMPRESSION: 1. Explantation of right shoulder arthroplasty. Surgical drain in place. 2. Previously demonstrated fracture through the base of the acromion was better assessed on prior CT. Electronically Signed   By: Keith Rake M.D.   On: 07/26/2021 16:58   [place] EKG SITE RITE  Result Date: 07/28/2021 If Site Rite image not attached, placement could not be confirmed due to current cardiac rhythm.     Assessment/Plan:  INTERVAL HISTORY: Katelyn Jackson are unrevealing so far Principal Problem:   Abscess of right shoulder Active Problems:   Essential hypertension   Chronic atrial fibrillation (HCC)   Lymphedema   Malignant neoplasm of upper-outer quadrant of left breast in female, estrogen receptor positive (HCC)   Anemia of chronic disease   Acute renal failure superimposed on stage 3b chronic kidney disease (HCC)   Morbid obesity (HCC)   GERD (gastroesophageal reflux disease)   Bilateral pain of leg and foot   Hypothyroidism   Pressure injury of skin   History of pacemaker   Decompensated hepatic cirrhosis ([place])   Abscess   Prosthetic joint infection (HCC)    Katelyn Jackson is a 75 y.o. female with [place] medical problems including atrial fibrillation on Coumadin, morbid obesity chronic lymphedema admitted with systemic infections concerning for infection and with significant fluid above and in the right shoulder where she has had placement of prosthetic shoulder in May 2022, also with pacemaker that was placed this year.  #1 septic prosthetic shoulder status post I&D and resection arthroplasty  I have contacted Philippa Chester in the microbiology lab and he will extend the activation.  On the cultures to 21 days so that we can potentially isolate an organism such as Propionibacterium.  I am adding  ceftriaxone to her vancomycin and would plan for 6 weeks of therapy with follow-up with me in clinic prior to stopping her antibiotics  Diagnosis: Prosthetic shoulder infection  Culture Result: No growth so far  Allergies  Allergen Reactions   Chlorhexidine Gluconate Hives   Penicillins Itching    Has patient had a PCN reaction causing immediate rash, facial/tongue/throat swelling, SOB or lightheadedness with hypotension: no Has patient had a PCN reaction causing severe rash involving mucus membranes or skin necrosis: No Has patient had a PCN reaction that required hospitalization:  No Has patient had a PCN reaction occurring within the last 10 years: No If all of the above answers are "NO", then may proceed with Cephalosporin use. Tolerated Cephalosporin Date: 03/17/21. Td Ancef 2g 07/26/21    OPAT Orders Discharge antibiotics:  Ceftriaxone 2 g daily IV along with  Vancomycin per pharmacy protocol  Aim for Vancomycin trough 15-20 (unless otherwise indicated)   Duration:  6 weeks  End Date:  November 1st, 2022  [facility] Care Per Protocol:  Labs BI- weekly while on IV antibiotics:  _x_ BMP w GFR _x_ Vancomycin trough  Labs  weekly while on IV antibiotics: _x_ CBC with differential _x_ BMP w GFR/CMP _x CRP __x ESR    __ Please pull PIC at completion of IV antibiotics _x_ Please leave PIC in place until doctor has seen patient or been notified  Fax weekly labs to [phone]  Clinic Follow Up Appt:   Katelyn Jackson has an appointment on 08/19/2021 at 945 AM with Dr. Tommy Medal  [facility] for Infectious Disease is located in the [facility] at  [place] in [place].  [address], which is located to the left of the elevators.  Phone: [phone]  Fax: [phone]  [email or web address]  She should arrive 15 to 30 minutes prior to her appointment.    LOS: 26 days   Alcide Evener 07/28/2021, 12:33 PM

## 2021-07-28 NOTE — Progress Notes (Signed)
   ORTHOPAEDIC PROGRESS NOTE  s/p Procedure(s): RIGHT SHOULDER HARDWARE REMOVAL WITH WASHOUT  SUBJECTIVE: Reports mild pain about operative site.   OBJECTIVE: PE: General: laying in hospital bed, NAD RUE: prevena wound vac in place with good seal, about 25 cc of bloody drainage noted in canister, hemovac also in place with about 50 cc of bloody drainge in canister, ROM not tested in early post-op setting, she endorses distal sensation, warm well perfused hand  Vitals:   07/28/21 1110 07/28/21 1618  BP: (!) 107/57 124/68  Pulse: 62   Resp: 16   Temp: 98.4 F (36.9 C)   SpO2: 99%      ASSESSMENT: Katelyn Jackson is a 75 y.o. female doing well postoperatively. POD#2  PLAN: Weightbearing: RUE okay to use right arm for transfers and ADLs at waist level Insicional and dressing care: Leave hemovac and prevena in place. Plan to remove tomorrow.  Orthopedic device(s): Hemovac and wound vac. Sling Showering: Hold for now VTE prophylaxis: Okay to resume Eliquis once hemoglobin stable Pain control: PRN pain medications, preferring oral medications Follow - up plan: 1 week in office Dispo: SNF Contact information:  Dr. Ophelia Charter, [name] PA-C , After hours and holidays please check [email or web address] for group call information for [facility]  [name], PA-C 07/28/2021

## 2021-07-28 NOTE — Progress Notes (Signed)
ANTICOAGULATION CONSULT NOTE - Initial Consult  Pharmacy Consult for apixaban (Eliquis) Indication: atrial fibrillation  Allergies  Allergen Reactions   Chlorhexidine Gluconate Hives   Penicillins Itching    Has patient had a PCN reaction causing immediate rash, facial/tongue/throat swelling, SOB or lightheadedness with hypotension: no Has patient had a PCN reaction causing severe rash involving mucus membranes or skin necrosis: No Has patient had a PCN reaction that required hospitalization: No Has patient had a PCN reaction occurring within the last 10 years: No If all of the above answers are "NO", then may proceed with Cephalosporin use. Tolerated Cephalosporin Date: 03/17/21. Td Ancef 2g 07/26/21    Patient Measurements: Height: 5\' 1"  (154.9 cm) Weight: 108.1 kg (238 lb 5.1 oz) IBW/kg (Calculated) : 47.8  Vital Signs: Temp: 98.4 F (36.9 C) (09/21 1110) Temp Source: Oral (09/21 0457) BP: 107/57 (09/21 1110) Pulse Rate: 62 (09/21 1110)  Labs: Recent Labs    07/26/21 0421 07/27/21 0449 07/28/21 0413  HGB 10.1* 9.6* 9.2*  HCT 32.9* 31.5* 30.4*  PLT 201 194 167  LABPROT 17.3* 17.4* 19.4*  INR 1.4* 1.4* 1.6*  CREATININE 1.43* 1.25* 1.23*    Estimated Creatinine Clearance: 44.9 mL/min (A) (by C-G formula based on SCr of 1.23 mg/dL (H)).   Medical History: Past Medical History:  Diagnosis Date   Arthritis    Cancer [facility])    breast cancer   Chronic atrial fibrillation (HCC)    Chronic kidney disease    sees Dr Florene Glen   Cramps, muscle, general    Dyspnea on exertion    Dysrhythmia    a-fib,    GERD (gastroesophageal reflux disease)    Headache    Hyperlipidemia    Hypertension    Hypothyroidism    Lymphedema    Moderate to severe pulmonary hypertension (HCC)    Obesity    Personal history of radiation therapy    Pneumonia 12/2020   Presence of permanent cardiac pacemaker 01/21/2021   for bradycardia    Syncope 12/2020   needed a pacemaker     Medications:  Medications Prior to Admission  Medication Sig Dispense Refill Last Dose   allopurinol (ZYLOPRIM) 100 MG tablet Take 0.5 tablets (50 mg total) by mouth daily. (Patient taking differently: Take 100 mg by mouth daily.) 45 tablet 3 06/30/2021   carvedilol (COREG) 25 MG tablet Take 1 tablet (25 mg total) by mouth 2 (two) times daily. 180 tablet 3 06/30/2021 at 2200   Cholecalciferol (VITAMIN D3) 2000 units capsule Take 1 capsule (2,000 Units total) by mouth daily. 100 capsule 3 06/30/2021   Cyanocobalamin (VITAMIN B-12 PO) Take 2,000 mcg by mouth daily.   06/30/2021   diclofenac Sodium (VOLTAREN) 1 % GEL APPLY FOUR GRAMS FOUR TIMES DAILY AS NEEDED (Patient taking differently: Apply 4 g topically 4 (four) times daily as needed (pain).) 100 g 5 unk   exemestane (AROMASIN) 25 MG tablet Take 1 tablet (25 mg total) by mouth daily after breakfast. 90 tablet 3 06/30/2021   levothyroxine (SYNTHROID) 50 MCG tablet Take 1 tablet (50 mcg total) by mouth daily before breakfast. Overdue for Annual appt must see provider for future refills 90 tablet 3 06/30/2021   lipase/protease/amylase (CREON) 12000-38000 units CPEP capsule Take 1 capsule (12,000 Units total) by mouth daily as needed (stomach problems). 270 capsule 3 unk   lovastatin (MEVACOR) 20 MG tablet Take 1 tablet (20 mg total) by mouth daily. 90 tablet 3 06/30/2021   magnesium oxide (MAG-OX) 400 MG tablet  Take 400 mg by mouth 2 (two) times daily.   06/30/2021   methocarbamol (ROBAXIN) 500 MG tablet Take 1 tablet (500 mg total) by mouth every 8 (eight) hours as needed for muscle spasms. 20 tablet 0 unk   omeprazole (PRILOSEC) 40 MG capsule Take 1 capsule (40 mg total) by mouth daily. Overdue for Annual appt must see provider for future refills (Patient taking differently: Take 40 mg by mouth daily as needed (acid reflux). Overdue for Annual appt must see provider for future refills) 90 capsule 3 Past Week   OVER THE COUNTER MEDICATION Apply 1  application topically daily as needed (apply to buttocks  as needed for irritaiton). Intensive Skin Care Therapy   unk   polyethylene glycol powder (GLYCOLAX/MIRALAX) powder mix 1 capful (17 grams) IN 8 ounces OF liquid EVERY DAY (Patient taking differently: Take 17 g by mouth daily as needed for mild constipation.) 1581 g 3 06/30/2021   potassium chloride (KLOR-CON) 8 MEQ tablet Take 1 tablet (8 mEq total) by mouth daily. 90 tablet 3 06/30/2021   torsemide (DEMADEX) 20 MG tablet Take 60 mg every morning and 40 mg in the pm (2 pm) 450 tablet 3 06/28/2021   traMADol (ULTRAM) 50 MG tablet Take 1 tablet (50 mg total) by mouth every 8 (eight) hours as needed for severe pain. 30 tablet 0 06/30/2021   warfarin (COUMADIN) 5 MG tablet Take 1 tablet daily except take 1/2 tablet on Wed and Sat or Take as directed by anticoagulation clinic (Patient taking differently: Take 2.5-5 mg by mouth See admin instructions. 2.5mg  every day except for Tuesday's. Tuesday's take 5mg . Total weekly dose of 20mg .) 90 tablet 1 06/30/2021 at 2200   tamoxifen (NOLVADEX) 20 MG tablet Take 1 tablet (20 mg total) by mouth daily. (Patient taking differently: Take 20 mg by mouth daily. Will take after finishing Exemestane) 30 tablet 5     Assessment: Pharmacy consulted to dose apixaban for this 75 yo female with PMH of permanent Afib (was on warfarin), HTN, HLD, pacer in place, hx breast cancer, hypothyroidism, chronic bilateral lower extremity lymphedema, morbid obesity, recurrent falls, GERD, pulmonary hypertension and newly diagnosed with decompensated hepatic cirrhosis which prompted switch from warfarin to apixaban.  Weight is greater than 60 kg and serum creatinine is less than 1.5.  Goal of Therapy:  Prevent stroke and systemic embolism Monitor platelets by anticoagulation protocol: Yes   Plan:  Apixaban 5 mg po BID Pharmacy will sign off for apixaban consult, but will follow pt peripherally along with you monitoring CBC and s/s  bleeding.   Royetta Asal, PharmD, BCPS Clinical Pharmacist [place] Please utilize Amion for appropriate phone number to reach the unit pharmacist ([place]) 07/28/2021 1:41 PM

## 2021-07-28 NOTE — Op Note (Signed)
Orthopaedic Surgery Operative Note (CSN: [id number])  Katelyn Jackson  [birth year] Date of Surgery: 07/26/2021   Diagnoses:  Shoulder infection  Procedure: Incision and debridement of deep abscess Resection arthroplasty of shoulder   Operative Finding Successful completion of planned procedure.  Patient had a superficial abscess.  We dissected down to the joint and found some unhealthy appearing tissue within the joint.  This was communicating with the superficial abscess.  There was no deep purulence in the joint however there was some purulent material underneath the humeral head.  Based on the patient's severe obesity as well as her multiple comorbidities we did not feel that it was appropriate to perform a spacer as this would likely be a nidus for another infection.  Her overall function in the setting of her unreconstructable glenoid was poor.  We felt that a resection arthroplasty was better for the patient.  Unfortunately this will leave her with very limited function.  Of note x-rays will appear as if the resection arthroplasty is dislocated anteriorly and this is the standard and what we expect.  She has no glenoid on which to keep an implant stable.  Post-operative plan: The patient will be NWB in sling.  The patient will be  readmitted to the medicine service   DVT prophylaxis not indicated as patient already on full dose anticoagulant.  Pain control with PRN pain medication preferring oral medicines.  Follow up plan to be determined  Implants: Explantation of Tornier hemiarthroplasty  Post-Op Diagnosis: Same Surgeons:Primary: Hiram Gash, MD Assistants:Caroline McBane PA-C Location: [facility] Anesthesia: General with Exparel Interscalene Antibiotics: Ancef 2g preop, Vancomycin 1000mg  locally, 1.2 g of tobramycin Tourniquet time: None Estimated Blood Loss: 315 Complications: None Specimens: None Implants: * No implants in log *  Indications for Surgery:   Katelyn Jackson is a 75 y.o. female with previous hemiarthroplasty in setting of unreconstructable joint and the suspicion of infection.  Patient had lower extremity infection and worsening shoulder pain afterwards even after period of doing well.  Benefits and risks of operative and nonoperative management were discussed prior to surgery with patient/guardian(s) and informed consent form was completed.  Infection and need for further surgery were discussed as was prosthetic stability and cuff issues.  We additionally specifically discussed risks of axillary nerve injury, infection, periprosthetic fracture, continued pain and longevity of implants prior to beginning procedure.      Procedure:   The patient was identified in the preoperative holding area where the surgical site was marked. Block placed by anesthesia with exparel.  The patient was taken to the OR where a procedural timeout was called and the above noted anesthesia was induced.  The patient was positioned beachchair on allen table with spider arm positioner.  Preoperative antibiotics were dosed.  The patient's right shoulder was prepped and draped in the usual sterile fashion.  A second preoperative timeout was called.       We opened our previous deltopectoral incision and excised a skin bulla at the incision site.  We opened the skin distally and noted purulent material.  We debrided muscle, skin, fascia.  We probed and were able to find an area that tracked down to the subscapularis repair.  There is no obvious tracking into the joint.  We then peeled away the subscapularis removed our previously placed FiberWire sutures.  At that point we were able to dislocate the joint and remove the humeral head.  There was some purulent material under the humeral head.  We sent multiple cultures.  Broach handle was used and were able to excise the stem without issue.  We used a curette to debride the interior of the humeral canal.  At that point we used a  rondure and curette to debride bone, deep tissue, muscle that appeared purulent.  We scraped the capsule to ensure that there is no residual infection.  We placed Irrisept solution.  After an appropriate amount of time this was washed away and we irrigated with 6 L normal saline.  Medium Hemovac drain was placed in the joint.  Incision was closed in a multilayer fashion using PDS sutures deep sparingly and closing the skin with nylon.  Prevena wound VAC was placed.  Debridement type: Excisional Debridement  Side: right  Body Location: Shoulder  Tools used for debridement: scalpel, scissors, curette, and rongeur  Pre-debridement Wound size (cm):   Length: 10        width: 4     depth: 10  Post-debridement Wound size (cm):   Length: 10        width: 4     depth: 10  Debridement depth beyond dead/damaged tissue down to healthy viable tissue: yes  Tissue layer involved: skin, subcutaneous tissue, muscle / fascia, bone  Nature of tissue removed: Slough, Necrotic, and Purulence  Irrigation volume: 6 L     Irrigation fluid type: Irricept and normal saline     [employer], PA-C, present and scrubbed throughout the case, critical for completion in a timely fashion, and for retraction, instrumentation, closure.

## 2021-07-28 NOTE — Progress Notes (Signed)
PROGRESS NOTE   Katelyn Jackson  PFX:[id number]    DOB: February 21, 1946    DOA: 07/01/2021  PCP: Cassandria Anger, MD   I have briefly reviewed patients previous medical records in [facility]  Chief Complaint  Patient presents with   Dysuria    Brief Narrative:  [name] is a 75 yo female with PMH permanent Afib (on Coumadin), HTN, HLD, pacer in place, hx breast cancer, hypothyroidism, chronic bilateral lower extremity lymphedema, morbid obesity, recurrent falls, GERD, pulmonary hypertension who presented to the hospital with worsening weakness and falls at home.  She had endorsed pain in her left ankle, knee, and shoulder. She was initially treated for suspected cellulitis in her left leg due to some increased erythema noted on admission. She was also noted to have a wound over her right shoulder and underwent further work-up for this as well.  She is now s/p I&D with resection arthroplasty of right shoulder with placement of wound VAC on 9/19 and remains on IV antibiotics for same.   See below for further A&P.   Assessment & Plan:  Principal Problem:   Abscess of right shoulder Active Problems:   Essential hypertension   Chronic atrial fibrillation (HCC)   Lymphedema   Malignant neoplasm of upper-outer quadrant of left breast in female, estrogen receptor positive (HCC)   Anemia of chronic disease   Acute renal failure superimposed on stage 3b chronic kidney disease (HCC)   Morbid obesity (HCC)   GERD (gastroesophageal reflux disease)   Bilateral pain of leg and foot   Hypothyroidism   Pressure injury of skin   History of pacemaker   Decompensated hepatic cirrhosis ([place])   Abscess   Prosthetic joint infection (HCC)   Abscess of right shoulder - s/p right shoulder hemiarthroplasty in May 2022 - s/p aspiration of shoulder on 07/16/21 but appears fluid too thick for likely a good sample and pus was noted during procedure -S/p I&D with resection arthroplasty and  cultures taken in the operating room on 9/19.  Has wound VAC in place. - ID has started her on IV vancomycin, added ceftriaxone today and ordered a PICC line. - It appears from orthopedic follow-up that patient can be discharged to SNF from their standpoint.  Await ID final recommendations regarding DC antibiotics.   Decompensated hepatic cirrhosis ([place]) - despite new diagnosis, she's had this a while; RUQ u/s on 9/14 shows nodular liver consistent with diagnosis; also some GB sludge but LFT pattern not consistent with obstruction  - she has had elevated INR despite coumadin washout with ongoing elevated LFTs which may have some confounding from underlying infection; ammonia also upper limit normal on 9/9 and she's responded well to lactulose (rifaximin also started per GI) - GI consulted for further assistance with evaluation - likely NAFLD; no history of etoh use that would explain - further autoimmune workup also commenced.  Only significant for elevated alpha-1 antitrypsin levels.  ANA, antimitochondrial antibody, c-ANCA, p-ANCA and atypical p-ANCA titer: Negative.  HBsAg and HCV antibody negative. - renal function improved with IVF (was overdiuresis with torsemide)   Chronic atrial fibrillation (HCC) - on Coumadin at home prior to admission -Coumadin was held in preparation for surgery. - see INR workup as well  - given new diagnosis of cirrhosis, will transition her to Eliquis once resuming anticoagulation - Hemoglobin has been stable postop.  We will start Eliquis per pharmacy.   Elevated INR-resolved as of 07/25/2021 - INR peaked at 9.9 after admission on  8/26 and she was given Vit K to help reverse  - last Coumadin dose 9/9  - see cirrhosis now as well -INR 1.4 on 9/20.  1.6 today and seems to be somewhat labile.   Acute renal failure superimposed on stage 3b chronic kidney disease ([place]) - patient has history of CKD3b. Baseline creat ~ 1.4 - 1.5, eGFR 31 - 41 - has been on  torsemide 40 mg BID likely contributing to overdiuresis; not consistent with a hepatorenal syndrome at this time - torsemide stopped on 9/15 - s/p IVF resuscitation with good improvement in renal function - continue holding diuretic and will resume as needed - Acute renal failure seems to have resolved.  Creatinine down to 1.25 >1.23.   Acute metabolic encephalopathy-resolved as of 07/23/2021 - Multifactorial and likely some hospital delirium contributing given extended length of stay - patient symptoms include occasional AMS - etiology considered due to toxic/metabolic - continue re-orienting as needed - treating infection - given recently elevated NH3 and now diagnosis consistent with cirrhosis, she is also started on lactulose and rifaximin    Cellulitis of left lower extremity-resolved as of 07/21/2021 - mild erythema on admission from media pic; now treated and resolved    History of pacemaker - stable; at risk for infection seeding without source control  - implanted at [facility] on 01/06/21: [employer] pacemaker with model number of ASSURITY MRI PM 2272 and serial number of [id number]   Hypothyroidism - Continue Synthroid   GERD (gastroesophageal reflux disease) - Continue Protonix   Morbid obesity ([place]) - Complicates overall prognosis and care - Body mass index is 43.36 kg/m. - Weight Loss and Dietary Counseling given   Anemia of chronic disease - Baseline hemoglobin 9 to 10 g/dL -Currently at baseline and no signs of bleeding   Malignant neoplasm of upper-outer quadrant of left breast in female, estrogen receptor positive ([place]) - outpatient follow up as indicated    Lymphedema - Stable edema.  No further signs of cellulitis involving lower extremities   Essential hypertension - Continue Coreg   Acute respiratory failure with hypoxia (HCC)-resolved as of 07/21/2021 secondary to fluid overload,  acute on chronic systolic CHF, hypercapnia likely from OSA. Respiratory  status much improved.  Patient had adequate diuresis with negative fluid balance. Continue supplemental oxygen to keep saturation above 94%. Diuretics changed to torsemide  - now on RA    DVT prophylaxis: SCD's Start: 07/26/21 1538 SCDs Start: 07/02/21 0948     Code Status: Full Code Family Communication: Discussed with patient's son at bedside on 9/20.  None at bedside today. Disposition:  Status is: Inpatient  Remains inpatient appropriate because:Inpatient level of care appropriate due to severity of illness  Dispo: The patient is from: Home              Anticipated d/c is to: SNF hopefully in 1 to 2 days.              Patient currently is not medically stable to d/c.   Difficult to place patient No        Consultants:   Infectious disease Orthopedics Gastroenterology  Procedures:   As noted above.  Antimicrobials:    Anti-infectives (From admission, onward)    Start     Dose/Rate Route Frequency Ordered Stop   07/28/21 1600  vancomycin (VANCOREADY) IVPB 750 mg/150 mL        750 mg 150 mL/hr over 60 Minutes Intravenous Every 24 hours 07/27/21 1405  07/28/21 1300  cefTRIAXone (ROCEPHIN) 2 g in sodium chloride 0.9 % 100 mL IVPB        2 g 200 mL/hr over 30 Minutes Intravenous Every 24 hours 07/28/21 1212     07/27/21 1500  vancomycin (VANCOREADY) IVPB 2000 mg/400 mL        2,000 mg 200 mL/hr over 120 Minutes Intravenous  Once 07/27/21 1404 07/27/21 1737   07/26/21 1830  ceFAZolin (ANCEF) IVPB 1 g/50 mL premix        1 g 100 mL/hr over 30 Minutes Intravenous Every 6 hours 07/26/21 1537 07/27/21 0610   07/26/21 1325  vancomycin (VANCOCIN) powder  Status:  Discontinued          As needed 07/26/21 1338 07/26/21 1514   07/26/21 1325  tobramycin (NEBCIN) powder  Status:  Discontinued          As needed 07/26/21 1338 07/26/21 1514   07/26/21 1100  ceFAZolin (ANCEF) IVPB 2g/100 mL premix        2 g 200 mL/hr over 30 Minutes Intravenous On call to O.R. 07/26/21  1055 07/26/21 1316   07/26/21 1056  ceFAZolin (ANCEF) 2-4 GM/100ML-% IVPB       Note to Pharmacy: Lavon Paganini   : cabinet override      07/26/21 1056 07/26/21 1309   07/22/21 1100  rifaximin (XIFAXAN) tablet 550 mg        550 mg Oral 2 times daily 07/22/21 1005     07/16/21 1200  vancomycin (VANCOREADY) IVPB 750 mg/150 mL  Status:  Discontinued        750 mg 150 mL/hr over 60 Minutes Intravenous Every 24 hours 07/15/21 1242 07/19/21 1537   07/15/21 1400  vancomycin (VANCOREADY) IVPB 2000 mg/400 mL        2,000 mg 200 mL/hr over 120 Minutes Intravenous  Once 07/15/21 1242 07/15/21 1522   07/10/21 1530  doxycycline (VIBRA-TABS) tablet 100 mg  Status:  Discontinued        100 mg Oral Every 12 hours 07/10/21 1437 07/15/21 1127   07/05/21 1245  cefadroxil (DURICEF) capsule 500 mg        500 mg Oral 2 times daily 07/05/21 1155 07/07/21 2217   07/03/21 0900  cefTRIAXone (ROCEPHIN) 2 g in sodium chloride 0.9 % 100 mL IVPB  Status:  Discontinued        2 g 200 mL/hr over 30 Minutes Intravenous Every 24 hours 07/02/21 0950 07/05/21 1155   07/02/21 0915  cefTRIAXone (ROCEPHIN) 2 g in sodium chloride 0.9 % 100 mL IVPB        2 g 200 mL/hr over 30 Minutes Intravenous  Once 07/02/21 0909 07/02/21 1033         Subjective:  Interviewed and examined patient using video interpreter.  Reports 3/10 right shoulder pain and 4/10 right upper leg pain (chronic), both improved after pain meds.  Denied any other complaints.  Objective:   Vitals:   07/28/21 0457 07/28/21 0907 07/28/21 0909 07/28/21 1110  BP: 111/65 127/64 127/64 (!) 107/57  Pulse: 60 (!) 58 (!) 58 62  Resp: 18   16  Temp: 98 F (36.7 C)   98.4 F (36.9 C)  TempSrc: Oral     SpO2: 98%   99%  Weight:      Height:        General exam: Elderly female, moderately built and morbidly obese lying comfortably propped up in bed without distress. Respiratory system: Clear to auscultation.  No increased work of breathing. Cardiovascular  system: S1 and S2 heard, irregularly irregular.  No JVD.  Trace bilateral ankle edema.  Telemetry personally reviewed: A. fib with V paced rhythm. Gastrointestinal system: Abdomen is nondistended, soft and nontender. No organomegaly or masses felt. Normal bowel sounds heard. Central nervous system: Alert and oriented. No focal neurological deficits. Extremities: Symmetric 5 x 5 power.  Right upper extremity in sling.  Right shoulder with wound VAC in place.  Drain output 80 cc yesterday and only 5 cc today per RN.  Bloody. Skin: No rashes, lesions or ulcers Psychiatry: Judgement and insight appear normal. Mood & affect appropriate.     Data Reviewed:   I have personally reviewed following labs and imaging studies   CBC: Recent Labs  Lab 07/26/21 0421 07/27/21 0449 07/28/21 0413  WBC 4.9 5.2 7.0  NEUTROABS 2.6 4.0 5.2  HGB 10.1* 9.6* 9.2*  HCT 32.9* 31.5* 30.4*  MCV 97.1 95.7 97.4  PLT 201 194 275    Basic Metabolic Panel: Recent Labs  Lab 07/26/21 0421 07/27/21 0449 07/28/21 0413  NA 141 135 135  K 3.7 4.8 4.1  CL 100 95* 99  CO2 34* 29 29  GLUCOSE 88 138* 111*  BUN _0 CREATININE 1.43* 1.25* 1.23*  CALCIUM 10.9* 9.9 10.2  MG 2.4 2.3 2.4    Liver Function Tests: Recent Labs  Lab 07/25/21 0525 07/26/21 0421 07/27/21 0449  AST _1 ALT _2 ALKPHOS 273* 245* 218*  BILITOT 2.0* 2.2* 1.7*  PROT 6.5 6.3* 6.0*  ALBUMIN 2.5* 2.3* 2.3*    CBG: No results for input(s): GLUCAP in the last 168 hours.  Microbiology Studies:   Recent Results (from the past 240 hour(s))  Surgical PCR screen     Status: None   Collection Time: 07/25/21  5:49 PM   Specimen: Nasal Mucosa; Nasal Swab  Result Value Ref Range Status   MRSA, PCR NEGATIVE NEGATIVE Final   Staphylococcus aureus NEGATIVE NEGATIVE Final    Comment: (NOTE) The Xpert SA Assay (FDA approved for NASAL specimens in patients 46 years of age and older), is one component of a  comprehensive surveillance program. It is not intended to diagnose infection nor to guide or monitor treatment. Performed at [facility], [place] [address], [place], [place] [address]   Aerobic/Anaerobic Culture w Gram Stain (surgical/deep wound)     Status: None (Preliminary result)   Collection Time: 07/26/21  2:37 PM   Specimen: Soft Tissue, Other  Result Value Ref Range Status   Specimen Description   Final    TISSUE SHOULDER RIGHT 1 Performed at [facility] [address], [place], [place] [address]    Special Requests   Final    NONE Performed at [facility], [place] [address], [place], [place] [address]    Gram Stain   Final    FEW WBC PRESENT, PREDOMINANTLY PMN NO ORGANISMS SEEN    Culture   Final    NO GROWTH 2 DAYS NO ANAEROBES ISOLATED; CULTURE IN PROGRESS FOR 5 DAYS Performed at [facility] [address], [place],  [address]    Report Status PENDING  Incomplete  Aerobic/Anaerobic Culture w Gram Stain (surgical/deep wound)     Status: None (Preliminary result)   Collection Time: 07/26/21  2:38 PM   Specimen: Soft Tissue, Other  Result Value Ref Range Status   Specimen Description   Final  TISSUE SHOULDER RIGHT 2 Performed at [facility] [address] [place], [place] [address]    Special Requests   Final    NONE Performed at [facility], [place] [address] [place], [place] [address]    Gram Stain   Final    FEW WBC PRESENT, PREDOMINANTLY PMN NO ORGANISMS SEEN    Culture   Final    NO GROWTH 2 DAYS NO ANAEROBES ISOLATED; CULTURE IN PROGRESS FOR 5 DAYS Performed at [facility] [address] [place], [place] [address]    Report Status PENDING  Incomplete     Radiology Studies:  DG Shoulder Right Port  Result Date: 07/26/2021 CLINICAL DATA:  Postop right shoulder. EXAM: PORTABLE RIGHT SHOULDER COMPARISON:  Shoulder CT 07/19/2021  FINDINGS: Previous right shoulder arthroplasty has been removed. Surgical drain is in place. Chronic changes about the acromioclavicular joint. Previously demonstrated fracture through the base of the acromion is grossly similar common but not well assessed on this portable view. IMPRESSION: 1. Explantation of right shoulder arthroplasty. Surgical drain in place. 2. Previously demonstrated fracture through the base of the acromion was better assessed on prior CT. Electronically Signed   By: Keith Rake M.D.   On: 07/26/2021 16:58   [place] EKG SITE RITE  Result Date: 07/28/2021 If Site Rite image not attached, placement could not be confirmed due to current cardiac rhythm.    Scheduled Meds:    carvedilol  6.25 mg Oral BID WC   cholecalciferol  2,000 Units Oral Daily   docusate sodium  100 mg Oral BID   exemestane  25 mg Oral QPC breakfast   lactulose  10 g Oral TID   levothyroxine  50 mcg Oral QAC breakfast   mupirocin ointment  1 application Nasal BID   pantoprazole  40 mg Oral Daily   potassium chloride  20 mEq Oral Daily   pravastatin  20 mg Oral q1800   rifaximin  550 mg Oral BID   vitamin B-12  2,000 mcg Oral Daily    Continuous Infusions:    sodium chloride 250 mL (07/16/21 1319)   cefTRIAXone (ROCEPHIN)  IV     vancomycin       LOS: 26 days     Vernell Leep, MD, [employer], [facility]. [facility]    To contact the attending provider between 7A-7P or the covering provider during after hours 7P-7A, please log into the web site [email or web address] and access using universal [place] password for that web site. If you do not have the password, please call the hospital operator.  07/28/2021, 12:15 PM

## 2021-07-28 NOTE — Discharge Instructions (Addendum)
Information on my medicine - ELIQUIS (apixaban)   Why was Eliquis prescribed for you? Eliquis was prescribed for you to reduce the risk of a blood clot forming that can cause a stroke if you have a medical condition called atrial fibrillation (a type of irregular heartbeat).  What do You need to know about Eliquis ? Take your Eliquis TWICE DAILY - one tablet in the morning and one tablet in the evening with or without food. If you have difficulty swallowing the tablet whole please discuss with your pharmacist how to take the medication safely.  Take Eliquis exactly as prescribed by your doctor and DO NOT stop taking Eliquis without talking to the doctor who prescribed the medication.  Stopping may increase your risk of developing a stroke.  Refill your prescription before you run out.  After discharge, you should have regular check-up appointments with your healthcare provider that is prescribing your Eliquis.  In the future your dose may need to be changed if your kidney function or weight changes by a significant amount or as you get older.  What do you do if you miss a dose? If you miss a dose, take it as soon as you remember on the same day and resume taking twice daily.  Do not take more than one dose of ELIQUIS at the same time to make up a missed dose.  Important Safety Information A possible side effect of Eliquis is bleeding. You should call your healthcare provider right away if you experience any of the following: Bleeding from an injury or your nose that does not stop. Unusual colored urine (red or dark brown) or unusual colored stools (red or black). Unusual bruising for unknown reasons. A serious fall or if you hit your head (even if there is no bleeding).  Some medicines may interact with Eliquis and might increase your risk of bleeding or clotting while on Eliquis. To help avoid this, consult your healthcare provider or pharmacist prior to using any new prescription  or non-prescription medications, including herbals, vitamins, non-steroidal anti-inflammatory drugs (NSAIDs) and supplements.  This website has more information on Eliquis (apixaban): [email or web address]      Ophelia Charter MD, MPH [name], PA-C [facility] [address] [phone] (tel)   [phone] (fax)   POST-OPERATIVE INSTRUCTIONS - Shoulder   WOUND CARE You may leave the operative dressing in place until your follow-up appointment. Change the gauze and tegaderm dressing every 3 days, reinforce it sooner if there are signs of drainage KEEP THE INCISIONS CLEAN AND DRY. There may be a small amount of fluid/bleeding leaking at the surgical site. This is normal after surgery.  If it fills with liquid or blood please call us immediately to change it for you. Use the provided ice machine or Ice packs as often as possible for the first 3-4 days, then as needed for pain relief.   Keep a layer of cloth or a shirt between your skin and the cooling unit to prevent frost bite as it can get very cold.  SHOWERING: - You may shower on Post-Op Day #2.  - The dressing is water resistant but do not scrub it as it may start to peel up.   - You may remove the sling for showering, but keep a water resistant pillow under the arm to keep both the  elbow and shoulder away from the body (mimicking the abduction sling).  - Gently pat the area dry.  - Do not soak  the shoulder in water. Do not go swimming in the pool or ocean until your sutures are removed. - KEEP THE INCISIONS CLEAN AND DRY.  EXERCISES Wear the sling at all times   You may remove the sling for showering, but keep the arm across the chest or in a secondary sling.    Accidental/Purposeful External Rotation and shoulder flexion (reaching behind you) is to be avoided at all costs for the first month. It is ok to come out of your sling if your are sitting and have assistance for  eating.   Do not lift anything heavier than 1 pound until we discuss it further in clinic. It is okay to use your right arm to help you transfer with a walker It is okay to use your right arm for activities of daily living (eating, brushing teeth, etc) at your waist level  REGIONAL ANESTHESIA (NERVE BLOCKS) The anesthesia team may have performed a nerve block for you if safe in the setting of your care.  This is a great tool used to minimize pain.  Typically the block may start wearing off overnight but the long acting medicine may last for 3-4 days.  The nerve block wearing off can be a challenging period but please utilize your as needed pain medications to try and manage this period.    POST-OP MEDICATIONS- Multimodal approach to pain control In general your pain will be controlled with a combination of substances.  Prescriptions unless otherwise discussed are electronically sent to your pharmacy.  This is a carefully made plan we use to minimize narcotic use.     Acetaminophen - Non-narcotic pain medicine taken on a scheduled basis  Oxycodone - This is a strong narcotic, to be used only on an "as needed" basis for pain.   FOLLOW-UP If you develop a Fever (>101.5), Redness or Drainage from the surgical incision site, please call our office to arrange for an evaluation. Please call the office to schedule a follow-up appointment for a wound check, 7-10 days post-operatively.  IF YOU HAVE ANY QUESTIONS, PLEASE FEEL FREE TO CALL OUR OFFICE.  HELPFUL INFORMATION  If you had a block, it will wear off between 8-24 hrs postop typically.  This is period when your pain may go from nearly zero to the pain you would have had post-op without the block.  This is an abrupt transition but nothing dangerous is happening.  You may take an extra dose of narcotic when this happens.  Your arm will be in a sling following surgery. You will be in this sling for the next 4 weeks.  I will let you know the exact  duration at your follow-up visit.  You may be more comfortable sleeping in a semi-seated position the first few nights following surgery.  Keep a pillow propped under the elbow and forearm for comfort.  If you have a recliner type of chair it might be beneficial.  If not that is fine too, but it would be helpful to sleep propped up with pillows behind your operated shoulder as well under your elbow and forearm.  This will reduce pulling on the suture lines.  When dressing, put your operative arm in the sleeve first.  When getting undressed, take your operative arm out last.  Loose fitting, button-down shirts are recommended.  In most states it is against the law to drive while your arm is in a sling. And certainly against the law to drive while taking narcotics.  You may return  to work/school in the next couple of days when you feel up to it. Desk work and typing in the sling is fine.  We suggest you use the pain medication the first night prior to going to bed, in order to ease any pain when the anesthesia wears off. You should avoid taking pain medications on an empty stomach as it will make you nauseous.  Do not drink alcoholic beverages or take illicit drugs when taking pain medications.  Pain medication may make you constipated.  Below are a few solutions to try in this order: Decrease the amount of pain medication if you aren't having pain. Drink lots of decaffeinated fluids. Drink prune juice and/or each dried prunes  If the first 3 don't work start with additional solutions Take Colace - an over-the-counter stool softener Take Senokot - an over-the-counter laxative Take Miralax - a stronger over-the-counter laxative   Dental Antibiotics:  In most cases prophylactic antibiotics for Dental procdeures after total joint surgery are not necessary.  Exceptions are as follows:  1. History of prior total joint infection  2. Severely immunocompromised (Organ Transplant, cancer  chemotherapy, Rheumatoid biologic meds such as Galt)  3. Poorly controlled diabetes (A1C &gt; 8.0, blood glucose over 200)  If you have one of these conditions, contact your surgeon for an antibiotic prescription, prior to your dental procedure.   For more information including helpful videos and documents visit our website:   [email or web address]   Additional discharge instructions  Please get your medications reviewed and adjusted by your Primary MD.  Please request your Primary MD to go over all Hospital Tests and Procedure/Radiological results at the follow up, please get all Hospital records sent to your Prim MD by signing hospital release before you go home.  If you had Pneumonia of Lung problems at the Hospital: Please get a 2 view Chest X ray done in 6-8 weeks after hospital discharge or sooner if instructed by your Primary MD.  If you have Congestive Heart Failure: Please call your Cardiologist or Primary MD anytime you have any of the following symptoms:  1) 3 pound weight gain in 24 hours or 5 pounds in 1 week  2) shortness of breath, with or without a dry hacking cough  3) swelling in the hands, feet or stomach  4) if you have to sleep on extra pillows at night in order to breathe  Follow cardiac low salt diet and 1.5 lit/day fluid restriction.  If you have diabetes Accuchecks 4 times/day, Once in AM empty stomach and then before each meal. Log in all results and show them to your primary doctor at your next visit. If any glucose reading is under 80 or above 300 call your primary MD immediately.  If you have Seizure/Convulsions/Epilepsy: Please do not drive, operate heavy machinery, participate in activities at heights or participate in high speed sports until you have seen by Primary MD or a Neurologist and advised to do so again.  If you had Gastrointestinal Bleeding: Please ask your Primary MD to check a complete blood count  within one week of discharge or at your next visit. Your endoscopic/colonoscopic biopsies that are pending at the time of discharge, will also need to followed by your Primary MD.  Get Medicines reviewed and adjusted. Please take all your medications with you for your next visit with your Primary MD  Please request your Primary MD to go over all hospital tests and procedure/radiological results at the follow up, please ask your Primary  MD to get all Hospital records sent to his/her office.  If you experience worsening of your admission symptoms, develop shortness of breath, life threatening emergency, suicidal or homicidal thoughts you must seek medical attention immediately by calling 911 or calling your MD immediately  if symptoms less severe.  You must read complete instructions/literature along with all the possible adverse reactions/side effects for all the Medicines you take and that have been prescribed to you. Take any new Medicines after you have completely understood and accpet all the possible adverse reactions/side effects.   Do not drive or operate heavy machinery when taking Pain medications.   Do not take more than prescribed Pain, Sleep and Anxiety Medications  Special Instructions: If you have smoked or chewed Tobacco  in the last 2 yrs please stop smoking, stop any regular Alcohol  and or any Recreational drug use.  Wear Seat belts while driving.  Please note You were cared for by a hospitalist during your hospital stay. If you have any questions about your discharge medications or the care you received while you were in the hospital after you are discharged, you can call the unit and asked to speak with the hospitalist on call if the hospitalist that took care of you is not available. Once you are discharged, your primary care physician will handle any further medical issues. Please note that NO REFILLS for any discharge medications will be authorized once you are discharged,  as it is imperative that you return to your primary care physician (or establish a relationship with a primary care physician if you do not have one) for your aftercare needs so that they can reassess your need for medications and monitor your lab values.  You can reach the hospitalist office at phone [phone] or fax [phone]   If you do not have a primary care physician, you can call [phone] for a physician referral.

## 2021-07-29 ENCOUNTER — Inpatient Hospital Stay (HOSPITAL_COMMUNITY): Payer: Medicare HMO

## 2021-07-29 ENCOUNTER — Encounter (HOSPITAL_COMMUNITY): Payer: Self-pay | Admitting: Interventional Radiology

## 2021-07-29 DIAGNOSIS — L03116 Cellulitis of left lower limb: Secondary | ICD-10-CM | POA: Diagnosis not present

## 2021-07-29 DIAGNOSIS — L02413 Cutaneous abscess of right upper limb: Secondary | ICD-10-CM | POA: Diagnosis not present

## 2021-07-29 DIAGNOSIS — N179 Acute kidney failure, unspecified: Secondary | ICD-10-CM | POA: Diagnosis not present

## 2021-07-29 DIAGNOSIS — G9341 Metabolic encephalopathy: Secondary | ICD-10-CM | POA: Diagnosis not present

## 2021-07-29 HISTORY — PX: IR FLUORO GUIDE CV LINE LEFT: IMG2282

## 2021-07-29 LAB — CBC WITH DIFFERENTIAL/PLATELET
Abs Immature Granulocytes: 0.04 10*3/uL (ref 0.00–0.07)
Basophils Absolute: 0.1 10*3/uL (ref 0.0–0.1)
Basophils Relative: 1 %
Eosinophils Absolute: 0.1 10*3/uL (ref 0.0–0.5)
Eosinophils Relative: 1 %
HCT: 28.4 % — ABNORMAL LOW (ref 36.0–46.0)
Hemoglobin: 8.7 g/dL — ABNORMAL LOW (ref 12.0–15.0)
Immature Granulocytes: 1 %
Lymphocytes Relative: 26 %
Lymphs Abs: 1.5 10*3/uL (ref 0.7–4.0)
MCH: 29.7 pg (ref 26.0–34.0)
MCHC: 30.6 g/dL (ref 30.0–36.0)
MCV: 96.9 fL (ref 80.0–100.0)
Monocytes Absolute: 0.6 10*3/uL (ref 0.1–1.0)
Monocytes Relative: 11 %
Neutro Abs: 3.4 10*3/uL (ref 1.7–7.7)
Neutrophils Relative %: 60 %
Platelets: 149 10*3/uL — ABNORMAL LOW (ref 150–400)
RBC: 2.93 MIL/uL — ABNORMAL LOW (ref 3.87–5.11)
RDW: 16.4 % — ABNORMAL HIGH (ref 11.5–15.5)
WBC: 5.7 10*3/uL (ref 4.0–10.5)
nRBC: 0 % (ref 0.0–0.2)

## 2021-07-29 LAB — CREATININE, SERUM
Creatinine, Ser: 1.31 mg/dL — ABNORMAL HIGH (ref 0.44–1.00)
GFR, Estimated: 42 mL/min — ABNORMAL LOW (ref >60)

## 2021-07-29 LAB — PROTIME-INR
INR: 2.3 — ABNORMAL HIGH (ref 0.8–1.2)
Prothrombin Time: 25.6 seconds — ABNORMAL HIGH (ref 11.4–15.2)

## 2021-07-29 LAB — VANCOMYCIN, TROUGH: Vancomycin Tr: 19 ug/mL (ref 15–20)

## 2021-07-29 LAB — RESP PANEL BY RT-PCR (FLU A&B, COVID) ARPGX2
Influenza A by PCR: NEGATIVE
Influenza B by PCR: NEGATIVE
SARS Coronavirus 2 by RT PCR: NEGATIVE

## 2021-07-29 MED ORDER — MIDAZOLAM HCL 2 MG/2ML IJ SOLN
INTRAMUSCULAR | Status: DC | PRN
  Administered 2021-07-29: 1 mg via INTRAVENOUS

## 2021-07-29 MED ORDER — SODIUM CHLORIDE 0.9% FLUSH: 10.0000 mL | INTRAVENOUS | Status: DC | PRN

## 2021-07-29 MED ORDER — MIDAZOLAM HCL 2 MG/2ML IJ SOLN
INTRAMUSCULAR | Status: AC
  Filled 2021-07-29: qty 2

## 2021-07-29 MED ORDER — LIDOCAINE HCL 1 % IJ SOLN
INTRAMUSCULAR | Status: AC
  Administered 2021-07-29: 3 mL via SUBCUTANEOUS
  Filled 2021-07-29: qty 20

## 2021-07-29 MED ORDER — VANCOMYCIN HCL 500 MG/100ML IV SOLN
500.0000 mg | INTRAVENOUS | Status: DC
  Filled 2021-07-29: qty 100

## 2021-07-29 MED ORDER — LIDOCAINE HCL (PF) 1 % IJ SOLN
INTRAMUSCULAR | Status: DC | PRN
  Administered 2021-07-29 (×2): 5 mL via INTRADERMAL

## 2021-07-29 MED ORDER — FENTANYL CITRATE (PF) 100 MCG/2ML IJ SOLN
INTRAMUSCULAR | Status: DC | PRN
  Administered 2021-07-29 (×2): 50 ug via INTRAVENOUS

## 2021-07-29 MED ORDER — FENTANYL CITRATE (PF) 100 MCG/2ML IJ SOLN
INTRAMUSCULAR | Status: AC
  Filled 2021-07-29: qty 2

## 2021-07-29 NOTE — Progress Notes (Addendum)
Subjective:  She says she continues to have improvement in her shoulder pain    Antibiotics:  Anti-infectives (From admission, onward)    Start     Dose/Rate Route Frequency Ordered Stop   07/28/21 1600  vancomycin (VANCOREADY) IVPB 750 mg/150 mL        750 mg 150 mL/hr over 60 Minutes Intravenous Every 24 hours 07/27/21 1405     07/28/21 1300  cefTRIAXone (ROCEPHIN) 2 g in sodium chloride 0.9 % 100 mL IVPB        2 g 200 mL/hr over 30 Minutes Intravenous Every 24 hours 07/28/21 1212     07/27/21 1500  vancomycin (VANCOREADY) IVPB 2000 mg/400 mL        2,000 mg 200 mL/hr over 120 Minutes Intravenous  Once 07/27/21 1404 07/27/21 1737   07/26/21 1830  ceFAZolin (ANCEF) IVPB 1 g/50 mL premix        1 g 100 mL/hr over 30 Minutes Intravenous Every 6 hours 07/26/21 1537 07/27/21 0610   07/26/21 1325  vancomycin (VANCOCIN) powder  Status:  Discontinued          As needed 07/26/21 1338 07/26/21 1514   07/26/21 1325  tobramycin (NEBCIN) powder  Status:  Discontinued          As needed 07/26/21 1338 07/26/21 1514   07/26/21 1100  ceFAZolin (ANCEF) IVPB 2g/100 mL premix        2 g 200 mL/hr over 30 Minutes Intravenous On call to O.R. 07/26/21 1055 07/26/21 1316   07/26/21 1056  ceFAZolin (ANCEF) 2-4 GM/100ML-% IVPB       Note to Pharmacy: Lavon Paganini   : cabinet override      07/26/21 1056 07/26/21 1309   07/22/21 1100  rifaximin (XIFAXAN) tablet 550 mg        550 mg Oral 2 times daily 07/22/21 1005     07/16/21 1200  vancomycin (VANCOREADY) IVPB 750 mg/150 mL  Status:  Discontinued        750 mg 150 mL/hr over 60 Minutes Intravenous Every 24 hours 07/15/21 1242 07/19/21 1537   07/15/21 1400  vancomycin (VANCOREADY) IVPB 2000 mg/400 mL        2,000 mg 200 mL/hr over 120 Minutes Intravenous  Once 07/15/21 1242 07/15/21 1522   07/10/21 1530  doxycycline (VIBRA-TABS) tablet 100 mg  Status:  Discontinued        100 mg Oral Every 12 hours 07/10/21 1437 07/15/21 1127    07/05/21 1245  cefadroxil (DURICEF) capsule 500 mg        500 mg Oral 2 times daily 07/05/21 1155 07/07/21 2217   07/03/21 0900  cefTRIAXone (ROCEPHIN) 2 g in sodium chloride 0.9 % 100 mL IVPB  Status:  Discontinued        2 g 200 mL/hr over 30 Minutes Intravenous Every 24 hours 07/02/21 0950 07/05/21 1155   07/02/21 0915  cefTRIAXone (ROCEPHIN) 2 g in sodium chloride 0.9 % 100 mL IVPB        2 g 200 mL/hr over 30 Minutes Intravenous  Once 07/02/21 0909 07/02/21 1033       Medications: Scheduled Meds:  apixaban  5 mg Oral BID   carvedilol  6.25 mg Oral BID WC   cholecalciferol  2,000 Units Oral Daily   docusate sodium  100 mg Oral BID   exemestane  25 mg Oral QPC breakfast   fentaNYL       lactulose  10 g Oral TID   levothyroxine  50 mcg Oral QAC breakfast   midazolam       mupirocin ointment  1 application Nasal BID   pantoprazole  40 mg Oral Daily   potassium chloride  20 mEq Oral Daily   pravastatin  20 mg Oral q1800   rifaximin  550 mg Oral BID   vitamin B-12  2,000 mcg Oral Daily   Continuous Infusions:  sodium chloride 250 mL (07/16/21 1319)   cefTRIAXone (ROCEPHIN)  IV 2 g (07/29/21 1408)   vancomycin 750 mg (07/28/21 1545)   PRN Meds:.sodium chloride, acetaminophen **OR** acetaminophen, diclofenac Sodium, diphenhydrAMINE, fentaNYL, HYDROmorphone (DILAUDID) injection, lidocaine (PF), lipase/protease/amylase, menthol-cetylpyridinium **OR** phenol, midazolam, midazolam, ondansetron **OR** ondansetron (ZOFRAN) IV, oxyCODONE, oxyCODONE, polyvinyl alcohol, simethicone, sodium chloride flush, witch hazel-glycerin    Objective: Weight change:   Intake/Output Summary (Last 24 hours) at 07/29/2021 1524 Last data filed at 07/29/2021 1429 Gross per 24 hour  Intake 716.2 ml  Output 590 ml  Net 126.2 ml    Blood pressure (!) 120/57, pulse (!) 59, temperature 98.3 F (36.8 C), temperature source Oral, resp. rate 16, height 5' 1" (1.549 m), weight 108.1 kg, SpO2 94 %. Temp:   [97.7 F (36.5 C)-98.5 F (36.9 C)] 98.3 F (36.8 C) (09/22 1430) Pulse Rate:  [54-67] 59 (09/22 1430) Resp:  [15-27] 16 (09/22 1430) BP: (103-156)/(57-68) 120/57 (09/22 1430) SpO2:  [94 %-100 %] 94 % (09/22 1430)  Physical Exam: Physical Exam Constitutional:      General: She is not in acute distress.    Appearance: She is well-developed. She is not diaphoretic.  HENT:     Head: Normocephalic and atraumatic.     Right Ear: External ear normal.     Left Ear: External ear normal.     Mouth/Throat:     Pharynx: No oropharyngeal exudate.  Eyes:     General: No scleral icterus.    Conjunctiva/sclera: Conjunctivae normal.     Pupils: Pupils are equal, round, and reactive to light.  Cardiovascular:     Rate and Rhythm: Normal rate and regular rhythm.  Pulmonary:     Effort: Pulmonary effort is normal. No respiratory distress.     Breath sounds: No wheezing or rales.  Abdominal:     General: Bowel sounds are normal. There is no distension.     Palpations: Abdomen is soft.     Tenderness: There is no abdominal tenderness. There is no rebound.  Musculoskeletal:        General: Deformity present. No tenderness.     Right lower leg: Edema present.     Left lower leg: Edema present.  Lymphadenopathy:     Cervical: No cervical adenopathy.  Skin:    General: Skin is warm and dry.     Coloration: Skin is not pale.     Findings: No erythema or rash.  Neurological:     General: No focal deficit present.     Mental Status: She is alert and oriented to person, place, and time.     Motor: No abnormal muscle tone.     Coordination: Coordination normal.  Psychiatric:        Mood and Affect: Mood normal.        Behavior: Behavior normal.        Thought Content: Thought content normal.        Judgment: Judgment normal.     Skin lesion on right shoulder:      CBC:  BMET Recent Labs    07/27/21 0449 07/28/21 0413 07/29/21 0446  NA 135 135  --   K 4.8 4.1  --   CL 95*  99  --   CO2 29 29  --   GLUCOSE 138* 111*  --   BUN 20 22  --   CREATININE 1.25* 1.23* 1.31*  CALCIUM 9.9 10.2  --       Liver Panel  Recent Labs    07/27/21 0449  PROT 6.0*  ALBUMIN 2.3*  AST 18  ALT 11  ALKPHOS 218*  BILITOT 1.7*        Sedimentation Rate No results for input(s): ESRSEDRATE in the last 72 hours.  C-Reactive Protein No results for input(s): CRP in the last 72 hours.   Micro Results: Recent Results (from the past 720 hour(s))  Urine Culture     Status: Abnormal   Collection Time: 07/01/21  2:39 PM   Specimen: Urine, Clean Catch  Result Value Ref Range Status   Specimen Description   Final    URINE, CLEAN CATCH Performed at [facility], [place] [address] [place], [place] [address]    Special Requests   Final    NONE Performed at [facility], [place] [address] [place], [place] [address]    Culture MULTIPLE SPECIES PRESENT, SUGGEST RECOLLECTION (A)  Final   Report Status 07/02/2021 FINAL  Final  SARS CORONAVIRUS 2 (TAT 6-24 HRS) Nasopharyngeal Nasopharyngeal Swab     Status: None   Collection Time: 07/02/21 11:19 AM   Specimen: Nasopharyngeal Swab  Result Value Ref Range Status   SARS Coronavirus 2 NEGATIVE NEGATIVE Final    Comment: (NOTE) SARS-CoV-2 target nucleic acids are NOT DETECTED.  The SARS-CoV-2 RNA is generally detectable in upper and lower respiratory specimens during the acute phase of infection. Negative results do not preclude SARS-CoV-2 infection, do not rule out co-infections with other pathogens, and should not be used as the sole basis for treatment or other patient management decisions. Negative results must be combined with clinical observations, patient history, and epidemiological information. The expected result is Negative.  Fact Sheet for Patients: SugarRoll.be  Fact Sheet for Healthcare  Providers: https://www.woods-mathews.com/  This test is not yet approved or cleared by the [place] FDA and  has been authorized for detection and/or diagnosis of SARS-CoV-2 by FDA under an Emergency Use Authorization (EUA). This EUA will remain  in effect (meaning this test can be used) for the duration of the COVID-19 declaration under Se ction 564(b)(1) of the Act, 21 U.S.C. section 360bbb-3(b)(1), unless the authorization is terminated or revoked sooner.  Performed at [facility], [place] [address] [place], [place] [address]   Resp Panel by RT-PCR (Flu A&B, Covid) Nasopharyngeal Swab     Status: None   Collection Time: 07/13/21  6:05 PM   Specimen: Nasopharyngeal Swab; Nasopharyngeal(NP) swabs in vial transport medium  Result Value Ref Range Status   SARS Coronavirus 2 by RT PCR NEGATIVE NEGATIVE Final    Comment: (NOTE) SARS-CoV-2 target nucleic acids are NOT DETECTED.  The SARS-CoV-2 RNA is generally detectable in upper respiratory specimens during the acute phase of infection. The lowest concentration of SARS-CoV-2 viral copies this assay can detect is 138 copies/mL. A negative result does not preclude SARS-Cov-2 infection and should not be used as the sole basis for treatment or other patient management decisions. A negative result may occur with  improper specimen collection/handling, submission of specimen other than  nasopharyngeal swab, presence of viral mutation(s) within the areas targeted by this assay, and inadequate number of viral copies(<138 copies/mL). A negative result must be combined with clinical observations, patient history, and epidemiological information. The expected result is Negative.  Fact Sheet for Patients:  EntrepreneurPulse.com.au  Fact Sheet for Healthcare Providers:  IncredibleEmployment.be  This test is no t yet approved or cleared by the [place] FDA and  has been authorized  for detection and/or diagnosis of SARS-CoV-2 by FDA under an Emergency Use Authorization (EUA). This EUA will remain  in effect (meaning this test can be used) for the duration of the COVID-19 declaration under Section 564(b)(1) of the Act, 21 U.S.C.section 360bbb-3(b)(1), unless the authorization is terminated  or revoked sooner.       Influenza A by PCR NEGATIVE NEGATIVE Final   Influenza B by PCR NEGATIVE NEGATIVE Final    Comment: (NOTE) The Xpert Xpress SARS-CoV-2/FLU/RSV plus assay is intended as an aid in the diagnosis of influenza from Nasopharyngeal swab specimens and should not be used as a sole basis for treatment. Nasal washings and aspirates are unacceptable for Xpert Xpress SARS-CoV-2/FLU/RSV testing.  Fact Sheet for Patients: EntrepreneurPulse.com.au  Fact Sheet for Healthcare Providers: IncredibleEmployment.be  This test is not yet approved or cleared by the [place] FDA and has been authorized for detection and/or diagnosis of SARS-CoV-2 by FDA under an Emergency Use Authorization (EUA). This EUA will remain in effect (meaning this test can be used) for the duration of the COVID-19 declaration under Section 564(b)(1) of the Act, 21 U.S.C. section 360bbb-3(b)(1), unless the authorization is terminated or revoked.  Performed at [facility], [place] [address] [place], [place] [address]   Aerobic/Anaerobic Culture w Gram Stain (surgical/deep wound)     Status: None   Collection Time: 07/16/21  4:20 PM   Specimen: Abscess  Result Value Ref Range Status   Specimen Description   Final    ABSCESS RIGHT ARM Performed at [place] [address], [place], [place] [address]    Special Requests   Final    NONE Performed at [facility], [place] [address] [place], [place] [address]    Gram Stain   Final    ABUNDANT WBC PRESENT,BOTH PMN AND MONONUCLEAR NO ORGANISMS  SEEN    Culture   Final    No growth aerobically or anaerobically. Performed at [facility], [place] [address] [place], [place] [address]    Report Status 07/21/2021 FINAL  Final  Aerobic/Anaerobic Culture w Gram Stain (surgical/deep wound)     Status: None   Collection Time: 07/16/21  4:25 PM   Specimen: Joint, Right Shoulder; Synovial Fluid  Result Value Ref Range Status   Specimen Description   Final    JOINT FLUID RIGHT SHOULDER Performed at [place] [address], [place], [place] [address]    Special Requests   Final    NONE Performed at [facility], [place] [address], [place], [place] [address]    Gram Stain   Final    FEW WBC PRESENT, PREDOMINANTLY PMN NO ORGANISMS SEEN    Culture   Final    No growth aerobically or anaerobically. Performed at [facility], [place] [address], [place], [place] [address]    Report Status 07/21/2021 FINAL  Final  Resp Panel by RT-PCR (Flu A&B, Covid) Nasopharyngeal Swab     Status: None   Collection Time: 07/17/21 11:13 AM  Specimen: Nasopharyngeal Swab; Nasopharyngeal(NP) swabs in vial transport medium  Result Value Ref Range Status   SARS Coronavirus 2 by RT PCR NEGATIVE NEGATIVE Final    Comment: (NOTE) SARS-CoV-2 target nucleic acids are NOT DETECTED.  The SARS-CoV-2 RNA is generally detectable in upper respiratory specimens during the acute phase of infection. The lowest concentration of SARS-CoV-2 viral copies this assay can detect is 138 copies/mL. A negative result does not preclude SARS-Cov-2 infection and should not be used as the sole basis for treatment or other patient management decisions. A negative result may occur with  improper specimen collection/handling, submission of specimen other than nasopharyngeal swab, presence of viral mutation(s) within the areas targeted by this assay, and inadequate number of viral copies(<138 copies/mL). A negative result must be  combined with clinical observations, patient history, and epidemiological information. The expected result is Negative.  Fact Sheet for Patients:  EntrepreneurPulse.com.au  Fact Sheet for Healthcare Providers:  IncredibleEmployment.be  This test is no t yet approved or cleared by the [place] FDA and  has been authorized for detection and/or diagnosis of SARS-CoV-2 by FDA under an Emergency Use Authorization (EUA). This EUA will remain  in effect (meaning this test can be used) for the duration of the COVID-19 declaration under Section 564(b)(1) of the Act, 21 U.S.C.section 360bbb-3(b)(1), unless the authorization is terminated  or revoked sooner.       Influenza A by PCR NEGATIVE NEGATIVE Final   Influenza B by PCR NEGATIVE NEGATIVE Final    Comment: (NOTE) The Xpert Xpress SARS-CoV-2/FLU/RSV plus assay is intended as an aid in the diagnosis of influenza from Nasopharyngeal swab specimens and should not be used as a sole basis for treatment. Nasal washings and aspirates are unacceptable for Xpert Xpress SARS-CoV-2/FLU/RSV testing.  Fact Sheet for Patients: EntrepreneurPulse.com.au  Fact Sheet for Healthcare Providers: IncredibleEmployment.be  This test is not yet approved or cleared by the [place] FDA and has been authorized for detection and/or diagnosis of SARS-CoV-2 by FDA under an Emergency Use Authorization (EUA). This EUA will remain in effect (meaning this test can be used) for the duration of the COVID-19 declaration under Section 564(b)(1) of the Act, 21 U.S.C. section 360bbb-3(b)(1), unless the authorization is terminated or revoked.  Performed at [facility], [place] [address], [place], [place] [address]   Surgical PCR screen     Status: None   Collection Time: 07/25/21  5:49 PM   Specimen: Nasal Mucosa; Nasal Swab  Result Value Ref Range Status   MRSA, PCR  NEGATIVE NEGATIVE Final   Staphylococcus aureus NEGATIVE NEGATIVE Final    Comment: (NOTE) The Xpert SA Assay (FDA approved for NASAL specimens in patients 58 years of age and older), is one component of a comprehensive surveillance program. It is not intended to diagnose infection nor to guide or monitor treatment. Performed at [facility], [place] [address] [place], [place] [address]   Aerobic/Anaerobic Culture w Gram Stain (surgical/deep wound)     Status: None (Preliminary result)   Collection Time: 07/26/21  2:37 PM   Specimen: Soft Tissue, Other  Result Value Ref Range Status   Specimen Description   Final    TISSUE SHOULDER RIGHT 1 Performed at [place] [address], [place], [place] [address]    Special Requests   Final    NONE Performed at [facility], [place] [address], [place], [place] [address]    Gram Stain   Final  FEW WBC PRESENT, PREDOMINANTLY PMN NO ORGANISMS SEEN    Culture   Final    NO GROWTH 3 DAYS NO ANAEROBES ISOLATED; CULTURE IN PROGRESS FOR 5 DAYS Performed at [place] [address], [place], [place] [address]    Report Status PENDING  Incomplete  Aerobic/Anaerobic Culture w Gram Stain (surgical/deep wound)     Status: None (Preliminary result)   Collection Time: 07/26/21  2:38 PM   Specimen: Soft Tissue, Other  Result Value Ref Range Status   Specimen Description   Final    TISSUE SHOULDER RIGHT 2 Performed at [place] [address] [place], [place] [address]    Special Requests   Final    NONE Performed at [facility], [place] [address], [place], [place] [address]    Gram Stain   Final    FEW WBC PRESENT, PREDOMINANTLY PMN NO ORGANISMS SEEN    Culture   Final    NO GROWTH 3 DAYS NO ANAEROBES ISOLATED; CULTURE IN PROGRESS FOR 5 DAYS Performed at [place] [address] [place], [place] [address]    Report Status  PENDING  Incomplete    Studies/Results: IR Fluoro Guide CV Line Left  Result Date: 07/29/2021 Criselda Peaches, MD     07/29/2021  2:02 PM Interventional Radiology Procedure Note Procedure: LEFT IJ SL tunneled PowerLine.  Cath tip in the RA. Complications: None Estimated Blood Loss: None Recommendations: - Routine line care Signed, Criselda Peaches, MD   [place] EKG SITE RITE  Result Date: 07/28/2021 If Site Rite image not attached, placement could not be confirmed due to current cardiac rhythm.     Assessment/Plan:  INTERVAL HISTORY:  IR to place central line  Principal Problem:   Abscess of right shoulder Active Problems:   Essential hypertension   Chronic atrial fibrillation (HCC)   Lymphedema   Malignant neoplasm of upper-outer quadrant of left breast in female, estrogen receptor positive (HCC)   Anemia of chronic disease   Acute renal failure superimposed on stage 3b chronic kidney disease (HCC)   Morbid obesity (HCC)   GERD (gastroesophageal reflux disease)   Bilateral pain of leg and foot   Hypothyroidism   Pressure injury of skin   History of pacemaker   Decompensated hepatic cirrhosis ([place])   Abscess   Prosthetic joint infection (HCC)    Katelyn Jackson is a 75 y.o. female with [place] medical problems including atrial fibrillation on Coumadin, morbid obesity chronic lymphedema admitted with systemic infections concerning for infection and with significant fluid above and in the right shoulder where she has had placement of prosthetic shoulder in May 2022, also with pacemaker that was placed this year.  #1 Septic arthritis:  We will treat with 6 weeks of IV antibiotics with empiric ones below unless an organism is isolated on culture.  Diagnosis: Prosthetic shoulder infection  Culture Result: No growth so far  Allergies  Allergen Reactions   Chlorhexidine Gluconate Hives   Penicillins Itching    Has patient had a PCN reaction causing immediate rash,  facial/tongue/throat swelling, SOB or lightheadedness with hypotension: no Has patient had a PCN reaction causing severe rash involving mucus membranes or skin necrosis: No Has patient had a PCN reaction that required hospitalization: No Has patient had a PCN reaction occurring within the last 10 years: No If all of the above answers are "NO", then may proceed with Cephalosporin use. Tolerated Cephalosporin Date: 03/17/21. Td Ancef 2g  07/26/21    OPAT Orders Discharge antibiotics:  Ceftriaxone 2 g daily IV along with  Vancomycin per pharmacy protocol  Aim for Vancomycin trough 15-20 (unless otherwise indicated)   Duration:  6 weeks  End Date:  November 1st, 2022  [facility] Care Per Protocol:  Labs BI- weekly while on IV antibiotics:  _x_ BMP w GFR _x_ Vancomycin trough  Labs  weekly while on IV antibiotics: _x_ CBC with differential _x_ BMP w GFR/CMP _x CRP __x ESR    __ Please pull PIC at completion of IV antibiotics _x_ Please leave PIC in place until doctor has seen patient or been notified  Fax weekly labs to [phone]  Clinic Follow Up Appt:   Katelyn Jackson has an appointment on 08/19/2021 at 945 AM with Dr. Tommy Medal  The [facility] for Infectious Disease is located in the [facility] in [place].  [address], which is located to the left of the elevators.  Phone: [phone]  Fax: [phone]  [email or web address]  She should arrive 15 to 30 minutes prior to her appointment.  I spent 37 minutes with the patient including face to face counseling of the patient re her septic prosthetic shoulder as well as discussing with her son Katelyn Jackson over the phone personally reviewing updated cultures, along with  review of medical records before and during the visit and in coordination of her care.  I have made [name] aware of her appt and SNF should be aware as well  I will sign off for  now.  Please call with further questions.    LOS: 27 days   Alcide Evener 07/29/2021, 3:24 PM

## 2021-07-29 NOTE — Progress Notes (Signed)
   ORTHOPAEDIC PROGRESS NOTE  s/p Procedure(s): RIGHT SHOULDER HARDWARE REMOVAL WITH WASHOUT  SUBJECTIVE: Reports some pain about operative site. Throat is bothering her.   OBJECTIVE: PE: General: laying in hospital bed, NAD RUE: prevena wound vac in place with good seal, still with about 25 cc of bloody drainage noted in canister, hemovac also in place with about 10 cc of bloody drainge in canister, dressings removed, incision is benign with minimal drainage noted, nylon sutures in place, tender to palpation just distal to the incision, swelling and ecchymosis noted here, ROM not tested in early post-op setting, + motor function AIN and PIN distribution, difficulty with ulnar distribution - has difficulty on the other side as well, essential tremor of the right hand, warm well perfused digits  Vitals:   07/28/21 2220 07/29/21 0631  BP: 118/64 (!) 127/59  Pulse: (!) 54 (!) 58  Resp: 16 18  Temp: 98.5 F (36.9 C) 97.7 F (36.5 C)  SpO2: 98% 96%     ASSESSMENT: Katelyn Jackson is a 75 y.o. female doing well postoperatively. POD#3  PLAN: Weightbearing: RUE okay to use right arm for transfers and ADLs at waist level Insicional and dressing care: Hemovac and Prevena removed today. Aquacel placed over incision and guaze and tegaderm placed over the hemovac site. Reinforce as needed Orthopedic device(s): Sling Showering: Hold for now VTE prophylaxis: Okay to resume Eliquis once hemoglobin stable ABLA: Hgb 8.7 today. Continue to monitor.  Pain control: PRN pain medications, preferring oral medications Follow - up plan: 1 week in office Dispo: SNF.  Contact information:  Dr. Ophelia Charter, [name] PA-C , After hours and holidays please check [email or web address] for group call information for [facility]  ID is continuing to follow. They have set up PICC line placement and IV antibiotics. Cultures continue to show no growth. Okay for discharge from orthopedic standpoint once cleared  by medicine, therapies, and ID. Pain medications for discharge printed and placed in patient's chart.   [name], PA-C 07/29/2021

## 2021-07-29 NOTE — Plan of Care (Signed)
  Problem: Pain Managment: Goal: General experience of comfort will improve Outcome: Progressing   Problem: Safety: Goal: Ability to remain free from injury will improve Outcome: Progressing   Problem: Skin Integrity: Goal: Risk for impaired skin integrity will decrease Outcome: Progressing   Problem: Clinical Measurements: Goal: Will remain free from infection Outcome: Progressing   Problem: Activity: Goal: Risk for activity intolerance will decrease Outcome: Progressing

## 2021-07-29 NOTE — Progress Notes (Signed)
Pharmacy Antibiotic Note  Katelyn Jackson is a 75 y.o. female admitted on 07/01/2021 with  septic joint infection .  Pharmacy has been consulted for vancomycin dosing.   ID following. Pt underwent washout and removal of hardware on 9/19  Today, 07/29/2021: D4 vancomycin (D2 Rocephin) SCr improved to baseline; stable WBC stable WNL Afebrile since 9/13 Vanc trough = 19 (drawn appropriately)  Plan: Reduce vancomycin to 500 mg IV daily - given obesity and labile SCr (do not expect SCr to improve any further at this point), concerned for accumulation of vancomycin over time, and high risk for renal insult. Anticipated trough on new regimen should be well above 10 mcg/ml Daily SCr - can reduce frequency once stable at baseline   Height: 5\' 1"  (154.9 cm) Weight: 108.1 kg (238 lb 5.1 oz) IBW/kg (Calculated) : 47.8  Temp (24hrs), Avg:98.2 F (36.8 C), Min:97.7 F (36.5 C), Max:98.7 F (37.1 C)  Recent Labs  Lab 07/25/21 0525 07/26/21 0421 07/27/21 0449 07/28/21 0413 07/29/21 0446 07/29/21 1455  WBC 6.0 4.9 5.2 7.0 5.7  --   CREATININE 1.51* 1.43* 1.25* 1.23* 1.31*  --   VANCOTROUGH  --   --   --   --   --  19     Estimated Creatinine Clearance: 42.1 mL/min (A) (by C-G formula based on SCr of 1.31 mg/dL (H)).    Allergies  Allergen Reactions   Chlorhexidine Gluconate Hives   Penicillins Itching    Has patient had a PCN reaction causing immediate rash, facial/tongue/throat swelling, SOB or lightheadedness with hypotension: no Has patient had a PCN reaction causing severe rash involving mucus membranes or skin necrosis: No Has patient had a PCN reaction that required hospitalization: No Has patient had a PCN reaction occurring within the last 10 years: No If all of the above answers are "NO", then may proceed with Cephalosporin use. Tolerated Cephalosporin Date: 03/17/21. Td Ancef 2g 07/26/21   Antimicrobials this admission: 8/26 CTX >> Cefadroxil 8/29 >> 8/31 9/3 doxy for  abscess >> 9/8  9/8 vancomycin >> 9/12, 9/20>> 9/21 Ceftriaxone >>   Dose adjustments this admission: 9/22 decr vanc 750 >> 500 q24 with VT = 19  Microbiology results: 8/25 UCx: multi species FINAL 9/9 right arm abscess: NGF 9/9 right shoulder joint fluid: NGF 9/19 tissue shoulder right 2: ngtd 9/19 tissue shoulder right 1: ngtd   Thank you for allowing pharmacy to be a part of this patient's care.  Vegas Fritze, Cindie Laroche, PharmD 07/29/2021 7:08 PM

## 2021-07-29 NOTE — Progress Notes (Signed)
PROGRESS NOTE   Katelyn Jackson  UTM:[id number]    DOB: 1946/06/18    DOA: 07/01/2021  PCP: Cassandria Anger, MD   I have briefly reviewed patients previous [facility].  Chief Complaint  Patient presents with   Dysuria    Brief Narrative:  Katelyn Jackson is a 75 yo female with PMH permanent Afib (on Coumadin), HTN, HLD, pacer in place, hx breast cancer, hypothyroidism, chronic bilateral lower extremity lymphedema, morbid obesity, recurrent falls, GERD, pulmonary hypertension who presented to the hospital with worsening weakness and falls at home.  She had endorsed pain in her left ankle, knee, and shoulder. She was initially treated for suspected cellulitis in her left leg due to some increased erythema noted on admission. She was also noted to have a wound over her right shoulder and underwent further work-up for this as well.  She is now s/p I&D with resection arthroplasty of right shoulder with placement of wound VAC on 9/19 and remains on IV antibiotics for same.   See below for further A&P.   Assessment & Plan:  Principal Problem:   Abscess of right shoulder Active Problems:   Essential hypertension   Chronic atrial fibrillation (HCC)   Lymphedema   Malignant neoplasm of upper-outer quadrant of left breast in female, estrogen receptor positive (HCC)   Anemia of chronic disease   Acute renal failure superimposed on stage 3b chronic kidney disease (HCC)   Morbid obesity (HCC)   GERD (gastroesophageal reflux disease)   Bilateral pain of leg and foot   Hypothyroidism   Pressure injury of skin   History of pacemaker   Decompensated hepatic cirrhosis ([place])   Abscess   Prosthetic joint infection (HCC)   Abscess of right shoulder - s/p right shoulder hemiarthroplasty in May 2022 - s/p aspiration of shoulder on 07/16/21 but appears fluid too thick for likely a good sample and pus was noted during procedure -S/p I&D with resection arthroplasty and  cultures taken in the operating room on 9/19.  Wound VAC removed 9/22. - ID has started her on IV vancomycin, added ceftriaxone today and ordered a PICC line. - ID and orthopedics have cleared patient for discharge.  DC likely to SNF in AM.  IR placed central line for prolonged antibiotics.   Decompensated hepatic cirrhosis ([place]) - despite new diagnosis, she's had this a while; RUQ u/s on 9/14 shows nodular liver consistent with diagnosis; also some GB sludge but LFT pattern not consistent with obstruction  - she has had elevated INR despite coumadin washout with ongoing elevated LFTs which may have some confounding from underlying infection; ammonia also upper limit normal on 9/9 and she's responded well to lactulose (rifaximin also started per GI) - GI consulted for further assistance with evaluation - likely NAFLD; no history of etoh use that would explain - further autoimmune workup also commenced.  Only significant for elevated alpha-1 antitrypsin levels.  ANA, antimitochondrial antibody, c-ANCA, p-ANCA and atypical p-ANCA titer: Negative.  HBsAg and HCV antibody negative. - renal function improved with IVF (was overdiuresis with torsemide)   Chronic atrial fibrillation (HCC) - on Coumadin at home prior to admission -Coumadin was held in preparation for surgery. - see INR workup as well  - given new diagnosis of cirrhosis, will transition her to Eliquis once resuming anticoagulation - Hemoglobin has been stable postop.  Started Eliquis per pharmacy.   Elevated INR-resolved as of 07/25/2021 - INR peaked at 9.9 after admission on 8/26 and she  was given Vit K to help reverse  - last Coumadin dose 9/9  - see cirrhosis now as well -INR 1.4 on 9/20.  1.6 today and seems to be somewhat labile.   Acute renal failure superimposed on stage 3b chronic kidney disease ([place]) - patient has history of CKD3b. Baseline creat ~ 1.4 - 1.5, eGFR 31 - 41 - has been on torsemide 40 mg BID likely contributing  to overdiuresis; not consistent with a hepatorenal syndrome at this time - torsemide stopped on 9/15 - s/p IVF resuscitation with good improvement in renal function - continue holding diuretic and will resume as needed - Acute renal failure seems to have resolved.  Creatinine down to 1.25 >1.23.   Acute metabolic encephalopathy-resolved as of 07/23/2021 - Multifactorial and likely some hospital delirium contributing given extended length of stay - patient symptoms include occasional AMS - etiology considered due to toxic/metabolic - continue re-orienting as needed - treating infection - given recently elevated NH3 and now diagnosis consistent with cirrhosis, she is also started on lactulose and rifaximin    Cellulitis of left lower extremity-resolved as of 07/21/2021 - mild erythema on admission from media pic; now treated and resolved    History of pacemaker - stable; at risk for infection seeding without source control  - [facility] on 01/06/21: [employer] pacemaker with model number of ASSURITY MRI PM 2272 and serial number of [id number]   Hypothyroidism - Continue Synthroid   GERD (gastroesophageal reflux disease) - Continue Protonix   Morbid obesity ([place]) - Complicates overall prognosis and care - Body mass index is 43.36 kg/m. - Weight Loss and Dietary Counseling given   Anemia of chronic disease - Baseline hemoglobin 9 to 10 g/dL -Currently at baseline and no signs of bleeding   Malignant neoplasm of upper-outer quadrant of left breast in female, estrogen receptor positive ([place]) - outpatient follow up as indicated    Lymphedema - Stable edema.  No further signs of cellulitis involving lower extremities   Essential hypertension - Continue Coreg   Acute respiratory failure with hypoxia (HCC)-resolved as of 07/21/2021 secondary to fluid overload,  acute on chronic systolic CHF, hypercapnia likely from OSA. Respiratory status much improved.  Patient had adequate  diuresis with negative fluid balance. Continue supplemental oxygen to keep saturation above 94%. Diuretics changed to torsemide  - now on RA    DVT prophylaxis: SCD's Start: 07/26/21 1538 SCDs Start: 07/02/21 0948     Code Status: Full Code Family Communication: Discussed with patient's son at bedside on 9/20.  None at bedside today. Disposition:  Status is: Inpatient  Remains inpatient appropriate because:Inpatient level of care appropriate due to severity of illness  Dispo: The patient is from: Home              Anticipated d/c is to: SNF hopefully in 1 to 2 days.              Patient currently is not medically stable to d/c.   Difficult to place patient No        Consultants:   Infectious disease Orthopedics Gastroenterology  Procedures:   As noted above.  Antimicrobials:    Anti-infectives (From admission, onward)    Start     Dose/Rate Route Frequency Ordered Stop   07/28/21 1600  vancomycin (VANCOREADY) IVPB 750 mg/150 mL        750 mg 150 mL/hr over 60 Minutes Intravenous Every 24 hours 07/27/21 1405     07/28/21  1300  cefTRIAXone (ROCEPHIN) 2 g in sodium chloride 0.9 % 100 mL IVPB        2 g 200 mL/hr over 30 Minutes Intravenous Every 24 hours 07/28/21 1212     07/27/21 1500  vancomycin (VANCOREADY) IVPB 2000 mg/400 mL        2,000 mg 200 mL/hr over 120 Minutes Intravenous  Once 07/27/21 1404 07/27/21 1737   07/26/21 1830  ceFAZolin (ANCEF) IVPB 1 g/50 mL premix        1 g 100 mL/hr over 30 Minutes Intravenous Every 6 hours 07/26/21 1537 07/27/21 0610   07/26/21 1325  vancomycin (VANCOCIN) powder  Status:  Discontinued          As needed 07/26/21 1338 07/26/21 1514   07/26/21 1325  tobramycin (NEBCIN) powder  Status:  Discontinued          As needed 07/26/21 1338 07/26/21 1514   07/26/21 1100  ceFAZolin (ANCEF) IVPB 2g/100 mL premix        2 g 200 mL/hr over 30 Minutes Intravenous On call to O.R. 07/26/21 1055 07/26/21 1316   07/26/21 1056  ceFAZolin  (ANCEF) 2-4 GM/100ML-% IVPB       Note to Pharmacy: Lavon Paganini   : cabinet override      07/26/21 1056 07/26/21 1309   07/22/21 1100  rifaximin (XIFAXAN) tablet 550 mg        550 mg Oral 2 times daily 07/22/21 1005     07/16/21 1200  vancomycin (VANCOREADY) IVPB 750 mg/150 mL  Status:  Discontinued        750 mg 150 mL/hr over 60 Minutes Intravenous Every 24 hours 07/15/21 1242 07/19/21 1537   07/15/21 1400  vancomycin (VANCOREADY) IVPB 2000 mg/400 mL        2,000 mg 200 mL/hr over 120 Minutes Intravenous  Once 07/15/21 1242 07/15/21 1522   07/10/21 1530  doxycycline (VIBRA-TABS) tablet 100 mg  Status:  Discontinued        100 mg Oral Every 12 hours 07/10/21 1437 07/15/21 1127   07/05/21 1245  cefadroxil (DURICEF) capsule 500 mg        500 mg Oral 2 times daily 07/05/21 1155 07/07/21 2217   07/03/21 0900  cefTRIAXone (ROCEPHIN) 2 g in sodium chloride 0.9 % 100 mL IVPB  Status:  Discontinued        2 g 200 mL/hr over 30 Minutes Intravenous Every 24 hours 07/02/21 0950 07/05/21 1155   07/02/21 0915  cefTRIAXone (ROCEPHIN) 2 g in sodium chloride 0.9 % 100 mL IVPB        2 g 200 mL/hr over 30 Minutes Intravenous  Once 07/02/21 0909 07/02/21 1033         Subjective:  Interviewed patient with assistance of her English-speaking son at bedside.  8/10 right shoulder pain early this morning which improved to 3/10 following pain meds.  Objective:   Vitals:   07/29/21 1404 07/29/21 1430 07/29/21 1500 07/29/21 1530  BP: 139/61 (!) 120/57 (!) 104/51 (!) 146/60  Pulse: (!) 58 (!) 59 (!) 56 61  Resp: _0 Temp: 98.4 F (36.9 C) 98.3 F (36.8 C) 98.7 F (37.1 C) 97.9 F (36.6 C)  TempSrc: Oral Oral Oral Oral  SpO2: 97% 94% 94% 98%  Weight:      Height:        General exam: Elderly female, moderately built and morbidly obese lying comfortably propped up in bed without distress. Respiratory system: Clear  to auscultation.  No increased work of breathing. Cardiovascular system:  S1 and S2 heard, irregularly irregular.  No JVD.  Trace bilateral ankle edema.  Telemetry personally reviewed: A. fib with V paced rhythm. Gastrointestinal system: Abdomen is nondistended, soft and nontender. No organomegaly or masses felt. Normal bowel sounds heard. Central nervous system: Alert and oriented. No focal neurological deficits. Extremities: Symmetric 5 x 5 power.  Right upper extremity in sling.  Dressing clean and dry.  Wound VAC has been removed. Skin: No rashes, lesions or ulcers Psychiatry: Judgement and insight appear normal. Mood & affect appropriate.     Data Reviewed:   I have personally reviewed following labs and imaging studies   CBC: Recent Labs  Lab 07/27/21 0449 07/28/21 0413 07/29/21 0446  WBC 5.2 7.0 5.7  NEUTROABS 4.0 5.2 3.4  HGB 9.6* 9.2* 8.7*  HCT 31.5* 30.4* 28.4*  MCV 95.7 97.4 96.9  PLT 194 167 149*    Basic Metabolic Panel: Recent Labs  Lab 07/26/21 0421 07/27/21 0449 07/28/21 0413 07/29/21 0446  NA 141 135 135  --   K 3.7 4.8 4.1  --   CL 100 95* 99  --   CO2 34* 29 29  --   GLUCOSE 88 138* 111*  --   BUN _0 --   CREATININE 1.43* 1.25* 1.23* 1.31*  CALCIUM 10.9* 9.9 10.2  --   MG 2.4 2.3 2.4  --     Liver Function Tests: Recent Labs  Lab 07/25/21 0525 07/26/21 0421 07/27/21 0449  AST _1 ALT _2 ALKPHOS 273* 245* 218*  BILITOT 2.0* 2.2* 1.7*  PROT 6.5 6.3* 6.0*  ALBUMIN 2.5* 2.3* 2.3*    CBG: No results for input(s): GLUCAP in the last 168 hours.  Microbiology Studies:   Recent Results (from the past 240 hour(s))  Surgical PCR screen     Status: None   Collection Time: 07/25/21  5:49 PM   Specimen: Nasal Mucosa; Nasal Swab  Result Value Ref Range Status   MRSA, PCR NEGATIVE NEGATIVE Final   Staphylococcus aureus NEGATIVE NEGATIVE Final    Comment: (NOTE) The Xpert SA Assay (FDA approved for NASAL specimens in patients 46 years of age and older), is one component of a  comprehensive surveillance program. It is not intended to diagnose infection nor to guide or monitor treatment. Performed at [facility], [place] [address] [place], [place] [address]   Aerobic/Anaerobic Culture w Gram Stain (surgical/deep wound)     Status: None (Preliminary result)   Collection Time: 07/26/21  2:37 PM   Specimen: Soft Tissue, Other  Result Value Ref Range Status   Specimen Description   Final    TISSUE SHOULDER RIGHT 1 Performed at [place] [address] [place], [place] [address]    Special Requests   Final    NONE Performed at [facility], [place] [address], [place], [place] [address]    Gram Stain   Final    FEW WBC PRESENT, PREDOMINANTLY PMN NO ORGANISMS SEEN    Culture   Final    NO GROWTH 3 DAYS NO ANAEROBES ISOLATED; CULTURE IN PROGRESS FOR 5 DAYS Performed at [place] [address] [place], [place] [address]    Report Status PENDING  Incomplete  Aerobic/Anaerobic Culture w Gram Stain (surgical/deep wound)     Status: None (Preliminary result)   Collection Time: 07/26/21  2:38 PM  Specimen: Soft Tissue, Other  Result Value Ref Range Status   Specimen Description   Final    TISSUE SHOULDER RIGHT 2 Performed at [place] [address] [place], [place] [address]    Special Requests   Final    NONE Performed at [facility], [place] [address] [place], [place] [address]    Gram Stain   Final    FEW WBC PRESENT, PREDOMINANTLY PMN NO ORGANISMS SEEN    Culture   Final    NO GROWTH 3 DAYS NO ANAEROBES ISOLATED; CULTURE IN PROGRESS FOR 5 DAYS Performed at [place] [address] [place],  [address]    Report Status PENDING  Incomplete     Radiology Studies:  IR Fluoro Guide CV Line Left  Result Date: 07/29/2021 INDICATION: 75 year old female with septic right shoulder. She requires durable venous access for  long-term antibiotic therapy. EXAM: IR LEFT FLUORO GUIDE CV LINE MEDICATIONS: None ANESTHESIA/SEDATION: Moderate (conscious) sedation was employed during this procedure. A total of Versed 2 mg and Fentanyl 100 mcg was administered intravenously. Moderate Sedation Time: 14 minutes. The patient's level of consciousness and vital signs were monitored continuously by radiology nursing throughout the procedure under my direct supervision. FLUOROSCOPY TIME:  Fluoroscopy Time: 0 minutes 30 seconds (2 mGy). COMPLICATIONS: None immediate. PROCEDURE: Informed written consent was obtained from the patient after a thorough discussion of the procedural risks, benefits and alternatives. All questions were addressed. Maximal Sterile Barrier Technique was utilized including caps, mask, sterile gowns, sterile gloves, sterile drape, hand hygiene and skin antiseptic. A timeout was performed prior to the initiation of the procedure. The left internal jugular vein was interrogated with ultrasound and found to be widely patent. An image was obtained and stored for the medical record. Local anesthesia was attained by infiltration with 1% lidocaine. A small dermatotomy was made. Under real-time sonographic guidance, the vessel was punctured with a 21 gauge micropuncture needle. Using standard technique, the initial micro needle was exchanged over a 0.018 micro wire for a peel-away sheath. A single-lumen power injectable tunneled central line was then cut to length. A skin exit site inferior to the clavicle was selected. Local anesthesia was attained by infiltration with 1% lidocaine. A small dermatotomy was made. The catheter was tunneled from the skin exit site to the dermatotomy overlying the venous access site. The catheter was advanced through the peel-away sheath and positioned under fluoroscopy in the upper right atrium. The catheter flushes and aspirates easily. The catheter was flushed and capped. The catheter was secured to the  skin with 0 Prolene suture. An image was obtained and stored for the medical record. The dermatotomy at the venous access site was sealed with Dermabond. Sterile bandages were placed. IMPRESSION: Successful placement of left IJ single-lumen tunneled central power line. Catheter tip is in the upper right atrium and the device is ready for immediate use. Electronically Signed   By: Jacqulynn Cadet M.D.   On: 07/29/2021 15:07   [place] EKG SITE RITE  Result Date: 07/28/2021 If Site Rite image not attached, placement could not be confirmed due to current cardiac rhythm.    Scheduled Meds:    apixaban  5 mg Oral BID   carvedilol  6.25 mg Oral BID WC   cholecalciferol  2,000 Units Oral Daily   docusate sodium  100 mg Oral BID   exemestane  25 mg Oral QPC breakfast   lactulose  10 g Oral TID  levothyroxine  50 mcg Oral QAC breakfast   mupirocin ointment  1 application Nasal BID   pantoprazole  40 mg Oral Daily   potassium chloride  20 mEq Oral Daily   pravastatin  20 mg Oral q1800   rifaximin  550 mg Oral BID   vitamin B-12  2,000 mcg Oral Daily    Continuous Infusions:    sodium chloride 250 mL (07/16/21 1319)   cefTRIAXone (ROCEPHIN)  IV 2 g (07/29/21 1408)   vancomycin 750 mg (07/28/21 1545)     LOS: 27 days     Vernell Leep, MD, [place], [facility]. [facility]    To contact the attending provider between 7A-7P or the covering provider during after hours 7P-7A, please log into the web site [email or web address] and access using universal [place] password for that web site. If you do not have the password, please call the hospital operator.  07/29/2021, 5:01 PM

## 2021-07-29 NOTE — Procedures (Signed)
Interventional Radiology Procedure Note  Procedure: LEFT IJ SL tunneled PowerLine.  Cath tip in the RA.  Complications: None  Estimated Blood Loss: None  Recommendations: - Routine line care    Signed,  Criselda Peaches, MD

## 2021-07-30 DIAGNOSIS — M19011 Primary osteoarthritis, right shoulder: Secondary | ICD-10-CM | POA: Diagnosis not present

## 2021-07-30 DIAGNOSIS — T8450XD Infection and inflammatory reaction due to unspecified internal joint prosthesis, subsequent encounter: Secondary | ICD-10-CM | POA: Diagnosis not present

## 2021-07-30 DIAGNOSIS — M25532 Pain in left wrist: Secondary | ICD-10-CM | POA: Diagnosis not present

## 2021-07-30 DIAGNOSIS — E876 Hypokalemia: Secondary | ICD-10-CM | POA: Diagnosis not present

## 2021-07-30 DIAGNOSIS — M79672 Pain in left foot: Secondary | ICD-10-CM | POA: Diagnosis not present

## 2021-07-30 DIAGNOSIS — I4891 Unspecified atrial fibrillation: Secondary | ICD-10-CM | POA: Diagnosis not present

## 2021-07-30 DIAGNOSIS — L02413 Cutaneous abscess of right upper limb: Secondary | ICD-10-CM | POA: Diagnosis not present

## 2021-07-30 DIAGNOSIS — Z170 Estrogen receptor positive status [ER+]: Secondary | ICD-10-CM | POA: Diagnosis not present

## 2021-07-30 DIAGNOSIS — K7290 Hepatic failure, unspecified without coma: Secondary | ICD-10-CM | POA: Diagnosis not present

## 2021-07-30 DIAGNOSIS — E039 Hypothyroidism, unspecified: Secondary | ICD-10-CM | POA: Diagnosis not present

## 2021-07-30 DIAGNOSIS — Z792 Long term (current) use of antibiotics: Secondary | ICD-10-CM | POA: Diagnosis not present

## 2021-07-30 DIAGNOSIS — M175 Other unilateral secondary osteoarthritis of knee: Secondary | ICD-10-CM | POA: Diagnosis not present

## 2021-07-30 DIAGNOSIS — I890 Lymphedema, not elsewhere classified: Secondary | ICD-10-CM | POA: Diagnosis not present

## 2021-07-30 DIAGNOSIS — C50412 Malignant neoplasm of upper-outer quadrant of left female breast: Secondary | ICD-10-CM | POA: Diagnosis not present

## 2021-07-30 DIAGNOSIS — L853 Xerosis cutis: Secondary | ICD-10-CM | POA: Diagnosis not present

## 2021-07-30 DIAGNOSIS — R0989 Other specified symptoms and signs involving the circulatory and respiratory systems: Secondary | ICD-10-CM | POA: Diagnosis not present

## 2021-07-30 DIAGNOSIS — Z853 Personal history of malignant neoplasm of breast: Secondary | ICD-10-CM | POA: Diagnosis not present

## 2021-07-30 DIAGNOSIS — K5903 Drug induced constipation: Secondary | ICD-10-CM | POA: Diagnosis not present

## 2021-07-30 DIAGNOSIS — Z9049 Acquired absence of other specified parts of digestive tract: Secondary | ICD-10-CM | POA: Diagnosis not present

## 2021-07-30 DIAGNOSIS — L03119 Cellulitis of unspecified part of limb: Secondary | ICD-10-CM | POA: Diagnosis not present

## 2021-07-30 DIAGNOSIS — U071 COVID-19: Secondary | ICD-10-CM | POA: Diagnosis not present

## 2021-07-30 DIAGNOSIS — K7460 Unspecified cirrhosis of liver: Secondary | ICD-10-CM | POA: Diagnosis not present

## 2021-07-30 DIAGNOSIS — D649 Anemia, unspecified: Secondary | ICD-10-CM | POA: Diagnosis not present

## 2021-07-30 DIAGNOSIS — Z923 Personal history of irradiation: Secondary | ICD-10-CM | POA: Diagnosis not present

## 2021-07-30 DIAGNOSIS — M79605 Pain in left leg: Secondary | ICD-10-CM | POA: Diagnosis not present

## 2021-07-30 DIAGNOSIS — I4821 Permanent atrial fibrillation: Secondary | ICD-10-CM | POA: Diagnosis not present

## 2021-07-30 DIAGNOSIS — R1084 Generalized abdominal pain: Secondary | ICD-10-CM | POA: Diagnosis not present

## 2021-07-30 DIAGNOSIS — L209 Atopic dermatitis, unspecified: Secondary | ICD-10-CM | POA: Diagnosis not present

## 2021-07-30 DIAGNOSIS — R39198 Other difficulties with micturition: Secondary | ICD-10-CM | POA: Diagnosis not present

## 2021-07-30 DIAGNOSIS — R531 Weakness: Secondary | ICD-10-CM | POA: Diagnosis not present

## 2021-07-30 DIAGNOSIS — R5381 Other malaise: Secondary | ICD-10-CM | POA: Diagnosis not present

## 2021-07-30 DIAGNOSIS — R194 Change in bowel habit: Secondary | ICD-10-CM | POA: Diagnosis present

## 2021-07-30 DIAGNOSIS — R319 Hematuria, unspecified: Secondary | ICD-10-CM | POA: Diagnosis not present

## 2021-07-30 DIAGNOSIS — R509 Fever, unspecified: Secondary | ICD-10-CM | POA: Diagnosis not present

## 2021-07-30 DIAGNOSIS — E669 Obesity, unspecified: Secondary | ICD-10-CM | POA: Diagnosis not present

## 2021-07-30 DIAGNOSIS — D631 Anemia in chronic kidney disease: Secondary | ICD-10-CM | POA: Diagnosis not present

## 2021-07-30 DIAGNOSIS — M6281 Muscle weakness (generalized): Secondary | ICD-10-CM | POA: Diagnosis not present

## 2021-07-30 DIAGNOSIS — Z452 Encounter for adjustment and management of vascular access device: Secondary | ICD-10-CM | POA: Diagnosis not present

## 2021-07-30 DIAGNOSIS — B001 Herpesviral vesicular dermatitis: Secondary | ICD-10-CM | POA: Diagnosis not present

## 2021-07-30 DIAGNOSIS — R42 Dizziness and giddiness: Secondary | ICD-10-CM | POA: Diagnosis not present

## 2021-07-30 DIAGNOSIS — I482 Chronic atrial fibrillation, unspecified: Secondary | ICD-10-CM | POA: Diagnosis not present

## 2021-07-30 DIAGNOSIS — I517 Cardiomegaly: Secondary | ICD-10-CM | POA: Diagnosis not present

## 2021-07-30 DIAGNOSIS — Z880 Allergy status to penicillin: Secondary | ICD-10-CM | POA: Diagnosis not present

## 2021-07-30 DIAGNOSIS — I272 Pulmonary hypertension, unspecified: Secondary | ICD-10-CM | POA: Diagnosis not present

## 2021-07-30 DIAGNOSIS — M00811 Arthritis due to other bacteria, right shoulder: Secondary | ICD-10-CM | POA: Diagnosis not present

## 2021-07-30 DIAGNOSIS — L02224 Furuncle of groin: Secondary | ICD-10-CM | POA: Diagnosis not present

## 2021-07-30 DIAGNOSIS — R262 Difficulty in walking, not elsewhere classified: Secondary | ICD-10-CM | POA: Diagnosis not present

## 2021-07-30 DIAGNOSIS — L308 Other specified dermatitis: Secondary | ICD-10-CM | POA: Diagnosis not present

## 2021-07-30 DIAGNOSIS — M170 Bilateral primary osteoarthritis of knee: Secondary | ICD-10-CM | POA: Diagnosis not present

## 2021-07-30 DIAGNOSIS — N184 Chronic kidney disease, stage 4 (severe): Secondary | ICD-10-CM | POA: Diagnosis not present

## 2021-07-30 DIAGNOSIS — R102 Pelvic and perineal pain: Secondary | ICD-10-CM | POA: Diagnosis not present

## 2021-07-30 DIAGNOSIS — Z7901 Long term (current) use of anticoagulants: Secondary | ICD-10-CM | POA: Diagnosis not present

## 2021-07-30 DIAGNOSIS — R109 Unspecified abdominal pain: Secondary | ICD-10-CM | POA: Diagnosis not present

## 2021-07-30 DIAGNOSIS — I129 Hypertensive chronic kidney disease with stage 1 through stage 4 chronic kidney disease, or unspecified chronic kidney disease: Secondary | ICD-10-CM | POA: Diagnosis not present

## 2021-07-30 DIAGNOSIS — K5900 Constipation, unspecified: Secondary | ICD-10-CM | POA: Diagnosis not present

## 2021-07-30 DIAGNOSIS — Z950 Presence of cardiac pacemaker: Secondary | ICD-10-CM | POA: Diagnosis not present

## 2021-07-30 DIAGNOSIS — N6002 Solitary cyst of left breast: Secondary | ICD-10-CM | POA: Diagnosis not present

## 2021-07-30 DIAGNOSIS — Z2239 Carrier of other specified bacterial diseases: Secondary | ICD-10-CM | POA: Diagnosis not present

## 2021-07-30 DIAGNOSIS — Z96611 Presence of right artificial shoulder joint: Secondary | ICD-10-CM | POA: Diagnosis not present

## 2021-07-30 DIAGNOSIS — I10 Essential (primary) hypertension: Secondary | ICD-10-CM | POA: Diagnosis not present

## 2021-07-30 DIAGNOSIS — Z4889 Encounter for other specified surgical aftercare: Secondary | ICD-10-CM | POA: Diagnosis not present

## 2021-07-30 DIAGNOSIS — Z6841 Body Mass Index (BMI) 40.0 and over, adult: Secondary | ICD-10-CM | POA: Diagnosis not present

## 2021-07-30 DIAGNOSIS — N1832 Chronic kidney disease, stage 3b: Secondary | ICD-10-CM | POA: Diagnosis not present

## 2021-07-30 DIAGNOSIS — J029 Acute pharyngitis, unspecified: Secondary | ICD-10-CM | POA: Diagnosis not present

## 2021-07-30 DIAGNOSIS — E785 Hyperlipidemia, unspecified: Secondary | ICD-10-CM | POA: Diagnosis not present

## 2021-07-30 DIAGNOSIS — Z79899 Other long term (current) drug therapy: Secondary | ICD-10-CM | POA: Diagnosis not present

## 2021-07-30 DIAGNOSIS — N189 Chronic kidney disease, unspecified: Secondary | ICD-10-CM | POA: Diagnosis not present

## 2021-07-30 DIAGNOSIS — M25569 Pain in unspecified knee: Secondary | ICD-10-CM | POA: Diagnosis not present

## 2021-07-30 DIAGNOSIS — Y848 Other medical procedures as the cause of abnormal reaction of the patient, or of later complication, without mention of misadventure at the time of the procedure: Secondary | ICD-10-CM | POA: Diagnosis not present

## 2021-07-30 DIAGNOSIS — M109 Gout, unspecified: Secondary | ICD-10-CM | POA: Diagnosis not present

## 2021-07-30 DIAGNOSIS — R600 Localized edema: Secondary | ICD-10-CM | POA: Diagnosis not present

## 2021-07-30 DIAGNOSIS — N179 Acute kidney failure, unspecified: Secondary | ICD-10-CM | POA: Diagnosis not present

## 2021-07-30 DIAGNOSIS — M1990 Unspecified osteoarthritis, unspecified site: Secondary | ICD-10-CM | POA: Diagnosis not present

## 2021-07-30 DIAGNOSIS — I959 Hypotension, unspecified: Secondary | ICD-10-CM | POA: Diagnosis not present

## 2021-07-30 DIAGNOSIS — R339 Retention of urine, unspecified: Secondary | ICD-10-CM | POA: Diagnosis not present

## 2021-07-30 DIAGNOSIS — R52 Pain, unspecified: Secondary | ICD-10-CM | POA: Diagnosis not present

## 2021-07-30 DIAGNOSIS — R059 Cough, unspecified: Secondary | ICD-10-CM | POA: Diagnosis not present

## 2021-07-30 DIAGNOSIS — M25511 Pain in right shoulder: Secondary | ICD-10-CM | POA: Diagnosis not present

## 2021-07-30 DIAGNOSIS — Z743 Need for continuous supervision: Secondary | ICD-10-CM | POA: Diagnosis not present

## 2021-07-30 DIAGNOSIS — R0902 Hypoxemia: Secondary | ICD-10-CM | POA: Diagnosis not present

## 2021-07-30 DIAGNOSIS — Z7401 Bed confinement status: Secondary | ICD-10-CM | POA: Diagnosis not present

## 2021-07-30 DIAGNOSIS — Z79811 Long term (current) use of aromatase inhibitors: Secondary | ICD-10-CM | POA: Diagnosis not present

## 2021-07-30 LAB — CBC WITH DIFFERENTIAL/PLATELET
Abs Immature Granulocytes: 0.02 10*3/uL (ref 0.00–0.07)
Basophils Absolute: 0.1 10*3/uL (ref 0.0–0.1)
Basophils Relative: 1 %
Eosinophils Absolute: 0.1 10*3/uL (ref 0.0–0.5)
Eosinophils Relative: 2 %
HCT: 27.2 % — ABNORMAL LOW (ref 36.0–46.0)
Hemoglobin: 8.2 g/dL — ABNORMAL LOW (ref 12.0–15.0)
Immature Granulocytes: 0 %
Lymphocytes Relative: 19 %
Lymphs Abs: 1 10*3/uL (ref 0.7–4.0)
MCH: 29.9 pg (ref 26.0–34.0)
MCHC: 30.1 g/dL (ref 30.0–36.0)
MCV: 99.3 fL (ref 80.0–100.0)
Monocytes Absolute: 0.6 10*3/uL (ref 0.1–1.0)
Monocytes Relative: 11 %
Neutro Abs: 3.4 10*3/uL (ref 1.7–7.7)
Neutrophils Relative %: 67 %
Platelets: 133 10*3/uL — ABNORMAL LOW (ref 150–400)
RBC: 2.74 MIL/uL — ABNORMAL LOW (ref 3.87–5.11)
RDW: 16.4 % — ABNORMAL HIGH (ref 11.5–15.5)
WBC: 5.1 10*3/uL (ref 4.0–10.5)
nRBC: 0 % (ref 0.0–0.2)

## 2021-07-30 LAB — BASIC METABOLIC PANEL
Anion gap: 7 (ref 5–15)
BUN: 17 mg/dL (ref 8–23)
CO2: 29 mmol/L (ref 22–32)
Calcium: 9.6 mg/dL (ref 8.9–10.3)
Chloride: 98 mmol/L (ref 98–111)
Creatinine, Ser: 1.05 mg/dL — ABNORMAL HIGH (ref 0.44–1.00)
GFR, Estimated: 55 mL/min — ABNORMAL LOW (ref >60)
Glucose, Bld: 86 mg/dL (ref 70–99)
Potassium: 3.8 mmol/L (ref 3.5–5.1)
Sodium: 134 mmol/L — ABNORMAL LOW (ref 135–145)

## 2021-07-30 LAB — PROTIME-INR
INR: 2.9 — ABNORMAL HIGH (ref 0.8–1.2)
Prothrombin Time: 30.4 seconds — ABNORMAL HIGH (ref 11.4–15.2)

## 2021-07-30 MED ORDER — ACETAMINOPHEN 325 MG PO TABS: 650.0000 mg | ORAL_TABLET | Freq: Four times a day (QID) | ORAL | Status: DC | PRN

## 2021-07-30 MED ORDER — TORSEMIDE 20 MG PO TABS: 20.0000 mg | ORAL_TABLET | Freq: Every day | ORAL | Status: DC

## 2021-07-30 MED ORDER — LACTULOSE 10 GM/15ML PO SOLN: 10.0000 g | Freq: Three times a day (TID) | ORAL | Status: DC

## 2021-07-30 MED ORDER — TAMOXIFEN CITRATE 20 MG PO TABS: 20.0000 mg | ORAL_TABLET | Freq: Every day | ORAL | Status: DC

## 2021-07-30 MED ORDER — VANCOMYCIN IV (FOR PTA / DISCHARGE USE ONLY): 500.0000 mg | INTRAVENOUS | 0 refills | Status: AC

## 2021-07-30 MED ORDER — SIMETHICONE 40 MG/0.6ML PO SUSP: 20.0000 mg | Freq: Four times a day (QID) | ORAL | Status: DC | PRN

## 2021-07-30 MED ORDER — CEFTRIAXONE IV (FOR PTA / DISCHARGE USE ONLY): 2.0000 g | INTRAVENOUS | 0 refills | Status: AC

## 2021-07-30 MED ORDER — POLYVINYL ALCOHOL 1.4 % OP SOLN: 1.0000 [drp] | OPHTHALMIC | Status: DC | PRN

## 2021-07-30 MED ORDER — ALLOPURINOL 100 MG PO TABS
100.0000 mg | ORAL_TABLET | Freq: Every day | ORAL | Status: DC
Start: 2021-07-30 — End: 2021-11-25

## 2021-07-30 MED ORDER — RIFAXIMIN 550 MG PO TABS
550.0000 mg | ORAL_TABLET | Freq: Two times a day (BID) | ORAL | Status: DC
Start: 2021-07-30 — End: 2021-11-25

## 2021-07-30 MED ORDER — APIXABAN 5 MG PO TABS: 5.0000 mg | ORAL_TABLET | Freq: Two times a day (BID) | ORAL | Status: DC

## 2021-07-30 MED ORDER — CARVEDILOL 6.25 MG PO TABS: 6.2500 mg | ORAL_TABLET | Freq: Two times a day (BID) | ORAL | Status: DC

## 2021-07-30 MED ORDER — HEPARIN SOD (PORK) LOCK FLUSH 100 UNIT/ML IV SOLN
250.0000 [IU] | INTRAVENOUS | Status: AC | PRN
  Administered 2021-07-30: 250 [IU]
  Filled 2021-07-30: qty 2.5

## 2021-07-30 MED ORDER — PRAVASTATIN SODIUM 20 MG PO TABS: 20.0000 mg | ORAL_TABLET | Freq: Every day | ORAL | Status: DC

## 2021-07-30 MED ORDER — DOCUSATE SODIUM 100 MG PO CAPS: 100.0000 mg | ORAL_CAPSULE | Freq: Two times a day (BID) | ORAL | Status: DC

## 2021-07-30 NOTE — Plan of Care (Signed)
  Problem: Education: Goal: Knowledge of General Education information will improve Description: Including pain rating scale, medication(s)/side effects and non-pharmacologic comfort measures Outcome: Progressing   Problem: Activity: Goal: Risk for activity intolerance will decrease Outcome: Progressing   Problem: Nutrition: Goal: Adequate nutrition will be maintained Outcome: Progressing   

## 2021-07-30 NOTE — Discharge Summary (Addendum)
Physician Discharge Summary  [name] MVE:[id number] DOB: 28-Nov-1945  PCP: Cassandria Anger, MD  Admitted from: Home Discharged to: SNF  Admit date: 07/01/2021 Discharge date: 07/30/2021  Recommendations for Outpatient Follow-up:    Contact information for follow-up providers     Hiram Gash, MD Follow up in 1 week(s).   Specialty: Orthopedic Surgery Why: For wound re-check Contact information: [address] [place] [address] [phone]         MD at SNF Follow up.   Why: To be seen in 2 to 3 days with repeat labs (CBC, CMP).        Plotnikov, [name], MD. Schedule an appointment as soon as possible for a visit.   Specialty: Internal Medicine Why: To be seen upon discharge from SNF. Contact information: [place] [address] [phone]         Truitt Merle, MD. Schedule an appointment as soon as possible for a visit.   Specialties: Hematology, Oncology Contact information: [place] [address] [phone]              Contact information for after-discharge care     Destination     [facility] Preferred SNF .   Service: Skilled Nursing Contact information: [address] [place]: None    Equipment/Devices: TBD at [facility]    Discharge Condition: Improved and stable   Code Status: Full Code Diet recommendation:  Discharge Diet Orders (From admission, onward)     Start     Ordered   07/30/21 0000  Diet - low sodium heart healthy        07/30/21 1334             Discharge Diagnoses:  Principal Problem:   Abscess of right shoulder Active Problems:   Essential hypertension   Chronic atrial fibrillation (HCC)   Lymphedema   Malignant neoplasm of upper-outer quadrant of left breast in female, estrogen receptor positive ([place])   Anemia of chronic disease    Acute renal failure superimposed on stage 3b chronic kidney disease (HCC)   Morbid obesity (HCC)   GERD (gastroesophageal reflux disease)   Bilateral pain of leg and foot   Hypothyroidism   Pressure injury of skin   History of pacemaker   Decompensated hepatic cirrhosis ([place])   Abscess   Prosthetic joint infection ([place])   Brief Summary: Katelyn Jackson is a 75 yo female with PMH permanent Afib (on Coumadin), HTN, HLD, pacer in place, hx breast cancer, hypothyroidism, chronic bilateral lower extremity lymphedema, morbid obesity, recurrent falls, GERD, pulmonary hypertension who presented to the hospital with worsening weakness and falls at home.  She had endorsed pain in her left ankle, knee, and shoulder. She was initially treated for suspected cellulitis in her left leg due to some increased erythema noted on admission. She was also noted to have a wound over her right shoulder and underwent further work-up for this as well.  She is now s/p I&D with resection arthroplasty of right shoulder with placement of wound VAC on 9/19 and remains on IV antibiotics for same.   See below for further A&P.     Assessment & Plan:   Abscess of right shoulder - s/p right shoulder hemiarthroplasty in May 2022 - s/p aspiration of shoulder on  07/16/21 but appears fluid too thick for likely a good sample and pus was noted during procedure -S/p I&D with resection arthroplasty and cultures taken in the operating room on 9/19.  Wound VAC removed 9/22. - ID has started her on IV vancomycin, added ceftriaxone.  Total 6 weeks of antibiotics as per OPAT protocol.  ID follow-up as outpatient.  IR placed left IJ SL tunneled power line on 9/22. - ID and orthopedics have cleared patient for discharge. - Improved and stable.   Decompensated hepatic cirrhosis (Katelyn Jackson) - despite new diagnosis, she's had this a while; RUQ u/s on 9/14 shows nodular liver consistent with diagnosis; also some GB sludge but LFT pattern not  consistent with obstruction  - she has had elevated INR despite coumadin washout with ongoing elevated LFTs which may have some confounding from underlying infection; ammonia also upper limit normal on 9/9 and she's responded well to lactulose (rifaximin also started per GI) - GI consulted for further assistance with evaluation - likely NAFLD; no history of etoh use that would explain - further autoimmune workup also commenced.  Only significant for elevated alpha-1 antitrypsin levels.  ANA, antimitochondrial antibody, c-ANCA, p-ANCA and atypical p-ANCA titer: Negative.  HBsAg and HCV antibody negative. - renal function improved with IVF (was overdiuresis with torsemide) - As per [facility] will sign off on 07/23/2021: Cirrhosis on ultrasound, likely NASH (no ascites, no prior history of variceal bleeding.  Negative hepatitis panel.  Normal ceruloplasmin, alpha-1 antitrypsin.  Hepatic encephalopathy with asterixis-on lactulose and Xifaxan and responding well. -Improved and stable.  Continue meds as noted below.  Could consider outpatient GI follow-up as deemed necessary.   Chronic atrial fibrillation (HCC) - on Coumadin at home prior to admission -Coumadin was held in preparation for surgery. - see INR workup as well  - given new diagnosis of cirrhosis, transitioned her to Eliquis postsurgery - Hemoglobin has been stable postop.  Started Eliquis per pharmacy.   Elevated INR-resolved as of 07/25/2021 - INR peaked at 9.9 after admission on 8/26 and she was given Vit K to help reverse  -Due to cirrhosis, Coumadin discontinued indefinitely.   Acute renal failure superimposed on stage 3b chronic kidney disease ([place]) - patient has history of CKD3b. Baseline creat ~ 1.4 - 1.5, eGFR 31 - 41 - has been on torsemide 40 mg BID likely contributing to overdiuresis; not consistent with a hepatorenal syndrome at this time - torsemide stopped on 9/15 - s/p IVF resuscitation with good improvement in renal  function - Acute renal failure has resolved.  Resumed torsemide at much lower than prior dose.  Monitor BMP closely at SNF.   Acute metabolic encephalopathy-resolved as of 07/23/2021 - Multifactorial and likely some hospital delirium contributing given extended length of stay - etiology considered due to toxic/metabolic - given recently elevated NH3 and now diagnosis consistent with cirrhosis, she is also started on lactulose and rifaximin    Cellulitis of left lower extremity-resolved as of 07/21/2021 - mild erythema on admission from media pic; now treated and resolved    History of pacemaker - stable; at risk for infection seeding without source control  - implanted at [facility] on 01/06/21: [place] pacemaker with model number of [employer] MRI PM 2272 and serial number of [id number]   Hypothyroidism - Continue Synthroid   GERD (gastroesophageal reflux disease) - Continue Protonix   Morbid obesity ([place]) - Complicates overall prognosis and care - Body mass index is 43.36 kg/m. - Weight Loss and Dietary Counseling  given   Anemia of chronic disease - Baseline hemoglobin 9 to 10 g/dL.  Some gradual drop in her hemoglobin in the absence of overt bleeding.  This may be multifactorial related to acute illness, chronic disease, some bleeding from right shoulder surgery and wound VAC which has now been removed, phlebotomies in the hospital etc. -Follow CBC closely at SNF and transfuse PRBCs if hemoglobin 7 g or lower.   Malignant neoplasm of upper-outer quadrant of left breast in female, estrogen receptor positive ([place]) -Follows with Dr. Burr Medico, oncology.  Supposed to start tamoxifen after finishing course of Aromasin.   Lymphedema - Stable leg edema.  No further signs of cellulitis involving lower extremities - Restarted torsemide at low-dose.   Essential hypertension - Continue Coreg at reduced dose.   Acute respiratory failure with hypoxia (HCC)-resolved as of 07/21/2021 secondary to  fluid overload,  acute on chronic systolic CHF, hypercapnia likely from OSA. Respiratory status much improved.  Patient had adequate diuresis with negative fluid balance. Continue supplemental oxygen to keep saturation above 94%. Diuretics changed to torsemide  - now on RA    Right hip pain: - Sustained post fall couple of weeks ago.  Reports right hip to knee pain.  Worse with weightbearing.  Intermittent pain.  Extensive imaging without fractures.  Extensive osteoarthritic changes in the knees.  Communicated with orthopedics.  Supportive care.  Could consider adding lidocaine patch to right hip if needed.  Pressure Injury 07/03/21 Thigh Posterior;Right Stage 3 -  Full thickness tissue loss. Subcutaneous fat may be visible but bone, tendon or muscle are NOT exposed. (Active)  07/03/21 0453  Location: Thigh  Location Orientation: Posterior;Right  Staging: Stage 3 -  Full thickness tissue loss. Subcutaneous fat may be visible but bone, tendon or muscle are NOT exposed.  Wound Description (Comments):   Present on Admission: Yes     Pressure Injury 07/03/21 Sacrum Stage 2 -  Partial thickness loss of dermis presenting as a shallow open injury with a red, pink wound bed without slough. (Active)  07/03/21 2105  Location: Sacrum  Location Orientation:   Staging: Stage 2 -  Partial thickness loss of dermis presenting as a shallow open injury with a red, pink wound bed without slough.  Wound Description (Comments):   Present on Admission: Yes   Foam dressing to above pressure wounds daily and prn.       Consultants:   Infectious disease Orthopedics Gastroenterology Interventional radiology   Procedures:   As noted above.   Discharge Instructions  Discharge Instructions     (HEART FAILURE PATIENTS) Call MD:  Anytime you have any of the following symptoms: 1) 3 pound weight gain in 24 hours or 5 pounds in 1 week 2) shortness of breath, with or without a dry hacking cough 3)  swelling in the hands, feet or stomach 4) if you have to sleep on extra pillows at night in order to breathe.   Complete by: As directed    [facility] pharmacist to adjust dose for Vancomycin, Aminoglycosides and other anti-infective therapies as requested by physician.   Complete by: As directed    [facility] to provide Cath Flo 65m   Complete by: As directed    Administer for PICC line occlusion and as ordered by physician for other access device issues.   Anaphylaxis Kit: Provided to treat any anaphylactic reaction to the medication being provided to the patient if First Dose or when requested by physician   Complete  by: As directed    Epinephrine 49m/ml vial / amp: Administer 0.340m(0.13m67msubcutaneously once for moderate to severe anaphylaxis, nurse to call physician and pharmacy when reaction occurs and call 911 if needed for immediate care   Diphenhydramine 30m12m IV vial: Administer 25-30mg713mIM PRN for first dose reaction, rash, itching, mild reaction, nurse to call physician and pharmacy when reaction occurs   Sodium Chloride 0.9% NS 500ml 75mAdminister if needed for hypovolemic blood pressure drop or as ordered by physician after call to physician with anaphylactic reaction   Call MD for:  difficulty breathing, headache or visual disturbances   Complete by: As directed    Call MD for:  extreme fatigue   Complete by: As directed    Call MD for:  persistant dizziness or light-headedness   Complete by: As directed    Call MD for:  persistant nausea and vomiting   Complete by: As directed    Call MD for:  redness, tenderness, or signs of infection (pain, swelling, redness, odor or green/yellow discharge around incision site)   Complete by: As directed    Call MD for:  severe uncontrolled pain   Complete by: As directed    Call MD for:  temperature >100.4   Complete by: As directed    Change dressing on IV access line weekly and PRN   Complete by: As  directed    Diet - low sodium heart healthy   Complete by: As directed    Discharge instructions   Complete by: As directed    Right shoulder postop instructions as per orthopedics as follows:  Weightbearing: RUE okay to use right arm for transfers and ADLs at waist level Insicional and dressing care: Hemovac and Prevena removed. Aquacel placed over incision and guaze and tegaderm placed over the hemovac site. Reinforce as needed Orthopedic device(s): Sling Showering: Hold for now   Discharge wound care:   Complete by: As directed    Pressure Injury 07/03/21 Thigh Posterior;Right Stage 3 -  Full thickness tissue loss. Subcutaneous fat may be visible but bone, tendon or muscle are NOT exposed.  Pressure Injury 07/03/21 Sacrum Stage 2 -  Partial thickness loss of dermis presenting as a shallow open injury with a red, pink wound bed without slough.     Foam dressing to above pressure wounds daily and prn.   Flush IV access with Sodium Chloride 0.9% and Heparin 10 units/ml or 100 units/ml   Complete by: As directed    Home infusion instructions - [facility]   Complete by: As directed    Instructions: Flush IV access with Sodium Chloride 0.9% and Heparin 10units/ml or 100units/ml   Change dressing on IV access line: Weekly and PRN   Instructions Cath Flo 2mg: A67mnister for PICC Line occlusion and as ordered by physician for other access device   [facility] pharmacist to adjust dose for: Vancomycin, Aminoglycosides and other anti-infective therapies as requested by physician   Increase activity slowly   Complete by: As directed    Method of administration may be changed at the discretion of home infusion pharmacist based upon assessment of the patient and/or caregiver's ability to self-administer the medication ordered   Complete by: As directed    No wound care   Complete by: As directed    Outpatient Parenteral Antibiotic Therapy Information Antibiotic:  Ceftriaxone (Rocephin) IVPB, Vancomycin IVPB; Indications for use: septic shoulder; End Date: 09/07/2021   Complete by: As directed  Antibiotic:  Ceftriaxone (Rocephin) IVPB Vancomycin IVPB     Indications for use: septic shoulder   End Date: 09/07/2021        Medication List     STOP taking these medications    lovastatin 20 MG tablet Commonly known as: MEVACOR Replaced by: pravastatin 20 MG tablet   polyethylene glycol powder 17 GM/SCOOP powder Commonly known as: GLYCOLAX/MIRALAX   traMADol 50 MG tablet Commonly known as: Ultram   warfarin 5 MG tablet Commonly known as: COUMADIN       TAKE these medications    acetaminophen 325 MG tablet Commonly known as: TYLENOL Take 2 tablets (650 mg total) by mouth every 6 (six) hours as needed for mild pain or moderate pain (or Fever >/= 101).   allopurinol 100 MG tablet Commonly known as: ZYLOPRIM Take 1 tablet (100 mg total) by mouth daily.   apixaban 5 MG Tabs tablet Commonly known as: ELIQUIS Take 1 tablet (5 mg total) by mouth 2 (two) times daily.   carvedilol 6.25 MG tablet Commonly known as: Coreg Take 1 tablet (6.25 mg total) by mouth 2 (two) times daily. What changed:  medication strength how much to take   cefTRIAXone  IVPB Commonly known as: ROCEPHIN Inject 2 g into the vein daily. Indication:  septic shoulder First Dose: No Last Day of Therapy:  09/07/21 Labs - Once weekly:  CBC/D and BMP, Labs - Every other week:  ESR and CRP Method of administration: IV Push Method of administration may be changed at the discretion of home infusion pharmacist based upon assessment of the patient and/or caregiver's ability to self-administer the medication ordered.   diclofenac Sodium 1 % Gel Commonly known as: VOLTAREN APPLY FOUR GRAMS FOUR TIMES DAILY AS NEEDED What changed:  how much to take how to take this when to take this reasons to take this additional instructions   docusate sodium 100 MG  capsule Commonly known as: COLACE Take 1 capsule (100 mg total) by mouth 2 (two) times daily.   exemestane 25 MG tablet Commonly known as: AROMASIN Take 1 tablet (25 mg total) by mouth daily after breakfast.   lactulose 10 GM/15ML solution Commonly known as: CHRONULAC Take 15 mLs (10 g total) by mouth 3 (three) times daily.   levothyroxine 50 MCG tablet Commonly known as: SYNTHROID Take 1 tablet (50 mcg total) by mouth daily before breakfast. Overdue for Annual appt must see provider for future refills   lipase/protease/amylase 12000-38000 units Cpep capsule Commonly known as: CREON Take 1 capsule (12,000 Units total) by mouth daily as needed (stomach problems).   magnesium oxide 400 MG tablet Commonly known as: MAG-OX Take 400 mg by mouth 2 (two) times daily.   methocarbamol 500 MG tablet Commonly known as: Robaxin Take 1 tablet (500 mg total) by mouth every 8 (eight) hours as needed for muscle spasms.   omeprazole 40 MG capsule Commonly known as: PRILOSEC Take 1 capsule (40 mg total) by mouth daily. Overdue for Annual appt must see provider for future refills What changed:  when to take this reasons to take this   OVER THE COUNTER MEDICATION Apply 1 application topically daily as needed (apply to buttocks  as needed for irritaiton). Intensive Skin Care Therapy   oxyCODONE 5 MG immediate release tablet Commonly known as: Oxy IR/ROXICODONE Take 1 pills every 6 hrs as needed for severe pain   polyvinyl alcohol 1.4 % ophthalmic solution Commonly known as: LIQUIFILM TEARS Place 1 drop into both eyes as  needed for dry eyes.   potassium chloride 8 MEQ tablet Commonly known as: KLOR-CON Take 1 tablet (8 mEq total) by mouth daily.   pravastatin 20 MG tablet Commonly known as: PRAVACHOL Take 1 tablet (20 mg total) by mouth daily at 6 PM. Replaces: lovastatin 20 MG tablet   rifaximin 550 MG Tabs tablet Commonly known as: XIFAXAN Take 1 tablet (550 mg total) by mouth 2  (two) times daily.   simethicone 40 MG/0.6ML drops Commonly known as: MYLICON Take 0.3 mLs (20 mg total) by mouth 4 (four) times daily as needed for flatulence.   tamoxifen 20 MG tablet Commonly known as: NOLVADEX Take 1 tablet (20 mg total) by mouth daily. Will take after finishing Exemestane   torsemide 20 MG tablet Commonly known as: DEMADEX Take 1 tablet (20 mg total) by mouth daily. What changed:  how much to take how to take this when to take this additional instructions   vancomycin  IVPB Inject 500 mg into the vein daily. Indication:  septic shoulder First Dose: No Last Day of Therapy:  09/07/21 Labs - Sunday/Monday:  CBC/D, BMP, and vancomycin trough. Labs - Thursday:  BMP and vancomycin trough Labs - Every other week:  ESR and CRP Method of administration:Elastomeric Method of administration may be changed at the discretion of the patient and/or caregiver's ability to self-administer the medication ordered.   VITAMIN B-12 PO Take 2,000 mcg by mouth daily.   Vitamin D3 50 MCG (2000 UT) capsule Take 1 capsule (2,000 Units total) by mouth daily.       Allergies  Allergen Reactions   Chlorhexidine Gluconate Hives   Penicillins Itching    Has patient had a PCN reaction causing immediate rash, facial/tongue/throat swelling, SOB or lightheadedness with hypotension: no Has patient had a PCN reaction causing severe rash involving mucus membranes or skin necrosis: No Has patient had a PCN reaction that required hospitalization: No Has patient had a PCN reaction occurring within the last 10 years: No If all of the above answers are "NO", then may proceed with Cephalosporin use. Tolerated Cephalosporin Date: 03/17/21. Td Ancef 2g 07/26/21      Procedures/Studies: DG Chest 2 View  Result Date: 07/01/2021 CLINICAL DATA:  Fall EXAM: CHEST - 2 VIEW COMPARISON:  03/18/2021 FINDINGS: Cardiomegaly, unchanged from prior exam. Left chest cardiac device with leads in the  right atrium and ventricle. Redemonstrated vascular congestion, with interstitial prominence throughout the lungs, likely interstitial edema. No definite pleural effusion. No acute osseous abnormality. Status post right humeral head replacement. IMPRESSION: Cardiomegaly with pulmonary edema. Electronically Signed   By: Merilyn Baba M.D.   On: 07/01/2021 12:52   DG Shoulder Right  Result Date: 07/07/2021 CLINICAL DATA:  Fall several days ago with persistent shoulder pain EXAM: RIGHT SHOULDER - 2+ VIEW COMPARISON:  06/28/2021 FINDINGS: Right shoulder arthroplasty is noted. Findings suggestive of anterior dislocation of the humeral component from the glenoid is noted. No definitive fracture is seen. Degenerative changes about the acromioclavicular joint are seen. Underlying bony thorax appears within normal limits. IMPRESSION: Changes highly suggestive of anterior dislocation of the humeral prosthesis. CT may be helpful for further evaluation. Electronically Signed   By: Inez Catalina M.D.   On: 07/07/2021 16:39   DG Ankle 2 Views Left  Result Date: 07/01/2021 CLINICAL DATA:  [place] last week twisting the ankle. Lymph edema with swelling of the entire leg. Unable to move leg. Pain. EXAM: LEFT ANKLE - 2 VIEW COMPARISON:  None. FINDINGS: Nonstandard positioning  limits evaluation. Diffuse soft tissue swelling about the visualized left lower extremity. Prominent degenerative changes demonstrated in the left ankle and intertarsal joints. No acute displaced fractures are identified. No bone erosion or bone destruction. Plantar calcaneal spur. Vascular calcifications. IMPRESSION: Prominent degenerative changes in the left foot and ankle. No definite evidence of an acute fracture. Diffuse soft tissue swelling. Electronically Signed   By: Lucienne Capers M.D.   On: 07/01/2021 21:31   CT HEAD WO CONTRAST (5MM)  Result Date: 07/01/2021 CLINICAL DATA:  Altered mental status. EXAM: CT HEAD WITHOUT CONTRAST TECHNIQUE:  Contiguous axial images were obtained from the base of the skull through the vertex without intravenous contrast. COMPARISON:  CT head dated January 05, 2021. FINDINGS: Brain: No evidence of acute infarction, hemorrhage, hydrocephalus, extra-axial collection or mass lesion/mass effect. Stable mild atrophy and chronic microvascular ischemic changes. Vascular: No hyperdense vessel or unexpected calcification. Skull: Normal. Negative for fracture or focal lesion. Sinuses/Orbits: No acute finding. Other: None. IMPRESSION: 1. No acute intracranial abnormality. Electronically Signed   By: Titus Dubin M.D.   On: 07/01/2021 13:01   CT LUMBAR SPINE WO CONTRAST  Result Date: 07/06/2021 CLINICAL DATA:  Osteoarthritis, lumbosacral Spinal stenosis, lumbosacral Low back pain, progressive neurologic deficit EXAM: CT LUMBAR SPINE WITHOUT CONTRAST TECHNIQUE: Multidetector CT imaging of the lumbar spine was performed without intravenous contrast administration. Multiplanar CT image reconstructions were also generated. COMPARISON:  MRI 06/19/2017, CT 01/05/2021. FINDINGS: Segmentation: There are 4 non-rib-bearing lumbar type vertebrae, for the purposes of this dictation, numbering is done consistent with the prior MRI in 2018 and which the most inferior well-formed intervertebral disc is labeled as L5-S1. Alignment: Trace retrolisthesis at L3-L4. Straightening of the lumbar lordosis. Vertebrae: No evidence of acute fracture. There is marked endplate irregularity on the right at L3-L4 and on the left at L5-S1, similar to prior CT and March 2022, and progressed since the prior lumbar spine MRI in August 2018. This is favored to be degenerative endplate change. Paraspinal and other soft tissues: Negative. Disc levels: T12-L1: No significant spinal canal or neural foraminal narrowing. L1-L2: No significant spinal canal or neural foraminal narrowing. L2-L3: Arthropathy mild broad-based disc bulging and bilateral facet arthropathy  results in mild spinal canal stenosis. No significant neural foraminal narrowing. L3-L4: Trace retrolisthesis with broad-based disc bulging and bilateral facet arthropathy, marked right-sided endplate irregularity. Moderate spinal canal stenosis. Mild bilateral neural foraminal narrowing. L4-L5: Severe disc height loss with posterior disc osteophyte complex and bilateral facet arthropathy results and moderate spinal canal stenosis and mild-to-moderate bilateral neural foraminal narrowing. L5-S1: Disc height loss with endplate irregularity on the left, bilateral facet arthropathy and asymmetric left disc bulging results and moderate bilateral neural foraminal narrowing. IMPRESSION: Multilevel degenerative changes of the lumbar spine, worst at L3-L4, L4-5, and L5-S1, as summarized below: L3-L4: Moderate spinal canal stenosis. Mild bilateral neural foraminal narrowing. Trace retrolisthesis and marked right-sided endplate irregularity. L4-L5: Moderate spinal canal stenosis. Mild-to-moderate bilateral neural foraminal narrowing. Severe disc height loss. L5-S1: Moderate bilateral neural foraminal narrowing. Electronically Signed   By: Maurine Simmering M.D.   On: 07/06/2021 15:30   CT PELVIS WO CONTRAST  Result Date: 07/06/2021 CLINICAL DATA:  Low back and bilateral lower extremity pain. EXAM: CT PELVIS WITHOUT CONTRAST TECHNIQUE: Multidetector CT imaging of the pelvis was performed following the standard protocol without intravenous contrast. COMPARISON:  None. FINDINGS: Urinary Tract:  No abnormality visualized. Bowel:  Unremarkable visualized pelvic bowel loops. Vascular/Lymphatic: No pathologically enlarged lymph nodes. No significant vascular abnormality  seen. Reproductive: Small calcified uterine fibroid is noted. No adnexal abnormalities noted. Other:  None. Musculoskeletal: Severe multilevel degenerative disc disease is noted in the visualized lumbar spine. No acute osseous abnormality is noted. No fracture is  noted. Hip and sacroiliac joints are unremarkable. IMPRESSION: Severe multilevel degenerative disc disease is noted in the lower lumbar spine. No fracture or other acute osseous abnormalities noted. Small calcified uterine fibroid. Electronically Signed   By: Marijo Conception M.D.   On: 07/06/2021 14:45   CT KNEE LEFT WO CONTRAST  Result Date: 07/06/2021 CLINICAL DATA:  Severe lymphedema EXAM: CT OF THE left KNEE WITHOUT CONTRAST TECHNIQUE: Multidetector CT imaging of the left knee was performed according to the standard protocol. Multiplanar CT image reconstructions were also generated. COMPARISON:  Knee radiograph 07/01/2021 FINDINGS: Bones/Joint/Cartilage There is no evidence of acute fracture. There is tricompartment osteophyte formation with subchondral sclerosis and cystic change, with severe medial and patellofemoral joint space narrowing. Slight varus alignment. Small joint effusion. 4 mm ossified joint body is present within the suprapatellar joint space (series 1, image 48). Ligaments Suboptimally assessed by CT. Muscles and Tendons Mild generalized muscle atrophy. Soft tissues There is skin thickening and extensive subcutaneous soft tissue swelling. There is no well-defined or focal fluid collection. There is a 2.1 x 1.5 cm partially visualized soft tissue density lesion in the posteromedial thigh (series 1, image 1). IMPRESSION: No evidence of acute fracture. Tricompartment osteoarthritis, severe in the medial and patellofemoral compartments. Small joint effusion with 4 mm ossified joint body within the suprapatellar joint space. Diffuse skin thickening and subcutaneous soft tissue swelling as can be seen in cellulitis or lymphedema. Partially visualized 2.1 x 1.5 cm soft tissue lesion in the posteromedial thigh subcutaneous tissues adjacent to a superficial vein. This could potentially represent a lymph node, however recommend targeted ultrasound for further evaluation. Electronically Signed   By:  Maurine Simmering M.D.   On: 07/06/2021 15:36   CT KNEE RIGHT WO CONTRAST  Result Date: 07/06/2021 CLINICAL DATA:  Lymphedema EXAM: CT OF THE right KNEE WITHOUT CONTRAST TECHNIQUE: Multidetector CT imaging of the right knee was performed according to the standard protocol. Multiplanar CT image reconstructions were also generated. COMPARISON:  None. FINDINGS: Bones/Joint/Cartilage There is no evidence of acute fracture. There is tricompartment osteophyte formation with subchondral cystic change and sclerosis. There is severe joint space narrowing. There is a moderate-sized joint effusion. There is flattening of the medial tibial plateau. There is a osteochondral defect along the inner posterior weight-bearing lateral femoral condyle measuring 9 mm in width (series 6, image 73). Ligaments Suboptimally assessed by CT. Muscles and Tendons Mild generalized muscle atrophy. Soft tissues There is diffuse skin thickening and subcutaneous edema. No focal fluid collection. IMPRESSION: No evidence of acute fracture. Severe tricompartment osteoarthritis of the right knee, with osteochondral defect along the posterior weight-bearing lateral femoral condyle and flattening of the medial tibial plateau. Moderate-sized joint effusion. Diffuse skin thickening and subcutaneous soft tissue swelling, as can be seen with cellulitis or lymphedema. No focal fluid collection. Electronically Signed   By: Maurine Simmering M.D.   On: 07/06/2021 15:40   CT SHOULDER RIGHT W CONTRAST  Result Date: 07/19/2021 CLINICAL DATA:  Fall 1 week ago. Shoulder pain. Concern for septic arthritis. Right upper arm fluid collection aspiration 3 days ago. EXAM: CT OF THE UPPER RIGHT EXTREMITY WITH CONTRAST TECHNIQUE: Multidetector CT imaging of the right shoulder was performed according to the standard protocol following intravenous contrast administration. CONTRAST:  7m  OMNIPAQUE IOHEXOL 350 MG/ML SOLN COMPARISON:  Images during shoulder aspiration 07/16/2021. CT  07/08/2021. FINDINGS: Bones/Joint/Cartilage Status post right total shoulder arthroplasty without evidence of acute fracture, dislocation or hardware loosening. Unchanged anterior inferior subluxation of the humeral head relative to the glenoid with underlying chronic Lynn oil bone loss. No evidence of acute bone destruction. Previously demonstrated fracture involving the base of the acromion is slightly more displaced (6 mm). Stable postsurgical changes of the distal clavicle and acromion. A moderate-sized right shoulder effusion is partly obscured by artifact from the orthopedic hardware, but is grossly unchanged. Ligaments Suboptimally assessed by CT. Muscles and Tendons Chronic atrophy of the supraspinatus, infraspinatus and subscapularis muscles again noted. Soft tissues The previously demonstrated fluid collection anterior to the right shoulder appears slightly smaller, measuring approximately 3.8 x 1.9 cm on image 44/7 (previously 7.7 x 2.7 cm). A small amount of gas is present in this collection, decreased from the previous study. No enlarging periarticular fluid collections are identified. Right lung interstitial prominence and a cardiac pacemaker are noted. IMPRESSION: 1. The previously aspirated fluid collection anterior to the right shoulder has decreased in size. No new or enlarging fluid collections identified. 2. The moderate-sized joint effusion is grossly unchanged in volume, suboptimally visualized due to artifact from the shoulder arthroplasty. 3. Interval mildly increased displacement of the fracture involving the base of the acromion. No acute osseous findings or specific evidence of osteomyelitis. 4. Chronic severe atrophy of the right shoulder rotator cuff musculature with narrowing of the subacromial space and anterior inferior subluxation of the humeral head. Electronically Signed   By: Richardean Sale M.D.   On: 07/19/2021 18:04   CT SHOULDER RIGHT WO CONTRAST  Result Date:  07/08/2021 CLINICAL DATA:  Shoulder pain, traumatic (Ped 0-18y) anterior shoulder dislocation EXAM: CT OF THE UPPER RIGHT EXTREMITY WITHOUT CONTRAST TECHNIQUE: Multidetector CT imaging of the upper right extremity was performed according to the standard protocol. COMPARISON:  Right shoulder radiograph 07/07/2021, chest radiograph 07/01/2021, shoulder radiograph 06/28/2021, CT 02/17/2021 FINDINGS: Bones/Joint/Cartilage Unchanged acromion fracture and thinning of the undersurface of the acromion and distal clavicle. Anatomic right shoulder arthroplasty, which appears anterior inferiorly subluxed. There is glenoid bone loss with retroversion. There is a joint effusion, and subacromial-subdeltoid bursal distension. Ligaments Suboptimally assessed by CT. Muscles and Tendons Severe supraspinatus and infraspinatus muscle atrophy. Moderate subscapularis muscle atrophy. Soft tissues There is a fluid collection containing gas along the lateral proximal humerus measuring approximately 7.7 x 2.7 cm. This may connect with the bursa/glenohumeral joint. IMPRESSION: Anterior inferior subluxation of the anatomic shoulder arthroplasty with some loss of glenoid bone stock. Glenohumeral joint effusion and subacromial-subdeltoid bursal distension, with measurable fluid collection containing gas along the lateral aspect of the proximal humerus measuring 7.7 x 2.7 cm. This collection may connect with the bursa/joint. Unchanged acromion fracture and chronic thinning of the acromion and distal clavicle. Severe supraspinatus and infraspinatus muscle atrophy. Moderate subscapularis muscle atrophy. Electronically Signed   By: Maurine Simmering M.D.   On: 07/08/2021 14:24   IR Fluoro Guide CV Line Left  Result Date: 07/29/2021 INDICATION: 75 year old female with septic right shoulder. She requires durable venous access for long-term antibiotic therapy. EXAM: IR LEFT FLUORO GUIDE CV LINE MEDICATIONS: None ANESTHESIA/SEDATION: Moderate (conscious)  sedation was employed during this procedure. A total of Versed 2 mg and Fentanyl 100 mcg was administered intravenously. Moderate Sedation Time: 14 minutes. The patient's level of consciousness and vital signs were monitored continuously by radiology nursing throughout the procedure under my  direct supervision. FLUOROSCOPY TIME:  Fluoroscopy Time: 0 minutes 30 seconds (2 mGy). COMPLICATIONS: None immediate. PROCEDURE: Informed written consent was obtained from the patient after a thorough discussion of the procedural risks, benefits and alternatives. All questions were addressed. Maximal Sterile Barrier Technique was utilized including caps, mask, sterile gowns, sterile gloves, sterile drape, hand hygiene and skin antiseptic. A timeout was performed prior to the initiation of the procedure. The left internal jugular vein was interrogated with ultrasound and found to be widely patent. An image was obtained and stored for the medical record. Local anesthesia was attained by infiltration with 1% lidocaine. A small dermatotomy was made. Under real-time sonographic guidance, the vessel was punctured with a 21 gauge micropuncture needle. Using standard technique, the initial micro needle was exchanged over a 0.018 micro wire for a peel-away sheath. A single-lumen power injectable tunneled central line was then cut to length. A skin exit site inferior to the clavicle was selected. Local anesthesia was attained by infiltration with 1% lidocaine. A small dermatotomy was made. The catheter was tunneled from the skin exit site to the dermatotomy overlying the venous access site. The catheter was advanced through the peel-away sheath and positioned under fluoroscopy in the upper right atrium. The catheter flushes and aspirates easily. The catheter was flushed and capped. The catheter was secured to the skin with 0 Prolene suture. An image was obtained and stored for the medical record. The dermatotomy at the venous access site  was sealed with Dermabond. Sterile bandages were placed. IMPRESSION: Successful placement of left IJ single-lumen tunneled central power line. Catheter tip is in the upper right atrium and the device is ready for immediate use. Electronically Signed   By: Jacqulynn Cadet M.D.   On: 07/29/2021 15:07   IR US Guide Bx Asp/Drain  Result Date: 07/16/2021 INDICATION: Status post right shoulder hemiarthroplasty on 03/17/2021. Development of complex fluid collection of the anterior right upper arm adjacent to the humerus which may communicate with the glenohumeral joint by CT. Request made for sampling and drainage of the fluid collection and shoulder joint. EXAM: 1. ULTRASOUND-GUIDED ASPIRATION ABSCESS/FLUID COLLECTION OF THE RIGHT UPPER EXTREMITY SOFT TISSUES 2. ASPIRATION OF THE RIGHT GLENOHUMERAL JOINT UNDER FLUOROSCOPY MEDICATIONS: None ANESTHESIA/SEDATION: Fentanyl 100 mcg IV; Versed 2.0 mg IV Moderate Sedation Time:  22 minutes The patient was continuously monitored during the procedure by the interventional radiology nurse under my direct supervision. FLUOROSCOPY TIME:  36 seconds.  11.0 mGy. COMPLICATIONS: None immediate. PROCEDURE: Informed written consent was obtained from the patient and her son after a thorough discussion of the procedural risks, benefits and alternatives. All questions were addressed. Maximal Sterile Barrier Technique was utilized including caps, mask, sterile gowns, sterile gloves, sterile drape, hand hygiene and skin antiseptic. A timeout was performed prior to the initiation of the procedure. Under ultrasound guidance, an 18 gauge trocar needle was advanced to the level of a fluid collection in the right upper arm. Aspiration was performed in several different locations and a fluid sample sent for culture analysis. Under fluoroscopy, an 18 gauge spinal needle was advanced to the level of the glenohumeral joint. Aspiration was performed in 2 different locations. A fluid sample was sent  for culture analysis. FINDINGS: Elongated and serpiginous complex fluid collection of the right upper arm approaches the skin. Aspiration at the level of the deeper component of this collection yielded a tiny amount of debris and no free fluid. Some of this debris appeared purulent but was extremely thick in nature  and difficult to aspirate. As the needle was moved into a more superficial location, additional bloody fluid was also able to be aspirated. In total, approximately 5 mL of bloody fluid mixed with purulent appearing debris was able to be aspirated and sent for culture analysis. Due to nature of the fluid, placement of a small caliber drain was not indicated and would likely not drain well. Aspiration at the level of the glenohumeral joint after hemiarthroplasty yielded only approximately 2 mL of bloody fluid. This was mixed with a small amount of sterile saline and sent for culture analysis. IMPRESSION: 1. Ultrasound-guided aspiration at the level of a complex fluid collection in the right upper arm yielded bloody fluid and debris. Deeper component of this fluid collection yielded almost no fluid likely related to very thick internal contents. The more superficial component yielded bloody fluid. A total sample of 5 mL was able to be aspirated and sent for culture analysis. 2. Separate fluoroscopic guided aspiration at the level of the glenohumeral joint after prior hemiarthroplasty yielded approximately 2 mL of bloody fluid without gross purulence. This sample was sent separately for culture analysis. Electronically Signed   By: Aletta Edouard M.D.   On: 07/16/2021 17:55   DG Shoulder Right Port  Result Date: 07/26/2021 CLINICAL DATA:  Postop right shoulder. EXAM: PORTABLE RIGHT SHOULDER COMPARISON:  Shoulder CT 07/19/2021 FINDINGS: Previous right shoulder arthroplasty has been removed. Surgical drain is in place. Chronic changes about the acromioclavicular joint. Previously demonstrated fracture  through the base of the acromion is grossly similar common but not well assessed on this portable view. IMPRESSION: 1. Explantation of right shoulder arthroplasty. Surgical drain in place. 2. Previously demonstrated fracture through the base of the acromion was better assessed on prior CT. Electronically Signed   By: Keith Rake M.D.   On: 07/26/2021 16:58   DG Knee Complete 4 Views Left  Result Date: 07/01/2021 CLINICAL DATA:  Fall. EXAM: RIGHT KNEE - COMPLETE 4+ VIEW; LEFT KNEE - COMPLETE 4+ VIEW COMPARISON:  None. FINDINGS: Left knee: No acute fracture or malalignment. No joint effusion. Tricompartmental joint space narrowing, moderate to severe in the medial and patellofemoral compartments, with large bulky marginal osteophytes. Soft tissues are unremarkable. Right knee: No acute fracture or malalignment. Small joint effusion. Severe tricompartmental joint space narrowing with bone-on-bone apposition in the medial compartment. Bulky marginal osteophytes. Soft tissues are unremarkable. IMPRESSION: 1. No acute osseous abnormality. 2. Severe right and moderate to severe left knee osteoarthritis. Electronically Signed   By: Titus Dubin M.D.   On: 07/01/2021 12:48   DG Knee Complete 4 Views Right  Result Date: 07/01/2021 CLINICAL DATA:  Fall. EXAM: RIGHT KNEE - COMPLETE 4+ VIEW; LEFT KNEE - COMPLETE 4+ VIEW COMPARISON:  None. FINDINGS: Left knee: No acute fracture or malalignment. No joint effusion. Tricompartmental joint space narrowing, moderate to severe in the medial and patellofemoral compartments, with large bulky marginal osteophytes. Soft tissues are unremarkable. Right knee: No acute fracture or malalignment. Small joint effusion. Severe tricompartmental joint space narrowing with bone-on-bone apposition in the medial compartment. Bulky marginal osteophytes. Soft tissues are unremarkable. IMPRESSION: 1. No acute osseous abnormality. 2. Severe right and moderate to severe left knee  osteoarthritis. Electronically Signed   By: Titus Dubin M.D.   On: 07/01/2021 12:48   IR DRAIN/INJ MAJOR JOINT/BURSA  Result Date: 07/16/2021 INDICATION: Status post right shoulder hemiarthroplasty on 03/17/2021. Development of complex fluid collection of the anterior right upper arm adjacent to the humerus which may  communicate with the glenohumeral joint by CT. Request made for sampling and drainage of the fluid collection and shoulder joint. EXAM: 1. ULTRASOUND-GUIDED ASPIRATION ABSCESS/FLUID COLLECTION OF THE RIGHT UPPER EXTREMITY SOFT TISSUES 2. ASPIRATION OF THE RIGHT GLENOHUMERAL JOINT UNDER FLUOROSCOPY MEDICATIONS: None ANESTHESIA/SEDATION: Fentanyl 100 mcg IV; Versed 2.0 mg IV Moderate Sedation Time:  22 minutes The patient was continuously monitored during the procedure by the interventional radiology nurse under my direct supervision. FLUOROSCOPY TIME:  36 seconds.  11.0 mGy. COMPLICATIONS: None immediate. PROCEDURE: Informed written consent was obtained from the patient and her son after a thorough discussion of the procedural risks, benefits and alternatives. All questions were addressed. Maximal Sterile Barrier Technique was utilized including caps, mask, sterile gowns, sterile gloves, sterile drape, hand hygiene and skin antiseptic. A timeout was performed prior to the initiation of the procedure. Under ultrasound guidance, an 18 gauge trocar needle was advanced to the level of a fluid collection in the right upper arm. Aspiration was performed in several different locations and a fluid sample sent for culture analysis. Under fluoroscopy, an 18 gauge spinal needle was advanced to the level of the glenohumeral joint. Aspiration was performed in 2 different locations. A fluid sample was sent for culture analysis. FINDINGS: Elongated and serpiginous complex fluid collection of the right upper arm approaches the skin. Aspiration at the level of the deeper component of this collection yielded a tiny  amount of debris and no free fluid. Some of this debris appeared purulent but was extremely thick in nature and difficult to aspirate. As the needle was moved into a more superficial location, additional bloody fluid was also able to be aspirated. In total, approximately 5 mL of bloody fluid mixed with purulent appearing debris was able to be aspirated and sent for culture analysis. Due to nature of the fluid, placement of a small caliber drain was not indicated and would likely not drain well. Aspiration at the level of the glenohumeral joint after hemiarthroplasty yielded only approximately 2 mL of bloody fluid. This was mixed with a small amount of sterile saline and sent for culture analysis. IMPRESSION: 1. Ultrasound-guided aspiration at the level of a complex fluid collection in the right upper arm yielded bloody fluid and debris. Deeper component of this fluid collection yielded almost no fluid likely related to very thick internal contents. The more superficial component yielded bloody fluid. A total sample of 5 mL was able to be aspirated and sent for culture analysis. 2. Separate fluoroscopic guided aspiration at the level of the glenohumeral joint after prior hemiarthroplasty yielded approximately 2 mL of bloody fluid without gross purulence. This sample was sent separately for culture analysis. Electronically Signed   By: Aletta Edouard M.D.   On: 07/16/2021 17:55   VAS [place] LOWER EXTREMITY VENOUS (DVT)  Result Date: 07/20/2021  Lower Venous DVT Study Patient Name:  Katelyn Jackson  Date of Exam:   07/20/2021 Medical Rec #: [id number]          Accession #:    [id number] Date of Birth: 1946-04-15          Patient Gender: F Patient Age:   40 years Exam Location:  [facility] Procedure:      VAS [place] LOWER EXTREMITY VENOUS (DVT) Referring Phys: [name] --------------------------------------------------------------------------------  Indications: Pain.  Risk Factors: None identified.  Limitations: Body habitus and poor ultrasound/tissue interface. Comparison Study: No prior studies. Performing Technologist: Oliver Hum RVT  Examination Guidelines: A complete evaluation includes B-mode imaging, spectral  Doppler, color Doppler, and power Doppler as needed of all accessible portions of each vessel. Bilateral testing is considered an integral part of a complete examination. Limited examinations for reoccurring indications may be performed as noted. The reflux portion of the exam is performed with the patient in reverse Trendelenburg.  +---------+---------------+---------+-----------+----------+-------------------+ RIGHT    CompressibilityPhasicitySpontaneityPropertiesThrombus Aging      +---------+---------------+---------+-----------+----------+-------------------+ CFV      Full           Yes      Yes                                      +---------+---------------+---------+-----------+----------+-------------------+ SFJ      Full                                                             +---------+---------------+---------+-----------+----------+-------------------+ FV Prox  Full                                                             +---------+---------------+---------+-----------+----------+-------------------+ FV Mid   Full                                                             +---------+---------------+---------+-----------+----------+-------------------+ FV Distal               Yes      Yes                                      +---------+---------------+---------+-----------+----------+-------------------+ PFV      Full                                                             +---------+---------------+---------+-----------+----------+-------------------+ POP      Full           Yes      Yes                                      +---------+---------------+---------+-----------+----------+-------------------+ PTV       Full                                                             +---------+---------------+---------+-----------+----------+-------------------+ PERO  Not well visualized +---------+---------------+---------+-----------+----------+-------------------+   +---------+---------------+---------+-----------+----------+--------------+ LEFT     CompressibilityPhasicitySpontaneityPropertiesThrombus Aging +---------+---------------+---------+-----------+----------+--------------+ CFV      Full           Yes      Yes                                 +---------+---------------+---------+-----------+----------+--------------+ SFJ      Full                                                        +---------+---------------+---------+-----------+----------+--------------+ FV Prox  Full                                                        +---------+---------------+---------+-----------+----------+--------------+ FV Mid   Full                                                        +---------+---------------+---------+-----------+----------+--------------+ FV Distal               Yes      Yes                                 +---------+---------------+---------+-----------+----------+--------------+ PFV      Full                                                        +---------+---------------+---------+-----------+----------+--------------+ POP      Full           Yes      Yes                                 +---------+---------------+---------+-----------+----------+--------------+ PTV      Full                                                        +---------+---------------+---------+-----------+----------+--------------+ PERO     Full                                                        +---------+---------------+---------+-----------+----------+--------------+     Summary: RIGHT: - There is no  evidence of deep vein thrombosis in the lower extremity. However, portions of this examination were limited- see technologist comments above.  - No cystic structure found in the popliteal fossa.  LEFT: -  There is no evidence of deep vein thrombosis in the lower extremity. However, portions of this examination were limited- see technologist comments above.  - No cystic structure found in the popliteal fossa.  *See table(s) above for measurements and observations. Electronically signed by Harold Barban MD on 07/20/2021 at 7:12:23 PM.    Final    VAS [place] UPPER EXTREMITY VENOUS DUPLEX  Result Date: 07/07/2021 UPPER VENOUS STUDY  Patient Name:  HANIA CERONE  Date of Exam:   07/07/2021 Medical Rec #: [id number]          Accession #:    [id number] Date of Birth: Mar 14, 1946          Patient Gender: F Patient Age:   69 years Exam Location:  [facility] Procedure:      VAS [place] UPPER EXTREMITY VENOUS DUPLEX Referring Phys: Shawna Clamp --------------------------------------------------------------------------------  Indications: Edema, and Erythema Limitations: Body habitus, poor ultrasound/tissue interface and patient position. Comparison Study: No prior study Performing Technologist: Maudry Mayhew MHA, RDMS, RVT, RDCS  Examination Guidelines: A complete evaluation includes B-mode imaging, spectral Doppler, color Doppler, and power Doppler as needed of all accessible portions of each vessel. Bilateral testing is considered an integral part of a complete examination. Limited examinations for reoccurring indications may be performed as noted.  Right Findings: +----------+------------+---------+-----------+----------+--------------+ RIGHT     CompressiblePhasicitySpontaneousProperties   Summary     +----------+------------+---------+-----------+----------+--------------+ IJV           Full       Yes       Yes                              +----------+------------+---------+-----------+----------+--------------+ Subclavian    Full       Yes       Yes                             +----------+------------+---------+-----------+----------+--------------+ Axillary      Full       Yes       Yes                             +----------+------------+---------+-----------+----------+--------------+ Brachial      Full       Yes       Yes                             +----------+------------+---------+-----------+----------+--------------+ Radial        Full                                                 +----------+------------+---------+-----------+----------+--------------+ Ulnar                                               Not visualized +----------+------------+---------+-----------+----------+--------------+ Cephalic      Full                                                 +----------+------------+---------+-----------+----------+--------------+  Basilic       Full                                                 +----------+------------+---------+-----------+----------+--------------+  Summary:  Right: No evidence of deep vein thrombosis in the upper extremity. However, unable to visualize the ulnar veins.  *See table(s) above for measurements and observations.  Diagnosing physician: Harold Barban MD Electronically signed by Harold Barban MD on 07/07/2021 at 6:32:26 PM.    Final    [place] EKG SITE RITE  Result Date: 07/28/2021 If Site Rite image not attached, placement could not be confirmed due to current cardiac rhythm.  US Abdomen Limited RUQ (LIVER/GB)  Result Date: 07/21/2021 CLINICAL DATA:  Elevated INR, obesity EXAM: ULTRASOUND ABDOMEN LIMITED RIGHT UPPER QUADRANT COMPARISON:  01/05/2021 FINDINGS: Gallbladder: Echogenic material within the gallbladder compatible with sludge. No shadowing gallstones. No gallbladder wall thickening or pericholecystic fluid. Negative Murphy sign. Common bile duct:  Diameter: 7 mm Liver: Coarsened liver echotexture with nodularity of the liver capsule compatible with cirrhosis. No focal parenchymal abnormalities. No intrahepatic duct dilation. Portal vein is patent on color Doppler imaging with normal direction of blood flow towards the liver. Other: None. IMPRESSION: 1. Gallbladder sludge. No evidence of cholelithiasis or cholecystitis. 2. Sonographic findings of cirrhosis. Electronically Signed   By: Randa Ngo M.D.   On: 07/21/2021 19:54      Subjective: Interviewed and examined using the video interpreter.  Very minimal right shoulder pain.  Ongoing right hip to knee pain since fall, intermittent, worse with weightbearing.  Discharge Exam:  Vitals:   07/29/21 1530 07/29/21 2244 07/30/21 0605 07/30/21 1421  BP: (!) 146/60 (!) 119/57 122/63 124/61  Pulse: 61 (!) 58 60 62  Resp: _0 Temp: 97.9 F (36.6 C) 98.4 F (36.9 C) 98.3 F (36.8 C) 98.8 F (37.1 C)  TempSrc: Oral Oral Oral Oral  SpO2: 98% 95% 96% 97%  Weight:      Height:        General exam: Elderly female, moderately built and morbidly obese lying comfortably propped up in bed without distress. Respiratory system: Clear to auscultation.  No increased work of breathing. Cardiovascular system: S1 and S2 heard, irregularly irregular.  No JVD.  Trace bilateral ankle edema.  Telemetry personally reviewed: A. fib with V paced rhythm. Gastrointestinal system: Abdomen is nondistended, soft and nontender. No organomegaly or masses felt. Normal bowel sounds heard. Central nervous system: Alert and oriented. No focal neurological deficits. Extremities: Symmetric 5 x 5 power.  Right upper extremity in sling.  Dressing clean and dry.  Wound VAC has been removed. Skin: No rashes, lesions or ulcers Psychiatry: Judgement and insight appear normal. Mood & affect appropriate.     The results of significant diagnostics from this hospitalization (including imaging, microbiology, ancillary  and laboratory) are listed below for reference.     Microbiology: Recent Results (from the past 240 hour(s))  Surgical PCR screen     Status: None   Collection Time: 07/25/21  5:49 PM   Specimen: Nasal Mucosa; Nasal Swab  Result Value Ref Range Status   MRSA, PCR NEGATIVE NEGATIVE Final   Staphylococcus aureus NEGATIVE NEGATIVE Final    Comment: (NOTE) The Xpert SA Assay (FDA approved for NASAL specimens in patients 59 years of age and older), is one component of a  comprehensive surveillance program. It is not intended to diagnose infection nor to guide or monitor treatment. Performed at [facility], [place] [address] [place], [place] [address]   Aerobic/Anaerobic Culture w Gram Stain (surgical/deep wound)     Status: None (Preliminary result)   Collection Time: 07/26/21  2:37 PM   Specimen: Soft Tissue, Other  Result Value Ref Range Status   Specimen Description   Final    TISSUE SHOULDER RIGHT 1 Performed at [facility] [address] [place], [place] [address]    Special Requests   Final    NONE Performed at [facility], [place] [address] [place], [place] [address]    Gram Stain   Final    FEW WBC PRESENT, PREDOMINANTLY PMN NO ORGANISMS SEEN    Culture   Final    NO GROWTH 4 DAYS CONTINUING TO HOLD Performed at [facility], [place] [address] [place], [place] [address]    Report Status PENDING  Incomplete  Aerobic/Anaerobic Culture w Gram Stain (surgical/deep wound)     Status: None (Preliminary result)   Collection Time: 07/26/21  2:38 PM   Specimen: Soft Tissue, Other  Result Value Ref Range Status   Specimen Description   Final    TISSUE SHOULDER RIGHT 2 Performed at [facility] [address], [place], [place] [address]    Special Requests   Final    NONE Performed at [facility], [place] [address], [place], [place] [address]    Gram Stain   Final    FEW WBC  PRESENT, PREDOMINANTLY PMN NO ORGANISMS SEEN    Culture   Final    NO GROWTH 4 DAYS CONTINUING TO HOLD Performed at [facility], [place] [address], [place], [place] [address]    Report Status PENDING  Incomplete  Resp Panel by RT-PCR (Flu A&B, Covid) Nasopharyngeal Swab     Status: None   Collection Time: 07/29/21  5:14 PM   Specimen: Nasopharyngeal Swab; Nasopharyngeal(NP) swabs in vial transport medium  Result Value Ref Range Status   SARS Coronavirus 2 by RT PCR NEGATIVE NEGATIVE Final    Comment: (NOTE) SARS-CoV-2 target nucleic acids are NOT DETECTED.  The SARS-CoV-2 RNA is generally detectable in upper respiratory specimens during the acute phase of infection. The lowest concentration of SARS-CoV-2 viral copies this assay can detect is 138 copies/mL. A negative result does not preclude SARS-Cov-2 infection and should not be used as the sole basis for treatment or other patient management decisions. A negative result may occur with  improper specimen collection/handling, submission of specimen other than nasopharyngeal swab, presence of viral mutation(s) within the areas targeted by this assay, and inadequate number of viral copies(<138 copies/mL). A negative result must be combined with clinical observations, patient history, and epidemiological information. The expected result is Negative.  Fact Sheet for Patients:  EntrepreneurPulse.com.au  Fact Sheet for Healthcare Providers:  IncredibleEmployment.be  This test is no t yet approved or cleared by the [place] FDA and  has been authorized for detection and/or diagnosis of SARS-CoV-2 by FDA under an Emergency Use Authorization (EUA). This EUA will remain  in effect (meaning this test can be used) for the duration of the COVID-19 declaration under Section 564(b)(1) of the Act, 21 U.S.C.section 360bbb-3(b)(1), unless the authorization is terminated  or revoked sooner.        Influenza A by PCR NEGATIVE NEGATIVE Final   Influenza B by  PCR NEGATIVE NEGATIVE Final    Comment: (NOTE) The Xpert Xpress SARS-CoV-2/FLU/RSV plus assay is intended as an aid in the diagnosis of influenza from Nasopharyngeal swab specimens and should not be used as a sole basis for treatment. Nasal washings and aspirates are unacceptable for Xpert Xpress SARS-CoV-2/FLU/RSV testing.  Fact Sheet for Patients: EntrepreneurPulse.com.au  Fact Sheet for Healthcare Providers: IncredibleEmployment.be  This test is not yet approved or cleared by the [place] FDA and has been authorized for detection and/or diagnosis of SARS-CoV-2 by FDA under an Emergency Use Authorization (EUA). This EUA will remain in effect (meaning this test can be used) for the duration of the COVID-19 declaration under Section 564(b)(1) of the Act, 21 U.S.C. section 360bbb-3(b)(1), unless the authorization is terminated or revoked.  Performed at [facility], [place] [address] [place], [place] [address]      Labs: CBC: Recent Labs  Lab 07/26/21 0421 07/27/21 0449 07/28/21 0413 07/29/21 0446 07/30/21 0430  WBC 4.9 5.2 7.0 5.7 5.1  NEUTROABS 2.6 4.0 5.2 3.4 3.4  HGB 10.1* 9.6* 9.2* 8.7* 8.2*  HCT 32.9* 31.5* 30.4* 28.4* 27.2*  MCV 97.1 95.7 97.4 96.9 99.3  PLT 201 194 167 149* 133*    Basic Metabolic Panel: Recent Labs  Lab 07/24/21 0517 07/25/21 0525 07/26/21 0421 07/27/21 0449 07/28/21 0413 07/29/21 0446 07/30/21 0430  NA 140 137 141 135 135  --  134*  K 3.5 3.9 3.7 4.8 4.1  --  3.8  CL 96* 94* 100 95* 99  --  98  CO2 34* 31 34* 29 29  --  29  GLUCOSE 81 95 88 138* 111*  --  86  BUN 32* 24* _0 --  17  CREATININE 1.55* 1.51* 1.43* 1.25* 1.23* 1.31* 1.05*  CALCIUM 10.4* 10.3 10.9* 9.9 10.2  --  9.6  MG 2.6* 2.5* 2.4 2.3 2.4  --   --     Liver Function Tests: Recent Labs  Lab 07/24/21 0517 07/25/21 0525 07/26/21 0421  07/27/21 0449  AST _1 ALT _2 ALKPHOS 273* 273* 245* 218*  BILITOT 2.1* 2.0* 2.2* 1.7*  PROT 6.5 6.5 6.3* 6.0*  ALBUMIN 2.5* 2.5* 2.3* 2.3*      Urinalysis    Component Value Date/Time   COLORURINE YELLOW 07/15/2021 2300   APPEARANCEUR CLEAR 07/15/2021 2300   LABSPEC <1.005 (L) 07/15/2021 2300   PHURINE 6.5 07/15/2021 2300   GLUCOSEU NEGATIVE 07/15/2021 2300   GLUCOSEU NEGATIVE 07/11/2017 1108   HGBUR NEGATIVE 07/15/2021 2300   BILIRUBINUR NEGATIVE 07/15/2021 2300   KETONESUR NEGATIVE 07/15/2021 2300   PROTEINUR NEGATIVE 07/15/2021 2300   UROBILINOGEN 0.2 07/11/2017 1108   NITRITE NEGATIVE 07/15/2021 2300   LEUKOCYTESUR NEGATIVE 07/15/2021 2300      Time coordinating discharge: 45 minutes  SIGNED:  Vernell Leep, MD, FACP, [facility]. [facility]  To contact the attending provider between 7A-7P or the covering provider during after hours 7P-7A, please log into the web site [email or web address] and access using universal  password for that web site. If you do not have the password, please call the hospital operator.

## 2021-07-30 NOTE — TOC Transition Note (Addendum)
Transition of Care [facility]) - CM/SW Discharge Note   Patient Details  Name: Katelyn Jackson MRN: [id number] Date of Birth: 1945-12-28  Transition of Care [facility]) CM/SW Contact:  Dessa Phi, RN Phone Number: 07/30/2021, 12:19 PM   Clinical Narrative: [facility] has bed rep Juliann Pulse aware of d/c-will send d/c summary by 2p-MD notified. Await rm#,tel# for nsg report.[facility] for transport. 2p-d/c summary sent-going to rm#[phone],nsg call report tel#[phone]. [facility] to be called when ready.No further CM needs.    Final next level of care: Skilled Nursing Facility Barriers to Discharge: No Barriers Identified   Patient Goals and CMS Choice Patient states their goals for this hospitalization and ongoing recovery are:: Family wants SNF placement.   Choice offered to / list presented to : Adult Children  Discharge Placement PASRR number recieved: 07/26/21            Patient [facility] bed at: [facility] Patient to be transferred to facility by: [place] Name of family member notified: [name] Patient and family notified of of transfer: 07/30/21  Discharge Plan and Services                                     Social Determinants of Health (SDOH) Interventions     Readmission Risk Interventions Readmission Risk Prevention Plan 07/06/2021 03/19/2021  Transportation Screening Complete Complete  HRI or [place] - Complete  Social Work Consult for [place] Planning/Counseling - Complete  Palliative Care Screening - Not Applicable  Medication Review Press photographer) - Complete  HRI or Home Care Consult Complete -  SW Recovery Care/Counseling Consult Complete -  Palliative Care Screening Not Applicable -  Skilled Nursing Facility Complete -  Some recent data might be hidden

## 2021-07-30 NOTE — Progress Notes (Signed)
PHARMACY CONSULT NOTE FOR:  vancomycin and ceftriaxone  OUTPATIENT  PARENTERAL ANTIBIOTIC THERAPY (OPAT)  Indication: septic shoulder Regimen: ceftriaxone 2g IV q24h and vancomycin 500mg  q24h End date: 09/07/21  IV antibiotic discharge orders are pended. To discharging provider:  please sign these orders via discharge navigator,  Select New Orders & click on the button choice - Manage This Unsigned Work.     Thank you for allowing pharmacy to be a part of this patient's care.  Candie Mile 07/30/2021, 12:50 PM

## 2021-08-03 ENCOUNTER — Ambulatory Visit: Payer: Medicare HMO | Admitting: Podiatry

## 2021-08-03 DIAGNOSIS — R42 Dizziness and giddiness: Secondary | ICD-10-CM | POA: Diagnosis not present

## 2021-08-03 DIAGNOSIS — R5381 Other malaise: Secondary | ICD-10-CM | POA: Diagnosis not present

## 2021-08-03 DIAGNOSIS — R262 Difficulty in walking, not elsewhere classified: Secondary | ICD-10-CM | POA: Diagnosis not present

## 2021-08-03 DIAGNOSIS — M6281 Muscle weakness (generalized): Secondary | ICD-10-CM | POA: Diagnosis not present

## 2021-08-03 DIAGNOSIS — M25511 Pain in right shoulder: Secondary | ICD-10-CM | POA: Diagnosis not present

## 2021-08-05 DIAGNOSIS — L02413 Cutaneous abscess of right upper limb: Secondary | ICD-10-CM | POA: Diagnosis not present

## 2021-08-05 DIAGNOSIS — N179 Acute kidney failure, unspecified: Secondary | ICD-10-CM | POA: Diagnosis not present

## 2021-08-05 DIAGNOSIS — I4821 Permanent atrial fibrillation: Secondary | ICD-10-CM | POA: Diagnosis not present

## 2021-08-06 ENCOUNTER — Ambulatory Visit: Payer: Medicare HMO | Admitting: Podiatry

## 2021-08-11 ENCOUNTER — Ambulatory Visit: Payer: Medicare HMO | Admitting: Internal Medicine

## 2021-08-11 DIAGNOSIS — M6281 Muscle weakness (generalized): Secondary | ICD-10-CM | POA: Diagnosis not present

## 2021-08-11 DIAGNOSIS — M25511 Pain in right shoulder: Secondary | ICD-10-CM | POA: Diagnosis not present

## 2021-08-11 DIAGNOSIS — R262 Difficulty in walking, not elsewhere classified: Secondary | ICD-10-CM | POA: Diagnosis not present

## 2021-08-11 DIAGNOSIS — R5381 Other malaise: Secondary | ICD-10-CM | POA: Diagnosis not present

## 2021-08-16 DIAGNOSIS — L02413 Cutaneous abscess of right upper limb: Secondary | ICD-10-CM | POA: Diagnosis not present

## 2021-08-16 DIAGNOSIS — Z950 Presence of cardiac pacemaker: Secondary | ICD-10-CM | POA: Diagnosis not present

## 2021-08-16 DIAGNOSIS — R531 Weakness: Secondary | ICD-10-CM | POA: Diagnosis not present

## 2021-08-17 DIAGNOSIS — R5381 Other malaise: Secondary | ICD-10-CM | POA: Diagnosis not present

## 2021-08-17 DIAGNOSIS — M6281 Muscle weakness (generalized): Secondary | ICD-10-CM | POA: Diagnosis not present

## 2021-08-17 DIAGNOSIS — R262 Difficulty in walking, not elsewhere classified: Secondary | ICD-10-CM | POA: Diagnosis not present

## 2021-08-17 DIAGNOSIS — M25511 Pain in right shoulder: Secondary | ICD-10-CM | POA: Diagnosis not present

## 2021-08-17 LAB — AEROBIC/ANAEROBIC CULTURE W GRAM STAIN (SURGICAL/DEEP WOUND)
Culture: NO GROWTH
Culture: NO GROWTH

## 2021-08-19 ENCOUNTER — Ambulatory Visit (INDEPENDENT_AMBULATORY_CARE_PROVIDER_SITE_OTHER): Payer: Medicare HMO | Admitting: Infectious Disease

## 2021-08-19 ENCOUNTER — Encounter: Payer: Self-pay | Admitting: Infectious Disease

## 2021-08-19 ENCOUNTER — Other Ambulatory Visit: Payer: Self-pay

## 2021-08-19 VITALS — BP 138/80 | HR 81 | Temp 99.9°F

## 2021-08-19 DIAGNOSIS — Z170 Estrogen receptor positive status [ER+]: Secondary | ICD-10-CM

## 2021-08-19 DIAGNOSIS — I890 Lymphedema, not elsewhere classified: Secondary | ICD-10-CM

## 2021-08-19 DIAGNOSIS — T8450XD Infection and inflammatory reaction due to unspecified internal joint prosthesis, subsequent encounter: Secondary | ICD-10-CM

## 2021-08-19 DIAGNOSIS — C50412 Malignant neoplasm of upper-outer quadrant of left female breast: Secondary | ICD-10-CM

## 2021-08-19 DIAGNOSIS — B001 Herpesviral vesicular dermatitis: Secondary | ICD-10-CM | POA: Diagnosis not present

## 2021-08-19 DIAGNOSIS — I482 Chronic atrial fibrillation, unspecified: Secondary | ICD-10-CM

## 2021-08-19 DIAGNOSIS — M175 Other unilateral secondary osteoarthritis of knee: Secondary | ICD-10-CM

## 2021-08-19 DIAGNOSIS — L02413 Cutaneous abscess of right upper limb: Secondary | ICD-10-CM | POA: Diagnosis not present

## 2021-08-19 DIAGNOSIS — M1711 Unilateral primary osteoarthritis, right knee: Secondary | ICD-10-CM

## 2021-08-19 HISTORY — DX: Herpesviral vesicular dermatitis: B00.1

## 2021-08-19 HISTORY — DX: Unilateral primary osteoarthritis, right knee: M17.11

## 2021-08-19 MED ORDER — DOXYCYCLINE HYCLATE 100 MG PO TABS: 100.0000 mg | ORAL_TABLET | Freq: Two times a day (BID) | ORAL | 11 refills | Status: DC

## 2021-08-19 MED ORDER — CEFDINIR 300 MG PO CAPS: 300.0000 mg | ORAL_CAPSULE | Freq: Two times a day (BID) | ORAL | 11 refills | Status: DC

## 2021-08-19 NOTE — Progress Notes (Signed)
Subjective:  Chief complaint painful rash around her lips which is consistent with herpes labialis also pain in her right knee where she has osteoarthritis  Patient ID: [name], female    DOB: December 23, 1945, 75 y.o.   MRN: [id number]  HPI  Kimoni is a 75 year old [name] lady with [facility] medical problems including atrial fibrillation on Coumadin morbid obesity with chronic lymphedema who was admitted to the hospital in September with systemic infection.  She was ultimately found to have evidence of septic arthritis involving her right prosthetic shoulder.  She was taken to the operating room on September 21 underwent I&D of deep abscess with resection arthroplasty of the shoulder.  Cultures were taken and asked for them to be incubated for 21 days.  I reviewed the cultures from the OR neither have yielded an organism.  Note she had been on antibiotics for quite some time prior to the infection in the shoulder having been clearly identified as the cause of her systemic symptoms.  She has been on an empiric regimen of ceftriaxone and vancomycin at a skilled nursing facility.  He is on course to complete antibiotics on September 07, 2021.  She has a tunneled line placed through which she is getting her IV antibiotics.  Her shoulder pain is continued to improve.  She is going to see Dr. Griffin Basil tomorrow for follow-up.  She also is going to see someone for some pain in her right knee where she has had corticosteroid injections in the past and she asked if this would be okay.  I am all right with that as long as it is only a local corticosteroid injection and she is not getting systemic steroids.  She did have an eruption of what looks like herpes labialis roughly 4 days ago she says she had a similar episode roughly a year ago.   Past Medical History:  Diagnosis Date   Arthritis    Cancer [facility])    breast cancer   Chronic atrial fibrillation (HCC)    Chronic kidney disease    sees Dr  Florene Glen   Cramps, muscle, general    Dyspnea on exertion    Dysrhythmia    a-fib,    GERD (gastroesophageal reflux disease)    Headache    Herpes labialis 08/19/2021   Hyperlipidemia    Hypertension    Hypothyroidism    Lymphedema    Moderate to severe pulmonary hypertension ([place])    Obesity    Osteoarthritis of right knee 08/19/2021   Personal history of radiation therapy    Pneumonia 12/2020   Presence of permanent cardiac pacemaker 01/21/2021   for bradycardia    Syncope 12/2020   needed a pacemaker    Past Surgical History:  Procedure Laterality Date   APPENDECTOMY     BREAST BIOPSY Left 2018   BREAST LUMPECTOMY Left 04/04/2017   x3   BREAST LUMPECTOMY WITH NEEDLE LOCALIZATION AND AXILLARY SENTINEL LYMPH NODE BX Left 04/04/2017   Procedure: BREAST LUMPECTOMY WITH NEEDLE LOCALIZATION x3 AND AXILLARY SENTINEL LYMPH NODE BX;  Surgeon: [name], MD;  Location: [place];  Service: General;  Laterality: Left;   CATARACT EXTRACTION W/ INTRAOCULAR LENS  IMPLANT, BILATERAL Bilateral 2018   COLONOSCOPY     EYE SURGERY     GLAUCOMA SURGERY Bilateral 2018   HARDWARE REMOVAL Right 07/26/2021   Procedure: RIGHT SHOULDER HARDWARE REMOVAL WITH WASHOUT;  Surgeon: Hiram Gash, MD;  Location: [facility];  Service: Orthopedics;  Laterality: Right;  INSERT / REPLACE / REMOVE PACEMAKER  01/21/2021   IR FLUORO GUIDE CV LINE LEFT  07/29/2021   IR US GUIDE BX ASP/DRAIN  07/16/2021   RE-EXCISION OF BREAST CANCER,SUPERIOR MARGINS Left 04/26/2017   Procedure: RE-EXCISION OF LEFT BREAST CANCER;  Surgeon: [name], MD;  Location: [place];  Service: General;  Laterality: Left;   SHOULDER HEMI-ARTHROPLASTY Right 03/17/2021   Procedure: SHOULDER HEMI-ARTHROPLASTY;  Surgeon: Hiram Gash, MD;  Location: [facility];  Service: Orthopedics;  Laterality: Right;    Family History  Problem Relation Age of Onset   CAD Mother 25   Stroke Mother 72       hemorr CVA   COPD Father 35   Cancer Neg Hx        Social History   Socioeconomic History   Marital status: Widowed    Spouse name: Not on file   Number of children: 1   Years of education: Not on file   Highest education level: Not on file  Occupational History   Not on file  Tobacco Use   Smoking status: Never   Smokeless tobacco: Never  Vaping Use   Vaping Use: Never used  Substance and Sexual Activity   Alcohol use: No    Alcohol/week: 0.0 standard drinks   Drug use: No   Sexual activity: Not on file  Other Topics Concern   Not on file  Social History Narrative   Not on file   Social Determinants of Health   Financial Resource Strain: Low Risk    Difficulty of Paying Living Expenses: Not hard at all  Food Insecurity: No Food Insecurity   Worried About Charity fundraiser in the Last Year: Never true   Arboriculturist in the Last Year: Never true  Transportation Needs: Public librarian (Medical): Yes   Lack of Transportation (Non-Medical): Yes  Physical Activity: Inactive   Days of Exercise per Week: 0 days   Minutes of Exercise per Session: 0 min  Stress: No Stress Concern Present   Feeling of Stress : Not at all  Social Connections: Socially Isolated   Frequency of Communication with Friends and Family: More than three times a week   Frequency of Social Gatherings with Friends and Family: Once a week   Attends Religious Services: Never   Marine scientist or Organizations: No   Attends Archivist Meetings: Never   Marital Status: Widowed    Allergies  Allergen Reactions   Chlorhexidine Gluconate Hives   Penicillins Itching    Has patient had a PCN reaction causing immediate rash, facial/tongue/throat swelling, SOB or lightheadedness with hypotension: no Has patient had a PCN reaction causing severe rash involving mucus membranes or skin necrosis: No Has patient had a PCN reaction that required hospitalization: No Has patient had a PCN reaction occurring  within the last 10 years: No If all of the above answers are "NO", then may proceed with Cephalosporin use. Tolerated Cephalosporin Date: 03/17/21. Td Ancef 2g 07/26/21     Current Outpatient Medications:    acetaminophen (TYLENOL) 325 MG tablet, Take 2 tablets (650 mg total) by mouth every 6 (six) hours as needed for mild pain or moderate pain (or Fever >/= 101)., Disp: , Rfl:    allopurinol (ZYLOPRIM) 100 MG tablet, Take 1 tablet (100 mg total) by mouth daily., Disp: , Rfl:    apixaban (ELIQUIS) 5 MG TABS tablet, Take 1 tablet (5 mg total)  by mouth 2 (two) times daily., Disp: , Rfl:    carvedilol (COREG) 6.25 MG tablet, Take 1 tablet (6.25 mg total) by mouth 2 (two) times daily., Disp: , Rfl:    cefdinir (OMNICEF) 300 MG capsule, Take 1 capsule (300 mg total) by mouth 2 (two) times daily., Disp: 60 capsule, Rfl: 11   cefTRIAXone (ROCEPHIN) IVPB, Inject 2 g into the vein daily. Indication:  septic shoulder First Dose: No Last Day of Therapy:  09/07/21 Labs - Once weekly:  CBC/D and BMP, Labs - Every other week:  ESR and CRP Method of administration: IV Push Method of administration may be changed at the discretion of home infusion pharmacist based upon assessment of the patient and/or caregiver's ability to self-administer the medication ordered., Disp: 39 Units, Rfl: 0   Cholecalciferol (VITAMIN D3) 2000 units capsule, Take 1 capsule (2,000 Units total) by mouth daily., Disp: 100 capsule, Rfl: 3   Cyanocobalamin (VITAMIN B-12 PO), Take 2,000 mcg by mouth daily., Disp: , Rfl:    diclofenac Sodium (VOLTAREN) 1 % GEL, APPLY FOUR GRAMS FOUR TIMES DAILY AS NEEDED (Patient taking differently: Apply 4 g topically 4 (four) times daily as needed (pain).), Disp: 100 g, Rfl: 5   docusate sodium (COLACE) 100 MG capsule, Take 1 capsule (100 mg total) by mouth 2 (two) times daily., Disp: , Rfl:    doxycycline (VIBRA-TABS) 100 MG tablet, Take 1 tablet (100 mg total) by mouth 2 (two) times daily., Disp: 60 tablet,  Rfl: 11   exemestane (AROMASIN) 25 MG tablet, Take 1 tablet (25 mg total) by mouth daily after breakfast., Disp: 90 tablet, Rfl: 3   lactulose (CHRONULAC) 10 GM/15ML solution, Take 15 mLs (10 g total) by mouth 3 (three) times daily., Disp: , Rfl:    levothyroxine (SYNTHROID) 50 MCG tablet, Take 1 tablet (50 mcg total) by mouth daily before breakfast. Overdue for Annual appt must see provider for future refills, Disp: 90 tablet, Rfl: 3   lipase/protease/amylase (CREON) 12000-38000 units CPEP capsule, Take 1 capsule (12,000 Units total) by mouth daily as needed (stomach problems)., Disp: 270 capsule, Rfl: 3   magnesium oxide (MAG-OX) 400 MG tablet, Take 400 mg by mouth 2 (two) times daily., Disp: , Rfl:    methocarbamol (ROBAXIN) 500 MG tablet, Take 1 tablet (500 mg total) by mouth every 8 (eight) hours as needed for muscle spasms., Disp: 20 tablet, Rfl: 0   omeprazole (PRILOSEC) 40 MG capsule, Take 1 capsule (40 mg total) by mouth daily. Overdue for Annual appt must see provider for future refills (Patient taking differently: Take 40 mg by mouth daily as needed (acid reflux). Overdue for Annual appt must see provider for future refills), Disp: 90 capsule, Rfl: 3   OVER THE COUNTER MEDICATION, Apply 1 application topically daily as needed (apply to buttocks  as needed for irritaiton). Intensive Skin Care Therapy, Disp: , Rfl:    oxyCODONE (OXY IR/ROXICODONE) 5 MG immediate release tablet, Take 1 pills every 6 hrs as needed for severe pain, Disp: 24 tablet, Rfl: 0   polyvinyl alcohol (LIQUIFILM TEARS) 1.4 % ophthalmic solution, Place 1 drop into both eyes as needed for dry eyes., Disp: , Rfl:    potassium chloride (KLOR-CON) 8 MEQ tablet, Take 1 tablet (8 mEq total) by mouth daily., Disp: 90 tablet, Rfl: 3   pravastatin (PRAVACHOL) 20 MG tablet, Take 1 tablet (20 mg total) by mouth daily at 6 PM., Disp: , Rfl:    rifaximin (XIFAXAN) 550 MG TABS tablet,  Take 1 tablet (550 mg total) by mouth 2 (two) times  daily., Disp: , Rfl:    simethicone (MYLICON) 40 LM/7.8ML drops, Take 0.3 mLs (20 mg total) by mouth 4 (four) times daily as needed for flatulence., Disp: , Rfl:    tamoxifen (NOLVADEX) 20 MG tablet, Take 1 tablet (20 mg total) by mouth daily. Will take after finishing Exemestane, Disp: , Rfl:    torsemide (DEMADEX) 20 MG tablet, Take 1 tablet (20 mg total) by mouth daily., Disp: , Rfl:    vancomycin IVPB, Inject 500 mg into the vein daily. Indication:  septic shoulder First Dose: No Last Day of Therapy:  09/07/21 Labs - Sunday/Monday:  CBC/D, BMP, and vancomycin trough. Labs - Thursday:  BMP and vancomycin trough Labs - Every other week:  ESR and CRP Method of administration:Elastomeric Method of administration may be changed at the discretion of the patient and/or caregiver's ability to self-administer the medication ordered., Disp: 39 Units, Rfl: 0   Past Medical History:  Diagnosis Date   Arthritis    Cancer ([place])    breast cancer   Chronic atrial fibrillation (HCC)    Chronic kidney disease    sees Dr Florene Glen   Cramps, muscle, general    Dyspnea on exertion    Dysrhythmia    a-fib,    GERD (gastroesophageal reflux disease)    Headache    Hyperlipidemia    Hypertension    Hypothyroidism    Lymphedema    Moderate to severe pulmonary hypertension (HCC)    Obesity    Personal history of radiation therapy    Pneumonia 12/2020   Presence of permanent cardiac pacemaker 01/21/2021   for bradycardia    Syncope 12/2020   needed a pacemaker    Past Surgical History:  Procedure Laterality Date   APPENDECTOMY     BREAST BIOPSY Left 2018   BREAST LUMPECTOMY Left 04/04/2017   x3   BREAST LUMPECTOMY WITH NEEDLE LOCALIZATION AND AXILLARY SENTINEL LYMPH NODE BX Left 04/04/2017   Procedure: BREAST LUMPECTOMY WITH NEEDLE LOCALIZATION x3 AND AXILLARY SENTINEL LYMPH NODE BX;  Surgeon: [name], MD;  Location: [place];  Service: General;  Laterality: Left;   CATARACT EXTRACTION W/  INTRAOCULAR LENS  IMPLANT, BILATERAL Bilateral 2018   COLONOSCOPY     EYE SURGERY     GLAUCOMA SURGERY Bilateral 2018   HARDWARE REMOVAL Right 07/26/2021   Procedure: RIGHT SHOULDER HARDWARE REMOVAL WITH WASHOUT;  Surgeon: Hiram Gash, MD;  [facility];  Service: Orthopedics;  Laterality: Right;   INSERT / REPLACE / REMOVE PACEMAKER  01/21/2021   IR FLUORO GUIDE CV LINE LEFT  07/29/2021   IR US GUIDE BX ASP/DRAIN  07/16/2021   RE-EXCISION OF BREAST CANCER,SUPERIOR MARGINS Left 04/26/2017   Procedure: RE-EXCISION OF LEFT BREAST CANCER;  Surgeon: [name], MD;  Location: [place];  Service: General;  Laterality: Left;   SHOULDER HEMI-ARTHROPLASTY Right 03/17/2021   Procedure: SHOULDER HEMI-ARTHROPLASTY;  Surgeon: Hiram Gash, MD;  [facility];  Service: Orthopedics;  Laterality: Right;    Family History  Problem Relation Age of Onset   CAD Mother 75   Stroke Mother 35       hemorr CVA   COPD Father 107   Cancer Neg Hx       Social History   Socioeconomic History   Marital status: Widowed    Spouse name: Not on file   Number of children: 1   Years of education: Not  on file   Highest education level: Not on file  Occupational History   Not on file  Tobacco Use   Smoking status: Never   Smokeless tobacco: Never  Vaping Use   Vaping Use: Never used  Substance and Sexual Activity   Alcohol use: No    Alcohol/week: 0.0 standard drinks   Drug use: No   Sexual activity: Not on file  Other Topics Concern   Not on file  Social History Narrative   Not on file   Social Determinants of Health   Financial Resource Strain: Low Risk    Difficulty of Paying Living Expenses: Not hard at all  Food Insecurity: No Food Insecurity   Worried About Charity fundraiser in the Last Year: Never true   Arboriculturist in the Last Year: Never true  Transportation Needs: Public librarian (Medical): Yes   Lack of Transportation (Non-Medical): Yes   Physical Activity: Inactive   Days of Exercise per Week: 0 days   Minutes of Exercise per Session: 0 min  Stress: No Stress Concern Present   Feeling of Stress : Not at all  Social Connections: Socially Isolated   Frequency of Communication with Friends and Family: More than three times a week   Frequency of Social Gatherings with Friends and Family: Once a week   Attends Religious Services: Never   Marine scientist or Organizations: No   Attends Archivist Meetings: Never   Marital Status: Widowed    Allergies  Allergen Reactions   Chlorhexidine Gluconate Hives   Penicillins Itching    Has patient had a PCN reaction causing immediate rash, facial/tongue/throat swelling, SOB or lightheadedness with hypotension: no Has patient had a PCN reaction causing severe rash involving mucus membranes or skin necrosis: No Has patient had a PCN reaction that required hospitalization: No Has patient had a PCN reaction occurring within the last 10 years: No If all of the above answers are "NO", then may proceed with Cephalosporin use. Tolerated Cephalosporin Date: 03/17/21. Td Ancef 2g 07/26/21     Current Outpatient Medications:    acetaminophen (TYLENOL) 325 MG tablet, Take 2 tablets (650 mg total) by mouth every 6 (six) hours as needed for mild pain or moderate pain (or Fever >/= 101)., Disp: , Rfl:    allopurinol (ZYLOPRIM) 100 MG tablet, Take 1 tablet (100 mg total) by mouth daily., Disp: , Rfl:    apixaban (ELIQUIS) 5 MG TABS tablet, Take 1 tablet (5 mg total) by mouth 2 (two) times daily., Disp: , Rfl:    carvedilol (COREG) 6.25 MG tablet, Take 1 tablet (6.25 mg total) by mouth 2 (two) times daily., Disp: , Rfl:    cefTRIAXone (ROCEPHIN) IVPB, Inject 2 g into the vein daily. Indication:  septic shoulder First Dose: No Last Day of Therapy:  09/07/21 Labs - Once weekly:  CBC/D and BMP, Labs - Every other week:  ESR and CRP Method of administration: IV Push Method of  administration may be changed at the discretion of home infusion pharmacist based upon assessment of the patient and/or caregiver's ability to self-administer the medication ordered., Disp: 39 Units, Rfl: 0   Cholecalciferol (VITAMIN D3) 2000 units capsule, Take 1 capsule (2,000 Units total) by mouth daily., Disp: 100 capsule, Rfl: 3   Cyanocobalamin (VITAMIN B-12 PO), Take 2,000 mcg by mouth daily., Disp: , Rfl:    diclofenac Sodium (VOLTAREN) 1 % GEL, APPLY FOUR GRAMS FOUR  TIMES DAILY AS NEEDED (Patient taking differently: Apply 4 g topically 4 (four) times daily as needed (pain).), Disp: 100 g, Rfl: 5   docusate sodium (COLACE) 100 MG capsule, Take 1 capsule (100 mg total) by mouth 2 (two) times daily., Disp: , Rfl:    exemestane (AROMASIN) 25 MG tablet, Take 1 tablet (25 mg total) by mouth daily after breakfast., Disp: 90 tablet, Rfl: 3   lactulose (CHRONULAC) 10 GM/15ML solution, Take 15 mLs (10 g total) by mouth 3 (three) times daily., Disp: , Rfl:    levothyroxine (SYNTHROID) 50 MCG tablet, Take 1 tablet (50 mcg total) by mouth daily before breakfast. Overdue for Annual appt must see provider for future refills, Disp: 90 tablet, Rfl: 3   lipase/protease/amylase (CREON) 12000-38000 units CPEP capsule, Take 1 capsule (12,000 Units total) by mouth daily as needed (stomach problems)., Disp: 270 capsule, Rfl: 3   magnesium oxide (MAG-OX) 400 MG tablet, Take 400 mg by mouth 2 (two) times daily., Disp: , Rfl:    methocarbamol (ROBAXIN) 500 MG tablet, Take 1 tablet (500 mg total) by mouth every 8 (eight) hours as needed for muscle spasms., Disp: 20 tablet, Rfl: 0   omeprazole (PRILOSEC) 40 MG capsule, Take 1 capsule (40 mg total) by mouth daily. Overdue for Annual appt must see provider for future refills (Patient taking differently: Take 40 mg by mouth daily as needed (acid reflux). Overdue for Annual appt must see provider for future refills), Disp: 90 capsule, Rfl: 3   OVER THE COUNTER MEDICATION,  Apply 1 application topically daily as needed (apply to buttocks  as needed for irritaiton). Intensive Skin Care Therapy, Disp: , Rfl:    oxyCODONE (OXY IR/ROXICODONE) 5 MG immediate release tablet, Take 1 pills every 6 hrs as needed for severe pain, Disp: 24 tablet, Rfl: 0   polyvinyl alcohol (LIQUIFILM TEARS) 1.4 % ophthalmic solution, Place 1 drop into both eyes as needed for dry eyes., Disp: , Rfl:    potassium chloride (KLOR-CON) 8 MEQ tablet, Take 1 tablet (8 mEq total) by mouth daily., Disp: 90 tablet, Rfl: 3   pravastatin (PRAVACHOL) 20 MG tablet, Take 1 tablet (20 mg total) by mouth daily at 6 PM., Disp: , Rfl:    rifaximin (XIFAXAN) 550 MG TABS tablet, Take 1 tablet (550 mg total) by mouth 2 (two) times daily., Disp: , Rfl:    simethicone (MYLICON) 40 BC/4.8GQ drops, Take 0.3 mLs (20 mg total) by mouth 4 (four) times daily as needed for flatulence., Disp: , Rfl:    tamoxifen (NOLVADEX) 20 MG tablet, Take 1 tablet (20 mg total) by mouth daily. Will take after finishing Exemestane, Disp: , Rfl:    torsemide (DEMADEX) 20 MG tablet, Take 1 tablet (20 mg total) by mouth daily., Disp: , Rfl:    vancomycin IVPB, Inject 500 mg into the vein daily. Indication:  septic shoulder First Dose: No Last Day of Therapy:  09/07/21 Labs - Sunday/Monday:  CBC/D, BMP, and vancomycin trough. Labs - Thursday:  BMP and vancomycin trough Labs - Every other week:  ESR and CRP Method of administration:Elastomeric Method of administration may be changed at the discretion of the patient and/or caregiver's ability to self-administer the medication ordered., Disp: 39 Units, Rfl: 0   Review of Systems  Constitutional:  Negative for activity change, appetite change, chills, diaphoresis, fatigue, fever and unexpected weight change.  HENT:  Negative for congestion, rhinorrhea, sinus pressure, sneezing, sore throat and trouble swallowing.   Eyes:  Negative for photophobia  and visual disturbance.  Respiratory:  Negative for  cough, chest tightness, shortness of breath, wheezing and stridor.   Cardiovascular:  Negative for chest pain, palpitations and leg swelling.  Gastrointestinal:  Negative for abdominal distention, abdominal pain, anal bleeding, blood in stool, constipation, diarrhea, nausea and vomiting.  Genitourinary:  Negative for difficulty urinating, dysuria, flank pain and hematuria.  Musculoskeletal:  Positive for myalgias. Negative for arthralgias, back pain, gait problem and joint swelling.  Skin:  Positive for rash. Negative for color change, pallor and wound.  Neurological:  Positive for weakness. Negative for dizziness, tremors and light-headedness.  Hematological:  Negative for adenopathy. Does not bruise/bleed easily.  Psychiatric/Behavioral:  Negative for agitation, behavioral problems, confusion, decreased concentration, dysphoric mood and sleep disturbance.       Objective:   Physical Exam Constitutional:      General: She is not in acute distress.    Appearance: Normal appearance. She is well-developed. She is obese. She is not ill-appearing or diaphoretic.  HENT:     Head: Normocephalic and atraumatic.     Right Ear: Hearing and external ear normal.     Left Ear: Hearing and external ear normal.     Nose: No nasal deformity or rhinorrhea.  Eyes:     General: No scleral icterus.    Conjunctiva/sclera: Conjunctivae normal.     Right eye: Right conjunctiva is not injected.     Left eye: Left conjunctiva is not injected.     Pupils: Pupils are equal, round, and reactive to light.  Neck:     Vascular: No JVD.  Cardiovascular:     Rate and Rhythm: Normal rate. Rhythm irregular.     Heart sounds: S1 normal and S2 normal.  Pulmonary:     Effort: Pulmonary effort is normal. No respiratory distress.     Breath sounds: No wheezing.  Abdominal:     General: There is no distension.     Palpations: Abdomen is soft.  Musculoskeletal:     Right shoulder: Decreased range of motion.     Left  shoulder: Normal.     Cervical back: Normal range of motion and neck supple.     Right hip: Tenderness present.     Left hip: Normal.     Right knee: Normal.     Left knee: Normal.  Lymphadenopathy:     Head:     Right side of head: No submandibular, preauricular or posterior auricular adenopathy.     Left side of head: No submandibular, preauricular or posterior auricular adenopathy.     Cervical: No cervical adenopathy.     Right cervical: No superficial or deep cervical adenopathy.    Left cervical: No superficial or deep cervical adenopathy.  Skin:    General: Skin is warm and dry.     Coloration: Skin is pale.     Findings: No abrasion, bruising, ecchymosis, erythema, lesion or rash.     Nails: There is no clubbing.  Neurological:     General: No focal deficit present.     Mental Status: She is alert and oriented to person, place, and time.  Psychiatric:        Attention and Perception: She is attentive.        Mood and Affect: Mood normal.        Speech: Speech normal.        Behavior: Behavior normal. Behavior is cooperative.        Thought Content: Thought content normal.  Judgment: Judgment normal.    Area of herpes labialis 08/19/2021:      Central line 08/19/2021:         Assessment & Plan:    Prosthetic joint infection: She is status post resection arthroplasty: This along with 6 weeks of antibiotics which are planned through November 1 should affect cure.  She is going to see Dr. Ophelia Charter tomorrow.  I would feel comfortable however giving her more extended oral antibiotics after she completes the IV antibiotics and would like to make sure I assess her in the clinic again as well.  We will have her complete her ceftriaxone and vancomycin in September 07, 2021.  Which she should then have her central line removed by radiology.  Would like her to then start cefdinir 300 mg twice daily and doxycycline 100 mg twice daily.  I printed  prescriptions for both of these.  Also put an order in for radiology remove her central line on the first.  I will then schedule to see me back in clinic in November so we can follow her up at that point in time.  Herpes labialis: This is too far into her course now day 4 for Valtrex to make a difference if she is having frequent recurrences recommend prophylactic Valtrex.  Atrial fibrillation on anticoagulation.  Osteoarthritis of right knee I am okay if they give her corticosteroid injection here.  Weakness and deconditioning: Apparently she is still not able to walk due to overall weakness and still resides in skilled nursing facility where she is getting PT.  Vaccine ounseling she does need to get vaccine against COVID and not having had any vaccines yet she also should get a high-dose flu vaccine but she wants to wait till the next visit.   I spent 42 minutes with the patient including  face to face counseling of the patient son regarding the nature of prosthetic joint infections and how they are treated based on culture results versus empiric therapy herpes labialis ways that it is treated nature of preventing this vaccine counseling personally reviewing CT of her right shoulder, along with her updated final cultures from the OR, CBC BMP vancomycin levels along with review of medical records in preparation for the visit and during the visit and in coordination of her care.

## 2021-08-19 NOTE — Patient Instructions (Signed)
We will have you complete your IV antibiotics on November 1 and then have radiology remove your central line.  I would like you to start oral antibiotics after completing IV antibiotics and these will be cefdinir 300 mg twice daily and doxycycline 100 mg twice daily.  I would like to then see you in my clinic again for follow-up.  I think we will get cure with the aggressive surgery that Dr. Griffin Basil performed along with the IV antibiotics followed by a short course of oral antibiotics

## 2021-08-20 ENCOUNTER — Ambulatory Visit: Payer: Self-pay

## 2021-08-20 DIAGNOSIS — M19011 Primary osteoarthritis, right shoulder: Secondary | ICD-10-CM | POA: Diagnosis not present

## 2021-08-20 NOTE — Progress Notes (Signed)
Pt no longer takes warfarin. Ending anticoagulation episodes.

## 2021-08-23 DIAGNOSIS — Z4889 Encounter for other specified surgical aftercare: Secondary | ICD-10-CM | POA: Diagnosis not present

## 2021-08-23 DIAGNOSIS — M79605 Pain in left leg: Secondary | ICD-10-CM | POA: Diagnosis not present

## 2021-08-23 DIAGNOSIS — L03119 Cellulitis of unspecified part of limb: Secondary | ICD-10-CM | POA: Diagnosis not present

## 2021-08-23 DIAGNOSIS — R600 Localized edema: Secondary | ICD-10-CM | POA: Diagnosis not present

## 2021-08-23 DIAGNOSIS — I890 Lymphedema, not elsewhere classified: Secondary | ICD-10-CM | POA: Diagnosis not present

## 2021-08-23 DIAGNOSIS — L02413 Cutaneous abscess of right upper limb: Secondary | ICD-10-CM | POA: Diagnosis not present

## 2021-08-23 DIAGNOSIS — M25511 Pain in right shoulder: Secondary | ICD-10-CM | POA: Diagnosis not present

## 2021-08-23 DIAGNOSIS — M79672 Pain in left foot: Secondary | ICD-10-CM | POA: Diagnosis not present

## 2021-08-24 DIAGNOSIS — M170 Bilateral primary osteoarthritis of knee: Secondary | ICD-10-CM | POA: Diagnosis not present

## 2021-08-25 DIAGNOSIS — R600 Localized edema: Secondary | ICD-10-CM | POA: Diagnosis not present

## 2021-08-25 DIAGNOSIS — M79605 Pain in left leg: Secondary | ICD-10-CM | POA: Diagnosis not present

## 2021-08-25 DIAGNOSIS — L209 Atopic dermatitis, unspecified: Secondary | ICD-10-CM | POA: Diagnosis not present

## 2021-08-25 DIAGNOSIS — Z792 Long term (current) use of antibiotics: Secondary | ICD-10-CM | POA: Diagnosis not present

## 2021-08-25 DIAGNOSIS — L02413 Cutaneous abscess of right upper limb: Secondary | ICD-10-CM | POA: Diagnosis not present

## 2021-08-25 DIAGNOSIS — L308 Other specified dermatitis: Secondary | ICD-10-CM | POA: Diagnosis not present

## 2021-08-25 DIAGNOSIS — I890 Lymphedema, not elsewhere classified: Secondary | ICD-10-CM | POA: Diagnosis not present

## 2021-08-27 ENCOUNTER — Inpatient Hospital Stay: Payer: Medicare HMO

## 2021-08-27 ENCOUNTER — Inpatient Hospital Stay: Payer: Medicare HMO | Admitting: Hematology

## 2021-08-30 DIAGNOSIS — N1832 Chronic kidney disease, stage 3b: Secondary | ICD-10-CM | POA: Diagnosis not present

## 2021-08-30 DIAGNOSIS — R1084 Generalized abdominal pain: Secondary | ICD-10-CM | POA: Diagnosis not present

## 2021-08-30 DIAGNOSIS — R600 Localized edema: Secondary | ICD-10-CM | POA: Diagnosis not present

## 2021-08-30 DIAGNOSIS — I890 Lymphedema, not elsewhere classified: Secondary | ICD-10-CM | POA: Diagnosis not present

## 2021-08-30 DIAGNOSIS — I4891 Unspecified atrial fibrillation: Secondary | ICD-10-CM | POA: Diagnosis not present

## 2021-08-30 DIAGNOSIS — M25511 Pain in right shoulder: Secondary | ICD-10-CM | POA: Diagnosis not present

## 2021-08-30 DIAGNOSIS — Z4889 Encounter for other specified surgical aftercare: Secondary | ICD-10-CM | POA: Diagnosis not present

## 2021-08-30 DIAGNOSIS — R339 Retention of urine, unspecified: Secondary | ICD-10-CM | POA: Diagnosis not present

## 2021-08-30 DIAGNOSIS — K5903 Drug induced constipation: Secondary | ICD-10-CM | POA: Diagnosis not present

## 2021-08-30 DIAGNOSIS — R102 Pelvic and perineal pain: Secondary | ICD-10-CM | POA: Diagnosis not present

## 2021-08-30 DIAGNOSIS — L03119 Cellulitis of unspecified part of limb: Secondary | ICD-10-CM | POA: Diagnosis not present

## 2021-08-30 DIAGNOSIS — L02413 Cutaneous abscess of right upper limb: Secondary | ICD-10-CM | POA: Diagnosis not present

## 2021-08-31 DIAGNOSIS — M170 Bilateral primary osteoarthritis of knee: Secondary | ICD-10-CM | POA: Diagnosis not present

## 2021-09-01 ENCOUNTER — Encounter (HOSPITAL_COMMUNITY): Payer: Self-pay | Admitting: Emergency Medicine

## 2021-09-01 ENCOUNTER — Emergency Department (HOSPITAL_COMMUNITY)
Admission: EM | Admit: 2021-09-01 | Discharge: 2021-09-02 | Disposition: A | Discharge status: Skilled Nursing Facility | Payer: Medicare HMO | Attending: Emergency Medicine | Admitting: Emergency Medicine

## 2021-09-01 DIAGNOSIS — N189 Chronic kidney disease, unspecified: Secondary | ICD-10-CM | POA: Diagnosis not present

## 2021-09-01 DIAGNOSIS — K5900 Constipation, unspecified: Secondary | ICD-10-CM | POA: Diagnosis not present

## 2021-09-01 DIAGNOSIS — R5381 Other malaise: Secondary | ICD-10-CM | POA: Diagnosis not present

## 2021-09-01 DIAGNOSIS — R319 Hematuria, unspecified: Secondary | ICD-10-CM | POA: Insufficient documentation

## 2021-09-01 DIAGNOSIS — E039 Hypothyroidism, unspecified: Secondary | ICD-10-CM | POA: Insufficient documentation

## 2021-09-01 DIAGNOSIS — Z950 Presence of cardiac pacemaker: Secondary | ICD-10-CM | POA: Insufficient documentation

## 2021-09-01 DIAGNOSIS — K5903 Drug induced constipation: Secondary | ICD-10-CM | POA: Diagnosis not present

## 2021-09-01 DIAGNOSIS — R0902 Hypoxemia: Secondary | ICD-10-CM | POA: Diagnosis not present

## 2021-09-01 DIAGNOSIS — Z853 Personal history of malignant neoplasm of breast: Secondary | ICD-10-CM | POA: Insufficient documentation

## 2021-09-01 DIAGNOSIS — R194 Change in bowel habit: Secondary | ICD-10-CM | POA: Diagnosis present

## 2021-09-01 DIAGNOSIS — R109 Unspecified abdominal pain: Secondary | ICD-10-CM | POA: Diagnosis not present

## 2021-09-01 DIAGNOSIS — Z96611 Presence of right artificial shoulder joint: Secondary | ICD-10-CM | POA: Insufficient documentation

## 2021-09-01 DIAGNOSIS — I4891 Unspecified atrial fibrillation: Secondary | ICD-10-CM | POA: Diagnosis not present

## 2021-09-01 DIAGNOSIS — R52 Pain, unspecified: Secondary | ICD-10-CM | POA: Diagnosis not present

## 2021-09-01 DIAGNOSIS — I129 Hypertensive chronic kidney disease with stage 1 through stage 4 chronic kidney disease, or unspecified chronic kidney disease: Secondary | ICD-10-CM | POA: Diagnosis not present

## 2021-09-01 DIAGNOSIS — Z79899 Other long term (current) drug therapy: Secondary | ICD-10-CM | POA: Insufficient documentation

## 2021-09-01 DIAGNOSIS — R39198 Other difficulties with micturition: Secondary | ICD-10-CM | POA: Diagnosis not present

## 2021-09-01 DIAGNOSIS — Z7901 Long term (current) use of anticoagulants: Secondary | ICD-10-CM | POA: Insufficient documentation

## 2021-09-01 DIAGNOSIS — R1084 Generalized abdominal pain: Secondary | ICD-10-CM | POA: Diagnosis not present

## 2021-09-01 DIAGNOSIS — R339 Retention of urine, unspecified: Secondary | ICD-10-CM | POA: Diagnosis not present

## 2021-09-01 LAB — COMPREHENSIVE METABOLIC PANEL
ALT: 11 U/L (ref 0–44)
AST: 23 U/L (ref 15–41)
Albumin: 3 g/dL — ABNORMAL LOW (ref 3.5–5.0)
Alkaline Phosphatase: 141 U/L — ABNORMAL HIGH (ref 38–126)
Anion gap: 9 (ref 5–15)
BUN: 14 mg/dL (ref 8–23)
CO2: 30 mmol/L (ref 22–32)
Calcium: 9.9 mg/dL (ref 8.9–10.3)
Chloride: 98 mmol/L (ref 98–111)
Creatinine, Ser: 1.28 mg/dL — ABNORMAL HIGH (ref 0.44–1.00)
GFR, Estimated: 44 mL/min — ABNORMAL LOW (ref >60)
Glucose, Bld: 111 mg/dL — ABNORMAL HIGH (ref 70–99)
Potassium: 4.1 mmol/L (ref 3.5–5.1)
Sodium: 137 mmol/L (ref 135–145)
Total Bilirubin: 1.5 mg/dL — ABNORMAL HIGH (ref 0.3–1.2)
Total Protein: 7.1 g/dL (ref 6.5–8.1)

## 2021-09-01 LAB — CBC WITH DIFFERENTIAL/PLATELET
Abs Immature Granulocytes: 0.02 10*3/uL (ref 0.00–0.07)
Basophils Absolute: 0 10*3/uL (ref 0.0–0.1)
Basophils Relative: 1 %
Eosinophils Absolute: 0.1 10*3/uL (ref 0.0–0.5)
Eosinophils Relative: 2 %
HCT: 32 % — ABNORMAL LOW (ref 36.0–46.0)
Hemoglobin: 10 g/dL — ABNORMAL LOW (ref 12.0–15.0)
Immature Granulocytes: 0 %
Lymphocytes Relative: 21 %
Lymphs Abs: 1.2 10*3/uL (ref 0.7–4.0)
MCH: 31.2 pg (ref 26.0–34.0)
MCHC: 31.3 g/dL (ref 30.0–36.0)
MCV: 99.7 fL (ref 80.0–100.0)
Monocytes Absolute: 0.6 10*3/uL (ref 0.1–1.0)
Monocytes Relative: 10 %
Neutro Abs: 4 10*3/uL (ref 1.7–7.7)
Neutrophils Relative %: 66 %
Platelets: 221 10*3/uL (ref 150–400)
RBC: 3.21 MIL/uL — ABNORMAL LOW (ref 3.87–5.11)
RDW: 17.2 % — ABNORMAL HIGH (ref 11.5–15.5)
WBC: 6 10*3/uL (ref 4.0–10.5)
nRBC: 0 % (ref 0.0–0.2)

## 2021-09-01 LAB — URINALYSIS, ROUTINE W REFLEX MICROSCOPIC
Bilirubin Urine: NEGATIVE
Glucose, UA: NEGATIVE mg/dL
Ketones, ur: NEGATIVE mg/dL
Nitrite: NEGATIVE
Protein, ur: NEGATIVE mg/dL
RBC / HPF: >50 RBC/hpf — ABNORMAL HIGH (ref 0–5)
Specific Gravity, Urine: 1.005 (ref 1.005–1.030)
pH: 8 (ref 5.0–8.0)

## 2021-09-01 MED ORDER — POLYETHYLENE GLYCOL 3350 17 G PO PACK: 17.0000 g | PACK | Freq: Every day | ORAL | 0 refills | Status: DC | PRN

## 2021-09-01 NOTE — ED Provider Notes (Signed)
[facility] DEPT Provider Note   CSN: [id number] Arrival date & time: 09/01/21  1432     History Chief Complaint  Patient presents with   Constipation   Urinary Retention    Katelyn Jackson is a 75 y.o. female.  Pt presents to the ED today with difficulty urinating and having a bowel movement.  Pt said she has not had a bm for 2 days and now can't urinate.  Pt did have some blood in her urine.  No fevers.  Due to language barrier, an interpreter was present during the history-taking and subsequent discussion (and for part of the physical exam) with this patient.       Past Medical History:  Diagnosis Date   Arthritis    Cancer [facility])    breast cancer   Chronic atrial fibrillation ([place])    Chronic kidney disease    sees Dr Florene Glen   Cramps, muscle, general    Dyspnea on exertion    Dysrhythmia    a-fib,    GERD (gastroesophageal reflux disease)    Headache    Herpes labialis 08/19/2021   Hyperlipidemia    Hypertension    Hypothyroidism    Lymphedema    Moderate to severe pulmonary hypertension ([place])    Obesity    Osteoarthritis of right knee 08/19/2021   Personal history of radiation therapy    Pneumonia 12/2020   Presence of permanent cardiac pacemaker 01/21/2021   for bradycardia    Syncope 12/2020   needed a pacemaker    Patient Active Problem List   Diagnosis Date Noted   Osteoarthritis of right knee 08/19/2021   Herpes labialis 08/19/2021   Decompensated hepatic cirrhosis ([place]) 07/22/2021   Abscess    Prosthetic joint infection ([place])    Abscess of right shoulder 07/21/2021   History of pacemaker 07/21/2021   Pressure injury of skin 07/03/2021   Hypothyroidism    Cellulitis    Postoperative anemia due to acute blood loss 03/19/2021   Dyspnea 03/19/2021   Symptomatic anemia 03/19/2021   Status post right shoulder hemiarthroplasty 03/17/2021   Bilateral pain of leg and foot 10/08/2020   Abscess of left thumb  06/17/2020   Cellulitis and abscess of left leg 06/17/2020   Herpes zoster 02/18/2020   GERD (gastroesophageal reflux disease) 11/25/2019   Wheezing 10/26/2017   Sciatica of left side 08/29/2017   Morbid obesity ([place]) 07/11/2017   Hyperglycemia 07/11/2017   Low back pain 06/19/2017   Acute renal failure superimposed on stage 3b chronic kidney disease ([place]) 06/19/2017   Hypokalemia 06/19/2017   Acute lower UTI 06/19/2017   Anemia of chronic disease 02/16/2017   Malignant neoplasm of upper-outer quadrant of left breast in female, estrogen receptor positive ([place]) 02/10/2017   Essential hypertension 04/05/2016   Dyslipidemia 04/05/2016   Pulmonary hypertension ([place]) 04/05/2016   Lymphedema 04/05/2016    Past Surgical History:  Procedure Laterality Date   APPENDECTOMY     BREAST BIOPSY Left 2018   BREAST LUMPECTOMY Left 04/04/2017   x3   BREAST LUMPECTOMY WITH NEEDLE LOCALIZATION AND AXILLARY SENTINEL LYMPH NODE BX Left 04/04/2017   Procedure: BREAST LUMPECTOMY WITH NEEDLE LOCALIZATION x3 AND AXILLARY SENTINEL LYMPH NODE BX;  Surgeon: [name], MD;  Location: [facility];  Service: General;  Laterality: Left;   CATARACT EXTRACTION W/ INTRAOCULAR LENS  IMPLANT, BILATERAL Bilateral 2018   COLONOSCOPY     EYE SURGERY     GLAUCOMA SURGERY Bilateral 2018   HARDWARE REMOVAL  Right 07/26/2021   Procedure: RIGHT SHOULDER HARDWARE REMOVAL WITH WASHOUT;  Surgeon: Hiram Gash, MD;  Location: [facility];  Service: Orthopedics;  Laterality: Right;   INSERT / REPLACE / REMOVE PACEMAKER  01/21/2021   IR FLUORO GUIDE CV LINE LEFT  07/29/2021   IR US GUIDE BX ASP/DRAIN  07/16/2021   RE-EXCISION OF BREAST CANCER,SUPERIOR MARGINS Left 04/26/2017   Procedure: RE-EXCISION OF LEFT BREAST CANCER;  Surgeon: [name], MD;  Location: [facility];  Service: General;  Laterality: Left;   SHOULDER HEMI-ARTHROPLASTY Right 03/17/2021   Procedure: SHOULDER HEMI-ARTHROPLASTY;  Surgeon: Hiram Gash, MD;  Location: [facility];   Service: Orthopedics;  Laterality: Right;     OB History   No obstetric history on file.     Family History  Problem Relation Age of Onset   CAD Mother 59   Stroke Mother 85       hemorr CVA   COPD Father 42   Cancer Neg Hx     Social History   Tobacco Use   Smoking status: Never   Smokeless tobacco: Never  Vaping Use   Vaping Use: Never used  Substance Use Topics   Alcohol use: No    Alcohol/week: 0.0 standard drinks   Drug use: No    Home Medications Prior to Admission medications   Medication Sig Start Date End Date Taking? Authorizing Provider  polyethylene glycol (MIRALAX) 17 g packet Take 17 g by mouth daily as needed. 09/01/21  Yes Isla Pence, MD  acetaminophen (TYLENOL) 325 MG tablet Take 2 tablets (650 mg total) by mouth every 6 (six) hours as needed for mild pain or moderate pain (or Fever >/= 101). 07/30/21   Hongalgi, Lenis Dickinson, MD  allopurinol (ZYLOPRIM) 100 MG tablet Take 1 tablet (100 mg total) by mouth daily. 07/30/21   Hongalgi, Lenis Dickinson, MD  apixaban (ELIQUIS) 5 MG TABS tablet Take 1 tablet (5 mg total) by mouth 2 (two) times daily. 07/30/21   Hongalgi, Lenis Dickinson, MD  carvedilol (COREG) 6.25 MG tablet Take 1 tablet (6.25 mg total) by mouth 2 (two) times daily. 07/30/21 07/30/22  Hongalgi, Lenis Dickinson, MD  cefdinir (OMNICEF) 300 MG capsule Take 1 capsule (300 mg total) by mouth 2 (two) times daily. 08/19/21   Truman Hayward, MD  cefTRIAXone (ROCEPHIN) IVPB Inject 2 g into the vein daily. Indication:  septic shoulder First Dose: No Last Day of Therapy:  09/07/21 Labs - Once weekly:  CBC/D and BMP, Labs - Every other week:  ESR and CRP Method of administration: IV Push Method of administration may be changed at the discretion of home infusion pharmacist based upon assessment of the patient and/or caregiver's ability to self-administer the medication ordered. 07/30/21 09/07/21  Modena Jansky, MD  Cholecalciferol (VITAMIN D3) 2000 units capsule Take 1 capsule  (2,000 Units total) by mouth daily. 07/11/17   Plotnikov, [name], MD  Cyanocobalamin (VITAMIN B-12 PO) Take 2,000 mcg by mouth daily.    [provider]  diclofenac Sodium (VOLTAREN) 1 % GEL APPLY FOUR GRAMS FOUR TIMES DAILY AS NEEDED Patient taking differently: Apply 4 g topically 4 (four) times daily as needed (pain). 06/11/21   Plotnikov, [name], MD  docusate sodium (COLACE) 100 MG capsule Take 1 capsule (100 mg total) by mouth 2 (two) times daily. 07/30/21   Hongalgi, Lenis Dickinson, MD  doxycycline (VIBRA-TABS) 100 MG tablet Take 1 tablet (100 mg total) by mouth 2 (two) times daily. 08/19/21  Tommy Medal, Lavell Islam, MD  exemestane (AROMASIN) 25 MG tablet Take 1 tablet (25 mg total) by mouth daily after breakfast. 10/08/20   Plotnikov, [name], MD  lactulose (CHRONULAC) 10 GM/15ML solution Take 15 mLs (10 g total) by mouth 3 (three) times daily. 07/30/21   Hongalgi, Lenis Dickinson, MD  levothyroxine (SYNTHROID) 50 MCG tablet Take 1 tablet (50 mcg total) by mouth daily before breakfast. Overdue for Annual appt must see provider for future refills 02/09/21   Plotnikov, [name], MD  lipase/protease/amylase (CREON) 12000-38000 units CPEP capsule Take 1 capsule (12,000 Units total) by mouth daily as needed (stomach problems). 10/08/20   Plotnikov, [name], MD  magnesium oxide (MAG-OX) 400 MG tablet Take 400 mg by mouth 2 (two) times daily.    [provider]  methocarbamol (ROBAXIN) 500 MG tablet Take 1 tablet (500 mg total) by mouth every 8 (eight) hours as needed for muscle spasms. 03/19/21   McBane, Maylene Roes, PA-C  omeprazole (PRILOSEC) 40 MG capsule Take 1 capsule (40 mg total) by mouth daily. Overdue for Annual appt must see provider for future refills Patient taking differently: Take 40 mg by mouth daily as needed (acid reflux). Overdue for Annual appt must see provider for future refills 10/08/20   Plotnikov, [name], MD  OVER THE COUNTER MEDICATION Apply 1 application topically daily as  needed (apply to buttocks  as needed for irritaiton). Intensive Skin Care Therapy    [provider]  oxyCODONE (OXY IR/ROXICODONE) 5 MG immediate release tablet Take 1 pills every 6 hrs as needed for severe pain 07/28/21   McBane, Maylene Roes, PA-C  polyvinyl alcohol (LIQUIFILM TEARS) 1.4 % ophthalmic solution Place 1 drop into both eyes as needed for dry eyes. 07/30/21   Hongalgi, Lenis Dickinson, MD  potassium chloride (KLOR-CON) 8 MEQ tablet Take 1 tablet (8 mEq total) by mouth daily. 10/08/20   Plotnikov, [name], MD  pravastatin (PRAVACHOL) 20 MG tablet Take 1 tablet (20 mg total) by mouth daily at 6 PM. 07/30/21   Hongalgi, Lenis Dickinson, MD  rifaximin (XIFAXAN) 550 MG TABS tablet Take 1 tablet (550 mg total) by mouth 2 (two) times daily. 07/30/21   Hongalgi, Lenis Dickinson, MD  simethicone (MYLICON) 40 MH/9.6QI drops Take 0.3 mLs (20 mg total) by mouth 4 (four) times daily as needed for flatulence. 07/30/21   Hongalgi, Lenis Dickinson, MD  tamoxifen (NOLVADEX) 20 MG tablet Take 1 tablet (20 mg total) by mouth daily. Will take after finishing Exemestane 07/30/21   Modena Jansky, MD  torsemide (DEMADEX) 20 MG tablet Take 1 tablet (20 mg total) by mouth daily. 07/30/21   Hongalgi, Lenis Dickinson, MD  vancomycin IVPB Inject 500 mg into the vein daily. Indication:  septic shoulder First Dose: No Last Day of Therapy:  09/07/21 Labs - Sunday/Monday:  CBC/D, BMP, and vancomycin trough. Labs - Thursday:  BMP and vancomycin trough Labs - Every other week:  ESR and CRP Method of administration:Elastomeric Method of administration may be changed at the discretion of the patient and/or caregiver's ability to self-administer the medication ordered. 07/30/21 09/07/21  Modena Jansky, MD    Allergies    Chlorhexidine gluconate and Penicillins  Review of Systems   Review of Systems  Gastrointestinal:  Positive for constipation.  Genitourinary:  Positive for difficulty urinating.  All other systems reviewed and are  negative.  Physical Exam Updated Vital Signs BP 131/83   Pulse 65   Temp 98.7 F (37.1 C) (Oral)  Resp 20   LMP  (LMP Unknown)   SpO2 92%   Physical Exam Vitals and nursing note reviewed.  Constitutional:      Appearance: Normal appearance.  HENT:     Head: Normocephalic and atraumatic.     Right Ear: External ear normal.     Left Ear: External ear normal.     Nose: Nose normal.     Mouth/Throat:     Mouth: Mucous membranes are moist.     Pharynx: Oropharynx is clear.  Eyes:     Extraocular Movements: Extraocular movements intact.     Conjunctiva/sclera: Conjunctivae normal.     Pupils: Pupils are equal, round, and reactive to light.  Cardiovascular:     Rate and Rhythm: Normal rate and regular rhythm.     Pulses: Normal pulses.     Heart sounds: Normal heart sounds.  Pulmonary:     Effort: Pulmonary effort is normal.     Breath sounds: Normal breath sounds.  Abdominal:     General: Abdomen is flat. Bowel sounds are normal.     Palpations: Abdomen is soft.  Musculoskeletal:        General: Normal range of motion.     Cervical back: Normal range of motion and neck supple.  Skin:    General: Skin is warm.     Capillary Refill: Capillary refill takes less than 2 seconds.  Neurological:     General: No focal deficit present.     Mental Status: She is alert and oriented to person, place, and time.  Psychiatric:        Mood and Affect: Mood normal.        Behavior: Behavior normal.    ED Results / Procedures / Treatments   Labs (all labs ordered are listed, but only abnormal results are displayed) Labs Reviewed  URINALYSIS, ROUTINE W REFLEX MICROSCOPIC - Abnormal; Notable for the following components:      Result Value   Color, Urine STRAW (*)    APPearance HAZY (*)    Hgb urine dipstick LARGE (*)    Leukocytes,Ua TRACE (*)    RBC / HPF >50 (*)    Bacteria, UA RARE (*)    All other components within normal limits  COMPREHENSIVE METABOLIC PANEL - Abnormal;  Notable for the following components:   Glucose, Bld 111 (*)    Creatinine, Ser 1.28 (*)    Albumin 3.0 (*)    Alkaline Phosphatase 141 (*)    Total Bilirubin 1.5 (*)    GFR, Estimated 44 (*)    All other components within normal limits  CBC WITH DIFFERENTIAL/PLATELET - Abnormal; Notable for the following components:   RBC 3.21 (*)    Hemoglobin 10.0 (*)    HCT 32.0 (*)    RDW 17.2 (*)    All other components within normal limits  URINE CULTURE    EKG None  Radiology No results found.  Procedures Procedures   Medications Ordered in ED Medications - No data to display  ED Course  I have reviewed the triage vital signs and the nursing notes.  Pertinent labs & imaging results that were available during my care of the patient were reviewed by me and considered in my medical decision making (see chart for details).    MDM Rules/Calculators/A&P                           While speaking with the patient, she  had a large bowel movement then urinated on her own.  She is feeling better after her large bm.  Pt has been able to urinate on her own.  Urine does show some blood, but she [facility] the [facility] yesterday.  I suspect it was a traumatic cath.  No infection.  Urine sent for cx.  Labs don't show anything acute.  Pt's sx have resolved on their own.  She is stable for d/c back to the facility. Final Clinical Impression(s) / ED Diagnoses Final diagnoses:  Constipation, unspecified constipation type    Rx / DC Orders ED Discharge Orders          Ordered    polyethylene glycol (MIRALAX) 17 g packet  Daily PRN        09/01/21 1855             Isla Pence, MD 09/01/21 1856

## 2021-09-01 NOTE — ED Notes (Signed)
When getting patient into a gown and onto a purwick pt had small bm residue on her bottom. Once pt was being cleaned pt had a large bm continuously. Pt also urinated. Pt was cleaned and bed sheets were changed. Pt placed on purwick. Pt has notable skin breakdown on her buttock and peri area.

## 2021-09-01 NOTE — ED Triage Notes (Signed)
Patient presents from [facility] with complaints of urinary retention, constipation and abdominal pain for 2 days. Nothing has helped. She was catheterized yesterday and now has blood in the urine.     EMS vitals: 148/78 BP 82 HR 16 RR 90% O2 sat on room air

## 2021-09-02 DIAGNOSIS — R531 Weakness: Secondary | ICD-10-CM | POA: Diagnosis not present

## 2021-09-02 DIAGNOSIS — Z7401 Bed confinement status: Secondary | ICD-10-CM | POA: Diagnosis not present

## 2021-09-03 LAB — URINE CULTURE: Culture: 50000 — AB

## 2021-09-04 NOTE — Progress Notes (Addendum)
ED Antimicrobial Stewardship Positive Culture Follow Up   Katelyn Jackson is an 75 y.o. female who presented to [facility] on 09/01/2021 with a chief complaint of urinary retention, abdominal pain, constipation and hematuria (s/p cath placement the day prior).  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 Tommy Medal at [facility].   Chief Complaint  Patient presents with   Constipation   Urinary Retention    Recent Results (from the past 720 hour(s))  Urine Culture     Status: Abnormal   Collection Time: 09/01/21  5:07 PM   Specimen: In/Out Cath Urine  Result Value Ref Range Status   Specimen Description   Final    IN/OUT CATH URINE Performed at [facility], [place] [address] [place], [place] [address]    Special Requests   Final    NONE Performed at [facility], [place] [address] [place], [place] [address]    Culture (A)  Final    50,000 COLONIES/mL VANCOMYCIN RESISTANT ENTEROCOCCUS   Report Status 09/03/2021 FINAL  Final   Organism ID, Bacteria VANCOMYCIN RESISTANT ENTEROCOCCUS (A)  Final      Susceptibility   Vancomycin resistant enterococcus - MIC*    AMPICILLIN >=32 RESISTANT Resistant     NITROFURANTOIN 64 INTERMEDIATE Intermediate     VANCOMYCIN >=32 RESISTANT Resistant     LINEZOLID 2 SENSITIVE Sensitive     * 50,000 COLONIES/mL VANCOMYCIN RESISTANT ENTEROCOCCUS   Plan: - Please ask patient to f/u with her ID physician (Dr. Tommy Medal).  Adden: spoke to Dr. Tommy Medal (who is on call today). He said there was no wbc in UA and ucx only had 50K of bacteria count so no treatment is needed at this time.  ED Provider: Dr. Sheryle Spray, Elvena Oyer P 09/04/2021, 9:02 AM Clinical Pharmacist [phone]

## 2021-09-05 ENCOUNTER — Telehealth: Payer: Self-pay | Admitting: Emergency Medicine

## 2021-09-05 NOTE — Telephone Encounter (Signed)
Post ED Visit - Positive Culture Follow-up: Unsuccessful Patient Follow-up  Culture assessed and recommendations reviewed by:  []  Elenor Quinones, Pharm.D. []  Heide Guile, Pharm.D., BCPS AQ-ID []  Parks Neptune, Pharm.D., BCPS []  Alycia Rossetti, Pharm.D., BCPS []  [place], [place].D., BCPS, AAHIVP []  Legrand Como, Pharm.D., BCPS, AAHIVP [x]  Dia Sitter, PharmD []  Vincenza Hews, PharmD, BCPS  Positive urine culture  []  Patient discharged without antimicrobial prescription and treatment is now indicated []  Organism is resistant to prescribed ED discharge antimicrobial []  Patient with positive blood cultures   Unable to contact patient at phone number on file, letter will be sent to address on file.  Plan: Please ask patient to follow-up with her ID physician Dr Tommy Medal. Dr Blima Dessert 09/05/2021, 4:35 PM

## 2021-09-06 DIAGNOSIS — K5903 Drug induced constipation: Secondary | ICD-10-CM | POA: Diagnosis not present

## 2021-09-07 ENCOUNTER — Other Ambulatory Visit: Payer: Self-pay

## 2021-09-07 ENCOUNTER — Ambulatory Visit (HOSPITAL_COMMUNITY)
Admission: RE | Admit: 2021-09-07 | Discharge: 2021-09-07 | Disposition: A | Discharge status: Home / Self Care | Payer: Medicare HMO | Source: Ambulatory Visit | Attending: Infectious Disease | Admitting: Infectious Disease

## 2021-09-07 DIAGNOSIS — Y848 Other medical procedures as the cause of abnormal reaction of the patient, or of later complication, without mention of misadventure at the time of the procedure: Secondary | ICD-10-CM | POA: Diagnosis not present

## 2021-09-07 DIAGNOSIS — R5381 Other malaise: Secondary | ICD-10-CM | POA: Diagnosis not present

## 2021-09-07 DIAGNOSIS — M6281 Muscle weakness (generalized): Secondary | ICD-10-CM | POA: Diagnosis not present

## 2021-09-07 DIAGNOSIS — T8450XD Infection and inflammatory reaction due to unspecified internal joint prosthesis, subsequent encounter: Secondary | ICD-10-CM | POA: Insufficient documentation

## 2021-09-07 DIAGNOSIS — Z452 Encounter for adjustment and management of vascular access device: Secondary | ICD-10-CM | POA: Diagnosis not present

## 2021-09-07 DIAGNOSIS — R262 Difficulty in walking, not elsewhere classified: Secondary | ICD-10-CM | POA: Diagnosis not present

## 2021-09-07 DIAGNOSIS — M170 Bilateral primary osteoarthritis of knee: Secondary | ICD-10-CM | POA: Diagnosis not present

## 2021-09-07 DIAGNOSIS — M25511 Pain in right shoulder: Secondary | ICD-10-CM | POA: Diagnosis not present

## 2021-09-07 HISTORY — PX: IR REMOVAL TUN CV CATH W/O FL: IMG2289

## 2021-09-07 MED ORDER — LIDOCAINE HCL 1 % IJ SOLN
INTRAMUSCULAR | Status: AC
  Filled 2021-09-07: qty 20

## 2021-09-09 ENCOUNTER — Inpatient Hospital Stay: Payer: Medicare HMO

## 2021-09-09 ENCOUNTER — Inpatient Hospital Stay: Payer: Medicare HMO | Admitting: Hematology

## 2021-09-10 ENCOUNTER — Other Ambulatory Visit: Payer: Self-pay

## 2021-09-10 ENCOUNTER — Inpatient Hospital Stay (HOSPITAL_BASED_OUTPATIENT_CLINIC_OR_DEPARTMENT_OTHER): Payer: Medicare HMO | Admitting: Hematology

## 2021-09-10 ENCOUNTER — Inpatient Hospital Stay: Payer: Medicare HMO | Attending: Hematology

## 2021-09-10 VITALS — BP 136/72 | HR 70 | Temp 97.8°F | Resp 18 | Ht 61.0 in

## 2021-09-10 DIAGNOSIS — E785 Hyperlipidemia, unspecified: Secondary | ICD-10-CM | POA: Diagnosis not present

## 2021-09-10 DIAGNOSIS — E039 Hypothyroidism, unspecified: Secondary | ICD-10-CM | POA: Insufficient documentation

## 2021-09-10 DIAGNOSIS — N6002 Solitary cyst of left breast: Secondary | ICD-10-CM | POA: Insufficient documentation

## 2021-09-10 DIAGNOSIS — E669 Obesity, unspecified: Secondary | ICD-10-CM | POA: Insufficient documentation

## 2021-09-10 DIAGNOSIS — I129 Hypertensive chronic kidney disease with stage 1 through stage 4 chronic kidney disease, or unspecified chronic kidney disease: Secondary | ICD-10-CM | POA: Diagnosis not present

## 2021-09-10 DIAGNOSIS — E876 Hypokalemia: Secondary | ICD-10-CM | POA: Insufficient documentation

## 2021-09-10 DIAGNOSIS — Z79811 Long term (current) use of aromatase inhibitors: Secondary | ICD-10-CM | POA: Diagnosis not present

## 2021-09-10 DIAGNOSIS — Z923 Personal history of irradiation: Secondary | ICD-10-CM | POA: Insufficient documentation

## 2021-09-10 DIAGNOSIS — K5900 Constipation, unspecified: Secondary | ICD-10-CM | POA: Diagnosis not present

## 2021-09-10 DIAGNOSIS — I482 Chronic atrial fibrillation, unspecified: Secondary | ICD-10-CM | POA: Insufficient documentation

## 2021-09-10 DIAGNOSIS — I890 Lymphedema, not elsewhere classified: Secondary | ICD-10-CM | POA: Diagnosis not present

## 2021-09-10 DIAGNOSIS — R600 Localized edema: Secondary | ICD-10-CM | POA: Insufficient documentation

## 2021-09-10 DIAGNOSIS — N189 Chronic kidney disease, unspecified: Secondary | ICD-10-CM | POA: Diagnosis not present

## 2021-09-10 DIAGNOSIS — D631 Anemia in chronic kidney disease: Secondary | ICD-10-CM | POA: Diagnosis not present

## 2021-09-10 DIAGNOSIS — Z950 Presence of cardiac pacemaker: Secondary | ICD-10-CM | POA: Insufficient documentation

## 2021-09-10 DIAGNOSIS — R262 Difficulty in walking, not elsewhere classified: Secondary | ICD-10-CM | POA: Insufficient documentation

## 2021-09-10 DIAGNOSIS — C50412 Malignant neoplasm of upper-outer quadrant of left female breast: Secondary | ICD-10-CM | POA: Diagnosis not present

## 2021-09-10 DIAGNOSIS — Z9049 Acquired absence of other specified parts of digestive tract: Secondary | ICD-10-CM | POA: Diagnosis not present

## 2021-09-10 DIAGNOSIS — Z7901 Long term (current) use of anticoagulants: Secondary | ICD-10-CM | POA: Insufficient documentation

## 2021-09-10 DIAGNOSIS — Z170 Estrogen receptor positive status [ER+]: Secondary | ICD-10-CM

## 2021-09-10 DIAGNOSIS — M25569 Pain in unspecified knee: Secondary | ICD-10-CM | POA: Diagnosis not present

## 2021-09-10 DIAGNOSIS — I272 Pulmonary hypertension, unspecified: Secondary | ICD-10-CM | POA: Diagnosis not present

## 2021-09-10 DIAGNOSIS — R531 Weakness: Secondary | ICD-10-CM | POA: Diagnosis not present

## 2021-09-10 DIAGNOSIS — M1990 Unspecified osteoarthritis, unspecified site: Secondary | ICD-10-CM | POA: Insufficient documentation

## 2021-09-10 DIAGNOSIS — D638 Anemia in other chronic diseases classified elsewhere: Secondary | ICD-10-CM

## 2021-09-10 DIAGNOSIS — Z880 Allergy status to penicillin: Secondary | ICD-10-CM | POA: Insufficient documentation

## 2021-09-10 LAB — COMPREHENSIVE METABOLIC PANEL
ALT: 11 U/L (ref 0–44)
AST: 21 U/L (ref 15–41)
Albumin: 3.3 g/dL — ABNORMAL LOW (ref 3.5–5.0)
Alkaline Phosphatase: 167 U/L — ABNORMAL HIGH (ref 38–126)
Anion gap: 11 (ref 5–15)
BUN: 13 mg/dL (ref 8–23)
CO2: 28 mmol/L (ref 22–32)
Calcium: 10.2 mg/dL (ref 8.9–10.3)
Chloride: 101 mmol/L (ref 98–111)
Creatinine, Ser: 1.53 mg/dL — ABNORMAL HIGH (ref 0.44–1.00)
GFR, Estimated: 35 mL/min — ABNORMAL LOW (ref >60)
Glucose, Bld: 99 mg/dL (ref 70–99)
Potassium: 3.7 mmol/L (ref 3.5–5.1)
Sodium: 140 mmol/L (ref 135–145)
Total Bilirubin: 1.7 mg/dL — ABNORMAL HIGH (ref 0.3–1.2)
Total Protein: 7.9 g/dL (ref 6.5–8.1)

## 2021-09-10 LAB — IRON AND TIBC
Iron: 68 ug/dL (ref 41–142)
Saturation Ratios: 31 % (ref 21–57)
TIBC: 218 ug/dL — ABNORMAL LOW (ref 236–444)
UIBC: 150 ug/dL (ref 120–384)

## 2021-09-10 LAB — FERRITIN: Ferritin: 345 ng/mL — ABNORMAL HIGH (ref 11–307)

## 2021-09-10 NOTE — Progress Notes (Signed)
[place]   [phone] [phone]   Clinic Follow up Note   Patient Care Team: Plotnikov, [name], MD as PCP - General (Internal Medicine) [name], MD as Consulting Physician (General Surgery) Truitt Merle, MD as Consulting Physician (Hematology) Kyung Rudd, MD as Consulting Physician (Radiation Oncology) Estanislado Emms, MD (Inactive) as Consulting Physician (Nephrology) Lorretta Harp, MD as Consulting Physician (Cardiology) Verner Chol, MD as Consulting Physician (Sports Medicine) Delice Bison, Charlestine Massed, NP as Nurse Practitioner (Hematology and Oncology) [facility] as Consulting Physician (Ophthalmology) Delice Bison, Darnelle Maffucci, [facility] as Pharmacist (Pharmacist) Antionette Char as Physician Assistant (Radiology)  Date of Service:  09/10/2021  CHIEF COMPLAINT: f/u of left breast cancer  CURRENT THERAPY:  Exemestane 25 mg daily starting 08/2017, switched to Tamoxifen in 09/2021 due to osteoporosis.  ASSESSMENT & PLAN:  Katelyn Jackson is a 75 y.o. female with   1. Malignant neoplasm of upper-outer quadrant of left breast, invasive ductal carcinoma,  stage IA (pT1b(m)N0), grade 2, ER+,PR+,HER2-, oncotype RS 18 -She was diagnosed in 02/2017. She is S/p left lumpectomy on 04/04/2017 and re-excision of close margin on 6/202/018, she has had complete surgical resection, surgical margins were negative. Lymph nodes were negative. -S/p adjuvant radiation 06/06/2017 to 07/19/2017 with Dr. Lisbeth Renshaw and tolerated well. -She started Exemestane in 08/2017. We previously discussed switching to tamoxifen. I prescribed for her 06/2021, but she states she never switched. -From a breast cancer standpoint, she is doing well. Physical exam showed some left breast swelling, likely secondary to her laying on her left side. Her most recent screening mammogram from 05/25/21 was negative. There is no concern for cancer recurrence.  -will switch  exemestane to tamoxifen, I will send my recommendation to her SNF. I reviewed potential AEs with her, especially risk of thrombosis, but she is already on Eliuquis for AF, so I think it's OK for her to take Tamoxifen    2. Constipation -she reports constipation, despite lactulose. -I advised her to use Miralax. She notes her facility does not give it to her. I will reach out to them.   2. Chronic atrial fibrillation, hyperlipidemia, HTN, lymphedema, pulmonary hypertension, obesity, hypothyroidism -Managed by her PCP and Cardiologist (Dr. Gwenlyn Found) -On Lasix, Lovastatin, Diovan-HCT, Coumadin 5 mg, and synthroid.   3. Anemia of chronic disease (CKD) and iron deficiency -low iron level in December 2018, ferritin was 33, relatively low given her CKD.  -She previously took oral ferrous sulfate. -She received two doses of Feraheme in 04/2021. -She follows up with nephrologist every 6 months    4. Osteoarthritis, arthralgia, osteoporosis -followed by ortho at [facility] -Pain is stable on AI -Her pain arthritis is in her knees. She receives knee injections q11months with ortho -08/2017 DEXA showed osteopenia (-1.4). Repeat 08/2020 showed borderline osteoporosis (-2.5) -she is taking vit D. She was previously taking calcium, but she notes her nephrologist took her off this due to her CKD.    6. Bilateral LE lymphedema -She notes her LE edema significantly worsened since 2019. She uses machine to massage her legs BID. -She notes due to knee pain and heaviness of her legs, she does not walk much. -She continues to have significant lymphedema to her bilateral legs.     PLAN: -I will reach out to [facility], and fax my recommendation of switching exemestane to tamoxifen and increase Miralax. -Lab and F/u in 6 months, her son will call to schedule. -next mammogram due 05/2022  No problem-specific Assessment & Plan notes found for this encounter.   SUMMARY OF ONCOLOGIC  HISTORY: Oncology History Overview Note  Cancer Staging Malignant neoplasm of upper-outer quadrant of left breast in female, estrogen receptor positive ([place]) Staging form: Breast, AJCC 8th Edition - Clinical stage from 02/06/2017: Stage IA (cT1b(m), cN0, cM0, G1, ER: Positive, PR: Positive, HER2: Negative) - Signed by Truitt Merle, MD on 02/14/2017 - Pathologic stage from 04/04/2017: Stage IA (pT1b(m), pN0, cM0, G2, ER: Positive, PR: Positive, HER2: Negative, Oncotype DX score: 18) - Signed by Truitt Merle, MD on 06/30/2017     Malignant neoplasm of upper-outer quadrant of left breast in female, estrogen receptor positive ([place])  01/26/2017 Mammogram   Screening mammogram on 01/26/17 showed possible asymmetries in the left breast.    02/02/2017 Mammogram   Diagnostic mammogram and [place]: 1. An irregular hypoechoic mass in the left breast at 1 o'clock 7 cm from the nipple measuring 6 x 3 x 4 mm.  2. There is an irregular hypoechoic mass in left breast at 3 o'clock 7 cm from the nipple measuring 4 x 4 x 3 mm.  3. There is a subtle hypoechoic mass in the left breast at 2:30 5 cm from the nipple measuring 3 x 3 x 3 mm 4. There are multiple other small cysts in the left breast  5. Left axilla (-) on [place]    02/06/2017 Initial Biopsy   Diagnosis 1. Breast, left, needle core biopsy, 1:00 o'clock, 7 CMFN - INVASIVE DUCTAL CARCINOMA, SEE COMMENT. - DUCTAL CARCINOMA IN SITU. 2. Breast, left, needle core biopsy, 3:00 o'clock, 7 CMFN - INVASIVE DUCTAL CARCINOMA, SEE COMMENT. - DUCTAL CARCINOMA IN SITU. Microscopic Comment 1. The carcinoma in parts #1 and 2 appear morphologically similar and grade 1.   02/06/2017 Receptors her2   Both biopsy ER 100%+, PR 100%+, HER2-, Ki67 10%   02/06/2017 Initial Diagnosis   Malignant neoplasm of upper-outer quadrant of left breast in female, estrogen receptor positive ([place])   04/04/2017 Surgery   Left breast lumpectomy and SLN biopsy    04/04/2017 Pathology Results    Diagnosis 1. Breast, lumpectomy, Left - MULTIFOCAL INVASIVE DUCTAL CARCINOMA, NOTTINGHAM GRADE 2 OF 3, 0.9 CM - DUCTAL CARCINOMA IN SITU, LOW GRADE (<1MM FROM MEDIAL MARGIN) - FIBROCYSTIC AND COLUMNAR CELL CHANGE - CALCIFICATIONS ASSOCIATED WITH CARCINOMA AND BENIGN DISEASE - MARGINS UNINVOLVED BY CARCINOMA - PREVIOUS BIOPSY SITE CHANGES - SEE ONCOLOGY TABLE BELOW 2. Lymph node, sentinel, biopsy, Left Axillary #1 - NO CARCINOMA IDENTIFIED IN ONE LYMPH NODE (0/1) 3. Lymph node, sentinel, biopsy, Left Axillary #2 - NO CARCINOMA IDENTIFIED IN ONE LYMPH NODE (0/1)   04/04/2017 Oncotype testing   RS 18, which predicts 10-year distant recurrence risk of 12% with tamoxifen    04/26/2017 Surgery   Re-excision for close margin, final pathyology was negative for cancer    06/06/2017 - 07/19/2017 Radiation Therapy   The Left breast was treated to 42.5 Gy in 17 fractions at 2.5 Gy per fraction. 2. The Left breast was boosted to 7.5 Gy in 3 fractions at 2.5 Gy per fraction.      06/18/2017 - 06/23/2017 Hospital Admission   Admit date: 06/18/17 Admission diagnosis: Intractable lower back pain- sciatica  Additional comments: Sent home with course of steroids and referral for f/u for back injections. Did have AKI w/ CR at 2/0, up from her baseline of 1.6. This slowed with IVF. Hypokalemia improved with K replacement. Radiotherapy was stopped at that time as she  was unable to lay on her back due to pain.    08/2017 -  Anti-estrogen oral therapy   Exemestane 25 mg dialy starting 08/2017     08/28/2017 Imaging   BONE DENSITY SCAN  ASSESSMENT: The BMD measured at Femur Neck Left is 0.848 g/cm2 with a T-score of -1.4.   DualFemur Neck Left 08/28/2017    71.7         -1.4    0.848 g/cm2  AP Spine  L1-L3     08/28/2017    71.7         -1.1    1.052 g/cm2   01/29/2018 Mammogram   IMPRESSION: 1. No mammographic evidence of breast malignancy. 2. Surgical and radiation changes within the LEFT breast.       INTERVAL HISTORY:  Katelyn Jackson is here for a follow up of breast clinic. She was last seen by me on 06/10/21. She presents to the clinic accompanied by an interpreter (family?). She is currently staying at [facility] because of her weakness and lymphedema. She is unsure how long she will have to stay there.  She reports she is unable to walk now because of her severe lymphedema in her legs. She reports constipation is an issue for her. She endorses using lactulose. Colace is also on her medication list here. She states her facility does not give her the treatments she needs.   All other systems were reviewed with the patient and are negative.  MEDICAL HISTORY:  Past Medical History:  Diagnosis Date   Arthritis    Cancer [facility])    breast cancer   Chronic atrial fibrillation (HCC)    Chronic kidney disease    sees Dr Florene Glen   Cramps, muscle, general    Dyspnea on exertion    Dysrhythmia    a-fib,    GERD (gastroesophageal reflux disease)    Headache    Herpes labialis 08/19/2021   Hyperlipidemia    Hypertension    Hypothyroidism    Lymphedema    Moderate to severe pulmonary hypertension ([place])    Obesity    Osteoarthritis of right knee 08/19/2021   Personal history of radiation therapy    Pneumonia 12/2020   Presence of permanent cardiac pacemaker 01/21/2021   for bradycardia    Syncope 12/2020   needed a pacemaker    SURGICAL HISTORY: Past Surgical History:  Procedure Laterality Date   APPENDECTOMY     BREAST BIOPSY Left 2018   BREAST LUMPECTOMY Left 04/04/2017   x3   BREAST LUMPECTOMY WITH NEEDLE LOCALIZATION AND AXILLARY SENTINEL LYMPH NODE BX Left 04/04/2017   Procedure: BREAST LUMPECTOMY WITH NEEDLE LOCALIZATION x3 AND AXILLARY SENTINEL LYMPH NODE BX;  Surgeon: [name], MD;  Location: [place];  Service: General;  Laterality: Left;   CATARACT EXTRACTION W/ INTRAOCULAR LENS  IMPLANT, BILATERAL Bilateral 2018   COLONOSCOPY     EYE  SURGERY     GLAUCOMA SURGERY Bilateral 2018   HARDWARE REMOVAL Right 07/26/2021   Procedure: RIGHT SHOULDER HARDWARE REMOVAL WITH WASHOUT;  Surgeon: Hiram Gash, MD;  Location: [facility];  Service: Orthopedics;  Laterality: Right;   INSERT / REPLACE / REMOVE PACEMAKER  01/21/2021   IR FLUORO GUIDE CV LINE LEFT  07/29/2021   IR REMOVAL TUN CV CATH W/O FL  09/07/2021   IR US GUIDE BX ASP/DRAIN  07/16/2021   RE-EXCISION OF BREAST CANCER,SUPERIOR MARGINS Left 04/26/2017   Procedure: RE-EXCISION OF LEFT BREAST CANCER;  Surgeon:  [name], MD;  Location: [place];  Service: General;  Laterality: Left;   SHOULDER HEMI-ARTHROPLASTY Right 03/17/2021   Procedure: SHOULDER HEMI-ARTHROPLASTY;  Surgeon: Hiram Gash, MD;  Location: [facility];  Service: Orthopedics;  Laterality: Right;    I have reviewed the social history and family history with the patient and they are unchanged from previous note.  ALLERGIES:  is allergic to chlorhexidine gluconate and penicillins.  MEDICATIONS:  Current Outpatient Medications  Medication Sig Dispense Refill   acetaminophen (TYLENOL) 325 MG tablet Take 2 tablets (650 mg total) by mouth every 6 (six) hours as needed for mild pain or moderate pain (or Fever >/= 101).     allopurinol (ZYLOPRIM) 100 MG tablet Take 1 tablet (100 mg total) by mouth daily.     apixaban (ELIQUIS) 5 MG TABS tablet Take 1 tablet (5 mg total) by mouth 2 (two) times daily.     carvedilol (COREG) 6.25 MG tablet Take 1 tablet (6.25 mg total) by mouth 2 (two) times daily.     cefdinir (OMNICEF) 300 MG capsule Take 1 capsule (300 mg total) by mouth 2 (two) times daily. 60 capsule 11   Cholecalciferol (VITAMIN D3) 2000 units capsule Take 1 capsule (2,000 Units total) by mouth daily. 100 capsule 3   Cyanocobalamin (VITAMIN B-12 PO) Take 2,000 mcg by mouth daily.     diclofenac Sodium (VOLTAREN) 1 % GEL APPLY FOUR GRAMS FOUR TIMES DAILY AS NEEDED (Patient taking differently: Apply 4 g topically 4 (four) times  daily as needed (pain).) 100 g 5   docusate sodium (COLACE) 100 MG capsule Take 1 capsule (100 mg total) by mouth 2 (two) times daily.     doxycycline (VIBRA-TABS) 100 MG tablet Take 1 tablet (100 mg total) by mouth 2 (two) times daily. 60 tablet 11   exemestane (AROMASIN) 25 MG tablet Take 1 tablet (25 mg total) by mouth daily after breakfast. 90 tablet 3   lactulose (CHRONULAC) 10 GM/15ML solution Take 15 mLs (10 g total) by mouth 3 (three) times daily.     levothyroxine (SYNTHROID) 50 MCG tablet Take 1 tablet (50 mcg total) by mouth daily before breakfast. Overdue for Annual appt must see provider for future refills 90 tablet 3   lipase/protease/amylase (CREON) 12000-38000 units CPEP capsule Take 1 capsule (12,000 Units total) by mouth daily as needed (stomach problems). 270 capsule 3   magnesium oxide (MAG-OX) 400 MG tablet Take 400 mg by mouth 2 (two) times daily.     methocarbamol (ROBAXIN) 500 MG tablet Take 1 tablet (500 mg total) by mouth every 8 (eight) hours as needed for muscle spasms. 20 tablet 0   omeprazole (PRILOSEC) 40 MG capsule Take 1 capsule (40 mg total) by mouth daily. Overdue for Annual appt must see provider for future refills (Patient taking differently: Take 40 mg by mouth daily as needed (acid reflux). Overdue for Annual appt must see provider for future refills) 90 capsule 3   OVER THE COUNTER MEDICATION Apply 1 application topically daily as needed (apply to buttocks  as needed for irritaiton). Intensive Skin Care Therapy     oxyCODONE (OXY IR/ROXICODONE) 5 MG immediate release tablet Take 1 pills every 6 hrs as needed for severe pain 24 tablet 0   polyethylene glycol (MIRALAX) 17 g packet Take 17 g by mouth daily as needed. 14 each 0   polyvinyl alcohol (LIQUIFILM TEARS) 1.4 % ophthalmic solution Place 1 drop into both eyes as needed for dry eyes.  potassium chloride (KLOR-CON) 8 MEQ tablet Take 1 tablet (8 mEq total) by mouth daily. 90 tablet 3   pravastatin  (PRAVACHOL) 20 MG tablet Take 1 tablet (20 mg total) by mouth daily at 6 PM.     rifaximin (XIFAXAN) 550 MG TABS tablet Take 1 tablet (550 mg total) by mouth 2 (two) times daily.     simethicone (MYLICON) 40 XU/3.8BF drops Take 0.3 mLs (20 mg total) by mouth 4 (four) times daily as needed for flatulence.     tamoxifen (NOLVADEX) 20 MG tablet Take 1 tablet (20 mg total) by mouth daily. Will take after finishing Exemestane     torsemide (DEMADEX) 20 MG tablet Take 1 tablet (20 mg total) by mouth daily.     No current facility-administered medications for this visit.    PHYSICAL EXAMINATION: ECOG PERFORMANCE STATUS: 3 - Symptomatic, >50% confined to bed  There were no vitals filed for this visit. Wt Readings from Last 3 Encounters:  07/28/21 238 lb 5.1 oz (108.1 kg)  06/28/21 250 lb (113.4 kg)  05/11/21 250 lb (113.4 kg)     GENERAL:alert, no distress and comfortable SKIN: skin color, texture, turgor are normal, no rashes or significant lesions EYES: normal, Conjunctiva are pink and non-injected, sclera clear  NECK: supple, thyroid normal size, non-tender, without nodularity LYMPH:  no palpable lymphadenopathy in the cervical, axillary  LUNGS: clear to auscultation and percussion with normal breathing effort HEART: regular rate & rhythm and no murmurs, (+) b/l lower extremity edema ABDOMEN:abdomen soft, non-tender and normal bowel sounds Musculoskeletal:no cyanosis of digits and no clubbing  NEURO: alert & oriented x 3 with fluent speech, no focal motor/sensory deficits BREAST: edema to lateral left breast around incision site. No palpable mass, nodules or adenopathy bilaterally. Breast exam benign.   LABORATORY DATA:  I have reviewed the data as listed CBC Latest Ref Rng & Units 09/01/2021 07/30/2021 07/29/2021  WBC 4.0 - 10.5 K/uL 6.0 5.1 5.7  Hemoglobin 12.0 - 15.0 g/dL 10.0(L) 8.2(L) 8.7(L)  Hematocrit 36.0 - 46.0 % 32.0(L) 27.2(L) 28.4(L)  Platelets 150 - 400 K/uL 221 133(L)  149(L)     CMP Latest Ref Rng & Units 09/01/2021 07/30/2021 07/29/2021  Glucose 70 - 99 mg/dL 111(H) 86 -  BUN 8 - 23 mg/dL 14 17 -  Creatinine 0.44 - 1.00 mg/dL 1.28(H) 1.05(H) 1.31(H)  Sodium 135 - 145 mmol/L 137 134(L) -  Potassium 3.5 - 5.1 mmol/L 4.1 3.8 -  Chloride 98 - 111 mmol/L 98 98 -  CO2 22 - 32 mmol/L 30 29 -  Calcium 8.9 - 10.3 mg/dL 9.9 9.6 -  Total Protein 6.5 - 8.1 g/dL 7.1 - -  Total Bilirubin 0.3 - 1.2 mg/dL 1.5(H) - -  Alkaline Phos 38 - 126 U/L 141(H) - -  AST 15 - 41 U/L 23 - -  ALT 0 - 44 U/L 11 - -      RADIOGRAPHIC STUDIES: I have personally reviewed the radiological images as listed and agreed with the findings in the report. No results found.    No orders of the defined types were placed in this encounter.  All questions were answered. The patient knows to call the clinic with any problems, questions or concerns. No barriers to learning was detected. The total time spent in the appointment was 30 minutes.     Truitt Merle, MD 09/10/2021   I, Wilburn Mylar, am acting as scribe for Truitt Merle, MD.   I have reviewed the above  documentation for accuracy and completeness, and I agree with the above.

## 2021-09-11 ENCOUNTER — Encounter: Payer: Self-pay | Admitting: Hematology

## 2021-09-13 ENCOUNTER — Telehealth: Payer: Self-pay | Admitting: Hematology

## 2021-09-13 NOTE — Telephone Encounter (Signed)
Scheduled follow-up appointment per 11/4 los. Patient's son is aware.

## 2021-09-14 ENCOUNTER — Other Ambulatory Visit: Payer: Self-pay

## 2021-09-14 NOTE — Progress Notes (Signed)
Spoke with the Unit Manager Natasha Mead via telephone at [facility] [phone]) regarding pt's medication changes and follow-up appts.  Katelyn Jackson was already aware of the pt's medication changes.  Verified changes with Charlene of the pt's medication changes.  Tamoxifen started on 09/11/2021 and Exemestane discontinued.  Pt's Miralax changed to 17gm BID.  Pt will follow-up with Dr. Burr Medico in 6 months with labs on 12/13/2021.  Charlene stated the pt will probably no longer be in their facility d/t they do not have long term beds.  Katelyn Jackson stated she will be sure to make the pt's family aware of her appt schedule at time of discharge from their facility.

## 2021-09-16 ENCOUNTER — Telehealth: Payer: Self-pay | Admitting: Infectious Disease

## 2021-09-16 DIAGNOSIS — R531 Weakness: Secondary | ICD-10-CM | POA: Diagnosis not present

## 2021-09-16 DIAGNOSIS — Z950 Presence of cardiac pacemaker: Secondary | ICD-10-CM | POA: Diagnosis not present

## 2021-09-16 NOTE — Telephone Encounter (Signed)
Spoke with [employer] and rescheduled patient for 11/16 at 10am. They will update patient's son.   Beryle Flock, RN

## 2021-09-16 NOTE — Telephone Encounter (Signed)
I have to leave tomorrow early and then come back.  This patient needs to be rescheduled but has not been.  Is possible for her to come either early in the day tomorrow or late or have a 4 PM appointment or I see that next week I have a new patient slot that she could be slotted into.  Her son is the best person to call.

## 2021-09-17 ENCOUNTER — Ambulatory Visit: Payer: Medicare HMO | Admitting: Infectious Disease

## 2021-09-20 ENCOUNTER — Telehealth: Payer: Self-pay | Admitting: *Deleted

## 2021-09-20 NOTE — Telephone Encounter (Signed)
Per Dr.Feng, called pt nurse Tobe Sos at [facility] with message below. Nurse verbalized understanding and will f/u.

## 2021-09-20 NOTE — Telephone Encounter (Signed)
-----   Message from Truitt Merle, MD sent at 09/19/2021 11:06 AM EST ----- Please let her SNF know that her iron level is slightly high, stop oral iron for now and repeat CBC and iron study in 2-3 months. Thanks   Truitt Merle  09/19/2021

## 2021-09-21 DIAGNOSIS — Z6841 Body Mass Index (BMI) 40.0 and over, adult: Secondary | ICD-10-CM | POA: Diagnosis not present

## 2021-09-21 DIAGNOSIS — N184 Chronic kidney disease, stage 4 (severe): Secondary | ICD-10-CM | POA: Diagnosis not present

## 2021-09-21 DIAGNOSIS — M109 Gout, unspecified: Secondary | ICD-10-CM | POA: Diagnosis not present

## 2021-09-21 DIAGNOSIS — I129 Hypertensive chronic kidney disease with stage 1 through stage 4 chronic kidney disease, or unspecified chronic kidney disease: Secondary | ICD-10-CM | POA: Diagnosis not present

## 2021-09-21 DIAGNOSIS — D649 Anemia, unspecified: Secondary | ICD-10-CM | POA: Diagnosis not present

## 2021-09-21 DIAGNOSIS — R600 Localized edema: Secondary | ICD-10-CM | POA: Diagnosis not present

## 2021-09-22 ENCOUNTER — Ambulatory Visit (INDEPENDENT_AMBULATORY_CARE_PROVIDER_SITE_OTHER): Payer: Medicare HMO | Admitting: Infectious Disease

## 2021-09-22 ENCOUNTER — Encounter: Payer: Self-pay | Admitting: Infectious Disease

## 2021-09-22 ENCOUNTER — Other Ambulatory Visit: Payer: Self-pay

## 2021-09-22 VITALS — BP 117/81 | HR 81 | Temp 97.9°F

## 2021-09-22 DIAGNOSIS — Z22358 Carrier of other enterobacterales: Secondary | ICD-10-CM

## 2021-09-22 DIAGNOSIS — Z2239 Carrier of other specified bacterial diseases: Secondary | ICD-10-CM

## 2021-09-22 DIAGNOSIS — L02413 Cutaneous abscess of right upper limb: Secondary | ICD-10-CM | POA: Diagnosis not present

## 2021-09-22 DIAGNOSIS — I482 Chronic atrial fibrillation, unspecified: Secondary | ICD-10-CM

## 2021-09-22 DIAGNOSIS — L853 Xerosis cutis: Secondary | ICD-10-CM | POA: Diagnosis not present

## 2021-09-22 DIAGNOSIS — I890 Lymphedema, not elsewhere classified: Secondary | ICD-10-CM

## 2021-09-22 DIAGNOSIS — T7840XA Allergy, unspecified, initial encounter: Secondary | ICD-10-CM

## 2021-09-22 DIAGNOSIS — T8450XD Infection and inflammatory reaction due to unspecified internal joint prosthesis, subsequent encounter: Secondary | ICD-10-CM | POA: Diagnosis not present

## 2021-09-22 DIAGNOSIS — I10 Essential (primary) hypertension: Secondary | ICD-10-CM

## 2021-09-22 DIAGNOSIS — B001 Herpesviral vesicular dermatitis: Secondary | ICD-10-CM | POA: Diagnosis not present

## 2021-09-22 DIAGNOSIS — Z22338 Carrier of other streptococcus: Secondary | ICD-10-CM | POA: Insufficient documentation

## 2021-09-22 HISTORY — DX: Carrier of other streptococcus: Z22.338

## 2021-09-22 HISTORY — DX: Carrier of other enterobacterales: Z22.358

## 2021-09-22 HISTORY — DX: Xerosis cutis: L85.3

## 2021-09-22 HISTORY — DX: Carrier of other specified bacterial diseases: Z22.39

## 2021-09-22 NOTE — Progress Notes (Signed)
Subjective:  Chief complaint : Dry skin around her mouth and legs and arms that she believes is due to an allergic reaction to an antibiotic.    Patient ID: Katelyn Jackson, female    DOB: 07/03/1946, 75 y.o.   MRN: [id number]  HPI  Katelyn Jackson is a 74 year old [name] lady with multiple medical problems including atrial fibrillation on Coumadin morbid obesity with chronic lymphedema who was admitted to the hospital in September with systemic infection.  She was ultimately found to have evidence of septic arthritis involving her right prosthetic shoulder.  She was taken to the operating room on September 21 underwent I&D of deep abscess with resection arthroplasty of the shoulder.  Cultures were taken and asked for them to be incubated for 21 days.  I reviewed the cultures from the OR neither have yielded an organism.  Note she had been on antibiotics for quite some time prior to the infection in the shoulder having been clearly identified as the cause of her systemic symptoms.  She has been on an empiric regimen of ceftriaxone and vancomycin at a skilled nursing facility.  He is on course to complete antibiotics on September 07, 2021.  She has a tunneled line placed through which she is getting her IV antibiotics.  Her shoulder pain is continued to improve.   She is seen Dr. Griffin Basil in follow-up.  Her PICC line was removed.  My plan was to place her on doxycycline and cefdinir but he never appears to been placed on these.  I had printed the prescriptions and placed them in her paperwork that was sent back to the skilled nursing facility.  Fortunately she her shoulder pain continues to improve.  She says she has pain when she is doing physical therapy sometimes with it and sometimes when the weather is changing as it did in the last few days.  She believes that she had allergic reaction to an antibiotic but I am a bit skeptical which she is showing me seems more like dry skin that is around  her mouth also her lower extremities and arms.  She is currently not on antibiotics at all.  She was seen in the ER due to severe constipation and urinary retention.  Urine culture was performed though I do not know why and it yielded some VRE is not inconsequential colony-forming units.     Past Medical History:  Diagnosis Date   Arthritis    Cancer [facility])    breast cancer   Chronic atrial fibrillation (HCC)    Chronic kidney disease    sees Dr Florene Glen   Cramps, muscle, general    Dyspnea on exertion    Dysrhythmia    a-fib,    GERD (gastroesophageal reflux disease)    Headache    Herpes labialis 08/19/2021   Hyperlipidemia    Hypertension    Hypothyroidism    Lymphedema    Moderate to severe pulmonary hypertension ([place])    Obesity    Osteoarthritis of right knee 08/19/2021   Personal history of radiation therapy    Pneumonia 12/2020   Presence of permanent cardiac pacemaker 01/21/2021   for bradycardia    Syncope 12/2020   needed a pacemaker    Past Surgical History:  Procedure Laterality Date   APPENDECTOMY     BREAST BIOPSY Left 2018   BREAST LUMPECTOMY Left 04/04/2017   x3   BREAST LUMPECTOMY WITH NEEDLE LOCALIZATION AND AXILLARY SENTINEL LYMPH NODE BX Left 04/04/2017  Procedure: BREAST LUMPECTOMY WITH NEEDLE LOCALIZATION x3 AND AXILLARY SENTINEL LYMPH NODE BX;  Surgeon: [name], MD;  Location: [place];  Service: General;  Laterality: Left;   CATARACT EXTRACTION W/ INTRAOCULAR LENS  IMPLANT, BILATERAL Bilateral 2018   COLONOSCOPY     EYE SURGERY     GLAUCOMA SURGERY Bilateral 2018   HARDWARE REMOVAL Right 07/26/2021   Procedure: RIGHT SHOULDER HARDWARE REMOVAL WITH WASHOUT;  Surgeon: Hiram Gash, MD;  Location: [facility];  Service: Orthopedics;  Laterality: Right;   INSERT / REPLACE / REMOVE PACEMAKER  01/21/2021   IR FLUORO GUIDE CV LINE LEFT  07/29/2021   IR REMOVAL TUN CV CATH W/O FL  09/07/2021   IR US GUIDE BX ASP/DRAIN  07/16/2021   RE-EXCISION OF BREAST  CANCER,SUPERIOR MARGINS Left 04/26/2017   Procedure: RE-EXCISION OF LEFT BREAST CANCER;  Surgeon: [name], MD;  Location: [place];  Service: General;  Laterality: Left;   SHOULDER HEMI-ARTHROPLASTY Right 03/17/2021   Procedure: SHOULDER HEMI-ARTHROPLASTY;  Surgeon: Hiram Gash, MD;  Location: [facility];  Service: Orthopedics;  Laterality: Right;    Family History  Problem Relation Age of Onset   CAD Mother 78   Stroke Mother 61       hemorr CVA   COPD Father 48   Cancer Neg Hx       Social History   Socioeconomic History   Marital status: Widowed    Spouse name: Not on file   Number of children: 1   Years of education: Not on file   Highest education level: Not on file  Occupational History   Not on file  Tobacco Use   Smoking status: Never   Smokeless tobacco: Never  Vaping Use   Vaping Use: Never used  Substance and Sexual Activity   Alcohol use: No    Alcohol/week: 0.0 standard drinks   Drug use: No   Sexual activity: Not on file  Other Topics Concern   Not on file  Social History Narrative   Not on file   Social Determinants of Health   Financial Resource Strain: Low Risk    Difficulty of Paying Living Expenses: Not hard at all  Food Insecurity: No Food Insecurity   Worried About Charity fundraiser in the Last Year: Never true   Arboriculturist in the Last Year: Never true  Transportation Needs: Public librarian (Medical): Yes   Lack of Transportation (Non-Medical): Yes  Physical Activity: Inactive   Days of Exercise per Week: 0 days   Minutes of Exercise per Session: 0 min  Stress: No Stress Concern Present   Feeling of Stress : Not at all  Social Connections: Socially Isolated   Frequency of Communication with Friends and Family: More than three times a week   Frequency of Social Gatherings with Friends and Family: Once a week   Attends Religious Services: Never   Marine scientist or Organizations: No    Attends Archivist Meetings: Never   Marital Status: Widowed    Allergies  Allergen Reactions   Chlorhexidine Gluconate Hives   Penicillins Itching    Has patient had a PCN reaction causing immediate rash, facial/tongue/throat swelling, SOB or lightheadedness with hypotension: no Has patient had a PCN reaction causing severe rash involving mucus membranes or skin necrosis: No Has patient had a PCN reaction that required hospitalization: No Has patient had a PCN reaction occurring within the last 10  years: No If all of the above answers are "NO", then may proceed with Cephalosporin use. Tolerated Cephalosporin Date: 03/17/21. Td Ancef 2g 07/26/21     Current Outpatient Medications:    acetaminophen (TYLENOL) 325 MG tablet, Take 2 tablets (650 mg total) by mouth every 6 (six) hours as needed for mild pain or moderate pain (or Fever >/= 101)., Disp: , Rfl:    allopurinol (ZYLOPRIM) 100 MG tablet, Take 1 tablet (100 mg total) by mouth daily., Disp: , Rfl:    apixaban (ELIQUIS) 5 MG TABS tablet, Take 1 tablet (5 mg total) by mouth 2 (two) times daily., Disp: , Rfl:    carvedilol (COREG) 6.25 MG tablet, Take 1 tablet (6.25 mg total) by mouth 2 (two) times daily., Disp: , Rfl:    cefdinir (OMNICEF) 300 MG capsule, Take 1 capsule (300 mg total) by mouth 2 (two) times daily. (Patient not taking: Reported on 09/10/2021), Disp: 60 capsule, Rfl: 11   Cholecalciferol (VITAMIN D3) 2000 units capsule, Take 1 capsule (2,000 Units total) by mouth daily., Disp: 100 capsule, Rfl: 3   Cyanocobalamin (VITAMIN B-12 PO), Take 2,000 mcg by mouth daily., Disp: , Rfl:    diclofenac Sodium (VOLTAREN) 1 % GEL, APPLY FOUR GRAMS FOUR TIMES DAILY AS NEEDED (Patient taking differently: Apply 4 g topically 4 (four) times daily as needed (pain).), Disp: 100 g, Rfl: 5   docusate sodium (COLACE) 100 MG capsule, Take 1 capsule (100 mg total) by mouth 2 (two) times daily., Disp: , Rfl:    doxycycline (VIBRA-TABS) 100  MG tablet, Take 1 tablet (100 mg total) by mouth 2 (two) times daily. (Patient not taking: Reported on 09/10/2021), Disp: 60 tablet, Rfl: 11   lactulose (CHRONULAC) 10 GM/15ML solution, Take 15 mLs (10 g total) by mouth 3 (three) times daily. (Patient not taking: Reported on 09/10/2021), Disp: , Rfl:    levothyroxine (SYNTHROID) 50 MCG tablet, Take 1 tablet (50 mcg total) by mouth daily before breakfast. Overdue for Annual appt must see provider for future refills, Disp: 90 tablet, Rfl: 3   lipase/protease/amylase (CREON) 12000-38000 units CPEP capsule, Take 1 capsule (12,000 Units total) by mouth daily as needed (stomach problems)., Disp: 270 capsule, Rfl: 3   magnesium oxide (MAG-OX) 400 MG tablet, Take 400 mg by mouth 2 (two) times daily., Disp: , Rfl:    methocarbamol (ROBAXIN) 500 MG tablet, Take 1 tablet (500 mg total) by mouth every 8 (eight) hours as needed for muscle spasms., Disp: 20 tablet, Rfl: 0   omeprazole (PRILOSEC) 40 MG capsule, Take 1 capsule (40 mg total) by mouth daily. Overdue for Annual appt must see provider for future refills (Patient taking differently: Take 40 mg by mouth daily as needed (acid reflux). Overdue for Annual appt must see provider for future refills), Disp: 90 capsule, Rfl: 3   OVER THE COUNTER MEDICATION, Apply 1 application topically daily as needed (apply to buttocks  as needed for irritaiton). Intensive Skin Care Therapy, Disp: , Rfl:    oxyCODONE (OXY IR/ROXICODONE) 5 MG immediate release tablet, Take 1 pills every 6 hrs as needed for severe pain, Disp: 24 tablet, Rfl: 0   polyethylene glycol (MIRALAX / GLYCOLAX) 17 g packet, Take 17 g by mouth 2 (two) times daily., Disp: , Rfl:    polyvinyl alcohol (LIQUIFILM TEARS) 1.4 % ophthalmic solution, Place 1 drop into both eyes as needed for dry eyes., Disp: , Rfl:    potassium chloride (KLOR-CON) 8 MEQ tablet, Take 1 tablet (8 mEq  total) by mouth daily., Disp: 90 tablet, Rfl: 3   pravastatin (PRAVACHOL) 20 MG tablet,  Take 1 tablet (20 mg total) by mouth daily at 6 PM., Disp: , Rfl:    rifaximin (XIFAXAN) 550 MG TABS tablet, Take 1 tablet (550 mg total) by mouth 2 (two) times daily., Disp: , Rfl:    simethicone (MYLICON) 40 DP/8.2UM drops, Take 0.3 mLs (20 mg total) by mouth 4 (four) times daily as needed for flatulence., Disp: , Rfl:    tamoxifen (NOLVADEX) 20 MG tablet, Take 1 tablet (20 mg total) by mouth daily. Will take after finishing Exemestane, Disp: , Rfl:    torsemide (DEMADEX) 20 MG tablet, Take 1 tablet (20 mg total) by mouth daily., Disp: , Rfl:    Past Medical History:  Diagnosis Date   Arthritis    Cancer ([place])    breast cancer   Chronic atrial fibrillation (HCC)    Chronic kidney disease    sees Dr Florene Glen   Cramps, muscle, general    Dyspnea on exertion    Dysrhythmia    a-fib,    GERD (gastroesophageal reflux disease)    Headache    Herpes labialis 08/19/2021   Hyperlipidemia    Hypertension    Hypothyroidism    Lymphedema    Moderate to severe pulmonary hypertension ([place])    Obesity    Osteoarthritis of right knee 08/19/2021   Personal history of radiation therapy    Pneumonia 12/2020   Presence of permanent cardiac pacemaker 01/21/2021   for bradycardia    Syncope 12/2020   needed a pacemaker    Past Surgical History:  Procedure Laterality Date   APPENDECTOMY     BREAST BIOPSY Left 2018   BREAST LUMPECTOMY Left 04/04/2017   x3   BREAST LUMPECTOMY WITH NEEDLE LOCALIZATION AND AXILLARY SENTINEL LYMPH NODE BX Left 04/04/2017   Procedure: BREAST LUMPECTOMY WITH NEEDLE LOCALIZATION x3 AND AXILLARY SENTINEL LYMPH NODE BX;  Surgeon: [name], MD;  Location: [place];  Service: General;  Laterality: Left;   CATARACT EXTRACTION W/ INTRAOCULAR LENS  IMPLANT, BILATERAL Bilateral 2018   COLONOSCOPY     EYE SURGERY     GLAUCOMA SURGERY Bilateral 2018   HARDWARE REMOVAL Right 07/26/2021   Procedure: RIGHT SHOULDER HARDWARE REMOVAL WITH WASHOUT;  Surgeon: Hiram Gash, MD;   [facility];  Service: Orthopedics;  Laterality: Right;   INSERT / REPLACE / REMOVE PACEMAKER  01/21/2021   IR FLUORO GUIDE CV LINE LEFT  07/29/2021   IR REMOVAL TUN CV CATH W/O FL  09/07/2021   IR US GUIDE BX ASP/DRAIN  07/16/2021   RE-EXCISION OF BREAST CANCER,SUPERIOR MARGINS Left 04/26/2017   Procedure: RE-EXCISION OF LEFT BREAST CANCER;  Surgeon: [name], MD;  Location: [place];  Service: General;  Laterality: Left;   SHOULDER HEMI-ARTHROPLASTY Right 03/17/2021   Procedure: SHOULDER HEMI-ARTHROPLASTY;  Surgeon: Hiram Gash, MD;  [facility];  Service: Orthopedics;  Laterality: Right;    Family History  Problem Relation Age of Onset   CAD Mother 38   Stroke Mother 53       hemorr CVA   COPD Father 59   Cancer Neg Hx       Social History   Socioeconomic History   Marital status: Widowed    Spouse name: Not on file   Number of children: 1   Years of education: Not on file   Highest education level: Not on file  Occupational History  Not on file  Tobacco Use   Smoking status: Never   Smokeless tobacco: Never  Vaping Use   Vaping Use: Never used  Substance and Sexual Activity   Alcohol use: No    Alcohol/week: 0.0 standard drinks   Drug use: No   Sexual activity: Not on file  Other Topics Concern   Not on file  Social History Narrative   Not on file   Social Determinants of Health   Financial Resource Strain: Low Risk    Difficulty of Paying Living Expenses: Not hard at all  Food Insecurity: No Food Insecurity   Worried About Charity fundraiser in the Last Year: Never true   Arboriculturist in the Last Year: Never true  Transportation Needs: Public librarian (Medical): Yes   Lack of Transportation (Non-Medical): Yes  Physical Activity: Inactive   Days of Exercise per Week: 0 days   Minutes of Exercise per Session: 0 min  Stress: No Stress Concern Present   Feeling of Stress : Not at all  Social Connections:  Socially Isolated   Frequency of Communication with Friends and Family: More than three times a week   Frequency of Social Gatherings with Friends and Family: Once a week   Attends Religious Services: Never   Marine scientist or Organizations: No   Attends Archivist Meetings: Never   Marital Status: Widowed    Allergies  Allergen Reactions   Chlorhexidine Gluconate Hives   Penicillins Itching    Has patient had a PCN reaction causing immediate rash, facial/tongue/throat swelling, SOB or lightheadedness with hypotension: no Has patient had a PCN reaction causing severe rash involving mucus membranes or skin necrosis: No Has patient had a PCN reaction that required hospitalization: No Has patient had a PCN reaction occurring within the last 10 years: No If all of the above answers are "NO", then may proceed with Cephalosporin use. Tolerated Cephalosporin Date: 03/17/21. Td Ancef 2g 07/26/21     Current Outpatient Medications:    acetaminophen (TYLENOL) 325 MG tablet, Take 2 tablets (650 mg total) by mouth every 6 (six) hours as needed for mild pain or moderate pain (or Fever >/= 101)., Disp: , Rfl:    allopurinol (ZYLOPRIM) 100 MG tablet, Take 1 tablet (100 mg total) by mouth daily., Disp: , Rfl:    apixaban (ELIQUIS) 5 MG TABS tablet, Take 1 tablet (5 mg total) by mouth 2 (two) times daily., Disp: , Rfl:    carvedilol (COREG) 6.25 MG tablet, Take 1 tablet (6.25 mg total) by mouth 2 (two) times daily., Disp: , Rfl:    cefdinir (OMNICEF) 300 MG capsule, Take 1 capsule (300 mg total) by mouth 2 (two) times daily. (Patient not taking: Reported on 09/10/2021), Disp: 60 capsule, Rfl: 11   Cholecalciferol (VITAMIN D3) 2000 units capsule, Take 1 capsule (2,000 Units total) by mouth daily., Disp: 100 capsule, Rfl: 3   Cyanocobalamin (VITAMIN B-12 PO), Take 2,000 mcg by mouth daily., Disp: , Rfl:    diclofenac Sodium (VOLTAREN) 1 % GEL, APPLY FOUR GRAMS FOUR TIMES DAILY AS NEEDED  (Patient taking differently: Apply 4 g topically 4 (four) times daily as needed (pain).), Disp: 100 g, Rfl: 5   docusate sodium (COLACE) 100 MG capsule, Take 1 capsule (100 mg total) by mouth 2 (two) times daily., Disp: , Rfl:    doxycycline (VIBRA-TABS) 100 MG tablet, Take 1 tablet (100 mg total) by mouth 2 (two)  times daily. (Patient not taking: Reported on 09/10/2021), Disp: 60 tablet, Rfl: 11   lactulose (CHRONULAC) 10 GM/15ML solution, Take 15 mLs (10 g total) by mouth 3 (three) times daily. (Patient not taking: Reported on 09/10/2021), Disp: , Rfl:    levothyroxine (SYNTHROID) 50 MCG tablet, Take 1 tablet (50 mcg total) by mouth daily before breakfast. Overdue for Annual appt must see provider for future refills, Disp: 90 tablet, Rfl: 3   lipase/protease/amylase (CREON) 12000-38000 units CPEP capsule, Take 1 capsule (12,000 Units total) by mouth daily as needed (stomach problems)., Disp: 270 capsule, Rfl: 3   magnesium oxide (MAG-OX) 400 MG tablet, Take 400 mg by mouth 2 (two) times daily., Disp: , Rfl:    methocarbamol (ROBAXIN) 500 MG tablet, Take 1 tablet (500 mg total) by mouth every 8 (eight) hours as needed for muscle spasms., Disp: 20 tablet, Rfl: 0   omeprazole (PRILOSEC) 40 MG capsule, Take 1 capsule (40 mg total) by mouth daily. Overdue for Annual appt must see provider for future refills (Patient taking differently: Take 40 mg by mouth daily as needed (acid reflux). Overdue for Annual appt must see provider for future refills), Disp: 90 capsule, Rfl: 3   OVER THE COUNTER MEDICATION, Apply 1 application topically daily as needed (apply to buttocks  as needed for irritaiton). Intensive Skin Care Therapy, Disp: , Rfl:    oxyCODONE (OXY IR/ROXICODONE) 5 MG immediate release tablet, Take 1 pills every 6 hrs as needed for severe pain, Disp: 24 tablet, Rfl: 0   polyethylene glycol (MIRALAX / GLYCOLAX) 17 g packet, Take 17 g by mouth 2 (two) times daily., Disp: , Rfl:    polyvinyl alcohol  (LIQUIFILM TEARS) 1.4 % ophthalmic solution, Place 1 drop into both eyes as needed for dry eyes., Disp: , Rfl:    potassium chloride (KLOR-CON) 8 MEQ tablet, Take 1 tablet (8 mEq total) by mouth daily., Disp: 90 tablet, Rfl: 3   pravastatin (PRAVACHOL) 20 MG tablet, Take 1 tablet (20 mg total) by mouth daily at 6 PM., Disp: , Rfl:    rifaximin (XIFAXAN) 550 MG TABS tablet, Take 1 tablet (550 mg total) by mouth 2 (two) times daily., Disp: , Rfl:    simethicone (MYLICON) 40 OH/6.0VP drops, Take 0.3 mLs (20 mg total) by mouth 4 (four) times daily as needed for flatulence., Disp: , Rfl:    tamoxifen (NOLVADEX) 20 MG tablet, Take 1 tablet (20 mg total) by mouth daily. Will take after finishing Exemestane, Disp: , Rfl:    torsemide (DEMADEX) 20 MG tablet, Take 1 tablet (20 mg total) by mouth daily., Disp: , Rfl:    Review of Systems  Constitutional:  Negative for activity change, appetite change, chills, diaphoresis, fatigue, fever and unexpected weight change.  HENT:  Negative for congestion, rhinorrhea, sinus pressure, sneezing, sore throat and trouble swallowing.   Eyes:  Negative for photophobia and visual disturbance.  Respiratory:  Negative for cough, chest tightness, shortness of breath, wheezing and stridor.   Cardiovascular:  Negative for chest pain, palpitations and leg swelling.  Gastrointestinal:  Positive for abdominal pain and constipation. Negative for abdominal distention, anal bleeding, blood in stool, diarrhea, nausea and vomiting.  Genitourinary:  Negative for difficulty urinating, dysuria, flank pain and hematuria.  Musculoskeletal:  Positive for myalgias. Negative for arthralgias, back pain, gait problem and joint swelling.  Skin:  Positive for rash. Negative for color change, pallor and wound.  Neurological:  Negative for dizziness, tremors, weakness and light-headedness.  Hematological:  Negative  for adenopathy. Does not bruise/bleed easily.  Psychiatric/Behavioral:  Negative  for agitation, behavioral problems, confusion, decreased concentration, dysphoric mood and sleep disturbance.       Objective:   Physical Exam Constitutional:      General: She is not in acute distress.    Appearance: Normal appearance. She is well-developed. She is obese. She is not ill-appearing or diaphoretic.  HENT:     Head: Normocephalic and atraumatic.     Right Ear: Hearing and external ear normal.     Left Ear: Hearing and external ear normal.     Nose: No nasal deformity or rhinorrhea.  Eyes:     General: No scleral icterus.    Conjunctiva/sclera: Conjunctivae normal.     Right eye: Right conjunctiva is not injected.     Left eye: Left conjunctiva is not injected.     Pupils: Pupils are equal, round, and reactive to light.  Neck:     Vascular: No JVD.  Cardiovascular:     Rate and Rhythm: Normal rate and regular rhythm.     Heart sounds: S1 normal and S2 normal.  Pulmonary:     Effort: Pulmonary effort is normal. No respiratory distress.     Breath sounds: No wheezing.  Abdominal:     General: There is no distension.     Palpations: Abdomen is soft.  Musculoskeletal:        General: Normal range of motion.     Right shoulder: Normal.     Left shoulder: Normal.     Cervical back: Normal range of motion and neck supple.     Right hip: Normal.     Left hip: Normal.     Right knee: Normal.     Left knee: Normal.     Right lower leg: Edema present.     Left lower leg: Edema present.  Lymphadenopathy:     Head:     Right side of head: No submandibular, preauricular or posterior auricular adenopathy.     Left side of head: No submandibular, preauricular or posterior auricular adenopathy.     Cervical: No cervical adenopathy.     Right cervical: No superficial or deep cervical adenopathy.    Left cervical: No superficial or deep cervical adenopathy.  Skin:    General: Skin is warm and dry.     Coloration: Skin is not pale.     Findings: No abrasion, bruising,  ecchymosis, erythema, lesion or rash.     Nails: There is no clubbing.  Neurological:     General: No focal deficit present.     Mental Status: She is alert and oriented to person, place, and time.  Psychiatric:        Attention and Perception: She is attentive.        Mood and Affect: Mood normal.        Speech: Speech normal.        Behavior: Behavior normal. Behavior is cooperative.        Thought Content: Thought content normal.        Judgment: Judgment normal.         Assessment & Plan:  Prosthetic joint infection status post resection arthroplasty:  She is received 6 weeks of parenteral antibiotics.  My intention was to follow this with oral antibiotics but these were never started.  Will check sed rate CRP CMP and CBC differential.  I will see her back in 1 month's time.  Alleged allergic reaction: I think this  is more likely simply dry skin she also did have an outbreak of herpes labialis when I saw her last.  Constipation urinary retention: Resolved  VRE constant colonization this really does not need to be treated.  Herpes labialis: This is resolved  Atrial fibrillation on anticoagulation.   Weakness and deconditioning: She continues to need rehabilitation at skilled nursing facility.  iPad and in person [name] translator used throughout the interview.

## 2021-09-23 DIAGNOSIS — M25532 Pain in left wrist: Secondary | ICD-10-CM | POA: Diagnosis not present

## 2021-09-23 DIAGNOSIS — R600 Localized edema: Secondary | ICD-10-CM | POA: Diagnosis not present

## 2021-09-24 DIAGNOSIS — M25532 Pain in left wrist: Secondary | ICD-10-CM | POA: Diagnosis not present

## 2021-09-24 DIAGNOSIS — M170 Bilateral primary osteoarthritis of knee: Secondary | ICD-10-CM | POA: Diagnosis not present

## 2021-09-24 DIAGNOSIS — R600 Localized edema: Secondary | ICD-10-CM | POA: Diagnosis not present

## 2021-09-25 LAB — COMPLETE METABOLIC PANEL WITHOUT GFR
AG Ratio: 0.9 (calc) — ABNORMAL LOW (ref 1.0–2.5)
ALT: 10 U/L (ref 6–29)
AST: 18 U/L (ref 10–35)
Albumin: 3.3 g/dL — ABNORMAL LOW (ref 3.6–5.1)
Alkaline phosphatase (APISO): 135 U/L (ref 37–153)
BUN/Creatinine Ratio: 15 (calc) (ref 6–22)
BUN: 24 mg/dL (ref 7–25)
CO2: 31 mmol/L (ref 20–32)
Calcium: 10.4 mg/dL (ref 8.6–10.4)
Chloride: 100 mmol/L (ref 98–110)
Creat: 1.58 mg/dL — ABNORMAL HIGH (ref 0.60–1.00)
Globulin: 3.6 g/dL (ref 1.9–3.7)
Glucose, Bld: 106 mg/dL — ABNORMAL HIGH (ref 65–99)
Potassium: 4.1 mmol/L (ref 3.5–5.3)
Sodium: 139 mmol/L (ref 135–146)
Total Bilirubin: 0.9 mg/dL (ref 0.2–1.2)
Total Protein: 6.9 g/dL (ref 6.1–8.1)
eGFR: 34 mL/min/1.73m2 — ABNORMAL LOW

## 2021-09-25 LAB — CBC WITH DIFFERENTIAL/PLATELET
Absolute Monocytes: 627 cells/uL (ref 200–950)
Basophils Absolute: 41 cells/uL (ref 0–200)
Basophils Relative: 0.8 %
Eosinophils Absolute: 291 cells/uL (ref 15–500)
Eosinophils Relative: 5.7 %
HCT: 34.5 % — ABNORMAL LOW (ref 35.0–45.0)
Hemoglobin: 11 g/dL — ABNORMAL LOW (ref 11.7–15.5)
Lymphs Abs: 1382 cells/uL (ref 850–3900)
MCH: 31.3 pg (ref 27.0–33.0)
MCHC: 31.9 g/dL — ABNORMAL LOW (ref 32.0–36.0)
MCV: 98.3 fL (ref 80.0–100.0)
MPV: 9.9 fL (ref 7.5–12.5)
Monocytes Relative: 12.3 %
Neutro Abs: 2759 cells/uL (ref 1500–7800)
Neutrophils Relative %: 54.1 %
Platelets: 198 10*3/uL (ref 140–400)
RBC: 3.51 10*6/uL — ABNORMAL LOW (ref 3.80–5.10)
RDW: 13.8 % (ref 11.0–15.0)
Total Lymphocyte: 27.1 %
WBC: 5.1 10*3/uL (ref 3.8–10.8)

## 2021-09-25 LAB — SED RATE MANUAL WEST RFLX: SED RATE BY MODIFIED WESTERGREN,MANUAL: 38 mm/h — ABNORMAL HIGH (ref 0–30)

## 2021-09-25 LAB — C-REACTIVE PROTEIN: CRP: 11.9 mg/L — ABNORMAL HIGH (ref <8.0)

## 2021-09-25 LAB — SEDIMENTATION RATE

## 2021-10-07 DIAGNOSIS — L02224 Furuncle of groin: Secondary | ICD-10-CM | POA: Diagnosis not present

## 2021-10-08 DIAGNOSIS — L02224 Furuncle of groin: Secondary | ICD-10-CM | POA: Diagnosis not present

## 2021-10-09 IMAGING — DX DG FOOT COMPLETE 3+V*L*
3 series · 3 of 3 positions shown · non-contrast
Comparison: January 19, 2021

CLINICAL DATA: History of LEFT foot fracture

EXAM:
LEFT FOOT - COMPLETE 3+ VIEW

[foot ap]
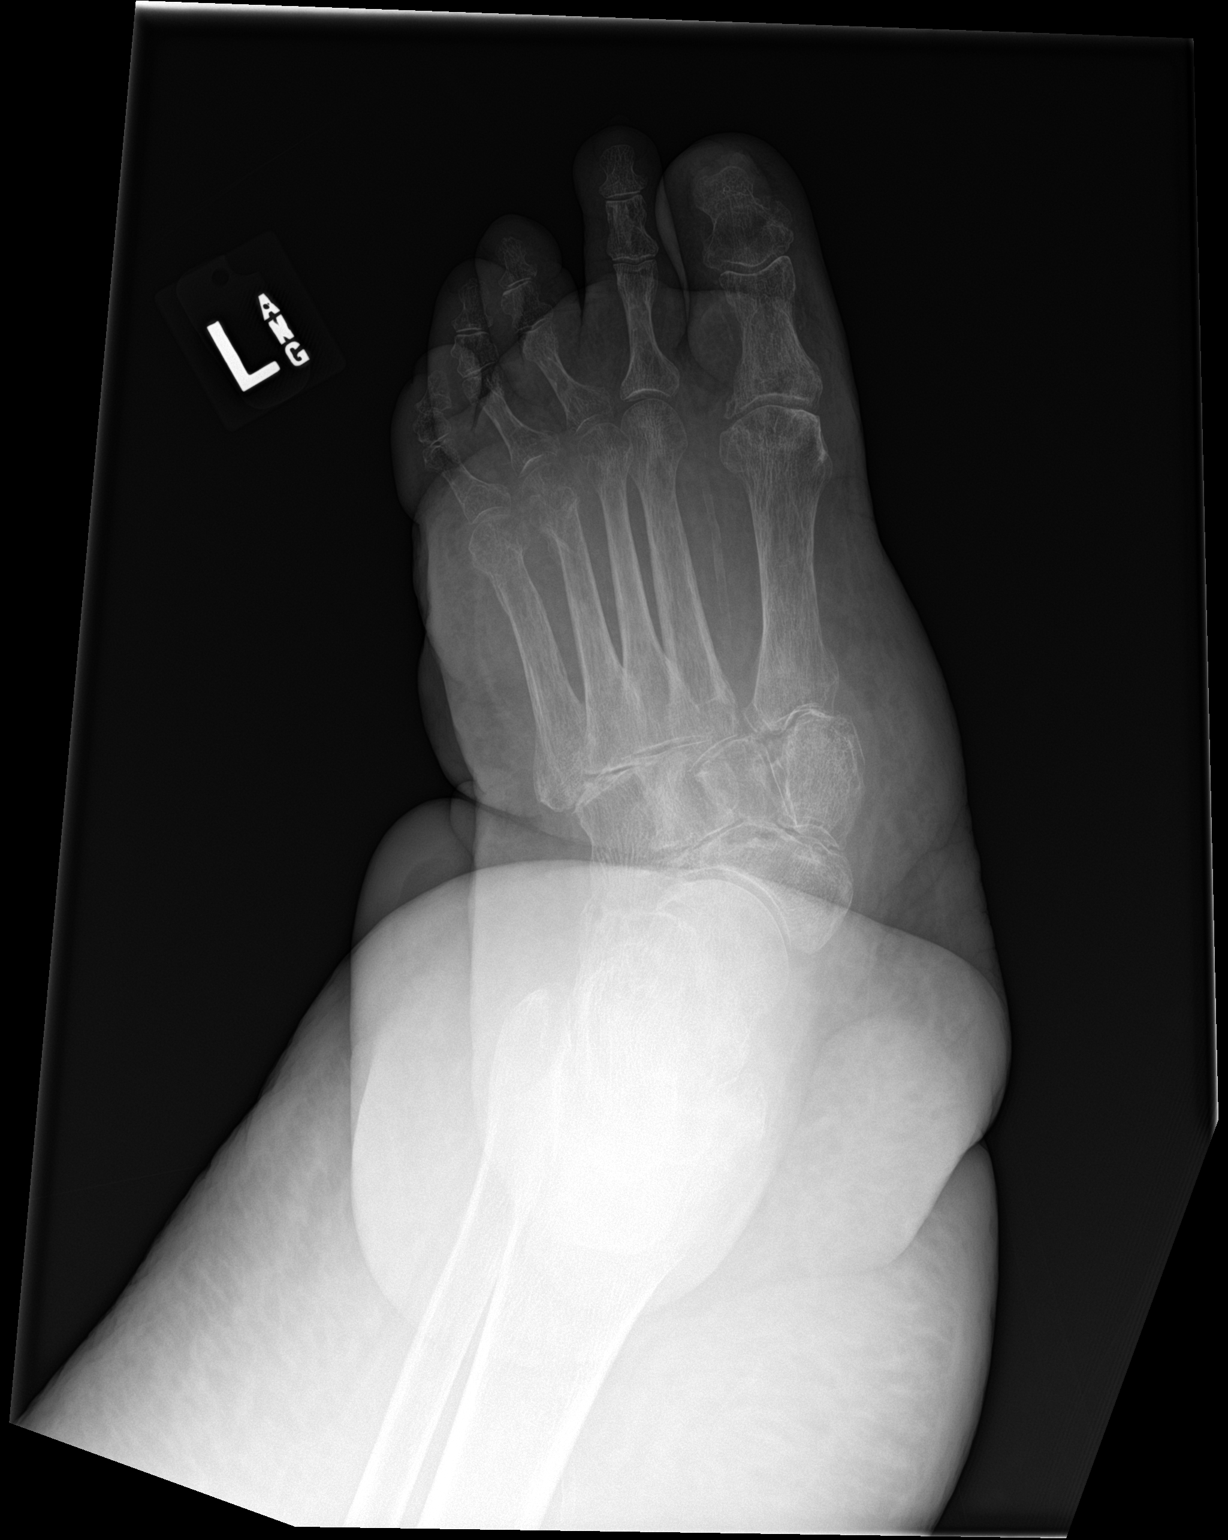

[foot obl]
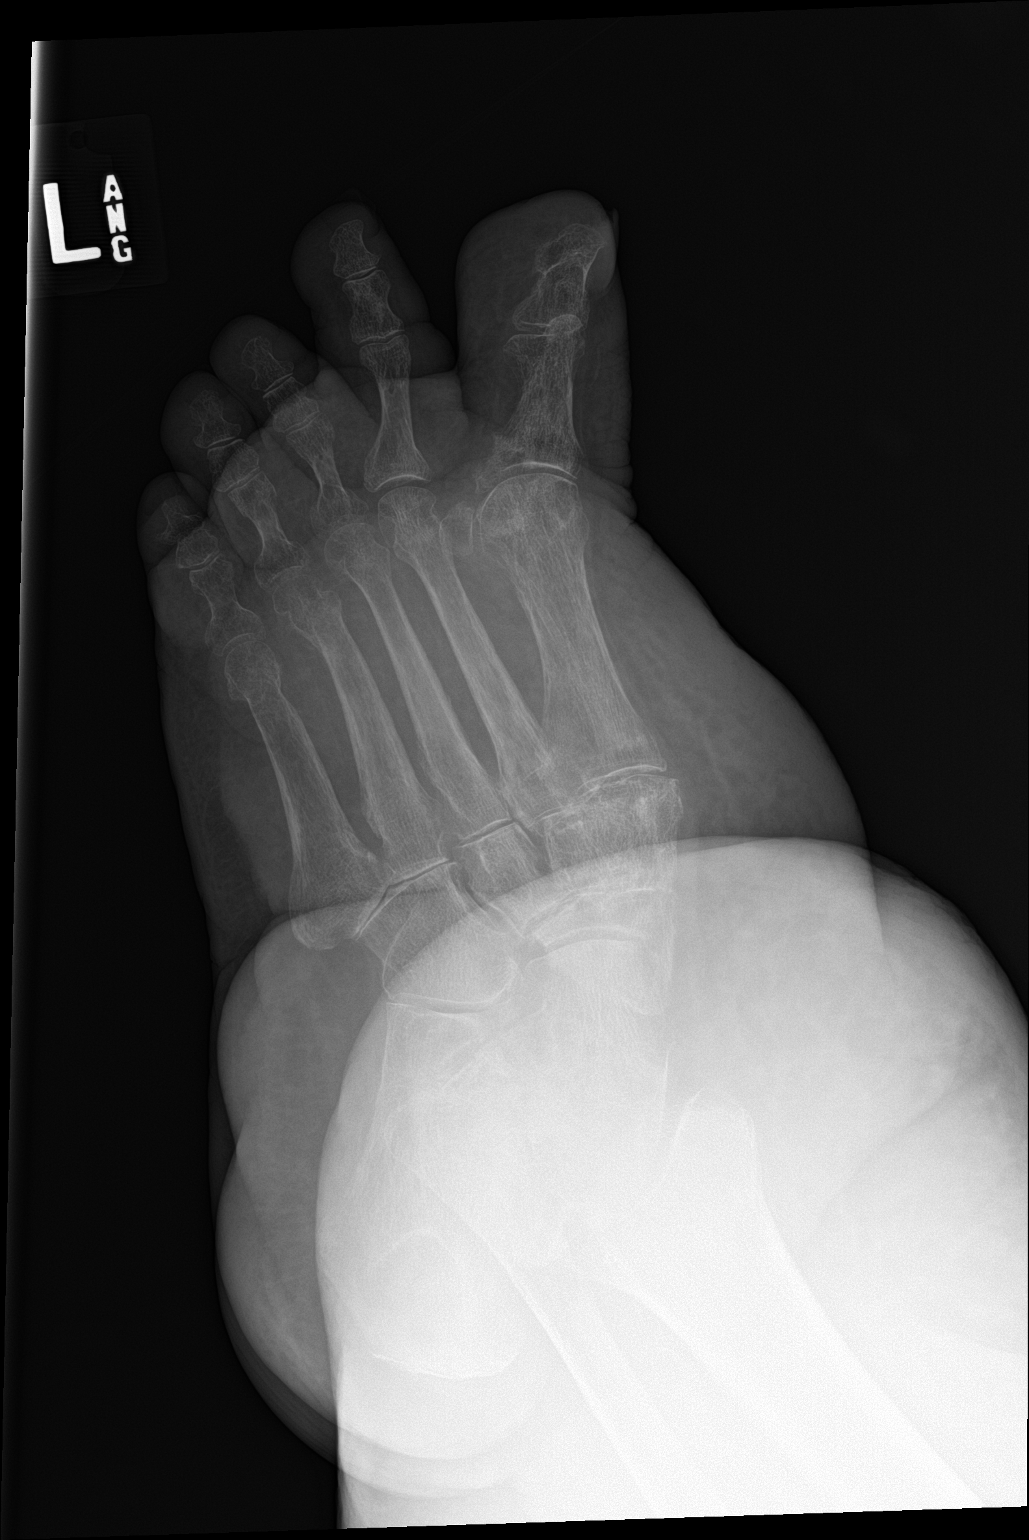

[foot lat]
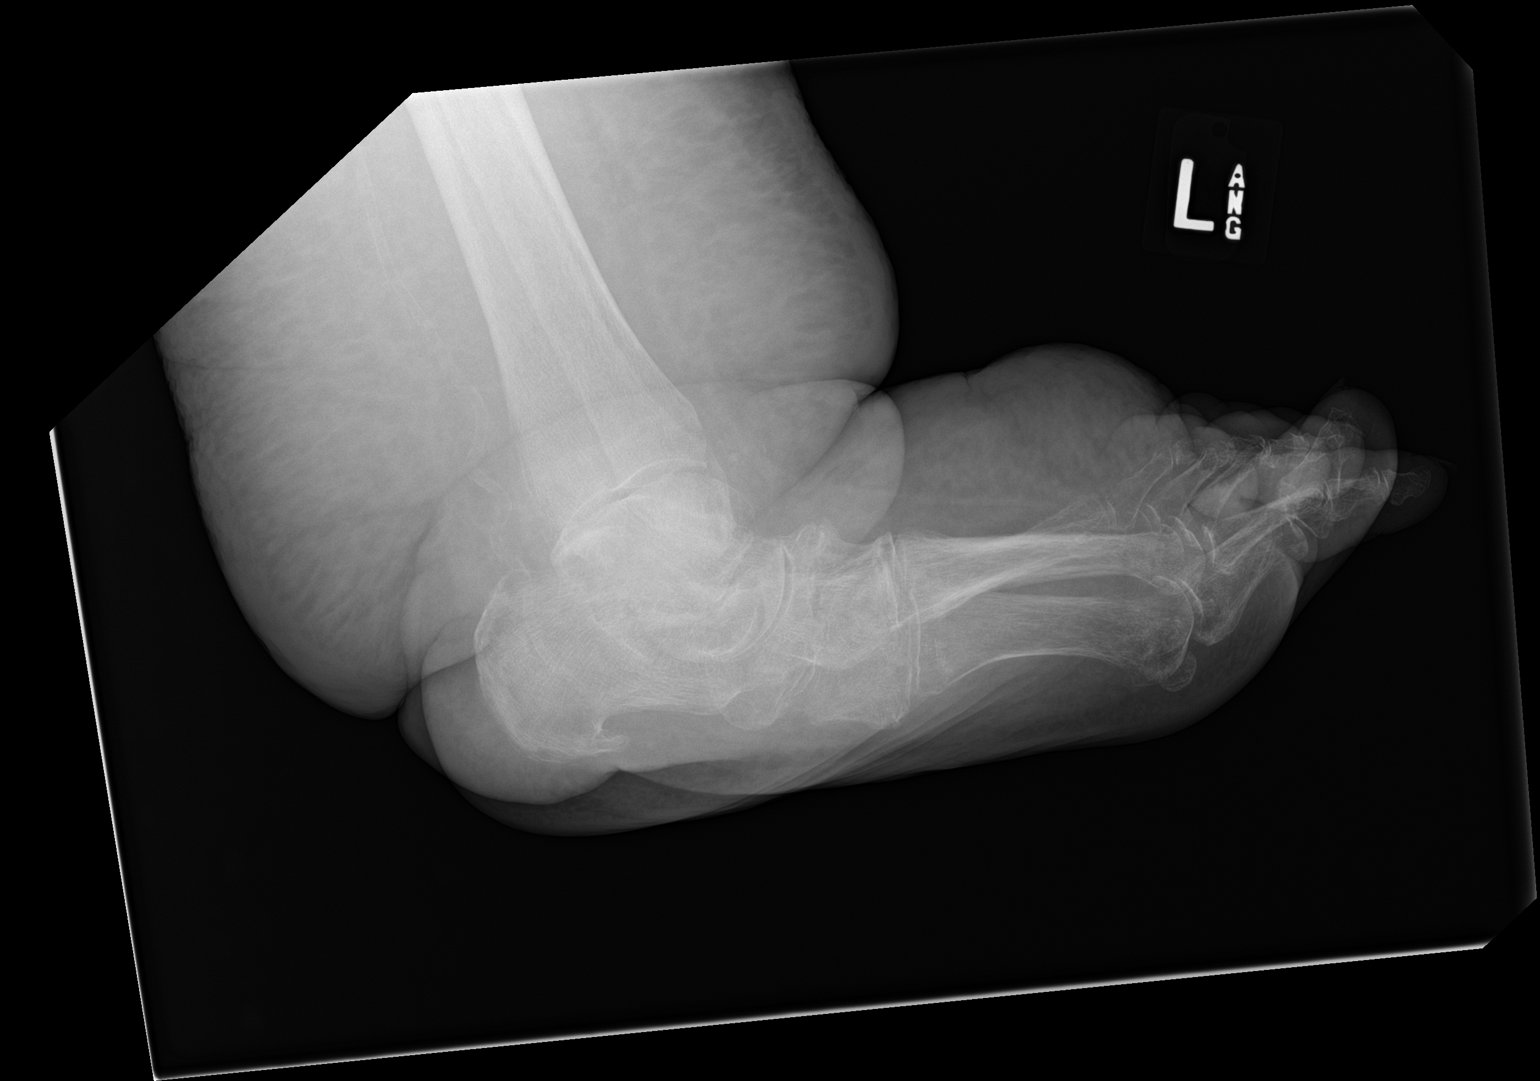

[3 of 3 positions shown; findings below may reference images not displayed]

FINDINGS: Osteopenia. Mild irregularity of the lateral aspect of the base of
the first proximal phalanx consistent with reported healing
fracture. No additional acute fracture or dislocation noted. Contour
deformities of the heads of the fourth and fifth metatarsals
consistent with the sequela of prior fractures. Vascular
calcifications. Pes planus. Midfoot degenerative changes. Soft
tissue edema.
IMPRESSION: Healing fractures of the base of the first proximal phalanx and the
head of fourth and fifth metatarsals.

## 2021-10-12 ENCOUNTER — Encounter (HOSPITAL_COMMUNITY): Payer: Self-pay

## 2021-10-12 ENCOUNTER — Other Ambulatory Visit: Payer: Self-pay

## 2021-10-12 ENCOUNTER — Emergency Department (HOSPITAL_COMMUNITY): Payer: Medicare HMO

## 2021-10-12 ENCOUNTER — Emergency Department (HOSPITAL_COMMUNITY)
Admission: EM | Admit: 2021-10-12 | Discharge: 2021-10-12 | Disposition: A | Discharge status: Home / Self Care | Payer: Medicare HMO | Attending: Emergency Medicine | Admitting: Emergency Medicine

## 2021-10-12 DIAGNOSIS — I129 Hypertensive chronic kidney disease with stage 1 through stage 4 chronic kidney disease, or unspecified chronic kidney disease: Secondary | ICD-10-CM | POA: Diagnosis not present

## 2021-10-12 DIAGNOSIS — J029 Acute pharyngitis, unspecified: Secondary | ICD-10-CM | POA: Diagnosis not present

## 2021-10-12 DIAGNOSIS — I482 Chronic atrial fibrillation, unspecified: Secondary | ICD-10-CM | POA: Insufficient documentation

## 2021-10-12 DIAGNOSIS — Z950 Presence of cardiac pacemaker: Secondary | ICD-10-CM | POA: Diagnosis not present

## 2021-10-12 DIAGNOSIS — Z7901 Long term (current) use of anticoagulants: Secondary | ICD-10-CM | POA: Diagnosis not present

## 2021-10-12 DIAGNOSIS — R5381 Other malaise: Secondary | ICD-10-CM | POA: Diagnosis not present

## 2021-10-12 DIAGNOSIS — R059 Cough, unspecified: Secondary | ICD-10-CM | POA: Diagnosis not present

## 2021-10-12 DIAGNOSIS — R509 Fever, unspecified: Secondary | ICD-10-CM | POA: Diagnosis not present

## 2021-10-12 DIAGNOSIS — N1832 Chronic kidney disease, stage 3b: Secondary | ICD-10-CM | POA: Insufficient documentation

## 2021-10-12 DIAGNOSIS — U071 COVID-19: Secondary | ICD-10-CM | POA: Diagnosis not present

## 2021-10-12 DIAGNOSIS — E039 Hypothyroidism, unspecified: Secondary | ICD-10-CM | POA: Diagnosis not present

## 2021-10-12 DIAGNOSIS — R0902 Hypoxemia: Secondary | ICD-10-CM | POA: Diagnosis not present

## 2021-10-12 DIAGNOSIS — R0989 Other specified symptoms and signs involving the circulatory and respiratory systems: Secondary | ICD-10-CM | POA: Diagnosis not present

## 2021-10-12 DIAGNOSIS — Z96611 Presence of right artificial shoulder joint: Secondary | ICD-10-CM | POA: Diagnosis not present

## 2021-10-12 DIAGNOSIS — Z853 Personal history of malignant neoplasm of breast: Secondary | ICD-10-CM | POA: Insufficient documentation

## 2021-10-12 DIAGNOSIS — I517 Cardiomegaly: Secondary | ICD-10-CM | POA: Diagnosis not present

## 2021-10-12 DIAGNOSIS — Z79899 Other long term (current) drug therapy: Secondary | ICD-10-CM | POA: Diagnosis not present

## 2021-10-12 LAB — CBC WITH DIFFERENTIAL/PLATELET
Abs Immature Granulocytes: 0.02 10*3/uL (ref 0.00–0.07)
Basophils Absolute: 0 10*3/uL (ref 0.0–0.1)
Basophils Relative: 1 %
Eosinophils Absolute: 0 10*3/uL (ref 0.0–0.5)
Eosinophils Relative: 1 %
HCT: 31 % — ABNORMAL LOW (ref 36.0–46.0)
Hemoglobin: 9.8 g/dL — ABNORMAL LOW (ref 12.0–15.0)
Immature Granulocytes: 0 %
Lymphocytes Relative: 28 %
Lymphs Abs: 1.3 10*3/uL (ref 0.7–4.0)
MCH: 31.4 pg (ref 26.0–34.0)
MCHC: 31.6 g/dL (ref 30.0–36.0)
MCV: 99.4 fL (ref 80.0–100.0)
Monocytes Absolute: 0.9 10*3/uL (ref 0.1–1.0)
Monocytes Relative: 20 %
Neutro Abs: 2.3 10*3/uL (ref 1.7–7.7)
Neutrophils Relative %: 50 %
Platelets: 158 10*3/uL (ref 150–400)
RBC: 3.12 MIL/uL — ABNORMAL LOW (ref 3.87–5.11)
RDW: 14.6 % (ref 11.5–15.5)
WBC: 4.5 10*3/uL (ref 4.0–10.5)
nRBC: 0 % (ref 0.0–0.2)

## 2021-10-12 LAB — BASIC METABOLIC PANEL
Anion gap: 7 (ref 5–15)
BUN: 23 mg/dL (ref 8–23)
CO2: 28 mmol/L (ref 22–32)
Calcium: 9.3 mg/dL (ref 8.9–10.3)
Chloride: 98 mmol/L (ref 98–111)
Creatinine, Ser: 1.58 mg/dL — ABNORMAL HIGH (ref 0.44–1.00)
GFR, Estimated: 34 mL/min — ABNORMAL LOW (ref >60)
Glucose, Bld: 97 mg/dL (ref 70–99)
Potassium: 3.6 mmol/L (ref 3.5–5.1)
Sodium: 133 mmol/L — ABNORMAL LOW (ref 135–145)

## 2021-10-12 LAB — RESP PANEL BY RT-PCR (FLU A&B, COVID) ARPGX2
Influenza A by PCR: NEGATIVE
Influenza B by PCR: NEGATIVE
SARS Coronavirus 2 by RT PCR: POSITIVE — AB

## 2021-10-12 MED ORDER — MOLNUPIRAVIR EUA 200MG CAPSULE: 4.0000 | ORAL_CAPSULE | Freq: Two times a day (BID) | ORAL | 0 refills | Status: AC

## 2021-10-12 NOTE — ED Triage Notes (Signed)
Pt BIB [facility] from facility with complaints of fever x 1 day.

## 2021-10-12 NOTE — ED Provider Notes (Signed)
[facility] DEPT Provider Note   CSN: [id number] Arrival date & time: 10/12/21  0235     History Chief Complaint  Patient presents with   Fever    Katelyn Jackson is a 75 y.o. female.  Patient comes to the emergency department for evaluation of fever, cough and sneezing.  Symptoms present for 1 day.  Patient does not feel any significant shortness of breath.  She has not experiencing chest pain.  No nausea, vomiting, diarrhea, abdominal pain.  She denies urinary frequency, dysuria, back and flank pain.      Past Medical History:  Diagnosis Date   Arthritis    Cancer [facility])    breast cancer   Chronic atrial fibrillation ([place])    Chronic kidney disease    sees Dr Florene Glen   Colonization with VRE (vancomycin-resistant enterococcus) 09/22/2021   Cramps, muscle, general    Dry skin 09/22/2021   Dyspnea on exertion    Dysrhythmia    a-fib,    ESBL E. coli carrier 09/22/2021   GERD (gastroesophageal reflux disease)    Headache    Herpes labialis 08/19/2021   Hyperlipidemia    Hypertension    Hypothyroidism    Lymphedema    Moderate to severe pulmonary hypertension ([place])    Obesity    Osteoarthritis of right knee 08/19/2021   Personal history of radiation therapy    Pneumonia 12/2020   Presence of permanent cardiac pacemaker 01/21/2021   for bradycardia    Syncope 12/2020   needed a pacemaker    Patient Active Problem List   Diagnosis Date Noted   Dry skin 09/22/2021   Colonization with VRE (vancomycin-resistant enterococcus) 09/22/2021   Osteoarthritis of right knee 08/19/2021   Herpes labialis 08/19/2021   Decompensated hepatic cirrhosis ([place]) 07/22/2021   Abscess    Prosthetic joint infection ([place])    Abscess of right shoulder 07/21/2021   History of pacemaker 07/21/2021   Pressure injury of skin 07/03/2021   Hypothyroidism    Cellulitis    Postoperative anemia due to acute blood loss 03/19/2021   Dyspnea 03/19/2021    Symptomatic anemia 03/19/2021   Status post right shoulder hemiarthroplasty 03/17/2021   Bilateral pain of leg and foot 10/08/2020   Abscess of left thumb 06/17/2020   Cellulitis and abscess of left leg 06/17/2020   Herpes zoster 02/18/2020   GERD (gastroesophageal reflux disease) 11/25/2019   Wheezing 10/26/2017   Sciatica of left side 08/29/2017   Morbid obesity ([place]) 07/11/2017   Hyperglycemia 07/11/2017   Low back pain 06/19/2017   Acute renal failure superimposed on stage 3b chronic kidney disease ([place]) 06/19/2017   Hypokalemia 06/19/2017   Acute lower UTI 06/19/2017   Anemia of chronic disease 02/16/2017   Malignant neoplasm of upper-outer quadrant of left breast in female, estrogen receptor positive ([place]) 02/10/2017   Essential hypertension 04/05/2016   Dyslipidemia 04/05/2016   Pulmonary hypertension ([place]) 04/05/2016   Lymphedema 04/05/2016    Past Surgical History:  Procedure Laterality Date   APPENDECTOMY     BREAST BIOPSY Left 2018   BREAST LUMPECTOMY Left 04/04/2017   x3   BREAST LUMPECTOMY WITH NEEDLE LOCALIZATION AND AXILLARY SENTINEL LYMPH NODE BX Left 04/04/2017   Procedure: BREAST LUMPECTOMY WITH NEEDLE LOCALIZATION x3 AND AXILLARY SENTINEL LYMPH NODE BX;  Surgeon: [name], MD;  Location: [place];  Service: General;  Laterality: Left;   CATARACT EXTRACTION W/ INTRAOCULAR LENS  IMPLANT, BILATERAL Bilateral 2018   COLONOSCOPY  EYE SURGERY     GLAUCOMA SURGERY Bilateral 2018   HARDWARE REMOVAL Right 07/26/2021   Procedure: RIGHT SHOULDER HARDWARE REMOVAL WITH WASHOUT;  Surgeon: Hiram Gash, MD;  Location: [facility];  Service: Orthopedics;  Laterality: Right;   INSERT / REPLACE / REMOVE PACEMAKER  01/21/2021   IR FLUORO GUIDE CV LINE LEFT  07/29/2021   IR REMOVAL TUN CV CATH W/O FL  09/07/2021   IR US GUIDE BX ASP/DRAIN  07/16/2021   RE-EXCISION OF BREAST CANCER,SUPERIOR MARGINS Left 04/26/2017   Procedure: RE-EXCISION OF LEFT BREAST CANCER;  Surgeon: [name], MD;  Location: [place];  Service: General;  Laterality: Left;   SHOULDER HEMI-ARTHROPLASTY Right 03/17/2021   Procedure: SHOULDER HEMI-ARTHROPLASTY;  Surgeon: Hiram Gash, MD;  Location: [facility];  Service: Orthopedics;  Laterality: Right;     OB History   No obstetric history on file.     Family History  Problem Relation Age of Onset   CAD Mother 69   Stroke Mother 61       hemorr CVA   COPD Father 17   Cancer Neg Hx     Social History   Tobacco Use   Smoking status: Never   Smokeless tobacco: Never  Vaping Use   Vaping Use: Never used  Substance Use Topics   Alcohol use: No    Alcohol/week: 0.0 standard drinks   Drug use: No    Home Medications Prior to Admission medications   Medication Sig Start Date End Date Taking? Authorizing Provider  acetaminophen (TYLENOL) 325 MG tablet Take 2 tablets (650 mg total) by mouth every 6 (six) hours as needed for mild pain or moderate pain (or Fever >/= 101). 07/30/21   Hongalgi, Lenis Dickinson, MD  allopurinol (ZYLOPRIM) 100 MG tablet Take 1 tablet (100 mg total) by mouth daily. 07/30/21   Hongalgi, Lenis Dickinson, MD  apixaban (ELIQUIS) 5 MG TABS tablet Take 1 tablet (5 mg total) by mouth 2 (two) times daily. 07/30/21   Hongalgi, Lenis Dickinson, MD  bisacodyl (DULCOLAX) 10 MG suppository Place 10 mg rectally daily as needed for moderate constipation.    [provider]  carvedilol (COREG) 6.25 MG tablet Take 1 tablet (6.25 mg total) by mouth 2 (two) times daily. 07/30/21 07/30/22  Hongalgi, Lenis Dickinson, MD  cefdinir (OMNICEF) 300 MG capsule Take 1 capsule (300 mg total) by mouth 2 (two) times daily. Patient not taking: Reported on 09/10/2021 08/19/21   Tommy Medal, Lavell Islam, MD  Cholecalciferol (VITAMIN D3) 2000 units capsule Take 1 capsule (2,000 Units total) by mouth daily. 07/11/17   Plotnikov, [name], MD  Cyanocobalamin (VITAMIN B-12 PO) Take 2,000 mcg by mouth daily.    [provider]  diclofenac Sodium (VOLTAREN) 1 % GEL APPLY FOUR  GRAMS FOUR TIMES DAILY AS NEEDED Patient taking differently: Apply 4 g topically 4 (four) times daily as needed (pain). 06/11/21   Plotnikov, [name], MD  docusate sodium (COLACE) 100 MG capsule Take 1 capsule (100 mg total) by mouth 2 (two) times daily. 07/30/21   Hongalgi, Lenis Dickinson, MD  doxycycline (VIBRA-TABS) 100 MG tablet Take 1 tablet (100 mg total) by mouth 2 (two) times daily. Patient not taking: No sig reported 08/19/21   Tommy Medal, Lavell Islam, MD  lactulose [facility]) 10 GM/15ML solution Take 15 mLs (10 g total) by mouth 3 (three) times daily. 07/30/21   Hongalgi, Lenis Dickinson, MD  levothyroxine (SYNTHROID) 50 MCG tablet Take 1 tablet (50 mcg total)  by mouth daily before breakfast. Overdue for Annual appt must see provider for future refills 02/09/21   Plotnikov, [name], MD  lipase/protease/amylase (CREON) 12000-38000 units CPEP capsule Take 1 capsule (12,000 Units total) by mouth daily as needed (stomach problems). 10/08/20   Plotnikov, [name], MD  magnesium oxide (MAG-OX) 400 MG tablet Take 400 mg by mouth 2 (two) times daily.    [provider]  methocarbamol (ROBAXIN) 500 MG tablet Take 1 tablet (500 mg total) by mouth every 8 (eight) hours as needed for muscle spasms. 03/19/21   McBane, Maylene Roes, PA-C  omeprazole (PRILOSEC) 40 MG capsule Take 1 capsule (40 mg total) by mouth daily. Overdue for Annual appt must see provider for future refills Patient taking differently: Take 40 mg by mouth daily as needed (acid reflux). Overdue for Annual appt must see provider for future refills 10/08/20   Plotnikov, [name], MD  OVER THE COUNTER MEDICATION Apply 1 application topically daily as needed (apply to buttocks  as needed for irritaiton). Intensive Skin Care Therapy    [provider]  oxyCODONE (OXY IR/ROXICODONE) 5 MG immediate release tablet Take 1 pills every 6 hrs as needed for severe pain 07/28/21   McBane, Maylene Roes, PA-C  polyethylene glycol (MIRALAX / GLYCOLAX) 17 g  packet Take 17 g by mouth 2 (two) times daily.    [provider]  polyvinyl alcohol (LIQUIFILM TEARS) 1.4 % ophthalmic solution Place 1 drop into both eyes as needed for dry eyes. 07/30/21   Hongalgi, Lenis Dickinson, MD  potassium chloride (KLOR-CON) 8 MEQ tablet Take 1 tablet (8 mEq total) by mouth daily. 10/08/20   Plotnikov, [name], MD  pravastatin (PRAVACHOL) 20 MG tablet Take 1 tablet (20 mg total) by mouth daily at 6 PM. 07/30/21   Hongalgi, Lenis Dickinson, MD  rifaximin (XIFAXAN) 550 MG TABS tablet Take 1 tablet (550 mg total) by mouth 2 (two) times daily. 07/30/21   Hongalgi, Lenis Dickinson, MD  sennosides-docusate sodium (SENOKOT-S) 8.6-50 MG tablet Take 1 tablet by mouth daily.    [provider]  simethicone (MYLICON) 40 DD/2.2GU drops Take 0.3 mLs (20 mg total) by mouth 4 (four) times daily as needed for flatulence. 07/30/21   Hongalgi, Lenis Dickinson, MD  tamoxifen (NOLVADEX) 20 MG tablet Take 1 tablet (20 mg total) by mouth daily. Will take after finishing Exemestane 07/30/21   Modena Jansky, MD  torsemide (DEMADEX) 20 MG tablet Take 1 tablet (20 mg total) by mouth daily. 07/30/21   Modena Jansky, MD    Allergies    Chlorhexidine gluconate and Penicillins  Review of Systems   Review of Systems  Constitutional:  Positive for fever.  HENT:  Positive for sneezing.   Respiratory:  Positive for cough.   All other systems reviewed and are negative.  Physical Exam Updated Vital Signs BP (!) 128/54   Pulse 65   Temp 98.2 F (36.8 C) (Oral)   Resp 19   LMP  (LMP Unknown)   SpO2 96%   Physical Exam Vitals and nursing note reviewed.  Constitutional:      General: She is not in acute distress.    Appearance: Normal appearance. She is well-developed.  HENT:     Head: Normocephalic and atraumatic.     Right Ear: Hearing normal.     Left Ear: Hearing normal.     Nose: Nose normal.  Eyes:     Conjunctiva/sclera: Conjunctivae normal.     Pupils: Pupils are  equal, round, and  reactive to light.  Cardiovascular:     Rate and Rhythm: Rhythm irregularly irregular.     Heart sounds: S1 normal and S2 normal. No murmur heard.   No friction rub. No gallop.  Pulmonary:     Effort: Pulmonary effort is normal. No respiratory distress.     Breath sounds: Normal breath sounds.  Chest:     Chest wall: No tenderness.  Abdominal:     General: Bowel sounds are normal.     Palpations: Abdomen is soft.     Tenderness: There is no abdominal tenderness. There is no guarding or rebound. Negative signs include Murphy's sign and McBurney's sign.     Hernia: No hernia is present.  Musculoskeletal:        General: Normal range of motion.     Cervical back: Normal range of motion and neck supple.  Skin:    General: Skin is warm and dry.     Findings: No rash.  Neurological:     Mental Status: She is alert and oriented to person, place, and time.     GCS: GCS eye subscore is 4. GCS verbal subscore is 5. GCS motor subscore is 6.     Cranial Nerves: No cranial nerve deficit.     Sensory: No sensory deficit.     Coordination: Coordination normal.  Psychiatric:        Speech: Speech normal.        Behavior: Behavior normal.        Thought Content: Thought content normal.    ED Results / Procedures / Treatments   Labs (all labs ordered are listed, but only abnormal results are displayed) Labs Reviewed  RESP PANEL BY RT-PCR (FLU A&B, COVID) ARPGX2 - Abnormal; Notable for the following components:      Result Value   SARS Coronavirus 2 by RT PCR POSITIVE (*)    All other components within normal limits  BASIC METABOLIC PANEL  CBC WITH DIFFERENTIAL/PLATELET    EKG None  Radiology DG Chest Port 1 View  Result Date: 10/12/2021 CLINICAL DATA:  Fever, cough. EXAM: PORTABLE CHEST 1 VIEW COMPARISON:  07/01/2021. FINDINGS: The heart is enlarged. There is atherosclerotic calcification of the aorta. The pulmonary vasculature is within normal limits. No consolidation, effusion, or  pneumothorax is seen. A dual lead pacemaker is present over the left chest. Surgical clips are noted in the right axilla. Destructive changes are present at the right glenohumeral joint and there has been interval removal of right shoulder arthroplasty hardware. IMPRESSION: Cardiomegaly with no acute process. Electronically Signed   By: Brett Fairy M.D.   On: 10/12/2021 04:43    Procedures Procedures   Medications Ordered in ED Medications - No data to display  ED Course  I have reviewed the triage vital signs and the nursing notes.  Pertinent labs & imaging results that were available during my care of the patient were reviewed by me and considered in my medical decision making (see chart for details).    MDM Rules/Calculators/A&P                           Patient presents to the emergency department for evaluation of 1 day of fever, cough and sneezing.  No associated shortness of breath.  Vital signs are normal at arrival.  She is afebrile, normal oxygen saturations.  She is breathing comfortably.  Chest x-ray was clear.  She did, however, test  positive for COVID.  She has not hypoxic, does not require hospitalization.  Do feel that she is somewhat high risk due to her age and therefore would recommend antiviral treatment.  Her GFR is 34.  Considered Paxlovid therapy, however with her reduced GFR and also being on Eliquis, this is a contraindication.  With her language barrier, would be concerning to try to get her to stop the Eliquis temporarily.  Will therefore prescribe her Molnupirovir.  Final Clinical Impression(s) / ED Diagnoses Final diagnoses:  COVID-19    Rx / DC Orders ED Discharge Orders     None        Keefer Soulliere, Gwenyth Allegra, MD 10/12/21 [phone]

## 2021-10-12 NOTE — ED Notes (Signed)
[facility] called for transport.  

## 2021-10-22 ENCOUNTER — Ambulatory Visit: Payer: Medicare HMO | Admitting: Infectious Disease

## 2021-11-03 ENCOUNTER — Telehealth: Payer: Self-pay | Admitting: Internal Medicine

## 2021-11-03 NOTE — Telephone Encounter (Signed)
..  Home Health Verbal Orders  Agency:    [facility]  Caller: (Contact and title)  Roselie Awkward  Call back #: [phone] Fax #:    Requesting OT/ PT/ Skilled nursing/ Social Work/ Speech:  Physical Therapy  Reason for Request:  Strengthening, balance, walking and mobility trainings  Frequency:  2 week x 3                     1 week x 2                      Twice a week for 2 weeks                      One time a week for 2 week   HH needs F2F w/in last 30 days

## 2021-11-03 NOTE — Telephone Encounter (Signed)
Ok Thx 

## 2021-11-04 ENCOUNTER — Telehealth: Payer: Self-pay | Admitting: Internal Medicine

## 2021-11-04 NOTE — Telephone Encounter (Signed)
Okay.  Thanks.

## 2021-11-04 NOTE — Telephone Encounter (Signed)
Heather from [facility] calling in  Katelyn Jackson she did nursing evaluation today & is requesting 2 addt nursing visits fpr disease management & skin care  Cb [phone]

## 2021-11-09 NOTE — Telephone Encounter (Signed)
Home Health verbal orders-caller/Agency:   [facility] number: [phone]  Requesting OT/PT/Skilled nursing/Social Work/Speech: OT  Reason: Patient's son declined ot eval 11-09-2021 today Son agreed to resume Friday 11-12-2021  Frequency: Verbal ok to evaluate 11-12-2021

## 2021-11-10 NOTE — Telephone Encounter (Signed)
Called Timmothy Sours there was no answer LMOM ok for verbal to resume on Friday 11/12/21.Marland KitchenJohny Chess

## 2021-11-12 NOTE — Telephone Encounter (Signed)
Verbal orders needed 1 times a week x 2weeks 0 times weeks x 1 1 time a week x 1 week  Transfer training  pain control Heart failure Education Exercise   Ok'd to receive orders from Dr. Scotty Court [phone]

## 2021-11-16 DIAGNOSIS — M19011 Primary osteoarthritis, right shoulder: Secondary | ICD-10-CM | POA: Diagnosis not present

## 2021-11-19 ENCOUNTER — Telehealth: Payer: Self-pay | Admitting: Internal Medicine

## 2021-11-19 NOTE — Telephone Encounter (Signed)
OT w/ [facility] informing provider of patient's significant weight gain  12-30/ 197 lb 1-2/ 206 lb 1-4/ 202.8 lb 1-5/ 203.4 lb 1-6/ 205 lb 1-9/ 205 lb 1-11/ 210 lb 1-13/ 213.2  Caller states patient has a slow edema increase in L. Leg, patient has no worsening breathing  Patient's next ov is on 11-25-2021

## 2021-11-25 ENCOUNTER — Ambulatory Visit (INDEPENDENT_AMBULATORY_CARE_PROVIDER_SITE_OTHER): Payer: Medicare HMO | Admitting: Internal Medicine

## 2021-11-25 ENCOUNTER — Encounter: Payer: Self-pay | Admitting: Internal Medicine

## 2021-11-25 ENCOUNTER — Other Ambulatory Visit: Payer: Self-pay

## 2021-11-25 VITALS — BP 148/70 | HR 57 | Temp 98.7°F | Ht 61.0 in | Wt 218.2 lb

## 2021-11-25 DIAGNOSIS — R0600 Dyspnea, unspecified: Secondary | ICD-10-CM | POA: Diagnosis not present

## 2021-11-25 DIAGNOSIS — R739 Hyperglycemia, unspecified: Secondary | ICD-10-CM

## 2021-11-25 DIAGNOSIS — I10 Essential (primary) hypertension: Secondary | ICD-10-CM | POA: Diagnosis not present

## 2021-11-25 DIAGNOSIS — K7460 Unspecified cirrhosis of liver: Secondary | ICD-10-CM | POA: Diagnosis not present

## 2021-11-25 DIAGNOSIS — I890 Lymphedema, not elsewhere classified: Secondary | ICD-10-CM | POA: Diagnosis not present

## 2021-11-25 DIAGNOSIS — E785 Hyperlipidemia, unspecified: Secondary | ICD-10-CM | POA: Diagnosis not present

## 2021-11-25 DIAGNOSIS — L02413 Cutaneous abscess of right upper limb: Secondary | ICD-10-CM

## 2021-11-25 LAB — COMPREHENSIVE METABOLIC PANEL
ALT: 13 U/L (ref 0–35)
AST: 20 U/L (ref 0–37)
Albumin: 3.5 g/dL (ref 3.5–5.2)
Alkaline Phosphatase: 121 U/L — ABNORMAL HIGH (ref 39–117)
BUN: 35 mg/dL — ABNORMAL HIGH (ref 6–23)
CO2: 34 mEq/L — ABNORMAL HIGH (ref 19–32)
Calcium: 9.5 mg/dL (ref 8.4–10.5)
Chloride: 97 mEq/L (ref 96–112)
Creatinine, Ser: 1.62 mg/dL — ABNORMAL HIGH (ref 0.40–1.20)
GFR: 30.79 mL/min — ABNORMAL LOW (ref >60.00)
Glucose, Bld: 95 mg/dL (ref 70–99)
Potassium: 3.5 mEq/L (ref 3.5–5.1)
Sodium: 137 mEq/L (ref 135–145)
Total Bilirubin: 0.8 mg/dL (ref 0.2–1.2)
Total Protein: 6.6 g/dL (ref 6.0–8.3)

## 2021-11-25 LAB — CBC WITH DIFFERENTIAL/PLATELET
Basophils Absolute: 0 10*3/uL (ref 0.0–0.1)
Basophils Relative: 0.9 % (ref 0.0–3.0)
Eosinophils Absolute: 0.2 10*3/uL (ref 0.0–0.7)
Eosinophils Relative: 5.4 % — ABNORMAL HIGH (ref 0.0–5.0)
HCT: 28.1 % — ABNORMAL LOW (ref 36.0–46.0)
Hemoglobin: 9 g/dL — ABNORMAL LOW (ref 12.0–15.0)
Lymphocytes Relative: 26.5 % (ref 12.0–46.0)
Lymphs Abs: 1.1 10*3/uL (ref 0.7–4.0)
MCHC: 32.2 g/dL (ref 30.0–36.0)
MCV: 96.8 fl (ref 78.0–100.0)
Monocytes Absolute: 0.6 10*3/uL (ref 0.1–1.0)
Monocytes Relative: 14.4 % — ABNORMAL HIGH (ref 3.0–12.0)
Neutro Abs: 2.2 10*3/uL (ref 1.4–7.7)
Neutrophils Relative %: 52.8 % (ref 43.0–77.0)
Platelets: 136 10*3/uL — ABNORMAL LOW (ref 150.0–400.0)
RBC: 2.9 Mil/uL — ABNORMAL LOW (ref 3.87–5.11)
RDW: 16 % — ABNORMAL HIGH (ref 11.5–15.5)
WBC: 4.1 10*3/uL (ref 4.0–10.5)

## 2021-11-25 LAB — URIC ACID: Uric Acid, Serum: 9.1 mg/dL — ABNORMAL HIGH (ref 2.4–7.0)

## 2021-11-25 LAB — HEMOGLOBIN A1C: Hgb A1c MFr Bld: 5 % (ref 4.6–6.5)

## 2021-11-25 LAB — T4, FREE: Free T4: 1.04 ng/dL (ref 0.60–1.60)

## 2021-11-25 LAB — LIPID PANEL
Cholesterol: 163 mg/dL (ref 0–200)
HDL: 77.5 mg/dL (ref >39.00)
LDL Cholesterol: 72 mg/dL (ref 0–99)
NonHDL: 85.11
Total CHOL/HDL Ratio: 2
Triglycerides: 66 mg/dL (ref 0.0–149.0)
VLDL: 13.2 mg/dL (ref 0.0–40.0)

## 2021-11-25 LAB — TSH: TSH: 3.9 u[IU]/mL (ref 0.35–5.50)

## 2021-11-25 MED ORDER — RIFAXIMIN 550 MG PO TABS: 550.0000 mg | ORAL_TABLET | Freq: Two times a day (BID) | ORAL | 1 refills | Status: DC

## 2021-11-25 MED ORDER — MAGNESIUM OXIDE 400 MG PO TABS: 400.0000 mg | ORAL_TABLET | Freq: Two times a day (BID) | ORAL | 3 refills | Status: DC

## 2021-11-25 MED ORDER — TAMOXIFEN CITRATE 20 MG PO TABS: 20.0000 mg | ORAL_TABLET | Freq: Every day | ORAL | 3 refills | Status: DC

## 2021-11-25 MED ORDER — PRAVASTATIN SODIUM 20 MG PO TABS: 20.0000 mg | ORAL_TABLET | Freq: Every day | ORAL | 3 refills | Status: DC

## 2021-11-25 MED ORDER — APIXABAN 5 MG PO TABS: 5.0000 mg | ORAL_TABLET | Freq: Two times a day (BID) | ORAL | 3 refills | Status: DC

## 2021-11-25 MED ORDER — LACTULOSE 10 GM/15ML PO SOLN: 10.0000 g | Freq: Three times a day (TID) | ORAL | 5 refills | Status: DC

## 2021-11-25 MED ORDER — CARVEDILOL 6.25 MG PO TABS: 6.2500 mg | ORAL_TABLET | Freq: Two times a day (BID) | ORAL | 3 refills | Status: DC

## 2021-11-25 MED ORDER — PANCRELIPASE (LIP-PROT-AMYL) 12000-38000 UNITS PO CPEP: 12000.0000 [IU] | ORAL_CAPSULE | Freq: Every day | ORAL | 3 refills | Status: DC | PRN

## 2021-11-25 MED ORDER — SENNA-DOCUSATE SODIUM 8.6-50 MG PO TABS: 1.0000 | ORAL_TABLET | Freq: Every day | ORAL | 3 refills | Status: DC

## 2021-11-25 MED ORDER — LEVOTHYROXINE SODIUM 50 MCG PO TABS: 50.0000 ug | ORAL_TABLET | Freq: Every day | ORAL | 3 refills | Status: DC

## 2021-11-25 MED ORDER — ALLOPURINOL 100 MG PO TABS: 100.0000 mg | ORAL_TABLET | Freq: Every day | ORAL | 3 refills | Status: DC

## 2021-11-25 MED ORDER — POTASSIUM CHLORIDE ER 8 MEQ PO TBCR: 8.0000 meq | EXTENDED_RELEASE_TABLET | Freq: Every day | ORAL | 3 refills | Status: DC

## 2021-11-25 MED ORDER — TORSEMIDE 100 MG PO TABS
100.0000 mg | ORAL_TABLET | Freq: Two times a day (BID) | ORAL | 3 refills | Status: DC
Start: 2021-11-25 — End: 2023-08-28

## 2021-11-25 MED ORDER — DOCUSATE SODIUM 100 MG PO CAPS: 100.0000 mg | ORAL_CAPSULE | Freq: Two times a day (BID) | ORAL | 3 refills | Status: DC

## 2021-11-25 MED ORDER — VITAMIN D3 50 MCG (2000 UT) PO CAPS: 2000.0000 [IU] | ORAL_CAPSULE | Freq: Every day | ORAL | 3 refills | Status: DC

## 2021-11-25 NOTE — Progress Notes (Signed)
Subjective:  Patient ID: Katelyn Jackson, female    DOB: February 21, 1946  Age: 76 y.o. MRN: [id number]  CC: No chief complaint on file.   HPI Monque Haggar presents for chronic lymphedema, worsening leg swelling and weight gain, hypertension, renal insufficiency.  Follow-up on recent COVID and remote right shoulder abscess.  Outpatient Medications Prior to Visit  Medication Sig Dispense Refill   acetaminophen (TYLENOL) 325 MG tablet Take 2 tablets (650 mg total) by mouth every 6 (six) hours as needed for mild pain or moderate pain (or Fever >/= 101).     bisacodyl (DULCOLAX) 10 MG suppository Place 10 mg rectally daily as needed for moderate constipation.     Cyanocobalamin (VITAMIN B-12 PO) Take 2,000 mcg by mouth daily.     diclofenac Sodium (VOLTAREN) 1 % GEL APPLY FOUR GRAMS FOUR TIMES DAILY AS NEEDED (Patient taking differently: Apply 4 g topically 4 (four) times daily as needed (pain).) 100 g 5   methocarbamol (ROBAXIN) 500 MG tablet Take 1 tablet (500 mg total) by mouth every 8 (eight) hours as needed for muscle spasms. 20 tablet 0   OVER THE COUNTER MEDICATION Apply 1 application topically daily as needed (apply to buttocks  as needed for irritaiton). Intensive Skin Care Therapy     oxyCODONE (OXY IR/ROXICODONE) 5 MG immediate release tablet Take 1 pills every 6 hrs as needed for severe pain 24 tablet 0   polyethylene glycol (MIRALAX / GLYCOLAX) 17 g packet Take 17 g by mouth 2 (two) times daily.     polyvinyl alcohol (LIQUIFILM TEARS) 1.4 % ophthalmic solution Place 1 drop into both eyes as needed for dry eyes.     simethicone (MYLICON) 40 GG/2.6RS drops Take 0.3 mLs (20 mg total) by mouth 4 (four) times daily as needed for flatulence.     allopurinol (ZYLOPRIM) 100 MG tablet Take 1 tablet (100 mg total) by mouth daily.     apixaban (ELIQUIS) 5 MG TABS tablet Take 1 tablet (5 mg total) by mouth 2 (two) times daily.     carvedilol (COREG) 6.25 MG tablet Take 1 tablet (6.25 mg total)  by mouth 2 (two) times daily.     Cholecalciferol (VITAMIN D3) 2000 units capsule Take 1 capsule (2,000 Units total) by mouth daily. 100 capsule 3   docusate sodium (COLACE) 100 MG capsule Take 1 capsule (100 mg total) by mouth 2 (two) times daily.     lactulose (CHRONULAC) 10 GM/15ML solution Take 15 mLs (10 g total) by mouth 3 (three) times daily.     levothyroxine (SYNTHROID) 50 MCG tablet Take 1 tablet (50 mcg total) by mouth daily before breakfast. Overdue for Annual appt must see provider for future refills 90 tablet 3   lipase/protease/amylase (CREON) 12000-38000 units CPEP capsule Take 1 capsule (12,000 Units total) by mouth daily as needed (stomach problems). 270 capsule 3   magnesium oxide (MAG-OX) 400 MG tablet Take 400 mg by mouth 2 (two) times daily.     potassium chloride (KLOR-CON) 8 MEQ tablet Take 1 tablet (8 mEq total) by mouth daily. 90 tablet 3   pravastatin (PRAVACHOL) 20 MG tablet Take 1 tablet (20 mg total) by mouth daily at 6 PM.     rifaximin (XIFAXAN) 550 MG TABS tablet Take 1 tablet (550 mg total) by mouth 2 (two) times daily.     sennosides-docusate sodium (SENOKOT-S) 8.6-50 MG tablet Take 1 tablet by mouth daily.     tamoxifen (NOLVADEX) 20 MG tablet Take 1 tablet (20  mg total) by mouth daily. Will take after finishing Exemestane     torsemide (DEMADEX) 20 MG tablet Take 1 tablet (20 mg total) by mouth daily.     cefdinir (OMNICEF) 300 MG capsule Take 1 capsule (300 mg total) by mouth 2 (two) times daily. (Patient not taking: Reported on 09/10/2021) 60 capsule 11   doxycycline (VIBRA-TABS) 100 MG tablet Take 1 tablet (100 mg total) by mouth 2 (two) times daily. (Patient not taking: No sig reported) 60 tablet 11   omeprazole (PRILOSEC) 40 MG capsule Take 1 capsule (40 mg total) by mouth daily. Overdue for Annual appt must see provider for future refills (Patient taking differently: Take 40 mg by mouth daily as needed (acid reflux). Overdue for Annual appt must see provider  for future refills) 90 capsule 3   No facility-administered medications prior to visit.    ROS: Review of Systems  Constitutional:  Positive for fatigue and unexpected weight change. Negative for activity change, appetite change and chills.  HENT:  Negative for congestion, mouth sores and sinus pressure.   Eyes:  Negative for visual disturbance.  Respiratory:  Negative for cough and chest tightness.   Cardiovascular:  Positive for leg swelling.  Gastrointestinal:  Negative for abdominal pain, nausea and vomiting.  Genitourinary:  Negative for difficulty urinating, frequency and vaginal pain.  Musculoskeletal:  Positive for arthralgias and gait problem. Negative for back pain.  Skin:  Negative for pallor, rash and wound.  Neurological:  Positive for weakness. Negative for dizziness, tremors, numbness and headaches.  Hematological:  Does not bruise/bleed easily.  Psychiatric/Behavioral:  Negative for confusion, decreased concentration, sleep disturbance and suicidal ideas. The patient is not nervous/anxious.    Objective:  BP (!) 148/70 (BP Location: Left Arm, Patient Position: Sitting, Cuff Size: Large)    Pulse (!) 57    Temp 98.7 F (37.1 C) (Oral)    Ht 5\' 1"  (1.549 m)    Wt 218 lb 3 oz (99 kg)    LMP  (LMP Unknown)    SpO2 97%    BMI 41.23 kg/m   BP Readings from Last 3 Encounters:  11/25/21 (!) 148/70  10/12/21 (!) 147/71  09/22/21 117/81    Wt Readings from Last 3 Encounters:  11/25/21 218 lb 3 oz (99 kg)  07/28/21 238 lb 5.1 oz (108.1 kg)  06/28/21 250 lb (113.4 kg)    Physical Exam Constitutional:      General: She is not in acute distress.    Appearance: She is well-developed. She is obese.  HENT:     Head: Normocephalic.     Right Ear: External ear normal.     Left Ear: External ear normal.     Nose: Nose normal.  Eyes:     General:        Right eye: No discharge.        Left eye: No discharge.     Conjunctiva/sclera: Conjunctivae normal.     Pupils: Pupils  are equal, round, and reactive to light.  Neck:     Thyroid: No thyromegaly.     Vascular: No JVD.     Trachea: No tracheal deviation.  Cardiovascular:     Rate and Rhythm: Normal rate and regular rhythm.     Heart sounds: Normal heart sounds.  Pulmonary:     Effort: No respiratory distress.     Breath sounds: No stridor. No wheezing.  Abdominal:     General: Bowel sounds are normal. There is no  distension.     Palpations: Abdomen is soft. There is no mass.     Tenderness: There is no abdominal tenderness. There is no guarding or rebound.  Musculoskeletal:        General: Tenderness present.     Cervical back: Normal range of motion and neck supple. No rigidity.  Lymphadenopathy:     Cervical: No cervical adenopathy.  Skin:    Findings: No erythema or rash.  Neurological:     Mental Status: She is oriented to person, place, and time.     Cranial Nerves: No cranial nerve deficit.     Motor: Weakness present. No abnormal muscle tone.     Coordination: Coordination abnormal.     Gait: Gait abnormal.     Deep Tendon Reflexes: Reflexes normal.  Psychiatric:        Behavior: Behavior normal.        Thought Content: Thought content normal.        Judgment: Judgment normal.  The patient is in a wheelchair She is morbidly obese Severe massive nonpitting edema on both lower extremities    A total time of 46 minutes was spent preparing to see the patient, reviewing tests, x-rays, operative reports and other medical records.  Also, obtaining history and performing comprehensive physical exam.  Additionally, counseling the patient regarding the above listed issues.   Finally, documenting clinical information in the health records, coordination of care, educating the patient regarding edema, renal insufficiency. It is a complex case.   Lab Results  Component Value Date   WBC 4.1 11/25/2021   HGB 9.0 (L) 11/25/2021   HCT 28.1 (L) 11/25/2021   PLT 136.0 (L) 11/25/2021   GLUCOSE 95  11/25/2021   CHOL 163 11/25/2021   TRIG 66.0 11/25/2021   HDL 77.50 11/25/2021   LDLCALC 72 11/25/2021   ALT 13 11/25/2021   AST 20 11/25/2021   NA 137 11/25/2021   K 3.5 11/25/2021   CL 97 11/25/2021   CREATININE 1.62 (H) 11/25/2021   BUN 35 (H) 11/25/2021   CO2 34 (H) 11/25/2021   TSH 3.90 11/25/2021   INR 2.9 (H) 07/30/2021   HGBA1C 5.0 11/25/2021    DG Chest Port 1 View  Result Date: 10/12/2021 CLINICAL DATA:  Fever, cough. EXAM: PORTABLE CHEST 1 VIEW COMPARISON:  07/01/2021. FINDINGS: The heart is enlarged. There is atherosclerotic calcification of the aorta. The pulmonary vasculature is within normal limits. No consolidation, effusion, or pneumothorax is seen. A dual lead pacemaker is present over the left chest. Surgical clips are noted in the right axilla. Destructive changes are present at the right glenohumeral joint and there has been interval removal of right shoulder arthroplasty hardware. IMPRESSION: Cardiomegaly with no acute process. Electronically Signed   By: Brett Fairy M.D.   On: 10/12/2021 04:43    Assessment & Plan:   Problem List Items Addressed This Visit     Essential hypertension (Chronic)   Relevant Medications   apixaban (ELIQUIS) 5 MG TABS tablet   carvedilol (COREG) 6.25 MG tablet   pravastatin (PRAVACHOL) 20 MG tablet   torsemide (DEMADEX) 100 MG tablet   Other Relevant Orders   Uric acid (Completed)   Abscess of right shoulder    Abscess of right shoulder 9/23 - s/p right shoulder hemiarthroplasty in May 2022 - s/p aspiration of shoulder on 07/16/21 but appears fluid too thick for likely a good sample and pus was noted during procedure -S/p I&D with resection arthroplasty and cultures taken  in the operating room on 9/19.  Wound VAC removed 9/22. - ID has started her on IV vancomycin, added ceftriaxone.  Total 6 weeks of antibiotics as per OPAT protocol.    Hospital records reviewed      Dyslipidemia - Primary   Relevant Medications    pravastatin (PRAVACHOL) 20 MG tablet   Other Relevant Orders   TSH (Completed)   T4, free (Completed)   Lipid panel (Completed)   Dyspnea   Relevant Orders   CBC with Differential/Platelet (Completed)   Comprehensive metabolic panel (Completed)   TSH (Completed)   T4, free (Completed)   Hemoglobin A1c (Completed)   Lipid panel (Completed)   Uric acid (Completed)   Hyperglycemia   Liver cirrhosis (HCC)    Decompensated hepatic cirrhosis -likely NASH - despite new diagnosis, she's had this a while; RUQ u/s on 9/14 shows nodular liver consistent with diagnosis; also some GB sludge but LFT pattern not consistent with obstruction  - she has had elevated INR despite coumadin washout with ongoing elevated LFTs which may have some confounding from underlying infection; ammonia also upper limit normal on 9/9 and she's responded well to lactulose (rifaximin also started per GI) - GI consulted for further assistance with evaluation - likely NAFLD; no history of etoh use that would explain - further autoimmune workup also commenced.  Only significant for elevated alpha-1 antitrypsin levels.  ANA, antimitochondrial antibody, c-ANCA, p-ANCA and atypical p-ANCA titer: Negative.  HBsAg and HCV antibody negative. - renal function improved with IVF (was overdiuresis with torsemide) - As per [facility] will sign off on 07/23/2021: Cirrhosis on ultrasound, likely NASH (no ascites, no prior history of variceal bleeding.  Negative hepatitis panel.  Normal ceruloplasmin, alpha-1 antitrypsin.  Hepatic encephalopathy with asterixis-on lactulose and Xifaxan and responding well. -Improved and stable.  Continue meds as noted below.  Could consider outpatient GI follow-up as deemed necessary. Records reviewed      Lymphedema    Worse.  Chronic and severe.  Will increase torsemide to 100 mg twice a day.  Follow-up with nephrology.  Low-salt diet      Morbid obesity ([place])    On chronic and refractory.  Diet  discussed      Relevant Medications   magnesium oxide (MAG-OX) 400 MG tablet      Meds ordered this encounter  Medications   DISCONTD: allopurinol (ZYLOPRIM) 100 MG tablet    Sig: Take 1 tablet (100 mg total) by mouth daily.    Dispense:  90 tablet    Refill:  3   apixaban (ELIQUIS) 5 MG TABS tablet    Sig: Take 1 tablet (5 mg total) by mouth 2 (two) times daily.    Dispense:  180 tablet    Refill:  3   carvedilol (COREG) 6.25 MG tablet    Sig: Take 1 tablet (6.25 mg total) by mouth 2 (two) times daily.    Dispense:  180 tablet    Refill:  3   Cholecalciferol (VITAMIN D3) 50 MCG (2000 UT) capsule    Sig: Take 1 capsule (2,000 Units total) by mouth daily.    Dispense:  100 capsule    Refill:  3   DISCONTD: lactulose (CHRONULAC) 10 GM/15ML solution    Sig: Take 15 mLs (10 g total) by mouth 3 (three) times daily.    Dispense:  1000 mL    Refill:  5   docusate sodium (COLACE) 100 MG capsule    Sig: Take 1 capsule (100  mg total) by mouth 2 (two) times daily.    Dispense:  90 capsule    Refill:  3   levothyroxine (SYNTHROID) 50 MCG tablet    Sig: Take 1 tablet (50 mcg total) by mouth daily before breakfast. Overdue for Annual appt must see provider for future refills    Dispense:  90 tablet    Refill:  3   lipase/protease/amylase (CREON) 12000-38000 units CPEP capsule    Sig: Take 1 capsule (12,000 Units total) by mouth daily as needed (stomach problems).    Dispense:  270 capsule    Refill:  3   magnesium oxide (MAG-OX) 400 MG tablet    Sig: Take 1 tablet (400 mg total) by mouth 2 (two) times daily.    Dispense:  180 tablet    Refill:  3   potassium chloride (KLOR-CON) 8 MEQ tablet    Sig: Take 1 tablet (8 mEq total) by mouth daily.    Dispense:  90 tablet    Refill:  3   pravastatin (PRAVACHOL) 20 MG tablet    Sig: Take 1 tablet (20 mg total) by mouth daily at 6 PM.    Dispense:  90 tablet    Refill:  3   rifaximin (XIFAXAN) 550 MG TABS tablet    Sig: Take 1 tablet  (550 mg total) by mouth 2 (two) times daily.    Dispense:  180 tablet    Refill:  1   sennosides-docusate sodium (SENOKOT-S) 8.6-50 MG tablet    Sig: Take 1 tablet by mouth daily.    Dispense:  90 tablet    Refill:  3   tamoxifen (NOLVADEX) 20 MG tablet    Sig: Take 1 tablet (20 mg total) by mouth daily. Will take after finishing Exemestane    Dispense:  90 tablet    Refill:  3   torsemide (DEMADEX) 100 MG tablet    Sig: Take 1 tablet (100 mg total) by mouth 2 (two) times daily.    Dispense:  180 tablet    Refill:  3   lactulose (CHRONULAC) 10 GM/15ML solution    Sig: Take 15 mLs (10 g total) by mouth 3 (three) times daily.    Dispense:  1000 mL    Refill:  5      Follow-up: Return in about 3 months (around 02/23/2022) for a follow-up visit.  Walker Kehr, MD

## 2021-11-27 ENCOUNTER — Other Ambulatory Visit: Payer: Self-pay | Admitting: Internal Medicine

## 2021-11-27 MED ORDER — ALLOPURINOL 100 MG PO TABS: 50.0000 mg | ORAL_TABLET | Freq: Every day | ORAL | 1 refills | Status: DC

## 2021-11-29 DIAGNOSIS — I129 Hypertensive chronic kidney disease with stage 1 through stage 4 chronic kidney disease, or unspecified chronic kidney disease: Secondary | ICD-10-CM | POA: Diagnosis not present

## 2021-11-29 DIAGNOSIS — D649 Anemia, unspecified: Secondary | ICD-10-CM | POA: Diagnosis not present

## 2021-11-29 DIAGNOSIS — R600 Localized edema: Secondary | ICD-10-CM | POA: Diagnosis not present

## 2021-11-29 DIAGNOSIS — N184 Chronic kidney disease, stage 4 (severe): Secondary | ICD-10-CM | POA: Diagnosis not present

## 2021-11-29 DIAGNOSIS — Z6841 Body Mass Index (BMI) 40.0 and over, adult: Secondary | ICD-10-CM | POA: Diagnosis not present

## 2021-11-29 DIAGNOSIS — M109 Gout, unspecified: Secondary | ICD-10-CM | POA: Diagnosis not present

## 2021-11-30 NOTE — Assessment & Plan Note (Signed)
Decompensated hepatic cirrhosis -likely NASH -despitenew diagnosis, she's had this a while; RUQ u/s on 9/14 shows nodular liver consistent with diagnosis; also some GB sludge but LFT pattern not consistent with obstruction - she has had elevated INR despite coumadin washout with ongoing elevated LFTs which may have some confounding from underlying infection; ammonia also upper limit normal on 9/9 and she's responded well to lactulose(rifaximin also started per GI) - GI consulted for further assistance with evaluation - likely NAFLD; no history of etoh use that would explain - further autoimmune workup also commenced. Only significant for elevated alpha-1 antitrypsin levels. ANA, antimitochondrial antibody, c-ANCA, p-ANCA and atypical p-ANCA titer: Negative. HBsAg and HCV antibody negative. - renal function improved with IVF (was overdiuresis with torsemide) - As per [facility] will sign off on 07/23/2021: Cirrhosis on ultrasound, likely NASH (no ascites, no prior history of variceal bleeding.  Negative hepatitis panel.  Normal ceruloplasmin, alpha-1 antitrypsin.  Hepatic encephalopathy with asterixis-on lactulose and Xifaxan and responding well. -Improved and stable.  Continue meds as noted below.  Could consider outpatient GI follow-up as deemed necessary. Records reviewed

## 2021-11-30 NOTE — Assessment & Plan Note (Signed)
On chronic and refractory.  Diet discussed

## 2021-11-30 NOTE — Assessment & Plan Note (Signed)
Abscess of right shoulder 9/23 - s/p right shoulder hemiarthroplasty in May 2022 - s/p aspiration of shoulder on 07/16/21 but appears fluid too thick for likely a good sample and pus was noted during procedure -S/p I&D with resection arthroplasty and cultures taken in the operating room on 9/19.Wound VAC removed 9/22. - ID has started her on IV vancomycin, added ceftriaxone.  Total 6 weeks of antibiotics as per OPAT protocol.   Hospital records reviewed

## 2021-11-30 NOTE — Assessment & Plan Note (Signed)
Worse.  Chronic and severe.  Will increase torsemide to 100 mg twice a day.  Follow-up with nephrology.  Low-salt diet

## 2021-12-02 ENCOUNTER — Other Ambulatory Visit: Payer: Self-pay

## 2021-12-02 ENCOUNTER — Encounter: Payer: Self-pay | Admitting: Podiatry

## 2021-12-02 ENCOUNTER — Ambulatory Visit (INDEPENDENT_AMBULATORY_CARE_PROVIDER_SITE_OTHER): Payer: Medicare HMO | Admitting: Podiatry

## 2021-12-02 DIAGNOSIS — D689 Coagulation defect, unspecified: Secondary | ICD-10-CM

## 2021-12-02 DIAGNOSIS — B351 Tinea unguium: Secondary | ICD-10-CM

## 2021-12-02 DIAGNOSIS — I890 Lymphedema, not elsewhere classified: Secondary | ICD-10-CM | POA: Diagnosis not present

## 2021-12-02 NOTE — Progress Notes (Signed)
This patient returns to my office for at risk foot care.  This patient requires this care by a professional since this patient will be at risk due to having lymphedema and coagulation defect.  Patient is taking eliquis.  This patient is unable to cut nails herself since the patient cannot reach her nails.These nails are painful walking and wearing shoes.  This patient presents for at risk foot care today.  She presents to the office in a wheelchair with her daughter.  General Appearance  Alert, conversant and in no acute stress.  Vascular  Dorsalis pedis and posterior tibial  pulses are not palpable  due to lymphedema  bilaterally.  Capillary return is within normal limits  bilaterally. Temperature is within normal limits  bilaterally.  Neurologic  Senn-Weinstein monofilament wire test within normal limits  bilaterally. Muscle power within normal limits bilaterally.  Nails Thick disfigured discolored nails with subungual debris  from hallux to fifth toes bilaterally. No evidence of bacterial infection or drainage bilaterally.  Orthopedic  No limitations of motion  feet .  No crepitus or effusions noted.  No bony pathology or digital deformities noted.  Skin  normotropic skin with no porokeratosis noted bilaterally.  No signs of infections or ulcers noted.     Onychomycosis  Pain in right toes  Pain in left toes  Consent was obtained for treatment procedures.   Mechanical debridement of nails 1-5  bilaterally performed with a nail nipper.  Filed with dremel without incident.  There was fluid on her napkin due to lymphedema.   Return office visit   4 months                   Told patient to return for periodic foot care and evaluation due to potential at risk complications.   Gardiner Barefoot DPM

## 2021-12-07 ENCOUNTER — Ambulatory Visit (INDEPENDENT_AMBULATORY_CARE_PROVIDER_SITE_OTHER): Payer: Medicare HMO | Admitting: Infectious Disease

## 2021-12-07 ENCOUNTER — Encounter: Payer: Self-pay | Admitting: Infectious Disease

## 2021-12-07 ENCOUNTER — Other Ambulatory Visit: Payer: Self-pay

## 2021-12-07 VITALS — BP 117/78 | HR 62 | Temp 98.1°F | Resp 16 | Ht 61.0 in | Wt 218.0 lb

## 2021-12-07 DIAGNOSIS — T8450XD Infection and inflammatory reaction due to unspecified internal joint prosthesis, subsequent encounter: Secondary | ICD-10-CM | POA: Diagnosis not present

## 2021-12-07 DIAGNOSIS — I482 Chronic atrial fibrillation, unspecified: Secondary | ICD-10-CM

## 2021-12-07 DIAGNOSIS — I890 Lymphedema, not elsewhere classified: Secondary | ICD-10-CM | POA: Diagnosis not present

## 2021-12-07 DIAGNOSIS — N649 Disorder of breast, unspecified: Secondary | ICD-10-CM

## 2021-12-07 DIAGNOSIS — I10 Essential (primary) hypertension: Secondary | ICD-10-CM

## 2021-12-07 HISTORY — DX: Disorder of breast, unspecified: N64.9

## 2021-12-07 NOTE — Progress Notes (Signed)
Subjective:  Chief complaint : Changes in her skin around her left nipple   Patient ID: Katelyn Jackson, female    DOB: August 30, 1946, 76 y.o.   MRN: [id number]  HPI  Katelyn Jackson is a 76 year old [name] lady with multiple medical problems including atrial fibrillation on Coumadin morbid obesity with chronic lymphedema who was admitted to the hospital in September with systemic infection.  She was ultimately found to have evidence of septic arthritis involving her right prosthetic shoulder.  She was taken to the operating room on September 21 underwent I&D of deep abscess with resection arthroplasty of the shoulder.  Cultures were taken and asked for them to be incubated for 21 days.  I reviewed the cultures from the OR neither have yielded an organism.  Note she had been on antibiotics for quite some time prior to the infection in the shoulder having been clearly identified as the cause of her systemic symptoms.  She has been on an empiric regimen of ceftriaxone and vancomycin at a skilled nursing facility.  She did complete her antibiotics onNovember 1, 2022.    She is seen Dr. Griffin Jackson in follow-up.  Her PICC line was removed.  My plan was to place her on doxycycline and cefdinir but he never appears to been placed on these.  I had printed the prescriptions and placed them in her paperwork that was sent back to the skilled nursing facility.  Fortunately she did quitewell without antibiotics and her inflammatory markers were encouraging.  She has no shoulder pain at all she says today.  Her range of motion is not completely optimal but I am quite happy her infection seems clinically to be cured.  She is Worried about some skin changes around her left nipple  This area is not Warm there is no Fluctuance or purulence there is no pruritus she also mention something on her back and she said appears to have scratched her there but I do not see Specific lesions  Past Medical History:  Diagnosis  Date   Arthritis    Cancer ([place])    breast cancer   Chronic atrial fibrillation ([place])    Chronic kidney disease    sees Dr Katelyn Jackson   Colonization with VRE (vancomycin-resistant enterococcus) 09/22/2021   Cramps, muscle, general    Dry skin 09/22/2021   Dyspnea on exertion    Dysrhythmia    a-fib,    ESBL E. coli carrier 09/22/2021   GERD (gastroesophageal reflux disease)    Headache    Herpes labialis 08/19/2021   Hyperlipidemia    Hypertension    Hypothyroidism    Lymphedema    Moderate to severe pulmonary hypertension ([place])    Obesity    Osteoarthritis of right knee 08/19/2021   Personal history of radiation therapy    Pneumonia 12/2020   Presence of permanent cardiac pacemaker 01/21/2021   for bradycardia    Syncope 12/2020   needed a pacemaker    Past Surgical History:  Procedure Laterality Date   APPENDECTOMY     BREAST BIOPSY Left 2018   BREAST LUMPECTOMY Left 04/04/2017   x3   BREAST LUMPECTOMY WITH NEEDLE LOCALIZATION AND AXILLARY SENTINEL LYMPH NODE BX Left 04/04/2017   Procedure: BREAST LUMPECTOMY WITH NEEDLE LOCALIZATION x3 AND AXILLARY SENTINEL LYMPH NODE BX;  Surgeon: Katelyn Klein, MD;  Location: [facility];  Service: General;  Laterality: Left;   CATARACT EXTRACTION W/ INTRAOCULAR LENS  IMPLANT, BILATERAL Bilateral 2018   COLONOSCOPY  EYE SURGERY     GLAUCOMA SURGERY Bilateral 2018   HARDWARE REMOVAL Right 07/26/2021   Procedure: RIGHT SHOULDER HARDWARE REMOVAL WITH WASHOUT;  Surgeon: Katelyn Gash, MD;  Location: [facility];  Service: Orthopedics;  Laterality: Right;   INSERT / REPLACE / REMOVE PACEMAKER  01/21/2021   IR FLUORO GUIDE CV LINE LEFT  07/29/2021   IR REMOVAL TUN CV CATH W/O FL  09/07/2021   IR US GUIDE BX ASP/DRAIN  07/16/2021   RE-EXCISION OF BREAST CANCER,SUPERIOR MARGINS Left 04/26/2017   Procedure: RE-EXCISION OF LEFT BREAST CANCER;  Surgeon: Katelyn Klein, MD;  Location: [facility];  Service: General;  Laterality: Left;   SHOULDER HEMI-ARTHROPLASTY  Right 03/17/2021   Procedure: SHOULDER HEMI-ARTHROPLASTY;  Surgeon: Katelyn Gash, MD;  Location: [facility];  Service: Orthopedics;  Laterality: Right;    Family History  Problem Relation Age of Onset   CAD Mother 7   Stroke Mother 11       hemorr CVA   COPD Father 34   Cancer Neg Hx       Social History   Socioeconomic History   Marital status: Widowed    Spouse name: Not on file   Number of children: 1   Years of education: Not on file   Highest education level: Not on file  Occupational History   Not on file  Tobacco Use   Smoking status: Never   Smokeless tobacco: Never  Vaping Use   Vaping Use: Never used  Substance and Sexual Activity   Alcohol use: No    Alcohol/week: 0.0 standard drinks   Drug use: No   Sexual activity: Not on file  Other Topics Concern   Not on file  Social History Narrative   Not on file   Social Determinants of Health   Financial Resource Strain: Low Risk    Difficulty of Paying Living Expenses: Not hard at all  Food Insecurity: No Food Insecurity   Worried About Charity fundraiser in the Last Year: Never true   Arboriculturist in the Last Year: Never true  Transportation Needs: Public librarian (Medical): Yes   Lack of Transportation (Non-Medical): Yes  Physical Activity: Inactive   Days of Exercise per Week: 0 days   Minutes of Exercise per Session: 0 min  Stress: No Stress Concern Present   Feeling of Stress : Not at all  Social Connections: Socially Isolated   Frequency of Communication with Friends and Family: More than three times a week   Frequency of Social Gatherings with Friends and Family: Once a week   Attends Religious Services: Never   Marine scientist or Organizations: No   Attends Archivist Meetings: Never   Marital Status: Widowed    Allergies  Allergen Reactions   Chlorhexidine Gluconate Hives   Penicillins Itching    Has patient had a PCN reaction causing  immediate rash, facial/tongue/throat swelling, SOB or lightheadedness with hypotension: no Has patient had a PCN reaction causing severe rash involving mucus membranes or skin necrosis: No Has patient had a PCN reaction that required hospitalization: No Has patient had a PCN reaction occurring within the last 10 years: No If all of the above answers are "NO", then may proceed with Cephalosporin use. Tolerated Cephalosporin Date: 03/17/21. Td Ancef 2g 07/26/21     Current Outpatient Medications:    acetaminophen (TYLENOL) 325 MG tablet, Take 2 tablets (650 mg total) by mouth  every 6 (six) hours as needed for mild pain or moderate pain (or Fever >/= 101)., Disp: , Rfl:    allopurinol (ZYLOPRIM) 100 MG tablet, Take 0.5 tablets (50 mg total) by mouth daily., Disp: 90 tablet, Rfl: 1   apixaban (ELIQUIS) 5 MG TABS tablet, Take 1 tablet (5 mg total) by mouth 2 (two) times daily., Disp: 180 tablet, Rfl: 3   bisacodyl (DULCOLAX) 10 MG suppository, Place 10 mg rectally daily as needed for moderate constipation., Disp: , Rfl:    carvedilol (COREG) 6.25 MG tablet, Take 1 tablet (6.25 mg total) by mouth 2 (two) times daily., Disp: 180 tablet, Rfl: 3   Cholecalciferol (VITAMIN D3) 50 MCG (2000 UT) capsule, Take 1 capsule (2,000 Units total) by mouth daily., Disp: 100 capsule, Rfl: 3   Cyanocobalamin (VITAMIN B-12 PO), Take 2,000 mcg by mouth daily., Disp: , Rfl:    diclofenac Sodium (VOLTAREN) 1 % GEL, APPLY FOUR GRAMS FOUR TIMES DAILY AS NEEDED (Patient taking differently: Apply 4 g topically 4 (four) times daily as needed (pain).), Disp: 100 g, Rfl: 5   docusate sodium (COLACE) 100 MG capsule, Take 1 capsule (100 mg total) by mouth 2 (two) times daily., Disp: 90 capsule, Rfl: 3   lactulose (CHRONULAC) 10 GM/15ML solution, Take 15 mLs (10 g total) by mouth 3 (three) times daily., Disp: 1000 mL, Rfl: 5   levothyroxine (SYNTHROID) 50 MCG tablet, Take 1 tablet (50 mcg total) by mouth daily before breakfast.  Overdue for Annual appt must see provider for future refills, Disp: 90 tablet, Rfl: 3   lipase/protease/amylase (CREON) 12000-38000 units CPEP capsule, Take 1 capsule (12,000 Units total) by mouth daily as needed (stomach problems)., Disp: 270 capsule, Rfl: 3   magnesium oxide (MAG-OX) 400 MG tablet, Take 1 tablet (400 mg total) by mouth 2 (two) times daily., Disp: 180 tablet, Rfl: 3   methocarbamol (ROBAXIN) 500 MG tablet, Take 1 tablet (500 mg total) by mouth every 8 (eight) hours as needed for muscle spasms., Disp: 20 tablet, Rfl: 0   OVER THE COUNTER MEDICATION, Apply 1 application topically daily as needed (apply to buttocks  as needed for irritaiton). Intensive Skin Care Therapy, Disp: , Rfl:    oxyCODONE (OXY IR/ROXICODONE) 5 MG immediate release tablet, Take 1 pills every 6 hrs as needed for severe pain, Disp: 24 tablet, Rfl: 0   polyethylene glycol (MIRALAX / GLYCOLAX) 17 g packet, Take 17 g by mouth 2 (two) times daily., Disp: , Rfl:    polyvinyl alcohol (LIQUIFILM TEARS) 1.4 % ophthalmic solution, Place 1 drop into both eyes as needed for dry eyes., Disp: , Rfl:    potassium chloride (KLOR-CON) 8 MEQ tablet, Take 1 tablet (8 mEq total) by mouth daily., Disp: 90 tablet, Rfl: 3   pravastatin (PRAVACHOL) 20 MG tablet, Take 1 tablet (20 mg total) by mouth daily at 6 PM., Disp: 90 tablet, Rfl: 3   rifaximin (XIFAXAN) 550 MG TABS tablet, Take 1 tablet (550 mg total) by mouth 2 (two) times daily., Disp: 180 tablet, Rfl: 1   sennosides-docusate sodium (SENOKOT-S) 8.6-50 MG tablet, Take 1 tablet by mouth daily., Disp: 90 tablet, Rfl: 3   simethicone (MYLICON) 40 EX/5.1ZG drops, Take 0.3 mLs (20 mg total) by mouth 4 (four) times daily as needed for flatulence., Disp: , Rfl:    tamoxifen (NOLVADEX) 20 MG tablet, Take 1 tablet (20 mg total) by mouth daily. Will take after finishing Exemestane, Disp: 90 tablet, Rfl: 3   torsemide (DEMADEX) 100  MG tablet, Take 1 tablet (100 mg total) by mouth 2 (two)  times daily., Disp: 180 tablet, Rfl: 3   Past Medical History:  Diagnosis Date   Arthritis    Cancer ([place])    breast cancer   Chronic atrial fibrillation ([facility])    Chronic kidney disease    sees Dr Katelyn Jackson   Colonization with VRE (vancomycin-resistant enterococcus) 09/22/2021   Cramps, muscle, general    Dry skin 09/22/2021   Dyspnea on exertion    Dysrhythmia    a-fib,    ESBL E. coli carrier 09/22/2021   GERD (gastroesophageal reflux disease)    Headache    Herpes labialis 08/19/2021   Hyperlipidemia    Hypertension    Hypothyroidism    Lymphedema    Moderate to severe pulmonary hypertension ([place])    Obesity    Osteoarthritis of right knee 08/19/2021   Personal history of radiation therapy    Pneumonia 12/2020   Presence of permanent cardiac pacemaker 01/21/2021   for bradycardia    Syncope 12/2020   needed a pacemaker    Past Surgical History:  Procedure Laterality Date   APPENDECTOMY     BREAST BIOPSY Left 2018   BREAST LUMPECTOMY Left 04/04/2017   x3   BREAST LUMPECTOMY WITH NEEDLE LOCALIZATION AND AXILLARY SENTINEL LYMPH NODE BX Left 04/04/2017   Procedure: BREAST LUMPECTOMY WITH NEEDLE LOCALIZATION x3 AND AXILLARY SENTINEL LYMPH NODE BX;  Surgeon: Katelyn Klein, MD;  Location: [facility];  Service: General;  Laterality: Left;   CATARACT EXTRACTION W/ INTRAOCULAR LENS  IMPLANT, BILATERAL Bilateral 2018   COLONOSCOPY     EYE SURGERY     GLAUCOMA SURGERY Bilateral 2018   HARDWARE REMOVAL Right 07/26/2021   Procedure: RIGHT SHOULDER HARDWARE REMOVAL WITH WASHOUT;  Surgeon: Katelyn Gash, MD;  [facility];  Service: Orthopedics;  Laterality: Right;   INSERT / REPLACE / REMOVE PACEMAKER  01/21/2021   IR FLUORO GUIDE CV LINE LEFT  07/29/2021   IR REMOVAL TUN CV CATH W/O FL  09/07/2021   IR US GUIDE BX ASP/DRAIN  07/16/2021   RE-EXCISION OF BREAST CANCER,SUPERIOR MARGINS Left 04/26/2017   Procedure: RE-EXCISION OF LEFT BREAST CANCER;  Surgeon: Katelyn Klein, MD;   Location: [facility];  Service: General;  Laterality: Left;   SHOULDER HEMI-ARTHROPLASTY Right 03/17/2021   Procedure: SHOULDER HEMI-ARTHROPLASTY;  Surgeon: Katelyn Gash, MD;  [facility];  Service: Orthopedics;  Laterality: Right;    Family History  Problem Relation Age of Onset   CAD Mother 44   Stroke Mother 44       hemorr CVA   COPD Father 20   Cancer Neg Hx       Social History   Socioeconomic History   Marital status: Widowed    Spouse name: Not on file   Number of children: 1   Years of education: Not on file   Highest education level: Not on file  Occupational History   Not on file  Tobacco Use   Smoking status: Never   Smokeless tobacco: Never  Vaping Use   Vaping Use: Never used  Substance and Sexual Activity   Alcohol use: No    Alcohol/week: 0.0 standard drinks   Drug use: No   Sexual activity: Not on file  Other Topics Concern   Not on file  Social History Narrative   Not on file   Social Determinants of Health   Financial Resource Strain: Low Risk    Difficulty  of Paying Living Expenses: Not hard at all  Food Insecurity: No Food Insecurity   Worried About Charity fundraiser in the Last Year: Never true   Ran Out of Food in the Last Year: Never true  Transportation Needs: Unmet Transportation Needs   Lack of Transportation (Medical): Yes   Lack of Transportation (Non-Medical): Yes  Physical Activity: Inactive   Days of Exercise per Week: 0 days   Minutes of Exercise per Session: 0 min  Stress: No Stress Concern Present   Feeling of Stress : Not at all  Social Connections: Socially Isolated   Frequency of Communication with Friends and Family: More than three times a week   Frequency of Social Gatherings with Friends and Family: Once a week   Attends Religious Services: Never   Marine scientist or Organizations: No   Attends Archivist Meetings: Never   Marital Status: Widowed    Allergies  Allergen Reactions    Chlorhexidine Gluconate Hives   Penicillins Itching    Has patient had a PCN reaction causing immediate rash, facial/tongue/throat swelling, SOB or lightheadedness with hypotension: no Has patient had a PCN reaction causing severe rash involving mucus membranes or skin necrosis: No Has patient had a PCN reaction that required hospitalization: No Has patient had a PCN reaction occurring within the last 10 years: No If all of the above answers are "NO", then may proceed with Cephalosporin use. Tolerated Cephalosporin Date: 03/17/21. Td Ancef 2g 07/26/21     Current Outpatient Medications:    acetaminophen (TYLENOL) 325 MG tablet, Take 2 tablets (650 mg total) by mouth every 6 (six) hours as needed for mild pain or moderate pain (or Fever >/= 101)., Disp: , Rfl:    allopurinol (ZYLOPRIM) 100 MG tablet, Take 0.5 tablets (50 mg total) by mouth daily., Disp: 90 tablet, Rfl: 1   apixaban (ELIQUIS) 5 MG TABS tablet, Take 1 tablet (5 mg total) by mouth 2 (two) times daily., Disp: 180 tablet, Rfl: 3   bisacodyl (DULCOLAX) 10 MG suppository, Place 10 mg rectally daily as needed for moderate constipation., Disp: , Rfl:    carvedilol (COREG) 6.25 MG tablet, Take 1 tablet (6.25 mg total) by mouth 2 (two) times daily., Disp: 180 tablet, Rfl: 3   Cholecalciferol (VITAMIN D3) 50 MCG (2000 UT) capsule, Take 1 capsule (2,000 Units total) by mouth daily., Disp: 100 capsule, Rfl: 3   Cyanocobalamin (VITAMIN B-12 PO), Take 2,000 mcg by mouth daily., Disp: , Rfl:    diclofenac Sodium (VOLTAREN) 1 % GEL, APPLY FOUR GRAMS FOUR TIMES DAILY AS NEEDED (Patient taking differently: Apply 4 g topically 4 (four) times daily as needed (pain).), Disp: 100 g, Rfl: 5   docusate sodium (COLACE) 100 MG capsule, Take 1 capsule (100 mg total) by mouth 2 (two) times daily., Disp: 90 capsule, Rfl: 3   lactulose (CHRONULAC) 10 GM/15ML solution, Take 15 mLs (10 g total) by mouth 3 (three) times daily., Disp: 1000 mL, Rfl: 5    levothyroxine (SYNTHROID) 50 MCG tablet, Take 1 tablet (50 mcg total) by mouth daily before breakfast. Overdue for Annual appt must see provider for future refills, Disp: 90 tablet, Rfl: 3   lipase/protease/amylase (CREON) 12000-38000 units CPEP capsule, Take 1 capsule (12,000 Units total) by mouth daily as needed (stomach problems)., Disp: 270 capsule, Rfl: 3   magnesium oxide (MAG-OX) 400 MG tablet, Take 1 tablet (400 mg total) by mouth 2 (two) times daily., Disp: 180 tablet, Rfl: 3  methocarbamol (ROBAXIN) 500 MG tablet, Take 1 tablet (500 mg total) by mouth every 8 (eight) hours as needed for muscle spasms., Disp: 20 tablet, Rfl: 0   OVER THE COUNTER MEDICATION, Apply 1 application topically daily as needed (apply to buttocks  as needed for irritaiton). Intensive Skin Care Therapy, Disp: , Rfl:    oxyCODONE (OXY IR/ROXICODONE) 5 MG immediate release tablet, Take 1 pills every 6 hrs as needed for severe pain, Disp: 24 tablet, Rfl: 0   polyethylene glycol (MIRALAX / GLYCOLAX) 17 g packet, Take 17 g by mouth 2 (two) times daily., Disp: , Rfl:    polyvinyl alcohol (LIQUIFILM TEARS) 1.4 % ophthalmic solution, Place 1 drop into both eyes as needed for dry eyes., Disp: , Rfl:    potassium chloride (KLOR-CON) 8 MEQ tablet, Take 1 tablet (8 mEq total) by mouth daily., Disp: 90 tablet, Rfl: 3   pravastatin (PRAVACHOL) 20 MG tablet, Take 1 tablet (20 mg total) by mouth daily at 6 PM., Disp: 90 tablet, Rfl: 3   rifaximin (XIFAXAN) 550 MG TABS tablet, Take 1 tablet (550 mg total) by mouth 2 (two) times daily., Disp: 180 tablet, Rfl: 1   sennosides-docusate sodium (SENOKOT-S) 8.6-50 MG tablet, Take 1 tablet by mouth daily., Disp: 90 tablet, Rfl: 3   simethicone (MYLICON) 40 IW/9.7LG drops, Take 0.3 mLs (20 mg total) by mouth 4 (four) times daily as needed for flatulence., Disp: , Rfl:    tamoxifen (NOLVADEX) 20 MG tablet, Take 1 tablet (20 mg total) by mouth daily. Will take after finishing Exemestane, Disp: 90  tablet, Rfl: 3   torsemide (DEMADEX) 100 MG tablet, Take 1 tablet (100 mg total) by mouth 2 (two) times daily., Disp: 180 tablet, Rfl: 3   Review of Systems  Constitutional:  Negative for activity change, appetite change, chills, diaphoresis, fatigue, fever and unexpected weight change.  HENT:  Negative for congestion, rhinorrhea, sinus pressure, sneezing, sore throat and trouble swallowing.   Eyes:  Negative for photophobia and visual disturbance.  Respiratory:  Negative for cough, chest tightness, shortness of breath, wheezing and stridor.   Cardiovascular:  Negative for chest pain, palpitations and leg swelling.  Gastrointestinal:  Negative for abdominal distention, abdominal pain, anal bleeding, blood in stool, constipation, diarrhea, nausea and vomiting.  Genitourinary:  Negative for difficulty urinating, dysuria, flank pain and hematuria.  Musculoskeletal:  Negative for arthralgias, back pain, gait problem, joint swelling and myalgias.  Skin:  Positive for rash. Negative for color change, pallor and wound.  Neurological:  Negative for dizziness, tremors, weakness and light-headedness.  Hematological:  Negative for adenopathy. Does not bruise/bleed easily.  Psychiatric/Behavioral:  Negative for agitation, behavioral problems, confusion, decreased concentration, dysphoric mood and sleep disturbance.       Objective:   Physical Exam Exam conducted with a chaperone present.  Constitutional:      General: She is not in acute distress.    Appearance: Normal appearance. She is well-developed. She is not ill-appearing or diaphoretic.  HENT:     Head: Normocephalic and atraumatic.     Right Ear: Hearing and external ear normal.     Left Ear: Hearing and external ear normal.     Nose: No nasal deformity or rhinorrhea.  Eyes:     General: No scleral icterus.    Conjunctiva/sclera: Conjunctivae normal.     Right eye: Right conjunctiva is not injected.     Left eye: Left conjunctiva is not  injected.     Pupils: Pupils are equal,  round, and reactive to light.  Neck:     Vascular: No JVD.  Cardiovascular:     Rate and Rhythm: Normal rate. Rhythm irregular.     Heart sounds: S1 normal and S2 normal.  Pulmonary:     Effort: Pulmonary effort is normal. No respiratory distress.     Breath sounds: No wheezing.  Abdominal:     Palpations: Abdomen is soft.  Musculoskeletal:     Right shoulder: Decreased range of motion.     Left shoulder: Normal.     Cervical back: Normal range of motion and neck supple.     Right hip: Normal.     Left hip: Normal.     Right knee: Normal.     Left knee: Normal.     Right lower leg: Edema present.     Left lower leg: Edema present.     Comments: Right shoulder surgical scars well-healed  Lymphadenopathy:     Head:     Right side of head: No submandibular, preauricular or posterior auricular adenopathy.     Left side of head: No submandibular, preauricular or posterior auricular adenopathy.     Cervical: No cervical adenopathy.     Right cervical: No superficial or deep cervical adenopathy.    Left cervical: No superficial or deep cervical adenopathy.  Skin:    General: Skin is warm and dry.     Coloration: Skin is not pale.     Findings: No abrasion, bruising, ecchymosis, erythema, lesion or rash.     Nails: There is no clubbing.  Neurological:     General: No focal deficit present.     Mental Status: She is alert and oriented to person, place, and time.     Sensory: No sensory deficit.     Coordination: Coordination normal.     Gait: Gait normal.  Psychiatric:        Attention and Perception: She is attentive.        Mood and Affect: Mood normal.        Speech: Speech normal.        Behavior: Behavior normal. Behavior is cooperative.        Thought Content: Thought content normal.        Judgment: Judgment normal.         Assessment & Plan:  Prosthetic shoulder infection status post resection arthroplasty followed by 6  weeks of parenteral antibiotics.  She has done well off antibiotics clinically.  We will recheck sed rate CRP CBC and CMP today.  Provided labs are normal she can return to clinic as needed  Lesions around left breast she is scheduled to see obstetrician  gynecology next week.  I do not see any evidence for infection VRE constant colonization this really does not need to be treated.  Atrial fibrillation on anticoagulation   Edema stable chronic  Seen counseling we do not have updated vaccine records from her skilled nursing facility where she lived before but I offered her Prevnar 20 but she did not want to have that today.

## 2021-12-08 LAB — BASIC METABOLIC PANEL WITH GFR
BUN/Creatinine Ratio: 28 (calc) — ABNORMAL HIGH (ref 6–22)
BUN: 59 mg/dL — ABNORMAL HIGH (ref 7–25)
CO2: 35 mmol/L — ABNORMAL HIGH (ref 20–32)
Calcium: 10.1 mg/dL (ref 8.6–10.4)
Chloride: 94 mmol/L — ABNORMAL LOW (ref 98–110)
Creat: 2.12 mg/dL — ABNORMAL HIGH (ref 0.60–1.00)
Glucose, Bld: 97 mg/dL (ref 65–99)
Potassium: 4.5 mmol/L (ref 3.5–5.3)
Sodium: 136 mmol/L (ref 135–146)
eGFR: 24 mL/min/{1.73_m2} — ABNORMAL LOW (ref >=60)

## 2021-12-08 LAB — CBC WITH DIFFERENTIAL/PLATELET
Absolute Monocytes: 528 cells/uL (ref 200–950)
Basophils Absolute: 49 cells/uL (ref 0–200)
Basophils Relative: 1.3 %
Eosinophils Absolute: 198 cells/uL (ref 15–500)
Eosinophils Relative: 5.2 %
HCT: 29.1 % — ABNORMAL LOW (ref 35.0–45.0)
Hemoglobin: 9.3 g/dL — ABNORMAL LOW (ref 11.7–15.5)
Lymphs Abs: 1113 cells/uL (ref 850–3900)
MCH: 31.3 pg (ref 27.0–33.0)
MCHC: 32 g/dL (ref 32.0–36.0)
MCV: 98 fL (ref 80.0–100.0)
MPV: 10.8 fL (ref 7.5–12.5)
Monocytes Relative: 13.9 %
Neutro Abs: 1911 cells/uL (ref 1500–7800)
Neutrophils Relative %: 50.3 %
Platelets: 141 10*3/uL (ref 140–400)
RBC: 2.97 10*6/uL — ABNORMAL LOW (ref 3.80–5.10)
RDW: 13.2 % (ref 11.0–15.0)
Total Lymphocyte: 29.3 %
WBC: 3.8 10*3/uL (ref 3.8–10.8)

## 2021-12-08 LAB — C-REACTIVE PROTEIN: CRP: 0.5 mg/L (ref <8.0)

## 2021-12-08 LAB — SEDIMENTATION RATE: Sed Rate: 14 mm/h (ref 0–30)

## 2021-12-09 ENCOUNTER — Telehealth: Payer: Self-pay

## 2021-12-09 NOTE — Telephone Encounter (Signed)
-----   Message from Truman Hayward, MD sent at 12/08/2021  8:24 AM EST ----- Patient's inflammatory markers are encouraging and there is no evidence of infection but her kidney function has worsened versus last time I checked that she should follow-up with PCP regarding this ----- Message ----- From: Cheyenne Adas Lab Results In Sent: 12/07/2021  10:45 PM EST To: Truman Hayward, MD

## 2021-12-09 NOTE — Telephone Encounter (Signed)
Called patient's son to discuss labs, no answer and unable to leave voicemail.   Forwarded labs to patient's PCP, Dr. [name].   Beryle Flock, RN

## 2021-12-09 NOTE — Telephone Encounter (Signed)
Tried patient's son's phone number again, no answer and unable to leave message.   Beryle Flock, RN

## 2021-12-10 ENCOUNTER — Encounter (HOSPITAL_COMMUNITY): Payer: Medicare HMO

## 2021-12-10 DIAGNOSIS — J189 Pneumonia, unspecified organism: Secondary | ICD-10-CM | POA: Diagnosis not present

## 2021-12-10 DIAGNOSIS — N184 Chronic kidney disease, stage 4 (severe): Secondary | ICD-10-CM | POA: Diagnosis not present

## 2021-12-10 DIAGNOSIS — I129 Hypertensive chronic kidney disease with stage 1 through stage 4 chronic kidney disease, or unspecified chronic kidney disease: Secondary | ICD-10-CM | POA: Diagnosis not present

## 2021-12-10 DIAGNOSIS — L0390 Cellulitis, unspecified: Secondary | ICD-10-CM | POA: Diagnosis not present

## 2021-12-10 DIAGNOSIS — R001 Bradycardia, unspecified: Secondary | ICD-10-CM | POA: Diagnosis not present

## 2021-12-10 DIAGNOSIS — W19XXXS Unspecified fall, sequela: Secondary | ICD-10-CM | POA: Diagnosis not present

## 2021-12-10 DIAGNOSIS — I4821 Permanent atrial fibrillation: Secondary | ICD-10-CM | POA: Diagnosis not present

## 2021-12-10 DIAGNOSIS — Z950 Presence of cardiac pacemaker: Secondary | ICD-10-CM | POA: Insufficient documentation

## 2021-12-13 ENCOUNTER — Inpatient Hospital Stay: Payer: Medicare HMO

## 2021-12-13 ENCOUNTER — Inpatient Hospital Stay: Payer: Medicare HMO | Admitting: Hematology

## 2021-12-13 ENCOUNTER — Telehealth: Payer: Self-pay

## 2021-12-13 ENCOUNTER — Telehealth: Payer: Self-pay | Admitting: Hematology

## 2021-12-13 DIAGNOSIS — C50412 Malignant neoplasm of upper-outer quadrant of left female breast: Secondary | ICD-10-CM

## 2021-12-13 NOTE — Telephone Encounter (Signed)
Patient son aware of patient lab results and to follow up with PCP. He voiced his understanding.    [place], CMA

## 2021-12-13 NOTE — Telephone Encounter (Signed)
Sch per 2/6 inbasket, pt son aware

## 2021-12-13 NOTE — Telephone Encounter (Signed)
Pt is no longer at the [facility].  Pt now lives with her son [name].  Spoke with [name] via telephone to cancel appt for today per Dr. Ernestina Penna request.  Pt was supposed to been scheduled 6 months from last office visit pt Dr. Ernestina Penna LOS.

## 2021-12-22 ENCOUNTER — Encounter: Payer: Self-pay | Admitting: Internal Medicine

## 2021-12-22 ENCOUNTER — Ambulatory Visit (INDEPENDENT_AMBULATORY_CARE_PROVIDER_SITE_OTHER): Payer: Medicare HMO | Admitting: Internal Medicine

## 2021-12-22 ENCOUNTER — Other Ambulatory Visit: Payer: Self-pay

## 2021-12-22 DIAGNOSIS — N184 Chronic kidney disease, stage 4 (severe): Secondary | ICD-10-CM

## 2021-12-22 DIAGNOSIS — I890 Lymphedema, not elsewhere classified: Secondary | ICD-10-CM

## 2021-12-22 DIAGNOSIS — M5442 Lumbago with sciatica, left side: Secondary | ICD-10-CM

## 2021-12-22 DIAGNOSIS — G8929 Other chronic pain: Secondary | ICD-10-CM

## 2021-12-22 DIAGNOSIS — I10 Essential (primary) hypertension: Secondary | ICD-10-CM

## 2021-12-22 MED ORDER — TRAMADOL HCL 50 MG PO TABS: 50.0000 mg | ORAL_TABLET | Freq: Two times a day (BID) | ORAL | 5 refills | Status: AC | PRN

## 2021-12-22 NOTE — Addendum Note (Signed)
Addended by: Cassandria Anger on: 12/22/2021 08:52 AM   Modules accepted: Orders

## 2021-12-22 NOTE — Patient Instructions (Addendum)
Women's slippers for swollen feet  Continue w/leg pump  Elevate legs

## 2021-12-22 NOTE — Assessment & Plan Note (Signed)
On Coreg, Torsemide BP Readings from Last 3 Encounters:  12/22/21 110/70  12/07/21 117/78  11/25/21 (!) 148/70

## 2021-12-22 NOTE — Progress Notes (Signed)
Subjective:  Patient ID: Katelyn Jackson, female    DOB: [birth year]  Age: 76 y.o. MRN: [id number]  CC: No chief complaint on file.   HPI Tata Timmins presents for abn GFR - worse due to increased diuretics Edema is not better  Outpatient Medications Prior to Visit  Medication Sig Dispense Refill   acetaminophen (TYLENOL) 325 MG tablet Take 2 tablets (650 mg total) by mouth every 6 (six) hours as needed for mild pain or moderate pain (or Fever >/= 101).     allopurinol (ZYLOPRIM) 100 MG tablet Take 0.5 tablets (50 mg total) by mouth daily. 90 tablet 1   apixaban (ELIQUIS) 5 MG TABS tablet Take 1 tablet (5 mg total) by mouth 2 (two) times daily. 180 tablet 3   bisacodyl (DULCOLAX) 10 MG suppository Place 10 mg rectally daily as needed for moderate constipation.     carvedilol (COREG) 6.25 MG tablet Take 1 tablet (6.25 mg total) by mouth 2 (two) times daily. 180 tablet 3   Cholecalciferol (VITAMIN D3) 50 MCG (2000 UT) capsule Take 1 capsule (2,000 Units total) by mouth daily. 100 capsule 3   Cyanocobalamin (VITAMIN B-12 PO) Take 2,000 mcg by mouth daily.     diclofenac Sodium (VOLTAREN) 1 % GEL APPLY FOUR GRAMS FOUR TIMES DAILY AS NEEDED (Patient taking differently: Apply 4 g topically 4 (four) times daily as needed (pain).) 100 g 5   docusate sodium (COLACE) 100 MG capsule Take 1 capsule (100 mg total) by mouth 2 (two) times daily. 90 capsule 3   lactulose (CHRONULAC) 10 GM/15ML solution Take 15 mLs (10 g total) by mouth 3 (three) times daily. 1000 mL 5   levothyroxine (SYNTHROID) 50 MCG tablet Take 1 tablet (50 mcg total) by mouth daily before breakfast. Overdue for Annual appt must see provider for future refills 90 tablet 3   lipase/protease/amylase (CREON) 12000-38000 units CPEP capsule Take 1 capsule (12,000 Units total) by mouth daily as needed (stomach problems). 270 capsule 3   magnesium oxide (MAG-OX) 400 MG tablet Take 1 tablet (400 mg total) by mouth 2 (two) times daily. 180  tablet 3   methocarbamol (ROBAXIN) 500 MG tablet Take 1 tablet (500 mg total) by mouth every 8 (eight) hours as needed for muscle spasms. 20 tablet 0   OVER THE COUNTER MEDICATION Apply 1 application topically daily as needed (apply to buttocks  as needed for irritaiton). Intensive Skin Care Therapy     oxyCODONE (OXY IR/ROXICODONE) 5 MG immediate release tablet Take 1 pills every 6 hrs as needed for severe pain 24 tablet 0   polyethylene glycol (MIRALAX / GLYCOLAX) 17 g packet Take 17 g by mouth 2 (two) times daily.     polyvinyl alcohol (LIQUIFILM TEARS) 1.4 % ophthalmic solution Place 1 drop into both eyes as needed for dry eyes.     potassium chloride (KLOR-CON) 8 MEQ tablet Take 1 tablet (8 mEq total) by mouth daily. 90 tablet 3   pravastatin (PRAVACHOL) 20 MG tablet Take 1 tablet (20 mg total) by mouth daily at 6 PM. 90 tablet 3   rifaximin (XIFAXAN) 550 MG TABS tablet Take 1 tablet (550 mg total) by mouth 2 (two) times daily. 180 tablet 1   sennosides-docusate sodium (SENOKOT-S) 8.6-50 MG tablet Take 1 tablet by mouth daily. 90 tablet 3   simethicone (MYLICON) 40 WN/4.6EV drops Take 0.3 mLs (20 mg total) by mouth 4 (four) times daily as needed for flatulence.     tamoxifen (NOLVADEX) 20 MG  tablet Take 1 tablet (20 mg total) by mouth daily. Will take after finishing Exemestane 90 tablet 3   torsemide (DEMADEX) 100 MG tablet Take 1 tablet (100 mg total) by mouth 2 (two) times daily. 180 tablet 3   No facility-administered medications prior to visit.    ROS: Review of Systems  Constitutional:  Positive for fatigue. Negative for activity change, appetite change, chills and unexpected weight change.  HENT:  Negative for congestion, mouth sores and sinus pressure.   Eyes:  Negative for visual disturbance.  Respiratory:  Negative for cough and chest tightness.   Cardiovascular:  Positive for leg swelling.  Gastrointestinal:  Negative for abdominal pain and nausea.  Genitourinary:  Negative  for difficulty urinating, frequency and vaginal pain.  Musculoskeletal:  Negative for back pain and gait problem.  Skin:  Negative for color change, pallor, rash and wound.  Neurological:  Negative for dizziness, tremors, weakness, numbness and headaches.  Psychiatric/Behavioral:  Negative for confusion, sleep disturbance and suicidal ideas.    Objective:  BP 110/70 (BP Location: Left Arm, Patient Position: Sitting, Cuff Size: Large)    Pulse 68    Temp 98 F (36.7 C) (Oral)    Ht 5\' 1"  (1.549 m)    Wt 205 lb (93 kg)    LMP  (LMP Unknown)    SpO2 96%    BMI 38.73 kg/m   BP Readings from Last 3 Encounters:  12/22/21 110/70  12/07/21 117/78  11/25/21 (!) 148/70    Wt Readings from Last 3 Encounters:  12/22/21 205 lb (93 kg)  12/07/21 218 lb (98.9 kg)  11/25/21 218 lb 3 oz (99 kg)    Physical Exam Constitutional:      General: She is not in acute distress.    Appearance: She is well-developed. She is obese.  HENT:     Head: Normocephalic.     Right Ear: External ear normal.     Left Ear: External ear normal.     Nose: Nose normal.  Eyes:     General:        Right eye: No discharge.        Left eye: No discharge.     Conjunctiva/sclera: Conjunctivae normal.     Pupils: Pupils are equal, round, and reactive to light.  Neck:     Thyroid: No thyromegaly.     Vascular: No JVD.     Trachea: No tracheal deviation.  Cardiovascular:     Rate and Rhythm: Normal rate and regular rhythm.     Heart sounds: Normal heart sounds.  Pulmonary:     Effort: No respiratory distress.     Breath sounds: No stridor. No wheezing.  Abdominal:     General: Bowel sounds are normal. There is no distension.     Palpations: Abdomen is soft. There is no mass.     Tenderness: There is no abdominal tenderness. There is no guarding or rebound.  Musculoskeletal:        General: No tenderness.     Cervical back: Normal range of motion and neck supple. No rigidity.     Right lower leg: Edema present.      Left lower leg: Edema present.  Lymphadenopathy:     Cervical: No cervical adenopathy.  Skin:    Coloration: Skin is not pale.     Findings: No erythema or rash.  Neurological:     Cranial Nerves: No cranial nerve deficit.     Motor: No abnormal muscle tone.  Coordination: Coordination normal.     Deep Tendon Reflexes: Reflexes normal.  Psychiatric:        Behavior: Behavior normal.        Thought Content: Thought content normal.        Judgment: Judgment normal.   L>>R LE edema 3-4+   Lab Results  Component Value Date   WBC 3.8 12/07/2021   HGB 9.3 (L) 12/07/2021   HCT 29.1 (L) 12/07/2021   PLT 141 12/07/2021   GLUCOSE 97 12/07/2021   CHOL 163 11/25/2021   TRIG 66.0 11/25/2021   HDL 77.50 11/25/2021   LDLCALC 72 11/25/2021   ALT 13 11/25/2021   AST 20 11/25/2021   NA 136 12/07/2021   K 4.5 12/07/2021   CL 94 (L) 12/07/2021   CREATININE 2.12 (H) 12/07/2021   BUN 59 (H) 12/07/2021   CO2 35 (H) 12/07/2021   TSH 3.90 11/25/2021   INR 2.9 (H) 07/30/2021   HGBA1C 5.0 11/25/2021    DG Chest Port 1 View  Result Date: 10/12/2021 CLINICAL DATA:  Fever, cough. EXAM: PORTABLE CHEST 1 VIEW COMPARISON:  07/01/2021. FINDINGS: The heart is enlarged. There is atherosclerotic calcification of the aorta. The pulmonary vasculature is within normal limits. No consolidation, effusion, or pneumothorax is seen. A dual lead pacemaker is present over the left chest. Surgical clips are noted in the right axilla. Destructive changes are present at the right glenohumeral joint and there has been interval removal of right shoulder arthroplasty hardware. IMPRESSION: Cardiomegaly with no acute process. Electronically Signed   By: Brett Fairy M.D.   On: 10/12/2021 04:43    Assessment & Plan:   Problem List Items Addressed This Visit     Essential hypertension (Chronic)    On Coreg, Torsemide BP Readings from Last 3 Encounters:  12/22/21 110/70  12/07/21 117/78  11/25/21 (!) 148/70         CKD (chronic kidney disease) stage 4, GFR 15-29 ml/min (HCC)    GFR - worse due to increased diuretics. F/u w/nephrilogy - Dr Johnney Ou      Low back pain    Tramadol prn  Potential benefits of a long term opioids use as well as potential risks (i.e. addiction risk, apnea etc) and complications (i.e. Somnolence, constipation and others) were explained to the patient and were aknowledged.       Lymphedema    Cont w/a leg pump Elevate legs       Morbid obesity (HCC)    Wt Readings from Last 3 Encounters:  12/22/21 205 lb (93 kg)  12/07/21 218 lb (98.9 kg)  11/25/21 218 lb 3 oz (99 kg)           No orders of the defined types were placed in this encounter.     Follow-up: Return in about 2 months (around 02/19/2022) for a follow-up visit.  Walker Kehr, MD

## 2021-12-22 NOTE — Assessment & Plan Note (Signed)
Wt Readings from Last 3 Encounters:  12/22/21 205 lb (93 kg)  12/07/21 218 lb (98.9 kg)  11/25/21 218 lb 3 oz (99 kg)

## 2021-12-22 NOTE — Assessment & Plan Note (Signed)
GFR - worse due to increased diuretics. F/u w/nephrilogy - Dr Johnney Ou

## 2021-12-22 NOTE — Assessment & Plan Note (Signed)
Tramadol prn ° Potential benefits of a long term opioids use as well as potential risks (i.e. addiction risk, apnea etc) and complications (i.e. Somnolence, constipation and others) were explained to the patient and were aknowledged. ° ° °

## 2021-12-22 NOTE — Assessment & Plan Note (Addendum)
Cont w/a leg pump Elevate legs

## 2022-01-05 ENCOUNTER — Other Ambulatory Visit (HOSPITAL_COMMUNITY): Payer: Self-pay | Admitting: *Deleted

## 2022-01-06 ENCOUNTER — Other Ambulatory Visit: Payer: Self-pay

## 2022-01-06 ENCOUNTER — Encounter (HOSPITAL_COMMUNITY)
Admission: RE | Admit: 2022-01-06 | Discharge: 2022-01-06 | Disposition: A | Discharge status: Home / Self Care | Payer: Medicare HMO | Source: Ambulatory Visit | Attending: Internal Medicine | Admitting: Internal Medicine

## 2022-01-06 DIAGNOSIS — N189 Chronic kidney disease, unspecified: Secondary | ICD-10-CM | POA: Diagnosis not present

## 2022-01-06 DIAGNOSIS — D631 Anemia in chronic kidney disease: Secondary | ICD-10-CM | POA: Diagnosis not present

## 2022-01-06 MED ORDER — SODIUM CHLORIDE 0.9 % IV SOLN
510.0000 mg | INTRAVENOUS | Status: DC
  Administered 2022-01-06: 510 mg via INTRAVENOUS
  Filled 2022-01-06: qty 510

## 2022-01-13 ENCOUNTER — Encounter (HOSPITAL_COMMUNITY)
Admission: RE | Admit: 2022-01-13 | Discharge: 2022-01-13 | Disposition: A | Discharge status: Home / Self Care | Payer: Medicare HMO | Source: Ambulatory Visit | Attending: Internal Medicine | Admitting: Internal Medicine

## 2022-01-13 ENCOUNTER — Other Ambulatory Visit: Payer: Self-pay

## 2022-01-13 DIAGNOSIS — N189 Chronic kidney disease, unspecified: Secondary | ICD-10-CM | POA: Diagnosis not present

## 2022-01-13 DIAGNOSIS — D631 Anemia in chronic kidney disease: Secondary | ICD-10-CM | POA: Diagnosis not present

## 2022-01-13 MED ORDER — SODIUM CHLORIDE 0.9 % IV SOLN
510.0000 mg | INTRAVENOUS | Status: DC
  Administered 2022-01-13: 10:00:00 510 mg via INTRAVENOUS
  Filled 2022-01-13: qty 510

## 2022-02-21 ENCOUNTER — Other Ambulatory Visit: Payer: Self-pay | Admitting: Internal Medicine

## 2022-02-22 NOTE — Telephone Encounter (Signed)
Per msg The original prescription was discontinued on 11/25/2021 by Debera Lat, CMA . Pls advise on refill.Marland KitchenJohny Chess ?

## 2022-02-24 ENCOUNTER — Ambulatory Visit (INDEPENDENT_AMBULATORY_CARE_PROVIDER_SITE_OTHER): Payer: Medicare HMO | Admitting: Internal Medicine

## 2022-02-24 ENCOUNTER — Encounter: Payer: Self-pay | Admitting: Internal Medicine

## 2022-02-24 VITALS — BP 150/102 | HR 60 | Temp 98.2°F | Ht 61.0 in | Wt 209.0 lb

## 2022-02-24 DIAGNOSIS — N184 Chronic kidney disease, stage 4 (severe): Secondary | ICD-10-CM

## 2022-02-24 DIAGNOSIS — I10 Essential (primary) hypertension: Secondary | ICD-10-CM

## 2022-02-24 DIAGNOSIS — I890 Lymphedema, not elsewhere classified: Secondary | ICD-10-CM | POA: Diagnosis not present

## 2022-02-24 DIAGNOSIS — R739 Hyperglycemia, unspecified: Secondary | ICD-10-CM

## 2022-02-24 DIAGNOSIS — K7460 Unspecified cirrhosis of liver: Secondary | ICD-10-CM

## 2022-02-24 LAB — COMPREHENSIVE METABOLIC PANEL
ALT: 15 U/L (ref 0–35)
AST: 21 U/L (ref 0–37)
Albumin: 3.8 g/dL (ref 3.5–5.2)
Alkaline Phosphatase: 126 U/L — ABNORMAL HIGH (ref 39–117)
BUN: 31 mg/dL — ABNORMAL HIGH (ref 6–23)
CO2: 32 mEq/L (ref 19–32)
Calcium: 9.8 mg/dL (ref 8.4–10.5)
Chloride: 100 mEq/L (ref 96–112)
Creatinine, Ser: 1.55 mg/dL — ABNORMAL HIGH (ref 0.40–1.20)
GFR: 32.41 mL/min — ABNORMAL LOW (ref >60.00)
Glucose, Bld: 100 mg/dL — ABNORMAL HIGH (ref 70–99)
Potassium: 3.7 mEq/L (ref 3.5–5.1)
Sodium: 140 mEq/L (ref 135–145)
Total Bilirubin: 1.9 mg/dL — ABNORMAL HIGH (ref 0.2–1.2)
Total Protein: 7 g/dL (ref 6.0–8.3)

## 2022-02-24 LAB — CBC WITH DIFFERENTIAL/PLATELET
Basophils Absolute: 0 10*3/uL (ref 0.0–0.1)
Basophils Relative: 1.1 % (ref 0.0–3.0)
Eosinophils Absolute: 0.1 10*3/uL (ref 0.0–0.7)
Eosinophils Relative: 3.9 % (ref 0.0–5.0)
HCT: 31.2 % — ABNORMAL LOW (ref 36.0–46.0)
Hemoglobin: 10.2 g/dL — ABNORMAL LOW (ref 12.0–15.0)
Lymphocytes Relative: 24.7 % (ref 12.0–46.0)
Lymphs Abs: 0.7 10*3/uL (ref 0.7–4.0)
MCHC: 32.8 g/dL (ref 30.0–36.0)
MCV: 95.2 fl (ref 78.0–100.0)
Monocytes Absolute: 0.4 10*3/uL (ref 0.1–1.0)
Monocytes Relative: 13.8 % — ABNORMAL HIGH (ref 3.0–12.0)
Neutro Abs: 1.5 10*3/uL (ref 1.4–7.7)
Neutrophils Relative %: 56.5 % (ref 43.0–77.0)
Platelets: 96 10*3/uL — ABNORMAL LOW (ref 150.0–400.0)
RBC: 3.28 Mil/uL — ABNORMAL LOW (ref 3.87–5.11)
RDW: 19.8 % — ABNORMAL HIGH (ref 11.5–15.5)
WBC: 2.7 10*3/uL — ABNORMAL LOW (ref 4.0–10.5)

## 2022-02-24 LAB — URIC ACID: Uric Acid, Serum: 8.8 mg/dL — ABNORMAL HIGH (ref 2.4–7.0)

## 2022-02-24 LAB — HEMOGLOBIN A1C: Hgb A1c MFr Bld: 5.1 % (ref 4.6–6.5)

## 2022-02-24 LAB — TSH: TSH: 4.86 u[IU]/mL (ref 0.35–5.50)

## 2022-02-24 NOTE — Patient Instructions (Addendum)
Blue-Emu cream to use 2-3 times a day ? ?Rice sock heating pad ? ? ?

## 2022-02-24 NOTE — Assessment & Plan Note (Signed)
On Coreg, Torsemide 

## 2022-02-24 NOTE — Assessment & Plan Note (Signed)
Check CBC 

## 2022-02-24 NOTE — Progress Notes (Signed)
? ?Subjective:  ?Patient ID: Katelyn Jackson, female    DOB: [birth year]  Age: 76 y.o. MRN: [id number] ? ?CC: No chief complaint on file. ? ? ?HPI ?Katelyn Jackson presents for L arm hematoma and swelling - started 2 wks ago ?F/u CRF, edema, CHF ?She is here with her daughter-in-law ? ?Outpatient Medications Prior to Visit  ?Medication Sig Dispense Refill  ? acetaminophen (TYLENOL) 325 MG tablet Take 2 tablets (650 mg total) by mouth every 6 (six) hours as needed for mild pain or moderate pain (or Fever >/= 101).    ? allopurinol (ZYLOPRIM) 100 MG tablet Take 0.5 tablets (50 mg total) by mouth daily. 90 tablet 1  ? apixaban (ELIQUIS) 5 MG TABS tablet Take 1 tablet (5 mg total) by mouth 2 (two) times daily. 180 tablet 3  ? bisacodyl (DULCOLAX) 10 MG suppository Place 10 mg rectally daily as needed for moderate constipation.    ? carvedilol (COREG) 6.25 MG tablet Take 1 tablet (6.25 mg total) by mouth 2 (two) times daily. 180 tablet 3  ? Cholecalciferol (VITAMIN D3) 50 MCG (2000 UT) capsule Take 1 capsule (2,000 Units total) by mouth daily. 100 capsule 3  ? Cyanocobalamin (VITAMIN B-12 PO) Take 2,000 mcg by mouth daily.    ? diclofenac Sodium (VOLTAREN) 1 % GEL APPLY FOUR GRAMS FOUR TIMES DAILY AS NEEDED (Patient taking differently: Apply 4 g topically 4 (four) times daily as needed (pain).) 100 g 5  ? docusate sodium (COLACE) 100 MG capsule Take 1 capsule (100 mg total) by mouth 2 (two) times daily. 90 capsule 3  ? lactulose (CHRONULAC) 10 GM/15ML solution Take 15 mLs (10 g total) by mouth 3 (three) times daily. 1000 mL 5  ? levothyroxine (SYNTHROID) 50 MCG tablet Take 1 tablet (50 mcg total) by mouth daily before breakfast. Overdue for Annual appt must see provider for future refills 90 tablet 3  ? lipase/protease/amylase (CREON) 12000-38000 units CPEP capsule Take 1 capsule (12,000 Units total) by mouth daily as needed (stomach problems). 270 capsule 3  ? magnesium oxide (MAG-OX) 400 MG tablet Take 1 tablet (400  mg total) by mouth 2 (two) times daily. 180 tablet 3  ? methocarbamol (ROBAXIN) 500 MG tablet Take 1 tablet (500 mg total) by mouth every 8 (eight) hours as needed for muscle spasms. 20 tablet 0  ? omeprazole (PRILOSEC) 40 MG capsule TAKE ONE CAPSULE BY MOUTH DAILY 90 capsule 3  ? OVER THE COUNTER MEDICATION Apply 1 application topically daily as needed (apply to buttocks  as needed for irritaiton). Intensive Skin Care Therapy    ? polyethylene glycol (MIRALAX / GLYCOLAX) 17 g packet Take 17 g by mouth 2 (two) times daily.    ? polyvinyl alcohol (LIQUIFILM TEARS) 1.4 % ophthalmic solution Place 1 drop into both eyes as needed for dry eyes.    ? potassium chloride (KLOR-CON) 8 MEQ tablet Take 1 tablet (8 mEq total) by mouth daily. 90 tablet 3  ? pravastatin (PRAVACHOL) 20 MG tablet Take 1 tablet (20 mg total) by mouth daily at 6 PM. 90 tablet 3  ? rifaximin (XIFAXAN) 550 MG TABS tablet Take 1 tablet (550 mg total) by mouth 2 (two) times daily. 180 tablet 1  ? sennosides-docusate sodium (SENOKOT-S) 8.6-50 MG tablet Take 1 tablet by mouth daily. 90 tablet 3  ? simethicone (MYLICON) 40 NG/2.9BM drops Take 0.3 mLs (20 mg total) by mouth 4 (four) times daily as needed for flatulence.    ? tamoxifen (NOLVADEX) 20 MG  tablet Take 1 tablet (20 mg total) by mouth daily. Will take after finishing Exemestane 90 tablet 3  ? torsemide (DEMADEX) 100 MG tablet Take 1 tablet (100 mg total) by mouth 2 (two) times daily. 180 tablet 3  ? traMADol (ULTRAM) 50 MG tablet Take by mouth.    ? ?No facility-administered medications prior to visit.  ? ? ?ROS: ?Review of Systems  ?Constitutional:  Positive for fatigue and unexpected weight change. Negative for activity change, appetite change and chills.  ?HENT:  Negative for congestion, mouth sores and sinus pressure.   ?Eyes:  Negative for visual disturbance.  ?Respiratory:  Positive for shortness of breath. Negative for cough and chest tightness.   ?Cardiovascular:  Positive for leg swelling.   ?Gastrointestinal:  Negative for abdominal pain and nausea.  ?Genitourinary:  Negative for difficulty urinating, frequency and vaginal pain.  ?Musculoskeletal:  Positive for arthralgias and gait problem. Negative for back pain.  ?Skin:  Positive for color change. Negative for pallor and rash.  ?Neurological:  Positive for weakness. Negative for dizziness, tremors, numbness and headaches.  ?Hematological:  Bruises/bleeds easily.  ?Psychiatric/Behavioral:  Negative for confusion, sleep disturbance and suicidal ideas.   ? ?Objective:  ?BP (!) 150/102 (BP Location: Left Arm, Patient Position: Sitting, Cuff Size: Large)   Pulse 60   Temp 98.2 ?F (36.8 ?C) (Oral)   Ht '5\' 1"'$  (1.549 m)   Wt 209 lb (94.8 kg)   LMP  (LMP Unknown)   SpO2 98%   BMI 39.49 kg/m?  ? ?BP Readings from Last 3 Encounters:  ?02/24/22 (!) 150/102  ?01/13/22 137/88  ?01/06/22 (!) 105/58  ? ? ?Wt Readings from Last 3 Encounters:  ?02/24/22 209 lb (94.8 kg)  ?01/13/22 208 lb 2 oz (94.4 kg)  ?01/06/22 208 lb (94.3 kg)  ? ? ?Physical Exam ?Constitutional:   ?   General: She is not in acute distress. ?   Appearance: She is well-developed. She is obese. She is not toxic-appearing.  ?HENT:  ?   Head: Normocephalic.  ?   Right Ear: External ear normal.  ?   Left Ear: External ear normal.  ?   Nose: Nose normal.  ?Eyes:  ?   General:     ?   Right eye: No discharge.     ?   Left eye: No discharge.  ?   Conjunctiva/sclera: Conjunctivae normal.  ?   Pupils: Pupils are equal, round, and reactive to light.  ?Neck:  ?   Thyroid: No thyromegaly.  ?   Vascular: No JVD.  ?   Trachea: No tracheal deviation.  ?Cardiovascular:  ?   Rate and Rhythm: Normal rate and regular rhythm.  ?   Heart sounds: Normal heart sounds.  ?Pulmonary:  ?   Effort: No respiratory distress.  ?   Breath sounds: No stridor. No wheezing.  ?Abdominal:  ?   General: Bowel sounds are normal. There is no distension.  ?   Palpations: Abdomen is soft. There is no mass.  ?   Tenderness: There is  no abdominal tenderness. There is no guarding or rebound.  ?Musculoskeletal:     ?   General: No tenderness.  ?   Cervical back: Normal range of motion and neck supple. No rigidity.  ?Lymphadenopathy:  ?   Cervical: No cervical adenopathy.  ?Skin: ?   Findings: No erythema or rash.  ?Neurological:  ?   Cranial Nerves: No cranial nerve deficit.  ?   Motor: No abnormal muscle  tone.  ?   Coordination: Coordination normal.  ?   Deep Tendon Reflexes: Reflexes normal.  ?Psychiatric:     ?   Behavior: Behavior normal.     ?   Thought Content: Thought content normal.     ?   Judgment: Judgment normal.  ?Left posterior arm hematoma, large, purple in color, left hand is swollen ?Severe bilateral feet and lower leg edema ?The patient is morbidly obese ?She is in a wheelchair ? ?Lab Results  ?Component Value Date  ? WBC 2.7 (L) 02/24/2022  ? HGB 10.2 (L) 02/24/2022  ? HCT 31.2 (L) 02/24/2022  ? PLT 96.0 (L) 02/24/2022  ? GLUCOSE 100 (H) 02/24/2022  ? CHOL 163 11/25/2021  ? TRIG 66.0 11/25/2021  ? HDL 77.50 11/25/2021  ? [place] 72 11/25/2021  ? ALT 15 02/24/2022  ? AST 21 02/24/2022  ? NA 140 02/24/2022  ? K 3.7 02/24/2022  ? CL 100 02/24/2022  ? CREATININE 1.55 (H) 02/24/2022  ? BUN 31 (H) 02/24/2022  ? CO2 32 02/24/2022  ? TSH 4.86 02/24/2022  ? INR 2.9 (H) 07/30/2021  ? HGBA1C 5.1 02/24/2022  ? ? ?No results found. ? ?Assessment & Plan:  ? ?Problem List Items Addressed This Visit   ? ? Hyperglycemia - Primary  ? Relevant Orders  ? Hemoglobin A1c (Completed)  ? Liver cirrhosis ([place])  ?  Check CBC ? ?  ?  ? Relevant Orders  ? CBC with Differential/Platelet (Completed)  ? Comprehensive metabolic panel (Completed)  ? Hemoglobin A1c (Completed)  ? TSH (Completed)  ? Uric acid (Completed)  ? CKD (chronic kidney disease) stage 4, GFR 15-29 ml/min (HCC)  ?   Monitor GFR  ?F/u w/nephrilogy ?  ?  ? Relevant Orders  ? CBC with Differential/Platelet (Completed)  ? Comprehensive metabolic panel (Completed)  ? Hemoglobin A1c (Completed)   ? TSH (Completed)  ? Uric acid (Completed)  ? Lymphedema  ?  Left arm swelling is worse due to spontaneous hematoma of the upper arm.  The patient was asked to elevate legs, L arm ?  ?  ? Relevant Orders  ?

## 2022-02-24 NOTE — Assessment & Plan Note (Addendum)
Left arm swelling is worse due to spontaneous hematoma of the upper arm.  The patient was asked to elevate legs, L arm ?

## 2022-02-24 NOTE — Assessment & Plan Note (Signed)
Monitor GFR  ?F/u w/nephrilogy ?

## 2022-02-27 NOTE — Assessment & Plan Note (Signed)
Difficult to manage.  Low-carb diet to continue ?

## 2022-03-01 DIAGNOSIS — I4821 Permanent atrial fibrillation: Secondary | ICD-10-CM | POA: Diagnosis not present

## 2022-03-09 ENCOUNTER — Other Ambulatory Visit: Payer: Self-pay

## 2022-03-09 DIAGNOSIS — D638 Anemia in other chronic diseases classified elsewhere: Secondary | ICD-10-CM

## 2022-03-09 DIAGNOSIS — C50412 Malignant neoplasm of upper-outer quadrant of left female breast: Secondary | ICD-10-CM

## 2022-03-10 ENCOUNTER — Inpatient Hospital Stay: Payer: Medicare HMO | Attending: Hematology | Admitting: Hematology

## 2022-03-10 ENCOUNTER — Inpatient Hospital Stay: Payer: Medicare HMO

## 2022-03-10 VITALS — BP 133/60 | HR 59 | Temp 98.1°F | Resp 18

## 2022-03-10 DIAGNOSIS — Z1231 Encounter for screening mammogram for malignant neoplasm of breast: Secondary | ICD-10-CM | POA: Diagnosis not present

## 2022-03-10 DIAGNOSIS — D631 Anemia in chronic kidney disease: Secondary | ICD-10-CM | POA: Diagnosis not present

## 2022-03-10 DIAGNOSIS — M1990 Unspecified osteoarthritis, unspecified site: Secondary | ICD-10-CM | POA: Diagnosis not present

## 2022-03-10 DIAGNOSIS — Z79899 Other long term (current) drug therapy: Secondary | ICD-10-CM | POA: Insufficient documentation

## 2022-03-10 DIAGNOSIS — N184 Chronic kidney disease, stage 4 (severe): Secondary | ICD-10-CM | POA: Insufficient documentation

## 2022-03-10 DIAGNOSIS — I129 Hypertensive chronic kidney disease with stage 1 through stage 4 chronic kidney disease, or unspecified chronic kidney disease: Secondary | ICD-10-CM | POA: Diagnosis not present

## 2022-03-10 DIAGNOSIS — M25569 Pain in unspecified knee: Secondary | ICD-10-CM | POA: Insufficient documentation

## 2022-03-10 DIAGNOSIS — Z923 Personal history of irradiation: Secondary | ICD-10-CM | POA: Diagnosis not present

## 2022-03-10 DIAGNOSIS — N6002 Solitary cyst of left breast: Secondary | ICD-10-CM | POA: Diagnosis not present

## 2022-03-10 DIAGNOSIS — I272 Pulmonary hypertension, unspecified: Secondary | ICD-10-CM | POA: Insufficient documentation

## 2022-03-10 DIAGNOSIS — Z7901 Long term (current) use of anticoagulants: Secondary | ICD-10-CM | POA: Diagnosis not present

## 2022-03-10 DIAGNOSIS — Z880 Allergy status to penicillin: Secondary | ICD-10-CM | POA: Insufficient documentation

## 2022-03-10 DIAGNOSIS — I482 Chronic atrial fibrillation, unspecified: Secondary | ICD-10-CM | POA: Diagnosis not present

## 2022-03-10 DIAGNOSIS — Z170 Estrogen receptor positive status [ER+]: Secondary | ICD-10-CM | POA: Diagnosis not present

## 2022-03-10 DIAGNOSIS — Z79890 Hormone replacement therapy (postmenopausal): Secondary | ICD-10-CM | POA: Diagnosis not present

## 2022-03-10 DIAGNOSIS — E039 Hypothyroidism, unspecified: Secondary | ICD-10-CM | POA: Insufficient documentation

## 2022-03-10 DIAGNOSIS — E669 Obesity, unspecified: Secondary | ICD-10-CM | POA: Insufficient documentation

## 2022-03-10 DIAGNOSIS — N179 Acute kidney failure, unspecified: Secondary | ICD-10-CM | POA: Diagnosis not present

## 2022-03-10 DIAGNOSIS — R600 Localized edema: Secondary | ICD-10-CM | POA: Diagnosis not present

## 2022-03-10 DIAGNOSIS — K7460 Unspecified cirrhosis of liver: Secondary | ICD-10-CM | POA: Diagnosis not present

## 2022-03-10 DIAGNOSIS — E785 Hyperlipidemia, unspecified: Secondary | ICD-10-CM | POA: Insufficient documentation

## 2022-03-10 DIAGNOSIS — C50412 Malignant neoplasm of upper-outer quadrant of left female breast: Secondary | ICD-10-CM | POA: Insufficient documentation

## 2022-03-10 DIAGNOSIS — I890 Lymphedema, not elsewhere classified: Secondary | ICD-10-CM | POA: Insufficient documentation

## 2022-03-10 DIAGNOSIS — Z9049 Acquired absence of other specified parts of digestive tract: Secondary | ICD-10-CM | POA: Insufficient documentation

## 2022-03-10 DIAGNOSIS — E876 Hypokalemia: Secondary | ICD-10-CM | POA: Diagnosis not present

## 2022-03-10 DIAGNOSIS — K5909 Other constipation: Secondary | ICD-10-CM | POA: Insufficient documentation

## 2022-03-10 DIAGNOSIS — M170 Bilateral primary osteoarthritis of knee: Secondary | ICD-10-CM | POA: Diagnosis not present

## 2022-03-10 NOTE — Progress Notes (Signed)
?[place]   ?[phone] [phone]   ?Clinic Follow up Note  ? ?Patient Care Team: ?Plotnikov, [name], MD as PCP - General (Internal Medicine) ?[name], MD as Consulting Physician (General Surgery) ?Truitt Merle, MD as Consulting Physician (Hematology) ?Kyung Rudd, MD as Consulting Physician (Radiation Oncology) ?Estanislado Emms, MD (Inactive) as Consulting Physician (Nephrology) ?Lorretta Harp, MD as Consulting Physician (Cardiology) ?Verner Chol, MD as Consulting Physician (Sports Medicine) ?Gardenia Phlegm, NP as Nurse Practitioner (Hematology and Oncology) ?[facility] as Consulting Physician (Ophthalmology) ?Tomasa Blase, [facility] as Pharmacist (Pharmacist) ?Antionette Char as Physician Assistant (Radiology) ? ?Date of Service:  03/10/2022 ? ?CHIEF COMPLAINT: f/u of left breast cancer ? ?CURRENT THERAPY:  ?Antiestrogen therapy, started 08/2017, currently tamoxifen since 09/2021 due to osteoporosis ? ?ASSESSMENT & PLAN:  ?Katelyn Jackson is a 76 y.o. post-menopausal female with  ? ?1. Malignant neoplasm of upper-outer quadrant of left breast, invasive ductal carcinoma,  stage IA (pT1b(m)N0), grade 2, ER+,PR+,HER2-, oncotype RS 18 ?-diagnosed in 02/2017, S/p left lumpectomy on 04/04/17, path showing 0.9 cm multifocal IDC, margins and lymph nodes were negative. ?-S/p adjuvant radiation 06/06/17 - 07/19/17 with Dr. Lisbeth Renshaw ?-She started Exemestane in 08/2017. We switched her to tamoxifen in 09/2021 due to her worsening bone density. ?-most recent screening mammogram from 05/25/21 was negative ?-From a breast cancer standpoint, she is doing well. Physical exam showed some left breast lymphedema, otherwise unremarkable. There is no concern for cancer recurrence.  ?-I will refer her to physical therapy for her breast and left arm lymphedema. Otherwise I will see her back in 6 months. ? ?2. Liver cirrhosis  ?-Found on ultrasound in  September 2022, hepatitis status B and C were negative, etiology unclear. ?-she was seen by Dr. Therisa Jackson while in the hospital in 07/2021. I will refer her back for follow up. ?  ?3. Chronic atrial fibrillation, hyperlipidemia, HTN, pulmonary hypertension, obesity, hypothyroidism ?-Managed by her PCP and Cardiologist (Dr. Gwenlyn Found) ?-On Lasix, Lovastatin, Diovan-HCT, Coumadin 5 mg, and synthroid. ?  ?4. Anemia of chronic disease (CKD) and iron deficiency ?-She previously took oral ferrous sulfate. ?-She received two doses of Feraheme in 04/2021 and in 01/2022. ?-She follows with nephrologist at [facility] every 6 months  ?  ?5. Osteoarthritis, arthralgia, osteoporosis ?-followed by ortho at [facility] ?-She receives knee injections q44month with ortho for arthritis ?-08/2017 DEXA showed osteopenia (-1.4). Repeat 08/2020 showed borderline osteoporosis (-2.5) ?-she is taking vit D. She was previously taking calcium, but she notes her nephrologist took her off this due to her CKD.  ?  ?6. Bilateral LE lymphedema ?-She notes her LE edema significantly worsened since 2019. She uses machine to massage her legs BID. ?-due to knee pain and heaviness of her legs, she does not walk much. ?-She continues to have significant lymphedema to her bilateral legs. ?  ?  ?PLAN: ?-continue tamoxifen ?-next mammogram due 05/2022, I ordered today ?-referral to Dr. KTherisa Doynefor liver cirrhosis  ?-referral to PT for left breast and arm lymphedema ?-Lab and F/u in 6 months ? ? ?No problem-specific Assessment & Plan notes found for this encounter. ? ? ?SUMMARY OF ONCOLOGIC HISTORY: ?Oncology History Overview Note  ?Cancer Staging ?Malignant neoplasm of upper-outer quadrant of left breast in female, estrogen receptor positive ([place] ?Staging form: Breast, AJCC 8th Edition ?- Clinical stage from 02/06/2017: Stage IA (cT1b(m), cN0, cM0, G1, ER: Positive, PR: Positive, HER2: Negative) - Signed by FTruitt Merle MD on  02/14/2017 ?- Pathologic stage from  04/04/2017: Stage IA (pT1b(m), pN0, cM0, G2, ER: Positive, PR: Positive, HER2: Negative, Oncotype DX score: 18) - Signed by Truitt Merle, MD on 06/30/2017 ? ? ?  ?Malignant neoplasm of upper-outer quadrant of left breast in female, estrogen receptor positive ([place])  ?01/26/2017 Mammogram  ? Screening mammogram on 01/26/17 showed possible asymmetries in the left breast.  ? ?  ?02/02/2017 Mammogram  ? Diagnostic mammogram and [place]: ?1. An irregular hypoechoic mass in the left breast at 1 o'clock 7 cm ?from the nipple measuring 6 x 3 x 4 mm.  ?2. There is an irregular ?hypoechoic mass in left breast at 3 o'clock 7 cm from the nipple ?measuring 4 x 4 x 3 mm. ? 3. There is a subtle hypoechoic mass in the left breast at 2:30 5 cm from the nipple measuring 3 x 3 x 3 mm ?4. There are multiple other small cysts in the left breast  ?5. Left axilla (-) on [place]  ? ?  ?02/06/2017 Initial Biopsy  ? Diagnosis ?1. Breast, left, needle core biopsy, 1:00 o'clock, 7 CMFN ?- INVASIVE DUCTAL CARCINOMA, SEE COMMENT. ?- DUCTAL CARCINOMA IN SITU. ?2. Breast, left, needle core biopsy, 3:00 o'clock, 7 CMFN ?- INVASIVE DUCTAL CARCINOMA, SEE COMMENT. ?- DUCTAL CARCINOMA IN SITU. ?Microscopic Comment ?1. The carcinoma in parts #1 and 2 appear morphologically similar and grade 1. ? ?  ?02/06/2017 Receptors her2  ? Both biopsy ER 100%+, PR 100%+, HER2-, Ki67 10% ? ?  ?02/06/2017 Initial Diagnosis  ? Malignant neoplasm of upper-outer quadrant of left breast in female, estrogen receptor positive ([place]) ? ?  ?04/04/2017 Surgery  ? Left breast lumpectomy and SLN biopsy  ? ?  ?04/04/2017 Pathology Results  ? Diagnosis ?1. Breast, lumpectomy, Left ?- MULTIFOCAL INVASIVE DUCTAL CARCINOMA, NOTTINGHAM GRADE 2 OF 3, 0.9 CM ?- DUCTAL CARCINOMA IN SITU, LOW GRADE (<1MM FROM MEDIAL MARGIN) ?- FIBROCYSTIC AND COLUMNAR CELL CHANGE ?- CALCIFICATIONS ASSOCIATED WITH CARCINOMA AND BENIGN DISEASE ?- MARGINS UNINVOLVED BY CARCINOMA ?- PREVIOUS BIOPSY SITE CHANGES ?- SEE ONCOLOGY TABLE  BELOW ?2. Lymph node, sentinel, biopsy, Left Axillary #1 ?- NO CARCINOMA IDENTIFIED IN ONE LYMPH NODE (0/1) ?3. Lymph node, sentinel, biopsy, Left Axillary #2 ?- NO CARCINOMA IDENTIFIED IN ONE LYMPH NODE (0/1) ? ?  ?04/04/2017 Oncotype testing  ? RS 18, which predicts 10-year distant recurrence risk of 12% with tamoxifen  ? ?  ?04/26/2017 Surgery  ? Re-excision for close margin, final pathyology was negative for cancer  ? ?  ?06/06/2017 - 07/19/2017 Radiation Therapy  ? The Left breast was treated to 42.5 Gy in 17 fractions at 2.5 Gy per fraction. ?2. The Left breast was boosted to 7.5 Gy in 3 fractions at 2.5 Gy per fraction.  ? ? ?  ?06/18/2017 - 06/23/2017 Hospital Admission  ? Admit date: 06/18/17 ?Admission diagnosis: Intractable lower back pain- sciatica  ?Additional comments: Sent home with course of steroids and referral for f/u for back injections. Did have AKI w/ CR at 2/0, up from her baseline of 1.6. This slowed with IVF. Hypokalemia improved with K replacement. Radiotherapy was stopped at that time as she was unable to lay on her back due to pain.  ? ?  ?08/2017 -  Anti-estrogen oral therapy  ? Exemestane 25 mg dialy starting 08/2017  ? ?  ?08/28/2017 Imaging  ? BONE DENSITY SCAN  ?ASSESSMENT: ?The BMD measured at Femur Neck Left is 0.848 g/cm2 with a T-score of -1.4.  ? ?  DualFemur Neck Left 08/28/2017    71.7         -1.4    0.848 g/cm2 ? AP Spine  L1-L3     08/28/2017    71.7         -1.1    1.052 g/cm2 ? ?  ?01/29/2018 Mammogram  ? IMPRESSION: ?1. No mammographic evidence of breast malignancy. ?2. Surgical and radiation changes within the LEFT breast. ? ?  ? ? ? ?INTERVAL HISTORY:  ?Katelyn Jackson is here for a follow up of breast cancer. She was last seen by me on 09/10/21. She presents to the clinic accompanied by her daughter-in-law, whom she is currently living with. ?She continues to struggle with leg edema. Her daughter-in-law explains that Tamaiya does not walk around much but uses a walker to get  around. She also reports a recent fall, causing some bruises on her arms. ?They note she received steroid injections to her knees today. ?They also report she has chronic constipation. ?  ?All other systems were r

## 2022-03-12 ENCOUNTER — Encounter: Payer: Self-pay | Admitting: Hematology

## 2022-03-14 ENCOUNTER — Other Ambulatory Visit: Payer: Medicare HMO

## 2022-03-14 ENCOUNTER — Ambulatory Visit: Payer: Medicare HMO | Admitting: Hematology

## 2022-03-17 DIAGNOSIS — M170 Bilateral primary osteoarthritis of knee: Secondary | ICD-10-CM | POA: Diagnosis not present

## 2022-03-22 NOTE — Therapy (Incomplete)
?OUTPATIENT PHYSICAL THERAPY ONCOLOGY EVALUATION ? ?Patient Name: Katelyn Jackson ?MRN: [id number] ?DOB:1946-03-06, 76 y.o., female ?Today's Date: 03/22/2022 ? ? ? ?Past Medical History:  ?Diagnosis Date  ? Arthritis   ? Breast lesion 12/07/2021  ? Cancer [facility])   ? breast cancer  ? Chronic atrial fibrillation (HCC)   ? Chronic kidney disease   ? sees Dr Florene Glen  ? 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 Cramps, muscle, general   ? Dry skin 09/22/2021  ? Dyspnea on exertion   ? Dysrhythmia   ? a-fib,   ? ESBL E. coli carrier 09/22/2021  ? GERD (gastroesophageal reflux disease)   ? Headache   ? Herpes labialis 08/19/2021  ? Hyperlipidemia   ? Hypertension   ? Hypothyroidism   ? Lymphedema   ? Moderate to severe pulmonary hypertension (HCC)   ? Obesity   ? Osteoarthritis of right knee 08/19/2021  ? Personal history of radiation therapy   ? Pneumonia 12/2020  ? Presence of permanent cardiac pacemaker 01/21/2021  ? for bradycardia   ? Syncope 12/2020  ? needed a pacemaker  ? ?Past Surgical History:  ?Procedure Laterality Date  ? APPENDECTOMY    ? BREAST BIOPSY Left 2018  ? BREAST LUMPECTOMY Left 04/04/2017  ? x3  ? BREAST LUMPECTOMY WITH NEEDLE LOCALIZATION AND AXILLARY SENTINEL LYMPH NODE BX Left 04/04/2017  ? Procedure: BREAST LUMPECTOMY WITH NEEDLE LOCALIZATION x3 AND AXILLARY SENTINEL LYMPH NODE BX;  Surgeon: [name], MD;  Location: [place];  Service: General;  Laterality: Left;  ? CATARACT EXTRACTION W/ INTRAOCULAR LENS  IMPLANT, BILATERAL Bilateral 2018  ? COLONOSCOPY    ? EYE SURGERY    ? GLAUCOMA SURGERY Bilateral 2018  ? HARDWARE REMOVAL Right 07/26/2021  ? Procedure: RIGHT SHOULDER HARDWARE REMOVAL WITH WASHOUT;  Surgeon: Hiram Gash, MD;  Location: [facility];  Service: Orthopedics;  Laterality: Right;  ? INSERT / REPLACE / REMOVE PACEMAKER  01/21/2021  ? IR FLUORO GUIDE CV LINE LEFT  07/29/2021  ? IR REMOVAL TUN CV CATH W/O FL  09/07/2021  ? IR US GUIDE BX ASP/DRAIN  07/16/2021  ?  RE-EXCISION OF BREAST CANCER,SUPERIOR MARGINS Left 04/26/2017  ? Procedure: RE-EXCISION OF LEFT BREAST CANCER;  Surgeon: [name], MD;  Location: [place];  Service: General;  Laterality: Left;  ? SHOULDER HEMI-ARTHROPLASTY Right 03/17/2021  ? Procedure: SHOULDER HEMI-ARTHROPLASTY;  Surgeon: Hiram Gash, MD;  Location: [facility];  Service: Orthopedics;  Laterality: Right;  ? ?Patient Active Problem List  ? Diagnosis Date Noted  ? Cardiac pacemaker in situ 12/10/2021  ? Morbid obesity with BMI of 45.0-49.9, adult ([place]) 12/10/2021  ? Breast lesion 12/07/2021  ? Dry skin 09/22/2021  ? 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 Osteoarthritis of right knee 08/19/2021  ? Herpes labialis 08/19/2021  ? Liver cirrhosis ([place]) 07/22/2021  ? Abscess   ? Prosthetic joint infection ([place])   ? Abscess of right shoulder 07/21/2021  ? History of pacemaker 07/21/2021  ? Pressure injury of skin 07/03/2021  ? Hypothyroidism   ? Cellulitis   ? Postoperative anemia due to acute blood loss 03/19/2021  ? Dyspnea 03/19/2021  ? Symptomatic anemia 03/19/2021  ? Status post right shoulder hemiarthroplasty 03/17/2021  ? Bradycardia 01/05/2021  ? CAP (community acquired pneumonia) 01/05/2021  ? CKD (chronic kidney disease) stage 4, GFR 15-29 ml/min (HCC) 01/05/2021  ? Bilateral pain of leg and foot 10/08/2020  ? Abscess of left thumb 06/17/2020  ? Cellulitis and abscess  of left leg 06/17/2020  ? Herpes zoster 02/18/2020  ? GERD (gastroesophageal reflux disease) 11/25/2019  ? Wheezing 10/26/2017  ? Sciatica of left side 08/29/2017  ? Morbid obesity ([place]) 07/11/2017  ? Hyperglycemia 07/11/2017  ? Low back pain 06/19/2017  ? Acute renal failure superimposed on stage 3b chronic kidney disease ([place]) 06/19/2017  ? Hypokalemia 06/19/2017  ? Acute lower UTI 06/19/2017  ? Anemia of chronic disease 02/16/2017  ? Malignant neoplasm of upper-outer quadrant of left breast in female, estrogen receptor positive ([place]) 02/10/2017  ?  Essential hypertension 04/05/2016  ? Dyslipidemia 04/05/2016  ? Pulmonary hypertension ([place]) 04/05/2016  ? Lymphedema 04/05/2016  ? ? ?PCP: *** ? ?REFERRING PROVIDER: [name] MD ? ?REFERRING DIAG: *** ? ?THERAPY DIAG:  ?No diagnosis found. ? ?ONSET DATE: 08/07/2017 ? ?SUBJECTIVE                                                                                                                                                                                          ? ?SUBJECTIVE STATEMENT: ? ?PERTINENT HISTORY:  ? ? stage 4 kidney failure,Bilateral LE swelling, pulmonary hypertension, atrial fibrillation, left breast cancer with lumpectomy and SLNB on 04/04/17, pt has had chemotherapy and radiation, exemestane taking daily  ? ? ? ?PAIN:  ?Are you having pain? {yes/no:20286} ?NPRS scale: ***/10 ?Pain location: *** ?Pain orientation: {Pain Orientation:25161}  ?PAIN TYPE: {type:313116} ?Pain description: {PAIN DESCRIPTION:21022940}  ?Aggravating factors: *** ?Relieving factors: *** ? ?PRECAUTIONS: stage 4 kidney failure,Bilateral LE swelling, pulmonary hypertension, atrial fibrillation, left breast cancer with lumpectomy and SLNB on 04/04/17 ? ?WEIGHT BEARING RESTRICTIONS {Yes ***/No:24003} ? ?FALLS:  ?Has patient fallen in last 6 months? {fallsyesno:27318} ? ?LIVING ENVIRONMENT: ?Lives with: {[facility] lives with:25569::"lives with their family"} ?Lives in: {Lives in:25570} ?Stairs: {yes/no:20286}; {Stairs:24000} ?Has following equipment at home: {Assistive devices:23999} ? ?OCCUPATION: *** ? ?LEISURE: *** ? ?HAND DOMINANCE : {RIGHT/LEFT:21944}  ? ?PRIOR LEVEL OF FUNCTION: {PLOF:24004} ? ?PATIENT GOALS *** ? ? ?OBJECTIVE ? ?COGNITION: ? Overall cognitive status: {cognition:24006}  ? ?PALPATION: *** ? ?OBSERVATIONS / OTHER ASSESSMENTS: *** ? ?SENSATION: ? Light touch: {intact/deficits:24005} ? Stereognosis: {intact/deficits:24005} ? Hot/Cold: {intact/deficits:24005} ? Proprioception: {intact/deficits:24005} ? ?POSTURE:  *** ? ?UPPER EXTREMITY AROM/PROM: ? ?A/PROM RIGHT  03/22/2022 ?  ?Shoulder extension   ?Shoulder flexion   ?Shoulder abduction   ?Shoulder internal rotation   ?Shoulder external rotation   ?  (Blank rows = not tested) ? ?A/PROM LEFT  03/22/2022  ?Shoulder extension   ?Shoulder flexion   ?Shoulder abduction   ?Shoulder internal rotation   ?Shoulder external rotation   ?  (Blank rows = not tested) ? ? ?CERVICAL AROM: ?All  within normal limits:  ? ? Percent limited  ?Flexion   ?Extension   ?Right lateral flexion   ?Left lateral flexion   ?Right rotation   ?Left rotation   ? ? ? ?UPPER EXTREMITY STRENGTH: *** ? ?LYMPHEDEMA ASSESSMENTS:  ? ?SURGERY TYPE/DATE: 04/04/2017, Left lumpectomy with SLNB ? ?NUMBER OF LYMPH NODES REMOVED: 2 ? ?CHEMOTHERAPY: yes ? ?RADIATION:yes ? ?HORMONE TREATMENT: yes ? ?INFECTIONS: *** ? ?LYMPHEDEMA ASSESSMENTS:  ? ?[place] RIGHT  03/22/2022  ?10 cm proximal to olecranon process   ?Olecranon process   ?10 cm proximal to ulnar styloid process   ?Just proximal to ulnar styloid process   ?Across hand at thumb web space   ?At base of 2nd digit   ?(Blank rows = not tested) ? ?[place] LEFT  03/22/2022  ?10 cm proximal to olecranon process   ?Olecranon process   ?10 cm proximal to ulnar styloid process   ?Just proximal to ulnar styloid process   ?Across hand at thumb web space   ?At base of 2nd digit   ?(Blank rows = not tested) ? ? ? ? ? ? ? ? ?QUICK DASH SURVEY: *** ? ? ?TODAY'S TREATMENT  ?*** ? ?PATIENT EDUCATION:  ?Education details: *** ?Person educated: {Person educated:25204} ?Education method: {Education Method:25205} ?Education comprehension: {Education Comprehension:25206} ? ? ?HOME EXERCISE PROGRAM: ?*** ? ?ASSESSMENT: ? ?CLINICAL IMPRESSION: ?Patient is a 76 y.o. female who was seen today for physical therapy evaluation and treatment for left UE lymphedema. Swelling has been present since 2018.  ? ? ?OBJECTIVE IMPAIRMENTS cardiopulmonary status limiting activity, decreased activity  tolerance, decreased knowledge of condition, decreased mobility, increased edema, impaired UE functional use, and postural dysfunction.  ? ?ACTIVITY LIMITATIONS community activity, meal prep, laundry, and shopping.  ?

## 2022-03-23 ENCOUNTER — Ambulatory Visit: Payer: Medicare HMO

## 2022-03-24 DIAGNOSIS — M170 Bilateral primary osteoarthritis of knee: Secondary | ICD-10-CM | POA: Diagnosis not present

## 2022-03-28 DIAGNOSIS — Z1211 Encounter for screening for malignant neoplasm of colon: Secondary | ICD-10-CM | POA: Diagnosis not present

## 2022-03-28 DIAGNOSIS — K7460 Unspecified cirrhosis of liver: Secondary | ICD-10-CM | POA: Diagnosis not present

## 2022-04-01 ENCOUNTER — Ambulatory Visit (INDEPENDENT_AMBULATORY_CARE_PROVIDER_SITE_OTHER): Payer: Medicare HMO | Admitting: Podiatry

## 2022-04-01 ENCOUNTER — Encounter: Payer: Self-pay | Admitting: Podiatry

## 2022-04-01 DIAGNOSIS — D689 Coagulation defect, unspecified: Secondary | ICD-10-CM | POA: Diagnosis not present

## 2022-04-01 DIAGNOSIS — B351 Tinea unguium: Secondary | ICD-10-CM

## 2022-04-01 DIAGNOSIS — I890 Lymphedema, not elsewhere classified: Secondary | ICD-10-CM | POA: Diagnosis not present

## 2022-04-01 NOTE — Progress Notes (Signed)
This patient returns to my office for at risk foot care.  This patient requires this care by a professional since this patient will be at risk due to having lymphedema and coagulation defect.  Patient is taking eliquis.  This patient is unable to cut nails herself since the patient cannot reach her nails.These nails are painful walking and wearing shoes.  This patient presents for at risk foot care today.  She presents to the office in a wheelchair with her daughter.  General Appearance  Alert, conversant and in no acute stress.  Vascular  Dorsalis pedis and posterior tibial  pulses are not palpable  due to lymphedema  bilaterally.  Capillary return is within normal limits  bilaterally. Temperature is within normal limits  bilaterally.  Neurologic  Senn-Weinstein monofilament wire test within normal limits  bilaterally. Muscle power within normal limits bilaterally.  Nails Thick disfigured discolored nails with subungual debris  from hallux to fifth toes bilaterally. No evidence of bacterial infection or drainage bilaterally.  Orthopedic  No limitations of motion  feet .  No crepitus or effusions noted.  No bony pathology or digital deformities noted.  Skin  normotropic skin with no porokeratosis noted bilaterally.  No signs of infections or ulcers noted.     Onychomycosis  Pain in right toes  Pain in left toes  Consent was obtained for treatment procedures.   Mechanical debridement of nails 1-5  bilaterally performed with a nail nipper.  Filed with dremel without incident.  There was fluid on her napkin due to lymphedema.   Return office visit   4 months                   Told patient to return for periodic foot care and evaluation due to potential at risk complications.   Gardiner Barefoot DPM

## 2022-04-10 DIAGNOSIS — Z4509 Encounter for adjustment and management of other cardiac device: Secondary | ICD-10-CM | POA: Diagnosis not present

## 2022-04-20 ENCOUNTER — Ambulatory Visit: Payer: Medicare HMO | Admitting: Physical Therapy

## 2022-05-18 ENCOUNTER — Other Ambulatory Visit: Payer: Self-pay | Admitting: Internal Medicine

## 2022-05-26 ENCOUNTER — Ambulatory Visit: Payer: Medicare HMO | Admitting: Internal Medicine

## 2022-05-26 ENCOUNTER — Ambulatory Visit: Payer: Medicare HMO

## 2022-06-01 ENCOUNTER — Ambulatory Visit
Admission: RE | Admit: 2022-06-01 | Discharge: 2022-06-01 | Disposition: A | Discharge status: Home / Self Care | Payer: Medicare HMO | Source: Ambulatory Visit | Attending: Hematology | Admitting: Hematology

## 2022-06-01 DIAGNOSIS — Z1231 Encounter for screening mammogram for malignant neoplasm of breast: Secondary | ICD-10-CM | POA: Diagnosis not present

## 2022-06-13 ENCOUNTER — Inpatient Hospital Stay: Payer: Medicare HMO | Admitting: Nurse Practitioner

## 2022-06-13 ENCOUNTER — Inpatient Hospital Stay: Payer: Medicare HMO

## 2022-06-15 ENCOUNTER — Ambulatory Visit (INDEPENDENT_AMBULATORY_CARE_PROVIDER_SITE_OTHER): Payer: Medicare HMO

## 2022-06-15 ENCOUNTER — Encounter: Payer: Self-pay | Admitting: Internal Medicine

## 2022-06-15 ENCOUNTER — Telehealth: Payer: Self-pay | Admitting: *Deleted

## 2022-06-15 ENCOUNTER — Ambulatory Visit (INDEPENDENT_AMBULATORY_CARE_PROVIDER_SITE_OTHER): Payer: Medicare HMO | Admitting: Internal Medicine

## 2022-06-15 VITALS — BP 130/82 | HR 85 | Temp 98.0°F | Ht 61.0 in

## 2022-06-15 DIAGNOSIS — N184 Chronic kidney disease, stage 4 (severe): Secondary | ICD-10-CM | POA: Diagnosis not present

## 2022-06-15 DIAGNOSIS — I10 Essential (primary) hypertension: Secondary | ICD-10-CM | POA: Diagnosis not present

## 2022-06-15 DIAGNOSIS — Z0000 Encounter for general adult medical examination without abnormal findings: Secondary | ICD-10-CM | POA: Diagnosis not present

## 2022-06-15 DIAGNOSIS — K7460 Unspecified cirrhosis of liver: Secondary | ICD-10-CM | POA: Diagnosis not present

## 2022-06-15 DIAGNOSIS — I890 Lymphedema, not elsewhere classified: Secondary | ICD-10-CM | POA: Diagnosis not present

## 2022-06-15 MED ORDER — TRIAMCINOLONE ACETONIDE 0.1 % EX CREA: 1.0000 | TOPICAL_CREAM | Freq: Two times a day (BID) | CUTANEOUS | 3 refills | Status: DC

## 2022-06-15 MED ORDER — TRAMADOL HCL 50 MG PO TABS: 50.0000 mg | ORAL_TABLET | Freq: Four times a day (QID) | ORAL | 3 refills | Status: DC | PRN

## 2022-06-15 MED ORDER — RIFAXIMIN 550 MG PO TABS
550.0000 mg | ORAL_TABLET | Freq: Two times a day (BID) | ORAL | 3 refills | Status: DC
Start: 2022-06-15 — End: 2022-06-16

## 2022-06-15 MED ORDER — LACTULOSE 10 GM/15ML PO SOLN: ORAL | 3 refills | Status: DC

## 2022-06-15 MED ORDER — DICLOFENAC SODIUM 1 % EX GEL: CUTANEOUS | 5 refills | Status: DC

## 2022-06-15 NOTE — Progress Notes (Signed)
Subjective:  Patient ID: Katelyn Jackson, female    DOB: [birth year]  Age: 76 y.o. MRN: [id number]  CC: 3 month f/u (No concerns. )   HPI Katelyn Jackson presents for severe edema w/blistering rash F/u CRF, CHF, liver disease, pain. Here w/dtr in law  Outpatient Medications Prior to Visit  Medication Sig Dispense Refill   acetaminophen (TYLENOL) 325 MG tablet Take 2 tablets (650 mg total) by mouth every 6 (six) hours as needed for mild pain or moderate pain (or Fever >/= 101).     allopurinol (ZYLOPRIM) 100 MG tablet Take 0.5 tablets (50 mg total) by mouth daily. 90 tablet 1   apixaban (ELIQUIS) 5 MG TABS tablet Take 1 tablet (5 mg total) by mouth 2 (two) times daily. 180 tablet 3   bisacodyl (DULCOLAX) 10 MG suppository Place 10 mg rectally daily as needed for moderate constipation.     carvedilol (COREG) 6.25 MG tablet Take 1 tablet (6.25 mg total) by mouth 2 (two) times daily. 180 tablet 3   Cholecalciferol (VITAMIN D3) 50 MCG (2000 UT) capsule Take 1 capsule (2,000 Units total) by mouth daily. 100 capsule 3   Cyanocobalamin (VITAMIN B-12 PO) Take 2,000 mcg by mouth daily.     docusate sodium (COLACE) 100 MG capsule Take 1 capsule (100 mg total) by mouth 2 (two) times daily. 90 capsule 3   levothyroxine (SYNTHROID) 50 MCG tablet Take 1 tablet (50 mcg total) by mouth daily before breakfast. Overdue for Annual appt must see provider for future refills 90 tablet 3   lipase/protease/amylase (CREON) 12000-38000 units CPEP capsule Take 1 capsule (12,000 Units total) by mouth daily as needed (stomach problems). 270 capsule 3   magnesium oxide (MAG-OX) 400 MG tablet Take 1 tablet (400 mg total) by mouth 2 (two) times daily. 180 tablet 3   methocarbamol (ROBAXIN) 500 MG tablet Take 1 tablet (500 mg total) by mouth every 8 (eight) hours as needed for muscle spasms. 20 tablet 0   omeprazole (PRILOSEC) 40 MG capsule TAKE ONE CAPSULE BY MOUTH DAILY 90 capsule 3   OVER THE COUNTER MEDICATION Apply  1 application topically daily as needed (apply to buttocks  as needed for irritaiton). Intensive Skin Care Therapy     polyethylene glycol (MIRALAX / GLYCOLAX) 17 g packet Take 17 g by mouth 2 (two) times daily.     polyvinyl alcohol (LIQUIFILM TEARS) 1.4 % ophthalmic solution Place 1 drop into both eyes as needed for dry eyes.     potassium chloride (KLOR-CON) 8 MEQ tablet Take 1 tablet (8 mEq total) by mouth daily. 90 tablet 3   pravastatin (PRAVACHOL) 20 MG tablet Take 1 tablet (20 mg total) by mouth daily at 6 PM. 90 tablet 3   sennosides-docusate sodium (SENOKOT-S) 8.6-50 MG tablet Take 1 tablet by mouth daily. 90 tablet 3   simethicone (MYLICON) 40 WU/9.8JX drops Take 0.3 mLs (20 mg total) by mouth 4 (four) times daily as needed for flatulence.     tamoxifen (NOLVADEX) 20 MG tablet Take 1 tablet (20 mg total) by mouth daily. Will take after finishing Exemestane 90 tablet 3   torsemide (DEMADEX) 100 MG tablet Take 1 tablet (100 mg total) by mouth 2 (two) times daily. 180 tablet 3   diclofenac Sodium (VOLTAREN) 1 % GEL APPLY FOUR GRAMS FOUR TIMES DAILY AS NEEDED (Patient taking differently: Apply 4 g topically 4 (four) times daily as needed (pain).) 100 g 5   lactulose (CHRONULAC) 10 GM/15ML solution TAKE 15 MILLILITERS  BY MOUTH THREE TIMESDAILY 946 mL 0   rifaximin (XIFAXAN) 550 MG TABS tablet Take 1 tablet (550 mg total) by mouth 2 (two) times daily. 180 tablet 1   traMADol (ULTRAM) 50 MG tablet Take by mouth.     No facility-administered medications prior to visit.    ROS: Review of Systems  Constitutional:  Negative for activity change, appetite change, chills, fatigue and unexpected weight change.  HENT:  Negative for congestion, mouth sores and sinus pressure.   Eyes:  Negative for visual disturbance.  Respiratory:  Negative for cough and chest tightness.   Gastrointestinal:  Negative for abdominal pain and nausea.  Genitourinary:  Negative for difficulty urinating, frequency and  vaginal pain.  Musculoskeletal:  Positive for arthralgias, back pain and gait problem.  Skin:  Positive for rash. Negative for pallor.  Neurological:  Positive for weakness. Negative for dizziness, tremors, numbness and headaches.  Psychiatric/Behavioral:  Negative for confusion, sleep disturbance and suicidal ideas. The patient is not nervous/anxious.     Objective:  BP 130/82   Pulse 85   Temp 98 F (36.7 C) (Oral)   Ht '5\' 1"'$  (1.549 m)   LMP  (LMP Unknown)   SpO2 99%   BMI 39.49 kg/m   BP Readings from Last 3 Encounters:  06/15/22 130/82  06/15/22 130/82  03/10/22 133/60    Wt Readings from Last 3 Encounters:  02/24/22 209 lb (94.8 kg)  01/13/22 208 lb 2 oz (94.4 kg)  01/06/22 208 lb (94.3 kg)    Physical Exam Constitutional:      General: She is not in acute distress.    Appearance: She is well-developed. She is obese.  HENT:     Head: Normocephalic.     Right Ear: External ear normal.     Left Ear: External ear normal.     Nose: Nose normal.  Eyes:     General:        Right eye: No discharge.        Left eye: No discharge.     Conjunctiva/sclera: Conjunctivae normal.     Pupils: Pupils are equal, round, and reactive to light.  Neck:     Thyroid: No thyromegaly.     Vascular: No JVD.     Trachea: No tracheal deviation.  Cardiovascular:     Rate and Rhythm: Normal rate and regular rhythm.     Heart sounds: Normal heart sounds.  Pulmonary:     Effort: No respiratory distress.     Breath sounds: No stridor. No wheezing.  Abdominal:     General: Bowel sounds are normal. There is no distension.     Palpations: Abdomen is soft. There is no mass.     Tenderness: There is no abdominal tenderness. There is no guarding or rebound.  Musculoskeletal:        General: No tenderness.     Cervical back: Normal range of motion and neck supple. No rigidity.     Right lower leg: Edema present.     Left lower leg: Edema present.  Lymphadenopathy:     Cervical: No  cervical adenopathy.  Skin:    Findings: Rash present. No erythema.  Neurological:     Mental Status: She is oriented to person, place, and time.     Cranial Nerves: No cranial nerve deficit.     Motor: Weakness present. No abnormal muscle tone.     Coordination: Coordination normal.     Gait: Gait abnormal.  Deep Tendon Reflexes: Reflexes normal.  Psychiatric:        Behavior: Behavior normal.        Thought Content: Thought content normal.        Judgment: Judgment normal.   3+ edema Rash on lower legs - watery  vesicles In a w/c    A total time of 45 minutes was spent preparing to see the patient, reviewing tests, x-rays, operative reports and other medical records.  Also, obtaining history and performing comprehensive physical exam.  Additionally, counseling the patient regarding the above listed issues - liver disease, CRF, edema.   Finally, documenting clinical information in the health records, coordination of care, educating the patient. It is a complex case.   Lab Results  Component Value Date   WBC 2.7 (L) 02/24/2022   HGB 10.2 (L) 02/24/2022   HCT 31.2 (L) 02/24/2022   PLT 96.0 (L) 02/24/2022   GLUCOSE 100 (H) 02/24/2022   CHOL 163 11/25/2021   TRIG 66.0 11/25/2021   HDL 77.50 11/25/2021   LDLCALC 72 11/25/2021   ALT 15 02/24/2022   AST 21 02/24/2022   NA 140 02/24/2022   K 3.7 02/24/2022   CL 100 02/24/2022   CREATININE 1.55 (H) 02/24/2022   BUN 31 (H) 02/24/2022   CO2 32 02/24/2022   TSH 4.86 02/24/2022   INR 2.9 (H) 07/30/2021   HGBA1C 5.1 02/24/2022    MM 3D SCREEN BREAST BILATERAL  Result Date: 06/02/2022 CLINICAL DATA:  Screening. History of LEFT breast cancer status post lumpectomy and radiation therapy in 2018. History of CHF and current/associated edema to legs and body. EXAM: DIGITAL SCREENING BILATERAL MAMMOGRAM WITH TOMOSYNTHESIS AND CAD TECHNIQUE: Bilateral screening digital craniocaudal and mediolateral oblique mammograms were obtained.  Bilateral screening digital breast tomosynthesis was performed. The images were evaluated with computer-aided detection. COMPARISON:  Previous exam(s). ACR Breast Density Category b: There are scattered areas of fibroglandular density. FINDINGS: There are no findings suspicious for malignancy. IMPRESSION: No mammographic evidence of malignancy. A result letter of this screening mammogram will be mailed directly to the patient. RECOMMENDATION: Screening mammogram in one year. (Code:SM-B-01Y) BI-RADS CATEGORY  1: Negative. Electronically Signed   By: Franki Cabot M.D.   On: 06/02/2022 15:04    Assessment & Plan:   Problem List Items Addressed This Visit     CKD (chronic kidney disease) stage 4, GFR 15-29 ml/min (HCC)    F/u w/nephrilogy - Dr Johnney Ou      Essential hypertension (Chronic)    On Coreg, Torsemide      Liver cirrhosis (HCC)    Cont w/Xifaxan and Laculose      Lymphedema    Worse Re-start using a leg pump Cont w/Torsemide      Morbid obesity (HCC)    BMI 47  Fluid balance is difficult to manage.  There is some element of lymphedema.  The patient states she maintains on a low-salt diet.  We discussed her diet and her calorie intake is reasonable.  It is a difficult situation.         Meds ordered this encounter  Medications   triamcinolone cream (KENALOG) 0.1 %    Sig: Apply 1 Application topically 2 (two) times daily.    Dispense:  450 g    Refill:  3   diclofenac Sodium (VOLTAREN) 1 % GEL    Sig: APPLY FOUR GRAMS FOUR TIMES DAILY AS NEEDED    Dispense:  300 g    Refill:  5  traMADol (ULTRAM) 50 MG tablet    Sig: Take 1 tablet (50 mg total) by mouth every 6 (six) hours as needed.    Dispense:  120 tablet    Refill:  3   rifaximin (XIFAXAN) 550 MG TABS tablet    Sig: Take 1 tablet (550 mg total) by mouth 2 (two) times daily.    Dispense:  180 tablet    Refill:  3   lactulose (CHRONULAC) 10 GM/15ML solution    Sig: TAKE 15 MILLILITERS BY MOUTH THREE  TIMESDAILY    Dispense:  946 mL    Refill:  3      Follow-up: Return in about 3 months (around 09/15/2022) for a follow-up visit.  Walker Kehr, MD

## 2022-06-15 NOTE — Assessment & Plan Note (Signed)
BMI 47  Fluid balance is difficult to manage.  There is some element of lymphedema.  The patient states she maintains on a low-salt diet.  We discussed her diet and her calorie intake is reasonable.  It is a difficult situation.

## 2022-06-15 NOTE — Assessment & Plan Note (Signed)
Cont w/Xifaxan and Laculose 

## 2022-06-15 NOTE — Assessment & Plan Note (Addendum)
Worse Re-start using a leg pump Cont w/Torsemide

## 2022-06-15 NOTE — Progress Notes (Cosign Needed Addendum)
Subjective:   Katelyn Jackson is a 76 y.o. female who presents for Medicare Annual (Subsequent) preventive examination.  Review of Systems     Cardiac Risk Factors include: advanced age (>27mn, >>35women);dyslipidemia;family history of premature cardiovascular disease;hypertension;sedentary lifestyle     Objective:    Today's Vitals   06/15/22 0922  BP: 130/82  Pulse: 85  Temp: 98 F (36.7 C)  SpO2: 99%  Height: '5\' 1"'$  (1.549 m)  PainSc: 0-No pain   Body mass index is 39.49 kg/m.     06/15/2022    9:47 AM 10/12/2021    3:07 AM 07/02/2021   12:43 PM 07/01/2021   11:29 AM 06/28/2021   12:22 PM 05/11/2021    1:00 PM 03/17/2021    6:39 PM  Advanced Directives  Does Patient Have a Medical Advance Directive? No No No No No No No  Would patient like information on creating a medical advance directive? No - Patient declined No - Patient declined No - Patient declined   No - Patient declined No - Patient declined    Current Medications (verified) Outpatient Encounter Medications as of 06/15/2022  Medication Sig   acetaminophen (TYLENOL) 325 MG tablet Take 2 tablets (650 mg total) by mouth every 6 (six) hours as needed for mild pain or moderate pain (or Fever >/= 101).   allopurinol (ZYLOPRIM) 100 MG tablet Take 0.5 tablets (50 mg total) by mouth daily.   apixaban (ELIQUIS) 5 MG TABS tablet Take 1 tablet (5 mg total) by mouth 2 (two) times daily.   bisacodyl (DULCOLAX) 10 MG suppository Place 10 mg rectally daily as needed for moderate constipation.   carvedilol (COREG) 6.25 MG tablet Take 1 tablet (6.25 mg total) by mouth 2 (two) times daily.   Cholecalciferol (VITAMIN D3) 50 MCG (2000 UT) capsule Take 1 capsule (2,000 Units total) by mouth daily.   Cyanocobalamin (VITAMIN B-12 PO) Take 2,000 mcg by mouth daily.   docusate sodium (COLACE) 100 MG capsule Take 1 capsule (100 mg total) by mouth 2 (two) times daily.   levothyroxine (SYNTHROID) 50 MCG tablet Take 1 tablet (50 mcg total)  by mouth daily before breakfast. Overdue for Annual appt must see provider for future refills   lipase/protease/amylase (CREON) 12000-38000 units CPEP capsule Take 1 capsule (12,000 Units total) by mouth daily as needed (stomach problems).   magnesium oxide (MAG-OX) 400 MG tablet Take 1 tablet (400 mg total) by mouth 2 (two) times daily.   methocarbamol (ROBAXIN) 500 MG tablet Take 1 tablet (500 mg total) by mouth every 8 (eight) hours as needed for muscle spasms.   omeprazole (PRILOSEC) 40 MG capsule TAKE ONE CAPSULE BY MOUTH DAILY   OVER THE COUNTER MEDICATION Apply 1 application topically daily as needed (apply to buttocks  as needed for irritaiton). Intensive Skin Care Therapy   polyethylene glycol (MIRALAX / GLYCOLAX) 17 g packet Take 17 g by mouth 2 (two) times daily.   polyvinyl alcohol (LIQUIFILM TEARS) 1.4 % ophthalmic solution Place 1 drop into both eyes as needed for dry eyes.   potassium chloride (KLOR-CON) 8 MEQ tablet Take 1 tablet (8 mEq total) by mouth daily.   pravastatin (PRAVACHOL) 20 MG tablet Take 1 tablet (20 mg total) by mouth daily at 6 PM.   sennosides-docusate sodium (SENOKOT-S) 8.6-50 MG tablet Take 1 tablet by mouth daily.   simethicone (MYLICON) 40 MKX/3.8HWdrops Take 0.3 mLs (20 mg total) by mouth 4 (four) times daily as needed for flatulence.   tamoxifen (  NOLVADEX) 20 MG tablet Take 1 tablet (20 mg total) by mouth daily. Will take after finishing Exemestane   torsemide (DEMADEX) 100 MG tablet Take 1 tablet (100 mg total) by mouth 2 (two) times daily.   [DISCONTINUED] diclofenac Sodium (VOLTAREN) 1 % GEL APPLY FOUR GRAMS FOUR TIMES DAILY AS NEEDED (Patient taking differently: Apply 4 g topically 4 (four) times daily as needed (pain).)   [DISCONTINUED] lactulose (CHRONULAC) 10 GM/15ML solution TAKE 15 MILLILITERS BY MOUTH THREE TIMESDAILY   [DISCONTINUED] rifaximin (XIFAXAN) 550 MG TABS tablet Take 1 tablet (550 mg total) by mouth 2 (two) times daily.   [DISCONTINUED]  traMADol (ULTRAM) 50 MG tablet Take by mouth.   No facility-administered encounter medications on file as of 06/15/2022.    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 coli carrier 09/22/2021   GERD (gastroesophageal reflux disease)    Headache    Herpes labialis 08/19/2021   Hyperlipidemia    Hypertension    Hypothyroidism    Lymphedema    Moderate to severe pulmonary hypertension ([place])    Obesity    Osteoarthritis of right knee 08/19/2021   Personal history of radiation therapy    Pneumonia 12/2020   Presence of permanent cardiac pacemaker 01/21/2021   for bradycardia    Syncope 12/2020   needed a pacemaker   Past Surgical History:  Procedure Laterality Date   APPENDECTOMY     BREAST BIOPSY Left 2018   BREAST LUMPECTOMY Left 04/04/2017   x3   BREAST LUMPECTOMY WITH NEEDLE LOCALIZATION AND AXILLARY SENTINEL LYMPH NODE BX Left 04/04/2017   Procedure: BREAST LUMPECTOMY WITH NEEDLE LOCALIZATION x3 AND AXILLARY SENTINEL LYMPH NODE BX;  Surgeon: [name], MD;  Location: [place];  Service: General;  Laterality: Left;   CATARACT EXTRACTION W/ INTRAOCULAR LENS  IMPLANT, BILATERAL Bilateral 2018   COLONOSCOPY     EYE SURGERY     GLAUCOMA SURGERY Bilateral 2018   HARDWARE REMOVAL Right 07/26/2021   Procedure: RIGHT SHOULDER HARDWARE REMOVAL WITH WASHOUT;  Surgeon: Hiram Gash, MD;  Location: [facility];  Service: Orthopedics;  Laterality: Right;   INSERT / REPLACE / REMOVE PACEMAKER  01/21/2021   IR FLUORO GUIDE CV LINE LEFT  07/29/2021   IR REMOVAL TUN CV CATH W/O FL  09/07/2021   IR  US GUIDE BX ASP/DRAIN  07/16/2021   RE-EXCISION OF BREAST CANCER,SUPERIOR MARGINS Left 04/26/2017   Procedure: RE-EXCISION OF LEFT BREAST CANCER;  Surgeon: [name], MD;  Location: [place];  Service: General;  Laterality: Left;   SHOULDER HEMI-ARTHROPLASTY Right 03/17/2021   Procedure: SHOULDER HEMI-ARTHROPLASTY;  Surgeon: Hiram Gash, MD;  Location: [facility];  Service: Orthopedics;  Laterality: Right;   Family History  Problem Relation Age of Onset   CAD Mother 73   Stroke Mother 18       hemorr CVA   COPD Father 75   Cancer Neg Hx    Social History   Socioeconomic History   Marital status: Widowed    Spouse name: Not on  file   Number of children: 1   Years of education: Not on file   Highest education level: Not on file  Occupational History   Not on file  Tobacco Use   Smoking status: Never   Smokeless tobacco: Never  Vaping Use   Vaping Use: Never used  Substance and Sexual Activity   Alcohol use: No    Alcohol/week: 0.0 standard drinks of alcohol   Drug use: No   Sexual activity: Not on file  Other Topics Concern   Not on file  Social History Narrative   Not on file   Social Determinants of Health   Financial Resource Strain: Low Risk  (06/15/2022)   Overall Financial Resource Strain (CARDIA)    Difficulty of Paying Living Expenses: Not hard at all  Food Insecurity: No Food Insecurity (06/15/2022)   Hunger Vital Sign    Worried About Running Out of Food in the Last Year: Never true    Ran Out of Food in the Last Year: Never true  Transportation Needs: No Transportation Needs (06/15/2022)   PRAPARE - Hydrologist (Medical): No    Lack of Transportation (Non-Medical): No  Physical Activity: Inactive (06/15/2022)   Exercise Vital Sign    Days of Exercise per Week: 0 days    Minutes of Exercise per Session: 0 min  Stress: No Stress Concern Present (06/15/2022)   [place]     Feeling of Stress : Not at all  Social Connections: Socially Isolated (06/15/2022)   Social Connection and Isolation Panel [NHANES]    Frequency of Communication with Friends and Family: More than three times a week    Frequency of Social Gatherings with Friends and Family: Once a week    Attends Religious Services: Never    Marine scientist or Organizations: No    Attends Archivist Meetings: Never    Marital Status: Widowed    Tobacco Counseling Counseling given: Not Answered   Clinical Intake:  Pre-visit preparation completed: Yes  Pain : No/denies pain Pain Score: 0-No pain     BMI - recorded:  (Patient not able to stand) Nutritional Risks: None Diabetes: No  How often do you need to have someone help you when you read instructions, pamphlets, or other written materials from your doctor or pharmacy?: 1 - Never  Diabetic? no  Interpreter Needed?: No  Information entered by :: Lisette Abu, LPN.   Activities of Daily Living    06/15/2022    9:48 AM 07/02/2021   12:48 PM  In your present state of health, do you have any difficulty performing the following activities:  Hearing? 0   Vision? 0   Difficulty concentrating or making decisions? 0   Walking or climbing stairs? 1   Dressing or bathing? 1   Doing errands, shopping? 1 1  Preparing Food and eating ? Y   Using the Toilet? N   In the past six months, have you accidently leaked urine? N   Do you have problems with loss of bowel control? N   Managing your Medications? N   Managing your Finances? N   Housekeeping or managing your Housekeeping? Y     Patient Care Team: Plotnikov, [name], MD as PCP - General (Internal Medicine) [name], MD as Consulting Physician (General Surgery) Truitt Merle, MD as Consulting Physician (Hematology) Kyung Rudd, MD as Consulting Physician (Radiation Oncology) Estanislado Emms, MD (  Inactive) as Consulting Physician (Nephrology) Lorretta Harp, MD  as Consulting Physician (Cardiology) Verner Chol, MD as Consulting Physician (Sports Medicine) Delice Bison, Charlestine Massed, NP as Nurse Practitioner (Hematology and Oncology) [place], P.A. as Consulting Physician (Ophthalmology) Szabat, Darnelle Maffucci, [facility] (Inactive) as Pharmacist (Pharmacist) Antionette Char as Physician Assistant (Radiology) Ronnette Juniper, MD as Consulting Physician (Gastroenterology)  Indicate any recent Medical Services you may have received from other than [facility] providers in the past year (date may be approximate).     Assessment:   This is a routine wellness examination for Katelyn Jackson.  Hearing/Vision screen Hearing Screening - Comments:: Patient denied any hearing difficulty.   No hearing aids.  Vision Screening - Comments:: Patient does not wear any corrective lenses/contacts.   Eye exam done by: [facility]   Dietary issues and exercise activities discussed: Current Exercise Habits: The patient does not participate in regular exercise at present, Exercise limited by: orthopedic condition(s);neurologic condition(s);cardiac condition(s)   Goals Addressed   None   Depression Screen    06/15/2022    8:55 AM 09/22/2021    9:37 AM 05/11/2021   12:58 PM 01/08/2019    9:35 AM 09/05/2017    8:35 AM 05/17/2017    2:40 PM  PHQ 2/9 Scores  PHQ - 2 Score 0 0 0 0 0 0  PHQ- 9 Score 0         Fall Risk    06/15/2022    8:55 AM 09/22/2021    9:37 AM 05/11/2021    1:01 PM 05/11/2021    8:44 AM 01/08/2019    9:35 AM  Fall Risk   Falls in the past year? 0 '1 1 1 '$ 0  Number falls in past yr: 0 '1 1 1   '$ Injury with Fall? 0 '1 1 1   '$ Risk for fall due to : History of fall(s) History of fall(s);Impaired balance/gait;Impaired mobility History of fall(s);Impaired balance/gait;Impaired mobility Impaired balance/gait;Impaired mobility;History of fall(s) Impaired balance/gait;Impaired mobility  Follow up Falls evaluation completed Falls evaluation completed  Falls evaluation completed  Falls prevention discussed    FALL RISK PREVENTION PERTAINING TO THE HOME:  Any stairs in or around the home? Yes  If so, are there any without handrails? No  Home free of loose throw rugs in walkways, pet beds, electrical cords, etc? Yes  Adequate lighting in your home to reduce risk of falls? Yes   ASSISTIVE DEVICES UTILIZED TO PREVENT FALLS:  Life alert? No  Use of a cane, walker or w/c? Yes  Grab bars in the bathroom? Yes  Shower chair or bench in shower? No  Elevated toilet seat or a handicapped toilet? No   TIMED UP AND GO:  Was the test performed? No .  Length of time to ambulate 10 feet: n/a sec.   Appearance of gait: Gait not evaluated during this visit. (Patient not able to stand; confined to wheelchair)  Cognitive Function:        06/15/2022    9:49 AM  6CIT Screen  What Year? 0 points  What month? 0 points  What time? 0 points  Count back from 20 0 points  Months in reverse 0 points  Repeat phrase 0 points  Total Score 0 points    Immunizations Immunization History  Administered Date(s) Administered   Influenza, High Dose Seasonal PF 07/11/2017, 08/06/2018   Pneumococcal Conjugate-13 04/23/2018    TDAP status; patient declined  Flu Vaccine status: Due, Education has been provided regarding  the importance of this vaccine. Advised may receive this vaccine at local pharmacy or Health Dept. Aware to provide a copy of the vaccination record if obtained from local pharmacy or Health Dept. Verbalized acceptance and understanding.  Pneumococcal vaccine status: Declined,  Education has been provided regarding the importance of this vaccine but patient still declined. Advised may receive this vaccine at local pharmacy or Health Dept. Aware to provide a copy of the vaccination record if obtained from local pharmacy or Health Dept. Verbalized acceptance and understanding.   Covid-19 vaccine status: Declined, Education has been provided  regarding the importance of this vaccine but patient still declined. Advised may receive this vaccine at local pharmacy or Health Dept.or vaccine clinic. Aware to provide a copy of the vaccination record if obtained from local pharmacy or Health Dept. Verbalized acceptance and understanding.  Qualifies for Shingles Vaccine? Yes   Zostavax completed No   Shingrix Completed?: No.    Education has been provided regarding the importance of this vaccine. Patient has been advised to call insurance company to determine out of pocket expense if they have not yet received this vaccine. Advised may also receive vaccine at local pharmacy or Health Dept. Verbalized acceptance and understanding.  Screening Tests Health Maintenance  Topic Date Due   COVID-19 Vaccine (1) Never done   TETANUS/TDAP  Never done   Zoster Vaccines- Shingrix (1 of 2) Never done   Pneumonia Vaccine 25+ Years old (2 - PPSV23 or PCV20) 06/18/2018   INFLUENZA VACCINE  02/05/2023 (Originally 06/07/2022)   DEXA SCAN  Completed   Hepatitis C Screening  Completed   HPV VACCINES  Aged Out   COLONOSCOPY (Pts 45-46yr Insurance coverage will need to be confirmed)  Discontinued    Health Maintenance  Health Maintenance Due  Topic Date Due   COVID-19 Vaccine (1) Never done   TETANUS/TDAP  Never done   Zoster Vaccines- Shingrix (1 of 2) Never done   Pneumonia Vaccine 76 Years old (2 - PPSV23 or PCV20) 06/18/2018    Colorectal cancer screening: No longer required.   Mammogram status: Completed 06/01/2021. Repeat every year  Bone Density status: Completed 08/18/2020. Results reflect: Bone density results: OSTEOPOROSIS. Repeat every 2 years.  Lung Cancer Screening: (Low Dose CT Chest recommended if Age 76-80years, 30 pack-year currently smoking OR have quit w/in 15years.) does not qualify.   Lung Cancer Screening Referral: no  Additional Screening:  Hepatitis C Screening: does qualify; Completed 07/22/2021  Vision Screening:  Recommended annual ophthalmology exams for early detection of glaucoma and other disorders of the eye. Is the patient up to date with their annual eye exam?  Yes  Who is the provider or what is the name of the office in which the patient attends annual eye exams[place] is not established with a provider, would they like to be referred to a provider to establish care? No .   Dental Screening: Recommended annual dental exams for proper oral hygiene  Community Resource Referral / Chronic Care Management: CRR required this visit?  No   CCM required this visit?  No      Plan:     I have personally reviewed and noted the following in the patient's chart:   Medical and social history Use of alcohol, tobacco or illicit drugs  Current medications and supplements including opioid prescriptions.  Functional ability and status Nutritional status Physical activity Advanced directives List of other physicians Hospitalizations, surgeries, and ER visits in  previous 12 months Vitals Screenings to include cognitive, depression, and falls Referrals and appointments  In addition, I have reviewed and discussed with patient certain preventive protocols, quality metrics, and best practice recommendations. A written personalized care plan for preventive services as well as general preventive health recommendations were provided to patient.     Sheral Flow, LPN   [birth year]   Nurse Notes:  Hearing Screening - Comments:: Patient denied any hearing difficulty.   No hearing aids.  Vision Screening - Comments:: Patient does not wear any corrective lenses/contacts.   Eye exam done by: [place] screening examination/treatment/procedure(s) were performed by non-physician practitioner and as supervising physician I was immediately available for consultation/collaboration.  I agree with above. Lew Dawes, MD

## 2022-06-15 NOTE — Assessment & Plan Note (Signed)
On Coreg, Torsemide 

## 2022-06-15 NOTE — Telephone Encounter (Signed)
Pt was on cover-my-meds.. need PA for Xiffaxian. Submitted w/ (Key: [id number]). Rec'd msg Your information has been sent to [facility]...[name]

## 2022-06-15 NOTE — Assessment & Plan Note (Signed)
F/u w/nephrilogy - Dr Johnney Ou

## 2022-06-15 NOTE — Patient Instructions (Signed)
Katelyn Jackson , Thank you for taking time to come for your Medicare Wellness Visit. I appreciate your ongoing commitment to your health goals. Please review the following plan we discussed and let me know if I can assist you in the future.   Screening recommendations/referrals: Colonoscopy: Discontinued Mammogram: 06/01/2022; due every year Bone Density: 08/18/2020; due every 2 years Recommended yearly ophthalmology/optometry visit for glaucoma screening and checkup Recommended yearly dental visit for hygiene and checkup  Vaccinations: Influenza vaccine: due Fall 2023 Pneumococcal vaccine: 04/23/2018 Tdap vaccine: declined Shingles vaccine: declined   Covid-19: declined  Advanced directives: No  Conditions/risks identified: Yes  Next appointment: Please schedule your next Medicare Wellness Visit with your Nurse Health Advisor in 1 year by calling [phone].   Preventive Care 76 Years and Older, Female Preventive care refers to lifestyle choices and visits with your health care provider that can promote health and wellness. What does preventive care include? A yearly physical exam. This is also called an annual well check. Dental exams once or twice a year. Routine eye exams. Ask your health care provider how often you should have your eyes checked. Personal lifestyle choices, including: Daily care of your teeth and gums. Regular physical activity. Eating a healthy diet. Avoiding tobacco and drug use. Limiting alcohol use. Practicing safe sex. Taking low-dose aspirin every day. Taking vitamin and mineral supplements as recommended by your health care provider. What happens during an annual well check? The services and screenings done by your health care provider during your annual well check will depend on your age, overall health, lifestyle risk factors, and family history of disease. Counseling  Your health care provider may ask you questions about your: Alcohol  use. Tobacco use. Drug use. Emotional well-being. Home and relationship well-being. Sexual activity. Eating habits. History of falls. Memory and ability to understand (cognition). Work and work Statistician. Reproductive health. Screening  You may have the following tests or measurements: Height, weight, and BMI. Blood pressure. Lipid and cholesterol levels. These may be checked every 5 years, or more frequently if you are over 31 years old. Skin check. Lung cancer screening. You may have this screening every year starting at age 30 if you have a 30-pack-year history of smoking and currently smoke or have quit within the past 15 years. Fecal occult blood test (FOBT) of the stool. You may have this test every year starting at age 61. Flexible sigmoidoscopy or colonoscopy. You may have a sigmoidoscopy every 5 years or a colonoscopy every 10 years starting at age 59. Hepatitis C blood test. Hepatitis B blood test. Sexually transmitted disease (STD) testing. Diabetes screening. This is done by checking your blood sugar (glucose) after you have not eaten for a while (fasting). You may have this done every 1-3 years. Bone density scan. This is done to screen for osteoporosis. You may have this done starting at age 81. Mammogram. This may be done every 1-2 years. Talk to your health care provider about how often you should have regular mammograms. Talk with your health care provider about your test results, treatment options, and if necessary, the need for more tests. Vaccines  Your health care provider may recommend certain vaccines, such as: Influenza vaccine. This is recommended every year. Tetanus, diphtheria, and acellular pertussis (Tdap, Td) vaccine. You may need a Td booster every 10 years. Zoster vaccine. You may need this after age 55. Pneumococcal 13-valent conjugate (PCV13) vaccine. One dose is recommended after age 65. Pneumococcal polysaccharide (PPSV23) vaccine. One dose  is  recommended after age 63. Talk to your health care provider about which screenings and vaccines you need and how often you need them. This information is not intended to replace advice given to you by your health care provider. Make sure you discuss any questions you have with your health care provider. Document Released: 11/20/2015 Document Revised: 07/13/2016 Document Reviewed: 08/25/2015 Elsevier Interactive Patient Education  2017 [place] Prevention in the Home Falls can cause injuries. They can happen to people of all ages. There are many things you can do to make your home safe and to help prevent falls. What can I do on the outside of my home? Regularly fix the edges of walkways and driveways and fix any cracks. Remove anything that might make you trip as you walk through a door, such as a raised step or threshold. Trim any bushes or trees on the path to your home. Use bright outdoor lighting. Clear any walking paths of anything that might make someone trip, such as rocks or tools. Regularly check to see if handrails are loose or broken. Make sure that both sides of any steps have handrails. Any raised decks and porches should have guardrails on the edges. Have any leaves, snow, or ice cleared regularly. Use sand or salt on walking paths during winter. Clean up any spills in your garage right away. This includes oil or grease spills. What can I do in the bathroom? Use night lights. Install grab bars by the toilet and in the tub and shower. Do not use towel bars as grab bars. Use non-skid mats or decals in the tub or shower. If you need to sit down in the shower, use a plastic, non-slip stool. Keep the floor dry. Clean up any water that spills on the floor as soon as it happens. Remove soap buildup in the tub or shower regularly. Attach bath mats securely with double-sided non-slip rug tape. Do not have throw rugs and other things on the floor that can make you  trip. What can I do in the bedroom? Use night lights. Make sure that you have a light by your bed that is easy to reach. Do not use any sheets or blankets that are too big for your bed. They should not hang down onto the floor. Have a firm chair that has side arms. You can use this for support while you get dressed. Do not have throw rugs and other things on the floor that can make you trip. What can I do in the kitchen? Clean up any spills right away. Avoid walking on wet floors. Keep items that you use a lot in easy-to-reach places. If you need to reach something above you, use a strong step stool that has a grab bar. Keep electrical cords out of the way. Do not use floor polish or wax that makes floors slippery. If you must use wax, use non-skid floor wax. Do not have throw rugs and other things on the floor that can make you trip. What can I do with my stairs? Do not leave any items on the stairs. Make sure that there are handrails on both sides of the stairs and use them. Fix handrails that are broken or loose. Make sure that handrails are as long as the stairways. Check any carpeting to make sure that it is firmly attached to the stairs. Fix any carpet that is loose or worn. Avoid having throw rugs at the top or bottom of the stairs.  If you do have throw rugs, attach them to the floor with carpet tape. Make sure that you have a light switch at the top of the stairs and the bottom of the stairs. If you do not have them, ask someone to add them for you. What else can I do to help prevent falls? Wear shoes that: Do not have high heels. Have rubber bottoms. Are comfortable and fit you well. Are closed at the toe. Do not wear sandals. If you use a stepladder: Make sure that it is fully opened. Do not climb a closed stepladder. Make sure that both sides of the stepladder are locked into place. Ask someone to hold it for you, if possible. Clearly mark and make sure that you can  see: Any grab bars or handrails. First and last steps. Where the edge of each step is. Use tools that help you move around (mobility aids) if they are needed. These include: Canes. Walkers. Scooters. Crutches. Turn on the lights when you go into a dark area. Replace any light bulbs as soon as they burn out. Set up your furniture so you have a clear path. Avoid moving your furniture around. If any of your floors are uneven, fix them. If there are any pets around you, be aware of where they are. Review your medicines with your doctor. Some medicines can make you feel dizzy. This can increase your chance of falling. Ask your doctor what other things that you can do to help prevent falls. This information is not intended to replace advice given to you by your health care provider. Make sure you discuss any questions you have with your health care provider. Document Released: 08/20/2009 Document Revised: 03/31/2016 Document Reviewed: 11/28/2014 Elsevier Interactive Patient Education  2017 [employer].

## 2022-06-16 MED ORDER — RIFAXIMIN 550 MG PO TABS: 550.0000 mg | ORAL_TABLET | Freq: Two times a day (BID) | ORAL | 11 refills | Status: DC

## 2022-06-16 NOTE — Telephone Encounter (Signed)
Rec'd determination med request has received an Unfavorable outcome for #[id number]. [place] APPR[place]ED for/through 11/06/2022 for up to a 30-day supply by [employer] X[employer]an Coverage Policy. If ok with 30 at a time will need knew rx.Marland KitchenJohny Chess

## 2022-06-22 DIAGNOSIS — I5032 Chronic diastolic (congestive) heart failure: Secondary | ICD-10-CM | POA: Diagnosis not present

## 2022-06-22 DIAGNOSIS — I129 Hypertensive chronic kidney disease with stage 1 through stage 4 chronic kidney disease, or unspecified chronic kidney disease: Secondary | ICD-10-CM | POA: Diagnosis not present

## 2022-06-22 DIAGNOSIS — I272 Pulmonary hypertension, unspecified: Secondary | ICD-10-CM | POA: Diagnosis not present

## 2022-06-22 DIAGNOSIS — Z950 Presence of cardiac pacemaker: Secondary | ICD-10-CM | POA: Diagnosis not present

## 2022-06-22 DIAGNOSIS — N184 Chronic kidney disease, stage 4 (severe): Secondary | ICD-10-CM | POA: Diagnosis not present

## 2022-06-22 DIAGNOSIS — Z6841 Body Mass Index (BMI) 40.0 and over, adult: Secondary | ICD-10-CM | POA: Diagnosis not present

## 2022-06-29 DIAGNOSIS — N184 Chronic kidney disease, stage 4 (severe): Secondary | ICD-10-CM | POA: Diagnosis not present

## 2022-06-29 DIAGNOSIS — I129 Hypertensive chronic kidney disease with stage 1 through stage 4 chronic kidney disease, or unspecified chronic kidney disease: Secondary | ICD-10-CM | POA: Diagnosis not present

## 2022-06-29 DIAGNOSIS — R600 Localized edema: Secondary | ICD-10-CM | POA: Diagnosis not present

## 2022-06-29 DIAGNOSIS — M109 Gout, unspecified: Secondary | ICD-10-CM | POA: Diagnosis not present

## 2022-06-29 DIAGNOSIS — D649 Anemia, unspecified: Secondary | ICD-10-CM | POA: Diagnosis not present

## 2022-06-29 DIAGNOSIS — Z6841 Body Mass Index (BMI) 40.0 and over, adult: Secondary | ICD-10-CM | POA: Diagnosis not present

## 2022-06-30 DIAGNOSIS — N390 Urinary tract infection, site not specified: Secondary | ICD-10-CM | POA: Diagnosis not present

## 2022-06-30 DIAGNOSIS — R600 Localized edema: Secondary | ICD-10-CM | POA: Diagnosis not present

## 2022-07-04 ENCOUNTER — Other Ambulatory Visit: Payer: Self-pay

## 2022-07-04 DIAGNOSIS — K7460 Unspecified cirrhosis of liver: Secondary | ICD-10-CM | POA: Diagnosis not present

## 2022-07-06 NOTE — Progress Notes (Unsigned)
[place]   [phone] [phone]   Clinic Follow up Note   Patient Care Team: Plotnikov, [name], MD as PCP - General (Internal Medicine) [name], MD as Consulting Physician (General Surgery) Truitt Merle, MD as Consulting Physician (Hematology) Kyung Rudd, MD as Consulting Physician (Radiation Oncology) Estanislado Emms, MD (Inactive) as Consulting Physician (Nephrology) Lorretta Harp, MD as Consulting Physician (Cardiology) Verner Chol, MD as Consulting Physician (Sports Medicine) Delice Bison, Charlestine Massed, Katelyn Jackson as Nurse Practitioner (Hematology and Oncology) [facility] as Consulting Physician (Ophthalmology) Szabat, Darnelle Maffucci, [facility] (Inactive) as Pharmacist (Pharmacist) Antionette Char as Physician Assistant (Radiology) Ronnette Juniper, MD as Consulting Physician (Gastroenterology) Justin Mend, MD as Attending Physician (Nephrology) 07/07/2022  CHIEF COMPLAINT: Follow up left breast cancer, pancytopenia   SUMMARY OF ONCOLOGIC HISTORY: Oncology History Overview Note  Cancer Staging Malignant neoplasm of upper-outer quadrant of left breast in female, estrogen receptor positive ([place]) Staging form: Breast, AJCC 8th Edition - Clinical stage from 02/06/2017: Stage IA (cT1b(m), cN0, cM0, G1, ER: Positive, PR: Positive, HER2: Negative) - Signed by Truitt Merle, MD on 02/14/2017 - Pathologic stage from 04/04/2017: Stage IA (pT1b(m), pN0, cM0, G2, ER: Positive, PR: Positive, HER2: Negative, Oncotype DX score: 18) - Signed by Truitt Merle, MD on 06/30/2017     Malignant neoplasm of upper-outer quadrant of left breast in female, estrogen receptor positive ([place])  01/26/2017 Mammogram   Screening mammogram on 01/26/17 showed possible asymmetries in the left breast.    02/02/2017 Mammogram   Diagnostic mammogram and [place]: 1. An irregular hypoechoic mass in the left breast at 1 o'clock 7 cm from the nipple measuring 6 x 3 x 4 mm.   2. There is an irregular hypoechoic mass in left breast at 3 o'clock 7 cm from the nipple measuring 4 x 4 x 3 mm.  3. There is a subtle hypoechoic mass in the left breast at 2:30 5 cm from the nipple measuring 3 x 3 x 3 mm 4. There are multiple other small cysts in the left breast  5. Left axilla (-) on [place]    02/06/2017 Initial Biopsy   Diagnosis 1. Breast, left, needle core biopsy, 1:00 o'clock, 7 CMFN - INVASIVE DUCTAL CARCINOMA, SEE COMMENT. - DUCTAL CARCINOMA IN SITU. 2. Breast, left, needle core biopsy, 3:00 o'clock, 7 CMFN - INVASIVE DUCTAL CARCINOMA, SEE COMMENT. - DUCTAL CARCINOMA IN SITU. Microscopic Comment 1. The carcinoma in parts #1 and 2 appear morphologically similar and grade 1.   02/06/2017 Receptors her2   Both biopsy ER 100%+, PR 100%+, HER2-, Ki67 10%   02/06/2017 Initial Diagnosis   Malignant neoplasm of upper-outer quadrant of left breast in female, estrogen receptor positive ([place])   04/04/2017 Surgery   Left breast lumpectomy and SLN biopsy    04/04/2017 Pathology Results   Diagnosis 1. Breast, lumpectomy, Left - MULTIFOCAL INVASIVE DUCTAL CARCINOMA, [place]TINGHAM GRADE 2 OF 3, 0.9 CM - DUCTAL CARCINOMA IN SITU, LOW GRADE (<1MM FROM MEDIAL MARGIN) - FIBROCYSTIC AND COLUMNAR CELL CHANGE - CALCIFICATIONS ASSOCIATED WITH CARCINOMA AND BENIGN DISEASE - MARGINS UNINVOLVED BY CARCINOMA - PREVIOUS BIOPSY SITE CHANGES - SEE ONCOLOGY TABLE BELOW 2. Lymph node, sentinel, biopsy, Left Axillary #1 - NO CARCINOMA IDENTIFIED IN ONE LYMPH NODE (0/1) 3. Lymph node, sentinel, biopsy, Left Axillary #2 - NO CARCINOMA IDENTIFIED IN ONE LYMPH NODE (0/1)   04/04/2017 Oncotype testing   RS 18, which predicts 10-year distant recurrence risk of 12% with tamoxifen  04/26/2017 Surgery   Re-excision for close margin, final pathyology was negative for cancer    06/06/2017 - 07/19/2017 Radiation Therapy   The Left breast was treated to 42.5 Gy in 17 fractions at 2.5 Gy per  fraction. 2. The Left breast was boosted to 7.5 Gy in 3 fractions at 2.5 Gy per fraction.      06/18/2017 - 06/23/2017 Hospital Admission   Admit date: 06/18/17 Admission diagnosis: Intractable lower back pain- sciatica  Additional comments: Sent home with course of steroids and referral for f/u for back injections. Did have AKI w/ CR at 2/0, up from her baseline of 1.6. This slowed with IVF. Hypokalemia improved with K replacement. Radiotherapy was stopped at that time as she was unable to lay on her back due to pain.    08/2017 -  Anti-estrogen oral therapy   Exemestane 25 mg dialy starting 08/2017     08/28/2017 Imaging   BONE DENSITY SCAN  ASSESSMENT: The BMD measured at Femur Neck Left is 0.848 g/cm2 with a T-score of -1.4.   DualFemur Neck Left 08/28/2017    71.7         -1.4    0.848 g/cm2  AP Spine  L1-L3     08/28/2017    71.7         -1.1    1.052 g/cm2   01/29/2018 Mammogram   IMPRESSION: 1. No mammographic evidence of breast malignancy. 2. Surgical and radiation changes within the LEFT breast.     CURRENT THERAPY: Antiestrogen therapy, started 08/2017, currently on Tamoxifen since 09/2021 due to osteoporosis; on hold 07/07/22 for pancytopenia   INTERVAL HISTORY: Katelyn Jackson returns for follow up. Last seen by Dr. Burr Medico 03/10/22. Mammogram 06/01/22 was negative.  She is actually not sure why she is here so soon.  She denies new breast concerns or any changes.  She does PT exercises for lymphedema.  She continues tamoxifen which she is tolerating well.  She bruises more after IV sticks, but no other bleeding.  Denies fever, night sweats, fatigue, dyspnea.  Unintentional weight loss, or any other new specific complaints.  All other systems were reviewed with the patient and are negative.  MEDICAL HISTORY:  Past Medical History:  Diagnosis Date   Arthritis    Breast lesion 12/07/2021   Cancer [facility])    breast cancer   Chronic atrial fibrillation (HCC)    Chronic kidney  disease    sees Dr Florene Glen   Colonization with VRE (vancomycin-resistant enterococcus) 09/22/2021   Cramps, muscle, general    Dry skin 09/22/2021   Dyspnea on exertion    Dysrhythmia    a-fib,    ESBL E. coli carrier 09/22/2021   GERD (gastroesophageal reflux disease)    Headache    Herpes labialis 08/19/2021   Hyperlipidemia    Hypertension    Hypothyroidism    Lymphedema    Moderate to severe pulmonary hypertension ([place])    Obesity    Osteoarthritis of right knee 08/19/2021   Personal history of radiation therapy    Pneumonia 12/2020   Presence of permanent cardiac pacemaker 01/21/2021   for bradycardia    Syncope 12/2020   needed a pacemaker    SURGICAL HISTORY: Past Surgical History:  Procedure Laterality Date   APPENDECTOMY     BREAST BIOPSY Left 2018   BREAST LUMPECTOMY Left 04/04/2017   x3   BREAST LUMPECTOMY WITH NEEDLE LOCALIZATION AND AXILLARY SENTINEL LYMPH NODE BX Left 04/04/2017   Procedure: BREAST  LUMPECTOMY WITH NEEDLE LOCALIZATION x3 AND AXILLARY SENTINEL LYMPH NODE BX;  Surgeon: [name], MD;  Location: [place];  Service: General;  Laterality: Left;   CATARACT EXTRACTION W/ INTRAOCULAR LENS  IMPLANT, BILATERAL Bilateral 2018   COLONOSCOPY     EYE SURGERY     GLAUCOMA SURGERY Bilateral 2018   HARDWARE REMOVAL Right 07/26/2021   Procedure: RIGHT SHOULDER HARDWARE REMOVAL WITH WASHOUT;  Surgeon: Hiram Gash, MD;  Location: [facility];  Service: Orthopedics;  Laterality: Right;   INSERT / REPLACE / REMOVE PACEMAKER  01/21/2021   IR FLUORO GUIDE CV LINE LEFT  07/29/2021   IR REMOVAL TUN CV CATH W/O FL  09/07/2021   IR US GUIDE BX ASP/DRAIN  07/16/2021   RE-EXCISION OF BREAST CANCER,SUPERIOR MARGINS Left 04/26/2017   Procedure: RE-EXCISION OF LEFT BREAST CANCER;  Surgeon: [name], MD;  Location: [place];  Service: General;  Laterality: Left;   SHOULDER HEMI-ARTHROPLASTY Right 03/17/2021   Procedure: SHOULDER HEMI-ARTHROPLASTY;  Surgeon: Hiram Gash, MD;   Location: [facility];  Service: Orthopedics;  Laterality: Right;    I have reviewed the social history and family history with the patient and they are unchanged from previous note.  ALLERGIES:  is allergic to chlorhexidine gluconate and penicillins.  MEDICATIONS:  Current Outpatient Medications  Medication Sig Dispense Refill   acetaminophen (TYLENOL) 325 MG tablet Take 2 tablets (650 mg total) by mouth every 6 (six) hours as needed for mild pain or moderate pain (or Fever >/= 101).     allopurinol (ZYLOPRIM) 100 MG tablet Take 0.5 tablets (50 mg total) by mouth daily. 90 tablet 1   apixaban (ELIQUIS) 5 MG TABS tablet Take 1 tablet (5 mg total) by mouth 2 (two) times daily. 180 tablet 3   bisacodyl (DULCOLAX) 10 MG suppository Place 10 mg rectally daily as needed for moderate constipation.     carvedilol (COREG) 6.25 MG tablet Take 1 tablet (6.25 mg total) by mouth 2 (two) times daily. 180 tablet 3   Cholecalciferol (VITAMIN D3) 50 MCG (2000 UT) capsule Take 1 capsule (2,000 Units total) by mouth daily. 100 capsule 3   Cyanocobalamin (VITAMIN B-12 PO) Take 2,000 mcg by mouth daily.     diclofenac Sodium (VOLTAREN) 1 % GEL APPLY FOUR GRAMS FOUR TIMES DAILY AS NEEDED 300 g 5   docusate sodium (COLACE) 100 MG capsule Take 1 capsule (100 mg total) by mouth 2 (two) times daily. 90 capsule 3   lactulose (CHRONULAC) 10 GM/15ML solution TAKE 15 MILLILITERS BY MOUTH THREE TIMESDAILY 946 mL 3   levothyroxine (SYNTHROID) 50 MCG tablet Take 1 tablet (50 mcg total) by mouth daily before breakfast. Overdue for Annual appt must see provider for future refills 90 tablet 3   lipase/protease/amylase (CREON) 12000-38000 units CPEP capsule Take 1 capsule (12,000 Units total) by mouth daily as needed (stomach problems). 270 capsule 3   magnesium oxide (MAG-OX) 400 MG tablet Take 1 tablet (400 mg total) by mouth 2 (two) times daily. 180 tablet 3   methocarbamol (ROBAXIN) 500 MG tablet Take 1 tablet (500 mg total) by  mouth every 8 (eight) hours as needed for muscle spasms. 20 tablet 0   omeprazole (PRILOSEC) 40 MG capsule TAKE ONE CAPSULE BY MOUTH DAILY 90 capsule 3   OVER THE COUNTER MEDICATION Apply 1 application topically daily as needed (apply to buttocks  as needed for irritaiton). Intensive Skin Care Therapy     polyethylene glycol (MIRALAX / GLYCOLAX) 17 g packet Take  17 g by mouth 2 (two) times daily.     polyvinyl alcohol (LIQUIFILM TEARS) 1.4 % ophthalmic solution Place 1 drop into both eyes as needed for dry eyes.     potassium chloride (KLOR-CON) 8 MEQ tablet Take 1 tablet (8 mEq total) by mouth daily. 90 tablet 3   pravastatin (PRAVACHOL) 20 MG tablet Take 1 tablet (20 mg total) by mouth daily at 6 PM. 90 tablet 3   rifaximin (XIFAXAN) 550 MG TABS tablet Take 1 tablet (550 mg total) by mouth 2 (two) times daily. 60 tablet 11   sennosides-docusate sodium (SENOKOT-S) 8.6-50 MG tablet Take 1 tablet by mouth daily. 90 tablet 3   simethicone (MYLICON) 40 NT/7.0YF drops Take 0.3 mLs (20 mg total) by mouth 4 (four) times daily as needed for flatulence.     tamoxifen (NOLVADEX) 20 MG tablet Take 1 tablet (20 mg total) by mouth daily. Will take after finishing Exemestane 90 tablet 3   torsemide (DEMADEX) 100 MG tablet Take 1 tablet (100 mg total) by mouth 2 (two) times daily. 180 tablet 3   traMADol (ULTRAM) 50 MG tablet Take 1 tablet (50 mg total) by mouth every 6 (six) hours as needed. 120 tablet 3   triamcinolone cream (KENALOG) 0.1 % Apply 1 Application topically 2 (two) times daily. 450 g 3   No current facility-administered medications for this visit.    PHYSICAL EXAMINATION: ECOG PERFORMANCE STATUS: 3 - Symptomatic, >50% confined to bed Sedentary at home mainly due to lower extremity LE  Vitals:   07/07/22 0847  BP: (!) 128/56  Pulse: 60  Resp: 17  Temp: 98.2 F (36.8 C)  SpO2: 98%   Filed Weights    GENERAL:alert, no distress and comfortable SKIN: scattered small ecchymoses on  bilateral forearms EYES:  sclera clear NECK: Without mass LYMPH:  no palpable cervical or supraclavicular lymphadenopathy LUNGS: clear with normal breathing effort HEART: regular rate & rhythm, marked bilateral lower extremity edema ABDOMEN:abdomen soft, non-tender and normal bowel sounds NEURO: alert & oriented x 3 with fluent speech, exam performed in wheelchair Breast exam: No bilateral nipple discharge or inversion.  S/p left lumpectomy, incisions healed with scar tissue and distortion at the outer breast.  Mild-moderate lymphedema.  No palpable mass or nodularity in either breast or axilla that I could appreciate.  LABORATORY DATA:  I have reviewed the data as listed    Latest Ref Rng & Units 07/07/2022    8:26 AM 02/24/2022   11:33 AM 12/07/2021   10:36 AM  CBC  WBC 4.0 - 10.5 K/uL 2.3  2.7  3.8   Hemoglobin 12.0 - 15.0 g/dL 9.4  10.2  9.3   Hematocrit 36.0 - 46.0 % 29.2  31.2  29.1   Platelets 150 - 400 K/uL 92  96.0  141         Latest Ref Rng & Units 07/07/2022    8:26 AM 02/24/2022   11:33 AM 12/07/2021   10:36 AM  CMP  Glucose 70 - 99 mg/dL 89  100  97   BUN 8 - 23 mg/dL 35  31  59   Creatinine 0.44 - 1.00 mg/dL 1.72  1.55  2.12   Sodium 135 - 145 mmol/L 138  140  136   Potassium 3.5 - 5.1 mmol/L 4.1  3.7  4.5   Chloride 98 - 111 mmol/L 101  100  94   CO2 22 - 32 mmol/L 32  32  35   Calcium 8.9 -  10.3 mg/dL 9.7  9.8  10.1   Total Protein 6.5 - 8.1 g/dL 6.9  7.0    Total Bilirubin 0.3 - 1.2 mg/dL 0.9  1.9    Alkaline Phos 38 - 126 U/L 101  126    AST 15 - 41 U/L 13  21    ALT 0 - 44 U/L 8  15        RADIOGRAPHIC STUDIES: I have personally reviewed the radiological images as listed and agreed with the findings in the report. No results found.   ASSESSMENT & PLAN:  76y.o. Caucasian woman with screening detected left breast cancer   1.Pancytopenia -On chart review she has known anemia of chronic disease for at least 5 years, and mild intermittent  thrombocytopenia from 107 - normal during that time, and stable anemia at 9-11 range -She recently developed leukopenia in 02/24/22 with WBC 2.7/ANC 1.5, which has progressed today to Myton 0.8.  Platelets are lower at 64 K -She is asymptomatic -Today's vitamin B12, folate, and iron panel are normal.  Ferritin is pending -She has imaging evidence of cirrhosis on 07/21/2021 abdominal ultrasound which can cause low counts -We will hold tamoxifen to see if this is contributing -Repeat lab and follow-up as previously scheduled 09/08/2022 -I reviewed pancytopenic precautions, she knows to call if she develops fever, bruising/bleeding, or symptoms of anemia  2. Malignant neoplasm of upper-outer quadrant of left breast, invasive ductal carcinoma,  stage IA (pT1b(m)N0), grade 2, ER+,PR+,HER2-, oncotype RS 18 -s/p left lumpectomy on 04/04/2017 and re-excision of close margin on 6/202/018, she has had complete surgical resection, surgical margins were negative. Lymph nodes were negative. -the Oncotype Dx result shows intermediate risk based on the recurrence score, which predicts 10 year distant recurrence after 5 years of tamoxifen 12%. The benefit of chemotherapy in the intermediate risk group is small and controversial. Given her relatively low score in the intermediate group, adjuvant chemotherapy was not recommended and patient agreed -S/p adjuvant radiaiton 06/06/2017 to 07/19/2017 with Dr. Lisbeth Renshaw and tolerated well.  -She started Exemestane in 08/2017, changed to tamoxifen in 09/2021 due to osteoporosis  -Ms. Ormand is clinically doing well from a breast cancer standpoint. Tolerating tamoxifen. Breast exam shows lymphedema otherwise benign.  Recent mammogram 06/01/2022 was negative.  Overall no clinical concern for breast cancer recurrence -Continue surveillance -She has completed 5 years of antiestrogen therapy.  Given her progressive pancytopenia I recommend to hold tamoxifen to see if related, may  discontinue altogether. -  Repeat lab with next follow-up 11/2 and reevaluate   3. Chronic atrial fibrillation, hyperlipidemia, HTN, lymphedema, pulmonary hypertension, obesity, hypothyroidism, cirrhosis -Managed by her PCP and Cardiologist (Dr. Gwenlyn Found) -She has significant lymphedema in her lower legs -Cirrhosis managed by GI Dr. Therisa Doyne -Continue med regimen and follow-up with care team   4. Anemia of chronic disease (CKD) and iron deficiency  - iron level in December 2018, ferritin was 33, relatively low given her CKD.  -She started oral ferrous sulfate and receives IV Feraheme at nephrology intermittently -Anemia fluctuates, Hgb 9-11 -She does not receive ESA at nephrology   5. Osteoarthritis, arthralgia -followed by ortho at [facility], s/p injections  -Denies new pain today   6. Osteopenia  -08/2017 DEXA showed osteopenia with a T-Score of -1.4 at the left femur neck -she takes vitamin D, not on calcium by nephrology due to CKD - repeat 08/2020 showed borderline osteoporosis (T score -2.5) she is subsequently changed to tamoxifen in 09/2021   Plan: -Labs reviewed -  Continue breast cancer surveillance -Hold tamoxifen for pancytopenia -Pancytopenic and return precautions reviewed -Follow-up as previously scheduled 09/08/2022 with lab   All questions were answered. The patient knows to call the clinic with any problems, questions or concerns. No barriers to learning was detected.  A family member was present for translation. I spent 20 minutes counseling the patient face to face. The total time spent in the appointment was 30 minutes and more than 50% was on counseling and review of test results.      Katelyn Feeling, Katelyn Jackson 07/07/22

## 2022-07-07 ENCOUNTER — Inpatient Hospital Stay: Payer: Medicare HMO | Attending: Hematology

## 2022-07-07 ENCOUNTER — Other Ambulatory Visit: Payer: Self-pay | Admitting: Surgery

## 2022-07-07 ENCOUNTER — Encounter: Payer: Self-pay | Admitting: Nurse Practitioner

## 2022-07-07 ENCOUNTER — Inpatient Hospital Stay (HOSPITAL_BASED_OUTPATIENT_CLINIC_OR_DEPARTMENT_OTHER): Payer: Medicare HMO | Admitting: Nurse Practitioner

## 2022-07-07 ENCOUNTER — Other Ambulatory Visit: Payer: Self-pay

## 2022-07-07 ENCOUNTER — Telehealth: Payer: Self-pay

## 2022-07-07 VITALS — BP 128/56 | HR 60 | Temp 98.2°F | Resp 17 | Ht 61.0 in

## 2022-07-07 DIAGNOSIS — Z79810 Long term (current) use of selective estrogen receptor modulators (SERMs): Secondary | ICD-10-CM | POA: Diagnosis not present

## 2022-07-07 DIAGNOSIS — Z79811 Long term (current) use of aromatase inhibitors: Secondary | ICD-10-CM | POA: Insufficient documentation

## 2022-07-07 DIAGNOSIS — D61818 Other pancytopenia: Secondary | ICD-10-CM | POA: Diagnosis not present

## 2022-07-07 DIAGNOSIS — E669 Obesity, unspecified: Secondary | ICD-10-CM | POA: Diagnosis not present

## 2022-07-07 DIAGNOSIS — D638 Anemia in other chronic diseases classified elsewhere: Secondary | ICD-10-CM

## 2022-07-07 DIAGNOSIS — N184 Chronic kidney disease, stage 4 (severe): Secondary | ICD-10-CM | POA: Diagnosis not present

## 2022-07-07 DIAGNOSIS — Z170 Estrogen receptor positive status [ER+]: Secondary | ICD-10-CM

## 2022-07-07 DIAGNOSIS — C50412 Malignant neoplasm of upper-outer quadrant of left female breast: Secondary | ICD-10-CM

## 2022-07-07 DIAGNOSIS — E876 Hypokalemia: Secondary | ICD-10-CM | POA: Insufficient documentation

## 2022-07-07 DIAGNOSIS — Z9049 Acquired absence of other specified parts of digestive tract: Secondary | ICD-10-CM | POA: Diagnosis not present

## 2022-07-07 DIAGNOSIS — Z7901 Long term (current) use of anticoagulants: Secondary | ICD-10-CM | POA: Insufficient documentation

## 2022-07-07 DIAGNOSIS — I129 Hypertensive chronic kidney disease with stage 1 through stage 4 chronic kidney disease, or unspecified chronic kidney disease: Secondary | ICD-10-CM | POA: Insufficient documentation

## 2022-07-07 DIAGNOSIS — Z79899 Other long term (current) drug therapy: Secondary | ICD-10-CM | POA: Diagnosis not present

## 2022-07-07 DIAGNOSIS — E785 Hyperlipidemia, unspecified: Secondary | ICD-10-CM | POA: Insufficient documentation

## 2022-07-07 DIAGNOSIS — Z880 Allergy status to penicillin: Secondary | ICD-10-CM | POA: Insufficient documentation

## 2022-07-07 DIAGNOSIS — I890 Lymphedema, not elsewhere classified: Secondary | ICD-10-CM | POA: Insufficient documentation

## 2022-07-07 DIAGNOSIS — Z79890 Hormone replacement therapy (postmenopausal): Secondary | ICD-10-CM | POA: Diagnosis not present

## 2022-07-07 LAB — CBC WITH DIFFERENTIAL (CANCER CENTER ONLY)
Abs Immature Granulocytes: 0 10*3/uL (ref 0.00–0.07)
Basophils Absolute: 0 10*3/uL (ref 0.0–0.1)
Basophils Relative: 1 %
Eosinophils Absolute: 0.2 10*3/uL (ref 0.0–0.5)
Eosinophils Relative: 9 %
HCT: 29.2 % — ABNORMAL LOW (ref 36.0–46.0)
Hemoglobin: 9.4 g/dL — ABNORMAL LOW (ref 12.0–15.0)
Immature Granulocytes: 0 %
Lymphocytes Relative: 38 %
Lymphs Abs: 0.9 10*3/uL (ref 0.7–4.0)
MCH: 31.2 pg (ref 26.0–34.0)
MCHC: 32.2 g/dL (ref 30.0–36.0)
MCV: 97 fL (ref 80.0–100.0)
Monocytes Absolute: 0.4 10*3/uL (ref 0.1–1.0)
Monocytes Relative: 16 %
Neutro Abs: 0.8 10*3/uL — ABNORMAL LOW (ref 1.7–7.7)
Neutrophils Relative %: 36 %
Platelet Count: 92 10*3/uL — ABNORMAL LOW (ref 150–400)
RBC: 3.01 MIL/uL — ABNORMAL LOW (ref 3.87–5.11)
RDW: 15.8 % — ABNORMAL HIGH (ref 11.5–15.5)
WBC Count: 2.3 10*3/uL — ABNORMAL LOW (ref 4.0–10.5)
nRBC: 0 % (ref 0.0–0.2)

## 2022-07-07 LAB — CMP (CANCER CENTER ONLY)
ALT: 8 U/L (ref 0–44)
AST: 13 U/L — ABNORMAL LOW (ref 15–41)
Albumin: 3.8 g/dL (ref 3.5–5.0)
Alkaline Phosphatase: 101 U/L (ref 38–126)
Anion gap: 5 (ref 5–15)
BUN: 35 mg/dL — ABNORMAL HIGH (ref 8–23)
CO2: 32 mmol/L (ref 22–32)
Calcium: 9.7 mg/dL (ref 8.9–10.3)
Chloride: 101 mmol/L (ref 98–111)
Creatinine: 1.72 mg/dL — ABNORMAL HIGH (ref 0.44–1.00)
GFR, Estimated: 30 mL/min — ABNORMAL LOW (ref >60)
Glucose, Bld: 89 mg/dL (ref 70–99)
Potassium: 4.1 mmol/L (ref 3.5–5.1)
Sodium: 138 mmol/L (ref 135–145)
Total Bilirubin: 0.9 mg/dL (ref 0.3–1.2)
Total Protein: 6.9 g/dL (ref 6.5–8.1)

## 2022-07-07 LAB — FOLATE: Folate: >40 ng/mL (ref >5.9)

## 2022-07-07 LAB — IRON AND IRON BINDING CAPACITY (CC-WL,HP ONLY)
Iron: 45 ug/dL (ref 28–170)
Saturation Ratios: 11 % (ref 10.4–31.8)
TIBC: 426 ug/dL (ref 250–450)
UIBC: 381 ug/dL (ref 148–442)

## 2022-07-07 LAB — FERRITIN: Ferritin: 23 ng/mL (ref 11–307)

## 2022-07-07 LAB — VITAMIN B12: Vitamin B-12: 753 pg/mL (ref 180–914)

## 2022-07-07 NOTE — Progress Notes (Signed)
Epic faxed pt's CBC w/diff to Dr. Johnney Ou at [facility].  Epic fax confirmation confirmed.

## 2022-07-07 NOTE — Telephone Encounter (Signed)
Spoke with Katelyn Jackson via telephone regarding pt's H60 and Folic acid lab results.  Informed Katelyn Jackson that the pt's V37 and Folic acid are WNL and Lacie feels they are NOT the cause of the pt's pancytopenia.  Regan Rakers is instructing pt to continue to not take the Tamoxifen and that she will redraw the pt's labs when she returns to clinic in November to see Lacie.  Katelyn Jackson verbalized understanding and has no further questions or concerns at this time.

## 2022-07-16 DIAGNOSIS — Z45018 Encounter for adjustment and management of other part of cardiac pacemaker: Secondary | ICD-10-CM | POA: Diagnosis not present

## 2022-07-22 ENCOUNTER — Other Ambulatory Visit (HOSPITAL_COMMUNITY): Payer: Self-pay | Admitting: *Deleted

## 2022-07-25 ENCOUNTER — Encounter (HOSPITAL_COMMUNITY)
Admission: RE | Admit: 2022-07-25 | Discharge: 2022-07-25 | Disposition: A | Discharge status: Home / Self Care | Payer: Medicare HMO | Source: Ambulatory Visit | Attending: Internal Medicine | Admitting: Internal Medicine

## 2022-07-25 DIAGNOSIS — N189 Chronic kidney disease, unspecified: Secondary | ICD-10-CM | POA: Diagnosis not present

## 2022-07-25 DIAGNOSIS — D631 Anemia in chronic kidney disease: Secondary | ICD-10-CM | POA: Insufficient documentation

## 2022-07-25 MED ORDER — SODIUM CHLORIDE 0.9 % IV SOLN
510.0000 mg | INTRAVENOUS | Status: DC
  Administered 2022-07-25: 510 mg via INTRAVENOUS
  Filled 2022-07-25: qty 510

## 2022-08-01 ENCOUNTER — Encounter (HOSPITAL_COMMUNITY)
Admission: RE | Admit: 2022-08-01 | Discharge: 2022-08-01 | Disposition: A | Discharge status: Home / Self Care | Payer: Medicare HMO | Source: Ambulatory Visit | Attending: Internal Medicine | Admitting: Internal Medicine

## 2022-08-01 DIAGNOSIS — D631 Anemia in chronic kidney disease: Secondary | ICD-10-CM | POA: Diagnosis not present

## 2022-08-01 DIAGNOSIS — N189 Chronic kidney disease, unspecified: Secondary | ICD-10-CM | POA: Diagnosis not present

## 2022-08-01 MED ORDER — SODIUM CHLORIDE 0.9 % IV SOLN
510.0000 mg | INTRAVENOUS | Status: DC
  Administered 2022-08-01: 510 mg via INTRAVENOUS
  Filled 2022-08-01: qty 17

## 2022-08-03 ENCOUNTER — Ambulatory Visit (INDEPENDENT_AMBULATORY_CARE_PROVIDER_SITE_OTHER): Payer: Medicare HMO | Admitting: Podiatry

## 2022-08-03 ENCOUNTER — Encounter: Payer: Self-pay | Admitting: Podiatry

## 2022-08-03 DIAGNOSIS — D689 Coagulation defect, unspecified: Secondary | ICD-10-CM | POA: Diagnosis not present

## 2022-08-03 DIAGNOSIS — I890 Lymphedema, not elsewhere classified: Secondary | ICD-10-CM

## 2022-08-03 DIAGNOSIS — B351 Tinea unguium: Secondary | ICD-10-CM

## 2022-08-03 NOTE — Progress Notes (Signed)
This patient returns to my office for at risk foot care.  This patient requires this care by a professional since this patient will be at risk due to having lymphedema and coagulation defect.  Patient is taking eliquis.  This patient is unable to cut nails herself since the patient cannot reach her nails.These nails are painful walking and wearing shoes.  This patient presents for at risk foot care today.  She presents to the office in a wheelchair with her daughter.  General Appearance  Alert, conversant and in no acute stress.  Vascular  Dorsalis pedis and posterior tibial  pulses are not palpable  due to lymphedema  bilaterally.  Capillary return is within normal limits  bilaterally. Temperature is within normal limits  bilaterally.  Neurologic  Senn-Weinstein monofilament wire test within normal limits  bilaterally. Muscle power within normal limits bilaterally.  Nails Thick disfigured discolored nails with subungual debris  from hallux to fifth toes bilaterally. No evidence of bacterial infection or drainage bilaterally.  Orthopedic  No limitations of motion  feet .  No crepitus or effusions noted.  No bony pathology or digital deformities noted.  Skin  normotropic skin with no porokeratosis noted bilaterally.  No signs of infections or ulcers noted.     Onychomycosis  Pain in right toes  Pain in left toes  Consent was obtained for treatment procedures.   Mechanical debridement of nails 1-5  bilaterally performed with a nail nipper.  Filed with dremel without incident.  There was fluid on her napkin due to lymphedema.   Return office visit   3 months                   Told patient to return for periodic foot care and evaluation due to potential at risk complications.   Gardiner Barefoot DPM

## 2022-08-10 ENCOUNTER — Telehealth: Payer: Self-pay | Admitting: *Deleted

## 2022-08-10 NOTE — Telephone Encounter (Signed)
Rec'd determination med was APPROVED effective 11/07/2021 through 11/07/2023. Faxed approval tp [facility].Marland KitchenJohny Chess

## 2022-08-10 NOTE — Telephone Encounter (Signed)
Need PA on Omeprazole 40 mg submitted w/ (Key: [id number]) Rec'd msg Your information has been sent to [facility].Marland KitchenJohny Chess

## 2022-08-15 ENCOUNTER — Other Ambulatory Visit: Payer: Self-pay | Admitting: Internal Medicine

## 2022-08-29 DIAGNOSIS — R0600 Dyspnea, unspecified: Secondary | ICD-10-CM | POA: Diagnosis not present

## 2022-08-30 DIAGNOSIS — R0600 Dyspnea, unspecified: Secondary | ICD-10-CM | POA: Diagnosis not present

## 2022-08-31 DIAGNOSIS — R0600 Dyspnea, unspecified: Secondary | ICD-10-CM | POA: Diagnosis not present

## 2022-09-01 DIAGNOSIS — R0600 Dyspnea, unspecified: Secondary | ICD-10-CM | POA: Diagnosis not present

## 2022-09-08 ENCOUNTER — Inpatient Hospital Stay (HOSPITAL_BASED_OUTPATIENT_CLINIC_OR_DEPARTMENT_OTHER): Payer: Medicare HMO | Admitting: Hematology

## 2022-09-08 ENCOUNTER — Inpatient Hospital Stay: Payer: Medicare HMO | Attending: Hematology

## 2022-09-08 ENCOUNTER — Encounter: Payer: Self-pay | Admitting: Hematology

## 2022-09-08 ENCOUNTER — Other Ambulatory Visit: Payer: Self-pay

## 2022-09-08 VITALS — BP 142/81 | HR 60 | Temp 98.2°F | Resp 18

## 2022-09-08 DIAGNOSIS — Z9049 Acquired absence of other specified parts of digestive tract: Secondary | ICD-10-CM | POA: Diagnosis not present

## 2022-09-08 DIAGNOSIS — E669 Obesity, unspecified: Secondary | ICD-10-CM | POA: Diagnosis not present

## 2022-09-08 DIAGNOSIS — Z79811 Long term (current) use of aromatase inhibitors: Secondary | ICD-10-CM | POA: Insufficient documentation

## 2022-09-08 DIAGNOSIS — K7460 Unspecified cirrhosis of liver: Secondary | ICD-10-CM | POA: Insufficient documentation

## 2022-09-08 DIAGNOSIS — Z1231 Encounter for screening mammogram for malignant neoplasm of breast: Secondary | ICD-10-CM | POA: Diagnosis not present

## 2022-09-08 DIAGNOSIS — Z7901 Long term (current) use of anticoagulants: Secondary | ICD-10-CM | POA: Insufficient documentation

## 2022-09-08 DIAGNOSIS — D696 Thrombocytopenia, unspecified: Secondary | ICD-10-CM | POA: Insufficient documentation

## 2022-09-08 DIAGNOSIS — C50412 Malignant neoplasm of upper-outer quadrant of left female breast: Secondary | ICD-10-CM | POA: Insufficient documentation

## 2022-09-08 DIAGNOSIS — I890 Lymphedema, not elsewhere classified: Secondary | ICD-10-CM | POA: Insufficient documentation

## 2022-09-08 DIAGNOSIS — D638 Anemia in other chronic diseases classified elsewhere: Secondary | ICD-10-CM | POA: Insufficient documentation

## 2022-09-08 DIAGNOSIS — Z950 Presence of cardiac pacemaker: Secondary | ICD-10-CM | POA: Diagnosis not present

## 2022-09-08 DIAGNOSIS — Z923 Personal history of irradiation: Secondary | ICD-10-CM | POA: Diagnosis not present

## 2022-09-08 DIAGNOSIS — D61818 Other pancytopenia: Secondary | ICD-10-CM | POA: Insufficient documentation

## 2022-09-08 DIAGNOSIS — I272 Pulmonary hypertension, unspecified: Secondary | ICD-10-CM | POA: Diagnosis not present

## 2022-09-08 DIAGNOSIS — E039 Hypothyroidism, unspecified: Secondary | ICD-10-CM | POA: Diagnosis not present

## 2022-09-08 DIAGNOSIS — M85852 Other specified disorders of bone density and structure, left thigh: Secondary | ICD-10-CM | POA: Diagnosis not present

## 2022-09-08 DIAGNOSIS — E785 Hyperlipidemia, unspecified: Secondary | ICD-10-CM | POA: Insufficient documentation

## 2022-09-08 DIAGNOSIS — N184 Chronic kidney disease, stage 4 (severe): Secondary | ICD-10-CM | POA: Diagnosis not present

## 2022-09-08 DIAGNOSIS — Z170 Estrogen receptor positive status [ER+]: Secondary | ICD-10-CM | POA: Diagnosis not present

## 2022-09-08 DIAGNOSIS — I129 Hypertensive chronic kidney disease with stage 1 through stage 4 chronic kidney disease, or unspecified chronic kidney disease: Secondary | ICD-10-CM | POA: Insufficient documentation

## 2022-09-08 DIAGNOSIS — N6002 Solitary cyst of left breast: Secondary | ICD-10-CM | POA: Insufficient documentation

## 2022-09-08 DIAGNOSIS — Z79899 Other long term (current) drug therapy: Secondary | ICD-10-CM | POA: Insufficient documentation

## 2022-09-08 DIAGNOSIS — Z880 Allergy status to penicillin: Secondary | ICD-10-CM | POA: Insufficient documentation

## 2022-09-08 LAB — CMP (CANCER CENTER ONLY)
ALT: 11 U/L (ref 0–44)
AST: 15 U/L (ref 15–41)
Albumin: 3.8 g/dL (ref 3.5–5.0)
Alkaline Phosphatase: 165 U/L — ABNORMAL HIGH (ref 38–126)
Anion gap: 6 (ref 5–15)
BUN: 22 mg/dL (ref 8–23)
CO2: 31 mmol/L (ref 22–32)
Calcium: 9.5 mg/dL (ref 8.9–10.3)
Chloride: 102 mmol/L (ref 98–111)
Creatinine: 1.59 mg/dL — ABNORMAL HIGH (ref 0.44–1.00)
GFR, Estimated: 33 mL/min — ABNORMAL LOW (ref >60)
Glucose, Bld: 85 mg/dL (ref 70–99)
Potassium: 3.3 mmol/L — ABNORMAL LOW (ref 3.5–5.1)
Sodium: 139 mmol/L (ref 135–145)
Total Bilirubin: 1.3 mg/dL — ABNORMAL HIGH (ref 0.3–1.2)
Total Protein: 7.3 g/dL (ref 6.5–8.1)

## 2022-09-08 LAB — CBC WITH DIFFERENTIAL (CANCER CENTER ONLY)
Abs Immature Granulocytes: 0 10*3/uL (ref 0.00–0.07)
Basophils Absolute: 0 10*3/uL (ref 0.0–0.1)
Basophils Relative: 1 %
Eosinophils Absolute: 0.3 10*3/uL (ref 0.0–0.5)
Eosinophils Relative: 10 %
HCT: 32.7 % — ABNORMAL LOW (ref 36.0–46.0)
Hemoglobin: 10.5 g/dL — ABNORMAL LOW (ref 12.0–15.0)
Immature Granulocytes: 0 %
Lymphocytes Relative: 32 %
Lymphs Abs: 1.1 10*3/uL (ref 0.7–4.0)
MCH: 32.6 pg (ref 26.0–34.0)
MCHC: 32.1 g/dL (ref 30.0–36.0)
MCV: 101.6 fL — ABNORMAL HIGH (ref 80.0–100.0)
Monocytes Absolute: 0.6 10*3/uL (ref 0.1–1.0)
Monocytes Relative: 18 %
Neutro Abs: 1.3 10*3/uL — ABNORMAL LOW (ref 1.7–7.7)
Neutrophils Relative %: 39 %
Platelet Count: 106 10*3/uL — ABNORMAL LOW (ref 150–400)
RBC: 3.22 MIL/uL — ABNORMAL LOW (ref 3.87–5.11)
RDW: 16.5 % — ABNORMAL HIGH (ref 11.5–15.5)
WBC Count: 3.4 10*3/uL — ABNORMAL LOW (ref 4.0–10.5)
nRBC: 0 % (ref 0.0–0.2)

## 2022-09-08 LAB — FERRITIN: Ferritin: 107 ng/mL (ref 11–307)

## 2022-09-08 LAB — IRON AND IRON BINDING CAPACITY (CC-WL,HP ONLY)
Iron: 79 ug/dL (ref 28–170)
Saturation Ratios: 24 % (ref 10.4–31.8)
TIBC: 323 ug/dL (ref 250–450)
UIBC: 244 ug/dL (ref 148–442)

## 2022-09-08 NOTE — Progress Notes (Signed)
Katelyn Jackson   [phone] [phone]   Clinic Follow up Note   Patient Care Team: Plotnikov, [name], MD as PCP - General (Internal Medicine) [name], MD as Consulting Physician (General Surgery) Truitt Merle, MD as Consulting Physician (Hematology) Kyung Rudd, MD as Consulting Physician (Radiation Oncology) Estanislado Emms, MD (Inactive) as Consulting Physician (Nephrology) Lorretta Harp, MD as Consulting Physician (Cardiology) Verner Chol, MD as Consulting Physician (Sports Medicine) Delice Bison, Charlestine Massed, NP as Nurse Practitioner (Hematology and Oncology) [facility] as Consulting Physician (Ophthalmology) Szabat, Darnelle Maffucci, [facility] (Inactive) as Pharmacist (Pharmacist) Antionette Char as Physician Assistant (Radiology) Ronnette Juniper, MD as Consulting Physician (Gastroenterology) Justin Mend, MD as Attending Physician (Nephrology)  Date of Service:  09/08/2022  CHIEF COMPLAINT: f/u of pancytopenia, left breast cancer  CURRENT THERAPY:  Surveillance  ASSESSMENT & PLAN:  Katelyn Jackson is a 76 y.o. post-menopausal female with   1. Pancytopenia, likely secondary to liver cirrhosis  -h/o anemia of chronic disease with mild intermittent thrombocytopenia. She has received IV Feraheme with nephrology as needed. -She recently developed leukopenia in 02/24/22 with WBC 2.7/ANC 1.5, which progressed on 07/07/22 to [place] 0.8.  Platelets are lower at 18 K -She is asymptomatic. Tamoxifen discontinued 07/07/22. -labs reviewed, WBC 3.4/ANC 1.3, plt 106k, hgb 10.4. This is improved from last visit. I recommend she continue f/u with nephrology and GI for management of her CKD and cirrhosis. -lab and f/u in 1 year.   2. Malignant neoplasm of upper-outer quadrant of left breast, invasive ductal carcinoma,  stage IA (pT1b(m)N0), grade 2, ER+,PR+,HER2-, oncotype RS 18 -diagnosed 02/2017, s/p left lumpectomy on  04/04/17. -Oncotype RS 18, intermediate risk, adjuvant chemo ultimately not recommended. -S/p radiation 06/06/17 - 07/19/17 with Dr. Lisbeth Renshaw -She started Exemestane in 08/2017, changed to tamoxifen in 09/2021 due to osteoporosis  -most recent mammogram 06/01/22 was negative. -tamoxifen discontinued 06/2022 due to progressive pancytopenia. I do not plan to restart given she was close to completing 5 years.   3. Comorbidities: A.fib, HTN, lymphedema, pulmonary HTN, obesity, hypothyroidism, cirrhosis -Managed by her PCP and Cardiologist (Dr. Gwenlyn Found) -She has significant lymphedema in her lower legs -Cirrhosis managed by GI Dr. Therisa Doyne -Continue med regimen and follow-up with care team     Plan: -mammogram due 05/2023, I ordered today -lab and f/u in 1 year -She will follow-up with PCP Dr. [name] and nephrologist for lab monitoring including CBC and iron level    No problem-specific Assessment & Plan notes found for this encounter.   SUMMARY OF ONCOLOGIC HISTORY: Oncology History Overview Note  Cancer Staging Malignant neoplasm of upper-outer quadrant of left breast in female, estrogen receptor positive ([place]) Staging form: Breast, AJCC 8th Edition - Clinical stage from 02/06/2017: Stage IA (cT1b(m), cN0, cM0, G1, ER: Positive, PR: Positive, HER2: Negative) - Signed by Truitt Merle, MD on 02/14/2017 - Pathologic stage from 04/04/2017: Stage IA (pT1b(m), pN0, cM0, G2, ER: Positive, PR: Positive, HER2: Negative, Oncotype DX score: 18) - Signed by Truitt Merle, MD on 06/30/2017     Malignant neoplasm of upper-outer quadrant of left breast in female, estrogen receptor positive ([place])  01/26/2017 Mammogram   Screening mammogram on 01/26/17 showed possible asymmetries in the left breast.    02/02/2017 Mammogram   Diagnostic mammogram and [place]: 1. An irregular hypoechoic mass in the left breast at 1 o'clock 7 cm from the nipple measuring 6 x 3 x 4 mm.  2. There is an irregular hypoechoic  mass in left breast at  3 o'clock 7 cm from the nipple measuring 4 x 4 x 3 mm.  3. There is a subtle hypoechoic mass in the left breast at 2:30 5 cm from the nipple measuring 3 x 3 x 3 mm 4. There are multiple other small cysts in the left breast  5. Left axilla (-) on [place]    02/06/2017 Initial Biopsy   Diagnosis 1. Breast, left, needle core biopsy, 1:00 o'clock, 7 CMFN - INVASIVE DUCTAL CARCINOMA, SEE COMMENT. - DUCTAL CARCINOMA IN SITU. 2. Breast, left, needle core biopsy, 3:00 o'clock, 7 CMFN - INVASIVE DUCTAL CARCINOMA, SEE COMMENT. - DUCTAL CARCINOMA IN SITU. Microscopic Comment 1. The carcinoma in parts #1 and 2 appear morphologically similar and grade 1.   02/06/2017 Receptors her2   Both biopsy ER 100%+, PR 100%+, HER2-, Ki67 10%   02/06/2017 Initial Diagnosis   Malignant neoplasm of upper-outer quadrant of left breast in female, estrogen receptor positive ([place])   04/04/2017 Surgery   Left breast lumpectomy and SLN biopsy    04/04/2017 Pathology Results   Diagnosis 1. Breast, lumpectomy, Left - MULTIFOCAL INVASIVE DUCTAL CARCINOMA, [place] GRADE 2 OF 3, 0.9 CM - DUCTAL CARCINOMA IN SITU, LOW GRADE (<1MM FROM MEDIAL MARGIN) - FIBROCYSTIC AND COLUMNAR CELL CHANGE - CALCIFICATIONS ASSOCIATED WITH CARCINOMA AND BENIGN DISEASE - MARGINS UNINVOLVED BY CARCINOMA - PREVIOUS BIOPSY SITE CHANGES - SEE ONCOLOGY TABLE BELOW 2. Lymph node, sentinel, biopsy, Left Axillary #1 - NO CARCINOMA IDENTIFIED IN ONE LYMPH NODE (0/1) 3. Lymph node, sentinel, biopsy, Left Axillary #2 - NO CARCINOMA IDENTIFIED IN ONE LYMPH NODE (0/1)   04/04/2017 Oncotype testing   RS 18, which predicts 10-year distant recurrence risk of 12% with tamoxifen    04/26/2017 Surgery   Re-excision for close margin, final pathyology was negative for cancer    06/06/2017 - 07/19/2017 Radiation Therapy   The Left breast was treated to 42.5 Gy in 17 fractions at 2.5 Gy per fraction. 2. The Left breast was boosted to 7.5 Gy in 3 fractions at 2.5  Gy per fraction.      06/18/2017 - 06/23/2017 Hospital Admission   Admit date: 06/18/17 Admission diagnosis: Intractable lower back pain- sciatica  Additional comments: Sent home with course of steroids and referral for f/u for back injections. Did have AKI w/ CR at 2/0, up from her baseline of 1.6. This slowed with IVF. Hypokalemia improved with K replacement. Radiotherapy was stopped at that time as she was unable to lay on her back due to pain.    08/2017 -  Anti-estrogen oral therapy   Exemestane 25 mg dialy starting 08/2017     08/28/2017 Imaging   BONE DENSITY SCAN  ASSESSMENT: The BMD measured at Femur Neck Left is 0.848 g/cm2 with a T-score of -1.4.   DualFemur Neck Left 08/28/2017    71.7         -1.4    0.848 g/cm2  AP Spine  L1-L3     08/28/2017    71.7         -1.1    1.052 g/cm2   01/29/2018 Mammogram   IMPRESSION: 1. No mammographic evidence of breast malignancy. 2. Surgical and radiation changes within the LEFT breast.      INTERVAL HISTORY:  Katelyn Jackson is here for a follow up of breast cancer. She was last seen by NP Lacie on 07/07/22. She presents to the clinic accompanied by her daughter-in-law, who acts as our Optometrist. They  tell me she is home from rehab now.   All other systems were reviewed with the patient and are negative.  MEDICAL HISTORY:  Past Medical History:  Diagnosis Date   Arthritis    Breast lesion 12/07/2021   Cancer [facility])    breast cancer   Chronic atrial fibrillation (HCC)    Chronic kidney disease    sees Dr Florene Glen   Colonization with VRE (vancomycin-resistant enterococcus) 09/22/2021   Cramps, muscle, general    Dry skin 09/22/2021   Dyspnea on exertion    Dysrhythmia    a-fib,    ESBL E. coli carrier 09/22/2021   GERD (gastroesophageal reflux disease)    Headache    Herpes labialis 08/19/2021   Hyperlipidemia    Hypertension    Hypothyroidism    Lymphedema    Moderate to severe pulmonary hypertension ([place])     Obesity    Osteoarthritis of right knee 08/19/2021   Personal history of radiation therapy    Pneumonia 12/2020   Presence of permanent cardiac pacemaker 01/21/2021   for bradycardia    Syncope 12/2020   needed a pacemaker    SURGICAL HISTORY: Past Surgical History:  Procedure Laterality Date   APPENDECTOMY     BREAST BIOPSY Left 2018   BREAST LUMPECTOMY Left 04/04/2017   x3   BREAST LUMPECTOMY WITH NEEDLE LOCALIZATION AND AXILLARY SENTINEL LYMPH NODE BX Left 04/04/2017   Procedure: BREAST LUMPECTOMY WITH NEEDLE LOCALIZATION x3 AND AXILLARY SENTINEL LYMPH NODE BX;  Surgeon: [name], MD;  Location: [place];  Service: General;  Laterality: Left;   CATARACT EXTRACTION W/ INTRAOCULAR LENS  IMPLANT, BILATERAL Bilateral 2018   COLONOSCOPY     EYE SURGERY     GLAUCOMA SURGERY Bilateral 2018   HARDWARE REMOVAL Right 07/26/2021   Procedure: RIGHT SHOULDER HARDWARE REMOVAL WITH WASHOUT;  Surgeon: Hiram Gash, MD;  Location: [facility];  Service: Orthopedics;  Laterality: Right;   INSERT / REPLACE / REMOVE PACEMAKER  01/21/2021   IR FLUORO GUIDE CV LINE LEFT  07/29/2021   IR REMOVAL TUN CV CATH W/O FL  09/07/2021   IR US GUIDE BX ASP/DRAIN  07/16/2021   RE-EXCISION OF BREAST CANCER,SUPERIOR MARGINS Left 04/26/2017   Procedure: RE-EXCISION OF LEFT BREAST CANCER;  Surgeon: [name], MD;  Location: [place];  Service: General;  Laterality: Left;   SHOULDER HEMI-ARTHROPLASTY Right 03/17/2021   Procedure: SHOULDER HEMI-ARTHROPLASTY;  Surgeon: Hiram Gash, MD;  Location: [facility];  Service: Orthopedics;  Laterality: Right;    I have reviewed the social history and family history with the patient and they are unchanged from previous note.  ALLERGIES:  is allergic to chlorhexidine gluconate and penicillins.  MEDICATIONS:  Current Outpatient Medications  Medication Sig Dispense Refill   acetaminophen (TYLENOL) 325 MG tablet Take 2 tablets (650 mg total) by mouth every 6 (six) hours as needed for  mild pain or moderate pain (or Fever >/= 101).     allopurinol (ZYLOPRIM) 100 MG tablet Take 0.5 tablets (50 mg total) by mouth daily. 90 tablet 1   apixaban (ELIQUIS) 5 MG TABS tablet Take 1 tablet (5 mg total) by mouth 2 (two) times daily. 180 tablet 3   bisacodyl (DULCOLAX) 10 MG suppository Place 10 mg rectally daily as needed for moderate constipation.     carvedilol (COREG) 6.25 MG tablet Take 1 tablet (6.25 mg total) by mouth 2 (two) times daily. 180 tablet 3   Cholecalciferol (VITAMIN D3) 50 MCG (2000 UT) capsule  Take 1 capsule (2,000 Units total) by mouth daily. 100 capsule 3   Cyanocobalamin (VITAMIN B-12 PO) Take 2,000 mcg by mouth daily.     diclofenac Sodium (VOLTAREN) 1 % GEL APPLY FOUR GRAMS FOUR TIMES DAILY AS NEEDED 300 g 5   docusate sodium (COLACE) 100 MG capsule Take 1 capsule (100 mg total) by mouth 2 (two) times daily. 90 capsule 3   lactulose (CHRONULAC) 10 GM/15ML solution TAKE 15 MILLILITERS BY MOUTH THREE TIMESDAILY 946 mL 3   levothyroxine (SYNTHROID) 50 MCG tablet Take 1 tablet (50 mcg total) by mouth daily before breakfast. Overdue for Annual appt must see provider for future refills 90 tablet 3   lipase/protease/amylase (CREON) 12000-38000 units CPEP capsule Take 1 capsule (12,000 Units total) by mouth daily as needed (stomach problems). 270 capsule 3   magnesium oxide (MAG-OX) 400 MG tablet Take 1 tablet (400 mg total) by mouth 2 (two) times daily. 180 tablet 3   methocarbamol (ROBAXIN) 500 MG tablet Take 1 tablet (500 mg total) by mouth every 8 (eight) hours as needed for muscle spasms. 20 tablet 0   omeprazole (PRILOSEC) 40 MG capsule TAKE ONE CAPSULE BY MOUTH DAILY 90 capsule 3   OVER THE COUNTER MEDICATION Apply 1 application topically daily as needed (apply to buttocks  as needed for irritaiton). Intensive Skin Care Therapy     polyethylene glycol (MIRALAX / GLYCOLAX) 17 g packet Take 17 g by mouth 2 (two) times daily.     polyvinyl alcohol (LIQUIFILM TEARS) 1.4 %  ophthalmic solution Place 1 drop into both eyes as needed for dry eyes.     potassium chloride (KLOR-CON) 8 MEQ tablet Take 1 tablet (8 mEq total) by mouth daily. 90 tablet 3   pravastatin (PRAVACHOL) 20 MG tablet Take 1 tablet (20 mg total) by mouth daily at 6 PM. 90 tablet 3   rifaximin (XIFAXAN) 550 MG TABS tablet Take 1 tablet (550 mg total) by mouth 2 (two) times daily. 60 tablet 11   sennosides-docusate sodium (SENOKOT-S) 8.6-50 MG tablet Take 1 tablet by mouth daily. 90 tablet 3   simethicone (MYLICON) 40 DX/8.3JA drops Take 0.3 mLs (20 mg total) by mouth 4 (four) times daily as needed for flatulence.     tamoxifen (NOLVADEX) 20 MG tablet Take 1 tablet (20 mg total) by mouth daily. Will take after finishing Exemestane 90 tablet 3   torsemide (DEMADEX) 100 MG tablet Take 1 tablet (100 mg total) by mouth 2 (two) times daily. 180 tablet 3   traMADol (ULTRAM) 50 MG tablet Take 1 tablet (50 mg total) by mouth every 6 (six) hours as needed. 120 tablet 3   triamcinolone cream (KENALOG) 0.1 % Apply 1 Application topically 2 (two) times daily. 450 g 3   No current facility-administered medications for this visit.    PHYSICAL EXAMINATION: ECOG PERFORMANCE STATUS: 3 - Symptomatic, >50% confined to bed  Vitals:   09/08/22 1041  BP: (!) 142/81  Pulse: 60  Resp: 18  Temp: 98.2 F (36.8 C)  SpO2: 100%   Wt Readings from Last 3 Encounters:  07/25/22 205 lb (93 kg)  02/24/22 209 lb (94.8 kg)  01/13/22 208 lb 2 oz (94.4 kg)     GENERAL:alert, no distress and comfortable SKIN: skin color, texture, turgor are normal, no rashes or significant lesions EYES: normal, Conjunctiva are pink and non-injected, sclera clear  HEART: (+) significant lower extremity edema NEURO: alert & oriented x 3 with fluent speech, no focal motor/sensory deficits  LABORATORY DATA:  I have reviewed the data as listed    Latest Ref Rng & Units 09/08/2022   10:25 AM 07/07/2022    8:26 AM 02/24/2022   11:33 AM  CBC   WBC 4.0 - 10.5 K/uL 3.4  2.3  2.7   Hemoglobin 12.0 - 15.0 g/dL 10.5  9.4  10.2   Hematocrit 36.0 - 46.0 % 32.7  29.2  31.2   Platelets 150 - 400 K/uL 106  92  96.0         Latest Ref Rng & Units 09/08/2022   10:25 AM 07/07/2022    8:26 AM 02/24/2022   11:33 AM  CMP  Glucose 70 - 99 mg/dL 85  89  100   BUN 8 - 23 mg/dL 22  35  31   Creatinine 0.44 - 1.00 mg/dL 1.59  1.72  1.55   Sodium 135 - 145 mmol/L 139  138  140   Potassium 3.5 - 5.1 mmol/L 3.3  4.1  3.7   Chloride 98 - 111 mmol/L 102  101  100   CO2 22 - 32 mmol/L 31  32  32   Calcium 8.9 - 10.3 mg/dL 9.5  9.7  9.8   Total Protein 6.5 - 8.1 g/dL 7.3  6.9  7.0   Total Bilirubin 0.3 - 1.2 mg/dL 1.3  0.9  1.9   Alkaline Phos 38 - 126 U/L 165  101  126   AST 15 - 41 U/L _0 ALT 0 - 44 U/L _1 RADIOGRAPHIC STUDIES: I have personally reviewed the radiological images as listed and agreed with the findings in the report. No results found.    Orders Placed This Encounter  Procedures   MM Digital Screening    Standing Status:   Future    Standing Expiration Date:   09/08/2023    Order Specific Question:   Reason for Exam (SYMPTOM  OR DIAGNOSIS REQUIRED)    Answer:   screening    Order Specific Question:   Preferred imaging location?    Answer:   [facility]   All questions were answered. The patient knows to call the clinic with any problems, questions or concerns. No barriers to learning was detected. The total time spent in the appointment was 30 minutes.     Truitt Merle, MD 09/08/2022   I, Wilburn Mylar, am acting as scribe for Truitt Merle, MD.   I have reviewed the above documentation for accuracy and completeness, and I agree with the above.

## 2022-09-09 ENCOUNTER — Telehealth: Payer: Self-pay | Admitting: Hematology

## 2022-09-09 NOTE — Telephone Encounter (Signed)
Spoke with patient son to confirm next year appointments

## 2022-09-13 ENCOUNTER — Telehealth: Payer: Self-pay

## 2022-09-13 NOTE — Telephone Encounter (Signed)
Per Dr. Ernestina Penna my chart message, called and spoke with patient's daughter-in-law Katrina to advise that K was 3.3 mmol/L. Patient verified to Katrina that she has been taking her prescribed K. Patient instructed to take and additional 8 mEq for 7 days and then back to regular dose.  All questions answered.

## 2022-09-15 ENCOUNTER — Ambulatory Visit (INDEPENDENT_AMBULATORY_CARE_PROVIDER_SITE_OTHER): Payer: Medicare HMO | Admitting: Internal Medicine

## 2022-09-15 ENCOUNTER — Encounter: Payer: Self-pay | Admitting: Internal Medicine

## 2022-09-15 VITALS — BP 138/76 | HR 63 | Temp 99.3°F | Ht 61.0 in | Wt 208.0 lb

## 2022-09-15 DIAGNOSIS — I890 Lymphedema, not elsewhere classified: Secondary | ICD-10-CM | POA: Diagnosis not present

## 2022-09-15 DIAGNOSIS — E785 Hyperlipidemia, unspecified: Secondary | ICD-10-CM | POA: Diagnosis not present

## 2022-09-15 DIAGNOSIS — M5442 Lumbago with sciatica, left side: Secondary | ICD-10-CM

## 2022-09-15 DIAGNOSIS — G8929 Other chronic pain: Secondary | ICD-10-CM

## 2022-09-15 DIAGNOSIS — K7460 Unspecified cirrhosis of liver: Secondary | ICD-10-CM

## 2022-09-15 DIAGNOSIS — Z23 Encounter for immunization: Secondary | ICD-10-CM

## 2022-09-15 MED ORDER — MAGNESIUM OXIDE 400 MG PO TABS: 400.0000 mg | ORAL_TABLET | Freq: Two times a day (BID) | ORAL | 3 refills | Status: DC

## 2022-09-15 MED ORDER — CARVEDILOL 6.25 MG PO TABS: 6.2500 mg | ORAL_TABLET | Freq: Two times a day (BID) | ORAL | 3 refills | Status: DC

## 2022-09-15 MED ORDER — TRAMADOL HCL 50 MG PO TABS: 50.0000 mg | ORAL_TABLET | Freq: Four times a day (QID) | ORAL | 3 refills | Status: DC | PRN

## 2022-09-15 MED ORDER — LEVOTHYROXINE SODIUM 50 MCG PO TABS
50.0000 ug | ORAL_TABLET | Freq: Every day | ORAL | 3 refills | Status: DC
Start: 2022-09-15 — End: 2022-11-23

## 2022-09-15 MED ORDER — BLUE-EMU MAXIMUM PAIN RELIEF 10 % EX CREA: 1.0000 | TOPICAL_CREAM | CUTANEOUS | 3 refills | Status: DC | PRN

## 2022-09-15 MED ORDER — APIXABAN 5 MG PO TABS
5.0000 mg | ORAL_TABLET | Freq: Two times a day (BID) | ORAL | 3 refills | Status: DC
Start: 2022-09-15 — End: 2022-11-23

## 2022-09-15 MED ORDER — ALLOPURINOL 100 MG PO TABS
50.0000 mg | ORAL_TABLET | Freq: Every day | ORAL | 1 refills | Status: DC
Start: 2022-09-15 — End: 2022-11-23

## 2022-09-15 NOTE — Progress Notes (Signed)
Subjective:  Patient ID: Katelyn Jackson, female    DOB: 12-29-1945  Age: 76 y.o. MRN: [id number]  CC: Follow-up (3 month f/u)   HPI Porchea Charrier presents for low K, liver disease, pancytopenia, breast cancer  Outpatient Medications Prior to Visit  Medication Sig Dispense Refill   acetaminophen (TYLENOL) 325 MG tablet Take 2 tablets (650 mg total) by mouth every 6 (six) hours as needed for mild pain or moderate pain (or Fever >/= 101).     bisacodyl (DULCOLAX) 10 MG suppository Place 10 mg rectally daily as needed for moderate constipation.     Cholecalciferol (VITAMIN D3) 50 MCG (2000 UT) capsule Take 1 capsule (2,000 Units total) by mouth daily. 100 capsule 3   Cyanocobalamin (VITAMIN B-12 PO) Take 2,000 mcg by mouth daily.     diclofenac Sodium (VOLTAREN) 1 % GEL APPLY FOUR GRAMS FOUR TIMES DAILY AS NEEDED 300 g 5   docusate sodium (COLACE) 100 MG capsule Take 1 capsule (100 mg total) by mouth 2 (two) times daily. 90 capsule 3   lactulose (CHRONULAC) 10 GM/15ML solution TAKE 15 MILLILITERS BY MOUTH THREE TIMESDAILY 946 mL 3   lipase/protease/amylase (CREON) 12000-38000 units CPEP capsule Take 1 capsule (12,000 Units total) by mouth daily as needed (stomach problems). 270 capsule 3   omeprazole (PRILOSEC) 40 MG capsule TAKE ONE CAPSULE BY MOUTH DAILY 90 capsule 3   OVER THE COUNTER MEDICATION Apply 1 application topically daily as needed (apply to buttocks  as needed for irritaiton). Intensive Skin Care Therapy     polyethylene glycol (MIRALAX / GLYCOLAX) 17 g packet Take 17 g by mouth 2 (two) times daily.     polyvinyl alcohol (LIQUIFILM TEARS) 1.4 % ophthalmic solution Place 1 drop into both eyes as needed for dry eyes.     potassium chloride (KLOR-CON) 8 MEQ tablet Take 1 tablet (8 mEq total) by mouth daily. 90 tablet 3   pravastatin (PRAVACHOL) 20 MG tablet Take 1 tablet (20 mg total) by mouth daily at 6 PM. 90 tablet 3   rifaximin (XIFAXAN) 550 MG TABS tablet Take 1 tablet  (550 mg total) by mouth 2 (two) times daily. 60 tablet 11   sennosides-docusate sodium (SENOKOT-S) 8.6-50 MG tablet Take 1 tablet by mouth daily. 90 tablet 3   simethicone (MYLICON) 40 IO/9.6EX drops Take 0.3 mLs (20 mg total) by mouth 4 (four) times daily as needed for flatulence.     tamoxifen (NOLVADEX) 20 MG tablet Take 1 tablet (20 mg total) by mouth daily. Will take after finishing Exemestane 90 tablet 3   torsemide (DEMADEX) 100 MG tablet Take 1 tablet (100 mg total) by mouth 2 (two) times daily. 180 tablet 3   triamcinolone cream (KENALOG) 0.1 % Apply 1 Application topically 2 (two) times daily. 450 g 3   allopurinol (ZYLOPRIM) 100 MG tablet Take 0.5 tablets (50 mg total) by mouth daily. 90 tablet 1   apixaban (ELIQUIS) 5 MG TABS tablet Take 1 tablet (5 mg total) by mouth 2 (two) times daily. 180 tablet 3   carvedilol (COREG) 6.25 MG tablet Take 1 tablet (6.25 mg total) by mouth 2 (two) times daily. 180 tablet 3   levothyroxine (SYNTHROID) 50 MCG tablet Take 1 tablet (50 mcg total) by mouth daily before breakfast. Overdue for Annual appt must see provider for future refills 90 tablet 3   magnesium oxide (MAG-OX) 400 MG tablet Take 1 tablet (400 mg total) by mouth 2 (two) times daily. 180 tablet 3  traMADol (ULTRAM) 50 MG tablet Take 1 tablet (50 mg total) by mouth every 6 (six) hours as needed. 120 tablet 3   methocarbamol (ROBAXIN) 500 MG tablet Take 1 tablet (500 mg total) by mouth every 8 (eight) hours as needed for muscle spasms. (Patient not taking: Reported on 09/15/2022) 20 tablet 0   No facility-administered medications prior to visit.    ROS: Review of Systems  Constitutional:  Positive for fatigue. Negative for activity change, appetite change, chills and unexpected weight change.  HENT:  Negative for congestion, mouth sores and sinus pressure.   Eyes:  Negative for visual disturbance.  Respiratory:  Negative for cough and chest tightness.   Gastrointestinal:  Negative for  abdominal pain and nausea.  Genitourinary:  Negative for difficulty urinating, frequency and vaginal pain.  Musculoskeletal:  Positive for gait problem. Negative for back pain.  Skin:  Negative for pallor and rash.  Neurological:  Negative for dizziness, tremors, weakness, numbness and headaches.  Psychiatric/Behavioral:  Negative for confusion, sleep disturbance and suicidal ideas. The patient is nervous/anxious.     Objective:  BP 138/76 (BP Location: Left Arm)   Pulse 63   Temp 99.3 F (37.4 C) (Oral)   Ht '5\' 1"'$  (1.549 m)   Wt 208 lb (94.3 kg) Comment: per patient weigh this am  LMP  (LMP Unknown)   SpO2 97%   BMI 39.30 kg/m   BP Readings from Last 3 Encounters:  09/15/22 138/76  09/08/22 (!) 142/81  08/01/22 134/74    Wt Readings from Last 3 Encounters:  09/15/22 208 lb (94.3 kg)  07/25/22 205 lb (93 kg)  02/24/22 209 lb (94.8 kg)    Physical Exam Constitutional:      General: She is not in acute distress.    Appearance: She is well-developed. She is obese.  HENT:     Head: Normocephalic.     Right Ear: External ear normal.     Left Ear: External ear normal.     Nose: Nose normal.  Eyes:     General:        Right eye: No discharge.        Left eye: No discharge.     Conjunctiva/sclera: Conjunctivae normal.     Pupils: Pupils are equal, round, and reactive to light.  Neck:     Thyroid: No thyromegaly.     Vascular: No JVD.     Trachea: No tracheal deviation.  Cardiovascular:     Rate and Rhythm: Normal rate and regular rhythm.     Heart sounds: Normal heart sounds.  Pulmonary:     Effort: No respiratory distress.     Breath sounds: No stridor. No wheezing.  Abdominal:     General: Bowel sounds are normal. There is no distension.     Palpations: Abdomen is soft. There is no mass.     Tenderness: There is no abdominal tenderness. There is no guarding or rebound.  Musculoskeletal:        General: Tenderness present.     Cervical back: Normal range of  motion and neck supple. No rigidity.     Right lower leg: No edema.     Left lower leg: No edema.  Lymphadenopathy:     Cervical: No cervical adenopathy.  Skin:    Findings: No erythema or rash.  Neurological:     Mental Status: She is oriented to person, place, and time.     Cranial Nerves: No cranial nerve deficit.  Motor: No abnormal muscle tone.     Coordination: Coordination normal.     Deep Tendon Reflexes: Reflexes normal.  Psychiatric:        Behavior: Behavior normal.        Thought Content: Thought content normal.        Judgment: Judgment normal.     Lab Results  Component Value Date   WBC 3.4 (L) 09/08/2022   HGB 10.5 (L) 09/08/2022   HCT 32.7 (L) 09/08/2022   PLT 106 (L) 09/08/2022   GLUCOSE 85 09/08/2022   CHOL 163 11/25/2021   TRIG 66.0 11/25/2021   HDL 77.50 11/25/2021   LDLCALC 72 11/25/2021   ALT 11 09/08/2022   AST 15 09/08/2022   NA 139 09/08/2022   K 3.3 (L) 09/08/2022   CL 102 09/08/2022   CREATININE 1.59 (H) 09/08/2022   BUN 22 09/08/2022   CO2 31 09/08/2022   TSH 4.86 02/24/2022   INR 2.9 (H) 07/30/2021   HGBA1C 5.1 02/24/2022    No results found.  Assessment & Plan:   Problem List Items Addressed This Visit     Dyslipidemia - Primary    Lovastatin      Lymphedema    Re-start using a leg pump      Low back pain    Tramadol prn  Potential benefits of a long term opioids use as well as potential risks (i.e. addiction risk, apnea etc) and complications (i.e. Somnolence, constipation and others) were explained to the patient and were aknowledged.      Relevant Medications   traMADol (ULTRAM) 50 MG tablet   Liver cirrhosis (HCC)    Monitor CBC, LFTs Cont w/Xifaxan and Laculose         Meds ordered this encounter  Medications   allopurinol (ZYLOPRIM) 100 MG tablet    Sig: Take 0.5 tablets (50 mg total) by mouth daily.    Dispense:  90 tablet    Refill:  1   apixaban (ELIQUIS) 5 MG TABS tablet    Sig: Take 1 tablet (5  mg total) by mouth 2 (two) times daily.    Dispense:  180 tablet    Refill:  3   carvedilol (COREG) 6.25 MG tablet    Sig: Take 1 tablet (6.25 mg total) by mouth 2 (two) times daily.    Dispense:  180 tablet    Refill:  3   levothyroxine (SYNTHROID) 50 MCG tablet    Sig: Take 1 tablet (50 mcg total) by mouth daily before breakfast. Overdue for Annual appt must see provider for future refills    Dispense:  90 tablet    Refill:  3   magnesium oxide (MAG-OX) 400 MG tablet    Sig: Take 1 tablet (400 mg total) by mouth 2 (two) times daily.    Dispense:  180 tablet    Refill:  3   traMADol (ULTRAM) 50 MG tablet    Sig: Take 1 tablet (50 mg total) by mouth every 6 (six) hours as needed.    Dispense:  120 tablet    Refill:  3   trolamine salicylate (BLUE-EMU MAXIMUM PAIN RELIEF) 10 % cream    Sig: Apply 1 Application topically as needed for muscle pain.    Dispense:  226 g    Refill:  3      Follow-up: Return in about 3 months (around 12/16/2022) for a follow-up visit.  Walker Kehr, MD

## 2022-09-15 NOTE — Assessment & Plan Note (Signed)
Re-start using a leg pump

## 2022-09-15 NOTE — Assessment & Plan Note (Addendum)
Monitor CBC, LFTs Cont w/Xifaxan and Laculose

## 2022-09-15 NOTE — Addendum Note (Signed)
Addended by: Earnstine Regal on: 09/15/2022 08:41 AM   Modules accepted: Orders

## 2022-09-15 NOTE — Assessment & Plan Note (Signed)
Lovastatin 

## 2022-09-15 NOTE — Assessment & Plan Note (Signed)
Tramadol prn ° Potential benefits of a long term opioids use as well as potential risks (i.e. addiction risk, apnea etc) and complications (i.e. Somnolence, constipation and others) were explained to the patient and were aknowledged. ° ° °

## 2022-10-12 ENCOUNTER — Other Ambulatory Visit: Payer: Self-pay | Admitting: Internal Medicine

## 2022-11-02 ENCOUNTER — Ambulatory Visit: Payer: Medicare HMO | Admitting: Podiatry

## 2022-11-04 ENCOUNTER — Ambulatory Visit (INDEPENDENT_AMBULATORY_CARE_PROVIDER_SITE_OTHER): Payer: Medicare HMO | Admitting: Podiatry

## 2022-11-04 ENCOUNTER — Encounter: Payer: Self-pay | Admitting: Podiatry

## 2022-11-04 DIAGNOSIS — I890 Lymphedema, not elsewhere classified: Secondary | ICD-10-CM

## 2022-11-04 DIAGNOSIS — D689 Coagulation defect, unspecified: Secondary | ICD-10-CM | POA: Diagnosis not present

## 2022-11-04 DIAGNOSIS — L84 Corns and callosities: Secondary | ICD-10-CM

## 2022-11-04 DIAGNOSIS — B351 Tinea unguium: Secondary | ICD-10-CM

## 2022-11-04 NOTE — Progress Notes (Signed)
This patient returns to my office for at risk foot care.  This patient requires this care by a professional since this patient will be at risk due to having lymphedema and coagulation defect.  Patient is taking eliquis.  This patient is unable to cut nails herself since the patient cannot reach her nails.These nails are painful walking and wearing shoes.  This patient presents for at risk foot care today.  She presents to the office in a wheelchair with her daughter.  General Appearance  Alert, conversant and in no acute stress.  Vascular  Dorsalis pedis and posterior tibial  pulses are not palpable  due to lymphedema  bilaterally.  Capillary return is within normal limits  bilaterally. Temperature is within normal limits  bilaterally.  Neurologic  Senn-Weinstein monofilament wire test within normal limits  bilaterally. Muscle power within normal limits bilaterally.  Nails Thick disfigured discolored nails with subungual debris  from hallux to fifth toes bilaterally. No evidence of bacterial infection or drainage bilaterally.  Orthopedic  No limitations of motion  feet .  No crepitus or effusions noted.  No bony pathology or digital deformities noted.  Skin  normotropic skin with no porokeratosis noted bilaterally.  No signs of infections or ulcers noted.   Callus left foot.  Onychomycosis  Pain in right toes  Pain in left toes  Consent was obtained for treatment procedures.   Mechanical debridement of nails 1-5  bilaterally performed with a nail nipper.  Filed with dremel without incident.  Debride callus with  # 15 blade and dremel tool.   Return office visit   3 months                   Told patient to return for periodic foot care and evaluation due to potential at risk complications.   Gardiner Barefoot DPM

## 2022-11-23 ENCOUNTER — Other Ambulatory Visit: Payer: Self-pay | Admitting: Internal Medicine

## 2022-11-23 MED ORDER — ALLOPURINOL 100 MG PO TABS
50.0000 mg | ORAL_TABLET | Freq: Every day | ORAL | 1 refills | Status: DC
Start: 2022-11-23 — End: 2023-11-02

## 2022-11-23 MED ORDER — LEVOTHYROXINE SODIUM 50 MCG PO TABS
50.0000 ug | ORAL_TABLET | Freq: Every day | ORAL | 3 refills | Status: DC
Start: 2022-11-23 — End: 2023-11-02

## 2022-11-23 MED ORDER — APIXABAN 5 MG PO TABS: 5.0000 mg | ORAL_TABLET | Freq: Two times a day (BID) | ORAL | 3 refills | Status: DC

## 2022-11-23 MED ORDER — CARVEDILOL 6.25 MG PO TABS: 6.2500 mg | ORAL_TABLET | Freq: Two times a day (BID) | ORAL | 3 refills | Status: DC

## 2022-11-23 MED ORDER — DICLOFENAC SODIUM 1 % EX GEL
CUTANEOUS | 5 refills | Status: DC
Start: 2022-11-23 — End: 2022-12-22

## 2022-11-29 ENCOUNTER — Other Ambulatory Visit: Payer: Self-pay | Admitting: Internal Medicine

## 2022-12-02 ENCOUNTER — Encounter: Payer: Self-pay | Admitting: Podiatry

## 2022-12-16 DIAGNOSIS — I5022 Chronic systolic (congestive) heart failure: Secondary | ICD-10-CM | POA: Insufficient documentation

## 2022-12-16 DIAGNOSIS — I5032 Chronic diastolic (congestive) heart failure: Secondary | ICD-10-CM | POA: Diagnosis not present

## 2022-12-16 DIAGNOSIS — Z6841 Body Mass Index (BMI) 40.0 and over, adult: Secondary | ICD-10-CM | POA: Diagnosis not present

## 2022-12-16 DIAGNOSIS — Z950 Presence of cardiac pacemaker: Secondary | ICD-10-CM | POA: Diagnosis not present

## 2022-12-16 DIAGNOSIS — I272 Pulmonary hypertension, unspecified: Secondary | ICD-10-CM | POA: Diagnosis not present

## 2022-12-16 DIAGNOSIS — I129 Hypertensive chronic kidney disease with stage 1 through stage 4 chronic kidney disease, or unspecified chronic kidney disease: Secondary | ICD-10-CM | POA: Diagnosis not present

## 2022-12-16 DIAGNOSIS — N184 Chronic kidney disease, stage 4 (severe): Secondary | ICD-10-CM | POA: Diagnosis not present

## 2022-12-16 DIAGNOSIS — I4821 Permanent atrial fibrillation: Secondary | ICD-10-CM | POA: Diagnosis not present

## 2022-12-22 ENCOUNTER — Ambulatory Visit (INDEPENDENT_AMBULATORY_CARE_PROVIDER_SITE_OTHER): Payer: Medicare HMO | Admitting: Internal Medicine

## 2022-12-22 ENCOUNTER — Encounter: Payer: Self-pay | Admitting: Internal Medicine

## 2022-12-22 ENCOUNTER — Other Ambulatory Visit: Payer: Self-pay | Admitting: Internal Medicine

## 2022-12-22 VITALS — BP 136/76 | HR 60 | Temp 97.6°F | Ht 61.0 in | Wt 204.0 lb

## 2022-12-22 DIAGNOSIS — E039 Hypothyroidism, unspecified: Secondary | ICD-10-CM | POA: Diagnosis not present

## 2022-12-22 DIAGNOSIS — I10 Essential (primary) hypertension: Secondary | ICD-10-CM

## 2022-12-22 DIAGNOSIS — K7460 Unspecified cirrhosis of liver: Secondary | ICD-10-CM

## 2022-12-22 DIAGNOSIS — I890 Lymphedema, not elsewhere classified: Secondary | ICD-10-CM | POA: Diagnosis not present

## 2022-12-22 MED ORDER — DICLOFENAC SODIUM 1 % EX GEL
CUTANEOUS | 5 refills | Status: DC
Start: 2022-12-22 — End: 2023-06-09

## 2022-12-22 NOTE — Assessment & Plan Note (Signed)
On Coreg, Torsemide

## 2022-12-22 NOTE — Progress Notes (Signed)
Subjective:  Patient ID: Katelyn Jackson, female    DOB: August 30, 1946  Age: 77 y.o. MRN: [id number]  CC: Follow-up (3 month f/u)   HPI Katelyn Jackson presents for anticoagulation, lymphedema, LBP, liver cirrhosis  Outpatient Medications Prior to Visit  Medication Sig Dispense Refill   acetaminophen (TYLENOL) 325 MG tablet Take 2 tablets (650 mg total) by mouth every 6 (six) hours as needed for mild pain or moderate pain (or Fever >/= 101).     allopurinol (ZYLOPRIM) 100 MG tablet Take 0.5 tablets (50 mg total) by mouth daily. 90 tablet 1   apixaban (ELIQUIS) 5 MG TABS tablet Take 1 tablet (5 mg total) by mouth 2 (two) times daily. 180 tablet 3   bisacodyl (DULCOLAX) 10 MG suppository Place 10 mg rectally daily as needed for moderate constipation.     carvedilol (COREG) 6.25 MG tablet Take 1 tablet (6.25 mg total) by mouth 2 (two) times daily. 180 tablet 3   Cholecalciferol (VITAMIN D3) 50 MCG (2000 UT) capsule Take 1 capsule (2,000 Units total) by mouth daily. 100 capsule 3   Cyanocobalamin (VITAMIN B-12 PO) Take 2,000 mcg by mouth daily.     docusate sodium (COLACE) 100 MG capsule Take 1 capsule (100 mg total) by mouth 2 (two) times daily. 90 capsule 3   lactulose (CHRONULAC) 10 GM/15ML solution TAKE 15 MILLILITERS BY MOUTH THREE TIMESDAILY 946 mL 3   levothyroxine (SYNTHROID) 50 MCG tablet Take 1 tablet (50 mcg total) by mouth daily before breakfast. Overdue for Annual appt must see provider for future refills 90 tablet 3   lipase/protease/amylase (CREON) 12000-38000 units CPEP capsule Take 1 capsule (12,000 Units total) by mouth daily as needed (stomach problems). 270 capsule 3   magnesium oxide (MAG-OX) 400 MG tablet Take 1 tablet (400 mg total) by mouth 2 (two) times daily. 180 tablet 3   omeprazole (PRILOSEC) 40 MG capsule TAKE ONE CAPSULE BY MOUTH DAILY 90 capsule 3   OVER THE COUNTER MEDICATION Apply 1 application topically daily as needed (apply to buttocks  as needed for  irritaiton). Intensive Skin Care Therapy     polyethylene glycol (MIRALAX / GLYCOLAX) 17 g packet Take 17 g by mouth 2 (two) times daily.     polyvinyl alcohol (LIQUIFILM TEARS) 1.4 % ophthalmic solution Place 1 drop into both eyes as needed for dry eyes.     potassium chloride (KLOR-CON) 8 MEQ tablet TAKE ONE (1) TABLET BY MOUTH EVERY DAY 90 tablet 3   pravastatin (PRAVACHOL) 20 MG tablet Take 1 tablet (20 mg total) by mouth daily at 6 PM. 90 tablet 3   rifaximin (XIFAXAN) 550 MG TABS tablet Take 1 tablet (550 mg total) by mouth 2 (two) times daily. 60 tablet 11   sennosides-docusate sodium (SENOKOT-S) 8.6-50 MG tablet Take 1 tablet by mouth daily. 90 tablet 3   simethicone (MYLICON) 40 99991111 drops Take 0.3 mLs (20 mg total) by mouth 4 (four) times daily as needed for flatulence.     tamoxifen (NOLVADEX) 20 MG tablet Take 1 tablet (20 mg total) by mouth daily. Will take after finishing Exemestane 90 tablet 3   torsemide (DEMADEX) 100 MG tablet Take 1 tablet (100 mg total) by mouth 2 (two) times daily. 180 tablet 3   traMADol (ULTRAM) 50 MG tablet Take 1 tablet (50 mg total) by mouth every 6 (six) hours as needed. 120 tablet 3   triamcinolone cream (KENALOG) 0.1 % Apply 1 Application topically 2 (two) times daily. 450 g 3  trolamine salicylate (BLUE-EMU MAXIMUM PAIN RELIEF) 10 % cream Apply 1 Application topically as needed for muscle pain. 226 g 3   diclofenac Sodium (VOLTAREN) 1 % GEL APPLY FOUR GRAMS FOUR TIMES DAILY AS NEEDED 300 g 5   No facility-administered medications prior to visit.    ROS: Review of Systems  Constitutional:  Negative for activity change, appetite change, chills, fatigue and unexpected weight change.  HENT:  Negative for congestion, mouth sores and sinus pressure.   Eyes:  Negative for visual disturbance.  Respiratory:  Negative for cough and chest tightness.   Cardiovascular:  Positive for leg swelling.  Gastrointestinal:  Negative for abdominal pain and  nausea.  Genitourinary:  Negative for difficulty urinating, frequency and vaginal pain.  Musculoskeletal:  Positive for arthralgias, back pain and gait problem.  Skin:  Negative for pallor and rash.  Neurological:  Negative for dizziness, tremors, weakness, numbness and headaches.  Psychiatric/Behavioral:  Negative for confusion and sleep disturbance.     Objective:  BP 136/76 (BP Location: Left Arm, Patient Position: Sitting, Cuff Size: Large)   Pulse 60   Temp 97.6 F (36.4 C) (Temporal)   Ht 5' 1"$  (1.549 m)   Wt 204 lb (92.5 kg)   LMP  (LMP Unknown)   SpO2 99%   BMI 38.55 kg/m   BP Readings from Last 3 Encounters:  12/22/22 136/76  09/15/22 138/76  09/08/22 (!) 142/81    Wt Readings from Last 3 Encounters:  12/22/22 204 lb (92.5 kg)  09/15/22 208 lb (94.3 kg)  07/25/22 205 lb (93 kg)    Physical Exam Constitutional:      General: She is not in acute distress.    Appearance: She is well-developed. She is obese.  HENT:     Head: Normocephalic.     Right Ear: External ear normal.     Left Ear: External ear normal.     Nose: Nose normal.  Eyes:     General:        Right eye: No discharge.        Left eye: No discharge.     Conjunctiva/sclera: Conjunctivae normal.     Pupils: Pupils are equal, round, and reactive to light.  Neck:     Thyroid: No thyromegaly.     Vascular: No JVD.     Trachea: No tracheal deviation.  Cardiovascular:     Rate and Rhythm: Normal rate and regular rhythm.     Heart sounds: Normal heart sounds.  Pulmonary:     Effort: No respiratory distress.     Breath sounds: No stridor. No wheezing.  Abdominal:     General: Bowel sounds are normal. There is no distension.     Palpations: Abdomen is soft. There is no mass.     Tenderness: There is no abdominal tenderness. There is no guarding or rebound.  Musculoskeletal:        General: No tenderness.     Cervical back: Normal range of motion and neck supple. No rigidity.     Right lower  leg: Edema present.     Left lower leg: Edema present.  Lymphadenopathy:     Cervical: No cervical adenopathy.  Skin:    Findings: No erythema or rash.  Neurological:     Cranial Nerves: No cranial nerve deficit.     Motor: No abnormal muscle tone.     Coordination: Coordination normal.     Deep Tendon Reflexes: Reflexes normal.  Psychiatric:  Behavior: Behavior normal.        Thought Content: Thought content normal.        Judgment: Judgment normal.   No edema!  Lab Results  Component Value Date   WBC 3.4 (L) 09/08/2022   HGB 10.5 (L) 09/08/2022   HCT 32.7 (L) 09/08/2022   PLT 106 (L) 09/08/2022   GLUCOSE 85 09/08/2022   CHOL 163 11/25/2021   TRIG 66.0 11/25/2021   HDL 77.50 11/25/2021   LDLCALC 72 11/25/2021   ALT 11 09/08/2022   AST 15 09/08/2022   NA 139 09/08/2022   K 3.3 (L) 09/08/2022   CL 102 09/08/2022   CREATININE 1.59 (H) 09/08/2022   BUN 22 09/08/2022   CO2 31 09/08/2022   TSH 4.86 02/24/2022   INR 2.9 (H) 07/30/2021   HGBA1C 5.1 02/24/2022    No results found.  Assessment & Plan:   Problem List Items Addressed This Visit       Cardiovascular and Mediastinum   Essential hypertension - Primary (Chronic)    On Coreg, Torsemide        Digestive   Liver cirrhosis (HCC)    Cont w/Xifaxan and Laculose        Endocrine   Hypothyroidism    Monitor TSH        Other   Lymphedema    Better on Trolamine cream         Meds ordered this encounter  Medications   diclofenac Sodium (VOLTAREN) 1 % GEL    Sig: APPLY FOUR GRAMS FOUR TIMES DAILY AS NEEDED    Dispense:  300 g    Refill:  5      Follow-up: Return in about 3 months (around 03/22/2023) for a follow-up visit.  Walker Kehr, MD

## 2022-12-22 NOTE — Assessment & Plan Note (Signed)
Monitor TSH 

## 2022-12-22 NOTE — Assessment & Plan Note (Signed)
Cont w/Xifaxan and [facility]

## 2022-12-22 NOTE — Assessment & Plan Note (Signed)
Better on Trolamine cream

## 2022-12-29 ENCOUNTER — Telehealth: Payer: Self-pay | Admitting: Internal Medicine

## 2022-12-29 MED ORDER — TRIAMCINOLONE ACETONIDE 0.1 % EX CREA: TOPICAL_CREAM | Freq: Two times a day (BID) | CUTANEOUS | 0 refills | Status: DC

## 2022-12-29 NOTE — Telephone Encounter (Signed)
Sent refill to [place].Marland KitchenJohny Jackson

## 2022-12-29 NOTE — Telephone Encounter (Signed)
MEDICATION: triamcinolone cream (KENALOG) 0.1 %   PHARMACY:[facility] - [place], [place] - [address]   Comments: Patient's pharmacy sent a fax request 12/23/22 for a refill  **Let patient know to contact pharmacy at the end of the day to make sure medication is ready. **  ** Please notify patient to allow 48-72 hours to process**  **Encourage patient to contact the pharmacy for refills or they can request refills through [facility]**

## 2023-01-02 MED ORDER — TRIAMCINOLONE ACETONIDE 0.1 % EX CREA: TOPICAL_CREAM | Freq: Two times a day (BID) | CUTANEOUS | 0 refills | Status: DC

## 2023-01-02 NOTE — Addendum Note (Signed)
Addended by: Earnstine Regal on: 01/02/2023 08:31 AM   Modules accepted: Orders

## 2023-01-04 DIAGNOSIS — R600 Localized edema: Secondary | ICD-10-CM | POA: Diagnosis not present

## 2023-01-04 DIAGNOSIS — N1832 Chronic kidney disease, stage 3b: Secondary | ICD-10-CM | POA: Diagnosis not present

## 2023-01-04 DIAGNOSIS — I129 Hypertensive chronic kidney disease with stage 1 through stage 4 chronic kidney disease, or unspecified chronic kidney disease: Secondary | ICD-10-CM | POA: Diagnosis not present

## 2023-01-04 DIAGNOSIS — M109 Gout, unspecified: Secondary | ICD-10-CM | POA: Diagnosis not present

## 2023-01-04 DIAGNOSIS — Z6841 Body Mass Index (BMI) 40.0 and over, adult: Secondary | ICD-10-CM | POA: Diagnosis not present

## 2023-01-04 DIAGNOSIS — D649 Anemia, unspecified: Secondary | ICD-10-CM | POA: Diagnosis not present

## 2023-01-04 DIAGNOSIS — N184 Chronic kidney disease, stage 4 (severe): Secondary | ICD-10-CM | POA: Diagnosis not present

## 2023-01-05 DIAGNOSIS — Z45018 Encounter for adjustment and management of other part of cardiac pacemaker: Secondary | ICD-10-CM | POA: Diagnosis not present

## 2023-01-05 DIAGNOSIS — I4821 Permanent atrial fibrillation: Secondary | ICD-10-CM | POA: Diagnosis not present

## 2023-01-05 LAB — LAB REPORT - SCANNED: EGFR: 24

## 2023-02-01 ENCOUNTER — Other Ambulatory Visit: Payer: Self-pay | Admitting: Internal Medicine

## 2023-02-01 MED ORDER — TRIAMCINOLONE ACETONIDE 0.1 % EX CREA: TOPICAL_CREAM | Freq: Two times a day (BID) | CUTANEOUS | 3 refills | Status: DC

## 2023-02-03 ENCOUNTER — Ambulatory Visit: Payer: Medicare HMO | Admitting: Podiatry

## 2023-02-10 ENCOUNTER — Other Ambulatory Visit: Payer: Self-pay | Admitting: Internal Medicine

## 2023-03-08 ENCOUNTER — Ambulatory Visit (INDEPENDENT_AMBULATORY_CARE_PROVIDER_SITE_OTHER): Payer: Medicare HMO | Admitting: Podiatry

## 2023-03-08 ENCOUNTER — Encounter: Payer: Self-pay | Admitting: Podiatry

## 2023-03-08 DIAGNOSIS — I890 Lymphedema, not elsewhere classified: Secondary | ICD-10-CM

## 2023-03-08 DIAGNOSIS — B351 Tinea unguium: Secondary | ICD-10-CM

## 2023-03-08 DIAGNOSIS — L989 Disorder of the skin and subcutaneous tissue, unspecified: Secondary | ICD-10-CM | POA: Diagnosis not present

## 2023-03-08 DIAGNOSIS — D689 Coagulation defect, unspecified: Secondary | ICD-10-CM | POA: Diagnosis not present

## 2023-03-08 DIAGNOSIS — L84 Corns and callosities: Secondary | ICD-10-CM

## 2023-03-08 DIAGNOSIS — M79676 Pain in unspecified toe(s): Secondary | ICD-10-CM

## 2023-03-08 NOTE — Progress Notes (Signed)
This patient returns to my office for at risk foot care.  This patient requires this care by a professional since this patient will be at risk due to having lymphedema and coagulation defect.  Patient is taking eliquis.  This patient is unable to cut nails herself since the patient cannot reach her nails.These nails are painful walking and wearing shoes.  She also has painful skin lesion on the bottom of her left foot. This patient presents for at risk foot care today.  She presents to the office in a wheelchair with her son.  General Appearance  Alert, conversant and in no acute stress.  Vascular  Dorsalis pedis and posterior tibial  pulses are not palpable  due to lymphedema  bilaterally.  Capillary return is within normal limits  bilaterally. Temperature is within normal limits  bilaterally.  Neurologic  Senn-Weinstein monofilament wire test within normal limits  bilaterally. Muscle power within normal limits bilaterally.  Nails Thick disfigured discolored nails with subungual debris  from hallux to fifth toes bilaterally. No evidence of bacterial infection or drainage bilaterally.  Orthopedic  No limitations of motion  feet .  No crepitus or effusions noted.  No bony pathology or digital deformities noted.  Skin  normotropic skin with no porokeratosis noted bilaterally.  No signs of infections or ulcers noted.  Plantar  callus left foot.  Onychomycosis  Pain in right toes  Pain in left toes  Skin lesion left foot.  Consent was obtained for treatment procedures.   Mechanical debridement of nails 1-5  bilaterally performed with a nail nipper.  Filed with dremel without incident.  Debride callus with  # 15 blade and dremel tool. DSD applied to skin lesion.   Return office visit   3 months                   Told patient to return for periodic foot care and evaluation due to potential at risk complications.   Helane Gunther DPM

## 2023-03-22 ENCOUNTER — Ambulatory Visit: Payer: Medicare HMO | Admitting: Internal Medicine

## 2023-04-17 ENCOUNTER — Ambulatory Visit (INDEPENDENT_AMBULATORY_CARE_PROVIDER_SITE_OTHER): Payer: Medicare HMO | Admitting: Internal Medicine

## 2023-04-17 ENCOUNTER — Encounter: Payer: Self-pay | Admitting: Internal Medicine

## 2023-04-17 VITALS — BP 114/78 | HR 67 | Temp 99.7°F

## 2023-04-17 DIAGNOSIS — I10 Essential (primary) hypertension: Secondary | ICD-10-CM | POA: Diagnosis not present

## 2023-04-17 DIAGNOSIS — N184 Chronic kidney disease, stage 4 (severe): Secondary | ICD-10-CM | POA: Diagnosis not present

## 2023-04-17 DIAGNOSIS — L89202 Pressure ulcer of unspecified hip, stage 2: Secondary | ICD-10-CM | POA: Diagnosis not present

## 2023-04-17 DIAGNOSIS — E039 Hypothyroidism, unspecified: Secondary | ICD-10-CM | POA: Diagnosis not present

## 2023-04-17 DIAGNOSIS — L304 Erythema intertrigo: Secondary | ICD-10-CM | POA: Diagnosis not present

## 2023-04-17 DIAGNOSIS — I890 Lymphedema, not elsewhere classified: Secondary | ICD-10-CM | POA: Diagnosis not present

## 2023-04-17 MED ORDER — CLOTRIMAZOLE-BETAMETHASONE 1-0.05 % EX CREA
1.0000 | TOPICAL_CREAM | Freq: Two times a day (BID) | CUTANEOUS | 1 refills | Status: DC
Start: 2023-04-17 — End: 2023-05-19

## 2023-04-17 MED ORDER — DUODERM CGF DRESSING EX MISC: 1.0000 | CUTANEOUS | 3 refills | Status: DC

## 2023-04-17 NOTE — Progress Notes (Signed)
Subjective:  Patient ID: Katelyn Jackson, female    DOB: March 01, 1946  Age: 77 y.o. MRN: [id number]  CC: Medical Management of Chronic Issues (Right middle thigh with red bumps on )   HPI Memory Bannon presents for sores on B thighs C/o leg swelling She is here with her son    Outpatient Medications Prior to Visit  Medication Sig Dispense Refill   acetaminophen (TYLENOL) 325 MG tablet Take 2 tablets (650 mg total) by mouth every 6 (six) hours as needed for mild pain or moderate pain (or Fever >/= 101).     allopurinol (ZYLOPRIM) 100 MG tablet Take 0.5 tablets (50 mg total) by mouth daily. 90 tablet 1   apixaban (ELIQUIS) 5 MG TABS tablet Take 1 tablet (5 mg total) by mouth 2 (two) times daily. 180 tablet 3   bisacodyl (DULCOLAX) 10 MG suppository Place 10 mg rectally daily as needed for moderate constipation.     carvedilol (COREG) 6.25 MG tablet Take 1 tablet (6.25 mg total) by mouth 2 (two) times daily. 180 tablet 3   Cholecalciferol (VITAMIN D3) 50 MCG (2000 UT) capsule Take 1 capsule (2,000 Units total) by mouth daily. 100 capsule 3   Cyanocobalamin (VITAMIN B-12 PO) Take 2,000 mcg by mouth daily.     diclofenac Sodium (VOLTAREN) 1 % GEL APPLY FOUR GRAMS FOUR TIMES DAILY AS NEEDED 300 g 5   docusate sodium (COLACE) 100 MG capsule Take 1 capsule (100 mg total) by mouth 2 (two) times daily. 90 capsule 3   lactulose (CHRONULAC) 10 GM/15ML solution TAKE 15 MILLILITERS BY MOUTH THREE TIMESDAILY 946 mL 3   levothyroxine (SYNTHROID) 50 MCG tablet Take 1 tablet (50 mcg total) by mouth daily before breakfast. Overdue for Annual appt must see provider for future refills 90 tablet 3   lipase/protease/amylase (CREON) 12000-38000 units CPEP capsule Take 1 capsule (12,000 Units total) by mouth daily as needed (stomach problems). 270 capsule 3   magnesium oxide (MAG-OX) 400 MG tablet Take 1 tablet (400 mg total) by mouth 2 (two) times daily. 180 tablet 3   omeprazole (PRILOSEC) 40 MG capsule  TAKE ONE CAPSULE BY MOUTH DAILY 90 capsule 3   OVER THE COUNTER MEDICATION Apply 1 application topically daily as needed (apply to buttocks  as needed for irritaiton). Intensive Skin Care Therapy     polyethylene glycol (MIRALAX / GLYCOLAX) 17 g packet Take 17 g by mouth 2 (two) times daily.     polyvinyl alcohol (LIQUIFILM TEARS) 1.4 % ophthalmic solution Place 1 drop into both eyes as needed for dry eyes.     potassium chloride (KLOR-CON) 8 MEQ tablet TAKE ONE (1) TABLET BY MOUTH EVERY DAY 90 tablet 3   pravastatin (PRAVACHOL) 20 MG tablet Take 1 tablet (20 mg total) by mouth daily at 6 PM. 90 tablet 3   rifaximin (XIFAXAN) 550 MG TABS tablet Take 1 tablet (550 mg total) by mouth 2 (two) times daily. 60 tablet 11   sennosides-docusate sodium (SENOKOT-S) 8.6-50 MG tablet Take 1 tablet by mouth daily. 90 tablet 3   simethicone (MYLICON) 40 MG/0.6ML drops Take 0.3 mLs (20 mg total) by mouth 4 (four) times daily as needed for flatulence.     tamoxifen (NOLVADEX) 20 MG tablet Take 1 tablet (20 mg total) by mouth daily. Will take after finishing Exemestane 90 tablet 3   torsemide (DEMADEX) 100 MG tablet Take 1 tablet (100 mg total) by mouth 2 (two) times daily. 180 tablet 3   traMADol (ULTRAM)  50 MG tablet TAKE ONE (1) TABLET BY MOUTH EVERY 6 HOURS AS NEEDED 120 tablet 3   triamcinolone cream (KENALOG) 0.1 % Apply topically 2 (two) times daily. 450 g 3   trolamine salicylate (BLUE-EMU MAXIMUM PAIN RELIEF) 10 % cream Apply 1 Application topically as needed for muscle pain. 226 g 3   No facility-administered medications prior to visit.    ROS: Review of Systems  Constitutional:  Positive for fatigue. Negative for activity change, appetite change, chills and unexpected weight change.  HENT:  Negative for congestion, mouth sores and sinus pressure.   Eyes:  Negative for visual disturbance.  Respiratory:  Negative for cough and chest tightness.   Cardiovascular:  Positive for leg swelling.   Gastrointestinal:  Negative for abdominal pain and nausea.  Genitourinary:  Negative for difficulty urinating, frequency and vaginal pain.  Musculoskeletal:  Positive for arthralgias. Negative for back pain and gait problem.  Skin:  Positive for color change and wound. Negative for pallor and rash.  Neurological:  Positive for weakness. Negative for dizziness, tremors, numbness and headaches.  Psychiatric/Behavioral:  Negative for confusion, sleep disturbance and suicidal ideas. The patient is nervous/anxious.     Objective:  BP 114/78   Pulse 67   Temp 99.7 F (37.6 C) (Temporal)   LMP  (LMP Unknown)   SpO2 92%   BP Readings from Last 3 Encounters:  04/17/23 114/78  12/22/22 136/76  09/15/22 138/76    Wt Readings from Last 3 Encounters:  12/22/22 204 lb (92.5 kg)  09/15/22 208 lb (94.3 kg)  07/25/22 205 lb (93 kg)    Physical Exam Constitutional:      General: She is not in acute distress.    Appearance: She is well-developed. She is obese.  HENT:     Head: Normocephalic.     Right Ear: External ear normal.     Left Ear: External ear normal.     Nose: Nose normal.  Eyes:     General:        Right eye: No discharge.        Left eye: No discharge.     Conjunctiva/sclera: Conjunctivae normal.     Pupils: Pupils are equal, round, and reactive to light.  Neck:     Thyroid: No thyromegaly.     Vascular: No JVD.     Trachea: No tracheal deviation.  Cardiovascular:     Rate and Rhythm: Normal rate and regular rhythm.     Heart sounds: Normal heart sounds.  Pulmonary:     Effort: No respiratory distress.     Breath sounds: No stridor. No wheezing.  Abdominal:     General: Bowel sounds are normal. There is no distension.     Palpations: Abdomen is soft. There is no mass.     Tenderness: There is no abdominal tenderness. There is no guarding or rebound.  Musculoskeletal:        General: No tenderness.     Cervical back: Normal range of motion and neck supple. No  rigidity.  Lymphadenopathy:     Cervical: No cervical adenopathy.  Skin:    Findings: Lesion and rash present. No erythema.  Neurological:     Mental Status: She is oriented to person, place, and time.     Cranial Nerves: No cranial nerve deficit.     Motor: Weakness present. No abnormal muscle tone.     Coordination: Coordination abnormal.     Gait: Gait abnormal.     Deep  Tendon Reflexes: Reflexes normal.  Psychiatric:        Behavior: Behavior normal.        Thought Content: Thought content normal.        Judgment: Judgment normal.    B post thighs pressure sores Intertrigo - front thighs  Edema 4+ B lymphedema up to the mid thighs  In a wheelchair The patient requires assistance of 2 people to stand up  Lab Results  Component Value Date   WBC 3.4 (L) 09/08/2022   HGB 10.5 (L) 09/08/2022   HCT 32.7 (L) 09/08/2022   PLT 106 (L) 09/08/2022   GLUCOSE 85 09/08/2022   CHOL 163 11/25/2021   TRIG 66.0 11/25/2021   HDL 77.50 11/25/2021   LDLCALC 72 11/25/2021   ALT 11 09/08/2022   AST 15 09/08/2022   NA 139 09/08/2022   K 3.3 (L) 09/08/2022   CL 102 09/08/2022   CREATININE 1.59 (H) 09/08/2022   BUN 22 09/08/2022   CO2 31 09/08/2022   TSH 4.86 02/24/2022   INR 2.9 (H) 07/30/2021   HGBA1C 5.1 02/24/2022    No results found.  Assessment & Plan:   Problem List Items Addressed This Visit     Essential hypertension (Chronic)   Relevant Orders   Ambulatory referral to Home Health   Lymphedema    Chronic.  Start using leg pump again      Morbid obesity (HCC)    Continue to improve diet Stay away from salt      Hypothyroidism    Monitor TSH      Pressure injury of skin - Primary    New.  DuoDERM prescription was issued for pressure sores Home care referral to [facility] Change position frequently      Relevant Orders   Ambulatory referral to Home Health   CKD (chronic kidney disease) stage 4, GFR 15-29 ml/min (HCC)    Continue to hydrate well       Relevant Orders   Ambulatory referral to Home Health   Intertrigo    Front legs Lotrisone bid prescribed         Meds ordered this encounter  Medications   Control Gel Formula Dressing (DUODERM CGF DRESSING) MISC    Sig: Apply 1 each topically once a week. On both thighs    Dispense:  40 each    Refill:  3   clotrimazole-betamethasone (LOTRISONE) cream    Sig: Apply 1 Application topically 2 (two) times daily.    Dispense:  45 g    Refill:  1      Follow-up: Return in about 6 weeks (around 05/29/2023) for a follow-up visit.  Sonda Primes, MD

## 2023-04-17 NOTE — Assessment & Plan Note (Signed)
Front legs Lotrisone bid

## 2023-04-18 NOTE — Assessment & Plan Note (Signed)
Continue to hydrate well °

## 2023-04-18 NOTE — Assessment & Plan Note (Signed)
New.  DuoDERM prescription was issued for pressure sores Home care referral to [facility] Change position frequently

## 2023-04-18 NOTE — Assessment & Plan Note (Signed)
Chronic.  Start using leg pump again

## 2023-04-18 NOTE — Assessment & Plan Note (Signed)
Continue to improve diet Stay away from salt

## 2023-04-18 NOTE — Assessment & Plan Note (Signed)
Monitor TSH 

## 2023-04-27 DIAGNOSIS — Z45010 Encounter for checking and testing of cardiac pacemaker pulse generator [battery]: Secondary | ICD-10-CM | POA: Diagnosis not present

## 2023-05-02 DIAGNOSIS — M25522 Pain in left elbow: Secondary | ICD-10-CM | POA: Diagnosis not present

## 2023-05-02 DIAGNOSIS — M1812 Unilateral primary osteoarthritis of first carpometacarpal joint, left hand: Secondary | ICD-10-CM | POA: Diagnosis not present

## 2023-05-09 ENCOUNTER — Other Ambulatory Visit: Payer: Self-pay | Admitting: Internal Medicine

## 2023-05-09 MED ORDER — TRAMADOL HCL 50 MG PO TABS: ORAL_TABLET | ORAL | 3 refills | Status: DC

## 2023-05-09 NOTE — Progress Notes (Signed)
Rx refill

## 2023-05-10 DIAGNOSIS — M25512 Pain in left shoulder: Secondary | ICD-10-CM | POA: Diagnosis not present

## 2023-05-10 DIAGNOSIS — M25562 Pain in left knee: Secondary | ICD-10-CM | POA: Diagnosis not present

## 2023-05-17 ENCOUNTER — Telehealth: Payer: Self-pay | Admitting: Internal Medicine

## 2023-05-17 ENCOUNTER — Emergency Department (HOSPITAL_COMMUNITY): Payer: Medicare HMO

## 2023-05-17 ENCOUNTER — Inpatient Hospital Stay (HOSPITAL_COMMUNITY)
Admission: EM | Admit: 2023-05-17 | Discharge: 2023-05-24 | DRG: 563 | Disposition: A | Discharge status: Skilled Nursing Facility | Payer: Medicare HMO | Attending: Student | Admitting: Student

## 2023-05-17 DIAGNOSIS — S42002A Fracture of unspecified part of left clavicle, initial encounter for closed fracture: Secondary | ICD-10-CM

## 2023-05-17 DIAGNOSIS — S42009A Fracture of unspecified part of unspecified clavicle, initial encounter for closed fracture: Secondary | ICD-10-CM | POA: Diagnosis present

## 2023-05-17 DIAGNOSIS — M4312 Spondylolisthesis, cervical region: Secondary | ICD-10-CM | POA: Diagnosis not present

## 2023-05-17 DIAGNOSIS — W19XXXA Unspecified fall, initial encounter: Secondary | ICD-10-CM | POA: Diagnosis not present

## 2023-05-17 DIAGNOSIS — Z4789 Encounter for other orthopedic aftercare: Secondary | ICD-10-CM | POA: Diagnosis not present

## 2023-05-17 DIAGNOSIS — E669 Obesity, unspecified: Secondary | ICD-10-CM | POA: Diagnosis present

## 2023-05-17 DIAGNOSIS — S82012A Displaced osteochondral fracture of left patella, initial encounter for closed fracture: Principal | ICD-10-CM | POA: Diagnosis present

## 2023-05-17 DIAGNOSIS — I482 Chronic atrial fibrillation, unspecified: Secondary | ICD-10-CM | POA: Diagnosis not present

## 2023-05-17 DIAGNOSIS — R001 Bradycardia, unspecified: Secondary | ICD-10-CM | POA: Diagnosis present

## 2023-05-17 DIAGNOSIS — S82002A Unspecified fracture of left patella, initial encounter for closed fracture: Secondary | ICD-10-CM | POA: Diagnosis not present

## 2023-05-17 DIAGNOSIS — S82022A Displaced longitudinal fracture of left patella, initial encounter for closed fracture: Secondary | ICD-10-CM | POA: Diagnosis not present

## 2023-05-17 DIAGNOSIS — Z923 Personal history of irradiation: Secondary | ICD-10-CM | POA: Diagnosis not present

## 2023-05-17 DIAGNOSIS — I272 Pulmonary hypertension, unspecified: Secondary | ICD-10-CM | POA: Diagnosis not present

## 2023-05-17 DIAGNOSIS — W010XXA Fall on same level from slipping, tripping and stumbling without subsequent striking against object, initial encounter: Secondary | ICD-10-CM | POA: Diagnosis not present

## 2023-05-17 DIAGNOSIS — R410 Disorientation, unspecified: Secondary | ICD-10-CM | POA: Diagnosis present

## 2023-05-17 DIAGNOSIS — R42 Dizziness and giddiness: Secondary | ICD-10-CM | POA: Diagnosis present

## 2023-05-17 DIAGNOSIS — S42022A Displaced fracture of shaft of left clavicle, initial encounter for closed fracture: Secondary | ICD-10-CM | POA: Diagnosis not present

## 2023-05-17 DIAGNOSIS — S42032A Displaced fracture of lateral end of left clavicle, initial encounter for closed fracture: Secondary | ICD-10-CM | POA: Diagnosis present

## 2023-05-17 DIAGNOSIS — Z853 Personal history of malignant neoplasm of breast: Secondary | ICD-10-CM

## 2023-05-17 DIAGNOSIS — R8290 Unspecified abnormal findings in urine: Secondary | ICD-10-CM | POA: Diagnosis not present

## 2023-05-17 DIAGNOSIS — I130 Hypertensive heart and chronic kidney disease with heart failure and stage 1 through stage 4 chronic kidney disease, or unspecified chronic kidney disease: Secondary | ICD-10-CM | POA: Diagnosis not present

## 2023-05-17 DIAGNOSIS — N1832 Chronic kidney disease, stage 3b: Secondary | ICD-10-CM | POA: Diagnosis present

## 2023-05-17 DIAGNOSIS — Z8249 Family history of ischemic heart disease and other diseases of the circulatory system: Secondary | ICD-10-CM

## 2023-05-17 DIAGNOSIS — Z743 Need for continuous supervision: Secondary | ICD-10-CM | POA: Diagnosis not present

## 2023-05-17 DIAGNOSIS — K219 Gastro-esophageal reflux disease without esophagitis: Secondary | ICD-10-CM | POA: Diagnosis present

## 2023-05-17 DIAGNOSIS — N183 Chronic kidney disease, stage 3 unspecified: Secondary | ICD-10-CM | POA: Diagnosis present

## 2023-05-17 DIAGNOSIS — Z950 Presence of cardiac pacemaker: Secondary | ICD-10-CM

## 2023-05-17 DIAGNOSIS — L89892 Pressure ulcer of other site, stage 2: Secondary | ICD-10-CM | POA: Diagnosis not present

## 2023-05-17 DIAGNOSIS — Z79890 Hormone replacement therapy (postmenopausal): Secondary | ICD-10-CM

## 2023-05-17 DIAGNOSIS — I6782 Cerebral ischemia: Secondary | ICD-10-CM | POA: Diagnosis not present

## 2023-05-17 DIAGNOSIS — E039 Hypothyroidism, unspecified: Secondary | ICD-10-CM | POA: Diagnosis present

## 2023-05-17 DIAGNOSIS — N390 Urinary tract infection, site not specified: Secondary | ICD-10-CM | POA: Diagnosis present

## 2023-05-17 DIAGNOSIS — S0990XD Unspecified injury of head, subsequent encounter: Secondary | ICD-10-CM | POA: Diagnosis not present

## 2023-05-17 DIAGNOSIS — M109 Gout, unspecified: Secondary | ICD-10-CM | POA: Diagnosis present

## 2023-05-17 DIAGNOSIS — Z79810 Long term (current) use of selective estrogen receptor modulators (SERMs): Secondary | ICD-10-CM

## 2023-05-17 DIAGNOSIS — I890 Lymphedema, not elsewhere classified: Secondary | ICD-10-CM | POA: Diagnosis present

## 2023-05-17 DIAGNOSIS — I10 Essential (primary) hypertension: Secondary | ICD-10-CM | POA: Diagnosis present

## 2023-05-17 DIAGNOSIS — Z79899 Other long term (current) drug therapy: Secondary | ICD-10-CM

## 2023-05-17 DIAGNOSIS — R531 Weakness: Secondary | ICD-10-CM | POA: Diagnosis not present

## 2023-05-17 DIAGNOSIS — S42033A Displaced fracture of lateral end of unspecified clavicle, initial encounter for closed fracture: Secondary | ICD-10-CM | POA: Diagnosis not present

## 2023-05-17 DIAGNOSIS — W1811XA Fall from or off toilet without subsequent striking against object, initial encounter: Secondary | ICD-10-CM | POA: Diagnosis present

## 2023-05-17 DIAGNOSIS — S199XXA Unspecified injury of neck, initial encounter: Secondary | ICD-10-CM | POA: Diagnosis not present

## 2023-05-17 DIAGNOSIS — S0990XA Unspecified injury of head, initial encounter: Secondary | ICD-10-CM

## 2023-05-17 DIAGNOSIS — K7469 Other cirrhosis of liver: Secondary | ICD-10-CM | POA: Diagnosis present

## 2023-05-17 DIAGNOSIS — S82092A Other fracture of left patella, initial encounter for closed fracture: Secondary | ICD-10-CM | POA: Diagnosis present

## 2023-05-17 DIAGNOSIS — Z6838 Body mass index (BMI) 38.0-38.9, adult: Secondary | ICD-10-CM | POA: Diagnosis not present

## 2023-05-17 DIAGNOSIS — I959 Hypotension, unspecified: Secondary | ICD-10-CM | POA: Diagnosis not present

## 2023-05-17 DIAGNOSIS — I509 Heart failure, unspecified: Secondary | ICD-10-CM | POA: Diagnosis not present

## 2023-05-17 DIAGNOSIS — Z7901 Long term (current) use of anticoagulants: Secondary | ICD-10-CM

## 2023-05-17 DIAGNOSIS — R600 Localized edema: Secondary | ICD-10-CM | POA: Diagnosis not present

## 2023-05-17 DIAGNOSIS — Z96611 Presence of right artificial shoulder joint: Secondary | ICD-10-CM | POA: Diagnosis present

## 2023-05-17 DIAGNOSIS — R000 Tachycardia, unspecified: Secondary | ICD-10-CM | POA: Diagnosis not present

## 2023-05-17 DIAGNOSIS — Z993 Dependence on wheelchair: Secondary | ICD-10-CM

## 2023-05-17 DIAGNOSIS — S82009A Unspecified fracture of unspecified patella, initial encounter for closed fracture: Secondary | ICD-10-CM | POA: Diagnosis present

## 2023-05-17 DIAGNOSIS — Y92012 Bathroom of single-family (private) house as the place of occurrence of the external cause: Secondary | ICD-10-CM | POA: Diagnosis not present

## 2023-05-17 DIAGNOSIS — E785 Hyperlipidemia, unspecified: Secondary | ICD-10-CM | POA: Diagnosis present

## 2023-05-17 DIAGNOSIS — M5030 Other cervical disc degeneration, unspecified cervical region: Secondary | ICD-10-CM | POA: Diagnosis not present

## 2023-05-17 DIAGNOSIS — S42002D Fracture of unspecified part of left clavicle, subsequent encounter for fracture with routine healing: Secondary | ICD-10-CM | POA: Diagnosis not present

## 2023-05-17 DIAGNOSIS — M47812 Spondylosis without myelopathy or radiculopathy, cervical region: Secondary | ICD-10-CM | POA: Diagnosis not present

## 2023-05-17 DIAGNOSIS — I129 Hypertensive chronic kidney disease with stage 1 through stage 4 chronic kidney disease, or unspecified chronic kidney disease: Secondary | ICD-10-CM | POA: Diagnosis present

## 2023-05-17 DIAGNOSIS — M25462 Effusion, left knee: Secondary | ICD-10-CM | POA: Diagnosis not present

## 2023-05-17 DIAGNOSIS — Z880 Allergy status to penicillin: Secondary | ICD-10-CM

## 2023-05-17 DIAGNOSIS — I4821 Permanent atrial fibrillation: Secondary | ICD-10-CM | POA: Diagnosis present

## 2023-05-17 DIAGNOSIS — Z823 Family history of stroke: Secondary | ICD-10-CM

## 2023-05-17 DIAGNOSIS — N184 Chronic kidney disease, stage 4 (severe): Secondary | ICD-10-CM | POA: Diagnosis not present

## 2023-05-17 DIAGNOSIS — Z888 Allergy status to other drugs, medicaments and biological substances status: Secondary | ICD-10-CM

## 2023-05-17 DIAGNOSIS — G8911 Acute pain due to trauma: Secondary | ICD-10-CM | POA: Diagnosis not present

## 2023-05-17 DIAGNOSIS — S82042A Displaced comminuted fracture of left patella, initial encounter for closed fracture: Secondary | ICD-10-CM | POA: Diagnosis not present

## 2023-05-17 DIAGNOSIS — M1712 Unilateral primary osteoarthritis, left knee: Secondary | ICD-10-CM | POA: Diagnosis not present

## 2023-05-17 DIAGNOSIS — Z603 Acculturation difficulty: Secondary | ICD-10-CM | POA: Diagnosis present

## 2023-05-17 DIAGNOSIS — I5032 Chronic diastolic (congestive) heart failure: Secondary | ICD-10-CM | POA: Diagnosis not present

## 2023-05-17 DIAGNOSIS — R2681 Unsteadiness on feet: Secondary | ICD-10-CM | POA: Diagnosis not present

## 2023-05-17 DIAGNOSIS — L89152 Pressure ulcer of sacral region, stage 2: Secondary | ICD-10-CM | POA: Diagnosis present

## 2023-05-17 DIAGNOSIS — Z48812 Encounter for surgical aftercare following surgery on the circulatory system: Secondary | ICD-10-CM | POA: Diagnosis not present

## 2023-05-17 DIAGNOSIS — R296 Repeated falls: Secondary | ICD-10-CM | POA: Diagnosis not present

## 2023-05-17 DIAGNOSIS — S82002D Unspecified fracture of left patella, subsequent encounter for closed fracture with routine healing: Secondary | ICD-10-CM | POA: Diagnosis not present

## 2023-05-17 DIAGNOSIS — I447 Left bundle-branch block, unspecified: Secondary | ICD-10-CM | POA: Diagnosis present

## 2023-05-17 DIAGNOSIS — K7460 Unspecified cirrhosis of liver: Secondary | ICD-10-CM | POA: Diagnosis not present

## 2023-05-17 DIAGNOSIS — I6523 Occlusion and stenosis of bilateral carotid arteries: Secondary | ICD-10-CM | POA: Diagnosis not present

## 2023-05-17 DIAGNOSIS — M25512 Pain in left shoulder: Secondary | ICD-10-CM | POA: Diagnosis not present

## 2023-05-17 DIAGNOSIS — S82042D Displaced comminuted fracture of left patella, subsequent encounter for closed fracture with routine healing: Secondary | ICD-10-CM | POA: Diagnosis not present

## 2023-05-17 DIAGNOSIS — Z825 Family history of asthma and other chronic lower respiratory diseases: Secondary | ICD-10-CM

## 2023-05-17 DIAGNOSIS — M25552 Pain in left hip: Secondary | ICD-10-CM | POA: Diagnosis not present

## 2023-05-17 DIAGNOSIS — M1612 Unilateral primary osteoarthritis, left hip: Secondary | ICD-10-CM | POA: Diagnosis not present

## 2023-05-17 DIAGNOSIS — M19012 Primary osteoarthritis, left shoulder: Secondary | ICD-10-CM | POA: Diagnosis not present

## 2023-05-17 DIAGNOSIS — I517 Cardiomegaly: Secondary | ICD-10-CM | POA: Diagnosis not present

## 2023-05-17 DIAGNOSIS — I4891 Unspecified atrial fibrillation: Secondary | ICD-10-CM | POA: Diagnosis not present

## 2023-05-17 DIAGNOSIS — S4292XA Fracture of left shoulder girdle, part unspecified, initial encounter for closed fracture: Secondary | ICD-10-CM | POA: Diagnosis not present

## 2023-05-17 LAB — COMPREHENSIVE METABOLIC PANEL
ALT: 19 U/L (ref 0–44)
AST: 18 U/L (ref 15–41)
Albumin: 3.3 g/dL — ABNORMAL LOW (ref 3.5–5.0)
Alkaline Phosphatase: 148 U/L — ABNORMAL HIGH (ref 38–126)
Anion gap: 9 (ref 5–15)
BUN: 25 mg/dL — ABNORMAL HIGH (ref 8–23)
CO2: 24 mmol/L (ref 22–32)
Calcium: 9.8 mg/dL (ref 8.9–10.3)
Chloride: 106 mmol/L (ref 98–111)
Creatinine, Ser: 1.48 mg/dL — ABNORMAL HIGH (ref 0.44–1.00)
GFR, Estimated: 36 mL/min — ABNORMAL LOW (ref >60)
Glucose, Bld: 115 mg/dL — ABNORMAL HIGH (ref 70–99)
Potassium: 3.5 mmol/L (ref 3.5–5.1)
Sodium: 139 mmol/L (ref 135–145)
Total Bilirubin: 0.8 mg/dL (ref 0.3–1.2)
Total Protein: 6.7 g/dL (ref 6.5–8.1)

## 2023-05-17 LAB — I-STAT CHEM 8, ED
BUN: 27 mg/dL — ABNORMAL HIGH (ref 8–23)
Calcium, Ion: 1.16 mmol/L (ref 1.15–1.40)
Chloride: 107 mmol/L (ref 98–111)
Creatinine, Ser: 1.4 mg/dL — ABNORMAL HIGH (ref 0.44–1.00)
Glucose, Bld: 110 mg/dL — ABNORMAL HIGH (ref 70–99)
HCT: 37 % (ref 36.0–46.0)
Hemoglobin: 12.6 g/dL (ref 12.0–15.0)
Potassium: 3.6 mmol/L (ref 3.5–5.1)
Sodium: 139 mmol/L (ref 135–145)
TCO2: 23 mmol/L (ref 22–32)

## 2023-05-17 LAB — SAMPLE TO BLOOD BANK

## 2023-05-17 LAB — CBC
HCT: 37.8 % (ref 36.0–46.0)
Hemoglobin: 11.3 g/dL — ABNORMAL LOW (ref 12.0–15.0)
MCH: 28.6 pg (ref 26.0–34.0)
MCHC: 29.9 g/dL — ABNORMAL LOW (ref 30.0–36.0)
MCV: 95.7 fL (ref 80.0–100.0)
Platelets: 156 10*3/uL (ref 150–400)
RBC: 3.95 MIL/uL (ref 3.87–5.11)
RDW: 15.2 % (ref 11.5–15.5)
WBC: 5.3 10*3/uL (ref 4.0–10.5)
nRBC: 0 % (ref 0.0–0.2)

## 2023-05-17 LAB — PROTIME-INR
INR: 1.4 — ABNORMAL HIGH (ref 0.8–1.2)
Prothrombin Time: 17.4 seconds — ABNORMAL HIGH (ref 11.4–15.2)

## 2023-05-17 LAB — LACTIC ACID, PLASMA: Lactic Acid, Venous: 1.4 mmol/L (ref 0.5–1.9)

## 2023-05-17 LAB — ETHANOL: Alcohol, Ethyl (B): <10 mg/dL (ref <10)

## 2023-05-17 MED ORDER — ACETAMINOPHEN 500 MG PO TABS
1000.0000 mg | ORAL_TABLET | ORAL | Status: AC
  Administered 2023-05-17: 1000 mg via ORAL
  Filled 2023-05-17: qty 2

## 2023-05-17 MED ORDER — FENTANYL CITRATE PF 50 MCG/ML IJ SOSY
50.0000 ug | PREFILLED_SYRINGE | INTRAMUSCULAR | Status: DC | PRN
  Administered 2023-05-17: 50 ug via INTRAVENOUS
  Filled 2023-05-17: qty 1

## 2023-05-17 NOTE — ED Notes (Addendum)
Patient transported to CT 

## 2023-05-17 NOTE — Telephone Encounter (Signed)
Heather from [facility] said patient is having trouble with the Control Gel Formula Dressing (DUODERM CGF DRESSING) MISC Dr. Posey Rea order for her. She would like to know if it can be changed to a foam dressing. She said the patient has had that before with little issue. Best callback for Herbert Seta is [phone].

## 2023-05-17 NOTE — ED Provider Notes (Signed)
[place] EMERGENCY DEPARTMENT AT [facility] Provider Note   CSN: [id number] Arrival date & time: 05/17/23  2054     History  Chief Complaint  Patient presents with   Marletta Lor    Katelyn Jackson is a 77 y.o. female.   Fall   Patient is a 77 year old female with a past medical history significant for permanent A-fib  Patient presents to emergency room today after a fall.  [place] [employer], broadcasting was used for the entirety of visit.  Patient is wheelchair-bound and was at home today transferring out of wheelchair and fell while attempting to do so because she became somewhat disoriented/dizzy.  She states that she no longer feels disoriented or dizzy.  She did strike her head on the wheelchair as she fell.  She is on DOAC for permanent A-fib.  She did not lose consciousness she has not had any nausea or vomiting.  She is at her mental baseline.  She describes pain primarily in her left knee but also in her left shoulder and left side of her chest.  She denies any difficulty breathing or coughing up any blood.  No other associate symptoms.    Home Medications Prior to Admission medications   Medication Sig Start Date End Date Taking? Authorizing Provider  acetaminophen (TYLENOL) 325 MG tablet Take 2 tablets (650 mg total) by mouth every 6 (six) hours as needed for mild pain or moderate pain (or Fever >/= 101). 07/30/21   Hongalgi, Maximino Greenland, MD  allopurinol (ZYLOPRIM) 100 MG tablet Take 0.5 tablets (50 mg total) by mouth daily. 11/23/22   Plotnikov, Georgina Quint, MD  apixaban (ELIQUIS) 5 MG TABS tablet Take 1 tablet (5 mg total) by mouth 2 (two) times daily. 11/23/22   Plotnikov, Georgina Quint, MD  bisacodyl (DULCOLAX) 10 MG suppository Place 10 mg rectally daily as needed for moderate constipation.    [provider]  carvedilol (COREG) 6.25 MG tablet Take 1 tablet (6.25 mg total) by mouth 2 (two) times daily. 11/23/22 11/23/23  Plotnikov, Georgina Quint, MD  Cholecalciferol  (VITAMIN D3) 50 MCG (2000 UT) capsule Take 1 capsule (2,000 Units total) by mouth daily. 11/25/21   Plotnikov, Georgina Quint, MD  clotrimazole-betamethasone (LOTRISONE) cream Apply 1 Application topically 2 (two) times daily. 04/17/23   Plotnikov, Georgina Quint, MD  Control Gel Formula Dressing (DUODERM CGF DRESSING) MISC Apply 1 each topically once a week. On both thighs 04/17/23   Plotnikov, Georgina Quint, MD  Cyanocobalamin (VITAMIN B-12 PO) Take 2,000 mcg by mouth daily.    [provider]  diclofenac Sodium (VOLTAREN) 1 % GEL APPLY FOUR GRAMS FOUR TIMES DAILY AS NEEDED 12/22/22   Plotnikov, Georgina Quint, MD  docusate sodium (COLACE) 100 MG capsule Take 1 capsule (100 mg total) by mouth 2 (two) times daily. 11/25/21   Plotnikov, Georgina Quint, MD  lactulose (CHRONULAC) 10 GM/15ML solution TAKE 15 MILLILITERS BY MOUTH THREE TIMESDAILY 02/10/23   Plotnikov, Georgina Quint, MD  levothyroxine (SYNTHROID) 50 MCG tablet Take 1 tablet (50 mcg total) by mouth daily before breakfast. Overdue for Annual appt must see provider for future refills 11/23/22   Plotnikov, Georgina Quint, MD  lipase/protease/amylase (CREON) 12000-38000 units CPEP capsule Take 1 capsule (12,000 Units total) by mouth daily as needed (stomach problems). 11/25/21   Plotnikov, Georgina Quint, MD  magnesium oxide (MAG-OX) 400 MG tablet Take 1 tablet (400 mg total) by mouth 2 (two) times daily. 09/15/22   Plotnikov, Georgina Quint, MD  omeprazole (PRILOSEC) 40 MG  capsule TAKE ONE CAPSULE BY MOUTH DAILY 08/16/22   Plotnikov, Georgina Quint, MD  OVER THE COUNTER MEDICATION Apply 1 application topically daily as needed (apply to buttocks  as needed for irritaiton). Intensive Skin Care Therapy    [provider]  polyethylene glycol (MIRALAX / GLYCOLAX) 17 g packet Take 17 g by mouth 2 (two) times daily.    [provider]  polyvinyl alcohol (LIQUIFILM TEARS) 1.4 % ophthalmic solution Place 1 drop into both eyes as needed for dry eyes. 07/30/21   Hongalgi, Maximino Greenland, MD   potassium chloride (KLOR-CON) 8 MEQ tablet TAKE ONE (1) TABLET BY MOUTH EVERY DAY 11/30/22   Plotnikov, Georgina Quint, MD  pravastatin (PRAVACHOL) 20 MG tablet Take 1 tablet (20 mg total) by mouth daily at 6 PM. 11/25/21   Plotnikov, Georgina Quint, MD  rifaximin (XIFAXAN) 550 MG TABS tablet Take 1 tablet (550 mg total) by mouth 2 (two) times daily. 06/16/22   Plotnikov, Georgina Quint, MD  sennosides-docusate sodium (SENOKOT-S) 8.6-50 MG tablet Take 1 tablet by mouth daily. 11/25/21   Plotnikov, Georgina Quint, MD  simethicone (MYLICON) 40 MG/0.6ML drops Take 0.3 mLs (20 mg total) by mouth 4 (four) times daily as needed for flatulence. 07/30/21   Hongalgi, Maximino Greenland, MD  tamoxifen (NOLVADEX) 20 MG tablet Take 1 tablet (20 mg total) by mouth daily. Will take after finishing Exemestane 11/25/21   Plotnikov, Georgina Quint, MD  torsemide (DEMADEX) 100 MG tablet Take 1 tablet (100 mg total) by mouth 2 (two) times daily. 11/25/21   Plotnikov, Georgina Quint, MD  traMADol (ULTRAM) 50 MG tablet TAKE ONE (1) TABLET BY MOUTH EVERY 6 HOURS AS NEEDED 05/09/23   Plotnikov, Georgina Quint, MD  triamcinolone cream (KENALOG) 0.1 % Apply topically 2 (two) times daily. 02/01/23   Plotnikov, Georgina Quint, MD  trolamine salicylate (BLUE-EMU MAXIMUM PAIN RELIEF) 10 % cream Apply 1 Application topically as needed for muscle pain. 09/15/22   Plotnikov, Georgina Quint, MD      Allergies    Chlorhexidine gluconate and Penicillins    Review of Systems   Review of Systems  Physical Exam Updated Vital Signs BP 136/64 (BP Location: Right Arm)   Pulse (!) 51   Temp 99.4 F (37.4 C) (Oral)   Resp 19   LMP  (LMP Unknown)   SpO2 96%  Physical Exam Vitals and nursing note reviewed.  Constitutional:      General: She is not in acute distress.    Appearance: She is obese.  HENT:     Head: Normocephalic and atraumatic.     Nose: Nose normal.     Mouth/Throat:     Mouth: Mucous membranes are moist.  Eyes:     General: No scleral icterus. Cardiovascular:     Rate  and Rhythm: Normal rate and regular rhythm.     Pulses: Normal pulses.     Heart sounds: Normal heart sounds.  Pulmonary:     Effort: Pulmonary effort is normal. No respiratory distress.     Breath sounds: No wheezing.  Abdominal:     Palpations: Abdomen is soft.     Tenderness: There is no abdominal tenderness.  Musculoskeletal:     Cervical back: Normal range of motion.     Right lower leg: No edema.     Left lower leg: No edema.     Comments: Discomfort with manipulation of the left shoulder, left knee with discomfort with palpation or manipulation.  Left hip without palpable tenderness,  no C, T, L-spine tenderness.  No facial or scalp tenderness.  No other extremity tenderness.  No abdominal or chest wall tenderness.  Skin:    General: Skin is warm and dry.     Capillary Refill: Capillary refill takes less than 2 seconds.     Comments: There are 3 shallow stage II ulcers to her posterior legs/buttocks.  No erythema, they are clean and well bandaged with sophisticated gel dressing.  Neurological:     Mental Status: She is alert. Mental status is at baseline.  Psychiatric:        Mood and Affect: Mood normal.        Behavior: Behavior normal.     ED Results / Procedures / Treatments   Labs (all labs ordered are listed, but only abnormal results are displayed) Labs Reviewed  COMPREHENSIVE METABOLIC PANEL - Abnormal; Notable for the following components:      Result Value   Glucose, Bld 115 (*)    BUN 25 (*)    Creatinine, Ser 1.48 (*)    Albumin 3.3 (*)    Alkaline Phosphatase 148 (*)    GFR, Estimated 36 (*)    All other components within normal limits  CBC - Abnormal; Notable for the following components:   Hemoglobin 11.3 (*)    MCHC 29.9 (*)    All other components within normal limits  PROTIME-INR - Abnormal; Notable for the following components:   Prothrombin Time 17.4 (*)    INR 1.4 (*)    All other components within normal limits  I-STAT CHEM 8, ED -  Abnormal; Notable for the following components:   BUN 27 (*)    Creatinine, Ser 1.40 (*)    Glucose, Bld 110 (*)    All other components within normal limits  ETHANOL  LACTIC ACID, PLASMA  URINALYSIS, ROUTINE W REFLEX MICROSCOPIC  I-STAT CHEM 8, ED  SAMPLE TO BLOOD BANK    EKG EKG Interpretation Date/Time:  Wednesday May 17 2023 21:04:27 EDT Ventricular Rate:  60 PR Interval:    QRS Duration:  189 QT Interval:  461 QTC Calculation: 461 R Axis:   -73  Text Interpretation: Paced rhythm Left bundle branch block Confirmed by Vivi Barrack [phone]) on 05/17/2023 11:05:03 PM  Radiology CT Knee Left Wo Contrast  Result Date: 05/18/2023 CLINICAL DATA:  Fracture, knee patella and ?fem condyle fracture EXAM: CT OF THE LEFT KNEE WITHOUT CONTRAST TECHNIQUE: Multidetector CT imaging of the left knee was performed according to the standard protocol. Multiplanar CT image reconstructions were also generated. RADIATION DOSE REDUCTION: This exam was performed according to the departmental dose-optimization program which includes automated exposure control, adjustment of the mA and/or kV according to patient size and/or use of iterative reconstruction technique. COMPARISON:  05/17/2023 FINDINGS: Bones/Joint/Cartilage There is a fracture through the lower pole of the left patella. Mildly displaced fracture fragments. Advanced tricompartment osteoarthritis with near complete joint space loss most pronounced in the medial compartment. Extensive osteophyte formation. No medial femoral condyle fracture. Moderate joint effusion. Ligaments Suboptimally assessed by CT. Muscles and Tendons Unremarkable Soft tissues Edema throughout the subcutaneous soft tissues IMPRESSION: Displaced fracture through the inferior pole of the left patella. No fracture through the medial femoral condyle. Advanced tricompartment degenerative changes. Moderate joint effusion. Electronically Signed   By: Charlett Nose M.D.   On: 05/18/2023  00:18   DG Hip Unilat W or Wo Pelvis 2-3 Views Left  Result Date: 05/17/2023 CLINICAL DATA:  Fall.  Left pain EXAM:  DG HIP (WITH OR WITHOUT PELVIS) 2-3V LEFT COMPARISON:  None Available. FINDINGS: There is no evidence of hip fracture or dislocation. Hip joint space narrowing with marginal osteophytes suggesting mild-to-moderate osteoarthritis. IMPRESSION: 1. No fracture or dislocation. 2. Mild-to-moderate left hip osteoarthritis. Electronically Signed   By: Larose Hires D.O.   On: 05/17/2023 22:24   DG Knee Complete 4 Views Left  Result Date: 05/17/2023 CLINICAL DATA:  Level 2 fall on thinners. EXAM: LEFT KNEE - COMPLETE 4+ VIEW COMPARISON:  None Available. FINDINGS: There is comminuted fracture of the patella. There is also cortical irregularity of the medial femoral condyle concerning for an acute fracture. Evaluation is limited due to positioning and advanced osteoarthritis of the knee. Marked soft tissue swelling about the knee joint. IMPRESSION: 1. Comminuted fracture of the patella. 2. Cortical irregularity of the medial femoral condyle concerning for an acute fracture. Cross-sectional imaging for further evaluation is suggested. 3. Advanced tricompartmental knee osteoarthritis. Electronically Signed   By: Larose Hires D.O.   On: 05/17/2023 22:23   DG Ribs Unilateral W/Chest Left  Result Date: 05/17/2023 CLINICAL DATA:  Fall EXAM: LEFT RIBS AND CHEST - 3+ VIEW COMPARISON:  10/12/2021 FINDINGS: Left pacer remains in place, unchanged. Cardiomegaly, vascular congestion. Diffuse interstitial prominence, likely interstitial edema. No effusions or acute bony abnormality. No visible rib fracture. Previously seen distal left clavicle fracture on shoulder series not visualized on this study. IMPRESSION: Cardiomegaly, vascular congestion.  Suspect mild interstitial edema. No visible left rib fracture. Electronically Signed   By: Charlett Nose M.D.   On: 05/17/2023 22:21   DG Shoulder Left Port  Result  Date: 05/17/2023 CLINICAL DATA:  Fall EXAM: LEFT SHOULDER COMPARISON:  None Available. FINDINGS: Lucency in the distal left clavicle concerning for distal clavicle fracture. AC and glenohumeral joint osteoarthritis with joint space narrowing and spurring. No subluxation or dislocation. Pacer partially visualized. IMPRESSION: Distal left clavicle fracture. Electronically Signed   By: Charlett Nose M.D.   On: 05/17/2023 22:19   CT CERVICAL SPINE WO CONTRAST  Result Date: 05/17/2023 CLINICAL DATA:  Polytrauma, blunt EXAM: CT CERVICAL SPINE WITHOUT CONTRAST TECHNIQUE: Multidetector CT imaging of the cervical spine was performed without intravenous contrast. Multiplanar CT image reconstructions were also generated. RADIATION DOSE REDUCTION: This exam was performed according to the departmental dose-optimization program which includes automated exposure control, adjustment of the mA and/or kV according to patient size and/or use of iterative reconstruction technique. COMPARISON:  None Available. FINDINGS: Alignment: Slight anterolisthesis of C2 on C3 related to facet disease. Skull base and vertebrae: No acute fracture. No primary bone lesion or focal pathologic process. Soft tissues and spinal canal: No prevertebral fluid or swelling. No visible canal hematoma. Disc levels: Diffuse degenerative disc disease with disc space narrowing and spurring. Diffuse degenerative facet disease bilaterally. Upper chest: No acute findings Other: None IMPRESSION: Cervical spondylosis.  No acute bony abnormality. Electronically Signed   By: Charlett Nose M.D.   On: 05/17/2023 21:42   CT HEAD WO CONTRAST  Result Date: 05/17/2023 CLINICAL DATA:  Head trauma EXAM: CT HEAD WITHOUT CONTRAST TECHNIQUE: Contiguous axial images were obtained from the base of the skull through the vertex without intravenous contrast. RADIATION DOSE REDUCTION: This exam was performed according to the departmental dose-optimization program which includes  automated exposure control, adjustment of the mA and/or kV according to patient size and/or use of iterative reconstruction technique. COMPARISON:  Head CT 07/01/2021 FINDINGS: Brain: No evidence of acute infarction, hemorrhage, hydrocephalus, extra-axial collection or mass  lesion/mass effect. There is mild periventricular white matter hypodensity, likely chronic small vessel ischemic change. Vascular: Atherosclerotic calcifications are present within the cavernous internal carotid arteries. Skull: Normal. Negative for fracture or focal lesion. Sinuses/Orbits: No acute finding. Other: None. IMPRESSION: 1. No acute intracranial process. 2. Mild chronic small vessel ischemic change. Electronically Signed   By: Darliss Cheney M.D.   On: 05/17/2023 21:39    Procedures Procedures    Medications Ordered in ED Medications  fentaNYL (SUBLIMAZE) injection 50 mcg (50 mcg Intravenous Given 05/17/23 2157)  acetaminophen (TYLENOL) tablet 1,000 mg (1,000 mg Oral Given 05/17/23 2217)    ED Course/ Medical Decision Making/ A&P Clinical Course as of 05/18/23 0036  Thu May 18, 2023  0015 Consult to Dr. Victorino Dike, orthopedics, who recommends knee immobilizer and outpatient follow up if no other medical indication for admission. I appreciate his collaboration in the care of this patient.  [RS]    Clinical Course User Index [RS] Sponseller, Eugene Gavia, PA-C                             Medical Decision Making Amount and/or Complexity of Data Reviewed Labs: ordered. Radiology: ordered.  Risk OTC drugs. Prescription drug management.   Patient is a 76 year old female with a past medical history significant for permanent A-fib  Patient presents to emergency room today after a fall.  [place] [employer], broadcasting was used for the entirety of visit.  Patient is wheelchair-bound and was at home today transferring out of wheelchair and fell while attempting to do so because she became somewhat disoriented/dizzy.  She  states that she no longer feels disoriented or dizzy.  She did strike her head on the wheelchair as she fell.  She is on DOAC for permanent A-fib.  She did not lose consciousness she has not had any nausea or vomiting.  She is at her mental baseline.  She describes pain primarily in her left knee but also in her left shoulder and left side of her chest.  She denies any difficulty breathing or coughing up any blood.  No other associate symptoms.  Imaging with patellar fracture and left clavicle fracture  This was a fall on anticoagulants however likely she had did not suffer any intracranial hemorrhage.  CT imaging of left knee pending to evaluate for femoral condyle fracture.  Patient care handed off to oncoming team 12:37 AM  For follow-up on CT imaging and reevaluation.  Final Clinical Impression(s) / ED Diagnoses Final diagnoses:  Fall, initial encounter  Injury of head, initial encounter  Closed displaced fracture of left clavicle, unspecified part of clavicle, initial encounter  Closed displaced fracture of left patella, unspecified fracture morphology, initial encounter    Rx / DC Orders ED Discharge Orders     None         Gailen Shelter, [place] 05/18/23 0037    Loetta Rough, MD 05/18/23 [phone]

## 2023-05-17 NOTE — ED Triage Notes (Signed)
Pt bib [facility] from home pt slid out of wheelchari and fell and landed on her left side hitting her head. Pt stated she fell because she was dizzy. Pt stated she did not lose consciousness but family stated she did.. Pt is alert and oriented to 4. C/o pain all over left side of body.  Bp- 158/70 Hr 69 98% ra 131 cbg

## 2023-05-18 ENCOUNTER — Encounter (HOSPITAL_COMMUNITY): Payer: Self-pay | Admitting: Internal Medicine

## 2023-05-18 ENCOUNTER — Other Ambulatory Visit: Payer: Self-pay

## 2023-05-18 DIAGNOSIS — R8290 Unspecified abnormal findings in urine: Secondary | ICD-10-CM | POA: Diagnosis not present

## 2023-05-18 DIAGNOSIS — Z950 Presence of cardiac pacemaker: Secondary | ICD-10-CM | POA: Diagnosis not present

## 2023-05-18 DIAGNOSIS — I482 Chronic atrial fibrillation, unspecified: Secondary | ICD-10-CM

## 2023-05-18 DIAGNOSIS — E039 Hypothyroidism, unspecified: Secondary | ICD-10-CM

## 2023-05-18 DIAGNOSIS — Z853 Personal history of malignant neoplasm of breast: Secondary | ICD-10-CM | POA: Diagnosis not present

## 2023-05-18 DIAGNOSIS — I10 Essential (primary) hypertension: Secondary | ICD-10-CM

## 2023-05-18 DIAGNOSIS — W19XXXA Unspecified fall, initial encounter: Secondary | ICD-10-CM | POA: Diagnosis not present

## 2023-05-18 DIAGNOSIS — S82042A Displaced comminuted fracture of left patella, initial encounter for closed fracture: Secondary | ICD-10-CM | POA: Diagnosis not present

## 2023-05-18 DIAGNOSIS — W010XXA Fall on same level from slipping, tripping and stumbling without subsequent striking against object, initial encounter: Secondary | ICD-10-CM | POA: Diagnosis not present

## 2023-05-18 DIAGNOSIS — S82009A Unspecified fracture of unspecified patella, initial encounter for closed fracture: Secondary | ICD-10-CM | POA: Diagnosis present

## 2023-05-18 DIAGNOSIS — S42002A Fracture of unspecified part of left clavicle, initial encounter for closed fracture: Secondary | ICD-10-CM | POA: Diagnosis not present

## 2023-05-18 DIAGNOSIS — S82002A Unspecified fracture of left patella, initial encounter for closed fracture: Secondary | ICD-10-CM

## 2023-05-18 DIAGNOSIS — S42032A Displaced fracture of lateral end of left clavicle, initial encounter for closed fracture: Secondary | ICD-10-CM | POA: Diagnosis not present

## 2023-05-18 DIAGNOSIS — N183 Chronic kidney disease, stage 3 unspecified: Secondary | ICD-10-CM | POA: Diagnosis present

## 2023-05-18 DIAGNOSIS — N1832 Chronic kidney disease, stage 3b: Secondary | ICD-10-CM

## 2023-05-18 DIAGNOSIS — S42009A Fracture of unspecified part of unspecified clavicle, initial encounter for closed fracture: Secondary | ICD-10-CM | POA: Diagnosis present

## 2023-05-18 LAB — URINALYSIS, ROUTINE W REFLEX MICROSCOPIC
Bilirubin Urine: NEGATIVE
Glucose, UA: NEGATIVE mg/dL
Ketones, ur: NEGATIVE mg/dL
Nitrite: NEGATIVE
Protein, ur: 100 mg/dL — AB
Specific Gravity, Urine: 1.015 (ref 1.005–1.030)
WBC, UA: >50 WBC/hpf (ref 0–5)
pH: 5 (ref 5.0–8.0)

## 2023-05-18 MED ORDER — ACETAMINOPHEN 650 MG RE SUPP: 650.0000 mg | Freq: Four times a day (QID) | RECTAL | Status: DC | PRN

## 2023-05-18 MED ORDER — OXYCODONE-ACETAMINOPHEN 5-325 MG PO TABS
1.0000 | ORAL_TABLET | Freq: Four times a day (QID) | ORAL | Status: DC | PRN
  Administered 2023-05-18 – 2023-05-24 (×15): 1 via ORAL
  Filled 2023-05-18 (×16): qty 1

## 2023-05-18 MED ORDER — FENTANYL CITRATE PF 50 MCG/ML IJ SOSY
50.0000 ug | PREFILLED_SYRINGE | Freq: Once | INTRAMUSCULAR | Status: AC
  Administered 2023-05-18: 50 ug via INTRAVENOUS
  Filled 2023-05-18: qty 1

## 2023-05-18 MED ORDER — SODIUM CHLORIDE 0.9% FLUSH
3.0000 mL | Freq: Two times a day (BID) | INTRAVENOUS | Status: DC
  Administered 2023-05-18 – 2023-05-24 (×13): 3 mL via INTRAVENOUS

## 2023-05-18 MED ORDER — ALBUTEROL SULFATE (2.5 MG/3ML) 0.083% IN NEBU
2.5000 mg | INHALATION_SOLUTION | Freq: Four times a day (QID) | RESPIRATORY_TRACT | Status: DC | PRN
  Filled 2023-05-18: qty 3

## 2023-05-18 MED ORDER — LEVOTHYROXINE SODIUM 50 MCG PO TABS
50.0000 ug | ORAL_TABLET | Freq: Every day | ORAL | Status: DC
  Administered 2023-05-19 – 2023-05-24 (×6): 50 ug via ORAL
  Filled 2023-05-18 (×6): qty 1

## 2023-05-18 MED ORDER — HYDROCODONE-ACETAMINOPHEN 5-325 MG PO TABS
1.0000 | ORAL_TABLET | Freq: Four times a day (QID) | ORAL | Status: DC | PRN
  Administered 2023-05-18: 1 via ORAL
  Filled 2023-05-18: qty 1

## 2023-05-18 MED ORDER — FENTANYL CITRATE PF 50 MCG/ML IJ SOSY
12.5000 ug | PREFILLED_SYRINGE | INTRAMUSCULAR | Status: AC | PRN
  Administered 2023-05-18: 12.5 ug via INTRAVENOUS
  Filled 2023-05-18: qty 1

## 2023-05-18 MED ORDER — APIXABAN 5 MG PO TABS
5.0000 mg | ORAL_TABLET | Freq: Two times a day (BID) | ORAL | Status: DC
  Administered 2023-05-18 – 2023-05-24 (×13): 5 mg via ORAL
  Filled 2023-05-18 (×13): qty 1

## 2023-05-18 MED ORDER — HYDRALAZINE HCL 20 MG/ML IJ SOLN: 10.0000 mg | INTRAMUSCULAR | Status: DC | PRN

## 2023-05-18 MED ORDER — FENTANYL CITRATE PF 50 MCG/ML IJ SOSY
25.0000 ug | PREFILLED_SYRINGE | INTRAMUSCULAR | Status: AC | PRN
  Administered 2023-05-18: 25 ug via INTRAVENOUS
  Filled 2023-05-18: qty 1

## 2023-05-18 MED ORDER — ACETAMINOPHEN 325 MG PO TABS
650.0000 mg | ORAL_TABLET | Freq: Four times a day (QID) | ORAL | Status: DC | PRN
  Administered 2023-05-22 – 2023-05-24 (×5): 650 mg via ORAL
  Filled 2023-05-18 (×6): qty 2

## 2023-05-18 MED ORDER — FENTANYL CITRATE PF 50 MCG/ML IJ SOSY
25.0000 ug | PREFILLED_SYRINGE | INTRAMUSCULAR | Status: DC | PRN
  Administered 2023-05-19 – 2023-05-24 (×10): 25 ug via INTRAVENOUS
  Filled 2023-05-18 (×11): qty 1

## 2023-05-18 NOTE — ED Notes (Signed)
ED TO INPATIENT HANDOFF REPORT  ED Nurse Name and Phone #: Jean Skow [phone]  S Name/Age/Gender Katelyn Jackson 77 y.o. female Room/Bed: 004C/004C  Code Status   Code Status: Prior  Home/SNF/Other Home Patient oriented to: self, place, time, and situation Is this baseline? Yes   Triage Complete: Triage complete  Chief Complaint Fall [W19.XXXA]  Triage Note Pt bib [facility] from home pt slid out of wheelchari and fell and landed on her left side hitting her head. Pt stated she fell because she was dizzy. Pt stated she did not lose consciousness but family stated she did.. Pt is alert and oriented to 4. C/o pain all over left side of body.  Bp- 158/70 Hr 69 98% ra 131 cbg    Allergies Allergies  Allergen Reactions   Chlorhexidine Gluconate Hives   Penicillins Itching    Has patient had a PCN reaction causing immediate rash, facial/tongue/throat swelling, SOB or lightheadedness with hypotension: no Has patient had a PCN reaction causing severe rash involving mucus membranes or skin necrosis: No Has patient had a PCN reaction that required hospitalization: No Has patient had a PCN reaction occurring within the last 10 years: No If all of the above answers are "NO", then may proceed with Cephalosporin use. Tolerated Cephalosporin Date: 03/17/21. Td Ancef 2g 07/26/21    Level of Care/Admitting Diagnosis ED Disposition     ED Disposition  Admit   Condition  --   Comment  Hospital Area: [facility] [[id number]]  Level of Care: Med-Surg [16]  May place patient in observation at [facility] or [facility] if equivalent level of care is available:: Yes  Covid Evaluation: Asymptomatic - no recent exposure (last 10 days) testing not required  Diagnosis: Fall [[id number]]  Admitting Physician: Darlin Drop [[id number]]  Attending Physician: Darlin Drop [[id number]]          B Medical/Surgery History Past Medical History:  Diagnosis Date   Arthritis    Breast  lesion 12/07/2021   Cancer [facility]    breast cancer   Chronic atrial fibrillation (HCC)    Chronic kidney disease    sees Dr Lowell Guitar   Colonization with VRE (vancomycin-resistant enterococcus) 09/22/2021   Cramps, muscle, general    Dry skin 09/22/2021   Dyspnea on exertion    Dysrhythmia    a-fib,    ESBL E. coli carrier 09/22/2021   GERD (gastroesophageal reflux disease)    Headache    Herpes labialis 08/19/2021   Hyperlipidemia    Hypertension    Hypothyroidism    Lymphedema    Moderate to severe pulmonary hypertension (HCC)    Obesity    Osteoarthritis of right knee 08/19/2021   Personal history of radiation therapy    Pneumonia 12/2020   Presence of permanent cardiac pacemaker 01/21/2021   for bradycardia    Syncope 12/2020   needed a pacemaker   Past Surgical History:  Procedure Laterality Date   APPENDECTOMY     BREAST BIOPSY Left 2018   BREAST LUMPECTOMY Left 04/04/2017   x3   BREAST LUMPECTOMY WITH NEEDLE LOCALIZATION AND AXILLARY SENTINEL LYMPH NODE BX Left 04/04/2017   Procedure: BREAST LUMPECTOMY WITH NEEDLE LOCALIZATION x3 AND AXILLARY SENTINEL LYMPH NODE BX;  Surgeon: Almond Lint, MD;  Location: [facility] OR;  Service: General;  Laterality: Left;   CATARACT EXTRACTION W/ INTRAOCULAR LENS  IMPLANT, BILATERAL Bilateral 2018   COLONOSCOPY     EYE SURGERY     GLAUCOMA SURGERY Bilateral 2018  HARDWARE REMOVAL Right 07/26/2021   Procedure: RIGHT SHOULDER HARDWARE REMOVAL WITH WASHOUT;  Surgeon: Bjorn Pippin, MD;  Location: [facility] ORS;  Service: Orthopedics;  Laterality: Right;   INSERT / REPLACE / REMOVE PACEMAKER  01/21/2021   IR FLUORO GUIDE CV LINE LEFT  07/29/2021   IR REMOVAL TUN CV CATH W/O FL  09/07/2021   IR US GUIDE BX ASP/DRAIN  07/16/2021   RE-EXCISION OF BREAST CANCER,SUPERIOR MARGINS Left 04/26/2017   Procedure: RE-EXCISION OF LEFT BREAST CANCER;  Surgeon: Almond Lint, MD;  Location: [facility] OR;  Service: General;  Laterality: Left;   SHOULDER HEMI-ARTHROPLASTY Right  03/17/2021   Procedure: SHOULDER HEMI-ARTHROPLASTY;  Surgeon: Bjorn Pippin, MD;  Location: [facility] ORS;  Service: Orthopedics;  Laterality: Right;     A IV Location/Drains/Wounds Patient Lines/Drains/Airways Status     Active Line/Drains/Airways     Name Placement date Placement time Site Days   Peripheral IV 05/17/23 22 G Left;Posterior Hand 05/17/23  2100  Hand  1   Incision (Closed) 07/26/21 Shoulder Right 07/26/21  1440  -- 661   Pressure Injury 07/03/21 Thigh Posterior;Right Stage 3 -  Full thickness tissue loss. Subcutaneous fat may be visible but bone, tendon or muscle are NOT exposed. 07/03/21  0453  -- 684   Pressure Injury 07/03/21 Sacrum Stage 2 -  Partial thickness loss of dermis presenting as a shallow open injury with a red, pink wound bed without slough. 07/03/21  2105  -- 684   Wound / Incision (Open or Dehisced) 07/11/21 Shoulder Right Redness 07/11/21  1135  Shoulder  676            Intake/Output Last 24 hours No intake or output data in the 24 hours ending 05/18/23 0820  Labs/Imaging Results for orders placed or performed during the hospital encounter of 05/17/23 (from the past 48 hour(s))  Comprehensive metabolic panel     Status: Abnormal   Collection Time: 05/17/23  9:07 PM  Result Value Ref Range   Sodium 139 135 - 145 mmol/L   Potassium 3.5 3.5 - 5.1 mmol/L   Chloride 106 98 - 111 mmol/L   CO2 24 22 - 32 mmol/L   Glucose, Bld 115 (H) 70 - 99 mg/dL    Comment: Glucose reference range applies only to samples taken after fasting for at least 8 hours.   BUN 25 (H) 8 - 23 mg/dL   Creatinine, Ser 0.98 (H) 0.44 - 1.00 mg/dL   Calcium 9.8 8.9 - 11.9 mg/dL   Total Protein 6.7 6.5 - 8.1 g/dL   Albumin 3.3 (L) 3.5 - 5.0 g/dL   AST 18 15 - 41 U/L   ALT 19 0 - 44 U/L   Alkaline Phosphatase 148 (H) 38 - 126 U/L   Total Bilirubin 0.8 0.3 - 1.2 mg/dL   GFR, Estimated 36 (L) >60 mL/min    Comment: (NOTE) Calculated using the CKD-EPI Creatinine Equation (2021)     Anion gap 9 5 - 15    Comment: Performed at [facility], [address] [place], [place] [address]  CBC     Status: Abnormal   Collection Time: 05/17/23  9:07 PM  Result Value Ref Range   WBC 5.3 4.0 - 10.5 K/uL   RBC 3.95 3.87 - 5.11 MIL/uL   Hemoglobin 11.3 (L) 12.0 - 15.0 g/dL   HCT 95.6 21.3 - 08.6 %   MCV 95.7 80.0 - 100.0 fL   MCH 28.6 26.0 - 34.0 pg  MCHC 29.9 (L) 30.0 - 36.0 g/dL   RDW 09.8 11.9 - 14.7 %   Platelets 156 150 - 400 K/uL   nRBC 0.0 0.0 - 0.2 %    Comment: Performed at [facility], [address] [place], [place] [address]  Ethanol     Status: None   Collection Time: 05/17/23  9:07 PM  Result Value Ref Range   Alcohol, Ethyl (B) <10 <10 mg/dL    Comment: (NOTE) Lowest detectable limit for serum alcohol is 10 mg/dL.  For medical purposes only. Performed at [facility], [address], [place], [place] [address]   Lactic acid, plasma     Status: None   Collection Time: 05/17/23  9:07 PM  Result Value Ref Range   Lactic Acid, Venous 1.4 0.5 - 1.9 mmol/L    Comment: Performed at [facility], [address], [place], [place] [address]  Protime-INR     Status: Abnormal   Collection Time: 05/17/23  9:07 PM  Result Value Ref Range   Prothrombin Time 17.4 (H) 11.4 - 15.2 seconds   INR 1.4 (H) 0.8 - 1.2    Comment: (NOTE) INR goal varies based on device and disease states. Performed at [facility], [address], [place], [place] [address]   Sample to Blood Bank     Status: None   Collection Time: 05/17/23  9:07 PM  Result Value Ref Range   Blood Bank Specimen SAMPLE AVAILABLE FOR TESTING    Sample Expiration      05/20/2023,2359 Performed at [facility], [address], [place], [place] [address]   I-stat chem 8, ed     Status: Abnormal   Collection Time: 05/17/23  9:15 PM  Result Value Ref Range   Sodium 139 135 - 145 mmol/L   Potassium 3.6 3.5 - 5.1 mmol/L   Chloride 107 98 - 111 mmol/L   BUN 27 (H) 8  - 23 mg/dL   Creatinine, Ser 3.24 (H) 0.44 - 1.00 mg/dL   Glucose, Bld 401 (H) 70 - 99 mg/dL    Comment: Glucose reference range applies only to samples taken after fasting for at least 8 hours.   Calcium, Ion 1.16 1.15 - 1.40 mmol/L   TCO2 23 22 - 32 mmol/L   Hemoglobin 12.6 12.0 - 15.0 g/dL   HCT 02.7 25.3 - 66.4 %  Urinalysis, Routine w reflex microscopic -Urine, Clean Catch     Status: Abnormal   Collection Time: 05/18/23  3:25 AM  Result Value Ref Range   Color, Urine AMBER (A) YELLOW    Comment: BIOCHEMICALS MAY BE AFFECTED BY COLOR   APPearance CLOUDY (A) CLEAR   Specific Gravity, Urine 1.015 1.005 - 1.030   pH 5.0 5.0 - 8.0   Glucose, UA NEGATIVE NEGATIVE mg/dL   Hgb urine dipstick MODERATE (A) NEGATIVE   Bilirubin Urine NEGATIVE NEGATIVE   Ketones, ur NEGATIVE NEGATIVE mg/dL   Protein, ur 403 (A) NEGATIVE mg/dL   Nitrite NEGATIVE NEGATIVE   Leukocytes,Ua LARGE (A) NEGATIVE   RBC / HPF 21-50 0 - 5 RBC/hpf   WBC, UA >50 0 - 5 WBC/hpf   Bacteria, UA FEW (A) NONE SEEN   Squamous Epithelial / HPF 0-5 0 - 5 /HPF   WBC Clumps PRESENT    Non Squamous Epithelial 0-5 (A) NONE SEEN    Comment: Performed at [facility], [address], [place], [place] [address]  CT Knee Left Wo Contrast  Result Date: 05/18/2023 CLINICAL DATA:  Fracture, knee patella and ?fem condyle fracture EXAM: CT OF THE LEFT KNEE WITHOUT CONTRAST TECHNIQUE: Multidetector CT imaging of the left knee was performed according to the standard protocol. Multiplanar CT image reconstructions were also generated. RADIATION DOSE REDUCTION: This exam was performed according to the departmental dose-optimization program which includes automated exposure control, adjustment of the mA and/or kV according to patient size and/or use of iterative reconstruction technique. COMPARISON:  05/17/2023 FINDINGS: Bones/Joint/Cartilage There is a fracture through the lower pole of the left patella. Mildly displaced fracture  fragments. Advanced tricompartment osteoarthritis with near complete joint space loss most pronounced in the medial compartment. Extensive osteophyte formation. No medial femoral condyle fracture. Moderate joint effusion. Ligaments Suboptimally assessed by CT. Muscles and Tendons Unremarkable Soft tissues Edema throughout the subcutaneous soft tissues IMPRESSION: Displaced fracture through the inferior pole of the left patella. No fracture through the medial femoral condyle. Advanced tricompartment degenerative changes. Moderate joint effusion. Electronically Signed   By: Charlett Nose M.D.   On: 05/18/2023 00:18   DG Hip Unilat W or Wo Pelvis 2-3 Views Left  Result Date: 05/17/2023 CLINICAL DATA:  Fall.  Left pain EXAM: DG HIP (WITH OR WITHOUT PELVIS) 2-3V LEFT COMPARISON:  None Available. FINDINGS: There is no evidence of hip fracture or dislocation. Hip joint space narrowing with marginal osteophytes suggesting mild-to-moderate osteoarthritis. IMPRESSION: 1. No fracture or dislocation. 2. Mild-to-moderate left hip osteoarthritis. Electronically Signed   By: Larose Hires D.O.   On: 05/17/2023 22:24   DG Knee Complete 4 Views Left  Result Date: 05/17/2023 CLINICAL DATA:  Level 2 fall on thinners. EXAM: LEFT KNEE - COMPLETE 4+ VIEW COMPARISON:  None Available. FINDINGS: There is comminuted fracture of the patella. There is also cortical irregularity of the medial femoral condyle concerning for an acute fracture. Evaluation is limited due to positioning and advanced osteoarthritis of the knee. Marked soft tissue swelling about the knee joint. IMPRESSION: 1. Comminuted fracture of the patella. 2. Cortical irregularity of the medial femoral condyle concerning for an acute fracture. Cross-sectional imaging for further evaluation is suggested. 3. Advanced tricompartmental knee osteoarthritis. Electronically Signed   By: Larose Hires D.O.   On: 05/17/2023 22:23   DG Ribs Unilateral W/Chest Left  Result Date:  05/17/2023 CLINICAL DATA:  Fall EXAM: LEFT RIBS AND CHEST - 3+ VIEW COMPARISON:  10/12/2021 FINDINGS: Left pacer remains in place, unchanged. Cardiomegaly, vascular congestion. Diffuse interstitial prominence, likely interstitial edema. No effusions or acute bony abnormality. No visible rib fracture. Previously seen distal left clavicle fracture on shoulder series not visualized on this study. IMPRESSION: Cardiomegaly, vascular congestion.  Suspect mild interstitial edema. No visible left rib fracture. Electronically Signed   By: Charlett Nose M.D.   On: 05/17/2023 22:21   DG Shoulder Left Port  Result Date: 05/17/2023 CLINICAL DATA:  Fall EXAM: LEFT SHOULDER COMPARISON:  None Available. FINDINGS: Lucency in the distal left clavicle concerning for distal clavicle fracture. AC and glenohumeral joint osteoarthritis with joint space narrowing and spurring. No subluxation or dislocation. Pacer partially visualized. IMPRESSION: Distal left clavicle fracture. Electronically Signed   By: Charlett Nose M.D.   On: 05/17/2023 22:19   CT CERVICAL SPINE WO CONTRAST  Result Date: 05/17/2023 CLINICAL DATA:  Polytrauma, blunt EXAM: CT CERVICAL SPINE WITHOUT CONTRAST TECHNIQUE: Multidetector CT imaging of the cervical spine was performed without intravenous contrast. Multiplanar CT image reconstructions were also generated. RADIATION DOSE REDUCTION: This exam  was performed according to the departmental dose-optimization program which includes automated exposure control, adjustment of the mA and/or kV according to patient size and/or use of iterative reconstruction technique. COMPARISON:  None Available. FINDINGS: Alignment: Slight anterolisthesis of C2 on C3 related to facet disease. Skull base and vertebrae: No acute fracture. No primary bone lesion or focal pathologic process. Soft tissues and spinal canal: No prevertebral fluid or swelling. No visible canal hematoma. Disc levels: Diffuse degenerative disc disease with disc  space narrowing and spurring. Diffuse degenerative facet disease bilaterally. Upper chest: No acute findings Other: None IMPRESSION: Cervical spondylosis.  No acute bony abnormality. Electronically Signed   By: Charlett Nose M.D.   On: 05/17/2023 21:42   CT HEAD WO CONTRAST  Result Date: 05/17/2023 CLINICAL DATA:  Head trauma EXAM: CT HEAD WITHOUT CONTRAST TECHNIQUE: Contiguous axial images were obtained from the base of the skull through the vertex without intravenous contrast. RADIATION DOSE REDUCTION: This exam was performed according to the departmental dose-optimization program which includes automated exposure control, adjustment of the mA and/or kV according to patient size and/or use of iterative reconstruction technique. COMPARISON:  Head CT 07/01/2021 FINDINGS: Brain: No evidence of acute infarction, hemorrhage, hydrocephalus, extra-axial collection or mass lesion/mass effect. There is mild periventricular white matter hypodensity, likely chronic small vessel ischemic change. Vascular: Atherosclerotic calcifications are present within the cavernous internal carotid arteries. Skull: Normal. Negative for fracture or focal lesion. Sinuses/Orbits: No acute finding. Other: None. IMPRESSION: 1. No acute intracranial process. 2. Mild chronic small vessel ischemic change. Electronically Signed   By: Darliss Cheney M.D.   On: 05/17/2023 21:39    Pending Labs Unresulted Labs (From admission, onward)    None       Vitals/Pain Today's Vitals   05/18/23 0630 05/18/23 0645 05/18/23 0700 05/18/23 0715  BP: (!) 130/56 (!) 120/56 (!) 112/55 120/61  Pulse: (!) 59 61 61 63  Resp: 16 17 16 19   Temp:      TempSrc:      SpO2: 98% 97% 96% 97%  Weight:      Height:      PainSc:        Isolation Precautions No active isolations  Medications Medications  fentaNYL (SUBLIMAZE) injection 50 mcg (50 mcg Intravenous Not Given 05/18/23 0511)  acetaminophen (TYLENOL) tablet 1,000 mg (1,000 mg Oral Given  05/17/23 2217)  fentaNYL (SUBLIMAZE) injection 12.5 mcg (12.5 mcg Intravenous Given 05/18/23 1610)    Mobility walks     Focused Assessments Musculoskeletal    R Recommendations: See Admitting Provider Note  Report given to:   Additional Notes:

## 2023-05-18 NOTE — Progress Notes (Signed)
Orthopedic Tech Progress Note Patient Details:  Katelyn Jackson [birth year] [id number]  Ortho Devices Type of Ortho Device: Knee Immobilizer Ortho Device/Splint Location: LLE Ortho Device/Splint Interventions: Ordered, Application, Adjustment   Post Interventions Patient Tolerated: Fair Instructions Provided: Care of device KI applied, however due to patients leg being much larger in the proximal portion compared to the distal area there is some gapping at the bottom of the KI near the patients ankle. Darleen Crocker 05/18/2023, 12:23 AM

## 2023-05-18 NOTE — Plan of Care (Signed)

## 2023-05-18 NOTE — ED Notes (Signed)
Ortho tech paged  

## 2023-05-18 NOTE — ED Provider Notes (Incomplete)
  Physical Exam  BP 136/64 (BP Location: Right Arm)   Pulse (!) 51   Temp 99.4 F (37.4 C) (Oral)   Resp 19   LMP  (LMP Unknown)   SpO2 96%   Physical Exam  Procedures  Procedures  ED Course / MDM   Clinical Course as of 05/18/23 0029  Thu May 18, 2023  0015 Consult to Dr. Victorino Dike, orthopedics, who recommends knee immobilizer and outpatient follow up if no other medical indication for admission. I appreciate his collaboration in the care of this patient.  [RS]    Clinical Course User Index [RS] Shandell Jallow, Eugene Gavia, PA-C   Medical Decision Making Amount and/or Complexity of Data Reviewed Labs: ordered. Radiology: ordered.  Risk OTC drugs. Prescription drug management.   *** Care of this patient assumed from preceding ED provider Solon Augusta, PA-C at time of shift change.  Please see his associated note for further insight and the patient ED course.  In brief patient is a 77 year old female primarily Russian-speaking who presents to the ED for evaluation after fall on thinners.  She is anticoagulated with Xarelto for A-fib.  Nonambulatory but transfers, fell between her wheelchair and other chair today, left leg pain.  Left distal clavicular fracture, comminuted left patellar fracture Plan for CT head and C-spine unremarkable as of the last.  N therefore eurologic exam..  CT femoral condyle fracture.

## 2023-05-18 NOTE — Progress Notes (Signed)
Orthopedic Tech Progress Note Patient Details:  Katelyn Jackson Dec 02, 1945 [id number]  Ortho Devices Type of Ortho Device: Shoulder immobilizer Ortho Device/Splint Location: LUE Ortho Device/Splint Interventions: Ordered, Application, Adjustment   Post Interventions Patient Tolerated: Well Instructions Provided: Care of device, Adjustment of device  [name] 05/18/2023, 12:53 AM

## 2023-05-18 NOTE — TOC CAGE-AID Note (Signed)
Transition of Care [facility]) - CAGE-AID Screening   Patient Details  Name: Katelyn Jackson MRN: [id number] Date of Birth: 03/26/1946  Transition of [facility]) CM/SW Contact:    Katha Hamming, RN Phone Number: 05/18/2023, 6:14 AM    CAGE-AID Screening:    Have You Ever Felt You Ought to Cut Down on Your Drinking or Drug Use?: No Have People Annoyed You By Critizing Your Drinking Or Drug Use?: No Have You Felt Bad Or Guilty About Your Drinking Or Drug Use?: No Have You Ever Had a Drink or Used Drugs First Thing In The Morning to Steady Your Nerves or to Get Rid of a Hangover?: No CAGE-AID Score: 0  Substance Abuse Education Offered: No

## 2023-05-18 NOTE — Consult Note (Addendum)
Reason for Consult:Left patella and distal clavicle fxs Referring Physician: Madelyn Flavors Time called: 1003 Time at bedside: 1025   Katelyn Jackson is an 77 y.o. female.  HPI: Katelyn Jackson fell at home while transferring yesterday. She had immediate knee and shoulder pain and was brought to the ED. Workup showed left patella and distal clavicle fxs and orthopedic surgery was consulted. Recommended OP f/u but pt was admitted for mobility and safety considerations. She lives at home with son and spouse who both work. She is non-ambulatory and only stands to transfer.   Past Medical History:  Diagnosis Date   Arthritis    Breast lesion 12/07/2021   Cancer [facility])    breast cancer   Chronic atrial fibrillation (HCC)    Chronic kidney disease    sees Dr Lowell Guitar   Colonization with VRE (vancomycin-resistant enterococcus) 09/22/2021   Cramps, muscle, general    Dry skin 09/22/2021   Dyspnea on exertion    Dysrhythmia    a-fib,    ESBL E. coli carrier 09/22/2021   GERD (gastroesophageal reflux disease)    Headache    Herpes labialis 08/19/2021   Hyperlipidemia    Hypertension    Hypothyroidism    Lymphedema    Moderate to severe pulmonary hypertension (HCC)    Obesity    Osteoarthritis of right knee 08/19/2021   Personal history of radiation therapy    Pneumonia 12/2020   Presence of permanent cardiac pacemaker 01/21/2021   for bradycardia    Syncope 12/2020   needed a pacemaker    Past Surgical History:  Procedure Laterality Date   APPENDECTOMY     BREAST BIOPSY Left 2018   BREAST LUMPECTOMY Left 04/04/2017   x3   BREAST LUMPECTOMY WITH NEEDLE LOCALIZATION AND AXILLARY SENTINEL LYMPH NODE BX Left 04/04/2017   Procedure: BREAST LUMPECTOMY WITH NEEDLE LOCALIZATION x3 AND AXILLARY SENTINEL LYMPH NODE BX;  Surgeon: Almond Lint, MD;  [facility]: [facility];  Service: General;  Laterality: Left;   CATARACT EXTRACTION W/ INTRAOCULAR LENS  IMPLANT, BILATERAL Bilateral 2018   COLONOSCOPY      EYE SURGERY     GLAUCOMA SURGERY Bilateral 2018   HARDWARE REMOVAL Right 07/26/2021   Procedure: RIGHT SHOULDER HARDWARE REMOVAL WITH WASHOUT;  Surgeon: Bjorn Pippin, MD;  Location: [facility];  Service: Orthopedics;  Laterality: Right;   INSERT / REPLACE / REMOVE PACEMAKER  01/21/2021   IR FLUORO GUIDE CV LINE LEFT  07/29/2021   IR REMOVAL TUN CV CATH W/O FL  09/07/2021   IR US GUIDE BX ASP/DRAIN  07/16/2021   RE-EXCISION OF BREAST CANCER,SUPERIOR MARGINS Left 04/26/2017   Procedure: RE-EXCISION OF LEFT BREAST CANCER;  Surgeon: Almond Lint, MD;  [facility]: [facility];  Service: General;  Laterality: Left;   SHOULDER HEMI-ARTHROPLASTY Right 03/17/2021   Procedure: SHOULDER HEMI-ARTHROPLASTY;  Surgeon: Bjorn Pippin, MD;  Location: [facility];  Service: Orthopedics;  Laterality: Right;    Family History  Problem Relation Age of Onset   CAD Mother 19   Stroke Mother 44       hemorr CVA   COPD Father 15   Cancer Neg Hx     Social History:  reports that she has never smoked. She has never used smokeless tobacco. She reports that she does not drink alcohol and does not use drugs.  Allergies:  Allergies  Allergen Reactions   Chlorhexidine Gluconate Hives   Penicillins Itching    Tolerated Cephalosporin Date: 03/17/21. Td Ancef 2g 07/26/21  Medications: I have reviewed the patient's current medications.  Results for orders placed or performed during the hospital encounter of 05/17/23 (from the past 48 hour(s))  Comprehensive metabolic panel     Status: Abnormal   Collection Time: 05/17/23  9:07 PM  Result Value Ref Range   Sodium 139 135 - 145 mmol/L   Potassium 3.5 3.5 - 5.1 mmol/L   Chloride 106 98 - 111 mmol/L   CO2 24 22 - 32 mmol/L   Glucose, Bld 115 (H) 70 - 99 mg/dL    Comment: Glucose reference range applies only to samples taken after fasting for at least 8 hours.   BUN 25 (H) 8 - 23 mg/dL   Creatinine, Ser 1.61 (H) 0.44 - 1.00 mg/dL   Calcium 9.8 8.9 - 09.6 mg/dL   Total Protein  6.7 6.5 - 8.1 g/dL   Albumin 3.3 (L) 3.5 - 5.0 g/dL   AST 18 15 - 41 U/L   ALT 19 0 - 44 U/L   Alkaline Phosphatase 148 (H) 38 - 126 U/L   Total Bilirubin 0.8 0.3 - 1.2 mg/dL   GFR, Estimated 36 (L) >60 mL/min    Comment: (NOTE) Calculated using the CKD-EPI Creatinine Equation (2021)    Anion gap 9 5 - 15    Comment: Performed at [facility], [address], [place], [place] [address]  CBC     Status: Abnormal   Collection Time: 05/17/23  9:07 PM  Result Value Ref Range   WBC 5.3 4.0 - 10.5 K/uL   RBC 3.95 3.87 - 5.11 MIL/uL   Hemoglobin 11.3 (L) 12.0 - 15.0 g/dL   HCT 98.1 19.1 - 47.8 %   MCV 95.7 80.0 - 100.0 fL   MCH 28.6 26.0 - 34.0 pg   MCHC 29.9 (L) 30.0 - 36.0 g/dL   RDW 29.5 62.1 - 30.8 %   Platelets 156 150 - 400 K/uL   nRBC 0.0 0.0 - 0.2 %    Comment: Performed at [facility], [address], [place], [place] [address]  Ethanol     Status: None   Collection Time: 05/17/23  9:07 PM  Result Value Ref Range   Alcohol, Ethyl (B) <10 <10 mg/dL    Comment: (NOTE) Lowest detectable limit for serum alcohol is 10 mg/dL.  For medical purposes only. Performed at [facility], [address], [place], [place] [address]   Lactic acid, plasma     Status: None   Collection Time: 05/17/23  9:07 PM  Result Value Ref Range   Lactic Acid, Venous 1.4 0.5 - 1.9 mmol/L    Comment: Performed at [facility], [address] [place], [place] [address]  Protime-INR     Status: Abnormal   Collection Time: 05/17/23  9:07 PM  Result Value Ref Range   Prothrombin Time 17.4 (H) 11.4 - 15.2 seconds   INR 1.4 (H) 0.8 - 1.2    Comment: (NOTE) INR goal varies based on device and disease states. Performed at [facility], [address] [place], [place] [address]   Sample to Blood Bank     Status: None   Collection Time: 05/17/23  9:07 PM  Result Value Ref Range   Blood Bank Specimen SAMPLE AVAILABLE FOR TESTING    Sample Expiration       05/20/2023,2359 Performed at [facility], [address] [place], [place] [address]   I-stat chem 8, ed  Status: Abnormal   Collection Time: 05/17/23  9:15 PM  Result Value Ref Range   Sodium 139 135 - 145 mmol/L   Potassium 3.6 3.5 - 5.1 mmol/L   Chloride 107 98 - 111 mmol/L   BUN 27 (H) 8 - 23 mg/dL   Creatinine, Ser 9.52 (H) 0.44 - 1.00 mg/dL   Glucose, Bld 841 (H) 70 - 99 mg/dL    Comment: Glucose reference range applies only to samples taken after fasting for at least 8 hours.   Calcium, Ion 1.16 1.15 - 1.40 mmol/L   TCO2 23 22 - 32 mmol/L   Hemoglobin 12.6 12.0 - 15.0 g/dL   HCT 32.4 40.1 - 02.7 %  Urinalysis, Routine w reflex microscopic -Urine, Clean Catch     Status: Abnormal   Collection Time: 05/18/23  3:25 AM  Result Value Ref Range   Color, Urine AMBER (A) YELLOW    Comment: BIOCHEMICALS MAY BE AFFECTED BY COLOR   APPearance CLOUDY (A) CLEAR   Specific Gravity, Urine 1.015 1.005 - 1.030   pH 5.0 5.0 - 8.0   Glucose, UA NEGATIVE NEGATIVE mg/dL   Hgb urine dipstick MODERATE (A) NEGATIVE   Bilirubin Urine NEGATIVE NEGATIVE   Ketones, ur NEGATIVE NEGATIVE mg/dL   Protein, ur 253 (A) NEGATIVE mg/dL   Nitrite NEGATIVE NEGATIVE   Leukocytes,Ua LARGE (A) NEGATIVE   RBC / HPF 21-50 0 - 5 RBC/hpf   WBC, UA >50 0 - 5 WBC/hpf   Bacteria, UA FEW (A) NONE SEEN   Squamous Epithelial / HPF 0-5 0 - 5 /HPF   WBC Clumps PRESENT    Non Squamous Epithelial 0-5 (A) NONE SEEN    Comment: Performed at [facility], [address], [place], [place] [address]    CT Knee Left Wo Contrast  Result Date: 05/18/2023 CLINICAL DATA:  Fracture, knee patella and ?fem condyle fracture EXAM: CT OF THE LEFT KNEE WITHOUT CONTRAST TECHNIQUE: Multidetector CT imaging of the left knee was performed according to the standard protocol. Multiplanar CT image reconstructions were also generated. RADIATION DOSE REDUCTION: This exam was performed according to the departmental  dose-optimization program which includes automated exposure control, adjustment of the mA and/or kV according to patient size and/or use of iterative reconstruction technique. COMPARISON:  05/17/2023 FINDINGS: Bones/Joint/Cartilage There is a fracture through the lower pole of the left patella. Mildly displaced fracture fragments. Advanced tricompartment osteoarthritis with near complete joint space loss most pronounced in the medial compartment. Extensive osteophyte formation. No medial femoral condyle fracture. Moderate joint effusion. Ligaments Suboptimally assessed by CT. Muscles and Tendons Unremarkable Soft tissues Edema throughout the subcutaneous soft tissues IMPRESSION: Displaced fracture through the inferior pole of the left patella. No fracture through the medial femoral condyle. Advanced tricompartment degenerative changes. Moderate joint effusion. Electronically Signed   By: Charlett Nose M.D.   On: 05/18/2023 00:18   DG Hip Unilat W or Wo Pelvis 2-3 Views Left  Result Date: 05/17/2023 CLINICAL DATA:  Fall.  Left pain EXAM: DG HIP (WITH OR WITHOUT PELVIS) 2-3V LEFT COMPARISON:  None Available. FINDINGS: There is no evidence of hip fracture or dislocation. Hip joint space narrowing with marginal osteophytes suggesting mild-to-moderate osteoarthritis. IMPRESSION: 1. No fracture or dislocation. 2. Mild-to-moderate left hip osteoarthritis. Electronically Signed   By: Larose Hires D.O.   On: 05/17/2023 22:24   DG Knee Complete 4 Views Left  Result Date: 05/17/2023 CLINICAL DATA:  Level 2 fall on thinners. EXAM: LEFT KNEE - COMPLETE  4+ VIEW COMPARISON:  None Available. FINDINGS: There is comminuted fracture of the patella. There is also cortical irregularity of the medial femoral condyle concerning for an acute fracture. Evaluation is limited due to positioning and advanced osteoarthritis of the knee. Marked soft tissue swelling about the knee joint. IMPRESSION: 1. Comminuted fracture of the patella.  2. Cortical irregularity of the medial femoral condyle concerning for an acute fracture. Cross-sectional imaging for further evaluation is suggested. 3. Advanced tricompartmental knee osteoarthritis. Electronically Signed   By: Larose Hires D.O.   On: 05/17/2023 22:23   DG Ribs Unilateral W/Chest Left  Result Date: 05/17/2023 CLINICAL DATA:  Fall EXAM: LEFT RIBS AND CHEST - 3+ VIEW COMPARISON:  10/12/2021 FINDINGS: Left pacer remains in place, unchanged. Cardiomegaly, vascular congestion. Diffuse interstitial prominence, likely interstitial edema. No effusions or acute bony abnormality. No visible rib fracture. Previously seen distal left clavicle fracture on shoulder series not visualized on this study. IMPRESSION: Cardiomegaly, vascular congestion.  Suspect mild interstitial edema. No visible left rib fracture. Electronically Signed   By: Charlett Nose M.D.   On: 05/17/2023 22:21   DG Shoulder Left Port  Result Date: 05/17/2023 CLINICAL DATA:  Fall EXAM: LEFT SHOULDER COMPARISON:  None Available. FINDINGS: Lucency in the distal left clavicle concerning for distal clavicle fracture. AC and glenohumeral joint osteoarthritis with joint space narrowing and spurring. No subluxation or dislocation. Pacer partially visualized. IMPRESSION: Distal left clavicle fracture. Electronically Signed   By: Charlett Nose M.D.   On: 05/17/2023 22:19   CT CERVICAL SPINE WO CONTRAST  Result Date: 05/17/2023 CLINICAL DATA:  Polytrauma, blunt EXAM: CT CERVICAL SPINE WITHOUT CONTRAST TECHNIQUE: Multidetector CT imaging of the cervical spine was performed without intravenous contrast. Multiplanar CT image reconstructions were also generated. RADIATION DOSE REDUCTION: This exam was performed according to the departmental dose-optimization program which includes automated exposure control, adjustment of the mA and/or kV according to patient size and/or use of iterative reconstruction technique. COMPARISON:  None Available.  FINDINGS: Alignment: Slight anterolisthesis of C2 on C3 related to facet disease. Skull base and vertebrae: No acute fracture. No primary bone lesion or focal pathologic process. Soft tissues and spinal canal: No prevertebral fluid or swelling. No visible canal hematoma. Disc levels: Diffuse degenerative disc disease with disc space narrowing and spurring. Diffuse degenerative facet disease bilaterally. Upper chest: No acute findings Other: None IMPRESSION: Cervical spondylosis.  No acute bony abnormality. Electronically Signed   By: Charlett Nose M.D.   On: 05/17/2023 21:42   CT HEAD WO CONTRAST  Result Date: 05/17/2023 CLINICAL DATA:  Head trauma EXAM: CT HEAD WITHOUT CONTRAST TECHNIQUE: Contiguous axial images were obtained from the base of the skull through the vertex without intravenous contrast. RADIATION DOSE REDUCTION: This exam was performed according to the departmental dose-optimization program which includes automated exposure control, adjustment of the mA and/or kV according to patient size and/or use of iterative reconstruction technique. COMPARISON:  Head CT 07/01/2021 FINDINGS: Brain: No evidence of acute infarction, hemorrhage, hydrocephalus, extra-axial collection or mass lesion/mass effect. There is mild periventricular white matter hypodensity, likely chronic small vessel ischemic change. Vascular: Atherosclerotic calcifications are present within the cavernous internal carotid arteries. Skull: Normal. Negative for fracture or focal lesion. Sinuses/Orbits: No acute finding. Other: None. IMPRESSION: 1. No acute intracranial process. 2. Mild chronic small vessel ischemic change. Electronically Signed   By: Darliss Cheney M.D.   On: 05/17/2023 21:39    Review of Systems  Unable to perform ROS: Other   Blood  pressure 135/65, pulse 63, temperature 98.2 F (36.8 C), temperature source Oral, resp. rate 18, height 5\' 1"  (1.549 m), weight 92 kg, SpO2 98%. Physical Exam Constitutional:       General: She is not in acute distress.    Appearance: She is well-developed. She is not diaphoretic.  HENT:     Head: Normocephalic and atraumatic.  Eyes:     General: No scleral icterus.       Right eye: No discharge.        Left eye: No discharge.     Conjunctiva/sclera: Conjunctivae normal.  Cardiovascular:     Rate and Rhythm: Normal rate and regular rhythm.  Pulmonary:     Effort: Pulmonary effort is normal. No respiratory distress.  Musculoskeletal:     Cervical back: Normal range of motion.     Comments: Left shoulder, elbow, wrist, digits- no skin wounds, sling in place, no instability, no blocks to motion  Sens  Ax/R/M/U intact  Mot   Ax/ R/ PIN/ M/ AIN/ U intact  Rad 2+  LLE No traumatic wounds, ecchymosis, or rash  KI in place, mod TTP knee, severe leg edema  Sens DPN, SPN, TN intact  Motor EHL, ext, flex, evers 5/5  DP 2+, PT 0  Skin:    General: Skin is warm and dry.  Neurological:     Mental Status: She is alert.  Psychiatric:        Mood and Affect: Mood normal.        Behavior: Behavior normal.     Assessment/Plan: Left clavicle fx -- Plan non-operative management with sling, NWB. Left patella fx -- Fx is not badly displaced. Given her non-ambulatory status and body habitus will opt to treat this non-operatively. She may transfer wbat on the R and NWB on the L in [place]. F/u with Dr. Victorino Dike in 2 weeks.    Freeman Caldron, PA-C Orthopedic Surgery [phone] 05/18/2023, 10:34 AM   Addendum:  I have seen the patient, taken the history and performed a physical examination.  I agree with the note documented above.    In summary, the patient is a 77 y/o female with complex PMH.  SHe fell yesterday while transferring from wheelchair to electric scooter injuring her left shoulder and left knee.  Her son is at the bedside and interprets for [place].  She c/o pain in the left knee and left shoulder.  Aching at rest and more sharp with motion.  No PT yet.  SHe is  ambulatory at home only for transfers and mostly uses a wheelchair.  L LE in knee immobilizer.  L UE in sling.    A:  left distal clavicle fracture.  NWB L UE in a sling.  L patella fracture - the patient is minimally ambulatory and has significant risks for surgical management of the patella fracture.  She will be immobilized in a knee immobilizer.  NWB on the L LE.  PT for transfers.  Follow up with me in the office in two weeks.

## 2023-05-18 NOTE — ED Notes (Signed)
Trauma Response Nurse Documentation   Katelyn Jackson is a 77 y.o. female arriving to [facility] ED via EMS  On Eliquis (apixaban) daily. Trauma was activated as a Level 2 by ED charge RN based on the following trauma criteria Elderly patients > 65 with head trauma on anti-coagulation (excluding ASA).  Patient cleared for CT by Solon Augusta PA. Pt transported to CT with trauma response nurse present to monitor. RN remained with the patient throughout their absence from the department for clinical observation.   GCS 15.  History   Past Medical History:  Diagnosis Date   Arthritis    Breast lesion 12/07/2021   Cancer [facility])    breast cancer   Chronic atrial fibrillation (HCC)    Chronic kidney disease    sees Dr Lowell Guitar   Colonization with VRE (vancomycin-resistant enterococcus) 09/22/2021   Cramps, muscle, general    Dry skin 09/22/2021   Dyspnea on exertion    Dysrhythmia    a-fib,    ESBL E. coli carrier 09/22/2021   GERD (gastroesophageal reflux disease)    Headache    Herpes labialis 08/19/2021   Hyperlipidemia    Hypertension    Hypothyroidism    Lymphedema    Moderate to severe pulmonary hypertension (HCC)    Obesity    Osteoarthritis of right knee 08/19/2021   Personal history of radiation therapy    Pneumonia 12/2020   Presence of permanent cardiac pacemaker 01/21/2021   for bradycardia    Syncope 12/2020   needed a pacemaker     Past Surgical History:  Procedure Laterality Date   APPENDECTOMY     BREAST BIOPSY Left 2018   BREAST LUMPECTOMY Left 04/04/2017   x3   BREAST LUMPECTOMY WITH NEEDLE LOCALIZATION AND AXILLARY SENTINEL LYMPH NODE BX Left 04/04/2017   Procedure: BREAST LUMPECTOMY WITH NEEDLE LOCALIZATION x3 AND AXILLARY SENTINEL LYMPH NODE BX;  Surgeon: Almond Lint, MD;  Location: [facility] OR;  Service: General;  Laterality: Left;   CATARACT EXTRACTION W/ INTRAOCULAR LENS  IMPLANT, BILATERAL Bilateral 2018   COLONOSCOPY     EYE SURGERY     GLAUCOMA  SURGERY Bilateral 2018   HARDWARE REMOVAL Right 07/26/2021   Procedure: RIGHT SHOULDER HARDWARE REMOVAL WITH WASHOUT;  Surgeon: Bjorn Pippin, MD;  [facility]: [facility];  Service: Orthopedics;  Laterality: Right;   INSERT / REPLACE / REMOVE PACEMAKER  01/21/2021   IR FLUORO GUIDE CV LINE LEFT  07/29/2021   IR REMOVAL TUN CV CATH W/O FL  09/07/2021   IR US GUIDE BX ASP/DRAIN  07/16/2021   RE-EXCISION OF BREAST CANCER,SUPERIOR MARGINS Left 04/26/2017   Procedure: RE-EXCISION OF LEFT BREAST CANCER;  Surgeon: Almond Lint, MD;  Location: [facility] OR;  Service: General;  Laterality: Left;   SHOULDER HEMI-ARTHROPLASTY Right 03/17/2021   Procedure: SHOULDER HEMI-ARTHROPLASTY;  Surgeon: Bjorn Pippin, MD;  Location: [facility];  Service: Orthopedics;  Laterality: Right;       Initial Focused Assessment (If applicable, or please see trauma documentation): Alert/oriented female presents via EMS from home after a fall, states her walker then fell on top of her. Reports dizziness at the time of fall. C/o pain to left shoulder, left hip and left knee. EMS reports head injury, no obvious trauma noted on primary/secondary exam  Airway patent/unobstructed, BS diminished/clear No obvious uncontrolled hemorrhage GCS 15 PERRLA 2mm  CT's Completed:   CT Head and CT C-Spine CT left knee  Interventions:  IV start and trauma lab draw Portable chest/left ribs,  pelvis/left hip, left knee, left shoulder EKG Fentanyl/tylenol for pain control Knee immobilizer Shoulder immobilizer  Plan for disposition:  Admission to floor   Consults completed:  Hospitalist at 0513.  Event Summary: Presents via EMS from home for a ground level fall, on eliquis with head injury. No obvious trauma noted on external exam, imaging with left clavicle fx and left patella fx.    Bedside handoff with ED RN Whitney.    [name] O Renda Pohlman  Trauma Response RN  Please call TRN at [phone] for further assistance.

## 2023-05-18 NOTE — H&P (Signed)
History and Physical    Patient: Katelyn Jackson ZOX:[id number] DOB: 1946-10-17 DOA: 05/17/2023 DOS: the patient was seen and examined on 05/18/2023 PCP: Plotnikov, Georgina Quint, MD  Patient coming from: Home via EMS  Chief Complaint:  Chief Complaint  Patient presents with   Fall   HPI: Katelyn Jackson is a 77 y.o. female with medical history significant of hypertension, hyperlipidemia, chronic atrial fibrillation, s/p permanent pacemaker, hypothyroidism, CKD stage 3, lymphedema and history of multidrug-resistant bacteria who presents after a fall.  History is obtained with use of with use of the iPad interpreter services. At baseline patient is wheelchair-bound, but has been able to transfer without assistance.  She reports that yesterday she ate gone to the restroom and while trying to transfer from the toilet seat back to her wheelchair got dizzy and fell.  She thinks that she hit her head and may have lost consciousness briefly, but is not sure.  She landed on her left side and reported immediate pain in her left knee and shoulder.  She reported having some nausea after receiving pain meds, but denied having any abdominal pain or dysuria symptoms prior to coming to the hospital.  In the emergency department patient was noted to initially be afebrile with pulse 48-63, respiration 14-26 and all other signs maintained.  Labs from 7/10 noted hemoglobin 11.3, BUN 25, creatinine 1.48, alkaline phosphatase 148, and INR 1.4.  X-rays of the left knee noted a communicated fracture of the patella with x-rays of the shoulder noting a distal clavicle fracture.  Chest x-ray noted cardiomegaly with vascular congestion. Urinalysis noted moderate hemoglobin, large leukocytes, few bacteria, 21-50 RBC/hpf,  and greater than 50 WBCs.  CT scan of the left knee noted displaced fracture through the inferior pole of the left patella with moderate joint effusion.  Patient has been given fentanyl IV.  Orthopedics have  been consulted.   Patient was placed in a left knee immobilizer as well as sling for the left arm.    Review of Systems: As mentioned in the history of present illness. All other systems reviewed and are negative. Past Medical History:  Diagnosis Date   Arthritis    Breast lesion 12/07/2021   Cancer [facility])    breast cancer   Chronic atrial fibrillation (HCC)    Chronic kidney disease    sees Dr Lowell Guitar   Colonization with VRE (vancomycin-resistant enterococcus) 09/22/2021   Cramps, muscle, general    Dry skin 09/22/2021   Dyspnea on exertion    Dysrhythmia    a-fib,    ESBL E. coli carrier 09/22/2021   GERD (gastroesophageal reflux disease)    Headache    Herpes labialis 08/19/2021   Hyperlipidemia    Hypertension    Hypothyroidism    Lymphedema    Moderate to severe pulmonary hypertension (HCC)    Obesity    Osteoarthritis of right knee 08/19/2021   Personal history of radiation therapy    Pneumonia 12/2020   Presence of permanent cardiac pacemaker 01/21/2021   for bradycardia    Syncope 12/2020   needed a pacemaker   Past Surgical History:  Procedure Laterality Date   APPENDECTOMY     BREAST BIOPSY Left 2018   BREAST LUMPECTOMY Left 04/04/2017   x3   BREAST LUMPECTOMY WITH NEEDLE LOCALIZATION AND AXILLARY SENTINEL LYMPH NODE BX Left 04/04/2017   Procedure: BREAST LUMPECTOMY WITH NEEDLE LOCALIZATION x3 AND AXILLARY SENTINEL LYMPH NODE BX;  Surgeon: Almond Lint, MD;  Location: [facility] OR;  Service: General;  Laterality: Left;   CATARACT EXTRACTION W/ INTRAOCULAR LENS  IMPLANT, BILATERAL Bilateral 2018   COLONOSCOPY     EYE SURGERY     GLAUCOMA SURGERY Bilateral 2018   HARDWARE REMOVAL Right 07/26/2021   Procedure: RIGHT SHOULDER HARDWARE REMOVAL WITH WASHOUT;  Surgeon: Bjorn Pippin, MD;  Location: [facility] ORS;  Service: Orthopedics;  Laterality: Right;   INSERT / REPLACE / REMOVE PACEMAKER  01/21/2021   IR FLUORO GUIDE CV LINE LEFT  07/29/2021   IR REMOVAL TUN CV CATH W/O FL   09/07/2021   IR US GUIDE BX ASP/DRAIN  07/16/2021   RE-EXCISION OF BREAST CANCER,SUPERIOR MARGINS Left 04/26/2017   Procedure: RE-EXCISION OF LEFT BREAST CANCER;  Surgeon: Almond Lint, MD;  Location: [facility] OR;  Service: General;  Laterality: Left;   SHOULDER HEMI-ARTHROPLASTY Right 03/17/2021   Procedure: SHOULDER HEMI-ARTHROPLASTY;  Surgeon: Bjorn Pippin, MD;  Location: [facility] OR[facility];  Service: Orthopedics;  Laterality: Right;   Social History:  reports that she has never smoked. She has never used smokeless tobacco. She reports that she does not drink alcohol and does not use drugs.  Allergies  Allergen Reactions   Chlorhexidine Gluconate Hives   Penicillins Itching    Has patient had a PCN reaction causing immediate rash, facial/tongue/throat swelling, SOB or lightheadedness with hypotension: no Has patient had a PCN reaction causing severe rash involving mucus membranes or skin necrosis: No Has patient had a PCN reaction that required hospitalization: No Has patient had a PCN reaction occurring within the last 10 years: No If all of the above answers are "NO", then may proceed with Cephalosporin use. Tolerated Cephalosporin Date: 03/17/21. Td Ancef 2g 07/26/21    Family History  Problem Relation Age of Onset   CAD Mother 44   Stroke Mother 50       hemorr CVA   COPD Father 75   Cancer Neg Hx     Prior to Admission medications   Medication Sig Start Date End Date Taking? Authorizing Provider  acetaminophen (TYLENOL) 325 MG tablet Take 2 tablets (650 mg total) by mouth every 6 (six) hours as needed for mild pain or moderate pain (or Fever >/= 101). 07/30/21   Hongalgi, Maximino Greenland, MD  allopurinol (ZYLOPRIM) 100 MG tablet Take 0.5 tablets (50 mg total) by mouth daily. 11/23/22   Plotnikov, Georgina Quint, MD  apixaban (ELIQUIS) 5 MG TABS tablet Take 1 tablet (5 mg total) by mouth 2 (two) times daily. 11/23/22   Plotnikov, Georgina Quint, MD  bisacodyl (DULCOLAX) 10 MG suppository Place 10 mg rectally daily  as needed for moderate constipation.    [provider]  carvedilol (COREG) 6.25 MG tablet Take 1 tablet (6.25 mg total) by mouth 2 (two) times daily. 11/23/22 11/23/23  Plotnikov, Georgina Quint, MD  Cholecalciferol (VITAMIN D3) 50 MCG (2000 UT) capsule Take 1 capsule (2,000 Units total) by mouth daily. 11/25/21   Plotnikov, Georgina Quint, MD  clotrimazole-betamethasone (LOTRISONE) cream Apply 1 Application topically 2 (two) times daily. 04/17/23   Plotnikov, Georgina Quint, MD  Control Gel Formula Dressing (DUODERM CGF DRESSING) MISC Apply 1 each topically once a week. On both thighs 04/17/23   Plotnikov, Georgina Quint, MD  Cyanocobalamin (VITAMIN B-12 PO) Take 2,000 mcg by mouth daily.    [provider]  diclofenac Sodium (VOLTAREN) 1 % GEL APPLY FOUR GRAMS FOUR TIMES DAILY AS NEEDED 12/22/22   Plotnikov, Georgina Quint, MD  docusate sodium (COLACE) 100 MG capsule Take 1 capsule (100 mg total)  by mouth 2 (two) times daily. 11/25/21   Plotnikov, Georgina Quint, MD  lactulose (CHRONULAC) 10 GM/15ML solution TAKE 15 MILLILITERS BY MOUTH THREE TIMESDAILY 02/10/23   Plotnikov, Georgina Quint, MD  levothyroxine (SYNTHROID) 50 MCG tablet Take 1 tablet (50 mcg total) by mouth daily before breakfast. Overdue for Annual appt must see provider for future refills 11/23/22   Plotnikov, Georgina Quint, MD  lipase/protease/amylase (CREON) 12000-38000 units CPEP capsule Take 1 capsule (12,000 Units total) by mouth daily as needed (stomach problems). 11/25/21   Plotnikov, Georgina Quint, MD  magnesium oxide (MAG-OX) 400 MG tablet Take 1 tablet (400 mg total) by mouth 2 (two) times daily. 09/15/22   Plotnikov, Georgina Quint, MD  omeprazole (PRILOSEC) 40 MG capsule TAKE ONE CAPSULE BY MOUTH DAILY 08/16/22   Plotnikov, Georgina Quint, MD  OVER THE COUNTER MEDICATION Apply 1 application topically daily as needed (apply to buttocks  as needed for irritaiton). Intensive Skin Care Therapy    [provider]  polyethylene glycol (MIRALAX / GLYCOLAX) 17 g  packet Take 17 g by mouth 2 (two) times daily.    [provider]  polyvinyl alcohol (LIQUIFILM TEARS) 1.4 % ophthalmic solution Place 1 drop into both eyes as needed for dry eyes. 07/30/21   Hongalgi, Maximino Greenland, MD  potassium chloride (KLOR-CON) 8 MEQ tablet TAKE ONE (1) TABLET BY MOUTH EVERY DAY 11/30/22   Plotnikov, Georgina Quint, MD  pravastatin (PRAVACHOL) 20 MG tablet Take 1 tablet (20 mg total) by mouth daily at 6 PM. 11/25/21   Plotnikov, Georgina Quint, MD  rifaximin (XIFAXAN) 550 MG TABS tablet Take 1 tablet (550 mg total) by mouth 2 (two) times daily. 06/16/22   Plotnikov, Georgina Quint, MD  sennosides-docusate sodium (SENOKOT-S) 8.6-50 MG tablet Take 1 tablet by mouth daily. 11/25/21   Plotnikov, Georgina Quint, MD  simethicone (MYLICON) 40 MG/0.6ML drops Take 0.3 mLs (20 mg total) by mouth 4 (four) times daily as needed for flatulence. 07/30/21   Hongalgi, Maximino Greenland, MD  tamoxifen (NOLVADEX) 20 MG tablet Take 1 tablet (20 mg total) by mouth daily. Will take after finishing Exemestane 11/25/21   Plotnikov, Georgina Quint, MD  torsemide (DEMADEX) 100 MG tablet Take 1 tablet (100 mg total) by mouth 2 (two) times daily. 11/25/21   Plotnikov, Georgina Quint, MD  traMADol (ULTRAM) 50 MG tablet TAKE ONE (1) TABLET BY MOUTH EVERY 6 HOURS AS NEEDED 05/09/23   Plotnikov, Georgina Quint, MD  triamcinolone cream (KENALOG) 0.1 % Apply topically 2 (two) times daily. 02/01/23   Plotnikov, Georgina Quint, MD  trolamine salicylate (BLUE-EMU MAXIMUM PAIN RELIEF) 10 % cream Apply 1 Application topically as needed for muscle pain. 09/15/22   Plotnikov, Georgina Quint, MD    Physical Exam: Vitals:   05/18/23 0630 05/18/23 0645 05/18/23 0700 05/18/23 0715  BP: (!) 130/56 (!) 120/56 (!) 112/55 120/61  Pulse: (!) 59 61 61 63  Resp: 16 17 16 19   Temp:      TempSrc:      SpO2: 98% 97% 96% 97%  Weight:      Height:       Constitutional: Elderly female who appears to be in no acute distress at this time. Eyes: PERRL, lids and conjunctivae normal ENMT:  Mucous membranes are moist.   Neck: normal, supple,  Respiratory: clear to auscultation bilaterally, no wheezing, no crackles. Normal respiratory effort. No accessory muscle use.  Cardiovascular: Regular rate and rhythm, no murmurs / rubs / gallops.  Pacemaker in the left upper  chest wall.  Lower extremity edema.  2+ pedal pulses. No carotid bruits.  Abdomen: no tenderness, no masses palpated.   Bowel sounds positive.  Musculoskeletal: no clubbing / cyanosis.  Healed surgical scar over the right shoulder.  Left leg in knee immobilizer. Skin: no rashes, lesions, ulcers. No induration Neurologic: CN 2-12 grossly intact.  Able to move all extremities.   Psychiatric: Normal judgment and insight. Alert and oriented x 3. Normal mood.   Data Reviewed:  EKG revealed a paced rhythm at 60 bpm.  Reviewed labs, imaging, and pertinent records as documented  Assessment and Plan:  Communicated fracture of the patella and distal clavicle fracture secondary to fall Patient presents after having a fall while trying to transfer from commode to her wheelchair.  Found to have communicated fracture of the patella and left distal clavicle fracture.  Orthopedics consulted, but had recommended nonoperative management and pain control. -Admit to MedSurg bed observation -Hydrocodone/Fentanyl IV needed for moderate to severe pain respectively -Transitions of care consulted for possible need placement -Orthopedics consulted,  will follow-up for any further recommendations  Abnormal urinalysis Urinalysis noted moderate hemoglobin, large leukocytes, few bacteria, 21-50 RBC/hpf,  and greater than 50 WBCs.  Last available urine culture from 08/2021 noted 50,000 colonies of vancomycin resistant Enterococcus. -Check urine culture  Chronic atrial fibrillation on chronic anticoagulation S/p permanent pacemaker Patient noted to be in a paced rhythm. -Continue Eliquis   Essential hypertension Blood pressures range from  114/56-172/83 -Continue Coreg as tolerated with holding parameters.  CKD stage IIIb Chronic.  Creatinine 1.48 with BUN 25 which appears around patient's baseline. -Continue to monitor  Hypothyroidism -Continue levothyroxine  History of breast cancer -Continue tamoxifen  History of gout -Continue allopurinol  DVT prophylaxis: Eliquis Advance Care Planning:   Code Status: Full Code    Consults: Orthopedics Family Communication: none  Severity of Illness: The appropriate patient status for this patient is OBSERVATION. Observation status is judged to be reasonable and necessary in order to provide the required intensity of service to ensure the patient's safety. The patient's presenting symptoms, physical exam findings, and initial radiographic and laboratory data in the context of their medical condition is felt to place them at decreased risk for further clinical deterioration. Furthermore, it is anticipated that the patient will be medically stable for discharge from the hospital within 2 midnights of admission.   Author: Clydie Braun, MD 05/18/2023 8:14 AM  For on call review [email or web address].

## 2023-05-18 NOTE — ED Provider Notes (Signed)
  Physical Exam  BP (!) 147/75   Pulse (!) 52   Temp 98.3 F (36.8 C) (Oral)   Resp 15   LMP  (LMP Unknown)   SpO2 97%   Physical Exam  Procedures  Procedures  ED Course / MDM   Clinical Course as of 05/18/23 0534  Thu May 18, 2023  0015 Consult to Dr. Victorino Dike, orthopedics, who recommends knee immobilizer and outpatient follow up if no other medical indication for admission. I appreciate his collaboration in the care of this patient.  [RS]  0529 Patient admitted to Dr. Margo Aye, hospitalist [RS]    Clinical Course User Index [RS] Robi Mitter, Eugene Gavia, PA-C   Medical Decision Making Amount and/or Complexity of Data Reviewed Labs: ordered. Radiology: ordered.  Risk OTC drugs. Prescription drug management.    Discussion conducted with the assistance of [place] interpreter [phone]  Care of this patient assumed from preceding ED provider Solon Augusta, PA-C at time of shift change.  Please see his associated note for further insight and the patient ED course.  In brief patient is a 77 year old female primarily Russian-speaking who presents to the ED for evaluation after fall on thinners.  She is anticoagulated with Xarelto for A-fib.  Nonambulatory but transfers, fell between her wheelchair and other chair today, left leg pain.  Left distal clavicular fracture, comminuted left patellar fracture  CT head and C-spine unremarkable as of the last.  Normal neurologic exam..  CT  pending to r/o femoral condyle fx.  CT knee negative for condylar fracture.   Consult to ortho as above. Patient lives with her son, however he and his spouse both work full time. She does not feel she would be able to navigate her home safely given her inability to bear weight on the left leg or use the left arm at this time. Will plan for admission.    Consutl to hosptilaist as above, appreciate the collaboration in the care of this patient.   Kenzy voiced understanding of her medical evaluation and  treatment plan. Each of their questions answered to their expressed satisfaction.  She is amenable to plan for admission at this time.   This chart was dictated using voice recognition software, Dragon. Despite the best efforts of this provider to proofread and correct errors, errors may still occur which can change documentation meaning.      Paris Lore, PA-C 05/18/23 0534    Shon Baton, MD 05/19/23 [phone]

## 2023-05-19 DIAGNOSIS — S42002A Fracture of unspecified part of left clavicle, initial encounter for closed fracture: Secondary | ICD-10-CM | POA: Diagnosis not present

## 2023-05-19 DIAGNOSIS — W19XXXA Unspecified fall, initial encounter: Secondary | ICD-10-CM | POA: Diagnosis not present

## 2023-05-19 DIAGNOSIS — S82002A Unspecified fracture of left patella, initial encounter for closed fracture: Secondary | ICD-10-CM | POA: Diagnosis not present

## 2023-05-19 LAB — CBC
HCT: 33.6 % — ABNORMAL LOW (ref 36.0–46.0)
Hemoglobin: 10.1 g/dL — ABNORMAL LOW (ref 12.0–15.0)
MCH: 29.2 pg (ref 26.0–34.0)
MCHC: 30.1 g/dL (ref 30.0–36.0)
MCV: 97.1 fL (ref 80.0–100.0)
Platelets: 132 10*3/uL — ABNORMAL LOW (ref 150–400)
RBC: 3.46 MIL/uL — ABNORMAL LOW (ref 3.87–5.11)
RDW: 15.4 % (ref 11.5–15.5)
WBC: 3.8 10*3/uL — ABNORMAL LOW (ref 4.0–10.5)
nRBC: 0 % (ref 0.0–0.2)

## 2023-05-19 LAB — URINE CULTURE: Culture: <10000 — AB

## 2023-05-19 LAB — BASIC METABOLIC PANEL
Anion gap: 5 (ref 5–15)
BUN: 20 mg/dL (ref 8–23)
CO2: 24 mmol/L (ref 22–32)
Calcium: 9.2 mg/dL (ref 8.9–10.3)
Chloride: 109 mmol/L (ref 98–111)
Creatinine, Ser: 1.27 mg/dL — ABNORMAL HIGH (ref 0.44–1.00)
GFR, Estimated: 44 mL/min — ABNORMAL LOW (ref >60)
Glucose, Bld: 81 mg/dL (ref 70–99)
Potassium: 4.1 mmol/L (ref 3.5–5.1)
Sodium: 138 mmol/L (ref 135–145)

## 2023-05-19 MED ORDER — TORSEMIDE 20 MG PO TABS: 100.0000 mg | ORAL_TABLET | Freq: Two times a day (BID) | ORAL | Status: DC

## 2023-05-19 MED ORDER — TORSEMIDE 20 MG PO TABS
40.0000 mg | ORAL_TABLET | Freq: Every morning | ORAL | Status: DC
  Administered 2023-05-20 – 2023-05-24 (×5): 40 mg via ORAL
  Filled 2023-05-19 (×6): qty 2

## 2023-05-19 MED ORDER — RIFAXIMIN 550 MG PO TABS
550.0000 mg | ORAL_TABLET | Freq: Two times a day (BID) | ORAL | Status: DC
  Administered 2023-05-19 – 2023-05-24 (×11): 550 mg via ORAL
  Filled 2023-05-19 (×13): qty 1

## 2023-05-19 MED ORDER — LACTULOSE 10 GM/15ML PO SOLN
15.0000 g | Freq: Three times a day (TID) | ORAL | Status: DC
  Administered 2023-05-19 – 2023-05-20 (×3): 15 g via ORAL
  Filled 2023-05-19 (×4): qty 30

## 2023-05-19 MED ORDER — CARVEDILOL 6.25 MG PO TABS
6.2500 mg | ORAL_TABLET | Freq: Two times a day (BID) | ORAL | Status: DC
  Administered 2023-05-19: 6.25 mg via ORAL
  Filled 2023-05-19: qty 1

## 2023-05-19 MED ORDER — CARVEDILOL 12.5 MG PO TABS
12.5000 mg | ORAL_TABLET | Freq: Two times a day (BID) | ORAL | Status: DC
  Administered 2023-05-20 – 2023-05-24 (×7): 12.5 mg via ORAL
  Filled 2023-05-19 (×9): qty 1

## 2023-05-19 MED ORDER — HYDROCODONE-ACETAMINOPHEN 5-325 MG PO TABS: 1.0000 | ORAL_TABLET | ORAL | 0 refills | Status: AC | PRN

## 2023-05-19 MED ORDER — TORSEMIDE 20 MG PO TABS
20.0000 mg | ORAL_TABLET | Freq: Every evening | ORAL | Status: DC
  Administered 2023-05-19 – 2023-05-23 (×5): 20 mg via ORAL
  Filled 2023-05-19 (×5): qty 1

## 2023-05-19 MED ORDER — PANCRELIPASE (LIP-PROT-AMYL) 12000-38000 UNITS PO CPEP
12000.0000 [IU] | ORAL_CAPSULE | Freq: Every day | ORAL | Status: DC | PRN
  Administered 2023-05-19: 12000 [IU] via ORAL
  Filled 2023-05-19: qty 1

## 2023-05-19 MED ORDER — DOCUSATE SODIUM 100 MG PO CAPS
100.0000 mg | ORAL_CAPSULE | Freq: Two times a day (BID) | ORAL | Status: DC
  Administered 2023-05-19 – 2023-05-24 (×9): 100 mg via ORAL
  Filled 2023-05-19 (×10): qty 1

## 2023-05-19 MED ORDER — PRAVASTATIN SODIUM 40 MG PO TABS
20.0000 mg | ORAL_TABLET | Freq: Every day | ORAL | Status: DC
  Administered 2023-05-19 – 2023-05-23 (×5): 20 mg via ORAL
  Filled 2023-05-19 (×5): qty 1

## 2023-05-19 NOTE — Telephone Encounter (Signed)
Notified Heather w/MD response../lmb 

## 2023-05-19 NOTE — Progress Notes (Signed)
Progress Note   Patient: Katelyn Jackson YQM:[id number] DOB: Nov 28, 1945 DOA: 05/17/2023     0 DOS: the patient was seen and examined on 05/19/2023   Brief hospital course: 77 y.o. female with medical history significant of hypertension, hyperlipidemia, chronic atrial fibrillation, s/p permanent pacemaker, hypothyroidism, CKD stage 3, lymphedema and history of multidrug-resistant bacteria who presents after a fall.  History is obtained with use of with use of the iPad interpreter services. At baseline patient is wheelchair-bound, but has been able to transfer without assistance.  She reports that yesterday she ate gone to the restroom and while trying to transfer from the toilet seat back to her wheelchair got dizzy and fell.  She thinks that she hit her head and may have lost consciousness briefly, but is not sure.  She landed on her left side and reported immediate pain in her left knee and shoulder.  She reported having some nausea after receiving pain meds, but denied having any abdominal pain or dysuria symptoms prior to coming to the hospital.   In the emergency department patient was noted to initially be afebrile with pulse 48-63, respiration 14-26 and all other signs maintained.  Labs from 7/10 noted hemoglobin 11.3, BUN 25, creatinine 1.48, alkaline phosphatase 148, and INR 1.4.  X-rays of the left knee noted a communicated fracture of the patella with x-rays of the shoulder noting a distal clavicle fracture.  Chest x-ray noted cardiomegaly with vascular congestion. Urinalysis noted moderate hemoglobin, large leukocytes, few bacteria, 21-50 RBC/hpf,  and greater than 50 WBCs.  CT scan of the left knee noted displaced fracture through the inferior pole of the left patella with moderate joint effusion.  Patient has been given fentanyl IV.  Orthopedics have been consulted.   Patient was placed in a left knee immobilizer as well as sling for the left arm.  Assessment and Plan: Communicated fracture  of the patella and distal clavicle fracture secondary to fall Patient presents after having a fall while trying to transfer from commode to her wheelchair.  Found to have communicated fracture of the patella and left distal clavicle fracture.   -Orthopedics consulted, but had recommended nonoperative management and pain control. -Transitions of care consulted for possible need placement   Abnormal urinalysis Urinalysis noted moderate hemoglobin, large leukocytes, few bacteria, 21-50 RBC/hpf,  and greater than 50 WBCs.  Last available urine culture from 08/2021 noted 50,000 colonies of vancomycin resistant Enterococcus. -patient is asymptomatic from UTI standpoint. Discussed with ID on call, recommends no treatment unless symptomatic   Chronic atrial fibrillation on chronic anticoagulation S/p permanent pacemaker Patient noted to be in a paced rhythm. -Continue Eliquis    Essential hypertension Blood pressures range from 114/56-172/83 -Continue Coreg as tolerated with holding parameters.   CKD stage IIIb Chronic.  Creatinine 1.48 with BUN 25 which appears around patient's baseline. -Continue to monitor   Hypothyroidism -Continue levothyroxine   History of breast cancer -Continue tamoxifen   History of gout -Continue allopurinol       Subjective: Without complaints this AM. Denies dysuria, nausea, or abd discomfort  Physical Exam: Vitals:   05/18/23 1640 05/18/23 2030 05/19/23 0403 05/19/23 0802  BP: (!) 135/54 (!) 159/78 (!) 150/71 (!) 146/58  Pulse: 64 61 61 (!) 57  Resp: 18 17 15 18   Temp: 98.8 F (37.1 C) 98.7 F (37.1 C) 98.5 F (36.9 C) 98.6 F (37 C)  TempSrc: Oral Oral Oral   SpO2: 97% 100% 96% 95%  Weight:  Height:       General exam: Awake, laying in bed, in nad Respiratory system: Normal respiratory effort, no wheezing Cardiovascular system: regular rate, s1, s2 Gastrointestinal system: Soft, nondistended, positive BS Central nervous system: CN2-12  grossly intact, strength intact Extremities: Perfused, no clubbing Skin: Normal skin turgor, no notable skin lesions seen Psychiatry: Mood normal // no visual hallucinations   Data Reviewed:  Labs reviewed: Na 138, K 4.1, Cr 1.27  Family Communication: pt in room, family not at bedside  Disposition: Status is: Observation The patient remains OBS appropriate and will d/c before 2 midnights.  Planned Discharge Destination: Skilled nursing facility    Author: Rickey Barbara, MD 05/19/2023 6:27 PM  For on call review [email or web address].

## 2023-05-19 NOTE — Consult Note (Addendum)
WOC Nurse Consult Note: Reason for Consult: Consult requested for multiple wounds to buttocks/posterior thighs.  Wound type: Bilat buttocks without wounds.  Sacrum/inner gluteal cleft with Stage 2 pressure injury, 5X2X.1cm, mod amt pink drainage. Pt has some full thickness wounds which are red and moist with mod amt pink drainage; appearance is consistent with wounds related to moisture associated skin damage, some are NOT pressure injuries, unless noted otherwise.  ICD-10 CM Codes for Irritant Dermatitis L24A2 - Due to fecal, urinary or dual incontinence Left upper posterior thigh: 4X4X.2cm Stage 3  Left lower posterior thigh 2X2X.1cm Right upper posterior thigh 1X1X.1cm Right lower posterior thigh 2X2X.2cm Stage 3 Pressure Injury POA: Yes Dressing procedure/placement/frequency: It is difficult and painful for patient to reposition related to fractured patella and clavicle; wounds are frequently moist related to urinary incontinence; Purewick in place to attempt to contain urine but locations are in close proximity to the wounds. Air mattress ordered to reduce pressure and increase airflow and decrease discomfort.  Topical treatment orders provided for bedside nurses to perform as follows to protect from further injury: Foam dressing to sacrum/gluteal fold, bilat posterior upper thighs.  Change foam dressings Q 3 days or PRN soiling.  Please re-consult if further assistance is needed.  Thank-you,  Cammie Mcgee MSN, RN, CWOCN, [place], CNS [phone]

## 2023-05-19 NOTE — Telephone Encounter (Signed)
OK. Thx

## 2023-05-19 NOTE — Evaluation (Signed)
Occupational Therapy Evaluation Patient Details Name: Katelyn Jackson MRN: [id number] DOB: [birth year] Today's Date: 05/19/2023   History of Present Illness 77 y.o. female who presents after a fall at home. CT and Xray show a L distal clavicle fx and a L patella fx. Past medical history significant of hypertension, hyperlipidemia, chronic atrial fibrillation, s/p permanent pacemaker, hypothyroidism, CKD stage 3, lymphedema and history of multidrug-resistant bacteria   Clinical Impression   Pt currently presenting as bed level and has multiple extremity restrictions that make progressing functional mobility a challenge. Pt currently total A for ADLs and cannot complete mobility to perform her baseline pivots. Pt will continue to be followed acutely to continue to reinforce ROM and educate pt on sling and other things to promote positive healing of LUE and LLE, may only utilize a couple of sessions with patient to reinforce education. Patient would benefit from post acute skilled rehab facility with <3 hours of therapy and 24/7 support       Recommendations for follow up therapy are one component of a multi-disciplinary discharge planning process, led by the attending physician.  Recommendations may be updated based on patient status, additional functional criteria and insurance authorization.   Assistance Recommended at Discharge Frequent or constant Supervision/Assistance  Patient can return home with the following A lot of help with walking and/or transfers;Two people to help with bathing/dressing/bathroom;Assistance with cooking/housework;Assist for transportation;Assistance with feeding;Help with stairs or ramp for entrance    Functional Status Assessment  Patient has had a recent decline in their functional status and demonstrates the ability to make significant improvements in function in a reasonable and predictable amount of time.  Equipment Recommendations  Other (comment) (defer to  next level of care)    Recommendations for Other Services       Precautions / Restrictions Precautions Precautions: Fall Precaution Comments: hospitalization result of fall Required Braces or Orthoses: Knee Immobilizer - Left;Sling (Left) Knee Immobilizer - Left: On at all times Restrictions Weight Bearing Restrictions: Yes LUE Weight Bearing: Non weight bearing LLE Weight Bearing: Non weight bearing      Mobility Bed Mobility Overal bed mobility: Needs Assistance             General bed mobility comments: deferred due to L knee and L shoulder pain, R shoulder ROM limited and unable to assist in movement    Transfers                          Balance Overall balance assessment: History of Falls                                         ADL either performed or assessed with clinical judgement   ADL                                         General ADL Comments: Pt currently total A for feeding, bathing, grooming, toileting and dressing ADLs. She is currenlty bed level due to mobility limitations     Vision         Perception     Praxis      Pertinent Vitals/Pain Pain Assessment Pain Assessment: Faces Faces Pain Scale: Hurts whole lot Pain Location: with L sided movement Pain Descriptors / Indicators:  Discomfort, Grimacing, Guarding Pain Intervention(s): Monitored during session, Repositioned, Limited activity within patient's tolerance     Hand Dominance Right   Extremity/Trunk Assessment Upper Extremity Assessment Upper Extremity Assessment: Defer to OT evaluation RUE Deficits / Details: Residual deficits from an old injury, very limited shoulder RO about 5* at most LUE Deficits / Details: ROM of wrist and hand/fingers WFL, elbow flexion limited to 105* due to pain LUE: Unable to fully assess due to immobilization   Lower Extremity Assessment Lower Extremity Assessment: RLE deficits/detail;LLE  deficits/detail RLE Deficits / Details: Strength grossly 4/5, ROM limited by body habitus RLE Coordination: decreased fine motor LLE: Unable to fully assess due to pain;Unable to fully assess due to immobilization   Cervical / Trunk Assessment Cervical / Trunk Assessment: Other exceptions Cervical / Trunk Exceptions: increased body habitus   Communication Communication Communication: Prefers language other than English (utilized Forensic psychologist, multiple interpreters because kept dropping Nat [phone])   Cognition Arousal/Alertness: Awake/alert Behavior During Therapy: WFL for tasks assessed/performed Overall Cognitive Status: Within Functional Limits for tasks assessed                                       General Comments  VSS on RA, pt very pleasant and sweet    Exercises     Shoulder Instructions      Home Living Family/patient expects to be discharged to:: Private residence Living Arrangements: Children (son and daughter in-law) Available Help at Discharge: Family;Available PRN/intermittently Type of Home: House Home Access: Ramped entrance     Home Layout: Multi-level;Able to live on main level with bedroom/bathroom     Bathroom Shower/Tub: Chief Strategy Officer: Standard Bathroom Accessibility: No (wheelchair does not fit in bathroom, uses RW in bathroom)   Home Equipment: Tub bench;Wheelchair - Forensic psychologist (2 wheels);Rollator (4 wheels)          Prior Functioning/Environment Prior Level of Function : Needs assist;History of Falls (last six months)             Mobility Comments: pt was able to transfer to wheelchair, mobilize with wheelchair to bathroom where she used RW in bathroom and returned to wheelchair ADLs Comments: pt with refrigerator in her downstairs room, daughter in law prepares meals and assists with dressing, son assists with bathing able to perform toilet transfers independently        OT Problem  List: Impaired UE functional use;Decreased strength;Impaired balance (sitting and/or standing);Pain      OT Treatment/Interventions: Self-care/ADL training;Balance training;Patient/family education;Therapeutic activities    OT Goals(Current goals can be found in the care plan section) Acute Rehab OT Goals Patient Stated Goal: none stated OT Goal Formulation: With patient Time For Goal Achievement: 06/02/23 Potential to Achieve Goals: Good ADL Goals Pt Will Perform Eating: with max assist;bed level Pt Will Perform Grooming: with max assist;bed level Additional ADL Goal #2: Pt will verbalize understanding of how to doff/don sling  OT Frequency: Min 1X/week    Co-evaluation PT/OT/SLP Co-Evaluation/Treatment: Yes Reason for Co-Treatment: Complexity of the patient's impairments (multi-system involvement);For patient/therapist safety PT goals addressed during session: Mobility/safety with mobility OT goals addressed during session: ADL's and self-care      AM-PAC OT "6 Clicks" Daily Activity     Outcome Measure Help from another person eating meals?: Total Help from another person taking care of personal grooming?: Total Help from another person toileting, which includes  using toliet, bedpan, or urinal?: Total Help from another person bathing (including washing, rinsing, drying)?: Total Help from another person to put on and taking off regular upper body clothing?: Total Help from another person to put on and taking off regular lower body clothing?: Total 6 Click Score: 6   End of Session Nurse Communication: Mobility status  Activity Tolerance: Other (comment) (Limited due to mobility restrictions) Patient left: in bed;with call bell/phone within reach  OT Visit Diagnosis: Other abnormalities of gait and mobility (R26.89);Unsteadiness on feet (R26.81);History of falling (Z91.81);Pain                Time: 1610-9604 OT Time Calculation (min): 49 min Charges:  OT General  Charges $OT Visit: 1 Visit OT Evaluation $OT Eval Moderate Complexity: 1 Mod  05/19/2023  AB, OTR/L  Acute Rehabilitation Services  Office: [phone]   Tristan Schroeder 05/19/2023, 2:41 PM

## 2023-05-19 NOTE — Hospital Course (Signed)
77 y.o. female with medical history significant of hypertension, hyperlipidemia, chronic atrial fibrillation, s/p permanent pacemaker, hypothyroidism, CKD stage 3, lymphedema and history of multidrug-resistant bacteria who presents after a fall.  History is obtained with use of with use of the iPad interpreter services. At baseline patient is wheelchair-bound, but has been able to transfer without assistance.  She reports that yesterday she ate gone to the restroom and while trying to transfer from the toilet seat back to her wheelchair got dizzy and fell.  She thinks that she hit her head and may have lost consciousness briefly, but is not sure.  She landed on her left side and reported immediate pain in her left knee and shoulder.  She reported having some nausea after receiving pain meds, but denied having any abdominal pain or dysuria symptoms prior to coming to the hospital.   In the emergency department patient was noted to initially be afebrile with pulse 48-63, respiration 14-26 and all other signs maintained.  Labs from 7/10 noted hemoglobin 11.3, BUN 25, creatinine 1.48, alkaline phosphatase 148, and INR 1.4.  X-rays of the left knee noted a communicated fracture of the patella with x-rays of the shoulder noting a distal clavicle fracture.  Chest x-ray noted cardiomegaly with vascular congestion. Urinalysis noted moderate hemoglobin, large leukocytes, few bacteria, 21-50 RBC/hpf,  and greater than 50 WBCs.  CT scan of the left knee noted displaced fracture through the inferior pole of the left patella with moderate joint effusion.  Patient has been given fentanyl IV.  Orthopedics have been consulted.   Patient was placed in a left knee immobilizer as well as sling for the left arm.

## 2023-05-19 NOTE — Plan of Care (Signed)
°  Problem: Activity: °Goal: Risk for activity intolerance will decrease °Outcome: Progressing °  °Problem: Elimination: °Goal: Will not experience complications related to urinary retention °Outcome: Progressing °  °Problem: Pain Managment: °Goal: General experience of comfort will improve °Outcome: Progressing °  °

## 2023-05-19 NOTE — Plan of Care (Signed)
  Problem: Education: Goal: Knowledge of General Education information will improve Description: Including pain rating scale, medication(s)/side effects and non-pharmacologic comfort measures Outcome: Progressing   Problem: Health Behavior/Discharge Planning: Goal: Ability to manage health-related needs will improve Outcome: Progressing   Problem: Clinical Measurements: Goal: Ability to maintain clinical measurements within normal limits will improve Outcome: Progressing Goal: Will remain free from infection Outcome: Progressing Goal: Respiratory complications will improve Outcome: Progressing Goal: Cardiovascular complication will be avoided Outcome: Progressing   Problem: Activity: Goal: Risk for activity intolerance will decrease Outcome: Progressing   Problem: Coping: Goal: Level of anxiety will decrease Outcome: Progressing   Problem: Pain Managment: Goal: General experience of comfort will improve Outcome: Not Progressing   Problem: Safety: Goal: Ability to remain free from injury will improve Outcome: Progressing

## 2023-05-19 NOTE — Progress Notes (Signed)
Subjective:     Patient reports pain as mild to moderate.  Patient resting comfortably in bed.  Objective:   VITALS:  Temp:  [98.2 F (36.8 C)-98.8 F (37.1 C)] 98.5 F (36.9 C) (07/12 0403) Pulse Rate:  [61-64] 61 (07/12 0403) Resp:  [15-18] 15 (07/12 0403) BP: (135-159)/(54-78) 150/71 (07/12 0403) SpO2:  [96 %-100 %] 96 % (07/12 0403)  General: WDWN patient in NAD. Psych:  Appropriate mood and affect. Neuro:  A&O x 3, Moving all extremities, sensation intact to light touch HEENT:  EOMs intact Chest:  Even non-labored respirations Skin:  Sling to L UE and KI to L LE C/D/I, no rashes or lesions Extremities: warm/dry, mild edema to L shoulder and knee, erythema or echymosis.  No lymphadenopathy. Pulses: Popliteus and radial 2+ MSK:  ROM: TKE; full wrist ROM, MMT: able to perform quad set.   LABS Recent Labs    05/17/23 2107 05/17/23 2115  HGB 11.3* 12.6  WBC 5.3  --   PLT 156  --    Recent Labs    05/17/23 2107 05/17/23 2115  NA 139 139  K 3.5 3.6  CL 106 107  CO2 24  --   BUN 25* 27*  CREATININE 1.48* 1.40*  GLUCOSE 115* 110*   Recent Labs    05/17/23 2107  INR 1.4*     Assessment/Plan:     L clavicle fx -NWB; sling to L UE  L patella fx -to be treated in closed fashion. -NWB; KI to L LE -Up with therapy for transfers  Disp: pending  Rx for hydrocodone on chart after searching [place] PMP Aware. Plan for outpatient f/u visit in 2 weeks.  Alfredo Martinez PA-C [facility] Office:  [phone]

## 2023-05-19 NOTE — Evaluation (Signed)
Physical Therapy Evaluation Patient Details Name: Katelyn Jackson MRN: [id number] DOB: Jun 25, 1946 Today's Date: 05/19/2023  History of Present Illness  77 y.o. female who presents after a fall at home. CT and Xray show a L distal clavicle fx and a L patella fx. Past medical history significant of hypertension, hyperlipidemia, chronic atrial fibrillation, s/p permanent pacemaker, hypothyroidism, CKD stage 3, lymphedema and history of multidrug-resistant bacteria  Clinical Impression  PTA pt living with son and daughter in-law in multistory home with ramped entry. Pt lives on first floor and was able to perform pivot transfers to wheelchair, and utilize RW in bathroom to use toilet independently.  Family assists with bathing and dressing and all iADLs. Pt is currently limited in safe mobility by increased L knee and L shoulder pain as well as NWB precautions on L UE and L LE, in presence of already limited R UE limited ROM. Deferred mobility today due to increased pain on L side. Requested air mattress due to pt current immobility. Patient will benefit from continued inpatient follow up therapy, <3 hours/day. PT will continue to follow acutely.        Assistance Recommended at Discharge Frequent or constant Supervision/Assistance  If plan is discharge home, recommend the following:  Can travel by private vehicle  Two people to help with walking and/or transfers;Two people to help with bathing/dressing/bathroom;Assistance with cooking/housework;Direct supervision/assist for medications management;Assistance with feeding;Direct supervision/assist for financial management;Assist for transportation;Help with stairs or ramp for entrance   No    Equipment Recommendations Hospital bed (hoyer lift)     Functional Status Assessment Patient has had a recent decline in their functional status and demonstrates the ability to make significant improvements in function in a reasonable and predictable amount  of time.     Precautions / Restrictions Precautions Precautions: Fall Precaution Comments: hospitalization result of fall Required Braces or Orthoses: Knee Immobilizer - Left;Sling (Left) Knee Immobilizer - Left: On at all times Restrictions Weight Bearing Restrictions: Yes LUE Weight Bearing: Non weight bearing LLE Weight Bearing: Non weight bearing      Mobility  Bed Mobility Overal bed mobility: Needs Assistance             General bed mobility comments: deferred due to L knee and L shoulder pain, R shoulder ROM limited and unable to assist in movement       Balance Overall balance assessment: History of Falls                                           Pertinent Vitals/Pain Pain Assessment Pain Assessment: Faces Faces Pain Scale: Hurts whole lot Pain Location: with L sided movement Pain Descriptors / Indicators: Discomfort, Grimacing, Guarding Pain Intervention(s): Limited activity within patient's tolerance, Monitored during session, Repositioned    Home Living Family/patient expects to be discharged to:: Private residence Living Arrangements: Children (son and daughter in-law) Available Help at Discharge: Family;Available PRN/intermittently Type of Home: House Home Access: Ramped entrance       Home Layout: Multi-level;Able to live on main level with bedroom/bathroom Home Equipment: Tub bench;Wheelchair - Forensic psychologist (2 wheels);Rollator (4 wheels)      Prior Function Prior Level of Function : Needs assist;History of Falls (last six months)             Mobility Comments: pt was able to transfer to wheelchair, mobilize with wheelchair to bathroom where she  used RW in bathroom and returned to wheelchair ADLs Comments: pt with refrigerator in her downstairs room, daughter in law prepares meals and assists with dressing, son assists with bathing able to perform toilet transfers independently     Hand Dominance   Dominant  Hand: Right    Extremity/Trunk Assessment   Upper Extremity Assessment Upper Extremity Assessment: Defer to OT evaluation    Lower Extremity Assessment Lower Extremity Assessment: RLE deficits/detail;LLE deficits/detail RLE Deficits / Details: Strength grossly 4/5, ROM limited by body habitus RLE Coordination: decreased fine motor LLE: Unable to fully assess due to pain;Unable to fully assess due to immobilization       Communication   Communication: Prefers language other than English (utilized Forensic psychologist, multiple interpreters because kept dropping  Nat [phone])  Cognition Arousal/Alertness: Awake/alert Behavior During Therapy: WFL for tasks assessed/performed Overall Cognitive Status: Within Functional Limits for tasks assessed                                          General Comments General comments (skin integrity, edema, etc.): VSS on RA        Assessment/Plan    PT Assessment Patient needs continued PT services  PT Problem List Decreased strength;Decreased range of motion;Decreased activity tolerance;Decreased balance;Decreased mobility;Decreased coordination;Obesity;Decreased skin integrity;Pain       PT Treatment Interventions DME instruction;Functional mobility training;Therapeutic activities;Therapeutic exercise;Balance training;Cognitive remediation;Patient/family education;Wheelchair mobility training    PT Goals (Current goals can be found in the Care Plan section)  Acute Rehab PT Goals PT Goal Formulation: With patient Time For Goal Achievement: 06/02/23 Potential to Achieve Goals: Fair    Frequency Min 3X/week        AM-PAC PT "6 Clicks" Mobility  Outcome Measure Help needed turning from your back to your side while in a flat bed without using bedrails?: Total Help needed moving from lying on your back to sitting on the side of a flat bed without using bedrails?: Total Help needed moving to and from a bed to a chair  (including a wheelchair)?: Total Help needed standing up from a chair using your arms (e.g., wheelchair or bedside chair)?: Total Help needed to walk in hospital room?: Total Help needed climbing 3-5 steps with a railing? : Total 6 Click Score: 6    End of Session   Activity Tolerance: Patient limited by pain Patient left: in bed;with call bell/phone within reach;with bed alarm set Nurse Communication: Mobility status;Other (comment) (pt request to return to home medications, need for L LE to be maintained in knee extension, do not chuck knees or place bolster under knee,) PT Visit Diagnosis: Unsteadiness on feet (R26.81);Muscle weakness (generalized) (M62.81);History of falling (Z91.81);Pain Pain - Right/Left: Left Pain - part of body: Shoulder;Knee    Time: 0454-0981 PT Time Calculation (min) (ACUTE ONLY): 51 min   Charges:   PT Evaluation $PT Eval Moderate Complexity: 1 Mod   PT General Charges $$ ACUTE PT VISIT: 1 Visit         Marrisa Kimber B. Beverely Risen PT, DPT Acute Rehabilitation Services Please use secure chat or  Call Office [phone]   Elon Alas [facility] 05/19/2023, 12:09 PM

## 2023-05-20 DIAGNOSIS — E669 Obesity, unspecified: Secondary | ICD-10-CM | POA: Diagnosis present

## 2023-05-20 DIAGNOSIS — S42002A Fracture of unspecified part of left clavicle, initial encounter for closed fracture: Secondary | ICD-10-CM | POA: Diagnosis not present

## 2023-05-20 DIAGNOSIS — Y92012 Bathroom of single-family (private) house as the place of occurrence of the external cause: Secondary | ICD-10-CM | POA: Diagnosis not present

## 2023-05-20 DIAGNOSIS — Z96611 Presence of right artificial shoulder joint: Secondary | ICD-10-CM | POA: Diagnosis present

## 2023-05-20 DIAGNOSIS — S82002A Unspecified fracture of left patella, initial encounter for closed fracture: Secondary | ICD-10-CM | POA: Diagnosis not present

## 2023-05-20 DIAGNOSIS — I447 Left bundle-branch block, unspecified: Secondary | ICD-10-CM | POA: Diagnosis present

## 2023-05-20 DIAGNOSIS — E785 Hyperlipidemia, unspecified: Secondary | ICD-10-CM | POA: Diagnosis present

## 2023-05-20 DIAGNOSIS — K219 Gastro-esophageal reflux disease without esophagitis: Secondary | ICD-10-CM | POA: Diagnosis present

## 2023-05-20 DIAGNOSIS — Z603 Acculturation difficulty: Secondary | ICD-10-CM | POA: Diagnosis present

## 2023-05-20 DIAGNOSIS — R001 Bradycardia, unspecified: Secondary | ICD-10-CM | POA: Diagnosis present

## 2023-05-20 DIAGNOSIS — N390 Urinary tract infection, site not specified: Secondary | ICD-10-CM | POA: Diagnosis present

## 2023-05-20 DIAGNOSIS — I4821 Permanent atrial fibrillation: Secondary | ICD-10-CM | POA: Diagnosis present

## 2023-05-20 DIAGNOSIS — K7469 Other cirrhosis of liver: Secondary | ICD-10-CM | POA: Diagnosis present

## 2023-05-20 DIAGNOSIS — L89152 Pressure ulcer of sacral region, stage 2: Secondary | ICD-10-CM | POA: Diagnosis present

## 2023-05-20 DIAGNOSIS — N1832 Chronic kidney disease, stage 3b: Secondary | ICD-10-CM | POA: Diagnosis present

## 2023-05-20 DIAGNOSIS — I482 Chronic atrial fibrillation, unspecified: Secondary | ICD-10-CM | POA: Diagnosis not present

## 2023-05-20 DIAGNOSIS — R42 Dizziness and giddiness: Secondary | ICD-10-CM | POA: Diagnosis present

## 2023-05-20 DIAGNOSIS — R410 Disorientation, unspecified: Secondary | ICD-10-CM | POA: Diagnosis present

## 2023-05-20 DIAGNOSIS — W1811XA Fall from or off toilet without subsequent striking against object, initial encounter: Secondary | ICD-10-CM | POA: Diagnosis present

## 2023-05-20 DIAGNOSIS — S82092A Other fracture of left patella, initial encounter for closed fracture: Secondary | ICD-10-CM | POA: Diagnosis present

## 2023-05-20 DIAGNOSIS — Z79890 Hormone replacement therapy (postmenopausal): Secondary | ICD-10-CM | POA: Diagnosis not present

## 2023-05-20 DIAGNOSIS — Z923 Personal history of irradiation: Secondary | ICD-10-CM | POA: Diagnosis not present

## 2023-05-20 DIAGNOSIS — Z6838 Body mass index (BMI) 38.0-38.9, adult: Secondary | ICD-10-CM | POA: Diagnosis not present

## 2023-05-20 DIAGNOSIS — Z79899 Other long term (current) drug therapy: Secondary | ICD-10-CM | POA: Diagnosis not present

## 2023-05-20 DIAGNOSIS — M109 Gout, unspecified: Secondary | ICD-10-CM | POA: Diagnosis present

## 2023-05-20 DIAGNOSIS — I890 Lymphedema, not elsewhere classified: Secondary | ICD-10-CM | POA: Diagnosis present

## 2023-05-20 DIAGNOSIS — I129 Hypertensive chronic kidney disease with stage 1 through stage 4 chronic kidney disease, or unspecified chronic kidney disease: Secondary | ICD-10-CM | POA: Diagnosis present

## 2023-05-20 DIAGNOSIS — S82012A Displaced osteochondral fracture of left patella, initial encounter for closed fracture: Secondary | ICD-10-CM | POA: Diagnosis present

## 2023-05-20 DIAGNOSIS — E039 Hypothyroidism, unspecified: Secondary | ICD-10-CM | POA: Diagnosis present

## 2023-05-20 DIAGNOSIS — Z993 Dependence on wheelchair: Secondary | ICD-10-CM | POA: Diagnosis not present

## 2023-05-20 DIAGNOSIS — W19XXXA Unspecified fall, initial encounter: Secondary | ICD-10-CM | POA: Diagnosis not present

## 2023-05-20 DIAGNOSIS — S42032A Displaced fracture of lateral end of left clavicle, initial encounter for closed fracture: Secondary | ICD-10-CM | POA: Diagnosis present

## 2023-05-20 LAB — COMPREHENSIVE METABOLIC PANEL
ALT: 14 U/L (ref 0–44)
AST: 17 U/L (ref 15–41)
Albumin: 2.6 g/dL — ABNORMAL LOW (ref 3.5–5.0)
Alkaline Phosphatase: 121 U/L (ref 38–126)
Anion gap: 7 (ref 5–15)
BUN: 19 mg/dL (ref 8–23)
CO2: 24 mmol/L (ref 22–32)
Calcium: 9.4 mg/dL (ref 8.9–10.3)
Chloride: 109 mmol/L (ref 98–111)
Creatinine, Ser: 1.3 mg/dL — ABNORMAL HIGH (ref 0.44–1.00)
GFR, Estimated: 42 mL/min — ABNORMAL LOW (ref >60)
Glucose, Bld: 121 mg/dL — ABNORMAL HIGH (ref 70–99)
Potassium: 3.4 mmol/L — ABNORMAL LOW (ref 3.5–5.1)
Sodium: 140 mmol/L (ref 135–145)
Total Bilirubin: 1 mg/dL (ref 0.3–1.2)
Total Protein: 5.8 g/dL — ABNORMAL LOW (ref 6.5–8.1)

## 2023-05-20 MED ORDER — LACTULOSE 10 GM/15ML PO SOLN
10.0000 g | Freq: Two times a day (BID) | ORAL | Status: DC
  Administered 2023-05-21 – 2023-05-23 (×5): 10 g via ORAL
  Filled 2023-05-20 (×5): qty 15

## 2023-05-20 MED ORDER — POTASSIUM CHLORIDE CRYS ER 20 MEQ PO TBCR
60.0000 meq | EXTENDED_RELEASE_TABLET | Freq: Once | ORAL | Status: AC
  Administered 2023-05-20: 60 meq via ORAL
  Filled 2023-05-20: qty 3

## 2023-05-20 MED ORDER — SODIUM CHLORIDE 0.9 % IV SOLN
1.0000 g | INTRAVENOUS | Status: AC
  Administered 2023-05-20 – 2023-05-22 (×3): 1 g via INTRAVENOUS
  Filled 2023-05-20 (×3): qty 10

## 2023-05-20 NOTE — Progress Notes (Signed)
Progress Note   Patient: Katelyn Jackson ZOX:[id number] DOB: Jun 27, 1946 DOA: 05/17/2023     0 DOS: the patient was seen and examined on 05/20/2023   Brief hospital course: 77 y.o. female with medical history significant of hypertension, hyperlipidemia, chronic atrial fibrillation, s/p permanent pacemaker, hypothyroidism, CKD stage 3, lymphedema and history of multidrug-resistant bacteria who presents after a fall.  History is obtained with use of with use of the iPad interpreter services. At baseline patient is wheelchair-bound, but has been able to transfer without assistance.  She reports that yesterday she ate gone to the restroom and while trying to transfer from the toilet seat back to her wheelchair got dizzy and fell.  She thinks that she hit her head and may have lost consciousness briefly, but is not sure.  She landed on her left side and reported immediate pain in her left knee and shoulder.  She reported having some nausea after receiving pain meds, but denied having any abdominal pain or dysuria symptoms prior to coming to the hospital.   In the emergency department patient was noted to initially be afebrile with pulse 48-63, respiration 14-26 and all other signs maintained.  Labs from 7/10 noted hemoglobin 11.3, BUN 25, creatinine 1.48, alkaline phosphatase 148, and INR 1.4.  X-rays of the left knee noted a communicated fracture of the patella with x-rays of the shoulder noting a distal clavicle fracture.  Chest x-ray noted cardiomegaly with vascular congestion. Urinalysis noted moderate hemoglobin, large leukocytes, few bacteria, 21-50 RBC/hpf,  and greater than 50 WBCs.  CT scan of the left knee noted displaced fracture through the inferior pole of the left patella with moderate joint effusion.  Patient has been given fentanyl IV.  Orthopedics have been consulted.   Patient was placed in a left knee immobilizer as well as sling for the left arm.  Assessment and Plan: Communicated fracture  of the patella and distal clavicle fracture secondary to fall Patient presents after having a fall while trying to transfer from commode to her wheelchair.  Found to have communicated fracture of the patella and left distal clavicle fracture.   -Orthopedics consulted, but had recommended nonoperative management and pain control. -Transitions of care consulted for placement   UTI Urinalysis noted moderate hemoglobin, large leukocytes, few bacteria, 21-50 RBC/hpf,  and greater than 50 WBCs.  Last available urine culture from 08/2021 noted 50,000 colonies of vancomycin resistant Enterococcus. -Pt initially reported no symptoms, however on 7/13, pt did complain of dysuria with suprapubic discomfort -Urine cx without growth. Will treat empirically with rocephin x 3 days   Chronic atrial fibrillation on chronic anticoagulation S/p permanent pacemaker Patient noted to be in a paced rhythm. -Continue Eliquis    Essential hypertension Blood pressures range from 114/56-172/83 -Continue Coreg as tolerated with holding parameters.   CKD stage IIIb Chronic.  Creatinine 1.30 with BUN 19 -Continue to monitor   Hypothyroidism -Continue levothyroxine   History of breast cancer -Continue tamoxifen   History of gout -Continue allopurinol  Cirrhosis, diagnosed 2022 -Suspected NASH cirrhosis -continued on lactulose and xifaxan -LFT's reviewed, unremarkable      Subjective: Complaining of pain at the end of urination as well as tenderness in the suprapubic area  Physical Exam: Vitals:   05/19/23 0403 05/19/23 0802 05/19/23 2007 05/20/23 1536  BP: (!) 150/71 (!) 146/58 (!) 128/56 (!) 149/59  Pulse: 61 (!) 57 60 (!) 58  Resp: 15 18 17 18   Temp: 98.5 F (36.9 C) 98.6 F (37 C) 99.1  F (37.3 C) 98.6 F (37 C)  TempSrc: Oral  Oral   SpO2: 96% 95% 95% 98%  Weight:      Height:       General exam: Conversant, in no acute distress Respiratory system: normal chest rise, clear, no audible  wheezing Cardiovascular system: regular rhythm, s1-s2 Gastrointestinal system: Nondistended, nontender, pos BS Central nervous system: No seizures, no tremors Extremities: No cyanosis, no joint deformities Skin: No rashes, no pallor Psychiatry: Affect normal // no auditory hallucinations   Data Reviewed:  Labs reviewed: Na 140, K 3.4, Cr 1.30  Family Communication: pt in room, family at bedside  Disposition: Status is: Observation The patient will require care spanning > 2 midnights and should be moved to inpatient because: Severity of illness  Planned Discharge Destination: Skilled nursing facility    Author: Rickey Barbara, MD 05/20/2023 4:00 PM  For on call review [email or web address].

## 2023-05-20 NOTE — [place] FL2 (Signed)
[place] MEDICAID FL2 LEVEL OF CARE FORM     IDENTIFICATION  Patient Name: Katelyn Jackson Birthdate: 1946-05-27 Sex: female Admission Date (Current Location): 05/17/2023  [place] and [place] Number:  [name] [id number] Facility and Address:  [facility], [address] [place], [place] [address]      Provider Number: [id number]  Attending Physician Name and Address:  Jerald Kief, MD  Relative Name and Phone Number:  Nolon Nations (Son)  [phone]    Current Level of Care: Hospital Recommended Level of Care: Skilled Nursing Facility Prior Approval Number:    Date Approved/Denied:   PASRR Number: [id number]  Discharge Plan: SNF    Current Diagnoses: Patient Active Problem List   Diagnosis Date Noted   Fall 05/18/2023   Patella fracture 05/18/2023   Clavicle fracture 05/18/2023   Abnormal urinalysis 05/18/2023   CKD (chronic kidney disease), stage III (HCC) 05/18/2023   History of breast cancer 05/18/2023   Intertrigo 04/17/2023   Skin lesion 03/08/2023   Chronic heart failure with preserved ejection fraction (HCC) 12/16/2022   Callus 11/04/2022   Cardiac pacemaker in situ 12/10/2021   Morbid obesity with BMI of 45.0-49.9, adult (HCC) 12/10/2021   Breast lesion 12/07/2021   Dry skin 09/22/2021   Colonization with VRE (vancomycin-resistant enterococcus) 09/22/2021   Osteoarthritis of right knee 08/19/2021   Herpes labialis 08/19/2021   Liver cirrhosis (HCC) 07/22/2021   Abscess    Prosthetic joint infection (HCC)    Abscess of right shoulder 07/21/2021   History of pacemaker 07/21/2021   Pressure injury of skin 07/03/2021   Hypothyroidism    Cellulitis    Postoperative anemia due to acute blood loss 03/19/2021   Dyspnea 03/19/2021   Symptomatic anemia 03/19/2021   Status post right shoulder hemiarthroplasty 03/17/2021   Bradycardia 01/05/2021   CAP (community acquired pneumonia) 01/05/2021   CKD (chronic kidney  disease) stage 4, GFR 15-29 ml/min (HCC) 01/05/2021   Bilateral pain of leg and foot 10/08/2020   Abscess of left thumb 06/17/2020   Cellulitis and abscess of left leg 06/17/2020   Herpes zoster 02/18/2020   GERD (gastroesophageal reflux disease) 11/25/2019   Wheezing 10/26/2017   Sciatica of left side 08/29/2017   Morbid obesity (HCC) 07/11/2017   Hyperglycemia 07/11/2017   Low back pain 06/19/2017   Acute renal failure superimposed on stage 3b chronic kidney disease (HCC) 06/19/2017   Hypokalemia 06/19/2017   Acute lower UTI 06/19/2017   Anemia of chronic disease 02/16/2017   Malignant neoplasm of upper-outer quadrant of left breast in female, estrogen receptor positive (HCC) 02/10/2017   Essential hypertension 04/05/2016   Dyslipidemia 04/05/2016   Chronic atrial fibrillation (HCC) 04/05/2016   Pulmonary hypertension (HCC) 04/05/2016   Lymphedema 04/05/2016    Orientation RESPIRATION BLADDER Height & Weight     Self, Time, Situation, Place  Normal Incontinent Weight: 202 lb 13.2 oz (92 kg) Height:  5\' 1"  (154.9 cm)  BEHAVIORAL SYMPTOMS/MOOD NEUROLOGICAL BOWEL NUTRITION STATUS      Incontinent Diet (see d/c summary)  AMBULATORY STATUS COMMUNICATION OF NEEDS Skin   Total Care Verbally PU Stage and Appropriate Care (1. stage 3 upper thigh; 2.  Stage 2 sacrum 3.  Lower left thigh wound)                       Personal Care Assistance Level of Assistance  Bathing, Feeding, Dressing Bathing Assistance: Maximum assistance Feeding assistance: Limited assistance Dressing Assistance: Maximum assistance  Functional Limitations Info  Sight, Speech, Hearing Sight Info: Adequate Hearing Info: Adequate Speech Info: Adequate    SPECIAL CARE FACTORS FREQUENCY  PT (By licensed PT), OT (By licensed OT)     PT Frequency: 5x/ week OT Frequency: 5x/ week            Contractures Contractures Info: Not present    Additional Factors Info                  Current  Medications (05/20/2023):  This is the current hospital active medication list Current Facility-Administered Medications  Medication Dose Route Frequency Provider Last Rate Last Admin   acetaminophen (TYLENOL) tablet 650 mg  650 mg Oral Q6H PRN Clydie Braun, MD       Or   acetaminophen (TYLENOL) suppository 650 mg  650 mg Rectal Q6H PRN Madelyn Flavors A, MD       albuterol (PROVENTIL) (2.5 MG/3ML) 0.083% nebulizer solution 2.5 mg  2.5 mg Nebulization Q6H PRN Madelyn Flavors A, MD       apixaban (ELIQUIS) tablet 5 mg  5 mg Oral BID Katrinka Blazing, Rondell A, MD   5 mg at 05/20/23 0855   carvedilol (COREG) tablet 12.5 mg  12.5 mg Oral BID WC Jerald Kief, MD   12.5 mg at 05/20/23 0855   docusate sodium (COLACE) capsule 100 mg  100 mg Oral BID Jerald Kief, MD   100 mg at 05/20/23 0855   fentaNYL (SUBLIMAZE) injection 25 mcg  25 mcg Intravenous Q2H PRN Madelyn Flavors A, MD   25 mcg at 05/20/23 0948   hydrALAZINE (APRESOLINE) injection 10 mg  10 mg Intravenous Q4H PRN Madelyn Flavors A, MD       lactulose (CHRONULAC) 10 GM/15ML solution 15 g  15 g Oral TID Jerald Kief, MD   15 g at 05/20/23 0854   levothyroxine (SYNTHROID) tablet 50 mcg  50 mcg Oral QAC breakfast Madelyn Flavors A, MD   50 mcg at 05/20/23 0505   lipase/protease/amylase (CREON) capsule 12,000 Units  12,000 Units Oral Daily PRN Jerald Kief, MD   12,000 Units at 05/19/23 2226   oxyCODONE-acetaminophen (PERCOCET/ROXICET) 5-325 MG per tablet 1 tablet  1 tablet Oral Q6H PRN Madelyn Flavors A, MD   1 tablet at 05/19/23 2226   pravastatin (PRAVACHOL) tablet 20 mg  20 mg Oral q1800 Jerald Kief, MD   20 mg at 05/19/23 1738   rifaximin (XIFAXAN) tablet 550 mg  550 mg Oral BID Jerald Kief, MD   550 mg at 05/20/23 0854   sodium chloride flush (NS) 0.9 % injection 3 mL  3 mL Intravenous Q12H Smith, Rondell A, MD   3 mL at 05/20/23 0855   torsemide (DEMADEX) tablet 20 mg  20 mg Oral QPM Jerald Kief, MD   20 mg at 05/19/23 1732    torsemide (DEMADEX) tablet 40 mg  40 mg Oral q AM Jerald Kief, MD   40 mg at 05/20/23 9528     Discharge Medications: Please see discharge summary for a list of discharge medications.  Relevant Imaging Results:  Relevant Lab Results:   Additional Information SSN# [id number]   Ht:  5\' 1"    Wt:  202 lbs  Jasmynn Pfalzgraf F Mayu Ronk, LCSW

## 2023-05-20 NOTE — TOC Initial Note (Signed)
Transition of Care [facility]) - Initial/Assessment Note    Patient Details  Name: Katelyn Jackson MRN: [id number] Date of Birth: 1945-12-06  Transition of Care [facility]) CM/SW Contact:    Ralene Bathe, LCSW Phone Number: 05/20/2023, 10:35 AM  Clinical Narrative:                 CSW received consult for possible SNF placement at time of discharge. CSW spoke with patient's son, Jeb Levering.  The family expressed understanding of PT recommendation and is agreeable to SNF placement at time of discharge. The family report a preference for a facility in [address] or [place]. CSW discussed insurance authorization process and provided family with the website to view the Medicare SNF ratings list. CSW will send out referrals for review and provide bed offers as available.   Skilled Nursing Rehab Facilities-   [email or web address]   Ratings out of 5 stars (5 the highest)   Name Address  Phone # Quality Care Staffing Health Inspection Overall  [facility] [address] [phone] 2 1 5 4   [facility] [address], [place] [phone] 4 1 3 2   [facility] [address], [place]inette Otto [phone] 2 1 1 1   [facility] [address] [place] [phone] 4 1 3 2   [facility]  [address], [place] [phone] 3 2 5 5   [facility] [address] [facility] [phone] 2 1 2 1   [facility] [address] [place] [phone] 4 1 2 1   [facility] [address] [place] [phone] 2 4 3 3   [address] ([place]) [address] [place] [phone] 3 2 2 2   [facility] [address] [place], [place] [phone] 1 2 1 1   [facility] ([place]) [address], [place] [phone] 3 1 1 1   [name] [address] Liliane Shi [phone] 4 3 4 4   [facility] [address], [place] [phone] 3 4 3 3            [facility] [address], [place] [phone]      [facility] EX[facility]IR[facility], [place] [address], [place] [phone] 1 1 2 1   [facility] [address] [place] [phone] 2 2 4 4   [facility] [address], [place] D[place] [phone] 2 1 4 3   [facility] [address], [place] [phone] 3 3 3 3           [address] (no [facility]) [address], [place] [phone] 4 4 5 5   [facility] (No [employer]) [address] [phone] 2 2 4 4   [facility] [address] [place] [phone] 2 1 2 1   [facility] (No [facility]) [address], [place] [phone] 5 5 5 5   [facility] [address] [facility] [phone] 2 3 5 5   [facility] [address] [place] [phone] 2 1 1 1   [facility] [address] Liliane Shi [phone] 5 2 5 5   [facility]  [address] [place] [phone] 2 2 2 2   [facility] [address] [place] [phone] 4 2 1 1   [facility] [address] [place], [place] [phone] 2 2 3 3           [facility] [address] [place] [phone] 1 1 1 1   [facility] [address], Evlyn Clines  [phone] 2 3 3 3   [facility] (No Humana) [address], [place] [phone] 2 1 3 2   [facility]) [address], [place] Levan [phone] 1 1 1 1   [facility] [address], [place] [phone] 3 2 5 5   [facility] [address], [place] [phone] 2 1 1 1           [facility] [address] [place], [place] [phone] 4 4 5 5   [facility])  [address] [place] [phone] 2 1 2 1   [facility]) [address] [place] [phone]  1 4 3   [facility] [address] [place] [phone] 3 5 4 5   [address] [place], [place] [phone] 3 2 2 2   [facility]) [address] [place] [phone] 2 1 3 2     Expected Discharge Plan: Skilled Nursing Facility Barriers to Discharge: Insurance Authorization, SNF Pending bed offer   Patient Goals and CMS Choice   CMS Medicare.gov Compare Post Acute Care list provided to:: Patient Represenative (must comment) Choice offered to / list presented to : Adult Children      Expected Discharge Plan and Services       Living arrangements for the past 2 months: Single Family Home                                      Prior Living Arrangements/Services Living arrangements for the past 2 months: Single Family Home Lives with:: Relatives Patient language and need for interpreter reviewed:: Yes Do you feel safe going back to the place where you live?: Yes      Need for Family Participation in Patient Care: Yes (Comment) Care giver support system in place?: Yes (comment)   Criminal Activity/Legal Involvement Pertinent to Current Situation/Hospitalization: No - Comment as needed  Activities of Daily Living Home Assistive Devices/Equipment: Wheelchair, Environmental consultant (specify type) ADL Screening (condition at time of admission) Patient's cognitive ability adequate to safely complete daily activities?: Yes Is the patient deaf or have difficulty hearing?: No Does the patient have difficulty seeing, even when wearing glasses/contacts?: No Does the patient have difficulty concentrating, remembering, or making decisions?: No Patient able to express need for assistance with ADLs?: Yes Does the patient have difficulty dressing or bathing?: Yes Independently performs ADLs?: No Communication: Independent Dressing (OT): Needs assistance Is this a change from baseline?: Pre-admission baseline Grooming: Independent Feeding: Independent Bathing: Needs assistance Is this a change from baseline?: Pre-admission baseline Toileting: Needs assistance Is this a change from baseline?: Change from baseline, expected to last  >3days In/Out Bed: Needs assistance Is this a change from baseline?: Change from baseline, expected to last >3 days Walks in Home: Needs assistance Is this a change from baseline?: Change from baseline, expected to last >3 days Does the patient have difficulty walking or climbing stairs?: Yes Weakness of Legs: Both Weakness of Arms/Hands: Both  Permission Sought/Granted   Permission granted to share information with : Yes, Verbal Permission Granted     Permission granted to share info w AGENCY: SNFs        Emotional Assessment       Orientation: : Oriented to Self, Oriented to Place, Oriented to  Time, Oriented to Situation Alcohol / Substance Use: Not Applicable Psych Involvement: No (comment)  Admission diagnosis:  Fall [W19.XXXA] Injury of head, initial encounter [S09.90XA] Fall, initial encounter [W19.XXXA] Closed displaced fracture  of left clavicle, unspecified part of clavicle, initial encounter [S42.002A] Closed displaced fracture of left patella, unspecified fracture morphology, initial encounter [S82.002A] Patient Active Problem List   Diagnosis Date Noted   Fall 05/18/2023   Patella fracture 05/18/2023   Clavicle fracture 05/18/2023   Abnormal urinalysis 05/18/2023   CKD (chronic kidney disease), stage III (HCC) 05/18/2023   History of breast cancer 05/18/2023   Intertrigo 04/17/2023   Skin lesion 03/08/2023   Chronic heart failure with preserved ejection fraction (HCC) 12/16/2022   Callus 11/04/2022   Cardiac pacemaker in situ 12/10/2021   Morbid obesity with BMI of 45.0-49.9, adult (HCC) 12/10/2021   Breast lesion 12/07/2021   Dry skin 09/22/2021   Colonization with VRE (vancomycin-resistant enterococcus) 09/22/2021   Osteoarthritis of right knee 08/19/2021   Herpes labialis 08/19/2021   Liver cirrhosis (HCC) 07/22/2021   Abscess    Prosthetic joint infection (HCC)    Abscess of right shoulder 07/21/2021   History of pacemaker 07/21/2021   Pressure  injury of skin 07/03/2021   Hypothyroidism    Cellulitis    Postoperative anemia due to acute blood loss 03/19/2021   Dyspnea 03/19/2021   Symptomatic anemia 03/19/2021   Status post right shoulder hemiarthroplasty 03/17/2021   Bradycardia 01/05/2021   CAP (community acquired pneumonia) 01/05/2021   CKD (chronic kidney disease) stage 4, GFR 15-29 ml/min (HCC) 01/05/2021   Bilateral pain of leg and foot 10/08/2020   Abscess of left thumb 06/17/2020   Cellulitis and abscess of left leg 06/17/2020   Herpes zoster 02/18/2020   GERD (gastroesophageal reflux disease) 11/25/2019   Wheezing 10/26/2017   Sciatica of left side 08/29/2017   Morbid obesity (HCC) 07/11/2017   Hyperglycemia 07/11/2017   Low back pain 06/19/2017   Acute renal failure superimposed on stage 3b chronic kidney disease (HCC) 06/19/2017   Hypokalemia 06/19/2017   Acute lower UTI 06/19/2017   Anemia of chronic disease 02/16/2017   Malignant neoplasm of upper-outer quadrant of left breast in female, estrogen receptor positive (HCC) 02/10/2017   Essential hypertension 04/05/2016   Dyslipidemia 04/05/2016   Chronic atrial fibrillation (HCC) 04/05/2016   Pulmonary hypertension (HCC) 04/05/2016   Lymphedema 04/05/2016   PCP:  Tresa Garter, MD Pharmacy:   [facility] [place] [place] - [address] [place] [address] Phone: [phone] Fax: [phone]     Social Determinants of Health (SDOH) Social History: SDOH Screenings   Food Insecurity: No Food Insecurity (05/18/2023)  Housing: Patient Unable To Answer (05/18/2023)  Transportation Needs: No Transportation Needs (05/18/2023)  Utilities: Not At Risk (05/18/2023)  Alcohol Screen: Low Risk  (06/15/2022)  Depression (PHQ2-9): Low Risk  (04/17/2023)  Financial Resource Strain: Low Risk  (06/15/2022)  Physical Activity: Inactive (06/15/2022)  Social Connections: Socially Isolated (06/15/2022)  Stress: No Stress Concern  Present (06/15/2022)  Tobacco Use: Low Risk  (05/18/2023)   SDOH Interventions:     Readmission Risk Interventions    07/06/2021   11:49 AM 03/19/2021    3:03 PM  Readmission Risk Prevention Plan  Transportation Screening Complete Complete  HRI or Home Care Consult  Complete  Social Work Consult for Recovery Care Planning/Counseling  Complete  Palliative Care Screening  Not Applicable  Medication Review Oceanographer)  Complete  HRI or Home Care Consult Complete   SW Recovery Care/Counseling Consult Complete   Palliative Care Screening Not Applicable   Skilled Nursing Facility Complete

## 2023-05-20 NOTE — Plan of Care (Signed)
  Problem: Clinical Measurements: Goal: Cardiovascular complication will be avoided Outcome: Progressing   Problem: Nutrition: Goal: Adequate nutrition will be maintained Outcome: Progressing   

## 2023-05-21 DIAGNOSIS — S82002A Unspecified fracture of left patella, initial encounter for closed fracture: Secondary | ICD-10-CM | POA: Diagnosis not present

## 2023-05-21 DIAGNOSIS — W19XXXA Unspecified fall, initial encounter: Secondary | ICD-10-CM | POA: Diagnosis not present

## 2023-05-21 DIAGNOSIS — S42002A Fracture of unspecified part of left clavicle, initial encounter for closed fracture: Secondary | ICD-10-CM | POA: Diagnosis not present

## 2023-05-21 LAB — CBC
HCT: 35.3 % — ABNORMAL LOW (ref 36.0–46.0)
Hemoglobin: 10.8 g/dL — ABNORMAL LOW (ref 12.0–15.0)
MCH: 29.4 pg (ref 26.0–34.0)
MCHC: 30.6 g/dL (ref 30.0–36.0)
MCV: 96.2 fL (ref 80.0–100.0)
Platelets: 139 10*3/uL — ABNORMAL LOW (ref 150–400)
RBC: 3.67 MIL/uL — ABNORMAL LOW (ref 3.87–5.11)
RDW: 15.5 % (ref 11.5–15.5)
WBC: 5.7 10*3/uL (ref 4.0–10.5)
nRBC: 0 % (ref 0.0–0.2)

## 2023-05-21 LAB — COMPREHENSIVE METABOLIC PANEL
ALT: 16 U/L (ref 0–44)
AST: 15 U/L (ref 15–41)
Albumin: 2.8 g/dL — ABNORMAL LOW (ref 3.5–5.0)
Alkaline Phosphatase: 121 U/L (ref 38–126)
Anion gap: 9 (ref 5–15)
BUN: 18 mg/dL (ref 8–23)
CO2: 26 mmol/L (ref 22–32)
Calcium: 9.2 mg/dL (ref 8.9–10.3)
Chloride: 103 mmol/L (ref 98–111)
Creatinine, Ser: 1.49 mg/dL — ABNORMAL HIGH (ref 0.44–1.00)
GFR, Estimated: 36 mL/min — ABNORMAL LOW (ref >60)
Glucose, Bld: 105 mg/dL — ABNORMAL HIGH (ref 70–99)
Potassium: 3.5 mmol/L (ref 3.5–5.1)
Sodium: 138 mmol/L (ref 135–145)
Total Bilirubin: 1 mg/dL (ref 0.3–1.2)
Total Protein: 5.8 g/dL — ABNORMAL LOW (ref 6.5–8.1)

## 2023-05-21 MED ORDER — METHOCARBAMOL 500 MG PO TABS
500.0000 mg | ORAL_TABLET | Freq: Three times a day (TID) | ORAL | Status: DC | PRN
  Administered 2023-05-22 – 2023-05-23 (×3): 500 mg via ORAL
  Filled 2023-05-21 (×3): qty 1

## 2023-05-21 NOTE — Plan of Care (Signed)

## 2023-05-21 NOTE — Progress Notes (Signed)
Progress Note   Patient: Katelyn Jackson ZOX:[id number] DOB: 1946-07-04 DOA: 05/17/2023     1 DOS: the patient was seen and examined on 05/21/2023   Brief hospital course: 77 y.o. female with medical history significant of hypertension, hyperlipidemia, chronic atrial fibrillation, s/p permanent pacemaker, hypothyroidism, CKD stage 3, lymphedema and history of multidrug-resistant bacteria who presents after a fall.  History is obtained with use of with use of the iPad interpreter services. At baseline patient is wheelchair-bound, but has been able to transfer without assistance.  She reports that yesterday she ate gone to the restroom and while trying to transfer from the toilet seat back to her wheelchair got dizzy and fell.  She thinks that she hit her head and may have lost consciousness briefly, but is not sure.  She landed on her left side and reported immediate pain in her left knee and shoulder.  She reported having some nausea after receiving pain meds, but denied having any abdominal pain or dysuria symptoms prior to coming to the hospital.   In the emergency department patient was noted to initially be afebrile with pulse 48-63, respiration 14-26 and all other signs maintained.  Labs from 7/10 noted hemoglobin 11.3, BUN 25, creatinine 1.48, alkaline phosphatase 148, and INR 1.4.  X-rays of the left knee noted a communicated fracture of the patella with x-rays of the shoulder noting a distal clavicle fracture.  Chest x-ray noted cardiomegaly with vascular congestion. Urinalysis noted moderate hemoglobin, large leukocytes, few bacteria, 21-50 RBC/hpf,  and greater than 50 WBCs.  CT scan of the left knee noted displaced fracture through the inferior pole of the left patella with moderate joint effusion.  Patient has been given fentanyl IV.  Orthopedics have been consulted.   Patient was placed in a left knee immobilizer as well as sling for the left arm.  Assessment and Plan: Communicated fracture  of the patella and distal clavicle fracture secondary to fall Patient presents after having a fall while trying to transfer from commode to her wheelchair.  Found to have communicated fracture of the patella and left distal clavicle fracture.   -Orthopedics consulted, but had recommended nonoperative management and pain control. -Transitions of care consulted for placement -continue analgesia as needed   UTI Urinalysis noted moderate hemoglobin, large leukocytes, few bacteria, 21-50 RBC/hpf,  and greater than 50 WBCs.  Last available urine culture from 08/2021 noted 50,000 colonies of vancomycin resistant Enterococcus. -Pt initially reported no symptoms, however on 7/13, pt did complain of dysuria with suprapubic discomfort -Urine cx without significant growth. Will treat empirically with rocephin x 3 days   Chronic atrial fibrillation on chronic anticoagulation S/p permanent pacemaker Patient noted to be in a paced rhythm. -Continue Eliquis    Essential hypertension Blood pressures range from 114/56-172/83 -Continue Coreg as tolerated with holding parameters.   CKD stage IIIb -Continue to monitor -renal function seems stable -Recheck bmet in AM   Hypothyroidism -Continue levothyroxine   History of breast cancer -Continue tamoxifen   History of gout -Continue allopurinol  Cirrhosis, diagnosed 2022 -Suspected NASH cirrhosis -continued on lactulose and xifaxan -LFT's remain stable, cont to monitor      Subjective: Without complaints this AM although staff reports pt did complain of increased pain this AM  Physical Exam: Vitals:   05/20/23 2042 05/21/23 0456 05/21/23 0723 05/21/23 1451  BP: (!) 127/47 (!) 140/68 134/62 (!) 131/57  Pulse: (!) 59 (!) 52 63 63  Resp: 17 17 18 18   Temp: 98.6 F (  37 C) 98.5 F (36.9 C) 98.8 F (37.1 C) 98 F (36.7 C)  TempSrc:   Oral Oral  SpO2: 97% 97% 97% 96%  Weight:      Height:       General exam: Awake, laying in bed, in  nad Respiratory system: Normal respiratory effort, no wheezing Cardiovascular system: regular rate, s1, s2 Gastrointestinal system: Soft, nondistended, positive BS Central nervous system: CN2-12 grossly intact, strength intact Extremities: Perfused, no clubbing Skin: Normal skin turgor, no notable skin lesions seen Psychiatry: Mood normal // no visual hallucinations   Data Reviewed:  Labs reviewed: Na 138, K 3.5, Cr 1.49, Alk phos 121, AST 2.8, ALT 16  Family Communication: pt in room, family at bedside  Disposition: Status is: Inpatient Continue inpatient stay because: Severity of illness  Planned Discharge Destination: Skilled nursing facility    Author: Rickey Barbara, MD 05/21/2023 3:40 PM  For on call review [email or web address].

## 2023-05-21 NOTE — Plan of Care (Signed)
  Problem: Activity: Goal: Risk for activity intolerance will decrease Outcome: Progressing   Problem: Coping: Goal: Level of anxiety will decrease Outcome: Progressing   Problem: Elimination: Goal: Will not experience complications related to bowel motility Outcome: Progressing   

## 2023-05-21 NOTE — Progress Notes (Signed)
Orthopedic Tech Progress Note Patient Details:  Katelyn Jackson 1946/06/03 [id number]  Ortho Devices Type of Ortho Device: Knee Immobilizer Ortho Device/Splint Location: LLE Ortho Device/Splint Interventions: Ordered, Application, Adjustment   Post Interventions Patient Tolerated: Well Instructions Provided: Care of device, Adjustment of device Verbal order received for new knee immobilizer due to previous one being soiled. Darleen Crocker 05/21/2023, 12:13 PM

## 2023-05-22 DIAGNOSIS — S82002A Unspecified fracture of left patella, initial encounter for closed fracture: Secondary | ICD-10-CM | POA: Diagnosis not present

## 2023-05-22 DIAGNOSIS — W19XXXA Unspecified fall, initial encounter: Secondary | ICD-10-CM | POA: Diagnosis not present

## 2023-05-22 DIAGNOSIS — S42002A Fracture of unspecified part of left clavicle, initial encounter for closed fracture: Secondary | ICD-10-CM | POA: Diagnosis not present

## 2023-05-22 LAB — CBC
HCT: 36.1 % (ref 36.0–46.0)
Hemoglobin: 11.3 g/dL — ABNORMAL LOW (ref 12.0–15.0)
MCH: 29.8 pg (ref 26.0–34.0)
MCHC: 31.3 g/dL (ref 30.0–36.0)
MCV: 95.3 fL (ref 80.0–100.0)
Platelets: 142 10*3/uL — ABNORMAL LOW (ref 150–400)
RBC: 3.79 MIL/uL — ABNORMAL LOW (ref 3.87–5.11)
RDW: 15.5 % (ref 11.5–15.5)
WBC: 4.9 10*3/uL (ref 4.0–10.5)
nRBC: 0 % (ref 0.0–0.2)

## 2023-05-22 LAB — COMPREHENSIVE METABOLIC PANEL
ALT: 14 U/L (ref 0–44)
AST: 16 U/L (ref 15–41)
Albumin: 2.8 g/dL — ABNORMAL LOW (ref 3.5–5.0)
Alkaline Phosphatase: 108 U/L (ref 38–126)
Anion gap: 7 (ref 5–15)
BUN: 22 mg/dL (ref 8–23)
CO2: 30 mmol/L (ref 22–32)
Calcium: 8.9 mg/dL (ref 8.9–10.3)
Chloride: 100 mmol/L (ref 98–111)
Creatinine, Ser: 1.47 mg/dL — ABNORMAL HIGH (ref 0.44–1.00)
GFR, Estimated: 37 mL/min — ABNORMAL LOW (ref >60)
Glucose, Bld: 90 mg/dL (ref 70–99)
Potassium: 3.5 mmol/L (ref 3.5–5.1)
Sodium: 137 mmol/L (ref 135–145)
Total Bilirubin: 1.1 mg/dL (ref 0.3–1.2)
Total Protein: 6 g/dL — ABNORMAL LOW (ref 6.5–8.1)

## 2023-05-22 NOTE — Progress Notes (Signed)
Physical Therapy Treatment Patient Details Name: Katelyn Jackson MRN: [id number] DOB: 1946/04/09 Today's Date: 05/22/2023   History of Present Illness 77 y.o. female who presents after a fall at home. CT and Xray show a L distal clavicle fx and a L patella fx. Past medical history significant of hypertension, hyperlipidemia, chronic atrial fibrillation, s/p permanent pacemaker, hypothyroidism, CKD stage 3, lymphedema and history of multidrug-resistant bacteria    PT Comments  Pt able to understand good amount of English, but prefers use of interpreter as well for clarification. Pt premedicated and eager to get out of bed to chair. Pt able to turn to R and then L for placement of lift pad. Maximove utilized to transfer from bed to chair with assistance keeping L LE supported until supported by foot rest of recliner. Once in recliner able to participate in RLE therapeutic exercise. D/c plan remains appropriate.      Assistance Recommended at Discharge Frequent or constant Supervision/Assistance  If plan is discharge home, recommend the following:  Can travel by private vehicle    Two people to help with walking and/or transfers;Two people to help with bathing/dressing/bathroom;Assistance with cooking/housework;Direct supervision/assist for medications management;Assistance with feeding;Direct supervision/assist for financial management;Assist for transportation;Help with stairs or ramp for entrance   No  Equipment Recommendations  Hospital bed (hoyer lift)       Precautions / Restrictions Precautions Precautions: Fall Precaution Comments: hospitalization result of fall Required Braces or Orthoses: Knee Immobilizer - Left;Sling (Left) Knee Immobilizer - Left: On at all times Restrictions Weight Bearing Restrictions: Yes RUE Weight Bearing:  (limited shoulder movement) LUE Weight Bearing: Non weight bearing LLE Weight Bearing: Non weight bearing     Mobility  Bed Mobility Overal  bed mobility: Needs Assistance Bed Mobility: Rolling Rolling: Max assist         General bed mobility comments: maxA with use of bed pad for support along L LE with movement, rolls better to R    Transfers Overall transfer level: Needs assistance   Transfers: Bed to chair/wheelchair/BSC             General transfer comment: pt with good tolerance with support of L LE in extension with transfer to chair Transfer via Lift Equipment: Maximove (maxisky room but not charged during session so Maximove utilized and [employer] in room)      Balance Overall balance assessment: History of Falls                                          Cognition Arousal/Alertness: Awake/alert Behavior During Therapy: WFL for tasks assessed/performed Overall Cognitive Status: Within Functional Limits for tasks assessed                                 General Comments: pt understands good amount of English but appreciates ipad intrepreter for clarity        Exercises General Exercises - Lower Extremity Ankle Circles/Pumps: Seated, 10 reps, Both, AROM Quad Sets: AAROM, Right, 10 reps, Seated Gluteal Sets: Seated, 10 reps, Both, AROM Heel Slides: Seated, 10 reps, Both, AROM Hip ABduction/ADduction: Seated, 10 reps, Both, AROM Straight Leg Raises: Seated, 10 reps, Both, AROM    General Comments General comments (skin integrity, edema, etc.): VsS on RA      Pertinent Vitals/Pain Pain Assessment Pain Assessment: 0-10 Pain  Score: 4  Pain Location: with L sided movement Pain Descriptors / Indicators: Discomfort, Grimacing, Guarding Pain Intervention(s): Limited activity within patient's tolerance, Monitored during session, Premedicated before session, Repositioned    Home Living Family/patient expects to be discharged to:: Private residence Living Arrangements: Children (son and daughter in-law) Available Help at Discharge: Family;Available  PRN/intermittently Type of Home: House Home Access: Ramped entrance       Home Layout: Multi-level;Able to live on main level with bedroom/bathroom Home Equipment: Tub bench;Wheelchair - Forensic psychologist (2 wheels);Rollator (4 wheels)          PT Goals (current goals can now be found in the care plan section) Acute Rehab PT Goals PT Goal Formulation: With patient Time For Goal Achievement: 06/02/23 Potential to Achieve Goals: Fair Progress towards PT goals: Progressing toward goals    Frequency    Min 3X/week      PT Plan Current plan remains appropriate       AM-PAC PT "6 Clicks" Mobility   Outcome Measure  Help needed turning from your back to your side while in a flat bed without using bedrails?: Total Help needed moving from lying on your back to sitting on the side of a flat bed without using bedrails?: Total Help needed moving to and from a bed to a chair (including a wheelchair)?: Total Help needed standing up from a chair using your arms (e.g., wheelchair or bedside chair)?: Total Help needed to walk in hospital room?: Total Help needed climbing 3-5 steps with a railing? : Total 6 Click Score: 6    End of Session   Activity Tolerance: Patient limited by pain Patient left: in bed;with call bell/phone within reach;with bed alarm set Nurse Communication: Mobility status;Other (comment) (pt request to return to home medications, need for L LE to be maintained in knee extension, do not chuck knees or place bolster under knee,) PT Visit Diagnosis: Unsteadiness on feet (R26.81);Muscle weakness (generalized) (M62.81);History of falling (Z91.81);Pain Pain - Right/Left: Left Pain - part of body: Shoulder;Knee     Time: 9629-5284 PT Time Calculation (min) (ACUTE ONLY): 27 min  Charges:    $Therapeutic Exercise: 8-22 mins $Therapeutic Activity: 8-22 mins PT General Charges $$ ACUTE PT VISIT: 1 Visit                     Williemae Muriel B. Beverely Risen PT,  DPT Acute Rehabilitation Services Please use secure chat or  Call Office [phone]    [facility] 05/22/2023, 9:58 AM

## 2023-05-22 NOTE — Progress Notes (Signed)
Progress Note   Patient: Katelyn Jackson ZOX:[id number] DOB: 07/08/1946 DOA: 05/17/2023     2 DOS: the patient was seen and examined on 05/22/2023   Brief hospital course: 77 y.o. female with medical history significant of hypertension, hyperlipidemia, chronic atrial fibrillation, s/p permanent pacemaker, hypothyroidism, CKD stage 3, lymphedema and history of multidrug-resistant bacteria who presents after a fall.  History is obtained with use of with use of the iPad interpreter services. At baseline patient is wheelchair-bound, but has been able to transfer without assistance.  She reports that yesterday she ate gone to the restroom and while trying to transfer from the toilet seat back to her wheelchair got dizzy and fell.  She thinks that she hit her head and may have lost consciousness briefly, but is not sure.  She landed on her left side and reported immediate pain in her left knee and shoulder.  She reported having some nausea after receiving pain meds, but denied having any abdominal pain or dysuria symptoms prior to coming to the hospital.   In the emergency department patient was noted to initially be afebrile with pulse 48-63, respiration 14-26 and all other signs maintained.  Labs from 7/10 noted hemoglobin 11.3, BUN 25, creatinine 1.48, alkaline phosphatase 148, and INR 1.4.  X-rays of the left knee noted a communicated fracture of the patella with x-rays of the shoulder noting a distal clavicle fracture.  Chest x-ray noted cardiomegaly with vascular congestion. Urinalysis noted moderate hemoglobin, large leukocytes, few bacteria, 21-50 RBC/hpf,  and greater than 50 WBCs.  CT scan of the left knee noted displaced fracture through the inferior pole of the left patella with moderate joint effusion.  Patient has been given fentanyl IV.  Orthopedics have been consulted.   Patient was placed in a left knee immobilizer as well as sling for the left arm.  Assessment and Plan: Communicated fracture  of the patella and distal clavicle fracture secondary to fall Patient presents after having a fall while trying to transfer from commode to her wheelchair.  Found to have communicated fracture of the patella and left distal clavicle fracture.   -Orthopedics consulted, but had recommended nonoperative management and pain control. -Transitions of care consulted for placement -continue analgesia as needed   UTI Urinalysis noted moderate hemoglobin, large leukocytes, few bacteria, 21-50 RBC/hpf,  and greater than 50 WBCs.  Last available urine culture from 08/2021 noted 50,000 colonies of vancomycin resistant Enterococcus. -Pt initially reported no symptoms, however on 7/13, pt did complain of dysuria with suprapubic discomfort -Urine cx without significant growth. Treated with 3 days of empiric rocephin   Chronic atrial fibrillation on chronic anticoagulation S/p permanent pacemaker Patient noted to be in a paced rhythm. -Continue Eliquis    Essential hypertension Blood pressures range from 114/56-172/83 -Continue Coreg as tolerated with holding parameters.   CKD stage IIIb -Continue to monitor -renal function seems stable -Recheck bmet in AM   Hypothyroidism -Continue levothyroxine   History of breast cancer -Continue tamoxifen   History of gout -Continue allopurinol  Cirrhosis, diagnosed 2022 -Suspected NASH cirrhosis -continued on lactulose and xifaxan, titrate to ensure 2-3 BM per day -LFT's remain stable, cont to monitor      Subjective: Reports abd discomfort has improved   Physical Exam: Vitals:   05/21/23 2115 05/22/23 0511 05/22/23 0736 05/22/23 1324  BP: 125/62 (!) 116/52 (!) 96/44 130/68  Pulse: 63 63 64 61  Resp: 19 18 16 17   Temp: 98.1 F (36.7 C) 98.6 F (37 C)  98.2 F (36.8 C) 98.4 F (36.9 C)  TempSrc: Oral   Oral  SpO2: 100% 95% 100% 100%  Weight:      Height:       General exam: Conversant, in no acute distress Respiratory system: normal  chest rise, clear, no audible wheezing Cardiovascular system: regular rhythm, s1-s2 Gastrointestinal system: Nondistended, nontender, pos BS Central nervous system: No seizures, no tremors Extremities: No cyanosis, no joint deformities Skin: No rashes, no pallor Psychiatry: Affect normal // no auditory hallucinations   Data Reviewed:  Labs reviewed: Na 137, K 3.5, Cr 1.47, WBC 4.9, Hgb 11.3, Plts 142  Family Communication: pt in room, family not at bedside  Disposition: Status is: Inpatient Continue inpatient stay because: Severity of illness  Planned Discharge Destination: Skilled nursing facility    Author: Rickey Barbara, MD 05/22/2023 3:40 PM  For on call review [email or web address].

## 2023-05-22 NOTE — Plan of Care (Signed)

## 2023-05-22 NOTE — Plan of Care (Signed)
   Problem: Education: Goal: Knowledge of General Education information will improve Description: Including pain rating scale, medication(s)/side effects and non-pharmacologic comfort measures Outcome: Progressing   Problem: Health Behavior/Discharge Planning: Goal: Ability to manage health-related needs will improve Outcome: Progressing   Problem: Activity: Goal: Risk for activity intolerance will decrease Outcome: Progressing   Problem: Coping: Goal: Level of anxiety will decrease Outcome: Progressing   Problem: Elimination: Goal: Will not experience complications related to bowel motility Outcome: Progressing   Problem: Pain Managment: Goal: General experience of comfort will improve Outcome: Progressing

## 2023-05-22 NOTE — TOC Progression Note (Addendum)
Transition of Care [facility]) - Progression Note    Patient Details  Name: Katelyn Jackson MRN: [id number] Date of Birth: 1946/09/07  Transition of [facility]) CM/SW Contact  Lorri Frederick, LCSW Phone Number: 05/22/2023, 11:39 AM  Clinical Narrative:   CSW spoke with pt son [name].  He will be at the hospital at 1300 to discuss.    1335: CSW spoke with pt son [name] in room.  Bed offers on medicare choice document provided.  He is asking for responses from [facility], [facility], [facility], [facility], [facility].  CSW reached out to those facilities.    [facility] is full.    1520: [name] and [facility] both offer beds.  Son Jeb Levering updated and wants to accept offer at [facility].  CSW confirmed with [facility] that they can receive pt tomorrow.   Auth request submitted in [place] and approved: [id number], 3 days: 7/16-7/18.   Expected Discharge Plan: Skilled Nursing Facility Barriers to Discharge: Insurance Authorization, SNF Pending bed offer  Expected Discharge Plan and Services       Living arrangements for the past 2 months: Single Family Home                                       Social Determinants of Health (SDOH) Interventions SDOH Screenings   Food Insecurity: No Food Insecurity (05/18/2023)  Housing: Patient Unable To Answer (05/18/2023)  Transportation Needs: No Transportation Needs (05/18/2023)  Utilities: Not At Risk (05/18/2023)  Alcohol Screen: Low Risk  (06/15/2022)  Depression (PHQ2-9): Low Risk  (04/17/2023)  Financial Resource Strain: Low Risk  (06/15/2022)  Physical Activity: Inactive (06/15/2022)  Social Connections: Socially Isolated (06/15/2022)  Stress: No Stress Concern Present (06/15/2022)  Tobacco Use: Low Risk  (05/18/2023)    Readmission Risk Interventions    07/06/2021   11:49 AM 03/19/2021    3:03 PM  Readmission Risk Prevention Plan  Transportation Screening Complete Complete  HRI or Home Care Consult  Complete  Social Work Consult  for Recovery Care Planning/Counseling  Complete  Palliative Care Screening  Not Applicable  Medication Review [employer])  Complete  HRI or Home Care Consult Complete   SW Recovery Care/Counseling Consult Complete   Palliative Care Screening Not Applicable   Skilled Nursing Facility Complete

## 2023-05-22 NOTE — Care Management Important Message (Signed)
Important Message  Patient Details  Name: Katelyn Jackson MRN: [id number] Date of Birth: Sep 04, 1946   Medicare Important Message Given:  Yes     Sherilyn Banker 05/22/2023, 3:56 PM

## 2023-05-23 ENCOUNTER — Inpatient Hospital Stay (HOSPITAL_COMMUNITY): Payer: Medicare HMO

## 2023-05-23 DIAGNOSIS — S82002A Unspecified fracture of left patella, initial encounter for closed fracture: Secondary | ICD-10-CM | POA: Diagnosis not present

## 2023-05-23 DIAGNOSIS — S42002A Fracture of unspecified part of left clavicle, initial encounter for closed fracture: Secondary | ICD-10-CM | POA: Diagnosis not present

## 2023-05-23 DIAGNOSIS — W19XXXA Unspecified fall, initial encounter: Secondary | ICD-10-CM | POA: Diagnosis not present

## 2023-05-23 LAB — COMPREHENSIVE METABOLIC PANEL
ALT: 14 U/L (ref 0–44)
AST: 18 U/L (ref 15–41)
Albumin: 3.1 g/dL — ABNORMAL LOW (ref 3.5–5.0)
Alkaline Phosphatase: 126 U/L (ref 38–126)
Anion gap: 11 (ref 5–15)
BUN: 24 mg/dL — ABNORMAL HIGH (ref 8–23)
CO2: 29 mmol/L (ref 22–32)
Calcium: 9.3 mg/dL (ref 8.9–10.3)
Chloride: 98 mmol/L (ref 98–111)
Creatinine, Ser: 1.44 mg/dL — ABNORMAL HIGH (ref 0.44–1.00)
GFR, Estimated: 37 mL/min — ABNORMAL LOW (ref >60)
Glucose, Bld: 96 mg/dL (ref 70–99)
Potassium: 3.2 mmol/L — ABNORMAL LOW (ref 3.5–5.1)
Sodium: 138 mmol/L (ref 135–145)
Total Bilirubin: 1.1 mg/dL (ref 0.3–1.2)
Total Protein: 6.6 g/dL (ref 6.5–8.1)

## 2023-05-23 LAB — CBC
HCT: 37.6 % (ref 36.0–46.0)
Hemoglobin: 11.5 g/dL — ABNORMAL LOW (ref 12.0–15.0)
MCH: 28.7 pg (ref 26.0–34.0)
MCHC: 30.6 g/dL (ref 30.0–36.0)
MCV: 93.8 fL (ref 80.0–100.0)
Platelets: 144 10*3/uL — ABNORMAL LOW (ref 150–400)
RBC: 4.01 MIL/uL (ref 3.87–5.11)
RDW: 15.4 % (ref 11.5–15.5)
WBC: 5 10*3/uL (ref 4.0–10.5)
nRBC: 0 % (ref 0.0–0.2)

## 2023-05-23 MED ORDER — POTASSIUM CHLORIDE CRYS ER 20 MEQ PO TBCR
40.0000 meq | EXTENDED_RELEASE_TABLET | ORAL | Status: AC
  Administered 2023-05-23 (×2): 40 meq via ORAL
  Filled 2023-05-23 (×2): qty 2

## 2023-05-23 MED ORDER — LACTULOSE 10 GM/15ML PO SOLN
10.0000 g | Freq: Three times a day (TID) | ORAL | Status: DC
  Filled 2023-05-23: qty 15

## 2023-05-23 MED ORDER — LACTULOSE 10 GM/15ML PO SOLN
15.0000 g | Freq: Three times a day (TID) | ORAL | Status: DC
  Administered 2023-05-23 – 2023-05-24 (×3): 15 g via ORAL
  Filled 2023-05-23 (×3): qty 30

## 2023-05-23 NOTE — Plan of Care (Signed)

## 2023-05-23 NOTE — Progress Notes (Signed)
Occupational Therapy Treatment Patient Details Name: Katelyn Jackson MRN: [id number] DOB: Apr 22, 1946 Today's Date: 05/23/2023   History of present illness 77 y.o. female who presents after a fall at home. CT and Xray show a L distal clavicle fx and a L patella fx. Past medical history significant of hypertension, hyperlipidemia, chronic atrial fibrillation, s/p permanent pacemaker, hypothyroidism, CKD stage 3, lymphedema and history of multidrug-resistant bacteria   OT comments  Pt not feeling well today, lots of pain in L shoulder/knee. Arrived with Pt LUE not fully in sling resting on bed on L side. Asked Pt to attempt to readjust sling, unable to, total A for readjusting position in bed. Pt instructed on hand/wrist/elbow AROM exercises, given squeeze ball to maintain strength due to current restrictions in mobility. Pt refused to sit EOB or transfer to chair with maximove today due to pain. Pt DC plan still appropriate, will follow acutely to progress as able.   Recommendations for follow up therapy are one component of a multi-disciplinary discharge planning process, led by the attending physician.  Recommendations may be updated based on patient status, additional functional criteria and insurance authorization.    Assistance Recommended at Discharge Frequent or constant Supervision/Assistance  Patient can return home with the following  A lot of help with walking and/or transfers;Two people to help with bathing/dressing/bathroom;Assistance with cooking/housework;Assist for transportation;Assistance with feeding;Help with stairs or ramp for entrance   Equipment Recommendations  Other (comment) (defer)    Recommendations for Other Services      Precautions / Restrictions Precautions Precautions: Fall Required Braces or Orthoses: Knee Immobilizer - Left;Sling Knee Immobilizer - Left: On at all times Restrictions Weight Bearing Restrictions: Yes RUE Weight Bearing:  (limited ROM, RTC  tears) LUE Weight Bearing: Non weight bearing LLE Weight Bearing: Non weight bearing       Mobility Bed Mobility Overal bed mobility: Needs Assistance Bed Mobility: Rolling Rolling: Max assist              Transfers Overall transfer level: Needs assistance                   Transfer via Lift Equipment: Maximove   Balance                                           ADL either performed or assessed with clinical judgement   ADL Overall ADL's : Needs assistance/impaired                                       General ADL Comments: Pt currently total A for feeding, bathing, grooming, toileting and dressing ADLs. She is currenlty bed level due to mobility limitations    Extremity/Trunk Assessment Upper Extremity Assessment Upper Extremity Assessment: RUE deficits/detail;LUE deficits/detail RUE Deficits / Details: RTC tears, limited flexion/ABD, unable to use RUE for feeding/UB ADLs RUE: Shoulder pain with ROM RUE Coordination: decreased gross motor LUE Deficits / Details: NWB LUE, limited shoulder ROM due to pain LUE: Shoulder pain at rest;Shoulder pain with ROM LUE Coordination: decreased gross motor            Vision       Perception     Praxis      Cognition Arousal/Alertness: Awake/alert Behavior During Therapy: WFL for tasks assessed/performed Overall Cognitive Status: Within  Functional Limits for tasks assessed                                          Exercises Exercises: Hand exercises Hand Exercises Digit Composite Flexion: Strengthening, Squeeze ball    Shoulder Instructions       General Comments      Pertinent Vitals/ Pain       Pain Assessment Pain Assessment: 0-10 Pain Score: 6  Faces Pain Scale: Hurts even more Pain Location: L shoudler and knee at rest, increases with movement Pain Descriptors / Indicators: Discomfort, Grimacing, Guarding Pain Intervention(s): Limited  activity within patient's tolerance  Home Living                                          Prior Functioning/Environment              Frequency  Min 1X/week        Progress Toward Goals  OT Goals(current goals can now be found in the care plan section)  Progress towards OT goals: Progressing toward goals  Acute Rehab OT Goals Patient Stated Goal: to reduce pain at shoulder OT Goal Formulation: With patient Time For Goal Achievement: 06/02/23 Potential to Achieve Goals: Good ADL Goals Pt Will Perform Eating: with max assist;bed level Pt Will Perform Grooming: with max assist;bed level Additional ADL Goal #2: Pt will verbalize understanding of how to doff/don sling  Plan Discharge plan remains appropriate    Co-evaluation                 AM-PAC OT "6 Clicks" Daily Activity     Outcome Measure   Help from another person eating meals?: Total Help from another person taking care of personal grooming?: Total Help from another person toileting, which includes using toliet, bedpan, or urinal?: Total Help from another person bathing (including washing, rinsing, drying)?: Total Help from another person to put on and taking off regular upper body clothing?: Total Help from another person to put on and taking off regular lower body clothing?: Total 6 Click Score: 6    End of Session    OT Visit Diagnosis: Other abnormalities of gait and mobility (R26.89);Unsteadiness on feet (R26.81);History of falling (Z91.81);Pain Pain - Right/Left: Left Pain - part of body: Shoulder;Knee   Activity Tolerance Other (comment) (limited due to mobility restrictions)   Patient Left in bed;with call bell/phone within reach   Nurse Communication Mobility status        Time: 4540-9811 OT Time Calculation (min): 10 min  Charges: OT General Charges $OT Visit: 1 Visit OT Treatments $Therapeutic Activity: 8-22 mins  [place], OTR/L   Alexis Goodell 05/23/2023, 3:10 PM

## 2023-05-23 NOTE — Progress Notes (Signed)
Progress Note   Patient: Katelyn Jackson NWG:[id number] DOB: Feb 11, 1946 DOA: 05/17/2023     3 DOS: the patient was seen and examined on 05/23/2023   Brief hospital course: 77 y.o. female with medical history significant of hypertension, hyperlipidemia, chronic atrial fibrillation, s/p permanent pacemaker, hypothyroidism, CKD stage 3, lymphedema and history of multidrug-resistant bacteria who presents after a fall.  History is obtained with use of with use of the iPad interpreter services. At baseline patient is wheelchair-bound, but has been able to transfer without assistance.  She reports that yesterday she ate gone to the restroom and while trying to transfer from the toilet seat back to her wheelchair got dizzy and fell.  She thinks that she hit her head and may have lost consciousness briefly, but is not sure.  She landed on her left side and reported immediate pain in her left knee and shoulder.  She reported having some nausea after receiving pain meds, but denied having any abdominal pain or dysuria symptoms prior to coming to the hospital.   In the emergency department patient was noted to initially be afebrile with pulse 48-63, respiration 14-26 and all other signs maintained.  Labs from 7/10 noted hemoglobin 11.3, BUN 25, creatinine 1.48, alkaline phosphatase 148, and INR 1.4.  X-rays of the left knee noted a communicated fracture of the patella with x-rays of the shoulder noting a distal clavicle fracture.  Chest x-ray noted cardiomegaly with vascular congestion. Urinalysis noted moderate hemoglobin, large leukocytes, few bacteria, 21-50 RBC/hpf,  and greater than 50 WBCs.  CT scan of the left knee noted displaced fracture through the inferior pole of the left patella with moderate joint effusion.  Patient has been given fentanyl IV.  Orthopedics have been consulted.   Patient was placed in a left knee immobilizer as well as sling for the left arm.  Assessment and Plan: Communicated fracture  of the patella and distal clavicle fracture secondary to fall Patient presents after having a fall while trying to transfer from commode to her wheelchair.  Found to have communicated fracture of the patella and left distal clavicle fracture.   -Orthopedics consulted, but had recommended nonoperative management and pain control. -TOC following for placement -continue analgesia as needed   UTI Urinalysis noted moderate hemoglobin, large leukocytes, few bacteria, 21-50 RBC/hpf,  and greater than 50 WBCs.  Last available urine culture from 08/2021 noted 50,000 colonies of vancomycin resistant Enterococcus. -Pt initially reported no symptoms, however on 7/13, pt did complain of dysuria with suprapubic discomfort -Urine cx without significant growth. Pt completed 3 days of empiric rocephin   Chronic atrial fibrillation on chronic anticoagulation S/p permanent pacemaker Patient noted to be in a paced rhythm. -Continue Eliquis    Essential hypertension -BP remains stable -Continue Coreg as tolerated with holding parameters.   CKD stage IIIb -Continue to monitor -renal function remains stable -Recheck bmet in AM   Hypothyroidism -Continue levothyroxine   History of breast cancer -Continue tamoxifen   History of gout -Continue allopurinol  Cirrhosis, diagnosed 2022 -Suspected NASH cirrhosis -continued on lactulose and xifaxan, no BM yesterday -Increase lactulose to pt's home dose of 15g TID. Goal 2-3 BM daily -LFT's remain stable, recheck CMP in AM      Subjective: Complains of acute L shoulder pain after blood draw this AM   Physical Exam: Vitals:   05/22/23 2050 05/23/23 0515 05/23/23 0803 05/23/23 1324  BP: 137/68 130/69 (!) 144/62 129/67  Pulse: 61 66 63 64  Resp: 19 19 17  17  Temp: 98.1 F (36.7 C) 98.3 F (36.8 C) 98 F (36.7 C) 98.7 F (37.1 C)  TempSrc: Oral   Oral  SpO2: 100% 96% 92% 100%  Weight:      Height:       General exam: Conversant, in no acute  distress Respiratory system: normal chest rise, clear, no audible wheezing Cardiovascular system: regular rhythm, s1-s2 Gastrointestinal system: Nondistended, nontender, pos BS Central nervous system: No seizures, no tremors Extremities: No cyanosis, no joint deformities Skin: No rashes, no pallor Psychiatry: Affect normal // no auditory hallucinations   Data Reviewed:  Labs reviewed: Na 137, K 3.5, Cr 1.47, WBC 4.9, Hgb 11.3, Plts 142  Family Communication: pt in room, family not at bedside  Disposition: Status is: Inpatient Continue inpatient stay because: Severity of illness  Planned Discharge Destination: Skilled nursing facility    Author: Rickey Barbara, MD 05/23/2023 3:33 PM  For on call review [email or web address].

## 2023-05-23 NOTE — Plan of Care (Signed)
  Problem: Education: Goal: Knowledge of General Education information will improve Description: Including pain rating scale, medication(s)/side effects and non-pharmacologic comfort measures Outcome: Progressing   Problem: Activity: Goal: Risk for activity intolerance will decrease Outcome: Progressing   Problem: Nutrition: Goal: Adequate nutrition will be maintained Outcome: Progressing   Problem: Elimination: Goal: Will not experience complications related to bowel motility Outcome: Progressing   Problem: Pain Managment: Goal: General experience of comfort will improve Outcome: Progressing   

## 2023-05-24 DIAGNOSIS — S0990XD Unspecified injury of head, subsequent encounter: Secondary | ICD-10-CM | POA: Diagnosis not present

## 2023-05-24 DIAGNOSIS — Z79899 Other long term (current) drug therapy: Secondary | ICD-10-CM | POA: Diagnosis not present

## 2023-05-24 DIAGNOSIS — I272 Pulmonary hypertension, unspecified: Secondary | ICD-10-CM | POA: Diagnosis not present

## 2023-05-24 DIAGNOSIS — S82002D Unspecified fracture of left patella, subsequent encounter for closed fracture with routine healing: Secondary | ICD-10-CM | POA: Diagnosis not present

## 2023-05-24 DIAGNOSIS — E876 Hypokalemia: Secondary | ICD-10-CM | POA: Diagnosis not present

## 2023-05-24 DIAGNOSIS — K5641 Fecal impaction: Secondary | ICD-10-CM | POA: Diagnosis not present

## 2023-05-24 DIAGNOSIS — Z4789 Encounter for other orthopedic aftercare: Secondary | ICD-10-CM | POA: Insufficient documentation

## 2023-05-24 DIAGNOSIS — R1084 Generalized abdominal pain: Secondary | ICD-10-CM | POA: Diagnosis not present

## 2023-05-24 DIAGNOSIS — Z48812 Encounter for surgical aftercare following surgery on the circulatory system: Secondary | ICD-10-CM | POA: Diagnosis not present

## 2023-05-24 DIAGNOSIS — R109 Unspecified abdominal pain: Secondary | ICD-10-CM | POA: Diagnosis not present

## 2023-05-24 DIAGNOSIS — N390 Urinary tract infection, site not specified: Secondary | ICD-10-CM | POA: Diagnosis not present

## 2023-05-24 DIAGNOSIS — W19XXXD Unspecified fall, subsequent encounter: Secondary | ICD-10-CM | POA: Diagnosis present

## 2023-05-24 DIAGNOSIS — K5289 Other specified noninfective gastroenteritis and colitis: Secondary | ICD-10-CM | POA: Diagnosis present

## 2023-05-24 DIAGNOSIS — I5032 Chronic diastolic (congestive) heart failure: Secondary | ICD-10-CM | POA: Diagnosis not present

## 2023-05-24 DIAGNOSIS — I129 Hypertensive chronic kidney disease with stage 1 through stage 4 chronic kidney disease, or unspecified chronic kidney disease: Secondary | ICD-10-CM | POA: Diagnosis present

## 2023-05-24 DIAGNOSIS — G8911 Acute pain due to trauma: Secondary | ICD-10-CM | POA: Diagnosis not present

## 2023-05-24 DIAGNOSIS — S82002A Unspecified fracture of left patella, initial encounter for closed fracture: Secondary | ICD-10-CM | POA: Diagnosis not present

## 2023-05-24 DIAGNOSIS — L89893 Pressure ulcer of other site, stage 3: Secondary | ICD-10-CM | POA: Diagnosis not present

## 2023-05-24 DIAGNOSIS — K567 Ileus, unspecified: Secondary | ICD-10-CM | POA: Diagnosis not present

## 2023-05-24 DIAGNOSIS — E785 Hyperlipidemia, unspecified: Secondary | ICD-10-CM | POA: Diagnosis not present

## 2023-05-24 DIAGNOSIS — W19XXXA Unspecified fall, initial encounter: Secondary | ICD-10-CM | POA: Diagnosis not present

## 2023-05-24 DIAGNOSIS — D259 Leiomyoma of uterus, unspecified: Secondary | ICD-10-CM | POA: Diagnosis not present

## 2023-05-24 DIAGNOSIS — Z743 Need for continuous supervision: Secondary | ICD-10-CM | POA: Diagnosis not present

## 2023-05-24 DIAGNOSIS — Z9181 History of falling: Secondary | ICD-10-CM | POA: Insufficient documentation

## 2023-05-24 DIAGNOSIS — M1A9XX0 Chronic gout, unspecified, without tophus (tophi): Secondary | ICD-10-CM | POA: Diagnosis present

## 2023-05-24 DIAGNOSIS — I4891 Unspecified atrial fibrillation: Secondary | ICD-10-CM | POA: Diagnosis not present

## 2023-05-24 DIAGNOSIS — K449 Diaphragmatic hernia without obstruction or gangrene: Secondary | ICD-10-CM | POA: Diagnosis not present

## 2023-05-24 DIAGNOSIS — Z978 Presence of other specified devices: Secondary | ICD-10-CM | POA: Diagnosis not present

## 2023-05-24 DIAGNOSIS — N200 Calculus of kidney: Secondary | ICD-10-CM | POA: Diagnosis not present

## 2023-05-24 DIAGNOSIS — N139 Obstructive and reflux uropathy, unspecified: Secondary | ICD-10-CM | POA: Diagnosis present

## 2023-05-24 DIAGNOSIS — R531 Weakness: Secondary | ICD-10-CM | POA: Diagnosis not present

## 2023-05-24 DIAGNOSIS — S82092A Other fracture of left patella, initial encounter for closed fracture: Secondary | ICD-10-CM | POA: Diagnosis not present

## 2023-05-24 DIAGNOSIS — R2681 Unsteadiness on feet: Secondary | ICD-10-CM | POA: Diagnosis not present

## 2023-05-24 DIAGNOSIS — Z880 Allergy status to penicillin: Secondary | ICD-10-CM | POA: Diagnosis not present

## 2023-05-24 DIAGNOSIS — Z950 Presence of cardiac pacemaker: Secondary | ICD-10-CM | POA: Diagnosis not present

## 2023-05-24 DIAGNOSIS — Z853 Personal history of malignant neoplasm of breast: Secondary | ICD-10-CM | POA: Diagnosis not present

## 2023-05-24 DIAGNOSIS — I482 Chronic atrial fibrillation, unspecified: Secondary | ICD-10-CM | POA: Diagnosis not present

## 2023-05-24 DIAGNOSIS — Z6838 Body mass index (BMI) 38.0-38.9, adult: Secondary | ICD-10-CM | POA: Diagnosis not present

## 2023-05-24 DIAGNOSIS — N1832 Chronic kidney disease, stage 3b: Secondary | ICD-10-CM | POA: Diagnosis not present

## 2023-05-24 DIAGNOSIS — R9431 Abnormal electrocardiogram [ECG] [EKG]: Secondary | ICD-10-CM | POA: Diagnosis not present

## 2023-05-24 DIAGNOSIS — E860 Dehydration: Secondary | ICD-10-CM | POA: Diagnosis not present

## 2023-05-24 DIAGNOSIS — E039 Hypothyroidism, unspecified: Secondary | ICD-10-CM | POA: Diagnosis not present

## 2023-05-24 DIAGNOSIS — Z91048 Other nonmedicinal substance allergy status: Secondary | ICD-10-CM | POA: Diagnosis not present

## 2023-05-24 DIAGNOSIS — I890 Lymphedema, not elsewhere classified: Secondary | ICD-10-CM | POA: Diagnosis not present

## 2023-05-24 DIAGNOSIS — S42033A Displaced fracture of lateral end of unspecified clavicle, initial encounter for closed fracture: Secondary | ICD-10-CM | POA: Diagnosis not present

## 2023-05-24 DIAGNOSIS — K828 Other specified diseases of gallbladder: Secondary | ICD-10-CM | POA: Diagnosis not present

## 2023-05-24 DIAGNOSIS — S42002A Fracture of unspecified part of left clavicle, initial encounter for closed fracture: Secondary | ICD-10-CM | POA: Diagnosis not present

## 2023-05-24 DIAGNOSIS — S42002D Fracture of unspecified part of left clavicle, subsequent encounter for fracture with routine healing: Secondary | ICD-10-CM | POA: Diagnosis not present

## 2023-05-24 DIAGNOSIS — M109 Gout, unspecified: Secondary | ICD-10-CM | POA: Diagnosis not present

## 2023-05-24 DIAGNOSIS — I10 Essential (primary) hypertension: Secondary | ICD-10-CM | POA: Diagnosis not present

## 2023-05-24 DIAGNOSIS — K7460 Unspecified cirrhosis of liver: Secondary | ICD-10-CM | POA: Diagnosis not present

## 2023-05-24 DIAGNOSIS — I4821 Permanent atrial fibrillation: Secondary | ICD-10-CM | POA: Diagnosis present

## 2023-05-24 DIAGNOSIS — K219 Gastro-esophageal reflux disease without esophagitis: Secondary | ICD-10-CM | POA: Diagnosis not present

## 2023-05-24 DIAGNOSIS — R1032 Left lower quadrant pain: Secondary | ICD-10-CM | POA: Diagnosis not present

## 2023-05-24 DIAGNOSIS — L89152 Pressure ulcer of sacral region, stage 2: Secondary | ICD-10-CM | POA: Diagnosis not present

## 2023-05-24 DIAGNOSIS — L89892 Pressure ulcer of other site, stage 2: Secondary | ICD-10-CM | POA: Diagnosis not present

## 2023-05-24 DIAGNOSIS — Z79890 Hormone replacement therapy (postmenopausal): Secondary | ICD-10-CM | POA: Diagnosis not present

## 2023-05-24 DIAGNOSIS — K5900 Constipation, unspecified: Secondary | ICD-10-CM | POA: Diagnosis not present

## 2023-05-24 DIAGNOSIS — I959 Hypotension, unspecified: Secondary | ICD-10-CM | POA: Diagnosis not present

## 2023-05-24 DIAGNOSIS — Z923 Personal history of irradiation: Secondary | ICD-10-CM | POA: Diagnosis not present

## 2023-05-24 DIAGNOSIS — D649 Anemia, unspecified: Secondary | ICD-10-CM | POA: Diagnosis not present

## 2023-05-24 DIAGNOSIS — R110 Nausea: Secondary | ICD-10-CM | POA: Diagnosis not present

## 2023-05-24 DIAGNOSIS — E669 Obesity, unspecified: Secondary | ICD-10-CM | POA: Diagnosis present

## 2023-05-24 DIAGNOSIS — R296 Repeated falls: Secondary | ICD-10-CM | POA: Diagnosis not present

## 2023-05-24 LAB — COMPREHENSIVE METABOLIC PANEL
ALT: 14 U/L (ref 0–44)
AST: 23 U/L (ref 15–41)
Albumin: 3.3 g/dL — ABNORMAL LOW (ref 3.5–5.0)
Alkaline Phosphatase: 132 U/L — ABNORMAL HIGH (ref 38–126)
Anion gap: 9 (ref 5–15)
BUN: 23 mg/dL (ref 8–23)
CO2: 29 mmol/L (ref 22–32)
Calcium: 9.7 mg/dL (ref 8.9–10.3)
Chloride: 102 mmol/L (ref 98–111)
Creatinine, Ser: 1.51 mg/dL — ABNORMAL HIGH (ref 0.44–1.00)
GFR, Estimated: 35 mL/min — ABNORMAL LOW (ref >60)
Glucose, Bld: 104 mg/dL — ABNORMAL HIGH (ref 70–99)
Potassium: 3.8 mmol/L (ref 3.5–5.1)
Sodium: 140 mmol/L (ref 135–145)
Total Bilirubin: 1 mg/dL (ref 0.3–1.2)
Total Protein: 6.9 g/dL (ref 6.5–8.1)

## 2023-05-24 LAB — CBC
HCT: 40.4 % (ref 36.0–46.0)
Hemoglobin: 12.4 g/dL (ref 12.0–15.0)
MCH: 29.5 pg (ref 26.0–34.0)
MCHC: 30.7 g/dL (ref 30.0–36.0)
MCV: 96 fL (ref 80.0–100.0)
Platelets: 160 10*3/uL (ref 150–400)
RBC: 4.21 MIL/uL (ref 3.87–5.11)
RDW: 15.4 % (ref 11.5–15.5)
WBC: 5.2 10*3/uL (ref 4.0–10.5)
nRBC: 0 % (ref 0.0–0.2)

## 2023-05-24 NOTE — Discharge Summary (Signed)
Physician Discharge Summary  Katelyn Jackson VHQ:[id number] DOB: 1946/01/20 DOA: 05/17/2023  PCP: Tresa Garter, MD  Admit date: 05/17/2023 Discharge date: 05/24/2023 Admitted From: Home Disposition: SNF Recommendations for Outpatient Follow-up:  Follow-up with orthopedic surgery as below Check CBC and CMP in 1 week Please follow up on the following pending results: None   Discharge Condition: Stable CODE STATUS: Full code  Contact information for follow-up providers     [facility] Schedule an appointment as soon as possible for a visit .   Specialty: Specialist Contact information: [address] [place] [address] [phone]              Contact information for after-discharge care     Destination     [facility] .   Service: Skilled Nursing Contact information: [name] [place] [address] [phone]                     Hospital course 77 y.o. female with medical history significant of hypertension, hyperlipidemia, chronic atrial fibrillation, s/p permanent pacemaker, hypothyroidism, CKD stage 3B, lymphedema and history of multidrug-resistant bacteria who presents after a fall.  History is obtained with use of with use of the iPad interpreter services. At baseline patient is wheelchair-bound, but has been able to transfer without assistance.  She felt dizzy and fell when she was trying to transfer from toilet seat to a wheelchair the day prior to presentation.  She thinks that she hit her head and may have lost consciousness briefly, but is not sure.  She landed on her left side and reported immediate pain in her left knee and shoulder.  She reported having some nausea after receiving pain meds, but denied having any abdominal pain or dysuria symptoms prior to coming to the hospital.   In ED, vital stable except for mild bradycardia.  Labs without significant acute finding.  X-rays of the left  knee noted a communicated fracture of the patella with x-rays of the shoulder noting a distal clavicle fracture.  Chest x-ray noted cardiomegaly with vascular congestion. Urinalysis noted moderate hemoglobin, large leukocytes, few bacteria, 21-50 RBC/hpf,  and greater than 50 WBCs.  CT scan of the left knee noted displaced fracture through the inferior pole of the left patella with moderate joint effusion.  Patient has been given fentanyl IV.  Orthopedics have been consulted.   Patient was placed in a left knee immobilizer as well as sling for the left arm per recommendation by orthopedic surgery and admitted.  Orthopedic surgery recommended conservative management with knee immobilizer and a sling, and outpatient follow-up.  Therapy recommended SNF.  See individual problem list below for more.   Problems addressed during this hospitalization Principal Problem:   Fall Active Problems:   Patella fracture   Clavicle fracture   Abnormal urinalysis   Chronic atrial fibrillation (HCC)   History of pacemaker   Essential hypertension   CKD (chronic kidney disease), stage III (HCC)   Hypothyroidism   History of breast cancer   Closed patellar sleeve fracture of left knee   Communicated fracture of the patella and distal clavicle fracture secondary to fall -Orthopedics recommended nonoperative management with knee immobilizer and sling, and pain control. -Continue Norco for pain control -Already on Eliquis -Continue lactulose -[facility] at [facility] -Outpatient follow-up with orthopedic surgery as above   Urinary tract infection: Pt initially reported no symptoms, however on 7/13, pt did complain of dysuria with suprapubic discomfort.  Urinalysis noted moderate hemoglobin, large leukocytes, few bacteria, 21-50 RBC/hpf,  and greater than 50 WBCs.  Urine culture with insignificant growth.  Completed 3 days of ceftriaxone.   Chronic atrial fibrillation on chronic anticoagulation S/p permanent  pacemaker -Patient noted to be in a paced rhythm. -Continue Eliquis    Essential hypertension: Normotensive for most part. -Continue Coreg as tolerated with holding parameters.   CKD stage IIIb: Stable. -Check CMP in 1 week   Hypothyroidism -Continue levothyroxine   History of breast cancer -Continue tamoxifen   History of gout -Continue allopurinol   Cirrhosis, diagnosed 2022: Suspected Nash cirrhosis. -continued on lactulose and xifaxan -Check CMP in 1 week.  Language barrier: Patient speaks [place].  iPad interpreter with ID number [id number] utilized.    Time spent 35 minutes  Vital signs Vitals:   05/23/23 1324 05/23/23 2012 05/24/23 0414 05/24/23 0722  BP: 129/67 133/61 (!) 113/59 (!) 140/65  Pulse: 64 60 60 65  Temp: 98.7 F (37.1 C) 98 F (36.7 C) 98.3 F (36.8 C) 98.1 F (36.7 C)  Resp: 17 18 17 18   Height:      Weight:      SpO2: 100% 98% 98% 100%  TempSrc: Oral Oral Oral Oral  BMI (Calculated):         Discharge exam  GENERAL: No apparent distress.  Nontoxic. HEENT: MMM.  Vision and hearing grossly intact.  NECK: Supple.  No apparent JVD.  RESP:  No IWOB.  Fair aeration bilaterally. CVS:  RRR. Heart sounds normal.  ABD/GI/GU: BS+. Abd soft, NTND.  MSK/EXT:  Moves extremities.  Left knee in knee immobilizer.  Left arm in a sling.  Neurovascular intact. SKIN: no apparent skin lesion or wound NEURO: Awake and alert. Oriented appropriately.  No apparent focal neuro deficit. PSYCH: Calm. Normal affect.   Discharge Instructions Discharge Instructions     Diet - low sodium heart healthy   Complete by: As directed    Discharge wound care:   Complete by: As directed    Wound care  Daily      Comments: Foam dressing to sacrum/gluteal fold, bilat posterior upper thighs.  Change foam dressings Q 3 days or PRN soiling.   Increase activity slowly   Complete by: As directed       Allergies as of 05/24/2023       Reactions   Chlorhexidine Gluconate  Hives   Penicillins Itching   Tolerated Cephalosporin Date: 03/17/21. Td Ancef 2g 07/26/21        Medication List     STOP taking these medications    traMADol 50 MG tablet Commonly known as: ULTRAM       TAKE these medications    allopurinol 100 MG tablet Commonly known as: ZYLOPRIM Take 0.5 tablets (50 mg total) by mouth daily.   apixaban 5 MG Tabs tablet Commonly known as: ELIQUIS Take 1 tablet (5 mg total) by mouth 2 (two) times daily.   Blue-Emu Maximum Pain Relief 10 % cream Generic drug: trolamine salicylate Apply 1 Application topically as needed for muscle pain.   carvedilol 12.5 MG tablet Commonly known as: COREG Take 12.5 mg by mouth 2 (two) times daily with a meal.   clotrimazole-betamethasone cream Commonly known as: LOTRISONE Apply 1 Application topically 2 (two) times daily.   diclofenac Sodium 1 % Gel Commonly known as: VOLTAREN APPLY FOUR GRAMS FOUR TIMES DAILY AS NEEDED   DUODERM CGF BORDER DRESSING EX Apply 1 Application topically daily.   HYDROcodone-acetaminophen 5-325  MG tablet Commonly known as: NORCO/VICODIN Take 1-2 tablets by mouth every 4 (four) hours as needed for up to 5 days for moderate pain or severe pain.   lactulose 10 GM/15ML solution Commonly known as: CHRONULAC TAKE 15 MILLILITERS BY MOUTH THREE TIMESDAILY What changed: See the new instructions.   levothyroxine 50 MCG tablet Commonly known as: SYNTHROID Take 1 tablet (50 mcg total) by mouth daily before breakfast. Overdue for Annual appt must see provider for future refills   lipase/protease/amylase 12000-38000 units Cpep capsule Commonly known as: CREON Take 1 capsule (12,000 Units total) by mouth daily as needed (stomach problems).   lovastatin 20 MG tablet Commonly known as: MEVACOR Take 20 mg by mouth at bedtime.   magnesium oxide 400 MG tablet Commonly known as: MAG-OX Take 1 tablet (400 mg total) by mouth 2 (two) times daily.   omeprazole 40 MG  capsule Commonly known as: PRILOSEC TAKE ONE CAPSULE BY MOUTH DAILY   potassium chloride 8 MEQ tablet Commonly known as: KLOR-CON TAKE ONE (1) TABLET BY MOUTH EVERY DAY   rifaximin 550 MG Tabs tablet Commonly known as: XIFAXAN Take 1 tablet (550 mg total) by mouth 2 (two) times daily.   torsemide 100 MG tablet Commonly known as: DEMADEX Take 1 tablet (100 mg total) by mouth 2 (two) times daily. What changed:  how much to take when to take this additional instructions   triamcinolone cream 0.1 % Commonly known as: KENALOG Apply topically 2 (two) times daily.               Discharge Care Instructions  (From admission, onward)           Start     Ordered   05/24/23 0000  Discharge wound care:       Comments: Wound care  Daily      Comments: Foam dressing to sacrum/gluteal fold, bilat posterior upper thighs.  Change foam dressings Q 3 days or PRN soiling.   05/24/23 0737            Consultations: Orthopedic surgery  Procedures/Studies:   DG Shoulder Left  Result Date: 05/23/2023 CLINICAL DATA:  Worsening left shoulder pain today. Known clavicular fracture. EXAM: LEFT SHOULDER - 2+ VIEW COMPARISON:  Radiographs 05/17/2023.  No other comparison studies. FINDINGS: The bones are diffusely demineralized. A definite fracture of the distal left clavicle is not seen on the current study. There are moderate acromioclavicular and moderate to advanced glenohumeral degenerative changes. The humeral head is high-riding with obliteration of the subacromial space. The glenoid is not well defined on this study although appears fragmented. There is no evidence of glenohumeral dislocation. Left subclavian pacemaker noted. IMPRESSION: 1. Moderate to advanced glenohumeral degenerative changes with possible fragmentation of the glenoid. This could be secondary to a subacute fracture. 2. No definite acute fracture of the distal clavicle seen on the current study. 3. High-riding  humeral head suggesting chronic rotator cuff tear. 4. Consider further evaluation with CT. Electronically Signed   By: Carey Bullocks M.D.   On: 05/23/2023 17:21   CT Knee Left Wo Contrast  Result Date: 05/18/2023 CLINICAL DATA:  Fracture, knee patella and ?fem condyle fracture EXAM: CT OF THE LEFT KNEE WITHOUT CONTRAST TECHNIQUE: Multidetector CT imaging of the left knee was performed according to the standard protocol. Multiplanar CT image reconstructions were also generated. RADIATION DOSE REDUCTION: This exam was performed according to the departmental dose-optimization program which includes automated exposure control, adjustment of the mA and/or kV according  to patient size and/or use of iterative reconstruction technique. COMPARISON:  05/17/2023 FINDINGS: Bones/Joint/Cartilage There is a fracture through the lower pole of the left patella. Mildly displaced fracture fragments. Advanced tricompartment osteoarthritis with near complete joint space loss most pronounced in the medial compartment. Extensive osteophyte formation. No medial femoral condyle fracture. Moderate joint effusion. Ligaments Suboptimally assessed by CT. Muscles and Tendons Unremarkable Soft tissues Edema throughout the subcutaneous soft tissues IMPRESSION: Displaced fracture through the inferior pole of the left patella. No fracture through the medial femoral condyle. Advanced tricompartment degenerative changes. Moderate joint effusion. Electronically Signed   By: Charlett Nose M.D.   On: 05/18/2023 00:18   DG Hip Unilat W or Wo Pelvis 2-3 Views Left  Result Date: 05/17/2023 CLINICAL DATA:  Fall.  Left pain EXAM: DG HIP (WITH OR WITHOUT PELVIS) 2-3V LEFT COMPARISON:  None Available. FINDINGS: There is no evidence of hip fracture or dislocation. Hip joint space narrowing with marginal osteophytes suggesting mild-to-moderate osteoarthritis. IMPRESSION: 1. No fracture or dislocation. 2. Mild-to-moderate left hip osteoarthritis.  Electronically Signed   By: Larose Hires D.O.   On: 05/17/2023 22:24   DG Knee Complete 4 Views Left  Result Date: 05/17/2023 CLINICAL DATA:  Level 2 fall on thinners. EXAM: LEFT KNEE - COMPLETE 4+ VIEW COMPARISON:  None Available. FINDINGS: There is comminuted fracture of the patella. There is also cortical irregularity of the medial femoral condyle concerning for an acute fracture. Evaluation is limited due to positioning and advanced osteoarthritis of the knee. Marked soft tissue swelling about the knee joint. IMPRESSION: 1. Comminuted fracture of the patella. 2. Cortical irregularity of the medial femoral condyle concerning for an acute fracture. Cross-sectional imaging for further evaluation is suggested. 3. Advanced tricompartmental knee osteoarthritis. Electronically Signed   By: Larose Hires D.O.   On: 05/17/2023 22:23   DG Ribs Unilateral W/Chest Left  Result Date: 05/17/2023 CLINICAL DATA:  Fall EXAM: LEFT RIBS AND CHEST - 3+ VIEW COMPARISON:  10/12/2021 FINDINGS: Left pacer remains in place, unchanged. Cardiomegaly, vascular congestion. Diffuse interstitial prominence, likely interstitial edema. No effusions or acute bony abnormality. No visible rib fracture. Previously seen distal left clavicle fracture on shoulder series not visualized on this study. IMPRESSION: Cardiomegaly, vascular congestion.  Suspect mild interstitial edema. No visible left rib fracture. Electronically Signed   By: Charlett Nose M.D.   On: 05/17/2023 22:21   DG Shoulder Left Port  Result Date: 05/17/2023 CLINICAL DATA:  Fall EXAM: LEFT SHOULDER COMPARISON:  None Available. FINDINGS: Lucency in the distal left clavicle concerning for distal clavicle fracture. AC and glenohumeral joint osteoarthritis with joint space narrowing and spurring. No subluxation or dislocation. Pacer partially visualized. IMPRESSION: Distal left clavicle fracture. Electronically Signed   By: Charlett Nose M.D.   On: 05/17/2023 22:19   CT  CERVICAL SPINE WO CONTRAST  Result Date: 05/17/2023 CLINICAL DATA:  Polytrauma, blunt EXAM: CT CERVICAL SPINE WITHOUT CONTRAST TECHNIQUE: Multidetector CT imaging of the cervical spine was performed without intravenous contrast. Multiplanar CT image reconstructions were also generated. RADIATION DOSE REDUCTION: This exam was performed according to the departmental dose-optimization program which includes automated exposure control, adjustment of the mA and/or kV according to patient size and/or use of iterative reconstruction technique. COMPARISON:  None Available. FINDINGS: Alignment: Slight anterolisthesis of C2 on C3 related to facet disease. Skull base and vertebrae: No acute fracture. No primary bone lesion or focal pathologic process. Soft tissues and spinal canal: No prevertebral fluid or swelling. No visible canal hematoma.  Disc levels: Diffuse degenerative disc disease with disc space narrowing and spurring. Diffuse degenerative facet disease bilaterally. Upper chest: No acute findings Other: None IMPRESSION: Cervical spondylosis.  No acute bony abnormality. Electronically Signed   By: Charlett Nose M.D.   On: 05/17/2023 21:42   CT HEAD WO CONTRAST  Result Date: 05/17/2023 CLINICAL DATA:  Head trauma EXAM: CT HEAD WITHOUT CONTRAST TECHNIQUE: Contiguous axial images were obtained from the base of the skull through the vertex without intravenous contrast. RADIATION DOSE REDUCTION: This exam was performed according to the departmental dose-optimization program which includes automated exposure control, adjustment of the mA and/or kV according to patient size and/or use of iterative reconstruction technique. COMPARISON:  Head CT 07/01/2021 FINDINGS: Brain: No evidence of acute infarction, hemorrhage, hydrocephalus, extra-axial collection or mass lesion/mass effect. There is mild periventricular white matter hypodensity, likely chronic small vessel ischemic change. Vascular: Atherosclerotic calcifications  are present within the cavernous internal carotid arteries. Skull: Normal. Negative for fracture or focal lesion. Sinuses/Orbits: No acute finding. Other: None. IMPRESSION: 1. No acute intracranial process. 2. Mild chronic small vessel ischemic change. Electronically Signed   By: Darliss Cheney M.D.   On: 05/17/2023 21:39       The results of significant diagnostics from this hospitalization (including imaging, microbiology, ancillary and laboratory) are listed below for reference.     Microbiology: Recent Results (from the past 240 hour(s))  Urine Culture (for pregnant, neutropenic or urologic patients or patients with an indwelling urinary catheter)     Status: Abnormal   Collection Time: 05/18/23  1:37 PM   Specimen: Urine, Clean Catch  Result Value Ref Range Status   Specimen Description URINE, CLEAN CATCH  Final   Special Requests NONE  Final   Culture (A)  Final    <10,000 COLONIES/mL INSIGNIFICANT GROWTH Performed at [facility], [address], [place], [place] [address]    Report Status 05/19/2023 FINAL  Final     Labs:  CBC: Recent Labs  Lab 05/19/23 0807 05/21/23 0124 05/22/23 0446 05/23/23 0131 05/24/23 0031  WBC 3.8* 5.7 4.9 5.0 5.2  HGB 10.1* 10.8* 11.3* 11.5* 12.4  HCT 33.6* 35.3* 36.1 37.6 40.4  MCV 97.1 96.2 95.3 93.8 96.0  PLT 132* 139* 142* 144* 160   BMP &GFR Recent Labs  Lab 05/20/23 0326 05/21/23 0124 05/22/23 0446 05/23/23 0131 05/24/23 0031  NA 140 138 137 138 140  K 3.4* 3.5 3.5 3.2* 3.8  CL 109 103 100 98 102  CO2 24 26 30 29 29   GLUCOSE 121* 105* 90 96 104*  BUN 19 18 22  24* 23  CREATININE 1.30* 1.49* 1.47* 1.44* 1.51*  CALCIUM 9.4 9.2 8.9 9.3 9.7   Estimated Creatinine Clearance: 32.3 mL/min (A) (by C-G formula based on SCr of 1.51 mg/dL (H)). Liver & Pancreas: Recent Labs  Lab 05/20/23 0326 05/21/23 0124 05/22/23 0446 05/23/23 0131 05/24/23 0031  AST 17 15 16 18 23   ALT 14 16 14 14 14   ALKPHOS 121 121 108 126 132*   BILITOT 1.0 1.0 1.1 1.1 1.0  PROT 5.8* 5.8* 6.0* 6.6 6.9  ALBUMIN 2.6* 2.8* 2.8* 3.1* 3.3*   No results for input(s): "LIPASE", "AMYLASE" in the last 168 hours. No results for input(s): "AMMONIA" in the last 168 hours. Diabetic: No results for input(s): "HGBA1C" in the last 72 hours. No results for input(s): "GLUCAP" in the last 168 hours. Cardiac Enzymes: No results for input(s): "CKTOTAL", "CKMB", "CKMBINDEX", "TROPONINI" in the last 168  hours. No results for input(s): "PROBNP" in the last 8760 hours. Coagulation Profile: Recent Labs  Lab 05/17/23 2107  INR 1.4*   Thyroid Function Tests: No results for input(s): "TSH", "T4TOTAL", "FREET4", "T3FREE", "THYROIDAB" in the last 72 hours. Lipid Profile: No results for input(s): "CHOL", "HDL", "LDLCALC", "TRIG", "CHOLHDL", "LDLDIRECT" in the last 72 hours. Anemia Panel: No results for input(s): "VITAMINB12", "FOLATE", "FERRITIN", "TIBC", "IRON", "RETICCTPCT" in the last 72 hours. Urine analysis:    Component Value Date/Time   COLORURINE AMBER (A) 05/18/2023 0325   APPEARANCEUR CLOUDY (A) 05/18/2023 0325   LABSPEC 1.015 05/18/2023 0325   PHURINE 5.0 05/18/2023 0325   GLUCOSEU NEGATIVE 05/18/2023 0325   GLUCOSEU NEGATIVE 07/11/2017 1108   HGBUR MODERATE (A) 05/18/2023 0325   BILIRUBINUR NEGATIVE 05/18/2023 0325   KETONESUR NEGATIVE 05/18/2023 0325   PROTEINUR 100 (A) 05/18/2023 0325   UROBILINOGEN 0.2 07/11/2017 1108   NITRITE NEGATIVE 05/18/2023 0325   LEUKOCYTESUR LARGE (A) 05/18/2023 0325   Sepsis Labs: Invalid input(s): "PROCALCITONIN", "LACTICIDVEN"   SIGNED:  Almon Hercules, MD  [facility] 05/24/2023, 12:19 PM

## 2023-05-24 NOTE — Progress Notes (Signed)
Physical Therapy Treatment Patient Details Name: Katelyn Jackson MRN: [id number] DOB: 09-Feb-1946 Today's Date: 05/24/2023   History of Present Illness 77 y.o. female who presents after a fall at home. CT and Xray show a L distal clavicle fx and a L patella fx. Past medical history significant of hypertension, hyperlipidemia, chronic atrial fibrillation, s/p permanent pacemaker, hypothyroidism, CKD stage 3, lymphedema and history of multidrug-resistant bacteria    PT Comments  Pt tolerates treatment well, progressing to out of bed transfers with PT assistance. Pt is able to mobilize at bed level with less physical assistance at this time. Pt continues significant assistance to transfer at this time as she is dependent on R side for power and balance due to L sided WB restrictions. Pt remains at a high risk for falls and will require extended recovery time due to WB restrictions. PT continues to recommend short term inpatient PT services at the time of discharge.     Assistance Recommended at Discharge Frequent or constant Supervision/Assistance  If plan is discharge home, recommend the following:  Can travel by private vehicle    Two people to help with walking and/or transfers;Two people to help with bathing/dressing/bathroom;Assistance with cooking/housework;Direct supervision/assist for medications management;Assistance with feeding;Direct supervision/assist for financial management;Assist for transportation;Help with stairs or ramp for entrance   No  Equipment Recommendations  Hospital bed;Other (comment) (hoyer lift)    Recommendations for Other Services       Precautions / Restrictions Precautions Precautions: Fall Required Braces or Orthoses: Knee Immobilizer - Left;Sling Knee Immobilizer - Left: On at all times Restrictions Weight Bearing Restrictions: Yes RUE Weight Bearing: Non weight bearing LUE Weight Bearing: Non weight bearing LLE Weight Bearing: Non weight bearing      Mobility  Bed Mobility Overal bed mobility: Needs Assistance Bed Mobility: Rolling, Sidelying to Sit Rolling: Min assist Sidelying to sit: Mod assist       General bed mobility comments: use of railing, verbal cues for technique. hand hold to pull trunk into sitting    Transfers Overall transfer level: Needs assistance Equipment used: 1 person hand held assist Transfers: Bed to chair/wheelchair/BSC, Sit to/from Stand Sit to Stand: Max assist     Squat pivot transfers: Max assist     General transfer comment: PT provides RUE support and R knee block, facilitates pivot at trunk with gait belt    Ambulation/Gait                   Stairs             Wheelchair Mobility     Tilt Bed    Modified Rankin (Stroke Patients Only)       Balance Overall balance assessment: Needs assistance Sitting-balance support: Single extremity supported, Feet supported Sitting balance-Leahy Scale: Poor     Standing balance support: Single extremity supported Standing balance-Leahy Scale: Poor Standing balance comment: maxA in SLS on RLE, assist to weight shift to R at trunk                            Cognition Arousal/Alertness: Awake/alert Behavior During Therapy: WFL for tasks assessed/performed Overall Cognitive Status: Within Functional Limits for tasks assessed                                          Exercises  General Comments General comments (skin integrity, edema, etc.): VSS on RA      Pertinent Vitals/Pain Pain Assessment Pain Assessment: Faces Faces Pain Scale: Hurts even more Pain Location: L knee Pain Descriptors / Indicators: Grimacing Pain Intervention(s): Monitored during session    Home Living                          Prior Function            PT Goals (current goals can now be found in the care plan section) Acute Rehab PT Goals Patient Stated Goal: to reduce LLE pain Progress  towards PT goals: Progressing toward goals    Frequency    Min 3X/week      PT Plan Current plan remains appropriate    Co-evaluation              AM-PAC PT "6 Clicks" Mobility   Outcome Measure  Help needed turning from your back to your side while in a flat bed without using bedrails?: A Little Help needed moving from lying on your back to sitting on the side of a flat bed without using bedrails?: A Lot Help needed moving to and from a bed to a chair (including a wheelchair)?: A Lot Help needed standing up from a chair using your arms (e.g., wheelchair or bedside chair)?: A Lot Help needed to walk in hospital room?: Total Help needed climbing 3-5 steps with a railing? : Total 6 Click Score: 11    End of Session Equipment Utilized During Treatment: Gait belt Activity Tolerance: Patient tolerated treatment well Patient left: in chair;with call bell/phone within reach;with chair alarm set Nurse Communication: Mobility status;Need for lift equipment PT Visit Diagnosis: Unsteadiness on feet (R26.81);Muscle weakness (generalized) (M62.81);History of falling (Z91.81);Pain Pain - Right/Left: Left Pain - part of body: Shoulder;Knee     Time: 1030-1058 PT Time Calculation (min) (ACUTE ONLY): 28 min  Charges:    $Therapeutic Activity: 23-37 mins PT General Charges $$ ACUTE PT VISIT: 1 Visit                     Arlyss Gandy, PT, DPT Acute Rehabilitation Office [phone]    Arlyss Gandy 05/24/2023, 11:53 AM

## 2023-05-24 NOTE — TOC Transition Note (Signed)
Transition of Care [facility]) - CM/SW Discharge Note   Patient Details  Name: Katelyn Jackson MRN: [id number] Date of Birth: 01/10/1946  [facility]) CM/SW Contact:  Lorri Frederick, LCSW Phone Number: 05/24/2023, 12:38 PM   Clinical Narrative:   Pt discharging to [name], room [address] [place].  RN call [phone] for report.    1000: CSW confirmed with [facility] that they can receive pt today.   Final next level of care: Skilled Nursing Facility Barriers to Discharge: Barriers Resolved   Patient Goals and CMS Choice CMS Medicare.gov Compare Post Acute Care list provided to:: Patient Represenative (must comment) Choice offered to / list presented to : Adult Children  Discharge Placement                Patient [facility] Patient to be transferred to facility by: [facility] Name of family member notified: left message for son [name] Patient and family notified of of transfer: 05/24/23  Discharge Plan and Services Additional resources added to the After Visit Summary for                                       Social Determinants of Health (SDOH) Interventions SDOH Screenings   Food Insecurity: No Food Insecurity (05/18/2023)  Housing: Patient Unable To Answer (05/18/2023)  Transportation Needs: No Transportation Needs (05/18/2023)  Utilities: Not At Risk (05/18/2023)  Alcohol Screen: Low Risk  (06/15/2022)  Depression (PHQ2-9): Low Risk  (04/17/2023)  Financial Resource Strain: Low Risk  (06/15/2022)  Physical Activity: Inactive (06/15/2022)  Social Connections: Socially Isolated (06/15/2022)  Stress: No Stress Concern Present (06/15/2022)  Tobacco Use: Low Risk  (05/18/2023)     Readmission Risk Interventions    07/06/2021   11:49 AM 03/19/2021    3:03 PM  Readmission Risk Prevention Plan  Transportation Screening Complete Complete  HRI or Home Care Consult  Complete  Social Work Consult for Recovery Care  Planning/Counseling  Complete  Palliative Care Screening  Not Applicable  Medication Review [facility])  Complete  HRI or Home Care Consult Complete   SW Recovery Care/Counseling Consult Complete   Palliative Care Screening Not Applicable   Skilled Nursing Facility Complete

## 2023-05-26 DIAGNOSIS — S82002A Unspecified fracture of left patella, initial encounter for closed fracture: Secondary | ICD-10-CM | POA: Diagnosis not present

## 2023-05-26 DIAGNOSIS — E785 Hyperlipidemia, unspecified: Secondary | ICD-10-CM | POA: Diagnosis not present

## 2023-05-26 DIAGNOSIS — I10 Essential (primary) hypertension: Secondary | ICD-10-CM | POA: Diagnosis not present

## 2023-05-26 DIAGNOSIS — I890 Lymphedema, not elsewhere classified: Secondary | ICD-10-CM | POA: Diagnosis not present

## 2023-05-26 DIAGNOSIS — I4891 Unspecified atrial fibrillation: Secondary | ICD-10-CM | POA: Diagnosis not present

## 2023-05-26 DIAGNOSIS — E039 Hypothyroidism, unspecified: Secondary | ICD-10-CM | POA: Diagnosis not present

## 2023-05-26 DIAGNOSIS — K219 Gastro-esophageal reflux disease without esophagitis: Secondary | ICD-10-CM | POA: Diagnosis not present

## 2023-05-26 DIAGNOSIS — S42033A Displaced fracture of lateral end of unspecified clavicle, initial encounter for closed fracture: Secondary | ICD-10-CM | POA: Diagnosis not present

## 2023-05-26 DIAGNOSIS — M109 Gout, unspecified: Secondary | ICD-10-CM | POA: Diagnosis not present

## 2023-05-29 ENCOUNTER — Ambulatory Visit: Payer: Medicare HMO | Admitting: Internal Medicine

## 2023-05-29 DIAGNOSIS — K7460 Unspecified cirrhosis of liver: Secondary | ICD-10-CM | POA: Diagnosis not present

## 2023-05-29 DIAGNOSIS — D649 Anemia, unspecified: Secondary | ICD-10-CM | POA: Diagnosis not present

## 2023-05-29 DIAGNOSIS — I5032 Chronic diastolic (congestive) heart failure: Secondary | ICD-10-CM | POA: Diagnosis not present

## 2023-05-29 DIAGNOSIS — L89892 Pressure ulcer of other site, stage 2: Secondary | ICD-10-CM | POA: Diagnosis not present

## 2023-05-30 ENCOUNTER — Ambulatory Visit: Payer: Medicare HMO | Admitting: Internal Medicine

## 2023-05-30 DIAGNOSIS — R52 Pain, unspecified: Secondary | ICD-10-CM | POA: Insufficient documentation

## 2023-05-31 DIAGNOSIS — N1832 Chronic kidney disease, stage 3b: Secondary | ICD-10-CM | POA: Diagnosis not present

## 2023-06-01 DIAGNOSIS — L89153 Pressure ulcer of sacral region, stage 3: Secondary | ICD-10-CM | POA: Insufficient documentation

## 2023-06-05 DIAGNOSIS — D649 Anemia, unspecified: Secondary | ICD-10-CM | POA: Diagnosis not present

## 2023-06-05 DIAGNOSIS — I5032 Chronic diastolic (congestive) heart failure: Secondary | ICD-10-CM | POA: Diagnosis not present

## 2023-06-05 DIAGNOSIS — L89892 Pressure ulcer of other site, stage 2: Secondary | ICD-10-CM | POA: Diagnosis not present

## 2023-06-05 DIAGNOSIS — K7460 Unspecified cirrhosis of liver: Secondary | ICD-10-CM | POA: Diagnosis not present

## 2023-06-07 DIAGNOSIS — R339 Retention of urine, unspecified: Secondary | ICD-10-CM | POA: Insufficient documentation

## 2023-06-08 ENCOUNTER — Ambulatory Visit: Payer: Medicare HMO | Admitting: Podiatry

## 2023-06-08 DIAGNOSIS — K567 Ileus, unspecified: Secondary | ICD-10-CM | POA: Diagnosis not present

## 2023-06-08 DIAGNOSIS — N390 Urinary tract infection, site not specified: Secondary | ICD-10-CM | POA: Diagnosis not present

## 2023-06-08 DIAGNOSIS — K5900 Constipation, unspecified: Secondary | ICD-10-CM | POA: Diagnosis not present

## 2023-06-09 ENCOUNTER — Inpatient Hospital Stay (HOSPITAL_COMMUNITY)
Admission: EM | Admit: 2023-06-09 | Discharge: 2023-06-15 | DRG: 640 | Disposition: A | Discharge status: Skilled Nursing Facility | Payer: Medicare HMO | Attending: Internal Medicine | Admitting: Internal Medicine

## 2023-06-09 ENCOUNTER — Other Ambulatory Visit: Payer: Self-pay

## 2023-06-09 ENCOUNTER — Encounter (HOSPITAL_COMMUNITY): Payer: Self-pay

## 2023-06-09 ENCOUNTER — Emergency Department (HOSPITAL_COMMUNITY): Payer: Medicare HMO

## 2023-06-09 DIAGNOSIS — K5641 Fecal impaction: Secondary | ICD-10-CM | POA: Diagnosis present

## 2023-06-09 DIAGNOSIS — I272 Pulmonary hypertension, unspecified: Secondary | ICD-10-CM | POA: Diagnosis not present

## 2023-06-09 DIAGNOSIS — S42002D Fracture of unspecified part of left clavicle, subsequent encounter for fracture with routine healing: Secondary | ICD-10-CM | POA: Diagnosis not present

## 2023-06-09 DIAGNOSIS — N858 Other specified noninflammatory disorders of uterus: Secondary | ICD-10-CM | POA: Diagnosis not present

## 2023-06-09 DIAGNOSIS — I129 Hypertensive chronic kidney disease with stage 1 through stage 4 chronic kidney disease, or unspecified chronic kidney disease: Secondary | ICD-10-CM | POA: Diagnosis present

## 2023-06-09 DIAGNOSIS — S82002D Unspecified fracture of left patella, subsequent encounter for closed fracture with routine healing: Secondary | ICD-10-CM | POA: Diagnosis not present

## 2023-06-09 DIAGNOSIS — Z978 Presence of other specified devices: Secondary | ICD-10-CM | POA: Diagnosis present

## 2023-06-09 DIAGNOSIS — R1084 Generalized abdominal pain: Secondary | ICD-10-CM | POA: Diagnosis not present

## 2023-06-09 DIAGNOSIS — I959 Hypotension, unspecified: Secondary | ICD-10-CM | POA: Diagnosis not present

## 2023-06-09 DIAGNOSIS — Z7401 Bed confinement status: Secondary | ICD-10-CM | POA: Diagnosis not present

## 2023-06-09 DIAGNOSIS — Z91048 Other nonmedicinal substance allergy status: Secondary | ICD-10-CM

## 2023-06-09 DIAGNOSIS — L89893 Pressure ulcer of other site, stage 3: Secondary | ICD-10-CM | POA: Diagnosis present

## 2023-06-09 DIAGNOSIS — Z825 Family history of asthma and other chronic lower respiratory diseases: Secondary | ICD-10-CM

## 2023-06-09 DIAGNOSIS — E039 Hypothyroidism, unspecified: Secondary | ICD-10-CM | POA: Diagnosis not present

## 2023-06-09 DIAGNOSIS — K5289 Other specified noninfective gastroenteritis and colitis: Secondary | ICD-10-CM | POA: Diagnosis present

## 2023-06-09 DIAGNOSIS — R935 Abnormal findings on diagnostic imaging of other abdominal regions, including retroperitoneum: Secondary | ICD-10-CM | POA: Diagnosis not present

## 2023-06-09 DIAGNOSIS — H1045 Other chronic allergic conjunctivitis: Secondary | ICD-10-CM | POA: Insufficient documentation

## 2023-06-09 DIAGNOSIS — Z950 Presence of cardiac pacemaker: Secondary | ICD-10-CM | POA: Diagnosis not present

## 2023-06-09 DIAGNOSIS — I4821 Permanent atrial fibrillation: Secondary | ICD-10-CM | POA: Diagnosis present

## 2023-06-09 DIAGNOSIS — M1A9XX0 Chronic gout, unspecified, without tophus (tophi): Secondary | ICD-10-CM | POA: Diagnosis present

## 2023-06-09 DIAGNOSIS — I482 Chronic atrial fibrillation, unspecified: Secondary | ICD-10-CM | POA: Diagnosis present

## 2023-06-09 DIAGNOSIS — K7460 Unspecified cirrhosis of liver: Secondary | ICD-10-CM | POA: Diagnosis not present

## 2023-06-09 DIAGNOSIS — R1032 Left lower quadrant pain: Secondary | ICD-10-CM | POA: Diagnosis not present

## 2023-06-09 DIAGNOSIS — I493 Ventricular premature depolarization: Secondary | ICD-10-CM

## 2023-06-09 DIAGNOSIS — R109 Unspecified abdominal pain: Secondary | ICD-10-CM

## 2023-06-09 DIAGNOSIS — S82012D Displaced osteochondral fracture of left patella, subsequent encounter for closed fracture with routine healing: Secondary | ICD-10-CM

## 2023-06-09 DIAGNOSIS — W19XXXD Unspecified fall, subsequent encounter: Secondary | ICD-10-CM | POA: Diagnosis present

## 2023-06-09 DIAGNOSIS — N1832 Chronic kidney disease, stage 3b: Secondary | ICD-10-CM | POA: Diagnosis present

## 2023-06-09 DIAGNOSIS — I5032 Chronic diastolic (congestive) heart failure: Secondary | ICD-10-CM | POA: Diagnosis not present

## 2023-06-09 DIAGNOSIS — K5900 Constipation, unspecified: Secondary | ICD-10-CM | POA: Diagnosis present

## 2023-06-09 DIAGNOSIS — S0990XD Unspecified injury of head, subsequent encounter: Secondary | ICD-10-CM | POA: Diagnosis not present

## 2023-06-09 DIAGNOSIS — R9389 Abnormal findings on diagnostic imaging of other specified body structures: Secondary | ICD-10-CM

## 2023-06-09 DIAGNOSIS — E669 Obesity, unspecified: Secondary | ICD-10-CM | POA: Diagnosis present

## 2023-06-09 DIAGNOSIS — Z7901 Long term (current) use of anticoagulants: Secondary | ICD-10-CM

## 2023-06-09 DIAGNOSIS — Z6838 Body mass index (BMI) 38.0-38.9, adult: Secondary | ICD-10-CM | POA: Diagnosis not present

## 2023-06-09 DIAGNOSIS — E876 Hypokalemia: Principal | ICD-10-CM | POA: Diagnosis present

## 2023-06-09 DIAGNOSIS — Z823 Family history of stroke: Secondary | ICD-10-CM

## 2023-06-09 DIAGNOSIS — Z9181 History of falling: Secondary | ICD-10-CM

## 2023-06-09 DIAGNOSIS — R9431 Abnormal electrocardiogram [ECG] [EKG]: Secondary | ICD-10-CM | POA: Diagnosis not present

## 2023-06-09 DIAGNOSIS — Z923 Personal history of irradiation: Secondary | ICD-10-CM

## 2023-06-09 DIAGNOSIS — Z853 Personal history of malignant neoplasm of breast: Secondary | ICD-10-CM

## 2023-06-09 DIAGNOSIS — Z8249 Family history of ischemic heart disease and other diseases of the circulatory system: Secondary | ICD-10-CM

## 2023-06-09 DIAGNOSIS — K828 Other specified diseases of gallbladder: Secondary | ICD-10-CM | POA: Diagnosis not present

## 2023-06-09 DIAGNOSIS — L89152 Pressure ulcer of sacral region, stage 2: Secondary | ICD-10-CM | POA: Diagnosis present

## 2023-06-09 DIAGNOSIS — N200 Calculus of kidney: Secondary | ICD-10-CM | POA: Diagnosis not present

## 2023-06-09 DIAGNOSIS — Z79899 Other long term (current) drug therapy: Secondary | ICD-10-CM

## 2023-06-09 DIAGNOSIS — Z880 Allergy status to penicillin: Secondary | ICD-10-CM

## 2023-06-09 DIAGNOSIS — R110 Nausea: Secondary | ICD-10-CM | POA: Diagnosis not present

## 2023-06-09 DIAGNOSIS — D259 Leiomyoma of uterus, unspecified: Secondary | ICD-10-CM | POA: Diagnosis not present

## 2023-06-09 DIAGNOSIS — Z79890 Hormone replacement therapy (postmenopausal): Secondary | ICD-10-CM | POA: Diagnosis not present

## 2023-06-09 DIAGNOSIS — N139 Obstructive and reflux uropathy, unspecified: Secondary | ICD-10-CM | POA: Diagnosis present

## 2023-06-09 DIAGNOSIS — S42009A Fracture of unspecified part of unspecified clavicle, initial encounter for closed fracture: Secondary | ICD-10-CM | POA: Diagnosis present

## 2023-06-09 DIAGNOSIS — I890 Lymphedema, not elsewhere classified: Secondary | ICD-10-CM | POA: Diagnosis present

## 2023-06-09 DIAGNOSIS — E66812 Obesity, class 2: Secondary | ICD-10-CM | POA: Insufficient documentation

## 2023-06-09 DIAGNOSIS — M109 Gout, unspecified: Secondary | ICD-10-CM | POA: Diagnosis not present

## 2023-06-09 DIAGNOSIS — T501X5A Adverse effect of loop [high-ceiling] diuretics, initial encounter: Secondary | ICD-10-CM | POA: Diagnosis present

## 2023-06-09 DIAGNOSIS — E860 Dehydration: Secondary | ICD-10-CM | POA: Diagnosis present

## 2023-06-09 DIAGNOSIS — K219 Gastro-esophageal reflux disease without esophagitis: Secondary | ICD-10-CM | POA: Diagnosis present

## 2023-06-09 DIAGNOSIS — E785 Hyperlipidemia, unspecified: Secondary | ICD-10-CM | POA: Diagnosis present

## 2023-06-09 DIAGNOSIS — Z4789 Encounter for other orthopedic aftercare: Secondary | ICD-10-CM | POA: Diagnosis not present

## 2023-06-09 DIAGNOSIS — S82092A Other fracture of left patella, initial encounter for closed fracture: Secondary | ICD-10-CM | POA: Diagnosis present

## 2023-06-09 DIAGNOSIS — D252 Subserosal leiomyoma of uterus: Secondary | ICD-10-CM | POA: Diagnosis not present

## 2023-06-09 DIAGNOSIS — S42002A Fracture of unspecified part of left clavicle, initial encounter for closed fracture: Secondary | ICD-10-CM | POA: Diagnosis not present

## 2023-06-09 DIAGNOSIS — K529 Noninfective gastroenteritis and colitis, unspecified: Secondary | ICD-10-CM | POA: Diagnosis not present

## 2023-06-09 DIAGNOSIS — G8911 Acute pain due to trauma: Secondary | ICD-10-CM | POA: Diagnosis not present

## 2023-06-09 DIAGNOSIS — K449 Diaphragmatic hernia without obstruction or gangrene: Secondary | ICD-10-CM | POA: Diagnosis not present

## 2023-06-09 DIAGNOSIS — Z603 Acculturation difficulty: Secondary | ICD-10-CM | POA: Diagnosis present

## 2023-06-09 LAB — URINALYSIS, ROUTINE W REFLEX MICROSCOPIC
Bilirubin Urine: NEGATIVE
Glucose, UA: NEGATIVE mg/dL
Ketones, ur: NEGATIVE mg/dL
Nitrite: POSITIVE — AB
Protein, ur: 30 mg/dL — AB
Specific Gravity, Urine: 1.017 (ref 1.005–1.030)
WBC, UA: >50 WBC/hpf (ref 0–5)
pH: 6 (ref 5.0–8.0)

## 2023-06-09 LAB — CBC WITH DIFFERENTIAL/PLATELET
Abs Immature Granulocytes: 0.02 10*3/uL (ref 0.00–0.07)
Basophils Absolute: 0 10*3/uL (ref 0.0–0.1)
Basophils Relative: 0 %
Eosinophils Absolute: 0 10*3/uL (ref 0.0–0.5)
Eosinophils Relative: 1 %
HCT: 36.2 % (ref 36.0–46.0)
Hemoglobin: 11 g/dL — ABNORMAL LOW (ref 12.0–15.0)
Immature Granulocytes: 0 %
Lymphocytes Relative: 14 %
Lymphs Abs: 1.1 10*3/uL (ref 0.7–4.0)
MCH: 28.9 pg (ref 26.0–34.0)
MCHC: 30.4 g/dL (ref 30.0–36.0)
MCV: 95 fL (ref 80.0–100.0)
Monocytes Absolute: 0.9 10*3/uL (ref 0.1–1.0)
Monocytes Relative: 12 %
Neutro Abs: 5.7 10*3/uL (ref 1.7–7.7)
Neutrophils Relative %: 73 %
Platelets: 139 10*3/uL — ABNORMAL LOW (ref 150–400)
RBC: 3.81 MIL/uL — ABNORMAL LOW (ref 3.87–5.11)
RDW: 15.6 % — ABNORMAL HIGH (ref 11.5–15.5)
WBC: 7.7 10*3/uL (ref 4.0–10.5)
nRBC: 0 % (ref 0.0–0.2)

## 2023-06-09 LAB — COMPREHENSIVE METABOLIC PANEL
ALT: 14 U/L (ref 0–44)
AST: 27 U/L (ref 15–41)
Albumin: 2.7 g/dL — ABNORMAL LOW (ref 3.5–5.0)
Alkaline Phosphatase: 124 U/L (ref 38–126)
Anion gap: 11 (ref 5–15)
BUN: 31 mg/dL — ABNORMAL HIGH (ref 8–23)
CO2: 29 mmol/L (ref 22–32)
Calcium: 9 mg/dL (ref 8.9–10.3)
Chloride: 100 mmol/L (ref 98–111)
Creatinine, Ser: 1.49 mg/dL — ABNORMAL HIGH (ref 0.44–1.00)
GFR, Estimated: 36 mL/min — ABNORMAL LOW (ref >60)
Glucose, Bld: 100 mg/dL — ABNORMAL HIGH (ref 70–99)
Potassium: 2.7 mmol/L — CL (ref 3.5–5.1)
Sodium: 140 mmol/L (ref 135–145)
Total Bilirubin: 1.3 mg/dL — ABNORMAL HIGH (ref 0.3–1.2)
Total Protein: 5.8 g/dL — ABNORMAL LOW (ref 6.5–8.1)

## 2023-06-09 LAB — I-STAT CHEM 8, ED
BUN: 31 mg/dL — ABNORMAL HIGH (ref 8–23)
Calcium, Ion: 1.17 mmol/L (ref 1.15–1.40)
Chloride: 99 mmol/L (ref 98–111)
Creatinine, Ser: 1.5 mg/dL — ABNORMAL HIGH (ref 0.44–1.00)
Glucose, Bld: 95 mg/dL (ref 70–99)
HCT: 34 % — ABNORMAL LOW (ref 36.0–46.0)
Hemoglobin: 11.6 g/dL — ABNORMAL LOW (ref 12.0–15.0)
Potassium: 2.6 mmol/L — CL (ref 3.5–5.1)
Sodium: 141 mmol/L (ref 135–145)
TCO2: 32 mmol/L (ref 22–32)

## 2023-06-09 LAB — I-STAT CG4 LACTIC ACID, ED: Lactic Acid, Venous: 1.4 mmol/L (ref 0.5–1.9)

## 2023-06-09 LAB — MAGNESIUM: Magnesium: 2.7 mg/dL — ABNORMAL HIGH (ref 1.7–2.4)

## 2023-06-09 MED ORDER — LEVOTHYROXINE SODIUM 50 MCG PO TABS
50.0000 ug | ORAL_TABLET | Freq: Every day | ORAL | Status: DC
  Administered 2023-06-10 – 2023-06-15 (×6): 50 ug via ORAL
  Filled 2023-06-09 (×6): qty 1

## 2023-06-09 MED ORDER — ALLOPURINOL 100 MG PO TABS
50.0000 mg | ORAL_TABLET | Freq: Every day | ORAL | Status: DC
  Administered 2023-06-10 – 2023-06-15 (×6): 50 mg via ORAL
  Filled 2023-06-09 (×6): qty 1

## 2023-06-09 MED ORDER — ACETAMINOPHEN 325 MG PO TABS
650.0000 mg | ORAL_TABLET | Freq: Once | ORAL | Status: AC
  Administered 2023-06-09: 650 mg via ORAL
  Filled 2023-06-09: qty 2

## 2023-06-09 MED ORDER — PANTOPRAZOLE SODIUM 40 MG PO TBEC
40.0000 mg | DELAYED_RELEASE_TABLET | Freq: Every day | ORAL | Status: DC
  Administered 2023-06-10 – 2023-06-15 (×6): 40 mg via ORAL
  Filled 2023-06-09 (×6): qty 1

## 2023-06-09 MED ORDER — ACETAMINOPHEN 650 MG RE SUPP: 650.0000 mg | Freq: Four times a day (QID) | RECTAL | Status: DC | PRN

## 2023-06-09 MED ORDER — CARVEDILOL 12.5 MG PO TABS
12.5000 mg | ORAL_TABLET | Freq: Two times a day (BID) | ORAL | Status: DC
  Administered 2023-06-10 – 2023-06-15 (×9): 12.5 mg via ORAL
  Filled 2023-06-09 (×9): qty 1

## 2023-06-09 MED ORDER — LACTULOSE 10 GM/15ML PO SOLN
30.0000 g | Freq: Three times a day (TID) | ORAL | Status: DC
  Administered 2023-06-09 – 2023-06-11 (×5): 30 g via ORAL
  Filled 2023-06-09 (×5): qty 45

## 2023-06-09 MED ORDER — DICYCLOMINE HCL 10 MG PO CAPS
10.0000 mg | ORAL_CAPSULE | Freq: Once | ORAL | Status: AC
  Administered 2023-06-09: 10 mg via ORAL
  Filled 2023-06-09: qty 1

## 2023-06-09 MED ORDER — ONDANSETRON HCL 4 MG PO TABS: 4.0000 mg | ORAL_TABLET | Freq: Four times a day (QID) | ORAL | Status: DC | PRN

## 2023-06-09 MED ORDER — ACETAMINOPHEN 325 MG PO TABS
650.0000 mg | ORAL_TABLET | Freq: Four times a day (QID) | ORAL | Status: DC | PRN
  Administered 2023-06-09 – 2023-06-15 (×15): 650 mg via ORAL
  Filled 2023-06-09 (×15): qty 2

## 2023-06-09 MED ORDER — ONDANSETRON HCL 4 MG/2ML IJ SOLN
4.0000 mg | Freq: Four times a day (QID) | INTRAMUSCULAR | Status: DC | PRN
  Administered 2023-06-11: 4 mg via INTRAVENOUS
  Filled 2023-06-09: qty 2

## 2023-06-09 MED ORDER — POTASSIUM CHLORIDE 10 MEQ/100ML IV SOLN
10.0000 meq | INTRAVENOUS | Status: AC
  Administered 2023-06-09 (×2): 10 meq via INTRAVENOUS
  Filled 2023-06-09 (×2): qty 100

## 2023-06-09 MED ORDER — APIXABAN 5 MG PO TABS
5.0000 mg | ORAL_TABLET | Freq: Two times a day (BID) | ORAL | Status: DC
  Administered 2023-06-09 – 2023-06-15 (×12): 5 mg via ORAL
  Filled 2023-06-09 (×12): qty 1

## 2023-06-09 MED ORDER — SODIUM CHLORIDE 0.9% FLUSH
3.0000 mL | Freq: Two times a day (BID) | INTRAVENOUS | Status: DC
  Administered 2023-06-09 – 2023-06-14 (×6): 3 mL via INTRAVENOUS

## 2023-06-09 MED ORDER — IOHEXOL 350 MG/ML SOLN
50.0000 mL | Freq: Once | INTRAVENOUS | Status: AC | PRN
  Administered 2023-06-09: 50 mL via INTRAVENOUS

## 2023-06-09 MED ORDER — SORBITOL 70 % SOLN: 960.0000 mL | TOPICAL_OIL | Freq: Once | ORAL | Status: DC | PRN

## 2023-06-09 MED ORDER — PRAVASTATIN SODIUM 40 MG PO TABS
20.0000 mg | ORAL_TABLET | Freq: Every day | ORAL | Status: DC
  Administered 2023-06-10 – 2023-06-14 (×5): 20 mg via ORAL
  Filled 2023-06-09 (×5): qty 1

## 2023-06-09 MED ORDER — BISACODYL 10 MG RE SUPP: 10.0000 mg | Freq: Every day | RECTAL | Status: DC | PRN

## 2023-06-09 MED ORDER — POTASSIUM CHLORIDE 10 MEQ/100ML IV SOLN
10.0000 meq | INTRAVENOUS | Status: AC
  Administered 2023-06-09 – 2023-06-10 (×4): 10 meq via INTRAVENOUS
  Filled 2023-06-09 (×4): qty 100

## 2023-06-09 MED ORDER — SENNOSIDES-DOCUSATE SODIUM 8.6-50 MG PO TABS
2.0000 | ORAL_TABLET | Freq: Two times a day (BID) | ORAL | Status: DC
  Administered 2023-06-09 – 2023-06-10 (×2): 2 via ORAL
  Filled 2023-06-09 (×2): qty 2

## 2023-06-09 MED ORDER — RIFAXIMIN 550 MG PO TABS
550.0000 mg | ORAL_TABLET | Freq: Two times a day (BID) | ORAL | Status: DC
  Administered 2023-06-09 – 2023-06-15 (×12): 550 mg via ORAL
  Filled 2023-06-09 (×13): qty 1

## 2023-06-09 NOTE — ED Notes (Signed)
RN spoke with son, [name]. He is aware mother is at [facility].

## 2023-06-09 NOTE — ED Notes (Signed)
ED TO INPATIENT HANDOFF REPORT  ED Nurse Name and Phone #: Rodney Booze [phone]  S Name/Age/Gender Katelyn Jackson 77 y.o. female Room/Bed: 007C/007C  Code Status   Code Status: Full Code  Home/SNF/Other Skilled nursing facility/[facility] Patient oriented to: self, place, time, and situation Is this baseline? Yes   Triage Complete: Triage complete  Chief Complaint Hypokalemia [E87.6]  Triage Note Pt bib ems from [facility] c/o constipation. Pt hasn't had a BM in 5 days and abdominal pain started 3 days ago. EMS stated facility tried an enema but unsuccessful. Pt at rehab for left shoulder and left leg fx.    Allergies Allergies  Allergen Reactions   Hibiclens [Chlorhexidine Gluconate] Hives   Penicillins Itching    Tolerated Cephalosporin Date: 03/17/21. Td Ancef 2g 07/26/21    Level of Care/Admitting Diagnosis ED Disposition     ED Disposition  Admit   Condition  --   Comment  Hospital Area: [facility] [[id number]]  Level of Care: Telemetry Medical [104]  May place patient in observation at [facility] or [facility] if equivalent level of care is available:: No  Covid Evaluation: Asymptomatic - no recent exposure (last 10 days) testing not required  Diagnosis: Hypokalemia [172180]  Admitting Physician: Charlsie Quest [[id number]]  Attending Physician: Charlsie Quest [[id number]]          B Medical/Surgery History Past Medical History:  Diagnosis Date   Arthritis    Breast lesion 12/07/2021   Cancer [facility])    breast cancer   Chronic atrial fibrillation (HCC)    Chronic kidney disease    sees Dr Lowell Guitar   Colonization with VRE (vancomycin-resistant enterococcus) 09/22/2021   Cramps, muscle, general    Dry skin 09/22/2021   Dyspnea on exertion    Dysrhythmia    a-fib,    ESBL E. coli carrier 09/22/2021   GERD (gastroesophageal reflux disease)    Headache    Herpes labialis 08/19/2021   Hyperlipidemia    Hypertension     Hypothyroidism    Lymphedema    Moderate to severe pulmonary hypertension (HCC)    Obesity    Osteoarthritis of right knee 08/19/2021   Personal history of radiation therapy    Pneumonia 12/2020   Presence of permanent cardiac pacemaker 01/21/2021   for bradycardia    Syncope 12/2020   needed a pacemaker   Past Surgical History:  Procedure Laterality Date   APPENDECTOMY     BREAST BIOPSY Left 2018   BREAST LUMPECTOMY Left 04/04/2017   x3   BREAST LUMPECTOMY WITH NEEDLE LOCALIZATION AND AXILLARY SENTINEL LYMPH NODE BX Left 04/04/2017   Procedure: BREAST LUMPECTOMY WITH NEEDLE LOCALIZATION x3 AND AXILLARY SENTINEL LYMPH NODE BX;  Surgeon: Almond Lint, MD;  Location: [facility] OR;  Service: General;  Laterality: Left;   CATARACT EXTRACTION W/ INTRAOCULAR LENS  IMPLANT, BILATERAL Bilateral 2018   COLONOSCOPY     EYE SURGERY     GLAUCOMA SURGERY Bilateral 2018   HARDWARE REMOVAL Right 07/26/2021   Procedure: RIGHT SHOULDER HARDWARE REMOVAL WITH WASHOUT;  Surgeon: Bjorn Pippin, MD;  Location: [facility] ORS;  Service: Orthopedics;  Laterality: Right;   INSERT / REPLACE / REMOVE PACEMAKER  01/21/2021   IR FLUORO GUIDE CV LINE LEFT  07/29/2021   IR REMOVAL TUN CV CATH W/O FL  09/07/2021   IR US GUIDE BX ASP/DRAIN  07/16/2021   RE-EXCISION OF BREAST CANCER,SUPERIOR MARGINS Left 04/26/2017   Procedure: RE-EXCISION OF LEFT BREAST CANCER;  Surgeon: Almond Lint, MD;  Location: [facility] OR;  Service: General;  Laterality: Left;   SHOULDER HEMI-ARTHROPLASTY Right 03/17/2021   Procedure: SHOULDER HEMI-ARTHROPLASTY;  Surgeon: Bjorn Pippin, MD;  Location: [facility] ORS;  Service: Orthopedics;  Laterality: Right;     A IV Location/Drains/Wounds Patient Lines/Drains/Airways Status     Active Line/Drains/Airways     Name Placement date Placement time Site Days   Peripheral IV 06/09/23 20 G 1.88" Anterior;Right Forearm 06/09/23  1647  Forearm  less than 1   Pressure Injury 07/03/21 Thigh Posterior;Left;Upper Stage 3 -  Full  thickness tissue loss. Subcutaneous fat may be visible but bone, tendon or muscle are NOT exposed. 07/03/21  0453  -- 706   Pressure Injury 07/03/21 Sacrum Stage 2 -  Partial thickness loss of dermis presenting as a shallow open injury with a red, pink wound bed without slough. sacrum/inner gluteal fold 07/03/21  2105  -- 706   Wound / Incision (Open or Dehisced) 07/11/21 Shoulder Right Redness 07/11/21  1135  Shoulder  698   Wound / Incision (Open or Dehisced) 05/18/23 Thigh Posterior;Right;Upper Stage 3 05/18/23  1000  Thigh  22   Wound / Incision (Open or Dehisced) 05/18/23 Thigh Right;Lower;Posterior full thickness wound 05/18/23  1000  Thigh  22   Wound / Incision (Open or Dehisced) 05/19/23 Other (Comment) Thigh Left;Posterior;Lower full thickness wound 05/19/23  --  Thigh  21            Intake/Output Last 24 hours  Intake/Output Summary (Last 24 hours) at 06/09/2023 2124 Last data filed at 06/09/2023 2049 Gross per 24 hour  Intake 100 ml  Output --  Net 100 ml    Labs/Imaging Results for orders placed or performed during the hospital encounter of 06/09/23 (from the past 48 hour(s))  Comprehensive metabolic panel     Status: Abnormal   Collection Time: 06/09/23  5:28 PM  Result Value Ref Range   Sodium 140 135 - 145 mmol/L   Potassium 2.7 (LL) 3.5 - 5.1 mmol/L    Comment: CRITICAL RESULT CALLED TO, READ BACK BY AND VERIFIED WITH Wandra Mannan, RN @ [phone] 06/09/23 BY SEKDAHL   Chloride 100 98 - 111 mmol/L   CO2 29 22 - 32 mmol/L   Glucose, Bld 100 (H) 70 - 99 mg/dL    Comment: Glucose reference range applies only to samples taken after fasting for at least 8 hours.   BUN 31 (H) 8 - 23 mg/dL   Creatinine, Ser 1.19 (H) 0.44 - 1.00 mg/dL   Calcium 9.0 8.9 - 14.7 mg/dL   Total Protein 5.8 (L) 6.5 - 8.1 g/dL   Albumin 2.7 (L) 3.5 - 5.0 g/dL   AST 27 15 - 41 U/L   ALT 14 0 - 44 U/L   Alkaline Phosphatase 124 38 - 126 U/L   Total Bilirubin 1.3 (H) 0.3 - 1.2 mg/dL   GFR, Estimated 36  (L) >60 mL/min    Comment: (NOTE) Calculated using the CKD-EPI Creatinine Equation (2021)    Anion gap 11 5 - 15    Comment: Performed at [facility], [address], [place], [place] [address]  CBC with Differential     Status: Abnormal   Collection Time: 06/09/23  5:28 PM  Result Value Ref Range   WBC 7.7 4.0 - 10.5 K/uL   RBC 3.81 (L) 3.87 - 5.11 MIL/uL   Hemoglobin 11.0 (L) 12.0 - 15.0 g/dL   HCT 21.3 08.6 - 57.8 %  MCV 95.0 80.0 - 100.0 fL   MCH 28.9 26.0 - 34.0 pg   MCHC 30.4 30.0 - 36.0 g/dL   RDW 16.1 (H) 09.6 - 04.5 %   Platelets 139 (L) 150 - 400 K/uL   nRBC 0.0 0.0 - 0.2 %   Neutrophils Relative % 73 %   Neutro Abs 5.7 1.7 - 7.7 K/uL   Lymphocytes Relative 14 %   Lymphs Abs 1.1 0.7 - 4.0 K/uL   Monocytes Relative 12 %   Monocytes Absolute 0.9 0.1 - 1.0 K/uL   Eosinophils Relative 1 %   Eosinophils Absolute 0.0 0.0 - 0.5 K/uL   Basophils Relative 0 %   Basophils Absolute 0.0 0.0 - 0.1 K/uL   Immature Granulocytes 0 %   Abs Immature Granulocytes 0.02 0.00 - 0.07 K/uL    Comment: Performed at [facility], [address], [place], [place] [address]  I-stat chem 8, ED     Status: Abnormal   Collection Time: 06/09/23  6:09 PM  Result Value Ref Range   Sodium 141 135 - 145 mmol/L   Potassium 2.6 (LL) 3.5 - 5.1 mmol/L   Chloride 99 98 - 111 mmol/L   BUN 31 (H) 8 - 23 mg/dL   Creatinine, Ser 1.91 (H) 0.44 - 1.00 mg/dL   Glucose, Bld 95 70 - 99 mg/dL    Comment: Glucose reference range applies only to samples taken after fasting for at least 8 hours.   Calcium, Ion 1.17 1.15 - 1.40 mmol/L   TCO2 32 22 - 32 mmol/L   Hemoglobin 11.6 (L) 12.0 - 15.0 g/dL   HCT 47.8 (L) 29.5 - 62.1 %  Magnesium     Status: Abnormal   Collection Time: 06/09/23  8:03 PM  Result Value Ref Range   Magnesium 2.7 (H) 1.7 - 2.4 mg/dL    Comment: Performed at [facility], [address] [place], [place] [address]  Urinalysis, Routine w reflex microscopic -Urine, Clean  Catch     Status: Abnormal   Collection Time: 06/09/23  8:08 PM  Result Value Ref Range   Color, Urine YELLOW YELLOW   APPearance HAZY (A) CLEAR   Specific Gravity, Urine 1.017 1.005 - 1.030   pH 6.0 5.0 - 8.0   Glucose, UA NEGATIVE NEGATIVE mg/dL   Hgb urine dipstick SMALL (A) NEGATIVE   Bilirubin Urine NEGATIVE NEGATIVE   Ketones, ur NEGATIVE NEGATIVE mg/dL   Protein, ur 30 (A) NEGATIVE mg/dL   Nitrite POSITIVE (A) NEGATIVE   Leukocytes,Ua LARGE (A) NEGATIVE   RBC / HPF 6-10 0 - 5 RBC/hpf   WBC, UA >50 0 - 5 WBC/hpf   Bacteria, UA RARE (A) NONE SEEN   Squamous Epithelial / HPF 0-5 0 - 5 /HPF   Mucus PRESENT    Hyaline Casts, UA PRESENT     Comment: Performed at [facility], [address], [place], [place] [address]  I-Stat CG4 Lactic Acid     Status: None   Collection Time: 06/09/23  8:14 PM  Result Value Ref Range   Lactic Acid, Venous 1.4 0.5 - 1.9 mmol/L   CT ABDOMEN PELVIS W CONTRAST  Result Date: 06/09/2023 CLINICAL DATA:  Left lower quadrant abdominal pain. EXAM: CT ABDOMEN AND PELVIS WITH CONTRAST TECHNIQUE: Multidetector CT imaging of the abdomen and pelvis was performed using the standard protocol following bolus administration of intravenous contrast. RADIATION DOSE REDUCTION: This exam was performed according to the departmental dose-optimization  program which includes automated exposure control, adjustment of the mA and/or kV according to patient size and/or use of iterative reconstruction technique. CONTRAST:  50mL OMNIPAQUE IOHEXOL 350 MG/ML SOLN COMPARISON:  None Available. FINDINGS: Lower chest: The heart is enlarged. Hepatobiliary: No suspicious focal abnormality within the liver parenchyma. Gallbladder is distended. Layering sludge evident. No intrahepatic or extrahepatic biliary dilation. Pancreas: No focal mass lesion. No dilatation of the main duct. No intraparenchymal cyst. No peripancreatic edema. Spleen: No splenomegaly. No suspicious focal mass lesion.  Adrenals/Urinary Tract: No adrenal nodule or mass. Tiny nonobstructing stones are seen in the interpolar and lower pole regions of the right kidney. Left kidney unremarkable. No evidence for hydroureter. Bladder is decompressed by Foley catheter. Stomach/Bowel: Moderate hiatal hernia. Stomach otherwise unremarkable. Duodenum is normally positioned as is the ligament of Treitz. No small bowel wall thickening. No small bowel dilatation. The appendix is not discretely visible, but there is no edema or inflammation in the region of the cecal tip to suggest appendicitis. Large volume of stool throughout the colon with stool distended rectum measuring up to 9.0 x 8.3 cm diameter. There is perirectal edema/inflammation. Vascular/Lymphatic: There is mild atherosclerotic calcification of the abdominal aorta without aneurysm. There is no gastrohepatic or hepatoduodenal ligament lymphadenopathy. No retroperitoneal or mesenteric lymphadenopathy. No pelvic sidewall lymphadenopathy. Reproductive: Heterogeneous endometrial stripe thickening in the fundal region measures up to 17 mm. Calcified uterine fibroids evident. There is no adnexal mass. Other: No intraperitoneal free fluid. Musculoskeletal: No worrisome lytic or sclerotic osseous abnormality. Small right groin hernia contains only fat. IMPRESSION: 1. Large volume of stool throughout the colon with stool distended rectum measuring up to 9.0 x 8.3 cm diameter. There is perirectal edema/inflammation. Imaging features suggest fecal impaction. Stercoral colitis not excluded. 2. Heterogeneous endometrial stripe thickening in the fundal region measures up to 17 mm. Correlation for abnormal vaginal bleeding recommended. Pelvic ultrasound recommended to further evaluate. 3. Moderate hiatal hernia. 4. Tiny nonobstructing right renal stones. 5.  Aortic Atherosclerosis (ICD10-I70.0). Electronically Signed   By: Kennith Center M.D.   On: 06/09/2023 19:08    Pending Labs Unresulted  Labs (From admission, onward)     Start     Ordered   06/10/23 0500  Basic metabolic panel  Tomorrow morning,   R        06/09/23 2123   06/10/23 0500  CBC  Tomorrow morning,   R        06/09/23 2123            Vitals/Pain Today's Vitals   06/09/23 1501 06/09/23 1502 06/09/23 1505 06/09/23 2028  BP:   128/67 (!) 105/52  Pulse:   63 69  Resp:   18 19  Temp:   98.9 F (37.2 C) 98 F (36.7 C)  TempSrc:   Oral Oral  SpO2:   99% 100%  Weight:  92 kg    Height:  5\' 1"  (1.549 m)    PainSc: 8        Isolation Precautions No active isolations  Medications Medications  potassium chloride 10 mEq in 100 mL IVPB (10 mEq Intravenous New Bag/Given 06/09/23 2059)  sodium chloride flush (NS) 0.9 % injection 3 mL (has no administration in time range)  acetaminophen (TYLENOL) tablet 650 mg (has no administration in time range)    Or  acetaminophen (TYLENOL) suppository 650 mg (has no administration in time range)  ondansetron (ZOFRAN) tablet 4 mg (has no administration in time range)    Or  ondansetron (ZOFRAN) injection 4 mg (has no administration in time range)  senna-docusate (Senokot-S) tablet 2 tablet (has no administration in time range)  bisacodyl (DULCOLAX) suppository 10 mg (has no administration in time range)  sorbitol, milk of mag, mineral oil, glycerin (SMOG) enema (has no administration in time range)  dicyclomine (BENTYL) capsule 10 mg (10 mg Oral Given 06/09/23 1722)  acetaminophen (TYLENOL) tablet 650 mg (650 mg Oral Given 06/09/23 1722)  iohexol (OMNIPAQUE) 350 MG/ML injection 50 mL (50 mLs Intravenous Contrast Given 06/09/23 1846)    Mobility non-ambulatory     Focused Assessments Cardiac Assessment Handoff:    Lab Results  Component Value Date   TROPONINI 0.04 (HH) 06/19/2017   No results found for: "DDIMER" Does the Patient currently have chest pain? No    R Recommendations: See Admitting Provider Note  Report given to:   Additional Notes:

## 2023-06-09 NOTE — ED Provider Notes (Signed)
[place] EMERGENCY DEPARTMENT AT [facility] Provider Note   CSN: [id number] Arrival date & time: 06/09/23  1441     History  No chief complaint on file.   Katelyn Jackson is a 77 y.o. female.  Patient is a 77 year old female with a past medical history of A-fib on Eliquis, hypothyroidism, CKD and recent admission for patellar fracture presenting to the emergency department with abdominal pain.  Patient states that she was discharged about 5 days ago and has not had a bowel movement since she left the hospital.  She states that she has had significantly increased lower abdominal pain and cramping.  She states that she has associated nausea, denies any vomiting.  Denies any fevers or chills.  She denies any dysuria but reported some intermittent hematuria.  She states that she does have a Foley catheter in.  Patient did have 3 days of antibiotics while admitted for a UTI.  A language interpreter was used [place] ID number [phone]).       Home Medications Prior to Admission medications   Medication Sig Start Date End Date Taking? Authorizing Provider  allopurinol (ZYLOPRIM) 100 MG tablet Take 0.5 tablets (50 mg total) by mouth daily. 11/23/22   Plotnikov, Georgina Quint, MD  apixaban (ELIQUIS) 5 MG TABS tablet Take 1 tablet (5 mg total) by mouth 2 (two) times daily. 11/23/22   Plotnikov, Georgina Quint, MD  carvedilol (COREG) 12.5 MG tablet Take 12.5 mg by mouth 2 (two) times daily with a meal.    [provider]  clotrimazole-betamethasone (LOTRISONE) cream Apply 1 Application topically 2 (two) times daily.    [provider]  Control Gel Formula Dressing (DUODERM CGF BORDER DRESSING EX) Apply 1 Application topically daily.    [provider]  diclofenac Sodium (VOLTAREN) 1 % GEL APPLY FOUR GRAMS FOUR TIMES DAILY AS NEEDED 12/22/22   Plotnikov, Georgina Quint, MD  lactulose (CHRONULAC) 10 GM/15ML solution TAKE 15 MILLILITERS BY MOUTH THREE TIMESDAILY Patient taking  differently: Take 15 mLs by mouth 3 (three) times daily. TAKE 15 MILLILITERS BY MOUTH THREE TIMESDAILY 02/10/23   Plotnikov, Georgina Quint, MD  levothyroxine (SYNTHROID) 50 MCG tablet Take 1 tablet (50 mcg total) by mouth daily before breakfast. Overdue for Annual appt must see provider for future refills 11/23/22   Plotnikov, Georgina Quint, MD  lipase/protease/amylase (CREON) 12000-38000 units CPEP capsule Take 1 capsule (12,000 Units total) by mouth daily as needed (stomach problems). 11/25/21   Plotnikov, Georgina Quint, MD  lovastatin (MEVACOR) 20 MG tablet Take 20 mg by mouth at bedtime.    [provider]  magnesium oxide (MAG-OX) 400 MG tablet Take 1 tablet (400 mg total) by mouth 2 (two) times daily. 09/15/22   Plotnikov, Georgina Quint, MD  omeprazole (PRILOSEC) 40 MG capsule TAKE ONE CAPSULE BY MOUTH DAILY 08/16/22   Plotnikov, Georgina Quint, MD  potassium chloride (KLOR-CON) 8 MEQ tablet TAKE ONE (1) TABLET BY MOUTH EVERY DAY 11/30/22   Plotnikov, Georgina Quint, MD  rifaximin (XIFAXAN) 550 MG TABS tablet Take 1 tablet (550 mg total) by mouth 2 (two) times daily. 06/16/22   Plotnikov, Georgina Quint, MD  torsemide (DEMADEX) 100 MG tablet Take 1 tablet (100 mg total) by mouth 2 (two) times daily. Patient taking differently: Take 40 mg by mouth in the morning. Take 40mg  in the morning and 20mg  in the evening 11/25/21   Plotnikov, Georgina Quint, MD  triamcinolone cream (KENALOG) 0.1 % Apply topically 2 (two) times daily. 02/01/23  Plotnikov, Georgina Quint, MD  trolamine salicylate (BLUE-EMU MAXIMUM PAIN RELIEF) 10 % cream Apply 1 Application topically as needed for muscle pain. 09/15/22   Plotnikov, Georgina Quint, MD      Allergies    Chlorhexidine gluconate and Penicillins    Review of Systems   Review of Systems  Physical Exam Updated Vital Signs BP 128/67 (BP Location: Right Arm)   Pulse 63   Temp 98.9 F (37.2 C) (Oral)   Resp 18   Ht 5\' 1"  (1.549 m)   Wt 92 kg   LMP  (LMP Unknown)   SpO2 99%   BMI 38.32 kg/m   Physical Exam Vitals and nursing note reviewed.  Constitutional:      General: She is not in acute distress.    Appearance: Normal appearance.  HENT:     Head: Normocephalic and atraumatic.     Nose: Nose normal.     Mouth/Throat:     Mouth: Mucous membranes are moist.     Pharynx: Oropharynx is clear.  Eyes:     Extraocular Movements: Extraocular movements intact.     Conjunctiva/sclera: Conjunctivae normal.  Cardiovascular:     Rate and Rhythm: Normal rate and regular rhythm.     Heart sounds: Normal heart sounds.  Pulmonary:     Effort: Pulmonary effort is normal.     Breath sounds: Normal breath sounds.  Abdominal:     General: Abdomen is flat.     Palpations: Abdomen is soft.     Tenderness: There is abdominal tenderness (Suprapubic, left lower quadrant). There is guarding. There is no rebound.  Genitourinary:    Comments: Foley catheter in place draining clear yellow urine Musculoskeletal:        General: Normal range of motion.     Cervical back: Normal range of motion.     Comments: Left upper extremity in sling  Skin:    General: Skin is warm and dry.  Neurological:     General: No focal deficit present.     Mental Status: She is alert and oriented to person, place, and time.  Psychiatric:        Mood and Affect: Mood normal.        Behavior: Behavior normal.     ED Results / Procedures / Treatments   Labs (all labs ordered are listed, but only abnormal results are displayed) Labs Reviewed  COMPREHENSIVE METABOLIC PANEL  CBC WITH DIFFERENTIAL/PLATELET  URINALYSIS, ROUTINE W REFLEX MICROSCOPIC  I-STAT CHEM 8, ED    EKG None  Radiology No results found.  Procedures Procedures    Medications Ordered in ED Medications  dicyclomine (BENTYL) capsule 10 mg (has no administration in time range)  acetaminophen (TYLENOL) tablet 650 mg (has no administration in time range)    ED Course/ Medical Decision Making/ A&P Clinical Course as of 06/09/23  1615  Fri Jun 09, 2023  1615 Patient signed out to Dr. Rush Landmark pending labs and CT. [VK]    Clinical Course User Index [VK] Rexford Maus, DO                                 Medical Decision Making This patient presents to the ED with chief complaint(s) of abdominal pain with pertinent past medical history of A-fib on Eliquis, CKD, pacemaker in place, recent admission for patellar fracture which further complicates the presenting complaint. The complaint involves an extensive differential diagnosis and  also carries with it a high risk of complications and morbidity.    The differential diagnosis includes constipation, SBO, ileus, diverticulitis, UTI, other intra-abdominal infection  Additional history obtained: Additional history obtained from N/A Records reviewed previous admission documents and Nursing Home Documents  ED Course and Reassessment: On patient's arrival she is hemodynamically stable in no acute distress.  She does have significant abdominal tenderness and will have labs and CT performed to evaluate for cause of her pain.  She will be given Tylenol and Bentyl for pain control and will be closely reassessed.  Independent labs interpretation:  -Pending  Independent visualization of imaging: - Pending     Amount and/or Complexity of Data Reviewed Labs: ordered. Radiology: ordered.  Risk OTC drugs. Prescription drug management.          Final Clinical Impression(s) / ED Diagnoses Final diagnoses:  None    Rx / DC Orders ED Discharge Orders     None         Rexford Maus, DO 06/09/23 1615

## 2023-06-09 NOTE — ED Notes (Signed)
Pelvic US not completed , clicked off on by accident

## 2023-06-09 NOTE — ED Notes (Signed)
Attempted 2 sticks to the right arm with no success.Left arm is in a sling and painful to move. IV team consult put in.

## 2023-06-09 NOTE — ED Triage Notes (Signed)
Pt bib ems from [facility] c/o constipation. Pt hasn't had a BM in 5 days and abdominal pain started 3 days ago. EMS stated facility tried an enema but unsuccessful. Pt at rehab for left shoulder and left leg fx.

## 2023-06-09 NOTE — H&P (Signed)
History and Physical    Katelyn Jackson WUJ:[id number] DOB: Jun 06, 1946 DOA: 06/09/2023  PCP: Tresa Garter, MD  Patient coming from: [name]  I have personally briefly reviewed patient's old medical records in [facility]  Chief Complaint: Abdominal pain, constipation  HPI: Katelyn Jackson is a Russian-speaking 77 y.o. female with medical history significant for permanent atrial fibrillation on Eliquis, s/p PPM, CKD stage IIIb, hepatic cirrhosis, HTN, HLD, hypothyroidism, chronic lymphedema, gout who presented to the ED for evaluation of abdominal pain and constipation.  Interpreter services offered but family preferred to help with interpretation themselves.  Patient recently admitted 05/17/2023-05/24/2023 after a fall which resulted in a left patellar fracture and left distal clavicle fracture.  Orthopedics recommended conservative management with knee immobilizer and sling respectively.  She was also treated for UTI with 3 days IV ceftriaxone.  She was discharged to SNF.  Patient states that she has not had a bowel movement for the last 5 days.  3 days ago she began to have generalized abdominal pain.  Foley catheter was placed sometime in the last few days, unclear if she was having urinary retention.  She was reportedly given an enema at her SNF prior to arrival.  She says she did have a very small bowel movement here while in the ED.  She has not had any fevers, chills, diaphoresis, chest pain, dyspnea.  She has chronic lymphedema but states lower extremity swelling is actually improved compared to baseline.  She has been on torsemide 40 mg a.m. and 20 mg p.m. and has been taking potassium supplement twice daily.  Also taking Eliquis and has not had any obvious bleeding.  ED Course  Labs/Imaging on admission: I have personally reviewed following labs and imaging studies.  Initial vitals showed BP 128/67, pulse 63, RR 18, temp 98.9 F, SpO2 99% on room  air.  Labs show sodium 140, potassium 2.7, bicarb 29, BUN 31, creatinine 1.49, serum glucose 100, WBC 7.7, hemoglobin 11.0, platelets 139,000, lactic acid 1.4.  Magnesium in process.  CT abdomen/pelvis with contrast showed large volume of stool throughout the colon with stool distended rectum and perirectal edema/inflammation.  Heterogeneous endometrial stripe thickening in the fundal region measuring up to 17 mm noted.  Moderate hiatal hernia and tiny nonobstructing right renal stones also reported.  Patient was given IV K 10 mEq x 2.  The hospitalist service was consulted to admit for further evaluation and management.  Review of Systems: All systems reviewed and are negative except as documented in history of present illness above.   Past Medical History:  Diagnosis Date   Arthritis    Breast lesion 12/07/2021   Cancer [facility])    breast cancer   Chronic atrial fibrillation (HCC)    Chronic kidney disease    sees Dr Lowell Guitar   Colonization with VRE (vancomycin-resistant enterococcus) 09/22/2021   Cramps, muscle, general    Dry skin 09/22/2021   Dyspnea on exertion    Dysrhythmia    a-fib,    ESBL E. coli carrier 09/22/2021   GERD (gastroesophageal reflux disease)    Headache    Herpes labialis 08/19/2021   Hyperlipidemia    Hypertension    Hypothyroidism    Lymphedema    Moderate to severe pulmonary hypertension (HCC)    Obesity    Osteoarthritis of right knee 08/19/2021   Personal history of radiation therapy    Pneumonia 12/2020   Presence of permanent cardiac pacemaker 01/21/2021   for bradycardia  Syncope 12/2020   needed a pacemaker    Past Surgical History:  Procedure Laterality Date   APPENDECTOMY     BREAST BIOPSY Left 2018   BREAST LUMPECTOMY Left 04/04/2017   x3   BREAST LUMPECTOMY WITH NEEDLE LOCALIZATION AND AXILLARY SENTINEL LYMPH NODE BX Left 04/04/2017   Procedure: BREAST LUMPECTOMY WITH NEEDLE LOCALIZATION x3 AND AXILLARY SENTINEL LYMPH NODE BX;   Surgeon: Almond Lint, MD;  Location: [facility] OR;  Service: General;  Laterality: Left;   CATARACT EXTRACTION W/ INTRAOCULAR LENS  IMPLANT, BILATERAL Bilateral 2018   COLONOSCOPY     EYE SURGERY     GLAUCOMA SURGERY Bilateral 2018   HARDWARE REMOVAL Right 07/26/2021   Procedure: RIGHT SHOULDER HARDWARE REMOVAL WITH WASHOUT;  Surgeon: Bjorn Pippin, MD;  Location: [facility] ORS;  Service: Orthopedics;  Laterality: Right;   INSERT / REPLACE / REMOVE PACEMAKER  01/21/2021   IR FLUORO GUIDE CV LINE LEFT  07/29/2021   IR REMOVAL TUN CV CATH W/O FL  09/07/2021   IR US GUIDE BX ASP/DRAIN  07/16/2021   RE-EXCISION OF BREAST CANCER,SUPERIOR MARGINS Left 04/26/2017   Procedure: RE-EXCISION OF LEFT BREAST CANCER;  Surgeon: Almond Lint, MD;  Location: [facility] OR;  Service: General;  Laterality: Left;   SHOULDER HEMI-ARTHROPLASTY Right 03/17/2021   Procedure: SHOULDER HEMI-ARTHROPLASTY;  Surgeon: Bjorn Pippin, MD;  Location: [facility] ORS;  Service: Orthopedics;  Laterality: Right;    Social History:  reports that she has never smoked. She has never used smokeless tobacco. She reports that she does not drink alcohol and does not use drugs.  Allergies  Allergen Reactions   Hibiclens [Chlorhexidine Gluconate] Hives   Penicillins Itching    Tolerated Cephalosporin Date: 03/17/21. Td Ancef 2g 07/26/21    Family History  Problem Relation Age of Onset   CAD Mother 20   Stroke Mother 29       hemorr CVA   COPD Father 58   Cancer Neg Hx      Prior to Admission medications   Medication Sig Start Date End Date Taking? Authorizing Provider  allopurinol (ZYLOPRIM) 100 MG tablet Take 0.5 tablets (50 mg total) by mouth daily. 11/23/22  Yes Plotnikov, Georgina Quint, MD  apixaban (ELIQUIS) 5 MG TABS tablet Take 1 tablet (5 mg total) by mouth 2 (two) times daily. 11/23/22  Yes Plotnikov, Georgina Quint, MD  bisacodyl (DULCOLAX) 10 MG suppository Place 10 mg rectally once as needed (constipation).   Yes [provider]  carvedilol  (COREG) 12.5 MG tablet Take 12.5 mg by mouth 2 (two) times daily with a meal.   Yes [provider]  lactulose (CHRONULAC) 10 GM/15ML solution TAKE 15 MILLILITERS BY MOUTH THREE TIMESDAILY Patient taking differently: Take 45 mLs by mouth 3 (three) times daily. 02/10/23  Yes Plotnikov, Georgina Quint, MD  levothyroxine (SYNTHROID) 50 MCG tablet Take 1 tablet (50 mcg total) by mouth daily before breakfast. Overdue for Annual appt must see provider for future refills 11/23/22  Yes Plotnikov, Georgina Quint, MD  lovastatin (MEVACOR) 20 MG tablet Take 20 mg by mouth at bedtime.   Yes [provider]  magnesium oxide (MAG-OX) 400 MG tablet Take 1 tablet (400 mg total) by mouth 2 (two) times daily. 09/15/22  Yes Plotnikov, Georgina Quint, MD  Multiple Vitamins-Minerals (DECUBI-VITE) CAPS Take 1 capsule by mouth daily.   Yes [provider]  omeprazole (PRILOSEC) 40 MG capsule TAKE ONE CAPSULE BY MOUTH DAILY Patient taking differently: Take 40 mg by mouth daily  before breakfast. 08/16/22  Yes Plotnikov, Georgina Quint, MD  polyethylene glycol (MIRALAX / GLYCOLAX) 17 g packet Take 17 g by mouth See admin instructions. Take 17g every 8 hours for 1 day. If large BM produced, may D/C after 1st dose.   Yes [provider]  potassium chloride (KLOR-CON) 8 MEQ tablet TAKE ONE (1) TABLET BY MOUTH EVERY DAY 11/30/22  Yes Plotnikov, Georgina Quint, MD  protein supplement (PROSOURCE NO CARB) LIQD Take 30 mLs by mouth 2 (two) times daily.   Yes [provider]  rifaximin (XIFAXAN) 550 MG TABS tablet Take 1 tablet (550 mg total) by mouth 2 (two) times daily. 06/16/22  Yes Plotnikov, Georgina Quint, MD  sennosides-docusate sodium (SENOKOT-S) 8.6-50 MG tablet Take 2 tablets by mouth every 12 (twelve) hours.   Yes [provider]  torsemide (DEMADEX) 100 MG tablet Take 1 tablet (100 mg total) by mouth 2 (two) times daily. Patient taking differently: Take 40 mg by mouth daily. 11/25/21  Yes Plotnikov, Georgina Quint, MD  triamcinolone cream (KENALOG) 0.1 % Apply topically 2 (two) times daily. Patient taking differently: Apply 1 Application topically 2 (two) times daily. To dry patches on BLE and bilateral feet 02/01/23  Yes Plotnikov, Georgina Quint, MD    Physical Exam: Vitals:   06/09/23 1458 06/09/23 1502 06/09/23 1505 06/09/23 2028  BP:   128/67 (!) 105/52  Pulse:   63 69  Resp:   18 19  Temp: 98.9 F (37.2 C)  98.9 F (37.2 C) 98 F (36.7 C)  TempSrc: Oral  Oral Oral  SpO2:   99% 100%  Weight:  92 kg    Height:  5\' 1"  (1.549 m)     Constitutional: Elderly woman resting in bed, NAD, calm, comfortable Eyes: EOMI, lids and conjunctivae normal ENMT: Mucous membranes are moist. Posterior pharynx clear of any exudate or lesions.Normal dentition.  Neck: normal, supple, no masses. Respiratory: clear to auscultation bilaterally, no wheezing, no crackles. Normal respiratory effort. No accessory muscle use.  Cardiovascular: Irregularly irregular, no murmurs / rubs / gallops. No extremity edema. 2+ pedal pulses.  PPM in place. Abdomen: Mild lower abdominal tenderness, no masses palpated.  Musculoskeletal: Left knee immobilizer in place Skin: no rashes, lesions, ulcers. No induration Neurologic: Sensation intact. Strength diminished left upper and lower extremities due to clavicle fracture and knee fracture respectively. Psychiatric: Normal judgment and insight. Alert and oriented x 3. Normal mood.   EKG: Personally reviewed. Atrial fibrillation, rate 70, frequent paced beats.  Rate is faster when compared to prior.  Assessment/Plan Principal Problem:   Hypokalemia Active Problems:   Constipation   Chronic atrial fibrillation (HCC)   Chronic kidney disease, stage 3b (HCC)   Hypothyroidism   Liver cirrhosis (HCC)   Cardiac pacemaker in situ   Clavicle fracture   Closed patellar sleeve fracture of left knee   Foley catheter in place prior to arrival   Increased endometrial stripe thickness    Katelyn Jackson is a 77 y.o. female with medical history significant for permanent atrial fibrillation on Eliquis, s/p PPM, CKD stage IIIb, hepatic cirrhosis, HTN, HLD, hypothyroidism, chronic lymphedema, gout who is admitted with hypokalemia.  Assessment and Plan: Hypokalemia: Likely torsemide related.  Magnesium is 2.7.  Started on IV potassium supplementation.  Constipation: Reports no BM for 5 days however started to have small volume stool out in the ED after she received an enema prior to arrival. -Continue scheduled lactulose and Senokot-S -Dulcolax suppository and/or smog enema if needed -  Correct potassium as above  Foley catheter in place, POA: Placed at SNF prior to arrival.  Good UOP.  UA with positive nitrites/leukocytes and pyuria but otherwise no signs/symptoms of active UTI.  Hold off on antibiotics.  Communicated fracture of left patella and distal clavicle fracture, POA: Occurring after a fall last month prior to admission 7/10-7/17.  Managed conservatively with knee immobilizer and sling.  Permanent atrial fibrillation S/p PPM: Remains in atrial fibrillation with controlled rate.  Continue Coreg and Eliquis.  Hypertension: BP stable.  Continue Coreg, holding torsemide for now.  CKD stage IIIb: Renal function stable.  Holding torsemide due to hypokalemia.  Cirrhosis: Suspected Nash cirrhosis.  Compensated without evidence of encephalopathy.  Continue lactulose, rifaximin.  Torsemide on hold as above.  Incidental CT finding: Heterogeneous endometrial stripe thickening in the fundal region measuring up to 17 mm noted on CT A/P.  Pelvic ultrasound has been ordered.  Hypothyroidism: Continue Synthroid.  History of gout: Continue allopurinol.   DVT prophylaxis:  apixaban (ELIQUIS) tablet 5 mg  Code Status: Full code, confirmed with patient on admission Family Communication: Son and daughter-in-law at bedside Disposition Plan: From SNF, dispo pending  clinical progress Consults called: None Severity of Illness: The appropriate patient status for this patient is OBSERVATION. Observation status is judged to be reasonable and necessary in order to provide the required intensity of service to ensure the patient's safety. The patient's presenting symptoms, physical exam findings, and initial radiographic and laboratory data in the context of their medical condition is felt to place them at decreased risk for further clinical deterioration. Furthermore, it is anticipated that the patient will be medically stable for discharge from the hospital within 2 midnights of admission.   Darreld Mclean MD [facility]  If 7PM-7AM, please contact night-coverage [email or web address]  06/09/2023, 9:33 PM

## 2023-06-09 NOTE — ED Provider Notes (Signed)
4:10 PM Care assumed from Dr. Theresia Lo.  At time of transfer of care, patient is waiting for results of labs and CT to evaluate abdominal pain for the last 5 days in the setting of no bowel movements after recent traumatic injury and admission.  If workup is reassuring, anticipate discharge home.  6:29 PM Labs began to return and patient does have significant hypokalemia with potassium of 2.6.  Will order IV potassium and will wait for results to return to determine disposition.  7:47 PM CT returned showing large stool burden with possible early stercoral colitis and fecal impaction.  No other obstruction seen.  Also shows some thickening in the uterine fundus and ultrasound was recommended this will be ordered.  Patient reassessed and she is having a bowel movement now.  As she is currently having a bowel movement we will hold on disimpaction attempted by manual disimpaction.  Per chart she was given an enema prior to coming here.  What is concerning is the potassium is so low and now she is having numerous PVCs.  Interpreter to complaints with her but given the low potassium despite taking oral potassium at home, the PVCs, and the abdominal pain with possible early stercoral colitis, do not feel she is safe for discharge home.  Will call for medicine admission for hypokalemia management and abdominal pain with possible stercoral colitis development.  Will discuss with medicine if they would like antibiotics or not.  Clinical Impression: 1. Hypokalemia   2. Frequent PVCs   3. Abdominal pain, unspecified abdominal location   4. Fecal impaction (HCC)     Disposition: Admit  This note was prepared with assistance of Dragon voice recognition software. Occasional wrong-word or sound-a-like substitutions may have occurred due to the inherent limitations of voice recognition software.     , Canary Brim, MD 06/09/23 2149

## 2023-06-09 NOTE — Hospital Course (Signed)
Katelyn Jackson was admitted to the hospital with the working diagnosis of hypokalemia in the setting of severe constipation.    77 y.o. female with medical history significant for permanent atrial fibrillation, s/p PPM, CKD stage IIIb, hepatic cirrhosis, HTN, HLD, hypothyroidism, chronic lymphedema, gout who is admitted with hypokalemia. Recent  hospitalization 07/10 to 05/24/23 for mechanical fall with left patellar and left distal clavicle fracture, complicated with urinary tract infection.  Reported no bowel movement for the last 5 days, progressed to abdominal pain for the last 3 days prior to admission. She had foley cathter placed for urinary retention.  On her initial physical examination her blood pressure was 128/67, HR 63, RR 18 and 02 saturation 99%, lungs no wheezing or rales, heart with S1 and S2 present irregularly irregular, abdomen with no distention but tender to palpation, non pitting lower extremity edema.   Na 140, K 2,7 Cl 100, bicarbonate 29, glucose 100, bun 31 cr 1,49  Wbc 7.7 hgb 11.0 plt 139  Urine analysis SG 1,017, positive nitrates, large leukocytes, >50 wbc, protein 30.   CT abdomen with large volume of stool throughout the colon with stool distended rectum measuring up to 9.0 x 8.3 cm diameter. Positive peri rectal edema and inflammation. Imaging suggesting fecal impaction/ Stercoral colitis not excluded.  Heterogenous endometrial stripe thickening in the fundal region up to 17 mmm.   EKG 70 bpm, left axis deviation, qtc 502, right bundle branch block, atrial fibrillation rhythm with ventricular pacing, no significant ST segment or T wave changes.   Patient had K correction and placed on bowel regimen.

## 2023-06-10 ENCOUNTER — Observation Stay (HOSPITAL_COMMUNITY): Payer: Medicare HMO

## 2023-06-10 DIAGNOSIS — I129 Hypertensive chronic kidney disease with stage 1 through stage 4 chronic kidney disease, or unspecified chronic kidney disease: Secondary | ICD-10-CM | POA: Diagnosis present

## 2023-06-10 DIAGNOSIS — I482 Chronic atrial fibrillation, unspecified: Secondary | ICD-10-CM

## 2023-06-10 DIAGNOSIS — L89893 Pressure ulcer of other site, stage 3: Secondary | ICD-10-CM | POA: Diagnosis present

## 2023-06-10 DIAGNOSIS — K7460 Unspecified cirrhosis of liver: Secondary | ICD-10-CM

## 2023-06-10 DIAGNOSIS — E860 Dehydration: Secondary | ICD-10-CM | POA: Diagnosis present

## 2023-06-10 DIAGNOSIS — L89152 Pressure ulcer of sacral region, stage 2: Secondary | ICD-10-CM | POA: Diagnosis present

## 2023-06-10 DIAGNOSIS — W19XXXD Unspecified fall, subsequent encounter: Secondary | ICD-10-CM | POA: Diagnosis present

## 2023-06-10 DIAGNOSIS — N1832 Chronic kidney disease, stage 3b: Secondary | ICD-10-CM | POA: Diagnosis present

## 2023-06-10 DIAGNOSIS — Z91048 Other nonmedicinal substance allergy status: Secondary | ICD-10-CM | POA: Diagnosis not present

## 2023-06-10 DIAGNOSIS — K449 Diaphragmatic hernia without obstruction or gangrene: Secondary | ICD-10-CM | POA: Diagnosis not present

## 2023-06-10 DIAGNOSIS — M1A9XX0 Chronic gout, unspecified, without tophus (tophi): Secondary | ICD-10-CM | POA: Diagnosis present

## 2023-06-10 DIAGNOSIS — I272 Pulmonary hypertension, unspecified: Secondary | ICD-10-CM | POA: Diagnosis present

## 2023-06-10 DIAGNOSIS — N139 Obstructive and reflux uropathy, unspecified: Secondary | ICD-10-CM | POA: Diagnosis present

## 2023-06-10 DIAGNOSIS — E039 Hypothyroidism, unspecified: Secondary | ICD-10-CM | POA: Diagnosis present

## 2023-06-10 DIAGNOSIS — E876 Hypokalemia: Secondary | ICD-10-CM | POA: Diagnosis present

## 2023-06-10 DIAGNOSIS — I959 Hypotension, unspecified: Secondary | ICD-10-CM | POA: Diagnosis present

## 2023-06-10 DIAGNOSIS — I890 Lymphedema, not elsewhere classified: Secondary | ICD-10-CM | POA: Diagnosis present

## 2023-06-10 DIAGNOSIS — R9389 Abnormal findings on diagnostic imaging of other specified body structures: Secondary | ICD-10-CM | POA: Diagnosis not present

## 2023-06-10 DIAGNOSIS — D252 Subserosal leiomyoma of uterus: Secondary | ICD-10-CM | POA: Diagnosis not present

## 2023-06-10 DIAGNOSIS — Z79890 Hormone replacement therapy (postmenopausal): Secondary | ICD-10-CM | POA: Diagnosis not present

## 2023-06-10 DIAGNOSIS — N858 Other specified noninflammatory disorders of uterus: Secondary | ICD-10-CM | POA: Diagnosis not present

## 2023-06-10 DIAGNOSIS — I4821 Permanent atrial fibrillation: Secondary | ICD-10-CM | POA: Diagnosis present

## 2023-06-10 DIAGNOSIS — E669 Obesity, unspecified: Secondary | ICD-10-CM | POA: Diagnosis present

## 2023-06-10 DIAGNOSIS — R935 Abnormal findings on diagnostic imaging of other abdominal regions, including retroperitoneum: Secondary | ICD-10-CM | POA: Diagnosis not present

## 2023-06-10 DIAGNOSIS — Z950 Presence of cardiac pacemaker: Secondary | ICD-10-CM | POA: Diagnosis not present

## 2023-06-10 DIAGNOSIS — K5641 Fecal impaction: Secondary | ICD-10-CM | POA: Diagnosis present

## 2023-06-10 DIAGNOSIS — S42002A Fracture of unspecified part of left clavicle, initial encounter for closed fracture: Secondary | ICD-10-CM | POA: Diagnosis not present

## 2023-06-10 DIAGNOSIS — Z923 Personal history of irradiation: Secondary | ICD-10-CM | POA: Diagnosis not present

## 2023-06-10 DIAGNOSIS — Z880 Allergy status to penicillin: Secondary | ICD-10-CM | POA: Diagnosis not present

## 2023-06-10 DIAGNOSIS — E785 Hyperlipidemia, unspecified: Secondary | ICD-10-CM | POA: Diagnosis present

## 2023-06-10 DIAGNOSIS — K529 Noninfective gastroenteritis and colitis, unspecified: Secondary | ICD-10-CM | POA: Diagnosis not present

## 2023-06-10 DIAGNOSIS — Z6838 Body mass index (BMI) 38.0-38.9, adult: Secondary | ICD-10-CM | POA: Diagnosis not present

## 2023-06-10 DIAGNOSIS — K5289 Other specified noninfective gastroenteritis and colitis: Secondary | ICD-10-CM | POA: Diagnosis present

## 2023-06-10 DIAGNOSIS — R109 Unspecified abdominal pain: Secondary | ICD-10-CM | POA: Diagnosis not present

## 2023-06-10 DIAGNOSIS — K5900 Constipation, unspecified: Secondary | ICD-10-CM | POA: Diagnosis not present

## 2023-06-10 DIAGNOSIS — Z978 Presence of other specified devices: Secondary | ICD-10-CM | POA: Diagnosis not present

## 2023-06-10 MED ORDER — SODIUM CHLORIDE 0.9 % IV SOLN: INTRAVENOUS | Status: DC

## 2023-06-10 MED ORDER — BISACODYL 5 MG PO TBEC
5.0000 mg | DELAYED_RELEASE_TABLET | Freq: Once | ORAL | Status: AC
  Administered 2023-06-10: 5 mg via ORAL
  Filled 2023-06-10: qty 1

## 2023-06-10 MED ORDER — POLYETHYLENE GLYCOL 3350 17 G PO PACK
17.0000 g | PACK | Freq: Two times a day (BID) | ORAL | Status: DC
  Administered 2023-06-10 – 2023-06-14 (×8): 17 g via ORAL
  Filled 2023-06-10 (×9): qty 1

## 2023-06-10 NOTE — Assessment & Plan Note (Signed)
Continue with levothyroxine  

## 2023-06-10 NOTE — Assessment & Plan Note (Signed)
Left patellar fracture.  Continue pain control, PT and OT

## 2023-06-10 NOTE — Assessment & Plan Note (Addendum)
Sever constipation Stercoral colitis   Patient had 3 small liquid stools today.  Plan to continue with miralax bid, add one dose 10 mg bisacodyl. Hold on lactulose for now.   Out of bed as tolerated PT and OT.

## 2023-06-10 NOTE — Assessment & Plan Note (Addendum)
Hypokalemia.   Renal function with serum cr 1,63 with K at 3,3 and serum bicarbonate at 29, Na 136.   Continue with IV fluids NS at 50 ml per hr Add 40 meq Kcl x2 Follow up renal function in am.

## 2023-06-10 NOTE — Assessment & Plan Note (Addendum)
Continue foley catheter for urinary retention.   Heterogeneous endometrial stripe thickening in the fundal region measuring up to 17 mm noted on CT A/P.  Pelvic ultrasound has been ordered.   Pelvic US with fluid filled endometrium with echogenic lesion with arterial and venous wave forms, possible polyp.

## 2023-06-10 NOTE — Assessment & Plan Note (Addendum)
NASH.  No clinical signs of decompensation. Plan to continue rifaximine.

## 2023-06-10 NOTE — Plan of Care (Signed)
  Problem: Education: Goal: Knowledge of General Education information will improve Description: Including pain rating scale, medication(s)/side effects and non-pharmacologic comfort measures Outcome: Progressing   Problem: Clinical Measurements: Goal: Ability to maintain clinical measurements within normal limits will improve Outcome: Progressing Goal: Will remain free from infection Outcome: Progressing   Problem: Activity: Goal: Risk for activity intolerance will decrease Outcome: Progressing   Problem: Nutrition: Goal: Adequate nutrition will be maintained Outcome: Progressing   Problem: Coping: Goal: Level of anxiety will decrease Outcome: Progressing   Problem: Elimination: Goal: Will not experience complications related to bowel motility Outcome: Progressing   Problem: Pain Managment: Goal: General experience of comfort will improve Outcome: Progressing

## 2023-06-10 NOTE — Progress Notes (Addendum)
  Progress Note   Patient: Katelyn Jackson DGU:[id number] DOB: 1946/08/23 DOA: 06/09/2023     0 DOS: the patient was seen and examined on 06/10/2023   Brief hospital course: Katelyn Jackson is a 77 y.o. female with medical history significant for permanent atrial fibrillation on Eliquis, s/p PPM, CKD stage IIIb, hepatic cirrhosis, HTN, HLD, hypothyroidism, chronic lymphedema, gout who is admitted with hypokalemia. Recent  hospitalization 07/10 to 05/24/23 for mechanical fall with left patellar and left distal clavicle fracture, complicated with urinary tract infection.  Reported no bowel movement for the last 5 days, progressed to abdominal pain for the last 3 days prior to admission. She had foley cathter placed for urinary retention.   Assessment and Plan: * Chronic kidney disease, stage 3b (HCC) Hypokalemia.   Renal function today with serum cr at 1,62 with K at 3,5 and serum bicarbonate at 28. Na 137.  Plan to add IV fluids NS at 75 ml per hr Follow up renal function in am.    Constipation Per nursing patient had bowel movement last night.  Plan to continue bowel regimen with miralax, lactulose and  bisacodyl.   Out of bed as tolerated PT and OT.   Chronic atrial fibrillation (HCC) Continue rate control with carvedilol and anticoagulation with apixaban.   Hypothyroidism Continue with levothyroxine.   Liver cirrhosis (HCC) NASH.  No clinical signs of decompensation. Plan to continue lactulose and rifaximine.  Clavicle fracture Left patellar fracture.  Continue pain control, PT and OT   Foley catheter in place prior to arrival Continue foley catheter for urinary retention.   Heterogeneous endometrial stripe thickening in the fundal region measuring up to 17 mm noted on CT A/P.  Pelvic ultrasound has been ordered.         Subjective: Patient continue to have abdominal pain and complains of constipation, no nausea or vomiting.   Physical Exam: Vitals:    06/09/23 2225 06/09/23 2345 06/10/23 0442 06/10/23 0828  BP: (!) 112/55 (!) 97/45 128/65 (!) 126/59  Pulse: 64 68 63 64  Resp: 18 18 18 17   Temp: 99 F (37.2 C) 98.8 F (37.1 C) 98.9 F (37.2 C) 98.8 F (37.1 C)  TempSrc: Oral Oral Oral Oral  SpO2: 100% 100% 100% 97%  Weight:      Height:       Neurology awake and alert ENT with mild pallor Cardiovascular with S1 and S2 present irregularly irregular with no gallops, or rubs, positive systolic murmur at the apex Respiratory with no rales or wheezing, no rhonchi Abdomen with no distention, soft but tender to palpation, no guarding or rebound.  Data Reviewed:    Family Communication: no family at the bedside   Disposition: Status is: Observation The patient remains OBS appropriate and will d/c before 2 midnights.  Planned Discharge Destination: Skilled nursing facility    Author: Coralie Keens, MD 06/10/2023 9:44 AM  For on call review [email or web address].

## 2023-06-10 NOTE — Assessment & Plan Note (Signed)
Continue rate control with carvedilol and anticoagulation with apixaban.

## 2023-06-11 DIAGNOSIS — K5900 Constipation, unspecified: Secondary | ICD-10-CM

## 2023-06-11 DIAGNOSIS — E860 Dehydration: Secondary | ICD-10-CM

## 2023-06-11 DIAGNOSIS — S42002A Fracture of unspecified part of left clavicle, initial encounter for closed fracture: Secondary | ICD-10-CM | POA: Diagnosis not present

## 2023-06-11 DIAGNOSIS — E039 Hypothyroidism, unspecified: Secondary | ICD-10-CM

## 2023-06-11 DIAGNOSIS — N1832 Chronic kidney disease, stage 3b: Secondary | ICD-10-CM

## 2023-06-11 DIAGNOSIS — Z978 Presence of other specified devices: Secondary | ICD-10-CM

## 2023-06-11 LAB — BASIC METABOLIC PANEL WITH GFR
Anion gap: 7 (ref 5–15)
BUN: 32 mg/dL — ABNORMAL HIGH (ref 8–23)
CO2: 29 mmol/L (ref 22–32)
Calcium: 9.5 mg/dL (ref 8.9–10.3)
Chloride: 100 mmol/L (ref 98–111)
Creatinine, Ser: 1.63 mg/dL — ABNORMAL HIGH (ref 0.44–1.00)
GFR, Estimated: 32 mL/min — ABNORMAL LOW (ref >60)
Glucose, Bld: 101 mg/dL — ABNORMAL HIGH (ref 70–99)
Potassium: 3.3 mmol/L — ABNORMAL LOW (ref 3.5–5.1)
Sodium: 136 mmol/L (ref 135–145)

## 2023-06-11 MED ORDER — BISACODYL 10 MG RE SUPP
10.0000 mg | Freq: Once | RECTAL | Status: AC
  Administered 2023-06-11: 10 mg via RECTAL
  Filled 2023-06-11: qty 1

## 2023-06-11 MED ORDER — POTASSIUM CHLORIDE CRYS ER 20 MEQ PO TBCR
40.0000 meq | EXTENDED_RELEASE_TABLET | Freq: Once | ORAL | Status: AC
  Administered 2023-06-11: 40 meq via ORAL
  Filled 2023-06-11: qty 2

## 2023-06-11 MED ORDER — TRAMADOL HCL 50 MG PO TABS
25.0000 mg | ORAL_TABLET | Freq: Once | ORAL | Status: AC
  Administered 2023-06-11: 25 mg via ORAL
  Filled 2023-06-11: qty 1

## 2023-06-11 NOTE — Evaluation (Signed)
Physical Therapy Evaluation Patient Details Name: Katelyn Jackson MRN: [id number] DOB: 1945-12-21 Today's Date: 06/11/2023  History of Present Illness  The pt is a 77 yo female presenting [name] with constipation. PMH includes: afib on Eliquis s/p PPM, hypothyroidism, CKD III, hepatic cirrhosis, HTN, HLD, chronic lymphedema, gout, and recent admission after a fall with L distal clavicle fx and L patella fx.   Clinical Impression  Pt in bed upon arrival of PT, agreeable to evaluation at this time. Prior to admission the pt was mobilizing with assist from staff at rehab, reports she had not yet returned to being able to stand without assist. The pt was limited in all mobility today but NWB of L-sided extremities as well as poor functional ROM and strength and RUE. The pt needed modA to complete bed mobility and maxA to attempt sit-stand with assist at gait belt and RUE. The pt was only able to tolerate for 3 seconds, needed seated rest and was not able to attempt multiple times. She was able to complete intermittent lateral scoots along EOB with mod-maxA, will continue to benefit from skilled PT acutely to increase mobility and continue RLE strengthening. Will need return to inpatient rehab <3 hours/day when medically cleared to continue with longer-term rehab needs.      If plan is discharge home, recommend the following: Two people to help with walking and/or transfers;Two people to help with bathing/dressing/bathroom;Assistance with cooking/housework;Direct supervision/assist for medications management;Assistance with feeding;Direct supervision/assist for financial management;Assist for transportation;Help with stairs or ramp for entrance   Can travel by private vehicle   No    Equipment Recommendations Hospital bed (hoyer lift)  Recommendations for Other Services       Functional Status Assessment Patient has had a recent decline in their functional status and  demonstrates the ability to make significant improvements in function in a reasonable and predictable amount of time.     Precautions / Restrictions Precautions Precautions: Fall Required Braces or Orthoses: Knee Immobilizer - Left;Sling Knee Immobilizer - Left: On at all times Restrictions Weight Bearing Restrictions: Yes RUE Weight Bearing: Weight bearing as tolerated LUE Weight Bearing: Non weight bearing LLE Weight Bearing: Non weight bearing      Mobility  Bed Mobility Overal bed mobility: Needs Assistance Bed Mobility: Rolling, Sidelying to Sit, Sit to Supine Rolling: Mod assist Sidelying to sit: Mod assist   Sit to supine: Max assist   General bed mobility comments: assist to roll and pull to sit, limited function of RUE, assist from therapist to elevate trunk and complete scooting to EOB. maxA to return to supine.    Transfers Overall transfer level: Needs assistance   Transfers: Sit to/from Stand, Bed to chair/wheelchair/BSC Sit to Stand: Max assist          Lateral/Scoot Transfers: Max assist, Mod assist General transfer comment: maxA to attempt stand, limited to ~3 seconds prior to need to sit. pt unable to complete again due to pain. attempted lateral scoot with maxA using bed pad, at times pt completing with good hip clearance and modA    Ambulation/Gait               General Gait Details: pt unable due to NWB LUE, LLE, and poor functional strength in RUE    Balance Overall balance assessment: Needs assistance Sitting-balance support: Single extremity supported, Feet supported Sitting balance-Leahy Scale: Poor     Standing balance support: Single extremity supported Standing balance-Leahy Scale: Poor Standing balance comment:  maxA in SLS on RLE, assist to weight shift to R at trunk                             Pertinent Vitals/Pain Pain Assessment Pain Assessment: Faces Faces Pain Scale: Hurts whole lot Pain Location: L knee,  stomach Pain Descriptors / Indicators: Grimacing Pain Intervention(s): Limited activity within patient's tolerance, Monitored during session, Repositioned, Patient requesting pain meds-RN notified    Home Living Family/patient expects to be discharged to:: Skilled nursing facility                   Additional Comments: pt has been at SNF for 5 days, plans to return and continue rehab    Prior Function Prior Level of Function : Needs assist;History of Falls (last six months)             Mobility Comments: pt assisted by staff, using WC, working on standing at SNF ADLs Comments: pt assisted by staff     Hand Dominance   Dominant Hand: Right    Extremity/Trunk Assessment   Upper Extremity Assessment Upper Extremity Assessment: Defer to OT evaluation;LUE deficits/detail;RUE deficits/detail RUE Deficits / Details: RTC tears, limited flexion/ABD, unable to use RUE for feeding/UB ADLs RUE Coordination: decreased gross motor LUE Deficits / Details: NWB LUE, limited shoulder ROM due to pain LUE: Unable to fully assess due to immobilization LUE Coordination: decreased gross motor    Lower Extremity Assessment Lower Extremity Assessment: Generalized weakness;LLE deficits/detail;RLE deficits/detail RLE Deficits / Details: Strength grossly 4/5, ROM limited by body habitus RLE Sensation: WNL RLE Coordination: decreased fine motor LLE Deficits / Details: limited due to immobilization and NWB. pt with redness and moisutre in folds behind knee. LLE: Unable to fully assess due to pain;Unable to fully assess due to immobilization LLE Sensation: WNL    Cervical / Trunk Assessment Cervical / Trunk Assessment: Other exceptions Cervical / Trunk Exceptions: increased body habitus  Communication   Communication: Prefers language other than [place] ([place], used Ipad interpreter)  Cognition Arousal/Alertness: Awake/alert Behavior During Therapy: WFL for tasks  assessed/performed Overall Cognitive Status: Within Functional Limits for tasks assessed                                 General Comments: pt with poor understanding of bowel condition, stated to me "I haven't gone in 5 days and could die from it" when it is charted that the pt has had a few small bowel movements. Attempted to re-educate patient        General Comments General comments (skin integrity, edema, etc.): redness and moisture in folds of L knee, RN alerted.        Assessment/Plan    PT Assessment Patient needs continued PT services  PT Problem List Decreased strength;Decreased range of motion;Decreased activity tolerance;Decreased balance;Decreased mobility;Decreased coordination;Obesity;Decreased skin integrity;Pain       PT Treatment Interventions DME instruction;Functional mobility training;Therapeutic activities;Therapeutic exercise;Balance training;Cognitive remediation;Patient/family education;Wheelchair mobility training    PT Goals (Current goals can be found in the Care Plan section)  Acute Rehab PT Goals Patient Stated Goal: to return to rehab, have a bowel movement PT Goal Formulation: With patient Time For Goal Achievement: 06/25/23 Potential to Achieve Goals: Fair    Frequency Min 1X/week        AM-PAC PT "6 Clicks" Mobility  Outcome Measure Help needed turning from your  back to your side while in a flat bed without using bedrails?: A Little Help needed moving from lying on your back to sitting on the side of a flat bed without using bedrails?: A Lot Help needed moving to and from a bed to a chair (including a wheelchair)?: A Lot Help needed standing up from a chair using your arms (e.g., wheelchair or bedside chair)?: Total Help needed to walk in hospital room?: Total Help needed climbing 3-5 steps with a railing? : Total 6 Click Score: 10    End of Session Equipment Utilized During Treatment: Gait belt;Left knee  immobilizer Activity Tolerance: Patient tolerated treatment well;Patient limited by pain Patient left: in bed;with call bell/phone within reach;with bed alarm set Nurse Communication: Mobility status;Other (comment) (skin breakdown behind L knee) PT Visit Diagnosis: Unsteadiness on feet (R26.81);Muscle weakness (generalized) (M62.81);Pain Pain - Right/Left: Left Pain - part of body: Shoulder;Knee    Time: 1111-1150 PT Time Calculation (min) (ACUTE ONLY): 39 min   Charges:   PT Evaluation $PT Eval Low Complexity: 1 Low PT Treatments $Therapeutic Exercise: 8-22 mins $Therapeutic Activity: 8-22 mins PT General Charges $$ ACUTE PT VISIT: 1 Visit         Vickki Muff, PT, DPT   Acute Rehabilitation Department Office [phone] Secure Chat Communication Preferred  Ronnie Derby 06/11/2023, 1:15 PM

## 2023-06-11 NOTE — Progress Notes (Signed)
Progress Note   Patient: Katelyn Jackson ZOX:[id number] DOB: 05/06/1946 DOA: 06/09/2023     1 DOS: the patient was seen and examined on 06/11/2023   Brief hospital course: Katelyn Jackson was admitted to the hospital with the working diagnosis of hypokalemia in the setting of severe constipation.    77 y.o. female with medical history significant for permanent atrial fibrillation, s/p PPM, CKD stage IIIb, hepatic cirrhosis, HTN, HLD, hypothyroidism, chronic lymphedema, gout who is admitted with hypokalemia. Recent  hospitalization 07/10 to 05/24/23 for mechanical fall with left patellar and left distal clavicle fracture, complicated with urinary tract infection.  Reported no bowel movement for the last 5 days, progressed to abdominal pain for the last 3 days prior to admission. She had foley cathter placed for urinary retention.  On her initial physical examination her blood pressure was 128/67, HR 63, RR 18 and 02 saturation 99%, lungs no wheezing or rales, heart with S1 and S2 present irregularly irregular, abdomen with no distention but tender to palpation, non pitting lower extremity edema.   Na 140, K 2,7 Cl 100, bicarbonate 29, glucose 100, bun 31 cr 1,49  Wbc 7.7 hgb 11.0 plt 139  Urine analysis SG 1,017, positive nitrates, large leukocytes, >50 wbc, protein 30.   CT abdomen with large volume of stool throughout the colon with stool distended rectum measuring up to 9.0 x 8.3 cm diameter. Positive peri rectal edema and inflammation. Imaging suggesting fecal impaction/ Stercoral colitis not excluded.  Heterogenous endometrial stripe thickening in the fundal region up to 17 mmm.   EKG 70 bpm, left axis deviation, qtc 502, right bundle branch block, atrial fibrillation rhythm with ventricular pacing, no significant ST segment or T wave changes.   Patient had K correction and placed on bowel regimen.   Assessment and Plan: * Chronic kidney disease, stage 3b (HCC) Hypokalemia.   Renal  function with serum cr 1,63 with K at 3,3 and serum bicarbonate at 29, Na 136.   Continue with IV fluids NS at 50 ml per hr Add 40 meq Kcl x2 Follow up renal function in am.    Constipation Sever constipation Stercoral colitis   Patient had 3 small liquid stools today.  Plan to continue with miralax bid, add one dose 10 mg bisacodyl. Hold on lactulose for now.   Out of bed as tolerated PT and OT.   Chronic atrial fibrillation (HCC) Continue rate control with carvedilol and anticoagulation with apixaban.   Hypothyroidism Continue with levothyroxine.   Liver cirrhosis (HCC) NASH.  No clinical signs of decompensation. Plan to continue lactulose and rifaximine.  Clavicle fracture Left patellar fracture.  Continue pain control, PT and OT   Foley catheter in place prior to arrival Continue foley catheter for urinary retention.   Heterogeneous endometrial stripe thickening in the fundal region measuring up to 17 mm noted on CT A/P.  Pelvic ultrasound has been ordered.   Pelvic US with fluid filled endometrium with echogenic lesion with arterial and venous wave forms, possible polyp.         Subjective: Patient had 3 loose stools this am, small, she continue to have abdominal pain, colicky in nature, with some nausea but not vomiting.   Physical Exam: Vitals:   06/11/23 0533 06/11/23 0751 06/11/23 1139 06/11/23 1200  BP: (!) 114/53 (!) 89/40 138/69 138/69  Pulse:  65 66 66  Resp: (!) 23  18   Temp:  99.1 F (37.3 C) 97.6 F (36.4 C) 97.6 F (36.4 C)  TempSrc:  Oral    SpO2:  96% 97% 98%  Weight:      Height:       Neurology awake and alert ENT with mild pallor Cardiovascular with S1 and S2 present and regular with no gallops. Positive systolic murmur at the right lower sternal border Respiratory with no rales or wheezing Abdomen with no distention, tender to deep palpation with no rebound or guarding.  Non pitting lower extremity edema  Data  Reviewed:    Family Communication: I spoke with patient's son over the phone at the bedside, we talked in detail about patient's condition, plan of care and prognosis and all questions were addressed.   Disposition: Status is: Inpatient Remains inpatient appropriate because: hypokalemia, constipation   Planned Discharge Destination: Skilled nursing facility     Author: Coralie Keens, MD 06/11/2023 1:55 PM  For on call review [email or web address].

## 2023-06-11 NOTE — Plan of Care (Signed)

## 2023-06-12 ENCOUNTER — Ambulatory Visit: Payer: Medicare HMO

## 2023-06-12 DIAGNOSIS — E039 Hypothyroidism, unspecified: Secondary | ICD-10-CM | POA: Diagnosis not present

## 2023-06-12 DIAGNOSIS — E669 Obesity, unspecified: Secondary | ICD-10-CM | POA: Insufficient documentation

## 2023-06-12 DIAGNOSIS — K5900 Constipation, unspecified: Secondary | ICD-10-CM | POA: Diagnosis not present

## 2023-06-12 DIAGNOSIS — E66812 Obesity, class 2: Secondary | ICD-10-CM | POA: Insufficient documentation

## 2023-06-12 DIAGNOSIS — S42002A Fracture of unspecified part of left clavicle, initial encounter for closed fracture: Secondary | ICD-10-CM | POA: Diagnosis not present

## 2023-06-12 DIAGNOSIS — N1832 Chronic kidney disease, stage 3b: Secondary | ICD-10-CM | POA: Diagnosis not present

## 2023-06-12 MED ORDER — SODIUM CHLORIDE 0.9 % IV BOLUS
500.0000 mL | Freq: Once | INTRAVENOUS | Status: AC
  Administered 2023-06-12: 500 mL via INTRAVENOUS

## 2023-06-12 NOTE — Progress Notes (Signed)
   06/12/23 0817  Spiritual Encounters  Type of Visit Attempt (pt unavailable)  Reason for visit Code (Rapid Response)  OnCall Visit Yes   Chaplain responded to rapid response. The patient was unavailable. However, nursing staff requested a chaplain to come by later to talk with the patient. An interpreter will be needed.   Arlyce Dice, Chaplain Resident [phone]

## 2023-06-12 NOTE — TOC Initial Note (Signed)
Transition of Care [facility]) - Initial/Assessment Note    Patient Details  Name: Katelyn Jackson MRN: [id number] Date of Birth: 1946/08/26  Transition of Care [facility]) CM/SW Contact:    Lorri Frederick, LCSW Phone Number: 06/12/2023, 4:12 PM  Clinical Narrative:       Pt able to speak some english, confirms she was at [facility] SNF, permission given to speak with son [name].  CSW spoke with Jeb Levering, who does want pt to return to [employer] at DC, has only been there about a week.  Normally from home with son.  CSW spoke with Soy/Shannon Wallace Cullens, who confirms pt can return, pt does need new insurance auth.  Referral sent to [name]. Auth request submitted in [place].              Expected Discharge Plan: Skilled Nursing Facility Barriers to Discharge: Continued Medical Work up, Other (must enter comment) (insurance auth)   Patient Goals and CMS Choice     Choice offered to / list presented to : Adult Children (son Ambulance person)      Expected Discharge Plan and Services In-house Referral: Clinical Social Work   Post Acute Care Choice: Skilled Nursing Facility Living arrangements for the past 2 months: Single Family Home                                      Prior Living Arrangements/Services Living arrangements for the past 2 months: Single Family Home Lives with:: Adult Children Patient language and need for interpreter reviewed:: Yes (pt speaks some english)        Need for Family Participation in Patient Care: Yes (Comment) Care giver support system in place?: Yes (comment) Current home services: Other (comment) (none) Criminal Activity/Legal Involvement Pertinent to Current Situation/Hospitalization: No - Comment as needed  Activities of Daily Living      Permission Sought/Granted Permission sought to share information with : Family Supports Permission granted to share information with : Yes, Verbal Permission Granted  Share Information with NAME: son [name]            Emotional Assessment Appearance:: Appears stated age Attitude/Demeanor/Rapport: Engaged Affect (typically observed): Pleasant Orientation: : Oriented to Self, Oriented to Place, Oriented to  Time, Oriented to Situation      Admission diagnosis:  Hypokalemia [E87.6] Fecal impaction (HCC) [K56.41] Frequent PVCs [I49.3] Abdominal pain, unspecified abdominal location [R10.9] Dehydration [E86.0] Patient Active Problem List   Diagnosis Date Noted   Dehydration 06/10/2023   Constipation 06/09/2023   Foley catheter in place prior to arrival 06/09/2023   Increased endometrial stripe thickness 06/09/2023   Closed patellar sleeve fracture of left knee 05/20/2023   Fall 05/18/2023   Patella fracture 05/18/2023   Clavicle fracture 05/18/2023   Abnormal urinalysis 05/18/2023   Chronic kidney disease, stage 3b (HCC) 05/18/2023   History of breast cancer 05/18/2023   Intertrigo 04/17/2023   Skin lesion 03/08/2023   Chronic heart failure with preserved ejection fraction (HCC) 12/16/2022   Callus 11/04/2022   Cardiac pacemaker in situ 12/10/2021   Morbid obesity with BMI of 45.0-49.9, adult (HCC) 12/10/2021   Breast lesion 12/07/2021   Dry skin 09/22/2021   Colonization with VRE (vancomycin-resistant enterococcus) 09/22/2021   Osteoarthritis of right knee 08/19/2021   Herpes labialis 08/19/2021   Liver cirrhosis (HCC) 07/22/2021   Abscess    Prosthetic joint infection (HCC)    Abscess of right shoulder  07/21/2021   History of pacemaker 07/21/2021   Pressure injury of skin 07/03/2021   Hypothyroidism    Cellulitis    Postoperative anemia due to acute blood loss 03/19/2021   Dyspnea 03/19/2021   Symptomatic anemia 03/19/2021   Status post right shoulder hemiarthroplasty 03/17/2021   Bradycardia 01/05/2021   CAP (community acquired pneumonia) 01/05/2021   CKD (chronic kidney disease) stage 4, GFR 15-29 ml/min (HCC) 01/05/2021   Bilateral pain of leg and foot 10/08/2020    Abscess of left thumb 06/17/2020   Cellulitis and abscess of left leg 06/17/2020   Herpes zoster 02/18/2020   GERD (gastroesophageal reflux disease) 11/25/2019   Wheezing 10/26/2017   Sciatica of left side 08/29/2017   Morbid obesity (HCC) 07/11/2017   Hyperglycemia 07/11/2017   Low back pain 06/19/2017   Acute renal failure superimposed on stage 3b chronic kidney disease (HCC) 06/19/2017   Hypokalemia 06/19/2017   Acute lower UTI 06/19/2017   Anemia of chronic disease 02/16/2017   Malignant neoplasm of upper-outer quadrant of left breast in female, estrogen receptor positive (HCC) 02/10/2017   Essential hypertension 04/05/2016   Dyslipidemia 04/05/2016   Chronic atrial fibrillation (HCC) 04/05/2016   Pulmonary hypertension (HCC) 04/05/2016   Lymphedema 04/05/2016   PCP:  Tresa Garter, MD Pharmacy:   [facility] [place], [place] - [address] [place] [address] Phone: [phone] Fax: [phone]     Social Determinants of Health (SDOH) Social History: SDOH Screenings   Food Insecurity: No Food Insecurity (05/18/2023)  Housing: Patient Unable To Answer (05/18/2023)  Transportation Needs: No Transportation Needs (05/18/2023)  Utilities: Not At Risk (05/18/2023)  Alcohol Screen: Low Risk  (06/15/2022)  Depression (PHQ2-9): Low Risk  (04/17/2023)  Financial Resource Strain: Low Risk  (06/15/2022)  Physical Activity: Inactive (06/15/2022)  Social Connections: Socially Isolated (06/15/2022)  Stress: No Stress Concern Present (06/15/2022)  Tobacco Use: Low Risk  (06/09/2023)   SDOH Interventions:     Readmission Risk Interventions    07/06/2021   11:49 AM 03/19/2021    3:03 PM  Readmission Risk Prevention Plan  Transportation Screening Complete Complete  HRI or Home Care Consult  Complete  Social Work Consult for Recovery Care Planning/Counseling  Complete  Palliative Care Screening  Not Applicable  Medication Review Oceanographer)   Complete  HRI or Home Care Consult Complete   SW Recovery Care/Counseling Consult Complete   Palliative Care Screening Not Applicable   Skilled Nursing Facility Complete

## 2023-06-12 NOTE — Progress Notes (Signed)
OT Screen Note  Patient Details Name: Katelyn Jackson MRN: [id number] DOB: 1946-08-08   Cancelled Treatment:    Reason Eval/Treat Not Completed: OT screened, no needs identified, will sign off (Pt from SNF, going back to SNF. Has assist with ADLs due to LUE/LLE limitations. OT signing off on patient at this time due to decreased likelihood of improving patient's functional performance in ADLs, please reconsult OT if needed)  06/12/2023  AB, OTR/L  Acute Rehabilitation Services  Office: [phone]   Tristan Schroeder 06/12/2023, 3:06 PM

## 2023-06-12 NOTE — Progress Notes (Signed)
   06/12/23 1053  Spiritual Encounters  Type of Visit Initial  Care provided to: Patient  Conversation partners present during encounter Physician  Referral source Physician  Reason for visit Routine spiritual support  OnCall Visit No  Spiritual Framework  Presenting Themes Courage hope and growth  Community/Connection Family  Patient Stress Factors Exhausted  Family Stress Factors None identified  Interventions  Spiritual Care Interventions Made Compassionate presence;Narrative/life review  Intervention Outcomes  Outcomes Awareness of support;Reduced anxiety   Chaplain responded to a request to visit Pt. Pt shared she still not feel well and is tired. Chaplain and Pt had a conversation about Pt's life in [place]. 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 Pt looks much more excited as she recalls these memories. Pt was grateful for having this opportunity to share with Chaplain.

## 2023-06-12 NOTE — Assessment & Plan Note (Signed)
Calculated BMI is 38.2

## 2023-06-12 NOTE — Progress Notes (Addendum)
Progress Note   Patient: Katelyn Jackson VWU:[id number] DOB: 06-18-1946 DOA: 06/09/2023     2 DOS: the patient was seen and examined on 06/12/2023   Brief hospital course: Mrs. Gille was admitted to the hospital with the working diagnosis of hypokalemia in the setting of severe constipation.    77 y.o. female with medical history significant for permanent atrial fibrillation, s/p PPM, CKD stage IIIb, hepatic cirrhosis, HTN, HLD, hypothyroidism, chronic lymphedema, gout who is admitted with hypokalemia. Recent  hospitalization 07/10 to 05/24/23 for mechanical fall with left patellar and left distal clavicle fracture, complicated with urinary tract infection.  Reported no bowel movement for the last 5 days, progressed to abdominal pain for the last 3 days prior to admission. She had foley cathter placed for urinary retention.  On her initial physical examination her blood pressure was 128/67, HR 63, RR 18 and 02 saturation 99%, lungs no wheezing or rales, heart with S1 and S2 present irregularly irregular, abdomen with no distention but tender to palpation, non pitting lower extremity edema.   Na 140, K 2,7 Cl 100, bicarbonate 29, glucose 100, bun 31 cr 1,49  Wbc 7.7 hgb 11.0 plt 139  Urine analysis SG 1,017, positive nitrates, large leukocytes, >50 wbc, protein 30.   CT abdomen with large volume of stool throughout the colon with stool distended rectum measuring up to 9.0 x 8.3 cm diameter. Positive peri rectal edema and inflammation. Imaging suggesting fecal impaction/ Stercoral colitis not excluded.  Heterogenous endometrial stripe thickening in the fundal region up to 17 mmm.   EKG 70 bpm, left axis deviation, qtc 502, right bundle branch block, atrial fibrillation rhythm with ventricular pacing, no significant ST segment or T wave changes.   Patient had K correction and placed on bowel regimen.   Assessment and Plan: * Chronic kidney disease, stage 3b (HCC) Hypokalemia.   Renal  function continue to be stable with serum cr at 1,48, her K is up to 4,2 and serum bicarbonate at 27. Plan to continue saline at 50 ml per hr.  Will give an extra bolus 500 cc for hypotension.  Follow up renal function in am.    Constipation Sever constipation Stercoral colitis   Large bowel movement. Continue to have pain in her abdomen but no peritoneal signs.  Continue analgesics, if worsening pain may need repeat CT abdomen and pelvis.   Out of bed as tolerated PT and OT.   Chronic atrial fibrillation (HCC) Continue rate control with carvedilol and anticoagulation with apixaban.   Hypothyroidism Continue with levothyroxine.   Liver cirrhosis (HCC) NASH.  No clinical signs of decompensation. Plan to continue rifaximine.  Clavicle fracture Left patellar fracture.  Continue pain control, PT and OT   Foley catheter in place prior to arrival Continue foley catheter for urinary retention.   Heterogeneous endometrial stripe thickening in the fundal region measuring up to 17 mm noted on CT A/P.  Pelvic ultrasound has been ordered.   Pelvic US with fluid filled endometrium with echogenic lesion with arterial and venous wave forms, possible polyp.   Class 2 obesity Calculated BMI is 38.2   Pressure ulcer.  Thigh and sacrum stage 3 and 2. Present on admission.      Subjective: Patient continue to have abdominal pain, no nausea or vomiting, she had a large bowel movement per nursing but patient mentions only small BM  Physical Exam: Vitals:   06/12/23 0752 06/12/23 0813 06/12/23 0843 06/12/23 0856  BP: (!) 77/38 (!) 129/55 [name])  126/54   Pulse: 65   63  Resp: (!) 22 (!) 23 (!) 21   Temp: 98.6 F (37 C)     TempSrc: Oral     SpO2: 98%     Weight:      Height:       Neurology awake and alert ENT with mild pallor Cardiovascular with S1 and S2 present and irregularly irregular with no gallops, positive systolic murmur at the apex Respiratory with no rales or  wheezing Abdomen with no distention No lower extremity edema  Data Reviewed:    Family Communication: I spoke with patient's son over the phone we talked in detail about patient's condition, plan of care and prognosis and all questions were addressed.   Disposition: Status is: Inpatient Remains inpatient appropriate because: hypotension   Planned Discharge Destination: Skilled nursing facility      Author: Coralie Keens, MD 06/12/2023 10:22 AM  For on call review [email or web address].

## 2023-06-12 NOTE — NC FL2 (Signed)
[place] MEDICAID FL2 LEVEL OF CARE FORM     IDENTIFICATION  Patient Name: Katelyn Jackson Birthdate: 05-Aug-1946 Sex: female Admission Date (Current Location): 06/09/2023  [place] and [place] Number:  [name] [phone] Facility and Address:  [facility], [address] [place], [place] [address]      Provider Number: [id number]  Attending Physician Name and Address:  Coralie Keens,*  Relative Name and Phone Number:  [name]   [phone]    Current Level of Care: Hospital Recommended Level of Care: Skilled Nursing Facility Prior Approval Number:    Date Approved/Denied:   PASRR Number: [id number]  Discharge Plan: SNF    Current Diagnoses: Patient Active Problem List   Diagnosis Date Noted   Dehydration 06/10/2023   Constipation 06/09/2023   Foley catheter in place prior to arrival 06/09/2023   Increased endometrial stripe thickness 06/09/2023   Closed patellar sleeve fracture of left knee 05/20/2023   Fall 05/18/2023   Patella fracture 05/18/2023   Clavicle fracture 05/18/2023   Abnormal urinalysis 05/18/2023   Chronic kidney disease, stage 3b (HCC) 05/18/2023   History of breast cancer 05/18/2023   Intertrigo 04/17/2023   Skin lesion 03/08/2023   Chronic heart failure with preserved ejection fraction (HCC) 12/16/2022   Callus 11/04/2022   Cardiac pacemaker in situ 12/10/2021   Morbid obesity with BMI of 45.0-49.9, adult (HCC) 12/10/2021   Breast lesion 12/07/2021   Dry skin 09/22/2021   Colonization with VRE (vancomycin-resistant enterococcus) 09/22/2021   Osteoarthritis of right knee 08/19/2021   Herpes labialis 08/19/2021   Liver cirrhosis (HCC) 07/22/2021   Abscess    Prosthetic joint infection (HCC)    Abscess of right shoulder 07/21/2021   History of pacemaker 07/21/2021   Pressure injury of skin 07/03/2021   Hypothyroidism    Cellulitis    Postoperative anemia due to acute blood loss 03/19/2021    Dyspnea 03/19/2021   Symptomatic anemia 03/19/2021   Status post right shoulder hemiarthroplasty 03/17/2021   Bradycardia 01/05/2021   CAP (community acquired pneumonia) 01/05/2021   CKD (chronic kidney disease) stage 4, GFR 15-29 ml/min (HCC) 01/05/2021   Bilateral pain of leg and foot 10/08/2020   Abscess of left thumb 06/17/2020   Cellulitis and abscess of left leg 06/17/2020   Herpes zoster 02/18/2020   GERD (gastroesophageal reflux disease) 11/25/2019   Wheezing 10/26/2017   Sciatica of left side 08/29/2017   Morbid obesity (HCC) 07/11/2017   Hyperglycemia 07/11/2017   Low back pain 06/19/2017   Acute renal failure superimposed on stage 3b chronic kidney disease (HCC) 06/19/2017   Hypokalemia 06/19/2017   Acute lower UTI 06/19/2017   Anemia of chronic disease 02/16/2017   Malignant neoplasm of upper-outer quadrant of left breast in female, estrogen receptor positive (HCC) 02/10/2017   Essential hypertension 04/05/2016   Dyslipidemia 04/05/2016   Chronic atrial fibrillation (HCC) 04/05/2016   Pulmonary hypertension (HCC) 04/05/2016   Lymphedema 04/05/2016    Orientation RESPIRATION BLADDER Height & Weight     Self, Time, Situation, Place  Normal Indwelling catheter, Continent Weight: 202 lb 13.2 oz (92 kg) Height:  5\' 1"  (154.9 cm)  BEHAVIORAL SYMPTOMS/MOOD NEUROLOGICAL BOWEL NUTRITION STATUS      Continent Diet (see discharge summary)  AMBULATORY STATUS COMMUNICATION OF NEEDS Skin   Total Care Verbally Other (Comment) (PU Stage and Appropriate Care (1. stage 3 upper thigh; 2.  Stage 2 sacrum 3.  Lower left thigh wound))  Personal Care Assistance Level of Assistance  Bathing, Feeding, Dressing, Total care Bathing Assistance: Maximum assistance Feeding assistance: Maximum assistance Dressing Assistance: Maximum assistance Total Care Assistance: Maximum assistance   Functional Limitations Info  Sight, Hearing, Speech Sight Info:  Adequate Hearing Info: Adequate Speech Info: Adequate    SPECIAL CARE FACTORS FREQUENCY  PT (By licensed PT), OT (By licensed OT)     PT Frequency: 5x week OT Frequency: 5x week            Contractures Contractures Info: Not present    Additional Factors Info  Code Status, Allergies Code Status Info: full Allergies Info: Hibiclens (Chlorhexidine Gluconate), Penicillins           Current Medications (06/12/2023):  This is the current hospital active medication list Current Facility-Administered Medications  Medication Dose Route Frequency Provider Last Rate Last Admin   0.9 %  sodium chloride infusion   Intravenous Continuous Arrien, York Ram, MD 50 mL/hr at 06/10/23 1104 New Bag at 06/10/23 1104   acetaminophen (TYLENOL) tablet 650 mg  650 mg Oral Q6H PRN Charlsie Quest, MD   650 mg at 06/12/23 0856   Or   acetaminophen (TYLENOL) suppository 650 mg  650 mg Rectal Q6H PRN Charlsie Quest, MD       allopurinol (ZYLOPRIM) tablet 50 mg  50 mg Oral Daily Darreld Mclean R, MD   50 mg at 06/12/23 0856   apixaban (ELIQUIS) tablet 5 mg  5 mg Oral BID Darreld Mclean R, MD   5 mg at 06/12/23 0856   carvedilol (COREG) tablet 12.5 mg  12.5 mg Oral BID WC Darreld Mclean R, MD   12.5 mg at 06/12/23 0856   levothyroxine (SYNTHROID) tablet 50 mcg  50 mcg Oral QAC breakfast Darreld Mclean R, MD   50 mcg at 06/12/23 0630   ondansetron (ZOFRAN) tablet 4 mg  4 mg Oral Q6H PRN Charlsie Quest, MD       Or   ondansetron (ZOFRAN) injection 4 mg  4 mg Intravenous Q6H PRN Darreld Mclean R, MD   4 mg at 06/11/23 1215   pantoprazole (PROTONIX) EC tablet 40 mg  40 mg Oral QAC breakfast Darreld Mclean R, MD   40 mg at 06/12/23 0856   polyethylene glycol (MIRALAX / GLYCOLAX) packet 17 g  17 g Oral BID Coralie Keens, MD   17 g at 06/11/23 2103   pravastatin (PRAVACHOL) tablet 20 mg  20 mg Oral q1800 Darreld Mclean R, MD   20 mg at 06/11/23 1737   rifaximin (XIFAXAN) tablet 550 mg  550 mg Oral  BID Charlsie Quest, MD   550 mg at 06/12/23 0856   sodium chloride flush (NS) 0.9 % injection 3 mL  3 mL Intravenous Q12H Charlsie Quest, MD   3 mL at 06/11/23 2103     Discharge Medications: Please see discharge summary for a list of discharge medications.  Relevant Imaging Results:  Relevant Lab Results:   Additional Information SSN: [id number]  Lorri Frederick, LCSW

## 2023-06-13 DIAGNOSIS — K5641 Fecal impaction: Secondary | ICD-10-CM | POA: Diagnosis not present

## 2023-06-13 DIAGNOSIS — R109 Unspecified abdominal pain: Secondary | ICD-10-CM

## 2023-06-13 DIAGNOSIS — E876 Hypokalemia: Secondary | ICD-10-CM | POA: Diagnosis not present

## 2023-06-13 DIAGNOSIS — N1832 Chronic kidney disease, stage 3b: Secondary | ICD-10-CM | POA: Diagnosis not present

## 2023-06-13 MED ORDER — IOHEXOL 9 MG/ML PO SOLN
500.0000 mL | ORAL | Status: AC
  Administered 2023-06-13 (×2): 500 mL via ORAL

## 2023-06-13 NOTE — Care Management Important Message (Signed)
Important Message  Patient Details  Name: Katelyn Jackson MRN: [id number] Date of Birth: 04/15/1946   Medicare Important Message Given:  Yes       06/13/2023, 1:00 PM

## 2023-06-13 NOTE — TOC Progression Note (Addendum)
Transition of Care [facility]) - Progression Note    Patient Details  Name: Katelyn Jackson MRN: [id number] Date of Birth: [birth year]  Transition of Care [facility]) CM/SW Contact  Lorri Frederick, LCSW Phone Number: 06/13/2023, 1:00 PM  Clinical Narrative:   SNF auth approved in [place]: [id number], [id number], 3 days: 8/6-8/8.    CSW confirmed with Soy/Shannon Wallace Cullens that they can receive pt today.  MD informed, no DC today.    Expected Discharge Plan: Skilled Nursing Facility Barriers to Discharge: Continued Medical Work up, Other (must enter comment) (insurance auth)  Expected Discharge Plan and Services In-house Referral: Clinical Social Work   Post Acute Care Choice: Skilled Nursing Facility Living arrangements for the past 2 months: Single Family Home                                       Social Determinants of Health (SDOH) Interventions SDOH Screenings   Food Insecurity: No Food Insecurity (05/18/2023)  Housing: Patient Unable To Answer (05/18/2023)  Transportation Needs: No Transportation Needs (05/18/2023)  Utilities: Not At Risk (05/18/2023)  Alcohol Screen: Low Risk  (06/15/2022)  Depression (PHQ2-9): Low Risk  (04/17/2023)  Financial Resource Strain: Low Risk  (06/15/2022)  Physical Activity: Inactive (06/15/2022)  Social Connections: Socially Isolated (06/15/2022)  Stress: No Stress Concern Present (06/15/2022)  Tobacco Use: Low Risk  (06/09/2023)    Readmission Risk Interventions    07/06/2021   11:49 AM 03/19/2021    3:03 PM  Readmission Risk Prevention Plan  Transportation Screening Complete Complete  HRI or Home Care Consult  Complete  Social Work Consult for Recovery Care Planning/Counseling  Complete  Palliative Care Screening  Not Applicable  Medication Review Oceanographer)  Complete  HRI or Home Care Consult Complete   SW Recovery Care/Counseling Consult Complete   Palliative Care Screening Not Applicable   Skilled Nursing Facility Complete

## 2023-06-13 NOTE — Progress Notes (Signed)
[facility]  PROGRESS NOTE  Katelyn Jackson LKG:[id number] DOB: August 05, 1946 DOA: 06/09/2023 PCP: Tresa Garter, MD   Brief HPI:   77 y.o. female with medical history significant for permanent atrial fibrillation, s/p PPM, CKD stage IIIb, hepatic cirrhosis, HTN, HLD, hypothyroidism, chronic lymphedema, gout who is admitted with hypokalemia. Recent  hospitalization 07/10 to 05/24/23 for mechanical fall with left patellar and left distal clavicle fracture, complicated with urinary tract infection.  Reported no bowel movement for the last 5 days, progressed to abdominal pain for the last 3 days prior to admission. She had foley cathter placed for urinary retention.  CT abdomen with large volume of stool throughout the colon with stool distended rectum measuring up to 9.0 x 8.3 cm diameter. Positive peri rectal edema and inflammation. Imaging suggesting fecal impaction/ Stercoral colitis not excluded.  Heterogenous endometrial stripe thickening in the fundal region up to 17 mmm   Assessment/Plan:   Abdominal pain/severe constipation/?  Stercoral colitis -Had 2 small BMs last night, continues to have cramping in lower abdomen -Will repeat CT abdomen/pelvis with contrast; creatinine is 1.31 -Increase IV fluids to 75 mill per hour -If CT shows stercoral colitis/ulcer, consider general surgery consultation  CKD stage IIIb -Creatinine stable at 1.31 -Increase normal saline to 75 mill per hour -Will get IV contrast today, checked with radiology, will be okay to give IV contrast -Follow BMP in am  Chronic atrial fibrillation -Continue rate control with carvedilol and anticoagulation with apixaban  Hypothyroidism -Continue Synthroid  Liver cirrhosis -NASH, no signs of decompensation -continue rifaximin   Communicated fracture of left patella and distal clavicle fracture, POA: Occurring after a fall last month prior to admission 7/10-7/17.  Managed conservatively with knee  immobilizer and sling.    Incidental CT finding: Heterogeneous endometrial stripe thickening in the fundal region measuring up to 17 mm noted on CT A/P.  Pelvic ultrasound did not show significant abnormality  Hypothyroidism: Continue Synthroid.   History of gout: Continue allopurinol  Medications     allopurinol  50 mg Oral Daily   apixaban  5 mg Oral BID   carvedilol  12.5 mg Oral BID WC   levothyroxine  50 mcg Oral QAC breakfast   pantoprazole  40 mg Oral QAC breakfast   polyethylene glycol  17 g Oral BID   pravastatin  20 mg Oral q1800   rifaximin  550 mg Oral BID   sodium chloride flush  3 mL Intravenous Q12H     Data Reviewed:   CBG:  No results for input(s): "GLUCAP" in the last 168 hours.  SpO2: 98 %    Vitals:   06/12/23 1845 06/12/23 1922 06/13/23 0352 06/13/23 0733  BP:  (!) 116/58 109/61 (!) 122/57  Pulse: 60 63 66 63  Resp:  17 17 20   Temp:  98.8 F (37.1 C) 99 F (37.2 C) 98.1 F (36.7 C)  TempSrc:  Oral  Oral  SpO2:  97% 97% 98%  Weight:      Height:          Data Reviewed:  Basic Metabolic Panel: Recent Labs  Lab 06/09/23 1728 06/09/23 1809 06/09/23 2003 06/10/23 0118 06/11/23 0359 06/12/23 0032 06/13/23 0145  NA 140 141  --  137 136 135 133*  K 2.7* 2.6*  --  3.5 3.3* 4.2 4.2  CL 100 99  --  98 100 100 102  CO2 29  --   --  28 29 27 24   GLUCOSE 100* 95  --  94 101* 94 98  BUN 31* 31*  --  32* 32* 24* 20  CREATININE 1.49* 1.50*  --  1.62* 1.63* 1.48* 1.31*  CALCIUM 9.0  --   --  9.6 9.5 9.6 9.2  MG  --   --  2.7*  --   --   --   --     CBC: Recent Labs  Lab 06/09/23 1728 06/09/23 1809 06/10/23 0118 06/13/23 0145  WBC 7.7  --  6.6 5.2  NEUTROABS 5.7  --   --   --   HGB 11.0* 11.6* 11.9* 9.5*  HCT 36.2 34.0* 39.3 32.4*  MCV 95.0  --  96.6 98.2  PLT 139*  --  140* 116*    LFT Recent Labs  Lab 06/09/23 1728  AST 27  ALT 14  ALKPHOS 124  BILITOT 1.3*  PROT 5.8*  ALBUMIN 2.7*      Antibiotics: Anti-infectives (From admission, onward)    Start     Dose/Rate Route Frequency Ordered Stop   06/09/23 2200  rifaximin (XIFAXAN) tablet 550 mg        550 mg Oral 2 times daily 06/09/23 2125          DVT prophylaxis: Apixaban  Code Status: Full code  Family Communication:  discussed with patient's son on phone   CONSULTS    Subjective   Still complains of abdominal pain.  Had 2 small BMs last night.   Objective    Physical Examination:   General-appears in no acute distress Heart-S1-S2, regular, no murmur auscultated Lungs-clear to auscultation bilaterally, no wheezing or crackles auscultated Abdomen-soft, mild tenderness in both right middle and left lower quadrants on palpation Extremities-no edema in the lower extremities Neuro-alert, oriented x3, no focal deficit noted  Status is: Inpatient:      Pressure Injury 07/03/21 Sacrum Stage 2 -  Partial thickness loss of dermis presenting as a shallow open injury with a red, pink wound bed without slough. sacrum/inner gluteal fold (Active)  07/03/21 2105  Location: Sacrum  Location Orientation:   Staging: Stage 2 -  Partial thickness loss of dermis presenting as a shallow open injury with a red, pink wound bed without slough.  Wound Description (Comments): sacrum/inner gluteal fold  Present on Admission: Yes         S    [facility] If 7PM-7AM, please contact night-coverage at [email or web address], Office  [phone]   06/13/2023, 10:23 AM  LOS: 3 days

## 2023-06-14 ENCOUNTER — Inpatient Hospital Stay (HOSPITAL_COMMUNITY): Payer: Medicare HMO

## 2023-06-14 DIAGNOSIS — N1832 Chronic kidney disease, stage 3b: Secondary | ICD-10-CM | POA: Diagnosis not present

## 2023-06-14 MED ORDER — POLYETHYLENE GLYCOL 3350 17 G PO PACK
17.0000 g | PACK | Freq: Three times a day (TID) | ORAL | Status: DC
  Administered 2023-06-14 – 2023-06-15 (×3): 17 g via ORAL
  Filled 2023-06-14 (×3): qty 1

## 2023-06-14 MED ORDER — GERHARDT'S BUTT CREAM
TOPICAL_CREAM | Freq: Two times a day (BID) | CUTANEOUS | Status: DC
  Filled 2023-06-14: qty 1

## 2023-06-14 MED ORDER — IOHEXOL 350 MG/ML SOLN
75.0000 mL | Freq: Once | INTRAVENOUS | Status: AC | PRN
  Administered 2023-06-14: 75 mL via INTRAVENOUS

## 2023-06-14 MED ORDER — SORBITOL 70 % SOLN
960.0000 mL | TOPICAL_OIL | Freq: Three times a day (TID) | ORAL | Status: DC | PRN
  Administered 2023-06-14: 960 mL via RECTAL
  Filled 2023-06-14 (×2): qty 240

## 2023-06-14 NOTE — Progress Notes (Signed)
Physical Therapy Treatment Patient Details Name: Katelyn Jackson MRN: [id number] DOB: 21-Apr-1946 Today's Date: 06/14/2023   History of Present Illness The pt is a 77 yo female presenting from [name] 8/2 with constipation. PMH includes: afib on Eliquis s/p PPM, hypothyroidism, CKD III, hepatic cirrhosis, HTN, HLD, chronic lymphedema, gout, and recent admission after a fall with L distal clavicle fx and L patella fx.    PT Comments  Pt is limited by abdominal pain at this time, declining functional mobility attempts but agreeable to bed level exercises today instead. She demonstrates deficits particularly in quadriceps and bil hip flexors strength. Hopefully her pain will improve soon so pt will be able to make progress with OOB mobility. Will continue to follow acutely.     If plan is discharge home, recommend the following: Two people to help with walking and/or transfers;Two people to help with bathing/dressing/bathroom;Assistance with cooking/housework;Direct supervision/assist for medications management;Assistance with feeding;Direct supervision/assist for financial management;Assist for transportation;Help with stairs or ramp for entrance   Can travel by private vehicle     No  Equipment Recommendations  Hospital bed;Other (comment) (hoyer lift)    Recommendations for Other Services       Precautions / Restrictions Precautions Precautions: Fall Required Braces or Orthoses: Knee Immobilizer - Left;Sling Knee Immobilizer - Left: On at all times Restrictions Weight Bearing Restrictions: Yes LUE Weight Bearing: Non weight bearing LLE Weight Bearing: Non weight bearing     Mobility  Bed Mobility               General bed mobility comments: Pt declined mobility due to pain but agreeable to bed level exercises    Transfers                   General transfer comment: Pt declined mobility due to pain but agreeable to bed level exercises     Ambulation/Gait               General Gait Details: Pt declined mobility due to pain but agreeable to bed level exercises   Stairs             Wheelchair Mobility     Tilt Bed    Modified Rankin (Stroke Patients Only)       Balance                                            Cognition Arousal: Alert Behavior During Therapy: WFL for tasks assessed/performed Overall Cognitive Status: Within Functional Limits for tasks assessed                                 General Comments: Pt repeating self at times, perseverating on her pain. Pt stated something along the lines of "get me 2 dogs and I can stand", not quite sure what this means.        Exercises General Exercises - Upper Extremity Elbow Flexion: AROM, Both, 10 reps, Supine Elbow Extension: AROM, Both, 10 reps, Supine Digit Composite Flexion: AROM, Both, 10 reps, Supine Composite Extension: AROM, Both, 10 reps, Supine General Exercises - Lower Extremity Ankle Circles/Pumps: AROM, Both, 10 reps, Supine Quad Sets: AROM, Right, 10 reps, Supine Short Arc Quad: AROM, Right, 10 reps, Supine Heel Slides: AROM, Right, 10 reps, Supine Hip ABduction/ADduction: AROM, AAROM, Both,  10 reps, Supine (AAROM at L leg, AROM on R) Straight Leg Raises: AAROM, Both, 10 reps, Supine    General Comments        Pertinent Vitals/Pain Pain Assessment Pain Assessment: Faces Faces Pain Scale: Hurts whole lot Pain Location: abdomen Pain Descriptors / Indicators: Grimacing, Crying, Cramping, Discomfort Pain Intervention(s): Limited activity within patient's tolerance, Monitored during session, Repositioned, Patient requesting pain meds-RN notified    Home Living                          Prior Function            PT Goals (current goals can now be found in the care plan section) Acute Rehab PT Goals Patient Stated Goal: to return to rehab, have a bowel movement PT Goal  Formulation: With patient Time For Goal Achievement: 06/25/23 Potential to Achieve Goals: Fair Progress towards PT goals: Not progressing toward goals - comment (limited by pain)    Frequency    Min 1X/week      PT Plan Current plan remains appropriate    Co-evaluation              AM-PAC PT "6 Clicks" Mobility   Outcome Measure  Help needed turning from your back to your side while in a flat bed without using bedrails?: A Little Help needed moving from lying on your back to sitting on the side of a flat bed without using bedrails?: A Lot Help needed moving to and from a bed to a chair (including a wheelchair)?: A Lot Help needed standing up from a chair using your arms (e.g., wheelchair or bedside chair)?: Total Help needed to walk in hospital room?: Total Help needed climbing 3-5 steps with a railing? : Total 6 Click Score: 10    End of Session Equipment Utilized During Treatment: Left knee immobilizer;Other (comment) (sling) Activity Tolerance: Patient limited by pain Patient left: in bed;with call bell/phone within reach;with bed alarm set Nurse Communication: Mobility status;Patient requests pain meds PT Visit Diagnosis: Unsteadiness on feet (R26.81);Muscle weakness (generalized) (M62.81);Pain Pain - Right/Left: Left Pain - part of body: Shoulder;Knee     Time: 3762-8315 PT Time Calculation (min) (ACUTE ONLY): 22 min  Charges:    $Therapeutic Exercise: 8-22 mins PT General Charges $$ ACUTE PT VISIT: 1 Visit                     Raymond Gurney, PT, DPT Acute Rehabilitation Services  Office: [phone]    Jewel Baize 06/14/2023, 5:21 PM

## 2023-06-14 NOTE — Consult Note (Signed)
WOC Nurse Consult Note: Reason for Consult:moisture associated skin damage to perirectal skin.  Pressure injuries to bilateral posterior thighs, consistent with sitting in wheelchair for prolonged periods.  Patient is tearful and states she has not had a BM in 9 days.  She is complaining of abdominal pain. She has had a small amount of stool and this is promptly cleansed and barrier cream applied.  Is in left shoulder sling and left leg immobilizer at this time.   Wound type: Moisture and pressure.  Pressure Injury POA: Yes  this was present on last admission.  I have reassessed today.  Has foley catheter in place due to urinary retention.  Measurement: Perirectal erythema and intact irritation from chronic exposure to moisture and incontinence.  Extends circumferentially 5 cm from rectal opening.   Left posterior thigh:  1 cm x 0.5 cm x 0.1 cm  Right posterior thigh:  2 cm x 1 cm x 0.1 cm  (Thigh wounds are noted as Stage 3 pressure injuries last admission and they have improved since last WOC team assessment. Since full thickness, will remain stage 3 pressure injuries)  Wound bed: red and moist  Drainage (amount, consistency, odor) minimal serosanguinous  no odor.  Periwound: intact, dry skin  Chronic skin changes to lower legs, mycotic toenails and edema to feet.  Dressing procedure/placement/frequency: Cleanse sacral/buttocks area with soap and water daily and PRN incontinence (or moisture) Apply GErhardts butt paste each shift and PRN soilage.   Silicone foam dressings to posterior thigh wounds.  Change every three days and PRN soilage.  Will not follow at this time.  Please re-consult if needed.  Mike Gip MSN, RN, FNP-BC CWON Wound, Ostomy, Continence Nurse Outpatient [facility] [phone] Pager [phone]

## 2023-06-14 NOTE — Progress Notes (Signed)
PROGRESS NOTE    Katelyn Jackson  DGU:[id number] DOB: 25-Jul-1946 DOA: 06/09/2023 PCP: Tresa Garter, MD   Brief Narrative:  77 y.o. female with medical history significant for permanent atrial fibrillation, s/p PPM, CKD stage IIIb, hepatic cirrhosis, HTN, HLD, hypothyroidism, chronic lymphedema, gout who is admitted with hypokalemia. Recent  hospitalization 07/10 to 05/24/23 for mechanical fall with left patellar and left distal clavicle fracture, complicated with urinary tract infection. Patient complaining of increased abdominal pain/cramping with no bowel movement for >5 days. Imaging consistent with stercoral colitis/edema - repeat CT 8/7 appears similar. Aggressive bowel regimen started.  Plan to discharge to SNF once BM/colitis is resolving. Incidentally noted heterogenous endometrial stripe thickening in the fundal region up to 17 mmm   Assessment & Plan:   Principal Problem:   Chronic kidney disease, stage 3b (HCC) Active Problems:   Constipation   Chronic atrial fibrillation (HCC)   Hypothyroidism   Liver cirrhosis (HCC)   Clavicle fracture   Foley catheter in place prior to arrival   Dehydration   Class 2 obesity   Abdominal pain/severe with profound constipation/stercoral colitis -No real BM over the last 48h (small hard stools noted) -Repeat CT shows ongoing stool burden/inflammation/stercoral colitis -Aggressive bowel regimen today - SMOG enema q8h - increase miralax TID - consider bowel prep kit in the next 24h if no real improvement.  CKD stage IIIb -Follow post contrast creatinine   Chronic atrial fibrillation -Continue rate control with carvedilol and anticoagulation with apixaban   Hypothyroidism -Continue Synthroid   Liver cirrhosis -NASH, no signs of decompensation -continue rifaximin   Communicated fracture of left patella and distal clavicle fracture, POA: Occurring after a fall last month prior to admission 7/10-7/17.  Managed  conservatively with knee immobilizer and sling.  Incidental CT finding: Heterogeneous endometrial stripe thickening in the fundal region measuring up to 17 mm noted on CT A/P.  Pelvic ultrasound did not show significant abnormality   Hypothyroidism: Continue Synthroid.   History of gout: Continue allopurinol - without current flare  DVT prophylaxis: apixaban (ELIQUIS) tablet 5 mg  Code Status:   Code Status: Full Code Family Communication: None present  Status is: Inpt  Dispo: The patient is from: SNF              Anticipated d/c is to: SNF              Anticipated d/c date is: 24-48h              Patient currently NOT medically stable for discharge  Consultants:  None  Procedures:  None  Antimicrobials:  None   Subjective: No acute issues/events overnight - no BM reported  Objective: Vitals:   06/13/23 1313 06/13/23 2014 06/14/23 0459 06/14/23 0803  BP: 125/64 (!) 129/53 (!) 127/58 (!) 142/69  Pulse: 65 65 64 65  Resp: 18 16 16 18   Temp: 98.3 F (36.8 C) 98.9 F (37.2 C)  98.7 F (37.1 C)  TempSrc: Oral Oral    SpO2: 98% 94% 98% 99%  Weight:      Height:        Intake/Output Summary (Last 24 hours) at 06/14/2023 9563 Last data filed at 06/13/2023 2038 Gross per 24 hour  Intake --  Output 1300 ml  Net -1300 ml   Filed Weights   06/09/23 1502  Weight: 92 kg    Examination:  General-appears in no acute distress, resting comfortably Heart-S1-S2, regular, no murmur auscultated Lungs-clear to auscultation bilaterally, no wheezing or  crackles auscultated Abdomen-soft, suprapubic/LLQ tenderness on deep palpation Extremities-no edema in the lower extremities Neuro-alert, oriented x3, no focal deficit noted  Radiology Studies: CT ABDOMEN PELVIS W CONTRAST  Result Date: 06/14/2023 CLINICAL DATA:  Right lower quadrant abdominal pain, stercoral colitis EXAM: CT ABDOMEN AND PELVIS WITH CONTRAST TECHNIQUE: Multidetector CT imaging of the abdomen and pelvis was  performed using the standard protocol following bolus administration of intravenous contrast. RADIATION DOSE REDUCTION: This exam was performed according to the departmental dose-optimization program which includes automated exposure control, adjustment of the mA and/or kV according to patient size and/or use of iterative reconstruction technique. CONTRAST:  75mL OMNIPAQUE IOHEXOL 350 MG/ML SOLN COMPARISON:  None Available. FINDINGS: Lower chest: Moderate cardiomegaly. Pacemaker leads are seen within the right ventricle toward the apex. No pericardial effusion. Small bilateral pleural effusions are present. Moderate hiatal hernia. Hepatobiliary: No focal liver abnormality is seen. No gallstones, gallbladder wall thickening, or biliary dilatation. Pancreas: Unremarkable Spleen: Unremarkable Adrenals/Urinary Tract: The adrenal glands are unremarkable. The kidneys are normal. Foley catheter balloon is seen within a decompressed bladder lumen. Stomach/Bowel: There is large volume stool within the rectal vault with mild surrounding perirectal inflammatory stranding and edema within the presacral space suggesting changes of stercoral proctitis. Moderate volume stool throughout the colon. The stomach, small bowel, and large bowel are otherwise unremarkable. No evidence of obstruction. No free intraperitoneal gas or fluid. Appendix absent. Vascular/Lymphatic: Mild aortoiliac atherosclerotic calcification. Mega cava noted, a congenital variant. The abdominal vasculature is otherwise unremarkable. Reproductive: Involuted subserosal fibroid noted within the uterine fundus. The endometrium appears thickened, measuring up to 11 mm, not well evaluated on this examination. There is, additionally, suggestion of an enhancing nodule within the endometrium measuring 5 mm on axial image # 52/3. No adnexal mass. Other: Small fat containing right inguinal hernia. Musculoskeletal: The osseous structures are age-appropriate. No acute bone  abnormality. IMPRESSION: 1. Large volume stool within the rectal vault with mild surrounding perirectal inflammatory stranding and edema within the presacral space suggesting changes of stercoral proctitis. 2. Moderate volume stool throughout the colon. 3. Moderate cardiomegaly. Small bilateral pleural effusions. 4. Moderate hiatal hernia. 5. Thickened endometrium with suggestion of an enhancing nodule within the endometrium measuring 5 mm. This is better evaluated on prior sonogram of 06/10/2023. Endometrial sampling may be helpful for further management. Aortic Atherosclerosis (ICD10-I70.0). Electronically Signed   By: Helyn Numbers M.D.   On: 06/14/2023 01:38        Scheduled Meds:  allopurinol  50 mg Oral Daily   apixaban  5 mg Oral BID   carvedilol  12.5 mg Oral BID WC   levothyroxine  50 mcg Oral QAC breakfast   pantoprazole  40 mg Oral QAC breakfast   polyethylene glycol  17 g Oral BID   pravastatin  20 mg Oral q1800   rifaximin  550 mg Oral BID   sodium chloride flush  3 mL Intravenous Q12H   Continuous Infusions:  sodium chloride 75 mL/hr at 06/13/23 1612     LOS: 4 days   Time spent:  Azucena Fallen, DO [facility]  If 7PM-7AM, please contact night-coverage [email or web address]  06/14/2023, 8:07 AM

## 2023-06-14 NOTE — Plan of Care (Signed)

## 2023-06-15 DIAGNOSIS — L89892 Pressure ulcer of other site, stage 2: Secondary | ICD-10-CM | POA: Diagnosis not present

## 2023-06-15 DIAGNOSIS — I482 Chronic atrial fibrillation, unspecified: Secondary | ICD-10-CM | POA: Diagnosis not present

## 2023-06-15 DIAGNOSIS — R5381 Other malaise: Secondary | ICD-10-CM | POA: Diagnosis not present

## 2023-06-15 DIAGNOSIS — I4891 Unspecified atrial fibrillation: Secondary | ICD-10-CM | POA: Diagnosis not present

## 2023-06-15 DIAGNOSIS — K5900 Constipation, unspecified: Secondary | ICD-10-CM | POA: Diagnosis not present

## 2023-06-15 DIAGNOSIS — E039 Hypothyroidism, unspecified: Secondary | ICD-10-CM | POA: Diagnosis not present

## 2023-06-15 DIAGNOSIS — E785 Hyperlipidemia, unspecified: Secondary | ICD-10-CM | POA: Diagnosis not present

## 2023-06-15 DIAGNOSIS — K7460 Unspecified cirrhosis of liver: Secondary | ICD-10-CM | POA: Diagnosis not present

## 2023-06-15 DIAGNOSIS — I110 Hypertensive heart disease with heart failure: Secondary | ICD-10-CM | POA: Diagnosis not present

## 2023-06-15 DIAGNOSIS — I493 Ventricular premature depolarization: Secondary | ICD-10-CM | POA: Insufficient documentation

## 2023-06-15 DIAGNOSIS — R195 Other fecal abnormalities: Secondary | ICD-10-CM | POA: Diagnosis not present

## 2023-06-15 DIAGNOSIS — S0990XD Unspecified injury of head, subsequent encounter: Secondary | ICD-10-CM | POA: Diagnosis not present

## 2023-06-15 DIAGNOSIS — F4321 Adjustment disorder with depressed mood: Secondary | ICD-10-CM | POA: Diagnosis not present

## 2023-06-15 DIAGNOSIS — I5032 Chronic diastolic (congestive) heart failure: Secondary | ICD-10-CM | POA: Diagnosis not present

## 2023-06-15 DIAGNOSIS — S42002D Fracture of unspecified part of left clavicle, subsequent encounter for fracture with routine healing: Secondary | ICD-10-CM | POA: Diagnosis not present

## 2023-06-15 DIAGNOSIS — I10 Essential (primary) hypertension: Secondary | ICD-10-CM | POA: Diagnosis not present

## 2023-06-15 DIAGNOSIS — G8929 Other chronic pain: Secondary | ICD-10-CM | POA: Diagnosis not present

## 2023-06-15 DIAGNOSIS — M109 Gout, unspecified: Secondary | ICD-10-CM | POA: Diagnosis not present

## 2023-06-15 DIAGNOSIS — S42035A Nondisplaced fracture of lateral end of left clavicle, initial encounter for closed fracture: Secondary | ICD-10-CM | POA: Diagnosis not present

## 2023-06-15 DIAGNOSIS — S42033A Displaced fracture of lateral end of unspecified clavicle, initial encounter for closed fracture: Secondary | ICD-10-CM | POA: Diagnosis not present

## 2023-06-15 DIAGNOSIS — Z7401 Bed confinement status: Secondary | ICD-10-CM | POA: Diagnosis not present

## 2023-06-15 DIAGNOSIS — N8500 Endometrial hyperplasia, unspecified: Secondary | ICD-10-CM | POA: Insufficient documentation

## 2023-06-15 DIAGNOSIS — S82002D Unspecified fracture of left patella, subsequent encounter for closed fracture with routine healing: Secondary | ICD-10-CM | POA: Diagnosis not present

## 2023-06-15 DIAGNOSIS — R339 Retention of urine, unspecified: Secondary | ICD-10-CM | POA: Diagnosis not present

## 2023-06-15 DIAGNOSIS — S82012A Displaced osteochondral fracture of left patella, initial encounter for closed fracture: Secondary | ICD-10-CM | POA: Diagnosis not present

## 2023-06-15 DIAGNOSIS — G8911 Acute pain due to trauma: Secondary | ICD-10-CM | POA: Diagnosis not present

## 2023-06-15 DIAGNOSIS — D649 Anemia, unspecified: Secondary | ICD-10-CM | POA: Diagnosis not present

## 2023-06-15 DIAGNOSIS — I959 Hypotension, unspecified: Secondary | ICD-10-CM | POA: Diagnosis not present

## 2023-06-15 DIAGNOSIS — I509 Heart failure, unspecified: Secondary | ICD-10-CM | POA: Diagnosis not present

## 2023-06-15 DIAGNOSIS — D638 Anemia in other chronic diseases classified elsewhere: Secondary | ICD-10-CM | POA: Diagnosis not present

## 2023-06-15 DIAGNOSIS — N1832 Chronic kidney disease, stage 3b: Secondary | ICD-10-CM | POA: Diagnosis not present

## 2023-06-15 DIAGNOSIS — Z4789 Encounter for other orthopedic aftercare: Secondary | ICD-10-CM | POA: Diagnosis not present

## 2023-06-15 DIAGNOSIS — K7581 Nonalcoholic steatohepatitis (NASH): Secondary | ICD-10-CM | POA: Diagnosis not present

## 2023-06-15 DIAGNOSIS — R7989 Other specified abnormal findings of blood chemistry: Secondary | ICD-10-CM | POA: Diagnosis not present

## 2023-06-15 DIAGNOSIS — S82002A Unspecified fracture of left patella, initial encounter for closed fracture: Secondary | ICD-10-CM | POA: Diagnosis not present

## 2023-06-15 DIAGNOSIS — Z79899 Other long term (current) drug therapy: Secondary | ICD-10-CM | POA: Diagnosis not present

## 2023-06-15 DIAGNOSIS — R296 Repeated falls: Secondary | ICD-10-CM | POA: Diagnosis not present

## 2023-06-15 DIAGNOSIS — K5641 Fecal impaction: Secondary | ICD-10-CM | POA: Insufficient documentation

## 2023-06-15 MED ORDER — SORBITOL 70 % SOLN: 960.0000 mL | TOPICAL_OIL | Freq: Two times a day (BID) | ORAL | 0 refills | Status: DC | PRN

## 2023-06-15 MED ORDER — GERHARDT'S BUTT CREAM: 1.0000 | TOPICAL_CREAM | Freq: Two times a day (BID) | CUTANEOUS | 1 refills | Status: DC

## 2023-06-15 MED ORDER — POLYETHYLENE GLYCOL 3350 17 G PO PACK: 17.0000 g | PACK | Freq: Two times a day (BID) | ORAL | 1 refills | Status: DC

## 2023-06-15 MED ORDER — SORBITOL 70 % SOLN
960.0000 mL | TOPICAL_OIL | Freq: Three times a day (TID) | ORAL | Status: DC
  Filled 2023-06-15 (×3): qty 240

## 2023-06-15 NOTE — Progress Notes (Signed)
[facility] here to pick up pt, copy of AVS and packet of information given to [facility] transporters. Attempted to call report to [name], hall called [employer], at number [phone]. A voice message that stated "[employer]" and to leave a message, message left with name and number of this nurse for a call back to get a report, pt's name also given and the room number she is returning to.    SW has informed pt's son that she is discharging today back to the facility.   Pt d/c'd via [facility] transport with her belongings.    ,RN

## 2023-06-15 NOTE — Discharge Summary (Signed)
Physician Discharge Summary  Katelyn Jackson WUJ:[id number] DOB: 1946/05/03 DOA: 06/09/2023  PCP: Tresa Garter, MD  Admit date: 06/09/2023 Discharge date: 06/15/2023  Admitted From: SNF Disposition:  SNF  Recommendations for Outpatient Follow-up:  Follow up with PCP vs Gynecology in 1-2 weeks with regards to the incidental endometrial thickening on imaging. Continue to take all medications as directed - if not having 3 soft bowel movements per day(too many or too few) please reach out to primary provider for medication adjustments.  Discharge Condition:Stable  CODE STATUS:Full  Diet recommendation: As tolerated soft, low fat, low salt    Brief/Interim Summary: 77 y.o. female with medical history significant for permanent atrial fibrillation, s/p PPM, CKD stage IIIb, hepatic cirrhosis, HTN, HLD, hypothyroidism, chronic lymphedema, gout, chronic urinary obstruction with chronic indwelling foley catheter - who is admitted with hypokalemia. Recent  hospitalization 07/10 to 05/24/23 for mechanical fall with left patellar and left distal clavicle fracture, complicated with urinary tract infection. Patient complaining of increased abdominal pain/cramping with no bowel movement for >5 days. Imaging consistent with stercoral colitis/edema - repeat CT 8/7 appears similar. Aggressive bowel regimen started with appropriate response. Resume all stool softeners, lactulose, and PRN SMOG enema for ongoing constipation - goal of 3 soft BM/day.   Incidentally noted heterogenous endometrial stripe thickening in the fundal region up to 17 mm - defer to outpatient PCP/ObGyn for further imaging/evaluation per their expertise.  Discharge Diagnoses:  Principal Problem:   Chronic kidney disease, stage 3b (HCC) Active Problems:   Constipation   Chronic atrial fibrillation (HCC)   Hypothyroidism   Liver cirrhosis (HCC)   Clavicle fracture   Foley catheter in place prior to arrival   Dehydration   Class 2  obesity    Discharge Instructions   Allergies as of 06/15/2023       Reactions   Hibiclens [chlorhexidine Gluconate] Hives   Penicillins Itching   Tolerated Cephalosporin Date: 03/17/21. Td Ancef 2g 07/26/21        Medication List     TAKE these medications    allopurinol 100 MG tablet Commonly known as: ZYLOPRIM Take 0.5 tablets (50 mg total) by mouth daily.   apixaban 5 MG Tabs tablet Commonly known as: ELIQUIS Take 1 tablet (5 mg total) by mouth 2 (two) times daily.   bisacodyl 10 MG suppository Commonly known as: DULCOLAX Place 10 mg rectally once as needed (constipation).   carvedilol 12.5 MG tablet Commonly known as: COREG Take 12.5 mg by mouth 2 (two) times daily with a meal.   Decubi-Vite Caps Take 1 capsule by mouth daily.   Gerhardt's butt cream Crea Apply 1 Application topically 2 (two) times daily.   lactulose 10 GM/15ML solution Commonly known as: CHRONULAC TAKE 15 MILLILITERS BY MOUTH THREE TIMESDAILY What changed: See the new instructions.   levothyroxine 50 MCG tablet Commonly known as: SYNTHROID Take 1 tablet (50 mcg total) by mouth daily before breakfast. Overdue for Annual appt must see provider for future refills   lovastatin 20 MG tablet Commonly known as: MEVACOR Take 20 mg by mouth at bedtime.   magnesium oxide 400 MG tablet Commonly known as: MAG-OX Take 1 tablet (400 mg total) by mouth 2 (two) times daily.   omeprazole 40 MG capsule Commonly known as: PRILOSEC TAKE ONE CAPSULE BY MOUTH DAILY What changed: when to take this   polyethylene glycol 17 g packet Commonly known as: MIRALAX / GLYCOLAX Take 17 g by mouth 2 (two) times daily. What changed:  when  to take this additional instructions   potassium chloride 8 MEQ tablet Commonly known as: KLOR-CON TAKE ONE (1) TABLET BY MOUTH EVERY DAY   protein supplement Liqd Take 30 mLs by mouth 2 (two) times daily.   rifaximin 550 MG Tabs tablet Commonly known as:  XIFAXAN Take 1 tablet (550 mg total) by mouth 2 (two) times daily.   sennosides-docusate sodium 8.6-50 MG tablet Commonly known as: SENOKOT-S Take 2 tablets by mouth every 12 (twelve) hours.   sorbitol-magnesium hydroxide-mineral oil-glycerin Place 960 mLs rectally 2 (two) times daily as needed.   torsemide 100 MG tablet Commonly known as: DEMADEX Take 1 tablet (100 mg total) by mouth 2 (two) times daily. What changed:  how much to take when to take this   triamcinolone cream 0.1 % Commonly known as: KENALOG Apply topically 2 (two) times daily. What changed:  how much to take additional instructions        Allergies  Allergen Reactions   Hibiclens [Chlorhexidine Gluconate] Hives   Penicillins Itching    Tolerated Cephalosporin Date: 03/17/21. Td Ancef 2g 07/26/21    Consultations: None   Procedures/Studies: CT ABDOMEN PELVIS W CONTRAST  Result Date: 06/14/2023 CLINICAL DATA:  Right lower quadrant abdominal pain, stercoral colitis EXAM: CT ABDOMEN AND PELVIS WITH CONTRAST TECHNIQUE: Multidetector CT imaging of the abdomen and pelvis was performed using the standard protocol following bolus administration of intravenous contrast. RADIATION DOSE REDUCTION: This exam was performed according to the departmental dose-optimization program which includes automated exposure control, adjustment of the mA and/or kV according to patient size and/or use of iterative reconstruction technique. CONTRAST:  75mL OMNIPAQUE IOHEXOL 350 MG/ML SOLN COMPARISON:  None Available. FINDINGS: Lower chest: Moderate cardiomegaly. Pacemaker leads are seen within the right ventricle toward the apex. No pericardial effusion. Small bilateral pleural effusions are present. Moderate hiatal hernia. Hepatobiliary: No focal liver abnormality is seen. No gallstones, gallbladder wall thickening, or biliary dilatation. Pancreas: Unremarkable Spleen: Unremarkable Adrenals/Urinary Tract: The adrenal glands are  unremarkable. The kidneys are normal. Foley catheter balloon is seen within a decompressed bladder lumen. Stomach/Bowel: There is large volume stool within the rectal vault with mild surrounding perirectal inflammatory stranding and edema within the presacral space suggesting changes of stercoral proctitis. Moderate volume stool throughout the colon. The stomach, small bowel, and large bowel are otherwise unremarkable. No evidence of obstruction. No free intraperitoneal gas or fluid. Appendix absent. Vascular/Lymphatic: Mild aortoiliac atherosclerotic calcification. Mega cava noted, a congenital variant. The abdominal vasculature is otherwise unremarkable. Reproductive: Involuted subserosal fibroid noted within the uterine fundus. The endometrium appears thickened, measuring up to 11 mm, not well evaluated on this examination. There is, additionally, suggestion of an enhancing nodule within the endometrium measuring 5 mm on axial image # 52/3. No adnexal mass. Other: Small fat containing right inguinal hernia. Musculoskeletal: The osseous structures are age-appropriate. No acute bone abnormality. IMPRESSION: 1. Large volume stool within the rectal vault with mild surrounding perirectal inflammatory stranding and edema within the presacral space suggesting changes of stercoral proctitis. 2. Moderate volume stool throughout the colon. 3. Moderate cardiomegaly. Small bilateral pleural effusions. 4. Moderate hiatal hernia. 5. Thickened endometrium with suggestion of an enhancing nodule within the endometrium measuring 5 mm. This is better evaluated on prior sonogram of 06/10/2023. Endometrial sampling may be helpful for further management. Aortic Atherosclerosis (ICD10-I70.0). Electronically Signed   By: Helyn Numbers M.D.   On: 06/14/2023 01:38   US PELVIS (TRANSABDOMINAL ONLY)  Result Date: 06/10/2023 CLINICAL DATA:  Abnormal uterus  lesion on CT. EXAM: TRANSABDOMINAL ULTRASOUND OF PELVIS TECHNIQUE: Transabdominal  ultrasound examination of the pelvis was performed including evaluation of the uterus, ovaries, adnexal regions, and pelvic cul-de-sac. Transabdominal ultrasound examination of the pelvis was performed including evaluation of the uterus, ovaries, adnexal regions, and pelvic cul-de-sac. Patient refused transvaginal exam. COMPARISON:  06/09/2023. FINDINGS: Uterus Measurements: 8.9 x 3.4 x 4.9 cm = volume: 77.2 mL. Vascular calcifications are noted in the uterus. Endometrium Thickness: 5.6 mm. There is a focal lesion in the endometrium measuring 1.4 x 0.8 x 0.8 cm with arterial and venous waveforms, possible polyp. A small amount of free fluid is noted in the endometrial cavity. Right ovary Measurements: 1.4 x 1.0 x 1.0 cm = volume: 0.7 mL. Normal appearance/no adnexal mass. Left ovary Not seen. Other: No free fluid. IMPRESSION: 1. Fluid-filled endometrium with a echogenic lesion with arterial and venous waveforms, possible polyp. 2. Vascular calcifications in the uterine parenchyma. 3. Left ovary is not visualized on exam. Electronically Signed   By: Thornell Sartorius M.D.   On: 06/10/2023 04:02   CT ABDOMEN PELVIS W CONTRAST  Result Date: 06/09/2023 CLINICAL DATA:  Left lower quadrant abdominal pain. EXAM: CT ABDOMEN AND PELVIS WITH CONTRAST TECHNIQUE: Multidetector CT imaging of the abdomen and pelvis was performed using the standard protocol following bolus administration of intravenous contrast. RADIATION DOSE REDUCTION: This exam was performed according to the departmental dose-optimization program which includes automated exposure control, adjustment of the mA and/or kV according to patient size and/or use of iterative reconstruction technique. CONTRAST:  50mL OMNIPAQUE IOHEXOL 350 MG/ML SOLN COMPARISON:  None Available. FINDINGS: Lower chest: The heart is enlarged. Hepatobiliary: No suspicious focal abnormality within the liver parenchyma. Gallbladder is distended. Layering sludge evident. No intrahepatic or  extrahepatic biliary dilation. Pancreas: No focal mass lesion. No dilatation of the main duct. No intraparenchymal cyst. No peripancreatic edema. Spleen: No splenomegaly. No suspicious focal mass lesion. Adrenals/Urinary Tract: No adrenal nodule or mass. Tiny nonobstructing stones are seen in the interpolar and lower pole regions of the right kidney. Left kidney unremarkable. No evidence for hydroureter. Bladder is decompressed by Foley catheter. Stomach/Bowel: Moderate hiatal hernia. Stomach otherwise unremarkable. Duodenum is normally positioned as is the ligament of Treitz. No small bowel wall thickening. No small bowel dilatation. The appendix is not discretely visible, but there is no edema or inflammation in the region of the cecal tip to suggest appendicitis. Large volume of stool throughout the colon with stool distended rectum measuring up to 9.0 x 8.3 cm diameter. There is perirectal edema/inflammation. Vascular/Lymphatic: There is mild atherosclerotic calcification of the abdominal aorta without aneurysm. There is no gastrohepatic or hepatoduodenal ligament lymphadenopathy. No retroperitoneal or mesenteric lymphadenopathy. No pelvic sidewall lymphadenopathy. Reproductive: Heterogeneous endometrial stripe thickening in the fundal region measures up to 17 mm. Calcified uterine fibroids evident. There is no adnexal mass. Other: No intraperitoneal free fluid. Musculoskeletal: No worrisome lytic or sclerotic osseous abnormality. Small right groin hernia contains only fat. IMPRESSION: 1. Large volume of stool throughout the colon with stool distended rectum measuring up to 9.0 x 8.3 cm diameter. There is perirectal edema/inflammation. Imaging features suggest fecal impaction. Stercoral colitis not excluded. 2. Heterogeneous endometrial stripe thickening in the fundal region measures up to 17 mm. Correlation for abnormal vaginal bleeding recommended. Pelvic ultrasound recommended to further evaluate. 3. Moderate  hiatal hernia. 4. Tiny nonobstructing right renal stones. 5.  Aortic Atherosclerosis (ICD10-I70.0). Electronically Signed   By: Kennith Center M.D.   On: 06/09/2023 19:08  DG Shoulder Left  Result Date: 05/23/2023 CLINICAL DATA:  Worsening left shoulder pain today. Known clavicular fracture. EXAM: LEFT SHOULDER - 2+ VIEW COMPARISON:  Radiographs 05/17/2023.  No other comparison studies. FINDINGS: The bones are diffusely demineralized. A definite fracture of the distal left clavicle is not seen on the current study. There are moderate acromioclavicular and moderate to advanced glenohumeral degenerative changes. The humeral head is high-riding with obliteration of the subacromial space. The glenoid is not well defined on this study although appears fragmented. There is no evidence of glenohumeral dislocation. Left subclavian pacemaker noted. IMPRESSION: 1. Moderate to advanced glenohumeral degenerative changes with possible fragmentation of the glenoid. This could be secondary to a subacute fracture. 2. No definite acute fracture of the distal clavicle seen on the current study. 3. High-riding humeral head suggesting chronic rotator cuff tear. 4. Consider further evaluation with CT. Electronically Signed   By: Carey Bullocks M.D.   On: 05/23/2023 17:21   CT Knee Left Wo Contrast  Result Date: 05/18/2023 CLINICAL DATA:  Fracture, knee patella and ?fem condyle fracture EXAM: CT OF THE LEFT KNEE WITHOUT CONTRAST TECHNIQUE: Multidetector CT imaging of the left knee was performed according to the standard protocol. Multiplanar CT image reconstructions were also generated. RADIATION DOSE REDUCTION: This exam was performed according to the departmental dose-optimization program which includes automated exposure control, adjustment of the mA and/or kV according to patient size and/or use of iterative reconstruction technique. COMPARISON:  05/17/2023 FINDINGS: Bones/Joint/Cartilage There is a fracture through the  lower pole of the left patella. Mildly displaced fracture fragments. Advanced tricompartment osteoarthritis with near complete joint space loss most pronounced in the medial compartment. Extensive osteophyte formation. No medial femoral condyle fracture. Moderate joint effusion. Ligaments Suboptimally assessed by CT. Muscles and Tendons Unremarkable Soft tissues Edema throughout the subcutaneous soft tissues IMPRESSION: Displaced fracture through the inferior pole of the left patella. No fracture through the medial femoral condyle. Advanced tricompartment degenerative changes. Moderate joint effusion. Electronically Signed   By: Charlett Nose M.D.   On: 05/18/2023 00:18   DG Hip Unilat W or Wo Pelvis 2-3 Views Left  Result Date: 05/17/2023 CLINICAL DATA:  Fall.  Left pain EXAM: DG HIP (WITH OR WITHOUT PELVIS) 2-3V LEFT COMPARISON:  None Available. FINDINGS: There is no evidence of hip fracture or dislocation. Hip joint space narrowing with marginal osteophytes suggesting mild-to-moderate osteoarthritis. IMPRESSION: 1. No fracture or dislocation. 2. Mild-to-moderate left hip osteoarthritis. Electronically Signed   By: Larose Hires D.O.   On: 05/17/2023 22:24   DG Knee Complete 4 Views Left  Result Date: 05/17/2023 CLINICAL DATA:  Level 2 fall on thinners. EXAM: LEFT KNEE - COMPLETE 4+ VIEW COMPARISON:  None Available. FINDINGS: There is comminuted fracture of the patella. There is also cortical irregularity of the medial femoral condyle concerning for an acute fracture. Evaluation is limited due to positioning and advanced osteoarthritis of the knee. Marked soft tissue swelling about the knee joint. IMPRESSION: 1. Comminuted fracture of the patella. 2. Cortical irregularity of the medial femoral condyle concerning for an acute fracture. Cross-sectional imaging for further evaluation is suggested. 3. Advanced tricompartmental knee osteoarthritis. Electronically Signed   By: Larose Hires D.O.   On: 05/17/2023  22:23   DG Ribs Unilateral W/Chest Left  Result Date: 05/17/2023 CLINICAL DATA:  Fall EXAM: LEFT RIBS AND CHEST - 3+ VIEW COMPARISON:  10/12/2021 FINDINGS: Left pacer remains in place, unchanged. Cardiomegaly, vascular congestion. Diffuse interstitial prominence, likely interstitial edema. No effusions or  acute bony abnormality. No visible rib fracture. Previously seen distal left clavicle fracture on shoulder series not visualized on this study. IMPRESSION: Cardiomegaly, vascular congestion.  Suspect mild interstitial edema. No visible left rib fracture. Electronically Signed   By: Charlett Nose M.D.   On: 05/17/2023 22:21   DG Shoulder Left Port  Result Date: 05/17/2023 CLINICAL DATA:  Fall EXAM: LEFT SHOULDER COMPARISON:  None Available. FINDINGS: Lucency in the distal left clavicle concerning for distal clavicle fracture. AC and glenohumeral joint osteoarthritis with joint space narrowing and spurring. No subluxation or dislocation. Pacer partially visualized. IMPRESSION: Distal left clavicle fracture. Electronically Signed   By: Charlett Nose M.D.   On: 05/17/2023 22:19   CT CERVICAL SPINE WO CONTRAST  Result Date: 05/17/2023 CLINICAL DATA:  Polytrauma, blunt EXAM: CT CERVICAL SPINE WITHOUT CONTRAST TECHNIQUE: Multidetector CT imaging of the cervical spine was performed without intravenous contrast. Multiplanar CT image reconstructions were also generated. RADIATION DOSE REDUCTION: This exam was performed according to the departmental dose-optimization program which includes automated exposure control, adjustment of the mA and/or kV according to patient size and/or use of iterative reconstruction technique. COMPARISON:  None Available. FINDINGS: Alignment: Slight anterolisthesis of C2 on C3 related to facet disease. Skull base and vertebrae: No acute fracture. No primary bone lesion or focal pathologic process. Soft tissues and spinal canal: No prevertebral fluid or swelling. No visible canal  hematoma. Disc levels: Diffuse degenerative disc disease with disc space narrowing and spurring. Diffuse degenerative facet disease bilaterally. Upper chest: No acute findings Other: None IMPRESSION: Cervical spondylosis.  No acute bony abnormality. Electronically Signed   By: Charlett Nose M.D.   On: 05/17/2023 21:42   CT HEAD WO CONTRAST  Result Date: 05/17/2023 CLINICAL DATA:  Head trauma EXAM: CT HEAD WITHOUT CONTRAST TECHNIQUE: Contiguous axial images were obtained from the base of the skull through the vertex without intravenous contrast. RADIATION DOSE REDUCTION: This exam was performed according to the departmental dose-optimization program which includes automated exposure control, adjustment of the mA and/or kV according to patient size and/or use of iterative reconstruction technique. COMPARISON:  Head CT 07/01/2021 FINDINGS: Brain: No evidence of acute infarction, hemorrhage, hydrocephalus, extra-axial collection or mass lesion/mass effect. There is mild periventricular white matter hypodensity, likely chronic small vessel ischemic change. Vascular: Atherosclerotic calcifications are present within the cavernous internal carotid arteries. Skull: Normal. Negative for fracture or focal lesion. Sinuses/Orbits: No acute finding. Other: None. IMPRESSION: 1. No acute intracranial process. 2. Mild chronic small vessel ischemic change. Electronically Signed   By: Darliss Cheney M.D.   On: 05/17/2023 21:39     Subjective: No acute issues/complaints - multiple soft BM noted overnight with marked improvement in abdominal pain   Discharge Exam: Vitals:   06/15/23 0638 06/15/23 0827  BP: 136/69 125/60  Pulse: 63 63  Resp: 15 17  Temp: 98.6 F (37 C) 98.8 F (37.1 C)  SpO2: 96% 94%   Vitals:   06/14/23 1714 06/14/23 2030 06/15/23 0638 06/15/23 0827  BP: 138/64 139/69 136/69 125/60  Pulse: 65 61 63 63  Resp: 19 15 15 17   Temp: 98.6 F (37 C) 98.5 F (36.9 C) 98.6 F (37 C) 98.8 F (37.1 C)   TempSrc: Oral Oral Oral   SpO2: 100% 95% 96% 94%  Weight:      Height:        General-appears in no acute distress, resting comfortably Heart-S1-S2, regular, no murmur auscultated Lungs-clear to auscultation bilaterally, no wheezing or crackles auscultated Abdomen-soft,  mild suprapubic/LLQ tenderness on deep palpation; Foley noted(prior to arrival) Extremities-no edema in the lower extremities Neuro-alert, oriented x3, no focal deficit noted    The results of significant diagnostics from this hospitalization (including imaging, microbiology, ancillary and laboratory) are listed below for reference.     Microbiology: No results found for this or any previous visit (from the past 240 hour(s)).   Labs: BNP (last 3 results) No results for input(s): "BNP" in the last 8760 hours. Basic Metabolic Panel: Recent Labs  Lab 06/09/23 2003 06/10/23 0118 06/11/23 0359 06/12/23 0032 06/13/23 0145 06/15/23 0530  NA  --  137 136 135 133* 139  K  --  3.5 3.3* 4.2 4.2 3.8  CL  --  98 100 100 102 107  CO2  --  28 29 27 24 23   GLUCOSE  --  94 101* 94 98 79  BUN  --  32* 32* 24* 20 12  CREATININE  --  1.62* 1.63* 1.48* 1.31* 1.15*  CALCIUM  --  9.6 9.5 9.6 9.2 9.5  MG 2.7*  --   --   --   --   --    Liver Function Tests: Recent Labs  Lab 06/09/23 1728  AST 27  ALT 14  ALKPHOS 124  BILITOT 1.3*  PROT 5.8*  ALBUMIN 2.7*   No results for input(s): "LIPASE", "AMYLASE" in the last 168 hours. No results for input(s): "AMMONIA" in the last 168 hours. CBC: Recent Labs  Lab 06/09/23 1728 06/09/23 1809 06/10/23 0118 06/13/23 0145  WBC 7.7  --  6.6 5.2  NEUTROABS 5.7  --   --   --   HGB 11.0* 11.6* 11.9* 9.5*  HCT 36.2 34.0* 39.3 32.4*  MCV 95.0  --  96.6 98.2  PLT 139*  --  140* 116*   Cardiac Enzymes: No results for input(s): "CKTOTAL", "CKMB", "CKMBINDEX", "TROPONINI" in the last 168 hours. BNP: Invalid input(s): "POCBNP" CBG: No results for input(s): "GLUCAP" in the  last 168 hours. D-Dimer No results for input(s): "DDIMER" in the last 72 hours. Hgb A1c No results for input(s): "HGBA1C" in the last 72 hours. Lipid Profile No results for input(s): "CHOL", "HDL", "LDLCALC", "TRIG", "CHOLHDL", "LDLDIRECT" in the last 72 hours. Thyroid function studies No results for input(s): "TSH", "T4TOTAL", "T3FREE", "THYROIDAB" in the last 72 hours.  Invalid input(s): "FREET3" Anemia work up No results for input(s): "VITAMINB12", "FOLATE", "FERRITIN", "TIBC", "IRON", "RETICCTPCT" in the last 72 hours. Urinalysis    Component Value Date/Time   COLORURINE YELLOW 06/09/2023 2008   APPEARANCEUR HAZY (A) 06/09/2023 2008   LABSPEC 1.017 06/09/2023 2008   PHURINE 6.0 06/09/2023 2008   GLUCOSEU NEGATIVE 06/09/2023 2008   GLUCOSEU NEGATIVE 07/11/2017 1108   HGBUR SMALL (A) 06/09/2023 2008   BILIRUBINUR NEGATIVE 06/09/2023 2008   KETONESUR NEGATIVE 06/09/2023 2008   PROTEINUR 30 (A) 06/09/2023 2008   UROBILINOGEN 0.2 07/11/2017 1108   NITRITE POSITIVE (A) 06/09/2023 2008   LEUKOCYTESUR LARGE (A) 06/09/2023 2008   Sepsis Labs Recent Labs  Lab 06/09/23 1728 06/10/23 0118 06/13/23 0145  WBC 7.7 6.6 5.2   Microbiology No results found for this or any previous visit (from the past 240 hour(s)).   Time coordinating discharge: Over 30 minutes  SIGNED:   Azucena Fallen, DO [facility] 06/15/2023, 10:25 AM Pager   If 7PM-7AM, please contact night-coverage [email or web address]

## 2023-06-15 NOTE — TOC Progression Note (Signed)
Transition of Care [facility]) - Progression Note    Patient Details  Name: Katelyn Jackson MRN: [id number] Date of Birth: 1946/09/16  Transition of Care [facility]) CM/SW Contact  Lorri Frederick, LCSW Phone Number: 06/15/2023, 10:32 AM  Clinical Narrative:   CSW confirmed with [facility] that they can receive pt today.      Expected Discharge Plan: Skilled Nursing Facility Barriers to Discharge: Continued Medical Work up, Other (must enter comment) (insurance auth)  Expected Discharge Plan and Services In-house Referral: Clinical Social Work   Post Acute Care Choice: Skilled Nursing Facility Living arrangements for the past 2 months: Single Family Home Expected Discharge Date: 06/15/23                                     Social Determinants of Health (SDOH) Interventions SDOH Screenings   Food Insecurity: No Food Insecurity (05/18/2023)  Housing: Patient Unable To Answer (05/18/2023)  Transportation Needs: No Transportation Needs (05/18/2023)  Utilities: Not At Risk (05/18/2023)  Alcohol Screen: Low Risk  (06/15/2022)  Depression (PHQ2-9): Low Risk  (04/17/2023)  Financial Resource Strain: Low Risk  (06/15/2022)  Physical Activity: Inactive (06/15/2022)  Social Connections: Socially Isolated (06/15/2022)  Stress: No Stress Concern Present (06/15/2022)  Tobacco Use: Low Risk  (06/09/2023)    Readmission Risk Interventions    07/06/2021   11:49 AM 03/19/2021    3:03 PM  Readmission Risk Prevention Plan  Transportation Screening Complete Complete  HRI or Home Care Consult  Complete  Social Work Consult for Recovery Care Planning/Counseling  Complete  Palliative Care Screening  Not Applicable  Medication Review Oceanographer)  Complete  HRI or Home Care Consult Complete   SW Recovery Care/Counseling Consult Complete   Palliative Care Screening Not Applicable   Skilled Nursing Facility Complete

## 2023-06-15 NOTE — TOC Transition Note (Signed)
Transition of Care [facility]) - CM/SW Discharge Note   Patient Details  Name: Katelyn Jackson MRN: [id number] Date of Birth: Mar 26, 1946  Transition [facility] Care [facility]) CM/SW Contact:  Lorri Frederick, LCSW Phone Number: 06/15/2023, 10:50 AM   Clinical Narrative:   Pt [facility] to [name], room [facility], [facility].  RN call report to [phone].    Final next level of care: Skilled Nursing Facility Barriers to Discharge: Barriers Resolved   Patient Goals and CMS Choice   Choice offered to / list presented to : Adult Children (son [name])  Discharge Placement                Patient [facility] at:  [name]) Patient to be transferred to facility by: [facility] Name of family member notified: son [name] Patient and family notified of of transfer: 06/15/23  Discharge Plan and Services Additional resources added to the After Visit Summary for   In-house Referral: Clinical Social Work   Post Acute Care Choice: Skilled Nursing Facility                               Social Determinants of Health (SDOH) Interventions SDOH Screenings   Food Insecurity: No Food Insecurity (05/18/2023)  Housing: Patient Unable To Answer (05/18/2023)  Transportation Needs: No Transportation Needs (05/18/2023)  Utilities: Not At Risk (05/18/2023)  Alcohol Screen: Low Risk  (06/15/2022)  Depression (PHQ2-9): Low Risk  (04/17/2023)  Financial Resource Strain: Low Risk  (06/15/2022)  Physical Activity: Inactive (06/15/2022)  Social Connections: Socially Isolated (06/15/2022)  Stress: No Stress Concern Present (06/15/2022)  Tobacco Use: Low Risk  (06/09/2023)     Readmission Risk Interventions    07/06/2021   11:49 AM 03/19/2021    3:03 PM  Readmission Risk Prevention Plan  Transportation Screening Complete Complete  HRI or Home Care Consult  Complete  Social Work Consult for Recovery Care Planning/Counseling  Complete  Palliative Care Screening  Not Applicable  Medication Review Furniture conservator/restorer)  Complete  HRI or Home Care Consult Complete   SW Recovery Care/Counseling Consult Complete   Palliative Care Screening Not Applicable   Skilled Nursing Facility Complete

## 2023-06-15 NOTE — Consult Note (Signed)
   [facility] CM Inpatient Consult   06/15/2023  [name] 04-Apr-1946 [id number]  [facility] [[facility]]  Accountable Care Organization [ACO] Patient: Katelyn Jackson SNP  Primary Care Provider:  Tresa Garter, MD with [place] [facility] is listed for the transition of care follow up in community.   Review of patient's medical record for disposition ad past medical history and membership affiliate roster reveals this patient is a [employer] Needs Program] member and will be followed with the [facility] Medicare assigned team member in that program.  Patient transitioned to a SNF level of care at [facility] noted and post hospital needs to be met at a SNF level of care for rehab.    For additional questions or referrals please contact:    Plan: Will sign off.    Charlesetta Shanks, RN BSN CCM [facility]  [phone] business mobile phone Toll free office [phone]  *Concierge Line  [phone] Fax number: [phone] [email or web address]

## 2023-06-19 DIAGNOSIS — R5381 Other malaise: Secondary | ICD-10-CM | POA: Diagnosis not present

## 2023-06-19 DIAGNOSIS — K5900 Constipation, unspecified: Secondary | ICD-10-CM | POA: Diagnosis not present

## 2023-06-19 DIAGNOSIS — S42033A Displaced fracture of lateral end of unspecified clavicle, initial encounter for closed fracture: Secondary | ICD-10-CM | POA: Diagnosis not present

## 2023-06-19 DIAGNOSIS — G8929 Other chronic pain: Secondary | ICD-10-CM | POA: Diagnosis not present

## 2023-06-19 DIAGNOSIS — E785 Hyperlipidemia, unspecified: Secondary | ICD-10-CM | POA: Diagnosis not present

## 2023-06-19 DIAGNOSIS — R296 Repeated falls: Secondary | ICD-10-CM | POA: Diagnosis not present

## 2023-06-19 DIAGNOSIS — I10 Essential (primary) hypertension: Secondary | ICD-10-CM | POA: Diagnosis not present

## 2023-06-19 DIAGNOSIS — I509 Heart failure, unspecified: Secondary | ICD-10-CM | POA: Diagnosis not present

## 2023-06-19 DIAGNOSIS — S82002A Unspecified fracture of left patella, initial encounter for closed fracture: Secondary | ICD-10-CM | POA: Diagnosis not present

## 2023-06-20 DIAGNOSIS — R296 Repeated falls: Secondary | ICD-10-CM | POA: Diagnosis not present

## 2023-06-20 DIAGNOSIS — S82002D Unspecified fracture of left patella, subsequent encounter for closed fracture with routine healing: Secondary | ICD-10-CM | POA: Diagnosis not present

## 2023-06-20 DIAGNOSIS — G8929 Other chronic pain: Secondary | ICD-10-CM | POA: Diagnosis not present

## 2023-06-20 DIAGNOSIS — K5900 Constipation, unspecified: Secondary | ICD-10-CM | POA: Diagnosis not present

## 2023-06-20 DIAGNOSIS — S82002A Unspecified fracture of left patella, initial encounter for closed fracture: Secondary | ICD-10-CM | POA: Diagnosis not present

## 2023-06-20 DIAGNOSIS — S42033A Displaced fracture of lateral end of unspecified clavicle, initial encounter for closed fracture: Secondary | ICD-10-CM | POA: Diagnosis not present

## 2023-06-20 DIAGNOSIS — R5381 Other malaise: Secondary | ICD-10-CM | POA: Diagnosis not present

## 2023-06-20 DIAGNOSIS — I4891 Unspecified atrial fibrillation: Secondary | ICD-10-CM | POA: Diagnosis not present

## 2023-06-20 DIAGNOSIS — S42002D Fracture of unspecified part of left clavicle, subsequent encounter for fracture with routine healing: Secondary | ICD-10-CM | POA: Diagnosis not present

## 2023-06-20 DIAGNOSIS — N1832 Chronic kidney disease, stage 3b: Secondary | ICD-10-CM | POA: Diagnosis not present

## 2023-06-20 DIAGNOSIS — D638 Anemia in other chronic diseases classified elsewhere: Secondary | ICD-10-CM | POA: Diagnosis not present

## 2023-06-20 DIAGNOSIS — M109 Gout, unspecified: Secondary | ICD-10-CM | POA: Diagnosis not present

## 2023-06-21 DIAGNOSIS — S82012A Displaced osteochondral fracture of left patella, initial encounter for closed fracture: Secondary | ICD-10-CM | POA: Diagnosis not present

## 2023-06-21 DIAGNOSIS — S42002A Fracture of unspecified part of left clavicle, initial encounter for closed fracture: Secondary | ICD-10-CM | POA: Insufficient documentation

## 2023-06-21 DIAGNOSIS — S42035A Nondisplaced fracture of lateral end of left clavicle, initial encounter for closed fracture: Secondary | ICD-10-CM | POA: Diagnosis not present

## 2023-06-22 DIAGNOSIS — S82002A Unspecified fracture of left patella, initial encounter for closed fracture: Secondary | ICD-10-CM | POA: Diagnosis not present

## 2023-06-22 DIAGNOSIS — I4891 Unspecified atrial fibrillation: Secondary | ICD-10-CM | POA: Diagnosis not present

## 2023-06-22 DIAGNOSIS — S42033A Displaced fracture of lateral end of unspecified clavicle, initial encounter for closed fracture: Secondary | ICD-10-CM | POA: Diagnosis not present

## 2023-06-22 DIAGNOSIS — M109 Gout, unspecified: Secondary | ICD-10-CM | POA: Diagnosis not present

## 2023-06-22 DIAGNOSIS — G8929 Other chronic pain: Secondary | ICD-10-CM | POA: Diagnosis not present

## 2023-06-22 DIAGNOSIS — N1832 Chronic kidney disease, stage 3b: Secondary | ICD-10-CM | POA: Diagnosis not present

## 2023-06-22 DIAGNOSIS — R296 Repeated falls: Secondary | ICD-10-CM | POA: Diagnosis not present

## 2023-06-22 DIAGNOSIS — K5900 Constipation, unspecified: Secondary | ICD-10-CM | POA: Diagnosis not present

## 2023-06-22 DIAGNOSIS — R5381 Other malaise: Secondary | ICD-10-CM | POA: Diagnosis not present

## 2023-06-26 DIAGNOSIS — S42035A Nondisplaced fracture of lateral end of left clavicle, initial encounter for closed fracture: Secondary | ICD-10-CM | POA: Diagnosis not present

## 2023-06-26 DIAGNOSIS — D649 Anemia, unspecified: Secondary | ICD-10-CM | POA: Diagnosis not present

## 2023-06-26 DIAGNOSIS — L89892 Pressure ulcer of other site, stage 2: Secondary | ICD-10-CM | POA: Diagnosis not present

## 2023-06-26 DIAGNOSIS — I5032 Chronic diastolic (congestive) heart failure: Secondary | ICD-10-CM | POA: Diagnosis not present

## 2023-06-26 DIAGNOSIS — S82012A Displaced osteochondral fracture of left patella, initial encounter for closed fracture: Secondary | ICD-10-CM | POA: Diagnosis not present

## 2023-06-26 DIAGNOSIS — K7460 Unspecified cirrhosis of liver: Secondary | ICD-10-CM | POA: Diagnosis not present

## 2023-06-28 DIAGNOSIS — F4321 Adjustment disorder with depressed mood: Secondary | ICD-10-CM | POA: Diagnosis not present

## 2023-06-29 DIAGNOSIS — N1832 Chronic kidney disease, stage 3b: Secondary | ICD-10-CM | POA: Diagnosis not present

## 2023-06-29 DIAGNOSIS — K7581 Nonalcoholic steatohepatitis (NASH): Secondary | ICD-10-CM | POA: Diagnosis not present

## 2023-06-29 DIAGNOSIS — K5900 Constipation, unspecified: Secondary | ICD-10-CM | POA: Diagnosis not present

## 2023-06-29 DIAGNOSIS — M109 Gout, unspecified: Secondary | ICD-10-CM | POA: Diagnosis not present

## 2023-06-29 DIAGNOSIS — S82002A Unspecified fracture of left patella, initial encounter for closed fracture: Secondary | ICD-10-CM | POA: Diagnosis not present

## 2023-06-29 DIAGNOSIS — I10 Essential (primary) hypertension: Secondary | ICD-10-CM | POA: Diagnosis not present

## 2023-06-29 DIAGNOSIS — G8929 Other chronic pain: Secondary | ICD-10-CM | POA: Diagnosis not present

## 2023-06-29 DIAGNOSIS — R5381 Other malaise: Secondary | ICD-10-CM | POA: Diagnosis not present

## 2023-06-29 DIAGNOSIS — S42033A Displaced fracture of lateral end of unspecified clavicle, initial encounter for closed fracture: Secondary | ICD-10-CM | POA: Diagnosis not present

## 2023-07-06 DIAGNOSIS — I4891 Unspecified atrial fibrillation: Secondary | ICD-10-CM | POA: Diagnosis not present

## 2023-07-06 DIAGNOSIS — I10 Essential (primary) hypertension: Secondary | ICD-10-CM | POA: Diagnosis not present

## 2023-07-06 DIAGNOSIS — S42033A Displaced fracture of lateral end of unspecified clavicle, initial encounter for closed fracture: Secondary | ICD-10-CM | POA: Diagnosis not present

## 2023-07-06 DIAGNOSIS — R5381 Other malaise: Secondary | ICD-10-CM | POA: Diagnosis not present

## 2023-07-06 DIAGNOSIS — S82002A Unspecified fracture of left patella, initial encounter for closed fracture: Secondary | ICD-10-CM | POA: Diagnosis not present

## 2023-07-06 DIAGNOSIS — K5900 Constipation, unspecified: Secondary | ICD-10-CM | POA: Diagnosis not present

## 2023-07-06 DIAGNOSIS — E039 Hypothyroidism, unspecified: Secondary | ICD-10-CM | POA: Diagnosis not present

## 2023-07-06 DIAGNOSIS — N1832 Chronic kidney disease, stage 3b: Secondary | ICD-10-CM | POA: Diagnosis not present

## 2023-07-06 DIAGNOSIS — G8929 Other chronic pain: Secondary | ICD-10-CM | POA: Diagnosis not present

## 2023-07-18 DIAGNOSIS — I4891 Unspecified atrial fibrillation: Secondary | ICD-10-CM | POA: Diagnosis not present

## 2023-07-18 DIAGNOSIS — M109 Gout, unspecified: Secondary | ICD-10-CM | POA: Diagnosis not present

## 2023-07-18 DIAGNOSIS — S82002A Unspecified fracture of left patella, initial encounter for closed fracture: Secondary | ICD-10-CM | POA: Diagnosis not present

## 2023-07-18 DIAGNOSIS — R5381 Other malaise: Secondary | ICD-10-CM | POA: Diagnosis not present

## 2023-07-18 DIAGNOSIS — K5900 Constipation, unspecified: Secondary | ICD-10-CM | POA: Diagnosis not present

## 2023-07-18 DIAGNOSIS — R339 Retention of urine, unspecified: Secondary | ICD-10-CM | POA: Diagnosis not present

## 2023-07-18 DIAGNOSIS — S42033A Displaced fracture of lateral end of unspecified clavicle, initial encounter for closed fracture: Secondary | ICD-10-CM | POA: Diagnosis not present

## 2023-07-18 DIAGNOSIS — G8929 Other chronic pain: Secondary | ICD-10-CM | POA: Diagnosis not present

## 2023-07-18 DIAGNOSIS — N1832 Chronic kidney disease, stage 3b: Secondary | ICD-10-CM | POA: Diagnosis not present

## 2023-07-18 DIAGNOSIS — E039 Hypothyroidism, unspecified: Secondary | ICD-10-CM | POA: Diagnosis not present

## 2023-08-03 DIAGNOSIS — E039 Hypothyroidism, unspecified: Secondary | ICD-10-CM | POA: Diagnosis not present

## 2023-08-03 DIAGNOSIS — R5381 Other malaise: Secondary | ICD-10-CM | POA: Diagnosis not present

## 2023-08-03 DIAGNOSIS — N1832 Chronic kidney disease, stage 3b: Secondary | ICD-10-CM | POA: Diagnosis not present

## 2023-08-03 DIAGNOSIS — S42033A Displaced fracture of lateral end of unspecified clavicle, initial encounter for closed fracture: Secondary | ICD-10-CM | POA: Diagnosis not present

## 2023-08-03 DIAGNOSIS — K5900 Constipation, unspecified: Secondary | ICD-10-CM | POA: Diagnosis not present

## 2023-08-03 DIAGNOSIS — E785 Hyperlipidemia, unspecified: Secondary | ICD-10-CM | POA: Diagnosis not present

## 2023-08-03 DIAGNOSIS — I4891 Unspecified atrial fibrillation: Secondary | ICD-10-CM | POA: Diagnosis not present

## 2023-08-03 DIAGNOSIS — M109 Gout, unspecified: Secondary | ICD-10-CM | POA: Diagnosis not present

## 2023-08-03 DIAGNOSIS — I10 Essential (primary) hypertension: Secondary | ICD-10-CM | POA: Diagnosis not present

## 2023-08-14 DIAGNOSIS — R5381 Other malaise: Secondary | ICD-10-CM | POA: Diagnosis not present

## 2023-08-14 DIAGNOSIS — S82002A Unspecified fracture of left patella, initial encounter for closed fracture: Secondary | ICD-10-CM | POA: Diagnosis not present

## 2023-08-14 DIAGNOSIS — I10 Essential (primary) hypertension: Secondary | ICD-10-CM | POA: Diagnosis not present

## 2023-08-14 DIAGNOSIS — K5900 Constipation, unspecified: Secondary | ICD-10-CM | POA: Diagnosis not present

## 2023-08-14 DIAGNOSIS — I4891 Unspecified atrial fibrillation: Secondary | ICD-10-CM | POA: Diagnosis not present

## 2023-08-14 DIAGNOSIS — G8929 Other chronic pain: Secondary | ICD-10-CM | POA: Diagnosis not present

## 2023-08-14 DIAGNOSIS — M109 Gout, unspecified: Secondary | ICD-10-CM | POA: Diagnosis not present

## 2023-08-14 DIAGNOSIS — S42033A Displaced fracture of lateral end of unspecified clavicle, initial encounter for closed fracture: Secondary | ICD-10-CM | POA: Diagnosis not present

## 2023-08-14 DIAGNOSIS — N1832 Chronic kidney disease, stage 3b: Secondary | ICD-10-CM | POA: Diagnosis not present

## 2023-08-17 DIAGNOSIS — M109 Gout, unspecified: Secondary | ICD-10-CM | POA: Diagnosis not present

## 2023-08-17 DIAGNOSIS — E785 Hyperlipidemia, unspecified: Secondary | ICD-10-CM | POA: Diagnosis not present

## 2023-08-17 DIAGNOSIS — I10 Essential (primary) hypertension: Secondary | ICD-10-CM | POA: Diagnosis not present

## 2023-08-17 DIAGNOSIS — R5381 Other malaise: Secondary | ICD-10-CM | POA: Diagnosis not present

## 2023-08-17 DIAGNOSIS — K5900 Constipation, unspecified: Secondary | ICD-10-CM | POA: Diagnosis not present

## 2023-08-17 DIAGNOSIS — N1832 Chronic kidney disease, stage 3b: Secondary | ICD-10-CM | POA: Diagnosis not present

## 2023-08-17 DIAGNOSIS — R339 Retention of urine, unspecified: Secondary | ICD-10-CM | POA: Diagnosis not present

## 2023-08-17 DIAGNOSIS — G8929 Other chronic pain: Secondary | ICD-10-CM | POA: Diagnosis not present

## 2023-08-17 DIAGNOSIS — I4891 Unspecified atrial fibrillation: Secondary | ICD-10-CM | POA: Diagnosis not present

## 2023-08-18 DIAGNOSIS — S82012D Displaced osteochondral fracture of left patella, subsequent encounter for closed fracture with routine healing: Secondary | ICD-10-CM | POA: Diagnosis not present

## 2023-08-18 DIAGNOSIS — M069 Rheumatoid arthritis, unspecified: Secondary | ICD-10-CM | POA: Diagnosis not present

## 2023-08-18 DIAGNOSIS — S42035D Nondisplaced fracture of lateral end of left clavicle, subsequent encounter for fracture with routine healing: Secondary | ICD-10-CM | POA: Diagnosis not present

## 2023-08-20 ENCOUNTER — Emergency Department (HOSPITAL_COMMUNITY): Payer: Medicare HMO

## 2023-08-20 ENCOUNTER — Other Ambulatory Visit: Payer: Self-pay

## 2023-08-20 ENCOUNTER — Inpatient Hospital Stay (HOSPITAL_COMMUNITY)
Admission: EM | Admit: 2023-08-20 | Discharge: 2023-08-28 | DRG: 690 | Disposition: A | Discharge status: Skilled Nursing Facility | Payer: Medicare HMO | Attending: Internal Medicine | Admitting: Internal Medicine

## 2023-08-20 DIAGNOSIS — M5021 Other cervical disc displacement,  high cervical region: Secondary | ICD-10-CM | POA: Diagnosis not present

## 2023-08-20 DIAGNOSIS — R5381 Other malaise: Secondary | ICD-10-CM | POA: Diagnosis present

## 2023-08-20 DIAGNOSIS — K7460 Unspecified cirrhosis of liver: Secondary | ICD-10-CM | POA: Diagnosis present

## 2023-08-20 DIAGNOSIS — B9629 Other Escherichia coli [E. coli] as the cause of diseases classified elsewhere: Secondary | ICD-10-CM | POA: Diagnosis not present

## 2023-08-20 DIAGNOSIS — R195 Other fecal abnormalities: Secondary | ICD-10-CM | POA: Diagnosis not present

## 2023-08-20 DIAGNOSIS — I4821 Permanent atrial fibrillation: Secondary | ICD-10-CM | POA: Diagnosis present

## 2023-08-20 DIAGNOSIS — Z8701 Personal history of pneumonia (recurrent): Secondary | ICD-10-CM

## 2023-08-20 DIAGNOSIS — Z79899 Other long term (current) drug therapy: Secondary | ICD-10-CM

## 2023-08-20 DIAGNOSIS — M1711 Unilateral primary osteoarthritis, right knee: Secondary | ICD-10-CM | POA: Diagnosis present

## 2023-08-20 DIAGNOSIS — D509 Iron deficiency anemia, unspecified: Secondary | ICD-10-CM | POA: Diagnosis present

## 2023-08-20 DIAGNOSIS — Z880 Allergy status to penicillin: Secondary | ICD-10-CM

## 2023-08-20 DIAGNOSIS — Z961 Presence of intraocular lens: Secondary | ICD-10-CM | POA: Diagnosis present

## 2023-08-20 DIAGNOSIS — R531 Weakness: Secondary | ICD-10-CM | POA: Diagnosis present

## 2023-08-20 DIAGNOSIS — I9589 Other hypotension: Secondary | ICD-10-CM | POA: Diagnosis present

## 2023-08-20 DIAGNOSIS — N1832 Chronic kidney disease, stage 3b: Secondary | ICD-10-CM | POA: Diagnosis present

## 2023-08-20 DIAGNOSIS — K2960 Other gastritis without bleeding: Secondary | ICD-10-CM | POA: Diagnosis not present

## 2023-08-20 DIAGNOSIS — Z79890 Hormone replacement therapy (postmenopausal): Secondary | ICD-10-CM | POA: Diagnosis not present

## 2023-08-20 DIAGNOSIS — I472 Ventricular tachycardia, unspecified: Secondary | ICD-10-CM | POA: Diagnosis not present

## 2023-08-20 DIAGNOSIS — Z792 Long term (current) use of antibiotics: Secondary | ICD-10-CM

## 2023-08-20 DIAGNOSIS — Z4789 Encounter for other orthopedic aftercare: Secondary | ICD-10-CM | POA: Diagnosis not present

## 2023-08-20 DIAGNOSIS — K219 Gastro-esophageal reflux disease without esophagitis: Secondary | ICD-10-CM | POA: Diagnosis present

## 2023-08-20 DIAGNOSIS — I5022 Chronic systolic (congestive) heart failure: Secondary | ICD-10-CM | POA: Diagnosis present

## 2023-08-20 DIAGNOSIS — I517 Cardiomegaly: Secondary | ICD-10-CM | POA: Diagnosis not present

## 2023-08-20 DIAGNOSIS — N179 Acute kidney failure, unspecified: Secondary | ICD-10-CM | POA: Diagnosis present

## 2023-08-20 DIAGNOSIS — I6782 Cerebral ischemia: Secondary | ICD-10-CM | POA: Diagnosis not present

## 2023-08-20 DIAGNOSIS — N3000 Acute cystitis without hematuria: Principal | ICD-10-CM | POA: Diagnosis present

## 2023-08-20 DIAGNOSIS — M4802 Spinal stenosis, cervical region: Secondary | ICD-10-CM | POA: Diagnosis present

## 2023-08-20 DIAGNOSIS — Z7901 Long term (current) use of anticoagulants: Secondary | ICD-10-CM

## 2023-08-20 DIAGNOSIS — I5031 Acute diastolic (congestive) heart failure: Secondary | ICD-10-CM | POA: Diagnosis not present

## 2023-08-20 DIAGNOSIS — Z883 Allergy status to other anti-infective agents status: Secondary | ICD-10-CM

## 2023-08-20 DIAGNOSIS — Z950 Presence of cardiac pacemaker: Secondary | ICD-10-CM

## 2023-08-20 DIAGNOSIS — M25512 Pain in left shoulder: Secondary | ICD-10-CM | POA: Diagnosis present

## 2023-08-20 DIAGNOSIS — B961 Klebsiella pneumoniae [K. pneumoniae] as the cause of diseases classified elsewhere: Secondary | ICD-10-CM | POA: Diagnosis present

## 2023-08-20 DIAGNOSIS — M25562 Pain in left knee: Secondary | ICD-10-CM | POA: Diagnosis present

## 2023-08-20 DIAGNOSIS — Z993 Dependence on wheelchair: Secondary | ICD-10-CM

## 2023-08-20 DIAGNOSIS — Z043 Encounter for examination and observation following other accident: Secondary | ICD-10-CM | POA: Diagnosis not present

## 2023-08-20 DIAGNOSIS — M5416 Radiculopathy, lumbar region: Secondary | ICD-10-CM | POA: Diagnosis present

## 2023-08-20 DIAGNOSIS — I272 Pulmonary hypertension, unspecified: Secondary | ICD-10-CM | POA: Diagnosis present

## 2023-08-20 DIAGNOSIS — Z66 Do not resuscitate: Secondary | ICD-10-CM | POA: Diagnosis present

## 2023-08-20 DIAGNOSIS — Z923 Personal history of irradiation: Secondary | ICD-10-CM | POA: Diagnosis not present

## 2023-08-20 DIAGNOSIS — R001 Bradycardia, unspecified: Secondary | ICD-10-CM | POA: Diagnosis present

## 2023-08-20 DIAGNOSIS — M170 Bilateral primary osteoarthritis of knee: Secondary | ICD-10-CM | POA: Diagnosis not present

## 2023-08-20 DIAGNOSIS — D638 Anemia in other chronic diseases classified elsewhere: Secondary | ICD-10-CM | POA: Diagnosis present

## 2023-08-20 DIAGNOSIS — R200 Anesthesia of skin: Secondary | ICD-10-CM | POA: Diagnosis present

## 2023-08-20 DIAGNOSIS — I5032 Chronic diastolic (congestive) heart failure: Secondary | ICD-10-CM | POA: Diagnosis present

## 2023-08-20 DIAGNOSIS — I10 Essential (primary) hypertension: Secondary | ICD-10-CM | POA: Diagnosis present

## 2023-08-20 DIAGNOSIS — R2990 Unspecified symptoms and signs involving the nervous system: Secondary | ICD-10-CM | POA: Diagnosis not present

## 2023-08-20 DIAGNOSIS — M19012 Primary osteoarthritis, left shoulder: Secondary | ICD-10-CM | POA: Diagnosis not present

## 2023-08-20 DIAGNOSIS — I890 Lymphedema, not elsewhere classified: Secondary | ICD-10-CM | POA: Diagnosis present

## 2023-08-20 DIAGNOSIS — M109 Gout, unspecified: Secondary | ICD-10-CM | POA: Diagnosis present

## 2023-08-20 DIAGNOSIS — M791 Myalgia, unspecified site: Secondary | ICD-10-CM

## 2023-08-20 DIAGNOSIS — K3189 Other diseases of stomach and duodenum: Secondary | ICD-10-CM | POA: Diagnosis not present

## 2023-08-20 DIAGNOSIS — W1830XA Fall on same level, unspecified, initial encounter: Secondary | ICD-10-CM | POA: Diagnosis present

## 2023-08-20 DIAGNOSIS — D649 Anemia, unspecified: Secondary | ICD-10-CM | POA: Diagnosis not present

## 2023-08-20 DIAGNOSIS — E538 Deficiency of other specified B group vitamins: Secondary | ICD-10-CM | POA: Diagnosis present

## 2023-08-20 DIAGNOSIS — K449 Diaphragmatic hernia without obstruction or gangrene: Secondary | ICD-10-CM | POA: Diagnosis present

## 2023-08-20 DIAGNOSIS — I482 Chronic atrial fibrillation, unspecified: Secondary | ICD-10-CM | POA: Diagnosis present

## 2023-08-20 DIAGNOSIS — Z9181 History of falling: Secondary | ICD-10-CM

## 2023-08-20 DIAGNOSIS — S80919A Unspecified superficial injury of unspecified knee, initial encounter: Secondary | ICD-10-CM | POA: Diagnosis not present

## 2023-08-20 DIAGNOSIS — Z7401 Bed confinement status: Secondary | ICD-10-CM | POA: Diagnosis not present

## 2023-08-20 DIAGNOSIS — Z1612 Extended spectrum beta lactamase (ESBL) resistance: Secondary | ICD-10-CM | POA: Diagnosis present

## 2023-08-20 DIAGNOSIS — Z6838 Body mass index (BMI) 38.0-38.9, adult: Secondary | ICD-10-CM

## 2023-08-20 DIAGNOSIS — K2970 Gastritis, unspecified, without bleeding: Secondary | ICD-10-CM | POA: Diagnosis not present

## 2023-08-20 DIAGNOSIS — R29818 Other symptoms and signs involving the nervous system: Secondary | ICD-10-CM | POA: Diagnosis not present

## 2023-08-20 DIAGNOSIS — D539 Nutritional anemia, unspecified: Secondary | ICD-10-CM | POA: Diagnosis present

## 2023-08-20 DIAGNOSIS — N139 Obstructive and reflux uropathy, unspecified: Secondary | ICD-10-CM | POA: Diagnosis present

## 2023-08-20 DIAGNOSIS — M25551 Pain in right hip: Secondary | ICD-10-CM | POA: Diagnosis not present

## 2023-08-20 DIAGNOSIS — Z9841 Cataract extraction status, right eye: Secondary | ICD-10-CM

## 2023-08-20 DIAGNOSIS — R110 Nausea: Secondary | ICD-10-CM | POA: Diagnosis not present

## 2023-08-20 DIAGNOSIS — I130 Hypertensive heart and chronic kidney disease with heart failure and stage 1 through stage 4 chronic kidney disease, or unspecified chronic kidney disease: Secondary | ICD-10-CM | POA: Diagnosis present

## 2023-08-20 DIAGNOSIS — W19XXXA Unspecified fall, initial encounter: Principal | ICD-10-CM

## 2023-08-20 DIAGNOSIS — G459 Transient cerebral ischemic attack, unspecified: Secondary | ICD-10-CM | POA: Diagnosis not present

## 2023-08-20 DIAGNOSIS — D631 Anemia in chronic kidney disease: Secondary | ICD-10-CM | POA: Diagnosis present

## 2023-08-20 DIAGNOSIS — E785 Hyperlipidemia, unspecified: Secondary | ICD-10-CM | POA: Diagnosis present

## 2023-08-20 DIAGNOSIS — M47816 Spondylosis without myelopathy or radiculopathy, lumbar region: Secondary | ICD-10-CM | POA: Diagnosis not present

## 2023-08-20 DIAGNOSIS — Y92009 Unspecified place in unspecified non-institutional (private) residence as the place of occurrence of the external cause: Secondary | ICD-10-CM | POA: Diagnosis not present

## 2023-08-20 DIAGNOSIS — R262 Difficulty in walking, not elsewhere classified: Secondary | ICD-10-CM | POA: Diagnosis present

## 2023-08-20 DIAGNOSIS — M4807 Spinal stenosis, lumbosacral region: Secondary | ICD-10-CM | POA: Diagnosis not present

## 2023-08-20 DIAGNOSIS — S199XXA Unspecified injury of neck, initial encounter: Secondary | ICD-10-CM | POA: Diagnosis not present

## 2023-08-20 DIAGNOSIS — E876 Hypokalemia: Secondary | ICD-10-CM | POA: Diagnosis present

## 2023-08-20 DIAGNOSIS — Z8249 Family history of ischemic heart disease and other diseases of the circulatory system: Secondary | ICD-10-CM

## 2023-08-20 DIAGNOSIS — R918 Other nonspecific abnormal finding of lung field: Secondary | ICD-10-CM | POA: Diagnosis not present

## 2023-08-20 DIAGNOSIS — S42201A Unspecified fracture of upper end of right humerus, initial encounter for closed fracture: Secondary | ICD-10-CM | POA: Diagnosis not present

## 2023-08-20 DIAGNOSIS — M47814 Spondylosis without myelopathy or radiculopathy, thoracic region: Secondary | ICD-10-CM | POA: Diagnosis not present

## 2023-08-20 DIAGNOSIS — Z466 Encounter for fitting and adjustment of urinary device: Secondary | ICD-10-CM | POA: Diagnosis not present

## 2023-08-20 DIAGNOSIS — Z853 Personal history of malignant neoplasm of breast: Secondary | ICD-10-CM

## 2023-08-20 DIAGNOSIS — D500 Iron deficiency anemia secondary to blood loss (chronic): Secondary | ICD-10-CM | POA: Diagnosis not present

## 2023-08-20 DIAGNOSIS — E039 Hypothyroidism, unspecified: Secondary | ICD-10-CM | POA: Diagnosis present

## 2023-08-20 DIAGNOSIS — M21371 Foot drop, right foot: Secondary | ICD-10-CM | POA: Diagnosis present

## 2023-08-20 DIAGNOSIS — I959 Hypotension, unspecified: Secondary | ICD-10-CM | POA: Diagnosis not present

## 2023-08-20 DIAGNOSIS — S0990XA Unspecified injury of head, initial encounter: Secondary | ICD-10-CM | POA: Diagnosis not present

## 2023-08-20 DIAGNOSIS — E66812 Obesity, class 2: Secondary | ICD-10-CM | POA: Diagnosis present

## 2023-08-20 DIAGNOSIS — R8290 Unspecified abnormal findings in urine: Secondary | ICD-10-CM | POA: Diagnosis present

## 2023-08-20 DIAGNOSIS — K31A0 Gastric intestinal metaplasia, unspecified: Secondary | ICD-10-CM | POA: Diagnosis not present

## 2023-08-20 DIAGNOSIS — W19XXXS Unspecified fall, sequela: Secondary | ICD-10-CM | POA: Diagnosis not present

## 2023-08-20 DIAGNOSIS — R29810 Facial weakness: Secondary | ICD-10-CM | POA: Diagnosis not present

## 2023-08-20 DIAGNOSIS — M50222 Other cervical disc displacement at C5-C6 level: Secondary | ICD-10-CM | POA: Diagnosis not present

## 2023-08-20 DIAGNOSIS — Z9842 Cataract extraction status, left eye: Secondary | ICD-10-CM

## 2023-08-20 DIAGNOSIS — R296 Repeated falls: Secondary | ICD-10-CM | POA: Diagnosis not present

## 2023-08-20 LAB — I-STAT CG4 LACTIC ACID, ED: Lactic Acid, Venous: 0.9 mmol/L (ref 0.5–1.9)

## 2023-08-20 LAB — URINALYSIS, W/ REFLEX TO CULTURE (INFECTION SUSPECTED)
Bilirubin Urine: NEGATIVE
Glucose, UA: NEGATIVE mg/dL
Ketones, ur: NEGATIVE mg/dL
Nitrite: NEGATIVE
Protein, ur: 30 mg/dL — AB
Specific Gravity, Urine: 1.01 (ref 1.005–1.030)
WBC, UA: >50 WBC/hpf (ref 0–5)
pH: 6 (ref 5.0–8.0)

## 2023-08-20 LAB — CBC WITH DIFFERENTIAL/PLATELET
Abs Immature Granulocytes: 0.03 10*3/uL (ref 0.00–0.07)
Basophils Absolute: 0 10*3/uL (ref 0.0–0.1)
Basophils Relative: 0 %
Eosinophils Absolute: 0.1 10*3/uL (ref 0.0–0.5)
Eosinophils Relative: 1 %
HCT: 30.2 % — ABNORMAL LOW (ref 36.0–46.0)
Hemoglobin: 9.3 g/dL — ABNORMAL LOW (ref 12.0–15.0)
Immature Granulocytes: 0 %
Lymphocytes Relative: 15 %
Lymphs Abs: 1.1 10*3/uL (ref 0.7–4.0)
MCH: 30.4 pg (ref 26.0–34.0)
MCHC: 30.8 g/dL (ref 30.0–36.0)
MCV: 98.7 fL (ref 80.0–100.0)
Monocytes Absolute: 0.8 10*3/uL (ref 0.1–1.0)
Monocytes Relative: 10 %
Neutro Abs: 5.7 10*3/uL (ref 1.7–7.7)
Neutrophils Relative %: 74 %
Platelets: 143 10*3/uL — ABNORMAL LOW (ref 150–400)
RBC: 3.06 MIL/uL — ABNORMAL LOW (ref 3.87–5.11)
RDW: 14.5 % (ref 11.5–15.5)
WBC: 7.7 10*3/uL (ref 4.0–10.5)
nRBC: 0 % (ref 0.0–0.2)

## 2023-08-20 LAB — PROTIME-INR
INR: 1.4 — ABNORMAL HIGH (ref 0.8–1.2)
Prothrombin Time: 17.6 s — ABNORMAL HIGH (ref 11.4–15.2)

## 2023-08-20 LAB — COMPREHENSIVE METABOLIC PANEL
ALT: 12 U/L (ref 0–44)
AST: 17 U/L (ref 15–41)
Albumin: 3.2 g/dL — ABNORMAL LOW (ref 3.5–5.0)
Alkaline Phosphatase: 129 U/L — ABNORMAL HIGH (ref 38–126)
Anion gap: 13 (ref 5–15)
BUN: 44 mg/dL — ABNORMAL HIGH (ref 8–23)
CO2: 27 mmol/L (ref 22–32)
Calcium: 9 mg/dL (ref 8.9–10.3)
Chloride: 101 mmol/L (ref 98–111)
Creatinine, Ser: 2.04 mg/dL — ABNORMAL HIGH (ref 0.44–1.00)
GFR, Estimated: 25 mL/min — ABNORMAL LOW (ref >60)
Glucose, Bld: 117 mg/dL — ABNORMAL HIGH (ref 70–99)
Potassium: 3.5 mmol/L (ref 3.5–5.1)
Sodium: 141 mmol/L (ref 135–145)
Total Bilirubin: 0.9 mg/dL (ref 0.3–1.2)
Total Protein: 6.6 g/dL (ref 6.5–8.1)

## 2023-08-20 LAB — APTT: aPTT: 30 s (ref 24–36)

## 2023-08-20 MED ORDER — LACTATED RINGERS IV BOLUS (SEPSIS)
1000.0000 mL | Freq: Once | INTRAVENOUS | Status: AC
  Administered 2023-08-20: 1000 mL via INTRAVENOUS

## 2023-08-20 MED ORDER — FENTANYL CITRATE PF 50 MCG/ML IJ SOSY
50.0000 ug | PREFILLED_SYRINGE | Freq: Once | INTRAMUSCULAR | Status: AC
  Administered 2023-08-20: 50 ug via INTRAVENOUS
  Filled 2023-08-20: qty 1

## 2023-08-20 MED ORDER — SODIUM CHLORIDE 0.9 % IV SOLN
1.0000 g | Freq: Once | INTRAVENOUS | Status: DC
  Filled 2023-08-20: qty 10

## 2023-08-20 NOTE — ED Provider Notes (Signed)
[place] EMERGENCY DEPARTMENT AT [facility] Provider Note   CSN: [id number] Arrival date & time: 08/20/23  2030     History Chief Complaint  Patient presents with   Katelyn Jackson    Katelyn Jackson is a 77 y.o. female.  Patient with significant morbidities including CKD stage IIIa, morbid obesity, chronic heart failure, pulmonary hypertension, atrial fibrillation, hypertension who presents emergency department following a reported fall.  Patient last known normal approximately 2 days ago.  Patient reportedly had a fall with a head strike and currently is on blood thinners.  Family concerned about possible right-sided foot drop as well as right-sided facial droop that started 2 days ago.  Falls suspected to be due to poor ability to ambulate given recent progression of right foot drop.  EMS concern for possible sepsis as patient feels hot to the touch although not febrile and patient "smells like UTI".  Patient recently seen in the emergency department admitted on 06/09/2023 for fecal impaction, hypokalemia  HPI     Home Medications Prior to Admission medications   Medication Sig Start Date End Date Taking? Authorizing Provider  allopurinol (ZYLOPRIM) 100 MG tablet Take 0.5 tablets (50 mg total) by mouth daily. 11/23/22  Yes Plotnikov, Georgina Quint, MD  apixaban (ELIQUIS) 5 MG TABS tablet Take 1 tablet (5 mg total) by mouth 2 (two) times daily. 11/23/22  Yes Plotnikov, Georgina Quint, MD  carvedilol (COREG) 12.5 MG tablet Take 12.5 mg by mouth 2 (two) times daily with a meal.   Yes [provider]  bisacodyl (DULCOLAX) 10 MG suppository Place 10 mg rectally once as needed (constipation).    [provider]  lactulose (CHRONULAC) 10 GM/15ML solution TAKE 15 MILLILITERS BY MOUTH THREE TIMESDAILY Patient taking differently: Take 45 mLs by mouth 3 (three) times daily. 02/10/23   Plotnikov, Georgina Quint, MD  levothyroxine (SYNTHROID) 50 MCG tablet Take 1 tablet (50 mcg total) by mouth  daily before breakfast. Overdue for Annual appt must see provider for future refills 11/23/22   Plotnikov, Georgina Quint, MD  lovastatin (MEVACOR) 20 MG tablet Take 20 mg by mouth at bedtime.    [provider]  magnesium oxide (MAG-OX) 400 MG tablet Take 1 tablet (400 mg total) by mouth 2 (two) times daily. 09/15/22   Plotnikov, Georgina Quint, MD  Multiple Vitamins-Minerals (DECUBI-VITE) CAPS Take 1 capsule by mouth daily.    [provider]  Nystatin (GERHARDT'S BUTT CREAM) CREA Apply 1 Application topically 2 (two) times daily. 06/15/23   Azucena Fallen, MD  omeprazole (PRILOSEC) 40 MG capsule TAKE ONE CAPSULE BY MOUTH DAILY Patient taking differently: Take 40 mg by mouth daily before breakfast. 08/16/22   Plotnikov, Georgina Quint, MD  polyethylene glycol (MIRALAX / GLYCOLAX) 17 g packet Take 17 g by mouth 2 (two) times daily. 06/15/23   Azucena Fallen, MD  potassium chloride (KLOR-CON) 8 MEQ tablet TAKE ONE (1) TABLET BY MOUTH EVERY DAY 11/30/22   Plotnikov, Georgina Quint, MD  protein supplement (PROSOURCE NO CARB) LIQD Take 30 mLs by mouth 2 (two) times daily.    [provider]  rifaximin (XIFAXAN) 550 MG TABS tablet Take 1 tablet (550 mg total) by mouth 2 (two) times daily. 06/16/22   Plotnikov, Georgina Quint, MD  sennosides-docusate sodium (SENOKOT-S) 8.6-50 MG tablet Take 2 tablets by mouth every 12 (twelve) hours.    [provider]  sorbitol-magnesium hydroxide-mineral oil-glycerin Place 960 mLs rectally 2 (two) times daily as needed. 06/15/23   Natale Milch,  Kristine Garbe, MD  torsemide (DEMADEX) 100 MG tablet Take 1 tablet (100 mg total) by mouth 2 (two) times daily. Patient taking differently: Take 40 mg by mouth daily. 11/25/21   Plotnikov, Georgina Quint, MD  triamcinolone cream (KENALOG) 0.1 % Apply topically 2 (two) times daily. Patient taking differently: Apply 1 Application topically 2 (two) times daily. To dry patches on BLE and bilateral feet 02/01/23   Plotnikov, Georgina Quint,  MD      Allergies    Hibiclens [chlorhexidine gluconate] and Penicillins    Review of Systems   Review of Systems  Musculoskeletal:  Positive for arthralgias.  All other systems reviewed and are negative.   Physical Exam Updated Vital Signs BP (!) 105/47   Pulse 63   Temp 98.4 F (36.9 C) (Rectal)   Resp 20   Ht 5\' 1"  (1.549 m)   Wt 92 kg   LMP  (LMP Unknown)   SpO2 100%   BMI 38.32 kg/m  Physical Exam Vitals and nursing note reviewed.  Constitutional:      General: She is not in acute distress.    Appearance: She is well-developed.  HENT:     Head: Normocephalic and atraumatic.  Eyes:     Conjunctiva/sclera: Conjunctivae normal.  Cardiovascular:     Rate and Rhythm: Normal rate and regular rhythm.     Heart sounds: No murmur heard. Pulmonary:     Effort: Pulmonary effort is normal. No respiratory distress.     Breath sounds: Normal breath sounds.  Abdominal:     Palpations: Abdomen is soft.     Tenderness: There is no abdominal tenderness.  Musculoskeletal:        General: Tenderness and signs of injury present. No swelling or deformity.     Cervical back: Neck supple.     Comments: TTP in left shoulder, bilateral knees, and bilateral feet. Chronic appear arthritic changes and deformities, no acute bony deformity noted.  Skin:    General: Skin is warm and dry.     Capillary Refill: Capillary refill takes less than 2 seconds.  Neurological:     Mental Status: She is alert.     Cranial Nerves: No cranial nerve deficit.     Motor: Weakness present.     Comments: Strength: Right UE: 2 out of 5 Left UE: 4 out of 5  Right LE: 3 out of 5 Left LE: 4 out of 5  No facial droop, slurred speech, or forehead sparing.  Psychiatric:        Mood and Affect: Mood normal.     ED Results / Procedures / Treatments   Labs (all labs ordered are listed, but only abnormal results are displayed) Labs Reviewed  COMPREHENSIVE METABOLIC PANEL - Abnormal; Notable for the  following components:      Result Value   Glucose, Bld 117 (*)    BUN 44 (*)    Creatinine, Ser 2.04 (*)    Albumin 3.2 (*)    Alkaline Phosphatase 129 (*)    GFR, Estimated 25 (*)    All other components within normal limits  CBC WITH DIFFERENTIAL/PLATELET - Abnormal; Notable for the following components:   RBC 3.06 (*)    Hemoglobin 9.3 (*)    HCT 30.2 (*)    Platelets 143 (*)    All other components within normal limits  PROTIME-INR - Abnormal; Notable for the following components:   Prothrombin Time 17.6 (*)    INR 1.4 (*)  All other components within normal limits  URINALYSIS, W/ REFLEX TO CULTURE (INFECTION SUSPECTED) - Abnormal; Notable for the following components:   APPearance TURBID (*)    Hgb urine dipstick SMALL (*)    Protein, ur 30 (*)    Leukocytes,Ua LARGE (*)    Bacteria, UA MANY (*)    Non Squamous Epithelial 0-5 (*)    All other components within normal limits  CULTURE, BLOOD (ROUTINE X 2)  CULTURE, BLOOD (ROUTINE X 2)  URINE CULTURE  APTT  URINALYSIS, COMPLETE (UACMP) WITH MICROSCOPIC  I-STAT CG4 LACTIC ACID, ED  I-STAT CG4 LACTIC ACID, ED    EKG None  Radiology CT Head Wo Contrast  Result Date: 08/20/2023 CLINICAL DATA:  Trauma EXAM: CT HEAD WITHOUT CONTRAST CT CERVICAL SPINE WITHOUT CONTRAST TECHNIQUE: Multidetector CT imaging of the head and cervical spine was performed following the standard protocol without intravenous contrast. Multiplanar CT image reconstructions of the cervical spine were also generated. RADIATION DOSE REDUCTION: This exam was performed according to the departmental dose-optimization program which includes automated exposure control, adjustment of the mA and/or kV according to patient size and/or use of iterative reconstruction technique. COMPARISON:  None Available. FINDINGS: CT HEAD FINDINGS Brain: There is no mass, hemorrhage or extra-axial collection. The size and configuration of the ventricles and extra-axial CSF spaces  are normal. There is hypoattenuation of the periventricular white matter, most commonly indicating chronic ischemic microangiopathy. Vascular: No abnormal hyperdensity of the major intracranial arteries or dural venous sinuses. No intracranial atherosclerosis. Skull: The visualized skull base, calvarium and extracranial soft tissues are normal. Sinuses/Orbits: No fluid levels or advanced mucosal thickening of the visualized paranasal sinuses. No mastoid or middle ear effusion. The orbits are normal. CT CERVICAL SPINE FINDINGS Alignment: Anterolisthesis at C2-3.  Grade 1 retrolisthesis at C4-5. Skull base and vertebrae: No acute fracture. Soft tissues and spinal canal: No prevertebral fluid or swelling. No visible canal hematoma. Disc levels: Multilevel degenerative disc disease. No high-grade spinal canal stenosis. Multilevel neural foraminal narrowing, greatest at C4-5. Upper chest: No pneumothorax, pulmonary nodule or pleural effusion. Other: Calcific atherosclerosis of the left greater than right carotid system. IMPRESSION: 1. No acute intracranial abnormality. 2. No acute fracture or static subluxation of the cervical spine. 3. Multilevel degenerative disc disease. Electronically Signed   By: Deatra Robinson M.D.   On: 08/20/2023 22:01   CT Cervical Spine Wo Contrast  Result Date: 08/20/2023 CLINICAL DATA:  Trauma EXAM: CT HEAD WITHOUT CONTRAST CT CERVICAL SPINE WITHOUT CONTRAST TECHNIQUE: Multidetector CT imaging of the head and cervical spine was performed following the standard protocol without intravenous contrast. Multiplanar CT image reconstructions of the cervical spine were also generated. RADIATION DOSE REDUCTION: This exam was performed according to the departmental dose-optimization program which includes automated exposure control, adjustment of the mA and/or kV according to patient size and/or use of iterative reconstruction technique. COMPARISON:  None Available. FINDINGS: CT HEAD FINDINGS  Brain: There is no mass, hemorrhage or extra-axial collection. The size and configuration of the ventricles and extra-axial CSF spaces are normal. There is hypoattenuation of the periventricular white matter, most commonly indicating chronic ischemic microangiopathy. Vascular: No abnormal hyperdensity of the major intracranial arteries or dural venous sinuses. No intracranial atherosclerosis. Skull: The visualized skull base, calvarium and extracranial soft tissues are normal. Sinuses/Orbits: No fluid levels or advanced mucosal thickening of the visualized paranasal sinuses. No mastoid or middle ear effusion. The orbits are normal. CT CERVICAL SPINE FINDINGS Alignment: Anterolisthesis at C2-3.  Grade 1  retrolisthesis at C4-5. Skull base and vertebrae: No acute fracture. Soft tissues and spinal canal: No prevertebral fluid or swelling. No visible canal hematoma. Disc levels: Multilevel degenerative disc disease. No high-grade spinal canal stenosis. Multilevel neural foraminal narrowing, greatest at C4-5. Upper chest: No pneumothorax, pulmonary nodule or pleural effusion. Other: Calcific atherosclerosis of the left greater than right carotid system. IMPRESSION: 1. No acute intracranial abnormality. 2. No acute fracture or static subluxation of the cervical spine. 3. Multilevel degenerative disc disease. Electronically Signed   By: Deatra Robinson M.D.   On: 08/20/2023 22:01   DG Pelvis Portable  Result Date: 08/20/2023 CLINICAL DATA:  Fall EXAM: PORTABLE PELVIS 1-2 VIEWS COMPARISON:  None Available. FINDINGS: There is no evidence of displaced pelvic fracture or diastasis. No pelvic bone lesions are seen. IMPRESSION: No displaced pelvic fracture or dislocation. Electronically Signed   By: Jearld Lesch M.D.   On: 08/20/2023 21:43   DG Shoulder Left Portable  Result Date: 08/20/2023 CLINICAL DATA:  Questionable sepsis EXAM: LEFT SHOULDER COMPARISON:  05/23/2023 FINDINGS: No fracture or dislocation of the left  shoulder. Mild acromioclavicular and glenohumeral arthrosis with high riding position of the humerus, suggesting chronic rotator cuff tear. IMPRESSION: 1. No fracture or dislocation of the left shoulder. 2. Mild acromioclavicular and glenohumeral arthrosis with high riding position of the humerus, suggesting chronic rotator cuff tear. Electronically Signed   By: Jearld Lesch M.D.   On: 08/20/2023 21:36   DG Knee Complete 4 Views Right  Result Date: 08/20/2023 CLINICAL DATA:  Questionable sepsis EXAM: RIGHT KNEE - COMPLETE 4+ VIEW; LEFT KNEE - COMPLETE 4+ VIEW COMPARISON:  None Available. FINDINGS: No fracture or dislocation of the bilateral knees. Severe, bone-on-bone tricompartmental arthrosis, worst in the patellofemoral compartments. No knee joint effusions. Soft tissues unremarkable. IMPRESSION: 1. No fracture or dislocation of the bilateral knees. 2. Severe, bone-on-bone tricompartmental arthrosis, worst in the patellofemoral compartments. 3. No knee joint effusions. Electronically Signed   By: Jearld Lesch M.D.   On: 08/20/2023 21:34   DG Knee Complete 4 Views Left  Result Date: 08/20/2023 CLINICAL DATA:  Questionable sepsis EXAM: RIGHT KNEE - COMPLETE 4+ VIEW; LEFT KNEE - COMPLETE 4+ VIEW COMPARISON:  None Available. FINDINGS: No fracture or dislocation of the bilateral knees. Severe, bone-on-bone tricompartmental arthrosis, worst in the patellofemoral compartments. No knee joint effusions. Soft tissues unremarkable. IMPRESSION: 1. No fracture or dislocation of the bilateral knees. 2. Severe, bone-on-bone tricompartmental arthrosis, worst in the patellofemoral compartments. 3. No knee joint effusions. Electronically Signed   By: Jearld Lesch M.D.   On: 08/20/2023 21:34   DG Chest Port 1 View  Result Date: 08/20/2023 CLINICAL DATA:  Questionable sepsis EXAM: PORTABLE CHEST 1 VIEW COMPARISON:  05/17/2023 FINDINGS: Gross cardiomegaly. Left chest multi lead pacer. Mild diffuse interstitial  opacity. No acute osseous findings. Unchanged, chronic, resorbed fractures of the proximal right humerus. IMPRESSION: Gross cardiomegaly with mild diffuse interstitial opacity, consistent with edema or atypical/viral infection. No focal airspace opacity. Electronically Signed   By: Jearld Lesch M.D.   On: 08/20/2023 21:30    Procedures .Critical Care  Performed by: Smitty Knudsen, PA-C Authorized by: Smitty Knudsen, PA-C   Critical care provider statement:    Critical care time (minutes):  59   Critical care start time:  08/20/2023 10:40 PM   Critical care end time:  08/20/2023 11:39 PM   Critical care time was exclusive of:  Separately billable procedures and treating other patients   Critical care  was necessary to treat or prevent imminent or life-threatening deterioration of the following conditions:  Trauma   Critical care was time spent personally by me on the following activities:  Development of treatment plan with patient or surrogate, discussions with consultants, evaluation of patient's response to treatment, examination of patient, ordering and review of radiographic studies, ordering and review of laboratory studies and ordering and performing treatments and interventions   I assumed direction of critical care for this patient from another provider in my specialty: no     Care discussed with: admitting provider       Medications Ordered in ED Medications  lactated ringers bolus 1,000 mL (1,000 mLs Intravenous New Bag/Given 08/20/23 2147)  fentaNYL (SUBLIMAZE) injection 50 mcg (50 mcg Intravenous Given 08/20/23 2146)    ED Course/ Medical Decision Making/ A&P                               Medical Decision Making Amount and/or Complexity of Data Reviewed Labs: ordered. Radiology: ordered. ECG/medicine tests: ordered.  Risk Prescription drug management.   This patient presents to the ED for concern of fall.  Differential diagnosis includes elbow dislocation, SAH,  pneumonia, sepsis, UTI   Lab Tests:  I Ordered, and personally interpreted labs.  The pertinent results include: CBC with baseline anemia, CMP with evidence of AKI with creatinine at 2.04 which is almost doubled compared to 1 month prior, urinalysis with clear signs of infection with hemoglobin, leukocytes and bacteria present, lactic acid negative at 0.9 not septic, blood cultures pending, urine culture pending   Imaging Studies ordered:  I ordered imaging studies including CT head, CT cervical spine, x-ray of pelvis, left shoulder, left and right knees, and chest I independently visualized and interpreted imaging which showed no acute findings noted on any imaging, chronic arthritis noted in bilateral knees which is fairly severe I agree with the radiologist interpretation   Medicines ordered and prescription drug management:  I ordered medication including fluids, fentanyl for dehydration, pain  Reevaluation of the patient after these medicines showed that the patient improved I have reviewed the patients home medicines and have made adjustments as needed   Problem List / ED Course:  Patient presents emergency department as a level 2 trauma following mechanical fall.  Patient on blood thinners.  Patient reportedly had a mechanical fall earlier this evening as well as two days ago. Per EMS and family report, patient LKW was 08/18/2023. At that time, patient noted to have foot drop on right side which caused instability and initial fall, but no head strike at that time. Patient reportedly having progression from right foot drop to right sided arm weakness. No apparent facial droop when I initially evaluated patient. Based on trauma presentation and head strike on thinners, CT head, cervical and xray imaging of areas of tenderness ordered. EMS also expressed concern for possible sepsis as patient is warm to touch but afebrile and "smells of UTI". Patient currently denying any urinary  symptoms. Labs are largely unremarkable at this time with no leukocytosis or lactic acidosis. CMP with mild elevation in creatinine and GFR declining. Other labs pending. Blood cultures collected. CT head and cervical spine negative for any acute findings. No acute stoke. Given right sided neuro deficits, will consult neurology. Spoke with Dr. Amada Jupiter, neurology, who advised given clinical symptoms, evaluation with MRI brain and cervical spine indicated and concern is for possible stroke. Patient's UTI with  signs of infection. Will send off for culture and initiate antibiotic therapy with Rocephin. Suspect that this may be contributing to weakness patient is endorsing but will proceed with MRI imaging to rule out possible acute stroke. Spoke with Dr. Lazarus Salines, hospitalist, who will be admitting patient. Informed about pending MR imaging and need to notify neurology of results once returned.  Final Clinical Impression(s) / ED Diagnoses Final diagnoses:  Fall, initial encounter  AKI (acute kidney injury) (HCC)  Acute cystitis without hematuria  Myalgia    Rx / DC Orders ED Discharge Orders     None         Smitty Knudsen, PA-C 08/20/23 2341    Glyn Ade, MD 08/21/23 1503

## 2023-08-21 ENCOUNTER — Telehealth: Payer: Self-pay | Admitting: Internal Medicine

## 2023-08-21 ENCOUNTER — Inpatient Hospital Stay (HOSPITAL_COMMUNITY): Payer: Medicare HMO

## 2023-08-21 ENCOUNTER — Encounter (HOSPITAL_COMMUNITY): Payer: Self-pay | Admitting: Internal Medicine

## 2023-08-21 DIAGNOSIS — R2990 Unspecified symptoms and signs involving the nervous system: Secondary | ICD-10-CM | POA: Diagnosis not present

## 2023-08-21 LAB — CBC
HCT: 25.5 % — ABNORMAL LOW (ref 36.0–46.0)
Hemoglobin: 7.7 g/dL — ABNORMAL LOW (ref 12.0–15.0)
MCH: 30.3 pg (ref 26.0–34.0)
MCHC: 30.2 g/dL (ref 30.0–36.0)
MCV: 100.4 fL — ABNORMAL HIGH (ref 80.0–100.0)
Platelets: 140 10*3/uL — ABNORMAL LOW (ref 150–400)
RBC: 2.54 MIL/uL — ABNORMAL LOW (ref 3.87–5.11)
RDW: 14.4 % (ref 11.5–15.5)
WBC: 6.9 10*3/uL (ref 4.0–10.5)
nRBC: 0 % (ref 0.0–0.2)

## 2023-08-21 LAB — IRON AND TIBC
Iron: 45 ug/dL (ref 28–170)
Saturation Ratios: 12 % (ref 10.4–31.8)
TIBC: 370 ug/dL (ref 250–450)
UIBC: 325 ug/dL

## 2023-08-21 LAB — BASIC METABOLIC PANEL
Anion gap: 13 (ref 5–15)
Anion gap: 10 (ref 5–15)
BUN: 41 mg/dL — ABNORMAL HIGH (ref 8–23)
BUN: 41 mg/dL — ABNORMAL HIGH (ref 8–23)
CO2: 26 mmol/L (ref 22–32)
CO2: 27 mmol/L (ref 22–32)
Calcium: 9.2 mg/dL (ref 8.9–10.3)
Calcium: 9.2 mg/dL (ref 8.9–10.3)
Chloride: 104 mmol/L (ref 98–111)
Chloride: 104 mmol/L (ref 98–111)
Creatinine, Ser: 2 mg/dL — ABNORMAL HIGH (ref 0.44–1.00)
Creatinine, Ser: 1.89 mg/dL — ABNORMAL HIGH (ref 0.44–1.00)
GFR, Estimated: 25 mL/min — ABNORMAL LOW (ref >60)
GFR, Estimated: 27 mL/min — ABNORMAL LOW (ref >60)
Glucose, Bld: 106 mg/dL — ABNORMAL HIGH (ref 70–99)
Glucose, Bld: 98 mg/dL (ref 70–99)
Potassium: 3.4 mmol/L — ABNORMAL LOW (ref 3.5–5.1)
Potassium: 3.5 mmol/L (ref 3.5–5.1)
Sodium: 143 mmol/L (ref 135–145)
Sodium: 141 mmol/L (ref 135–145)

## 2023-08-21 LAB — MAGNESIUM: Magnesium: 2.7 mg/dL — ABNORMAL HIGH (ref 1.7–2.4)

## 2023-08-21 LAB — LIPID PANEL
Cholesterol: 151 mg/dL (ref 0–200)
HDL: 63 mg/dL (ref >40)
LDL Cholesterol: 75 mg/dL (ref 0–99)
Total CHOL/HDL Ratio: 2.4 {ratio}
Triglycerides: 67 mg/dL (ref <150)
VLDL: 13 mg/dL (ref 0–40)

## 2023-08-21 LAB — TYPE AND SCREEN
ABO/RH(D): O POS
Antibody Screen: NEGATIVE

## 2023-08-21 LAB — HEMOGLOBIN AND HEMATOCRIT, BLOOD
HCT: 26.3 % — ABNORMAL LOW (ref 36.0–46.0)
Hemoglobin: 7.9 g/dL — ABNORMAL LOW (ref 12.0–15.0)

## 2023-08-21 LAB — HEMOGLOBIN A1C
Hgb A1c MFr Bld: 4.8 % (ref 4.8–5.6)
Mean Plasma Glucose: 91.06 mg/dL

## 2023-08-21 LAB — PHOSPHORUS: Phosphorus: 3.2 mg/dL (ref 2.5–4.6)

## 2023-08-21 LAB — VITAMIN B12: Vitamin B-12: 327 pg/mL (ref 180–914)

## 2023-08-21 LAB — FOLATE: Folate: 19.2 ng/mL (ref >5.9)

## 2023-08-21 LAB — FERRITIN: Ferritin: 28 ng/mL (ref 11–307)

## 2023-08-21 LAB — I-STAT CG4 LACTIC ACID, ED: Lactic Acid, Venous: 0.9 mmol/L (ref 0.5–1.9)

## 2023-08-21 MED ORDER — POLYETHYLENE GLYCOL 3350 17 G PO PACK
17.0000 g | PACK | Freq: Two times a day (BID) | ORAL | Status: DC | PRN
  Administered 2023-08-22 – 2023-08-25 (×4): 17 g via ORAL
  Filled 2023-08-21 (×4): qty 1

## 2023-08-21 MED ORDER — SODIUM CHLORIDE 0.9 % IV BOLUS
1000.0000 mL | Freq: Once | INTRAVENOUS | Status: AC
  Administered 2023-08-21: 1000 mL via INTRAVENOUS

## 2023-08-21 MED ORDER — PANTOPRAZOLE SODIUM 40 MG PO TBEC
40.0000 mg | DELAYED_RELEASE_TABLET | Freq: Every day | ORAL | Status: DC
  Administered 2023-08-21 – 2023-08-28 (×8): 40 mg via ORAL
  Filled 2023-08-21 (×8): qty 1

## 2023-08-21 MED ORDER — MIDODRINE HCL 5 MG PO TABS
5.0000 mg | ORAL_TABLET | Freq: Three times a day (TID) | ORAL | Status: DC
  Administered 2023-08-21 (×4): 5 mg via ORAL
  Filled 2023-08-21 (×4): qty 1

## 2023-08-21 MED ORDER — ALLOPURINOL 100 MG PO TABS
50.0000 mg | ORAL_TABLET | Freq: Every day | ORAL | Status: DC
  Administered 2023-08-21 – 2023-08-28 (×8): 50 mg via ORAL
  Filled 2023-08-21 (×8): qty 1

## 2023-08-21 MED ORDER — LEVOTHYROXINE SODIUM 50 MCG PO TABS
50.0000 ug | ORAL_TABLET | Freq: Every day | ORAL | Status: DC
  Administered 2023-08-21 – 2023-08-28 (×7): 50 ug via ORAL
  Filled 2023-08-21 (×7): qty 1
  Filled 2023-08-21: qty 2

## 2023-08-21 MED ORDER — LACTULOSE 10 GM/15ML PO SOLN
10.0000 g | Freq: Three times a day (TID) | ORAL | Status: DC
  Administered 2023-08-21 – 2023-08-28 (×14): 10 g via ORAL
  Filled 2023-08-21 (×10): qty 30
  Filled 2023-08-21: qty 15
  Filled 2023-08-21 (×8): qty 30

## 2023-08-21 MED ORDER — ACETAMINOPHEN 500 MG PO TABS
1000.0000 mg | ORAL_TABLET | Freq: Four times a day (QID) | ORAL | Status: DC | PRN
  Administered 2023-08-25 (×3): 1000 mg via ORAL
  Filled 2023-08-21 (×3): qty 2

## 2023-08-21 MED ORDER — APIXABAN 5 MG PO TABS
5.0000 mg | ORAL_TABLET | Freq: Two times a day (BID) | ORAL | Status: DC
  Administered 2023-08-21 – 2023-08-24 (×8): 5 mg via ORAL
  Filled 2023-08-21 (×8): qty 1

## 2023-08-21 MED ORDER — PRAVASTATIN SODIUM 10 MG PO TABS
20.0000 mg | ORAL_TABLET | Freq: Every day | ORAL | Status: DC
  Administered 2023-08-21 – 2023-08-27 (×7): 20 mg via ORAL
  Filled 2023-08-21 (×7): qty 2

## 2023-08-21 MED ORDER — APIXABAN 5 MG PO TABS: 5.0000 mg | ORAL_TABLET | Freq: Two times a day (BID) | ORAL | Status: DC

## 2023-08-21 MED ORDER — OXYCODONE HCL 5 MG PO TABS
2.5000 mg | ORAL_TABLET | Freq: Four times a day (QID) | ORAL | Status: DC | PRN
  Administered 2023-08-21 – 2023-08-27 (×11): 2.5 mg via ORAL
  Filled 2023-08-21 (×11): qty 1

## 2023-08-21 MED ORDER — RIFAXIMIN 550 MG PO TABS
550.0000 mg | ORAL_TABLET | Freq: Two times a day (BID) | ORAL | Status: DC
  Administered 2023-08-21 – 2023-08-28 (×15): 550 mg via ORAL
  Filled 2023-08-21 (×15): qty 1

## 2023-08-21 NOTE — ED Notes (Signed)
ED TO INPATIENT HANDOFF REPORT  ED Nurse Name and Phone #:  Theophilus Bones [phone]  S Name/Age/Gender Katelyn Jackson 77 y.o. female Room/Bed: 009C/009C  Code Status   Code Status: Limited: Do not attempt resuscitation (DNR) -DNR-LIMITED -Do Not Intubate/DNI   Home/SNF/Other Home Patient oriented to: self, place, time, and situation Is this baseline? Yes   Triage Complete: Triage complete  Chief Complaint Stroke-like symptoms [R29.90]  Triage Note Chief Complaint  Patient presents with   Fall   Pt presents to ED 29 via EMS from home with above.  Per report, pt started having trouble with right leg movement Friday.  Pt then fell out of recliner Saturday.  Today, pt fell while using restroom and hit her head.   Allergies Allergies  Allergen Reactions   Hibiclens [Chlorhexidine Gluconate] Hives   Penicillins Itching    Tolerated Cephalosporin Date: 03/17/21. Td Ancef 2g 07/26/21    Level of Care/Admitting Diagnosis ED Disposition     ED Disposition  Admit   Condition  --   Comment  Hospital Area: [facility] [100100]  Level of Care: Telemetry Medical [104]  May admit patient to [facility] or [facility] if equivalent level of care is available:: No  Covid Evaluation: Asymptomatic - no recent exposure (last 10 days) testing not required  Diagnosis: Stroke-like symptoms [725552]  Admitting Physician: Dolly Rias [[id number]]  Attending Physician: Dolly Rias [[id number]]  Certification:: I certify this patient will need inpatient services for at least 2 midnights  Expected Medical Readiness: 08/22/2023          B Medical/Surgery History Past Medical History:  Diagnosis Date   Arthritis    Breast lesion 12/07/2021   Cancer [facility])    breast cancer   Chronic atrial fibrillation (HCC)    Chronic kidney disease    sees Dr Lowell Guitar   Colonization with VRE (vancomycin-resistant enterococcus) 09/22/2021   Cramps, muscle, general    Dry skin  09/22/2021   Dyspnea on exertion    Dysrhythmia    a-fib,    ESBL E. coli carrier 09/22/2021   GERD (gastroesophageal reflux disease)    Headache    Herpes labialis 08/19/2021   Hyperlipidemia    Hypertension    Hypothyroidism    Lymphedema    Moderate to severe pulmonary hypertension (HCC)    Obesity    Osteoarthritis of right knee 08/19/2021   Personal history of radiation therapy    Pneumonia 12/2020   Presence of permanent cardiac pacemaker 01/21/2021   for bradycardia    Syncope 12/2020   needed a pacemaker   Past Surgical History:  Procedure Laterality Date   APPENDECTOMY     BREAST BIOPSY Left 2018   BREAST LUMPECTOMY Left 04/04/2017   x3   BREAST LUMPECTOMY WITH NEEDLE LOCALIZATION AND AXILLARY SENTINEL LYMPH NODE BX Left 04/04/2017   Procedure: BREAST LUMPECTOMY WITH NEEDLE LOCALIZATION x3 AND AXILLARY SENTINEL LYMPH NODE BX;  Surgeon: Almond Lint, MD;  Location: [facility] OR;  Service: General;  Laterality: Left;   CATARACT EXTRACTION W/ INTRAOCULAR LENS  IMPLANT, BILATERAL Bilateral 2018   COLONOSCOPY     EYE SURGERY     GLAUCOMA SURGERY Bilateral 2018   HARDWARE REMOVAL Right 07/26/2021   Procedure: RIGHT SHOULDER HARDWARE REMOVAL WITH WASHOUT;  Surgeon: Bjorn Pippin, MD;  Location: [facility] ORS;  Service: Orthopedics;  Laterality: Right;   INSERT / REPLACE / REMOVE PACEMAKER  01/21/2021   IR FLUORO GUIDE CV LINE LEFT  07/29/2021  IR REMOVAL TUN CV CATH W/O FL  09/07/2021   IR US GUIDE BX ASP/DRAIN  07/16/2021   RE-EXCISION OF BREAST CANCER,SUPERIOR MARGINS Left 04/26/2017   Procedure: RE-EXCISION OF LEFT BREAST CANCER;  Surgeon: Almond Lint, MD;  Location: [facility] OR;  Service: General;  Laterality: Left;   SHOULDER HEMI-ARTHROPLASTY Right 03/17/2021   Procedure: SHOULDER HEMI-ARTHROPLASTY;  Surgeon: Bjorn Pippin, MD;  Location: [facility];  Service: Orthopedics;  Laterality: Right;     A IV Location/Drains/Wounds Patient Lines/Drains/Airways Status     Active  Line/Drains/Airways     Name Placement date Placement time Site Days   Peripheral IV 08/20/23 20 G Left Hand 08/20/23  --  Hand  1   Pressure Injury 07/03/21 Thigh Posterior;Left;Upper Stage 3 -  Full thickness tissue loss. Subcutaneous fat may be visible but bone, tendon or muscle are NOT exposed. 07/03/21  0453  -- 779   Pressure Injury 07/03/21 Sacrum Stage 2 -  Partial thickness loss of dermis presenting as a shallow open injury with a red, pink wound bed without slough. sacrum/inner gluteal fold 07/03/21  2105  -- 779   Pressure Injury 06/13/23 Buttocks 06/13/23  0500  -- 69   Wound / Incision (Open or Dehisced) 07/11/21 Shoulder Right Redness 07/11/21  1135  Shoulder  771   Wound / Incision (Open or Dehisced) 05/18/23 Thigh Posterior;Right;Upper Stage 3 05/18/23  1000  Thigh  95   Wound / Incision (Open or Dehisced) 05/18/23 Thigh Right;Lower;Posterior full thickness wound 05/18/23  1000  Thigh  95   Wound / Incision (Open or Dehisced) 05/19/23 Other (Comment) Thigh Left;Posterior;Lower full thickness wound 05/19/23  --  Thigh  94            Intake/Output Last 24 hours  Intake/Output Summary (Last 24 hours) at 08/21/2023 0758 Last data filed at 08/21/2023 0755 Gross per 24 hour  Intake 2000 ml  Output --  Net 2000 ml    Labs/Imaging Results for orders placed or performed during the hospital encounter of 08/20/23 (from the past 48 hour(s))  Comprehensive metabolic panel     Status: Abnormal   Collection Time: 08/20/23  8:45 PM  Result Value Ref Range   Sodium 141 135 - 145 mmol/L   Potassium 3.5 3.5 - 5.1 mmol/L   Chloride 101 98 - 111 mmol/L   CO2 27 22 - 32 mmol/L   Glucose, Bld 117 (H) 70 - 99 mg/dL    Comment: Glucose reference range applies only to samples taken after fasting for at least 8 hours.   BUN 44 (H) 8 - 23 mg/dL   Creatinine, Ser 1.61 (H) 0.44 - 1.00 mg/dL   Calcium 9.0 8.9 - 09.6 mg/dL   Total Protein 6.6 6.5 - 8.1 g/dL   Albumin 3.2 (L) 3.5 - 5.0 g/dL    AST 17 15 - 41 U/L   ALT 12 0 - 44 U/L   Alkaline Phosphatase 129 (H) 38 - 126 U/L   Total Bilirubin 0.9 0.3 - 1.2 mg/dL   GFR, Estimated 25 (L) >60 mL/min    Comment: (NOTE) Calculated using the CKD-EPI Creatinine Equation (2021)    Anion gap 13 5 - 15    Comment: Performed at [facility], [address], [place], [place] [address]  CBC with Differential     Status: Abnormal   Collection Time: 08/20/23  8:45 PM  Result Value Ref Range   WBC 7.7 4.0 - 10.5 K/uL   RBC 3.06 (  L) 3.87 - 5.11 MIL/uL   Hemoglobin 9.3 (L) 12.0 - 15.0 g/dL   HCT 47.8 (L) 29.5 - 62.1 %   MCV 98.7 80.0 - 100.0 fL   MCH 30.4 26.0 - 34.0 pg   MCHC 30.8 30.0 - 36.0 g/dL   RDW 30.8 65.7 - 84.6 %   Platelets 143 (L) 150 - 400 K/uL   nRBC 0.0 0.0 - 0.2 %   Neutrophils Relative % 74 %   Neutro Abs 5.7 1.7 - 7.7 K/uL   Lymphocytes Relative 15 %   Lymphs Abs 1.1 0.7 - 4.0 K/uL   Monocytes Relative 10 %   Monocytes Absolute 0.8 0.1 - 1.0 K/uL   Eosinophils Relative 1 %   Eosinophils Absolute 0.1 0.0 - 0.5 K/uL   Basophils Relative 0 %   Basophils Absolute 0.0 0.0 - 0.1 K/uL   Immature Granulocytes 0 %   Abs Immature Granulocytes 0.03 0.00 - 0.07 K/uL    Comment: Performed at [facility], [address] [place], [place] [address]  Protime-INR     Status: Abnormal   Collection Time: 08/20/23  8:45 PM  Result Value Ref Range   Prothrombin Time 17.6 (H) 11.4 - 15.2 seconds   INR 1.4 (H) 0.8 - 1.2    Comment: (NOTE) INR goal varies based on device and disease states. Performed at [facility], [address] [place], [place] [address]   APTT     Status: None   Collection Time: 08/20/23  8:45 PM  Result Value Ref Range   aPTT 30 24 - 36 seconds    Comment: Performed at [facility], [address], [place], [place] [address]  I-Stat Lactic Acid, ED     Status: None   Collection Time: 08/20/23  8:55 PM  Result Value Ref Range   Lactic Acid, Venous 0.9 0.5 - 1.9 mmol/L   Urinalysis, w/ Reflex to Culture (Infection Suspected) -Urine, Clean Catch     Status: Abnormal   Collection Time: 08/20/23 10:46 PM  Result Value Ref Range   Specimen Source URINE, CLEAN CATCH    Color, Urine YELLOW YELLOW   APPearance TURBID (A) CLEAR   Specific Gravity, Urine 1.010 1.005 - 1.030   pH 6.0 5.0 - 8.0   Glucose, UA NEGATIVE NEGATIVE mg/dL   Hgb urine dipstick SMALL (A) NEGATIVE   Bilirubin Urine NEGATIVE NEGATIVE   Ketones, ur NEGATIVE NEGATIVE mg/dL   Protein, ur 30 (A) NEGATIVE mg/dL   Nitrite NEGATIVE NEGATIVE   Leukocytes,Ua LARGE (A) NEGATIVE   RBC / HPF 21-50 0 - 5 RBC/hpf   WBC, UA >50 0 - 5 WBC/hpf    Comment:        Reflex urine culture not performed if WBC <=10, OR if Squamous epithelial cells >5. If Squamous epithelial cells >5 suggest recollection.    Bacteria, UA MANY (A) NONE SEEN   Squamous Epithelial / HPF 21-50 0 - 5 /HPF   WBC Clumps PRESENT    Non Squamous Epithelial 0-5 (A) NONE SEEN    Comment: Performed at [facility], [address] [place], [place] [address]  Basic metabolic panel     Status: Abnormal   Collection Time: 08/21/23 12:28 AM  Result Value Ref Range   Sodium 143 135 - 145 mmol/L   Potassium 3.4 (L) 3.5 - 5.1 mmol/L   Chloride 104 98 - 111 mmol/L   CO2 26 22 - 32 mmol/L  Glucose, Bld 106 (H) 70 - 99 mg/dL    Comment: Glucose reference range applies only to samples taken after fasting for at least 8 hours.   BUN 41 (H) 8 - 23 mg/dL   Creatinine, Ser 1.61 (H) 0.44 - 1.00 mg/dL   Calcium 9.2 8.9 - 09.6 mg/dL   GFR, Estimated 25 (L) >60 mL/min    Comment: (NOTE) Calculated using the CKD-EPI Creatinine Equation (2021)    Anion gap 13 5 - 15    Comment: Performed at [facility], [address], [place], [place] [address]  CBC     Status: Abnormal   Collection Time: 08/21/23 12:28 AM  Result Value Ref Range   WBC 6.9 4.0 - 10.5 K/uL   RBC 2.54 (L) 3.87 - 5.11 MIL/uL   Hemoglobin 7.7 (L) 12.0 - 15.0 g/dL    HCT 98.1 (L) 19.1 - 46.0 %   MCV 100.4 (H) 80.0 - 100.0 fL   MCH 30.3 26.0 - 34.0 pg   MCHC 30.2 30.0 - 36.0 g/dL   RDW 47.8 29.5 - 62.1 %   Platelets 140 (L) 150 - 400 K/uL   nRBC 0.0 0.0 - 0.2 %    Comment: Performed at [facility], [address], [place], [place] [address]  Magnesium     Status: Abnormal   Collection Time: 08/21/23 12:28 AM  Result Value Ref Range   Magnesium 2.7 (H) 1.7 - 2.4 mg/dL    Comment: Performed at [facility], [address], [place], [place] [address]  Phosphorus     Status: None   Collection Time: 08/21/23 12:28 AM  Result Value Ref Range   Phosphorus 3.2 2.5 - 4.6 mg/dL    Comment: Performed at [facility], [address], [place], [place] [address]  I-Stat Lactic Acid, ED     Status: None   Collection Time: 08/21/23 12:34 AM  Result Value Ref Range   Lactic Acid, Venous 0.9 0.5 - 1.9 mmol/L  Type and screen [facility]     Status: None   Collection Time: 08/21/23  2:37 AM  Result Value Ref Range   ABO/RH(D) O POS    Antibody Screen NEG    Sample Expiration      08/24/2023,2359 Performed at [facility], [address], [place], [place] [address]   Hemoglobin and hematocrit, blood     Status: Abnormal   Collection Time: 08/21/23  2:37 AM  Result Value Ref Range   Hemoglobin 7.9 (L) 12.0 - 15.0 g/dL   HCT 44.0 (L) 10.2 - 72.5 %    Comment: Performed at [facility], [address] [place] [address], [place], [place] [address]   MR Cervical Spine Wo Contrast  Result Date: 08/21/2023 CLINICAL DATA:  Trauma EXAM: MRI CERVICAL SPINE WITHOUT CONTRAST TECHNIQUE: Multiplanar, multisequence MR imaging of the cervical spine was performed. No intravenous contrast was administered. COMPARISON:  None Available. FINDINGS: Alignment: Grade 1 anterolisthesis at C2-3 and grade 1 retrolisthesis at C4-5. Vertebrae: No fracture, evidence of discitis, or bone lesion. Cord: Normal signal and morphology. Posterior Fossa, vertebral  arteries, paraspinal tissues: Negative. Disc levels: C1-2: Unremarkable. C2-3: Normal disc space and facet joints. There is no spinal canal stenosis. No neural foraminal stenosis. C3-4: Small disc bulge with bilateral uncovertebral hypertrophy. There is no spinal canal stenosis. Moderate bilateral neural foraminal stenosis. C4-5: Small disc bulge and bilateral uncovertebral hypertrophy. There is no spinal canal stenosis. Moderate  bilateral neural foraminal stenosis. C5-6: Small disc bulge with mild uncovertebral hypertrophy. Mild spinal canal stenosis. Mild bilateral neural foraminal stenosis. C6-7: Small disc bulge with uncovertebral hypertrophy. There is no spinal canal stenosis. No neural foraminal stenosis. C7-T1: Normal disc space and facet joints. There is no spinal canal stenosis. No neural foraminal stenosis. IMPRESSION: 1. No acute abnormality of the cervical spine. 2. Mild C5-6 spinal canal stenosis. 3. Moderate bilateral C3-4 and C4-5 neural foraminal stenosis. Electronically Signed   By: Deatra Robinson M.D.   On: 08/21/2023 02:55   MR BRAIN WO CONTRAST  Result Date: 08/21/2023 CLINICAL DATA:  Acute neurologic deficits EXAM: MRI HEAD WITHOUT CONTRAST TECHNIQUE: Multiplanar, multiecho pulse sequences of the brain and surrounding structures were obtained without intravenous contrast. COMPARISON:  None Available. FINDINGS: Brain: No acute infarct, mass effect or extra-axial collection. No acute or chronic hemorrhage. There is multifocal hyperintense T2-weighted signal within the white matter. Parenchymal volume and CSF spaces are normal. The midline structures are normal. Vascular: Normal flow voids. Skull and upper cervical spine: Grade 1 anterolisthesis at C2-3. Normal calvarium. Sinuses/Orbits:No paranasal sinus fluid levels or advanced mucosal thickening. No mastoid or middle ear effusion. Normal orbits. Ocular lens replacements. IMPRESSION: 1. No acute intracranial abnormality. 2. Findings of  chronic small vessel ischemia. Electronically Signed   By: Deatra Robinson M.D.   On: 08/21/2023 02:30   CT Head Wo Contrast  Result Date: 08/20/2023 CLINICAL DATA:  Trauma EXAM: CT HEAD WITHOUT CONTRAST CT CERVICAL SPINE WITHOUT CONTRAST TECHNIQUE: Multidetector CT imaging of the head and cervical spine was performed following the standard protocol without intravenous contrast. Multiplanar CT image reconstructions of the cervical spine were also generated. RADIATION DOSE REDUCTION: This exam was performed according to the departmental dose-optimization program which includes automated exposure control, adjustment of the mA and/or kV according to patient size and/or use of iterative reconstruction technique. COMPARISON:  None Available. FINDINGS: CT HEAD FINDINGS Brain: There is no mass, hemorrhage or extra-axial collection. The size and configuration of the ventricles and extra-axial CSF spaces are normal. There is hypoattenuation of the periventricular white matter, most commonly indicating chronic ischemic microangiopathy. Vascular: No abnormal hyperdensity of the major intracranial arteries or dural venous sinuses. No intracranial atherosclerosis. Skull: The visualized skull base, calvarium and extracranial soft tissues are normal. Sinuses/Orbits: No fluid levels or advanced mucosal thickening of the visualized paranasal sinuses. No mastoid or middle ear effusion. The orbits are normal. CT CERVICAL SPINE FINDINGS Alignment: Anterolisthesis at C2-3.  Grade 1 retrolisthesis at C4-5. Skull base and vertebrae: No acute fracture. Soft tissues and spinal canal: No prevertebral fluid or swelling. No visible canal hematoma. Disc levels: Multilevel degenerative disc disease. No high-grade spinal canal stenosis. Multilevel neural foraminal narrowing, greatest at C4-5. Upper chest: No pneumothorax, pulmonary nodule or pleural effusion. Other: Calcific atherosclerosis of the left greater than right carotid system.  IMPRESSION: 1. No acute intracranial abnormality. 2. No acute fracture or static subluxation of the cervical spine. 3. Multilevel degenerative disc disease. Electronically Signed   By: Deatra Robinson M.D.   On: 08/20/2023 22:01   CT Cervical Spine Wo Contrast  Result Date: 08/20/2023 CLINICAL DATA:  Trauma EXAM: CT HEAD WITHOUT CONTRAST CT CERVICAL SPINE WITHOUT CONTRAST TECHNIQUE: Multidetector CT imaging of the head and cervical spine was performed following the standard protocol without intravenous contrast. Multiplanar CT image reconstructions of the cervical spine were also generated. RADIATION DOSE REDUCTION: This exam was performed according to the departmental dose-optimization program which includes automated exposure  control, adjustment of the mA and/or kV according to patient size and/or use of iterative reconstruction technique. COMPARISON:  None Available. FINDINGS: CT HEAD FINDINGS Brain: There is no mass, hemorrhage or extra-axial collection. The size and configuration of the ventricles and extra-axial CSF spaces are normal. There is hypoattenuation of the periventricular white matter, most commonly indicating chronic ischemic microangiopathy. Vascular: No abnormal hyperdensity of the major intracranial arteries or dural venous sinuses. No intracranial atherosclerosis. Skull: The visualized skull base, calvarium and extracranial soft tissues are normal. Sinuses/Orbits: No fluid levels or advanced mucosal thickening of the visualized paranasal sinuses. No mastoid or middle ear effusion. The orbits are normal. CT CERVICAL SPINE FINDINGS Alignment: Anterolisthesis at C2-3.  Grade 1 retrolisthesis at C4-5. Skull base and vertebrae: No acute fracture. Soft tissues and spinal canal: No prevertebral fluid or swelling. No visible canal hematoma. Disc levels: Multilevel degenerative disc disease. No high-grade spinal canal stenosis. Multilevel neural foraminal narrowing, greatest at C4-5. Upper chest: No  pneumothorax, pulmonary nodule or pleural effusion. Other: Calcific atherosclerosis of the left greater than right carotid system. IMPRESSION: 1. No acute intracranial abnormality. 2. No acute fracture or static subluxation of the cervical spine. 3. Multilevel degenerative disc disease. Electronically Signed   By: Deatra Robinson M.D.   On: 08/20/2023 22:01   DG Pelvis Portable  Result Date: 08/20/2023 CLINICAL DATA:  Fall EXAM: PORTABLE PELVIS 1-2 VIEWS COMPARISON:  None Available. FINDINGS: There is no evidence of displaced pelvic fracture or diastasis. No pelvic bone lesions are seen. IMPRESSION: No displaced pelvic fracture or dislocation. Electronically Signed   By: Jearld Lesch M.D.   On: 08/20/2023 21:43   DG Shoulder Left Portable  Result Date: 08/20/2023 CLINICAL DATA:  Questionable sepsis EXAM: LEFT SHOULDER COMPARISON:  05/23/2023 FINDINGS: No fracture or dislocation of the left shoulder. Mild acromioclavicular and glenohumeral arthrosis with high riding position of the humerus, suggesting chronic rotator cuff tear. IMPRESSION: 1. No fracture or dislocation of the left shoulder. 2. Mild acromioclavicular and glenohumeral arthrosis with high riding position of the humerus, suggesting chronic rotator cuff tear. Electronically Signed   By: Jearld Lesch M.D.   On: 08/20/2023 21:36   DG Knee Complete 4 Views Right  Result Date: 08/20/2023 CLINICAL DATA:  Questionable sepsis EXAM: RIGHT KNEE - COMPLETE 4+ VIEW; LEFT KNEE - COMPLETE 4+ VIEW COMPARISON:  None Available. FINDINGS: No fracture or dislocation of the bilateral knees. Severe, bone-on-bone tricompartmental arthrosis, worst in the patellofemoral compartments. No knee joint effusions. Soft tissues unremarkable. IMPRESSION: 1. No fracture or dislocation of the bilateral knees. 2. Severe, bone-on-bone tricompartmental arthrosis, worst in the patellofemoral compartments. 3. No knee joint effusions. Electronically Signed   By: Jearld Lesch  M.D.   On: 08/20/2023 21:34   DG Knee Complete 4 Views Left  Result Date: 08/20/2023 CLINICAL DATA:  Questionable sepsis EXAM: RIGHT KNEE - COMPLETE 4+ VIEW; LEFT KNEE - COMPLETE 4+ VIEW COMPARISON:  None Available. FINDINGS: No fracture or dislocation of the bilateral knees. Severe, bone-on-bone tricompartmental arthrosis, worst in the patellofemoral compartments. No knee joint effusions. Soft tissues unremarkable. IMPRESSION: 1. No fracture or dislocation of the bilateral knees. 2. Severe, bone-on-bone tricompartmental arthrosis, worst in the patellofemoral compartments. 3. No knee joint effusions. Electronically Signed   By: Jearld Lesch M.D.   On: 08/20/2023 21:34   DG Chest Port 1 View  Result Date: 08/20/2023 CLINICAL DATA:  Questionable sepsis EXAM: PORTABLE CHEST 1 VIEW COMPARISON:  05/17/2023 FINDINGS: Gross cardiomegaly. Left chest multi lead pacer.  Mild diffuse interstitial opacity. No acute osseous findings. Unchanged, chronic, resorbed fractures of the proximal right humerus. IMPRESSION: Gross cardiomegaly with mild diffuse interstitial opacity, consistent with edema or atypical/viral infection. No focal airspace opacity. Electronically Signed   By: Jearld Lesch M.D.   On: 08/20/2023 21:30    Pending Labs Unresulted Labs (From admission, onward)     Start     Ordered   08/22/23 0500  Lipid panel  Tomorrow morning,   R        08/21/23 0113   08/22/23 0500  Hemoglobin A1c  Tomorrow morning,   R        08/21/23 0113   08/22/23 0500  Iron and TIBC  Tomorrow morning,   R        08/21/23 0120   08/22/23 0500  Folate  Tomorrow morning,   R        08/21/23 0120   08/22/23 0500  Ferritin  Tomorrow morning,   R        08/21/23 0120   08/22/23 0500  Transferrin  Tomorrow morning,   R        08/21/23 0120   08/22/23 0500  Vitamin B12  Tomorrow morning,   R        08/21/23 0120   08/20/23 2320  Urinalysis, Complete w Microscopic -Urine, Catheterized  Once,   R       Comments: Repeat  UA, initial was contaminated   Question:  Specimen Source  Answer:  Urine, Catheterized   08/20/23 2320   08/20/23 2302  Urine Culture  Once,   URGENT       Question Answer Comment  Indication Dysuria   Patient immune status Normal   Release to patient Immediate      08/20/23 2301   08/20/23 2034  Blood Culture (routine x 2)  (Undifferentiated presentation (screening labs and basic nursing orders))  BLOOD CULTURE X 2,   STAT      08/20/23 2035            Vitals/Pain Today's Vitals   08/21/23 0600 08/21/23 0630 08/21/23 0700 08/21/23 0730  BP: (!) 113/47 (!) 108/45 (!) 116/51 (!) 105/42  Pulse: (!) 59 66 60 (!) 50  Resp:      Temp:      TempSrc:      SpO2: 93% 93% 95% 93%  Weight:      Height:        Isolation Precautions No active isolations  Medications Medications  acetaminophen (TYLENOL) tablet 1,000 mg (has no administration in time range)  oxyCODONE (Oxy IR/ROXICODONE) immediate release tablet 2.5 mg (2.5 mg Oral Given 08/21/23 0307)  allopurinol (ZYLOPRIM) tablet 50 mg (has no administration in time range)  rifaximin (XIFAXAN) tablet 550 mg (has no administration in time range)  pravastatin (PRAVACHOL) tablet 20 mg (has no administration in time range)  levothyroxine (SYNTHROID) tablet 50 mcg (50 mcg Oral Given 08/21/23 0534)  lactulose (CHRONULAC) 10 GM/15ML solution 10 g (has no administration in time range)  pantoprazole (PROTONIX) EC tablet 40 mg (has no administration in time range)  polyethylene glycol (MIRALAX / GLYCOLAX) packet 17 g (has no administration in time range)  midodrine (PROAMATINE) tablet 5 mg (5 mg Oral Given 08/21/23 0307)  lactated ringers bolus 1,000 mL (0 mLs Intravenous Stopped 08/21/23 0027)  fentaNYL (SUBLIMAZE) injection 50 mcg (50 mcg Intravenous Given 08/20/23 2146)  sodium chloride 0.9 % bolus 1,000 mL (0 mLs Intravenous Stopped 08/21/23 0755)  Mobility walks     Focused  Assessments Musculoskeletal   R Recommendations: See Admitting Provider Note  Report given to:   Additional Notes:  Speaks also in [place] native language

## 2023-08-21 NOTE — Evaluation (Signed)
Occupational Therapy Evaluation Patient Details Name: Katelyn Jackson MRN: [id number] DOB: Mar 15, 1946 Today's Date: 08/21/2023   History of Present Illness 77 y.o. female,  Russian-speaking, who was brought in as a level 2 trauma after ground-level fall with no acute traumatic injuries.  However has signs and symptoms consistent with stroke, including R sided weakness; stroke workup in progress; REcently at SNF for rehab post fall with L clavcle and knee fxs; with hx of permanent atrial fibrillation, s/p PPM on AC, decompensated cirrhosis, CKD stage III, HTN, HLD, hypothyroidism, chronic lymphedema, history of breast cancer with radiation therapy, chronic urinary obstruction with chronic indwelling foley catheter   Clinical Impression   Patient admitted for the diagnosis above.  PTA she lives with her son and DIL, who provide a lot of physical assist for ADL, iADL and mobility.  Patient is not interested in SNF stay for rehab, as she just returned home from a short rehab stay.  If family can provide the needed assist, home is a possibility.  Patient will benefit from continued inpatient follow up therapy, <3 hours/day.  OT will continue efforts in the acute setting to address deficits.          If plan is discharge home, recommend the following:      Functional Status Assessment  Patient has had a recent decline in their functional status and demonstrates the ability to make significant improvements in function in a reasonable and predictable amount of time.  Equipment Recommendations  None recommended by OT    Recommendations for Other Services       Precautions / Restrictions Precautions Precautions: Fall Precaution Comments: Limited UE ROM, was able to hold onto her RW Restrictions Weight Bearing Restrictions: No Other Position/Activity Restrictions: OK to weight bear through LUE and L LE without KI as of 10/11, per Ortho outpt note.  Patient stating she no longer using sling to  L UE.      Mobility Bed Mobility Overal bed mobility: Needs Assistance Bed Mobility: Supine to Sit, Sit to Supine     Supine to sit: Mod assist, +2 for physical assistance Sit to supine: Max assist, +2 for physical assistance        Transfers Overall transfer level: Needs assistance Equipment used: Rolling walker (2 wheels)   Sit to Stand: Max assist, From elevated surface                  Balance Overall balance assessment: Needs assistance Sitting-balance support: Feet supported Sitting balance-Leahy Scale: Fair     Standing balance support: Reliant on assistive device for balance, Bilateral upper extremity supported Standing balance-Leahy Scale: Poor                             ADL either performed or assessed with clinical judgement   ADL       Grooming: Wash/dry hands;Wash/dry face;Minimal assistance;Bed level   Upper Body Bathing: Moderate assistance;Bed level       Upper Body Dressing : Moderate assistance;Bed level       Toilet Transfer: +2 for physical assistance;Stand-pivot;Maximal assistance                   Vision Patient Visual Report: No change from baseline       Perception Perception: Not tested       Praxis Praxis: Not tested       Pertinent Vitals/Pain Pain Assessment Faces Pain Scale: Hurts little more Pain  Location: L shoulder and L knee Pain Descriptors / Indicators: Grimacing, Tender, Tightness Pain Intervention(s): Monitored during session     Extremity/Trunk Assessment Upper Extremity Assessment Upper Extremity Assessment: Left hand dominant;RUE deficits/detail;LUE deficits/detail RUE Deficits / Details: Can make a fist and extend her elbow, very limitied shoulder AROM.  Cannot touch her chin. RUE: Shoulder pain with ROM RUE Sensation: WNL RUE Coordination: WNL LUE Deficits / Details: Better AROM to LUE, can touch her mouth and nose, but again, very limited shoulder AROM LUE Sensation:  WNL LUE Coordination: WNL   Lower Extremity Assessment Lower Extremity Assessment: Defer to PT evaluation       Communication     Cognition Arousal: Alert Behavior During Therapy: WFL for tasks assessed/performed Overall Cognitive Status: Within Functional Limits for tasks assessed                                       General Comments   VSS on RA    Exercises     Shoulder Instructions      Home Living Family/patient expects to be discharged to:: Private residence Living Arrangements: Children Available Help at Discharge: Family;Available PRN/intermittently Type of Home: House Home Access: Ramped entrance     Home Layout: Multi-level;Able to live on main level with bedroom/bathroom     Bathroom Shower/Tub: Chief Strategy Officer: Standard Bathroom Accessibility: No   Home Equipment: Tub bench;Wheelchair - Forensic psychologist (2 wheels);Rollator (4 wheels)          Prior Functioning/Environment Prior Level of Function : Needs assist             Mobility Comments: Uses wheelchair mostly since home for recent rehab stay at SNF; Walking very short distances with RW, Doorframe of Bathroom to shower/toilet with assist. ADLs Comments: States her son and DIL assist with bathing and dressing.  DIL completes meals and iADL, son assists with community mobility and transfers.        OT Problem List: Decreased strength;Decreased range of motion;Decreased activity tolerance;Impaired balance (sitting and/or standing);Pain;Obesity      OT Treatment/Interventions: Self-care/ADL training;Therapeutic activities;Patient/family education;DME and/or AE instruction;Balance training    OT Goals(Current goals can be found in the care plan section) Acute Rehab OT Goals Patient Stated Goal: Return home OT Goal Formulation: With patient Time For Goal Achievement: 09/04/23 Potential to Achieve Goals: Good ADL Goals Pt Will Perform Grooming: with  set-up;sitting Pt Will Perform Upper Body Bathing: with min assist;sitting Pt Will Perform Upper Body Dressing: with min assist;sitting Pt Will Transfer to Toilet: with supervision;stand pivot transfer;regular height toilet  OT Frequency: Min 1X/week    Co-evaluation              AM-PAC OT "6 Clicks" Daily Activity     Outcome Measure Help from another person eating meals?: A Little Help from another person taking care of personal grooming?: A Lot Help from another person toileting, which includes using toliet, bedpan, or urinal?: Total Help from another person bathing (including washing, rinsing, drying)?: A Lot Help from another person to put on and taking off regular upper body clothing?: A Lot Help from another person to put on and taking off regular lower body clothing?: Total 6 Click Score: 11   End of Session Equipment Utilized During Treatment: Gait belt;Rolling walker (2 wheels) Nurse Communication: Mobility status  Activity Tolerance: Patient limited by pain Patient left: in bed;with  call bell/phone within reach  OT Visit Diagnosis: Unsteadiness on feet (R26.81);Muscle weakness (generalized) (M62.81)                Time: 9562-1308 OT Time Calculation (min): 17 min Charges:  OT General Charges $OT Visit: 1 Visit OT Evaluation $OT Eval Moderate Complexity: 1 Mod  08/21/2023  RP, OTR/L  Acute Rehabilitation Services  Office:  [phone]   Suzanna Obey 08/21/2023, 2:38 PM

## 2023-08-21 NOTE — ED Notes (Signed)
Pt hypotensive. MD aware and orders carried out

## 2023-08-21 NOTE — Progress Notes (Addendum)
PROGRESS NOTE  Katelyn Jackson  OEV:[id number] DOB: [birth year] DOA: 08/20/2023 PCP: Tresa Garter, MD   Brief Narrative: Patient is a 24 Russian-speaking female with history of permanent A-fib on anticoagulation, status post pacemaker, decompensated liver cirrhosis, CKD stage 2 hypertension, hyperlipidemia, hypothyroidism, chronic lymphedema, history of breast cancer status post radiation therapy, recent trauma with fracture of left upper and lower extremities, wheelchair dependent who was brought from home after a level 2 trauma following ground-level fall.  She lives with her son.  Patient noted right-sided numbness involving her right face, arm and leg with left-sided weakness before fall.  Suspected stroke on admission but MRI of the brain and cervical spine did not show any acute findings.  PT/OT evaluation pending.  Assessment & Plan:  Principal Problem:   Stroke-like symptoms  Fall/strokelike symptoms: Fell at home.  Developed right-sided numbness/weakness before fell.  MRI of the brain, cervical spine did not show any acute findings but showed mild C5-6 spinal canal stenosis,moderate bilateral C3-4 and C4-5 neural foraminal stenosis.  Her skeletal survey did not show any acute findings.  PT/OT consulted. Patient has severe debility.  She recently has  hospitalization from  07/10 to 05/24/23 for mechanical fall with left patellar and left distal clavicle fracture.  Recently had surgery of the right shoulder.  Cannot move right shoulder, left shoulder, left lower extremity.  Ambulates with the help of wheelchair.  She does not have focal deficits.  She does not have weakness of the right lower extremity.  Currently alert and oriented.  Denies any numbness or weakness.  AKI on CKD stage II: Baseline creatinine around 1.1.  Presented with creatinine in the range of 2.  Given IV fluid.  Check BMP.  Will hold torsemide and spironolactone for now  Acute on chronic microcytic anemia:  Baseline hemoglobin around 9-11.  Hemoglobin dropped to the range of 7.9, likely from hemodilution.  Will check FOBT.  No history of hematochezia, melena. follow-up iron panel.  Monitor hemoglobin  Chronic hypotension: On midodrine therapy.  Was given a liter of Ringer's lactate bolus in the ED.  Currently blood pressure stable.  Asymptomatic bacteriuria: Hold off antibiotics for now.  No report of dysuria. She previously had chronic indwelling Foley catheter, not now  History of A-fib on anticoagulation/pacemaker placement: Currently on Eliquis.  Coreg on hold because of soft blood pressure.  Currently she is in normal sinus rhythm.  History of cirrhosis: On lactulose, rifaximin at home.  Hyperlipidemia: On statin at home  Gout: On allopurinol.          DVT prophylaxis:Eliquis     Code Status: Limited: Do not attempt resuscitation (DNR) -DNR-LIMITED -Do Not Intubate/DNI   Family Communication: None at bedside  Patient status:Inpatient  Patient is from :Home  Anticipated discharge to: Home vs SnF  Estimated DC date:1-2 days   Consultants: None  Procedures:None  Antimicrobials:  Anti-infectives (From admission, onward)    Start     Dose/Rate Route Frequency Ordered Stop   08/21/23 1000  rifaximin (XIFAXAN) tablet 550 mg        550 mg Oral 2 times daily 08/21/23 0013     08/20/23 2315  cefTRIAXone (ROCEPHIN) 1 g in sodium chloride 0.9 % 100 mL IVPB  Status:  Discontinued        1 g 200 mL/hr over 30 Minutes Intravenous  Once 08/20/23 2302 08/20/23 2319       Subjective: Patient seen and examined at bedside this morning.  She was comfortably  lying in bed.  She speaks English too.  Her speech is clear.  She is alert and oriented.  She denies any numbness or new weakness.  She is chronically debilitated, ambulates up with the help of wheelchair, cannot really move her right upper extremity, left lower /upper extremity.  Objective: Vitals:   08/21/23 0600 08/21/23  0630 08/21/23 0700 08/21/23 0730  BP: (!) 113/47 (!) 108/45 (!) 116/51 (!) 105/42  Pulse: (!) 59 66 60 (!) 50  Resp:    17  Temp:      TempSrc:      SpO2: 93% 93% 95% 93%  Weight:      Height:        Intake/Output Summary (Last 24 hours) at 08/21/2023 0454 Last data filed at 08/21/2023 0755 Gross per 24 hour  Intake 2000 ml  Output --  Net 2000 ml   Filed Weights   08/20/23 2036  Weight: 92 kg    Examination:  General exam: Overall comfortable, not in distress, deconditioned, morbidly obese HEENT: PERRL Respiratory system:  no wheezes or crackles  Cardiovascular system: S1 & S2 heard, RRR.  Gastrointestinal system: Abdomen is nondistended, soft and nontender. Central nervous system: Alert and oriented Extremities: Bilateral lower extremity chronic edema, no clubbing ,no cyanosis, chronic weakness of right upper extremity, left upper and lower extremity Skin: No rashes, no ulcers,no icterus     Data Reviewed: I have personally reviewed following labs and imaging studies  CBC: Recent Labs  Lab 08/20/23 2045 08/21/23 0028 08/21/23 0237  WBC 7.7 6.9  --   NEUTROABS 5.7  --   --   HGB 9.3* 7.7* 7.9*  HCT 30.2* 25.5* 26.3*  MCV 98.7 100.4*  --   PLT 143* 140*  --    Basic Metabolic Panel: Recent Labs  Lab 08/20/23 2045 08/21/23 0028  NA 141 143  K 3.5 3.4*  CL 101 104  CO2 27 26  GLUCOSE 117* 106*  BUN 44* 41*  CREATININE 2.04* 2.00*  CALCIUM 9.0 9.2  MG  --  2.7*  PHOS  --  3.2     Recent Results (from the past 240 hour(s))  Blood Culture (routine x 2)     Status: None (Preliminary result)   Collection Time: 08/20/23  8:45 PM   Specimen: BLOOD  Result Value Ref Range Status   Specimen Description BLOOD RIGHT ANTECUBITAL  Final   Special Requests   Final    BOTTLES DRAWN AEROBIC AND ANAEROBIC Blood Culture results may not be optimal due to an excessive volume of blood received in culture bottles   Culture   Final    NO GROWTH < 12  HOURS Performed at [facility], [address], [place], [place] [address]    Report Status PENDING  Incomplete  Blood Culture (routine x 2)     Status: None (Preliminary result)   Collection Time: 08/20/23  9:21 PM   Specimen: BLOOD  Result Value Ref Range Status   Specimen Description BLOOD LEFT ANTECUBITAL  Final   Special Requests   Final    BOTTLES DRAWN AEROBIC ONLY Blood Culture results may not be optimal due to an inadequate volume of blood received in culture bottles   Culture   Final    NO GROWTH < 12 HOURS Performed at [facility], [address] [place], [place] [address]    Report Status PENDING  Incomplete     Radiology Studies: MR Cervical Spine Wo Contrast  Result Date: 08/21/2023 CLINICAL DATA:  Trauma EXAM: MRI CERVICAL SPINE WITHOUT CONTRAST TECHNIQUE: Multiplanar, multisequence MR imaging of the cervical spine was performed. No intravenous contrast was administered. COMPARISON:  None Available. FINDINGS: Alignment: Grade 1 anterolisthesis at C2-3 and grade 1 retrolisthesis at C4-5. Vertebrae: No fracture, evidence of discitis, or bone lesion. Cord: Normal signal and morphology. Posterior Fossa, vertebral arteries, paraspinal tissues: Negative. Disc levels: C1-2: Unremarkable. C2-3: Normal disc space and facet joints. There is no spinal canal stenosis. No neural foraminal stenosis. C3-4: Small disc bulge with bilateral uncovertebral hypertrophy. There is no spinal canal stenosis. Moderate bilateral neural foraminal stenosis. C4-5: Small disc bulge and bilateral uncovertebral hypertrophy. There is no spinal canal stenosis. Moderate bilateral neural foraminal stenosis. C5-6: Small disc bulge with mild uncovertebral hypertrophy. Mild spinal canal stenosis. Mild bilateral neural foraminal stenosis. C6-7: Small disc bulge with uncovertebral hypertrophy. There is no spinal canal stenosis. No neural foraminal stenosis. C7-T1: Normal disc space and facet joints. There  is no spinal canal stenosis. No neural foraminal stenosis. IMPRESSION: 1. No acute abnormality of the cervical spine. 2. Mild C5-6 spinal canal stenosis. 3. Moderate bilateral C3-4 and C4-5 neural foraminal stenosis. Electronically Signed   By: Deatra Robinson M.D.   On: 08/21/2023 02:55   MR BRAIN WO CONTRAST  Result Date: 08/21/2023 CLINICAL DATA:  Acute neurologic deficits EXAM: MRI HEAD WITHOUT CONTRAST TECHNIQUE: Multiplanar, multiecho pulse sequences of the brain and surrounding structures were obtained without intravenous contrast. COMPARISON:  None Available. FINDINGS: Brain: No acute infarct, mass effect or extra-axial collection. No acute or chronic hemorrhage. There is multifocal hyperintense T2-weighted signal within the white matter. Parenchymal volume and CSF spaces are normal. The midline structures are normal. Vascular: Normal flow voids. Skull and upper cervical spine: Grade 1 anterolisthesis at C2-3. Normal calvarium. Sinuses/Orbits:No paranasal sinus fluid levels or advanced mucosal thickening. No mastoid or middle ear effusion. Normal orbits. Ocular lens replacements. IMPRESSION: 1. No acute intracranial abnormality. 2. Findings of chronic small vessel ischemia. Electronically Signed   By: Deatra Robinson M.D.   On: 08/21/2023 02:30   CT Head Wo Contrast  Result Date: 08/20/2023 CLINICAL DATA:  Trauma EXAM: CT HEAD WITHOUT CONTRAST CT CERVICAL SPINE WITHOUT CONTRAST TECHNIQUE: Multidetector CT imaging of the head and cervical spine was performed following the standard protocol without intravenous contrast. Multiplanar CT image reconstructions of the cervical spine were also generated. RADIATION DOSE REDUCTION: This exam was performed according to the departmental dose-optimization program which includes automated exposure control, adjustment of the mA and/or kV according to patient size and/or use of iterative reconstruction technique. COMPARISON:  None Available. FINDINGS: CT HEAD FINDINGS  Brain: There is no mass, hemorrhage or extra-axial collection. The size and configuration of the ventricles and extra-axial CSF spaces are normal. There is hypoattenuation of the periventricular white matter, most commonly indicating chronic ischemic microangiopathy. Vascular: No abnormal hyperdensity of the major intracranial arteries or dural venous sinuses. No intracranial atherosclerosis. Skull: The visualized skull base, calvarium and extracranial soft tissues are normal. Sinuses/Orbits: No fluid levels or advanced mucosal thickening of the visualized paranasal sinuses. No mastoid or middle ear effusion. The orbits are normal. CT CERVICAL SPINE FINDINGS Alignment: Anterolisthesis at C2-3.  Grade 1 retrolisthesis at C4-5. Skull base and vertebrae: No acute fracture. Soft tissues and spinal canal: No prevertebral fluid or swelling. No visible canal hematoma. Disc levels: Multilevel degenerative disc disease. No high-grade spinal canal stenosis. Multilevel neural foraminal narrowing, greatest at C4-5. Upper chest: No pneumothorax, pulmonary nodule  or pleural effusion. Other: Calcific atherosclerosis of the left greater than right carotid system. IMPRESSION: 1. No acute intracranial abnormality. 2. No acute fracture or static subluxation of the cervical spine. 3. Multilevel degenerative disc disease. Electronically Signed   By: Deatra Robinson M.D.   On: 08/20/2023 22:01   CT Cervical Spine Wo Contrast  Result Date: 08/20/2023 CLINICAL DATA:  Trauma EXAM: CT HEAD WITHOUT CONTRAST CT CERVICAL SPINE WITHOUT CONTRAST TECHNIQUE: Multidetector CT imaging of the head and cervical spine was performed following the standard protocol without intravenous contrast. Multiplanar CT image reconstructions of the cervical spine were also generated. RADIATION DOSE REDUCTION: This exam was performed according to the departmental dose-optimization program which includes automated exposure control, adjustment of the mA and/or kV  according to patient size and/or use of iterative reconstruction technique. COMPARISON:  None Available. FINDINGS: CT HEAD FINDINGS Brain: There is no mass, hemorrhage or extra-axial collection. The size and configuration of the ventricles and extra-axial CSF spaces are normal. There is hypoattenuation of the periventricular white matter, most commonly indicating chronic ischemic microangiopathy. Vascular: No abnormal hyperdensity of the major intracranial arteries or dural venous sinuses. No intracranial atherosclerosis. Skull: The visualized skull base, calvarium and extracranial soft tissues are normal. Sinuses/Orbits: No fluid levels or advanced mucosal thickening of the visualized paranasal sinuses. No mastoid or middle ear effusion. The orbits are normal. CT CERVICAL SPINE FINDINGS Alignment: Anterolisthesis at C2-3.  Grade 1 retrolisthesis at C4-5. Skull base and vertebrae: No acute fracture. Soft tissues and spinal canal: No prevertebral fluid or swelling. No visible canal hematoma. Disc levels: Multilevel degenerative disc disease. No high-grade spinal canal stenosis. Multilevel neural foraminal narrowing, greatest at C4-5. Upper chest: No pneumothorax, pulmonary nodule or pleural effusion. Other: Calcific atherosclerosis of the left greater than right carotid system. IMPRESSION: 1. No acute intracranial abnormality. 2. No acute fracture or static subluxation of the cervical spine. 3. Multilevel degenerative disc disease. Electronically Signed   By: Deatra Robinson M.D.   On: 08/20/2023 22:01   DG Pelvis Portable  Result Date: 08/20/2023 CLINICAL DATA:  Fall EXAM: PORTABLE PELVIS 1-2 VIEWS COMPARISON:  None Available. FINDINGS: There is no evidence of displaced pelvic fracture or diastasis. No pelvic bone lesions are seen. IMPRESSION: No displaced pelvic fracture or dislocation. Electronically Signed   By: Jearld Lesch M.D.   On: 08/20/2023 21:43   DG Shoulder Left Portable  Result Date:  08/20/2023 CLINICAL DATA:  Questionable sepsis EXAM: LEFT SHOULDER COMPARISON:  05/23/2023 FINDINGS: No fracture or dislocation of the left shoulder. Mild acromioclavicular and glenohumeral arthrosis with high riding position of the humerus, suggesting chronic rotator cuff tear. IMPRESSION: 1. No fracture or dislocation of the left shoulder. 2. Mild acromioclavicular and glenohumeral arthrosis with high riding position of the humerus, suggesting chronic rotator cuff tear. Electronically Signed   By: Jearld Lesch M.D.   On: 08/20/2023 21:36   DG Knee Complete 4 Views Right  Result Date: 08/20/2023 CLINICAL DATA:  Questionable sepsis EXAM: RIGHT KNEE - COMPLETE 4+ VIEW; LEFT KNEE - COMPLETE 4+ VIEW COMPARISON:  None Available. FINDINGS: No fracture or dislocation of the bilateral knees. Severe, bone-on-bone tricompartmental arthrosis, worst in the patellofemoral compartments. No knee joint effusions. Soft tissues unremarkable. IMPRESSION: 1. No fracture or dislocation of the bilateral knees. 2. Severe, bone-on-bone tricompartmental arthrosis, worst in the patellofemoral compartments. 3. No knee joint effusions. Electronically Signed   By: Jearld Lesch M.D.   On: 08/20/2023 21:34   DG Knee Complete 4 Views Left  Result Date: 08/20/2023 CLINICAL DATA:  Questionable sepsis EXAM: RIGHT KNEE - COMPLETE 4+ VIEW; LEFT KNEE - COMPLETE 4+ VIEW COMPARISON:  None Available. FINDINGS: No fracture or dislocation of the bilateral knees. Severe, bone-on-bone tricompartmental arthrosis, worst in the patellofemoral compartments. No knee joint effusions. Soft tissues unremarkable. IMPRESSION: 1. No fracture or dislocation of the bilateral knees. 2. Severe, bone-on-bone tricompartmental arthrosis, worst in the patellofemoral compartments. 3. No knee joint effusions. Electronically Signed   By: Jearld Lesch M.D.   On: 08/20/2023 21:34   DG Chest Port 1 View  Result Date: 08/20/2023 CLINICAL DATA:  Questionable sepsis  EXAM: PORTABLE CHEST 1 VIEW COMPARISON:  05/17/2023 FINDINGS: Gross cardiomegaly. Left chest multi lead pacer. Mild diffuse interstitial opacity. No acute osseous findings. Unchanged, chronic, resorbed fractures of the proximal right humerus. IMPRESSION: Gross cardiomegaly with mild diffuse interstitial opacity, consistent with edema or atypical/viral infection. No focal airspace opacity. Electronically Signed   By: Jearld Lesch M.D.   On: 08/20/2023 21:30    Scheduled Meds:  allopurinol  50 mg Oral Daily   lactulose  10 g Oral TID   levothyroxine  50 mcg Oral Q0600   midodrine  5 mg Oral TID WC   pantoprazole  40 mg Oral Daily   pravastatin  20 mg Oral q1800   rifaximin  550 mg Oral BID   Continuous Infusions:   LOS: 1 day   Burnadette Pop, MD [facility] P10/14/2024, 8:22 AM

## 2023-08-21 NOTE — Progress Notes (Signed)
  Bedside nurse reported that patient has 11 rounds of V. tach then came back to paced rhythm.  No intervention needed. - Continue to monitor.  Tereasa Coop, MD [facility] 08/21/2023, 8:46 PM

## 2023-08-21 NOTE — Progress Notes (Signed)
SLP Cancellation Note  Patient Details Name: Katelyn Jackson MRN: [id number] DOB: September 18, 1946   Cancelled treatment:       Reason Eval/Treat Not Completed: SLP screened - swallow evaluation ordered but pt had not had swallow screen completed yet. Reached out to RN who performed [facility] swallow screen and said that pt passed. Will therefore hold our eval at this time. Please reorder if needed.    Mahala Menghini., M.A. CCC-SLP Acute Rehabilitation Services Office [phone]  Secure chat preferred  08/21/2023, 8:36 AM

## 2023-08-21 NOTE — Telephone Encounter (Signed)
Okay. Thank you.

## 2023-08-21 NOTE — Care Management Obs Status (Signed)
MEDICARE OBSERVATION STATUS NOTIFICATION   Patient Details  Name: Katelyn Jackson MRN: [id number] Date of Birth: [birth year]   Medicare Observation Status Notification Given:       Gordy Clement, RN 08/21/2023, 4:13 PM

## 2023-08-21 NOTE — ED Triage Notes (Signed)
Chief Complaint  Patient presents with   Fall   Pt presents to ED 29 via EMS from home with above.  Per report, pt started having trouble with right leg movement Friday.  Pt then fell out of recliner Saturday.  Today, pt fell while using restroom and hit her head.

## 2023-08-21 NOTE — Evaluation (Signed)
Physical Therapy Evaluation Patient Details Name: Katelyn Jackson MRN: [id number] DOB: [birth year] Today's Date: 08/21/2023  History of Present Illness  77 y.o. female,  Russian-speaking, who was brought in as a level 2 trauma after ground-level fall with no acute traumatic injuries.  However has signs and symptoms consistent with stroke, including R sided weakness; stroke workup in progress; REcently at SNF for rehab post fall with L clavcle and knee fxs; with hx of permanent atrial fibrillation, s/p PPM on AC, decompensated cirrhosis, CKD stage III, HTN, HLD, hypothyroidism, chronic lymphedema, history of breast cancer with radiation therapy, chronic urinary obstruction with chronic indwelling foley catheter  Clinical Impression   Pt admitted with above diagnosis. Lives at home with her son and dtr in law, in a multi-level home (can stay on bottom floor) with a ramped entrance; Prior to admission, pt was able to manage mobility at mostly wheelchair level, and walking short distances with RW; Presents to PT with with a functional decline compared to her baseline;  Able to get up to EOB on the stretcher with mod assist of 2, and stood form stretcher to RW with 2 person assist; no buckling observed in standing; Must at or very near modified independent to go back home; Patient will benefit from continued inpatient follow up therapy, <3 hours/dayPt currently with functional limitations due to the deficits listed below (see PT Problem List). Pt will benefit from skilled PT to increase their independence and safety with mobility to allow discharge to the venue listed below.           If plan is discharge home, recommend the following: Two people to help with walking and/or transfers;Two people to help with bathing/dressing/bathroom   Can travel by private vehicle   No    Equipment Recommendations Wheelchair cushion (measurements PT);Other (comment) (drop-arm BSC, sliding board)  Recommendations for  Other Services  OT consult;Other (comment) (Office of inclusion; consider making a specific time daily to work with [place])    Functional Status Assessment Patient has had a recent decline in their functional status and demonstrates the ability to make significant improvements in function in a reasonable and predictable amount of time.     Precautions / Restrictions Precautions Precautions: Fall Restrictions Weight Bearing Restrictions: No Other Position/Activity Restrictions: OK to weight bear through LUE and L LE without KI as of 10/11, per Ortho outpt note      Mobility  Bed Mobility Overal bed mobility: Needs Assistance Bed Mobility: Supine to Sit, Sit to Supine     Supine to sit: Mod assist, +2 for physical assistance Sit to supine: Max assist, +2 for physical assistance   General bed mobility comments: Cues for technique, and pt showing very nice initiation; heavy mod assist to square off hips at EOB (stretcher); Max assist of 2 to lay back down (high stretcher, and pt sitting at VERY edge)    Transfers Overall transfer level: Needs assistance Equipment used: Rolling walker (2 wheels) Transfers: Sit to/from Stand Sit to Stand: Mod assist, +2 safety/equipment           General transfer comment: Light mod assist from high surface of stretcher; palpable crepitus R knee; no buckling noted; more difficulty sitting back to high surface, needing extemly close guard/knee block once sitting back to the edge    Ambulation/Gait               General Gait Details: Attempted steps, marching in place at stretcher-side; difficulty unweighing either LE for stepping  Stairs            Wheelchair Mobility     Tilt Bed    Modified Rankin (Stroke Patients Only) Modified Rankin (Stroke Patients Only) Pre-Morbid Rankin Score: Moderately severe disability Modified Rankin: Severe disability     Balance Overall balance assessment: Needs assistance    Sitting balance-Leahy Scale: Fair       Standing balance-Leahy Scale: Poor                               Pertinent Vitals/Pain Pain Assessment Pain Assessment: Faces Faces Pain Scale: Hurts little more Pain Location: L shoulder and L knee Pain Descriptors / Indicators: Grimacing Pain Intervention(s): Repositioned, Limited activity within patient's tolerance, Monitored during session    Home Living Family/patient expects to be discharged to:: Private residence Living Arrangements: Children (son) Available Help at Discharge: Family;Available PRN/intermittently Type of Home: House Home Access: Ramped entrance       Home Layout: Multi-level;Able to live on main level with bedroom/bathroom Home Equipment: Tub bench;Wheelchair - Forensic psychologist (2 wheels);Rollator (4 wheels) Additional Comments: was home from recent rehab stay just a few days before R sided weakness ("I tell my leg to move and it doesn't"); Sounds like she uses her wheelchair mostly for mobility, and uses RW for bathroom access; WBAT LUE and LLE now per Ortho PA    Prior Function Prior Level of Function : Needs assist;History of Falls (last six months)             Mobility Comments: Uses wheelchair mostly since home for recent rehab stay at SNF; Walking very short distances with RW ADLs Comments: Uses RW for bathroom access     Extremity/Trunk Assessment   Upper Extremity Assessment Upper Extremity Assessment: Defer to OT evaluation    Lower Extremity Assessment Lower Extremity Assessment: Generalized weakness;RLE deficits/detail;LLE deficits/detail (Difficutly with lateral weights shifts in standing bilaterally) RLE Deficits / Details: Active movement noted about hip, ankle, knee; quad strength 2/5, able to accept weight in standing trial without buckling; palpable crepitus R knee LLE Deficits / Details: slightly limited in knee flex/ext L knee; accepting weight onto LLE during initail  standing trial       Communication   Communication Communication: No apparent difficulties;Other (comment) ([place] speaker; uses video interpreter well) Cueing Techniques: Verbal cues;Visual cues;Gestural cues  Cognition Arousal: Alert Behavior During Therapy: WFL for tasks assessed/performed Overall Cognitive Status: Within Functional Limits for tasks assessed                                          General Comments General comments (skin integrity, edema, etc.):   08/21/23 1027  Language Assistant  Interpreter Mode Video Remote Interpreter  Interpreter Name [place], [phone]       Exercises     Assessment/Plan    PT Assessment Patient needs continued PT services  PT Problem List Decreased strength;Decreased range of motion;Decreased activity tolerance;Decreased balance;Decreased mobility;Decreased coordination;Decreased knowledge of use of DME;Decreased safety awareness;Decreased knowledge of precautions;Pain;Obesity       PT Treatment Interventions DME instruction;Gait training;Functional mobility training;Therapeutic activities;Therapeutic exercise;Balance training;Neuromuscular re-education;Cognitive remediation;Patient/family education;Wheelchair mobility training    PT Goals (Current goals can be found in the Care Plan section)  Acute Rehab PT Goals Patient Stated Goal: Be able to move r leg well again PT Goal Formulation: With  patient Time For Goal Achievement: 09/04/23 Potential to Achieve Goals: Good    Frequency Min 1X/week     Co-evaluation               AM-PAC PT "6 Clicks" Mobility  Outcome Measure Help needed turning from your back to your side while in a flat bed without using bedrails?: A Lot Help needed moving from lying on your back to sitting on the side of a flat bed without using bedrails?: A Lot Help needed moving to and from a bed to a chair (including a wheelchair)?: Total Help needed standing up from a chair using  your arms (e.g., wheelchair or bedside chair)?: Total Help needed to walk in hospital room?: Total Help needed climbing 3-5 steps with a railing? : Total 6 Click Score: 8    End of Session Equipment Utilized During Treatment: Gait belt Activity Tolerance: Patient tolerated treatment well Patient left: in bed;with call bell/phone within reach Nurse Communication: Mobility status PT Visit Diagnosis: Unsteadiness on feet (R26.81);Other abnormalities of gait and mobility (R26.89);Muscle weakness (generalized) (M62.81);Difficulty in walking, not elsewhere classified (R26.2);Other symptoms and signs involving the nervous system (R29.898)    Time: 0981-1914 PT Time Calculation (min) (ACUTE ONLY): 46 min   Charges:   PT Evaluation $PT Eval Moderate Complexity: 1 Mod PT Treatments $Therapeutic Activity: 23-37 mins PT General Charges $$ ACUTE PT VISIT: 1 Visit         Van Clines, PT  Acute Rehabilitation Services Office [phone] Secure Chat welcomed   Levi Aland 08/21/2023, 1:43 PM

## 2023-08-21 NOTE — Care Management CC44 (Signed)
Condition Code 44 Documentation Completed  Patient Details  Name: Katelyn Jackson MRN: [id number] Date of Birth: 1946-06-11   Condition Code 44 given:  Yes Patient signature on Condition Code 44 notice:  Yes Documentation of 2 MD's agreement:  Yes Code 44 added to claim:  Yes    Gordy Clement, RN 08/21/2023, 4:13 PM

## 2023-08-21 NOTE — H&P (Addendum)
History and Physical    Katelyn Jackson OZD:[id number] DOB: 01-01-1946 DOA: 08/20/2023  PCP: Katelyn Garter, MD   Patient coming from: Home   Chief Complaint:  Chief Complaint  Patient presents with   Fall    HPI: History obtained with use of translator services, [place] speaking Katelyn Jackson is a 77 y.o. female , Russian-speaking, with hx of permanent atrial fibrillation, s/p PPM on AC, decompensated cirrhosis, CKD stage III, HTN, HLD, hypothyroidism, chronic lymphedema, history of breast cancer with radiation therapy, chronic urinary obstruction with chronic indwelling foley catheter, who was brought in as a level 2 trauma after ground-level fall. Patient reports that she was last at baseline Friday 10/11 at 6 AM. At that time when going to the bathroom noted symptoms of right sided numbness involving her R face, arm, and leg. Also with associated weakness. She also describes that it feels like "my tongue is not following directions". She had a ground level fall on Friday, then today had multiple ground level falls. The most recent was a fall in the bathroom, did strike her left temple. Has pain in the left shoulder and knee as well. Recently just got out of an immobilizer of the L knee. Otherwise no injuries. When discussing possible stroke she says, "yes that's what I think I have".    Review of Systems:  ROS complete and negative except as marked above   Allergies  Allergen Reactions   Hibiclens [Chlorhexidine Gluconate] Hives   Penicillins Itching    Tolerated Cephalosporin Date: 03/17/21. Td Ancef 2g 07/26/21    Prior to Admission medications   Medication Sig Start Date End Date Taking? Authorizing Provider  allopurinol (ZYLOPRIM) 100 MG tablet Take 0.5 tablets (50 mg total) by mouth daily. 11/23/22  Yes Jackson, Katelyn Quint, MD  apixaban (ELIQUIS) 5 MG TABS tablet Take 1 tablet (5 mg total) by mouth 2 (two) times daily. 11/23/22  Yes Jackson, Katelyn Quint, MD   bisacodyl (DULCOLAX) 10 MG suppository Place 10 mg rectally as needed for moderate constipation.   Yes [provider]  carvedilol (COREG) 12.5 MG tablet Take 12.5 mg by mouth 2 (two) times daily with a meal.   Yes [provider]  lactulose (CHRONULAC) 10 GM/15ML solution TAKE 15 MILLILITERS BY MOUTH THREE TIMESDAILY Patient taking differently: Take 10 g by mouth 3 (three) times daily. 02/10/23  Yes Jackson, Katelyn Quint, MD  levothyroxine (SYNTHROID) 50 MCG tablet Take 1 tablet (50 mcg total) by mouth daily before breakfast. Overdue for Annual appt must see provider for future refills 11/23/22  Yes Jackson, Katelyn Quint, MD  lovastatin (MEVACOR) 20 MG tablet Take 20 mg by mouth at bedtime.   Yes [provider]  magnesium oxide (MAG-OX) 400 MG tablet Take 1 tablet (400 mg total) by mouth 2 (two) times daily. 09/15/22  Yes Jackson, Katelyn Quint, MD  Nystatin (GERHARDT'S BUTT CREAM) CREA Apply 1 Application topically 2 (two) times daily. 06/15/23  Yes Katelyn Fallen, MD  Olopatadine HCl 0.2 % SOLN Place 1 drop into the right eye in the morning and at bedtime.   Yes [provider]  omeprazole (PRILOSEC) 40 MG capsule TAKE ONE CAPSULE BY MOUTH DAILY Patient taking differently: Take 40 mg by mouth daily before breakfast. 08/16/22  Yes Jackson, Katelyn Quint, MD  polyethylene glycol (MIRALAX / GLYCOLAX) 17 g packet Take 17 g by mouth 2 (two) times daily. 06/15/23  Yes Katelyn Fallen, MD  potassium chloride (KLOR-CON) 8 MEQ tablet TAKE ONE (  1) TABLET BY MOUTH EVERY DAY 11/30/22  Yes Jackson, Katelyn Quint, MD  rifaximin (XIFAXAN) 550 MG TABS tablet Take 1 tablet (550 mg total) by mouth 2 (two) times daily. 06/16/22  Yes Jackson, Katelyn Quint, MD  sennosides-docusate sodium (SENOKOT-S) 8.6-50 MG tablet Take 2 tablets by mouth every 12 (twelve) hours.   Yes [provider]  sorbitol-magnesium hydroxide-mineral oil-glycerin Place 960 mLs rectally 2 (two) times daily  as needed. 06/15/23  Yes Katelyn Fallen, MD  torsemide (DEMADEX) 100 MG tablet Take 1 tablet (100 mg total) by mouth 2 (two) times daily. Patient taking differently: Take 100 mg by mouth 2 (two) times daily. 11/25/21  Yes Jackson, Katelyn Quint, MD  triamcinolone cream (KENALOG) 0.1 % Apply topically 2 (two) times daily. Patient taking differently: Apply 1 Application topically 2 (two) times daily. To dry patches on BLE and bilateral feet 02/01/23  Yes Jackson, Katelyn Quint, MD  protein supplement (PROSOURCE NO CARB) LIQD Take 30 mLs by mouth 2 (two) times daily.    [provider]    Past Medical History:  Diagnosis Date   Arthritis    Breast lesion 12/07/2021   Cancer [facility])    breast cancer   Chronic atrial fibrillation (HCC)    Chronic kidney disease    sees Dr Katelyn Jackson   Colonization with VRE (vancomycin-resistant enterococcus) 09/22/2021   Cramps, muscle, general    Dry skin 09/22/2021   Dyspnea on exertion    Dysrhythmia    a-fib,    ESBL E. coli carrier 09/22/2021   GERD (gastroesophageal reflux disease)    Headache    Herpes labialis 08/19/2021   Hyperlipidemia    Hypertension    Hypothyroidism    Lymphedema    Moderate to severe pulmonary hypertension (HCC)    Obesity    Osteoarthritis of right knee 08/19/2021   Personal history of radiation therapy    Pneumonia 12/2020   Presence of permanent cardiac pacemaker 01/21/2021   for bradycardia    Syncope 12/2020   needed a pacemaker    Past Surgical History:  Procedure Laterality Date   APPENDECTOMY     BREAST BIOPSY Left 2018   BREAST LUMPECTOMY Left 04/04/2017   x3   BREAST LUMPECTOMY WITH NEEDLE LOCALIZATION AND AXILLARY SENTINEL LYMPH NODE BX Left 04/04/2017   Procedure: BREAST LUMPECTOMY WITH NEEDLE LOCALIZATION x3 AND AXILLARY SENTINEL LYMPH NODE BX;  Surgeon: Katelyn Lint, MD;  Location: [facility] OR;  Service: General;  Laterality: Left;   CATARACT EXTRACTION W/ INTRAOCULAR LENS  IMPLANT, BILATERAL  Bilateral 2018   COLONOSCOPY     EYE SURGERY     GLAUCOMA SURGERY Bilateral 2018   HARDWARE REMOVAL Right 07/26/2021   Procedure: RIGHT SHOULDER HARDWARE REMOVAL WITH WASHOUT;  Surgeon: Bjorn Pippin, MD;  Location: [facility] OR[facility];  Service: Orthopedics;  Laterality: Right;   INSERT / REPLACE / REMOVE PACEMAKER  01/21/2021   IR FLUORO GUIDE CV LINE LEFT  07/29/2021   IR REMOVAL TUN CV CATH W/O FL  09/07/2021   IR US GUIDE BX ASP/DRAIN  07/16/2021   RE-EXCISION OF BREAST CANCER,SUPERIOR MARGINS Left 04/26/2017   Procedure: RE-EXCISION OF LEFT BREAST CANCER;  Surgeon: Katelyn Lint, MD;  Location: [facility] OR;  Service: General;  Laterality: Left;   SHOULDER HEMI-ARTHROPLASTY Right 03/17/2021   Procedure: SHOULDER HEMI-ARTHROPLASTY;  Surgeon: Bjorn Pippin, MD;  Location: [facility];  Service: Orthopedics;  Laterality: Right;     reports that she has never smoked. She has never used  smokeless tobacco. She reports that she does not drink alcohol and does not use drugs.  Family History  Problem Relation Age of Onset   CAD Mother 91   Stroke Mother 38       hemorr CVA   COPD Father 15   Cancer Neg Hx      Physical Exam: Vitals:   08/20/23 2036 08/20/23 2100 08/20/23 2200 08/20/23 2244  BP:  (!) 123/53 (!) 105/47   Pulse:  63 (!) 50 63  Resp:  20 18 20   Temp:    98.4 F (36.9 C)  TempSrc:    Rectal  SpO2:  98% 95% 100%  Weight: 92 kg     Height: 5\' 1"  (1.549 m)       Gen: Awake, alert, chronically ill appearing  CV: Regular, normal S1, S2, 2/6 holosytolic murmur  Resp: Normal WOB, CTAB  Abd: Flat, normoactive, nontender MSK: Symmetric, there is lymphedema bilaterally. There is chronic deformity at both ankles. There is tenderness of the left knee and shoulder. ROM is restricted in both shoulders.  Skin: No rashes or lesions to exposed skin  Neuro: Alert and interactive, fully oriented, no asterixis, CN V with numbness in all divisions on the right, VII with minimal R lower facial weakness. Otherwise  II-XII intact bilaterally. Motor exam limited ? 2/2 shoulder pathology in the upper extremities, at least 4/5 bilaterally. Lower ext are 5/5 and symmetric. Sensation with CN V deficit as described, and R sided hypoesthesia in the arm, and numbness in the leg. No clonus at the ankles.  Psych: euthymic, appropriate    Data review:   Labs reviewed, notable for:   Lactate 0.9 Creatinine 2 Hemoglobin 9.3 down to 7.7 Initial UA appears contaminated 21-50 squamous cells  Micro:  Results for orders placed or performed during the hospital encounter of 05/17/23  Urine Culture (for pregnant, neutropenic or urologic patients or patients with an indwelling urinary catheter)     Status: Abnormal   Collection Time: 05/18/23  1:37 PM   Specimen: Urine, Clean Catch  Result Value Ref Range Status   Specimen Description URINE, CLEAN CATCH  Final   Special Requests NONE  Final   Culture (A)  Final    <10,000 COLONIES/mL INSIGNIFICANT GROWTH Performed at [facility], [address], [place], [place] [address]    Report Status 05/19/2023 FINAL  Final    Imaging reviewed:  CT Head Wo Contrast  Result Date: 08/20/2023 CLINICAL DATA:  Trauma EXAM: CT HEAD WITHOUT CONTRAST CT CERVICAL SPINE WITHOUT CONTRAST TECHNIQUE: Multidetector CT imaging of the head and cervical spine was performed following the standard protocol without intravenous contrast. Multiplanar CT image reconstructions of the cervical spine were also generated. RADIATION DOSE REDUCTION: This exam was performed according to the departmental dose-optimization program which includes automated exposure control, adjustment of the mA and/or kV according to patient size and/or use of iterative reconstruction technique. COMPARISON:  None Available. FINDINGS: CT HEAD FINDINGS Brain: There is no mass, hemorrhage or extra-axial collection. The size and configuration of the ventricles and extra-axial CSF spaces are normal. There is hypoattenuation of  the periventricular white matter, most commonly indicating chronic ischemic microangiopathy. Vascular: No abnormal hyperdensity of the major intracranial arteries or dural venous sinuses. No intracranial atherosclerosis. Skull: The visualized skull base, calvarium and extracranial soft tissues are normal. Sinuses/Orbits: No fluid levels or advanced mucosal thickening of the visualized paranasal sinuses. No mastoid or middle ear effusion. The orbits are normal. CT CERVICAL  SPINE FINDINGS Alignment: Anterolisthesis at C2-3.  Grade 1 retrolisthesis at C4-5. Skull base and vertebrae: No acute fracture. Soft tissues and spinal canal: No prevertebral fluid or swelling. No visible canal hematoma. Disc levels: Multilevel degenerative disc disease. No high-grade spinal canal stenosis. Multilevel neural foraminal narrowing, greatest at C4-5. Upper chest: No pneumothorax, pulmonary nodule or pleural effusion. Other: Calcific atherosclerosis of the left greater than right carotid system. IMPRESSION: 1. No acute intracranial abnormality. 2. No acute fracture or static subluxation of the cervical spine. 3. Multilevel degenerative disc disease. Electronically Signed   By: Deatra Robinson M.D.   On: 08/20/2023 22:01   CT Cervical Spine Wo Contrast  Result Date: 08/20/2023 CLINICAL DATA:  Trauma EXAM: CT HEAD WITHOUT CONTRAST CT CERVICAL SPINE WITHOUT CONTRAST TECHNIQUE: Multidetector CT imaging of the head and cervical spine was performed following the standard protocol without intravenous contrast. Multiplanar CT image reconstructions of the cervical spine were also generated. RADIATION DOSE REDUCTION: This exam was performed according to the departmental dose-optimization program which includes automated exposure control, adjustment of the mA and/or kV according to patient size and/or use of iterative reconstruction technique. COMPARISON:  None Available. FINDINGS: CT HEAD FINDINGS Brain: There is no mass, hemorrhage or  extra-axial collection. The size and configuration of the ventricles and extra-axial CSF spaces are normal. There is hypoattenuation of the periventricular white matter, most commonly indicating chronic ischemic microangiopathy. Vascular: No abnormal hyperdensity of the major intracranial arteries or dural venous sinuses. No intracranial atherosclerosis. Skull: The visualized skull base, calvarium and extracranial soft tissues are normal. Sinuses/Orbits: No fluid levels or advanced mucosal thickening of the visualized paranasal sinuses. No mastoid or middle ear effusion. The orbits are normal. CT CERVICAL SPINE FINDINGS Alignment: Anterolisthesis at C2-3.  Grade 1 retrolisthesis at C4-5. Skull base and vertebrae: No acute fracture. Soft tissues and spinal canal: No prevertebral fluid or swelling. No visible canal hematoma. Disc levels: Multilevel degenerative disc disease. No high-grade spinal canal stenosis. Multilevel neural foraminal narrowing, greatest at C4-5. Upper chest: No pneumothorax, pulmonary nodule or pleural effusion. Other: Calcific atherosclerosis of the left greater than right carotid system. IMPRESSION: 1. No acute intracranial abnormality. 2. No acute fracture or static subluxation of the cervical spine. 3. Multilevel degenerative disc disease. Electronically Signed   By: Deatra Robinson M.D.   On: 08/20/2023 22:01   DG Pelvis Portable  Result Date: 08/20/2023 CLINICAL DATA:  Fall EXAM: PORTABLE PELVIS 1-2 VIEWS COMPARISON:  None Available. FINDINGS: There is no evidence of displaced pelvic fracture or diastasis. No pelvic bone lesions are seen. IMPRESSION: No displaced pelvic fracture or dislocation. Electronically Signed   By: Jearld Lesch M.D.   On: 08/20/2023 21:43   DG Shoulder Left Portable  Result Date: 08/20/2023 CLINICAL DATA:  Questionable sepsis EXAM: LEFT SHOULDER COMPARISON:  05/23/2023 FINDINGS: No fracture or dislocation of the left shoulder. Mild acromioclavicular and  glenohumeral arthrosis with high riding position of the humerus, suggesting chronic rotator cuff tear. IMPRESSION: 1. No fracture or dislocation of the left shoulder. 2. Mild acromioclavicular and glenohumeral arthrosis with high riding position of the humerus, suggesting chronic rotator cuff tear. Electronically Signed   By: Jearld Lesch M.D.   On: 08/20/2023 21:36   DG Knee Complete 4 Views Right  Result Date: 08/20/2023 CLINICAL DATA:  Questionable sepsis EXAM: RIGHT KNEE - COMPLETE 4+ VIEW; LEFT KNEE - COMPLETE 4+ VIEW COMPARISON:  None Available. FINDINGS: No fracture or dislocation of the bilateral knees. Severe, bone-on-bone tricompartmental arthrosis, worst in  the patellofemoral compartments. No knee joint effusions. Soft tissues unremarkable. IMPRESSION: 1. No fracture or dislocation of the bilateral knees. 2. Severe, bone-on-bone tricompartmental arthrosis, worst in the patellofemoral compartments. 3. No knee joint effusions. Electronically Signed   By: Jearld Lesch M.D.   On: 08/20/2023 21:34   DG Knee Complete 4 Views Left  Result Date: 08/20/2023 CLINICAL DATA:  Questionable sepsis EXAM: RIGHT KNEE - COMPLETE 4+ VIEW; LEFT KNEE - COMPLETE 4+ VIEW COMPARISON:  None Available. FINDINGS: No fracture or dislocation of the bilateral knees. Severe, bone-on-bone tricompartmental arthrosis, worst in the patellofemoral compartments. No knee joint effusions. Soft tissues unremarkable. IMPRESSION: 1. No fracture or dislocation of the bilateral knees. 2. Severe, bone-on-bone tricompartmental arthrosis, worst in the patellofemoral compartments. 3. No knee joint effusions. Electronically Signed   By: Jearld Lesch M.D.   On: 08/20/2023 21:34   DG Chest Port 1 View  Result Date: 08/20/2023 CLINICAL DATA:  Questionable sepsis EXAM: PORTABLE CHEST 1 VIEW COMPARISON:  05/17/2023 FINDINGS: Gross cardiomegaly. Left chest multi lead pacer. Mild diffuse interstitial opacity. No acute osseous findings.  Unchanged, chronic, resorbed fractures of the proximal right humerus. IMPRESSION: Gross cardiomegaly with mild diffuse interstitial opacity, consistent with edema or atypical/viral infection. No focal airspace opacity. Electronically Signed   By: Jearld Lesch M.D.   On: 08/20/2023 21:30    EKG: V-paced  ED Course:  Imaging obtained per above.  Neurology consulted, recommending stroke evaluation with MRI brain.  Initially had plan to treat with ceftriaxone although I have canceled this for now while awaiting repeat urine studies.  Treated with 1 L LR   Assessment/Plan:  77 y.o. female with hx Russian-speaking, with hx of permanent atrial fibrillation, s/p PPM on AC, decompensated cirrhosis, CKD stage III, HTN, HLD, hypothyroidism, chronic lymphedema, history of breast cancer with radiation therapy, chronic urinary obstruction with chronic indwelling foley catheter, who was brought in as a level 2 trauma after ground-level fall with no acute traumatic injuries.  However has signs and symptoms consistent with stroke.   Concern for ischemic stroke LKWT 10/11 at 6AM with acute onset of R sided numbness and weakness. Subsequent recurrent ground level falls. She is out of the window for TNK or vessel intervention. Localization appears to be left sided lacunar / internal capsule. Etiology pending.  - Neurology consulted, and will follow  - MRI brain and C spine without contrast ordered by neuro  - Hold on antiPLT for now, holding eliquis pending MR - Check lipids and A1c - Currently on Lovastatin, sub for Pravastatin here; will decide on change to high intensity statin pending stroke eval  - Telemonitoring - Swallow screen then [facility] diet  - Permissive hypertension,-> started on midodrine due to hypotension in ED  - PT/OT/SLP - DVT prophylaxis per below  Hypotensive episode  In ED 90/40's with MAP 60 after admission.  - s/p 1 L LR, give additional 1 L IVF.  - Start on Midodrine 5 mg TID, first dose  now  - stat Hb, if continued drop -> scans per below   Ground-level fall, without traumatic injury CT head, C-spine, chest x-ray, bilateral knee x-ray, left shoulder x-ray without acute traumatic injuries.  Fall is mechanical due to her right-sided deficits -PT/OT per above -BP is borderline check orthostatics in a.m. -Symptomatic management Tylenol as needed, oxycodone 2.5 mg every 6 hours as needed.  MiraLAX prn to avoid OIC  AKI stage II Baseline creatinine approximately 1.1, elevated to 2 on admission.  Her volume  status is mixed with chest x-ray with possible interstitial edema.  However recently reports decreased p.o. intake -Status post 1 L IV fluid in ED, continue oral hydration -Check PVR  Anemia, acute on chronic, macrocytic  No history of bleeding; unclear if repeated hemoglobin with drop from 9.3 down to 7.7 is spurious.  - Repeat Hb, type and screen  - If continued drop consider imaging including C/A/P, thighs with her hx of GLF / trauma  - Check B12, folate, iron panel   Asymptomatic bacteriuria, versus contaminated urine sample -Hold off on antibiotics for now.  -Repeat UA via straight cath  Chronic medical problems: A-fib with pacemaker on AC: Hold eliquis pending MR, Hb eval.  Holding Coreg for permissive hypertension History of decompensated cirrhosis: Continue home lactulose, rifaximin.  Hold home diuretics with AKI and unclear volume status. Hypertension: Hold her home Coreg HLD: Continue her home levothyroxine Gout: Continue home allopurinol History of VRE, ESBL organism  Body mass index is 38.32 kg/m. Obesity class II affecting medical care per above    DVT prophylaxis:  Eliquis Code Status:  DNR/DNI(Do NOT Intubate); confirmed with the patient she wants to be DNR/DNI due to her overall chronic and progressive decline in health and does not wish to be on life support.   Diet:  Diet Orders (From admission, onward)     Start     Ordered   08/21/23 0008   Diet regular Room service appropriate? Yes; Fluid consistency: Thin  Diet effective now       Question Answer Comment  Room service appropriate? Yes   Fluid consistency: Thin      08/21/23 0010           Family Communication:  No   Consults:  Neurology   Admission status:   Inpatient, Telemetry bed  Severity of Illness: The appropriate patient status for this patient is INPATIENT. Inpatient status is judged to be reasonable and necessary in order to provide the required intensity of service to ensure the patient's safety. The patient's presenting symptoms, physical exam findings, and initial radiographic and laboratory data in the context of their chronic comorbidities is felt to place them at high risk for further clinical deterioration. Furthermore, it is not anticipated that the patient will be medically stable for discharge from the hospital within 2 midnights of admission.   * I certify that at the point of admission it is my clinical judgment that the patient will require inpatient hospital care spanning beyond 2 midnights from the point of admission due to high intensity of service, high risk for further deterioration and high frequency of surveillance required.*   Dolly Rias, MD [facility]  How to contact the [facility] Attending or Consulting provider 7A - 7P or covering provider during after hours 7P -7A, for this patient.  Check the care team in [facility] and look for a) attending/consulting TR[facility] provider listed and b) the [facility] team listed Log into [email or web address] and use [place]'s universal password to access. If you do not have the password, please contact the hospital operator. Locate the [facility] provider you are looking for under [facility] and page to a number that you can be directly reached. If you still have difficulty reaching the provider, please page the [facility] (Director on Call) for the Hospitalists listed on amion for assistance.  08/21/2023, 12:11 AM

## 2023-08-21 NOTE — TOC CAGE-AID Note (Signed)
Transition of Care [facility]) - CAGE-AID Screening   Patient Details  Name: Katelyn Jackson MRN: [id number] Date of Birth: [birth year]  Transition of Care [facility]) CM/SW Contact:    Katha Hamming, RN Phone Number: 08/21/2023, 10:07 AM   Clinical Narrative: No alcohol use, drug use, no resources indicated  CAGE-AID Screening:    Have You Ever Felt You Ought to Cut Down on Your Drinking or Drug Use?: No Have People Annoyed You By Critizing Your Drinking Or Drug Use?: No Have You Felt Bad Or Guilty About Your Drinking Or Drug Use?: No Have You Ever Had a Drink or Used Drugs First Thing In The Morning to Steady Your Nerves or to Get Rid of a Hangover?: No CAGE-AID Score: 0  Substance Abuse Education Offered: No

## 2023-08-21 NOTE — Telephone Encounter (Signed)
Katelyn Jackson  called from [facility] wanting verbals   Nursing 2 week 1 1 week 3  Home Health  1 week 4  Best called back number [phone]  Pt also has fallen 4 times within the week.

## 2023-08-22 ENCOUNTER — Inpatient Hospital Stay (HOSPITAL_COMMUNITY): Payer: Medicare HMO

## 2023-08-22 ENCOUNTER — Observation Stay (HOSPITAL_COMMUNITY): Payer: Medicare HMO

## 2023-08-22 DIAGNOSIS — I472 Ventricular tachycardia, unspecified: Secondary | ICD-10-CM | POA: Diagnosis not present

## 2023-08-22 DIAGNOSIS — R2990 Unspecified symptoms and signs involving the nervous system: Secondary | ICD-10-CM | POA: Diagnosis not present

## 2023-08-22 DIAGNOSIS — N1832 Chronic kidney disease, stage 3b: Secondary | ICD-10-CM | POA: Diagnosis present

## 2023-08-22 DIAGNOSIS — Z923 Personal history of irradiation: Secondary | ICD-10-CM | POA: Diagnosis not present

## 2023-08-22 DIAGNOSIS — E039 Hypothyroidism, unspecified: Secondary | ICD-10-CM | POA: Diagnosis present

## 2023-08-22 DIAGNOSIS — I130 Hypertensive heart and chronic kidney disease with heart failure and stage 1 through stage 4 chronic kidney disease, or unspecified chronic kidney disease: Secondary | ICD-10-CM | POA: Diagnosis present

## 2023-08-22 DIAGNOSIS — D649 Anemia, unspecified: Secondary | ICD-10-CM | POA: Diagnosis not present

## 2023-08-22 DIAGNOSIS — W19XXXS Unspecified fall, sequela: Secondary | ICD-10-CM | POA: Diagnosis not present

## 2023-08-22 DIAGNOSIS — I5032 Chronic diastolic (congestive) heart failure: Secondary | ICD-10-CM | POA: Diagnosis present

## 2023-08-22 DIAGNOSIS — B9629 Other Escherichia coli [E. coli] as the cause of diseases classified elsewhere: Secondary | ICD-10-CM | POA: Diagnosis not present

## 2023-08-22 DIAGNOSIS — I5031 Acute diastolic (congestive) heart failure: Secondary | ICD-10-CM | POA: Diagnosis not present

## 2023-08-22 DIAGNOSIS — D631 Anemia in chronic kidney disease: Secondary | ICD-10-CM | POA: Diagnosis present

## 2023-08-22 DIAGNOSIS — E876 Hypokalemia: Secondary | ICD-10-CM | POA: Diagnosis present

## 2023-08-22 DIAGNOSIS — Z79890 Hormone replacement therapy (postmenopausal): Secondary | ICD-10-CM | POA: Diagnosis not present

## 2023-08-22 DIAGNOSIS — R195 Other fecal abnormalities: Secondary | ICD-10-CM | POA: Diagnosis not present

## 2023-08-22 DIAGNOSIS — N179 Acute kidney failure, unspecified: Secondary | ICD-10-CM | POA: Diagnosis present

## 2023-08-22 DIAGNOSIS — M21371 Foot drop, right foot: Secondary | ICD-10-CM | POA: Diagnosis present

## 2023-08-22 DIAGNOSIS — Z1612 Extended spectrum beta lactamase (ESBL) resistance: Secondary | ICD-10-CM | POA: Diagnosis present

## 2023-08-22 DIAGNOSIS — Z993 Dependence on wheelchair: Secondary | ICD-10-CM | POA: Diagnosis not present

## 2023-08-22 DIAGNOSIS — W1830XA Fall on same level, unspecified, initial encounter: Secondary | ICD-10-CM | POA: Diagnosis present

## 2023-08-22 DIAGNOSIS — Z6838 Body mass index (BMI) 38.0-38.9, adult: Secondary | ICD-10-CM | POA: Diagnosis not present

## 2023-08-22 DIAGNOSIS — W19XXXA Unspecified fall, initial encounter: Secondary | ICD-10-CM | POA: Diagnosis not present

## 2023-08-22 DIAGNOSIS — B961 Klebsiella pneumoniae [K. pneumoniae] as the cause of diseases classified elsewhere: Secondary | ICD-10-CM | POA: Diagnosis present

## 2023-08-22 DIAGNOSIS — Y92009 Unspecified place in unspecified non-institutional (private) residence as the place of occurrence of the external cause: Secondary | ICD-10-CM | POA: Diagnosis not present

## 2023-08-22 DIAGNOSIS — Z79899 Other long term (current) drug therapy: Secondary | ICD-10-CM | POA: Diagnosis not present

## 2023-08-22 DIAGNOSIS — K7460 Unspecified cirrhosis of liver: Secondary | ICD-10-CM | POA: Diagnosis present

## 2023-08-22 DIAGNOSIS — I4821 Permanent atrial fibrillation: Secondary | ICD-10-CM | POA: Diagnosis present

## 2023-08-22 DIAGNOSIS — N3000 Acute cystitis without hematuria: Secondary | ICD-10-CM | POA: Diagnosis present

## 2023-08-22 DIAGNOSIS — G459 Transient cerebral ischemic attack, unspecified: Secondary | ICD-10-CM | POA: Diagnosis not present

## 2023-08-22 DIAGNOSIS — M109 Gout, unspecified: Secondary | ICD-10-CM | POA: Diagnosis present

## 2023-08-22 DIAGNOSIS — K3189 Other diseases of stomach and duodenum: Secondary | ICD-10-CM | POA: Diagnosis not present

## 2023-08-22 DIAGNOSIS — R296 Repeated falls: Secondary | ICD-10-CM | POA: Diagnosis not present

## 2023-08-22 DIAGNOSIS — E785 Hyperlipidemia, unspecified: Secondary | ICD-10-CM | POA: Diagnosis present

## 2023-08-22 DIAGNOSIS — Z66 Do not resuscitate: Secondary | ICD-10-CM | POA: Diagnosis present

## 2023-08-22 DIAGNOSIS — I272 Pulmonary hypertension, unspecified: Secondary | ICD-10-CM | POA: Diagnosis present

## 2023-08-22 DIAGNOSIS — D509 Iron deficiency anemia, unspecified: Secondary | ICD-10-CM | POA: Diagnosis present

## 2023-08-22 DIAGNOSIS — N139 Obstructive and reflux uropathy, unspecified: Secondary | ICD-10-CM | POA: Diagnosis present

## 2023-08-22 LAB — BASIC METABOLIC PANEL
Anion gap: 9 (ref 5–15)
BUN: 35 mg/dL — ABNORMAL HIGH (ref 8–23)
CO2: 27 mmol/L (ref 22–32)
Calcium: 9 mg/dL (ref 8.9–10.3)
Chloride: 105 mmol/L (ref 98–111)
Creatinine, Ser: 1.95 mg/dL — ABNORMAL HIGH (ref 0.44–1.00)
GFR, Estimated: 26 mL/min — ABNORMAL LOW (ref >60)
Glucose, Bld: 88 mg/dL (ref 70–99)
Potassium: 3.6 mmol/L (ref 3.5–5.1)
Sodium: 141 mmol/L (ref 135–145)

## 2023-08-22 LAB — CBC
HCT: 24.7 % — ABNORMAL LOW (ref 36.0–46.0)
Hemoglobin: 7.4 g/dL — ABNORMAL LOW (ref 12.0–15.0)
MCH: 30.5 pg (ref 26.0–34.0)
MCHC: 30 g/dL (ref 30.0–36.0)
MCV: 101.6 fL — ABNORMAL HIGH (ref 80.0–100.0)
Platelets: 138 10*3/uL — ABNORMAL LOW (ref 150–400)
RBC: 2.43 MIL/uL — ABNORMAL LOW (ref 3.87–5.11)
RDW: 14.6 % (ref 11.5–15.5)
WBC: 4.5 10*3/uL (ref 4.0–10.5)
nRBC: 0 % (ref 0.0–0.2)

## 2023-08-22 MED ORDER — METOPROLOL TARTRATE 5 MG/5ML IV SOLN: 5.0000 mg | Freq: Three times a day (TID) | INTRAVENOUS | Status: DC | PRN

## 2023-08-22 MED ORDER — MIDODRINE HCL 5 MG PO TABS
5.0000 mg | ORAL_TABLET | Freq: Three times a day (TID) | ORAL | Status: DC
  Administered 2023-08-22 (×2): 5 mg via ORAL
  Filled 2023-08-22 (×3): qty 1

## 2023-08-22 MED ORDER — EZETIMIBE 10 MG PO TABS
10.0000 mg | ORAL_TABLET | Freq: Every day | ORAL | Status: DC
  Administered 2023-08-22 – 2023-08-28 (×7): 10 mg via ORAL
  Filled 2023-08-22 (×7): qty 1

## 2023-08-22 MED ORDER — SODIUM CHLORIDE 0.9 % IV SOLN
1.0000 g | INTRAVENOUS | Status: AC
  Administered 2023-08-22 – 2023-08-24 (×3): 1 g via INTRAVENOUS
  Filled 2023-08-22 (×3): qty 10

## 2023-08-22 MED ORDER — LACTATED RINGERS IV SOLN: INTRAVENOUS | Status: AC

## 2023-08-22 NOTE — Plan of Care (Signed)

## 2023-08-22 NOTE — Progress Notes (Signed)
-  Pharmacy informing him that micro lab called as patient has 1/3 blood cultures in the aerobic bottle gram-positive blood.  Per chart review patient does not have any sign of infection at this time.  No need for antibiotic at this time.[name]   Tereasa Coop, MD [facility] 08/22/2023, 1:02 AM

## 2023-08-22 NOTE — Discharge Instructions (Signed)

## 2023-08-22 NOTE — Progress Notes (Signed)
PHARMACY - PHYSICIAN COMMUNICATION CRITICAL VALUE ALERT - BLOOD CULTURE IDENTIFICATION (BCID)  Katelyn Jackson is an 77 y.o. female who presented to [facility] on 08/20/2023 with ground-level fall and stroke-like symptoms  Assessment: 1/3 Bcx in the aerobic bottle growing gram positive rods on gram stain.  WBC 7.7 > 6.9, lactate 0.9, afebrile  Name of physician (or Provider) Contacted: Dr. Janalyn Shy   Current antibiotics: None  Changes to prescribed antibiotics recommended:   -Possible contaminate, no antibiotics indicated at this time   Arabella Merles, PharmD. Clinical Pharmacist 08/22/2023 12:41 AM

## 2023-08-22 NOTE — TOC Initial Note (Signed)
Transition of Care [facility]) - Initial/Assessment Note    Patient Details  Name: Katelyn Jackson MRN: [id number] Date of Birth: 04/06/1946  Transition of Care [facility]) CM/SW Contact:    Mearl Latin, LCSW Phone Number: 08/22/2023, 1:03 PM  Clinical Narrative:                 CSW received consult for possible SNF placement at time of discharge. CSW spoke with patient's son, Jeb Levering, to confirm plan as patient reported wanting to go home and not SNF. [name] shared that patient was not home very long from discharging from [facility] before she fell and had to come back to the hospital. Insurance cut patient off at [facility] and Jeb Levering stated he lost the appeal. CSW explained that insurance approval could be requested again and patient will be in copay days but if it is denied the entire stay would be out of pocket. He asked about CIR and CSW explained that patient has not been recommended for CIR as she is likely unable to tolerate that intense therapy. He reported understanding and will speak with the patient today and let CSW know their decision.   Skilled Nursing Rehab Facilities-   [email or web address]   Ratings out of 5 stars (5 the highest)   Name Address  Phone # Quality Care Staffing Health Inspection Overall  [facility] [address] [place] [phone] 2 2 5 5   [facility] [address] [place] [phone] 3 2 4 4   [facility] [address], [place]in[place] Otto [phone] 2 2 2 2   [facility] [address], [place] [phone] 4 2 4 4   [facility]  [address], [place] [phone] 3 3 5 5   [facility] [address] [facility] [phone] 1 2 2 1   [facility] [address] [place] [phone] 4 1 2 1   [facility] [address] [place] [phone] 1 3 3 2   [address] ([facility]) [address] [place] [phone] 2 2 2 2   [facility] [address]  [place], [place] [phone] 2 2 1 1   [facility] [address], [place] [phone] 4 1 1 1   [name] [address] Liliane Shi [phone] 4 3 4 4   [facility] [address] [place] [phone] 3 4 3 3           [facility] [address], [place] [phone] 4 2 1 1   [facility], [place] [address], [place] [phone] 1 1 2 1   [facility] [address] [place] [phone] 2 1 4 3   [facility]  [address] [phone] 2 1 4 3   [facility] [address], [place] [phone] 2 3 3 3           [address] (no [facility]) [address], [place] [phone] 4 5 5 5   [facility] (No [employer]) [address], [place] [phone] 1 2 4 3   [facility] [address] [place] [phone] 2 1 2 1   [facility] (No [employer]) [address], [place] [phone] 4 5 5 5   [facility] [address], [place] [phone] 3 3 2 2   [facility] [address], [place] [phone] 2 1 1 1   [facility] [address] Liliane Shi [phone] 5 2 5 5   [facility]  [address], [place] [phone] 2 1 2 1   [facility] [address] [place]  [phone] 4 1 1 1   [facility] [address] [place], [place] [phone] 2 2 3 3           [facility] [address] [place] [phone] 1 1 1 1   [facility] [address] Evlyn Clines  [phone] 2 4 3 3   [facility] (No Humana) [address] [place] [phone] 2 2 4 4   [facility]) [address], [place]al[place] Levan [phone] 2 1 1 1   [facility] [address], [place] Levan [phone] 4 3 5 5   [facility] [address], [place] [phone] 1 1 1 1           [facility] [address] [place], [place] [phone] 5 4 5 5   [facility])  [address], [place] [phone] 1 1  2 1   [facility]) [address], [place] [phone]  2 4 4   [facility] [address] [place] [phone] 3 5 5 5   [address] [place], [place] [phone] 4 2 2 2   [facility]) [address] [place] [phone] 3 1 3 2      Expected Discharge Plan: Skilled Nursing Facility Barriers to Discharge: Continued Medical Work up, Insurance Authorization   Patient Goals and CMS Choice Patient states their goals for this hospitalization and ongoing recovery are:: Rehab CMS Medicare.gov Compare Post Acute Care list provided to:: Patient Represenative (must comment) Choice offered to / list presented to : Adult Children [place] ownership interest in [facility].provided to:: Adult Children    Expected Discharge Plan and Services In-house Referral: Clinical Social Work     Living arrangements for the past 2 months: Single Family Home, Skilled Nursing Facility                                      Prior Living Arrangements/Services Living arrangements for the past 2 months: Single Family Home, Skilled Nursing Facility Lives with:: Adult Children Patient language and need for interpreter reviewed:: Yes (Speaks [place]) Do you feel safe going back to the place where you live?: Yes      Need for Family Participation in Patient Care: Yes (Comment) Care giver support system in place?: Yes (comment)   Criminal Activity/Legal Involvement Pertinent to Current Situation/Hospitalization: No - Comment as needed  Activities of Daily Living   ADL Screening (condition at time of admission) Independently performs ADLs?: No Does the patient have a NEW difficulty with bathing/dressing/toileting/self-feeding that is expected to last >3 days?: Yes (Initiates electronic notice to provider for possible OT consult) Does the patient have a NEW difficulty with getting in/out of bed, walking, or climbing stairs  that is expected to last >3 days?: Yes (Initiates electronic notice to provider for possible PT consult) Does the patient have a NEW difficulty with communication that is expected to last >3 days?: No Is the patient deaf or have difficulty hearing?: No Does the patient have difficulty seeing, even when wearing glasses/contacts?: No Does the patient have difficulty concentrating, remembering, or making decisions?: No  Permission Sought/Granted Permission sought to share information with : Facility Medical sales representative, Family Supports Permission granted to share information with : Yes, Verbal Permission Granted  Share Information with NAME: [name]  Permission granted to share info w AGENCY: [name]  Permission granted to share info w Relationship: Son  Permission granted  to share info w Contact Information: [phone]  Emotional Assessment Appearance:: Appears stated age Attitude/Demeanor/Rapport: Engaged Affect (typically observed): Accepting Orientation: : Oriented to Self, Oriented to Place, Oriented to  Time, Oriented to Situation Alcohol / Substance Use: Not Applicable Psych Involvement: No (comment)  Admission diagnosis:  Myalgia [M79.10] Acute cystitis without hematuria [N30.00] AKI (acute kidney injury) (HCC) [N17.9] Stroke-like symptoms [R29.90] Fall, initial encounter [W19.XXXA] Patient Active Problem List   Diagnosis Date Noted   Stroke-like symptoms 08/20/2023   Class 2 obesity 06/12/2023   Dehydration 06/10/2023   Constipation 06/09/2023   Foley catheter in place prior to arrival 06/09/2023   Increased endometrial stripe thickness 06/09/2023   Closed patellar sleeve fracture of left knee 05/20/2023   Fall 05/18/2023   Patella fracture 05/18/2023   Clavicle fracture 05/18/2023   Abnormal urinalysis 05/18/2023   Chronic kidney disease, stage 3b (HCC) 05/18/2023   History of breast cancer 05/18/2023   Intertrigo 04/17/2023   Skin lesion 03/08/2023    Chronic heart failure with preserved ejection fraction (HCC) 12/16/2022   Callus 11/04/2022   Cardiac pacemaker in situ 12/10/2021   Morbid obesity with BMI of 45.0-49.9, adult (HCC) 12/10/2021   Breast lesion 12/07/2021   Dry skin 09/22/2021   Colonization with VRE (vancomycin-resistant enterococcus) 09/22/2021   Osteoarthritis of right knee 08/19/2021   Herpes labialis 08/19/2021   Liver cirrhosis (HCC) 07/22/2021   Abscess    Prosthetic joint infection (HCC)    Abscess of right shoulder 07/21/2021   History of pacemaker 07/21/2021   Pressure injury of skin 07/03/2021   Hypothyroidism    Cellulitis    Postoperative anemia due to acute blood loss 03/19/2021   Dyspnea 03/19/2021   Symptomatic anemia 03/19/2021   Status post right shoulder hemiarthroplasty 03/17/2021   Bradycardia 01/05/2021   CAP (community acquired pneumonia) 01/05/2021   CKD (chronic kidney disease) stage 4, GFR 15-29 ml/min (HCC) 01/05/2021   Bilateral pain of leg and foot 10/08/2020   Abscess of left thumb 06/17/2020   Cellulitis and abscess of left leg 06/17/2020   Herpes zoster 02/18/2020   GERD (gastroesophageal reflux disease) 11/25/2019   Wheezing 10/26/2017   Sciatica of left side 08/29/2017   Morbid obesity (HCC) 07/11/2017   Hyperglycemia 07/11/2017   Low back pain 06/19/2017   Acute renal failure superimposed on stage 3b chronic kidney disease (HCC) 06/19/2017   Hypokalemia 06/19/2017   Acute lower UTI 06/19/2017   Anemia of chronic disease 02/16/2017   Malignant neoplasm of upper-outer quadrant of left breast in female, estrogen receptor positive (HCC) 02/10/2017   Essential hypertension 04/05/2016   Dyslipidemia 04/05/2016   Chronic atrial fibrillation (HCC) 04/05/2016   Pulmonary hypertension (HCC) 04/05/2016   Lymphedema 04/05/2016   PCP:  Tresa Garter, MD Pharmacy:   [facility] [place], [place] - [address] [place] [address] Phone:  [phone] Fax: [phone]     Social Determinants of Health (SDOH) Social History: SDOH Screenings   Food Insecurity: No Food Insecurity (08/21/2023)  Housing: Patient Unable To Answer (08/21/2023)  Transportation Needs: No Transportation Needs (08/21/2023)  Utilities: Not At Risk (08/21/2023)  Alcohol Screen: Low Risk  (06/15/2022)  Depression (PHQ2-9): Low Risk  (04/17/2023)  Financial Resource Strain: Low Risk  (06/15/2022)  Physical Activity: Inactive (06/15/2022)  Social Connections: Socially Isolated (06/15/2022)  Stress: No Stress Concern Present (06/15/2022)  Tobacco Use: Low Risk  (08/21/2023)   SDOH Interventions:     Readmission Risk Interventions  07/06/2021   11:49 AM 03/19/2021    3:03 PM  Readmission Risk Prevention Plan  Transportation Screening Complete Complete  HRI or Home Care Consult  Complete  Social Work Consult for Recovery Care Planning/Counseling  Complete  Palliative Care Screening  Not Applicable  Medication Review Oceanographer)  Complete  HRI or Home Care Consult Complete   SW Recovery Care/Counseling Consult Complete   Palliative Care Screening Not Applicable   Skilled Nursing Facility Complete

## 2023-08-22 NOTE — Progress Notes (Signed)
Carotid duplex has been completed.   Results can be found under chart review under CV PROC. 08/22/2023 5:47 PM Oddie Kuhlmann RVT, RDMS

## 2023-08-22 NOTE — Progress Notes (Addendum)
PROGRESS NOTE                                                                                                                                                                                                             Patient Demographics:    Katelyn Jackson, is a 77 y.o. female, DOB - 11-20-1945, WUJ:[id number]  Outpatient Primary MD for the patient is Plotnikov, Georgina Quint, MD    LOS - 1  Admit date - 08/20/2023    Chief Complaint  Patient presents with   Fall       Brief Narrative (HPI from H&P)   34 Russian-speaking female with history of permanent A-fib on anticoagulation, status post pacemaker, decompensated liver cirrhosis, CKD stage 2 hypertension, hyperlipidemia, hypothyroidism, chronic lymphedema, history of breast cancer status post radiation therapy, recent trauma with fracture of left upper and lower extremities, wheelchair dependent who was brought from home after a level 2 trauma following ground-level fall.  She lives with her son.  Patient noted right-sided numbness involving her right face, arm and leg with left-sided weakness before fall.  Suspected stroke on admission but MRI of the brain and cervical spine did not show any acute findings.  PT/OT evaluation pending.    Subjective:    Katelyn Jackson today has, No headache, No chest pain, No abdominal pain - No Nausea, No new weakness tingling or numbness, no SOB, right-sided tingling numbness much improved.   Assessment  & Plan :   Fall/strokelike symptoms: Fell at home.  Developed right-sided numbness/weakness - multiple falls over the last 1 mth. Negative MRI of the brain, cervical spine did not show any acute findings but showed mild C5-6 spinal canal stenosis,moderate bilateral C3-4 and C4-5 neural foraminal stenosis.  Her skeletal survey did not show any acute findings.  PT/OT consulted.  Patient has severe weakness and deconditioning at baseline.   She recently has  hospitalization from  07/10 to 05/24/23 for mechanical fall with left patellar and left distal clavicle fracture.  Recently had surgery of the right shoulder.  Cannot move right shoulder, sided range of motion and strength improving, uses wheelchair at home, continue PT OT, may require SNF, no acute stroke identified, already on Eliquis and statin for secondary prevention LDL near goal add Zetia, obtain echocardiogram and carotid artery ultrasound to complete TIA workup in case this  was a mild TIA.  Addendum DW son at 5pm - in bed R leg 4.5/5, but difficulty walking on the R leg for a few days per son, PT eval still pending, MRI brain negative, since multiple falls - will check CT R Hip, if negative MRI L spine in am.    AKI on CKD stage II: Baseline creatinine around 1.1.  Is on diuretics and was hypotensive, hold diuretics, hold blood pressure medications, hydrate with IV fluids, check renal ultrasound and urine electrolytes, blood pressure improving.  Acute on chronic microcytic anemia: Baseline hemoglobin around 9-11.  Hemoglobin dropped to the range of 7.9, likely from hemodilution.  Will check FOBT.  No history of hematochezia, melena. follow-up iron panel.  Monitor hemoglobin   Chronic hypotension: On midodrine therapy.  Continue gentle IV fluids today.   UTI.  Rocephin x 3 days.     History of A-fib on anticoagulation/pacemaker placement: Currently on Eliquis, blood pressures improving low-dose Lopressor as needed   History of cirrhosis: On lactulose, rifaximin at home.  Continue. Mentation at baseline.   Hyperlipidemia: On statin at home   Gout: On allopurinol.       Condition - Fair  Family Communication  : DW son 08/22/23  Code Status :  DNR  Consults  :    PUD Prophylaxis : PPI   Procedures  :     MRI brain nonacute  MRI C-spine. Moderate bilateral C3-4 and C4-5 neural foraminal stenosis  4 view x-ray of bilateral knees.  Chronic moderate to severe  osteoarthritis      Disposition Plan  :    Status is: Observation  DVT Prophylaxis  :     apixaban (ELIQUIS) tablet 5 mg    Lab Results  Component Value Date   PLT 138 (L) 08/22/2023    Diet :  Diet Order             Diet regular Room service appropriate? Yes; Fluid consistency: Thin  Diet effective now                    Inpatient Medications  Scheduled Meds:  allopurinol  50 mg Oral Daily   apixaban  5 mg Oral BID   lactulose  10 g Oral TID   levothyroxine  50 mcg Oral Q0600   midodrine  5 mg Oral TID WC   pantoprazole  40 mg Oral Daily   pravastatin  20 mg Oral q1800   rifaximin  550 mg Oral BID   Continuous Infusions:  cefTRIAXone (ROCEPHIN)  IV Stopped (08/22/23 0626)   lactated ringers     PRN Meds:.acetaminophen, oxyCODONE, polyethylene glycol    Objective:   Vitals:   08/21/23 1930 08/22/23 0000 08/22/23 0500 08/22/23 0803  BP: (!) 120/52 (!) 110/47  (!) 122/53  Pulse: 65 63  65  Resp: 17 16  20   Temp: 97.9 F (36.6 C)   98.8 F (37.1 C)  TempSrc: Oral   Oral  SpO2: 95% 91%  94%  Weight:   91.8 kg   Height:        Wt Readings from Last 3 Encounters:  08/22/23 91.8 kg  06/09/23 92 kg  05/18/23 92 kg     Intake/Output Summary (Last 24 hours) at 08/22/2023 0823 Last data filed at 08/22/2023 0631 Gross per 24 hour  Intake 100 ml  Output 400 ml  Net -300 ml     Physical Exam  Awake Alert, No new F.N deficits, chronic  frozen light shoulder with limited range of motion but strength is 4/5 and at baseline, other 3 extremities 5/5 with good range of motion [place].AT,PERRAL Supple Neck, No JVD,   Symmetrical Chest wall movement, Good air movement bilaterally, CTAB RRR,No Gallops,Rubs or new Murmurs,  +ve B.Sounds, Abd Soft, No tenderness,   No Cyanosis, Clubbing or edema     RN pressure injury documentation: Pressure Injury 07/03/21 Thigh Posterior;Left;Upper Stage 3 -  Full thickness tissue loss. Subcutaneous fat may be visible  but bone, tendon or muscle are NOT exposed. (Active)  07/03/21 0453  Location: Thigh  Location Orientation: Posterior;Left;Upper  Staging: Stage 3 -  Full thickness tissue loss. Subcutaneous fat may be visible but bone, tendon or muscle are NOT exposed.  Wound Description (Comments):   Present on Admission: Yes     Pressure Injury 07/03/21 Sacrum Stage 2 -  Partial thickness loss of dermis presenting as a shallow open injury with a red, pink wound bed without slough. sacrum/inner gluteal fold (Active)  07/03/21 2105  Location: Sacrum  Location Orientation:   Staging: Stage 2 -  Partial thickness loss of dermis presenting as a shallow open injury with a red, pink wound bed without slough.  Wound Description (Comments): sacrum/inner gluteal fold  Present on Admission: Yes     Pressure Injury 06/13/23 Buttocks (Active)  06/13/23 0500  Location: Buttocks  Location Orientation:   Staging:   Wound Description (Comments):   Present on Admission: No      Data Review:    Recent Labs  Lab 08/20/23 2045 08/21/23 0028 08/21/23 0237 08/22/23 0257  WBC 7.7 6.9  --  4.5  HGB 9.3* 7.7* 7.9* 7.4*  HCT 30.2* 25.5* 26.3* 24.7*  PLT 143* 140*  --  138*  MCV 98.7 100.4*  --  101.6*  MCH 30.4 30.3  --  30.5  MCHC 30.8 30.2  --  30.0  RDW 14.5 14.4  --  14.6  LYMPHSABS 1.1  --   --   --   MONOABS 0.8  --   --   --   EOSABS 0.1  --   --   --   BASOSABS 0.0  --   --   --     Recent Labs  Lab 08/20/23 2045 08/20/23 2055 08/21/23 0028 08/21/23 0034 08/21/23 0237 08/21/23 0821 08/22/23 0257  NA 141  --  143  --  141  --  141  K 3.5  --  3.4*  --  3.5  --  3.6  CL 101  --  104  --  104  --  105  CO2 27  --  26  --  27  --  27  ANIONGAP 13  --  13  --  10  --  9  GLUCOSE 117*  --  106*  --  98  --  88  BUN 44*  --  41*  --  41*  --  35*  CREATININE 2.04*  --  2.00*  --  1.89*  --  1.95*  AST 17  --   --   --   --   --   --   ALT 12  --   --   --   --   --   --   ALKPHOS 129*  --    --   --   --   --   --   BILITOT 0.9  --   --   --   --   --   --  ALBUMIN 3.2*  --   --   --   --   --   --   LATICACIDVEN  --  0.9  --  0.9  --   --   --   INR 1.4*  --   --   --   --   --   --   HGBA1C  --   --   --   --   --  4.8  --   MG  --   --  2.7*  --   --   --   --   CALCIUM 9.0  --  9.2  --  9.2  --  9.0      Recent Labs  Lab 08/20/23 2045 08/20/23 2055 08/21/23 0028 08/21/23 0034 08/21/23 0237 08/21/23 0821 08/22/23 0257  LATICACIDVEN  --  0.9  --  0.9  --   --   --   INR 1.4*  --   --   --   --   --   --   HGBA1C  --   --   --   --   --  4.8  --   MG  --   --  2.7*  --   --   --   --   CALCIUM 9.0  --  9.2  --  9.2  --  9.0    --------------------------------------------------------------------------------------------------------------- Lab Results  Component Value Date   CHOL 151 08/21/2023   HDL 63 08/21/2023   LDLCALC 75 08/21/2023   TRIG 67 08/21/2023   CHOLHDL 2.4 08/21/2023    Lab Results  Component Value Date   HGBA1C 4.8 08/21/2023   No results for input(s): "TSH", "T4TOTAL", "FREET4", "T3FREE", "THYROIDAB" in the last 72 hours. Recent Labs    08/21/23 0237 08/21/23 0835  VITAMINB12 327  --   FOLATE  --  19.2  FERRITIN 28  --   TIBC 370  --   IRON 45  --    ------------------------------------------------------------------------------------------------------------------ Cardiac Enzymes No results for input(s): "CKMB", "TROPONINI", "MYOGLOBIN" in the last 168 hours.  Invalid input(s): "CK"  Micro Results Recent Results (from the past 240 hour(s))  Blood Culture (routine x 2)     Status: None (Preliminary result)   Collection Time: 08/20/23  8:45 PM   Specimen: BLOOD  Result Value Ref Range Status   Specimen Description BLOOD RIGHT ANTECUBITAL  Final   Special Requests   Final    BOTTLES DRAWN AEROBIC AND ANAEROBIC Blood Culture results may not be optimal due to an excessive volume of blood received in culture bottles   Culture    Final    NO GROWTH < 12 HOURS Performed at [facility], [address], [place], [place] [address]    Report Status PENDING  Incomplete  Blood Culture (routine x 2)     Status: None (Preliminary result)   Collection Time: 08/20/23  9:21 PM   Specimen: BLOOD  Result Value Ref Range Status   Specimen Description BLOOD LEFT ANTECUBITAL  Final   Special Requests   Final    BOTTLES DRAWN AEROBIC ONLY Blood Culture results may not be optimal due to an inadequate volume of blood received in culture bottles   Culture  Setup Time   Final    GRAM POSITIVE RODS AEROBIC BOTTLE ONLY CRITICAL RESULT CALLED TO, READ BACK BY AND VERIFIED WITH: [facility]  08/22/23 MK Performed at [facility], [address], [place], [place] [address]  Culture GRAM POSITIVE RODS  Final   Report Status PENDING  Incomplete    Radiology Reports MR Cervical Spine Wo Contrast  Result Date: 08/21/2023 CLINICAL DATA:  Trauma EXAM: MRI CERVICAL SPINE WITHOUT CONTRAST TECHNIQUE: Multiplanar, multisequence MR imaging of the cervical spine was performed. No intravenous contrast was administered. COMPARISON:  None Available. FINDINGS: Alignment: Grade 1 anterolisthesis at C2-3 and grade 1 retrolisthesis at C4-5. Vertebrae: No fracture, evidence of discitis, or bone lesion. Cord: Normal signal and morphology. Posterior Fossa, vertebral arteries, paraspinal tissues: Negative. Disc levels: C1-2: Unremarkable. C2-3: Normal disc space and facet joints. There is no spinal canal stenosis. No neural foraminal stenosis. C3-4: Small disc bulge with bilateral uncovertebral hypertrophy. There is no spinal canal stenosis. Moderate bilateral neural foraminal stenosis. C4-5: Small disc bulge and bilateral uncovertebral hypertrophy. There is no spinal canal stenosis. Moderate bilateral neural foraminal stenosis. C5-6: Small disc bulge with mild uncovertebral hypertrophy. Mild spinal canal stenosis. Mild bilateral neural  foraminal stenosis. C6-7: Small disc bulge with uncovertebral hypertrophy. There is no spinal canal stenosis. No neural foraminal stenosis. C7-T1: Normal disc space and facet joints. There is no spinal canal stenosis. No neural foraminal stenosis. IMPRESSION: 1. No acute abnormality of the cervical spine. 2. Mild C5-6 spinal canal stenosis. 3. Moderate bilateral C3-4 and C4-5 neural foraminal stenosis. Electronically Signed   By: Deatra Robinson M.D.   On: 08/21/2023 02:55   MR BRAIN WO CONTRAST  Result Date: 08/21/2023 CLINICAL DATA:  Acute neurologic deficits EXAM: MRI HEAD WITHOUT CONTRAST TECHNIQUE: Multiplanar, multiecho pulse sequences of the brain and surrounding structures were obtained without intravenous contrast. COMPARISON:  None Available. FINDINGS: Brain: No acute infarct, mass effect or extra-axial collection. No acute or chronic hemorrhage. There is multifocal hyperintense T2-weighted signal within the white matter. Parenchymal volume and CSF spaces are normal. The midline structures are normal. Vascular: Normal flow voids. Skull and upper cervical spine: Grade 1 anterolisthesis at C2-3. Normal calvarium. Sinuses/Orbits:No paranasal sinus fluid levels or advanced mucosal thickening. No mastoid or middle ear effusion. Normal orbits. Ocular lens replacements. IMPRESSION: 1. No acute intracranial abnormality. 2. Findings of chronic small vessel ischemia. Electronically Signed   By: Deatra Robinson M.D.   On: 08/21/2023 02:30   CT Head Wo Contrast  Result Date: 08/20/2023 CLINICAL DATA:  Trauma EXAM: CT HEAD WITHOUT CONTRAST CT CERVICAL SPINE WITHOUT CONTRAST TECHNIQUE: Multidetector CT imaging of the head and cervical spine was performed following the standard protocol without intravenous contrast. Multiplanar CT image reconstructions of the cervical spine were also generated. RADIATION DOSE REDUCTION: This exam was performed according to the departmental dose-optimization program which includes  automated exposure control, adjustment of the mA and/or kV according to patient size and/or use of iterative reconstruction technique. COMPARISON:  None Available. FINDINGS: CT HEAD FINDINGS Brain: There is no mass, hemorrhage or extra-axial collection. The size and configuration of the ventricles and extra-axial CSF spaces are normal. There is hypoattenuation of the periventricular white matter, most commonly indicating chronic ischemic microangiopathy. Vascular: No abnormal hyperdensity of the major intracranial arteries or dural venous sinuses. No intracranial atherosclerosis. Skull: The visualized skull base, calvarium and extracranial soft tissues are normal. Sinuses/Orbits: No fluid levels or advanced mucosal thickening of the visualized paranasal sinuses. No mastoid or middle ear effusion. The orbits are normal. CT CERVICAL SPINE FINDINGS Alignment: Anterolisthesis at C2-3.  Grade 1 retrolisthesis at C4-5. Skull base and vertebrae: No acute fracture. Soft tissues and spinal canal: No prevertebral fluid or swelling. No visible canal hematoma.  Disc levels: Multilevel degenerative disc disease. No high-grade spinal canal stenosis. Multilevel neural foraminal narrowing, greatest at C4-5. Upper chest: No pneumothorax, pulmonary nodule or pleural effusion. Other: Calcific atherosclerosis of the left greater than right carotid system. IMPRESSION: 1. No acute intracranial abnormality. 2. No acute fracture or static subluxation of the cervical spine. 3. Multilevel degenerative disc disease. Electronically Signed   By: Deatra Robinson M.D.   On: 08/20/2023 22:01   CT Cervical Spine Wo Contrast  Result Date: 08/20/2023 CLINICAL DATA:  Trauma EXAM: CT HEAD WITHOUT CONTRAST CT CERVICAL SPINE WITHOUT CONTRAST TECHNIQUE: Multidetector CT imaging of the head and cervical spine was performed following the standard protocol without intravenous contrast. Multiplanar CT image reconstructions of the cervical spine were also  generated. RADIATION DOSE REDUCTION: This exam was performed according to the departmental dose-optimization program which includes automated exposure control, adjustment of the mA and/or kV according to patient size and/or use of iterative reconstruction technique. COMPARISON:  None Available. FINDINGS: CT HEAD FINDINGS Brain: There is no mass, hemorrhage or extra-axial collection. The size and configuration of the ventricles and extra-axial CSF spaces are normal. There is hypoattenuation of the periventricular white matter, most commonly indicating chronic ischemic microangiopathy. Vascular: No abnormal hyperdensity of the major intracranial arteries or dural venous sinuses. No intracranial atherosclerosis. Skull: The visualized skull base, calvarium and extracranial soft tissues are normal. Sinuses/Orbits: No fluid levels or advanced mucosal thickening of the visualized paranasal sinuses. No mastoid or middle ear effusion. The orbits are normal. CT CERVICAL SPINE FINDINGS Alignment: Anterolisthesis at C2-3.  Grade 1 retrolisthesis at C4-5. Skull base and vertebrae: No acute fracture. Soft tissues and spinal canal: No prevertebral fluid or swelling. No visible canal hematoma. Disc levels: Multilevel degenerative disc disease. No high-grade spinal canal stenosis. Multilevel neural foraminal narrowing, greatest at C4-5. Upper chest: No pneumothorax, pulmonary nodule or pleural effusion. Other: Calcific atherosclerosis of the left greater than right carotid system. IMPRESSION: 1. No acute intracranial abnormality. 2. No acute fracture or static subluxation of the cervical spine. 3. Multilevel degenerative disc disease. Electronically Signed   By: Deatra Robinson M.D.   On: 08/20/2023 22:01   DG Pelvis Portable  Result Date: 08/20/2023 CLINICAL DATA:  Fall EXAM: PORTABLE PELVIS 1-2 VIEWS COMPARISON:  None Available. FINDINGS: There is no evidence of displaced pelvic fracture or diastasis. No pelvic bone lesions are  seen. IMPRESSION: No displaced pelvic fracture or dislocation. Electronically Signed   By: Jearld Lesch M.D.   On: 08/20/2023 21:43   DG Shoulder Left Portable  Result Date: 08/20/2023 CLINICAL DATA:  Questionable sepsis EXAM: LEFT SHOULDER COMPARISON:  05/23/2023 FINDINGS: No fracture or dislocation of the left shoulder. Mild acromioclavicular and glenohumeral arthrosis with high riding position of the humerus, suggesting chronic rotator cuff tear. IMPRESSION: 1. No fracture or dislocation of the left shoulder. 2. Mild acromioclavicular and glenohumeral arthrosis with high riding position of the humerus, suggesting chronic rotator cuff tear. Electronically Signed   By: Jearld Lesch M.D.   On: 08/20/2023 21:36   DG Knee Complete 4 Views Right  Result Date: 08/20/2023 CLINICAL DATA:  Questionable sepsis EXAM: RIGHT KNEE - COMPLETE 4+ VIEW; LEFT KNEE - COMPLETE 4+ VIEW COMPARISON:  None Available. FINDINGS: No fracture or dislocation of the bilateral knees. Severe, bone-on-bone tricompartmental arthrosis, worst in the patellofemoral compartments. No knee joint effusions. Soft tissues unremarkable. IMPRESSION: 1. No fracture or dislocation of the bilateral knees. 2. Severe, bone-on-bone tricompartmental arthrosis, worst in the patellofemoral compartments. 3. No knee  joint effusions. Electronically Signed   By: Jearld Lesch M.D.   On: 08/20/2023 21:34   DG Knee Complete 4 Views Left  Result Date: 08/20/2023 CLINICAL DATA:  Questionable sepsis EXAM: RIGHT KNEE - COMPLETE 4+ VIEW; LEFT KNEE - COMPLETE 4+ VIEW COMPARISON:  None Available. FINDINGS: No fracture or dislocation of the bilateral knees. Severe, bone-on-bone tricompartmental arthrosis, worst in the patellofemoral compartments. No knee joint effusions. Soft tissues unremarkable. IMPRESSION: 1. No fracture or dislocation of the bilateral knees. 2. Severe, bone-on-bone tricompartmental arthrosis, worst in the patellofemoral compartments. 3. No  knee joint effusions. Electronically Signed   By: Jearld Lesch M.D.   On: 08/20/2023 21:34   DG Chest Port 1 View  Result Date: 08/20/2023 CLINICAL DATA:  Questionable sepsis EXAM: PORTABLE CHEST 1 VIEW COMPARISON:  05/17/2023 FINDINGS: Gross cardiomegaly. Left chest multi lead pacer. Mild diffuse interstitial opacity. No acute osseous findings. Unchanged, chronic, resorbed fractures of the proximal right humerus. IMPRESSION: Gross cardiomegaly with mild diffuse interstitial opacity, consistent with edema or atypical/viral infection. No focal airspace opacity. Electronically Signed   By: Jearld Lesch M.D.   On: 08/20/2023 21:30      Signature  -   Susa Raring M.D on 08/22/2023 at 8:23 AM   -  To page go to [email or web address]

## 2023-08-22 NOTE — Progress Notes (Addendum)
  Nurse notifying-telemetry notified that patient pacer is under sensing and not capturing. -At the present patient has been evaluated.  She denies any chest pain, chest pressure or nausea.  At bedside found out that patient may have been 65.  Heart rate 60.  Bedside cardiac monitoring showing normal sinus rhythm. -Continue to monitor for now. - Due to soft blood pressure requesting nurse to give the midodrine 5 mg now.   Tereasa Coop, MD [facility] 08/22/2023, 3:57 AM

## 2023-08-22 NOTE — Telephone Encounter (Signed)
LDVM for Archie Patten with [facility] for verbals:    Nursing 2 x week 1 1 x week 3   Home Health  1 x week 4

## 2023-08-22 NOTE — Progress Notes (Signed)
PT Cancellation Note  Patient Details Name: Katelyn Jackson MRN: [id number] DOB: June 01, 1946   Cancelled Treatment:    Reason Eval/Treat Not Completed: (P) Patient at procedure or test/unavailable (Attempted 1516 however pt having carotid US in room. Reattempt 1848, per RN transport on their way soon to take her for RLE imaging.) Will continue efforts next date per PT plan of care as schedule permits.    Dorathy Kinsman [place]atchley 08/22/2023, 6:55 PM

## 2023-08-23 ENCOUNTER — Inpatient Hospital Stay (HOSPITAL_COMMUNITY): Payer: Medicare HMO

## 2023-08-23 DIAGNOSIS — N3000 Acute cystitis without hematuria: Secondary | ICD-10-CM | POA: Diagnosis not present

## 2023-08-23 DIAGNOSIS — I5031 Acute diastolic (congestive) heart failure: Secondary | ICD-10-CM

## 2023-08-23 DIAGNOSIS — W19XXXA Unspecified fall, initial encounter: Secondary | ICD-10-CM

## 2023-08-23 DIAGNOSIS — N179 Acute kidney failure, unspecified: Secondary | ICD-10-CM

## 2023-08-23 DIAGNOSIS — R2990 Unspecified symptoms and signs involving the nervous system: Secondary | ICD-10-CM | POA: Diagnosis not present

## 2023-08-23 LAB — BASIC METABOLIC PANEL
Anion gap: 9 (ref 5–15)
BUN: 27 mg/dL — ABNORMAL HIGH (ref 8–23)
CO2: 26 mmol/L (ref 22–32)
Calcium: 9.5 mg/dL (ref 8.9–10.3)
Chloride: 104 mmol/L (ref 98–111)
Creatinine, Ser: 1.47 mg/dL — ABNORMAL HIGH (ref 0.44–1.00)
GFR, Estimated: 37 mL/min — ABNORMAL LOW (ref >60)
Glucose, Bld: 82 mg/dL (ref 70–99)
Potassium: 3.5 mmol/L (ref 3.5–5.1)
Sodium: 139 mmol/L (ref 135–145)

## 2023-08-23 LAB — CBC WITH DIFFERENTIAL/PLATELET
Abs Immature Granulocytes: 0.01 10*3/uL (ref 0.00–0.07)
Basophils Absolute: 0 10*3/uL (ref 0.0–0.1)
Basophils Relative: 1 %
Eosinophils Absolute: 0.3 10*3/uL (ref 0.0–0.5)
Eosinophils Relative: 6 %
HCT: 25.7 % — ABNORMAL LOW (ref 36.0–46.0)
Hemoglobin: 7.7 g/dL — ABNORMAL LOW (ref 12.0–15.0)
Immature Granulocytes: 0 %
Lymphocytes Relative: 28 %
Lymphs Abs: 1.2 10*3/uL (ref 0.7–4.0)
MCH: 29.7 pg (ref 26.0–34.0)
MCHC: 30 g/dL (ref 30.0–36.0)
MCV: 99.2 fL (ref 80.0–100.0)
Monocytes Absolute: 0.7 10*3/uL (ref 0.1–1.0)
Monocytes Relative: 16 %
Neutro Abs: 2 10*3/uL (ref 1.7–7.7)
Neutrophils Relative %: 49 %
Platelets: 143 10*3/uL — ABNORMAL LOW (ref 150–400)
RBC: 2.59 MIL/uL — ABNORMAL LOW (ref 3.87–5.11)
RDW: 14.2 % (ref 11.5–15.5)
WBC: 4.1 10*3/uL (ref 4.0–10.5)
nRBC: 0 % (ref 0.0–0.2)

## 2023-08-23 LAB — TSH: TSH: 1.598 u[IU]/mL (ref 0.350–4.500)

## 2023-08-23 LAB — ECHOCARDIOGRAM COMPLETE
AR max vel: 1.67 cm2
AV Area VTI: 1.57 cm2
AV Area mean vel: 1.54 cm2
AV Mean grad: 7 mm[Hg]
AV Peak grad: 13.5 mm[Hg]
Ao pk vel: 1.84 m/s
Area-P 1/2: 4.33 cm2
Height: 61 in
S' Lateral: 4.4 cm
Weight: 3238.12 [oz_av]

## 2023-08-23 LAB — VITAMIN B12: Vitamin B-12: 536 pg/mL (ref 180–914)

## 2023-08-23 LAB — MAGNESIUM: Magnesium: 2.5 mg/dL — ABNORMAL HIGH (ref 1.7–2.4)

## 2023-08-23 LAB — PHOSPHORUS: Phosphorus: 3.6 mg/dL (ref 2.5–4.6)

## 2023-08-23 LAB — RPR: RPR Ser Ql: NONREACTIVE

## 2023-08-23 MED ORDER — CYANOCOBALAMIN 1000 MCG/ML IJ SOLN
1000.0000 ug | Freq: Every day | INTRAMUSCULAR | Status: DC
  Administered 2023-08-23 – 2023-08-28 (×6): 1000 ug via SUBCUTANEOUS
  Filled 2023-08-23 (×7): qty 1

## 2023-08-23 MED ORDER — CYANOCOBALAMIN 1000 MCG/ML IJ SOLN: 1000.0000 ug | INTRAMUSCULAR | Status: DC

## 2023-08-23 NOTE — [place] FL2 (Signed)
[place] MEDICAID FL2 LEVEL OF CARE FORM     IDENTIFICATION  Patient Name: Katelyn Jackson Birthdate: July 25, 1946 Sex: female Admission Date (Current Location): 08/20/2023  [facility] and [place] Number:  Producer, television/film/video and Address:  [facility] [place], [address] [place], [place] [address]      Provider Number: [id number]  Attending Physician Name and Address:  Elgergawy, Leana Roe, MD  Relative Name and Phone Number:  Nolon Nations; Son; [phone]    Current Level of Care: Hospital Recommended Level of Care: Skilled Nursing Facility Prior Approval Number:    Date Approved/Denied:   PASRR Number: [id number]  Discharge Plan: SNF    Current Diagnoses: Patient Active Problem List   Diagnosis Date Noted   Stroke-like symptoms 08/20/2023   Class 2 obesity 06/12/2023   Dehydration 06/10/2023   Constipation 06/09/2023   Foley catheter in place prior to arrival 06/09/2023   Increased endometrial stripe thickness 06/09/2023   Closed patellar sleeve fracture of left knee 05/20/2023   Fall 05/18/2023   Patella fracture 05/18/2023   Clavicle fracture 05/18/2023   Abnormal urinalysis 05/18/2023   Chronic kidney disease, stage 3b (HCC) 05/18/2023   History of breast cancer 05/18/2023   Intertrigo 04/17/2023   Skin lesion 03/08/2023   Chronic heart failure with preserved ejection fraction (HCC) 12/16/2022   Callus 11/04/2022   Cardiac pacemaker in situ 12/10/2021   Morbid obesity with BMI of 45.0-49.9, adult (HCC) 12/10/2021   Breast lesion 12/07/2021   Dry skin 09/22/2021   Colonization with VRE (vancomycin-resistant enterococcus) 09/22/2021   Osteoarthritis of right knee 08/19/2021   Herpes labialis 08/19/2021   Liver cirrhosis (HCC) 07/22/2021   Abscess    Prosthetic joint infection (HCC)    Abscess of right shoulder 07/21/2021   History of pacemaker 07/21/2021   Pressure injury of skin 07/03/2021   Hypothyroidism    Cellulitis     Postoperative anemia due to acute blood loss 03/19/2021   Dyspnea 03/19/2021   Symptomatic anemia 03/19/2021   Status post right shoulder hemiarthroplasty 03/17/2021   Bradycardia 01/05/2021   CAP (community acquired pneumonia) 01/05/2021   CKD (chronic kidney disease) stage 4, GFR 15-29 ml/min (HCC) 01/05/2021   Bilateral pain of leg and foot 10/08/2020   Abscess of left thumb 06/17/2020   Cellulitis and abscess of left leg 06/17/2020   Herpes zoster 02/18/2020   GERD (gastroesophageal reflux disease) 11/25/2019   Wheezing 10/26/2017   Sciatica of left side 08/29/2017   Morbid obesity (HCC) 07/11/2017   Hyperglycemia 07/11/2017   Low back pain 06/19/2017   Acute renal failure superimposed on stage 3b chronic kidney disease (HCC) 06/19/2017   Hypokalemia 06/19/2017   Acute lower UTI 06/19/2017   Anemia of chronic disease 02/16/2017   Malignant neoplasm of upper-outer quadrant of left breast in female, estrogen receptor positive (HCC) 02/10/2017   Essential hypertension 04/05/2016   Dyslipidemia 04/05/2016   Chronic atrial fibrillation (HCC) 04/05/2016   Pulmonary hypertension (HCC) 04/05/2016   Lymphedema 04/05/2016    Orientation RESPIRATION BLADDER Height & Weight     Self, Time, Situation, Place  Normal (Room Air) Incontinent, External catheter Weight: 202 lb 6.1 oz (91.8 kg) Height:  5\' 1"  (154.9 cm)  BEHAVIORAL SYMPTOMS/MOOD NEUROLOGICAL BOWEL NUTRITION STATUS      Continent Diet  AMBULATORY STATUS COMMUNICATION OF NEEDS Skin   Extensive Assist Verbally Normal  Personal Care Assistance Level of Assistance  Bathing, Feeding, Dressing Bathing Assistance: Maximum assistance Feeding assistance: Maximum assistance Dressing Assistance: Maximum assistance     Functional Limitations Info             SPECIAL CARE FACTORS FREQUENCY  PT (By licensed PT), OT (By licensed OT)     PT Frequency: 5x OT Frequency: 5x             Contractures Contractures Info: Not present    Additional Factors Info  Code Status, Allergies Code Status Info: DNR- Limited Allergies Info: Hibiclens (chlorhexidine Gluconate) and Penicillins           Current Medications (08/23/2023):  This is the current hospital active medication list Current Facility-Administered Medications  Medication Dose Route Frequency Provider Last Rate Last Admin   acetaminophen (TYLENOL) tablet 1,000 mg  1,000 mg Oral Q6H PRN Segars, Christiane Ha, MD       allopurinol (ZYLOPRIM) tablet 50 mg  50 mg Oral Daily Segars, Christiane Ha, MD   50 mg at 08/23/23 4098   apixaban (ELIQUIS) tablet 5 mg  5 mg Oral BID Burnadette Pop, MD   5 mg at 08/23/23 1191   cefTRIAXone (ROCEPHIN) 1 g in sodium chloride 0.9 % 100 mL IVPB  1 g Intravenous Q24H Susa Raring K, MD 200 mL/hr at 08/23/23 0533 1 g at 08/23/23 0533   cyanocobalamin (VITAMIN B12) injection 1,000 mcg  1,000 mcg Subcutaneous Daily Elgergawy, Leana Roe, MD   1,000 mcg at 08/23/23 0533   Followed by   Melene Muller ON 08/30/2023] cyanocobalamin (VITAMIN B12) injection 1,000 mcg  1,000 mcg Subcutaneous Weekly Elgergawy, Leana Roe, MD       ezetimibe (ZETIA) tablet 10 mg  10 mg Oral Daily Leroy Sea, MD   10 mg at 08/23/23 0924   lactulose (CHRONULAC) 10 GM/15ML solution 10 g  10 g Oral TID Dolly Rias, MD   10 g at 08/23/23 0925   levothyroxine (SYNTHROID) tablet 50 mcg  50 mcg Oral Q0600 Dolly Rias, MD   50 mcg at 08/23/23 0533   metoprolol tartrate (LOPRESSOR) injection 5 mg  5 mg Intravenous Q8H PRN Leroy Sea, MD       oxyCODONE (Oxy IR/ROXICODONE) immediate release tablet 2.5 mg  2.5 mg Oral Q6H PRN Dolly Rias, MD   2.5 mg at 08/22/23 2154   pantoprazole (PROTONIX) EC tablet 40 mg  40 mg Oral Daily Dolly Rias, MD   40 mg at 08/23/23 0924   polyethylene glycol (MIRALAX / GLYCOLAX) packet 17 g  17 g Oral BID PRN Dolly Rias, MD   17 g at 08/22/23 1732   pravastatin  (PRAVACHOL) tablet 20 mg  20 mg Oral q1800 Dolly Rias, MD   20 mg at 08/22/23 1732   rifaximin (XIFAXAN) tablet 550 mg  550 mg Oral BID Dolly Rias, MD   550 mg at 08/23/23 4782     Discharge Medications: Please see discharge summary for a list of discharge medications.  Relevant Imaging Results:  Relevant Lab Results:   Additional Information SSN: [id number]  Marliss Coots, LCSW

## 2023-08-23 NOTE — Progress Notes (Signed)
*  PRELIMINARY RESULTS* Echocardiogram 2D Echocardiogram has been performed.  Katelyn Jackson 08/23/2023, 4:18 PM

## 2023-08-23 NOTE — Plan of Care (Signed)

## 2023-08-23 NOTE — Plan of Care (Signed)
  Problem: Health Behavior/Discharge Planning: Goal: Ability to manage health-related needs will improve Outcome: Progressing   Problem: Clinical Measurements: Goal: Ability to maintain clinical measurements within normal limits will improve Outcome: Progressing   Problem: Clinical Measurements: Goal: Will remain free from infection Outcome: Progressing   Problem: Clinical Measurements: Goal: Diagnostic test results will improve Outcome: Progressing   Problem: Clinical Measurements: Goal: Respiratory complications will improve Outcome: Progressing   Problem: Safety: Goal: Ability to remain free from injury will improve Outcome: Progressing   Problem: Pain Managment: Goal: General experience of comfort will improve Outcome: Progressing   Problem: Skin Integrity: Goal: Risk for impaired skin integrity will decrease Outcome: Progressing

## 2023-08-23 NOTE — Progress Notes (Signed)
Physical Therapy Treatment Patient Details Name: Katelyn Jackson MRN: [id number] DOB: [birth year] Today's Date: 08/23/2023   History of Present Illness 77 y.o. female,  Russian-speaking, who was brought in as a level 2 trauma after ground-level fall with no acute traumatic injuries.  However has signs and symptoms consistent with stroke, including R sided weakness; stroke workup in progress; REcently at SNF for rehab post fall with L clavcle and knee fxs; with hx of permanent atrial fibrillation, s/p PPM on AC, decompensated cirrhosis, CKD stage III, HTN, HLD, hypothyroidism, chronic lymphedema, history of breast cancer with radiation therapy, chronic urinary obstruction with chronic indwelling foley catheter    PT Comments  Pt is slowly progressing towards goals. Pt was able to perform bed mobility at Mod A this session; Min A to get trunk to mid line and was able to perform transfers from EOB to recliner at Mod A +2 with RW. Pt was unable to progress gait due to difficulty clearing the RLE from the floor secondary to pain in the L knee. Due to pt current functional status, home set up and available assistance recommending skilled physical therapy services < 3 hours/day in order to decrease risk for falls, injury, immobility, skin break down and re-hospitalization. Pt was fatigued at end of session.    If plan is discharge home, recommend the following: Two people to help with bathing/dressing/bathroom;A lot of help with walking and/or transfers;Two people to help with walking and/or transfers   Can travel by private vehicle     No  Equipment Recommendations  Wheelchair cushion (measurements PT);Other (comment) (defer to post acute)       Precautions / Restrictions Precautions Precautions: Fall Precaution Comments: Limited UE ROM, was able to hold onto her RW Restrictions Weight Bearing Restrictions: No Other Position/Activity Restrictions: OK to weight bear through LUE and L LE without KI  as of 10/11, per Ortho outpt note.  Patient stating she no longer using sling to L UE.     Mobility  Bed Mobility Overal bed mobility: Needs Assistance Bed Mobility: Supine to Sit     Supine to sit: Mod assist     General bed mobility comments: Mod A for scooting toward edge of bed, Min A for getting trunk to head of bed and was able to slowly progress bil LE off EOB.    Transfers Overall transfer level: Needs assistance Equipment used: Rolling walker (2 wheels) Transfers: Sit to/from Stand, Bed to chair/wheelchair/BSC Sit to Stand: Mod assist, +2 physical assistance   Step pivot transfers: Mod assist, +2 physical assistance, +2 safety/equipment       General transfer comment: Mod A +2 from EOB, Pt states that her RLE does not move when standing due to WB on RLE; pt was able to pivot on her R forefoot with assistance for transfer while stepping with her LLE. Pt tried to sit early and was assisted into the chair to prevent early sit and falling. Pt then sat on Edge of recliner and had difficulty keeping feet planted on floor; required assist blocking feet to prevent sliding    Ambulation/Gait               General Gait Details: Unable at this time due to pain in the L Knee pt as unable to fully unweigh the RLE    Modified Rankin (Stroke Patients Only) Modified Rankin (Stroke Patients Only) Pre-Morbid Rankin Score: Moderately severe disability Modified Rankin: Severe disability     Balance Overall balance assessment: Needs assistance  Sitting-balance support: Feet supported Sitting balance-Leahy Scale: Fair     Standing balance support: Reliant on assistive device for balance, Bilateral upper extremity supported Standing balance-Leahy Scale: Poor Standing balance comment: reliant on external support        Cognition Arousal: Alert Behavior During Therapy: WFL for tasks assessed/performed Overall Cognitive Status: Within Functional Limits for tasks assessed            General Comments General comments (skin integrity, edema, etc.): No interpretor; pt conversed well in [place] conveying thoughts and asking questions appropriate for physical therapy session      Pertinent Vitals/Pain Pain Assessment Pain Assessment: Faces Faces Pain Scale: Hurts little more Pain Location: L shoulder and L knee Pain Descriptors / Indicators: Grimacing, Tender, Tightness Pain Intervention(s): Monitored during session, Patient requesting pain meds-RN notified     PT Goals (current goals can now be found in the care plan section) Acute Rehab PT Goals Patient Stated Goal: Be able to move r leg well again PT Goal Formulation: With patient Time For Goal Achievement: 09/04/23 Potential to Achieve Goals: Good Progress towards PT goals: Progressing toward goals    Frequency    Min 1X/week      PT Plan  Continue with current POC       AM-PAC PT "6 Clicks" Mobility   Outcome Measure  Help needed turning from your back to your side while in a flat bed without using bedrails?: A Lot Help needed moving from lying on your back to sitting on the side of a flat bed without using bedrails?: A Lot Help needed moving to and from a bed to a chair (including a wheelchair)?: Total Help needed standing up from a chair using your arms (e.g., wheelchair or bedside chair)?: Total Help needed to walk in hospital room?: Total Help needed climbing 3-5 steps with a railing? : Total 6 Click Score: 8    End of Session Equipment Utilized During Treatment: Gait belt Activity Tolerance: Patient tolerated treatment well Patient left: in chair;with call bell/phone within reach;with chair alarm set Nurse Communication: Mobility status PT Visit Diagnosis: Unsteadiness on feet (R26.81);Other abnormalities of gait and mobility (R26.89);Muscle weakness (generalized) (M62.81);Difficulty in walking, not elsewhere classified (R26.2);Other symptoms and signs involving the nervous  system (R29.898)     Time: 5284-1324 PT Time Calculation (min) (ACUTE ONLY): 29 min  Charges:    $Therapeutic Activity: 23-37 mins PT General Charges $$ ACUTE PT VISIT: 1 Visit                     Harrel Carina, DPT, CLT  Acute Rehabilitation Services Office: [phone] (Secure chat preferred)    Claudia Desanctis 08/23/2023, 2:31 PM

## 2023-08-23 NOTE — TOC Progression Note (Signed)
Transition of Care [facility]) - Progression Note    Patient Details  Name: Katelyn Jackson MRN: [id number] Date of Birth: 01/15/1946  [facility] CM/SW Contact  Mearl Latin, LCSW Phone Number: 08/23/2023, 1:13 PM  Clinical Narrative:    CSW spoke with patient's son to ascertain discharge plan. He reported he spoke with patient and he would like for CSW to try to get insurance approval to return to [employer]. CSW made [name] aware and initiated insurance authorization, Ref# [id number].    Expected Discharge Plan: Skilled Nursing Facility Barriers to Discharge: Continued Medical Work up, English as a second language teacher  Expected Discharge Plan and Services In-house Referral: Clinical Social Work     Living arrangements for the past 2 months: Single Family Home, Skilled Nursing Facility                                       Social Determinants of Health (SDOH) Interventions SDOH Screenings   Food Insecurity: No Food Insecurity (08/21/2023)  Housing: Patient Unable To Answer (08/21/2023)  Transportation Needs: No Transportation Needs (08/21/2023)  Utilities: Not At Risk (08/21/2023)  Alcohol Screen: Low Risk  (06/15/2022)  Depression (PHQ2-9): Low Risk  (04/17/2023)  Financial Resource Strain: Low Risk  (06/15/2022)  Physical Activity: Inactive (06/15/2022)  Social Connections: Socially Isolated (06/15/2022)  Stress: No Stress Concern Present (06/15/2022)  Tobacco Use: Low Risk  (08/21/2023)    Readmission Risk Interventions    07/06/2021   11:49 AM 03/19/2021    3:03 PM  Readmission Risk Prevention Plan  Transportation Screening Complete Complete  HRI or Home Care Consult  Complete  Social Work Consult for Recovery Care Planning/Counseling  Complete  Palliative Care Screening  Not Applicable  Medication Review Oceanographer)  Complete  HRI or Home Care Consult Complete   SW Recovery Care/Counseling Consult Complete   Palliative Care Screening Not  Applicable   Skilled Nursing Facility Complete

## 2023-08-23 NOTE — Progress Notes (Signed)
MRI staff reached out that this patient had been scanned over the weekend without device management Device was checked today by industry and reported to them functioning as programmed. MRI tech asked that we comment on the chart In d/w Dr. Graciela Husbands, felt that review of the device interrogation was all we would need to comment on. Pt has a Abbot dual chamber device with a MRI conditional system, implanted 01/06/2021. Device is programmed VVIR RV lead measurements are good and wnl. In d/w industry rep that checked her device today System has been programmed chronically VVIR in review of their Merlin system data Though A lead appears nonfunctional by his assessment, system remains MRI conditional with appropriate pre-post programming going forward    Francis Dowse, PA-C

## 2023-08-23 NOTE — Progress Notes (Signed)
Status   Specimen Description BLOOD LEFT ANTECUBITAL  Final   Special Requests   Final    BOTTLES DRAWN AEROBIC ONLY Blood Culture results may not be optimal due to an inadequate volume of blood received in culture bottles   Culture  Setup Time   Final    GRAM POSITIVE RODS AEROBIC BOTTLE ONLY CRITICAL RESULT CALLED TO, READ BACK BY AND VERIFIED WITH: J [facility]  08/22/23 MK    Culture   Final    CULTURE REINCUBATED FOR BETTER GROWTH Performed at [facility], [address] [place] [address], [place], [place] [address]    Report Status PENDING  Incomplete  Urine Culture     Status: Abnormal (Preliminary result)   Collection Time: 08/20/23 11:05 PM   Specimen: In/Out Cath Urine  Result Value Ref Range Status   Specimen Description IN/OUT CATH URINE  Final   Special Requests ADDED 2305  Final   Culture (A)  Final    >=100,000 COLONIES/mL KLEBSIELLA PNEUMONIAE CONFIRMATION OF SUSCEPTIBILITIES IN PROGRESS Performed at [facility], [address] [place], [place] [address]    Report Status PENDING  Incomplete    Radiology Reports CT HIP RIGHT WO CONTRAST  Result Date: 08/22/2023 CLINICAL DATA:  Hip pain.  Recent fall. EXAM: CT OF THE RIGHT HIP WITHOUT CONTRAST TECHNIQUE: Multidetector CT imaging of the right hip was performed according to the standard protocol. Multiplanar CT image reconstructions were also generated. RADIATION DOSE REDUCTION: This exam was performed  according to the departmental dose-optimization program which includes automated exposure control, adjustment of the mA and/or kV according to patient size and/or use of iterative reconstruction technique. COMPARISON:  Plain films 08/20/2023 FINDINGS: Bones/Joint/Cartilage No acute bony abnormality. Specifically, no fracture, subluxation, or dislocation. Mild joint space narrowing within the right hip joint. Ligaments Suboptimally assessed by CT. Muscles and Tendons Unremarkable Soft tissues Unremarkable IMPRESSION: No evidence of right hip fracture. Electronically Signed   By: Charlett Nose M.D.   On: 08/22/2023 23:33   VAS US CAROTID  Result Date: 08/22/2023 Carotid Arterial Duplex Study Patient Name:  IVONE LICHT  Date of Exam:   08/22/2023 Medical Rec #: [id number]          Accession #:    [id number] Date of Birth: Apr 13, 1946          Patient Gender: F Patient Age:   77 years Exam Location:  [facility] Procedure:      VAS US CAROTID Referring Phys: Bess Harvest [facility] --------------------------------------------------------------------------------  Indications:       TIA. Risk Factors:      Hypertension, hyperlipidemia, no history of smoking. Other Factors:     Afib, PM. Limitations        Today's exam was limited due to the body habitus of the                    patient and tortuous vessels. Comparison Study:  No previous exams Performing Technologist: Jody Hill RVT, RDMS  Examination Guidelines: A complete evaluation includes B-mode imaging, spectral Doppler, color Doppler, and power Doppler as needed of all accessible portions of each vessel. Bilateral testing is considered an integral part of a complete examination. Limited examinations for reoccurring indications may be performed as noted.  Right Carotid Findings: +----------+--------+--------+--------+------------------+--------+           PSV cm/sEDV cm/sStenosisPlaque DescriptionComments  +----------+--------+--------+--------+------------------+--------+ CCA Prox  76      12  Status   Specimen Description BLOOD LEFT ANTECUBITAL  Final   Special Requests   Final    BOTTLES DRAWN AEROBIC ONLY Blood Culture results may not be optimal due to an inadequate volume of blood received in culture bottles   Culture  Setup Time   Final    GRAM POSITIVE RODS AEROBIC BOTTLE ONLY CRITICAL RESULT CALLED TO, READ BACK BY AND VERIFIED WITH: J [facility]  08/22/23 MK    Culture   Final    CULTURE REINCUBATED FOR BETTER GROWTH Performed at [facility], [address] [place] [address], [place], [place] [address]    Report Status PENDING  Incomplete  Urine Culture     Status: Abnormal (Preliminary result)   Collection Time: 08/20/23 11:05 PM   Specimen: In/Out Cath Urine  Result Value Ref Range Status   Specimen Description IN/OUT CATH URINE  Final   Special Requests ADDED 2305  Final   Culture (A)  Final    >=100,000 COLONIES/mL KLEBSIELLA PNEUMONIAE CONFIRMATION OF SUSCEPTIBILITIES IN PROGRESS Performed at [facility], [address] [place], [place] [address]    Report Status PENDING  Incomplete    Radiology Reports CT HIP RIGHT WO CONTRAST  Result Date: 08/22/2023 CLINICAL DATA:  Hip pain.  Recent fall. EXAM: CT OF THE RIGHT HIP WITHOUT CONTRAST TECHNIQUE: Multidetector CT imaging of the right hip was performed according to the standard protocol. Multiplanar CT image reconstructions were also generated. RADIATION DOSE REDUCTION: This exam was performed  according to the departmental dose-optimization program which includes automated exposure control, adjustment of the mA and/or kV according to patient size and/or use of iterative reconstruction technique. COMPARISON:  Plain films 08/20/2023 FINDINGS: Bones/Joint/Cartilage No acute bony abnormality. Specifically, no fracture, subluxation, or dislocation. Mild joint space narrowing within the right hip joint. Ligaments Suboptimally assessed by CT. Muscles and Tendons Unremarkable Soft tissues Unremarkable IMPRESSION: No evidence of right hip fracture. Electronically Signed   By: Charlett Nose M.D.   On: 08/22/2023 23:33   VAS US CAROTID  Result Date: 08/22/2023 Carotid Arterial Duplex Study Patient Name:  IVONE LICHT  Date of Exam:   08/22/2023 Medical Rec #: [id number]          Accession #:    [id number] Date of Birth: Apr 13, 1946          Patient Gender: F Patient Age:   77 years Exam Location:  [facility] Procedure:      VAS US CAROTID Referring Phys: Bess Harvest [facility] --------------------------------------------------------------------------------  Indications:       TIA. Risk Factors:      Hypertension, hyperlipidemia, no history of smoking. Other Factors:     Afib, PM. Limitations        Today's exam was limited due to the body habitus of the                    patient and tortuous vessels. Comparison Study:  No previous exams Performing Technologist: Jody Hill RVT, RDMS  Examination Guidelines: A complete evaluation includes B-mode imaging, spectral Doppler, color Doppler, and power Doppler as needed of all accessible portions of each vessel. Bilateral testing is considered an integral part of a complete examination. Limited examinations for reoccurring indications may be performed as noted.  Right Carotid Findings: +----------+--------+--------+--------+------------------+--------+           PSV cm/sEDV cm/sStenosisPlaque DescriptionComments  +----------+--------+--------+--------+------------------+--------+ CCA Prox  76      12  PROGRESS NOTE                                                                                                                                                                                                             Patient Demographics:    Oleda Borski, is a 77 y.o. female, DOB - 1945-12-26, ZOX:[id number]  Outpatient Primary MD for the patient is Plotnikov, Georgina Quint, MD    LOS - 3  Admit date - 08/20/2023    Chief Complaint  Patient presents with   Fall       Brief Narrative (HPI from H&P)     3 Russian-speaking female with history of permanent A-fib on anticoagulation, status post pacemaker, decompensated liver cirrhosis, CKD stage 2 hypertension, hyperlipidemia, hypothyroidism, chronic lymphedema, history of breast cancer status post radiation therapy, recent trauma with fracture of left upper and lower extremities, wheelchair dependent who was brought from home after a level 2 trauma following ground-level fall.  She lives with her son.  Patient noted right-sided numbness involving her right face, arm and leg with left-sided weakness before fall.  Suspected stroke on admission but MRI of the brain and cervical spine did not show any acute findings.  She was admitted for further workup, it was significant for UTI.   Subjective:    [name] today reports good night sleep, denies abdominal pain, nausea, vomiting, still reports right lower extremity weakness, mainly upon standing.     Assessment  & Plan :   Fall/strokelike symptoms:  - Fell at home.  Developed right-sided numbness/weakness - multiple falls over the last 1 mth. Negative MRI of the brain, cervical spine did not show any acute findings but showed mild C5-6 spinal canal stenosis,moderate bilateral C3-4 and C4-5 neural foraminal stenosis.  Her skeletal survey did not show any acute findings.  PT/OT consulted. -Right CT head with no acute  finding as well -MRI lumbar spine is pending - Patient has severe weakness and deconditioning at baseline.  Recent hospitalization from  07/10 to 05/24/23 for mechanical fall with left patellar and left distal clavicle fracture.  Recently had surgery of the right shoulder.  Cannot move right shoulder, sided range of motion and strength improving, uses wheelchair at home, continue PT OT, may require SNF. -Workup negative for acute CVA, as well as no significant findings on workup  PROGRESS NOTE                                                                                                                                                                                                             Patient Demographics:    Oleda Borski, is a 77 y.o. female, DOB - 1945-12-26, ZOX:[id number]  Outpatient Primary MD for the patient is Plotnikov, Georgina Quint, MD    LOS - 3  Admit date - 08/20/2023    Chief Complaint  Patient presents with   Fall       Brief Narrative (HPI from H&P)     3 Russian-speaking female with history of permanent A-fib on anticoagulation, status post pacemaker, decompensated liver cirrhosis, CKD stage 2 hypertension, hyperlipidemia, hypothyroidism, chronic lymphedema, history of breast cancer status post radiation therapy, recent trauma with fracture of left upper and lower extremities, wheelchair dependent who was brought from home after a level 2 trauma following ground-level fall.  She lives with her son.  Patient noted right-sided numbness involving her right face, arm and leg with left-sided weakness before fall.  Suspected stroke on admission but MRI of the brain and cervical spine did not show any acute findings.  She was admitted for further workup, it was significant for UTI.   Subjective:    [name] today reports good night sleep, denies abdominal pain, nausea, vomiting, still reports right lower extremity weakness, mainly upon standing.     Assessment  & Plan :   Fall/strokelike symptoms:  - Fell at home.  Developed right-sided numbness/weakness - multiple falls over the last 1 mth. Negative MRI of the brain, cervical spine did not show any acute findings but showed mild C5-6 spinal canal stenosis,moderate bilateral C3-4 and C4-5 neural foraminal stenosis.  Her skeletal survey did not show any acute findings.  PT/OT consulted. -Right CT head with no acute  finding as well -MRI lumbar spine is pending - Patient has severe weakness and deconditioning at baseline.  Recent hospitalization from  07/10 to 05/24/23 for mechanical fall with left patellar and left distal clavicle fracture.  Recently had surgery of the right shoulder.  Cannot move right shoulder, sided range of motion and strength improving, uses wheelchair at home, continue PT OT, may require SNF. -Workup negative for acute CVA, as well as no significant findings on workup  Status   Specimen Description BLOOD LEFT ANTECUBITAL  Final   Special Requests   Final    BOTTLES DRAWN AEROBIC ONLY Blood Culture results may not be optimal due to an inadequate volume of blood received in culture bottles   Culture  Setup Time   Final    GRAM POSITIVE RODS AEROBIC BOTTLE ONLY CRITICAL RESULT CALLED TO, READ BACK BY AND VERIFIED WITH: J [facility]  08/22/23 MK    Culture   Final    CULTURE REINCUBATED FOR BETTER GROWTH Performed at [facility], [address] [place] [address], [place], [place] [address]    Report Status PENDING  Incomplete  Urine Culture     Status: Abnormal (Preliminary result)   Collection Time: 08/20/23 11:05 PM   Specimen: In/Out Cath Urine  Result Value Ref Range Status   Specimen Description IN/OUT CATH URINE  Final   Special Requests ADDED 2305  Final   Culture (A)  Final    >=100,000 COLONIES/mL KLEBSIELLA PNEUMONIAE CONFIRMATION OF SUSCEPTIBILITIES IN PROGRESS Performed at [facility], [address] [place], [place] [address]    Report Status PENDING  Incomplete    Radiology Reports CT HIP RIGHT WO CONTRAST  Result Date: 08/22/2023 CLINICAL DATA:  Hip pain.  Recent fall. EXAM: CT OF THE RIGHT HIP WITHOUT CONTRAST TECHNIQUE: Multidetector CT imaging of the right hip was performed according to the standard protocol. Multiplanar CT image reconstructions were also generated. RADIATION DOSE REDUCTION: This exam was performed  according to the departmental dose-optimization program which includes automated exposure control, adjustment of the mA and/or kV according to patient size and/or use of iterative reconstruction technique. COMPARISON:  Plain films 08/20/2023 FINDINGS: Bones/Joint/Cartilage No acute bony abnormality. Specifically, no fracture, subluxation, or dislocation. Mild joint space narrowing within the right hip joint. Ligaments Suboptimally assessed by CT. Muscles and Tendons Unremarkable Soft tissues Unremarkable IMPRESSION: No evidence of right hip fracture. Electronically Signed   By: Charlett Nose M.D.   On: 08/22/2023 23:33   VAS US CAROTID  Result Date: 08/22/2023 Carotid Arterial Duplex Study Patient Name:  IVONE LICHT  Date of Exam:   08/22/2023 Medical Rec #: [id number]          Accession #:    [id number] Date of Birth: Apr 13, 1946          Patient Gender: F Patient Age:   77 years Exam Location:  [facility] Procedure:      VAS US CAROTID Referring Phys: Bess Harvest [facility] --------------------------------------------------------------------------------  Indications:       TIA. Risk Factors:      Hypertension, hyperlipidemia, no history of smoking. Other Factors:     Afib, PM. Limitations        Today's exam was limited due to the body habitus of the                    patient and tortuous vessels. Comparison Study:  No previous exams Performing Technologist: Jody Hill RVT, RDMS  Examination Guidelines: A complete evaluation includes B-mode imaging, spectral Doppler, color Doppler, and power Doppler as needed of all accessible portions of each vessel. Bilateral testing is considered an integral part of a complete examination. Limited examinations for reoccurring indications may be performed as noted.  Right Carotid Findings: +----------+--------+--------+--------+------------------+--------+           PSV cm/sEDV cm/sStenosisPlaque DescriptionComments  +----------+--------+--------+--------+------------------+--------+ CCA Prox  76      12  PROGRESS NOTE                                                                                                                                                                                                             Patient Demographics:    Oleda Borski, is a 77 y.o. female, DOB - 1945-12-26, ZOX:[id number]  Outpatient Primary MD for the patient is Plotnikov, Georgina Quint, MD    LOS - 3  Admit date - 08/20/2023    Chief Complaint  Patient presents with   Fall       Brief Narrative (HPI from H&P)     3 Russian-speaking female with history of permanent A-fib on anticoagulation, status post pacemaker, decompensated liver cirrhosis, CKD stage 2 hypertension, hyperlipidemia, hypothyroidism, chronic lymphedema, history of breast cancer status post radiation therapy, recent trauma with fracture of left upper and lower extremities, wheelchair dependent who was brought from home after a level 2 trauma following ground-level fall.  She lives with her son.  Patient noted right-sided numbness involving her right face, arm and leg with left-sided weakness before fall.  Suspected stroke on admission but MRI of the brain and cervical spine did not show any acute findings.  She was admitted for further workup, it was significant for UTI.   Subjective:    [name] today reports good night sleep, denies abdominal pain, nausea, vomiting, still reports right lower extremity weakness, mainly upon standing.     Assessment  & Plan :   Fall/strokelike symptoms:  - Fell at home.  Developed right-sided numbness/weakness - multiple falls over the last 1 mth. Negative MRI of the brain, cervical spine did not show any acute findings but showed mild C5-6 spinal canal stenosis,moderate bilateral C3-4 and C4-5 neural foraminal stenosis.  Her skeletal survey did not show any acute findings.  PT/OT consulted. -Right CT head with no acute  finding as well -MRI lumbar spine is pending - Patient has severe weakness and deconditioning at baseline.  Recent hospitalization from  07/10 to 05/24/23 for mechanical fall with left patellar and left distal clavicle fracture.  Recently had surgery of the right shoulder.  Cannot move right shoulder, sided range of motion and strength improving, uses wheelchair at home, continue PT OT, may require SNF. -Workup negative for acute CVA, as well as no significant findings on workup  PROGRESS NOTE                                                                                                                                                                                                             Patient Demographics:    Oleda Borski, is a 77 y.o. female, DOB - 1945-12-26, [id number]  Outpatient Primary MD for the patient is Plotnikov, Georgina Quint, MD    LOS - 3  Admit date - 08/20/2023    Chief Complaint  Patient presents with   Fall       Brief Narrative (HPI from H&P)     3 Russian-speaking female with history of permanent A-fib on anticoagulation, status post pacemaker, decompensated liver cirrhosis, CKD stage 2 hypertension, hyperlipidemia, hypothyroidism, chronic lymphedema, history of breast cancer status post radiation therapy, recent trauma with fracture of left upper and lower extremities, wheelchair dependent who was brought from home after a level 2 trauma following ground-level fall.  She lives with her son.  Patient noted right-sided numbness involving her right face, arm and leg with left-sided weakness before fall.  Suspected stroke on admission but MRI of the brain and cervical spine did not show any acute findings.  She was admitted for further workup, it was significant for UTI.   Subjective:    [name] today reports good night sleep, denies abdominal pain, nausea, vomiting, still reports right lower extremity weakness, mainly upon standing.     Assessment  & Plan :   Fall/strokelike symptoms:  - Fell at home.  Developed right-sided numbness/weakness - multiple falls over the last 1 mth. Negative MRI of the brain, cervical spine did not show any acute findings but showed mild C5-6 spinal canal stenosis,moderate bilateral C3-4 and C4-5 neural foraminal stenosis.  Her skeletal survey did not show any acute findings.  PT/OT consulted. -Right CT head with no acute  finding as well -MRI lumbar spine is pending - Patient has severe weakness and deconditioning at baseline.  Recent hospitalization from  07/10 to 05/24/23 for mechanical fall with left patellar and left distal clavicle fracture.  Recently had surgery of the right shoulder.  Cannot move right shoulder, sided range of motion and strength improving, uses wheelchair at home, continue PT OT, may require SNF. -Workup negative for acute CVA, as well as no significant findings on workup  PROGRESS NOTE                                                                                                                                                                                                             Patient Demographics:    Oleda Borski, is a 77 y.o. female, DOB - 1945-12-26, ZOX:[id number]  Outpatient Primary MD for the patient is Plotnikov, Georgina Quint, MD    LOS - 3  Admit date - 08/20/2023    Chief Complaint  Patient presents with   Fall       Brief Narrative (HPI from H&P)     3 Russian-speaking female with history of permanent A-fib on anticoagulation, status post pacemaker, decompensated liver cirrhosis, CKD stage 2 hypertension, hyperlipidemia, hypothyroidism, chronic lymphedema, history of breast cancer status post radiation therapy, recent trauma with fracture of left upper and lower extremities, wheelchair dependent who was brought from home after a level 2 trauma following ground-level fall.  She lives with her son.  Patient noted right-sided numbness involving her right face, arm and leg with left-sided weakness before fall.  Suspected stroke on admission but MRI of the brain and cervical spine did not show any acute findings.  She was admitted for further workup, it was significant for UTI.   Subjective:    [name] today reports good night sleep, denies abdominal pain, nausea, vomiting, still reports right lower extremity weakness, mainly upon standing.     Assessment  & Plan :   Fall/strokelike symptoms:  - Fell at home.  Developed right-sided numbness/weakness - multiple falls over the last 1 mth. Negative MRI of the brain, cervical spine did not show any acute findings but showed mild C5-6 spinal canal stenosis,moderate bilateral C3-4 and C4-5 neural foraminal stenosis.  Her skeletal survey did not show any acute findings.  PT/OT consulted. -Right CT head with no acute  finding as well -MRI lumbar spine is pending - Patient has severe weakness and deconditioning at baseline.  Recent hospitalization from  07/10 to 05/24/23 for mechanical fall with left patellar and left distal clavicle fracture.  Recently had surgery of the right shoulder.  Cannot move right shoulder, sided range of motion and strength improving, uses wheelchair at home, continue PT OT, may require SNF. -Workup negative for acute CVA, as well as no significant findings on workup  PROGRESS NOTE                                                                                                                                                                                                             Patient Demographics:    Oleda Borski, is a 77 y.o. female, DOB - 1945-12-26, [id number]  Outpatient Primary MD for the patient is Plotnikov, Georgina Quint, MD    LOS - 3  Admit date - 08/20/2023    Chief Complaint  Patient presents with   Fall       Brief Narrative (HPI from H&P)     3 Russian-speaking female with history of permanent A-fib on anticoagulation, status post pacemaker, decompensated liver cirrhosis, CKD stage 2 hypertension, hyperlipidemia, hypothyroidism, chronic lymphedema, history of breast cancer status post radiation therapy, recent trauma with fracture of left upper and lower extremities, wheelchair dependent who was brought from home after a level 2 trauma following ground-level fall.  She lives with her son.  Patient noted right-sided numbness involving her right face, arm and leg with left-sided weakness before fall.  Suspected stroke on admission but MRI of the brain and cervical spine did not show any acute findings.  She was admitted for further workup, it was significant for UTI.   Subjective:    [name] today reports good night sleep, denies abdominal pain, nausea, vomiting, still reports right lower extremity weakness, mainly upon standing.     Assessment  & Plan :   Fall/strokelike symptoms:  - Fell at home.  Developed right-sided numbness/weakness - multiple falls over the last 1 mth. Negative MRI of the brain, cervical spine did not show any acute findings but showed mild C5-6 spinal canal stenosis,moderate bilateral C3-4 and C4-5 neural foraminal stenosis.  Her skeletal survey did not show any acute findings.  PT/OT consulted. -Right CT head with no acute  finding as well -MRI lumbar spine is pending - Patient has severe weakness and deconditioning at baseline.  Recent hospitalization from  07/10 to 05/24/23 for mechanical fall with left patellar and left distal clavicle fracture.  Recently had surgery of the right shoulder.  Cannot move right shoulder, sided range of motion and strength improving, uses wheelchair at home, continue PT OT, may require SNF. -Workup negative for acute CVA, as well as no significant findings on workup

## 2023-08-24 ENCOUNTER — Inpatient Hospital Stay (HOSPITAL_COMMUNITY): Payer: Medicare HMO

## 2023-08-24 DIAGNOSIS — N3000 Acute cystitis without hematuria: Secondary | ICD-10-CM | POA: Diagnosis not present

## 2023-08-24 DIAGNOSIS — N179 Acute kidney failure, unspecified: Secondary | ICD-10-CM | POA: Diagnosis not present

## 2023-08-24 LAB — BASIC METABOLIC PANEL
Anion gap: 10 (ref 5–15)
BUN: 20 mg/dL (ref 8–23)
CO2: 25 mmol/L (ref 22–32)
Calcium: 9.7 mg/dL (ref 8.9–10.3)
Chloride: 106 mmol/L (ref 98–111)
Creatinine, Ser: 1.38 mg/dL — ABNORMAL HIGH (ref 0.44–1.00)
GFR, Estimated: 39 mL/min — ABNORMAL LOW (ref >60)
Glucose, Bld: 87 mg/dL (ref 70–99)
Potassium: 3.6 mmol/L (ref 3.5–5.1)
Sodium: 141 mmol/L (ref 135–145)

## 2023-08-24 LAB — CBC
HCT: 25.1 % — ABNORMAL LOW (ref 36.0–46.0)
Hemoglobin: 7.8 g/dL — ABNORMAL LOW (ref 12.0–15.0)
MCH: 30.6 pg (ref 26.0–34.0)
MCHC: 31.1 g/dL (ref 30.0–36.0)
MCV: 98.4 fL (ref 80.0–100.0)
Platelets: 137 10*3/uL — ABNORMAL LOW (ref 150–400)
RBC: 2.55 MIL/uL — ABNORMAL LOW (ref 3.87–5.11)
RDW: 14.2 % (ref 11.5–15.5)
WBC: 4 10*3/uL (ref 4.0–10.5)
nRBC: 0 % (ref 0.0–0.2)

## 2023-08-24 LAB — CULTURE, BLOOD (ROUTINE X 2)

## 2023-08-24 LAB — URINE CULTURE: Culture: >=100000 — AB

## 2023-08-24 MED ORDER — SODIUM CHLORIDE 0.9 % IV SOLN
1.0000 g | Freq: Two times a day (BID) | INTRAVENOUS | Status: DC
  Administered 2023-08-24 – 2023-08-28 (×8): 1 g via INTRAVENOUS
  Filled 2023-08-24 (×9): qty 20

## 2023-08-24 NOTE — Plan of Care (Signed)
Problem: Education: Goal: Knowledge of General Education information will improve Description: Including pain rating scale, medication(s)/side effects and non-pharmacologic comfort measures Outcome: Progressing   Problem: Clinical Measurements: Goal: Will remain free from infection Outcome: Progressing   Problem: Activity: Goal: Risk for activity intolerance will decrease Outcome: Progressing   Problem: Elimination: Goal: Will not experience complications related to bowel motility Outcome: Progressing   Problem: Pain Managment: Goal: General experience of comfort will improve Outcome: Progressing

## 2023-08-24 NOTE — Progress Notes (Signed)
Urine culture grew out ESBL klebsiella. Dr. Randol Kern wants a few days of merrem before discharge. 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  Ulyses Southward, PharmD, BCIDP, AAHIVP, CPP Infectious Disease Pharmacist 08/24/2023 9:50 AM

## 2023-08-24 NOTE — Progress Notes (Signed)
Physical Therapy Treatment Patient Details Name: Katelyn Jackson MRN: [id number] DOB: [birth year] Today's Date: 08/24/2023   History of Present Illness 77 y.o. female,  Russian-speaking, who was brought in as a level 2 trauma after ground-level fall with no acute traumatic injuries.  However has signs and symptoms consistent with stroke, including R sided weakness; Head CT and MRI brain negative for acute changes; MRI lumbar spine pending 10/17; REcently at SNF for rehab post fall with L clavcle and knee fxs; with hx of permanent atrial fibrillation, s/p PPM on AC, decompensated cirrhosis, CKD stage III, HTN, HLD, hypothyroidism, chronic lymphedema, history of breast cancer with radiation therapy, chronic urinary obstruction with chronic indwelling foley catheter    PT Comments  Patient willing to work on strengthening bil LEs and basic mobility. Able to sit EOB with mod assist and return to supine with min assist. She can laterally scoot along EOB with min assist with use of bed pad. No RW in room or +2 available to attempt standing. Tolerated LE exercises well.    If plan is discharge home, recommend the following: Two people to help with bathing/dressing/bathroom;A lot of help with walking and/or transfers;Two people to help with walking and/or transfers   Can travel by private vehicle     No  Equipment Recommendations  Wheelchair cushion (measurements PT);Other (comment) (defer to post acute)    Recommendations for Other Services       Precautions / Restrictions Precautions Precautions: Fall Precaution Comments: Limited UE ROM, was able to hold onto her RW Restrictions Weight Bearing Restrictions: No Other Position/Activity Restrictions: OK to weight bear through LUE and L LE without KI as of 10/11, per Ortho outpt note.  Patient stating she no longer using sling to L UE.     Mobility  Bed Mobility Overal bed mobility: Needs Assistance Bed Mobility: Supine to Sit, Sit to  Supine     Supine to sit: Mod assist Sit to supine: Min assist   General bed mobility comments: Mod A for scooting toward edge of bed, min assist to raise legs onto bed; bed in trendelenburg for scoot to [facility] with +1 assist    Transfers Overall transfer level: Needs assistance Equipment used: None Transfers: Bed to chair/wheelchair/BSC            Lateral/Scoot Transfers: Min assist General transfer comment: no +2 and no RW in room; lateral scoot to her right x 6 scoots with min assist and bed pad    Ambulation/Gait               General Gait Details: Unable at this time due to pain in the L Knee pt as unable to fully [place] the RLE   Stairs             Wheelchair Mobility     Tilt Bed    Modified Rankin (Stroke Patients Only) Modified Rankin (Stroke Patients Only) Pre-Morbid Rankin Score: Moderately severe disability Modified Rankin: Severe disability     Balance Overall balance assessment: Needs assistance Sitting-balance support: Feet supported Sitting balance-Leahy Scale: Fair                                      Cognition Arousal: Alert Behavior During Therapy: WFL for tasks assessed/performed Overall Cognitive Status: Within Functional Limits for tasks assessed  Exercises General Exercises - Lower Extremity Ankle Circles/Pumps: AROM, Both, 10 reps Quad Sets: AROM, Both, 10 reps Long Arc Quad: AROM, Both, 10 reps Heel Slides: AROM, Both, 10 reps Hip ABduction/ADduction: AROM, Both, 20 reps, Supine, Seated Hip Flexion/Marching: AROM, Both, 10 reps, Seated    General Comments General comments (skin integrity, edema, etc.): No interpreter required      Pertinent Vitals/Pain Pain Assessment Pain Assessment: Faces Faces Pain Scale: Hurts little more Pain Location: rt hip Pain Descriptors / Indicators: Grimacing, Tender Pain Intervention(s): Limited activity  within patient's tolerance, Monitored during session    Home Living                          Prior Function            PT Goals (current goals can now be found in the care plan section) Acute Rehab PT Goals Patient Stated Goal: Be able to move r leg well again PT Goal Formulation: With patient Time For Goal Achievement: 09/04/23 Potential to Achieve Goals: Good Progress towards PT goals: Progressing toward goals    Frequency    Min 1X/week      PT Plan      Co-evaluation              AM-PAC PT "6 Clicks" Mobility   Outcome Measure  Help needed turning from your back to your side while in a flat bed without using bedrails?: A Lot Help needed moving from lying on your back to sitting on the side of a flat bed without using bedrails?: A Lot Help needed moving to and from a bed to a chair (including a wheelchair)?: Total Help needed standing up from a chair using your arms (e.g., wheelchair or bedside chair)?: Total Help needed to walk in hospital room?: Total Help needed climbing 3-5 steps with a railing? : Total 6 Click Score: 8    End of Session   Activity Tolerance: Patient tolerated treatment well Patient left: with call bell/phone within reach;in bed;with bed alarm set (chair position) Nurse Communication: Mobility status PT Visit Diagnosis: Unsteadiness on feet (R26.81);Other abnormalities of gait and mobility (R26.89);Muscle weakness (generalized) (M62.81);Difficulty in walking, not elsewhere classified (R26.2);Other symptoms and signs involving the nervous system (R29.898)     Time: 4132-4401 PT Time Calculation (min) (ACUTE ONLY): 26 min  Charges:    $Therapeutic Exercise: 8-22 mins $Therapeutic Activity: 8-22 mins PT General Charges $$ ACUTE PT VISIT: 1 Visit                      Jerolyn Center, PT Acute Rehabilitation Services  Office [phone]    Zena Amos 08/24/2023, 11:18 AM

## 2023-08-24 NOTE — TOC Progression Note (Addendum)
Transition of Care [facility]) - Progression Note    Patient Details  Name: Katelyn Jackson MRN: [id number] Date of Birth: 12/27/1945  [facility] CM/SW Contact  Mearl Latin, LCSW Phone Number: 08/24/2023, 8:36 AM  Clinical Narrative:    8:36 AM-Insurance approval still pending. Will likely go to Peer to Peer request but will continue to monitor.   1:55 PM-CSW received request from insurance for updated MRI and echo and PT notes. CSW submitted all for review.   3:23 PM-CSW received request from insurance for Peer to peer for SNF, call [phone] option #5, deadline Fri at 10:30am. Member # [id number]. Info sent to MD.   5:02 PM-Peer to peer completed and denied. CSW updated patient's son. He will speak with patient tonight to see if they would like to pay for [name] with patient's social security check versus home. CSW confirmed that patient would be required to pay her monthly check minus $70 upon admission and must stay 30 days for the Medicaid to activate.    Expected Discharge Plan: Skilled Nursing Facility Barriers to Discharge: Continued Medical Work up, English as a second language teacher  Expected Discharge Plan and Services In-house Referral: Clinical Social Work     Living arrangements for the past 2 months: Single Family Home, Skilled Nursing Facility                                       Social Determinants of Health (SDOH) Interventions SDOH Screenings   Food Insecurity: No Food Insecurity (08/21/2023)  Housing: Patient Unable To Answer (08/21/2023)  Transportation Needs: No Transportation Needs (08/21/2023)  Utilities: Not At Risk (08/21/2023)  Alcohol Screen: Low Risk  (06/15/2022)  Depression (PHQ2-9): Low Risk  (04/17/2023)  Financial Resource Strain: Low Risk  (06/15/2022)  Physical Activity: Inactive (06/15/2022)  Social Connections: Socially Isolated (06/15/2022)  Stress: No Stress Concern Present (06/15/2022)  Tobacco Use: Low Risk  (08/21/2023)     Readmission Risk Interventions    07/06/2021   11:49 AM 03/19/2021    3:03 PM  Readmission Risk Prevention Plan  Transportation Screening Complete Complete  HRI or Home Care Consult  Complete  Social Work Consult for Recovery Care Planning/Counseling  Complete  Palliative Care Screening  Not Applicable  Medication Review Oceanographer)  Complete  HRI or Home Care Consult Complete   SW Recovery Care/Counseling Consult Complete   Palliative Care Screening Not Applicable   Skilled Nursing Facility Complete

## 2023-08-24 NOTE — Progress Notes (Signed)
   08/24/23 1302  What Happened  Was fall witnessed? Yes ([name] Training and development officer)  Who witnessed fall? Nurse Techs  Patients activity before fall to/from bed, chair, or stretcher (Tech's advised patient sat at edge of chair and slid to the floor when they assisted her to the chair.)  Point of contact buttocks  Was patient injured? No  Provider Notification  Provider Name/Title Dr. Hector Shade  Date Provider Notified 08/24/23  Time Provider Notified 1302  Method of Notification Call (secure message)  Notification Reason Fall  Follow Up  Family notified Yes - comment ([name])  Time family notified 1310  Additional tests No  Adult Fall Risk Assessment  Risk Factor Category (scoring not indicated) High fall risk per protocol (document High fall risk)  Patient Fall Risk Level High fall risk  Adult Fall Risk Interventions  Required Bundle Interventions *See Row Information* High fall risk - low, moderate, and high requirements implemented  Additional Interventions Use of appropriate toileting equipment (bedpan, BSC, etc.)  Fall intervention(s) refused/Patient educated regarding refusal Nonskid socks  Screening for Fall Injury Risk (To be completed on HIGH fall risk patients) - Assessing Need for Floor Mats  Risk For Fall Injury- Criteria for Floor Mats Admitted as a result of a fall  Will Implement Floor Mats Yes  Pain Assessment  Pain Scale 0-10  Pain Score 0  Neurological  Neuro (WDL) X  Level of Consciousness Alert  Orientation Level Oriented X4  Cognition Follows commands;Appropriate at baseline;Appropriate attention/concentration;Appropriate safety awareness;Appropriate judgement;Appropriate for developmental age  Speech Clear  R Pupil Size (mm) 3  R Pupil Shape Round  R Pupil Reaction Brisk  L Pupil Size (mm) 3  L Pupil Shape Round  L Pupil Reaction Brisk  Neuro Symptoms None  Musculoskeletal  Musculoskeletal (WDL) X  Assistive Device Front wheel walker  Generalized Weakness  Yes  Weight Bearing Restrictions No  Integumentary  Integumentary (WDL) X  Skin Color Appropriate for ethnicity  Skin Condition Dry  Skin Integrity Ecchymosis  Erythema/Redness Location Buttocks  Erythema/Redness Location Orientation Bilateral;Right;Upper  Skin Turgor Non-tenting

## 2023-08-24 NOTE — Care Management Important Message (Signed)
Important Message  Patient Details  Name: Katelyn Jackson MRN: [id number] Date of Birth: 15-Sep-1946   Important Message Given:  Yes - Medicare IM     Dorena Bodo 08/24/2023, 2:58 PM

## 2023-08-24 NOTE — Progress Notes (Signed)
PROGRESS NOTE                                                                                                                                                                                                             Patient Demographics:    Katelyn Jackson, is a 77 y.o. female, DOB - 22-Oct-1946, WUJ:[id number]  Outpatient Primary MD for the patient is Jackson, Katelyn Quint, MD    LOS - 4  Admit date - 08/20/2023    Chief Complaint  Patient presents with   Fall       Brief Narrative (HPI from H&P)     51 Russian-speaking female with history of permanent A-fib on anticoagulation, status post pacemaker, decompensated liver cirrhosis, CKD stage 2 hypertension, hyperlipidemia, hypothyroidism, chronic lymphedema, history of breast cancer status post radiation therapy, recent trauma with fracture of left upper and lower extremities, wheelchair dependent who was brought from home after a level 2 trauma following ground-level fall.  She lives with her son.  Patient noted right-sided numbness involving her right face, arm and leg with left-sided weakness before fall.  Suspected stroke on admission but MRI of the brain and cervical spine did not show any acute findings.  She was admitted for further workup, it was significant for UTI.   Subjective:    Katelyn Jackson today with no significant events overnight, no nausea, no vomiting, she reports good night sleep.  .     Assessment  & Plan :   Fall/strokelike symptoms:  - Fell at home.  Developed right-sided numbness/weakness - multiple falls over the last 1 mth. Negative MRI of the brain, cervical spine did not show any acute findings but showed mild C5-6 spinal canal stenosis,moderate bilateral C3-4 and C4-5 neural foraminal stenosis.  Her skeletal survey did not show any acute findings.  PT/OT consulted. -Right CT head with no acute finding as well -MRI lumbar spine is  pending - Patient has severe weakness and deconditioning at baseline.  Recent hospitalization from  07/10 to 05/24/23 for mechanical fall with left patellar and left distal clavicle fracture.  Recently had surgery of the right shoulder.  Cannot move right shoulder, sided range of motion and strength improving, uses wheelchair at home, continue PT OT, may require SNF. -Workup negative for acute CVA, as well as no significant findings on workup for TIA,  an excessive volume of blood received in culture bottles   Culture   Final    NO GROWTH 4 DAYS Performed at [facility], [address], [place], [place] [address]    Report Status PENDING  Incomplete  Blood Culture (routine x 2)     Status: Abnormal   Collection Time: 08/20/23  9:21 PM   Specimen: BLOOD  Result Value Ref Range Status   Specimen Description BLOOD LEFT ANTECUBITAL  Final   Special Requests   Final    BOTTLES DRAWN AEROBIC ONLY Blood Culture results may not be optimal due to an inadequate volume of blood received in culture bottles   Culture  Setup Time   Final    GRAM POSITIVE RODS AEROBIC BOTTLE ONLY CRITICAL RESULT CALLED TO, READ BACK BY AND VERIFIED WITH: J WYLAND,[email or web address]  08/22/23 MK    Culture (A)  Final    CORYNEBACTERIUM, GROUP JK Standardized susceptibility testing for this organism is not available. Performed at [facility], [address], [place], [place] [address]    Report Status 08/24/2023 FINAL  Final  Urine Culture     Status: Abnormal   Collection Time: 08/20/23 11:05 PM   Specimen: In/Out Cath Urine  Result Value Ref Range Status   Specimen Description IN/OUT CATH URINE  Final   Special Requests ADDED 2305  Final   Culture (A)  Final    >=100,000 COLONIES/mL KLEBSIELLA PNEUMONIAE Confirmed Extended Spectrum Beta-Lactamase Producer (ESBL).  In bloodstream infections from ESBL organisms, carbapenems are preferred over piperacillin/tazobactam. They are shown to have a lower risk of  mortality. MULTI-DRUG RESISTANT ORGANISM CRITICAL RESULT CALLED TO, READ BACK BY AND VERIFIED WITH: RN DKelby Fam  [id number] [phone] FCP Performed at [facility], [address] [place], [place] [address]    Report Status 08/24/2023 FINAL  Final   Organism ID, Bacteria KLEBSIELLA PNEUMONIAE (A)  Final      Susceptibility   Klebsiella pneumoniae - MIC*    AMPICILLIN >=32 RESISTANT Resistant     CEFAZOLIN >=64 RESISTANT Resistant     CEFEPIME >=32 RESISTANT Resistant     CEFTRIAXONE >=64 RESISTANT Resistant     CIPROFLOXACIN 2 RESISTANT Resistant     GENTAMICIN >=16 RESISTANT Resistant     IMIPENEM <=0.25 SENSITIVE Sensitive     NITROFURANTOIN 64 INTERMEDIATE Intermediate     TRIMETH/SULFA >=320 RESISTANT Resistant     AMPICILLIN/SULBACTAM >=32 RESISTANT Resistant     PIP/TAZO 16 SENSITIVE Sensitive ug/mL    * >=100,000 COLONIES/mL KLEBSIELLA PNEUMONIAE    Radiology Reports VAS US CAROTID  Result Date: 08/23/2023 Carotid Arterial Duplex Study Patient Name:  Katelyn Jackson  Date of Exam:   08/22/2023 Medical Rec #: [id number]          Accession #:    [id number] Date of Birth: [birth year]          Patient Gender: F Patient Age:   77 years Exam Location:  [facility] Procedure:      VAS US CAROTID Referring Phys: Bess Harvest [facility] --------------------------------------------------------------------------------  Indications:       TIA. Risk Factors:      Hypertension, hyperlipidemia, no history of smoking. Other Factors:     Afib, PM. Limitations        Today's exam was limited due to the body habitus of the                    patient and tortuous vessels. Comparison Study:  PROGRESS NOTE                                                                                                                                                                                                             Patient Demographics:    Katelyn Jackson, is a 77 y.o. female, DOB - 22-Oct-1946, [id number]  Outpatient Primary MD for the patient is Jackson, Katelyn Quint, MD    LOS - 4  Admit date - 08/20/2023    Chief Complaint  Patient presents with   Fall       Brief Narrative (HPI from H&P)     51 Russian-speaking female with history of permanent A-fib on anticoagulation, status post pacemaker, decompensated liver cirrhosis, CKD stage 2 hypertension, hyperlipidemia, hypothyroidism, chronic lymphedema, history of breast cancer status post radiation therapy, recent trauma with fracture of left upper and lower extremities, wheelchair dependent who was brought from home after a level 2 trauma following ground-level fall.  She lives with her son.  Patient noted right-sided numbness involving her right face, arm and leg with left-sided weakness before fall.  Suspected stroke on admission but MRI of the brain and cervical spine did not show any acute findings.  She was admitted for further workup, it was significant for UTI.   Subjective:    Katelyn Jackson today with no significant events overnight, no nausea, no vomiting, she reports good night sleep.  .     Assessment  & Plan :   Fall/strokelike symptoms:  - Fell at home.  Developed right-sided numbness/weakness - multiple falls over the last 1 mth. Negative MRI of the brain, cervical spine did not show any acute findings but showed mild C5-6 spinal canal stenosis,moderate bilateral C3-4 and C4-5 neural foraminal stenosis.  Her skeletal survey did not show any acute findings.  PT/OT consulted. -Right CT head with no acute finding as well -MRI lumbar spine is  pending - Patient has severe weakness and deconditioning at baseline.  Recent hospitalization from  07/10 to 05/24/23 for mechanical fall with left patellar and left distal clavicle fracture.  Recently had surgery of the right shoulder.  Cannot move right shoulder, sided range of motion and strength improving, uses wheelchair at home, continue PT OT, may require SNF. -Workup negative for acute CVA, as well as no significant findings on workup for TIA,  PROGRESS NOTE                                                                                                                                                                                                             Patient Demographics:    Katelyn Jackson, is a 77 y.o. female, DOB - 22-Oct-1946, [id number]  Outpatient Primary MD for the patient is Jackson, Katelyn Quint, MD    LOS - 4  Admit date - 08/20/2023    Chief Complaint  Patient presents with   Fall       Brief Narrative (HPI from H&P)     51 Russian-speaking female with history of permanent A-fib on anticoagulation, status post pacemaker, decompensated liver cirrhosis, CKD stage 2 hypertension, hyperlipidemia, hypothyroidism, chronic lymphedema, history of breast cancer status post radiation therapy, recent trauma with fracture of left upper and lower extremities, wheelchair dependent who was brought from home after a level 2 trauma following ground-level fall.  She lives with her son.  Patient noted right-sided numbness involving her right face, arm and leg with left-sided weakness before fall.  Suspected stroke on admission but MRI of the brain and cervical spine did not show any acute findings.  She was admitted for further workup, it was significant for UTI.   Subjective:    Katelyn Jackson today with no significant events overnight, no nausea, no vomiting, she reports good night sleep.  .     Assessment  & Plan :   Fall/strokelike symptoms:  - Fell at home.  Developed right-sided numbness/weakness - multiple falls over the last 1 mth. Negative MRI of the brain, cervical spine did not show any acute findings but showed mild C5-6 spinal canal stenosis,moderate bilateral C3-4 and C4-5 neural foraminal stenosis.  Her skeletal survey did not show any acute findings.  PT/OT consulted. -Right CT head with no acute finding as well -MRI lumbar spine is  pending - Patient has severe weakness and deconditioning at baseline.  Recent hospitalization from  07/10 to 05/24/23 for mechanical fall with left patellar and left distal clavicle fracture.  Recently had surgery of the right shoulder.  Cannot move right shoulder, sided range of motion and strength improving, uses wheelchair at home, continue PT OT, may require SNF. -Workup negative for acute CVA, as well as no significant findings on workup for TIA,  PROGRESS NOTE                                                                                                                                                                                                             Patient Demographics:    Katelyn Jackson, is a 77 y.o. female, DOB - 22-Oct-1946, [id number]  Outpatient Primary MD for the patient is Jackson, Katelyn Quint, MD    LOS - 4  Admit date - 08/20/2023    Chief Complaint  Patient presents with   Fall       Brief Narrative (HPI from H&P)     51 Russian-speaking female with history of permanent A-fib on anticoagulation, status post pacemaker, decompensated liver cirrhosis, CKD stage 2 hypertension, hyperlipidemia, hypothyroidism, chronic lymphedema, history of breast cancer status post radiation therapy, recent trauma with fracture of left upper and lower extremities, wheelchair dependent who was brought from home after a level 2 trauma following ground-level fall.  She lives with her son.  Patient noted right-sided numbness involving her right face, arm and leg with left-sided weakness before fall.  Suspected stroke on admission but MRI of the brain and cervical spine did not show any acute findings.  She was admitted for further workup, it was significant for UTI.   Subjective:    Katelyn Jackson today with no significant events overnight, no nausea, no vomiting, she reports good night sleep.  .     Assessment  & Plan :   Fall/strokelike symptoms:  - Fell at home.  Developed right-sided numbness/weakness - multiple falls over the last 1 mth. Negative MRI of the brain, cervical spine did not show any acute findings but showed mild C5-6 spinal canal stenosis,moderate bilateral C3-4 and C4-5 neural foraminal stenosis.  Her skeletal survey did not show any acute findings.  PT/OT consulted. -Right CT head with no acute finding as well -MRI lumbar spine is  pending - Patient has severe weakness and deconditioning at baseline.  Recent hospitalization from  07/10 to 05/24/23 for mechanical fall with left patellar and left distal clavicle fracture.  Recently had surgery of the right shoulder.  Cannot move right shoulder, sided range of motion and strength improving, uses wheelchair at home, continue PT OT, may require SNF. -Workup negative for acute CVA, as well as no significant findings on workup for TIA,  PSV cm/sEDV cm/sDescribe        Arm Pressure (mmHG) +----------+--------+--------+----------------+-------------------+ Subclavian118             Multiphasic, WNL                    +----------+--------+--------+----------------+-------------------+ +---------+--------+--+--------+--+---------+ VertebralPSV cm/s60EDV cm/s14Antegrade +---------+--------+--+--------+--+---------+   Summary: Right Carotid: The extracranial vessels were near-normal with only minimal wall                thickening or plaque. Left Carotid: The extracranial vessels were near-normal with only minimal wall               thickening or plaque. Vertebrals:  Bilateral vertebral arteries demonstrate antegrade flow. Subclavians: Normal flow hemodynamics were seen in bilateral subclavian              arteries. *See table(s) above for measurements and observations.  Electronically signed by Delia Heady MD on 08/23/2023 at 6:06:50 PM.    Final    ECHOCARDIOGRAM COMPLETE  Result Date: 08/23/2023    ECHOCARDIOGRAM REPORT   Patient Name:   NATTALY YEBRA Date of Exam: 08/23/2023 Medical Rec #:  [id number]         Height:       61.0 in Accession #:    [id number]        Weight:       202.4 lb Date of Birth:  January 28, 1946         BSA:          1.898 m Patient Age:    77 years          BP:           133/63 mmHg Patient Gender: F                 HR:           66 bpm. Exam Location:  Inpatient Procedure: 2D Echo, Cardiac Doppler and Color Doppler Indications:    CHF I50.31   History:        Patient has prior history of Echocardiogram examinations, most                 recent 03/19/2021. CHF, Pacemaker, Pulmonary HTN,                 Arrythmias:Atrial Fibrillation; Risk Factors:Hypertension,                 Dyslipidemia and Non-Smoker.  Sonographer:    Dondra Prader RVT RCS Referring Phys: [id number] [facility] IMPRESSIONS  1. Left ventricular ejection fraction, by estimation, is 40 to 45%. The left ventricle has mildly decreased function. The left ventricle demonstrates global hypokinesis. The left ventricular internal cavity size was mildly to moderately dilated. Left ventricular diastolic function could not be evaluated.  2. Right ventricular systolic function is moderately reduced. The right ventricular size is moderately enlarged. There is mildly elevated pulmonary artery systolic pressure. The estimated right ventricular systolic pressure is 36.7 mmHg.  3. Left atrial size was severely dilated.  4. Appears to be a PFO with L to R shunting. Evidence of atrial level shunting detected by color flow Doppler.  5. Right atrial size was severely dilated.  6. The mitral valve is abnormal. Severe mitral valve regurgitation. No evidence of mitral stenosis.  7. Tricuspid valve regurgitation is mild to moderate.  8. The aortic valve is tricuspid. Aortic valve regurgitation is trivial. No aortic stenosis is present.  9. The inferior vena cava  PROGRESS NOTE                                                                                                                                                                                                             Patient Demographics:    Katelyn Jackson, is a 77 y.o. female, DOB - 22-Oct-1946, [id number]  Outpatient Primary MD for the patient is Jackson, Katelyn Quint, MD    LOS - 4  Admit date - 08/20/2023    Chief Complaint  Patient presents with   Fall       Brief Narrative (HPI from H&P)     51 Russian-speaking female with history of permanent A-fib on anticoagulation, status post pacemaker, decompensated liver cirrhosis, CKD stage 2 hypertension, hyperlipidemia, hypothyroidism, chronic lymphedema, history of breast cancer status post radiation therapy, recent trauma with fracture of left upper and lower extremities, wheelchair dependent who was brought from home after a level 2 trauma following ground-level fall.  She lives with her son.  Patient noted right-sided numbness involving her right face, arm and leg with left-sided weakness before fall.  Suspected stroke on admission but MRI of the brain and cervical spine did not show any acute findings.  She was admitted for further workup, it was significant for UTI.   Subjective:    Katelyn Jackson today with no significant events overnight, no nausea, no vomiting, she reports good night sleep.  .     Assessment  & Plan :   Fall/strokelike symptoms:  - Fell at home.  Developed right-sided numbness/weakness - multiple falls over the last 1 mth. Negative MRI of the brain, cervical spine did not show any acute findings but showed mild C5-6 spinal canal stenosis,moderate bilateral C3-4 and C4-5 neural foraminal stenosis.  Her skeletal survey did not show any acute findings.  PT/OT consulted. -Right CT head with no acute finding as well -MRI lumbar spine is  pending - Patient has severe weakness and deconditioning at baseline.  Recent hospitalization from  07/10 to 05/24/23 for mechanical fall with left patellar and left distal clavicle fracture.  Recently had surgery of the right shoulder.  Cannot move right shoulder, sided range of motion and strength improving, uses wheelchair at home, continue PT OT, may require SNF. -Workup negative for acute CVA, as well as no significant findings on workup for TIA,  PROGRESS NOTE                                                                                                                                                                                                             Patient Demographics:    Katelyn Jackson, is a 77 y.o. female, DOB - 22-Oct-1946, [id number]  Outpatient Primary MD for the patient is Jackson, Katelyn Quint, MD    LOS - 4  Admit date - 08/20/2023    Chief Complaint  Patient presents with   Fall       Brief Narrative (HPI from H&P)     51 Russian-speaking female with history of permanent A-fib on anticoagulation, status post pacemaker, decompensated liver cirrhosis, CKD stage 2 hypertension, hyperlipidemia, hypothyroidism, chronic lymphedema, history of breast cancer status post radiation therapy, recent trauma with fracture of left upper and lower extremities, wheelchair dependent who was brought from home after a level 2 trauma following ground-level fall.  She lives with her son.  Patient noted right-sided numbness involving her right face, arm and leg with left-sided weakness before fall.  Suspected stroke on admission but MRI of the brain and cervical spine did not show any acute findings.  She was admitted for further workup, it was significant for UTI.   Subjective:    Katelyn Jackson today with no significant events overnight, no nausea, no vomiting, she reports good night sleep.  .     Assessment  & Plan :   Fall/strokelike symptoms:  - Fell at home.  Developed right-sided numbness/weakness - multiple falls over the last 1 mth. Negative MRI of the brain, cervical spine did not show any acute findings but showed mild C5-6 spinal canal stenosis,moderate bilateral C3-4 and C4-5 neural foraminal stenosis.  Her skeletal survey did not show any acute findings.  PT/OT consulted. -Right CT head with no acute finding as well -MRI lumbar spine is  pending - Patient has severe weakness and deconditioning at baseline.  Recent hospitalization from  07/10 to 05/24/23 for mechanical fall with left patellar and left distal clavicle fracture.  Recently had surgery of the right shoulder.  Cannot move right shoulder, sided range of motion and strength improving, uses wheelchair at home, continue PT OT, may require SNF. -Workup negative for acute CVA, as well as no significant findings on workup for TIA,  an excessive volume of blood received in culture bottles   Culture   Final    NO GROWTH 4 DAYS Performed at [facility], [address], [place], [place] [address]    Report Status PENDING  Incomplete  Blood Culture (routine x 2)     Status: Abnormal   Collection Time: 08/20/23  9:21 PM   Specimen: BLOOD  Result Value Ref Range Status   Specimen Description BLOOD LEFT ANTECUBITAL  Final   Special Requests   Final    BOTTLES DRAWN AEROBIC ONLY Blood Culture results may not be optimal due to an inadequate volume of blood received in culture bottles   Culture  Setup Time   Final    GRAM POSITIVE RODS AEROBIC BOTTLE ONLY CRITICAL RESULT CALLED TO, READ BACK BY AND VERIFIED WITH: J WYLAND,[email or web address]  08/22/23 MK    Culture (A)  Final    CORYNEBACTERIUM, GROUP JK Standardized susceptibility testing for this organism is not available. Performed at [facility], [address], [place], [place] [address]    Report Status 08/24/2023 FINAL  Final  Urine Culture     Status: Abnormal   Collection Time: 08/20/23 11:05 PM   Specimen: In/Out Cath Urine  Result Value Ref Range Status   Specimen Description IN/OUT CATH URINE  Final   Special Requests ADDED 2305  Final   Culture (A)  Final    >=100,000 COLONIES/mL KLEBSIELLA PNEUMONIAE Confirmed Extended Spectrum Beta-Lactamase Producer (ESBL).  In bloodstream infections from ESBL organisms, carbapenems are preferred over piperacillin/tazobactam. They are shown to have a lower risk of  mortality. MULTI-DRUG RESISTANT ORGANISM CRITICAL RESULT CALLED TO, READ BACK BY AND VERIFIED WITH: RN DKelby Fam  [id number] [phone] FCP Performed at [facility], [address] [place], [place] [address]    Report Status 08/24/2023 FINAL  Final   Organism ID, Bacteria KLEBSIELLA PNEUMONIAE (A)  Final      Susceptibility   Klebsiella pneumoniae - MIC*    AMPICILLIN >=32 RESISTANT Resistant     CEFAZOLIN >=64 RESISTANT Resistant     CEFEPIME >=32 RESISTANT Resistant     CEFTRIAXONE >=64 RESISTANT Resistant     CIPROFLOXACIN 2 RESISTANT Resistant     GENTAMICIN >=16 RESISTANT Resistant     IMIPENEM <=0.25 SENSITIVE Sensitive     NITROFURANTOIN 64 INTERMEDIATE Intermediate     TRIMETH/SULFA >=320 RESISTANT Resistant     AMPICILLIN/SULBACTAM >=32 RESISTANT Resistant     PIP/TAZO 16 SENSITIVE Sensitive ug/mL    * >=100,000 COLONIES/mL KLEBSIELLA PNEUMONIAE    Radiology Reports VAS US CAROTID  Result Date: 08/23/2023 Carotid Arterial Duplex Study Patient Name:  Katelyn Jackson  Date of Exam:   08/22/2023 Medical Rec #: [id number]          Accession #:    [id number] Date of Birth: [birth year]          Patient Gender: F Patient Age:   77 years Exam Location:  [facility] Procedure:      VAS US CAROTID Referring Phys: Bess Harvest [facility] --------------------------------------------------------------------------------  Indications:       TIA. Risk Factors:      Hypertension, hyperlipidemia, no history of smoking. Other Factors:     Afib, PM. Limitations        Today's exam was limited due to the body habitus of the                    patient and tortuous vessels. Comparison Study:  an excessive volume of blood received in culture bottles   Culture   Final    NO GROWTH 4 DAYS Performed at [facility], [address], [place], [place] [address]    Report Status PENDING  Incomplete  Blood Culture (routine x 2)     Status: Abnormal   Collection Time: 08/20/23  9:21 PM   Specimen: BLOOD  Result Value Ref Range Status   Specimen Description BLOOD LEFT ANTECUBITAL  Final   Special Requests   Final    BOTTLES DRAWN AEROBIC ONLY Blood Culture results may not be optimal due to an inadequate volume of blood received in culture bottles   Culture  Setup Time   Final    GRAM POSITIVE RODS AEROBIC BOTTLE ONLY CRITICAL RESULT CALLED TO, READ BACK BY AND VERIFIED WITH: J WYLAND,[email or web address]  08/22/23 MK    Culture (A)  Final    CORYNEBACTERIUM, GROUP JK Standardized susceptibility testing for this organism is not available. Performed at [facility], [address], [place], [place] [address]    Report Status 08/24/2023 FINAL  Final  Urine Culture     Status: Abnormal   Collection Time: 08/20/23 11:05 PM   Specimen: In/Out Cath Urine  Result Value Ref Range Status   Specimen Description IN/OUT CATH URINE  Final   Special Requests ADDED 2305  Final   Culture (A)  Final    >=100,000 COLONIES/mL KLEBSIELLA PNEUMONIAE Confirmed Extended Spectrum Beta-Lactamase Producer (ESBL).  In bloodstream infections from ESBL organisms, carbapenems are preferred over piperacillin/tazobactam. They are shown to have a lower risk of  mortality. MULTI-DRUG RESISTANT ORGANISM CRITICAL RESULT CALLED TO, READ BACK BY AND VERIFIED WITH: RN DKelby Fam  [phone] FCP Performed at [facility], [address] [place], [place] [address]    Report Status 08/24/2023 FINAL  Final   Organism ID, Bacteria KLEBSIELLA PNEUMONIAE (A)  Final      Susceptibility   Klebsiella pneumoniae - MIC*    AMPICILLIN >=32 RESISTANT Resistant     CEFAZOLIN >=64 RESISTANT Resistant     CEFEPIME >=32 RESISTANT Resistant     CEFTRIAXONE >=64 RESISTANT Resistant     CIPROFLOXACIN 2 RESISTANT Resistant     GENTAMICIN >=16 RESISTANT Resistant     IMIPENEM <=0.25 SENSITIVE Sensitive     NITROFURANTOIN 64 INTERMEDIATE Intermediate     TRIMETH/SULFA >=320 RESISTANT Resistant     AMPICILLIN/SULBACTAM >=32 RESISTANT Resistant     PIP/TAZO 16 SENSITIVE Sensitive ug/mL    * >=100,000 COLONIES/mL KLEBSIELLA PNEUMONIAE    Radiology Reports VAS US CAROTID  Result Date: 08/23/2023 Carotid Arterial Duplex Study Patient Name:  Katelyn Jackson  Date of Exam:   08/22/2023 Medical Rec #: [id number]          Accession #:    [id number] Date of Birth: [birth year]          Patient Gender: F Patient Age:   77 years Exam Location:  [facility] Procedure:      VAS US CAROTID Referring Phys: Bess Harvest [facility] --------------------------------------------------------------------------------  Indications:       TIA. Risk Factors:      Hypertension, hyperlipidemia, no history of smoking. Other Factors:     Afib, PM. Limitations        Today's exam was limited due to the body habitus of the                    patient and tortuous vessels. Comparison Study:  an excessive volume of blood received in culture bottles   Culture   Final    NO GROWTH 4 DAYS Performed at [facility], [address], [place], [place] [address]    Report Status PENDING  Incomplete  Blood Culture (routine x 2)     Status: Abnormal   Collection Time: 08/20/23  9:21 PM   Specimen: BLOOD  Result Value Ref Range Status   Specimen Description BLOOD LEFT ANTECUBITAL  Final   Special Requests   Final    BOTTLES DRAWN AEROBIC ONLY Blood Culture results may not be optimal due to an inadequate volume of blood received in culture bottles   Culture  Setup Time   Final    GRAM POSITIVE RODS AEROBIC BOTTLE ONLY CRITICAL RESULT CALLED TO, READ BACK BY AND VERIFIED WITH: J [email or web address]  08/22/23 MK    Culture (A)  Final    CORYNEBACTERIUM, GROUP JK Standardized susceptibility testing for this organism is not available. Performed at [facility], [address], [place], [place] [address]    Report Status 08/24/2023 FINAL  Final  Urine Culture     Status: Abnormal   Collection Time: 08/20/23 11:05 PM   Specimen: In/Out Cath Urine  Result Value Ref Range Status   Specimen Description IN/OUT CATH URINE  Final   Special Requests ADDED 2305  Final   Culture (A)  Final    >=100,000 COLONIES/mL KLEBSIELLA PNEUMONIAE Confirmed Extended Spectrum Beta-Lactamase Producer (ESBL).  In bloodstream infections from ESBL organisms, carbapenems are preferred over piperacillin/tazobactam. They are shown to have a lower risk of  mortality. MULTI-DRUG RESISTANT ORGANISM CRITICAL RESULT CALLED TO, READ BACK BY AND VERIFIED WITH: RN DKelby Fam  [phone] FCP Performed at [facility], [address] [place], [place] [address]    Report Status 08/24/2023 FINAL  Final   Organism ID, Bacteria KLEBSIELLA PNEUMONIAE (A)  Final      Susceptibility   Klebsiella pneumoniae - MIC*    AMPICILLIN >=32 RESISTANT Resistant     CEFAZOLIN >=64 RESISTANT Resistant     CEFEPIME >=32 RESISTANT Resistant     CEFTRIAXONE >=64 RESISTANT Resistant     CIPROFLOXACIN 2 RESISTANT Resistant     GENTAMICIN >=16 RESISTANT Resistant     IMIPENEM <=0.25 SENSITIVE Sensitive     NITROFURANTOIN 64 INTERMEDIATE Intermediate     TRIMETH/SULFA >=320 RESISTANT Resistant     AMPICILLIN/SULBACTAM >=32 RESISTANT Resistant     PIP/TAZO 16 SENSITIVE Sensitive ug/mL    * >=100,000 COLONIES/mL KLEBSIELLA PNEUMONIAE    Radiology Reports VAS US CAROTID  Result Date: 08/23/2023 Carotid Arterial Duplex Study Patient Name:  Katelyn Jackson  Date of Exam:   08/22/2023 Medical Rec #: [id number]          Accession #:    [id number] Date of Birth: [birth year]          Patient Gender: F Patient Age:   77 years Exam Location:  [facility] Procedure:      VAS US CAROTID Referring Phys: Bess Harvest [facility] --------------------------------------------------------------------------------  Indications:       TIA. Risk Factors:      Hypertension, hyperlipidemia, no history of smoking. Other Factors:     Afib, PM. Limitations        Today's exam was limited due to the body habitus of the                    patient and tortuous vessels. Comparison Study:  PROGRESS NOTE                                                                                                                                                                                                             Patient Demographics:    Katelyn Jackson, is a 77 y.o. female, DOB - 22-Oct-1946, WUJ:[id number]  Outpatient Primary MD for the patient is Jackson, Katelyn Quint, MD    LOS - 4  Admit date - 08/20/2023    Chief Complaint  Patient presents with   Fall       Brief Narrative (HPI from H&P)     51 Russian-speaking female with history of permanent A-fib on anticoagulation, status post pacemaker, decompensated liver cirrhosis, CKD stage 2 hypertension, hyperlipidemia, hypothyroidism, chronic lymphedema, history of breast cancer status post radiation therapy, recent trauma with fracture of left upper and lower extremities, wheelchair dependent who was brought from home after a level 2 trauma following ground-level fall.  She lives with her son.  Patient noted right-sided numbness involving her right face, arm and leg with left-sided weakness before fall.  Suspected stroke on admission but MRI of the brain and cervical spine did not show any acute findings.  She was admitted for further workup, it was significant for UTI.   Subjective:    Katelyn Jackson today with no significant events overnight, no nausea, no vomiting, she reports good night sleep.  .     Assessment  & Plan :   Fall/strokelike symptoms:  - Fell at home.  Developed right-sided numbness/weakness - multiple falls over the last 1 mth. Negative MRI of the brain, cervical spine did not show any acute findings but showed mild C5-6 spinal canal stenosis,moderate bilateral C3-4 and C4-5 neural foraminal stenosis.  Her skeletal survey did not show any acute findings.  PT/OT consulted. -Right CT head with no acute finding as well -MRI lumbar spine is  pending - Patient has severe weakness and deconditioning at baseline.  Recent hospitalization from  07/10 to 05/24/23 for mechanical fall with left patellar and left distal clavicle fracture.  Recently had surgery of the right shoulder.  Cannot move right shoulder, sided range of motion and strength improving, uses wheelchair at home, continue PT OT, may require SNF. -Workup negative for acute CVA, as well as no significant findings on workup for TIA,

## 2023-08-25 DIAGNOSIS — D649 Anemia, unspecified: Secondary | ICD-10-CM | POA: Diagnosis not present

## 2023-08-25 DIAGNOSIS — B9629 Other Escherichia coli [E. coli] as the cause of diseases classified elsewhere: Secondary | ICD-10-CM | POA: Diagnosis not present

## 2023-08-25 DIAGNOSIS — Z1612 Extended spectrum beta lactamase (ESBL) resistance: Secondary | ICD-10-CM | POA: Diagnosis not present

## 2023-08-25 DIAGNOSIS — R296 Repeated falls: Secondary | ICD-10-CM | POA: Diagnosis not present

## 2023-08-25 LAB — CBC
HCT: 24 % — ABNORMAL LOW (ref 36.0–46.0)
Hemoglobin: 7.3 g/dL — ABNORMAL LOW (ref 12.0–15.0)
MCH: 30.2 pg (ref 26.0–34.0)
MCHC: 30.4 g/dL (ref 30.0–36.0)
MCV: 99.2 fL (ref 80.0–100.0)
Platelets: 147 10*3/uL — ABNORMAL LOW (ref 150–400)
RBC: 2.42 MIL/uL — ABNORMAL LOW (ref 3.87–5.11)
RDW: 14 % (ref 11.5–15.5)
WBC: 4 10*3/uL (ref 4.0–10.5)
nRBC: 0 % (ref 0.0–0.2)

## 2023-08-25 LAB — BASIC METABOLIC PANEL
Anion gap: 6 (ref 5–15)
BUN: 18 mg/dL (ref 8–23)
CO2: 24 mmol/L (ref 22–32)
Calcium: 9.2 mg/dL (ref 8.9–10.3)
Chloride: 109 mmol/L (ref 98–111)
Creatinine, Ser: 1.29 mg/dL — ABNORMAL HIGH (ref 0.44–1.00)
GFR, Estimated: 43 mL/min — ABNORMAL LOW (ref >60)
Glucose, Bld: 97 mg/dL (ref 70–99)
Potassium: 3.8 mmol/L (ref 3.5–5.1)
Sodium: 139 mmol/L (ref 135–145)

## 2023-08-25 LAB — CULTURE, BLOOD (ROUTINE X 2): Culture: NO GROWTH

## 2023-08-25 MED ORDER — MAGNESIUM CITRATE PO SOLN
0.5000 | Freq: Once | ORAL | Status: DC | PRN
  Filled 2023-08-25: qty 296

## 2023-08-25 MED ORDER — MAGNESIUM CITRATE PO SOLN
1.0000 | Freq: Once | ORAL | Status: DC
  Filled 2023-08-25: qty 296

## 2023-08-25 NOTE — TOC Progression Note (Signed)
Transition of Care [facility]) - Progression Note    Patient Details  Name: Katelyn Jackson MRN: [id number] Date of Birth: [birth year]  Transition of [facility] CM/SW Contact  Mearl Latin, LCSW Phone Number: 08/25/2023, 9:45 AM  Clinical Narrative:    CSW spoke with patient's son to obtain discharge decision. He stated he would like to move forward with long term care and signing patient's check over to the facility (which he believes was already happening with her check as it stopped coming to the house). [name] will contact son today to arrange.    Expected Discharge Plan: Skilled Nursing Facility Barriers to Discharge: Continued Medical Work up, English as a second language teacher  Expected Discharge Plan and Services In-house Referral: Clinical Social Work     Living arrangements for the past 2 months: Single Family Home, Skilled Nursing Facility                                       Social Determinants of Health (SDOH) Interventions SDOH Screenings   Food Insecurity: No Food Insecurity (08/21/2023)  Housing: Patient Unable To Answer (08/21/2023)  Transportation Needs: No Transportation Needs (08/21/2023)  Utilities: Not At Risk (08/21/2023)  Alcohol Screen: Low Risk  (06/15/2022)  Depression (PHQ2-9): Low Risk  (04/17/2023)  Financial Resource Strain: Low Risk  (06/15/2022)  Physical Activity: Inactive (06/15/2022)  Social Connections: Socially Isolated (06/15/2022)  Stress: No Stress Concern Present (06/15/2022)  Tobacco Use: Low Risk  (08/21/2023)    Readmission Risk Interventions    07/06/2021   11:49 AM 03/19/2021    3:03 PM  Readmission Risk Prevention Plan  Transportation Screening Complete Complete  HRI or Home Care Consult  Complete  Social Work Consult for Recovery Care Planning/Counseling  Complete  Palliative Care Screening  Not Applicable  Medication Review Oceanographer)  Complete  HRI or Home Care Consult Complete   SW Recovery Care/Counseling Consult  Complete   Palliative Care Screening Not Applicable   Skilled Nursing Facility Complete

## 2023-08-25 NOTE — Progress Notes (Signed)
spinal canal and lateral recesses are sufficiently patent. There is chronic mild to moderate right and severe left foraminal narrowing which has slightly progressed. IMPRESSION: 1. No acute findings or clear explanation for the patient's symptoms. No evidence of metastatic disease within the lumbar spine. 2. Multilevel spondylosis has progressed from remote MRI from 2018. Associated discal T2 hyperintensity at the lower 3 levels appears degenerative. No evidence of discitis/osteomyelitis. 3. At L3-4, there is mild to moderate multifactorial  spinal stenosis with increasing mild to moderate left greater than right lateral recess and foraminal narrowing. 4. Mild multifactorial spinal stenosis at L4-5 with mildly increased narrowing of the left lateral recess. 5. Slightly progressive chronic mild to moderate right and severe left foraminal narrowing at L5-S1. Electronically Signed   By: Carey Bullocks M.D.   On: 08/24/2023 13:32   VAS US CAROTID  Result Date: 08/23/2023 Carotid Arterial Duplex Study Patient Name:  Katelyn Jackson  Date of Exam:   08/22/2023 Medical Rec #: [id number]          Accession #:    [id number] Date of Birth: 03-27-1946          Patient Gender: F Patient Age:   77 years Exam Location:  [facility] Procedure:      VAS US CAROTID Referring Phys: Bess Harvest [facility] --------------------------------------------------------------------------------  Indications:       TIA. Risk Factors:      Hypertension, hyperlipidemia, no history of smoking. Other Factors:     Afib, PM. Limitations        Today's exam was limited due to the body habitus of the                    patient and tortuous vessels. Comparison Study:  No previous exams Performing Technologist: Jody Hill RVT, RDMS  Examination Guidelines: A complete evaluation includes B-mode imaging, spectral Doppler, color Doppler, and power Doppler as needed of all accessible portions of each vessel. Bilateral testing is considered an integral part of a complete examination. Limited examinations for reoccurring indications may be performed as noted.  Right Carotid Findings: +----------+--------+--------+--------+------------------+--------+           PSV cm/sEDV cm/sStenosisPlaque DescriptionComments +----------+--------+--------+--------+------------------+--------+ CCA Prox  76      12                                tortuous +----------+--------+--------+--------+------------------+--------+ CCA Distal89      15                                          +----------+--------+--------+--------+------------------+--------+ ICA Prox  85      24                                         +----------+--------+--------+--------+------------------+--------+ ICA Distal79      25                                tortuous +----------+--------+--------+--------+------------------+--------+ ECA       66      10                                         +----------+--------+--------+--------+------------------+--------+ +----------+--------+-------+----------------+-------------------+  PSV cm/sEDV cmsDescribe        Arm Pressure (mmHG) +----------+--------+-------+----------------+-------------------+ Subclavian100            Multiphasic, WNL                    +----------+--------+-------+----------------+-------------------+ +---------+--------+--+--------+--+---------+ VertebralPSV cm/s58EDV cm/s14Antegrade +---------+--------+--+--------+--+---------+  Left Carotid Findings: +----------+--------+--------+--------+------------------+------------------+           PSV cm/sEDV cm/sStenosisPlaque DescriptionComments           +----------+--------+--------+--------+------------------+------------------+ CCA Prox  81      15                                                   +----------+--------+--------+--------+------------------+------------------+ CCA Distal66      17                                intimal thickening +----------+--------+--------+--------+------------------+------------------+ ICA Prox  65      16              calcific                             +----------+--------+--------+--------+------------------+------------------+ ICA Distal98      34              calcific          tortuous           +----------+--------+--------+--------+------------------+------------------+ +----------+--------+--------+----------------+-------------------+           PSV cm/sEDV cm/sDescribe        Arm  Pressure (mmHG) +----------+--------+--------+----------------+-------------------+ [id number]             Multiphasic, WNL                    +----------+--------+--------+----------------+-------------------+ +---------+--------+--+--------+--+---------+ VertebralPSV cm/s60EDV cm/s14Antegrade +---------+--------+--+--------+--+---------+   Summary: Right Carotid: The extracranial vessels were near-normal with only minimal wall                thickening or plaque. Left Carotid: The extracranial vessels were near-normal with only minimal wall               thickening or plaque. Vertebrals:  Bilateral vertebral arteries demonstrate antegrade flow. Subclavians: Normal flow hemodynamics were seen in bilateral subclavian              arteries. *See table(s) above for measurements and observations.  Electronically signed by Delia Heady MD on 08/23/2023 at 6:06:50 PM.    Final    ECHOCARDIOGRAM COMPLETE  Result Date: 08/23/2023    ECHOCARDIOGRAM REPORT   Patient Name:   Katelyn Jackson Date of Exam: 08/23/2023 Medical Rec #:  [id number]         Height:       61.0 in Accession #:    [id number]        Weight:       202.4 lb Date of Birth:  Mar 20, 1946         BSA:          1.898 m Patient Age:    77 years          BP:           133/63 mmHg Patient Gender: F  PSV cm/sEDV cmsDescribe        Arm Pressure (mmHG) +----------+--------+-------+----------------+-------------------+ Subclavian100            Multiphasic, WNL                    +----------+--------+-------+----------------+-------------------+ +---------+--------+--+--------+--+---------+ VertebralPSV cm/s58EDV cm/s14Antegrade +---------+--------+--+--------+--+---------+  Left Carotid Findings: +----------+--------+--------+--------+------------------+------------------+           PSV cm/sEDV cm/sStenosisPlaque DescriptionComments           +----------+--------+--------+--------+------------------+------------------+ CCA Prox  81      15                                                   +----------+--------+--------+--------+------------------+------------------+ CCA Distal66      17                                intimal thickening +----------+--------+--------+--------+------------------+------------------+ ICA Prox  65      16              calcific                             +----------+--------+--------+--------+------------------+------------------+ ICA Distal98      34              calcific          tortuous           +----------+--------+--------+--------+------------------+------------------+ +----------+--------+--------+----------------+-------------------+           PSV cm/sEDV cm/sDescribe        Arm  Pressure (mmHG) +----------+--------+--------+----------------+-------------------+ [id number]             Multiphasic, WNL                    +----------+--------+--------+----------------+-------------------+ +---------+--------+--+--------+--+---------+ VertebralPSV cm/s60EDV cm/s14Antegrade +---------+--------+--+--------+--+---------+   Summary: Right Carotid: The extracranial vessels were near-normal with only minimal wall                thickening or plaque. Left Carotid: The extracranial vessels were near-normal with only minimal wall               thickening or plaque. Vertebrals:  Bilateral vertebral arteries demonstrate antegrade flow. Subclavians: Normal flow hemodynamics were seen in bilateral subclavian              arteries. *See table(s) above for measurements and observations.  Electronically signed by Delia Heady MD on 08/23/2023 at 6:06:50 PM.    Final    ECHOCARDIOGRAM COMPLETE  Result Date: 08/23/2023    ECHOCARDIOGRAM REPORT   Patient Name:   Katelyn Jackson Date of Exam: 08/23/2023 Medical Rec #:  [id number]         Height:       61.0 in Accession #:    [id number]        Weight:       202.4 lb Date of Birth:  Mar 20, 1946         BSA:          1.898 m Patient Age:    77 years          BP:           133/63 mmHg Patient Gender: F  PROGRESS NOTE                                                                                                                                                                                                             Patient Demographics:    Katelyn Jackson, is a 77 y.o. female, DOB - 06-05-1946, WUJ:[id number]  Outpatient Primary MD for the patient is Plotnikov, Georgina Quint, MD    LOS - 5  Admit date - 08/20/2023    Chief Complaint  Patient presents with   Fall       Brief Narrative (HPI from H&P)     7 Russian-speaking female with history of permanent A-fib on anticoagulation, status post pacemaker, decompensated liver cirrhosis, CKD stage 2 hypertension, hyperlipidemia, hypothyroidism, chronic lymphedema, history of breast cancer status post radiation therapy, recent trauma with fracture of left upper and lower extremities, wheelchair dependent who was brought from home after a level 2 trauma following ground-level fall.  She lives with her son.  Patient noted right-sided numbness involving her right face, arm and leg with left-sided weakness before fall.  Suspected stroke on admission but MRI of the brain and cervical spine did not show any acute findings.  She was admitted for further workup, it was significant for UTI.   Subjective:    Katelyn Jackson today with no significant events overnight, no nausea, no vomiting, she reports good night sleep.  .     Assessment  & Plan :   Fall/strokelike symptoms:  - Fell at home.  Developed right-sided numbness/weakness - multiple falls over the last 1 mth. Negative MRI of the brain, cervical spine did not show any acute findings but showed mild C5-6 spinal canal stenosis,moderate bilateral C3-4 and C4-5 neural foraminal stenosis.  Her skeletal survey did not show any acute findings.  PT/OT consulted. -Right CT head with no acute finding as well -MRI lumbar spine difficult  for radiculopathy, but no significant findings to explain right side weakness - Patient has severe weakness and deconditioning at baseline.  Recent hospitalization from  07/10 to 05/24/23 for mechanical fall with left patellar and left distal clavicle fracture.  Recently had surgery of the right shoulder.  Cannot move right shoulder, sided range of motion and strength improving, uses wheelchair at home, continue PT OT, may require SNF. -Workup negative for acute CVA,  spinal canal and lateral recesses are sufficiently patent. There is chronic mild to moderate right and severe left foraminal narrowing which has slightly progressed. IMPRESSION: 1. No acute findings or clear explanation for the patient's symptoms. No evidence of metastatic disease within the lumbar spine. 2. Multilevel spondylosis has progressed from remote MRI from 2018. Associated discal T2 hyperintensity at the lower 3 levels appears degenerative. No evidence of discitis/osteomyelitis. 3. At L3-4, there is mild to moderate multifactorial  spinal stenosis with increasing mild to moderate left greater than right lateral recess and foraminal narrowing. 4. Mild multifactorial spinal stenosis at L4-5 with mildly increased narrowing of the left lateral recess. 5. Slightly progressive chronic mild to moderate right and severe left foraminal narrowing at L5-S1. Electronically Signed   By: Carey Bullocks M.D.   On: 08/24/2023 13:32   VAS US CAROTID  Result Date: 08/23/2023 Carotid Arterial Duplex Study Patient Name:  Katelyn Jackson  Date of Exam:   08/22/2023 Medical Rec #: [id number]          Accession #:    [id number] Date of Birth: 03-27-1946          Patient Gender: F Patient Age:   77 years Exam Location:  [facility] Procedure:      VAS US CAROTID Referring Phys: Bess Harvest [facility] --------------------------------------------------------------------------------  Indications:       TIA. Risk Factors:      Hypertension, hyperlipidemia, no history of smoking. Other Factors:     Afib, PM. Limitations        Today's exam was limited due to the body habitus of the                    patient and tortuous vessels. Comparison Study:  No previous exams Performing Technologist: Jody Hill RVT, RDMS  Examination Guidelines: A complete evaluation includes B-mode imaging, spectral Doppler, color Doppler, and power Doppler as needed of all accessible portions of each vessel. Bilateral testing is considered an integral part of a complete examination. Limited examinations for reoccurring indications may be performed as noted.  Right Carotid Findings: +----------+--------+--------+--------+------------------+--------+           PSV cm/sEDV cm/sStenosisPlaque DescriptionComments +----------+--------+--------+--------+------------------+--------+ CCA Prox  76      12                                tortuous +----------+--------+--------+--------+------------------+--------+ CCA Distal89      15                                          +----------+--------+--------+--------+------------------+--------+ ICA Prox  85      24                                         +----------+--------+--------+--------+------------------+--------+ ICA Distal79      25                                tortuous +----------+--------+--------+--------+------------------+--------+ ECA       66      10                                         +----------+--------+--------+--------+------------------+--------+ +----------+--------+-------+----------------+-------------------+  spinal canal and lateral recesses are sufficiently patent. There is chronic mild to moderate right and severe left foraminal narrowing which has slightly progressed. IMPRESSION: 1. No acute findings or clear explanation for the patient's symptoms. No evidence of metastatic disease within the lumbar spine. 2. Multilevel spondylosis has progressed from remote MRI from 2018. Associated discal T2 hyperintensity at the lower 3 levels appears degenerative. No evidence of discitis/osteomyelitis. 3. At L3-4, there is mild to moderate multifactorial  spinal stenosis with increasing mild to moderate left greater than right lateral recess and foraminal narrowing. 4. Mild multifactorial spinal stenosis at L4-5 with mildly increased narrowing of the left lateral recess. 5. Slightly progressive chronic mild to moderate right and severe left foraminal narrowing at L5-S1. Electronically Signed   By: Carey Bullocks M.D.   On: 08/24/2023 13:32   VAS US CAROTID  Result Date: 08/23/2023 Carotid Arterial Duplex Study Patient Name:  Katelyn Jackson  Date of Exam:   08/22/2023 Medical Rec #: [id number]          Accession #:    [id number] Date of Birth: 03-27-1946          Patient Gender: F Patient Age:   77 years Exam Location:  [facility] Procedure:      VAS US CAROTID Referring Phys: Bess Harvest [facility] --------------------------------------------------------------------------------  Indications:       TIA. Risk Factors:      Hypertension, hyperlipidemia, no history of smoking. Other Factors:     Afib, PM. Limitations        Today's exam was limited due to the body habitus of the                    patient and tortuous vessels. Comparison Study:  No previous exams Performing Technologist: Jody Hill RVT, RDMS  Examination Guidelines: A complete evaluation includes B-mode imaging, spectral Doppler, color Doppler, and power Doppler as needed of all accessible portions of each vessel. Bilateral testing is considered an integral part of a complete examination. Limited examinations for reoccurring indications may be performed as noted.  Right Carotid Findings: +----------+--------+--------+--------+------------------+--------+           PSV cm/sEDV cm/sStenosisPlaque DescriptionComments +----------+--------+--------+--------+------------------+--------+ CCA Prox  76      12                                tortuous +----------+--------+--------+--------+------------------+--------+ CCA Distal89      15                                          +----------+--------+--------+--------+------------------+--------+ ICA Prox  85      24                                         +----------+--------+--------+--------+------------------+--------+ ICA Distal79      25                                tortuous +----------+--------+--------+--------+------------------+--------+ ECA       66      10                                         +----------+--------+--------+--------+------------------+--------+ +----------+--------+-------+----------------+-------------------+  PSV cm/sEDV cmsDescribe        Arm Pressure (mmHG) +----------+--------+-------+----------------+-------------------+ Subclavian100            Multiphasic, WNL                    +----------+--------+-------+----------------+-------------------+ +---------+--------+--+--------+--+---------+ VertebralPSV cm/s58EDV cm/s14Antegrade +---------+--------+--+--------+--+---------+  Left Carotid Findings: +----------+--------+--------+--------+------------------+------------------+           PSV cm/sEDV cm/sStenosisPlaque DescriptionComments           +----------+--------+--------+--------+------------------+------------------+ CCA Prox  81      15                                                   +----------+--------+--------+--------+------------------+------------------+ CCA Distal66      17                                intimal thickening +----------+--------+--------+--------+------------------+------------------+ ICA Prox  65      16              calcific                             +----------+--------+--------+--------+------------------+------------------+ ICA Distal98      34              calcific          tortuous           +----------+--------+--------+--------+------------------+------------------+ +----------+--------+--------+----------------+-------------------+           PSV cm/sEDV cm/sDescribe        Arm  Pressure (mmHG) +----------+--------+--------+----------------+-------------------+ [id number]             Multiphasic, WNL                    +----------+--------+--------+----------------+-------------------+ +---------+--------+--+--------+--+---------+ VertebralPSV cm/s60EDV cm/s14Antegrade +---------+--------+--+--------+--+---------+   Summary: Right Carotid: The extracranial vessels were near-normal with only minimal wall                thickening or plaque. Left Carotid: The extracranial vessels were near-normal with only minimal wall               thickening or plaque. Vertebrals:  Bilateral vertebral arteries demonstrate antegrade flow. Subclavians: Normal flow hemodynamics were seen in bilateral subclavian              arteries. *See table(s) above for measurements and observations.  Electronically signed by Delia Heady MD on 08/23/2023 at 6:06:50 PM.    Final    ECHOCARDIOGRAM COMPLETE  Result Date: 08/23/2023    ECHOCARDIOGRAM REPORT   Patient Name:   Katelyn Jackson Date of Exam: 08/23/2023 Medical Rec #:  [id number]         Height:       61.0 in Accession #:    [id number]        Weight:       202.4 lb Date of Birth:  Mar 20, 1946         BSA:          1.898 m Patient Age:    77 years          BP:           133/63 mmHg Patient Gender: F  spinal canal and lateral recesses are sufficiently patent. There is chronic mild to moderate right and severe left foraminal narrowing which has slightly progressed. IMPRESSION: 1. No acute findings or clear explanation for the patient's symptoms. No evidence of metastatic disease within the lumbar spine. 2. Multilevel spondylosis has progressed from remote MRI from 2018. Associated discal T2 hyperintensity at the lower 3 levels appears degenerative. No evidence of discitis/osteomyelitis. 3. At L3-4, there is mild to moderate multifactorial  spinal stenosis with increasing mild to moderate left greater than right lateral recess and foraminal narrowing. 4. Mild multifactorial spinal stenosis at L4-5 with mildly increased narrowing of the left lateral recess. 5. Slightly progressive chronic mild to moderate right and severe left foraminal narrowing at L5-S1. Electronically Signed   By: Carey Bullocks M.D.   On: 08/24/2023 13:32   VAS US CAROTID  Result Date: 08/23/2023 Carotid Arterial Duplex Study Patient Name:  Katelyn Jackson  Date of Exam:   08/22/2023 Medical Rec #: [id number]          Accession #:    [id number] Date of Birth: 03-27-1946          Patient Gender: F Patient Age:   77 years Exam Location:  [facility] Procedure:      VAS US CAROTID Referring Phys: Bess Harvest [facility] --------------------------------------------------------------------------------  Indications:       TIA. Risk Factors:      Hypertension, hyperlipidemia, no history of smoking. Other Factors:     Afib, PM. Limitations        Today's exam was limited due to the body habitus of the                    patient and tortuous vessels. Comparison Study:  No previous exams Performing Technologist: Jody Hill RVT, RDMS  Examination Guidelines: A complete evaluation includes B-mode imaging, spectral Doppler, color Doppler, and power Doppler as needed of all accessible portions of each vessel. Bilateral testing is considered an integral part of a complete examination. Limited examinations for reoccurring indications may be performed as noted.  Right Carotid Findings: +----------+--------+--------+--------+------------------+--------+           PSV cm/sEDV cm/sStenosisPlaque DescriptionComments +----------+--------+--------+--------+------------------+--------+ CCA Prox  76      12                                tortuous +----------+--------+--------+--------+------------------+--------+ CCA Distal89      15                                          +----------+--------+--------+--------+------------------+--------+ ICA Prox  85      24                                         +----------+--------+--------+--------+------------------+--------+ ICA Distal79      25                                tortuous +----------+--------+--------+--------+------------------+--------+ ECA       66      10                                         +----------+--------+--------+--------+------------------+--------+ +----------+--------+-------+----------------+-------------------+  PROGRESS NOTE                                                                                                                                                                                                             Patient Demographics:    Katelyn Jackson, is a 77 y.o. female, DOB - 06-05-1946, WUJ:[id number]  Outpatient Primary MD for the patient is Plotnikov, Georgina Quint, MD    LOS - 5  Admit date - 08/20/2023    Chief Complaint  Patient presents with   Fall       Brief Narrative (HPI from H&P)     7 Russian-speaking female with history of permanent A-fib on anticoagulation, status post pacemaker, decompensated liver cirrhosis, CKD stage 2 hypertension, hyperlipidemia, hypothyroidism, chronic lymphedema, history of breast cancer status post radiation therapy, recent trauma with fracture of left upper and lower extremities, wheelchair dependent who was brought from home after a level 2 trauma following ground-level fall.  She lives with her son.  Patient noted right-sided numbness involving her right face, arm and leg with left-sided weakness before fall.  Suspected stroke on admission but MRI of the brain and cervical spine did not show any acute findings.  She was admitted for further workup, it was significant for UTI.   Subjective:    Katelyn Jackson today with no significant events overnight, no nausea, no vomiting, she reports good night sleep.  .     Assessment  & Plan :   Fall/strokelike symptoms:  - Fell at home.  Developed right-sided numbness/weakness - multiple falls over the last 1 mth. Negative MRI of the brain, cervical spine did not show any acute findings but showed mild C5-6 spinal canal stenosis,moderate bilateral C3-4 and C4-5 neural foraminal stenosis.  Her skeletal survey did not show any acute findings.  PT/OT consulted. -Right CT head with no acute finding as well -MRI lumbar spine difficult  for radiculopathy, but no significant findings to explain right side weakness - Patient has severe weakness and deconditioning at baseline.  Recent hospitalization from  07/10 to 05/24/23 for mechanical fall with left patellar and left distal clavicle fracture.  Recently had surgery of the right shoulder.  Cannot move right shoulder, sided range of motion and strength improving, uses wheelchair at home, continue PT OT, may require SNF. -Workup negative for acute CVA,  PROGRESS NOTE                                                                                                                                                                                                             Patient Demographics:    Katelyn Jackson, is a 77 y.o. female, DOB - 06-05-1946, WUJ:[id number]  Outpatient Primary MD for the patient is Plotnikov, Georgina Quint, MD    LOS - 5  Admit date - 08/20/2023    Chief Complaint  Patient presents with   Fall       Brief Narrative (HPI from H&P)     7 Russian-speaking female with history of permanent A-fib on anticoagulation, status post pacemaker, decompensated liver cirrhosis, CKD stage 2 hypertension, hyperlipidemia, hypothyroidism, chronic lymphedema, history of breast cancer status post radiation therapy, recent trauma with fracture of left upper and lower extremities, wheelchair dependent who was brought from home after a level 2 trauma following ground-level fall.  She lives with her son.  Patient noted right-sided numbness involving her right face, arm and leg with left-sided weakness before fall.  Suspected stroke on admission but MRI of the brain and cervical spine did not show any acute findings.  She was admitted for further workup, it was significant for UTI.   Subjective:    Katelyn Jackson today with no significant events overnight, no nausea, no vomiting, she reports good night sleep.  .     Assessment  & Plan :   Fall/strokelike symptoms:  - Fell at home.  Developed right-sided numbness/weakness - multiple falls over the last 1 mth. Negative MRI of the brain, cervical spine did not show any acute findings but showed mild C5-6 spinal canal stenosis,moderate bilateral C3-4 and C4-5 neural foraminal stenosis.  Her skeletal survey did not show any acute findings.  PT/OT consulted. -Right CT head with no acute finding as well -MRI lumbar spine difficult  for radiculopathy, but no significant findings to explain right side weakness - Patient has severe weakness and deconditioning at baseline.  Recent hospitalization from  07/10 to 05/24/23 for mechanical fall with left patellar and left distal clavicle fracture.  Recently had surgery of the right shoulder.  Cannot move right shoulder, sided range of motion and strength improving, uses wheelchair at home, continue PT OT, may require SNF. -Workup negative for acute CVA,  PROGRESS NOTE                                                                                                                                                                                                             Patient Demographics:    Katelyn Jackson, is a 77 y.o. female, DOB - 06-05-1946, WUJ:[id number]  Outpatient Primary MD for the patient is Plotnikov, Georgina Quint, MD    LOS - 5  Admit date - 08/20/2023    Chief Complaint  Patient presents with   Fall       Brief Narrative (HPI from H&P)     7 Russian-speaking female with history of permanent A-fib on anticoagulation, status post pacemaker, decompensated liver cirrhosis, CKD stage 2 hypertension, hyperlipidemia, hypothyroidism, chronic lymphedema, history of breast cancer status post radiation therapy, recent trauma with fracture of left upper and lower extremities, wheelchair dependent who was brought from home after a level 2 trauma following ground-level fall.  She lives with her son.  Patient noted right-sided numbness involving her right face, arm and leg with left-sided weakness before fall.  Suspected stroke on admission but MRI of the brain and cervical spine did not show any acute findings.  She was admitted for further workup, it was significant for UTI.   Subjective:    Katelyn Jackson today with no significant events overnight, no nausea, no vomiting, she reports good night sleep.  .     Assessment  & Plan :   Fall/strokelike symptoms:  - Fell at home.  Developed right-sided numbness/weakness - multiple falls over the last 1 mth. Negative MRI of the brain, cervical spine did not show any acute findings but showed mild C5-6 spinal canal stenosis,moderate bilateral C3-4 and C4-5 neural foraminal stenosis.  Her skeletal survey did not show any acute findings.  PT/OT consulted. -Right CT head with no acute finding as well -MRI lumbar spine difficult  for radiculopathy, but no significant findings to explain right side weakness - Patient has severe weakness and deconditioning at baseline.  Recent hospitalization from  07/10 to 05/24/23 for mechanical fall with left patellar and left distal clavicle fracture.  Recently had surgery of the right shoulder.  Cannot move right shoulder, sided range of motion and strength improving, uses wheelchair at home, continue PT OT, may require SNF. -Workup negative for acute CVA,

## 2023-08-25 NOTE — Plan of Care (Signed)
  Problem: Education: Goal: Knowledge of General Education information will improve Description: Including pain rating scale, medication(s)/side effects and non-pharmacologic comfort measures Outcome: Progressing   Problem: Clinical Measurements: Goal: Will remain free from infection Outcome: Progressing Goal: Diagnostic test results will improve Outcome: Progressing   Problem: Nutrition: Goal: Adequate nutrition will be maintained Outcome: Progressing   Problem: Elimination: Goal: Will not experience complications related to bowel motility Outcome: Progressing

## 2023-08-25 NOTE — Plan of Care (Signed)
  Problem: Education: Goal: Knowledge of General Education information will improve Description: Including pain rating scale, medication(s)/side effects and non-pharmacologic comfort measures Outcome: Progressing   Problem: Health Behavior/Discharge Planning: Goal: Ability to manage health-related needs will improve Outcome: Progressing   Problem: Clinical Measurements: Goal: Ability to maintain clinical measurements within normal limits will improve Outcome: Progressing Goal: Will remain free from infection Outcome: Progressing Goal: Diagnostic test results will improve Outcome: Progressing   Problem: Safety: Goal: Ability to remain free from injury will improve Outcome: Progressing   

## 2023-08-26 DIAGNOSIS — B9629 Other Escherichia coli [E. coli] as the cause of diseases classified elsewhere: Secondary | ICD-10-CM | POA: Diagnosis not present

## 2023-08-26 DIAGNOSIS — N3000 Acute cystitis without hematuria: Secondary | ICD-10-CM | POA: Diagnosis not present

## 2023-08-26 DIAGNOSIS — Z1612 Extended spectrum beta lactamase (ESBL) resistance: Secondary | ICD-10-CM | POA: Diagnosis not present

## 2023-08-26 DIAGNOSIS — R195 Other fecal abnormalities: Secondary | ICD-10-CM | POA: Diagnosis not present

## 2023-08-26 LAB — CBC
HCT: 26.3 % — ABNORMAL LOW (ref 36.0–46.0)
Hemoglobin: 8 g/dL — ABNORMAL LOW (ref 12.0–15.0)
MCH: 30.1 pg (ref 26.0–34.0)
MCHC: 30.4 g/dL (ref 30.0–36.0)
MCV: 98.9 fL (ref 80.0–100.0)
Platelets: 148 10*3/uL — ABNORMAL LOW (ref 150–400)
RBC: 2.66 MIL/uL — ABNORMAL LOW (ref 3.87–5.11)
RDW: 14.2 % (ref 11.5–15.5)
WBC: 4 10*3/uL (ref 4.0–10.5)
nRBC: 0 % (ref 0.0–0.2)

## 2023-08-26 LAB — BASIC METABOLIC PANEL
Anion gap: 13 (ref 5–15)
BUN: 14 mg/dL (ref 8–23)
CO2: 22 mmol/L (ref 22–32)
Calcium: 10.3 mg/dL (ref 8.9–10.3)
Chloride: 106 mmol/L (ref 98–111)
Creatinine, Ser: 1.26 mg/dL — ABNORMAL HIGH (ref 0.44–1.00)
GFR, Estimated: 44 mL/min — ABNORMAL LOW (ref >60)
Glucose, Bld: 88 mg/dL (ref 70–99)
Potassium: 3.8 mmol/L (ref 3.5–5.1)
Sodium: 141 mmol/L (ref 135–145)

## 2023-08-26 LAB — OCCULT BLOOD X 1 CARD TO LAB, STOOL: Fecal Occult Bld: POSITIVE — AB

## 2023-08-26 NOTE — Plan of Care (Signed)
  Problem: Health Behavior/Discharge Planning: Goal: Ability to manage health-related needs will improve Outcome: Progressing   Problem: Clinical Measurements: Goal: Will remain free from infection Outcome: Progressing Goal: Diagnostic test results will improve Outcome: Progressing   Problem: Activity: Goal: Risk for activity intolerance will decrease Outcome: Progressing   Problem: Elimination: Goal: Will not experience complications related to bowel motility Outcome: Progressing   Problem: Pain Managment: Goal: General experience of comfort will improve Outcome: Progressing   Problem: Safety: Goal: Ability to remain free from injury will improve Outcome: Progressing

## 2023-08-26 NOTE — H&P (View-Only) (Signed)
[facility] Consult  Referring Provider: [facility] hospitalist/Dr. Elgergawy Primary Care Physician:  Tresa Garter, MD Primary Gastroenterologist: Dr. Presley [facility]  Reason for Consultation: Anemia  HPI: Katelyn Jackson is a 77 y.o. female who was admitted on 910/13/24 after a fall. GI was consulted for hemoglobin, occult blood in stool and need to resume blood thinner Eliquis for atrial fibrillation. She was last seen in office on 03/28/2022, when she was referred for cirrhosis diagnosed on ultrasound from 9/22. Her workup for chronic liver disease was negative, and presumed to be likely related to NASH. As per documentation she had a colonoscopy [place] more than 15 years ago. Labs on 5/23 showed normal alpha-fetoprotein, PT 13.5, INR 1.3, BUN 36, creatinine 1.5, GFR 36, T. bili 1.7/AST 22/ALT 13/ALP 151, hemoglobin 10.3, platelet 115 CBC with PCP 07/04/2022 showed hemoglobin 9.1 CT abdomen pelvis with contrast 06/14/2023: Large volume stool within rectal vault with mild perirectal inflammatory stranding and edema suggestive of stercoral proctitis CT abdomen pelvis with contrast 06/09/2023: Large volume of stool throughout colon with rectum measuring 9 x 8.3 cm, perirectal edema and inflammation suggesting fecal impaction, stercoral colitis cannot be excluded, moderate hiatal hernia.  Patient denies nausea, vomiting, states she normally has 2 bowel movements a day, and denies noticing blood in stools or black stools. She denies acid reflux, heartburn, difficulty swallowing or pain on swallowing. She denies unintentional weight loss or loss of appetite. She is mostly wheelchair dependent.  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 coli carrier 09/22/2021   GERD (gastroesophageal reflux disease)    Headache    Herpes labialis 08/19/2021   Hyperlipidemia    Hypertension    Hypothyroidism    Lymphedema    Moderate to severe pulmonary hypertension (HCC)    Obesity    Osteoarthritis of right knee 08/19/2021   Personal history of radiation therapy    Pneumonia 12/2020   Presence of permanent cardiac pacemaker 01/21/2021   for bradycardia    Syncope 12/2020   needed a pacemaker    Past Surgical History:  Procedure Laterality Date   APPENDECTOMY     BREAST BIOPSY Left 2018   BREAST LUMPECTOMY Left 04/04/2017   x3   BREAST LUMPECTOMY WITH NEEDLE LOCALIZATION AND AXILLARY SENTINEL LYMPH NODE BX Left 04/04/2017   Procedure: BREAST LUMPECTOMY WITH NEEDLE LOCALIZATION x3 AND AXILLARY SENTINEL LYMPH NODE BX;  Surgeon: Almond Lint, MD;  Location: [facility] OR;  Service: General;  Laterality: Left;   CATARACT EXTRACTION W/ INTRAOCULAR LENS  IMPLANT, BILATERAL Bilateral 2018   COLONOSCOPY     EYE SURGERY     GLAUCOMA SURGERY Bilateral 2018   HARDWARE REMOVAL Right 07/26/2021   Procedure: RIGHT SHOULDER HARDWARE REMOVAL WITH WASHOUT;  Surgeon: Bjorn Pippin, MD;  Location: [facility] ORS;  Service: Orthopedics;  Laterality: Right;   INSERT / REPLACE / REMOVE PACEMAKER  01/21/2021   IR FLUORO GUIDE CV LINE LEFT  07/29/2021   IR REMOVAL TUN CV CATH W/O FL  09/07/2021   IR US GUIDE BX ASP/DRAIN  07/16/2021   RE-EXCISION OF BREAST CANCER,SUPERIOR  MARGINS Left 04/26/2017   Procedure: RE-EXCISION OF LEFT BREAST CANCER;  Surgeon: Almond Lint, MD;  Location: [facility] OR;  Service: General;  Laterality: Left;   SHOULDER HEMI-ARTHROPLASTY Right 03/17/2021   Procedure: SHOULDER HEMI-ARTHROPLASTY;  Surgeon: Bjorn Pippin, MD;  Location: [facility];  Service: Orthopedics;  Laterality: Right;    Prior to Admission medications   Medication Sig Start Date End Date Taking?  Authorizing Provider  allopurinol (ZYLOPRIM) 100 MG tablet Take 0.5 tablets (50 mg total) by mouth daily. 11/23/22  Yes Plotnikov, Georgina Quint, MD  apixaban (ELIQUIS) 5 MG TABS tablet Take 1 tablet (5 mg total) by mouth 2 (two) times daily. 11/23/22  Yes Plotnikov, Georgina Quint, MD  carvedilol (COREG) 12.5 MG tablet Take 12.5 mg by mouth 2 (two) times daily with a meal.   Yes [provider]  lactulose (CHRONULAC) 10 GM/15ML solution TAKE 15 MILLILITERS BY MOUTH THREE TIMESDAILY Patient taking differently: Take 10 g by mouth 3 (three) times daily. 02/10/23  Yes Plotnikov, Georgina Quint, MD  levothyroxine (SYNTHROID) 50 MCG tablet Take 1 tablet (50 mcg total) by mouth daily before breakfast. Overdue for Annual appt must see provider for future refills 11/23/22  Yes Plotnikov, Georgina Quint, MD  lovastatin (MEVACOR) 20 MG tablet Take 20 mg by mouth at bedtime.   Yes [provider]  magnesium oxide (MAG-OX) 400 MG tablet Take 1 tablet (400 mg total) by mouth 2 (two) times daily. 09/15/22  Yes Plotnikov, Georgina Quint, MD  Menthol-Zinc Oxide (CALMOSEPTINE) 0.44-20.6 % OINT Apply 1 application  topically in the morning and at bedtime.   Yes [provider]  Nystatin (GERHARDT'S BUTT CREAM) CREA Apply 1 Application topically 2 (two) times daily. 06/15/23  Yes Azucena Fallen, MD  Olopatadine HCl 0.2 % SOLN Place 1 drop into the right eye in the morning and at bedtime.   Yes [provider]  omeprazole (PRILOSEC) 40 MG capsule TAKE ONE CAPSULE BY MOUTH DAILY Patient taking differently: Take 40 mg by mouth daily before breakfast. 08/16/22  Yes Plotnikov, Georgina Quint, MD  polyethylene glycol (MIRALAX / GLYCOLAX) 17 g packet Take 17 g by mouth 2 (two) times daily. 06/15/23  Yes Azucena Fallen, MD  potassium chloride (KLOR-CON) 8 MEQ tablet TAKE ONE (1) TABLET BY MOUTH EVERY DAY 11/30/22  Yes Plotnikov, Georgina Quint, MD  protein supplement (PROSOURCE NO CARB) LIQD Take 30 mLs by mouth 2 (two)  times daily.   Yes [provider]  rifaximin (XIFAXAN) 550 MG TABS tablet Take 1 tablet (550 mg total) by mouth 2 (two) times daily. 06/16/22  Yes Plotnikov, Georgina Quint, MD  sennosides-docusate sodium (SENOKOT-S) 8.6-50 MG tablet Take 2 tablets by mouth every 12 (twelve) hours.   Yes [provider]  spironolactone (ALDACTONE) 25 MG tablet Take 25 mg by mouth daily.   Yes [provider]  torsemide (DEMADEX) 100 MG tablet Take 1 tablet (100 mg total) by mouth 2 (two) times daily. Patient taking differently: Take 100 mg by mouth 2 (two) times daily. 11/25/21  Yes Plotnikov, Georgina Quint, MD  triamcinolone cream (KENALOG) 0.1 % Apply topically 2 (two) times daily. Patient taking differently: Apply 1 Application topically 2 (two) times daily. To dry patches on BLE and bilateral feet 02/01/23  Yes Plotnikov, Georgina Quint, MD  bisacodyl (DULCOLAX) 10 MG suppository Place 10 mg rectally as needed for moderate constipation. Patient not taking: Reported on 08/21/2023    [provider]    Current Facility-Administered Medications  Medication Dose Route Frequency Provider Last Rate Last Admin   acetaminophen (TYLENOL) tablet 1,000 mg  1,000 mg Oral Q6H PRN Dolly Rias, MD   1,000 mg at 08/25/23 2049   allopurinol (ZYLOPRIM) tablet 50 mg  50 mg Oral Daily Segars, Christiane Ha, MD   50 mg at 08/26/23 1047   cyanocobalamin (VITAMIN B12) injection 1,000 mcg  1,000 mcg Subcutaneous Daily Elgergawy, Leana Roe, MD   1,000 mcg at 08/26/23 1047   Followed by   Melene Muller ON 08/30/2023] cyanocobalamin (VITAMIN B12) injection 1,000 mcg  1,000 mcg Subcutaneous Weekly Elgergawy, Leana Roe, MD       ezetimibe (ZETIA) tablet 10 mg  10 mg Oral Daily Leroy Sea, MD   10 mg at 08/26/23 1047   lactulose (CHRONULAC) 10 GM/15ML solution 10 g  10 g Oral TID Dolly Rias, MD   10 g at 08/25/23 2033   levothyroxine (SYNTHROID) tablet 50 mcg  50 mcg Oral Q0600 Dolly Rias, MD   50 mcg at  08/26/23 0517   magnesium citrate solution 0.5 Bottle  0.5 Bottle Oral Once PRN Elgergawy, Leana Roe, MD       magnesium citrate solution 1 Bottle  1 Bottle Oral Once Elgergawy, Leana Roe, MD       meropenem (MERREM) 1 g in sodium chloride 0.9 % 100 mL IVPB  1 g Intravenous Q12H Pham, Minh Q, RPH-CPP 200 mL/hr at 08/26/23 1102 1 g at 08/26/23 1102   metoprolol tartrate (LOPRESSOR) injection 5 mg  5 mg Intravenous Q8H PRN Leroy Sea, MD       oxyCODONE (Oxy IR/ROXICODONE) immediate release tablet 2.5 mg  2.5 mg Oral Q6H PRN Dolly Rias, MD   2.5 mg at 08/25/23 0542   pantoprazole (PROTONIX) EC tablet 40 mg  40 mg Oral Daily Segars, Christiane Ha, MD   40 mg at 08/26/23 1047   polyethylene glycol (MIRALAX / GLYCOLAX) packet 17 g  17 g Oral BID PRN Dolly Rias, MD   17 g at 08/25/23 0918   pravastatin (PRAVACHOL) tablet 20 mg  20 mg Oral q1800 Dolly Rias, MD   20 mg at 08/25/23 1823   rifaximin (XIFAXAN) tablet 550 mg  550 mg Oral BID Dolly Rias, MD   550 mg at 08/26/23 1047    Allergies as of 08/20/2023 - Review Complete 08/20/2023  Allergen Reaction Noted   Hibiclens [chlorhexidine gluconate] Hives 04/26/2017   Penicillins Itching 04/05/2016    Family History  Problem Relation Age of Onset   CAD Mother 24   Stroke Mother 68       hemorr CVA   COPD Father 30   Cancer Neg Hx     Social History   Socioeconomic History   Marital status: Widowed    Spouse name: Not on file   Number of children: 1   Years of education: Not on file   Highest education level: Not on file  Occupational History   Not on file  Tobacco Use   Smoking status: Never   Smokeless tobacco: Never  Vaping Use   Vaping status: Never Used  Substance and Sexual Activity   Alcohol use: No    Alcohol/week: 0.0 standard drinks of alcohol   Drug use: No   Sexual activity: Not on file  Other Topics Concern   Not on file  Social History Narrative   Not on file   Social Determinants of  Health   Financial Resource Strain: Low Risk  (06/15/2022)   Overall  Financial Resource Strain (CARDIA)    Difficulty of Paying Living Expenses: Not hard at all  Food Insecurity: No Food Insecurity (08/21/2023)   Hunger Vital Sign    Worried About Running Out of Food in the Last Year: Never true    Ran Out of Food in the Last Year: Never true  Transportation Needs: No Transportation Needs (08/21/2023)   PRAPARE - Administrator, Civil Service (Medical): No    Lack of Transportation (Non-Medical): No  Physical Activity: Inactive (06/15/2022)   Exercise Vital Sign    Days of Exercise per Week: 0 days    Minutes of Exercise per Session: 0 min  Stress: No Stress Concern Present (06/15/2022)   [employer] of Occupational Health - Occupational Stress Questionnaire    Feeling of Stress : Not at all  Social Connections: Socially Isolated (06/15/2022)   Social Connection and Isolation Panel [NHANES]    Frequency of Communication with Friends and Family: More than three times a week    Frequency of Social Gatherings with Friends and Family: Once a week    Attends Religious Services: Never    Database administrator or Organizations: No    Attends Banker Meetings: Never    Marital Status: Widowed  Intimate Partner Violence: Not At Risk (08/21/2023)   Humiliation, Afraid, Rape, and Kick questionnaire    Fear of Current or Ex-Partner: No    Emotionally Abused: No    Physically Abused: No    Sexually Abused: No    Review of Systems: As per HPI  Physical Exam: Vital signs in last 24 hours: Temp:  [97.9 F (36.6 C)-98.6 F (37 C)] 98.6 F (37 C) (10/19 0800) Pulse Rate:  [60-66] 60 (10/19 0800) Resp:  [17-22] 21 (10/19 0800) BP: (114-139)/(56-102) 114/102 (10/19 0800) SpO2:  [95 %-100 %] 98 % (10/19 0400) Last BM Date : 08/24/23  General:   Alert,  Well-developed, well-nourished, pleasant and cooperative in NAD Head:  Normocephalic and atraumatic. Eyes:   Sclera clear, no icterus.  Mild pallor Ears:  Normal auditory acuity. Nose:  No deformity, discharge,  or lesions. Mouth:  No deformity or lesions.  Oropharynx pink & moist. Neck:  Supple; no masses or thyromegaly. Lungs:  Clear throughout to auscultation.   No wheezes, crackles, or rhonchi. No acute distress. Heart:  Regular rate and rhythm; no murmurs, clicks, rubs,  or gallops. Extremities:  Without clubbing or edema. Neurologic:  Alert and  oriented x4;  grossly normal neurologically. Skin:  Intact without significant lesions or rashes. Psych:  Alert and cooperative. Normal mood and affect. Abdomen:  Soft, nontender and nondistended. No masses, hepatosplenomegaly or hernias noted. Normal bowel sounds, without guarding, and without rebound.         Lab Results: Recent Labs    08/24/23 0458 08/25/23 0254 08/26/23 0438  WBC 4.0 4.0 4.0  HGB 7.8* 7.3* 8.0*  HCT 25.1* 24.0* 26.3*  PLT 137* 147* 148*   BMET Recent Labs    08/24/23 0458 08/25/23 0254 08/26/23 0438  NA 141 139 141  K 3.6 3.8 3.8  CL 106 109 106  CO2 25 24 22   GLUCOSE 87 97 88  BUN 20 18 14   CREATININE 1.38* 1.29* 1.26*  CALCIUM 9.7 9.2 10.3   LFT No results for input(s): "PROT", "ALBUMIN", "AST", "ALT", "ALKPHOS", "BILITOT", "BILIDIR", "IBILI" in the last 72 hours. PT/INR No results for input(s): "LABPROT", "INR" in the last 72 hours.  Studies/Results: No results found.  Impression: Anemia without obvious melena or hematochezia Hemoglobin trend: 7.9/7.4/7.7/7.8/7.3/8 FOBT positive  History of cirrhosis  Without signs of ascites,  ? History of encephalopathy , currently on lactulose and rifaximin No history of variceal bleed MELD sodium of 12  No prior EGD Patient states last colonoscopy was over 15 years ago  Plan: Recommend diagnostic EGD in a.m. to evaluate for esophageal varices and portal hypertensive gastropathy given history of cirrhosis. Patient refusing to undergo  colonoscopy. She has had several CAT scans which have not shown any suspicious colonic lesions. Will keep patient n.p.o. postmidnight. She is currently on pantoprazole 40 mg once a day.   LOS: 6 days   Kerin Salen, MD  08/26/2023, 11:23 AM

## 2023-08-26 NOTE — Progress Notes (Signed)
for measurements and observations.  Electronically signed by Delia Heady MD on 08/23/2023 at 6:06:50 PM.    Final    ECHOCARDIOGRAM COMPLETE  Result Date: 08/23/2023    ECHOCARDIOGRAM REPORT   Patient Name:   Katelyn Jackson Date of Exam: 08/23/2023 Medical Rec #:  [id number]         Height:       61.0 in Accession #:    [id number]        Weight:       202.4 lb Date of Birth:  01-07-1946         BSA:          1.898 m Patient Age:    77 years          BP:           133/63 mmHg Patient  Gender: F                 HR:           66 bpm. Exam Location:  Inpatient Procedure: 2D Echo, Cardiac Doppler and Color Doppler Indications:    CHF I50.31  History:        Patient has prior history of Echocardiogram examinations, most                 recent 03/19/2021. CHF, Pacemaker, Pulmonary HTN,                 Arrythmias:Atrial Fibrillation; Risk Factors:Hypertension,                 Dyslipidemia and Non-Smoker.  Sonographer:    Dondra Prader RVT RCS Referring Phys: [id number] [facility] [place] IMPRESSIONS  1. Left ventricular ejection fraction, by estimation, is 40 to 45%. The left ventricle has mildly decreased function. The left ventricle demonstrates global hypokinesis. The left ventricular internal cavity size was mildly to moderately dilated. Left ventricular diastolic function could not be evaluated.  2. Right ventricular systolic function is moderately reduced. The right ventricular size is moderately enlarged. There is mildly elevated pulmonary artery systolic pressure. The estimated right ventricular systolic pressure is 36.7 mmHg.  3. Left atrial size was severely dilated.  4. Appears to be a PFO with L to R shunting. Evidence of atrial level shunting detected by color flow Doppler.  5. Right atrial size was severely dilated.  6. The mitral valve is abnormal. Severe mitral valve regurgitation. No evidence of mitral stenosis.  7. Tricuspid valve regurgitation is mild to moderate.  8. The aortic valve is tricuspid. Aortic valve regurgitation is trivial. No aortic stenosis is present.  9. The inferior vena cava is dilated in size with <50% respiratory variability, suggesting right atrial pressure of 15 mmHg. Comparison(s): No significant change from prior study. FINDINGS  Left Ventricle: Left ventricular ejection fraction, by estimation, is 40 to 45%. The left ventricle has mildly decreased function. The left ventricle demonstrates global hypokinesis. The left ventricular internal cavity size was mildly to  moderately dilated. There is no left ventricular hypertrophy. Left ventricular diastolic function could not be evaluated due to atrial fibrillation. Left ventricular diastolic function could not be evaluated. Right Ventricle: The right ventricular size is moderately enlarged. No increase in right ventricular wall thickness. Right ventricular systolic function is moderately reduced. There is mildly elevated pulmonary artery systolic pressure. The tricuspid regurgitant velocity is 2.33 m/s, and with an assumed right atrial pressure of 15 mmHg, the estimated right ventricular systolic pressure is 36.7 mmHg. Left Atrium: Left atrial  for measurements and observations.  Electronically signed by Delia Heady MD on 08/23/2023 at 6:06:50 PM.    Final    ECHOCARDIOGRAM COMPLETE  Result Date: 08/23/2023    ECHOCARDIOGRAM REPORT   Patient Name:   Katelyn Jackson Date of Exam: 08/23/2023 Medical Rec #:  [id number]         Height:       61.0 in Accession #:    [id number]        Weight:       202.4 lb Date of Birth:  01-07-1946         BSA:          1.898 m Patient Age:    77 years          BP:           133/63 mmHg Patient  Gender: F                 HR:           66 bpm. Exam Location:  Inpatient Procedure: 2D Echo, Cardiac Doppler and Color Doppler Indications:    CHF I50.31  History:        Patient has prior history of Echocardiogram examinations, most                 recent 03/19/2021. CHF, Pacemaker, Pulmonary HTN,                 Arrythmias:Atrial Fibrillation; Risk Factors:Hypertension,                 Dyslipidemia and Non-Smoker.  Sonographer:    Dondra Prader RVT RCS Referring Phys: [id number] [facility] [place] IMPRESSIONS  1. Left ventricular ejection fraction, by estimation, is 40 to 45%. The left ventricle has mildly decreased function. The left ventricle demonstrates global hypokinesis. The left ventricular internal cavity size was mildly to moderately dilated. Left ventricular diastolic function could not be evaluated.  2. Right ventricular systolic function is moderately reduced. The right ventricular size is moderately enlarged. There is mildly elevated pulmonary artery systolic pressure. The estimated right ventricular systolic pressure is 36.7 mmHg.  3. Left atrial size was severely dilated.  4. Appears to be a PFO with L to R shunting. Evidence of atrial level shunting detected by color flow Doppler.  5. Right atrial size was severely dilated.  6. The mitral valve is abnormal. Severe mitral valve regurgitation. No evidence of mitral stenosis.  7. Tricuspid valve regurgitation is mild to moderate.  8. The aortic valve is tricuspid. Aortic valve regurgitation is trivial. No aortic stenosis is present.  9. The inferior vena cava is dilated in size with <50% respiratory variability, suggesting right atrial pressure of 15 mmHg. Comparison(s): No significant change from prior study. FINDINGS  Left Ventricle: Left ventricular ejection fraction, by estimation, is 40 to 45%. The left ventricle has mildly decreased function. The left ventricle demonstrates global hypokinesis. The left ventricular internal cavity size was mildly to  moderately dilated. There is no left ventricular hypertrophy. Left ventricular diastolic function could not be evaluated due to atrial fibrillation. Left ventricular diastolic function could not be evaluated. Right Ventricle: The right ventricular size is moderately enlarged. No increase in right ventricular wall thickness. Right ventricular systolic function is moderately reduced. There is mildly elevated pulmonary artery systolic pressure. The tricuspid regurgitant velocity is 2.33 m/s, and with an assumed right atrial pressure of 15 mmHg, the estimated right ventricular systolic pressure is 36.7 mmHg. Left Atrium: Left atrial  Mild bilateral facet hypertrophy. No spinal stenosis or foraminal narrowing. L2-3: Stable mild loss of disc height with mild disc bulging and mild bilateral facet hypertrophy. No significant spinal stenosis or foraminal narrowing. L3-4: Progressive loss of disc height with annular disc bulging and endplate osteophytes compared with remote MRI. There is mild facet and ligamentous hypertrophy. Mild to moderate multifactorial spinal stenosis does not appear significantly changed. However, there is increasing mild to moderate left greater than right lateral recess and foraminal narrowing. L4-5: Chronic loss of disc height with annular disc bulging and endplate osteophytes, similar to remote MRI. Stable mild multifactorial spinal stenosis. There is mildly increased narrowing of the left lateral recess. Mild foraminal narrowing bilaterally has not significantly progressed. L5-S1: Chronic loss of disc height with annular disc bulging and endplate osteophytes asymmetric to the left, similar to  remote MRI. Mild facet and ligamentous hypertrophy. The spinal canal and lateral recesses are sufficiently patent. There is chronic mild to moderate right and severe left foraminal narrowing which has slightly progressed. IMPRESSION: 1. No acute findings or clear explanation for the patient's symptoms. No evidence of metastatic disease within the lumbar spine. 2. Multilevel spondylosis has progressed from remote MRI from 2018. Associated discal T2 hyperintensity at the lower 3 levels appears degenerative. No evidence of discitis/osteomyelitis. 3. At L3-4, there is mild to moderate multifactorial spinal stenosis with increasing mild to moderate left greater than right lateral recess and foraminal narrowing. 4. Mild multifactorial spinal stenosis at L4-5 with mildly increased narrowing of the left lateral recess. 5. Slightly progressive chronic mild to moderate right and severe left foraminal narrowing at L5-S1. Electronically Signed   By: Carey Bullocks M.D.   On: 08/24/2023 13:32   VAS US CAROTID  Result Date: 08/23/2023 Carotid Arterial Duplex Study Patient Name:  BREONAH RUGGLES  Date of Exam:   08/22/2023 Medical Rec #: [id number]          Accession #:    [id number] Date of Birth: [birth year]          Patient Gender: F Patient Age:   65 years Exam Location:  [facility] Procedure:      VAS US CAROTID Referring Phys: [facility] --------------------------------------------------------------------------------  Indications:       TIA. Risk Factors:      Hypertension, hyperlipidemia, no history of smoking. Other Factors:     Afib, PM. Limitations        Today's exam was limited due to the body habitus of the                    patient and tortuous vessels. Comparison Study:  No previous exams Performing Technologist: Jody Hill RVT, RDMS  Examination Guidelines: A complete evaluation includes B-mode imaging, spectral Doppler, color Doppler, and power Doppler as needed of all accessible portions of each  vessel. Bilateral testing is considered an integral part of a complete examination. Limited examinations for reoccurring indications may be performed as noted.  Right Carotid Findings: +----------+--------+--------+--------+------------------+--------+           PSV cm/sEDV cm/sStenosisPlaque DescriptionComments +----------+--------+--------+--------+------------------+--------+ CCA Prox  76      12                                tortuous +----------+--------+--------+--------+------------------+--------+ CCA Distal89      15                                         +----------+--------+--------+--------+------------------+--------+  Mild bilateral facet hypertrophy. No spinal stenosis or foraminal narrowing. L2-3: Stable mild loss of disc height with mild disc bulging and mild bilateral facet hypertrophy. No significant spinal stenosis or foraminal narrowing. L3-4: Progressive loss of disc height with annular disc bulging and endplate osteophytes compared with remote MRI. There is mild facet and ligamentous hypertrophy. Mild to moderate multifactorial spinal stenosis does not appear significantly changed. However, there is increasing mild to moderate left greater than right lateral recess and foraminal narrowing. L4-5: Chronic loss of disc height with annular disc bulging and endplate osteophytes, similar to remote MRI. Stable mild multifactorial spinal stenosis. There is mildly increased narrowing of the left lateral recess. Mild foraminal narrowing bilaterally has not significantly progressed. L5-S1: Chronic loss of disc height with annular disc bulging and endplate osteophytes asymmetric to the left, similar to  remote MRI. Mild facet and ligamentous hypertrophy. The spinal canal and lateral recesses are sufficiently patent. There is chronic mild to moderate right and severe left foraminal narrowing which has slightly progressed. IMPRESSION: 1. No acute findings or clear explanation for the patient's symptoms. No evidence of metastatic disease within the lumbar spine. 2. Multilevel spondylosis has progressed from remote MRI from 2018. Associated discal T2 hyperintensity at the lower 3 levels appears degenerative. No evidence of discitis/osteomyelitis. 3. At L3-4, there is mild to moderate multifactorial spinal stenosis with increasing mild to moderate left greater than right lateral recess and foraminal narrowing. 4. Mild multifactorial spinal stenosis at L4-5 with mildly increased narrowing of the left lateral recess. 5. Slightly progressive chronic mild to moderate right and severe left foraminal narrowing at L5-S1. Electronically Signed   By: Carey Bullocks M.D.   On: 08/24/2023 13:32   VAS US CAROTID  Result Date: 08/23/2023 Carotid Arterial Duplex Study Patient Name:  BREONAH RUGGLES  Date of Exam:   08/22/2023 Medical Rec #: [id number]          Accession #:    [id number] Date of Birth: [birth year]          Patient Gender: F Patient Age:   65 years Exam Location:  [facility] Procedure:      VAS US CAROTID Referring Phys: [facility] --------------------------------------------------------------------------------  Indications:       TIA. Risk Factors:      Hypertension, hyperlipidemia, no history of smoking. Other Factors:     Afib, PM. Limitations        Today's exam was limited due to the body habitus of the                    patient and tortuous vessels. Comparison Study:  No previous exams Performing Technologist: Jody Hill RVT, RDMS  Examination Guidelines: A complete evaluation includes B-mode imaging, spectral Doppler, color Doppler, and power Doppler as needed of all accessible portions of each  vessel. Bilateral testing is considered an integral part of a complete examination. Limited examinations for reoccurring indications may be performed as noted.  Right Carotid Findings: +----------+--------+--------+--------+------------------+--------+           PSV cm/sEDV cm/sStenosisPlaque DescriptionComments +----------+--------+--------+--------+------------------+--------+ CCA Prox  76      12                                tortuous +----------+--------+--------+--------+------------------+--------+ CCA Distal89      15                                         +----------+--------+--------+--------+------------------+--------+  for measurements and observations.  Electronically signed by Delia Heady MD on 08/23/2023 at 6:06:50 PM.    Final    ECHOCARDIOGRAM COMPLETE  Result Date: 08/23/2023    ECHOCARDIOGRAM REPORT   Patient Name:   Katelyn Jackson Date of Exam: 08/23/2023 Medical Rec #:  [id number]         Height:       61.0 in Accession #:    [id number]        Weight:       202.4 lb Date of Birth:  01-07-1946         BSA:          1.898 m Patient Age:    77 years          BP:           133/63 mmHg Patient  Gender: F                 HR:           66 bpm. Exam Location:  Inpatient Procedure: 2D Echo, Cardiac Doppler and Color Doppler Indications:    CHF I50.31  History:        Patient has prior history of Echocardiogram examinations, most                 recent 03/19/2021. CHF, Pacemaker, Pulmonary HTN,                 Arrythmias:Atrial Fibrillation; Risk Factors:Hypertension,                 Dyslipidemia and Non-Smoker.  Sonographer:    Dondra Prader RVT RCS Referring Phys: [id number] [facility] [place] IMPRESSIONS  1. Left ventricular ejection fraction, by estimation, is 40 to 45%. The left ventricle has mildly decreased function. The left ventricle demonstrates global hypokinesis. The left ventricular internal cavity size was mildly to moderately dilated. Left ventricular diastolic function could not be evaluated.  2. Right ventricular systolic function is moderately reduced. The right ventricular size is moderately enlarged. There is mildly elevated pulmonary artery systolic pressure. The estimated right ventricular systolic pressure is 36.7 mmHg.  3. Left atrial size was severely dilated.  4. Appears to be a PFO with L to R shunting. Evidence of atrial level shunting detected by color flow Doppler.  5. Right atrial size was severely dilated.  6. The mitral valve is abnormal. Severe mitral valve regurgitation. No evidence of mitral stenosis.  7. Tricuspid valve regurgitation is mild to moderate.  8. The aortic valve is tricuspid. Aortic valve regurgitation is trivial. No aortic stenosis is present.  9. The inferior vena cava is dilated in size with <50% respiratory variability, suggesting right atrial pressure of 15 mmHg. Comparison(s): No significant change from prior study. FINDINGS  Left Ventricle: Left ventricular ejection fraction, by estimation, is 40 to 45%. The left ventricle has mildly decreased function. The left ventricle demonstrates global hypokinesis. The left ventricular internal cavity size was mildly to  moderately dilated. There is no left ventricular hypertrophy. Left ventricular diastolic function could not be evaluated due to atrial fibrillation. Left ventricular diastolic function could not be evaluated. Right Ventricle: The right ventricular size is moderately enlarged. No increase in right ventricular wall thickness. Right ventricular systolic function is moderately reduced. There is mildly elevated pulmonary artery systolic pressure. The tricuspid regurgitant velocity is 2.33 m/s, and with an assumed right atrial pressure of 15 mmHg, the estimated right ventricular systolic pressure is 36.7 mmHg. Left Atrium: Left atrial  for measurements and observations.  Electronically signed by Delia Heady MD on 08/23/2023 at 6:06:50 PM.    Final    ECHOCARDIOGRAM COMPLETE  Result Date: 08/23/2023    ECHOCARDIOGRAM REPORT   Patient Name:   Katelyn Jackson Date of Exam: 08/23/2023 Medical Rec #:  [id number]         Height:       61.0 in Accession #:    [id number]        Weight:       202.4 lb Date of Birth:  01-07-1946         BSA:          1.898 m Patient Age:    77 years          BP:           133/63 mmHg Patient  Gender: F                 HR:           66 bpm. Exam Location:  Inpatient Procedure: 2D Echo, Cardiac Doppler and Color Doppler Indications:    CHF I50.31  History:        Patient has prior history of Echocardiogram examinations, most                 recent 03/19/2021. CHF, Pacemaker, Pulmonary HTN,                 Arrythmias:Atrial Fibrillation; Risk Factors:Hypertension,                 Dyslipidemia and Non-Smoker.  Sonographer:    Dondra Prader RVT RCS Referring Phys: [id number] [facility] [place] IMPRESSIONS  1. Left ventricular ejection fraction, by estimation, is 40 to 45%. The left ventricle has mildly decreased function. The left ventricle demonstrates global hypokinesis. The left ventricular internal cavity size was mildly to moderately dilated. Left ventricular diastolic function could not be evaluated.  2. Right ventricular systolic function is moderately reduced. The right ventricular size is moderately enlarged. There is mildly elevated pulmonary artery systolic pressure. The estimated right ventricular systolic pressure is 36.7 mmHg.  3. Left atrial size was severely dilated.  4. Appears to be a PFO with L to R shunting. Evidence of atrial level shunting detected by color flow Doppler.  5. Right atrial size was severely dilated.  6. The mitral valve is abnormal. Severe mitral valve regurgitation. No evidence of mitral stenosis.  7. Tricuspid valve regurgitation is mild to moderate.  8. The aortic valve is tricuspid. Aortic valve regurgitation is trivial. No aortic stenosis is present.  9. The inferior vena cava is dilated in size with <50% respiratory variability, suggesting right atrial pressure of 15 mmHg. Comparison(s): No significant change from prior study. FINDINGS  Left Ventricle: Left ventricular ejection fraction, by estimation, is 40 to 45%. The left ventricle has mildly decreased function. The left ventricle demonstrates global hypokinesis. The left ventricular internal cavity size was mildly to  moderately dilated. There is no left ventricular hypertrophy. Left ventricular diastolic function could not be evaluated due to atrial fibrillation. Left ventricular diastolic function could not be evaluated. Right Ventricle: The right ventricular size is moderately enlarged. No increase in right ventricular wall thickness. Right ventricular systolic function is moderately reduced. There is mildly elevated pulmonary artery systolic pressure. The tricuspid regurgitant velocity is 2.33 m/s, and with an assumed right atrial pressure of 15 mmHg, the estimated right ventricular systolic pressure is 36.7 mmHg. Left Atrium: Left atrial  Mild bilateral facet hypertrophy. No spinal stenosis or foraminal narrowing. L2-3: Stable mild loss of disc height with mild disc bulging and mild bilateral facet hypertrophy. No significant spinal stenosis or foraminal narrowing. L3-4: Progressive loss of disc height with annular disc bulging and endplate osteophytes compared with remote MRI. There is mild facet and ligamentous hypertrophy. Mild to moderate multifactorial spinal stenosis does not appear significantly changed. However, there is increasing mild to moderate left greater than right lateral recess and foraminal narrowing. L4-5: Chronic loss of disc height with annular disc bulging and endplate osteophytes, similar to remote MRI. Stable mild multifactorial spinal stenosis. There is mildly increased narrowing of the left lateral recess. Mild foraminal narrowing bilaterally has not significantly progressed. L5-S1: Chronic loss of disc height with annular disc bulging and endplate osteophytes asymmetric to the left, similar to  remote MRI. Mild facet and ligamentous hypertrophy. The spinal canal and lateral recesses are sufficiently patent. There is chronic mild to moderate right and severe left foraminal narrowing which has slightly progressed. IMPRESSION: 1. No acute findings or clear explanation for the patient's symptoms. No evidence of metastatic disease within the lumbar spine. 2. Multilevel spondylosis has progressed from remote MRI from 2018. Associated discal T2 hyperintensity at the lower 3 levels appears degenerative. No evidence of discitis/osteomyelitis. 3. At L3-4, there is mild to moderate multifactorial spinal stenosis with increasing mild to moderate left greater than right lateral recess and foraminal narrowing. 4. Mild multifactorial spinal stenosis at L4-5 with mildly increased narrowing of the left lateral recess. 5. Slightly progressive chronic mild to moderate right and severe left foraminal narrowing at L5-S1. Electronically Signed   By: Carey Bullocks M.D.   On: 08/24/2023 13:32   VAS US CAROTID  Result Date: 08/23/2023 Carotid Arterial Duplex Study Patient Name:  BREONAH RUGGLES  Date of Exam:   08/22/2023 Medical Rec #: [id number]          Accession #:    [id number] Date of Birth: [birth year]          Patient Gender: F Patient Age:   65 years Exam Location:  [facility] Procedure:      VAS US CAROTID Referring Phys: [facility] --------------------------------------------------------------------------------  Indications:       TIA. Risk Factors:      Hypertension, hyperlipidemia, no history of smoking. Other Factors:     Afib, PM. Limitations        Today's exam was limited due to the body habitus of the                    patient and tortuous vessels. Comparison Study:  No previous exams Performing Technologist: Jody Hill RVT, RDMS  Examination Guidelines: A complete evaluation includes B-mode imaging, spectral Doppler, color Doppler, and power Doppler as needed of all accessible portions of each  vessel. Bilateral testing is considered an integral part of a complete examination. Limited examinations for reoccurring indications may be performed as noted.  Right Carotid Findings: +----------+--------+--------+--------+------------------+--------+           PSV cm/sEDV cm/sStenosisPlaque DescriptionComments +----------+--------+--------+--------+------------------+--------+ CCA Prox  76      12                                tortuous +----------+--------+--------+--------+------------------+--------+ CCA Distal89      15                                         +----------+--------+--------+--------+------------------+--------+  Mild bilateral facet hypertrophy. No spinal stenosis or foraminal narrowing. L2-3: Stable mild loss of disc height with mild disc bulging and mild bilateral facet hypertrophy. No significant spinal stenosis or foraminal narrowing. L3-4: Progressive loss of disc height with annular disc bulging and endplate osteophytes compared with remote MRI. There is mild facet and ligamentous hypertrophy. Mild to moderate multifactorial spinal stenosis does not appear significantly changed. However, there is increasing mild to moderate left greater than right lateral recess and foraminal narrowing. L4-5: Chronic loss of disc height with annular disc bulging and endplate osteophytes, similar to remote MRI. Stable mild multifactorial spinal stenosis. There is mildly increased narrowing of the left lateral recess. Mild foraminal narrowing bilaterally has not significantly progressed. L5-S1: Chronic loss of disc height with annular disc bulging and endplate osteophytes asymmetric to the left, similar to  remote MRI. Mild facet and ligamentous hypertrophy. The spinal canal and lateral recesses are sufficiently patent. There is chronic mild to moderate right and severe left foraminal narrowing which has slightly progressed. IMPRESSION: 1. No acute findings or clear explanation for the patient's symptoms. No evidence of metastatic disease within the lumbar spine. 2. Multilevel spondylosis has progressed from remote MRI from 2018. Associated discal T2 hyperintensity at the lower 3 levels appears degenerative. No evidence of discitis/osteomyelitis. 3. At L3-4, there is mild to moderate multifactorial spinal stenosis with increasing mild to moderate left greater than right lateral recess and foraminal narrowing. 4. Mild multifactorial spinal stenosis at L4-5 with mildly increased narrowing of the left lateral recess. 5. Slightly progressive chronic mild to moderate right and severe left foraminal narrowing at L5-S1. Electronically Signed   By: Carey Bullocks M.D.   On: 08/24/2023 13:32   VAS US CAROTID  Result Date: 08/23/2023 Carotid Arterial Duplex Study Patient Name:  BREONAH RUGGLES  Date of Exam:   08/22/2023 Medical Rec #: [id number]          Accession #:    [id number] Date of Birth: [birth year]          Patient Gender: F Patient Age:   65 years Exam Location:  [facility] Procedure:      VAS US CAROTID Referring Phys: Bess Harvest [facility] --------------------------------------------------------------------------------  Indications:       TIA. Risk Factors:      Hypertension, hyperlipidemia, no history of smoking. Other Factors:     Afib, PM. Limitations        Today's exam was limited due to the body habitus of the                    patient and tortuous vessels. Comparison Study:  No previous exams Performing Technologist: Jody Hill RVT, RDMS  Examination Guidelines: A complete evaluation includes B-mode imaging, spectral Doppler, color Doppler, and power Doppler as needed of all accessible portions of each  vessel. Bilateral testing is considered an integral part of a complete examination. Limited examinations for reoccurring indications may be performed as noted.  Right Carotid Findings: +----------+--------+--------+--------+------------------+--------+           PSV cm/sEDV cm/sStenosisPlaque DescriptionComments +----------+--------+--------+--------+------------------+--------+ CCA Prox  76      12                                tortuous +----------+--------+--------+--------+------------------+--------+ CCA Distal89      15                                         +----------+--------+--------+--------+------------------+--------+  PROGRESS NOTE                                                                                                                                                                                                             Patient Demographics:    Jaziel Gasparian, is a 77 y.o. female, DOB - 1946/07/11, ZOX:[id number]  Outpatient Primary MD for the patient is Plotnikov, Georgina Quint, MD    LOS - 6  Admit date - 08/20/2023    Chief Complaint  Patient presents with   Fall       Brief Narrative (HPI from H&P)     52 Russian-speaking female with history of permanent A-fib on anticoagulation, status post pacemaker, decompensated liver cirrhosis, CKD stage 2 hypertension, hyperlipidemia, hypothyroidism, chronic lymphedema, history of breast cancer status post radiation therapy, recent trauma with fracture of left upper and lower extremities, wheelchair dependent who was brought from home after a level 2 trauma following ground-level fall.  She lives with her son.  Patient noted right-sided numbness involving her right face, arm and leg with left-sided weakness before fall.  Suspected stroke on admission but MRI of the brain and cervical spine did not show any acute findings.  She was admitted for further workup, it was significant for UTI.   Subjective:    [name] today with no significant events overnight, no nausea, no vomiting, he does report some sore throat, as well she had BM this morning, was normal in color, but Hemoccult positive.   Assessment  & Plan :   Fall/strokelike symptoms:  - Fell at home.  Developed right-sided numbness/weakness - multiple falls over the last 1 mth. Negative MRI of the brain, cervical spine did not show any acute findings but showed mild C5-6 spinal canal stenosis,moderate bilateral C3-4 and C4-5 neural foraminal stenosis.  Her skeletal survey did not show any acute findings.  PT/OT  consulted. -Right CT head with no acute finding as well -MRI lumbar spine difficult for radiculopathy, but no significant findings to explain right side weakness - Patient has severe weakness and deconditioning at baseline.  Recent hospitalization from  07/10 to 05/24/23 for mechanical fall with left patellar and left distal clavicle fracture.  Recently had surgery of the right shoulder.  Cannot move right shoulder, sided range of motion and strength improving, uses wheelchair at home,  for measurements and observations.  Electronically signed by Delia Heady MD on 08/23/2023 at 6:06:50 PM.    Final    ECHOCARDIOGRAM COMPLETE  Result Date: 08/23/2023    ECHOCARDIOGRAM REPORT   Patient Name:   Katelyn Jackson Date of Exam: 08/23/2023 Medical Rec #:  [id number]         Height:       61.0 in Accession #:    [id number]        Weight:       202.4 lb Date of Birth:  01-07-1946         BSA:          1.898 m Patient Age:    77 years          BP:           133/63 mmHg Patient  Gender: F                 HR:           66 bpm. Exam Location:  Inpatient Procedure: 2D Echo, Cardiac Doppler and Color Doppler Indications:    CHF I50.31  History:        Patient has prior history of Echocardiogram examinations, most                 recent 03/19/2021. CHF, Pacemaker, Pulmonary HTN,                 Arrythmias:Atrial Fibrillation; Risk Factors:Hypertension,                 Dyslipidemia and Non-Smoker.  Sonographer:    Dondra Prader RVT RCS Referring Phys: [id number] [facility] [place] IMPRESSIONS  1. Left ventricular ejection fraction, by estimation, is 40 to 45%. The left ventricle has mildly decreased function. The left ventricle demonstrates global hypokinesis. The left ventricular internal cavity size was mildly to moderately dilated. Left ventricular diastolic function could not be evaluated.  2. Right ventricular systolic function is moderately reduced. The right ventricular size is moderately enlarged. There is mildly elevated pulmonary artery systolic pressure. The estimated right ventricular systolic pressure is 36.7 mmHg.  3. Left atrial size was severely dilated.  4. Appears to be a PFO with L to R shunting. Evidence of atrial level shunting detected by color flow Doppler.  5. Right atrial size was severely dilated.  6. The mitral valve is abnormal. Severe mitral valve regurgitation. No evidence of mitral stenosis.  7. Tricuspid valve regurgitation is mild to moderate.  8. The aortic valve is tricuspid. Aortic valve regurgitation is trivial. No aortic stenosis is present.  9. The inferior vena cava is dilated in size with <50% respiratory variability, suggesting right atrial pressure of 15 mmHg. Comparison(s): No significant change from prior study. FINDINGS  Left Ventricle: Left ventricular ejection fraction, by estimation, is 40 to 45%. The left ventricle has mildly decreased function. The left ventricle demonstrates global hypokinesis. The left ventricular internal cavity size was mildly to  moderately dilated. There is no left ventricular hypertrophy. Left ventricular diastolic function could not be evaluated due to atrial fibrillation. Left ventricular diastolic function could not be evaluated. Right Ventricle: The right ventricular size is moderately enlarged. No increase in right ventricular wall thickness. Right ventricular systolic function is moderately reduced. There is mildly elevated pulmonary artery systolic pressure. The tricuspid regurgitant velocity is 2.33 m/s, and with an assumed right atrial pressure of 15 mmHg, the estimated right ventricular systolic pressure is 36.7 mmHg. Left Atrium: Left atrial  Mild bilateral facet hypertrophy. No spinal stenosis or foraminal narrowing. L2-3: Stable mild loss of disc height with mild disc bulging and mild bilateral facet hypertrophy. No significant spinal stenosis or foraminal narrowing. L3-4: Progressive loss of disc height with annular disc bulging and endplate osteophytes compared with remote MRI. There is mild facet and ligamentous hypertrophy. Mild to moderate multifactorial spinal stenosis does not appear significantly changed. However, there is increasing mild to moderate left greater than right lateral recess and foraminal narrowing. L4-5: Chronic loss of disc height with annular disc bulging and endplate osteophytes, similar to remote MRI. Stable mild multifactorial spinal stenosis. There is mildly increased narrowing of the left lateral recess. Mild foraminal narrowing bilaterally has not significantly progressed. L5-S1: Chronic loss of disc height with annular disc bulging and endplate osteophytes asymmetric to the left, similar to  remote MRI. Mild facet and ligamentous hypertrophy. The spinal canal and lateral recesses are sufficiently patent. There is chronic mild to moderate right and severe left foraminal narrowing which has slightly progressed. IMPRESSION: 1. No acute findings or clear explanation for the patient's symptoms. No evidence of metastatic disease within the lumbar spine. 2. Multilevel spondylosis has progressed from remote MRI from 2018. Associated discal T2 hyperintensity at the lower 3 levels appears degenerative. No evidence of discitis/osteomyelitis. 3. At L3-4, there is mild to moderate multifactorial spinal stenosis with increasing mild to moderate left greater than right lateral recess and foraminal narrowing. 4. Mild multifactorial spinal stenosis at L4-5 with mildly increased narrowing of the left lateral recess. 5. Slightly progressive chronic mild to moderate right and severe left foraminal narrowing at L5-S1. Electronically Signed   By: Carey Bullocks M.D.   On: 08/24/2023 13:32   VAS US CAROTID  Result Date: 08/23/2023 Carotid Arterial Duplex Study Patient Name:  BREONAH RUGGLES  Date of Exam:   08/22/2023 Medical Rec #: [id number]          Accession #:    [id number] Date of Birth: [birth year]          Patient Gender: F Patient Age:   65 years Exam Location:  [facility] Procedure:      VAS US CAROTID Referring Phys: Bess Harvest [facility] --------------------------------------------------------------------------------  Indications:       TIA. Risk Factors:      Hypertension, hyperlipidemia, no history of smoking. Other Factors:     Afib, PM. Limitations        Today's exam was limited due to the body habitus of the                    patient and tortuous vessels. Comparison Study:  No previous exams Performing Technologist: Jody Hill RVT, RDMS  Examination Guidelines: A complete evaluation includes B-mode imaging, spectral Doppler, color Doppler, and power Doppler as needed of all accessible portions of each  vessel. Bilateral testing is considered an integral part of a complete examination. Limited examinations for reoccurring indications may be performed as noted.  Right Carotid Findings: +----------+--------+--------+--------+------------------+--------+           PSV cm/sEDV cm/sStenosisPlaque DescriptionComments +----------+--------+--------+--------+------------------+--------+ CCA Prox  76      12                                tortuous +----------+--------+--------+--------+------------------+--------+ CCA Distal89      15                                         +----------+--------+--------+--------+------------------+--------+

## 2023-08-26 NOTE — Consult Note (Signed)
[facility] Consult  Referring Provider: [facility] hospitalist/Dr. Elgergawy Primary Care Physician:  Tresa Garter, MD Primary Gastroenterologist: Dr. Presley Raddle [facility]  Reason for Consultation: Anemia  HPI: Katelyn Jackson is a 77 y.o. female who was admitted on 910/13/24 after a fall. GI was consulted for hemoglobin, occult blood in stool and need to resume blood thinner Eliquis for atrial fibrillation. She was last seen in office on 03/28/2022, when she was referred for cirrhosis diagnosed on ultrasound from 9/22. Her workup for chronic liver disease was negative, and presumed to be likely related to NASH. As per documentation she had a colonoscopy [place] more than 15 years ago. Labs on 5/23 showed normal alpha-fetoprotein, PT 13.5, INR 1.3, BUN 36, creatinine 1.5, GFR 36, T. bili 1.7/AST 22/ALT 13/ALP 151, hemoglobin 10.3, platelet 115 CBC with PCP 07/04/2022 showed hemoglobin 9.1 CT abdomen pelvis with contrast 06/14/2023: Large volume stool within rectal vault with mild perirectal inflammatory stranding and edema suggestive of stercoral proctitis CT abdomen pelvis with contrast 06/09/2023: Large volume of stool throughout colon with rectum measuring 9 x 8.3 cm, perirectal edema and inflammation suggesting fecal impaction, stercoral colitis cannot be excluded, moderate hiatal hernia.  Patient denies nausea, vomiting, states she normally has 2 bowel movements a day, and denies noticing blood in stools or black stools. She denies acid reflux, heartburn, difficulty swallowing or pain on swallowing. She denies unintentional weight loss or loss of appetite. She is mostly wheelchair dependent.  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 coli carrier 09/22/2021   GERD (gastroesophageal reflux disease)    Headache    Herpes labialis 08/19/2021   Hyperlipidemia    Hypertension    Hypothyroidism    Lymphedema    Moderate to severe pulmonary hypertension (HCC)    Obesity    Osteoarthritis of right knee 08/19/2021   Personal history of radiation therapy    Pneumonia 12/2020   Presence of permanent cardiac pacemaker 01/21/2021   for bradycardia    Syncope 12/2020   needed a pacemaker    Past Surgical History:  Procedure Laterality Date   APPENDECTOMY     BREAST BIOPSY Left 2018   BREAST LUMPECTOMY Left 04/04/2017   x3   BREAST LUMPECTOMY WITH NEEDLE LOCALIZATION AND AXILLARY SENTINEL LYMPH NODE BX Left 04/04/2017   Procedure: BREAST LUMPECTOMY WITH NEEDLE LOCALIZATION x3 AND AXILLARY SENTINEL LYMPH NODE BX;  Surgeon: Almond Lint, MD;  Location: [facility] OR;  Service: General;  Laterality: Left;   CATARACT EXTRACTION W/ INTRAOCULAR LENS  IMPLANT, BILATERAL Bilateral 2018   COLONOSCOPY     EYE SURGERY     GLAUCOMA SURGERY Bilateral 2018   HARDWARE REMOVAL Right 07/26/2021   Procedure: RIGHT SHOULDER HARDWARE REMOVAL WITH WASHOUT;  Surgeon: Bjorn Pippin, MD;  Location: [facility] ORS;  Service: Orthopedics;  Laterality: Right;   INSERT / REPLACE / REMOVE PACEMAKER  01/21/2021   IR FLUORO GUIDE CV LINE LEFT  07/29/2021   IR REMOVAL TUN CV CATH W/O FL  09/07/2021   IR US GUIDE BX ASP/DRAIN  07/16/2021   RE-EXCISION OF BREAST CANCER,SUPERIOR  MARGINS Left 04/26/2017   Procedure: RE-EXCISION OF LEFT BREAST CANCER;  Surgeon: Almond Lint, MD;  Location: [facility] OR;  Service: General;  Laterality: Left;   SHOULDER HEMI-ARTHROPLASTY Right 03/17/2021   Procedure: SHOULDER HEMI-ARTHROPLASTY;  Surgeon: Bjorn Pippin, MD;  Location: [facility];  Service: Orthopedics;  Laterality: Right;    Prior to Admission medications   Medication Sig Start Date End Date Taking?  Authorizing Provider  allopurinol (ZYLOPRIM) 100 MG tablet Take 0.5 tablets (50 mg total) by mouth daily. 11/23/22  Yes Plotnikov, Georgina Quint, MD  apixaban (ELIQUIS) 5 MG TABS tablet Take 1 tablet (5 mg total) by mouth 2 (two) times daily. 11/23/22  Yes Plotnikov, Georgina Quint, MD  carvedilol (COREG) 12.5 MG tablet Take 12.5 mg by mouth 2 (two) times daily with a meal.   Yes [provider]  lactulose (CHRONULAC) 10 GM/15ML solution TAKE 15 MILLILITERS BY MOUTH THREE TIMESDAILY Patient taking differently: Take 10 g by mouth 3 (three) times daily. 02/10/23  Yes Plotnikov, Georgina Quint, MD  levothyroxine (SYNTHROID) 50 MCG tablet Take 1 tablet (50 mcg total) by mouth daily before breakfast. Overdue for Annual appt must see provider for future refills 11/23/22  Yes Plotnikov, Georgina Quint, MD  lovastatin (MEVACOR) 20 MG tablet Take 20 mg by mouth at bedtime.   Yes [provider]  magnesium oxide (MAG-OX) 400 MG tablet Take 1 tablet (400 mg total) by mouth 2 (two) times daily. 09/15/22  Yes Plotnikov, Georgina Quint, MD  Menthol-Zinc Oxide (CALMOSEPTINE) 0.44-20.6 % OINT Apply 1 application  topically in the morning and at bedtime.   Yes [provider]  Nystatin (GERHARDT'S BUTT CREAM) CREA Apply 1 Application topically 2 (two) times daily. 06/15/23  Yes Azucena Fallen, MD  Olopatadine HCl 0.2 % SOLN Place 1 drop into the right eye in the morning and at bedtime.   Yes [provider]  omeprazole (PRILOSEC) 40 MG capsule TAKE ONE CAPSULE BY MOUTH DAILY Patient taking differently: Take 40 mg by mouth daily before breakfast. 08/16/22  Yes Plotnikov, Georgina Quint, MD  polyethylene glycol (MIRALAX / GLYCOLAX) 17 g packet Take 17 g by mouth 2 (two) times daily. 06/15/23  Yes Azucena Fallen, MD  potassium chloride (KLOR-CON) 8 MEQ tablet TAKE ONE (1) TABLET BY MOUTH EVERY DAY 11/30/22  Yes Plotnikov, Georgina Quint, MD  protein supplement (PROSOURCE NO CARB) LIQD Take 30 mLs by mouth 2 (two)  times daily.   Yes [provider]  rifaximin (XIFAXAN) 550 MG TABS tablet Take 1 tablet (550 mg total) by mouth 2 (two) times daily. 06/16/22  Yes Plotnikov, Georgina Quint, MD  sennosides-docusate sodium (SENOKOT-S) 8.6-50 MG tablet Take 2 tablets by mouth every 12 (twelve) hours.   Yes [provider]  spironolactone (ALDACTONE) 25 MG tablet Take 25 mg by mouth daily.   Yes [provider]  torsemide (DEMADEX) 100 MG tablet Take 1 tablet (100 mg total) by mouth 2 (two) times daily. Patient taking differently: Take 100 mg by mouth 2 (two) times daily. 11/25/21  Yes Plotnikov, Georgina Quint, MD  triamcinolone cream (KENALOG) 0.1 % Apply topically 2 (two) times daily. Patient taking differently: Apply 1 Application topically 2 (two) times daily. To dry patches on BLE and bilateral feet 02/01/23  Yes Plotnikov, Georgina Quint, MD  bisacodyl (DULCOLAX) 10 MG suppository Place 10 mg rectally as needed for moderate constipation. Patient not taking: Reported on 08/21/2023    [provider]    Current Facility-Administered Medications  Medication Dose Route Frequency Provider Last Rate Last Admin   acetaminophen (TYLENOL) tablet 1,000 mg  1,000 mg Oral Q6H PRN Dolly Rias, MD   1,000 mg at 08/25/23 2049   allopurinol (ZYLOPRIM) tablet 50 mg  50 mg Oral Daily Segars, Christiane Ha, MD   50 mg at 08/26/23 1047   cyanocobalamin (VITAMIN B12) injection 1,000 mcg  1,000 mcg Subcutaneous Daily Elgergawy, Leana Roe, MD   1,000 mcg at 08/26/23 1047   Followed by   Melene Muller ON 08/30/2023] cyanocobalamin (VITAMIN B12) injection 1,000 mcg  1,000 mcg Subcutaneous Weekly Elgergawy, Leana Roe, MD       ezetimibe (ZETIA) tablet 10 mg  10 mg Oral Daily Leroy Sea, MD   10 mg at 08/26/23 1047   lactulose (CHRONULAC) 10 GM/15ML solution 10 g  10 g Oral TID Dolly Rias, MD   10 g at 08/25/23 2033   levothyroxine (SYNTHROID) tablet 50 mcg  50 mcg Oral Q0600 Dolly Rias, MD   50 mcg at  08/26/23 0517   magnesium citrate solution 0.5 Bottle  0.5 Bottle Oral Once PRN Elgergawy, Leana Roe, MD       magnesium citrate solution 1 Bottle  1 Bottle Oral Once Elgergawy, Leana Roe, MD       meropenem (MERREM) 1 g in sodium chloride 0.9 % 100 mL IVPB  1 g Intravenous Q12H Pham, Minh Q, RPH-CPP 200 mL/hr at 08/26/23 1102 1 g at 08/26/23 1102   metoprolol tartrate (LOPRESSOR) injection 5 mg  5 mg Intravenous Q8H PRN Leroy Sea, MD       oxyCODONE (Oxy IR/ROXICODONE) immediate release tablet 2.5 mg  2.5 mg Oral Q6H PRN Dolly Rias, MD   2.5 mg at 08/25/23 0542   pantoprazole (PROTONIX) EC tablet 40 mg  40 mg Oral Daily Segars, Christiane Ha, MD   40 mg at 08/26/23 1047   polyethylene glycol (MIRALAX / GLYCOLAX) packet 17 g  17 g Oral BID PRN Dolly Rias, MD   17 g at 08/25/23 0918   pravastatin (PRAVACHOL) tablet 20 mg  20 mg Oral q1800 Dolly Rias, MD   20 mg at 08/25/23 1823   rifaximin (XIFAXAN) tablet 550 mg  550 mg Oral BID Dolly Rias, MD   550 mg at 08/26/23 1047    Allergies as of 08/20/2023 - Review Complete 08/20/2023  Allergen Reaction Noted   Hibiclens [chlorhexidine gluconate] Hives 04/26/2017   Penicillins Itching 04/05/2016    Family History  Problem Relation Age of Onset   CAD Mother 24   Stroke Mother 68       hemorr CVA   COPD Father 30   Cancer Neg Hx     Social History   Socioeconomic History   Marital status: Widowed    Spouse name: Not on file   Number of children: 1   Years of education: Not on file   Highest education level: Not on file  Occupational History   Not on file  Tobacco Use   Smoking status: Never   Smokeless tobacco: Never  Vaping Use   Vaping status: Never Used  Substance and Sexual Activity   Alcohol use: No    Alcohol/week: 0.0 standard drinks of alcohol   Drug use: No   Sexual activity: Not on file  Other Topics Concern   Not on file  Social History Narrative   Not on file   Social Determinants of  Health   Financial Resource Strain: Low Risk  (06/15/2022)   Overall  Financial Resource Strain (CARDIA)    Difficulty of Paying Living Expenses: Not hard at all  Food Insecurity: No Food Insecurity (08/21/2023)   Hunger Vital Sign    Worried About Running Out of Food in the Last Year: Never true    Ran Out of Food in the Last Year: Never true  Transportation Needs: No Transportation Needs (08/21/2023)   PRAPARE - Administrator, Civil Service (Medical): No    Lack of Transportation (Non-Medical): No  Physical Activity: Inactive (06/15/2022)   Exercise Vital Sign    Days of Exercise per Week: 0 days    Minutes of Exercise per Session: 0 min  Stress: No Stress Concern Present (06/15/2022)   [employer] of Occupational Health - Occupational Stress Questionnaire    Feeling of Stress : Not at all  Social Connections: Socially Isolated (06/15/2022)   Social Connection and Isolation Panel [NHANES]    Frequency of Communication with Friends and Family: More than three times a week    Frequency of Social Gatherings with Friends and Family: Once a week    Attends Religious Services: Never    Database administrator or Organizations: No    Attends Banker Meetings: Never    Marital Status: Widowed  Intimate Partner Violence: Not At Risk (08/21/2023)   Humiliation, Afraid, Rape, and Kick questionnaire    Fear of Current or Ex-Partner: No    Emotionally Abused: No    Physically Abused: No    Sexually Abused: No    Review of Systems: As per HPI  Physical Exam: Vital signs in last 24 hours: Temp:  [97.9 F (36.6 C)-98.6 F (37 C)] 98.6 F (37 C) (10/19 0800) Pulse Rate:  [60-66] 60 (10/19 0800) Resp:  [17-22] 21 (10/19 0800) BP: (114-139)/(56-102) 114/102 (10/19 0800) SpO2:  [95 %-100 %] 98 % (10/19 0400) Last BM Date : 08/24/23  General:   Alert,  Well-developed, well-nourished, pleasant and cooperative in NAD Head:  Normocephalic and atraumatic. Eyes:   Sclera clear, no icterus.  Mild pallor Ears:  Normal auditory acuity. Nose:  No deformity, discharge,  or lesions. Mouth:  No deformity or lesions.  Oropharynx pink & moist. Neck:  Supple; no masses or thyromegaly. Lungs:  Clear throughout to auscultation.   No wheezes, crackles, or rhonchi. No acute distress. Heart:  Regular rate and rhythm; no murmurs, clicks, rubs,  or gallops. Extremities:  Without clubbing or edema. Neurologic:  Alert and  oriented x4;  grossly normal neurologically. Skin:  Intact without significant lesions or rashes. Psych:  Alert and cooperative. Normal mood and affect. Abdomen:  Soft, nontender and nondistended. No masses, hepatosplenomegaly or hernias noted. Normal bowel sounds, without guarding, and without rebound.         Lab Results: Recent Labs    08/24/23 0458 08/25/23 0254 08/26/23 0438  WBC 4.0 4.0 4.0  HGB 7.8* 7.3* 8.0*  HCT 25.1* 24.0* 26.3*  PLT 137* 147* 148*   BMET Recent Labs    08/24/23 0458 08/25/23 0254 08/26/23 0438  NA 141 139 141  K 3.6 3.8 3.8  CL 106 109 106  CO2 25 24 22   GLUCOSE 87 97 88  BUN 20 18 14   CREATININE 1.38* 1.29* 1.26*  CALCIUM 9.7 9.2 10.3   LFT No results for input(s): "PROT", "ALBUMIN", "AST", "ALT", "ALKPHOS", "BILITOT", "BILIDIR", "IBILI" in the last 72 hours. PT/INR No results for input(s): "LABPROT", "INR" in the last 72 hours.  Studies/Results: No results found.  Impression: Anemia without obvious melena or hematochezia Hemoglobin trend: 7.9/7.4/7.7/7.8/7.3/8 FOBT positive  History of cirrhosis  Without signs of ascites,  ? History of encephalopathy , currently on lactulose and rifaximin No history of variceal bleed MELD sodium of 12  No prior EGD Patient states last colonoscopy was over 15 years ago  Plan: Recommend diagnostic EGD in a.m. to evaluate for esophageal varices and portal hypertensive gastropathy given history of cirrhosis. Patient refusing to undergo  colonoscopy. She has had several CAT scans which have not shown any suspicious colonic lesions. Will keep patient n.p.o. postmidnight. She is currently on pantoprazole 40 mg once a day.   LOS: 6 days   Kerin Salen, MD  08/26/2023, 11:23 AM

## 2023-08-26 NOTE — Plan of Care (Signed)
  Problem: Education: Goal: Knowledge of General Education information will improve Description: Including pain rating scale, medication(s)/side effects and non-pharmacologic comfort measures 08/26/2023 0134 by Verdell Face, RN Outcome: Progressing 08/26/2023 0134 by Verdell Face, RN Outcome: Progressing   Problem: Health Behavior/Discharge Planning: Goal: Ability to manage health-related needs will improve 08/26/2023 0134 by Verdell Face, RN Outcome: Progressing 08/26/2023 0134 by Verdell Face, RN Outcome: Progressing   Problem: Clinical Measurements: Goal: Ability to maintain clinical measurements within normal limits will improve 08/26/2023 0134 by Verdell Face, RN Outcome: Progressing 08/26/2023 0134 by Verdell Face, RN Outcome: Progressing Goal: Will remain free from infection Outcome: Progressing   Problem: Activity: Goal: Risk for activity intolerance will decrease 08/26/2023 0134 by Verdell Face, RN Outcome: Progressing 08/26/2023 0134 by Verdell Face, RN Outcome: Progressing   Problem: Nutrition: Goal: Adequate nutrition will be maintained 08/26/2023 0134 by Verdell Face, RN Outcome: Progressing 08/26/2023 0134 by Verdell Face, RN Outcome: Progressing   Problem: Coping: Goal: Level of anxiety will decrease 08/26/2023 0134 by Verdell Face, RN Outcome: Progressing 08/26/2023 0134 by Verdell Face, RN Outcome: Progressing   Problem: Elimination: Goal: Will not experience complications related to bowel motility 08/26/2023 0134 by Verdell Face, RN Outcome: Progressing 08/26/2023 0134 by Verdell Face, RN Outcome: Progressing   Problem: Pain Managment: Goal: General experience of comfort will improve 08/26/2023 0134 by Verdell Face, RN Outcome: Progressing 08/26/2023 0134 by Verdell Face, RN Outcome: Progressing   Problem: Safety: Goal: Ability  to remain free from injury will improve 08/26/2023 0134 by Verdell Face, RN Outcome: Progressing 08/26/2023 0134 by Verdell Face, RN Outcome: Progressing   Problem: Skin Integrity: Goal: Risk for impaired skin integrity will decrease 08/26/2023 0134 by Verdell Face, RN Outcome: Progressing 08/26/2023 0134 by Verdell Face, RN Outcome: Progressing

## 2023-08-27 ENCOUNTER — Inpatient Hospital Stay (HOSPITAL_COMMUNITY): Payer: Medicare HMO | Admitting: Certified Registered Nurse Anesthetist

## 2023-08-27 ENCOUNTER — Encounter (HOSPITAL_COMMUNITY)
Admission: EM | Disposition: A | Discharge status: Skilled Nursing Facility | Payer: Self-pay | Source: Home / Self Care | Attending: Internal Medicine

## 2023-08-27 ENCOUNTER — Encounter (HOSPITAL_COMMUNITY): Payer: Self-pay | Admitting: Internal Medicine

## 2023-08-27 DIAGNOSIS — E039 Hypothyroidism, unspecified: Secondary | ICD-10-CM | POA: Diagnosis not present

## 2023-08-27 DIAGNOSIS — B9629 Other Escherichia coli [E. coli] as the cause of diseases classified elsewhere: Secondary | ICD-10-CM | POA: Diagnosis not present

## 2023-08-27 DIAGNOSIS — D500 Iron deficiency anemia secondary to blood loss (chronic): Secondary | ICD-10-CM

## 2023-08-27 DIAGNOSIS — K3189 Other diseases of stomach and duodenum: Secondary | ICD-10-CM

## 2023-08-27 DIAGNOSIS — W19XXXS Unspecified fall, sequela: Secondary | ICD-10-CM | POA: Diagnosis not present

## 2023-08-27 DIAGNOSIS — R296 Repeated falls: Secondary | ICD-10-CM | POA: Diagnosis not present

## 2023-08-27 DIAGNOSIS — D649 Anemia, unspecified: Secondary | ICD-10-CM | POA: Diagnosis not present

## 2023-08-27 HISTORY — PX: BIOPSY: SHX5522

## 2023-08-27 HISTORY — PX: ESOPHAGOGASTRODUODENOSCOPY (EGD) WITH PROPOFOL: SHX5813

## 2023-08-27 LAB — CBC
HCT: 25.7 % — ABNORMAL LOW (ref 36.0–46.0)
Hemoglobin: 7.8 g/dL — ABNORMAL LOW (ref 12.0–15.0)
MCH: 30 pg (ref 26.0–34.0)
MCHC: 30.4 g/dL (ref 30.0–36.0)
MCV: 98.8 fL (ref 80.0–100.0)
Platelets: 153 10*3/uL (ref 150–400)
RBC: 2.6 MIL/uL — ABNORMAL LOW (ref 3.87–5.11)
RDW: 13.9 % (ref 11.5–15.5)
WBC: 4.2 10*3/uL (ref 4.0–10.5)
nRBC: 0 % (ref 0.0–0.2)

## 2023-08-27 LAB — BASIC METABOLIC PANEL
Anion gap: 11 (ref 5–15)
BUN: 13 mg/dL (ref 8–23)
CO2: 21 mmol/L — ABNORMAL LOW (ref 22–32)
Calcium: 10.1 mg/dL (ref 8.9–10.3)
Chloride: 108 mmol/L (ref 98–111)
Creatinine, Ser: 1.14 mg/dL — ABNORMAL HIGH (ref 0.44–1.00)
GFR, Estimated: 50 mL/min — ABNORMAL LOW (ref >60)
Glucose, Bld: 82 mg/dL (ref 70–99)
Potassium: 4 mmol/L (ref 3.5–5.1)
Sodium: 140 mmol/L (ref 135–145)

## 2023-08-27 SURGERY — ESOPHAGOGASTRODUODENOSCOPY (EGD) WITH PROPOFOL
Anesthesia: Monitor Anesthesia Care

## 2023-08-27 MED ORDER — LIDOCAINE 2% (20 MG/ML) 5 ML SYRINGE
INTRAMUSCULAR | Status: DC | PRN
  Administered 2023-08-27: 60 mg via INTRAVENOUS

## 2023-08-27 MED ORDER — PROPOFOL 10 MG/ML IV BOLUS
INTRAVENOUS | Status: DC | PRN
  Administered 2023-08-27 (×3): 40 mg via INTRAVENOUS
  Administered 2023-08-27: 30 mg via INTRAVENOUS

## 2023-08-27 MED ORDER — SODIUM CHLORIDE 0.9 % IV SOLN: INTRAVENOUS | Status: DC

## 2023-08-27 SURGICAL SUPPLY — 15 items

## 2023-08-27 NOTE — Progress Notes (Signed)
superior endplate compression deformity at T12. The visualized sacroiliac joints appear unremarkable. Conus medullaris: Extends to the L1-2 level and appears normal. Paraspinal and other soft tissues: No significant paraspinal findings. Disc levels: Sagittal images demonstrate stable degenerative changes in the visualized lower thoracic spine without resulting spinal stenosis or nerve root encroachment. L1-2: Preserved disc height and hydration. Mild bilateral facet hypertrophy. No spinal stenosis or foraminal narrowing. L2-3: Stable mild loss of disc height with mild disc bulging and mild bilateral facet hypertrophy. No significant spinal stenosis or foraminal narrowing. L3-4: Progressive loss of disc height with annular disc bulging and endplate osteophytes compared with remote MRI. There is mild facet and ligamentous hypertrophy. Mild to moderate multifactorial spinal stenosis does not appear significantly changed. However, there is increasing mild to moderate left greater than right lateral recess and foraminal narrowing. L4-5: Chronic loss of disc height with annular disc bulging and endplate osteophytes, similar to remote MRI. Stable mild multifactorial spinal stenosis. There is mildly increased narrowing of the left lateral recess. Mild foraminal narrowing bilaterally has not significantly progressed. L5-S1: Chronic loss of disc height with annular disc bulging and endplate osteophytes asymmetric to the left, similar to remote MRI. Mild facet and ligamentous hypertrophy. The spinal canal and lateral  recesses are sufficiently patent. There is chronic mild to moderate right and severe left foraminal narrowing which has slightly progressed. IMPRESSION: 1. No acute findings or clear explanation for the patient's symptoms. No evidence of metastatic disease within the lumbar spine. 2. Multilevel spondylosis has progressed from remote MRI from 2018. Associated discal T2 hyperintensity at the lower 3 levels appears degenerative. No evidence of discitis/osteomyelitis. 3. At L3-4, there is mild to moderate multifactorial spinal stenosis with increasing mild to moderate left greater than right lateral recess and foraminal narrowing. 4. Mild multifactorial spinal stenosis at L4-5 with mildly increased narrowing of the left lateral recess. 5. Slightly progressive chronic mild to moderate right and severe left foraminal narrowing at L5-S1. Electronically Signed   By: Carey Bullocks M.D.   On: 08/24/2023 13:32   ECHOCARDIOGRAM COMPLETE  Result Date: 08/23/2023    ECHOCARDIOGRAM REPORT   Patient Name:   Katelyn Jackson Date of Exam: 08/23/2023 Medical Rec #:  [id number]         Height:       61.0 in Accession #:    [id number]        Weight:       202.4 lb Date of Birth:  Aug 24, 1946         BSA:          1.898 m Patient Age:    77 years          BP:           133/63 mmHg Patient Gender: F                 HR:           66 bpm. Exam Location:  Inpatient Procedure: 2D Echo, Cardiac Doppler and Color Doppler Indications:    CHF I50.31  History:        Patient has prior history of Echocardiogram examinations, most                 recent 03/19/2021. CHF, Pacemaker, Pulmonary HTN,                 Arrythmias:Atrial Fibrillation; Risk Factors:Hypertension,                 Dyslipidemia  superior endplate compression deformity at T12. The visualized sacroiliac joints appear unremarkable. Conus medullaris: Extends to the L1-2 level and appears normal. Paraspinal and other soft tissues: No significant paraspinal findings. Disc levels: Sagittal images demonstrate stable degenerative changes in the visualized lower thoracic spine without resulting spinal stenosis or nerve root encroachment. L1-2: Preserved disc height and hydration. Mild bilateral facet hypertrophy. No spinal stenosis or foraminal narrowing. L2-3: Stable mild loss of disc height with mild disc bulging and mild bilateral facet hypertrophy. No significant spinal stenosis or foraminal narrowing. L3-4: Progressive loss of disc height with annular disc bulging and endplate osteophytes compared with remote MRI. There is mild facet and ligamentous hypertrophy. Mild to moderate multifactorial spinal stenosis does not appear significantly changed. However, there is increasing mild to moderate left greater than right lateral recess and foraminal narrowing. L4-5: Chronic loss of disc height with annular disc bulging and endplate osteophytes, similar to remote MRI. Stable mild multifactorial spinal stenosis. There is mildly increased narrowing of the left lateral recess. Mild foraminal narrowing bilaterally has not significantly progressed. L5-S1: Chronic loss of disc height with annular disc bulging and endplate osteophytes asymmetric to the left, similar to remote MRI. Mild facet and ligamentous hypertrophy. The spinal canal and lateral  recesses are sufficiently patent. There is chronic mild to moderate right and severe left foraminal narrowing which has slightly progressed. IMPRESSION: 1. No acute findings or clear explanation for the patient's symptoms. No evidence of metastatic disease within the lumbar spine. 2. Multilevel spondylosis has progressed from remote MRI from 2018. Associated discal T2 hyperintensity at the lower 3 levels appears degenerative. No evidence of discitis/osteomyelitis. 3. At L3-4, there is mild to moderate multifactorial spinal stenosis with increasing mild to moderate left greater than right lateral recess and foraminal narrowing. 4. Mild multifactorial spinal stenosis at L4-5 with mildly increased narrowing of the left lateral recess. 5. Slightly progressive chronic mild to moderate right and severe left foraminal narrowing at L5-S1. Electronically Signed   By: Carey Bullocks M.D.   On: 08/24/2023 13:32   ECHOCARDIOGRAM COMPLETE  Result Date: 08/23/2023    ECHOCARDIOGRAM REPORT   Patient Name:   Katelyn Jackson Date of Exam: 08/23/2023 Medical Rec #:  [id number]         Height:       61.0 in Accession #:    [id number]        Weight:       202.4 lb Date of Birth:  Aug 24, 1946         BSA:          1.898 m Patient Age:    77 years          BP:           133/63 mmHg Patient Gender: F                 HR:           66 bpm. Exam Location:  Inpatient Procedure: 2D Echo, Cardiac Doppler and Color Doppler Indications:    CHF I50.31  History:        Patient has prior history of Echocardiogram examinations, most                 recent 03/19/2021. CHF, Pacemaker, Pulmonary HTN,                 Arrythmias:Atrial Fibrillation; Risk Factors:Hypertension,                 Dyslipidemia  superior endplate compression deformity at T12. The visualized sacroiliac joints appear unremarkable. Conus medullaris: Extends to the L1-2 level and appears normal. Paraspinal and other soft tissues: No significant paraspinal findings. Disc levels: Sagittal images demonstrate stable degenerative changes in the visualized lower thoracic spine without resulting spinal stenosis or nerve root encroachment. L1-2: Preserved disc height and hydration. Mild bilateral facet hypertrophy. No spinal stenosis or foraminal narrowing. L2-3: Stable mild loss of disc height with mild disc bulging and mild bilateral facet hypertrophy. No significant spinal stenosis or foraminal narrowing. L3-4: Progressive loss of disc height with annular disc bulging and endplate osteophytes compared with remote MRI. There is mild facet and ligamentous hypertrophy. Mild to moderate multifactorial spinal stenosis does not appear significantly changed. However, there is increasing mild to moderate left greater than right lateral recess and foraminal narrowing. L4-5: Chronic loss of disc height with annular disc bulging and endplate osteophytes, similar to remote MRI. Stable mild multifactorial spinal stenosis. There is mildly increased narrowing of the left lateral recess. Mild foraminal narrowing bilaterally has not significantly progressed. L5-S1: Chronic loss of disc height with annular disc bulging and endplate osteophytes asymmetric to the left, similar to remote MRI. Mild facet and ligamentous hypertrophy. The spinal canal and lateral  recesses are sufficiently patent. There is chronic mild to moderate right and severe left foraminal narrowing which has slightly progressed. IMPRESSION: 1. No acute findings or clear explanation for the patient's symptoms. No evidence of metastatic disease within the lumbar spine. 2. Multilevel spondylosis has progressed from remote MRI from 2018. Associated discal T2 hyperintensity at the lower 3 levels appears degenerative. No evidence of discitis/osteomyelitis. 3. At L3-4, there is mild to moderate multifactorial spinal stenosis with increasing mild to moderate left greater than right lateral recess and foraminal narrowing. 4. Mild multifactorial spinal stenosis at L4-5 with mildly increased narrowing of the left lateral recess. 5. Slightly progressive chronic mild to moderate right and severe left foraminal narrowing at L5-S1. Electronically Signed   By: Carey Bullocks M.D.   On: 08/24/2023 13:32   ECHOCARDIOGRAM COMPLETE  Result Date: 08/23/2023    ECHOCARDIOGRAM REPORT   Patient Name:   Katelyn Jackson Date of Exam: 08/23/2023 Medical Rec #:  [id number]         Height:       61.0 in Accession #:    [id number]        Weight:       202.4 lb Date of Birth:  Aug 24, 1946         BSA:          1.898 m Patient Age:    77 years          BP:           133/63 mmHg Patient Gender: F                 HR:           66 bpm. Exam Location:  Inpatient Procedure: 2D Echo, Cardiac Doppler and Color Doppler Indications:    CHF I50.31  History:        Patient has prior history of Echocardiogram examinations, most                 recent 03/19/2021. CHF, Pacemaker, Pulmonary HTN,                 Arrythmias:Atrial Fibrillation; Risk Factors:Hypertension,                 Dyslipidemia  superior endplate compression deformity at T12. The visualized sacroiliac joints appear unremarkable. Conus medullaris: Extends to the L1-2 level and appears normal. Paraspinal and other soft tissues: No significant paraspinal findings. Disc levels: Sagittal images demonstrate stable degenerative changes in the visualized lower thoracic spine without resulting spinal stenosis or nerve root encroachment. L1-2: Preserved disc height and hydration. Mild bilateral facet hypertrophy. No spinal stenosis or foraminal narrowing. L2-3: Stable mild loss of disc height with mild disc bulging and mild bilateral facet hypertrophy. No significant spinal stenosis or foraminal narrowing. L3-4: Progressive loss of disc height with annular disc bulging and endplate osteophytes compared with remote MRI. There is mild facet and ligamentous hypertrophy. Mild to moderate multifactorial spinal stenosis does not appear significantly changed. However, there is increasing mild to moderate left greater than right lateral recess and foraminal narrowing. L4-5: Chronic loss of disc height with annular disc bulging and endplate osteophytes, similar to remote MRI. Stable mild multifactorial spinal stenosis. There is mildly increased narrowing of the left lateral recess. Mild foraminal narrowing bilaterally has not significantly progressed. L5-S1: Chronic loss of disc height with annular disc bulging and endplate osteophytes asymmetric to the left, similar to remote MRI. Mild facet and ligamentous hypertrophy. The spinal canal and lateral  recesses are sufficiently patent. There is chronic mild to moderate right and severe left foraminal narrowing which has slightly progressed. IMPRESSION: 1. No acute findings or clear explanation for the patient's symptoms. No evidence of metastatic disease within the lumbar spine. 2. Multilevel spondylosis has progressed from remote MRI from 2018. Associated discal T2 hyperintensity at the lower 3 levels appears degenerative. No evidence of discitis/osteomyelitis. 3. At L3-4, there is mild to moderate multifactorial spinal stenosis with increasing mild to moderate left greater than right lateral recess and foraminal narrowing. 4. Mild multifactorial spinal stenosis at L4-5 with mildly increased narrowing of the left lateral recess. 5. Slightly progressive chronic mild to moderate right and severe left foraminal narrowing at L5-S1. Electronically Signed   By: Carey Bullocks M.D.   On: 08/24/2023 13:32   ECHOCARDIOGRAM COMPLETE  Result Date: 08/23/2023    ECHOCARDIOGRAM REPORT   Patient Name:   Katelyn Jackson Date of Exam: 08/23/2023 Medical Rec #:  [id number]         Height:       61.0 in Accession #:    [id number]        Weight:       202.4 lb Date of Birth:  Aug 24, 1946         BSA:          1.898 m Patient Age:    77 years          BP:           133/63 mmHg Patient Gender: F                 HR:           66 bpm. Exam Location:  Inpatient Procedure: 2D Echo, Cardiac Doppler and Color Doppler Indications:    CHF I50.31  History:        Patient has prior history of Echocardiogram examinations, most                 recent 03/19/2021. CHF, Pacemaker, Pulmonary HTN,                 Arrythmias:Atrial Fibrillation; Risk Factors:Hypertension,                 Dyslipidemia  PROGRESS NOTE                                                                                                                                                                                                             Patient Demographics:    Katelyn Jackson, is a 77 y.o. female, DOB - Jan 25, 1946, [id number]  Outpatient Primary MD for the patient is Plotnikov, Georgina Quint, MD    LOS - 7  Admit date - 08/20/2023    Chief Complaint  Patient presents with   Fall       Brief Narrative (HPI from H&P)     29 Russian-speaking female with history of permanent A-fib on anticoagulation, status post pacemaker, decompensated liver cirrhosis, CKD stage 2 hypertension, hyperlipidemia, hypothyroidism, chronic lymphedema, history of breast cancer status post radiation therapy, recent trauma with fracture of left upper and lower extremities, wheelchair dependent who was brought from home after a level 2 trauma following ground-level fall.  She lives with her son.  Patient noted right-sided numbness involving her right face, arm and leg with left-sided weakness before fall.  Suspected stroke on admission but MRI of the brain and cervical spine did not show any acute findings.  She was admitted for further workup, it was significant for UTI.   Subjective:    Katelyn Jackson today with no significant events overnight as discussed with staff, she does report some headache this morning, she had BM yesterday, Hemoccult positive, but was light brown in color.    Assessment  & Plan :   Fall/ambulatory dysfunction: -Patient with multiple falls in the past, multiple hospitalization,-, rehab placement, main concern is right side weakness, unsteady gait, mainly upon standing up. -Extensive workup, loading MRI brain, cervical MRI, MRI lumbar spine, CT right hip. Negative MRI of the brain, cervical spine did not show any acute findings but showed  mild C5-6 spinal canal stenosis,moderate bilateral C3-4 and C4-5 neural foraminal stenosis.  Her skeletal survey did not show any acute findings.  Right CT head with no acute findings. - Recent hospitalization from  07/10 to 05/24/23 for mechanical fall with left patellar and left distal clavicle fracture.  Recently had surgery of the right shoulder.   -Workup negative for acute CVA, as well as no significant findings on workup for TIA, so it is unlikely as well, carotid Dopplers  PROGRESS NOTE                                                                                                                                                                                                             Patient Demographics:    Katelyn Jackson, is a 77 y.o. female, DOB - Jan 25, 1946, [id number]  Outpatient Primary MD for the patient is Plotnikov, Georgina Quint, MD    LOS - 7  Admit date - 08/20/2023    Chief Complaint  Patient presents with   Fall       Brief Narrative (HPI from H&P)     29 Russian-speaking female with history of permanent A-fib on anticoagulation, status post pacemaker, decompensated liver cirrhosis, CKD stage 2 hypertension, hyperlipidemia, hypothyroidism, chronic lymphedema, history of breast cancer status post radiation therapy, recent trauma with fracture of left upper and lower extremities, wheelchair dependent who was brought from home after a level 2 trauma following ground-level fall.  She lives with her son.  Patient noted right-sided numbness involving her right face, arm and leg with left-sided weakness before fall.  Suspected stroke on admission but MRI of the brain and cervical spine did not show any acute findings.  She was admitted for further workup, it was significant for UTI.   Subjective:    Katelyn Jackson today with no significant events overnight as discussed with staff, she does report some headache this morning, she had BM yesterday, Hemoccult positive, but was light brown in color.    Assessment  & Plan :   Fall/ambulatory dysfunction: -Patient with multiple falls in the past, multiple hospitalization,-, rehab placement, main concern is right side weakness, unsteady gait, mainly upon standing up. -Extensive workup, loading MRI brain, cervical MRI, MRI lumbar spine, CT right hip. Negative MRI of the brain, cervical spine did not show any acute findings but showed  mild C5-6 spinal canal stenosis,moderate bilateral C3-4 and C4-5 neural foraminal stenosis.  Her skeletal survey did not show any acute findings.  Right CT head with no acute findings. - Recent hospitalization from  07/10 to 05/24/23 for mechanical fall with left patellar and left distal clavicle fracture.  Recently had surgery of the right shoulder.   -Workup negative for acute CVA, as well as no significant findings on workup for TIA, so it is unlikely as well, carotid Dopplers  superior endplate compression deformity at T12. The visualized sacroiliac joints appear unremarkable. Conus medullaris: Extends to the L1-2 level and appears normal. Paraspinal and other soft tissues: No significant paraspinal findings. Disc levels: Sagittal images demonstrate stable degenerative changes in the visualized lower thoracic spine without resulting spinal stenosis or nerve root encroachment. L1-2: Preserved disc height and hydration. Mild bilateral facet hypertrophy. No spinal stenosis or foraminal narrowing. L2-3: Stable mild loss of disc height with mild disc bulging and mild bilateral facet hypertrophy. No significant spinal stenosis or foraminal narrowing. L3-4: Progressive loss of disc height with annular disc bulging and endplate osteophytes compared with remote MRI. There is mild facet and ligamentous hypertrophy. Mild to moderate multifactorial spinal stenosis does not appear significantly changed. However, there is increasing mild to moderate left greater than right lateral recess and foraminal narrowing. L4-5: Chronic loss of disc height with annular disc bulging and endplate osteophytes, similar to remote MRI. Stable mild multifactorial spinal stenosis. There is mildly increased narrowing of the left lateral recess. Mild foraminal narrowing bilaterally has not significantly progressed. L5-S1: Chronic loss of disc height with annular disc bulging and endplate osteophytes asymmetric to the left, similar to remote MRI. Mild facet and ligamentous hypertrophy. The spinal canal and lateral  recesses are sufficiently patent. There is chronic mild to moderate right and severe left foraminal narrowing which has slightly progressed. IMPRESSION: 1. No acute findings or clear explanation for the patient's symptoms. No evidence of metastatic disease within the lumbar spine. 2. Multilevel spondylosis has progressed from remote MRI from 2018. Associated discal T2 hyperintensity at the lower 3 levels appears degenerative. No evidence of discitis/osteomyelitis. 3. At L3-4, there is mild to moderate multifactorial spinal stenosis with increasing mild to moderate left greater than right lateral recess and foraminal narrowing. 4. Mild multifactorial spinal stenosis at L4-5 with mildly increased narrowing of the left lateral recess. 5. Slightly progressive chronic mild to moderate right and severe left foraminal narrowing at L5-S1. Electronically Signed   By: Carey Bullocks M.D.   On: 08/24/2023 13:32   ECHOCARDIOGRAM COMPLETE  Result Date: 08/23/2023    ECHOCARDIOGRAM REPORT   Patient Name:   Katelyn Jackson Date of Exam: 08/23/2023 Medical Rec #:  [id number]         Height:       61.0 in Accession #:    [id number]        Weight:       202.4 lb Date of Birth:  Aug 24, 1946         BSA:          1.898 m Patient Age:    77 years          BP:           133/63 mmHg Patient Gender: F                 HR:           66 bpm. Exam Location:  Inpatient Procedure: 2D Echo, Cardiac Doppler and Color Doppler Indications:    CHF I50.31  History:        Patient has prior history of Echocardiogram examinations, most                 recent 03/19/2021. CHF, Pacemaker, Pulmonary HTN,                 Arrythmias:Atrial Fibrillation; Risk Factors:Hypertension,                 Dyslipidemia  PROGRESS NOTE                                                                                                                                                                                                             Patient Demographics:    Katelyn Jackson, is a 77 y.o. female, DOB - Jan 25, 1946, [id number]  Outpatient Primary MD for the patient is Plotnikov, Georgina Quint, MD    LOS - 7  Admit date - 08/20/2023    Chief Complaint  Patient presents with   Fall       Brief Narrative (HPI from H&P)     29 Russian-speaking female with history of permanent A-fib on anticoagulation, status post pacemaker, decompensated liver cirrhosis, CKD stage 2 hypertension, hyperlipidemia, hypothyroidism, chronic lymphedema, history of breast cancer status post radiation therapy, recent trauma with fracture of left upper and lower extremities, wheelchair dependent who was brought from home after a level 2 trauma following ground-level fall.  She lives with her son.  Patient noted right-sided numbness involving her right face, arm and leg with left-sided weakness before fall.  Suspected stroke on admission but MRI of the brain and cervical spine did not show any acute findings.  She was admitted for further workup, it was significant for UTI.   Subjective:    Katelyn Jackson today with no significant events overnight as discussed with staff, she does report some headache this morning, she had BM yesterday, Hemoccult positive, but was light brown in color.    Assessment  & Plan :   Fall/ambulatory dysfunction: -Patient with multiple falls in the past, multiple hospitalization,-, rehab placement, main concern is right side weakness, unsteady gait, mainly upon standing up. -Extensive workup, loading MRI brain, cervical MRI, MRI lumbar spine, CT right hip. Negative MRI of the brain, cervical spine did not show any acute findings but showed  mild C5-6 spinal canal stenosis,moderate bilateral C3-4 and C4-5 neural foraminal stenosis.  Her skeletal survey did not show any acute findings.  Right CT head with no acute findings. - Recent hospitalization from  07/10 to 05/24/23 for mechanical fall with left patellar and left distal clavicle fracture.  Recently had surgery of the right shoulder.   -Workup negative for acute CVA, as well as no significant findings on workup for TIA, so it is unlikely as well, carotid Dopplers  PROGRESS NOTE                                                                                                                                                                                                             Patient Demographics:    Katelyn Jackson, is a 77 y.o. female, DOB - Jan 25, 1946, [id number]  Outpatient Primary MD for the patient is Plotnikov, Georgina Quint, MD    LOS - 7  Admit date - 08/20/2023    Chief Complaint  Patient presents with   Fall       Brief Narrative (HPI from H&P)     29 Russian-speaking female with history of permanent A-fib on anticoagulation, status post pacemaker, decompensated liver cirrhosis, CKD stage 2 hypertension, hyperlipidemia, hypothyroidism, chronic lymphedema, history of breast cancer status post radiation therapy, recent trauma with fracture of left upper and lower extremities, wheelchair dependent who was brought from home after a level 2 trauma following ground-level fall.  She lives with her son.  Patient noted right-sided numbness involving her right face, arm and leg with left-sided weakness before fall.  Suspected stroke on admission but MRI of the brain and cervical spine did not show any acute findings.  She was admitted for further workup, it was significant for UTI.   Subjective:    Katelyn Jackson today with no significant events overnight as discussed with staff, she does report some headache this morning, she had BM yesterday, Hemoccult positive, but was light brown in color.    Assessment  & Plan :   Fall/ambulatory dysfunction: -Patient with multiple falls in the past, multiple hospitalization,-, rehab placement, main concern is right side weakness, unsteady gait, mainly upon standing up. -Extensive workup, loading MRI brain, cervical MRI, MRI lumbar spine, CT right hip. Negative MRI of the brain, cervical spine did not show any acute findings but showed  mild C5-6 spinal canal stenosis,moderate bilateral C3-4 and C4-5 neural foraminal stenosis.  Her skeletal survey did not show any acute findings.  Right CT head with no acute findings. - Recent hospitalization from  07/10 to 05/24/23 for mechanical fall with left patellar and left distal clavicle fracture.  Recently had surgery of the right shoulder.   -Workup negative for acute CVA, as well as no significant findings on workup for TIA, so it is unlikely as well, carotid Dopplers

## 2023-08-27 NOTE — OR Nursing (Addendum)
Pt confused on what procedure was being performed in pre-op; obtained phone consent from son with anesthesia.  Alben Spittle [place]

## 2023-08-27 NOTE — Op Note (Signed)
[facility] Patient Name: Katelyn Jackson Procedure Date : 08/27/2023 MRN: [id number] Attending MD: Kerin Salen , MD, [id number] Date of Birth: [birth year] CSN: [id number] Age: 77 Admit Type: Inpatient Procedure:                Upper GI endoscopy Indications:              Unexplained iron deficiency anemia, Occult blood in                            stool, Cirrhosis rule out esophageal varices Providers:                Kerin Salen, MD, Eliberto Ivory, RN, Marja Kays,                            Technician Referring MD:             [facility] Medicines:                Monitored Anesthesia Care Complications:            No immediate complications. Estimated blood loss:                            Minimal. Estimated Blood Loss:     Estimated blood loss was minimal. Procedure:                Pre-Anesthesia Assessment:                           - Prior to the procedure, a History and Physical                            was performed, and patient medications and                            allergies were reviewed. The patient's tolerance of                            previous anesthesia was also reviewed. The risks                            and benefits of the procedure and the sedation                            options and risks were discussed with the patient.                            All questions were answered, and informed consent                            was obtained. Prior Anticoagulants: The patient has                            taken Eliquis (apixaban), last dose was 7 days  prior to procedure. ASA Grade Assessment: III - A                            patient with severe systemic disease. After                            reviewing the risks and benefits, the patient was                            deemed in satisfactory condition to undergo the                            procedure.                           After obtaining  informed consent, the endoscope was                            passed under direct vision. Throughout the                            procedure, the patient's blood pressure, pulse, and                            oxygen saturations were monitored continuously. The                            [id number]-H190 ([id number]) Olympus endoscope was introduced                            through the mouth, and advanced to the second part                            of duodenum. The upper GI endoscopy was                            accomplished without difficulty. The patient                            tolerated the procedure well. Scope In: Scope Out: Findings:      The examined esophagus was normal.      The Z-line was regular and was found 35 cm from the incisors.      A 3 cm hiatal hernia was present.      Localized mildly erythematous mucosa without bleeding was found in the       gastric antrum. Biopsies were taken with a cold forceps for Helicobacter       pylori testing.      The cardia and gastric fundus were normal on retroflexion.      The examined duodenum was normal. Impression:               - Normal esophagus.                           - Z-line regular, 35 cm from the incisors.                           -  3 cm hiatal hernia.                           - Erythematous mucosa in the antrum. Biopsied.                           - Normal examined duodenum. Moderate Sedation:      Patient did not receive moderate sedation for this procedure, but       instead received monitored anesthesia care. Recommendation:           - Resume regular diet.                           - Continue present medications.                           - Await pathology results.                           - Resume Eliquis (apixaban) at prior dose tomorrow. Procedure Code(s):        --- Professional ---                           [phone], Esophagogastroduodenoscopy, flexible,                            transoral; with biopsy,  single or multiple Diagnosis Code(s):        --- Professional ---                           K44.9, Diaphragmatic hernia without obstruction or                            gangrene                           K31.89, Other diseases of stomach and duodenum                           D50.9, Iron deficiency anemia, unspecified                           R19.5, Other fecal abnormalities                           K74.60, Unspecified cirrhosis of liver CPT copyright 2022 American Medical Association. All rights reserved. The codes documented in this report are preliminary and upon coder review may  be revised to meet current compliance requirements. Kerin Salen, MD 08/27/2023 9:50:57 AM This report has been signed electronically. Number of Addenda: 0

## 2023-08-27 NOTE — Interval H&P Note (Signed)
History and Physical Interval Note: 77/female with anemia, cirrhosis, occult blood in stool, was on eliquis for EGD, possible banding if needed with propofol.  08/27/2023 8:59 AM  Katelyn Jackson  has presented today for EGD, possible banding if needed with propofol, with the diagnosis of Anemia, FOBT positive stools.  The various methods of treatment have been discussed with the patient and family. After consideration of risks, benefits and other options for treatment, the patient has consented to  Procedure(s): ESOPHAGOGASTRODUODENOSCOPY (EGD) WITH PROPOFOL (N/A) as a surgical intervention.  The patient's history has been reviewed, patient examined, no change in status, stable for surgery.  I have reviewed the patient's chart and labs.  Questions were answered to the patient's satisfaction.     Kerin Salen

## 2023-08-27 NOTE — Anesthesia Procedure Notes (Signed)
Procedure Name: MAC Date/Time: 08/27/2023 9:45 AM  Performed by: Adria Dill, CRNAPre-anesthesia Checklist: Patient identified, Emergency Drugs available, Suction available and Patient being monitored Patient Re-evaluated:Patient Re-evaluated prior to induction Oxygen Delivery Method: Nasal cannula Preoxygenation: Pre-oxygenation with 100% oxygen Induction Type: IV induction Airway Equipment and Method: Bite block Placement Confirmation: positive ETCO2 and breath sounds checked- equal and bilateral Dental Injury: Teeth and Oropharynx as per pre-operative assessment

## 2023-08-27 NOTE — Transfer of Care (Signed)
Immediate Anesthesia Transfer of Care Note  Patient: Katelyn Jackson  Procedure(s) Performed: ESOPHAGOGASTRODUODENOSCOPY (EGD) WITH PROPOFOL BIOPSY  Patient Location: PACU  Anesthesia Type:MAC  Level of Consciousness: drowsy and patient cooperative  Airway & Oxygen Therapy: Patient Spontanous Breathing and Patient connected to nasal cannula oxygen  Post-op Assessment: Report given to RN and Post -op Vital signs reviewed and stable  Post vital signs: Reviewed and stable  Last Vitals:  Vitals Value Taken Time  BP    Temp    Pulse    Resp    SpO2      Last Pain:  Vitals:   08/27/23 0848  TempSrc: Temporal  PainSc: 4          Complications: No notable events documented.

## 2023-08-27 NOTE — Plan of Care (Signed)

## 2023-08-27 NOTE — Anesthesia Postprocedure Evaluation (Signed)
Anesthesia Post Note  Patient: Katelyn Jackson  Procedure(s) Performed: ESOPHAGOGASTRODUODENOSCOPY (EGD) WITH PROPOFOL BIOPSY     Patient location during evaluation: PACU Anesthesia Type: MAC Level of consciousness: awake and alert Pain management: pain level controlled Vital Signs Assessment: post-procedure vital signs reviewed and stable Respiratory status: spontaneous breathing, nonlabored ventilation, respiratory function stable and patient connected to nasal cannula oxygen Cardiovascular status: stable and blood pressure returned to baseline Postop Assessment: no apparent nausea or vomiting Anesthetic complications: no  No notable events documented.  Last Vitals:  Vitals:   08/27/23 1213 08/27/23 1228  BP: 139/69 139/69  Pulse: 65 63  Resp: 15 17  Temp: 37.1 C   SpO2: 100% 100%    Last Pain:  Vitals:   08/27/23 1213  TempSrc: Oral  PainSc:                  Kennieth Rad

## 2023-08-27 NOTE — Anesthesia Preprocedure Evaluation (Signed)
Anesthesia Evaluation  Patient identified by MRN, date of birth, ID band Patient confused    Reviewed: Allergy & Precautions, NPO status , Patient's Chart, lab work & pertinent test results  Airway Mallampati: III  TM Distance: >3 FB     Dental   Pulmonary    breath sounds clear to auscultation       Cardiovascular hypertension, + dysrhythmias + pacemaker  Rhythm:Regular Rate:Normal     Neuro/Psych  Headaches    GI/Hepatic ,GERD  ,,(+) Cirrhosis   Esophageal Varices      Endo/Other  Hypothyroidism    Renal/GU Renal disease     Musculoskeletal  (+) Arthritis ,    Abdominal   Peds  Hematology  (+) Blood dyscrasia, anemia   Anesthesia Other Findings   Reproductive/Obstetrics                             Anesthesia Physical Anesthesia Plan  ASA: 4  Anesthesia Plan: MAC   Post-op Pain Management:    Induction:   PONV Risk Score and Plan: 2 and Propofol infusion  Airway Management Planned: Natural Airway and Nasal Cannula  Additional Equipment:   Intra-op Plan:   Post-operative Plan:   Informed Consent: I have reviewed the patients History and Physical, chart, labs and discussed the procedure including the risks, benefits and alternatives for the proposed anesthesia with the patient or authorized representative who has indicated his/her understanding and acceptance.       Plan Discussed with:   Anesthesia Plan Comments:        Anesthesia Quick Evaluation

## 2023-08-28 DIAGNOSIS — E538 Deficiency of other specified B group vitamins: Secondary | ICD-10-CM | POA: Diagnosis not present

## 2023-08-28 DIAGNOSIS — R5381 Other malaise: Secondary | ICD-10-CM | POA: Diagnosis not present

## 2023-08-28 DIAGNOSIS — D638 Anemia in other chronic diseases classified elsewhere: Secondary | ICD-10-CM | POA: Diagnosis not present

## 2023-08-28 DIAGNOSIS — Z4789 Encounter for other orthopedic aftercare: Secondary | ICD-10-CM | POA: Diagnosis not present

## 2023-08-28 DIAGNOSIS — R2990 Unspecified symptoms and signs involving the nervous system: Secondary | ICD-10-CM | POA: Diagnosis not present

## 2023-08-28 DIAGNOSIS — R296 Repeated falls: Secondary | ICD-10-CM | POA: Diagnosis not present

## 2023-08-28 DIAGNOSIS — M109 Gout, unspecified: Secondary | ICD-10-CM | POA: Diagnosis not present

## 2023-08-28 DIAGNOSIS — M791 Myalgia, unspecified site: Secondary | ICD-10-CM | POA: Insufficient documentation

## 2023-08-28 DIAGNOSIS — R531 Weakness: Secondary | ICD-10-CM | POA: Insufficient documentation

## 2023-08-28 DIAGNOSIS — I110 Hypertensive heart disease with heart failure: Secondary | ICD-10-CM | POA: Diagnosis not present

## 2023-08-28 DIAGNOSIS — W19XXXA Unspecified fall, initial encounter: Secondary | ICD-10-CM | POA: Diagnosis not present

## 2023-08-28 DIAGNOSIS — I639 Cerebral infarction, unspecified: Secondary | ICD-10-CM | POA: Insufficient documentation

## 2023-08-28 DIAGNOSIS — I509 Heart failure, unspecified: Secondary | ICD-10-CM | POA: Diagnosis not present

## 2023-08-28 DIAGNOSIS — R239 Unspecified skin changes: Secondary | ICD-10-CM | POA: Insufficient documentation

## 2023-08-28 DIAGNOSIS — I4891 Unspecified atrial fibrillation: Secondary | ICD-10-CM | POA: Diagnosis not present

## 2023-08-28 DIAGNOSIS — N390 Urinary tract infection, site not specified: Secondary | ICD-10-CM | POA: Diagnosis not present

## 2023-08-28 DIAGNOSIS — M6281 Muscle weakness (generalized): Secondary | ICD-10-CM | POA: Diagnosis not present

## 2023-08-28 DIAGNOSIS — Z170 Estrogen receptor positive status [ER+]: Secondary | ICD-10-CM | POA: Insufficient documentation

## 2023-08-28 DIAGNOSIS — N179 Acute kidney failure, unspecified: Secondary | ICD-10-CM | POA: Diagnosis not present

## 2023-08-28 DIAGNOSIS — I10 Essential (primary) hypertension: Secondary | ICD-10-CM | POA: Diagnosis not present

## 2023-08-28 DIAGNOSIS — Z299 Encounter for prophylactic measures, unspecified: Secondary | ICD-10-CM | POA: Diagnosis not present

## 2023-08-28 DIAGNOSIS — E039 Hypothyroidism, unspecified: Secondary | ICD-10-CM | POA: Diagnosis not present

## 2023-08-28 DIAGNOSIS — N189 Chronic kidney disease, unspecified: Secondary | ICD-10-CM | POA: Diagnosis not present

## 2023-08-28 DIAGNOSIS — D649 Anemia, unspecified: Secondary | ICD-10-CM | POA: Diagnosis not present

## 2023-08-28 DIAGNOSIS — Z7401 Bed confinement status: Secondary | ICD-10-CM | POA: Diagnosis not present

## 2023-08-28 DIAGNOSIS — Z466 Encounter for fitting and adjustment of urinary device: Secondary | ICD-10-CM | POA: Diagnosis not present

## 2023-08-28 DIAGNOSIS — I9589 Other hypotension: Secondary | ICD-10-CM | POA: Insufficient documentation

## 2023-08-28 DIAGNOSIS — N3000 Acute cystitis without hematuria: Secondary | ICD-10-CM | POA: Diagnosis not present

## 2023-08-28 LAB — CBC
HCT: 27.6 % — ABNORMAL LOW (ref 36.0–46.0)
Hemoglobin: 8.5 g/dL — ABNORMAL LOW (ref 12.0–15.0)
MCH: 30.5 pg (ref 26.0–34.0)
MCHC: 30.8 g/dL (ref 30.0–36.0)
MCV: 98.9 fL (ref 80.0–100.0)
Platelets: 150 10*3/uL (ref 150–400)
RBC: 2.79 MIL/uL — ABNORMAL LOW (ref 3.87–5.11)
RDW: 13.7 % (ref 11.5–15.5)
WBC: 4.9 10*3/uL (ref 4.0–10.5)
nRBC: 0 % (ref 0.0–0.2)

## 2023-08-28 LAB — BASIC METABOLIC PANEL
Anion gap: 7 (ref 5–15)
BUN: 12 mg/dL (ref 8–23)
CO2: 23 mmol/L (ref 22–32)
Calcium: 9.8 mg/dL (ref 8.9–10.3)
Chloride: 109 mmol/L (ref 98–111)
Creatinine, Ser: 1.23 mg/dL — ABNORMAL HIGH (ref 0.44–1.00)
GFR, Estimated: 45 mL/min — ABNORMAL LOW (ref >60)
Glucose, Bld: 91 mg/dL (ref 70–99)
Potassium: 3.9 mmol/L (ref 3.5–5.1)
Sodium: 139 mmol/L (ref 135–145)

## 2023-08-28 MED ORDER — CYANOCOBALAMIN 1000 MCG/ML IJ SOLN: 1000.0000 ug | INTRAMUSCULAR | Status: DC

## 2023-08-28 MED ORDER — APIXABAN 5 MG PO TABS
5.0000 mg | ORAL_TABLET | Freq: Two times a day (BID) | ORAL | Status: DC
  Administered 2023-08-28: 5 mg via ORAL
  Filled 2023-08-28: qty 1

## 2023-08-28 MED ORDER — EZETIMIBE 10 MG PO TABS: 10.0000 mg | ORAL_TABLET | Freq: Every day | ORAL | Status: DC

## 2023-08-28 MED ORDER — TORSEMIDE 20 MG PO TABS: 60.0000 mg | ORAL_TABLET | Freq: Every day | ORAL | Status: DC

## 2023-08-28 MED ORDER — TORSEMIDE 100 MG PO TABS: 100.0000 mg | ORAL_TABLET | Freq: Every day | ORAL | Status: DC

## 2023-08-28 NOTE — TOC Transition Note (Signed)
Transition of Care [facility]) - CM/SW Discharge Note   Patient Details  Name: Katelyn Jackson MRN: [id number] Date of Birth: 1946/07/30  [facility] CM/SW Contact:  Mearl Latin, LCSW Phone Number: 08/28/2023, 1:00 PM   Clinical Narrative:    [name] Anticipated DC date: 08/28/23 Family notified: Son, [name] Transport by: Sharin Mons   Per MD patient ready for DC to [facility]. RN to call report prior to discharge [phone] [place] room 40). RN, patient, patient's family, and facility notified of DC. Discharge Summary and FL2 sent to facility. DC packet on chart including signed DNR. Ambulance transport requested for patient.   CSW will sign off for now as social work intervention is no longer needed. Please consult [place] again if new needs arise.     Final next level of care: Skilled Nursing Facility Barriers to Discharge: Barriers Resolved   Patient Goals and CMS Choice CMS Medicare.gov Compare Post Acute Care list provided to:: Patient Represenative (must comment) Choice offered to / list presented to : Adult Children  Discharge Placement     Existing PASRR number confirmed : 08/28/23          [name] Patient to be transferred to facility by: [facility] Name of family member notified: Son Patient and family notified of of transfer: 08/28/23  Discharge Plan and Services Additional resources added to the After Visit Summary for   In-house Referral: Clinical Social Work                                   Social Determinants of Health (SDOH) Interventions SDOH Screenings   Food Insecurity: No Food Insecurity (08/21/2023)  Housing: Patient Unable To Answer (08/21/2023)  Transportation Needs: No Transportation Needs (08/21/2023)  Utilities: Not At Risk (08/21/2023)  Alcohol Screen: Low Risk  (06/15/2022)  Depression (PHQ2-9): Low Risk  (04/17/2023)  Financial Resource Strain: Low Risk  (06/15/2022)   Physical Activity: Inactive (06/15/2022)  Social Connections: Socially Isolated (06/15/2022)  Stress: No Stress Concern Present (06/15/2022)  Tobacco Use: Low Risk  (08/27/2023)     Readmission Risk Interventions    07/06/2021   11:49 AM 03/19/2021    3:03 PM  Readmission Risk Prevention Plan  Transportation Screening Complete Complete  HRI or Home Care Consult  Complete  Social Work Consult for Recovery Care Planning/Counseling  Complete  Palliative Care Screening  Not Applicable  Medication Review Oceanographer)  Complete  HRI or Home Care Consult Complete   SW Recovery Care/Counseling Consult Complete   Palliative Care Screening Not Applicable   Skilled Nursing Facility Complete

## 2023-08-28 NOTE — TOC Progression Note (Signed)
Transition of Care [facility]) - Progression Note    Patient Details  Name: Katelyn Jackson MRN: [id number] Date of Birth: August 02, 1946  Transition of Care [facility]) CM/SW Contact  Mearl Latin, [place] Phone Number: 08/28/2023, 10:23 AM  Clinical Narrative:    [name] able to accept patient today into a private room until a semi private opens.    Expected Discharge Plan: Skilled Nursing Facility Barriers to Discharge: Continued Medical Work up, English as a second language teacher  Expected Discharge Plan and Services In-house Referral: Clinical Social Work     Living arrangements for the past 2 months: Single Family Home, Skilled Nursing Facility                                       Social Determinants of Health (SDOH) Interventions SDOH Screenings   Food Insecurity: No Food Insecurity (08/21/2023)  Housing: Patient Unable To Answer (08/21/2023)  Transportation Needs: No Transportation Needs (08/21/2023)  Utilities: Not At Risk (08/21/2023)  Alcohol Screen: Low Risk  (06/15/2022)  Depression (PHQ2-9): Low Risk  (04/17/2023)  Financial Resource Strain: Low Risk  (06/15/2022)  Physical Activity: Inactive (06/15/2022)  Social Connections: Socially Isolated (06/15/2022)  Stress: No Stress Concern Present (06/15/2022)  Tobacco Use: Low Risk  (08/27/2023)    Readmission Risk Interventions    07/06/2021   11:49 AM 03/19/2021    3:03 PM  Readmission Risk Prevention Plan  Transportation Screening Complete Complete  HRI or Home Care Consult  Complete  Social Work Consult for Recovery Care Planning/Counseling  Complete  Palliative Care Screening  Not Applicable  Medication Review Oceanographer)  Complete  HRI or Home Care Consult Complete   SW Recovery Care/Counseling Consult Complete   Palliative Care Screening Not Applicable   Skilled Nursing Facility Complete

## 2023-08-28 NOTE — Discharge Summary (Addendum)
Physician Discharge Summary  Katelyn Jackson ZOX:[id number] DOB: [birth year] DOA: 08/20/2023  PCP: Tresa Garter, MD  Admit date: 08/20/2023 Discharge date: 08/28/2023  Admitted From: (Home) Disposition:  (SNF)  Recommendations for Outpatient Follow-up:  Please obtain BMP/CBC in one week Check B12 level 4 to 6 weeks Please monitor her volume status and adjust diuresis as needed,  as her diuretics has been significantly decreased at time of discharge from her home baseline, please see discussion below   Diet recommendation: Heart Healthy  Brief/Interim Summary:   73 Russian-speaking female with history of permanent A-fib on anticoagulation, status post pacemaker, decompensated liver cirrhosis, CKD stage 2 hypertension, hyperlipidemia, hypothyroidism, chronic lymphedema, history of breast cancer status post radiation therapy, recent trauma with fracture of left upper and lower extremities, wheelchair dependent who was brought from home after a level 2 trauma following ground-level fall.  She lives with her son.  Patient noted right-sided numbness involving her right face, arm and leg with left-sided weakness before fall.  Suspected stroke on admission but MRI of the brain and cervical spine did not show any acute findings.  She was admitted for further workup, workup significant for ESP L UTI, and iron deficiency anemia, please see discussion below.  Fall/ambulatory dysfunction: -Patient with multiple falls in the past, multiple hospitalization, rehab placement, main concern is right side weakness, unsteady gait, mainly upon standing up. -Extensive workup, loading MRI brain, cervical MRI, MRI lumbar spine, CT right hip. Negative MRI of the brain, cervical spine did not show any acute findings but showed mild C5-6 spinal canal stenosis,moderate bilateral C3-4 and C4-5 neural foraminal stenosis.  Her skeletal survey did not show any acute findings.  Right CT head with no acute  findings. - Recent hospitalization from  07/10 to 05/24/23 for mechanical fall with left patellar and left distal clavicle fracture.  Recently had surgery of the right shoulder.   -Workup negative for acute CVA, as well as no significant findings on workup for TIA, so it is unlikely as well, carotid Dopplers reassuring -Patient main issue is right side lower extremity weakness upon trying to stand and to ambulate, most likely in the setting of her severe arthritis in the right knee causing this issue, current management will be continue with PT-OT.   1 bottle of blood culture growing Corynebacterium, gram-positive rods,  - contaminant, but given she has a pacemaker, I did repeat blood cultures, which did remain negative through hospital stay.   B12 deficiency -B12 level is borderline at 327,  on IM supplements, please recheck level in 4 to 6 weeks as an outpatient.   AKI on CKD stage II: -  Baseline creatinine around 1.1.  Is on diuretics and was hypotensive, though her diuretics has been held during hospital stay, as well her Coreg, renal function has recovered, blood pressure has improved, Coreg was resumed, and her diuresis dose has been decreased,  -Home Dose of torsemide 100 mg p.o. twice daily, this has been decreased to 60 mg oral daily, Aldactone was resumed at home dose of 25 mg oral daily.     Chronic hypotension:  -On midodrine therapy.    Anemia of chronic kidney disease Iron deficiency anemia Hemoccult positive stool -Overall hemoglobin has been trending down, heme occult positive stool, GI input greatly appreciated, she went for EGD 10/20, which was significant for mild gastritis, recommendation for prolonged PPI therapy, okay to resume Eliquis today as discussed with GI , patient has declined colonoscopy. -Monitor CBC closely  CCA Distal66      17                                intimal thickening  +----------+--------+--------+--------+------------------+------------------+ ICA Prox  65      16              calcific                             +----------+--------+--------+--------+------------------+------------------+ ICA Distal98      34              calcific          tortuous           +----------+--------+--------+--------+------------------+------------------+ +----------+--------+--------+----------------+-------------------+           PSV cm/sEDV cm/sDescribe        Arm Pressure (mmHG) +----------+--------+--------+----------------+-------------------+ [id number]             Multiphasic, WNL                    +----------+--------+--------+----------------+-------------------+ +---------+--------+--+--------+--+---------+ VertebralPSV cm/s60EDV cm/s14Antegrade +---------+--------+--+--------+--+---------+   Summary: Right Carotid: The extracranial vessels were near-normal with only minimal wall                thickening or plaque. Left Carotid: The extracranial vessels were near-normal with only minimal wall               thickening or plaque. Vertebrals:  Bilateral vertebral arteries demonstrate antegrade flow. Subclavians: Normal flow hemodynamics were seen in bilateral subclavian              arteries. *See table(s) above for measurements and observations.  Electronically signed by Delia Heady MD on 08/23/2023 at 6:06:50 PM.    Final    ECHOCARDIOGRAM COMPLETE  Result Date: 08/23/2023    ECHOCARDIOGRAM REPORT   Patient Name:   Katelyn Jackson Date of Exam: 08/23/2023 Medical Rec #:  [id number]         Height:       61.0 in Accession #:    [id number]        Weight:       202.4 lb Date of Birth:  12-28-1945         BSA:          1.898 m Patient Age:    77 years          BP:           133/63 mmHg Patient Gender: F                 HR:           66 bpm. Exam Location:  Inpatient Procedure: 2D Echo, Cardiac Doppler and Color Doppler Indications:    CHF I50.31   History:        Patient has prior history of Echocardiogram examinations, most                 recent 03/19/2021. CHF, Pacemaker, Pulmonary HTN,                 Arrythmias:Atrial Fibrillation; Risk Factors:Hypertension,                 Dyslipidemia and Non-Smoker.  Sonographer:    Dondra Prader RVT RCS Referring Phys: [id number] [facility] IMPRESSIONS  1. Left ventricular ejection fraction, by estimation, is 40 to 45%. The  CCA Distal66      17                                intimal thickening  +----------+--------+--------+--------+------------------+------------------+ ICA Prox  65      16              calcific                             +----------+--------+--------+--------+------------------+------------------+ ICA Distal98      34              calcific          tortuous           +----------+--------+--------+--------+------------------+------------------+ +----------+--------+--------+----------------+-------------------+           PSV cm/sEDV cm/sDescribe        Arm Pressure (mmHG) +----------+--------+--------+----------------+-------------------+ [id number]             Multiphasic, WNL                    +----------+--------+--------+----------------+-------------------+ +---------+--------+--+--------+--+---------+ VertebralPSV cm/s60EDV cm/s14Antegrade +---------+--------+--+--------+--+---------+   Summary: Right Carotid: The extracranial vessels were near-normal with only minimal wall                thickening or plaque. Left Carotid: The extracranial vessels were near-normal with only minimal wall               thickening or plaque. Vertebrals:  Bilateral vertebral arteries demonstrate antegrade flow. Subclavians: Normal flow hemodynamics were seen in bilateral subclavian              arteries. *See table(s) above for measurements and observations.  Electronically signed by Delia Heady MD on 08/23/2023 at 6:06:50 PM.    Final    ECHOCARDIOGRAM COMPLETE  Result Date: 08/23/2023    ECHOCARDIOGRAM REPORT   Patient Name:   Katelyn Jackson Date of Exam: 08/23/2023 Medical Rec #:  [id number]         Height:       61.0 in Accession #:    [id number]        Weight:       202.4 lb Date of Birth:  12-28-1945         BSA:          1.898 m Patient Age:    77 years          BP:           133/63 mmHg Patient Gender: F                 HR:           66 bpm. Exam Location:  Inpatient Procedure: 2D Echo, Cardiac Doppler and Color Doppler Indications:    CHF I50.31   History:        Patient has prior history of Echocardiogram examinations, most                 recent 03/19/2021. CHF, Pacemaker, Pulmonary HTN,                 Arrythmias:Atrial Fibrillation; Risk Factors:Hypertension,                 Dyslipidemia and Non-Smoker.  Sonographer:    Dondra Prader RVT RCS Referring Phys: [id number] [facility] IMPRESSIONS  1. Left ventricular ejection fraction, by estimation, is 40 to 45%. The  CCA Distal66      17                                intimal thickening  +----------+--------+--------+--------+------------------+------------------+ ICA Prox  65      16              calcific                             +----------+--------+--------+--------+------------------+------------------+ ICA Distal98      34              calcific          tortuous           +----------+--------+--------+--------+------------------+------------------+ +----------+--------+--------+----------------+-------------------+           PSV cm/sEDV cm/sDescribe        Arm Pressure (mmHG) +----------+--------+--------+----------------+-------------------+ [id number]             Multiphasic, WNL                    +----------+--------+--------+----------------+-------------------+ +---------+--------+--+--------+--+---------+ VertebralPSV cm/s60EDV cm/s14Antegrade +---------+--------+--+--------+--+---------+   Summary: Right Carotid: The extracranial vessels were near-normal with only minimal wall                thickening or plaque. Left Carotid: The extracranial vessels were near-normal with only minimal wall               thickening or plaque. Vertebrals:  Bilateral vertebral arteries demonstrate antegrade flow. Subclavians: Normal flow hemodynamics were seen in bilateral subclavian              arteries. *See table(s) above for measurements and observations.  Electronically signed by Delia Heady MD on 08/23/2023 at 6:06:50 PM.    Final    ECHOCARDIOGRAM COMPLETE  Result Date: 08/23/2023    ECHOCARDIOGRAM REPORT   Patient Name:   Katelyn Jackson Date of Exam: 08/23/2023 Medical Rec #:  [id number]         Height:       61.0 in Accession #:    [id number]        Weight:       202.4 lb Date of Birth:  12-28-1945         BSA:          1.898 m Patient Age:    77 years          BP:           133/63 mmHg Patient Gender: F                 HR:           66 bpm. Exam Location:  Inpatient Procedure: 2D Echo, Cardiac Doppler and Color Doppler Indications:    CHF I50.31   History:        Patient has prior history of Echocardiogram examinations, most                 recent 03/19/2021. CHF, Pacemaker, Pulmonary HTN,                 Arrythmias:Atrial Fibrillation; Risk Factors:Hypertension,                 Dyslipidemia and Non-Smoker.  Sonographer:    Dondra Prader RVT RCS Referring Phys: [id number] [facility] IMPRESSIONS  1. Left ventricular ejection fraction, by estimation, is 40 to 45%. The  Study Patient Name:  Katelyn Jackson  Date of Exam:   08/22/2023 Medical Rec #: [id number]          Accession #:    [id number] Date of Birth: October 05, 1946          Patient Gender: F Patient Age:   32 years Exam Location:  [facility] Procedure:      VAS US CAROTID Referring Phys: [facility] --------------------------------------------------------------------------------  Indications:       TIA. Risk Factors:      Hypertension, hyperlipidemia, no history of smoking. Other Factors:     Afib, PM. Limitations        Today's exam was limited due to the body habitus of the                    patient and tortuous vessels. Comparison Study:  No previous exams Performing Technologist: Jody Hill RVT, RDMS  Examination Guidelines: A complete evaluation includes B-mode imaging, spectral Doppler, color Doppler, and power Doppler as needed of all accessible portions of each vessel. Bilateral testing is considered an integral part of a complete examination. Limited  examinations for reoccurring indications may be performed as noted.  Right Carotid Findings: +----------+--------+--------+--------+------------------+--------+           PSV cm/sEDV cm/sStenosisPlaque DescriptionComments +----------+--------+--------+--------+------------------+--------+ CCA Prox  76      12                                tortuous +----------+--------+--------+--------+------------------+--------+ CCA Distal89      15                                         +----------+--------+--------+--------+------------------+--------+ ICA Prox  85      24                                         +----------+--------+--------+--------+------------------+--------+ ICA Distal79      25                                tortuous +----------+--------+--------+--------+------------------+--------+ ECA       66      10                                         +----------+--------+--------+--------+------------------+--------+ +----------+--------+-------+----------------+-------------------+           PSV cm/sEDV cmsDescribe        Arm Pressure (mmHG) +----------+--------+-------+----------------+-------------------+ Subclavian100            Multiphasic, WNL                    +----------+--------+-------+----------------+-------------------+ +---------+--------+--+--------+--+---------+ VertebralPSV cm/s58EDV cm/s14Antegrade +---------+--------+--+--------+--+---------+  Left Carotid Findings: +----------+--------+--------+--------+------------------+------------------+           PSV cm/sEDV cm/sStenosisPlaque DescriptionComments           +----------+--------+--------+--------+------------------+------------------+ CCA Prox  81      15                                                   +----------+--------+--------+--------+------------------+------------------+  Physician Discharge Summary  Katelyn Jackson ZOX:[id number] DOB: [birth year] DOA: 08/20/2023  PCP: Tresa Garter, MD  Admit date: 08/20/2023 Discharge date: 08/28/2023  Admitted From: (Home) Disposition:  (SNF)  Recommendations for Outpatient Follow-up:  Please obtain BMP/CBC in one week Check B12 level 4 to 6 weeks Please monitor her volume status and adjust diuresis as needed,  as her diuretics has been significantly decreased at time of discharge from her home baseline, please see discussion below   Diet recommendation: Heart Healthy  Brief/Interim Summary:   73 Russian-speaking female with history of permanent A-fib on anticoagulation, status post pacemaker, decompensated liver cirrhosis, CKD stage 2 hypertension, hyperlipidemia, hypothyroidism, chronic lymphedema, history of breast cancer status post radiation therapy, recent trauma with fracture of left upper and lower extremities, wheelchair dependent who was brought from home after a level 2 trauma following ground-level fall.  She lives with her son.  Patient noted right-sided numbness involving her right face, arm and leg with left-sided weakness before fall.  Suspected stroke on admission but MRI of the brain and cervical spine did not show any acute findings.  She was admitted for further workup, workup significant for ESP L UTI, and iron deficiency anemia, please see discussion below.  Fall/ambulatory dysfunction: -Patient with multiple falls in the past, multiple hospitalization, rehab placement, main concern is right side weakness, unsteady gait, mainly upon standing up. -Extensive workup, loading MRI brain, cervical MRI, MRI lumbar spine, CT right hip. Negative MRI of the brain, cervical spine did not show any acute findings but showed mild C5-6 spinal canal stenosis,moderate bilateral C3-4 and C4-5 neural foraminal stenosis.  Her skeletal survey did not show any acute findings.  Right CT head with no acute  findings. - Recent hospitalization from  07/10 to 05/24/23 for mechanical fall with left patellar and left distal clavicle fracture.  Recently had surgery of the right shoulder.   -Workup negative for acute CVA, as well as no significant findings on workup for TIA, so it is unlikely as well, carotid Dopplers reassuring -Patient main issue is right side lower extremity weakness upon trying to stand and to ambulate, most likely in the setting of her severe arthritis in the right knee causing this issue, current management will be continue with PT-OT.   1 bottle of blood culture growing Corynebacterium, gram-positive rods,  - contaminant, but given she has a pacemaker, I did repeat blood cultures, which did remain negative through hospital stay.   B12 deficiency -B12 level is borderline at 327,  on IM supplements, please recheck level in 4 to 6 weeks as an outpatient.   AKI on CKD stage II: -  Baseline creatinine around 1.1.  Is on diuretics and was hypotensive, though her diuretics has been held during hospital stay, as well her Coreg, renal function has recovered, blood pressure has improved, Coreg was resumed, and her diuresis dose has been decreased,  -Home Dose of torsemide 100 mg p.o. twice daily, this has been decreased to 60 mg oral daily, Aldactone was resumed at home dose of 25 mg oral daily.     Chronic hypotension:  -On midodrine therapy.    Anemia of chronic kidney disease Iron deficiency anemia Hemoccult positive stool -Overall hemoglobin has been trending down, heme occult positive stool, GI input greatly appreciated, she went for EGD 10/20, which was significant for mild gastritis, recommendation for prolonged PPI therapy, okay to resume Eliquis today as discussed with GI , patient has declined colonoscopy. -Monitor CBC closely  Study Patient Name:  Katelyn Jackson  Date of Exam:   08/22/2023 Medical Rec #: [id number]          Accession #:    [id number] Date of Birth: October 05, 1946          Patient Gender: F Patient Age:   32 years Exam Location:  [facility] Procedure:      VAS US CAROTID Referring Phys: [facility] --------------------------------------------------------------------------------  Indications:       TIA. Risk Factors:      Hypertension, hyperlipidemia, no history of smoking. Other Factors:     Afib, PM. Limitations        Today's exam was limited due to the body habitus of the                    patient and tortuous vessels. Comparison Study:  No previous exams Performing Technologist: Jody Hill RVT, RDMS  Examination Guidelines: A complete evaluation includes B-mode imaging, spectral Doppler, color Doppler, and power Doppler as needed of all accessible portions of each vessel. Bilateral testing is considered an integral part of a complete examination. Limited  examinations for reoccurring indications may be performed as noted.  Right Carotid Findings: +----------+--------+--------+--------+------------------+--------+           PSV cm/sEDV cm/sStenosisPlaque DescriptionComments +----------+--------+--------+--------+------------------+--------+ CCA Prox  76      12                                tortuous +----------+--------+--------+--------+------------------+--------+ CCA Distal89      15                                         +----------+--------+--------+--------+------------------+--------+ ICA Prox  85      24                                         +----------+--------+--------+--------+------------------+--------+ ICA Distal79      25                                tortuous +----------+--------+--------+--------+------------------+--------+ ECA       66      10                                         +----------+--------+--------+--------+------------------+--------+ +----------+--------+-------+----------------+-------------------+           PSV cm/sEDV cmsDescribe        Arm Pressure (mmHG) +----------+--------+-------+----------------+-------------------+ Subclavian100            Multiphasic, WNL                    +----------+--------+-------+----------------+-------------------+ +---------+--------+--+--------+--+---------+ VertebralPSV cm/s58EDV cm/s14Antegrade +---------+--------+--+--------+--+---------+  Left Carotid Findings: +----------+--------+--------+--------+------------------+------------------+           PSV cm/sEDV cm/sStenosisPlaque DescriptionComments           +----------+--------+--------+--------+------------------+------------------+ CCA Prox  81      15                                                   +----------+--------+--------+--------+------------------+------------------+  Physician Discharge Summary  Katelyn Jackson ZOX:[id number] DOB: [birth year] DOA: 08/20/2023  PCP: Tresa Garter, MD  Admit date: 08/20/2023 Discharge date: 08/28/2023  Admitted From: (Home) Disposition:  (SNF)  Recommendations for Outpatient Follow-up:  Please obtain BMP/CBC in one week Check B12 level 4 to 6 weeks Please monitor her volume status and adjust diuresis as needed,  as her diuretics has been significantly decreased at time of discharge from her home baseline, please see discussion below   Diet recommendation: Heart Healthy  Brief/Interim Summary:   73 Russian-speaking female with history of permanent A-fib on anticoagulation, status post pacemaker, decompensated liver cirrhosis, CKD stage 2 hypertension, hyperlipidemia, hypothyroidism, chronic lymphedema, history of breast cancer status post radiation therapy, recent trauma with fracture of left upper and lower extremities, wheelchair dependent who was brought from home after a level 2 trauma following ground-level fall.  She lives with her son.  Patient noted right-sided numbness involving her right face, arm and leg with left-sided weakness before fall.  Suspected stroke on admission but MRI of the brain and cervical spine did not show any acute findings.  She was admitted for further workup, workup significant for ESP L UTI, and iron deficiency anemia, please see discussion below.  Fall/ambulatory dysfunction: -Patient with multiple falls in the past, multiple hospitalization, rehab placement, main concern is right side weakness, unsteady gait, mainly upon standing up. -Extensive workup, loading MRI brain, cervical MRI, MRI lumbar spine, CT right hip. Negative MRI of the brain, cervical spine did not show any acute findings but showed mild C5-6 spinal canal stenosis,moderate bilateral C3-4 and C4-5 neural foraminal stenosis.  Her skeletal survey did not show any acute findings.  Right CT head with no acute  findings. - Recent hospitalization from  07/10 to 05/24/23 for mechanical fall with left patellar and left distal clavicle fracture.  Recently had surgery of the right shoulder.   -Workup negative for acute CVA, as well as no significant findings on workup for TIA, so it is unlikely as well, carotid Dopplers reassuring -Patient main issue is right side lower extremity weakness upon trying to stand and to ambulate, most likely in the setting of her severe arthritis in the right knee causing this issue, current management will be continue with PT-OT.   1 bottle of blood culture growing Corynebacterium, gram-positive rods,  - contaminant, but given she has a pacemaker, I did repeat blood cultures, which did remain negative through hospital stay.   B12 deficiency -B12 level is borderline at 327,  on IM supplements, please recheck level in 4 to 6 weeks as an outpatient.   AKI on CKD stage II: -  Baseline creatinine around 1.1.  Is on diuretics and was hypotensive, though her diuretics has been held during hospital stay, as well her Coreg, renal function has recovered, blood pressure has improved, Coreg was resumed, and her diuresis dose has been decreased,  -Home Dose of torsemide 100 mg p.o. twice daily, this has been decreased to 60 mg oral daily, Aldactone was resumed at home dose of 25 mg oral daily.     Chronic hypotension:  -On midodrine therapy.    Anemia of chronic kidney disease Iron deficiency anemia Hemoccult positive stool -Overall hemoglobin has been trending down, heme occult positive stool, GI input greatly appreciated, she went for EGD 10/20, which was significant for mild gastritis, recommendation for prolonged PPI therapy, okay to resume Eliquis today as discussed with GI , patient has declined colonoscopy. -Monitor CBC closely  CCA Distal66      17                                intimal thickening  +----------+--------+--------+--------+------------------+------------------+ ICA Prox  65      16              calcific                             +----------+--------+--------+--------+------------------+------------------+ ICA Distal98      34              calcific          tortuous           +----------+--------+--------+--------+------------------+------------------+ +----------+--------+--------+----------------+-------------------+           PSV cm/sEDV cm/sDescribe        Arm Pressure (mmHG) +----------+--------+--------+----------------+-------------------+ [id number]             Multiphasic, WNL                    +----------+--------+--------+----------------+-------------------+ +---------+--------+--+--------+--+---------+ VertebralPSV cm/s60EDV cm/s14Antegrade +---------+--------+--+--------+--+---------+   Summary: Right Carotid: The extracranial vessels were near-normal with only minimal wall                thickening or plaque. Left Carotid: The extracranial vessels were near-normal with only minimal wall               thickening or plaque. Vertebrals:  Bilateral vertebral arteries demonstrate antegrade flow. Subclavians: Normal flow hemodynamics were seen in bilateral subclavian              arteries. *See table(s) above for measurements and observations.  Electronically signed by Delia Heady MD on 08/23/2023 at 6:06:50 PM.    Final    ECHOCARDIOGRAM COMPLETE  Result Date: 08/23/2023    ECHOCARDIOGRAM REPORT   Patient Name:   Katelyn Jackson Date of Exam: 08/23/2023 Medical Rec #:  [id number]         Height:       61.0 in Accession #:    [id number]        Weight:       202.4 lb Date of Birth:  12-28-1945         BSA:          1.898 m Patient Age:    77 years          BP:           133/63 mmHg Patient Gender: F                 HR:           66 bpm. Exam Location:  Inpatient Procedure: 2D Echo, Cardiac Doppler and Color Doppler Indications:    CHF I50.31   History:        Patient has prior history of Echocardiogram examinations, most                 recent 03/19/2021. CHF, Pacemaker, Pulmonary HTN,                 Arrythmias:Atrial Fibrillation; Risk Factors:Hypertension,                 Dyslipidemia and Non-Smoker.  Sonographer:    Dondra Prader RVT RCS Referring Phys: [id number] [facility] IMPRESSIONS  1. Left ventricular ejection fraction, by estimation, is 40 to 45%. The  Physician Discharge Summary  Katelyn Jackson ZOX:[id number] DOB: [birth year] DOA: 08/20/2023  PCP: Tresa Garter, MD  Admit date: 08/20/2023 Discharge date: 08/28/2023  Admitted From: (Home) Disposition:  (SNF)  Recommendations for Outpatient Follow-up:  Please obtain BMP/CBC in one week Check B12 level 4 to 6 weeks Please monitor her volume status and adjust diuresis as needed,  as her diuretics has been significantly decreased at time of discharge from her home baseline, please see discussion below   Diet recommendation: Heart Healthy  Brief/Interim Summary:   73 Russian-speaking female with history of permanent A-fib on anticoagulation, status post pacemaker, decompensated liver cirrhosis, CKD stage 2 hypertension, hyperlipidemia, hypothyroidism, chronic lymphedema, history of breast cancer status post radiation therapy, recent trauma with fracture of left upper and lower extremities, wheelchair dependent who was brought from home after a level 2 trauma following ground-level fall.  She lives with her son.  Patient noted right-sided numbness involving her right face, arm and leg with left-sided weakness before fall.  Suspected stroke on admission but MRI of the brain and cervical spine did not show any acute findings.  She was admitted for further workup, workup significant for ESP L UTI, and iron deficiency anemia, please see discussion below.  Fall/ambulatory dysfunction: -Patient with multiple falls in the past, multiple hospitalization, rehab placement, main concern is right side weakness, unsteady gait, mainly upon standing up. -Extensive workup, loading MRI brain, cervical MRI, MRI lumbar spine, CT right hip. Negative MRI of the brain, cervical spine did not show any acute findings but showed mild C5-6 spinal canal stenosis,moderate bilateral C3-4 and C4-5 neural foraminal stenosis.  Her skeletal survey did not show any acute findings.  Right CT head with no acute  findings. - Recent hospitalization from  07/10 to 05/24/23 for mechanical fall with left patellar and left distal clavicle fracture.  Recently had surgery of the right shoulder.   -Workup negative for acute CVA, as well as no significant findings on workup for TIA, so it is unlikely as well, carotid Dopplers reassuring -Patient main issue is right side lower extremity weakness upon trying to stand and to ambulate, most likely in the setting of her severe arthritis in the right knee causing this issue, current management will be continue with PT-OT.   1 bottle of blood culture growing Corynebacterium, gram-positive rods,  - contaminant, but given she has a pacemaker, I did repeat blood cultures, which did remain negative through hospital stay.   B12 deficiency -B12 level is borderline at 327,  on IM supplements, please recheck level in 4 to 6 weeks as an outpatient.   AKI on CKD stage II: -  Baseline creatinine around 1.1.  Is on diuretics and was hypotensive, though her diuretics has been held during hospital stay, as well her Coreg, renal function has recovered, blood pressure has improved, Coreg was resumed, and her diuresis dose has been decreased,  -Home Dose of torsemide 100 mg p.o. twice daily, this has been decreased to 60 mg oral daily, Aldactone was resumed at home dose of 25 mg oral daily.     Chronic hypotension:  -On midodrine therapy.    Anemia of chronic kidney disease Iron deficiency anemia Hemoccult positive stool -Overall hemoglobin has been trending down, heme occult positive stool, GI input greatly appreciated, she went for EGD 10/20, which was significant for mild gastritis, recommendation for prolonged PPI therapy, okay to resume Eliquis today as discussed with GI , patient has declined colonoscopy. -Monitor CBC closely  Study Patient Name:  Katelyn Jackson  Date of Exam:   08/22/2023 Medical Rec #: [id number]          Accession #:    [id number] Date of Birth: October 05, 1946          Patient Gender: F Patient Age:   32 years Exam Location:  [facility] Procedure:      VAS US CAROTID Referring Phys: [facility] --------------------------------------------------------------------------------  Indications:       TIA. Risk Factors:      Hypertension, hyperlipidemia, no history of smoking. Other Factors:     Afib, PM. Limitations        Today's exam was limited due to the body habitus of the                    patient and tortuous vessels. Comparison Study:  No previous exams Performing Technologist: Jody Hill RVT, RDMS  Examination Guidelines: A complete evaluation includes B-mode imaging, spectral Doppler, color Doppler, and power Doppler as needed of all accessible portions of each vessel. Bilateral testing is considered an integral part of a complete examination. Limited  examinations for reoccurring indications may be performed as noted.  Right Carotid Findings: +----------+--------+--------+--------+------------------+--------+           PSV cm/sEDV cm/sStenosisPlaque DescriptionComments +----------+--------+--------+--------+------------------+--------+ CCA Prox  76      12                                tortuous +----------+--------+--------+--------+------------------+--------+ CCA Distal89      15                                         +----------+--------+--------+--------+------------------+--------+ ICA Prox  85      24                                         +----------+--------+--------+--------+------------------+--------+ ICA Distal79      25                                tortuous +----------+--------+--------+--------+------------------+--------+ ECA       66      10                                         +----------+--------+--------+--------+------------------+--------+ +----------+--------+-------+----------------+-------------------+           PSV cm/sEDV cmsDescribe        Arm Pressure (mmHG) +----------+--------+-------+----------------+-------------------+ Subclavian100            Multiphasic, WNL                    +----------+--------+-------+----------------+-------------------+ +---------+--------+--+--------+--+---------+ VertebralPSV cm/s58EDV cm/s14Antegrade +---------+--------+--+--------+--+---------+  Left Carotid Findings: +----------+--------+--------+--------+------------------+------------------+           PSV cm/sEDV cm/sStenosisPlaque DescriptionComments           +----------+--------+--------+--------+------------------+------------------+ CCA Prox  81      15                                                   +----------+--------+--------+--------+------------------+------------------+  Study Patient Name:  Katelyn Jackson  Date of Exam:   08/22/2023 Medical Rec #: [id number]          Accession #:    [id number] Date of Birth: October 05, 1946          Patient Gender: F Patient Age:   32 years Exam Location:  [facility] Procedure:      VAS US CAROTID Referring Phys: [facility] --------------------------------------------------------------------------------  Indications:       TIA. Risk Factors:      Hypertension, hyperlipidemia, no history of smoking. Other Factors:     Afib, PM. Limitations        Today's exam was limited due to the body habitus of the                    patient and tortuous vessels. Comparison Study:  No previous exams Performing Technologist: Jody Hill RVT, RDMS  Examination Guidelines: A complete evaluation includes B-mode imaging, spectral Doppler, color Doppler, and power Doppler as needed of all accessible portions of each vessel. Bilateral testing is considered an integral part of a complete examination. Limited  examinations for reoccurring indications may be performed as noted.  Right Carotid Findings: +----------+--------+--------+--------+------------------+--------+           PSV cm/sEDV cm/sStenosisPlaque DescriptionComments +----------+--------+--------+--------+------------------+--------+ CCA Prox  76      12                                tortuous +----------+--------+--------+--------+------------------+--------+ CCA Distal89      15                                         +----------+--------+--------+--------+------------------+--------+ ICA Prox  85      24                                         +----------+--------+--------+--------+------------------+--------+ ICA Distal79      25                                tortuous +----------+--------+--------+--------+------------------+--------+ ECA       66      10                                         +----------+--------+--------+--------+------------------+--------+ +----------+--------+-------+----------------+-------------------+           PSV cm/sEDV cmsDescribe        Arm Pressure (mmHG) +----------+--------+-------+----------------+-------------------+ Subclavian100            Multiphasic, WNL                    +----------+--------+-------+----------------+-------------------+ +---------+--------+--+--------+--+---------+ VertebralPSV cm/s58EDV cm/s14Antegrade +---------+--------+--+--------+--+---------+  Left Carotid Findings: +----------+--------+--------+--------+------------------+------------------+           PSV cm/sEDV cm/sStenosisPlaque DescriptionComments           +----------+--------+--------+--------+------------------+------------------+ CCA Prox  81      15                                                   +----------+--------+--------+--------+------------------+------------------+  CCA Distal66      17                                intimal thickening  +----------+--------+--------+--------+------------------+------------------+ ICA Prox  65      16              calcific                             +----------+--------+--------+--------+------------------+------------------+ ICA Distal98      34              calcific          tortuous           +----------+--------+--------+--------+------------------+------------------+ +----------+--------+--------+----------------+-------------------+           PSV cm/sEDV cm/sDescribe        Arm Pressure (mmHG) +----------+--------+--------+----------------+-------------------+ [id number]             Multiphasic, WNL                    +----------+--------+--------+----------------+-------------------+ +---------+--------+--+--------+--+---------+ VertebralPSV cm/s60EDV cm/s14Antegrade +---------+--------+--+--------+--+---------+   Summary: Right Carotid: The extracranial vessels were near-normal with only minimal wall                thickening or plaque. Left Carotid: The extracranial vessels were near-normal with only minimal wall               thickening or plaque. Vertebrals:  Bilateral vertebral arteries demonstrate antegrade flow. Subclavians: Normal flow hemodynamics were seen in bilateral subclavian              arteries. *See table(s) above for measurements and observations.  Electronically signed by Delia Heady MD on 08/23/2023 at 6:06:50 PM.    Final    ECHOCARDIOGRAM COMPLETE  Result Date: 08/23/2023    ECHOCARDIOGRAM REPORT   Patient Name:   Katelyn Jackson Date of Exam: 08/23/2023 Medical Rec #:  [id number]         Height:       61.0 in Accession #:    [id number]        Weight:       202.4 lb Date of Birth:  12-28-1945         BSA:          1.898 m Patient Age:    77 years          BP:           133/63 mmHg Patient Gender: F                 HR:           66 bpm. Exam Location:  Inpatient Procedure: 2D Echo, Cardiac Doppler and Color Doppler Indications:    CHF I50.31   History:        Patient has prior history of Echocardiogram examinations, most                 recent 03/19/2021. CHF, Pacemaker, Pulmonary HTN,                 Arrythmias:Atrial Fibrillation; Risk Factors:Hypertension,                 Dyslipidemia and Non-Smoker.  Sonographer:    Dondra Prader RVT RCS Referring Phys: [id number] [facility] IMPRESSIONS  1. Left ventricular ejection fraction, by estimation, is 40 to 45%. The  Study Patient Name:  Katelyn Jackson  Date of Exam:   08/22/2023 Medical Rec #: [id number]          Accession #:    [id number] Date of Birth: October 05, 1946          Patient Gender: F Patient Age:   32 years Exam Location:  [facility] Procedure:      VAS US CAROTID Referring Phys: [facility] --------------------------------------------------------------------------------  Indications:       TIA. Risk Factors:      Hypertension, hyperlipidemia, no history of smoking. Other Factors:     Afib, PM. Limitations        Today's exam was limited due to the body habitus of the                    patient and tortuous vessels. Comparison Study:  No previous exams Performing Technologist: Jody Hill RVT, RDMS  Examination Guidelines: A complete evaluation includes B-mode imaging, spectral Doppler, color Doppler, and power Doppler as needed of all accessible portions of each vessel. Bilateral testing is considered an integral part of a complete examination. Limited  examinations for reoccurring indications may be performed as noted.  Right Carotid Findings: +----------+--------+--------+--------+------------------+--------+           PSV cm/sEDV cm/sStenosisPlaque DescriptionComments +----------+--------+--------+--------+------------------+--------+ CCA Prox  76      12                                tortuous +----------+--------+--------+--------+------------------+--------+ CCA Distal89      15                                         +----------+--------+--------+--------+------------------+--------+ ICA Prox  85      24                                         +----------+--------+--------+--------+------------------+--------+ ICA Distal79      25                                tortuous +----------+--------+--------+--------+------------------+--------+ ECA       66      10                                         +----------+--------+--------+--------+------------------+--------+ +----------+--------+-------+----------------+-------------------+           PSV cm/sEDV cmsDescribe        Arm Pressure (mmHG) +----------+--------+-------+----------------+-------------------+ Subclavian100            Multiphasic, WNL                    +----------+--------+-------+----------------+-------------------+ +---------+--------+--+--------+--+---------+ VertebralPSV cm/s58EDV cm/s14Antegrade +---------+--------+--+--------+--+---------+  Left Carotid Findings: +----------+--------+--------+--------+------------------+------------------+           PSV cm/sEDV cm/sStenosisPlaque DescriptionComments           +----------+--------+--------+--------+------------------+------------------+ CCA Prox  81      15                                                   +----------+--------+--------+--------+------------------+------------------+

## 2023-08-28 NOTE — Plan of Care (Signed)
  Problem: Education: Goal: Knowledge of General Education information will improve Description Including pain rating scale, medication(s)/side effects and non-pharmacologic comfort measures Outcome: Progressing   Problem: Health Behavior/Discharge Planning: Goal: Ability to manage health-related needs will improve Outcome: Progressing   

## 2023-08-29 DIAGNOSIS — Z466 Encounter for fitting and adjustment of urinary device: Secondary | ICD-10-CM | POA: Diagnosis not present

## 2023-08-29 DIAGNOSIS — D649 Anemia, unspecified: Secondary | ICD-10-CM | POA: Diagnosis not present

## 2023-08-29 DIAGNOSIS — M6281 Muscle weakness (generalized): Secondary | ICD-10-CM | POA: Diagnosis not present

## 2023-08-29 DIAGNOSIS — R531 Weakness: Secondary | ICD-10-CM | POA: Diagnosis not present

## 2023-08-29 DIAGNOSIS — R296 Repeated falls: Secondary | ICD-10-CM | POA: Diagnosis not present

## 2023-08-29 DIAGNOSIS — N3000 Acute cystitis without hematuria: Secondary | ICD-10-CM | POA: Diagnosis not present

## 2023-08-29 DIAGNOSIS — R2990 Unspecified symptoms and signs involving the nervous system: Secondary | ICD-10-CM | POA: Diagnosis not present

## 2023-08-29 DIAGNOSIS — E538 Deficiency of other specified B group vitamins: Secondary | ICD-10-CM | POA: Diagnosis not present

## 2023-08-29 DIAGNOSIS — N179 Acute kidney failure, unspecified: Secondary | ICD-10-CM | POA: Diagnosis not present

## 2023-08-29 DIAGNOSIS — N189 Chronic kidney disease, unspecified: Secondary | ICD-10-CM | POA: Diagnosis not present

## 2023-08-29 DIAGNOSIS — R5381 Other malaise: Secondary | ICD-10-CM | POA: Diagnosis not present

## 2023-08-29 DIAGNOSIS — I509 Heart failure, unspecified: Secondary | ICD-10-CM | POA: Diagnosis not present

## 2023-08-29 DIAGNOSIS — Z4789 Encounter for other orthopedic aftercare: Secondary | ICD-10-CM | POA: Diagnosis not present

## 2023-08-29 DIAGNOSIS — I4891 Unspecified atrial fibrillation: Secondary | ICD-10-CM | POA: Diagnosis not present

## 2023-08-29 DIAGNOSIS — N390 Urinary tract infection, site not specified: Secondary | ICD-10-CM | POA: Diagnosis not present

## 2023-08-29 LAB — CULTURE, BLOOD (ROUTINE X 2)
Culture: NO GROWTH
Culture: NO GROWTH
Special Requests: ADEQUATE
Special Requests: ADEQUATE

## 2023-08-30 ENCOUNTER — Encounter (HOSPITAL_COMMUNITY): Payer: Self-pay | Admitting: Gastroenterology

## 2023-08-30 DIAGNOSIS — N3000 Acute cystitis without hematuria: Secondary | ICD-10-CM | POA: Diagnosis not present

## 2023-08-30 DIAGNOSIS — N179 Acute kidney failure, unspecified: Secondary | ICD-10-CM | POA: Diagnosis not present

## 2023-08-30 DIAGNOSIS — M6281 Muscle weakness (generalized): Secondary | ICD-10-CM | POA: Diagnosis not present

## 2023-08-30 DIAGNOSIS — Z466 Encounter for fitting and adjustment of urinary device: Secondary | ICD-10-CM | POA: Diagnosis not present

## 2023-08-30 DIAGNOSIS — R531 Weakness: Secondary | ICD-10-CM | POA: Diagnosis not present

## 2023-08-30 DIAGNOSIS — Z4789 Encounter for other orthopedic aftercare: Secondary | ICD-10-CM | POA: Diagnosis not present

## 2023-08-30 DIAGNOSIS — R2990 Unspecified symptoms and signs involving the nervous system: Secondary | ICD-10-CM | POA: Diagnosis not present

## 2023-08-30 LAB — SURGICAL PATHOLOGY

## 2023-08-31 DIAGNOSIS — Z466 Encounter for fitting and adjustment of urinary device: Secondary | ICD-10-CM | POA: Diagnosis not present

## 2023-08-31 DIAGNOSIS — N3000 Acute cystitis without hematuria: Secondary | ICD-10-CM | POA: Diagnosis not present

## 2023-08-31 DIAGNOSIS — M6281 Muscle weakness (generalized): Secondary | ICD-10-CM | POA: Diagnosis not present

## 2023-08-31 DIAGNOSIS — R2990 Unspecified symptoms and signs involving the nervous system: Secondary | ICD-10-CM | POA: Diagnosis not present

## 2023-08-31 DIAGNOSIS — Z4789 Encounter for other orthopedic aftercare: Secondary | ICD-10-CM | POA: Diagnosis not present

## 2023-08-31 DIAGNOSIS — R531 Weakness: Secondary | ICD-10-CM | POA: Diagnosis not present

## 2023-08-31 DIAGNOSIS — N179 Acute kidney failure, unspecified: Secondary | ICD-10-CM | POA: Diagnosis not present

## 2023-09-01 DIAGNOSIS — R531 Weakness: Secondary | ICD-10-CM | POA: Diagnosis not present

## 2023-09-01 DIAGNOSIS — Z466 Encounter for fitting and adjustment of urinary device: Secondary | ICD-10-CM | POA: Diagnosis not present

## 2023-09-01 DIAGNOSIS — I4891 Unspecified atrial fibrillation: Secondary | ICD-10-CM | POA: Diagnosis not present

## 2023-09-01 DIAGNOSIS — R2990 Unspecified symptoms and signs involving the nervous system: Secondary | ICD-10-CM | POA: Diagnosis not present

## 2023-09-01 DIAGNOSIS — N3000 Acute cystitis without hematuria: Secondary | ICD-10-CM | POA: Diagnosis not present

## 2023-09-01 DIAGNOSIS — M6281 Muscle weakness (generalized): Secondary | ICD-10-CM | POA: Diagnosis not present

## 2023-09-01 DIAGNOSIS — N179 Acute kidney failure, unspecified: Secondary | ICD-10-CM | POA: Diagnosis not present

## 2023-09-01 DIAGNOSIS — E039 Hypothyroidism, unspecified: Secondary | ICD-10-CM | POA: Diagnosis not present

## 2023-09-01 DIAGNOSIS — Z4789 Encounter for other orthopedic aftercare: Secondary | ICD-10-CM | POA: Diagnosis not present

## 2023-09-01 DIAGNOSIS — N390 Urinary tract infection, site not specified: Secondary | ICD-10-CM | POA: Diagnosis not present

## 2023-09-01 DIAGNOSIS — I10 Essential (primary) hypertension: Secondary | ICD-10-CM | POA: Diagnosis not present

## 2023-09-01 DIAGNOSIS — I509 Heart failure, unspecified: Secondary | ICD-10-CM | POA: Diagnosis not present

## 2023-09-01 DIAGNOSIS — R296 Repeated falls: Secondary | ICD-10-CM | POA: Diagnosis not present

## 2023-09-02 DIAGNOSIS — R2990 Unspecified symptoms and signs involving the nervous system: Secondary | ICD-10-CM | POA: Diagnosis not present

## 2023-09-02 DIAGNOSIS — Z4789 Encounter for other orthopedic aftercare: Secondary | ICD-10-CM | POA: Diagnosis not present

## 2023-09-02 DIAGNOSIS — N179 Acute kidney failure, unspecified: Secondary | ICD-10-CM | POA: Diagnosis not present

## 2023-09-02 DIAGNOSIS — R531 Weakness: Secondary | ICD-10-CM | POA: Diagnosis not present

## 2023-09-02 DIAGNOSIS — Z466 Encounter for fitting and adjustment of urinary device: Secondary | ICD-10-CM | POA: Diagnosis not present

## 2023-09-02 DIAGNOSIS — M6281 Muscle weakness (generalized): Secondary | ICD-10-CM | POA: Diagnosis not present

## 2023-09-02 DIAGNOSIS — N3000 Acute cystitis without hematuria: Secondary | ICD-10-CM | POA: Diagnosis not present

## 2023-09-04 DIAGNOSIS — I110 Hypertensive heart disease with heart failure: Secondary | ICD-10-CM | POA: Diagnosis not present

## 2023-09-04 DIAGNOSIS — R2990 Unspecified symptoms and signs involving the nervous system: Secondary | ICD-10-CM | POA: Diagnosis not present

## 2023-09-04 DIAGNOSIS — Z466 Encounter for fitting and adjustment of urinary device: Secondary | ICD-10-CM | POA: Diagnosis not present

## 2023-09-04 DIAGNOSIS — D638 Anemia in other chronic diseases classified elsewhere: Secondary | ICD-10-CM | POA: Diagnosis not present

## 2023-09-04 DIAGNOSIS — N179 Acute kidney failure, unspecified: Secondary | ICD-10-CM | POA: Diagnosis not present

## 2023-09-04 DIAGNOSIS — Z4789 Encounter for other orthopedic aftercare: Secondary | ICD-10-CM | POA: Diagnosis not present

## 2023-09-04 DIAGNOSIS — R531 Weakness: Secondary | ICD-10-CM | POA: Diagnosis not present

## 2023-09-04 DIAGNOSIS — N3000 Acute cystitis without hematuria: Secondary | ICD-10-CM | POA: Diagnosis not present

## 2023-09-04 DIAGNOSIS — M6281 Muscle weakness (generalized): Secondary | ICD-10-CM | POA: Diagnosis not present

## 2023-09-05 DIAGNOSIS — I4891 Unspecified atrial fibrillation: Secondary | ICD-10-CM | POA: Diagnosis not present

## 2023-09-05 DIAGNOSIS — N390 Urinary tract infection, site not specified: Secondary | ICD-10-CM | POA: Diagnosis not present

## 2023-09-05 DIAGNOSIS — M109 Gout, unspecified: Secondary | ICD-10-CM | POA: Diagnosis not present

## 2023-09-05 DIAGNOSIS — R531 Weakness: Secondary | ICD-10-CM | POA: Diagnosis not present

## 2023-09-05 DIAGNOSIS — R2990 Unspecified symptoms and signs involving the nervous system: Secondary | ICD-10-CM | POA: Diagnosis not present

## 2023-09-05 DIAGNOSIS — R296 Repeated falls: Secondary | ICD-10-CM | POA: Diagnosis not present

## 2023-09-05 DIAGNOSIS — Z466 Encounter for fitting and adjustment of urinary device: Secondary | ICD-10-CM | POA: Diagnosis not present

## 2023-09-05 DIAGNOSIS — Z4789 Encounter for other orthopedic aftercare: Secondary | ICD-10-CM | POA: Diagnosis not present

## 2023-09-05 DIAGNOSIS — R5381 Other malaise: Secondary | ICD-10-CM | POA: Diagnosis not present

## 2023-09-05 DIAGNOSIS — D649 Anemia, unspecified: Secondary | ICD-10-CM | POA: Diagnosis not present

## 2023-09-05 DIAGNOSIS — M6281 Muscle weakness (generalized): Secondary | ICD-10-CM | POA: Diagnosis not present

## 2023-09-05 DIAGNOSIS — N189 Chronic kidney disease, unspecified: Secondary | ICD-10-CM | POA: Diagnosis not present

## 2023-09-05 DIAGNOSIS — N3000 Acute cystitis without hematuria: Secondary | ICD-10-CM | POA: Diagnosis not present

## 2023-09-05 DIAGNOSIS — E538 Deficiency of other specified B group vitamins: Secondary | ICD-10-CM | POA: Diagnosis not present

## 2023-09-05 DIAGNOSIS — N179 Acute kidney failure, unspecified: Secondary | ICD-10-CM | POA: Diagnosis not present

## 2023-09-06 ENCOUNTER — Telehealth: Payer: Self-pay | Admitting: Hematology

## 2023-09-06 DIAGNOSIS — Z4789 Encounter for other orthopedic aftercare: Secondary | ICD-10-CM | POA: Diagnosis not present

## 2023-09-06 DIAGNOSIS — R2990 Unspecified symptoms and signs involving the nervous system: Secondary | ICD-10-CM | POA: Diagnosis not present

## 2023-09-06 DIAGNOSIS — R531 Weakness: Secondary | ICD-10-CM | POA: Diagnosis not present

## 2023-09-06 DIAGNOSIS — N179 Acute kidney failure, unspecified: Secondary | ICD-10-CM | POA: Diagnosis not present

## 2023-09-06 DIAGNOSIS — Z466 Encounter for fitting and adjustment of urinary device: Secondary | ICD-10-CM | POA: Diagnosis not present

## 2023-09-06 DIAGNOSIS — M6281 Muscle weakness (generalized): Secondary | ICD-10-CM | POA: Diagnosis not present

## 2023-09-06 DIAGNOSIS — Z299 Encounter for prophylactic measures, unspecified: Secondary | ICD-10-CM | POA: Diagnosis not present

## 2023-09-06 DIAGNOSIS — N3000 Acute cystitis without hematuria: Secondary | ICD-10-CM | POA: Diagnosis not present

## 2023-09-07 DIAGNOSIS — R531 Weakness: Secondary | ICD-10-CM | POA: Diagnosis not present

## 2023-09-07 DIAGNOSIS — M6281 Muscle weakness (generalized): Secondary | ICD-10-CM | POA: Diagnosis not present

## 2023-09-07 DIAGNOSIS — Z4789 Encounter for other orthopedic aftercare: Secondary | ICD-10-CM | POA: Diagnosis not present

## 2023-09-07 DIAGNOSIS — R2990 Unspecified symptoms and signs involving the nervous system: Secondary | ICD-10-CM | POA: Diagnosis not present

## 2023-09-07 DIAGNOSIS — N179 Acute kidney failure, unspecified: Secondary | ICD-10-CM | POA: Diagnosis not present

## 2023-09-07 DIAGNOSIS — Z466 Encounter for fitting and adjustment of urinary device: Secondary | ICD-10-CM | POA: Diagnosis not present

## 2023-09-07 DIAGNOSIS — N3000 Acute cystitis without hematuria: Secondary | ICD-10-CM | POA: Diagnosis not present

## 2023-09-08 DIAGNOSIS — R2681 Unsteadiness on feet: Secondary | ICD-10-CM | POA: Diagnosis not present

## 2023-09-08 DIAGNOSIS — N3000 Acute cystitis without hematuria: Secondary | ICD-10-CM | POA: Diagnosis not present

## 2023-09-08 DIAGNOSIS — R531 Weakness: Secondary | ICD-10-CM | POA: Diagnosis not present

## 2023-09-08 DIAGNOSIS — N179 Acute kidney failure, unspecified: Secondary | ICD-10-CM | POA: Diagnosis not present

## 2023-09-08 DIAGNOSIS — Z4789 Encounter for other orthopedic aftercare: Secondary | ICD-10-CM | POA: Diagnosis not present

## 2023-09-08 DIAGNOSIS — R2990 Unspecified symptoms and signs involving the nervous system: Secondary | ICD-10-CM | POA: Diagnosis not present

## 2023-09-08 DIAGNOSIS — Z466 Encounter for fitting and adjustment of urinary device: Secondary | ICD-10-CM | POA: Diagnosis not present

## 2023-09-11 ENCOUNTER — Other Ambulatory Visit: Payer: Medicare HMO

## 2023-09-11 ENCOUNTER — Ambulatory Visit: Payer: Medicare HMO | Admitting: Hematology

## 2023-09-11 DIAGNOSIS — Z466 Encounter for fitting and adjustment of urinary device: Secondary | ICD-10-CM | POA: Diagnosis not present

## 2023-09-11 DIAGNOSIS — R2681 Unsteadiness on feet: Secondary | ICD-10-CM | POA: Diagnosis not present

## 2023-09-11 DIAGNOSIS — Z4789 Encounter for other orthopedic aftercare: Secondary | ICD-10-CM | POA: Diagnosis not present

## 2023-09-11 DIAGNOSIS — N179 Acute kidney failure, unspecified: Secondary | ICD-10-CM | POA: Diagnosis not present

## 2023-09-11 DIAGNOSIS — R2990 Unspecified symptoms and signs involving the nervous system: Secondary | ICD-10-CM | POA: Diagnosis not present

## 2023-09-11 DIAGNOSIS — N3000 Acute cystitis without hematuria: Secondary | ICD-10-CM | POA: Diagnosis not present

## 2023-09-11 DIAGNOSIS — R531 Weakness: Secondary | ICD-10-CM | POA: Diagnosis not present

## 2023-09-12 DIAGNOSIS — Z466 Encounter for fitting and adjustment of urinary device: Secondary | ICD-10-CM | POA: Diagnosis not present

## 2023-09-12 DIAGNOSIS — R531 Weakness: Secondary | ICD-10-CM | POA: Diagnosis not present

## 2023-09-12 DIAGNOSIS — Z4789 Encounter for other orthopedic aftercare: Secondary | ICD-10-CM | POA: Diagnosis not present

## 2023-09-12 DIAGNOSIS — N3000 Acute cystitis without hematuria: Secondary | ICD-10-CM | POA: Diagnosis not present

## 2023-09-12 DIAGNOSIS — N179 Acute kidney failure, unspecified: Secondary | ICD-10-CM | POA: Diagnosis not present

## 2023-09-12 DIAGNOSIS — R2990 Unspecified symptoms and signs involving the nervous system: Secondary | ICD-10-CM | POA: Diagnosis not present

## 2023-09-12 DIAGNOSIS — R2681 Unsteadiness on feet: Secondary | ICD-10-CM | POA: Diagnosis not present

## 2023-09-13 DIAGNOSIS — R2681 Unsteadiness on feet: Secondary | ICD-10-CM | POA: Diagnosis not present

## 2023-09-13 DIAGNOSIS — R531 Weakness: Secondary | ICD-10-CM | POA: Diagnosis not present

## 2023-09-13 DIAGNOSIS — N179 Acute kidney failure, unspecified: Secondary | ICD-10-CM | POA: Diagnosis not present

## 2023-09-13 DIAGNOSIS — R2990 Unspecified symptoms and signs involving the nervous system: Secondary | ICD-10-CM | POA: Diagnosis not present

## 2023-09-13 DIAGNOSIS — Z466 Encounter for fitting and adjustment of urinary device: Secondary | ICD-10-CM | POA: Diagnosis not present

## 2023-09-13 DIAGNOSIS — Z4789 Encounter for other orthopedic aftercare: Secondary | ICD-10-CM | POA: Diagnosis not present

## 2023-09-13 DIAGNOSIS — N3000 Acute cystitis without hematuria: Secondary | ICD-10-CM | POA: Diagnosis not present

## 2023-09-14 DIAGNOSIS — Z466 Encounter for fitting and adjustment of urinary device: Secondary | ICD-10-CM | POA: Diagnosis not present

## 2023-09-14 DIAGNOSIS — N179 Acute kidney failure, unspecified: Secondary | ICD-10-CM | POA: Diagnosis not present

## 2023-09-14 DIAGNOSIS — Z4789 Encounter for other orthopedic aftercare: Secondary | ICD-10-CM | POA: Diagnosis not present

## 2023-09-14 DIAGNOSIS — N3000 Acute cystitis without hematuria: Secondary | ICD-10-CM | POA: Diagnosis not present

## 2023-09-14 DIAGNOSIS — R2990 Unspecified symptoms and signs involving the nervous system: Secondary | ICD-10-CM | POA: Diagnosis not present

## 2023-09-14 DIAGNOSIS — R531 Weakness: Secondary | ICD-10-CM | POA: Diagnosis not present

## 2023-09-14 DIAGNOSIS — R2681 Unsteadiness on feet: Secondary | ICD-10-CM | POA: Diagnosis not present

## 2023-09-15 DIAGNOSIS — R2990 Unspecified symptoms and signs involving the nervous system: Secondary | ICD-10-CM | POA: Diagnosis not present

## 2023-09-15 DIAGNOSIS — R2681 Unsteadiness on feet: Secondary | ICD-10-CM | POA: Diagnosis not present

## 2023-09-15 DIAGNOSIS — N189 Chronic kidney disease, unspecified: Secondary | ICD-10-CM | POA: Diagnosis not present

## 2023-09-15 DIAGNOSIS — Z466 Encounter for fitting and adjustment of urinary device: Secondary | ICD-10-CM | POA: Diagnosis not present

## 2023-09-15 DIAGNOSIS — M4800 Spinal stenosis, site unspecified: Secondary | ICD-10-CM | POA: Diagnosis not present

## 2023-09-15 DIAGNOSIS — Z4789 Encounter for other orthopedic aftercare: Secondary | ICD-10-CM | POA: Diagnosis not present

## 2023-09-15 DIAGNOSIS — M25512 Pain in left shoulder: Secondary | ICD-10-CM | POA: Diagnosis not present

## 2023-09-15 DIAGNOSIS — R531 Weakness: Secondary | ICD-10-CM | POA: Diagnosis not present

## 2023-09-15 DIAGNOSIS — N3000 Acute cystitis without hematuria: Secondary | ICD-10-CM | POA: Diagnosis not present

## 2023-09-15 DIAGNOSIS — E785 Hyperlipidemia, unspecified: Secondary | ICD-10-CM | POA: Diagnosis not present

## 2023-09-15 DIAGNOSIS — M109 Gout, unspecified: Secondary | ICD-10-CM | POA: Diagnosis not present

## 2023-09-15 DIAGNOSIS — N179 Acute kidney failure, unspecified: Secondary | ICD-10-CM | POA: Diagnosis not present

## 2023-09-15 DIAGNOSIS — K5900 Constipation, unspecified: Secondary | ICD-10-CM | POA: Diagnosis not present

## 2023-09-15 DIAGNOSIS — I509 Heart failure, unspecified: Secondary | ICD-10-CM | POA: Diagnosis not present

## 2023-09-15 DIAGNOSIS — R5381 Other malaise: Secondary | ICD-10-CM | POA: Diagnosis not present

## 2023-09-15 DIAGNOSIS — E039 Hypothyroidism, unspecified: Secondary | ICD-10-CM | POA: Diagnosis not present

## 2023-09-17 DIAGNOSIS — N179 Acute kidney failure, unspecified: Secondary | ICD-10-CM | POA: Diagnosis not present

## 2023-09-17 DIAGNOSIS — R2681 Unsteadiness on feet: Secondary | ICD-10-CM | POA: Diagnosis not present

## 2023-09-17 DIAGNOSIS — R2990 Unspecified symptoms and signs involving the nervous system: Secondary | ICD-10-CM | POA: Diagnosis not present

## 2023-09-17 DIAGNOSIS — Z4789 Encounter for other orthopedic aftercare: Secondary | ICD-10-CM | POA: Diagnosis not present

## 2023-09-17 DIAGNOSIS — Z466 Encounter for fitting and adjustment of urinary device: Secondary | ICD-10-CM | POA: Diagnosis not present

## 2023-09-17 DIAGNOSIS — R531 Weakness: Secondary | ICD-10-CM | POA: Diagnosis not present

## 2023-09-17 DIAGNOSIS — N3000 Acute cystitis without hematuria: Secondary | ICD-10-CM | POA: Diagnosis not present

## 2023-09-18 DIAGNOSIS — Z466 Encounter for fitting and adjustment of urinary device: Secondary | ICD-10-CM | POA: Diagnosis not present

## 2023-09-18 DIAGNOSIS — Z4789 Encounter for other orthopedic aftercare: Secondary | ICD-10-CM | POA: Diagnosis not present

## 2023-09-18 DIAGNOSIS — R2990 Unspecified symptoms and signs involving the nervous system: Secondary | ICD-10-CM | POA: Diagnosis not present

## 2023-09-18 DIAGNOSIS — N179 Acute kidney failure, unspecified: Secondary | ICD-10-CM | POA: Diagnosis not present

## 2023-09-18 DIAGNOSIS — R2681 Unsteadiness on feet: Secondary | ICD-10-CM | POA: Diagnosis not present

## 2023-09-18 DIAGNOSIS — N3000 Acute cystitis without hematuria: Secondary | ICD-10-CM | POA: Diagnosis not present

## 2023-09-18 DIAGNOSIS — R531 Weakness: Secondary | ICD-10-CM | POA: Diagnosis not present

## 2023-09-19 DIAGNOSIS — Z466 Encounter for fitting and adjustment of urinary device: Secondary | ICD-10-CM | POA: Diagnosis not present

## 2023-09-19 DIAGNOSIS — R2990 Unspecified symptoms and signs involving the nervous system: Secondary | ICD-10-CM | POA: Diagnosis not present

## 2023-09-19 DIAGNOSIS — N3000 Acute cystitis without hematuria: Secondary | ICD-10-CM | POA: Diagnosis not present

## 2023-09-19 DIAGNOSIS — R2681 Unsteadiness on feet: Secondary | ICD-10-CM | POA: Diagnosis not present

## 2023-09-19 DIAGNOSIS — R531 Weakness: Secondary | ICD-10-CM | POA: Diagnosis not present

## 2023-09-19 DIAGNOSIS — Z4789 Encounter for other orthopedic aftercare: Secondary | ICD-10-CM | POA: Diagnosis not present

## 2023-09-19 DIAGNOSIS — N179 Acute kidney failure, unspecified: Secondary | ICD-10-CM | POA: Diagnosis not present

## 2023-09-20 DIAGNOSIS — R2990 Unspecified symptoms and signs involving the nervous system: Secondary | ICD-10-CM | POA: Diagnosis not present

## 2023-09-20 DIAGNOSIS — Z4789 Encounter for other orthopedic aftercare: Secondary | ICD-10-CM | POA: Diagnosis not present

## 2023-09-20 DIAGNOSIS — R2681 Unsteadiness on feet: Secondary | ICD-10-CM | POA: Diagnosis not present

## 2023-09-20 DIAGNOSIS — Z466 Encounter for fitting and adjustment of urinary device: Secondary | ICD-10-CM | POA: Diagnosis not present

## 2023-09-20 DIAGNOSIS — R531 Weakness: Secondary | ICD-10-CM | POA: Diagnosis not present

## 2023-09-20 DIAGNOSIS — N3000 Acute cystitis without hematuria: Secondary | ICD-10-CM | POA: Diagnosis not present

## 2023-09-20 DIAGNOSIS — N179 Acute kidney failure, unspecified: Secondary | ICD-10-CM | POA: Diagnosis not present

## 2023-09-21 DIAGNOSIS — Z466 Encounter for fitting and adjustment of urinary device: Secondary | ICD-10-CM | POA: Diagnosis not present

## 2023-09-21 DIAGNOSIS — R531 Weakness: Secondary | ICD-10-CM | POA: Diagnosis not present

## 2023-09-21 DIAGNOSIS — N3000 Acute cystitis without hematuria: Secondary | ICD-10-CM | POA: Diagnosis not present

## 2023-09-21 DIAGNOSIS — R2681 Unsteadiness on feet: Secondary | ICD-10-CM | POA: Diagnosis not present

## 2023-09-21 DIAGNOSIS — Z4789 Encounter for other orthopedic aftercare: Secondary | ICD-10-CM | POA: Diagnosis not present

## 2023-09-21 DIAGNOSIS — R2990 Unspecified symptoms and signs involving the nervous system: Secondary | ICD-10-CM | POA: Diagnosis not present

## 2023-09-21 DIAGNOSIS — N179 Acute kidney failure, unspecified: Secondary | ICD-10-CM | POA: Diagnosis not present

## 2023-09-22 DIAGNOSIS — Z4789 Encounter for other orthopedic aftercare: Secondary | ICD-10-CM | POA: Diagnosis not present

## 2023-09-22 DIAGNOSIS — N3000 Acute cystitis without hematuria: Secondary | ICD-10-CM | POA: Diagnosis not present

## 2023-09-22 DIAGNOSIS — N179 Acute kidney failure, unspecified: Secondary | ICD-10-CM | POA: Diagnosis not present

## 2023-09-22 DIAGNOSIS — Z466 Encounter for fitting and adjustment of urinary device: Secondary | ICD-10-CM | POA: Diagnosis not present

## 2023-09-22 DIAGNOSIS — R2990 Unspecified symptoms and signs involving the nervous system: Secondary | ICD-10-CM | POA: Diagnosis not present

## 2023-09-22 DIAGNOSIS — R531 Weakness: Secondary | ICD-10-CM | POA: Diagnosis not present

## 2023-09-22 DIAGNOSIS — R2681 Unsteadiness on feet: Secondary | ICD-10-CM | POA: Diagnosis not present

## 2023-09-25 DIAGNOSIS — Z466 Encounter for fitting and adjustment of urinary device: Secondary | ICD-10-CM | POA: Diagnosis not present

## 2023-09-25 DIAGNOSIS — E538 Deficiency of other specified B group vitamins: Secondary | ICD-10-CM | POA: Diagnosis not present

## 2023-09-25 DIAGNOSIS — N189 Chronic kidney disease, unspecified: Secondary | ICD-10-CM | POA: Diagnosis not present

## 2023-09-25 DIAGNOSIS — R5381 Other malaise: Secondary | ICD-10-CM | POA: Diagnosis not present

## 2023-09-25 DIAGNOSIS — Z4789 Encounter for other orthopedic aftercare: Secondary | ICD-10-CM | POA: Diagnosis not present

## 2023-09-25 DIAGNOSIS — R2681 Unsteadiness on feet: Secondary | ICD-10-CM | POA: Diagnosis not present

## 2023-09-25 DIAGNOSIS — N179 Acute kidney failure, unspecified: Secondary | ICD-10-CM | POA: Diagnosis not present

## 2023-09-25 DIAGNOSIS — N3000 Acute cystitis without hematuria: Secondary | ICD-10-CM | POA: Diagnosis not present

## 2023-09-25 DIAGNOSIS — R2990 Unspecified symptoms and signs involving the nervous system: Secondary | ICD-10-CM | POA: Diagnosis not present

## 2023-09-25 DIAGNOSIS — G8929 Other chronic pain: Secondary | ICD-10-CM | POA: Diagnosis not present

## 2023-09-25 DIAGNOSIS — M25512 Pain in left shoulder: Secondary | ICD-10-CM | POA: Diagnosis not present

## 2023-09-25 DIAGNOSIS — M109 Gout, unspecified: Secondary | ICD-10-CM | POA: Diagnosis not present

## 2023-09-25 DIAGNOSIS — I509 Heart failure, unspecified: Secondary | ICD-10-CM | POA: Diagnosis not present

## 2023-09-25 DIAGNOSIS — D519 Vitamin B12 deficiency anemia, unspecified: Secondary | ICD-10-CM | POA: Diagnosis not present

## 2023-09-25 DIAGNOSIS — D649 Anemia, unspecified: Secondary | ICD-10-CM | POA: Diagnosis not present

## 2023-09-25 DIAGNOSIS — R531 Weakness: Secondary | ICD-10-CM | POA: Diagnosis not present

## 2023-09-25 DIAGNOSIS — I4891 Unspecified atrial fibrillation: Secondary | ICD-10-CM | POA: Diagnosis not present

## 2023-09-26 DIAGNOSIS — R5381 Other malaise: Secondary | ICD-10-CM | POA: Diagnosis not present

## 2023-09-26 DIAGNOSIS — N189 Chronic kidney disease, unspecified: Secondary | ICD-10-CM | POA: Diagnosis not present

## 2023-09-26 DIAGNOSIS — G8929 Other chronic pain: Secondary | ICD-10-CM | POA: Diagnosis not present

## 2023-09-26 DIAGNOSIS — R2681 Unsteadiness on feet: Secondary | ICD-10-CM | POA: Diagnosis not present

## 2023-09-26 DIAGNOSIS — M4800 Spinal stenosis, site unspecified: Secondary | ICD-10-CM | POA: Diagnosis not present

## 2023-09-26 DIAGNOSIS — Z466 Encounter for fitting and adjustment of urinary device: Secondary | ICD-10-CM | POA: Diagnosis not present

## 2023-09-26 DIAGNOSIS — K5900 Constipation, unspecified: Secondary | ICD-10-CM | POA: Diagnosis not present

## 2023-09-26 DIAGNOSIS — R2990 Unspecified symptoms and signs involving the nervous system: Secondary | ICD-10-CM | POA: Diagnosis not present

## 2023-09-26 DIAGNOSIS — E039 Hypothyroidism, unspecified: Secondary | ICD-10-CM | POA: Diagnosis not present

## 2023-09-26 DIAGNOSIS — D649 Anemia, unspecified: Secondary | ICD-10-CM | POA: Diagnosis not present

## 2023-09-26 DIAGNOSIS — N179 Acute kidney failure, unspecified: Secondary | ICD-10-CM | POA: Diagnosis not present

## 2023-09-26 DIAGNOSIS — N3000 Acute cystitis without hematuria: Secondary | ICD-10-CM | POA: Diagnosis not present

## 2023-09-26 DIAGNOSIS — Z4789 Encounter for other orthopedic aftercare: Secondary | ICD-10-CM | POA: Diagnosis not present

## 2023-09-26 DIAGNOSIS — R531 Weakness: Secondary | ICD-10-CM | POA: Diagnosis not present

## 2023-09-26 DIAGNOSIS — I4891 Unspecified atrial fibrillation: Secondary | ICD-10-CM | POA: Diagnosis not present

## 2023-09-26 DIAGNOSIS — M25512 Pain in left shoulder: Secondary | ICD-10-CM | POA: Diagnosis not present

## 2023-09-27 DIAGNOSIS — R2681 Unsteadiness on feet: Secondary | ICD-10-CM | POA: Diagnosis not present

## 2023-09-27 DIAGNOSIS — R531 Weakness: Secondary | ICD-10-CM | POA: Diagnosis not present

## 2023-09-27 DIAGNOSIS — Z466 Encounter for fitting and adjustment of urinary device: Secondary | ICD-10-CM | POA: Diagnosis not present

## 2023-09-27 DIAGNOSIS — N179 Acute kidney failure, unspecified: Secondary | ICD-10-CM | POA: Diagnosis not present

## 2023-09-27 DIAGNOSIS — R2990 Unspecified symptoms and signs involving the nervous system: Secondary | ICD-10-CM | POA: Diagnosis not present

## 2023-09-27 DIAGNOSIS — Z4789 Encounter for other orthopedic aftercare: Secondary | ICD-10-CM | POA: Diagnosis not present

## 2023-09-27 DIAGNOSIS — N3000 Acute cystitis without hematuria: Secondary | ICD-10-CM | POA: Diagnosis not present

## 2023-09-28 DIAGNOSIS — R2681 Unsteadiness on feet: Secondary | ICD-10-CM | POA: Diagnosis not present

## 2023-09-28 DIAGNOSIS — R531 Weakness: Secondary | ICD-10-CM | POA: Diagnosis not present

## 2023-09-28 DIAGNOSIS — N189 Chronic kidney disease, unspecified: Secondary | ICD-10-CM | POA: Diagnosis not present

## 2023-09-28 DIAGNOSIS — R2990 Unspecified symptoms and signs involving the nervous system: Secondary | ICD-10-CM | POA: Diagnosis not present

## 2023-09-28 DIAGNOSIS — N179 Acute kidney failure, unspecified: Secondary | ICD-10-CM | POA: Diagnosis not present

## 2023-09-28 DIAGNOSIS — Z466 Encounter for fitting and adjustment of urinary device: Secondary | ICD-10-CM | POA: Diagnosis not present

## 2023-09-28 DIAGNOSIS — N3000 Acute cystitis without hematuria: Secondary | ICD-10-CM | POA: Diagnosis not present

## 2023-09-28 DIAGNOSIS — I509 Heart failure, unspecified: Secondary | ICD-10-CM | POA: Diagnosis not present

## 2023-09-28 DIAGNOSIS — D649 Anemia, unspecified: Secondary | ICD-10-CM | POA: Diagnosis not present

## 2023-09-28 DIAGNOSIS — I4891 Unspecified atrial fibrillation: Secondary | ICD-10-CM | POA: Diagnosis not present

## 2023-09-28 DIAGNOSIS — Z4789 Encounter for other orthopedic aftercare: Secondary | ICD-10-CM | POA: Diagnosis not present

## 2023-09-29 DIAGNOSIS — Z4789 Encounter for other orthopedic aftercare: Secondary | ICD-10-CM | POA: Diagnosis not present

## 2023-09-29 DIAGNOSIS — N3000 Acute cystitis without hematuria: Secondary | ICD-10-CM | POA: Diagnosis not present

## 2023-09-29 DIAGNOSIS — N179 Acute kidney failure, unspecified: Secondary | ICD-10-CM | POA: Diagnosis not present

## 2023-09-29 DIAGNOSIS — R531 Weakness: Secondary | ICD-10-CM | POA: Diagnosis not present

## 2023-09-29 DIAGNOSIS — R2990 Unspecified symptoms and signs involving the nervous system: Secondary | ICD-10-CM | POA: Diagnosis not present

## 2023-09-29 DIAGNOSIS — R2681 Unsteadiness on feet: Secondary | ICD-10-CM | POA: Diagnosis not present

## 2023-09-29 DIAGNOSIS — Z466 Encounter for fitting and adjustment of urinary device: Secondary | ICD-10-CM | POA: Diagnosis not present

## 2023-10-22 DIAGNOSIS — R2689 Other abnormalities of gait and mobility: Secondary | ICD-10-CM | POA: Diagnosis not present

## 2023-10-22 DIAGNOSIS — M6281 Muscle weakness (generalized): Secondary | ICD-10-CM | POA: Diagnosis not present

## 2023-10-23 DIAGNOSIS — M6281 Muscle weakness (generalized): Secondary | ICD-10-CM | POA: Diagnosis not present

## 2023-10-23 DIAGNOSIS — R2689 Other abnormalities of gait and mobility: Secondary | ICD-10-CM | POA: Diagnosis not present

## 2023-10-25 DIAGNOSIS — R2689 Other abnormalities of gait and mobility: Secondary | ICD-10-CM | POA: Diagnosis not present

## 2023-10-25 DIAGNOSIS — M6281 Muscle weakness (generalized): Secondary | ICD-10-CM | POA: Diagnosis not present

## 2023-10-27 DIAGNOSIS — M6281 Muscle weakness (generalized): Secondary | ICD-10-CM | POA: Diagnosis not present

## 2023-10-27 DIAGNOSIS — R2689 Other abnormalities of gait and mobility: Secondary | ICD-10-CM | POA: Diagnosis not present

## 2023-10-30 ENCOUNTER — Telehealth: Payer: Self-pay | Admitting: Radiology

## 2023-10-30 NOTE — Telephone Encounter (Signed)
Ok for verbals 

## 2023-10-30 NOTE — Telephone Encounter (Signed)
Copied from CRM [id number]. Topic: Clinical - Home Health Verbal Orders >> Oct 30, 2023  9:02 AM Drema Balzarine wrote: Caller/Agency:Tonya from [facility] Callback Number: [phone] Service Requested: Nursing and Nurse aid verbal orders Frequency: 1 Week 4 nursing - nurse aid 1 week 3  Any new concerns about the patient? Yes, she is wheezing and legs are a 3-4 plus adema

## 2023-10-30 NOTE — Telephone Encounter (Signed)
LDVM for Katelyn Jackson with verbal ok for Nursing and Nurse aid verbal orders Frequency: 1 Week 4 nursing - nurse aid 1 week 3 Any new concerns about the patient? Yes, she is wheezing and legs are a 3-4 plus adema

## 2023-10-31 ENCOUNTER — Telehealth: Payer: Self-pay

## 2023-10-31 NOTE — Telephone Encounter (Signed)
Copied from CRM [id number]. Topic: Clinical - Medical Advice >> Oct 31, 2023  9:46 AM Fredrich Romans wrote:  Reason for CRM: Katelyn Jackson from [facility] called to verify the weight bearing status for patients left shoulder and left knee,he sated that patient is in a lot of pain. Cb#:[phone]

## 2023-10-31 NOTE — Telephone Encounter (Signed)
Do I need to see her virtually?  Thank you

## 2023-10-31 NOTE — Telephone Encounter (Signed)
Yes, if possible please see pt to discuss.

## 2023-11-02 ENCOUNTER — Ambulatory Visit (INDEPENDENT_AMBULATORY_CARE_PROVIDER_SITE_OTHER): Payer: Medicare HMO | Admitting: Internal Medicine

## 2023-11-02 ENCOUNTER — Encounter: Payer: Self-pay | Admitting: Internal Medicine

## 2023-11-02 ENCOUNTER — Telehealth: Payer: Self-pay

## 2023-11-02 VITALS — BP 110/70 | HR 62 | Temp 98.3°F | Ht 61.0 in | Wt 176.2 lb

## 2023-11-02 DIAGNOSIS — Z22338 Carrier of other streptococcus: Secondary | ICD-10-CM

## 2023-11-02 DIAGNOSIS — Z789 Other specified health status: Secondary | ICD-10-CM

## 2023-11-02 DIAGNOSIS — I10 Essential (primary) hypertension: Secondary | ICD-10-CM | POA: Diagnosis not present

## 2023-11-02 DIAGNOSIS — K7460 Unspecified cirrhosis of liver: Secondary | ICD-10-CM

## 2023-11-02 DIAGNOSIS — M5442 Lumbago with sciatica, left side: Secondary | ICD-10-CM | POA: Diagnosis not present

## 2023-11-02 DIAGNOSIS — N184 Chronic kidney disease, stage 4 (severe): Secondary | ICD-10-CM

## 2023-11-02 DIAGNOSIS — G8929 Other chronic pain: Secondary | ICD-10-CM

## 2023-11-02 DIAGNOSIS — E785 Hyperlipidemia, unspecified: Secondary | ICD-10-CM

## 2023-11-02 DIAGNOSIS — I482 Chronic atrial fibrillation, unspecified: Secondary | ICD-10-CM

## 2023-11-02 DIAGNOSIS — R739 Hyperglycemia, unspecified: Secondary | ICD-10-CM

## 2023-11-02 DIAGNOSIS — Z6841 Body Mass Index (BMI) 40.0 and over, adult: Secondary | ICD-10-CM

## 2023-11-02 LAB — CBC WITH DIFFERENTIAL/PLATELET
Basophils Absolute: 0 10*3/uL (ref 0.0–0.1)
Basophils Relative: 1.1 % (ref 0.0–3.0)
Eosinophils Absolute: 0.2 10*3/uL (ref 0.0–0.7)
Eosinophils Relative: 4.9 % (ref 0.0–5.0)
HCT: 22.8 % — CL (ref 36.0–46.0)
Hemoglobin: 7 g/dL — CL (ref 12.0–15.0)
Lymphocytes Relative: 21.1 % (ref 12.0–46.0)
Lymphs Abs: 0.8 10*3/uL (ref 0.7–4.0)
MCHC: 30.9 g/dL (ref 30.0–36.0)
MCV: 79.9 fL (ref 78.0–100.0)
Monocytes Absolute: 0.7 10*3/uL (ref 0.1–1.0)
Monocytes Relative: 20.3 % — ABNORMAL HIGH (ref 3.0–12.0)
Neutro Abs: 1.9 10*3/uL (ref 1.4–7.7)
Neutrophils Relative %: 52.6 % (ref 43.0–77.0)
Platelets: 187 10*3/uL (ref 150.0–400.0)
RBC: 2.85 Mil/uL — ABNORMAL LOW (ref 3.87–5.11)
RDW: 17.7 % — ABNORMAL HIGH (ref 11.5–15.5)
WBC: 3.7 10*3/uL — ABNORMAL LOW (ref 4.0–10.5)

## 2023-11-02 LAB — COMPREHENSIVE METABOLIC PANEL
ALT: 7 U/L (ref 0–35)
AST: 15 U/L (ref 0–37)
Albumin: 3.8 g/dL (ref 3.5–5.2)
Alkaline Phosphatase: 132 U/L — ABNORMAL HIGH (ref 39–117)
BUN: 33 mg/dL — ABNORMAL HIGH (ref 6–23)
CO2: 29 meq/L (ref 19–32)
Calcium: 9.7 mg/dL (ref 8.4–10.5)
Chloride: 101 meq/L (ref 96–112)
Creatinine, Ser: 1.72 mg/dL — ABNORMAL HIGH (ref 0.40–1.20)
GFR: 28.27 mL/min — ABNORMAL LOW (ref >60.00)
Glucose, Bld: 104 mg/dL — ABNORMAL HIGH (ref 70–99)
Potassium: 3.9 meq/L (ref 3.5–5.1)
Sodium: 137 meq/L (ref 135–145)
Total Bilirubin: 0.6 mg/dL (ref 0.2–1.2)
Total Protein: 7 g/dL (ref 6.0–8.3)

## 2023-11-02 LAB — T4, FREE: Free T4: 1.12 ng/dL (ref 0.60–1.60)

## 2023-11-02 LAB — HEMOGLOBIN A1C: Hgb A1c MFr Bld: 5.5 % (ref 4.6–6.5)

## 2023-11-02 LAB — VITAMIN B12: Vitamin B-12: 873 pg/mL (ref 211–911)

## 2023-11-02 LAB — URIC ACID: Uric Acid, Serum: 8.1 mg/dL — ABNORMAL HIGH (ref 2.4–7.0)

## 2023-11-02 LAB — SEDIMENTATION RATE: Sed Rate: 22 mm/h (ref 0–30)

## 2023-11-02 LAB — TSH: TSH: 1.75 u[IU]/mL (ref 0.35–5.50)

## 2023-11-02 LAB — CK: Total CK: 110 U/L (ref 7–177)

## 2023-11-02 LAB — VITAMIN D 25 HYDROXY (VIT D DEFICIENCY, FRACTURES): VITD: 58.9 ng/mL (ref 30.00–100.00)

## 2023-11-02 MED ORDER — OMEPRAZOLE 40 MG PO CPDR: 40.0000 mg | DELAYED_RELEASE_CAPSULE | Freq: Every day | ORAL | 3 refills | Status: DC

## 2023-11-02 MED ORDER — RIFAXIMIN 550 MG PO TABS: 550.0000 mg | ORAL_TABLET | Freq: Two times a day (BID) | ORAL | 11 refills | Status: DC

## 2023-11-02 MED ORDER — LACTULOSE 10 GM/15ML PO SOLN: ORAL | 3 refills | Status: DC

## 2023-11-02 MED ORDER — TRIAMCINOLONE ACETONIDE 0.1 % EX CREA: 1.0000 | TOPICAL_CREAM | Freq: Two times a day (BID) | CUTANEOUS | 3 refills | Status: AC

## 2023-11-02 MED ORDER — TORSEMIDE 20 MG PO TABS: 60.0000 mg | ORAL_TABLET | Freq: Every day | ORAL | 1 refills | Status: DC

## 2023-11-02 MED ORDER — POLYETHYLENE GLYCOL 3350 17 G PO PACK: 17.0000 g | PACK | Freq: Two times a day (BID) | ORAL | 1 refills | Status: DC

## 2023-11-02 MED ORDER — APIXABAN 5 MG PO TABS: 5.0000 mg | ORAL_TABLET | Freq: Two times a day (BID) | ORAL | 3 refills | Status: AC

## 2023-11-02 MED ORDER — LOVASTATIN 20 MG PO TABS: 20.0000 mg | ORAL_TABLET | Freq: Every day | ORAL | 3 refills | Status: DC

## 2023-11-02 MED ORDER — LEVOTHYROXINE SODIUM 50 MCG PO TABS: 50.0000 ug | ORAL_TABLET | Freq: Every day | ORAL | 3 refills | Status: DC

## 2023-11-02 MED ORDER — MAGNESIUM OXIDE 400 MG PO TABS: 400.0000 mg | ORAL_TABLET | Freq: Two times a day (BID) | ORAL | 3 refills | Status: AC

## 2023-11-02 MED ORDER — TRAMADOL HCL 50 MG PO TABS: 50.0000 mg | ORAL_TABLET | Freq: Four times a day (QID) | ORAL | 3 refills | Status: DC | PRN

## 2023-11-02 MED ORDER — ALLOPURINOL 100 MG PO TABS: 50.0000 mg | ORAL_TABLET | Freq: Every day | ORAL | 1 refills | Status: DC

## 2023-11-02 MED ORDER — SENNA-DOCUSATE SODIUM 8.6-50 MG PO TABS: 2.0000 | ORAL_TABLET | Freq: Two times a day (BID) | ORAL | 3 refills | Status: DC

## 2023-11-02 MED ORDER — CYANOCOBALAMIN 1000 MCG/ML IJ SOLN: 1000.0000 ug | INTRAMUSCULAR | 3 refills | Status: DC

## 2023-11-02 MED ORDER — POTASSIUM CHLORIDE ER 8 MEQ PO TBCR: 8.0000 meq | EXTENDED_RELEASE_TABLET | Freq: Every day | ORAL | 3 refills | Status: DC

## 2023-11-02 MED ORDER — CARVEDILOL 12.5 MG PO TABS: 12.5000 mg | ORAL_TABLET | Freq: Two times a day (BID) | ORAL | 2 refills | Status: DC

## 2023-11-02 MED ORDER — SPIRONOLACTONE 25 MG PO TABS: 25.0000 mg | ORAL_TABLET | Freq: Every day | ORAL | 3 refills | Status: DC

## 2023-11-02 NOTE — Assessment & Plan Note (Signed)
Wt Readings from Last 3 Encounters:  08/28/23 201 lb 1 oz (91.2 kg)  06/09/23 202 lb 13.2 oz (92 kg)  05/18/23 202 lb 13.2 oz (92 kg)   Wt 176.2 Lbs

## 2023-11-02 NOTE — Assessment & Plan Note (Signed)
On Rifampin

## 2023-11-02 NOTE — Assessment & Plan Note (Signed)
Cont w/Xifaxan and [facility]

## 2023-11-02 NOTE — Assessment & Plan Note (Signed)
D/c Lovastatin, Zetia due to severe cramps

## 2023-11-02 NOTE — Assessment & Plan Note (Signed)
On Coreg, Torsemide

## 2023-11-02 NOTE — Progress Notes (Signed)
Subjective:  Patient ID: Katelyn Jackson, female    DOB: 30-Jul-1946  Age: 77 y.o. MRN: [id number]  CC: Medical Management of Chronic Issues (Pt is needing labs and med refills)   HPI Katelyn Jackson presents for edema, pains Edema was gone when she used Triamc cream on legs C/o anemia, leg cramps Pt stopped Tramadol Out of Rehab x 5 d  Per hx: "   Expand All Collapse All  Physician Discharge Summary  Katelyn Jackson WUJ:[id number] DOB: February 27, 1946 DOA: 08/20/2023   PCP: Tresa Garter, MD   Admit date: 08/20/2023 Discharge date: 08/28/2023   Admitted From: (Home) Disposition:  (SNF)   Recommendations for Outpatient Follow-up:  Please obtain BMP/CBC in one week Check B12 level 4 to 6 weeks Please monitor her volume status and adjust diuresis as needed,  as her diuretics has been significantly decreased at time of discharge from her home baseline, please see discussion below     Diet recommendation: Heart Healthy   Brief/Interim Summary:    10 Russian-speaking female with history of permanent A-fib on anticoagulation, status post pacemaker, decompensated liver cirrhosis, CKD stage 2 hypertension, hyperlipidemia, hypothyroidism, chronic lymphedema, history of breast cancer status post radiation therapy, recent trauma with fracture of left upper and lower extremities, wheelchair dependent who was brought from home after a level 2 trauma following ground-level fall.  She lives with her son.  Patient noted right-sided numbness involving her right face, arm and leg with left-sided weakness before fall.  Suspected stroke on admission but MRI of the brain and cervical spine did not show any acute findings.  She was admitted for further workup, workup significant for ESP L UTI, and iron deficiency anemia, please see discussion below.   Fall/ambulatory dysfunction: -Patient with multiple falls in the past, multiple hospitalization, rehab placement, main concern is right  side weakness, unsteady gait, mainly upon standing up. -Extensive workup, loading MRI brain, cervical MRI, MRI lumbar spine, CT right hip. Negative MRI of the brain, cervical spine did not show any acute findings but showed mild C5-6 spinal canal stenosis,moderate bilateral C3-4 and C4-5 neural foraminal stenosis.  Her skeletal survey did not show any acute findings.  Right CT head with no acute findings. - Recent hospitalization from  07/10 to 05/24/23 for mechanical fall with left patellar and left distal clavicle fracture.  Recently had surgery of the right shoulder.   -Workup negative for acute CVA, as well as no significant findings on workup for TIA, so it is unlikely as well, carotid Dopplers reassuring -Patient main issue is right side lower extremity weakness upon trying to stand and to ambulate, most likely in the setting of her severe arthritis in the right knee causing this issue, current management will be continue with PT-OT.   1 bottle of blood culture growing Corynebacterium, gram-positive rods,  - contaminant, but given she has a pacemaker, I did repeat blood cultures, which did remain negative through hospital stay.   B12 deficiency -B12 level is borderline at 327,  on IM supplements, please recheck level in 4 to 6 weeks as an outpatient.   AKI on CKD stage II: -  Baseline creatinine around 1.1.  Is on diuretics and was hypotensive, though her diuretics has been held during hospital stay, as well her Coreg, renal function has recovered, blood pressure has improved, Coreg was resumed, and her diuresis dose has been decreased,  -Home Dose of torsemide 100 mg p.o. twice daily, this has been decreased to 60 mg oral daily,  Aldactone was resumed at home dose of 25 mg oral daily.     Chronic hypotension:  -On midodrine therapy.    Anemia of chronic kidney disease Iron deficiency anemia Hemoccult positive stool -Overall hemoglobin has been trending down, heme occult positive stool,  GI input greatly appreciated, she went for EGD 10/20, which was significant for mild gastritis, recommendation for prolonged PPI therapy, okay to resume Eliquis today as discussed with GI , patient has declined colonoscopy. -Monitor CBC closely    ESBL UTI UTI.   -History on IV Rocephin, this has changed to IV meropenem given urine culture showing ESBL.  She finished antibiotic treatment during hospital stay, no further antibiotics at time of discharge.   History of A-fib on anticoagulation/pacemaker placement:  -Continue with Eliquis for anticoagulation, and Coreg for heart rate control    History of cirrhosis: On lactulose, rifaximin at home.  Continue at time of discharge, her home dose Aldactone and torsemide has been resumed, but lower the dose as she presented with hypotension and AKI.    Hyperlipidemia: On statin at home   Gout: On allopurinol. "          Outpatient Medications Prior to Visit  Medication Sig Dispense Refill   bisacodyl (DULCOLAX) 10 MG suppository Place 10 mg rectally as needed for moderate constipation.     Menthol-Zinc Oxide (CALMOSEPTINE) 0.44-20.6 % OINT Apply 1 application  topically in the morning and at bedtime.     Nystatin (GERHARDT'S BUTT CREAM) CREA Apply 1 Application topically 2 (two) times daily. 1 each 1   Olopatadine HCl 0.2 % SOLN Place 1 drop into the right eye in the morning and at bedtime.     protein supplement (PROSOURCE NO CARB) LIQD Take 30 mLs by mouth 2 (two) times daily.     allopurinol (ZYLOPRIM) 100 MG tablet Take 0.5 tablets (50 mg total) by mouth daily. 90 tablet 1   apixaban (ELIQUIS) 5 MG TABS tablet Take 1 tablet (5 mg total) by mouth 2 (two) times daily. 180 tablet 3   carvedilol (COREG) 12.5 MG tablet Take 12.5 mg by mouth 2 (two) times daily with a meal.     cyanocobalamin (VITAMIN B12) 1000 MCG/ML injection Inject 1 mL (1,000 mcg total) into the skin every 30 (thirty) days.     ezetimibe (ZETIA) 10 MG tablet Take 1 tablet (10  mg total) by mouth daily.     lactulose (CHRONULAC) 10 GM/15ML solution TAKE 15 MILLILITERS BY MOUTH THREE TIMESDAILY (Patient taking differently: Take 10 g by mouth 3 (three) times daily.) 946 mL 3   levothyroxine (SYNTHROID) 50 MCG tablet Take 1 tablet (50 mcg total) by mouth daily before breakfast. Overdue for Annual appt must see provider for future refills 90 tablet 3   lovastatin (MEVACOR) 20 MG tablet Take 20 mg by mouth at bedtime.     magnesium oxide (MAG-OX) 400 MG tablet Take 1 tablet (400 mg total) by mouth 2 (two) times daily. 180 tablet 3   omeprazole (PRILOSEC) 40 MG capsule TAKE ONE CAPSULE BY MOUTH DAILY (Patient taking differently: Take 40 mg by mouth daily before breakfast.) 90 capsule 3   polyethylene glycol (MIRALAX / GLYCOLAX) 17 g packet Take 17 g by mouth 2 (two) times daily. 60 each 1   potassium chloride (KLOR-CON) 8 MEQ tablet TAKE ONE (1) TABLET BY MOUTH EVERY DAY 90 tablet 3   rifaximin (XIFAXAN) 550 MG TABS tablet Take 1 tablet (550 mg total) by mouth 2 (two) times  daily. 60 tablet 11   sennosides-docusate sodium (SENOKOT-S) 8.6-50 MG tablet Take 2 tablets by mouth every 12 (twelve) hours.     spironolactone (ALDACTONE) 25 MG tablet Take 25 mg by mouth daily.     torsemide (DEMADEX) 20 MG tablet Take 3 tablets (60 mg total) by mouth daily.     traMADol (ULTRAM) 50 MG tablet Take by mouth every 6 (six) hours as needed.     triamcinolone cream (KENALOG) 0.1 % Apply topically 2 (two) times daily. (Patient taking differently: Apply 1 Application topically 2 (two) times daily. To dry patches on BLE and bilateral feet) 450 g 3   No facility-administered medications prior to visit.    ROS: Review of Systems  Constitutional:  Negative for activity change, appetite change, chills, diaphoresis, fatigue, fever and unexpected weight change.  HENT:  Negative for congestion, dental problem, ear pain, hearing loss, mouth sores, postnasal drip, sinus pressure, sneezing, sore throat  and voice change.   Eyes:  Negative for pain and visual disturbance.  Respiratory:  Negative for cough, chest tightness, wheezing and stridor.   Cardiovascular:  Negative for chest pain, palpitations and leg swelling.  Gastrointestinal:  Negative for abdominal distention, abdominal pain, blood in stool, nausea, rectal pain and vomiting.  Genitourinary:  Negative for decreased urine volume, difficulty urinating, dysuria, frequency, hematuria, menstrual problem, vaginal bleeding, vaginal discharge and vaginal pain.  Musculoskeletal:  Positive for arthralgias, back pain and gait problem. Negative for joint swelling and neck pain.  Skin:  Negative for color change, rash and wound.  Neurological:  Negative for dizziness, tremors, syncope, speech difficulty, weakness and light-headedness.  Hematological:  Negative for adenopathy.  Psychiatric/Behavioral:  Positive for decreased concentration. Negative for behavioral problems, confusion, dysphoric mood, hallucinations, sleep disturbance and suicidal ideas. The patient is not nervous/anxious and is not hyperactive.     Objective:  BP 110/70 (BP Location: Right Arm, Patient Position: Sitting, Cuff Size: Normal)   Pulse 62   Temp 98.3 F (36.8 C) (Oral)   Ht 5\' 1"  (1.549 m)   Wt 176 lb 3.2 oz (79.9 kg)   LMP  (LMP Unknown)   SpO2 98%   BMI 33.29 kg/m   BP Readings from Last 3 Encounters:  11/02/23 110/70  08/28/23 125/64  06/15/23 125/60    Wt Readings from Last 3 Encounters:  11/02/23 176 lb 3.2 oz (79.9 kg)  08/28/23 201 lb 1 oz (91.2 kg)  06/09/23 202 lb 13.2 oz (92 kg)    Physical Exam Constitutional:      General: She is not in acute distress.    Appearance: She is well-developed. She is obese.  HENT:     Head: Normocephalic.     Right Ear: External ear normal.     Left Ear: External ear normal.     Nose: Nose normal.  Eyes:     General:        Right eye: No discharge.        Left eye: No discharge.      Conjunctiva/sclera: Conjunctivae normal.     Pupils: Pupils are equal, round, and reactive to light.  Neck:     Thyroid: No thyromegaly.     Vascular: No JVD.     Trachea: No tracheal deviation.  Cardiovascular:     Rate and Rhythm: Normal rate and regular rhythm.     Heart sounds: Normal heart sounds.  Pulmonary:     Effort: No respiratory distress.     Breath sounds: No  stridor. No wheezing.  Abdominal:     General: Bowel sounds are normal. There is no distension.     Palpations: Abdomen is soft. There is no mass.     Tenderness: There is no abdominal tenderness. There is no guarding or rebound.  Musculoskeletal:        General: No tenderness.     Cervical back: Normal range of motion and neck supple. No rigidity.     Right lower leg: Edema present.     Left lower leg: Edema present.  Lymphadenopathy:     Cervical: No cervical adenopathy.  Skin:    Findings: No erythema or rash.  Neurological:     Cranial Nerves: No cranial nerve deficit.     Motor: Weakness present. No abnormal muscle tone.     Coordination: Coordination normal.     Gait: Gait abnormal.     Deep Tendon Reflexes: Reflexes normal.  Psychiatric:        Behavior: Behavior normal.        Thought Content: Thought content normal.        Judgment: Judgment normal.    3+ ankle edema LS, shoulders, hips  w/pain Lab Results  Component Value Date   WBC 4.9 08/28/2023   HGB 8.5 (L) 08/28/2023   HCT 27.6 (L) 08/28/2023   PLT 150 08/28/2023   GLUCOSE 91 08/28/2023   CHOL 151 08/21/2023   TRIG 67 08/21/2023   HDL 63 08/21/2023   LDLCALC 75 08/21/2023   ALT 12 08/20/2023   AST 17 08/20/2023   NA 139 08/28/2023   K 3.9 08/28/2023   CL 109 08/28/2023   CREATININE 1.23 (H) 08/28/2023   BUN 12 08/28/2023   CO2 23 08/28/2023   TSH 1.598 08/23/2023   INR 1.4 (H) 08/20/2023   HGBA1C 4.8 08/21/2023    MR Cervical Spine Wo Contrast Result Date: 08/21/2023 CLINICAL DATA:  Trauma EXAM: MRI CERVICAL SPINE  WITHOUT CONTRAST TECHNIQUE: Multiplanar, multisequence MR imaging of the cervical spine was performed. No intravenous contrast was administered. COMPARISON:  None Available. FINDINGS: Alignment: Grade 1 anterolisthesis at C2-3 and grade 1 retrolisthesis at C4-5. Vertebrae: No fracture, evidence of discitis, or bone lesion. Cord: Normal signal and morphology. Posterior Fossa, vertebral arteries, paraspinal tissues: Negative. Disc levels: C1-2: Unremarkable. C2-3: Normal disc space and facet joints. There is no spinal canal stenosis. No neural foraminal stenosis. C3-4: Small disc bulge with bilateral uncovertebral hypertrophy. There is no spinal canal stenosis. Moderate bilateral neural foraminal stenosis. C4-5: Small disc bulge and bilateral uncovertebral hypertrophy. There is no spinal canal stenosis. Moderate bilateral neural foraminal stenosis. C5-6: Small disc bulge with mild uncovertebral hypertrophy. Mild spinal canal stenosis. Mild bilateral neural foraminal stenosis. C6-7: Small disc bulge with uncovertebral hypertrophy. There is no spinal canal stenosis. No neural foraminal stenosis. C7-T1: Normal disc space and facet joints. There is no spinal canal stenosis. No neural foraminal stenosis. IMPRESSION: 1. No acute abnormality of the cervical spine. 2. Mild C5-6 spinal canal stenosis. 3. Moderate bilateral C3-4 and C4-5 neural foraminal stenosis. Electronically Signed   By: Deatra Robinson M.D.   On: 08/21/2023 02:55   MR BRAIN WO CONTRAST Result Date: 08/21/2023 CLINICAL DATA:  Acute neurologic deficits EXAM: MRI HEAD WITHOUT CONTRAST TECHNIQUE: Multiplanar, multiecho pulse sequences of the brain and surrounding structures were obtained without intravenous contrast. COMPARISON:  None Available. FINDINGS: Brain: No acute infarct, mass effect or extra-axial collection. No acute or chronic hemorrhage. There is multifocal hyperintense T2-weighted signal within the white  matter. Parenchymal volume and CSF spaces  are normal. The midline structures are normal. Vascular: Normal flow voids. Skull and upper cervical spine: Grade 1 anterolisthesis at C2-3. Normal calvarium. Sinuses/Orbits:No paranasal sinus fluid levels or advanced mucosal thickening. No mastoid or middle ear effusion. Normal orbits. Ocular lens replacements. IMPRESSION: 1. No acute intracranial abnormality. 2. Findings of chronic small vessel ischemia. Electronically Signed   By: Deatra Robinson M.D.   On: 08/21/2023 02:30   CT Head Wo Contrast Result Date: 08/20/2023 CLINICAL DATA:  Trauma EXAM: CT HEAD WITHOUT CONTRAST CT CERVICAL SPINE WITHOUT CONTRAST TECHNIQUE: Multidetector CT imaging of the head and cervical spine was performed following the standard protocol without intravenous contrast. Multiplanar CT image reconstructions of the cervical spine were also generated. RADIATION DOSE REDUCTION: This exam was performed according to the departmental dose-optimization program which includes automated exposure control, adjustment of the mA and/or kV according to patient size and/or use of iterative reconstruction technique. COMPARISON:  None Available. FINDINGS: CT HEAD FINDINGS Brain: There is no mass, hemorrhage or extra-axial collection. The size and configuration of the ventricles and extra-axial CSF spaces are normal. There is hypoattenuation of the periventricular white matter, most commonly indicating chronic ischemic microangiopathy. Vascular: No abnormal hyperdensity of the major intracranial arteries or dural venous sinuses. No intracranial atherosclerosis. Skull: The visualized skull base, calvarium and extracranial soft tissues are normal. Sinuses/Orbits: No fluid levels or advanced mucosal thickening of the visualized paranasal sinuses. No mastoid or middle ear effusion. The orbits are normal. CT CERVICAL SPINE FINDINGS Alignment: Anterolisthesis at C2-3.  Grade 1 retrolisthesis at C4-5. Skull base and vertebrae: No acute fracture. Soft tissues  and spinal canal: No prevertebral fluid or swelling. No visible canal hematoma. Disc levels: Multilevel degenerative disc disease. No high-grade spinal canal stenosis. Multilevel neural foraminal narrowing, greatest at C4-5. Upper chest: No pneumothorax, pulmonary nodule or pleural effusion. Other: Calcific atherosclerosis of the left greater than right carotid system. IMPRESSION: 1. No acute intracranial abnormality. 2. No acute fracture or static subluxation of the cervical spine. 3. Multilevel degenerative disc disease. Electronically Signed   By: Deatra Robinson M.D.   On: 08/20/2023 22:01   CT Cervical Spine Wo Contrast Result Date: 08/20/2023 CLINICAL DATA:  Trauma EXAM: CT HEAD WITHOUT CONTRAST CT CERVICAL SPINE WITHOUT CONTRAST TECHNIQUE: Multidetector CT imaging of the head and cervical spine was performed following the standard protocol without intravenous contrast. Multiplanar CT image reconstructions of the cervical spine were also generated. RADIATION DOSE REDUCTION: This exam was performed according to the departmental dose-optimization program which includes automated exposure control, adjustment of the mA and/or kV according to patient size and/or use of iterative reconstruction technique. COMPARISON:  None Available. FINDINGS: CT HEAD FINDINGS Brain: There is no mass, hemorrhage or extra-axial collection. The size and configuration of the ventricles and extra-axial CSF spaces are normal. There is hypoattenuation of the periventricular white matter, most commonly indicating chronic ischemic microangiopathy. Vascular: No abnormal hyperdensity of the major intracranial arteries or dural venous sinuses. No intracranial atherosclerosis. Skull: The visualized skull base, calvarium and extracranial soft tissues are normal. Sinuses/Orbits: No fluid levels or advanced mucosal thickening of the visualized paranasal sinuses. No mastoid or middle ear effusion. The orbits are normal. CT CERVICAL SPINE FINDINGS  Alignment: Anterolisthesis at C2-3.  Grade 1 retrolisthesis at C4-5. Skull base and vertebrae: No acute fracture. Soft tissues and spinal canal: No prevertebral fluid or swelling. No visible canal hematoma. Disc levels: Multilevel degenerative disc disease. No high-grade spinal canal stenosis. Multilevel  neural foraminal narrowing, greatest at C4-5. Upper chest: No pneumothorax, pulmonary nodule or pleural effusion. Other: Calcific atherosclerosis of the left greater than right carotid system. IMPRESSION: 1. No acute intracranial abnormality. 2. No acute fracture or static subluxation of the cervical spine. 3. Multilevel degenerative disc disease. Electronically Signed   By: Deatra Robinson M.D.   On: 08/20/2023 22:01   DG Pelvis Portable Result Date: 08/20/2023 CLINICAL DATA:  Fall EXAM: PORTABLE PELVIS 1-2 VIEWS COMPARISON:  None Available. FINDINGS: There is no evidence of displaced pelvic fracture or diastasis. No pelvic bone lesions are seen. IMPRESSION: No displaced pelvic fracture or dislocation. Electronically Signed   By: Jearld Lesch M.D.   On: 08/20/2023 21:43   DG Shoulder Left Portable Result Date: 08/20/2023 CLINICAL DATA:  Questionable sepsis EXAM: LEFT SHOULDER COMPARISON:  05/23/2023 FINDINGS: No fracture or dislocation of the left shoulder. Mild acromioclavicular and glenohumeral arthrosis with high riding position of the humerus, suggesting chronic rotator cuff tear. IMPRESSION: 1. No fracture or dislocation of the left shoulder. 2. Mild acromioclavicular and glenohumeral arthrosis with high riding position of the humerus, suggesting chronic rotator cuff tear. Electronically Signed   By: Jearld Lesch M.D.   On: 08/20/2023 21:36   DG Knee Complete 4 Views Right Result Date: 08/20/2023 CLINICAL DATA:  Questionable sepsis EXAM: RIGHT KNEE - COMPLETE 4+ VIEW; LEFT KNEE - COMPLETE 4+ VIEW COMPARISON:  None Available. FINDINGS: No fracture or dislocation of the bilateral knees. Severe,  bone-on-bone tricompartmental arthrosis, worst in the patellofemoral compartments. No knee joint effusions. Soft tissues unremarkable. IMPRESSION: 1. No fracture or dislocation of the bilateral knees. 2. Severe, bone-on-bone tricompartmental arthrosis, worst in the patellofemoral compartments. 3. No knee joint effusions. Electronically Signed   By: Jearld Lesch M.D.   On: 08/20/2023 21:34   DG Knee Complete 4 Views Left Result Date: 08/20/2023 CLINICAL DATA:  Questionable sepsis EXAM: RIGHT KNEE - COMPLETE 4+ VIEW; LEFT KNEE - COMPLETE 4+ VIEW COMPARISON:  None Available. FINDINGS: No fracture or dislocation of the bilateral knees. Severe, bone-on-bone tricompartmental arthrosis, worst in the patellofemoral compartments. No knee joint effusions. Soft tissues unremarkable. IMPRESSION: 1. No fracture or dislocation of the bilateral knees. 2. Severe, bone-on-bone tricompartmental arthrosis, worst in the patellofemoral compartments. 3. No knee joint effusions. Electronically Signed   By: Jearld Lesch M.D.   On: 08/20/2023 21:34   DG Chest Port 1 View Result Date: 08/20/2023 CLINICAL DATA:  Questionable sepsis EXAM: PORTABLE CHEST 1 VIEW COMPARISON:  05/17/2023 FINDINGS: Gross cardiomegaly. Left chest multi lead pacer. Mild diffuse interstitial opacity. No acute osseous findings. Unchanged, chronic, resorbed fractures of the proximal right humerus. IMPRESSION: Gross cardiomegaly with mild diffuse interstitial opacity, consistent with edema or atypical/viral infection. No focal airspace opacity. Electronically Signed   By: Jearld Lesch M.D.   On: 08/20/2023 21:30    Assessment & Plan:   Problem List Items Addressed This Visit     Essential hypertension - Primary (Chronic)   On Coreg, Torsemide      Relevant Medications   apixaban (ELIQUIS) 5 MG TABS tablet   carvedilol (COREG) 12.5 MG tablet   spironolactone (ALDACTONE) 25 MG tablet   torsemide (DEMADEX) 20 MG tablet   Other Relevant Orders    CBC with Differential/Platelet   Comprehensive metabolic panel   Iron, TIBC and Ferritin Panel   T4, free   TSH   Sedimentation rate   Urinalysis   Uric acid   Vitamin B12   VITAMIN D 25 Hydroxy (Vit-D  Deficiency, Fractures)   Hemoglobin A1c   Chronic atrial fibrillation (HCC) (Chronic)   Continue with Coumadin      Relevant Medications   apixaban (ELIQUIS) 5 MG TABS tablet   carvedilol (COREG) 12.5 MG tablet   spironolactone (ALDACTONE) 25 MG tablet   torsemide (DEMADEX) 20 MG tablet   Other Relevant Orders   CBC with Differential/Platelet   Comprehensive metabolic panel   Iron, TIBC and Ferritin Panel   T4, free   TSH   Sedimentation rate   Urinalysis   Uric acid   Vitamin B12   VITAMIN D 25 Hydroxy (Vit-D Deficiency, Fractures)   Hemoglobin A1c   Dyslipidemia   D/c Lovastatin, Zetia due to severe cramps      Relevant Orders   CBC with Differential/Platelet   Comprehensive metabolic panel   Iron, TIBC and Ferritin Panel   T4, free   TSH   Sedimentation rate   Urinalysis   Uric acid   Vitamin B12   VITAMIN D 25 Hydroxy (Vit-D Deficiency, Fractures)   Hemoglobin A1c   Low back pain   Tramadol prn  Potential benefits of a long term opioids use as well as potential risks (i.e. addiction risk, apnea etc) and complications (i.e. Somnolence, constipation and others) were explained to the patient and were aknowledged.      Relevant Medications   traMADol (ULTRAM) 50 MG tablet   Other Relevant Orders   CK   Hyperglycemia   Labs      Liver cirrhosis (HCC)   Cont w/Xifaxan and Laculose      Colonization with VRE (vancomycin-resistant enterococcus)   On Rifampin      Relevant Medications   rifaximin (XIFAXAN) 550 MG TABS tablet   CKD (chronic kidney disease) stage 4, GFR 15-29 ml/min (HCC)   Continue to hydrate well      Morbid obesity with BMI of 45.0-49.9, adult (HCC)   Wt Readings from Last 3 Encounters:  08/28/23 201 lb 1 oz (91.2 kg)  06/09/23  202 lb 13.2 oz (92 kg)  05/18/23 202 lb 13.2 oz (92 kg)   Wt 176.2 Lbs       Relevant Medications   magnesium oxide (MAG-OX) 400 MG tablet   Other Relevant Orders   CBC with Differential/Platelet   Comprehensive metabolic panel   Iron, TIBC and Ferritin Panel   T4, free   TSH   Sedimentation rate   Urinalysis   Uric acid   Vitamin B12   VITAMIN D 25 Hydroxy (Vit-D Deficiency, Fractures)   Hemoglobin A1c   Statin intolerance   D/c Lovastatin, Zetia due to severe cramps         Meds ordered this encounter  Medications   allopurinol (ZYLOPRIM) 100 MG tablet    Sig: Take 0.5 tablets (50 mg total) by mouth daily.    Dispense:  90 tablet    Refill:  1   apixaban (ELIQUIS) 5 MG TABS tablet    Sig: Take 1 tablet (5 mg total) by mouth 2 (two) times daily.    Dispense:  180 tablet    Refill:  3   carvedilol (COREG) 12.5 MG tablet    Sig: Take 1 tablet (12.5 mg total) by mouth 2 (two) times daily with a meal.    Dispense:  90 tablet    Refill:  2   cyanocobalamin (VITAMIN B12) 1000 MCG/ML injection    Sig: Inject 1 mL (1,000 mcg total) into the skin every 30 (thirty) days.  Dispense:  3 mL    Refill:  3   lactulose (CHRONULAC) 10 GM/15ML solution    Sig: TAKE 15 MILLILITERS BY MOUTH THREE TIMESDAILY    Dispense:  946 mL    Refill:  3   levothyroxine (SYNTHROID) 50 MCG tablet    Sig: Take 1 tablet (50 mcg total) by mouth daily before breakfast. Overdue for Annual appt must see provider for future refills    Dispense:  90 tablet    Refill:  3   DISCONTD: lovastatin (MEVACOR) 20 MG tablet    Sig: Take 1 tablet (20 mg total) by mouth at bedtime.    Dispense:  90 tablet    Refill:  3   magnesium oxide (MAG-OX) 400 MG tablet    Sig: Take 1 tablet (400 mg total) by mouth 2 (two) times daily.    Dispense:  180 tablet    Refill:  3   omeprazole (PRILOSEC) 40 MG capsule    Sig: Take 1 capsule (40 mg total) by mouth daily.    Dispense:  90 capsule    Refill:  3    potassium chloride (KLOR-CON) 8 MEQ tablet    Sig: Take 1 tablet (8 mEq total) by mouth daily.    Dispense:  90 tablet    Refill:  3   rifaximin (XIFAXAN) 550 MG TABS tablet    Sig: Take 1 tablet (550 mg total) by mouth 2 (two) times daily.    Dispense:  60 tablet    Refill:  11   polyethylene glycol (MIRALAX / GLYCOLAX) 17 g packet    Sig: Take 17 g by mouth 2 (two) times daily.    Dispense:  60 each    Refill:  1   sennosides-docusate sodium (SENOKOT-S) 8.6-50 MG tablet    Sig: Take 2 tablets by mouth every 12 (twelve) hours.    Dispense:  120 tablet    Refill:  3   spironolactone (ALDACTONE) 25 MG tablet    Sig: Take 1 tablet (25 mg total) by mouth daily.    Dispense:  90 tablet    Refill:  3   torsemide (DEMADEX) 20 MG tablet    Sig: Take 3 tablets (60 mg total) by mouth daily.    Dispense:  270 tablet    Refill:  1   traMADol (ULTRAM) 50 MG tablet    Sig: Take 1 tablet (50 mg total) by mouth every 6 (six) hours as needed.    Dispense:  30 tablet    Refill:  3   triamcinolone cream (KENALOG) 0.1 %    Sig: Apply 1 Application topically 2 (two) times daily. To dry patches on BLE and bilateral feet    Dispense:  450 g    Refill:  3    Needs a jar      Follow-up: Return in about 6 weeks (around 12/14/2023) for a follow-up visit.  Sonda Primes, MD

## 2023-11-02 NOTE — Assessment & Plan Note (Signed)
Labs

## 2023-11-02 NOTE — Assessment & Plan Note (Signed)
Continue to hydrate well

## 2023-11-02 NOTE — Assessment & Plan Note (Signed)
Continue with Coumadin. 

## 2023-11-02 NOTE — Patient Instructions (Signed)
Stop Lovastatin, Zetia due to severe cramps

## 2023-11-02 NOTE — Telephone Encounter (Signed)
CRITICAL VALUE STICKER  CRITICAL VALUE: Hemoglobin 7.0, Hematocrit 22.8  RECEIVER (on-site recipient of call): Katelyn Jackson   DATE & TIME NOTIFIED: 11/02/2023 @ 11:36 am  MESSENGER (representative from lab): Clydie Braun  MD NOTIFIED: Yes  TIME OF NOTIFICATION: 11:40 am  RESPONSE:  Okay.

## 2023-11-02 NOTE — Assessment & Plan Note (Signed)
Tramadol prn ° Potential benefits of a long term opioids use as well as potential risks (i.e. addiction risk, apnea etc) and complications (i.e. Somnolence, constipation and others) were explained to the patient and were aknowledged. ° ° °

## 2023-11-03 LAB — IRON,TIBC AND FERRITIN PANEL
%SAT: 3 % — ABNORMAL LOW (ref 16–45)
Ferritin: 14 ng/mL — ABNORMAL LOW (ref 16–288)
Iron: 12 ug/dL — ABNORMAL LOW (ref 45–160)
TIBC: 452 ug/dL — ABNORMAL HIGH (ref 250–450)

## 2023-11-03 NOTE — Telephone Encounter (Signed)
Please call the patient's son Jeb Levering and inform him of his mother's low hemoglobin of 7.0.  She needs to receive another Feraheme infusion with Nephrology ASAP as before. Thank you

## 2023-11-03 NOTE — Telephone Encounter (Addendum)
Spoke with pts son and was able to inform pts son of Dr Plotnkiov's instructions. Pts son will reach out to the Nephrology office to set this up.

## 2023-11-03 NOTE — Telephone Encounter (Signed)
The patient was seen yesterday.  Thanks

## 2023-11-13 DIAGNOSIS — I4821 Permanent atrial fibrillation: Secondary | ICD-10-CM

## 2023-11-13 DIAGNOSIS — D509 Iron deficiency anemia, unspecified: Secondary | ICD-10-CM

## 2023-11-13 DIAGNOSIS — S42002D Fracture of unspecified part of left clavicle, subsequent encounter for fracture with routine healing: Secondary | ICD-10-CM | POA: Diagnosis not present

## 2023-11-13 DIAGNOSIS — I5032 Chronic diastolic (congestive) heart failure: Secondary | ICD-10-CM | POA: Diagnosis not present

## 2023-11-13 DIAGNOSIS — M19012 Primary osteoarthritis, left shoulder: Secondary | ICD-10-CM

## 2023-11-13 DIAGNOSIS — M5030 Other cervical disc degeneration, unspecified cervical region: Secondary | ICD-10-CM

## 2023-11-13 DIAGNOSIS — N179 Acute kidney failure, unspecified: Secondary | ICD-10-CM

## 2023-11-13 DIAGNOSIS — S82002D Unspecified fracture of left patella, subsequent encounter for closed fracture with routine healing: Secondary | ICD-10-CM | POA: Diagnosis not present

## 2023-11-13 DIAGNOSIS — M170 Bilateral primary osteoarthritis of knee: Secondary | ICD-10-CM

## 2023-11-13 DIAGNOSIS — N1832 Chronic kidney disease, stage 3b: Secondary | ICD-10-CM

## 2023-11-13 DIAGNOSIS — I130 Hypertensive heart and chronic kidney disease with heart failure and stage 1 through stage 4 chronic kidney disease, or unspecified chronic kidney disease: Secondary | ICD-10-CM | POA: Diagnosis not present

## 2023-11-13 DIAGNOSIS — D631 Anemia in chronic kidney disease: Secondary | ICD-10-CM

## 2023-11-15 ENCOUNTER — Other Ambulatory Visit (HOSPITAL_COMMUNITY): Payer: Self-pay | Admitting: *Deleted

## 2023-11-20 ENCOUNTER — Encounter (HOSPITAL_COMMUNITY)
Admission: RE | Admit: 2023-11-20 | Discharge: 2023-11-20 | Disposition: A | Discharge status: Home / Self Care | Payer: Medicare HMO | Source: Ambulatory Visit | Attending: Internal Medicine | Admitting: Internal Medicine

## 2023-11-20 DIAGNOSIS — D631 Anemia in chronic kidney disease: Secondary | ICD-10-CM | POA: Insufficient documentation

## 2023-11-20 DIAGNOSIS — N189 Chronic kidney disease, unspecified: Secondary | ICD-10-CM | POA: Insufficient documentation

## 2023-11-20 MED ORDER — IRON SUCROSE 200 MG IVPB - SIMPLE MED
200.0000 mg | Status: DC
  Administered 2023-11-20: 200 mg via INTRAVENOUS
  Filled 2023-11-20: qty 200

## 2023-11-24 ENCOUNTER — Encounter (HOSPITAL_COMMUNITY): Payer: Self-pay

## 2023-11-24 ENCOUNTER — Inpatient Hospital Stay (HOSPITAL_COMMUNITY)
Admission: EM | Admit: 2023-11-24 | Discharge: 2023-12-01 | DRG: 193 | Disposition: A | Discharge status: Skilled Nursing Facility | Payer: Medicare HMO | Attending: Internal Medicine | Admitting: Internal Medicine

## 2023-11-24 ENCOUNTER — Emergency Department (HOSPITAL_COMMUNITY): Payer: Medicare HMO

## 2023-11-24 ENCOUNTER — Other Ambulatory Visit: Payer: Self-pay

## 2023-11-24 DIAGNOSIS — M1711 Unilateral primary osteoarthritis, right knee: Secondary | ICD-10-CM | POA: Diagnosis present

## 2023-11-24 DIAGNOSIS — J208 Acute bronchitis due to other specified organisms: Secondary | ICD-10-CM | POA: Diagnosis present

## 2023-11-24 DIAGNOSIS — J121 Respiratory syncytial virus pneumonia: Principal | ICD-10-CM

## 2023-11-24 DIAGNOSIS — Z9981 Dependence on supplemental oxygen: Secondary | ICD-10-CM

## 2023-11-24 DIAGNOSIS — K219 Gastro-esophageal reflux disease without esophagitis: Secondary | ICD-10-CM | POA: Diagnosis present

## 2023-11-24 DIAGNOSIS — Z823 Family history of stroke: Secondary | ICD-10-CM

## 2023-11-24 DIAGNOSIS — C50412 Malignant neoplasm of upper-outer quadrant of left female breast: Secondary | ICD-10-CM

## 2023-11-24 DIAGNOSIS — K5900 Constipation, unspecified: Secondary | ICD-10-CM | POA: Diagnosis present

## 2023-11-24 DIAGNOSIS — D638 Anemia in other chronic diseases classified elsewhere: Secondary | ICD-10-CM | POA: Diagnosis present

## 2023-11-24 DIAGNOSIS — I890 Lymphedema, not elsewhere classified: Secondary | ICD-10-CM | POA: Diagnosis present

## 2023-11-24 DIAGNOSIS — R0600 Dyspnea, unspecified: Principal | ICD-10-CM

## 2023-11-24 DIAGNOSIS — E871 Hypo-osmolality and hyponatremia: Secondary | ICD-10-CM | POA: Diagnosis present

## 2023-11-24 DIAGNOSIS — I272 Pulmonary hypertension, unspecified: Secondary | ICD-10-CM | POA: Diagnosis present

## 2023-11-24 DIAGNOSIS — I482 Chronic atrial fibrillation, unspecified: Secondary | ICD-10-CM | POA: Diagnosis not present

## 2023-11-24 DIAGNOSIS — Z7901 Long term (current) use of anticoagulants: Secondary | ICD-10-CM

## 2023-11-24 DIAGNOSIS — J9601 Acute respiratory failure with hypoxia: Secondary | ICD-10-CM | POA: Diagnosis present

## 2023-11-24 DIAGNOSIS — D631 Anemia in chronic kidney disease: Secondary | ICD-10-CM | POA: Diagnosis present

## 2023-11-24 DIAGNOSIS — Z950 Presence of cardiac pacemaker: Secondary | ICD-10-CM | POA: Diagnosis not present

## 2023-11-24 DIAGNOSIS — Z1152 Encounter for screening for COVID-19: Secondary | ICD-10-CM

## 2023-11-24 DIAGNOSIS — N179 Acute kidney failure, unspecified: Secondary | ICD-10-CM | POA: Diagnosis present

## 2023-11-24 DIAGNOSIS — Z6835 Body mass index (BMI) 35.0-35.9, adult: Secondary | ICD-10-CM

## 2023-11-24 DIAGNOSIS — Z79890 Hormone replacement therapy (postmenopausal): Secondary | ICD-10-CM

## 2023-11-24 DIAGNOSIS — I5023 Acute on chronic systolic (congestive) heart failure: Secondary | ICD-10-CM

## 2023-11-24 DIAGNOSIS — M810 Age-related osteoporosis without current pathological fracture: Secondary | ICD-10-CM | POA: Diagnosis present

## 2023-11-24 DIAGNOSIS — J189 Pneumonia, unspecified organism: Secondary | ICD-10-CM

## 2023-11-24 DIAGNOSIS — K7460 Unspecified cirrhosis of liver: Secondary | ICD-10-CM | POA: Diagnosis present

## 2023-11-24 DIAGNOSIS — K7581 Nonalcoholic steatohepatitis (NASH): Secondary | ICD-10-CM | POA: Diagnosis present

## 2023-11-24 DIAGNOSIS — J9621 Acute and chronic respiratory failure with hypoxia: Secondary | ICD-10-CM | POA: Diagnosis present

## 2023-11-24 DIAGNOSIS — I10 Essential (primary) hypertension: Secondary | ICD-10-CM | POA: Diagnosis present

## 2023-11-24 DIAGNOSIS — Z170 Estrogen receptor positive status [ER+]: Secondary | ICD-10-CM

## 2023-11-24 DIAGNOSIS — E66812 Obesity, class 2: Secondary | ICD-10-CM | POA: Diagnosis present

## 2023-11-24 DIAGNOSIS — Z8249 Family history of ischemic heart disease and other diseases of the circulatory system: Secondary | ICD-10-CM

## 2023-11-24 DIAGNOSIS — I2781 Cor pulmonale (chronic): Secondary | ICD-10-CM | POA: Diagnosis present

## 2023-11-24 DIAGNOSIS — I130 Hypertensive heart and chronic kidney disease with heart failure and stage 1 through stage 4 chronic kidney disease, or unspecified chronic kidney disease: Secondary | ICD-10-CM | POA: Diagnosis present

## 2023-11-24 DIAGNOSIS — Z853 Personal history of malignant neoplasm of breast: Secondary | ICD-10-CM

## 2023-11-24 DIAGNOSIS — Z888 Allergy status to other drugs, medicaments and biological substances status: Secondary | ICD-10-CM

## 2023-11-24 DIAGNOSIS — D649 Anemia, unspecified: Secondary | ICD-10-CM

## 2023-11-24 DIAGNOSIS — E785 Hyperlipidemia, unspecified: Secondary | ICD-10-CM | POA: Diagnosis present

## 2023-11-24 DIAGNOSIS — Z79899 Other long term (current) drug therapy: Secondary | ICD-10-CM

## 2023-11-24 DIAGNOSIS — Z961 Presence of intraocular lens: Secondary | ICD-10-CM | POA: Diagnosis present

## 2023-11-24 DIAGNOSIS — Z825 Family history of asthma and other chronic lower respiratory diseases: Secondary | ICD-10-CM

## 2023-11-24 DIAGNOSIS — D509 Iron deficiency anemia, unspecified: Secondary | ICD-10-CM | POA: Diagnosis present

## 2023-11-24 DIAGNOSIS — Z880 Allergy status to penicillin: Secondary | ICD-10-CM

## 2023-11-24 DIAGNOSIS — Z923 Personal history of irradiation: Secondary | ICD-10-CM

## 2023-11-24 DIAGNOSIS — D72819 Decreased white blood cell count, unspecified: Secondary | ICD-10-CM | POA: Diagnosis present

## 2023-11-24 DIAGNOSIS — N1832 Chronic kidney disease, stage 3b: Secondary | ICD-10-CM

## 2023-11-24 DIAGNOSIS — E039 Hypothyroidism, unspecified: Secondary | ICD-10-CM | POA: Diagnosis not present

## 2023-11-24 DIAGNOSIS — I480 Paroxysmal atrial fibrillation: Secondary | ICD-10-CM

## 2023-11-24 LAB — COMPREHENSIVE METABOLIC PANEL
ALT: 13 U/L (ref 0–44)
AST: 21 U/L (ref 15–41)
Albumin: 3.3 g/dL — ABNORMAL LOW (ref 3.5–5.0)
Alkaline Phosphatase: 134 U/L — ABNORMAL HIGH (ref 38–126)
Anion gap: 7 (ref 5–15)
BUN: 18 mg/dL (ref 8–23)
CO2: 25 mmol/L (ref 22–32)
Calcium: 9.4 mg/dL (ref 8.9–10.3)
Chloride: 106 mmol/L (ref 98–111)
Creatinine, Ser: 1.56 mg/dL — ABNORMAL HIGH (ref 0.44–1.00)
GFR, Estimated: 34 mL/min — ABNORMAL LOW (ref >60)
Glucose, Bld: 106 mg/dL — ABNORMAL HIGH (ref 70–99)
Potassium: 3.8 mmol/L (ref 3.5–5.1)
Sodium: 138 mmol/L (ref 135–145)
Total Bilirubin: 1.2 mg/dL (ref 0.0–1.2)
Total Protein: 6.7 g/dL (ref 6.5–8.1)

## 2023-11-24 LAB — CBC
HCT: 25.3 % — ABNORMAL LOW (ref 36.0–46.0)
Hemoglobin: 6.9 g/dL — CL (ref 12.0–15.0)
MCH: 22.7 pg — ABNORMAL LOW (ref 26.0–34.0)
MCHC: 27.3 g/dL — ABNORMAL LOW (ref 30.0–36.0)
MCV: 83.2 fL (ref 80.0–100.0)
Platelets: 176 10*3/uL (ref 150–400)
RBC: 3.04 MIL/uL — ABNORMAL LOW (ref 3.87–5.11)
RDW: 19.7 % — ABNORMAL HIGH (ref 11.5–15.5)
WBC: 2.8 10*3/uL — ABNORMAL LOW (ref 4.0–10.5)
nRBC: 0 % (ref 0.0–0.2)

## 2023-11-24 LAB — I-STAT ARTERIAL BLOOD GAS, ED
Acid-Base Excess: 2 mmol/L (ref 0.0–2.0)
Bicarbonate: 26.7 mmol/L (ref 20.0–28.0)
Calcium, Ion: 1.3 mmol/L (ref 1.15–1.40)
HCT: 23 % — ABNORMAL LOW (ref 36.0–46.0)
Hemoglobin: 7.8 g/dL — ABNORMAL LOW (ref 12.0–15.0)
O2 Saturation: 100 %
Patient temperature: 99.6
Potassium: 3.8 mmol/L (ref 3.5–5.1)
Sodium: 140 mmol/L (ref 135–145)
TCO2: 28 mmol/L (ref 22–32)
pCO2 arterial: 42.6 mm[Hg] (ref 32–48)
pH, Arterial: 7.408 (ref 7.35–7.45)
pO2, Arterial: 174 mm[Hg] — ABNORMAL HIGH (ref 83–108)

## 2023-11-24 LAB — BRAIN NATRIURETIC PEPTIDE: B Natriuretic Peptide: 1051.4 pg/mL — ABNORMAL HIGH (ref 0.0–100.0)

## 2023-11-24 LAB — TROPONIN I (HIGH SENSITIVITY)
Troponin I (High Sensitivity): 33 ng/L — ABNORMAL HIGH (ref <18)
Troponin I (High Sensitivity): 39 ng/L — ABNORMAL HIGH (ref <18)

## 2023-11-24 LAB — POC OCCULT BLOOD, ED: Fecal Occult Bld: NEGATIVE

## 2023-11-24 LAB — MAGNESIUM: Magnesium: 2.5 mg/dL — ABNORMAL HIGH (ref 1.7–2.4)

## 2023-11-24 LAB — RESP PANEL BY RT-PCR (RSV, FLU A&B, COVID)  RVPGX2
Influenza A by PCR: NEGATIVE
Influenza B by PCR: NEGATIVE
Resp Syncytial Virus by PCR: POSITIVE — AB
SARS Coronavirus 2 by RT PCR: NEGATIVE

## 2023-11-24 LAB — I-STAT CG4 LACTIC ACID, ED
Lactic Acid, Venous: 1.8 mmol/L (ref 0.5–1.9)
Lactic Acid, Venous: 0.9 mmol/L (ref 0.5–1.9)

## 2023-11-24 LAB — PREPARE RBC (CROSSMATCH)

## 2023-11-24 MED ORDER — ALLOPURINOL 100 MG PO TABS
50.0000 mg | ORAL_TABLET | Freq: Every day | ORAL | Status: DC
  Administered 2023-11-26 – 2023-12-01 (×6): 50 mg via ORAL
  Filled 2023-11-24 (×7): qty 1

## 2023-11-24 MED ORDER — ACETAMINOPHEN 325 MG PO TABS
650.0000 mg | ORAL_TABLET | ORAL | Status: DC
  Filled 2023-11-24: qty 2

## 2023-11-24 MED ORDER — SODIUM CHLORIDE 0.9% IV SOLUTION: Freq: Once | INTRAVENOUS | Status: AC

## 2023-11-24 MED ORDER — TRAMADOL HCL 50 MG PO TABS
50.0000 mg | ORAL_TABLET | Freq: Four times a day (QID) | ORAL | Status: DC | PRN
  Administered 2023-11-25 – 2023-12-01 (×8): 50 mg via ORAL
  Filled 2023-11-24 (×8): qty 1

## 2023-11-24 MED ORDER — ALBUTEROL SULFATE (2.5 MG/3ML) 0.083% IN NEBU
2.5000 mg | INHALATION_SOLUTION | RESPIRATORY_TRACT | Status: DC | PRN
  Administered 2023-11-27 – 2023-11-28 (×2): 2.5 mg via RESPIRATORY_TRACT
  Filled 2023-11-24 (×2): qty 3

## 2023-11-24 MED ORDER — FUROSEMIDE 10 MG/ML IJ SOLN
80.0000 mg | Freq: Two times a day (BID) | INTRAMUSCULAR | Status: DC
  Administered 2023-11-25 – 2023-11-27 (×5): 80 mg via INTRAVENOUS
  Filled 2023-11-24 (×5): qty 8

## 2023-11-24 MED ORDER — ENSURE ENLIVE PO LIQD
30.0000 mL | Freq: Two times a day (BID) | ORAL | Status: DC
Start: 2023-11-24 — End: 2023-12-01
  Administered 2023-11-24 – 2023-12-01 (×12): 30 mL via ORAL

## 2023-11-24 MED ORDER — SODIUM CHLORIDE 0.9 % IV SOLN
1.0000 g | Freq: Once | INTRAVENOUS | Status: DC
  Administered 2023-11-24: 1 g via INTRAVENOUS
  Filled 2023-11-24: qty 10

## 2023-11-24 MED ORDER — METHYLPREDNISOLONE SODIUM SUCC 40 MG IJ SOLR
40.0000 mg | Freq: Once | INTRAMUSCULAR | Status: AC
  Administered 2023-11-24: 40 mg via INTRAVENOUS
  Filled 2023-11-24: qty 1

## 2023-11-24 MED ORDER — CARVEDILOL 12.5 MG PO TABS
12.5000 mg | ORAL_TABLET | Freq: Two times a day (BID) | ORAL | Status: DC
  Administered 2023-11-25 – 2023-12-01 (×13): 12.5 mg via ORAL
  Filled 2023-11-24 (×13): qty 1

## 2023-11-24 MED ORDER — PANTOPRAZOLE SODIUM 40 MG PO TBEC
40.0000 mg | DELAYED_RELEASE_TABLET | Freq: Every day | ORAL | Status: DC
  Administered 2023-11-25 – 2023-12-01 (×7): 40 mg via ORAL
  Filled 2023-11-24 (×7): qty 1

## 2023-11-24 MED ORDER — ACETAMINOPHEN 325 MG PO TABS
650.0000 mg | ORAL_TABLET | Freq: Four times a day (QID) | ORAL | Status: DC | PRN
  Administered 2023-11-28 – 2023-12-01 (×5): 650 mg via ORAL
  Filled 2023-11-24 (×6): qty 2

## 2023-11-24 MED ORDER — ACETAMINOPHEN 650 MG RE SUPP: 650.0000 mg | Freq: Four times a day (QID) | RECTAL | Status: DC | PRN

## 2023-11-24 MED ORDER — LEVOTHYROXINE SODIUM 50 MCG PO TABS
50.0000 ug | ORAL_TABLET | Freq: Every day | ORAL | Status: DC
  Administered 2023-11-25 – 2023-11-29 (×5): 50 ug via ORAL
  Filled 2023-11-24 (×5): qty 1

## 2023-11-24 MED ORDER — RIFAXIMIN 550 MG PO TABS
550.0000 mg | ORAL_TABLET | Freq: Two times a day (BID) | ORAL | Status: DC
  Administered 2023-11-24 – 2023-12-01 (×14): 550 mg via ORAL
  Filled 2023-11-24 (×15): qty 1

## 2023-11-24 MED ORDER — SPIRONOLACTONE 25 MG PO TABS
25.0000 mg | ORAL_TABLET | Freq: Every day | ORAL | Status: DC
  Administered 2023-11-25 – 2023-12-01 (×7): 25 mg via ORAL
  Filled 2023-11-24 (×7): qty 1

## 2023-11-24 MED ORDER — POLYETHYLENE GLYCOL 3350 17 G PO PACK: 17.0000 g | PACK | Freq: Every day | ORAL | Status: DC | PRN

## 2023-11-24 MED ORDER — PREDNISONE 20 MG PO TABS
40.0000 mg | ORAL_TABLET | Freq: Every day | ORAL | Status: DC
Start: 2023-11-25 — End: 2023-11-30
  Administered 2023-11-25 – 2023-11-30 (×6): 40 mg via ORAL
  Filled 2023-11-24 (×6): qty 2

## 2023-11-24 MED ORDER — APIXABAN 5 MG PO TABS
5.0000 mg | ORAL_TABLET | Freq: Two times a day (BID) | ORAL | Status: DC
  Administered 2023-11-24 – 2023-12-01 (×14): 5 mg via ORAL
  Filled 2023-11-24 (×14): qty 1

## 2023-11-24 MED ORDER — LACTULOSE 10 GM/15ML PO SOLN
10.0000 g | Freq: Three times a day (TID) | ORAL | Status: DC
  Administered 2023-11-24 – 2023-11-25 (×3): 10 g via ORAL
  Filled 2023-11-24 (×5): qty 15

## 2023-11-24 MED ORDER — FUROSEMIDE 10 MG/ML IJ SOLN
80.0000 mg | Freq: Once | INTRAMUSCULAR | Status: AC
  Administered 2023-11-24: 80 mg via INTRAVENOUS
  Filled 2023-11-24: qty 8

## 2023-11-24 MED ORDER — SODIUM CHLORIDE 0.9% FLUSH
3.0000 mL | Freq: Two times a day (BID) | INTRAVENOUS | Status: DC
  Administered 2023-11-24 – 2023-12-01 (×13): 3 mL via INTRAVENOUS

## 2023-11-24 MED ORDER — AZITHROMYCIN 500 MG IV SOLR: 500.0000 mg | Freq: Once | INTRAVENOUS | Status: DC

## 2023-11-24 NOTE — ED Notes (Signed)
This paramedic sat with patient for first 15 minutes of blood transfusion. Pt did not experience any side effects. IV team at pt bedside at this time.

## 2023-11-24 NOTE — ED Provider Notes (Signed)
[facility] AT [facility] Provider Note   CSN: [id number] Arrival date & time: 11/24/23  1014     History  Chief Complaint  Patient presents with   Shortness of Breath    Katelyn Jackson is a 78 y.o. female with medical history of permanent cardiac pacemaker, chronic A-fib on Eliquis, CKD, GERD, hypertension, hypothyroid, lymphedema, moderate to severe pulmonary hypertension.  Patient presents to ED for evaluation of shortness of breath, fever.  Patient arrives on CPAP with EMS.  The patient apparently became sick a few days ago.  Patient son reports the patient began to have a cough, worsening shortness of breath that began last night.  Patient son denies any fevers at home however states that they have been giving the patient Tylenol.  Patient endorsing chest pain, pain in her legs and pain in her head.  Denies any nausea or vomiting at home.  Hard historian due to patient being on CPAP.  Once arrived to the ED, patient transition to BiPAP.   Shortness of Breath      Home Medications Prior to Admission medications   Medication Sig Start Date End Date Taking? Authorizing Provider  allopurinol (ZYLOPRIM) 100 MG tablet Take 0.5 tablets (50 mg total) by mouth daily. 11/02/23   Plotnikov, Georgina Quint, MD  apixaban (ELIQUIS) 5 MG TABS tablet Take 1 tablet (5 mg total) by mouth 2 (two) times daily. 11/02/23   Plotnikov, Georgina Quint, MD  bisacodyl (DULCOLAX) 10 MG suppository Place 10 mg rectally as needed for moderate constipation.    [provider]  carvedilol (COREG) 12.5 MG tablet Take 1 tablet (12.5 mg total) by mouth 2 (two) times daily with a meal. 11/02/23   Plotnikov, Georgina Quint, MD  cyanocobalamin (VITAMIN B12) 1000 MCG/ML injection Inject 1 mL (1,000 mcg total) into the skin every 30 (thirty) days. 11/02/23 11/01/24  Plotnikov, Georgina Quint, MD  lactulose (CHRONULAC) 10 GM/15ML solution TAKE 15 MILLILITERS BY MOUTH THREE TIMESDAILY 11/02/23    Plotnikov, Georgina Quint, MD  levothyroxine (SYNTHROID) 50 MCG tablet Take 1 tablet (50 mcg total) by mouth daily before breakfast. Overdue for Annual appt must see provider for future refills 11/02/23   Plotnikov, Georgina Quint, MD  lovastatin (MEVACOR) 20 MG tablet Take 20 mg by mouth daily. Patient not taking: Reported on 11/24/2023 11/02/23   [provider]  magnesium oxide (MAG-OX) 400 MG tablet Take 1 tablet (400 mg total) by mouth 2 (two) times daily. 11/02/23   Plotnikov, Georgina Quint, MD  Menthol-Zinc Oxide (CALMOSEPTINE) 0.44-20.6 % OINT Apply 1 application  topically in the morning and at bedtime.    [provider]  Nystatin (GERHARDT'S BUTT CREAM) CREA Apply 1 Application topically 2 (two) times daily. 06/15/23   Azucena Fallen, MD  Olopatadine HCl 0.2 % SOLN Place 1 drop into the right eye in the morning and at bedtime.    [provider]  omeprazole (PRILOSEC) 40 MG capsule Take 1 capsule (40 mg total) by mouth daily. 11/02/23   Plotnikov, Georgina Quint, MD  polyethylene glycol (MIRALAX / GLYCOLAX) 17 g packet Take 17 g by mouth 2 (two) times daily. 11/02/23   Plotnikov, Georgina Quint, MD  potassium chloride (KLOR-CON) 8 MEQ tablet Take 1 tablet (8 mEq total) by mouth daily. 11/02/23   Plotnikov, Georgina Quint, MD  protein supplement (PROSOURCE NO CARB) LIQD Take 30 mLs by mouth 2 (two) times daily.    [provider]  rifaximin (XIFAXAN) 550 MG TABS  tablet Take 1 tablet (550 mg total) by mouth 2 (two) times daily. 11/02/23   Plotnikov, Georgina Quint, MD  sennosides-docusate sodium (SENOKOT-S) 8.6-50 MG tablet Take 2 tablets by mouth every 12 (twelve) hours. 11/02/23   Plotnikov, Georgina Quint, MD  spironolactone (ALDACTONE) 25 MG tablet Take 1 tablet (25 mg total) by mouth daily. 11/02/23   Plotnikov, Georgina Quint, MD  torsemide (DEMADEX) 20 MG tablet Take 3 tablets (60 mg total) by mouth daily. 11/02/23   Plotnikov, Georgina Quint, MD  traMADol (ULTRAM) 50 MG tablet Take 1 tablet  (50 mg total) by mouth every 6 (six) hours as needed. 11/02/23   Plotnikov, Georgina Quint, MD  triamcinolone cream (KENALOG) 0.1 % Apply 1 Application topically 2 (two) times daily. To dry patches on BLE and bilateral feet 11/02/23   Plotnikov, Georgina Quint, MD      Allergies    Hibiclens [chlorhexidine gluconate], Lovastatin, Zetia [ezetimibe], and Penicillins    Review of Systems   Review of Systems  Unable to perform ROS: Acuity of condition (Level 5 caveat)  Respiratory:  Positive for shortness of breath.     Physical Exam Updated Vital Signs BP (!) 109/46   Pulse 65   Temp 98.3 F (36.8 C) (Oral)   Resp (!) 22   Ht 5\' 1"  (1.549 m)   Wt 80 kg   LMP  (LMP Unknown)   SpO2 100%   BMI 33.32 kg/m  Physical Exam Vitals and nursing note reviewed.  Constitutional:      General: She is in acute distress.     Appearance: Normal appearance. She is ill-appearing. She is not diaphoretic.  HENT:     Head: Normocephalic and atraumatic.  Eyes:     Extraocular Movements: Extraocular movements intact.     Pupils: Pupils are equal, round, and reactive to light.  Cardiovascular:     Rate and Rhythm: Normal rate and regular rhythm.  Pulmonary:     Effort: Respiratory distress present.     Breath sounds: Normal breath sounds. Stridor present. No wheezing.     Comments: Distant lung sounds bilaterally Abdominal:     General: Abdomen is flat.     Tenderness: There is no abdominal tenderness.  Musculoskeletal:     Cervical back: Normal range of motion and neck supple.     Right lower leg: Edema present.     Left lower leg: Edema present.     Comments: Bilateral 2+ edema, appears chronic  Skin:    Capillary Refill: Capillary refill takes less than 2 seconds.  Neurological:     General: No focal deficit present.     Mental Status: She is alert and oriented to person, place, and time. Mental status is at baseline.     ED Results / Procedures / Treatments   Labs (all labs ordered are  listed, but only abnormal results are displayed) Labs Reviewed  RESP PANEL BY RT-PCR (RSV, FLU A&B, COVID)  RVPGX2 - Abnormal; Notable for the following components:      Result Value   Resp Syncytial Virus by PCR POSITIVE (*)    All other components within normal limits  CBC - Abnormal; Notable for the following components:   WBC 2.8 (*)    RBC 3.04 (*)    Hemoglobin 6.9 (*)    HCT 25.3 (*)    MCH 22.7 (*)    MCHC 27.3 (*)    RDW 19.7 (*)    All other components within normal limits  BRAIN NATRIURETIC PEPTIDE - Abnormal; Notable for the following components:   B Natriuretic Peptide 1,051.4 (*)    All other components within normal limits  COMPREHENSIVE METABOLIC PANEL - Abnormal; Notable for the following components:   Glucose, Bld 106 (*)    Creatinine, Ser 1.56 (*)    Albumin 3.3 (*)    Alkaline Phosphatase 134 (*)    GFR, Estimated 34 (*)    All other components within normal limits  I-STAT ARTERIAL BLOOD GAS, ED - Abnormal; Notable for the following components:   pO2, Arterial 174 (*)    HCT 23.0 (*)    Hemoglobin 7.8 (*)    All other components within normal limits  TROPONIN I (HIGH SENSITIVITY) - Abnormal; Notable for the following components:   Troponin I (High Sensitivity) 33 (*)    All other components within normal limits  TROPONIN I (HIGH SENSITIVITY) - Abnormal; Notable for the following components:   Troponin I (High Sensitivity) 39 (*)    All other components within normal limits  CULTURE, BLOOD (ROUTINE X 2)  CULTURE, BLOOD (ROUTINE X 2)  I-STAT CG4 LACTIC ACID, ED  POC OCCULT BLOOD, ED  I-STAT CG4 LACTIC ACID, ED  TYPE AND SCREEN  PREPARE RBC (CROSSMATCH)    EKG EKG Interpretation Date/Time:  Friday November 24 2023 10:23:00 EST Ventricular Rate:  82 PR Interval:    QRS Duration:  154 QT Interval:  423 QTC Calculation: 495 R Axis:   -54  Text Interpretation: Atrial fibrillation RBBB and LAFB LVH with secondary repolarization abnormality hr has  increased since first prior ekg of 08/22/23 Confirmed by Margarita Grizzle [id number] on 11/24/2023 11:59:04 AM  Radiology DG Chest Portable 1 View Result Date: 11/24/2023 CLINICAL DATA:  Shortness of breath.  COPD. EXAM: PORTABLE CHEST 1 VIEW COMPARISON:  08/20/2023. FINDINGS: Low lung volume. Redemonstration of diffuse increased interstitial markings, grossly similar to the prior study. Findings are nonspecific and differential diagnosis include underlying interstitial lung disease, pulmonary edema or multilobar pneumonia. Correlate clinically. No acute dense consolidation or lung collapse. Bilateral costophrenic angles are clear. Stable moderately enlarged cardio-mediastinal silhouette. There is a left sided 2-lead pacemaker. No acute osseous abnormalities. The soft tissues are within normal limits. IMPRESSION: *Redemonstration of diffuse increased interstitial markings, as discussed above. Electronically Signed   By: Jules Schick M.D.   On: 11/24/2023 13:25    Procedures .Critical Care  Performed by: Al Decant, PA-C Authorized by: Al Decant, PA-C   Critical care provider statement:    Critical care time (minutes):  75   Critical care time was exclusive of:  Separately billable procedures and treating other patients and teaching time   Critical care was necessary to treat or prevent imminent or life-threatening deterioration of the following conditions:  Respiratory failure (Symptomatic anemia)   Critical care was time spent personally by me on the following activities:  Blood draw for specimens, development of treatment plan with patient or surrogate, discussions with consultants, discussions with primary provider, evaluation of patient's response to treatment, examination of patient, ordering and review of laboratory studies, ordering and performing treatments and interventions, ordering and review of radiographic studies, re-evaluation of patient's condition, pulse oximetry and  review of old charts   I assumed direction of critical care for this patient from another provider in my specialty: no     Care discussed with: admitting provider      Medications Ordered in ED Medications  cefTRIAXone (ROCEPHIN) 1 g in sodium chloride 0.9 %  100 mL IVPB (has no administration in time range)  azithromycin (ZITHROMAX) 500 mg in sodium chloride 0.9 % 250 mL IVPB (has no administration in time range)  0.9 %  sodium chloride infusion (Manually program via Guardrails IV Fluids) ( Intravenous New Bag/Given 11/24/23 1150)  furosemide (LASIX) injection 80 mg (80 mg Intravenous Given 11/24/23 1149)    ED Course/ Medical Decision Making/ A&P Clinical Course as of 11/24/23 1519  Fri Nov 24, 2023  1149 CHF exacerbation, RSV positive [CG]  1157 EKG shows afib, chronic afib in chart. On AC. Chest xray largely unchanged since October Chest xray [CG]  1159 Respiratory Syncytial Virus by PCR(!): POSITIVE [CG]  1159 Hemoglobin(!!): 6.9 [CG]  1159 B Natriuretic Peptide(!): 1,051.4 [CG]  1252 Got sick a couple of days ago. Coughing, no fever. Last night started complaining of SOB. Compliant on medications.  [CG]  1423 Arrived on CPAP, transitioned to BiPap. Weaned off of bipap and now satting comfortably on 2L of 02 via [place] [CG]  1442 Admit for chf exacerbation, RSV positive with possible pneumonia, symptomatic anemia [CG]    Clinical Course User Index [CG] Al Decant, PA-C   Medical Decision Making Amount and/or Complexity of Data Reviewed Labs: ordered. Decision-making details documented in ED Course. Radiology: ordered.  Risk Prescription drug management. Decision regarding hospitalization.   78 year old female presents for evaluation.  Please see HPI for further details.  On examination the patient is afebrile, nontachycardic.  Her lung sounds are distant bilaterally, she is hypoxic, she appears to be in respiratory distress.  She arrives on CPAP with transition of  BiPAP at this time.  Her abdomen is soft and compressible.  Her lower extremities are edematous but this appears to be chronic and she has a history of lymphedema in her chart.  Neurological examination is at baseline.  Patient complaining of pain "all over".  Labs ordered include CBC, CMP, BNP, i-STAT ABG, troponin x 2, blood culture x 2, lactic x 2, viral panel, fecal occult card.  Patient CBC without leukocytosis, hemoglobin 6.9.  Patient appears to have chronically low hemoglobin however she is symptomatic and hemoglobin is 6.9 so we will transfuse with blood.  Patient metabolic panel shows baseline creatinine 1.56, no electrolyte derangement, alk phos 134, albumin 3.3.  BNP elevated 1,051.  Patient reports compliance on torsemide, provided with 80 mg of Lasix.  Viral panel positive for RSV.  I-STAT ABG unremarkable.  Lactic acid not elevated at 1.8, on reassessment decreased 0.9.  Fecal occult card negative.  Patient was weaned off of BiPAP and is resting comfortably on 2 L of oxygen via nasal cannula.  Chest x-ray shows concern for pneumonia, started on CAP antibiotics at this time.  At this time the patient requires admission to the hospital for possible pneumonia, symptomatic anemia, hypoxia as well as a CHF exacerbation.  Patient admitted at this time to Dr. Alinda Money of hospitalist service.   Final Clinical Impression(s) / ED Diagnoses Final diagnoses:  Dyspnea, unspecified type  Community acquired pneumonia, unspecified laterality  Anemia, unspecified type  RSV (respiratory syncytial virus pneumonia)    Rx / DC Orders ED Discharge Orders     None         Al Decant, PA-C 11/24/23 1519    Margarita Grizzle, MD 11/30/23 1115

## 2023-11-24 NOTE — ED Triage Notes (Addendum)
Pt to ED via [facility] from home. Pt has been feeling sick for past few days, 102 fever. Pt started feeling short of breath this morning. Pt was 90% on room air, absent BS in lowers per EMS, CPAP, PEEP 7.5 en route. Pt has had ntg x 3, tylenol 650, bilateral 18g Acs. Pt alert, c/o pain in head, chest and legs.  170/90 > 118/70 60-70bpm 90-99% on CPAP 35-40 ETCO2

## 2023-11-24 NOTE — Progress Notes (Addendum)
Per MD verbal order, RT place pt on BiPAP (servo). Pt tolerating well at this time.

## 2023-11-24 NOTE — ED Notes (Addendum)
BI-pap removed per Dr. Rosalia Hammers and patient placed on 6L [place]. Pt tolerating well. Pt had a lot of nasal congestion. After that was cleared, pt states the [place] feels better.

## 2023-11-24 NOTE — ED Notes (Signed)
Pt states she is hot, sheet removed. Pt temp 98.5 oral. Pt also given water.

## 2023-11-24 NOTE — H&P (Signed)
History and Physical   Evelett Chiara UEA:[id number] DOB: 1946/10/14 DOA: 11/24/2023  PCP: Tresa Garter, MD   Patient coming from: Home  Chief Complaint: Shortness of breath  HPI: Katelyn Jackson is a 78 y.o. female with medical history significant of hypertension, hyperlipidemia, atrial fibrillation, anemia, GERD, hypothyroidism, pacemaker, CKD 3B, obesity, NASH cirrhosis, CHF, breast cancer, lymphedema presenting with worsening shortness of breath.  History obtained with assistance of chart review and translation. Patient began to feel unwell several days ago.  She has had a cough during that time.  Yesterday she began to have worsening shortness of breath.  Became significantly worse this morning prompting call to EMS.  EMS had to place patient on CPAP and route.  Transition to BiPAP in the ED and ultimately weaned to nasal cannula.  Denies fevers, chills but has been receiving Tylenol.  Is reporting some chest pain and muscle aches.  Denies abdominal pain, constipation, diarrhea, nausea, vomiting.  ED Course: Vital signs in the ED notable for blood pressure in the 120s to 150s systolic, respirate in the 20s.  Initially on BiPAP now weaned to 5 to 6 L nasal cannula.  Lab workup included CMP with creatinine 1.56 which is increased from baseline of 1.2-1.3, albumin 3.3, alk phos 134.  CBC with leukopenia at 2.8, anemia at 6.9 down from baseline of 7-8.  BNP elevated to 1051.  Troponin flat at 33 and then 39 on repeat.  Lactic acid negative x 2.  RSV positive.  FOBT negative.  Type and screen performed.  ABG with normal pH and normal pCO2.  Blood cultures pending.  Chest x-ray showing some redemonstrated interstitial markings similar to previous exam that could represent edema versus pneumonia versus interstitial disease.  Patient ordered ceftriaxone and azithromycin in the ED as well as 80 mg IV Lasix and 1 unit PRBC transfusion.  Review of Systems: As per HPI otherwise all other  systems reviewed and are negative.  Past Medical History:  Diagnosis Date   Acute lower UTI 06/19/2017   Arthritis    Breast lesion 12/07/2021   Cancer (HCC)    breast cancer   Chronic atrial fibrillation (HCC)    Chronic kidney disease    sees Dr Lowell Guitar   Colonization with VRE (vancomycin-resistant enterococcus) 09/22/2021   Cramps, muscle, general    Dry skin 09/22/2021   Dyspnea on exertion    Dysrhythmia    a-fib,    ESBL E. coli carrier 09/22/2021   GERD (gastroesophageal reflux disease)    Headache    Herpes labialis 08/19/2021   Hyperlipidemia    Hypertension    Hypothyroidism    Lymphedema    Moderate to severe pulmonary hypertension (HCC)    Obesity    Osteoarthritis of right knee 08/19/2021   Personal history of radiation therapy    Pneumonia 12/2020   Presence of permanent cardiac pacemaker 01/21/2021   for bradycardia    Syncope 12/2020   needed a pacemaker   UTI (urinary tract infection) 01/05/2021    Past Surgical History:  Procedure Laterality Date   APPENDECTOMY     BIOPSY  08/27/2023   Procedure: BIOPSY;  Surgeon: Kerin Salen, MD;  Location: [facility] ENDOSCOPY;  Service: Gastroenterology;;   BREAST BIOPSY Left 2018   BREAST LUMPECTOMY Left 04/04/2017   x3   BREAST LUMPECTOMY WITH NEEDLE LOCALIZATION AND AXILLARY SENTINEL LYMPH NODE BX Left 04/04/2017   Procedure: BREAST LUMPECTOMY WITH NEEDLE LOCALIZATION x3 AND AXILLARY SENTINEL LYMPH NODE BX;  Surgeon: Donell Beers,  Darrick Huntsman, MD;  Location: [facility] OR;  Service: General;  Laterality: Left;   CATARACT EXTRACTION W/ INTRAOCULAR LENS  IMPLANT, BILATERAL Bilateral 2018   COLONOSCOPY     ESOPHAGOGASTRODUODENOSCOPY (EGD) WITH PROPOFOL N/A 08/27/2023   Procedure: ESOPHAGOGASTRODUODENOSCOPY (EGD) WITH PROPOFOL;  Surgeon: Kerin Salen, MD;  Location: [facility] ENDOSCOPY;  Service: Gastroenterology;  Laterality: N/A;   EYE SURGERY     GLAUCOMA SURGERY Bilateral 2018   HARDWARE REMOVAL Right 07/26/2021   Procedure: RIGHT SHOULDER  HARDWARE REMOVAL WITH WASHOUT;  Surgeon: Bjorn Pippin, MD;  Location: [facility] ORS;  Service: Orthopedics;  Laterality: Right;   INSERT / REPLACE / REMOVE PACEMAKER  01/21/2021   IR FLUORO GUIDE CV LINE LEFT  07/29/2021   IR REMOVAL TUN CV CATH W/O FL  09/07/2021   IR US GUIDE BX ASP/DRAIN  07/16/2021   RE-EXCISION OF BREAST CANCER,SUPERIOR MARGINS Left 04/26/2017   Procedure: RE-EXCISION OF LEFT BREAST CANCER;  Surgeon: Almond Lint, MD;  Location: [facility] OR;  Service: General;  Laterality: Left;   SHOULDER HEMI-ARTHROPLASTY Right 03/17/2021   Procedure: SHOULDER HEMI-ARTHROPLASTY;  Surgeon: Bjorn Pippin, MD;  Location: [facility] ORS;  Service: Orthopedics;  Laterality: Right;    Social History  reports that she has never smoked. She has never used smokeless tobacco. She reports that she does not drink alcohol and does not use drugs.  Allergies  Allergen Reactions   Hibiclens [Chlorhexidine Gluconate] Hives   Lovastatin Other (See Comments)    cramps   Zetia [Ezetimibe] Other (See Comments)    cramps   Penicillins Itching    Tolerated Cephalosporin Date: 03/17/21. Td Ancef 2g 07/26/21    Family History  Problem Relation Age of Onset   CAD Mother 29   Stroke Mother 90       hemorr CVA   COPD Father 86   Cancer Neg Hx   Reviewed on admission  Prior to Admission medications   Medication Sig Start Date End Date Taking? Authorizing Provider  allopurinol (ZYLOPRIM) 100 MG tablet Take 0.5 tablets (50 mg total) by mouth daily. 11/02/23   Plotnikov, Georgina Quint, MD  apixaban (ELIQUIS) 5 MG TABS tablet Take 1 tablet (5 mg total) by mouth 2 (two) times daily. 11/02/23   Plotnikov, Georgina Quint, MD  bisacodyl (DULCOLAX) 10 MG suppository Place 10 mg rectally as needed for moderate constipation.    [provider]  carvedilol (COREG) 12.5 MG tablet Take 1 tablet (12.5 mg total) by mouth 2 (two) times daily with a meal. 11/02/23   Plotnikov, Georgina Quint, MD  cyanocobalamin (VITAMIN B12) 1000 MCG/ML  injection Inject 1 mL (1,000 mcg total) into the skin every 30 (thirty) days. 11/02/23 11/01/24  Plotnikov, Georgina Quint, MD  lactulose (CHRONULAC) 10 GM/15ML solution TAKE 15 MILLILITERS BY MOUTH THREE TIMESDAILY 11/02/23   Plotnikov, Georgina Quint, MD  levothyroxine (SYNTHROID) 50 MCG tablet Take 1 tablet (50 mcg total) by mouth daily before breakfast. Overdue for Annual appt must see provider for future refills 11/02/23   Plotnikov, Georgina Quint, MD  lovastatin (MEVACOR) 20 MG tablet Take 20 mg by mouth daily. Patient not taking: Reported on 11/24/2023 11/02/23   [provider]  magnesium oxide (MAG-OX) 400 MG tablet Take 1 tablet (400 mg total) by mouth 2 (two) times daily. 11/02/23   Plotnikov, Georgina Quint, MD  Menthol-Zinc Oxide (CALMOSEPTINE) 0.44-20.6 % OINT Apply 1 application  topically in the morning and at bedtime.    [provider]  Nystatin (GERHARDT'S BUTT CREAM) CREA Apply  1 Application topically 2 (two) times daily. 06/15/23   Azucena Fallen, MD  Olopatadine HCl 0.2 % SOLN Place 1 drop into the right eye in the morning and at bedtime.    [provider]  omeprazole (PRILOSEC) 40 MG capsule Take 1 capsule (40 mg total) by mouth daily. 11/02/23   Plotnikov, Georgina Quint, MD  polyethylene glycol (MIRALAX / GLYCOLAX) 17 g packet Take 17 g by mouth 2 (two) times daily. 11/02/23   Plotnikov, Georgina Quint, MD  potassium chloride (KLOR-CON) 8 MEQ tablet Take 1 tablet (8 mEq total) by mouth daily. 11/02/23   Plotnikov, Georgina Quint, MD  protein supplement (PROSOURCE NO CARB) LIQD Take 30 mLs by mouth 2 (two) times daily.    [provider]  rifaximin (XIFAXAN) 550 MG TABS tablet Take 1 tablet (550 mg total) by mouth 2 (two) times daily. 11/02/23   Plotnikov, Georgina Quint, MD  sennosides-docusate sodium (SENOKOT-S) 8.6-50 MG tablet Take 2 tablets by mouth every 12 (twelve) hours. 11/02/23   Plotnikov, Georgina Quint, MD  spironolactone (ALDACTONE) 25 MG tablet Take 1 tablet (25 mg  total) by mouth daily. 11/02/23   Plotnikov, Georgina Quint, MD  torsemide (DEMADEX) 20 MG tablet Take 3 tablets (60 mg total) by mouth daily. 11/02/23   Plotnikov, Georgina Quint, MD  traMADol (ULTRAM) 50 MG tablet Take 1 tablet (50 mg total) by mouth every 6 (six) hours as needed. 11/02/23   Plotnikov, Georgina Quint, MD  triamcinolone cream (KENALOG) 0.1 % Apply 1 Application topically 2 (two) times daily. To dry patches on BLE and bilateral feet 11/02/23   Plotnikov, Georgina Quint, MD    Physical Exam: Vitals:   11/24/23 1300 11/24/23 1325 11/24/23 1420 11/24/23 1430  BP: 110/62  (!) 102/43 (!) 109/46  Pulse: 65  65 65  Resp: 20  (!) 25 (!) 22  Temp:  98.5 F (36.9 C) 98.3 F (36.8 C)   TempSrc:  Oral Oral   SpO2: 100%  100% 100%  Weight:      Height:        Physical Exam Constitutional:      General: She is not in acute distress.    Appearance: Normal appearance. She is obese. She is ill-appearing.  HENT:     Head: Normocephalic and atraumatic.     Mouth/Throat:     Mouth: Mucous membranes are moist.     Pharynx: Oropharynx is clear.  Eyes:     Extraocular Movements: Extraocular movements intact.     Pupils: Pupils are equal, round, and reactive to light.  Cardiovascular:     Rate and Rhythm: Normal rate and regular rhythm.     Pulses: Normal pulses.     Heart sounds: Normal heart sounds.  Pulmonary:     Effort: Pulmonary effort is normal. No respiratory distress.     Breath sounds: Wheezing and rales present.  Abdominal:     General: Bowel sounds are normal. There is no distension.     Palpations: Abdomen is soft.     Tenderness: There is no abdominal tenderness.  Musculoskeletal:        General: No swelling or deformity.     Right lower leg: Edema present.     Left lower leg: Edema present.  Skin:    General: Skin is warm and dry.  Neurological:     General: No focal deficit present.     Mental Status: Mental status is at baseline.     Labs on Admission:  I have personally  reviewed following labs and imaging studies  CBC: Recent Labs  Lab 11/24/23 1044 11/24/23 1124  WBC 2.8*  --   HGB 6.9* 7.8*  HCT 25.3* 23.0*  MCV 83.2  --   PLT 176  --     Basic Metabolic Panel: Recent Labs  Lab 11/24/23 1044 11/24/23 1124  NA 138 140  K 3.8 3.8  CL 106  --   CO2 25  --   GLUCOSE 106*  --   BUN 18  --   CREATININE 1.56*  --   CALCIUM 9.4  --     GFR: Estimated Creatinine Clearance: 28.9 mL/min (A) (by C-G formula based on SCr of 1.56 mg/dL (H)).  Liver Function Tests: Recent Labs  Lab 11/24/23 1044  AST 21  ALT 13  ALKPHOS 134*  BILITOT 1.2  PROT 6.7  ALBUMIN 3.3*    Urine analysis:    Component Value Date/Time   COLORURINE YELLOW 08/20/2023 2246   APPEARANCEUR TURBID (A) 08/20/2023 2246   LABSPEC 1.010 08/20/2023 2246   PHURINE 6.0 08/20/2023 2246   GLUCOSEU NEGATIVE 08/20/2023 2246   GLUCOSEU NEGATIVE 07/11/2017 1108   HGBUR SMALL (A) 08/20/2023 2246   BILIRUBINUR NEGATIVE 08/20/2023 2246   KETONESUR NEGATIVE 08/20/2023 2246   PROTEINUR 30 (A) 08/20/2023 2246   UROBILINOGEN 0.2 07/11/2017 1108   NITRITE NEGATIVE 08/20/2023 2246   LEUKOCYTESUR LARGE (A) 08/20/2023 2246    Radiological Exams on Admission: DG Chest Portable 1 View Result Date: 11/24/2023 CLINICAL DATA:  Shortness of breath.  COPD. EXAM: PORTABLE CHEST 1 VIEW COMPARISON:  08/20/2023. FINDINGS: Low lung volume. Redemonstration of diffuse increased interstitial markings, grossly similar to the prior study. Findings are nonspecific and differential diagnosis include underlying interstitial lung disease, pulmonary edema or multilobar pneumonia. Correlate clinically. No acute dense consolidation or lung collapse. Bilateral costophrenic angles are clear. Stable moderately enlarged cardio-mediastinal silhouette. There is a left sided 2-lead pacemaker. No acute osseous abnormalities. The soft tissues are within normal limits. IMPRESSION: *Redemonstration of diffuse increased  interstitial markings, as discussed above. Electronically Signed   By: Jules Schick M.D.   On: 11/24/2023 13:25   EKG: Independently reviewed.  Atrial fibrillation at 82 bpm.  Right bundle blanch block with QRS 154.  Evidence of LVH with repolarization abnormality.  Assessment/Plan Active Problems:   Chronic atrial fibrillation (HCC)   History of pacemaker   Essential hypertension   Hypothyroidism   Liver cirrhosis (HCC)   Dyslipidemia   Malignant neoplasm of upper-outer quadrant of left breast in female, estrogen receptor positive (HCC)   Anemia of chronic disease   Morbid obesity (HCC)   GERD (gastroesophageal reflux disease)   Acute renal failure superimposed on stage 3b chronic kidney disease (HCC)   Acute on chronic systolic CHF (congestive heart failure) (HCC)   RSV (respiratory syncytial virus pneumonia)   Acute respiratory failure with hypoxia (HCC)   Acute respiratory failure with hypoxia Acute on chronic systolic CHF RSV infection > Patient presenting with worsening shortness of breath for the past day.  Has had a cough for several days.  Also reporting some chest pain and muscle aches. > Positive for RSV in the ED which may explain acute worsening in her symptoms.  However CHF exacerbation likely contributing as well. > Last echo was 3 months ago with EF 40 and 45%, indeterminate diastolic function, moderately reduced RV function. > Currently has chronic lymphedema, evidence of possible edema on chest x-ray, BNP elevated to 1051.  Significant wheezing. > Patient received BiPAP in the ED and transition to nasal cannula.  Also received a dose of IV Lasix, ceftriaxone, azithromycin. > Did receive 1 unit PRBC transfusion as below, so we will monitor for any evidence of overload from this, though did receive significant Lasix dose to counteract it. - Monitor on progressive unit overnight - Continue with supplemental oxygen, wean as tolerated - BiPAP available as needed, will  monitor closely For acute on chronic CHF - Continue with Lasix 80 mg twice daily (as tolerated by BP) - Last echo 3 months ago - Strict I's and O's, daily weights - Check magnesium - Trend renal function and electrolytes - Continue on spironolactone, carvedilol For RSV - Hold off on antibiotics, RSV suspected etiology - Consider Procalcitonin if concern arises for bacterial infection  - Steroids: Solumedrol today, followed by daily prednisone - Pulmonary toilet - As needed albuterol  ?Symptomatic anemia on chronic iron deficiency anemia > Presenting with shortness of breath as above.  Now with hemoglobin 6.9 down from baseline of 7-8. > Unclear if the degree of symptomatic anemia is contributing to her symptoms in addition to the RSV and CHF exacerbation. > Likely is a component of hemodilution given presumed volume overload, however with CHF goal hemoglobin of 8 may be more appropriate anyway. > 1 unit ordered for transfusion in the ED.,  Will monitor for any evidence of TACO. > Iron was checked just 3 weeks ago and was 12 with ferritin of 14.  Plan for Feraheme infusion outpatient per note from 12/27 (to be done through her nephrologist office). Patient reports getting this transfusion about a week ago. - Trend CBC   Hypertension - Continue home Cleda Daub, carvedilol - Lasix as above  NASH cirrhosis > Known history of cirrhosis, reportedly had episode of hepatic encephalopathy at the end of 2022.  Remains on lactulose and rifaximin. - Continue home spironolactone - Holding torsemide in favor of Lasix as above - Continue home carvedilol - Continue lactulose and rifaximin - Continue protein shake  Hyperlipidemia - Statin intolerance  Atrial fibrillation - Continue home carvedilol - Continue Eliquis  Hypothyroidism - Continue home Synthroid  GERD - Continue PPI  Obesity - Noted  Lymphedema - Noted  Breast cancer in 2018 > Status post lumpectomy, radiation, exemestane  then tamoxifen.  No current therapy. - Noted  DVT prophylaxis: Eliquis Code Status:   Full Family Communication:  Son updated by phone  Disposition Plan:   Patient is from:  Home  Anticipated DC to:  Home  Anticipated DC date:  1 to 3 days  Anticipated DC barriers: None  Consults called:  None Admission status:  Observation, progressive  Severity of Illness: The appropriate patient status for this patient is OBSERVATION. Observation status is judged to be reasonable and necessary in order to provide the required intensity of service to ensure the patient's safety. The patient's presenting symptoms, physical exam findings, and initial radiographic and laboratory data in the context of their medical condition is felt to place them at decreased risk for further clinical deterioration. Furthermore, it is anticipated that the patient will be medically stable for discharge from the hospital within 2 midnights of admission.    Synetta Fail MD [facility]  How to contact the [facility] Attending or Consulting provider 7A - 7P or covering provider during after hours 7P -7A, for this patient?   Check the care team in [facility] and look for a) attending/consulting [facility] provider listed and b) the  TR[facility] team listed Log into [email or web address] and use [facility] universal password to access. If you do not have the password, please contact the hospital operator. Locate the [facility] provider you are looking for under [facility] and page to a number that you can be directly reached. If you still have difficulty reaching the provider, please page the [facility] (Director on Call) for the Hospitalists listed on amion for assistance.  11/24/2023, 3:23 PM

## 2023-11-25 DIAGNOSIS — Z7901 Long term (current) use of anticoagulants: Secondary | ICD-10-CM | POA: Diagnosis not present

## 2023-11-25 DIAGNOSIS — I2781 Cor pulmonale (chronic): Secondary | ICD-10-CM | POA: Diagnosis present

## 2023-11-25 DIAGNOSIS — I130 Hypertensive heart and chronic kidney disease with heart failure and stage 1 through stage 4 chronic kidney disease, or unspecified chronic kidney disease: Secondary | ICD-10-CM | POA: Diagnosis present

## 2023-11-25 DIAGNOSIS — D631 Anemia in chronic kidney disease: Secondary | ICD-10-CM | POA: Diagnosis present

## 2023-11-25 DIAGNOSIS — Z79890 Hormone replacement therapy (postmenopausal): Secondary | ICD-10-CM | POA: Diagnosis not present

## 2023-11-25 DIAGNOSIS — J121 Respiratory syncytial virus pneumonia: Secondary | ICD-10-CM | POA: Diagnosis present

## 2023-11-25 DIAGNOSIS — N179 Acute kidney failure, unspecified: Secondary | ICD-10-CM | POA: Diagnosis present

## 2023-11-25 DIAGNOSIS — I5023 Acute on chronic systolic (congestive) heart failure: Secondary | ICD-10-CM | POA: Diagnosis present

## 2023-11-25 DIAGNOSIS — Z1152 Encounter for screening for COVID-19: Secondary | ICD-10-CM | POA: Diagnosis not present

## 2023-11-25 DIAGNOSIS — E039 Hypothyroidism, unspecified: Secondary | ICD-10-CM | POA: Diagnosis present

## 2023-11-25 DIAGNOSIS — E871 Hypo-osmolality and hyponatremia: Secondary | ICD-10-CM | POA: Diagnosis present

## 2023-11-25 DIAGNOSIS — E785 Hyperlipidemia, unspecified: Secondary | ICD-10-CM | POA: Diagnosis present

## 2023-11-25 DIAGNOSIS — I272 Pulmonary hypertension, unspecified: Secondary | ICD-10-CM | POA: Diagnosis present

## 2023-11-25 DIAGNOSIS — I482 Chronic atrial fibrillation, unspecified: Secondary | ICD-10-CM | POA: Diagnosis present

## 2023-11-25 DIAGNOSIS — N1832 Chronic kidney disease, stage 3b: Secondary | ICD-10-CM | POA: Diagnosis present

## 2023-11-25 DIAGNOSIS — I480 Paroxysmal atrial fibrillation: Secondary | ICD-10-CM | POA: Diagnosis present

## 2023-11-25 DIAGNOSIS — Z79899 Other long term (current) drug therapy: Secondary | ICD-10-CM | POA: Diagnosis not present

## 2023-11-25 DIAGNOSIS — J9621 Acute and chronic respiratory failure with hypoxia: Secondary | ICD-10-CM | POA: Diagnosis present

## 2023-11-25 DIAGNOSIS — D72819 Decreased white blood cell count, unspecified: Secondary | ICD-10-CM | POA: Diagnosis present

## 2023-11-25 DIAGNOSIS — K7581 Nonalcoholic steatohepatitis (NASH): Secondary | ICD-10-CM | POA: Diagnosis present

## 2023-11-25 DIAGNOSIS — I10 Essential (primary) hypertension: Secondary | ICD-10-CM | POA: Diagnosis not present

## 2023-11-25 DIAGNOSIS — Z9981 Dependence on supplemental oxygen: Secondary | ICD-10-CM | POA: Diagnosis not present

## 2023-11-25 DIAGNOSIS — K219 Gastro-esophageal reflux disease without esophagitis: Secondary | ICD-10-CM | POA: Diagnosis present

## 2023-11-25 DIAGNOSIS — K7460 Unspecified cirrhosis of liver: Secondary | ICD-10-CM | POA: Diagnosis present

## 2023-11-25 LAB — BLOOD CULTURE ID PANEL (REFLEXED) - BCID2

## 2023-11-25 LAB — COMPREHENSIVE METABOLIC PANEL
ALT: 13 U/L (ref 0–44)
AST: 20 U/L (ref 15–41)
Albumin: 3.1 g/dL — ABNORMAL LOW (ref 3.5–5.0)
Alkaline Phosphatase: 132 U/L — ABNORMAL HIGH (ref 38–126)
Anion gap: 9 (ref 5–15)
BUN: 20 mg/dL (ref 8–23)
CO2: 25 mmol/L (ref 22–32)
Calcium: 9 mg/dL (ref 8.9–10.3)
Chloride: 105 mmol/L (ref 98–111)
Creatinine, Ser: 1.55 mg/dL — ABNORMAL HIGH (ref 0.44–1.00)
GFR, Estimated: 34 mL/min — ABNORMAL LOW (ref >60)
Glucose, Bld: 172 mg/dL — ABNORMAL HIGH (ref 70–99)
Potassium: 3.6 mmol/L (ref 3.5–5.1)
Sodium: 139 mmol/L (ref 135–145)
Total Bilirubin: 1.1 mg/dL (ref 0.0–1.2)
Total Protein: 6.5 g/dL (ref 6.5–8.1)

## 2023-11-25 LAB — CBC
HCT: 26.9 % — ABNORMAL LOW (ref 36.0–46.0)
Hemoglobin: 7.6 g/dL — ABNORMAL LOW (ref 12.0–15.0)
MCH: 22.9 pg — ABNORMAL LOW (ref 26.0–34.0)
MCHC: 28.3 g/dL — ABNORMAL LOW (ref 30.0–36.0)
MCV: 81 fL (ref 80.0–100.0)
Platelets: 149 10*3/uL — ABNORMAL LOW (ref 150–400)
RBC: 3.32 MIL/uL — ABNORMAL LOW (ref 3.87–5.11)
RDW: 19.3 % — ABNORMAL HIGH (ref 11.5–15.5)
WBC: 2 10*3/uL — ABNORMAL LOW (ref 4.0–10.5)
nRBC: 1 % — ABNORMAL HIGH (ref 0.0–0.2)

## 2023-11-25 LAB — TYPE AND SCREEN
ABO/RH(D): O POS
Antibody Screen: NEGATIVE
Unit division: 0

## 2023-11-25 LAB — BPAM RBC
Blood Product Expiration Date: 202502032359
ISSUE DATE / TIME: 202501171412
Unit Type and Rh: 5100

## 2023-11-25 MED ORDER — SODIUM CHLORIDE 0.9 % IV SOLN
125.0000 mg | Freq: Every day | INTRAVENOUS | Status: AC
  Administered 2023-11-25 – 2023-11-27 (×3): 125 mg via INTRAVENOUS
  Filled 2023-11-25 (×4): qty 10

## 2023-11-25 MED ORDER — IPRATROPIUM-ALBUTEROL 0.5-2.5 (3) MG/3ML IN SOLN
3.0000 mL | Freq: Four times a day (QID) | RESPIRATORY_TRACT | Status: DC
Start: 2023-11-25 — End: 2023-12-01
  Administered 2023-11-25 – 2023-12-01 (×22): 3 mL via RESPIRATORY_TRACT
  Filled 2023-11-25 (×23): qty 3

## 2023-11-25 MED ORDER — GUAIFENESIN ER 600 MG PO TB12
600.0000 mg | ORAL_TABLET | Freq: Two times a day (BID) | ORAL | Status: DC
  Administered 2023-11-25 – 2023-11-29 (×9): 600 mg via ORAL
  Filled 2023-11-25 (×9): qty 1

## 2023-11-25 NOTE — Plan of Care (Signed)
Pt vital signs in normal range and hemoglobin now 7.6 up from 6.9

## 2023-11-25 NOTE — Progress Notes (Signed)
PROGRESS NOTE    Kharisma Fierman  WUJ:[id number] DOB: November 19, 1945 DOA: 11/24/2023 PCP: Tresa Garter, MD   [name] w hypertension, hyperlipidemia, atrial fibrillation, anemia, GERD, hypothyroidism, pacemaker, CKD 3B, obesity, NASH cirrhosis, CHF, breast cancer, lymphedema presented to ED with worsening shortness of breath and cough, symptoms started few days prior, EMS called for respiratory distress, placed on BiPAP.  In the ED hypertensive, tachypneic weaned off BiPAP to nasal cannula, creatinine 1.6, WBC 2.8, hemoglobin 6.9 from baseline of 7-8, BNP 1051, troponin 33, 39, RSV positive, FOBT negative, chest x-ray with interstitial markings concerning for edema versus pneumonia.   Subjective: -Some improvement, still with some dyspnea and cough  Assessment and Plan:  Acute respiratory failure with hypoxia Acute on chronic systolic CHF RSV + -Last echo 95/62 noted EF 40-45%, indeterminate diastolic function, moderately reduced RV  -Also has cirrhosis, chronic lymphedema as well  -Off BiPAP, on 6 L O2 this morning  -Continue IV Lasix, carvedilol, Aldactone  -Continue prednisone today, add DuoNebs, pulmonary toilet, Mucinex flutter valve  Liver cirrhosis/NASH -Lasix and Aldactone as noted above  Acute on chronic anemia, history of iron deficiency -MCV is normal now -Hemoglobin baseline around 7-8, presented at 6.9, likely dilutional component as well -Recent anemia panel 12/24 with severe iron deficiency, add IV iron   Hypertension -Stable, meds as above   NASH cirrhosis -Diuretics as above - Continue lactulose and rifaximin - Continue protein shake   Hyperlipidemia - Statin intolerance   Paroxysmal atrial fibrillation - Continue carvedilol, Eliquis   Hypothyroidism - Continue Synthroid   GERD - Continue PPI   Obesity - Noted   Lymphedema - Noted   Breast cancer in 2018 > Status post lumpectomy, radiation, exemestane then tamoxifen.  No current therapy. -  Noted   DVT prophylaxis:      Eliquis Code Status:              Full Family Communication:     Discussed with patient, no family at bedside  disposition Plan: Home likely 2 to 3 days  Consultants:    Procedures:   Antimicrobials:    Objective: Vitals:   11/24/23 2005 11/25/23 0008 11/25/23 0510 11/25/23 0758  BP: (!) 113/45 128/63 (!) 140/76 119/65  Pulse: 61 75 64   Resp: (!) 22 18 (!) 22   Temp: 98.3 F (36.8 C) 98.5 F (36.9 C) 98.1 F (36.7 C) 98.2 F (36.8 C)  TempSrc: Oral Oral Oral Oral  SpO2: 100% 100% 100%   Weight: 90.9 kg  90.9 kg   Height: 5\' 1"  (1.549 m)       Intake/Output Summary (Last 24 hours) at 11/25/2023 1009 Last data filed at 11/25/2023 0759 Gross per 24 hour  Intake 480 ml  Output 2800 ml  Net -2320 ml   Filed Weights   11/24/23 1019 11/24/23 2005 11/25/23 0510  Weight: 80 kg 90.9 kg 90.9 kg    Examination:  General exam: Chronically ill elderly female sitting up in bed, AAOx3 HEENT: Positive JVD CVS: S1-S2, regular rhythm Lungs: Scattered rhonchi, few basilar Rales Abdomen: Soft, nontender, bowel sounds present Extremities: Chronic lymphedema Skin: No rashes Psychiatry:  Mood & affect appropriate.     Data Reviewed:   CBC: Recent Labs  Lab 11/24/23 1044 11/24/23 1124 11/25/23 0225  WBC 2.8*  --  2.0*  HGB 6.9* 7.8* 7.6*  HCT 25.3* 23.0* 26.9*  MCV 83.2  --  81.0  PLT 176  --  149*   Basic Metabolic Panel: Recent  Labs  Lab 11/24/23 1044 11/24/23 1124 11/24/23 1521 11/25/23 0225  NA 138 140  --  139  K 3.8 3.8  --  3.6  CL 106  --   --  105  CO2 25  --   --  25  GLUCOSE 106*  --   --  172*  BUN 18  --   --  20  CREATININE 1.56*  --   --  1.55*  CALCIUM 9.4  --   --  9.0  MG  --   --  2.5*  --    GFR: Estimated Creatinine Clearance: 31.2 mL/min (A) (by C-G formula based on SCr of 1.55 mg/dL (H)). Liver Function Tests: Recent Labs  Lab 11/24/23 1044 11/25/23 0225  AST 21 20  ALT 13 13  ALKPHOS 134* 132*   BILITOT 1.2 1.1  PROT 6.7 6.5  ALBUMIN 3.3* 3.1*   No results for input(s): "LIPASE", "AMYLASE" in the last 168 hours. No results for input(s): "AMMONIA" in the last 168 hours. Coagulation Profile: No results for input(s): "INR", "PROTIME" in the last 168 hours. Cardiac Enzymes: No results for input(s): "CKTOTAL", "CKMB", "CKMBINDEX", "TROPONINI" in the last 168 hours. BNP (last 3 results) No results for input(s): "PROBNP" in the last 8760 hours. HbA1C: No results for input(s): "HGBA1C" in the last 72 hours. CBG: No results for input(s): "GLUCAP" in the last 168 hours. Lipid Profile: No results for input(s): "CHOL", "HDL", "LDLCALC", "TRIG", "CHOLHDL", "LDLDIRECT" in the last 72 hours. Thyroid Function Tests: No results for input(s): "TSH", "T4TOTAL", "FREET4", "T3FREE", "THYROIDAB" in the last 72 hours. Anemia Panel: No results for input(s): "VITAMINB12", "FOLATE", "FERRITIN", "TIBC", "IRON", "RETICCTPCT" in the last 72 hours. Urine analysis:    Component Value Date/Time   COLORURINE YELLOW 08/20/2023 2246   APPEARANCEUR TURBID (A) 08/20/2023 2246   LABSPEC 1.010 08/20/2023 2246   PHURINE 6.0 08/20/2023 2246   GLUCOSEU NEGATIVE 08/20/2023 2246   GLUCOSEU NEGATIVE 07/11/2017 1108   HGBUR SMALL (A) 08/20/2023 2246   BILIRUBINUR NEGATIVE 08/20/2023 2246   KETONESUR NEGATIVE 08/20/2023 2246   PROTEINUR 30 (A) 08/20/2023 2246   UROBILINOGEN 0.2 07/11/2017 1108   NITRITE NEGATIVE 08/20/2023 2246   LEUKOCYTESUR LARGE (A) 08/20/2023 2246   Sepsis Labs: @LABRCNTIP (procalcitonin:4,lacticidven:4)  ) Recent Results (from the past 240 hours)  Blood culture (routine x 2)     Status: None (Preliminary result)   Collection Time: 11/24/23 10:44 AM   Specimen: BLOOD  Result Value Ref Range Status   Specimen Description BLOOD BLOOD LEFT HAND  Final   Special Requests   Final    BOTTLES DRAWN AEROBIC AND ANAEROBIC Blood Culture results may not be optimal due to an inadequate volume  of blood received in culture bottles   Culture   Final    NO GROWTH < 24 HOURS Performed at [facility], [address] [place], [place] [address]    Report Status PENDING  Incomplete  Resp panel by RT-PCR (RSV, Flu A&B, Covid) Anterior Nasal Swab     Status: Abnormal   Collection Time: 11/24/23 10:44 AM   Specimen: Anterior Nasal Swab  Result Value Ref Range Status   SARS Coronavirus 2 by RT PCR NEGATIVE NEGATIVE Final   Influenza A by PCR NEGATIVE NEGATIVE Final   Influenza B by PCR NEGATIVE NEGATIVE Final    Comment: (NOTE) The Xpert Xpress SARS-CoV-2/FLU/RSV plus assay is intended as an aid in the diagnosis of influenza from Nasopharyngeal swab specimens and should not  be used as a sole basis for treatment. Nasal washings and aspirates are unacceptable for Xpert Xpress SARS-CoV-2/FLU/RSV testing.  Fact Sheet for Patients: BloggerCourse.com  Fact Sheet for Healthcare Providers: SeriousBroker.it  This test is not yet approved or cleared by the [place] FDA and has been authorized for detection and/or diagnosis of SARS-CoV-2 by FDA under an Emergency Use Authorization (EUA). This EUA will remain in effect (meaning this test can be used) for the duration of the COVID-19 declaration under Section 564(b)(1) of the Act, 21 U.S.C. section 360bbb-3(b)(1), unless the authorization is terminated or revoked.     Resp Syncytial Virus by PCR POSITIVE (A) NEGATIVE Final    Comment: (NOTE) Fact Sheet for Patients: BloggerCourse.com  Fact Sheet for Healthcare Providers: SeriousBroker.it  This test is not yet approved or cleared by the [place] FDA and has been authorized for detection and/or diagnosis of SARS-CoV-2 by FDA under an Emergency Use Authorization (EUA). This EUA will remain in effect (meaning this test can be used) for the duration of the COVID-19  declaration under Section 564(b)(1) of the Act, 21 U.S.C. section 360bbb-3(b)(1), unless the authorization is terminated or revoked.  Performed at [facility], [address] [place], [place] [address]   Blood culture (routine x 2)     Status: None (Preliminary result)   Collection Time: 11/24/23 12:41 PM   Specimen: BLOOD  Result Value Ref Range Status   Specimen Description BLOOD BLOOD LEFT HAND  Final   Special Requests   Final    AEROBIC BOTTLE ONLY Blood Culture results may not be optimal due to an inadequate volume of blood received in culture bottles   Culture   Final    NO GROWTH < 24 HOURS Performed at [facility], [address], [place], [place] [address]    Report Status PENDING  Incomplete     Radiology Studies: DG Chest Portable 1 View Result Date: 11/24/2023 CLINICAL DATA:  Shortness of breath.  COPD. EXAM: PORTABLE CHEST 1 VIEW COMPARISON:  08/20/2023. FINDINGS: Low lung volume. Redemonstration of diffuse increased interstitial markings, grossly similar to the prior study. Findings are nonspecific and differential diagnosis include underlying interstitial lung disease, pulmonary edema or multilobar pneumonia. Correlate clinically. No acute dense consolidation or lung collapse. Bilateral costophrenic angles are clear. Stable moderately enlarged cardio-mediastinal silhouette. There is a left sided 2-lead pacemaker. No acute osseous abnormalities. The soft tissues are within normal limits. IMPRESSION: *Redemonstration of diffuse increased interstitial markings, as discussed above. Electronically Signed   By: Jules Schick M.D.   On: 11/24/2023 13:25     Scheduled Meds:  allopurinol  50 mg Oral Daily   apixaban  5 mg Oral BID   carvedilol  12.5 mg Oral BID WC   feeding supplement  30 mL Oral BID   furosemide  80 mg Intravenous BID   lactulose  10 g Oral TID   levothyroxine  50 mcg Oral QAC breakfast   pantoprazole  40 mg Oral Daily   predniSONE  40 mg  Oral Q breakfast   rifaximin  550 mg Oral BID   sodium chloride flush  3 mL Intravenous Q12H   spironolactone  25 mg Oral Daily   Continuous Infusions:   LOS: 0 days    Time spent:    Zannie Cove, MD [facility]   11/25/2023, 10:09 AM

## 2023-11-25 NOTE — Care Management Obs Status (Signed)
MEDICARE OBSERVATION STATUS NOTIFICATION   Patient Details  Name: Katelyn Jackson MRN: [id number] Date of Birth: 15-Jul-1946   Medicare Observation Status Notification Given:  Yes    Lawerance Sabal, RN 11/25/2023, 3:09 PM

## 2023-11-25 NOTE — Progress Notes (Signed)
PHARMACY - PHYSICIAN COMMUNICATION CRITICAL VALUE ALERT - BLOOD CULTURE IDENTIFICATION (BCID)  Katelyn Jackson is an 78 y.o. female who presented to [facility] on 11/24/2023 with a chief complaint of SOB  Assessment:  Admitted with CHF exacerbation, RSV  Name of physician (or Provider) Contacted: Dr. Jomarie Longs  Current antibiotics: None - none needed, likely contaminant.  Changes to prescribed antibiotics recommended:  Recommendations accepted by provider  Results for orders placed or performed during the hospital encounter of 11/24/23  Blood Culture ID Panel (Reflexed) (Collected: 11/24/2023 12:41 PM)  Result Value Ref Range   Enterococcus faecalis NOT DETECTED NOT DETECTED   Enterococcus Faecium NOT DETECTED NOT DETECTED   Listeria monocytogenes NOT DETECTED NOT DETECTED   Staphylococcus species DETECTED (A) NOT DETECTED   Staphylococcus aureus (BCID) NOT DETECTED NOT DETECTED   Staphylococcus epidermidis NOT DETECTED NOT DETECTED   Staphylococcus lugdunensis NOT DETECTED NOT DETECTED   Streptococcus species NOT DETECTED NOT DETECTED   Streptococcus agalactiae NOT DETECTED NOT DETECTED   Streptococcus pneumoniae NOT DETECTED NOT DETECTED   Streptococcus pyogenes NOT DETECTED NOT DETECTED   A.calcoaceticus-baumannii NOT DETECTED NOT DETECTED   Bacteroides fragilis NOT DETECTED NOT DETECTED   Enterobacterales NOT DETECTED NOT DETECTED   Enterobacter cloacae complex NOT DETECTED NOT DETECTED   Escherichia coli NOT DETECTED NOT DETECTED   Klebsiella aerogenes NOT DETECTED NOT DETECTED   Klebsiella oxytoca NOT DETECTED NOT DETECTED   Klebsiella pneumoniae NOT DETECTED NOT DETECTED   Proteus species NOT DETECTED NOT DETECTED   Salmonella species NOT DETECTED NOT DETECTED   Serratia marcescens NOT DETECTED NOT DETECTED   Haemophilus influenzae NOT DETECTED NOT DETECTED   Neisseria meningitidis NOT DETECTED NOT DETECTED   Pseudomonas aeruginosa NOT DETECTED NOT DETECTED    Stenotrophomonas maltophilia NOT DETECTED NOT DETECTED   Candida albicans NOT DETECTED NOT DETECTED   Candida auris NOT DETECTED NOT DETECTED   Candida glabrata NOT DETECTED NOT DETECTED   Candida krusei NOT DETECTED NOT DETECTED   Candida parapsilosis NOT DETECTED NOT DETECTED   Candida tropicalis NOT DETECTED NOT DETECTED   Cryptococcus neoformans/gattii NOT DETECTED NOT DETECTED    Elwin Sleight 11/25/2023  3:15 PM

## 2023-11-26 DIAGNOSIS — I5023 Acute on chronic systolic (congestive) heart failure: Secondary | ICD-10-CM | POA: Diagnosis not present

## 2023-11-26 LAB — CBC
HCT: 27.3 % — ABNORMAL LOW (ref 36.0–46.0)
Hemoglobin: 7.6 g/dL — ABNORMAL LOW (ref 12.0–15.0)
MCH: 22.6 pg — ABNORMAL LOW (ref 26.0–34.0)
MCHC: 27.8 g/dL — ABNORMAL LOW (ref 30.0–36.0)
MCV: 81 fL (ref 80.0–100.0)
Platelets: 149 10*3/uL — ABNORMAL LOW (ref 150–400)
RBC: 3.37 MIL/uL — ABNORMAL LOW (ref 3.87–5.11)
RDW: 19.8 % — ABNORMAL HIGH (ref 11.5–15.5)
WBC: 3.2 10*3/uL — ABNORMAL LOW (ref 4.0–10.5)
nRBC: 0.6 % — ABNORMAL HIGH (ref 0.0–0.2)

## 2023-11-26 LAB — BASIC METABOLIC PANEL
Anion gap: 8 (ref 5–15)
BUN: 24 mg/dL — ABNORMAL HIGH (ref 8–23)
CO2: 28 mmol/L (ref 22–32)
Calcium: 8.7 mg/dL — ABNORMAL LOW (ref 8.9–10.3)
Chloride: 100 mmol/L (ref 98–111)
Creatinine, Ser: 1.51 mg/dL — ABNORMAL HIGH (ref 0.44–1.00)
GFR, Estimated: 35 mL/min — ABNORMAL LOW (ref >60)
Glucose, Bld: 117 mg/dL — ABNORMAL HIGH (ref 70–99)
Potassium: 3.1 mmol/L — ABNORMAL LOW (ref 3.5–5.1)
Sodium: 136 mmol/L (ref 135–145)

## 2023-11-26 MED ORDER — LACTULOSE 10 GM/15ML PO SOLN
10.0000 g | Freq: Two times a day (BID) | ORAL | Status: DC
Start: 2023-11-27 — End: 2023-12-01
  Administered 2023-11-27 – 2023-12-01 (×9): 10 g via ORAL
  Filled 2023-11-26 (×9): qty 15

## 2023-11-26 MED ORDER — POTASSIUM CHLORIDE CRYS ER 20 MEQ PO TBCR
80.0000 meq | EXTENDED_RELEASE_TABLET | Freq: Once | ORAL | Status: AC
  Administered 2023-11-26: 80 meq via ORAL
  Filled 2023-11-26: qty 4

## 2023-11-26 NOTE — Progress Notes (Addendum)
PROGRESS NOTE    Katelyn Jackson  ONG:[id number] DOB: 1946/02/10 DOA: 11/24/2023 PCP: Tresa Garter, MD   [name] w hypertension, hyperlipidemia, atrial fibrillation, anemia, GERD, hypothyroidism, pacemaker, CKD 3B, obesity, NASH cirrhosis, CHF, breast cancer, lymphedema presented to ED with worsening shortness of breath and cough, symptoms started few days prior, EMS called for respiratory distress, placed on BiPAP.  In the ED hypertensive, tachypneic weaned off BiPAP to nasal cannula, creatinine 1.6, WBC 2.8, hemoglobin 6.9 from baseline of 7-8, BNP 1051, troponin 33, 39, RSV positive, FOBT negative, chest x-ray with interstitial markings concerning for edema versus pneumonia.   Subjective: -Feels better overall, still with some dyspnea and cough  Assessment and Plan:  Acute respiratory failure with hypoxia Acute on chronic systolic CHF RSV + -Last echo 44/01 noted EF 40-45%, indeterminate diastolic function, moderately reduced RV  -Also has cirrhosis, chronic lymphedema as well  -Off BiPAP -4 L negative, weight down 7 LB, continue IV Lasix 1 more day, continue Aldactone and carvedilol -Pulmonary toilet, prednisone today, DuoNebs, Mucinex flutter valve -PT eval  Liver cirrhosis/NASH -Lasix and Aldactone as noted above -On lactulose and rifaximin, decrease lactulose dose  Acute on chronic anemia, history of iron deficiency -MCV is normal now -Hemoglobin baseline around 7-8, presented at 6.9, likely dilutional component as well -Recent anemia panel 12/24 with severe iron deficiency, continue IV iron, day 2   Hypertension -Stable, meds as above   NASH cirrhosis -Diuretics as above - Continue lactulose and rifaximin - Continue protein shake   Hyperlipidemia - Statin intolerance   Paroxysmal atrial fibrillation - Continue carvedilol, Eliquis   Hypothyroidism - Continue Synthroid   GERD - Continue PPI   Obesity - Noted   Lymphedema - Noted   Breast cancer in  2018 > Status post lumpectomy, radiation, exemestane then tamoxifen.  No current therapy. - Noted   DVT prophylaxis:      Eliquis Code Status:              Full Family Communication:     Discussed with patient, no family at bedside  disposition Plan: Home likely 2 to 3 days  Consultants:    Procedures:   Antimicrobials:    Objective: Vitals:   11/26/23 0536 11/26/23 0759 11/26/23 0800 11/26/23 0848  BP:  121/63    Pulse:  68    Resp:  15 20 20   Temp:  98.8 F (37.1 C)    TempSrc:  Oral    SpO2:  98%  100%  Weight: (P) 87.8 kg     Height:        Intake/Output Summary (Last 24 hours) at 11/26/2023 1036 Last data filed at 11/26/2023 0759 Gross per 24 hour  Intake 571.21 ml  Output 2100 ml  Net -1528.79 ml   Filed Weights   11/25/23 0510 11/26/23 0500 11/26/23 0536  Weight: 90.9 kg 87.7 kg (P) 87.8 kg    Examination:  General exam: Chronically ill elderly female sitting up in bed, AAOx3 HEENT: Positive JVD CVS: S1-S2, regular rhythm Lungs: Diffuse scattered rhonchi and few Rales Abdomen: Soft, nontender, bowel sounds present Extremities: Chronic lymphedema Skin: No rashes Psychiatry:  Mood & affect appropriate.     Data Reviewed:   CBC: Recent Labs  Lab 11/24/23 1044 11/24/23 1124 11/25/23 0225 11/26/23 0229  WBC 2.8*  --  2.0* 3.2*  HGB 6.9* 7.8* 7.6* 7.6*  HCT 25.3* 23.0* 26.9* 27.3*  MCV 83.2  --  81.0 81.0  PLT 176  --  149*  149*   Basic Metabolic Panel: Recent Labs  Lab 11/24/23 1044 11/24/23 1124 11/24/23 1521 11/25/23 0225 11/26/23 0229  NA 138 140  --  139 136  K 3.8 3.8  --  3.6 3.1*  CL 106  --   --  105 100  CO2 25  --   --  25 28  GLUCOSE 106*  --   --  172* 117*  BUN 18  --   --  20 24*  CREATININE 1.56*  --   --  1.55* 1.51*  CALCIUM 9.4  --   --  9.0 8.7*  MG  --   --  2.5*  --   --    GFR: Estimated Creatinine Clearance: 31.4 mL/min (A) (by C-G formula based on SCr of 1.51 mg/dL (H)). Liver Function Tests: Recent  Labs  Lab 11/24/23 1044 11/25/23 0225  AST 21 20  ALT 13 13  ALKPHOS 134* 132*  BILITOT 1.2 1.1  PROT 6.7 6.5  ALBUMIN 3.3* 3.1*   No results for input(s): "LIPASE", "AMYLASE" in the last 168 hours. No results for input(s): "AMMONIA" in the last 168 hours. Coagulation Profile: No results for input(s): "INR", "PROTIME" in the last 168 hours. Cardiac Enzymes: No results for input(s): "CKTOTAL", "CKMB", "CKMBINDEX", "TROPONINI" in the last 168 hours. BNP (last 3 results) No results for input(s): "PROBNP" in the last 8760 hours. HbA1C: No results for input(s): "HGBA1C" in the last 72 hours. CBG: No results for input(s): "GLUCAP" in the last 168 hours. Lipid Profile: No results for input(s): "CHOL", "HDL", "LDLCALC", "TRIG", "CHOLHDL", "LDLDIRECT" in the last 72 hours. Thyroid Function Tests: No results for input(s): "TSH", "T4TOTAL", "FREET4", "T3FREE", "THYROIDAB" in the last 72 hours. Anemia Panel: No results for input(s): "VITAMINB12", "FOLATE", "FERRITIN", "TIBC", "IRON", "RETICCTPCT" in the last 72 hours. Urine analysis:    Component Value Date/Time   COLORURINE YELLOW 08/20/2023 2246   APPEARANCEUR TURBID (A) 08/20/2023 2246   LABSPEC 1.010 08/20/2023 2246   PHURINE 6.0 08/20/2023 2246   GLUCOSEU NEGATIVE 08/20/2023 2246   GLUCOSEU NEGATIVE 07/11/2017 1108   HGBUR SMALL (A) 08/20/2023 2246   BILIRUBINUR NEGATIVE 08/20/2023 2246   KETONESUR NEGATIVE 08/20/2023 2246   PROTEINUR 30 (A) 08/20/2023 2246   UROBILINOGEN 0.2 07/11/2017 1108   NITRITE NEGATIVE 08/20/2023 2246   LEUKOCYTESUR LARGE (A) 08/20/2023 2246   Sepsis Labs: @LABRCNTIP (procalcitonin:4,lacticidven:4)  ) Recent Results (from the past 240 hours)  Blood culture (routine x 2)     Status: None (Preliminary result)   Collection Time: 11/24/23 10:44 AM   Specimen: BLOOD LEFT HAND  Result Value Ref Range Status   Specimen Description BLOOD LEFT HAND  Final   Special Requests   Final    BOTTLES DRAWN  AEROBIC AND ANAEROBIC Blood Culture results may not be optimal due to an inadequate volume of blood received in culture bottles   Culture   Final    NO GROWTH 2 DAYS Performed at [facility], [address], [place], [place] [address]    Report Status PENDING  Incomplete  Resp panel by RT-PCR (RSV, Flu A&B, Covid) Anterior Nasal Swab     Status: Abnormal   Collection Time: 11/24/23 10:44 AM   Specimen: Anterior Nasal Swab  Result Value Ref Range Status   SARS Coronavirus 2 by RT PCR NEGATIVE NEGATIVE Final   Influenza A by PCR NEGATIVE NEGATIVE Final   Influenza B by PCR NEGATIVE NEGATIVE Final    Comment: (NOTE) The Xpert Xpress  SARS-CoV-2/FLU/RSV plus assay is intended as an aid in the diagnosis of influenza from Nasopharyngeal swab specimens and should not be used as a sole basis for treatment. Nasal washings and aspirates are unacceptable for Xpert Xpress SARS-CoV-2/FLU/RSV testing.  Fact Sheet for Patients: BloggerCourse.com  Fact Sheet for Healthcare Providers: SeriousBroker.it  This test is not yet approved or cleared by the [place] FDA and has been authorized for detection and/or diagnosis of SARS-CoV-2 by FDA under an Emergency Use Authorization (EUA). This EUA will remain in effect (meaning this test can be used) for the duration of the COVID-19 declaration under Section 564(b)(1) of the Act, 21 U.S.C. section 360bbb-3(b)(1), unless the authorization is terminated or revoked.     Resp Syncytial Virus by PCR POSITIVE (A) NEGATIVE Final    Comment: (NOTE) Fact Sheet for Patients: BloggerCourse.com  Fact Sheet for Healthcare Providers: SeriousBroker.it  This test is not yet approved or cleared by the [place] FDA and has been authorized for detection and/or diagnosis of SARS-CoV-2 by FDA under an Emergency Use Authorization (EUA). This EUA will  remain in effect (meaning this test can be used) for the duration of the COVID-19 declaration under Section 564(b)(1) of the Act, 21 U.S.C. section 360bbb-3(b)(1), unless the authorization is terminated or revoked.  Performed at [facility], [address], [place], [place] [address]   Blood culture (routine x 2)     Status: None (Preliminary result)   Collection Time: 11/24/23 12:41 PM   Specimen: BLOOD LEFT HAND  Result Value Ref Range Status   Specimen Description BLOOD LEFT HAND  Final   Special Requests   Final    AEROBIC BOTTLE ONLY Blood Culture results may not be optimal due to an inadequate volume of blood received in culture bottles   Culture  Setup Time   Final    GRAM POSITIVE COCCI AEROBIC BOTTLE ONLY CRITICAL RESULT CALLED TO, READ BACK BY AND VERIFIED WITH: PHARMD JESSICA MILLEN [id number] AT 1512 BY EC Performed at [facility], [address], [place], [place] [address]    Culture GRAM POSITIVE COCCI  Final   Report Status PENDING  Incomplete  Blood Culture ID Panel (Reflexed)     Status: Abnormal   Collection Time: 11/24/23 12:41 PM  Result Value Ref Range Status   Enterococcus faecalis NOT DETECTED NOT DETECTED Final   Enterococcus Faecium NOT DETECTED NOT DETECTED Final   Listeria monocytogenes NOT DETECTED NOT DETECTED Final   Staphylococcus species DETECTED (A) NOT DETECTED Final    Comment: CRITICAL RESULT CALLED TO, READ BACK BY AND VERIFIED WITH: PHARMD JESSICA MILLEN [id number] AT 1512 BY EC    Staphylococcus aureus (BCID) NOT DETECTED NOT DETECTED Final   Staphylococcus epidermidis NOT DETECTED NOT DETECTED Final   Staphylococcus lugdunensis NOT DETECTED NOT DETECTED Final   Streptococcus species NOT DETECTED NOT DETECTED Final   Streptococcus agalactiae NOT DETECTED NOT DETECTED Final   Streptococcus pneumoniae NOT DETECTED NOT DETECTED Final   Streptococcus pyogenes NOT DETECTED NOT DETECTED Final   A.calcoaceticus-baumannii NOT DETECTED  NOT DETECTED Final   Bacteroides fragilis NOT DETECTED NOT DETECTED Final   Enterobacterales NOT DETECTED NOT DETECTED Final   Enterobacter cloacae complex NOT DETECTED NOT DETECTED Final   Escherichia coli NOT DETECTED NOT DETECTED Final   Klebsiella aerogenes NOT DETECTED NOT DETECTED Final   Klebsiella oxytoca NOT DETECTED NOT DETECTED Final   Klebsiella pneumoniae NOT DETECTED NOT DETECTED Final   Proteus species NOT DETECTED NOT DETECTED  Final   Salmonella species NOT DETECTED NOT DETECTED Final   Serratia marcescens NOT DETECTED NOT DETECTED Final   Haemophilus influenzae NOT DETECTED NOT DETECTED Final   Neisseria meningitidis NOT DETECTED NOT DETECTED Final   Pseudomonas aeruginosa NOT DETECTED NOT DETECTED Final   Stenotrophomonas maltophilia NOT DETECTED NOT DETECTED Final   Candida albicans NOT DETECTED NOT DETECTED Final   Candida auris NOT DETECTED NOT DETECTED Final   Candida glabrata NOT DETECTED NOT DETECTED Final   Candida krusei NOT DETECTED NOT DETECTED Final   Candida parapsilosis NOT DETECTED NOT DETECTED Final   Candida tropicalis NOT DETECTED NOT DETECTED Final   Cryptococcus neoformans/gattii NOT DETECTED NOT DETECTED Final    Comment: Performed at [facility], [address] [place], [place] [address]     Radiology Studies: DG Chest Portable 1 View Result Date: 11/24/2023 CLINICAL DATA:  Shortness of breath.  COPD. EXAM: PORTABLE CHEST 1 VIEW COMPARISON:  08/20/2023. FINDINGS: Low lung volume. Redemonstration of diffuse increased interstitial markings, grossly similar to the prior study. Findings are nonspecific and differential diagnosis include underlying interstitial lung disease, pulmonary edema or multilobar pneumonia. Correlate clinically. No acute dense consolidation or lung collapse. Bilateral costophrenic angles are clear. Stable moderately enlarged cardio-mediastinal silhouette. There is a left sided 2-lead pacemaker. No acute osseous  abnormalities. The soft tissues are within normal limits. IMPRESSION: *Redemonstration of diffuse increased interstitial markings, as discussed above. Electronically Signed   By: Jules Schick M.D.   On: 11/24/2023 13:25     Scheduled Meds:  allopurinol  50 mg Oral Daily   apixaban  5 mg Oral BID   carvedilol  12.5 mg Oral BID WC   feeding supplement  30 mL Oral BID   furosemide  80 mg Intravenous BID   guaiFENesin  600 mg Oral BID   ipratropium-albuterol  3 mL Nebulization QID   lactulose  10 g Oral TID   levothyroxine  50 mcg Oral QAC breakfast   pantoprazole  40 mg Oral Daily   predniSONE  40 mg Oral Q breakfast   rifaximin  550 mg Oral BID   sodium chloride flush  3 mL Intravenous Q12H   spironolactone  25 mg Oral Daily   Continuous Infusions:  ferric gluconate (FERRLECIT) IVPB Stopped (11/25/23 1454)     LOS: 1 day    Time spent:    Zannie Cove, MD [facility]   11/26/2023, 10:36 AM

## 2023-11-26 NOTE — Progress Notes (Signed)
Pt in no distress requiring bipap at this time.  VS WNR on 4L [place].  Rt will continue to monitor.

## 2023-11-27 ENCOUNTER — Encounter (HOSPITAL_COMMUNITY): Payer: Medicare HMO

## 2023-11-27 DIAGNOSIS — I5023 Acute on chronic systolic (congestive) heart failure: Secondary | ICD-10-CM | POA: Diagnosis not present

## 2023-11-27 LAB — BASIC METABOLIC PANEL
Anion gap: 7 (ref 5–15)
BUN: 33 mg/dL — ABNORMAL HIGH (ref 8–23)
CO2: 30 mmol/L (ref 22–32)
Calcium: 9 mg/dL (ref 8.9–10.3)
Chloride: 99 mmol/L (ref 98–111)
Creatinine, Ser: 1.8 mg/dL — ABNORMAL HIGH (ref 0.44–1.00)
GFR, Estimated: 29 mL/min — ABNORMAL LOW (ref >60)
Glucose, Bld: 95 mg/dL (ref 70–99)
Potassium: 4.3 mmol/L (ref 3.5–5.1)
Sodium: 136 mmol/L (ref 135–145)

## 2023-11-27 LAB — CULTURE, BLOOD (ROUTINE X 2)

## 2023-11-27 NOTE — Evaluation (Signed)
Physical Therapy Evaluation Patient Details Name: Katelyn Jackson MRN: [id number] DOB: 03/04/1946 Today's Date: 11/27/2023  History of Present Illness  Pt is a 78 y.o. female presenting 1/17 from home with shortness of breath. CXR concerning for edema vs PNA. Pt positive for RSV.  PMH significant for afib on Eliquis s/p PPM, hypothyroidism, pacemaker, CKD IIIb, NASH cirrhosis, HTN, HLD, CHF, breast cancer, anemia, lymphedema.  Clinical Impression  Pt admitted with above diagnosis. Pt was able to stand and ambulate with CGA and occasional cues.  Son assists pt prn at home.  She also uses wheelchair at times at home. Agree that she is appropriate for HHPT.   Did not desaturate below 90% on RA however DOE significant at end of walk therefore replaced O2 at 1LO2.  Pt currently with functional limitations due to the deficits listed below (see PT Problem List). Pt will benefit from acute skilled PT to increase their independence and safety with mobility to allow discharge.           If plan is discharge home, recommend the following: A little help with walking and/or transfers;A little help with bathing/dressing/bathroom;Assistance with cooking/housework;Assist for transportation;Help with stairs or ramp for entrance   Can travel by private vehicle        Equipment Recommendations None recommended by PT;Other (comment) (may need O2?)  Recommendations for Other Services       Functional Status Assessment Patient has had a recent decline in their functional status and demonstrates the ability to make significant improvements in function in a reasonable and predictable amount of time.     Precautions / Restrictions Precautions Precautions: Fall;Other (comment) Precaution Comments: Pain from L clavicular and patellar fx last year; DROPLET Restrictions Weight Bearing Restrictions Per Provider Order: No      Mobility  Bed Mobility Overal bed mobility: Needs Assistance Bed Mobility: Sit to  Supine       Sit to supine: Mod assist, HOB elevated   General bed mobility comments: Pt needed a little assist to Les back into bed    Transfers Overall transfer level: Needs assistance Equipment used: Rolling walker (2 wheels) Transfers: Sit to/from Stand, Bed to chair/wheelchair/BSC Sit to Stand: Contact guard assist   Step pivot transfers: Contact guard assist       General transfer comment: CGA for safety with sit to stand.    Ambulation/Gait Ambulation/Gait assistance: Contact guard assist Gait Distance (Feet): 10 Feet Assistive device: Rolling walker (2 wheels) Gait Pattern/deviations: Decreased step length - right, Decreased step length - left, Shuffle, Drifts right/left, Trunk flexed   Gait velocity interpretation: <1.31 ft/sec, indicative of household ambulator   General Gait Details: Pt was able to ambulate around the bed with CGA and no LOB.  Stairs            Wheelchair Mobility     Tilt Bed    Modified Rankin (Stroke Patients Only)       Balance Overall balance assessment: Mild deficits observed, not formally tested                                           Pertinent Vitals/Pain Pain Assessment Pain Assessment: Faces Faces Pain Scale: Hurts little more Pain Location: L knee Pain Descriptors / Indicators: Discomfort, Aching Pain Intervention(s): Limited activity within patient's tolerance, Monitored during session, Repositioned    Home Living Family/patient expects to be discharged  to:: Private residence Living Arrangements: Children (son) Available Help at Discharge: Family;Available PRN/intermittently Type of Home: House Home Access: Ramped entrance       Home Layout: Multi-level;Able to live on main level with bedroom/bathroom Home Equipment: Tub bench;Wheelchair - Forensic psychologist (2 wheels);Rollator (4 wheels) Additional Comments: was home from recent rehab stay just a few days before R sided weakness  ("I tell my leg to move and it doesn't"); Sounds like she uses her wheelchair mostly for mobility, and uses RW for bathroom access; WBAT LUE and LLE now per Ortho PA    Prior Function Prior Level of Function : Needs assist             Mobility Comments: Uses wheelchair mostly since home for recent rehab stay at SNF; Walking very short distances with RW, Doorframe of Bathroom to shower/toilet with assist. ADLs Comments: States her son and DIL assist with bathing and dressing.  DIL completes meals and iADL     Extremity/Trunk Assessment   Upper Extremity Assessment Upper Extremity Assessment: Defer to OT evaluation    Lower Extremity Assessment Lower Extremity Assessment: Overall WFL for tasks assessed    Cervical / Trunk Assessment Cervical / Trunk Assessment: Kyphotic (forward rounding of shoulders)  Communication   Communication Communication: No apparent difficulties;Other (comment) (speaks russian, but [place] is good for communication of basic needs and following commands.) Cueing Techniques: Verbal cues;Tactile cues  Cognition Arousal: Alert Behavior During Therapy: WFL for tasks assessed/performed Overall Cognitive Status: Within Functional Limits for tasks assessed                                 General Comments: follows all one step commands, prefers russian language for history taking and more in depth conversations but able to communicate in [place] throughout session. son provided home set-up via phone as well as chatted with pt and PT about session        General Comments General comments (skin integrity, edema, etc.): VSS on RA with sats >90% however wheezing and DOE 3/4 at end of walk. REplaced 1LO2.    Exercises     Assessment/Plan    PT Assessment Patient needs continued PT services  PT Problem List Decreased activity tolerance;Decreased balance;Decreased mobility;Decreased knowledge of use of DME;Decreased safety awareness       PT  Treatment Interventions DME instruction;Gait training;Functional mobility training;Therapeutic activities;Therapeutic exercise;Balance training;Patient/family education    PT Goals (Current goals can be found in the Care Plan section)  Acute Rehab PT Goals Patient Stated Goal: to go home PT Goal Formulation: With patient Time For Goal Achievement: 12/11/23 Potential to Achieve Goals: Good    Frequency Min 1X/week     Co-evaluation               AM-PAC PT "6 Clicks" Mobility  Outcome Measure Help needed turning from your back to your side while in a flat bed without using bedrails?: None Help needed moving from lying on your back to sitting on the side of a flat bed without using bedrails?: A Little Help needed moving to and from a bed to a chair (including a wheelchair)?: A Little Help needed standing up from a chair using your arms (e.g., wheelchair or bedside chair)?: A Little Help needed to walk in hospital room?: A Little Help needed climbing 3-5 steps with a railing? : A Lot 6 Click Score: 18    End of Session Equipment  Utilized During Treatment: Gait belt;Oxygen Activity Tolerance: Patient limited by fatigue Patient left: with call bell/phone within reach;in bed;with bed alarm set Nurse Communication: Mobility status PT Visit Diagnosis: Unsteadiness on feet (R26.81);Muscle weakness (generalized) (M62.81)    Time: 9147-8295 PT Time Calculation (min) (ACUTE ONLY): 24 min   Charges:   PT Evaluation $PT Eval Moderate Complexity: 1 Mod PT Treatments $Gait Training: 8-22 mins PT General Charges $$ ACUTE PT VISIT: 1 Visit         Godfrey Tritschler M,PT Acute Rehab Services [phone]   Bevelyn Buckles 11/27/2023, 1:02 PM

## 2023-11-27 NOTE — Plan of Care (Signed)

## 2023-11-27 NOTE — Progress Notes (Signed)
PROGRESS NOTE    Katelyn Jackson  ZOX:[id number] DOB: Feb 27, 1946 DOA: 11/24/2023 PCP: Tresa Garter, MD   [name] w hypertension, hyperlipidemia, atrial fibrillation, anemia, GERD, hypothyroidism, pacemaker, CKD 3B, obesity, NASH cirrhosis, CHF, breast cancer, lymphedema presented to ED with worsening shortness of breath and cough, symptoms started few days prior, EMS called for respiratory distress, placed on BiPAP.  In the ED hypertensive, tachypneic weaned off BiPAP to nasal cannula, creatinine 1.6, WBC 2.8, hemoglobin 6.9 from baseline of 7-8, BNP 1051, troponin 33, 39, RSV positive, FOBT negative, chest x-ray with interstitial markings concerning for edema versus pneumonia.   Subjective: -Feels better overall, breathing has improved, still coughing a lot  Assessment and Plan:  Acute respiratory failure with hypoxia Acute on chronic systolic CHF RSV + -Last echo 81/19 noted EF 40-45%, indeterminate diastolic function, moderately reduced RV  -Also has cirrhosis, chronic lymphedema as well  -Off BiPAP - diuresed well with IV Lasix, creatinine bumped to 1.8 today, hold further diuretics today, switch to oral torsemide tomorrow , continue Aldactone and carvedilol -Pulmonary toilet, prednisone start taper from tomorrow,, DuoNebs, Mucinex flutter valve -PT eval, discharge planning  Liver cirrhosis/NASH -Lasix and Aldactone as noted above -On lactulose and rifaximin, decreased lactulose dose  Acute on chronic anemia, history of iron deficiency -MCV is normal now -Hemoglobin baseline around 7-8, presented at 6.9, likely dilutional component as well -Recent anemia panel 12/24 with severe iron deficiency, continue IV iron, day 2   Hypertension -Stable, meds as above   NASH cirrhosis -Diuretics as above - Continue lactulose and rifaximin - Continue protein shake   Hyperlipidemia - Statin intolerance   Paroxysmal atrial fibrillation - Continue carvedilol, Eliquis    Hypothyroidism - Continue Synthroid   GERD - Continue PPI   Obesity - Noted   Lymphedema - Noted   Breast cancer in 2018 > Status post lumpectomy, radiation, exemestane then tamoxifen.  No current therapy. - Noted   DVT prophylaxis:      Eliquis Code Status:              Full Family Communication:     Discussed with patient, no family at bedside  disposition Plan: Home likely 1 to 2 days  Consultants:    Procedures:   Antimicrobials:    Objective: Vitals:   11/27/23 0127 11/27/23 0343 11/27/23 0745 11/27/23 0751  BP: (!) 115/45 (!) 112/50 (!) 125/53   Pulse: 63 62 65   Resp: 20 20 19    Temp: 98.6 F (37 C) 98.4 F (36.9 C) 97.9 F (36.6 C)   TempSrc: Oral Oral Oral   SpO2: 95% 96% 95% 96%  Weight: 89 kg     Height:        Intake/Output Summary (Last 24 hours) at 11/27/2023 1011 Last data filed at 11/27/2023 0943 Gross per 24 hour  Intake 1410.37 ml  Output 2400 ml  Net -989.63 ml   Filed Weights   11/26/23 0500 11/26/23 0536 11/27/23 0127  Weight: 87.7 kg (P) 87.8 kg 89 kg    Examination:  General exam: Elderly chronically ill female sitting up in the recliner, AAOx3 HEENT: No JVD, neck obese CVS: S1-S2, regular rhythm Lungs: Diffuse scattered rhonchi and few Rales Abdomen: Soft, nontender, bowel sounds present  Extremities: Chronic lymphedema Skin: No rashes Psychiatry:  Mood & affect appropriate.     Data Reviewed:   CBC: Recent Labs  Lab 11/24/23 1044 11/24/23 1124 11/25/23 0225 11/26/23 0229  WBC 2.8*  --  2.0* 3.2*  HGB 6.9* 7.8* 7.6* 7.6*  HCT 25.3* 23.0* 26.9* 27.3*  MCV 83.2  --  81.0 81.0  PLT 176  --  149* 149*   Basic Metabolic Panel: Recent Labs  Lab 11/24/23 1044 11/24/23 1124 11/24/23 1521 11/25/23 0225 11/26/23 0229 11/27/23 0243  NA 138 140  --  139 136 136  K 3.8 3.8  --  3.6 3.1* 4.3  CL 106  --   --  105 100 99  CO2 25  --   --  25 28 30   GLUCOSE 106*  --   --  172* 117* 95  BUN 18  --   --  20 24*  33*  CREATININE 1.56*  --   --  1.55* 1.51* 1.80*  CALCIUM 9.4  --   --  9.0 8.7* 9.0  MG  --   --  2.5*  --   --   --    GFR: Estimated Creatinine Clearance: 26.6 mL/min (A) (by C-G formula based on SCr of 1.8 mg/dL (H)). Liver Function Tests: Recent Labs  Lab 11/24/23 1044 11/25/23 0225  AST 21 20  ALT 13 13  ALKPHOS 134* 132*  BILITOT 1.2 1.1  PROT 6.7 6.5  ALBUMIN 3.3* 3.1*   No results for input(s): "LIPASE", "AMYLASE" in the last 168 hours. No results for input(s): "AMMONIA" in the last 168 hours. Coagulation Profile: No results for input(s): "INR", "PROTIME" in the last 168 hours. Cardiac Enzymes: No results for input(s): "CKTOTAL", "CKMB", "CKMBINDEX", "TROPONINI" in the last 168 hours. BNP (last 3 results) No results for input(s): "PROBNP" in the last 8760 hours. HbA1C: No results for input(s): "HGBA1C" in the last 72 hours. CBG: No results for input(s): "GLUCAP" in the last 168 hours. Lipid Profile: No results for input(s): "CHOL", "HDL", "LDLCALC", "TRIG", "CHOLHDL", "LDLDIRECT" in the last 72 hours. Thyroid Function Tests: No results for input(s): "TSH", "T4TOTAL", "FREET4", "T3FREE", "THYROIDAB" in the last 72 hours. Anemia Panel: No results for input(s): "VITAMINB12", "FOLATE", "FERRITIN", "TIBC", "IRON", "RETICCTPCT" in the last 72 hours. Urine analysis:    Component Value Date/Time   COLORURINE YELLOW 08/20/2023 2246   APPEARANCEUR TURBID (A) 08/20/2023 2246   LABSPEC 1.010 08/20/2023 2246   PHURINE 6.0 08/20/2023 2246   GLUCOSEU NEGATIVE 08/20/2023 2246   GLUCOSEU NEGATIVE 07/11/2017 1108   HGBUR SMALL (A) 08/20/2023 2246   BILIRUBINUR NEGATIVE 08/20/2023 2246   KETONESUR NEGATIVE 08/20/2023 2246   PROTEINUR 30 (A) 08/20/2023 2246   UROBILINOGEN 0.2 07/11/2017 1108   NITRITE NEGATIVE 08/20/2023 2246   LEUKOCYTESUR LARGE (A) 08/20/2023 2246   Sepsis Labs: @LABRCNTIP (procalcitonin:4,lacticidven:4)  ) Recent Results (from the past 240 hours)   Blood culture (routine x 2)     Status: None (Preliminary result)   Collection Time: 11/24/23 10:44 AM   Specimen: BLOOD LEFT HAND  Result Value Ref Range Status   Specimen Description BLOOD LEFT HAND  Final   Special Requests   Final    BOTTLES DRAWN AEROBIC AND ANAEROBIC Blood Culture results may not be optimal due to an inadequate volume of blood received in culture bottles   Culture   Final    NO GROWTH 2 DAYS Performed at [facility], [address] [place], [place] [address]    Report Status PENDING  Incomplete  Resp panel by RT-PCR (RSV, Flu A&B, Covid) Anterior Nasal Swab     Status: Abnormal   Collection Time: 11/24/23 10:44 AM   Specimen: Anterior Nasal Swab  Result Value Ref  Range Status   SARS Coronavirus 2 by RT PCR NEGATIVE NEGATIVE Final   Influenza A by PCR NEGATIVE NEGATIVE Final   Influenza B by PCR NEGATIVE NEGATIVE Final    Comment: (NOTE) The Xpert Xpress SARS-CoV-2/FLU/RSV plus assay is intended as an aid in the diagnosis of influenza from Nasopharyngeal swab specimens and should not be used as a sole basis for treatment. Nasal washings and aspirates are unacceptable for Xpert Xpress SARS-CoV-2/FLU/RSV testing.  Fact Sheet for Patients: BloggerCourse.com  Fact Sheet for Healthcare Providers: SeriousBroker.it  This test is not yet approved or cleared by the [place] FDA and has been authorized for detection and/or diagnosis of SARS-CoV-2 by FDA under an Emergency Use Authorization (EUA). This EUA will remain in effect (meaning this test can be used) for the duration of the COVID-19 declaration under Section 564(b)(1) of the Act, 21 U.S.C. section 360bbb-3(b)(1), unless the authorization is terminated or revoked.     Resp Syncytial Virus by PCR POSITIVE (A) NEGATIVE Final    Comment: (NOTE) Fact Sheet for Patients: BloggerCourse.com  Fact Sheet for Healthcare  Providers: SeriousBroker.it  This test is not yet approved or cleared by the [place] FDA and has been authorized for detection and/or diagnosis of SARS-CoV-2 by FDA under an Emergency Use Authorization (EUA). This EUA will remain in effect (meaning this test can be used) for the duration of the COVID-19 declaration under Section 564(b)(1) of the Act, 21 U.S.C. section 360bbb-3(b)(1), unless the authorization is terminated or revoked.  Performed at [facility], [address], [place], [place] [address]   Blood culture (routine x 2)     Status: Abnormal   Collection Time: 11/24/23 12:41 PM   Specimen: BLOOD LEFT HAND  Result Value Ref Range Status   Specimen Description BLOOD LEFT HAND  Final   Special Requests   Final    AEROBIC BOTTLE ONLY Blood Culture results may not be optimal due to an inadequate volume of blood received in culture bottles   Culture  Setup Time   Final    GRAM POSITIVE COCCI AEROBIC BOTTLE ONLY CRITICAL RESULT CALLED TO, READ BACK BY AND VERIFIED WITH: PHARMD JESSICA MILLEN [id number] AT 1512 BY EC    Culture (A)  Final    STAPHYLOCOCCUS HOMINIS THE SIGNIFICANCE OF ISOLATING THIS ORGANISM FROM A SINGLE SET OF BLOOD CULTURES WHEN MULTIPLE SETS ARE DRAWN IS UNCERTAIN. PLEASE NOTIFY THE MICROBIOLOGY DEPARTMENT WITHIN ONE WEEK IF SPECIATION AND SENSITIVITIES ARE REQUIRED. Performed at [facility], [address] [place], [place] [address]    Report Status 11/27/2023 FINAL  Final  Blood Culture ID Panel (Reflexed)     Status: Abnormal   Collection Time: 11/24/23 12:41 PM  Result Value Ref Range Status   Enterococcus faecalis NOT DETECTED NOT DETECTED Final   Enterococcus Faecium NOT DETECTED NOT DETECTED Final   Listeria monocytogenes NOT DETECTED NOT DETECTED Final   Staphylococcus species DETECTED (A) NOT DETECTED Final    Comment: CRITICAL RESULT CALLED TO, READ BACK BY AND VERIFIED WITH: PHARMD JESSICA MILLEN  [id number] AT 1512 BY EC    Staphylococcus aureus (BCID) NOT DETECTED NOT DETECTED Final   Staphylococcus epidermidis NOT DETECTED NOT DETECTED Final   Staphylococcus lugdunensis NOT DETECTED NOT DETECTED Final   Streptococcus species NOT DETECTED NOT DETECTED Final   Streptococcus agalactiae NOT DETECTED NOT DETECTED Final   Streptococcus pneumoniae NOT DETECTED NOT DETECTED Final   Streptococcus pyogenes NOT DETECTED NOT DETECTED Final   A.calcoaceticus-baumannii  NOT DETECTED NOT DETECTED Final   Bacteroides fragilis NOT DETECTED NOT DETECTED Final   Enterobacterales NOT DETECTED NOT DETECTED Final   Enterobacter cloacae complex NOT DETECTED NOT DETECTED Final   Escherichia coli NOT DETECTED NOT DETECTED Final   Klebsiella aerogenes NOT DETECTED NOT DETECTED Final   Klebsiella oxytoca NOT DETECTED NOT DETECTED Final   Klebsiella pneumoniae NOT DETECTED NOT DETECTED Final   Proteus species NOT DETECTED NOT DETECTED Final   Salmonella species NOT DETECTED NOT DETECTED Final   Serratia marcescens NOT DETECTED NOT DETECTED Final   Haemophilus influenzae NOT DETECTED NOT DETECTED Final   Neisseria meningitidis NOT DETECTED NOT DETECTED Final   Pseudomonas aeruginosa NOT DETECTED NOT DETECTED Final   Stenotrophomonas maltophilia NOT DETECTED NOT DETECTED Final   Candida albicans NOT DETECTED NOT DETECTED Final   Candida auris NOT DETECTED NOT DETECTED Final   Candida glabrata NOT DETECTED NOT DETECTED Final   Candida krusei NOT DETECTED NOT DETECTED Final   Candida parapsilosis NOT DETECTED NOT DETECTED Final   Candida tropicalis NOT DETECTED NOT DETECTED Final   Cryptococcus neoformans/gattii NOT DETECTED NOT DETECTED Final    Comment: Performed at [facility], [address] [place], [place] [address]     Radiology Studies: No results found.    Scheduled Meds:  allopurinol  50 mg Oral Daily   apixaban  5 mg Oral BID   carvedilol  12.5 mg Oral BID WC   feeding supplement   30 mL Oral BID   furosemide  80 mg Intravenous BID   guaiFENesin  600 mg Oral BID   ipratropium-albuterol  3 mL Nebulization QID   lactulose  10 g Oral BID   levothyroxine  50 mcg Oral QAC breakfast   pantoprazole  40 mg Oral Daily   predniSONE  40 mg Oral Q breakfast   rifaximin  550 mg Oral BID   sodium chloride flush  3 mL Intravenous Q12H   spironolactone  25 mg Oral Daily   Continuous Infusions:  ferric gluconate (FERRLECIT) IVPB 125 mg (11/27/23 0959)     LOS: 2 days    Time spent:    Zannie Cove, MD [facility]   11/27/2023, 10:11 AM

## 2023-11-27 NOTE — Evaluation (Signed)
Occupational Therapy Evaluation Patient Details Name: Katelyn Jackson MRN: [id number] DOB: 1946/11/01 Today's Date: 11/27/2023   History of Present Illness Pt is a 78 y.o. female presenting 1/17 from home with shortness of breath. CXR concerning for edema vs PNA. PMH significant for afib on Eliquis s/p PPM, hypothyroidism, pacemaker, CKD IIIb, NASH cirrhosis, HTN, HLD, CHF, breast cancer, anemia, lymphedema.   Clinical Impression   PTA, pt lived with son who assisted with transfers and ADL. Upon eval, pt with generalized weakness, decr bil shoulder ROM (baseline), increased WOB during mobility, and decreased activity tolerance. Pt able to perform bed mobility with mod A and basic transfers with min A with use of RW. Pt son works during the day but checks on her frequently. Recommending HHOT to optimize safety and independence in the home.       If plan is discharge home, recommend the following: A lot of help with walking and/or transfers;A lot of help with bathing/dressing/bathroom;Assistance with cooking/housework;Assist for transportation;Help with stairs or ramp for entrance    Functional Status Assessment  Patient has had a recent decline in their functional status and demonstrates the ability to make significant improvements in function in a reasonable and predictable amount of time.  Equipment Recommendations  None recommended by OT    Recommendations for Other Services PT consult     Precautions / Restrictions Precautions Precautions: Fall;Other (comment) Precaution Comments: Pain from L clavicular and patellar fx last year Restrictions Weight Bearing Restrictions Per Provider Order: No      Mobility Bed Mobility Overal bed mobility: Needs Assistance Bed Mobility: Supine to Sit     Supine to sit: Mod assist, HOB elevated, Used rails     General bed mobility comments: pt with fair initiation of bring BLE off EOB, but needing assist to pivot hips around and scoot  forward. No assist for elevating trunk but HOB was elevated    Transfers Overall transfer level: Needs assistance Equipment used: Rolling walker (2 wheels) Transfers: Sit to/from Stand, Bed to chair/wheelchair/BSC Sit to Stand: Min assist     Step pivot transfers: Contact guard assist     General transfer comment: Min A for rise up from EOB but then CGA for safety with steps to recliner.      Balance Overall balance assessment: Mild deficits observed, not formally tested                                         ADL either performed or assessed with clinical judgement   ADL Overall ADL's : Needs assistance/impaired Eating/Feeding: Set up;Sitting Eating/Feeding Details (indicate cue type and reason): finishing breakfast on arrival Grooming: Set up;Sitting;Wash/dry face   Upper Body Bathing: Minimal assistance;Sitting   Lower Body Bathing: Sit to/from stand;Maximal assistance   Upper Body Dressing : Moderate assistance;Sitting   Lower Body Dressing: Total assistance;Sit to/from stand   Toilet Transfer: Minimal assistance;Stand-pivot;Rolling walker (2 wheels);BSC/3in1 Toilet Transfer Details (indicate cue type and reason): simulated         Functional mobility during ADLs: Contact guard assist;Rolling walker (2 wheels) General ADL Comments: CGA for step pivot mbut does need assist to rise     Vision Ability to See in Adequate Light: 0 Adequate Patient Visual Report: No change from baseline Vision Assessment?: No apparent visual deficits Additional Comments: using phone [facility]     Perception Perception: Not tested  Praxis Praxis: Not tested       Pertinent Vitals/Pain Pain Assessment Pain Assessment: Faces Faces Pain Scale: Hurts little more Pain Location: L knee Pain Descriptors / Indicators: Discomfort, Aching Pain Intervention(s): Limited activity within patient's tolerance, Monitored during session     Extremity/Trunk Assessment  Upper Extremity Assessment Upper Extremity Assessment: Generalized weakness;RUE deficits/detail;LUE deficits/detail RUE Deficits / Details: pt reports bil UE rotator cuff tears. pt with active shoulder flexion on R to ~5 degrees. using L arm to place L arm on RW prior to transfer. Pt with generalized weakness, but using BUE to adjust self in recliner chair, etc. mild popping noted with pushing through RW for transfers LUE Deficits / Details: Pt reports bil rotator cuff tear and with hx of L clavicular fx last year. Active shoulder flexion 0-~30 degrees. Using LUE to lift RUE to RW prior to transfer. generalized weakness otherwise. mild popping noted with pushing through RW for transfers   Lower Extremity Assessment Lower Extremity Assessment: Defer to PT evaluation   Cervical / Trunk Assessment Cervical / Trunk Assessment: Kyphotic (forward rounding of shoulders)   Communication Communication Communication: No apparent difficulties;Other (comment) (speaks russian, but [place] is good for communication of basic needs and following commands.) Cueing Techniques: Verbal cues;Gestural cues   Cognition Arousal: Alert Behavior During Therapy: WFL for tasks assessed/performed Overall Cognitive Status: Within Functional Limits for tasks assessed                                 General Comments: follows all one step commands, prefers russian language for history taking and more in depth conversations but able to communicate in [place] throughout session. son provided home set-up via phone     General Comments  VSS, but pt with mild dyspnea  2/4    Exercises     Shoulder Instructions      Home Living Family/patient expects to be discharged to:: Private residence Living Arrangements: Children (son) Available Help at Discharge: Family;Available PRN/intermittently Type of Home: House Home Access: Ramped entrance     Home Layout: Multi-level;Able to live on main level with  bedroom/bathroom     Bathroom Shower/Tub: Chief Strategy Officer: Standard     Home Equipment: Tub bench;Wheelchair - Forensic psychologist (2 wheels);Rollator (4 wheels)          Prior Functioning/Environment Prior Level of Function : Needs assist             Mobility Comments: Uses wheelchair mostly since home for recent rehab stay at SNF; Walking very short distances with RW, Doorframe of Bathroom to shower/toilet with assist. ADLs Comments: States her son and DIL assist with bathing and dressing.  DIL completes meals and iADL        OT Problem List: Decreased strength;Impaired balance (sitting and/or standing);Decreased knowledge of use of DME or AE;Cardiopulmonary status limiting activity;Decreased activity tolerance      OT Treatment/Interventions: Therapeutic exercise;Self-care/ADL training;DME and/or AE instruction;Energy conservation;Balance training;Patient/family education;Therapeutic activities    OT Goals(Current goals can be found in the care plan section) Acute Rehab OT Goals Patient Stated Goal: breathe better OT Goal Formulation: With patient Time For Goal Achievement: 12/11/23 Potential to Achieve Goals: Good  OT Frequency: Min 1X/week    Co-evaluation              AM-PAC OT "6 Clicks" Daily Activity     Outcome Measure Help from another person eating meals?:  None Help from another person taking care of personal grooming?: A Little Help from another person toileting, which includes using toliet, bedpan, or urinal?: A Lot Help from another person bathing (including washing, rinsing, drying)?: A Lot Help from another person to put on and taking off regular upper body clothing?: A Lot Help from another person to put on and taking off regular lower body clothing?: Total 6 Click Score: 14   End of Session Equipment Utilized During Treatment: Gait belt;Rolling walker (2 wheels) Nurse Communication: Mobility status (forgot to plug in  chair alarm but does not get up without someone present at baseline)  Activity Tolerance: Patient tolerated treatment well Patient left: in chair;with call bell/phone within reach  OT Visit Diagnosis: Unsteadiness on feet (R26.81);Muscle weakness (generalized) (M62.81);Pain Pain - Right/Left: Left Pain - part of body: Knee;Shoulder                Time: 6213-0865 OT Time Calculation (min): 21 min Charges:  OT General Charges $OT Visit: 1 Visit OT Evaluation $OT Eval Low Complexity: 1 Low  Tyler Deis, OTR/L [facility] Acute Rehabilitation Office: [phone]   Myrla Halsted 11/27/2023, 9:13 AM

## 2023-11-27 NOTE — Progress Notes (Signed)
Heart Failure Navigator Progress Note  Assessed for Heart & Vascular TOC clinic readiness.  Patient EF 40-45%, No HF TOC per Dr. Jomarie Longs, will follow up with [facility]. .   Navigator will sign off at this time.   Rhae Hammock, BSN, Scientist, clinical (histocompatibility and immunogenetics) Only

## 2023-11-28 DIAGNOSIS — I5023 Acute on chronic systolic (congestive) heart failure: Secondary | ICD-10-CM | POA: Diagnosis not present

## 2023-11-28 LAB — CBC
HCT: 27.2 % — ABNORMAL LOW (ref 36.0–46.0)
Hemoglobin: 7.7 g/dL — ABNORMAL LOW (ref 12.0–15.0)
MCH: 22.7 pg — ABNORMAL LOW (ref 26.0–34.0)
MCHC: 28.3 g/dL — ABNORMAL LOW (ref 30.0–36.0)
MCV: 80.2 fL (ref 80.0–100.0)
Platelets: 165 10*3/uL (ref 150–400)
RBC: 3.39 MIL/uL — ABNORMAL LOW (ref 3.87–5.11)
RDW: 20.9 % — ABNORMAL HIGH (ref 11.5–15.5)
WBC: 4 10*3/uL (ref 4.0–10.5)
nRBC: 0 % (ref 0.0–0.2)

## 2023-11-28 LAB — BASIC METABOLIC PANEL
Anion gap: 11 (ref 5–15)
BUN: 34 mg/dL — ABNORMAL HIGH (ref 8–23)
CO2: 27 mmol/L (ref 22–32)
Calcium: 9.4 mg/dL (ref 8.9–10.3)
Chloride: 99 mmol/L (ref 98–111)
Creatinine, Ser: 1.8 mg/dL — ABNORMAL HIGH (ref 0.44–1.00)
GFR, Estimated: 29 mL/min — ABNORMAL LOW (ref >60)
Glucose, Bld: 116 mg/dL — ABNORMAL HIGH (ref 70–99)
Potassium: 4.2 mmol/L (ref 3.5–5.1)
Sodium: 137 mmol/L (ref 135–145)

## 2023-11-28 NOTE — TOC Initial Note (Signed)
Transition of Care [facility]) - Initial/Assessment Note    Patient Details  Name: Katelyn Jackson MRN: [id number] Date of Birth: 1946-05-05  Transition of Care [facility]) CM/SW Contact:    Leone Haven, RN Phone Number: 11/28/2023, 5:24 PM  Clinical Narrative:                 Patient gave this NCM permission to speak with her son, Jeb Levering, he states From home with him,  has PCP and insurance on file, states she is active with [facility] for [facility], HHPT, HHOT and would like to continue with therm. She has walker and w/chair at home.   States he  will transport them home at [employer] and family is support system, states gets medications from [facility].  Pta self ambulatory  with walker. NCM confirmed with Kandee Keen with [facility] that she is active with them for HHRN, HHPT , HHOT.    Expected Discharge Plan: Home w Home Health Services Barriers to Discharge: Continued Medical Work up   Patient Goals and CMS Choice Patient states their goals for this hospitalization and ongoing recovery are:: return home with son CMS Medicare.gov Compare Post Acute Care list provided to:: Patient Represenative (must comment) Choice offered to / list presented to : Adult Children      Expected Discharge Plan and Services In-house Referral: NA Discharge Planning Services: CM Consult Post Acute Care Choice: Home Health, Resumption of Svcs/PTA Provider Living arrangements for the past 2 months: Single Family Home                 DME Arranged: N/A DME Agency: NA       HH Arranged: RN, PT, OT HH Agency: [facility] Date [facility] Agency Contacted: 11/28/23 Time HH Agency Contacted: 1723 Representative spoke with at [facility] Agency: Kandee Keen  Prior Living Arrangements/Services Living arrangements for the past 2 months: Single Family Home Lives with:: Adult Children Patient language and need for interpreter reviewed:: Yes Do you feel safe going back to the place where you live?: Yes      Need for Family  Participation in Patient Care: Yes (Comment) Care giver support system in place?: Yes (comment) Current home services: DME (walker/w/chair) Criminal Activity/Legal Involvement Pertinent to Current Situation/Hospitalization: No - Comment as needed  Activities of Daily Living   ADL Screening (condition at time of admission) Independently performs ADLs?: No Does the patient have a NEW difficulty with bathing/dressing/toileting/self-feeding that is expected to last >3 days?: No (needs assist) Does the patient have a NEW difficulty with getting in/out of bed, walking, or climbing stairs that is expected to last >3 days?: No (needs assist) Does the patient have a NEW difficulty with communication that is expected to last >3 days?: No Is the patient deaf or have difficulty hearing?: No Does the patient have difficulty seeing, even when wearing glasses/contacts?: No Does the patient have difficulty concentrating, remembering, or making decisions?: No  Permission Sought/Granted Permission sought to share information with : Case Manager Permission granted to share information with : Yes, Verbal Permission Granted  Share Information with NAME: son  Permission granted to share info w AGENCY: HH,        Emotional Assessment Appearance:: Appears stated age Attitude/Demeanor/Rapport: Engaged Affect (typically observed): Appropriate Orientation: : Oriented to Self, Oriented to Place, Oriented to  Time, Oriented to Situation Alcohol / Substance Use: Not Applicable Psych Involvement: No (comment)  Admission diagnosis:  RSV (respiratory syncytial virus pneumonia) [J12.1] Acute respiratory failure with hypoxia (HCC) [  J96.01] Anemia, unspecified type [D64.9] Dyspnea, unspecified type [R06.00] Community acquired pneumonia, unspecified laterality [J18.9] Patient Active Problem List   Diagnosis Date Noted   Acute renal failure superimposed on stage 3b chronic kidney disease (HCC) 11/24/2023   Acute  on chronic systolic CHF (congestive heart failure) (HCC) 11/24/2023   RSV (respiratory syncytial virus pneumonia) 11/24/2023   Acute respiratory failure with hypoxia (HCC) 11/24/2023   Statin intolerance 11/02/2023   Stroke-like symptoms 08/20/2023   Closed fracture of left clavicle 06/21/2023   Dehydration 06/10/2023   Constipation 06/09/2023   Foley catheter in place prior to arrival 06/09/2023   Increased endometrial stripe thickness 06/09/2023   Closed patellar sleeve fracture of left knee 05/20/2023   Fall 05/18/2023   Patella fracture 05/18/2023   Clavicle fracture 05/18/2023   Abnormal urinalysis 05/18/2023   History of breast cancer 05/18/2023   Intertrigo 04/17/2023   Skin lesion 03/08/2023   Chronic systolic CHF (congestive heart failure) (HCC) 12/16/2022   Callus 11/04/2022   Cardiac pacemaker in situ 12/10/2021   Morbid obesity with BMI of 45.0-49.9, adult (HCC) 12/10/2021   Breast lesion 12/07/2021   Dry skin 09/22/2021   Colonization with VRE (vancomycin-resistant enterococcus) 09/22/2021   Osteoarthritis of right knee 08/19/2021   Herpes labialis 08/19/2021   Liver cirrhosis (HCC) 07/22/2021   Abscess    Prosthetic joint infection (HCC)    Abscess of right shoulder 07/21/2021   History of pacemaker 07/21/2021   Pressure injury of skin 07/03/2021   Hypothyroidism    Cellulitis    Postoperative anemia due to acute blood loss 03/19/2021   Dyspnea 03/19/2021   Symptomatic anemia 03/19/2021   Status post right shoulder hemiarthroplasty 03/17/2021   Pacemaker reprogramming/check 01/06/2021   Bradycardia 01/05/2021   CAP (community acquired pneumonia) 01/05/2021   CKD stage 3b, GFR 30-44 ml/min (HCC) 01/05/2021   Bilateral pain of leg and foot 10/08/2020   Abscess of left thumb 06/17/2020   Cellulitis and abscess of left leg 06/17/2020   Herpes zoster 02/18/2020   GERD (gastroesophageal reflux disease) 11/25/2019   Wheezing 10/26/2017   Sciatica of left side  08/29/2017   Morbid obesity (HCC) 07/11/2017   Hyperglycemia 07/11/2017   Low back pain 06/19/2017   Hypokalemia 06/19/2017   Anemia of chronic disease 02/16/2017   Malignant neoplasm of upper-outer quadrant of left breast in female, estrogen receptor positive (HCC) 02/10/2017   Essential hypertension 04/05/2016   Dyslipidemia 04/05/2016   Chronic atrial fibrillation (HCC) 04/05/2016   Pulmonary hypertension (HCC) 04/05/2016   Lymphedema 04/05/2016   PCP:  Tresa Garter, MD Pharmacy:   [facility] [place], [place] - [address] [place] [address] Phone: [phone] Fax: [phone]     Social Drivers of Health (SDOH) Social History: SDOH Screenings   Food Insecurity: No Food Insecurity (11/24/2023)  Housing: Low Risk  (11/24/2023)  Transportation Needs: No Transportation Needs (11/24/2023)  Utilities: Not At Risk (11/24/2023)  Alcohol Screen: Low Risk  (06/15/2022)  Depression (PHQ2-9): Low Risk  (04/17/2023)  Financial Resource Strain: Low Risk  (06/15/2022)  Physical Activity: Inactive (06/15/2022)  Social Connections: Socially Isolated (11/24/2023)  Stress: No Stress Concern Present (06/15/2022)  Tobacco Use: Low Risk  (11/24/2023)   SDOH Interventions:     Readmission Risk Interventions    11/28/2023    5:18 PM 07/06/2021   11:49 AM 03/19/2021    3:03 PM  Readmission Risk Prevention Plan  Transportation Screening Complete Complete Complete  HRI or  Home Care Consult   Complete  Social Work Consult for Recovery Care Planning/Counseling   Complete  Palliative Care Screening   Not Applicable  Medication Review Oceanographer) Complete  Complete  HRI or Home Care Consult Complete Complete   SW Recovery Care/Counseling Consult  Complete   Palliative Care Screening Not Applicable Not Applicable   Skilled Nursing Facility  Complete

## 2023-11-28 NOTE — Plan of Care (Signed)

## 2023-11-28 NOTE — Progress Notes (Signed)
Mobility Specialist Progress Note:   11/28/23 1017  Mobility  Activity Ambulated with assistance in hallway;Ambulated with assistance in room  Level of Assistance Contact guard assist, steadying assist  Assistive Device Front wheel walker  Distance Ambulated (ft) 30 ft  Activity Response Tolerated well  Mobility Referral Yes  Mobility visit 1 Mobility  Mobility Specialist Start Time (ACUTE ONLY) 1017  Mobility Specialist Stop Time (ACUTE ONLY) 1031  Mobility Specialist Time Calculation (min) (ACUTE ONLY) 14 min   Pt eager for mobility session. Required contact assist during ambulation. Bilateral knee instability noted, no overt LOB or buckling observed. Distance continues to be limited by generalized fatigue and weakness. Ambulated on 2LO2. Pt back in chair with all needs met.   Addison Lank Mobility Specialist Please contact via SecureChat or  Rehab office at [phone]

## 2023-11-28 NOTE — Progress Notes (Addendum)
Mobility Specialist Progress Note:   11/28/23 1600  Mobility  Activity Ambulated with assistance in hallway  Level of Assistance Moderate assist, patient does 50-74%  Assistive Device Front wheel walker  Distance Ambulated (ft) 50 ft  Activity Response Tolerated fair  Mobility Referral Yes  Mobility visit 1 Mobility  Mobility Specialist Start Time (ACUTE ONLY) 1600  Mobility Specialist Stop Time (ACUTE ONLY) 1616  Mobility Specialist Time Calculation (min) (ACUTE ONLY) 16 min   Pt requesting to ambulate again, however requiring incr assistance during this session. Ambulated with MinG until ~60ft where pt's knees began to give away. When told to straighten knees, pt states she can't. At 50 feet, pt states she can't go any further, pulled chair around and pt turned to sit. Left with all needs met.   Ambulated on RA, with SpO2 >90% throughout. However, RN states pt wears 2L at home.  Addison Lank Mobility Specialist Please contact via SecureChat or  Rehab office at [phone]

## 2023-11-28 NOTE — Progress Notes (Signed)
PROGRESS NOTE    Katelyn Jackson  NWG:[id number] DOB: Mar 17, 1946 DOA: 11/24/2023 PCP: Tresa Garter, MD   [name] w hypertension, hyperlipidemia, atrial fibrillation, anemia, GERD, hypothyroidism, pacemaker, CKD 3B, obesity, NASH cirrhosis, sCHF, chronic respiratory failure on home O2 breast cancer, lymphedema presented to ED with worsening shortness of breath and cough, symptoms started few days prior, EMS called for respiratory distress, placed on BiPAP.  In the ED hypertensive, tachypneic weaned off BiPAP to nasal cannula, creatinine 1.6, WBC 2.8, hemoglobin 6.9 from baseline of 7-8, BNP 1051, troponin 33, 39, RSV positive, FOBT negative, chest x-ray with interstitial markings concerning for edema versus pneumonia. -Improving with diuretics, steroids, pulmonary toilet   Subjective: -Breathing a little better, still coughing a lot and very congested  Assessment and Plan:  Acute respiratory failure with hypoxia Acute on chronic systolic CHF RSV +, reactive airway disease -Last echo 10/24 noted EF 40-45%, indeterminate diastolic function, moderately reduced RV  -Also has cirrhosis, and chronic lymphedema -Required BiPAP on admission, now off and stable -Diuresed with IV Lasix, down 15 LB, mild uptrend in creatinine now stabilizing, restart torsemide at low-dose, continue Aldactone and Coreg -Still extremely congested, continue prednisone, scheduled DuoNebs, Mucinex, flutter valve, out of bed to chair -PT eval, discharge planning  Liver cirrhosis/NASH -Lasix and Aldactone as noted above -On lactulose and rifaximin, decreased lactulose dose  Acute on chronic anemia, history of iron deficiency -MCV is normal now -Hemoglobin baseline around 7-8, presented at 6.9, likely dilutional component as well -Recent anemia panel 12/24 with severe iron deficiency, continue IV iron, day 3   Hypertension -Stable, meds as above   NASH cirrhosis -Diuretics as above - Continue lactulose and  rifaximin - Continue protein shake   Hyperlipidemia - Statin intolerance   Paroxysmal atrial fibrillation - Continue carvedilol, Eliquis   Hypothyroidism - Continue Synthroid   GERD - Continue PPI   Obesity - Noted   Lymphedema - Noted   Breast cancer in 2018 > Status post lumpectomy, radiation, exemestane then tamoxifen.  No current therapy. - Noted   DVT prophylaxis:      Eliquis Code Status:              Full Family Communication:     Discussed with patient, no family at bedside  disposition Plan: Home likely 1 to 2 days  Consultants:    Procedures:   Antimicrobials:    Objective: Vitals:   11/28/23 0058 11/28/23 0433 11/28/23 0717 11/28/23 0750  BP: (!) 115/49 (!) 133/56  99/77  Pulse: 63 68  64  Resp: 18 (!) 21  20  Temp: 98.9 F (37.2 C) 98.5 F (36.9 C)  98.2 F (36.8 C)  TempSrc: Oral Oral  Oral  SpO2: 94% 93% 93% 99%  Weight:  84.3 kg    Height:        Intake/Output Summary (Last 24 hours) at 11/28/2023 1001 Last data filed at 11/28/2023 0757 Gross per 24 hour  Intake 570.07 ml  Output 2950 ml  Net -2379.93 ml   Filed Weights   11/26/23 0536 11/27/23 0127 11/28/23 0433  Weight: (P) 87.8 kg 89 kg 84.3 kg    Examination:  General exam: Elderly chronically ill female sitting up in the recliner, AAOx3 HEENT: No JVD, neck obese CVS: S1-S2, regular rhythm Lungs: Diffusely scattered rhonchi bilaterally Abdomen: Soft, nontender, bowel sounds present  Extremities: Chronic lymphedema Skin: No rashes Psychiatry:  Mood & affect appropriate.     Data Reviewed:   CBC: Recent Labs  Lab 11/24/23 1044 11/24/23 1124 11/25/23 0225 11/26/23 0229 11/28/23 0238  WBC 2.8*  --  2.0* 3.2* 4.0  HGB 6.9* 7.8* 7.6* 7.6* 7.7*  HCT 25.3* 23.0* 26.9* 27.3* 27.2*  MCV 83.2  --  81.0 81.0 80.2  PLT 176  --  149* 149* 165   Basic Metabolic Panel: Recent Labs  Lab 11/24/23 1044 11/24/23 1124 11/24/23 1521 11/25/23 0225 11/26/23 0229  11/27/23 0243 11/28/23 0238  NA 138 140  --  139 136 136 137  K 3.8 3.8  --  3.6 3.1* 4.3 4.2  CL 106  --   --  105 100 99 99  CO2 25  --   --  25 28 30 27   GLUCOSE 106*  --   --  172* 117* 95 116*  BUN 18  --   --  20 24* 33* 34*  CREATININE 1.56*  --   --  1.55* 1.51* 1.80* 1.80*  CALCIUM 9.4  --   --  9.0 8.7* 9.0 9.4  MG  --   --  2.5*  --   --   --   --    GFR: Estimated Creatinine Clearance: 25.8 mL/min (A) (by C-G formula based on SCr of 1.8 mg/dL (H)). Liver Function Tests: Recent Labs  Lab 11/24/23 1044 11/25/23 0225  AST 21 20  ALT 13 13  ALKPHOS 134* 132*  BILITOT 1.2 1.1  PROT 6.7 6.5  ALBUMIN 3.3* 3.1*   No results for input(s): "LIPASE", "AMYLASE" in the last 168 hours. No results for input(s): "AMMONIA" in the last 168 hours. Coagulation Profile: No results for input(s): "INR", "PROTIME" in the last 168 hours. Cardiac Enzymes: No results for input(s): "CKTOTAL", "CKMB", "CKMBINDEX", "TROPONINI" in the last 168 hours. BNP (last 3 results) No results for input(s): "PROBNP" in the last 8760 hours. HbA1C: No results for input(s): "HGBA1C" in the last 72 hours. CBG: No results for input(s): "GLUCAP" in the last 168 hours. Lipid Profile: No results for input(s): "CHOL", "HDL", "LDLCALC", "TRIG", "CHOLHDL", "LDLDIRECT" in the last 72 hours. Thyroid Function Tests: No results for input(s): "TSH", "T4TOTAL", "FREET4", "T3FREE", "THYROIDAB" in the last 72 hours. Anemia Panel: No results for input(s): "VITAMINB12", "FOLATE", "FERRITIN", "TIBC", "IRON", "RETICCTPCT" in the last 72 hours. Urine analysis:    Component Value Date/Time   COLORURINE YELLOW 08/20/2023 2246   APPEARANCEUR TURBID (A) 08/20/2023 2246   LABSPEC 1.010 08/20/2023 2246   PHURINE 6.0 08/20/2023 2246   GLUCOSEU NEGATIVE 08/20/2023 2246   GLUCOSEU NEGATIVE 07/11/2017 1108   HGBUR SMALL (A) 08/20/2023 2246   BILIRUBINUR NEGATIVE 08/20/2023 2246   KETONESUR NEGATIVE 08/20/2023 2246    PROTEINUR 30 (A) 08/20/2023 2246   UROBILINOGEN 0.2 07/11/2017 1108   NITRITE NEGATIVE 08/20/2023 2246   LEUKOCYTESUR LARGE (A) 08/20/2023 2246   Sepsis Labs: @LABRCNTIP (procalcitonin:4,lacticidven:4)  ) Recent Results (from the past 240 hours)  Blood culture (routine x 2)     Status: None (Preliminary result)   Collection Time: 11/24/23 10:44 AM   Specimen: BLOOD LEFT HAND  Result Value Ref Range Status   Specimen Description BLOOD LEFT HAND  Final   Special Requests   Final    BOTTLES DRAWN AEROBIC AND ANAEROBIC Blood Culture results may not be optimal due to an inadequate volume of blood received in culture bottles   Culture   Final    NO GROWTH 3 DAYS Performed at [facility], [address] [place], [place] [address]    Report Status  PENDING  Incomplete  Resp panel by RT-PCR (RSV, Flu A&B, Covid) Anterior Nasal Swab     Status: Abnormal   Collection Time: 11/24/23 10:44 AM   Specimen: Anterior Nasal Swab  Result Value Ref Range Status   SARS Coronavirus 2 by RT PCR NEGATIVE NEGATIVE Final   Influenza A by PCR NEGATIVE NEGATIVE Final   Influenza B by PCR NEGATIVE NEGATIVE Final    Comment: (NOTE) The Xpert Xpress SARS-CoV-2/FLU/RSV plus assay is intended as an aid in the diagnosis of influenza from Nasopharyngeal swab specimens and should not be used as a sole basis for treatment. Nasal washings and aspirates are unacceptable for Xpert Xpress SARS-CoV-2/FLU/RSV testing.  Fact Sheet for Patients: BloggerCourse.com  Fact Sheet for Healthcare Providers: SeriousBroker.it  This test is not yet approved or cleared by the [place] FDA and has been authorized for detection and/or diagnosis of SARS-CoV-2 by FDA under an Emergency Use Authorization (EUA). This EUA will remain in effect (meaning this test can be used) for the duration of the COVID-19 declaration under Section 564(b)(1) of the Act, 21 U.S.C. section  360bbb-3(b)(1), unless the authorization is terminated or revoked.     Resp Syncytial Virus by PCR POSITIVE (A) NEGATIVE Final    Comment: (NOTE) Fact Sheet for Patients: BloggerCourse.com  Fact Sheet for Healthcare Providers: SeriousBroker.it  This test is not yet approved or cleared by the [place] FDA and has been authorized for detection and/or diagnosis of SARS-CoV-2 by FDA under an Emergency Use Authorization (EUA). This EUA will remain in effect (meaning this test can be used) for the duration of the COVID-19 declaration under Section 564(b)(1) of the Act, 21 U.S.C. section 360bbb-3(b)(1), unless the authorization is terminated or revoked.  Performed at [facility], [address] [place], [place] [address]   Blood culture (routine x 2)     Status: Abnormal   Collection Time: 11/24/23 12:41 PM   Specimen: BLOOD LEFT HAND  Result Value Ref Range Status   Specimen Description BLOOD LEFT HAND  Final   Special Requests   Final    AEROBIC BOTTLE ONLY Blood Culture results may not be optimal due to an inadequate volume of blood received in culture bottles   Culture  Setup Time   Final    GRAM POSITIVE COCCI AEROBIC BOTTLE ONLY CRITICAL RESULT CALLED TO, READ BACK BY AND VERIFIED WITH: PHARMD JESSICA MILLEN [id number] AT 1512 BY EC    Culture (A)  Final    STAPHYLOCOCCUS HOMINIS THE SIGNIFICANCE OF ISOLATING THIS ORGANISM FROM A SINGLE SET OF BLOOD CULTURES WHEN MULTIPLE SETS ARE DRAWN IS UNCERTAIN. PLEASE NOTIFY THE MICROBIOLOGY DEPARTMENT WITHIN ONE WEEK IF SPECIATION AND SENSITIVITIES ARE REQUIRED. Performed at [facility], [address] [place], [place] [address]    Report Status 11/27/2023 FINAL  Final  Blood Culture ID Panel (Reflexed)     Status: Abnormal   Collection Time: 11/24/23 12:41 PM  Result Value Ref Range Status   Enterococcus faecalis NOT DETECTED NOT DETECTED Final   Enterococcus  Faecium NOT DETECTED NOT DETECTED Final   Listeria monocytogenes NOT DETECTED NOT DETECTED Final   Staphylococcus species DETECTED (A) NOT DETECTED Final    Comment: CRITICAL RESULT CALLED TO, READ BACK BY AND VERIFIED WITH: PHARMD JESSICA MILLEN [id number] AT 1512 BY EC    Staphylococcus aureus (BCID) NOT DETECTED NOT DETECTED Final   Staphylococcus epidermidis NOT DETECTED NOT DETECTED Final   Staphylococcus lugdunensis NOT DETECTED NOT DETECTED Final  Streptococcus species NOT DETECTED NOT DETECTED Final   Streptococcus agalactiae NOT DETECTED NOT DETECTED Final   Streptococcus pneumoniae NOT DETECTED NOT DETECTED Final   Streptococcus pyogenes NOT DETECTED NOT DETECTED Final   A.calcoaceticus-baumannii NOT DETECTED NOT DETECTED Final   Bacteroides fragilis NOT DETECTED NOT DETECTED Final   Enterobacterales NOT DETECTED NOT DETECTED Final   Enterobacter cloacae complex NOT DETECTED NOT DETECTED Final   Escherichia coli NOT DETECTED NOT DETECTED Final   Klebsiella aerogenes NOT DETECTED NOT DETECTED Final   Klebsiella oxytoca NOT DETECTED NOT DETECTED Final   Klebsiella pneumoniae NOT DETECTED NOT DETECTED Final   Proteus species NOT DETECTED NOT DETECTED Final   Salmonella species NOT DETECTED NOT DETECTED Final   Serratia marcescens NOT DETECTED NOT DETECTED Final   Haemophilus influenzae NOT DETECTED NOT DETECTED Final   Neisseria meningitidis NOT DETECTED NOT DETECTED Final   Pseudomonas aeruginosa NOT DETECTED NOT DETECTED Final   Stenotrophomonas maltophilia NOT DETECTED NOT DETECTED Final   Candida albicans NOT DETECTED NOT DETECTED Final   Candida auris NOT DETECTED NOT DETECTED Final   Candida glabrata NOT DETECTED NOT DETECTED Final   Candida krusei NOT DETECTED NOT DETECTED Final   Candida parapsilosis NOT DETECTED NOT DETECTED Final   Candida tropicalis NOT DETECTED NOT DETECTED Final   Cryptococcus neoformans/gattii NOT DETECTED NOT DETECTED Final    Comment:  Performed at [facility], [address], [place], [place] [address]     Radiology Studies: No results found.    Scheduled Meds:  allopurinol  50 mg Oral Daily   apixaban  5 mg Oral BID   carvedilol  12.5 mg Oral BID WC   feeding supplement  30 mL Oral BID   guaiFENesin  600 mg Oral BID   ipratropium-albuterol  3 mL Nebulization QID   lactulose  10 g Oral BID   levothyroxine  50 mcg Oral QAC breakfast   pantoprazole  40 mg Oral Daily   predniSONE  40 mg Oral Q breakfast   rifaximin  550 mg Oral BID   sodium chloride flush  3 mL Intravenous Q12H   spironolactone  25 mg Oral Daily   Continuous Infusions:     LOS: 3 days    Time spent:    Zannie Cove, MD [facility]   11/28/2023, 10:01 AM

## 2023-11-29 DIAGNOSIS — I5023 Acute on chronic systolic (congestive) heart failure: Secondary | ICD-10-CM | POA: Diagnosis not present

## 2023-11-29 DIAGNOSIS — I480 Paroxysmal atrial fibrillation: Secondary | ICD-10-CM | POA: Diagnosis not present

## 2023-11-29 DIAGNOSIS — J121 Respiratory syncytial virus pneumonia: Secondary | ICD-10-CM | POA: Diagnosis not present

## 2023-11-29 DIAGNOSIS — I10 Essential (primary) hypertension: Secondary | ICD-10-CM | POA: Diagnosis not present

## 2023-11-29 DIAGNOSIS — E66812 Obesity, class 2: Secondary | ICD-10-CM

## 2023-11-29 LAB — BASIC METABOLIC PANEL
Anion gap: 7 (ref 5–15)
BUN: 32 mg/dL — ABNORMAL HIGH (ref 8–23)
CO2: 29 mmol/L (ref 22–32)
Calcium: 9.5 mg/dL (ref 8.9–10.3)
Chloride: 98 mmol/L (ref 98–111)
Creatinine, Ser: 1.68 mg/dL — ABNORMAL HIGH (ref 0.44–1.00)
GFR, Estimated: 31 mL/min — ABNORMAL LOW (ref >60)
Glucose, Bld: 130 mg/dL — ABNORMAL HIGH (ref 70–99)
Potassium: 4.4 mmol/L (ref 3.5–5.1)
Sodium: 134 mmol/L — ABNORMAL LOW (ref 135–145)

## 2023-11-29 LAB — CULTURE, BLOOD (ROUTINE X 2): Culture: NO GROWTH

## 2023-11-29 LAB — CBC
HCT: 27.8 % — ABNORMAL LOW (ref 36.0–46.0)
Hemoglobin: 7.8 g/dL — ABNORMAL LOW (ref 12.0–15.0)
MCH: 22.9 pg — ABNORMAL LOW (ref 26.0–34.0)
MCHC: 28.1 g/dL — ABNORMAL LOW (ref 30.0–36.0)
MCV: 81.5 fL (ref 80.0–100.0)
Platelets: 155 10*3/uL (ref 150–400)
RBC: 3.41 MIL/uL — ABNORMAL LOW (ref 3.87–5.11)
RDW: 21 % — ABNORMAL HIGH (ref 11.5–15.5)
WBC: 4.1 10*3/uL (ref 4.0–10.5)
nRBC: 0 % (ref 0.0–0.2)

## 2023-11-29 MED ORDER — GUAIFENESIN-DM 100-10 MG/5ML PO SYRP
5.0000 mL | ORAL_SOLUTION | Freq: Four times a day (QID) | ORAL | Status: DC
  Administered 2023-11-29 – 2023-12-01 (×9): 5 mL via ORAL
  Filled 2023-11-29 (×9): qty 5

## 2023-11-29 MED ORDER — GUAIFENESIN ER 600 MG PO TB12
600.0000 mg | ORAL_TABLET | Freq: Two times a day (BID) | ORAL | Status: DC | PRN
  Administered 2023-12-01: 600 mg via ORAL
  Filled 2023-11-29: qty 1

## 2023-11-29 MED ORDER — BISACODYL 5 MG PO TBEC
10.0000 mg | DELAYED_RELEASE_TABLET | Freq: Once | ORAL | Status: AC
Start: 2023-11-29 — End: 2023-11-29
  Administered 2023-11-29: 10 mg via ORAL
  Filled 2023-11-29: qty 2

## 2023-11-29 NOTE — Assessment & Plan Note (Signed)
Patient with statin intolerance.

## 2023-11-29 NOTE — Progress Notes (Signed)
Progress Note   Patient: Katelyn Jackson ZOX:[id number] DOB: June 02, 1946 DOA: 11/24/2023     4 DOS: the patient was seen and examined on 11/29/2023   Brief hospital course: [name] w hypertension, hyperlipidemia, atrial fibrillation, anemia, GERD, hypothyroidism, pacemaker, CKD 3B, obesity, NASH cirrhosis, sCHF, chronic respiratory failure on home O2 breast cancer, lymphedema presented to ED with worsening shortness of breath and cough, symptoms started few days prior, EMS called for respiratory distress, placed on BiPAP.  In the ED hypertensive, tachypneic weaned off BiPAP to nasal cannula, creatinine 1.6, WBC 2.8, hemoglobin 6.9 from baseline of 7-8, BNP 1051, troponin 33, 39, RSV positive, FOBT negative, chest x-ray with interstitial markings concerning for edema versus pneumonia. -Improving with diuretics, steroids, pulmonary toilet  Assessment and Plan: Acute on chronic systolic CHF (congestive heart failure) (HCC) Echocardiogram with preserved LV systolic function EF 40 to 45%, global hypokinesis, mild to moderate LV cavity dilatation, RV systolic function with moderate reduction, RV with moderate enlargement, RVSP 36.7 mmHg, LA with severe dilatation, possible PFO with left to right shunting, RA with severe dilatation, TR mild to moderate,   Urine output 1100 ml Systolic blood pressure 115 mmHg range.   Plan to continue with carvedilol and spironolactone.  Holding loop diuretic for now.   RSV (respiratory syncytial virus pneumonia) Acute viral bronchitis.  Plan to continue bronchodilator therapy, antitussive agents and airway clearing techniques with flutter valve and incentive spirometer.  Will hold on systemic steroids for now.   Paroxysmal atrial fibrillation (HCC) Continue rate control with carvedilol and anticoagulation with apixaban. Continue telemetry monitoring.   Liver cirrhosis (HCC) Continue supportive medical therapy with lactulose and rifaximin.   Essential  hypertension Continue blood pressure control with carvedilol.  Patient with poor mobility, uses wheelchair at home.   Hypothyroidism Continue levothyroxine.   Anemia of chronic disease Iron deficiency anemia  Cell count has been stable at 7.8 and 7.7/  Sp IV iron infusion.   Dyslipidemia Patient with statin intolerance.   Acute renal failure superimposed on stage 3b chronic kidney disease (HCC) Hyponatremia.   Renal function with serum cr at 1,68 with K at 4,4 and serum bicarbonate at 32  Na 134  Follow up renal function in am.   GERD (gastroesophageal reflux disease) Continue with PPI   Obesity, class 2 Calculated BMI is 35.9   Malignant neoplasm of upper-outer quadrant of left breast in female, estrogen receptor positive (HCC) Follow up as outpatient, will check outpatient records.         Subjective: Patient with fatigue and cough, dyspnea and edema have been improving.   Physical Exam: Vitals:   11/29/23 1215 11/29/23 1233 11/29/23 1606 11/29/23 1659  BP: 112/70  126/61   Pulse: 60     Resp: 20  20   Temp: 97.7 F (36.5 C)  97.7 F (36.5 C)   TempSrc: Oral  Oral   SpO2: 96% 96%  96%  Weight:      Height:       Neurology awake and alert, deconditioned  ENT with mild pallor with no icterus Cardiovascular with S1 and S2 present with no gallops, rubs or murmurs No JVD Trace non pitting lower extremity edema, some lymphedema Respiratory with bilateral rhonchi, with scattered rales with no wheezing Abdomen with no distention  Data Reviewed:    Family Communication: I spoke with patient's son at the bedside, we talked in detail about patient's condition, plan of care and prognosis and all questions were addressed.   Disposition: Status  is: Inpatient Remains inpatient appropriate because: recovering heart failure and RSV.   Planned Discharge Destination: Skilled nursing facility    Author: Coralie Keens, MD 11/29/2023 5:39 PM  For on  call review [email or web address].

## 2023-11-29 NOTE — Assessment & Plan Note (Addendum)
Acute viral bronchitis.  Continue bronchodilator therapy, antitussive agents and airway clearing techniques with flutter valve and incentive spirometer.  Patient had a short course of systemic steroids.   02 saturation today 96% on room air, today.

## 2023-11-29 NOTE — Hospital Course (Addendum)
Mrs. Katelyn Jackson was admitted to the hospital with the working diagnosis of heart failure in the setting of RSV infection.   [name] w hypertension, hyperlipidemia, atrial fibrillation, anemia, GERD, hypothyroidism, pacemaker, CKD 3B, obesity, NASH cirrhosis, sCHF, chronic respiratory failure on home O2 breast cancer, lymphedema presented to ED with worsening shortness of breath and cough, symptoms started few days prior, EMS called for respiratory distress, placed on BiPAP.  In the emergency room her blood pressure was 102/43, HR 65, RR 25 and 02 saturation 100% on Bipap, she had increased work of breathing, lungs with wheezing and rales bilaterally, heart with S1 and S2 present and regular with no gallops, abdomen with no distention and positive lower extremity edema.   Na 138, K 3,8 Cl 106 bicarbonate 25 glucose 106 bun 18 cr 1,56  BNP 1,051  High sensitive troponin 33 and 39  Wbc 2,8 hgb 6,9 plt 176   COVID 19 negative Influenza negative  RSV positive  Blood cultures with only one bottle positive for staphylococcus hominis   Chest radiograph with cardiomegaly, with bilateral hilar vascular congestion, increase interstitial marking lower lobes more left than right, pacemaker in place with one lead right atrium and one lead right ventricle.   Patient was placed on IV diuresis and supportive medical care.  Her symptoms and volume has improved.   01/23 improved respiratory status, but continue very weak and deconditioned,  Continue to have constipation,   01/24 positive bowel movement, feeling much better today. Plan for transfer to SNF, continue oral diuretic therapy with torsemide.

## 2023-11-29 NOTE — Assessment & Plan Note (Signed)
Calculated BMI is 35.9

## 2023-11-29 NOTE — Assessment & Plan Note (Signed)
diagnosed 02/2017, s/p left lumpectomy on 04/04/17. -Oncotype RS 18, intermediate risk, adjuvant chemo ultimately not recommended. -S/p radiation 06/06/17 - 07/19/17 with Dr. Mitzi Hansen -She started Exemestane in 08/2017, changed to tamoxifen in 09/2021 due to osteoporosis  -most recent mammogram 06/01/22 was negative. -tamoxifen discontinued 06/2022 due to progressive pancytopenia. No plans to restart given she was close to completing 5 years Plan for continue follow up mammogram as outpatient

## 2023-11-29 NOTE — Assessment & Plan Note (Signed)
Continue blood pressure control with carvedilol.  Patient with poor mobility, uses wheelchair at home.

## 2023-11-29 NOTE — Progress Notes (Signed)
Physical Therapy Treatment Patient Details Name: Katelyn Jackson MRN: [id number] DOB: 11-Oct-1946 Today's Date: 11/29/2023   History of Present Illness Pt is a 78 y.o. female presenting 1/17 from home with shortness of breath. CXR concerning for edema vs PNA. PMH significant for afib on Eliquis s/p PPM, hypothyroidism, pacemaker, CKD IIIb, NASH cirrhosis, HTN, HLD, CHF, breast cancer, anemia, lymphedema.    PT Comments  Pt admitted with above diagnosis. Pt was able to ambulate with RW with +2 min assist and cues with definite need of chair follow as pt flexes knees as she fatigues and needs to sit frequently. Pt called son [name] to discuss home and discussed how [name] will not be there at all times. He stated pt will have to get in and out of bed on her own and pt cannot do so currently. She also needs assist at times with sit to stand and pivoting. Due to slow progress and weakness, recommend post acute Rehab < 3 hours day.  Pt and son agree. Updated SW/CM.   Pt currently with functional limitations due to the deficits listed below (see PT Problem List). Pt will benefit from acute skilled PT to increase their independence and safety with mobility to allow discharge.       If plan is discharge home, recommend the following: A little help with walking and/or transfers;A little help with bathing/dressing/bathroom;Assistance with cooking/housework;Assist for transportation;Help with stairs or ramp for entrance   Can travel by private vehicle        Equipment Recommendations  None recommended by PT;Other (comment) (Home O2)    Recommendations for Other Services       Precautions / Restrictions Precautions Precautions: Fall;Other (comment) Precaution Comments: Pain from L clavicular and patellar fx last year; DROPLET; CONTACT Restrictions Weight Bearing Restrictions Per Provider Order: No     Mobility  Bed Mobility Overal bed mobility: Needs Assistance Bed Mobility: Supine to Sit      Supine to sit: Min assist, HOB elevated Sit to supine: Mod assist, HOB elevated   General bed mobility comments: light hand held A throughout and assist to scoot hips out toward EOB. HOB elevated    Transfers Overall transfer level: Needs assistance Equipment used: Rolling walker (2 wheels) Transfers: Sit to/from Stand Sit to Stand: Min assist   Step pivot transfers: Contact guard assist       General transfer comment: for safety with STS and from lower surface must use momentum and needs more assist.    Ambulation/Gait Ambulation/Gait assistance: Contact guard assist Gait Distance (Feet): 20 Feet (20 feet, 30 feet, 15 feet) Assistive device: Rolling walker (2 wheels) Gait Pattern/deviations: Decreased step length - right, Decreased step length - left, Shuffle, Drifts right/left, Trunk flexed   Gait velocity interpretation: <1.31 ft/sec, indicative of household ambulator   General Gait Details: Pt was able to ambulate initially with CGA and no LOB with RW. As she fatigues, her bil LEs flex more and more and she is very unsafe with gait.  She is unaware and will lost balance if she ambulates too far.   Stairs             Wheelchair Mobility     Tilt Bed    Modified Rankin (Stroke Patients Only)       Balance Overall balance assessment: Mild deficits observed, not formally tested  Cognition Arousal: Alert Behavior During Therapy: WFL for tasks assessed/performed Overall Cognitive Status: Within Functional Limits for tasks assessed                                 General Comments: follows all one step commands, prefers russian language for history taking and more in depth conversations but able to communicate in [place] throughout session. discussed d/c with son who provided info regarding needs for home via phone as well as chatted with pt and PT about session.        Exercises       General Comments General comments (skin integrity, edema, etc.): VSS on 1LO2 on arrival. Removed O2 and pt needed at end of session as O2 would not pick up with incr activity.  >90% on 1LO2.      Pertinent Vitals/Pain Pain Assessment Pain Assessment: Faces Faces Pain Scale: Hurts little more Consolability: no need to console Pain Location: L knee, Bil quads cramping Pain Descriptors / Indicators: Discomfort, Aching Pain Intervention(s): Limited activity within patient's tolerance, Monitored during session, Repositioned    Home Living                          Prior Function            PT Goals (current goals can now be found in the care plan section) Acute Rehab PT Goals Patient Stated Goal: to go home Progress towards PT goals: Progressing toward goals    Frequency    Min 1X/week      PT Plan      Co-evaluation              AM-PAC PT "6 Clicks" Mobility   Outcome Measure  Help needed turning from your back to your side while in a flat bed without using bedrails?: None Help needed moving from lying on your back to sitting on the side of a flat bed without using bedrails?: A Little Help needed moving to and from a bed to a chair (including a wheelchair)?: A Little Help needed standing up from a chair using your arms (e.g., wheelchair or bedside chair)?: A Little Help needed to walk in hospital room?: A Little Help needed climbing 3-5 steps with a railing? : A Lot 6 Click Score: 18    End of Session Equipment Utilized During Treatment: Gait belt;Oxygen Activity Tolerance: Patient limited by fatigue Patient left: with call bell/phone within reach;in chair;with nursing/sitter in room Nurse Communication: Mobility status PT Visit Diagnosis: Unsteadiness on feet (R26.81);Muscle weakness (generalized) (M62.81)     Time: 2841-3244 PT Time Calculation (min) (ACUTE ONLY): 27 min  Charges:    $Gait Training: 23-37 mins PT General Charges $$ ACUTE  PT VISIT: 1 Visit                     Octave Montrose M,PT Acute Rehab Services [phone]    Bevelyn Buckles 11/29/2023, 11:05 AM

## 2023-11-29 NOTE — Progress Notes (Signed)
Occupational Therapy Treatment Patient Details Name: Katelyn Jackson MRN: [id number] DOB: [birth year] Today's Date: 11/29/2023   History of present illness Pt is a 78 y.o. female presenting 1/17 from home with shortness of breath. CXR concerning for edema vs PNA. PMH significant for afib on Eliquis s/p PPM, hypothyroidism, pacemaker, CKD IIIb, NASH cirrhosis, HTN, HLD, CHF, breast cancer, anemia, lymphedema.   OT comments  Pt progressing toward established OT goals. Challenging endurance, activity tolerance, and strength. Pt ambulatory to sink with fair tolerance; encouraged grooming while standing, but pt asking to perform seated. Pt using IS with supervision and cues for optimal technique. Would benefit from further education. Continue to recommend HHOT as long as son able to provide assist as needed at home.       If plan is discharge home, recommend the following:  A lot of help with walking and/or transfers;A lot of help with bathing/dressing/bathroom;Assistance with cooking/housework;Assist for transportation;Help with stairs or ramp for entrance   Equipment Recommendations  None recommended by OT    Recommendations for Other Services      Precautions / Restrictions Precautions Precautions: Fall;Other (comment) Precaution Comments: Pain from L clavicular and patellar fx last year; DROPLET; CONTACT Restrictions Weight Bearing Restrictions Per Provider Order: No       Mobility Bed Mobility Overal bed mobility: Needs Assistance Bed Mobility: Supine to Sit     Supine to sit: Min assist, HOB elevated     General bed mobility comments: light hand held A throughout and assist to scoot hips out toward EOB. HOB elevated    Transfers Overall transfer level: Needs assistance Equipment used: Rolling walker (2 wheels) Transfers: Sit to/from Stand Sit to Stand: Contact guard assist           General transfer comment: for safety with STS     Balance Overall balance  assessment: Mild deficits observed, not formally tested                                         ADL either performed or assessed with clinical judgement   ADL Overall ADL's : Needs assistance/impaired Eating/Feeding: Set up;Sitting   Grooming: Wash/dry face;Set up;Sitting Grooming Details (indicate cue type and reason): ambulatory to sink but then asking to sit for grooming tasks                 Toilet Transfer: Contact guard assist;Rolling walker (2 wheels);Ambulation Toilet Transfer Details (indicate cue type and reason): close guard, no physical assist needed this session         Functional mobility during ADLs: Contact guard assist;Rolling walker (2 wheels)      Extremity/Trunk Assessment              Vision       Perception     Praxis      Cognition Arousal: Alert Behavior During Therapy: WFL for tasks assessed/performed Overall Cognitive Status: Within Functional Limits for tasks assessed                                          Exercises      Shoulder Instructions       General Comments VSS on 1L supplementary O2. Pt with 2/4 DOE with ambulation to sink.    Pertinent Vitals/ Pain  Pain Assessment Pain Assessment: Faces Faces Pain Scale: Hurts little more Pain Location: L knee, Bil quads cramping Pain Descriptors / Indicators: Discomfort, Aching Pain Intervention(s): Limited activity within patient's tolerance, Monitored during session  Home Living                                          Prior Functioning/Environment              Frequency  Min 1X/week        Progress Toward Goals  OT Goals(current goals can now be found in the care plan section)  Progress towards OT goals: Progressing toward goals  Acute Rehab OT Goals Patient Stated Goal: less muscle cramps and coughing OT Goal Formulation: With patient Time For Goal Achievement: 12/11/23 Potential to Achieve  Goals: Good ADL Goals Pt Will Perform Grooming: with min assist;standing Pt Will Perform Upper Body Dressing: with set-up;sitting Pt Will Transfer to Toilet: with supervision;bedside commode;ambulating;stand pivot transfer  Plan      Co-evaluation                 AM-PAC OT "6 Clicks" Daily Activity     Outcome Measure   Help from another person eating meals?: None Help from another person taking care of personal grooming?: A Little Help from another person toileting, which includes using toliet, bedpan, or urinal?: A Lot Help from another person bathing (including washing, rinsing, drying)?: A Lot Help from another person to put on and taking off regular upper body clothing?: A Lot Help from another person to put on and taking off regular lower body clothing?: Total 6 Click Score: 14    End of Session Equipment Utilized During Treatment: Gait belt;Rolling walker (2 wheels)  OT Visit Diagnosis: Unsteadiness on feet (R26.81);Muscle weakness (generalized) (M62.81);Pain Pain - Right/Left: Left Pain - part of body: Knee;Shoulder   Activity Tolerance Patient tolerated treatment well   Patient Left in chair;with call bell/phone within reach   Nurse Communication Mobility status        Time: 6962-9528 OT Time Calculation (min): 21 min  Charges: OT General Charges $OT Visit: 1 Visit OT Treatments $Self Care/Home Management : 8-22 mins  Katelyn Jackson, OTR/L [facility] Acute Rehabilitation Office: [phone]   Katelyn Jackson 11/29/2023, 9:08 AM

## 2023-11-29 NOTE — Assessment & Plan Note (Signed)
Continue supportive medical therapy with lactulose and rifaximin.

## 2023-11-29 NOTE — TOC Progression Note (Addendum)
Transition of Care [facility]) - Progression Note    Patient Details  Name: Katelyn Jackson MRN: [id number] Date of Birth: 1946-08-21  Transition of Care [facility]) CM/SW Contact  Michaela Corner, [place] Phone Number: 11/29/2023, 11:55 AM  Clinical Narrative:   CSW spoke with pts son ([name]) about updated PT recs for SNF. [name] states his mother previously went [name], but also gave CSW permission to fax out other referrals.   12:24PM: CSW met pt at bedside to talk about PT recs for SNF. Pt called her son on the phone, as she stated "he speaks better english." Son confirmed with his mother that she is okay with going to short term rehab at this time.   2:13PM: CSW contacted [facility] admissions. They will review pt referral.   2:45PM: [name] informed CSW that pt will be in her copay days with insurance. CSW informed pts son, Jeb Levering, that pt is now in copay days for SNF (she will have to pay a copay while at short term rehab). [name] inquired about how much the copay would be and CSW explained that he would need to call the insurance company number on the back of pts insurance card.   [name] infomed CSW that pts copays with [facility] are $214/day. CSW informed pts son Jeb Levering of estimated copay.   4:16PM: Per [name], Medicaid will cover pts copays for SNF. CSW left VM for pts son regarding above information.   TOC will continue to follow.     Expected Discharge Plan: Skilled Nursing Facility Barriers to Discharge: Continued Medical Work up, SNF Pending bed offer, English as a second language teacher  Expected Discharge Plan and Services In-house Referral: NA Discharge Planning Services: CM Consult Post Acute Care Choice: Home Health, Resumption of Svcs/PTA Provider Living arrangements for the past 2 months: Single Family Home                 DME Arranged: N/A DME Agency: NA       HH Arranged: RN, PT, OT HH Agency: [facility] Date [facility] Agency  Contacted: 11/28/23 Time HH Agency Contacted: 1723 Representative spoke with at [facility] Agency: Kandee Keen   Social Determinants of Health (SDOH) Interventions SDOH Screenings   Food Insecurity: No Food Insecurity (11/24/2023)  Housing: Low Risk  (11/24/2023)  Transportation Needs: No Transportation Needs (11/24/2023)  Utilities: Not At Risk (11/24/2023)  Alcohol Screen: Low Risk  (06/15/2022)  Depression (PHQ2-9): Low Risk  (04/17/2023)  Financial Resource Strain: Low Risk  (06/15/2022)  Physical Activity: Inactive (06/15/2022)  Social Connections: Socially Isolated (11/24/2023)  Stress: No Stress Concern Present (06/15/2022)  Tobacco Use: Low Risk  (11/24/2023)    Readmission Risk Interventions    11/28/2023    5:18 PM 07/06/2021   11:49 AM 03/19/2021    3:03 PM  Readmission Risk Prevention Plan  Transportation Screening Complete Complete Complete  HRI or Home Care Consult   Complete  Social Work Consult for Recovery Care Planning/Counseling   Complete  Palliative Care Screening   Not Applicable  Medication Review Oceanographer) Complete  Complete  HRI or Home Care Consult Complete Complete   SW Recovery Care/Counseling Consult  Complete   Palliative Care Screening Not Applicable Not Applicable   Skilled Nursing Facility  Complete

## 2023-11-29 NOTE — Assessment & Plan Note (Addendum)
Continue rate control with carvedilol and anticoagulation with apixaban.

## 2023-11-29 NOTE — Plan of Care (Signed)

## 2023-11-29 NOTE — Assessment & Plan Note (Addendum)
Echocardiogram with preserved LV systolic function EF 40 to 45%, global hypokinesis, mild to moderate LV cavity dilatation, RV systolic function with moderate reduction, RV with moderate enlargement, RVSP 36.7 mmHg, LA with severe dilatation, possible PFO with left to right shunting, RA with severe dilatation, TR mild to moderate,   Acute on chronic core pulmonale.  Pulmonary hypertension   Patient was placed on IV furosemide for diuresis, negative fluid balance was achieved, -9,640 ml, with significant improvement in her symptoms.   Plan to continue with carvedilol and spironolactone.  Loop diuretic with torsemide 40 mg po daily.

## 2023-11-29 NOTE — Assessment & Plan Note (Signed)
-   Continue with PPI 

## 2023-11-29 NOTE — Assessment & Plan Note (Addendum)
Iron deficiency anemia  Cell count has been stable at 7.8 and 7.7/  Sp IV iron infusion.

## 2023-11-29 NOTE — Assessment & Plan Note (Signed)
Continue levothyroxine 

## 2023-11-29 NOTE — Assessment & Plan Note (Addendum)
Hyponatremia.   Renal function stable with serum cr at 1,62 with K at 4,5 and serum bicarbonate at 23  Na 137 Mg 2,5   Continue torsemide and follow up renal function and electrolytes in 7 days as outpatient.

## 2023-11-29 NOTE — [place] FL2 (Signed)
[place] MEDICAID FL2 LEVEL OF CARE FORM     IDENTIFICATION  Patient Name: Katelyn Jackson Birthdate: 08/24/1946 Sex: female Admission Date (Current Location): 11/24/2023  [facility] and [place] Number:  Producer, television/film/video and Address:  [facility] [place], [address], [place], [place] [address]      Provider Number: [id number]  Attending Physician Name and Address:  Coralie Keens  Relative Name and Phone Number:       Current Level of Care: Hospital Recommended Level of Care: Skilled Nursing Facility Prior Approval Number:    Date Approved/Denied:   PASRR Number: [id number]  Discharge Plan: SNF    Current Diagnoses: Patient Active Problem List   Diagnosis Date Noted   Paroxysmal atrial fibrillation (HCC) 11/29/2023   Acute renal failure superimposed on stage 3b chronic kidney disease (HCC) 11/24/2023   Acute on chronic systolic CHF (congestive heart failure) (HCC) 11/24/2023   RSV (respiratory syncytial virus pneumonia) 11/24/2023   Acute respiratory failure with hypoxia (HCC) 11/24/2023   Statin intolerance 11/02/2023   Stroke-like symptoms 08/20/2023   Closed fracture of left clavicle 06/21/2023   Dehydration 06/10/2023   Constipation 06/09/2023   Foley catheter in place prior to arrival 06/09/2023   Increased endometrial stripe thickness 06/09/2023   Closed patellar sleeve fracture of left knee 05/20/2023   Fall 05/18/2023   Patella fracture 05/18/2023   Clavicle fracture 05/18/2023   Abnormal urinalysis 05/18/2023   History of breast cancer 05/18/2023   Intertrigo 04/17/2023   Skin lesion 03/08/2023   Chronic systolic CHF (congestive heart failure) (HCC) 12/16/2022   Callus 11/04/2022   Cardiac pacemaker in situ 12/10/2021   Morbid obesity with BMI of 45.0-49.9, adult (HCC) 12/10/2021   Breast lesion 12/07/2021   Dry skin 09/22/2021   Colonization with VRE (vancomycin-resistant enterococcus) 09/22/2021    Osteoarthritis of right knee 08/19/2021   Herpes labialis 08/19/2021   Liver cirrhosis (HCC) 07/22/2021   Abscess    Prosthetic joint infection (HCC)    Abscess of right shoulder 07/21/2021   History of pacemaker 07/21/2021   Pressure injury of skin 07/03/2021   Hypothyroidism    Cellulitis    Postoperative anemia due to acute blood loss 03/19/2021   Dyspnea 03/19/2021   Symptomatic anemia 03/19/2021   Status post right shoulder hemiarthroplasty 03/17/2021   Pacemaker reprogramming/check 01/06/2021   Bradycardia 01/05/2021   CAP (community acquired pneumonia) 01/05/2021   CKD stage 3b, GFR 30-44 ml/min (HCC) 01/05/2021   Bilateral pain of leg and foot 10/08/2020   Abscess of left thumb 06/17/2020   Cellulitis and abscess of left leg 06/17/2020   Herpes zoster 02/18/2020   GERD (gastroesophageal reflux disease) 11/25/2019   Wheezing 10/26/2017   Sciatica of left side 08/29/2017   Obesity, class 2 07/11/2017   Hyperglycemia 07/11/2017   Low back pain 06/19/2017   Hypokalemia 06/19/2017   Anemia of chronic disease 02/16/2017   Malignant neoplasm of upper-outer quadrant of left breast in female, estrogen receptor positive (HCC) 02/10/2017   Essential hypertension 04/05/2016   Dyslipidemia 04/05/2016   Chronic atrial fibrillation (HCC) 04/05/2016   Pulmonary hypertension (HCC) 04/05/2016   Lymphedema 04/05/2016    Orientation RESPIRATION BLADDER Height & Weight     Self, Time, Situation, Place  O2 (1L O2 [place]) External catheter, Incontinent Weight: 190 lb 4.1 oz (86.3 kg) Height:  5\' 1"  (154.9 cm)  BEHAVIORAL SYMPTOMS/MOOD NEUROLOGICAL BOWEL NUTRITION STATUS      Incontinent Diet (See dc summary)  AMBULATORY  STATUS COMMUNICATION OF NEEDS Skin   Extensive Assist Verbally Normal                       Personal Care Assistance Level of Assistance  Bathing, Feeding, Dressing Bathing Assistance: Limited assistance Feeding assistance: Limited assistance Dressing  Assistance: Limited assistance     Functional Limitations Info  Sight, Hearing, Speech Sight Info: Adequate Hearing Info: Adequate Speech Info: Adequate    SPECIAL CARE FACTORS FREQUENCY  PT (By licensed PT), OT (By licensed OT)     PT Frequency: 5x week OT Frequency: 5x week            Contractures Contractures Info: Not present    Additional Factors Info  Code Status, Allergies Code Status Info: Full Allergies Info: Hibiclens (chlorhexidine Gluconate), Mevacor (lovastatin), Penicillins           Current Medications (11/29/2023):  This is the current hospital active medication list Current Facility-Administered Medications  Medication Dose Route Frequency Provider Last Rate Last Admin   acetaminophen (TYLENOL) tablet 650 mg  650 mg Oral Q6H PRN Synetta Fail, MD   650 mg at 11/29/23 1610   Or   acetaminophen (TYLENOL) suppository 650 mg  650 mg Rectal Q6H PRN Synetta Fail, MD       albuterol (PROVENTIL) (2.5 MG/3ML) 0.083% nebulizer solution 2.5 mg  2.5 mg Nebulization Q4H PRN Synetta Fail, MD   2.5 mg at 11/28/23 9604   allopurinol (ZYLOPRIM) tablet 50 mg  50 mg Oral Daily Synetta Fail, MD   50 mg at 11/29/23 5409   apixaban (ELIQUIS) tablet 5 mg  5 mg Oral BID Synetta Fail, MD   5 mg at 11/29/23 0816   carvedilol (COREG) tablet 12.5 mg  12.5 mg Oral BID WC Synetta Fail, MD   12.5 mg at 11/29/23 0817   feeding supplement (ENSURE ENLIVE / ENSURE PLUS) liquid 30 mL  30 mL Oral BID Synetta Fail, MD   30 mL at 11/28/23 2110   guaiFENesin (MUCINEX) 12 hr tablet 600 mg  600 mg Oral BID Zannie Cove, MD   600 mg at 11/29/23 0817   ipratropium-albuterol (DUONEB) 0.5-2.5 (3) MG/3ML nebulizer solution 3 mL  3 mL Nebulization QID Zannie Cove, MD   3 mL at 11/29/23 0724   lactulose (CHRONULAC) 10 GM/15ML solution 10 g  10 g Oral BID Zannie Cove, MD   10 g at 11/29/23 8119   levothyroxine (SYNTHROID) tablet 50 mcg  50 mcg  Oral QAC breakfast Synetta Fail, MD   50 mcg at 11/29/23 0507   pantoprazole (PROTONIX) EC tablet 40 mg  40 mg Oral Daily Synetta Fail, MD   40 mg at 11/29/23 0816   polyethylene glycol (MIRALAX / GLYCOLAX) packet 17 g  17 g Oral Daily PRN Synetta Fail, MD       predniSONE (DELTASONE) tablet 40 mg  40 mg Oral Q breakfast Synetta Fail, MD   40 mg at 11/29/23 0816   rifaximin (XIFAXAN) tablet 550 mg  550 mg Oral BID Synetta Fail, MD   550 mg at 11/29/23 0816   sodium chloride flush (NS) 0.9 % injection 3 mL  3 mL Intravenous Q12H Synetta Fail, MD   3 mL at 11/29/23 1478   spironolactone (ALDACTONE) tablet 25 mg  25 mg Oral Daily Synetta Fail, MD   25 mg at 11/29/23 0816   traMADol Janean Sark) tablet  50 mg  50 mg Oral Q6H PRN Synetta Fail, MD   50 mg at 11/29/23 7829     Discharge Medications: Please see discharge summary for a list of discharge medications.  Relevant Imaging Results:  Relevant Lab Results:   Additional Information SSN: [id number]  Michaela Corner, LCSWA

## 2023-11-30 DIAGNOSIS — R058 Other specified cough: Secondary | ICD-10-CM | POA: Insufficient documentation

## 2023-11-30 DIAGNOSIS — I10 Essential (primary) hypertension: Secondary | ICD-10-CM | POA: Diagnosis not present

## 2023-11-30 DIAGNOSIS — D509 Iron deficiency anemia, unspecified: Secondary | ICD-10-CM | POA: Insufficient documentation

## 2023-11-30 DIAGNOSIS — I480 Paroxysmal atrial fibrillation: Secondary | ICD-10-CM | POA: Diagnosis not present

## 2023-11-30 DIAGNOSIS — J121 Respiratory syncytial virus pneumonia: Secondary | ICD-10-CM | POA: Diagnosis not present

## 2023-11-30 DIAGNOSIS — I130 Hypertensive heart and chronic kidney disease with heart failure and stage 1 through stage 4 chronic kidney disease, or unspecified chronic kidney disease: Secondary | ICD-10-CM | POA: Insufficient documentation

## 2023-11-30 DIAGNOSIS — R079 Chest pain, unspecified: Secondary | ICD-10-CM | POA: Insufficient documentation

## 2023-11-30 DIAGNOSIS — I5023 Acute on chronic systolic (congestive) heart failure: Secondary | ICD-10-CM | POA: Diagnosis not present

## 2023-11-30 LAB — HEMOGLOBIN AND HEMATOCRIT, BLOOD
HCT: 28.5 % — ABNORMAL LOW (ref 36.0–46.0)
Hemoglobin: 8.2 g/dL — ABNORMAL LOW (ref 12.0–15.0)

## 2023-11-30 LAB — BASIC METABOLIC PANEL
Anion gap: 7 (ref 5–15)
BUN: 32 mg/dL — ABNORMAL HIGH (ref 8–23)
CO2: 28 mmol/L (ref 22–32)
Calcium: 9.6 mg/dL (ref 8.9–10.3)
Chloride: 102 mmol/L (ref 98–111)
Creatinine, Ser: 1.62 mg/dL — ABNORMAL HIGH (ref 0.44–1.00)
GFR, Estimated: 33 mL/min — ABNORMAL LOW (ref >60)
Glucose, Bld: 131 mg/dL — ABNORMAL HIGH (ref 70–99)
Potassium: 4.5 mmol/L (ref 3.5–5.1)
Sodium: 137 mmol/L (ref 135–145)

## 2023-11-30 LAB — MAGNESIUM: Magnesium: 2.5 mg/dL — ABNORMAL HIGH (ref 1.7–2.4)

## 2023-11-30 MED ORDER — TORSEMIDE 20 MG PO TABS
40.0000 mg | ORAL_TABLET | Freq: Every day | ORAL | Status: DC
  Administered 2023-11-30 – 2023-12-01 (×2): 40 mg via ORAL
  Filled 2023-11-30 (×2): qty 2

## 2023-11-30 MED ORDER — LEVOTHYROXINE SODIUM 50 MCG PO TABS
50.0000 ug | ORAL_TABLET | Freq: Every day | ORAL | Status: DC
  Administered 2023-11-30 – 2023-12-01 (×2): 50 ug via ORAL
  Filled 2023-11-30 (×2): qty 1

## 2023-11-30 MED ORDER — POLYETHYLENE GLYCOL 3350 17 G PO PACK
17.0000 g | PACK | Freq: Two times a day (BID) | ORAL | Status: DC
  Administered 2023-11-30 – 2023-12-01 (×3): 17 g via ORAL
  Filled 2023-11-30 (×3): qty 1

## 2023-11-30 NOTE — Progress Notes (Signed)
Progress Note   Patient: Katelyn Jackson OZH:[id number] DOB: [birth year] DOA: 11/24/2023     5 DOS: the patient was seen and examined on 11/30/2023   Brief hospital course: [name] w hypertension, hyperlipidemia, atrial fibrillation, anemia, GERD, hypothyroidism, pacemaker, CKD 3B, obesity, NASH cirrhosis, sCHF, chronic respiratory failure on home O2 breast cancer, lymphedema presented to ED with worsening shortness of breath and cough, symptoms started few days prior, EMS called for respiratory distress, placed on BiPAP.  In the ED hypertensive, tachypneic weaned off BiPAP to nasal cannula, creatinine 1.6, WBC 2.8, hemoglobin 6.9 from baseline of 7-8, BNP 1051, troponin 33, 39, RSV positive, FOBT negative, chest x-ray with interstitial markings concerning for edema versus pneumonia. -Improving with diuretics, steroids, pulmonary toilet  01/23 improved respiratory status, but continue very weak and deconditioned,  Continue to have constipation,   Assessment and Plan: Acute on chronic systolic CHF (congestive heart failure) (HCC) Echocardiogram with preserved LV systolic function EF 40 to 45%, global hypokinesis, mild to moderate LV cavity dilatation, RV systolic function with moderate reduction, RV with moderate enlargement, RVSP 36.7 mmHg, LA with severe dilatation, possible PFO with left to right shunting, RA with severe dilatation, TR mild to moderate,   Urine output 1,475 ml Systolic blood pressure 115 mmHg range.   Plan to continue with carvedilol and spironolactone.  Will transition to po loop diuretic, with torsemide 40 mg po daily.   RSV (respiratory syncytial virus pneumonia) Acute viral bronchitis.  Continue bronchodilator therapy, antitussive agents and airway clearing techniques with flutter valve and incentive spirometer.  Continue to  hold on systemic steroids for now. 02 saturation today 95% on room air,   Paroxysmal atrial fibrillation (HCC) Continue rate control with  carvedilol and anticoagulation with apixaban. Continue telemetry monitoring.   Liver cirrhosis (HCC) Continue supportive medical therapy with lactulose and rifaximin.   Essential hypertension Continue blood pressure control with carvedilol.  Patient with poor mobility, uses wheelchair at home.   Hypothyroidism Continue levothyroxine.   Anemia of chronic disease Iron deficiency anemia  Cell count has been stable at 7.8 and 7.7/  Sp IV iron infusion.   Dyslipidemia Patient with statin intolerance.   Acute renal failure superimposed on stage 3b chronic kidney disease (HCC) Hyponatremia.   Renal function stable with serum cr at 1,62 with K at 4,5 and serum bicarbonate at 23  Na 137 Mg 2,5  Resume oral loop diuretic therapy with torsemide, follow up renal function in am.   GERD (gastroesophageal reflux disease) Continue with PPI   Obesity, class 2 Calculated BMI is 35.9   Malignant neoplasm of upper-outer quadrant of left breast in female, estrogen receptor positive (HCC) diagnosed 02/2017, s/p left lumpectomy on 04/04/17. -Oncotype RS 18, intermediate risk, adjuvant chemo ultimately not recommended. -S/p radiation 06/06/17 - 07/19/17 with Dr. Mitzi Hansen -She started Exemestane in 08/2017, changed to tamoxifen in 09/2021 due to osteoporosis  -most recent mammogram 06/01/22 was negative. -tamoxifen discontinued 06/2022 due to progressive pancytopenia. No plans to restart given she was close to completing 5 years Plan for continue follow up mammogram as outpatient     Subjective: Patient is feeling better from her respiratory status, cough has improved, but she continue very weak and deconditioned. Continue to have constipation   Physical Exam: Vitals:   11/30/23 0021 11/30/23 0416 11/30/23 0815 11/30/23 0817  BP: (!) 124/54 (!) 106/51 (!) 123/50   Pulse:  63 67   Resp: 20 20    Temp: 98.1 F (36.7 C) 98.1 F (36.7  C)    TempSrc: Oral Oral    SpO2: 100% 100%  100%  Weight:   86.8 kg    Height:       Neurology awake and alert ENT with mild pallor with no icterus Cardiovascular with S1 and S2 present and regular with no gallops, rubs or murmurs Respiratory with scattered rhonchi with no wheezing or rales  Abdomen with no distention  Lower extremity edema + with signs of lymphedema, non pitting edema  Data Reviewed:    Family Communication: no family at the bedside   Disposition: Status is: Inpatient Remains inpatient appropriate because: recovering from RSV, pending transfer to SNF   Planned Discharge Destination: Skilled nursing facility      Author: Coralie Keens, MD 11/30/2023 9:52 AM  For on call review [email or web address].

## 2023-11-30 NOTE — Plan of Care (Signed)

## 2023-11-30 NOTE — Plan of Care (Signed)
  Problem: Activity: Goal: Risk for activity intolerance will decrease Outcome: Progressing   Problem: Nutrition: Goal: Adequate nutrition will be maintained Outcome: Progressing   Problem: Activity: Goal: Capacity to carry out activities will improve Outcome: Progressing

## 2023-11-30 NOTE — TOC Progression Note (Addendum)
Transition of Care [facility]) - Progression Note    Patient Details  Name: Katelyn Jackson MRN: [id number] Date of Birth: [birth year]  [facility] CM/SW Contact  Michaela Corner, [place] Phone Number: 11/30/2023, 10:13 AM  Clinical Narrative:   CSW spoke with [name] (pts son) about pt going to [facility] for short term rehab. [name] pt in the [facility] and informed CSW that medicaid will pay for pts copays. CSW relayed information to Jeb Levering and he stated that he will speak to his mother about still going to short term rehab. CSW asked [name] what the plan for DC would be if his mother does not want to go to SNF, [name] states he would take her home. [name] stated he will speak with his mother and updated CSW.  3:10PM: CSW spoke with [name] about [name] for placement. [name] gave CSW to proceed with process for placement. CSW explained insurance auth process to [employer] and he gave his verbal understanding. CSW will submit for auth.   4:23PM: Auth id [id number]; Ins auth approved 12/01/2023-12/05/2023  TOC will continue to follow.   Expected Discharge Plan: Skilled Nursing Facility Barriers to Discharge: Continued Medical Work up, SNF Pending bed offer, English as a second language teacher  Expected Discharge Plan and Services In-house Referral: NA Discharge Planning Services: CM Consult Post Acute Care Choice: Home Health, Resumption of Svcs/PTA Provider Living arrangements for the past 2 months: Single Family Home                 DME Arranged: N/A DME Agency: NA       HH Arranged: RN, PT, OT HH Agency: [facility] Date [facility] Agency Contacted: 11/28/23 Time HH Agency Contacted: 1723 Representative spoke with at [facility] Agency: Kandee Keen   Social Determinants of Health (SDOH) Interventions SDOH Screenings   Food Insecurity: No Food Insecurity (11/24/2023)  Housing: Low Risk  (11/24/2023)  Transportation Needs: No Transportation Needs (11/24/2023)  Utilities: Not At Risk  (11/24/2023)  Alcohol Screen: Low Risk  (06/15/2022)  Depression (PHQ2-9): Low Risk  (04/17/2023)  Financial Resource Strain: Low Risk  (06/15/2022)  Physical Activity: Inactive (06/15/2022)  Social Connections: Socially Isolated (11/24/2023)  Stress: No Stress Concern Present (06/15/2022)  Tobacco Use: Low Risk  (11/24/2023)    Readmission Risk Interventions    11/28/2023    5:18 PM 07/06/2021   11:49 AM 03/19/2021    3:03 PM  Readmission Risk Prevention Plan  Transportation Screening Complete Complete Complete  HRI or Home Care Consult   Complete  Social Work Consult for Recovery Care Planning/Counseling   Complete  Palliative Care Screening   Not Applicable  Medication Review Oceanographer) Complete  Complete  HRI or Home Care Consult Complete Complete   SW Recovery Care/Counseling Consult  Complete   Palliative Care Screening Not Applicable Not Applicable   Skilled Nursing Facility  Complete

## 2023-11-30 NOTE — Progress Notes (Signed)
Mobility Specialist Progress Note:   11/30/23 1345  Mobility  Activity Ambulated with assistance in room  Level of Assistance Contact guard assist, steadying assist  Assistive Device Front wheel walker  Distance Ambulated (ft) 25 ft  Activity Response Tolerated well  Mobility Referral Yes  Mobility visit 1 Mobility  Mobility Specialist Start Time (ACUTE ONLY) 1345  Mobility Specialist Stop Time (ACUTE ONLY) 1402  Mobility Specialist Time Calculation (min) (ACUTE ONLY) 17 min   Pt agreeable to mobility session. Required only contact assist for safety with ambulation in room. Distance limited d/t no second person for chair follow. SpO2 >90% on RA throughout session, pt received on RA as well. Back in chair with all needs met.   Addison Lank Mobility Specialist Please contact via SecureChat or  Rehab office at [phone]

## 2023-11-30 NOTE — Plan of Care (Signed)

## 2023-12-01 DIAGNOSIS — I5023 Acute on chronic systolic (congestive) heart failure: Secondary | ICD-10-CM | POA: Diagnosis not present

## 2023-12-01 DIAGNOSIS — J121 Respiratory syncytial virus pneumonia: Secondary | ICD-10-CM | POA: Diagnosis not present

## 2023-12-01 DIAGNOSIS — N179 Acute kidney failure, unspecified: Secondary | ICD-10-CM | POA: Diagnosis not present

## 2023-12-01 DIAGNOSIS — E871 Hypo-osmolality and hyponatremia: Secondary | ICD-10-CM | POA: Insufficient documentation

## 2023-12-01 DIAGNOSIS — I2781 Cor pulmonale (chronic): Secondary | ICD-10-CM | POA: Insufficient documentation

## 2023-12-01 DIAGNOSIS — I10 Essential (primary) hypertension: Secondary | ICD-10-CM | POA: Diagnosis not present

## 2023-12-01 MED ORDER — GUAIFENESIN-DM 100-10 MG/5ML PO SYRP: 5.0000 mL | ORAL_SOLUTION | Freq: Four times a day (QID) | ORAL | 0 refills | Status: DC | PRN

## 2023-12-01 MED ORDER — IPRATROPIUM-ALBUTEROL 0.5-2.5 (3) MG/3ML IN SOLN: 3.0000 mL | Freq: Four times a day (QID) | RESPIRATORY_TRACT | 0 refills | Status: DC | PRN

## 2023-12-01 MED ORDER — POLYETHYLENE GLYCOL 3350 17 G PO PACK: 17.0000 g | PACK | Freq: Two times a day (BID) | ORAL | 0 refills | Status: AC

## 2023-12-01 MED ORDER — TRAMADOL HCL 50 MG PO TABS: 50.0000 mg | ORAL_TABLET | Freq: Four times a day (QID) | ORAL | 0 refills | Status: DC | PRN

## 2023-12-01 MED ORDER — IPRATROPIUM-ALBUTEROL 0.5-2.5 (3) MG/3ML IN SOLN
3.0000 mL | Freq: Three times a day (TID) | RESPIRATORY_TRACT | Status: DC
  Administered 2023-12-01: 3 mL via RESPIRATORY_TRACT
  Filled 2023-12-01: qty 3

## 2023-12-01 MED ORDER — ENSURE ENLIVE PO LIQD: 30.0000 mL | Freq: Two times a day (BID) | ORAL | 0 refills | Status: AC

## 2023-12-01 MED ORDER — TORSEMIDE 40 MG PO TABS: 40.0000 mg | ORAL_TABLET | Freq: Every day | ORAL | 0 refills | Status: DC

## 2023-12-01 NOTE — Discharge Summary (Signed)
Physician Discharge Summary   Patient: Katelyn Jackson MRN: [id number] DOB: [birth year]  Admit date:     11/24/2023  Discharge date: 12/01/23  Discharge Physician: York Ram Zoye Chandra   PCP: Tresa Garter, MD   Recommendations at discharge:    Continue diuresis with torsemide 40 mg daily. Added bronchodilator therapy, continue antitussive agents as needed and every 6 hrs while awake airway clearing techniques with flutter valve and incentive spirometer.  Follow up renal function and electrolytes in 7 days Follow up with Dr Posey Rea in 7 to 10 days  I spoke with patient's son over the phone, we talked in detail about patient's condition, plan of care and prognosis and all questions were addressed.   Discharge Diagnoses: Active Problems:   Acute on chronic systolic CHF (congestive heart failure) (HCC)   RSV (respiratory syncytial virus pneumonia)   Paroxysmal atrial fibrillation (HCC)   Essential hypertension   Liver cirrhosis (HCC)   Hypothyroidism   Anemia of chronic disease   Dyslipidemia   Acute renal failure superimposed on stage 3b chronic kidney disease (HCC)   GERD (gastroesophageal reflux disease)   Obesity, class 2   Malignant neoplasm of upper-outer quadrant of left breast in female, estrogen receptor positive (HCC)  Resolved Problems:   * No resolved hospital problems. [facility] Course: Katelyn Jackson was admitted to the hospital with the working diagnosis of heart failure in the setting of RSV infection.   [name] w hypertension, hyperlipidemia, atrial fibrillation, anemia, GERD, hypothyroidism, pacemaker, CKD 3B, obesity, NASH cirrhosis, sCHF, chronic respiratory failure on home O2 breast cancer, lymphedema presented to ED with worsening shortness of breath and cough, symptoms started few days prior, EMS called for respiratory distress, placed on BiPAP.  In the emergency room her blood pressure was 102/43, HR 65, RR 25 and 02 saturation 100% on Bipap,  she had increased work of breathing, lungs with wheezing and rales bilaterally, heart with S1 and S2 present and regular with no gallops, abdomen with no distention and positive lower extremity edema.   Na 138, K 3,8 Cl 106 bicarbonate 25 glucose 106 bun 18 cr 1,56  BNP 1,051  High sensitive troponin 33 and 39  Wbc 2,8 hgb 6,9 plt 176   COVID 19 negative Influenza negative  RSV positive  Blood cultures with only one bottle positive for staphylococcus hominis   Chest radiograph with cardiomegaly, with bilateral hilar vascular congestion, increase interstitial marking lower lobes more left than right, pacemaker in place with one lead right atrium and one lead right ventricle.   Patient was placed on IV diuresis and supportive medical care.  Her symptoms and volume has improved.   01/23 improved respiratory status, but continue very weak and deconditioned,  Continue to have constipation,   01/24 positive bowel movement, feeling much better today. Plan for transfer to SNF, continue oral diuretic therapy with torsemide.   Assessment and Plan: Acute on chronic systolic CHF (congestive heart failure) (HCC) Echocardiogram with preserved LV systolic function EF 40 to 45%, global hypokinesis, mild to moderate LV cavity dilatation, RV systolic function with moderate reduction, RV with moderate enlargement, RVSP 36.7 mmHg, LA with severe dilatation, possible PFO with left to right shunting, RA with severe dilatation, TR mild to moderate,   Acute on chronic core pulmonale.  Pulmonary hypertension   Patient was placed on IV furosemide for diuresis, negative fluid balance was achieved, -9,640 ml, with significant improvement in her symptoms.   Plan to continue with carvedilol and spironolactone.  Loop diuretic with torsemide 40 mg po daily.   RSV (respiratory syncytial virus pneumonia) Acute viral bronchitis.  Continue bronchodilator therapy, antitussive agents and airway clearing techniques  with flutter valve and incentive spirometer.  Patient had a short course of systemic steroids.   02 saturation today 96% on room air, today.   Acute renal failure superimposed on stage 3b chronic kidney disease (HCC) Hyponatremia.   Renal function stable with serum cr at 1,62 with K at 4,5 and serum bicarbonate at 23  Na 137 Mg 2,5   Continue torsemide and follow up renal function and electrolytes in 7 days as outpatient.   Essential hypertension Continue blood pressure control with carvedilol.  Patient with poor mobility, uses wheelchair at home.   Paroxysmal atrial fibrillation (HCC) Continue rate control with carvedilol and anticoagulation with apixaban.  Anemia of chronic disease Iron deficiency anemia  Cell count has been stable at 7.8 and 7.7/  Sp IV iron infusion.   Dyslipidemia Patient with statin intolerance.   Liver cirrhosis (HCC) Continue supportive medical therapy with lactulose and rifaximin.   GERD (gastroesophageal reflux disease) Continue with PPI   Hypothyroidism Continue levothyroxine.   Obesity, class 2 Calculated BMI is 35.9   Malignant neoplasm of upper-outer quadrant of left breast in female, estrogen receptor positive (HCC) diagnosed 02/2017, s/p left lumpectomy on 04/04/17. -Oncotype RS 18, intermediate risk, adjuvant chemo ultimately not recommended. -S/p radiation 06/06/17 - 07/19/17 with Dr. Mitzi Hansen -She started Exemestane in 08/2017, changed to tamoxifen in 09/2021 due to osteoporosis  -most recent mammogram 06/01/22 was negative. -tamoxifen discontinued 06/2022 due to progressive pancytopenia. No plans to restart given she was close to completing 5 years Plan for continue follow up mammogram as outpatient      Consultants: none  Procedures performed: none   Disposition: Skilled nursing facility Diet recommendation:  Cardiac diet DISCHARGE MEDICATION: Allergies as of 12/01/2023       Reactions   Hibiclens [chlorhexidine Gluconate] Hives    Mevacor [lovastatin] Other (See Comments)   Myalgias    Penicillins Itching   Tolerated Cephalosporin Date: 03/17/21. Td Ancef 2g 07/26/21        Medication List     STOP taking these medications    ezetimibe 10 MG tablet Commonly known as: ZETIA   potassium chloride 8 MEQ tablet Commonly known as: KLOR-CON   protein supplement Liqd Replaced by: feeding supplement Liqd       TAKE these medications    acetaminophen 500 MG tablet Commonly known as: TYLENOL Take 1,000 mg by mouth at bedtime.   allopurinol 100 MG tablet Commonly known as: ZYLOPRIM Take 0.5 tablets (50 mg total) by mouth daily.   apixaban 5 MG Tabs tablet Commonly known as: ELIQUIS Take 1 tablet (5 mg total) by mouth 2 (two) times daily.   carvedilol 12.5 MG tablet Commonly known as: COREG Take 1 tablet (12.5 mg total) by mouth 2 (two) times daily with a meal.   feeding supplement Liqd Take 30 mLs by mouth 2 (two) times daily. Replaces: protein supplement Liqd   guaiFENesin-dextromethorphan 100-10 MG/5ML syrup Commonly known as: ROBITUSSIN DM Take 5 mLs by mouth every 6 (six) hours as needed for cough.   ipratropium-albuterol 0.5-2.5 (3) MG/3ML Soln Commonly known as: DUONEB Take 3 mLs by nebulization every 6 (six) hours as needed.   lactulose 10 GM/15ML solution Commonly known as: CHRONULAC TAKE 15 MILLILITERS BY MOUTH THREE TIMESDAILY   levothyroxine 50 MCG tablet Commonly known as: SYNTHROID Take 1 tablet (  50 mcg total) by mouth daily before breakfast. Overdue for Annual appt must see provider for future refills What changed:  when to take this additional instructions   magnesium oxide 400 MG tablet Commonly known as: MAG-OX Take 1 tablet (400 mg total) by mouth 2 (two) times daily.   omeprazole 40 MG capsule Commonly known as: PRILOSEC Take 1 capsule (40 mg total) by mouth daily.   polyethylene glycol 17 g packet Commonly known as: MIRALAX / GLYCOLAX Take 17 g by mouth 2  (two) times daily.   rifaximin 550 MG Tabs tablet Commonly known as: XIFAXAN Take 550 mg by mouth 2 (two) times daily.   spironolactone 25 MG tablet Commonly known as: ALDACTONE Take 1 tablet (25 mg total) by mouth daily.   Torsemide 40 MG Tabs Take 40 mg by mouth daily. Start taking on: December 02, 2023 What changed:  medication strength how much to take   traMADol 50 MG tablet Commonly known as: ULTRAM Take 1 tablet (50 mg total) by mouth every 6 (six) hours as needed for severe pain (pain score 7-10). What changed: reasons to take this   triamcinolone cream 0.1 % Commonly known as: KENALOG Apply 1 Application topically 2 (two) times daily. To dry patches on BLE and bilateral feet               Durable Medical Equipment  (From admission, onward)           Start     Ordered   11/28/23 1718  For home use only DME Walker rolling  Once       Question Answer Comment  Walker: With 5 Inch Wheels   Patient needs a walker to treat with the following condition Weakness      11/28/23 1718            Follow-up Information     [facility] Follow up.   Specialty: Home Health Services Why: Agency will call you to set up apt times Contact information: [address] [place] [address] [phone]                Discharge Exam: Filed Weights   11/29/23 0423 11/30/23 0416 12/01/23 0300  Weight: 86.3 kg 86.8 kg 85.8 kg   BP (!) 95/40 (BP Location: Right Arm)   Pulse 60   Temp 98.6 F (37 C) (Oral)   Resp 20   Ht 5\' 1"  (1.549 m)   Wt 85.8 kg   LMP  (LMP Unknown)   SpO2 96%   BMI 35.74 kg/m   Patient is feeling better, no chest pain, no dyspnea, edema has improved, cough is better and she had a bowel movement yesterday   Neurology awake and alert ENT with mild pallor Cardiovascular with S1 and S2 present and regular with no gallops, systolic murmur at the right lower sternal border.  No JVD Lower extremity edema  trace, positive lymphedema Respiratory with no wheezing or rhonchi, mild rales at bases with poor inspiratory effort Abdomen with no distention, non tender  Condition at discharge: stable  The results of significant diagnostics from this hospitalization (including imaging, microbiology, ancillary and laboratory) are listed below for reference.   Imaging Studies: DG Chest Portable 1 View Result Date: 11/24/2023 CLINICAL DATA:  Shortness of breath.  COPD. EXAM: PORTABLE CHEST 1 VIEW COMPARISON:  08/20/2023. FINDINGS: Low lung volume. Redemonstration of diffuse increased interstitial markings, grossly similar to the prior study. Findings are nonspecific and differential  diagnosis include underlying interstitial lung disease, pulmonary edema or multilobar pneumonia. Correlate clinically. No acute dense consolidation or lung collapse. Bilateral costophrenic angles are clear. Stable moderately enlarged cardio-mediastinal silhouette. There is a left sided 2-lead pacemaker. No acute osseous abnormalities. The soft tissues are within normal limits. IMPRESSION: *Redemonstration of diffuse increased interstitial markings, as discussed above. Electronically Signed   By: Jules Schick M.D.   On: 11/24/2023 13:25    Microbiology: Results for orders placed or performed during the hospital encounter of 11/24/23  Blood culture (routine x 2)     Status: None   Collection Time: 11/24/23 10:44 AM   Specimen: BLOOD LEFT HAND  Result Value Ref Range Status   Specimen Description BLOOD LEFT HAND  Final   Special Requests   Final    BOTTLES DRAWN AEROBIC AND ANAEROBIC Blood Culture results may not be optimal due to an inadequate volume of blood received in culture bottles   Culture   Final    NO GROWTH 5 DAYS Performed at [facility] Lab, [address], [place], [place] [address]    Report Status 11/29/2023 FINAL  Final  Resp panel by RT-PCR (RSV, Flu A&B, Covid) Anterior Nasal Swab     Status: Abnormal    Collection Time: 11/24/23 10:44 AM   Specimen: Anterior Nasal Swab  Result Value Ref Range Status   SARS Coronavirus 2 by RT PCR NEGATIVE NEGATIVE Final   Influenza A by PCR NEGATIVE NEGATIVE Final   Influenza B by PCR NEGATIVE NEGATIVE Final    Comment: (NOTE) The Xpert Xpress SARS-CoV-2/FLU/RSV plus assay is intended as an aid in the diagnosis of influenza from Nasopharyngeal swab specimens and should not be used as a sole basis for treatment. Nasal washings and aspirates are unacceptable for Xpert Xpress SARS-CoV-2/FLU/RSV testing.  Fact Sheet for Patients: BloggerCourse.com  Fact Sheet for Healthcare Providers: SeriousBroker.it  This test is not yet approved or cleared by the [place] FDA and has been authorized for detection and/or diagnosis of SARS-CoV-2 by FDA under an Emergency Use Authorization (EUA). This EUA will remain in effect (meaning this test can be used) for the duration of the COVID-19 declaration under Section 564(b)(1) of the Act, 21 U.S.C. section 360bbb-3(b)(1), unless the authorization is terminated or revoked.     Resp Syncytial Virus by PCR POSITIVE (A) NEGATIVE Final    Comment: (NOTE) Fact Sheet for Patients: BloggerCourse.com  Fact Sheet for Healthcare Providers: SeriousBroker.it  This test is not yet approved or cleared by the [place] FDA and has been authorized for detection and/or diagnosis of SARS-CoV-2 by FDA under an Emergency Use Authorization (EUA). This EUA will remain in effect (meaning this test can be used) for the duration of the COVID-19 declaration under Section 564(b)(1) of the Act, 21 U.S.C. section 360bbb-3(b)(1), unless the authorization is terminated or revoked.  Performed at [facility], [address], [place], [place] [address]   Blood culture (routine x 2)     Status: Abnormal   Collection Time:  11/24/23 12:41 PM   Specimen: BLOOD LEFT HAND  Result Value Ref Range Status   Specimen Description BLOOD LEFT HAND  Final   Special Requests   Final    AEROBIC BOTTLE ONLY Blood Culture results may not be optimal due to an inadequate volume of blood received in culture bottles   Culture  Setup Time   Final    GRAM POSITIVE COCCI AEROBIC BOTTLE ONLY CRITICAL RESULT CALLED TO, READ BACK  BY AND VERIFIED WITH: PHARMD JESSICA MILLEN [id number] AT 1512 BY EC    Culture (A)  Final    STAPHYLOCOCCUS HOMINIS THE SIGNIFICANCE OF ISOLATING THIS ORGANISM FROM A SINGLE SET OF BLOOD CULTURES WHEN MULTIPLE SETS ARE DRAWN IS UNCERTAIN. PLEASE NOTIFY THE MICROBIOLOGY DEPARTMENT WITHIN ONE WEEK IF SPECIATION AND SENSITIVITIES ARE REQUIRED. Performed at [facility], [address], [place], [place] [address]    Report Status 11/27/2023 FINAL  Final  Blood Culture ID Panel (Reflexed)     Status: Abnormal   Collection Time: 11/24/23 12:41 PM  Result Value Ref Range Status   Enterococcus faecalis NOT DETECTED NOT DETECTED Final   Enterococcus Faecium NOT DETECTED NOT DETECTED Final   Listeria monocytogenes NOT DETECTED NOT DETECTED Final   Staphylococcus species DETECTED (A) NOT DETECTED Final    Comment: CRITICAL RESULT CALLED TO, READ BACK BY AND VERIFIED WITH: PHARMD JESSICA MILLEN [id number] AT 1512 BY EC    Staphylococcus aureus (BCID) NOT DETECTED NOT DETECTED Final   Staphylococcus epidermidis NOT DETECTED NOT DETECTED Final   Staphylococcus lugdunensis NOT DETECTED NOT DETECTED Final   Streptococcus species NOT DETECTED NOT DETECTED Final   Streptococcus agalactiae NOT DETECTED NOT DETECTED Final   Streptococcus pneumoniae NOT DETECTED NOT DETECTED Final   Streptococcus pyogenes NOT DETECTED NOT DETECTED Final   A.calcoaceticus-baumannii NOT DETECTED NOT DETECTED Final   Bacteroides fragilis NOT DETECTED NOT DETECTED Final   Enterobacterales NOT DETECTED NOT DETECTED Final   Enterobacter  cloacae complex NOT DETECTED NOT DETECTED Final   Escherichia coli NOT DETECTED NOT DETECTED Final   Klebsiella aerogenes NOT DETECTED NOT DETECTED Final   Klebsiella oxytoca NOT DETECTED NOT DETECTED Final   Klebsiella pneumoniae NOT DETECTED NOT DETECTED Final   Proteus species NOT DETECTED NOT DETECTED Final   Salmonella species NOT DETECTED NOT DETECTED Final   Serratia marcescens NOT DETECTED NOT DETECTED Final   Haemophilus influenzae NOT DETECTED NOT DETECTED Final   Neisseria meningitidis NOT DETECTED NOT DETECTED Final   Pseudomonas aeruginosa NOT DETECTED NOT DETECTED Final   Stenotrophomonas maltophilia NOT DETECTED NOT DETECTED Final   Candida albicans NOT DETECTED NOT DETECTED Final   Candida auris NOT DETECTED NOT DETECTED Final   Candida glabrata NOT DETECTED NOT DETECTED Final   Candida krusei NOT DETECTED NOT DETECTED Final   Candida parapsilosis NOT DETECTED NOT DETECTED Final   Candida tropicalis NOT DETECTED NOT DETECTED Final   Cryptococcus neoformans/gattii NOT DETECTED NOT DETECTED Final    Comment: Performed at [facility], [address], [place], [place] [address]    Labs: CBC: Recent Labs  Lab 11/25/23 0225 11/26/23 0229 11/28/23 0238 11/29/23 0232 11/30/23 0242  WBC 2.0* 3.2* 4.0 4.1  --   HGB 7.6* 7.6* 7.7* 7.8* 8.2*  HCT 26.9* 27.3* 27.2* 27.8* 28.5*  MCV 81.0 81.0 80.2 81.5  --   PLT 149* 149* 165 155  --    Basic Metabolic Panel: Recent Labs  Lab 11/24/23 1521 11/25/23 0225 11/26/23 0229 11/27/23 0243 11/28/23 0238 11/29/23 0232 11/30/23 0242  NA  --    < > 136 136 137 134* 137  K  --    < > 3.1* 4.3 4.2 4.4 4.5  CL  --    < > 100 99 99 98 102  CO2  --    < > 28 30 27 29 28   GLUCOSE  --    < > 117* 95 116* 130* 131*  BUN  --    < >  24* 33* 34* 32* 32*  CREATININE  --    < > 1.51* 1.80* 1.80* 1.68* 1.62*  CALCIUM  --    < > 8.7* 9.0 9.4 9.5 9.6  MG 2.5*  --   --   --   --   --  2.5*   < > = values in this interval not  displayed.   Liver Function Tests: Recent Labs  Lab 11/25/23 0225  AST 20  ALT 13  ALKPHOS 132*  BILITOT 1.1  PROT 6.5  ALBUMIN 3.1*   CBG: No results for input(s): "GLUCAP" in the last 168 hours.  Discharge time spent: greater than 30 minutes.  Signed: Coralie Keens, MD [facility] 12/01/2023

## 2023-12-01 NOTE — Progress Notes (Signed)
Mobility Specialist Progress Note:   12/01/23 0950  Mobility  Activity Transferred from bed to chair  Level of Assistance Contact guard assist, steadying assist  Assistive Device Front wheel walker  Distance Ambulated (ft) 5 ft  Activity Response Tolerated well  Mobility Referral Yes  Mobility visit 1 Mobility  Mobility Specialist Start Time (ACUTE ONLY) 0950  Mobility Specialist Stop Time (ACUTE ONLY) 1000  Mobility Specialist Time Calculation (min) (ACUTE ONLY) 10 min   Pt agreeable to mobility session. C/o poor sleep last night. Able to transfer to chair with contact assist and RW. Continues to display bilateral knee weakness. Left with all needs met.  Katelyn Jackson Mobility Specialist Please contact via SecureChat or  Rehab office at [phone]

## 2023-12-01 NOTE — TOC Transition Note (Signed)
Transition of Care [facility]) - Discharge Note   Patient Details  Name: Katelyn Jackson MRN: [id number] Date of Birth: February 12, 1946  [facility]) CM/SW Contact:  Michaela Corner, LCSWA Phone Number: 12/01/2023, 1:36 PM   Clinical Narrative:   [name]  Anticipated DC date: 12/01/2023 Family notified: [name] (son) Transport by: Sharin Mons   Per MD patient ready for DC to [facility]. RN to call report prior to discharge [phone]; room 412a). RN, patient, patient's family, and facility notified of DC. Discharge Summary and FL2 sent to facility. DC packet on chart. Ambulance transport requested for patient.   CSW will sign off for now as social work intervention is no longer needed. Please consult [place] again if new needs arise.      Final next level of care: Skilled Nursing Facility Barriers to Discharge: Barriers Resolved   Patient Goals and CMS Choice Patient states their goals for this hospitalization and ongoing recovery are:: return home with son CMS Medicare.gov Compare Post Acute Care list provided to:: Patient Represenative (must comment) Choice offered to / list presented to : Adult Children      Discharge Placement              [name] Patient to be transferred to facility by: [facility] Name of family member notified: Jeb Levering (son) Patient and family notified of of transfer: 12/01/23  Discharge Plan and Services Additional resources added to the After Visit Summary for   In-house Referral: NA Discharge Planning Services: CM Consult Post Acute Care Choice: Home Health, Resumption of Svcs/PTA Provider          DME Arranged: N/A DME Agency: NA       HH Arranged: RN, PT, OT HH Agency: [facility] Date [facility] Agency Contacted: 11/28/23 Time HH Agency Contacted: 1723 Representative spoke with at [facility] Agency: Kandee Keen  Social Drivers of Health (SDOH) Interventions SDOH Screenings   Food Insecurity: No  Food Insecurity (11/24/2023)  Housing: Low Risk  (11/24/2023)  Transportation Needs: No Transportation Needs (11/24/2023)  Utilities: Not At Risk (11/24/2023)  Alcohol Screen: Low Risk  (06/15/2022)  Depression (PHQ2-9): Low Risk  (04/17/2023)  Financial Resource Strain: Low Risk  (06/15/2022)  Physical Activity: Inactive (06/15/2022)  Social Connections: Socially Isolated (11/24/2023)  Stress: No Stress Concern Present (06/15/2022)  Tobacco Use: Low Risk  (11/24/2023)     Readmission Risk Interventions    11/28/2023    5:18 PM 07/06/2021   11:49 AM 03/19/2021    3:03 PM  Readmission Risk Prevention Plan  Transportation Screening Complete Complete Complete  HRI or Home Care Consult   Complete  Social Work Consult for Recovery Care Planning/Counseling   Complete  Palliative Care Screening   Not Applicable  Medication Review Oceanographer) Complete  Complete  HRI or Home Care Consult Complete Complete   SW Recovery Care/Counseling Consult  Complete   Palliative Care Screening Not Applicable Not Applicable   Skilled Nursing Facility  Complete

## 2023-12-01 NOTE — Care Management Important Message (Signed)
Important Message  Patient Details  Name: Katelyn Jackson MRN: [id number] Date of Birth: 1946-05-07   Important Message Given:  Yes - Medicare IM     Renie Ora 12/01/2023, 10:36 AM

## 2023-12-01 NOTE — Progress Notes (Signed)
Duonebs changed to TID per RT assessment, BBS essn clear though decd at bases. Pt w/ slnpc

## 2023-12-01 NOTE — Plan of Care (Signed)

## 2023-12-04 ENCOUNTER — Inpatient Hospital Stay (HOSPITAL_COMMUNITY)
Admission: RE | Admit: 2023-12-04 | Discharge: 2023-12-04 | Disposition: A | Discharge status: Home / Self Care | Payer: Medicare HMO | Source: Ambulatory Visit | Attending: Internal Medicine | Admitting: Internal Medicine

## 2023-12-11 ENCOUNTER — Ambulatory Visit (HOSPITAL_COMMUNITY)
Admission: RE | Admit: 2023-12-11 | Discharge: 2023-12-11 | Disposition: A | Discharge status: Home / Self Care | Payer: Medicare HMO | Source: Ambulatory Visit | Attending: Internal Medicine | Admitting: Internal Medicine

## 2023-12-11 DIAGNOSIS — N189 Chronic kidney disease, unspecified: Secondary | ICD-10-CM | POA: Diagnosis present

## 2023-12-11 DIAGNOSIS — D631 Anemia in chronic kidney disease: Secondary | ICD-10-CM | POA: Insufficient documentation

## 2023-12-11 MED ORDER — IRON SUCROSE 200 MG IVPB - SIMPLE MED
200.0000 mg | Status: DC
  Administered 2023-12-11: 200 mg via INTRAVENOUS
  Filled 2023-12-11: qty 200

## 2023-12-18 ENCOUNTER — Ambulatory Visit: Payer: Medicare HMO | Admitting: Internal Medicine

## 2023-12-18 ENCOUNTER — Encounter (HOSPITAL_COMMUNITY)
Admission: RE | Admit: 2023-12-18 | Discharge: 2023-12-18 | Disposition: A | Discharge status: Home / Self Care | Payer: Medicare HMO | Source: Ambulatory Visit | Attending: Internal Medicine | Admitting: Internal Medicine

## 2023-12-18 DIAGNOSIS — N189 Chronic kidney disease, unspecified: Secondary | ICD-10-CM | POA: Diagnosis present

## 2023-12-18 DIAGNOSIS — D631 Anemia in chronic kidney disease: Secondary | ICD-10-CM | POA: Insufficient documentation

## 2023-12-18 MED ORDER — IRON SUCROSE 200 MG IVPB - SIMPLE MED
200.0000 mg | Status: DC
  Administered 2023-12-18: 200 mg via INTRAVENOUS
  Filled 2023-12-18: qty 200

## 2023-12-25 ENCOUNTER — Encounter (HOSPITAL_COMMUNITY)
Admission: RE | Admit: 2023-12-25 | Discharge: 2023-12-25 | Disposition: A | Discharge status: Home / Self Care | Payer: Medicare HMO | Source: Ambulatory Visit | Attending: Internal Medicine | Admitting: Internal Medicine

## 2023-12-25 ENCOUNTER — Telehealth: Payer: Self-pay | Admitting: Internal Medicine

## 2023-12-25 DIAGNOSIS — N189 Chronic kidney disease, unspecified: Secondary | ICD-10-CM | POA: Diagnosis not present

## 2023-12-25 DIAGNOSIS — J4520 Mild intermittent asthma, uncomplicated: Secondary | ICD-10-CM

## 2023-12-25 MED ORDER — IRON SUCROSE 200 MG IVPB - SIMPLE MED
200.0000 mg | Status: DC
Start: 2023-12-25 — End: 2024-01-29
  Administered 2023-12-25: 200 mg via INTRAVENOUS
  Filled 2023-12-25: qty 200

## 2023-12-25 NOTE — Telephone Encounter (Unsigned)
Copied from CRM [id number]. Topic: Clinical - Home Health Verbal Orders >> Dec 25, 2023  8:27 AM Maxwell Marion wrote: Caller/Agency: Heather with [facility] Callback Number: [phone] Service Requested: Skilled Nursing Frequency: 1 week 4 Any new concerns about the patient? Once PT does their evaluation, they will call if they need orders. She would like to know if Dr. Posey Rea can call in Nebulizer to patient's pharmacy, [facility] because the rehab center gave her a nebulizer on discharge

## 2023-12-27 MED ORDER — IPRATROPIUM-ALBUTEROL 0.5-2.5 (3) MG/3ML IN SOLN: 3.0000 mL | Freq: Four times a day (QID) | RESPIRATORY_TRACT | 3 refills | Status: AC | PRN

## 2023-12-27 NOTE — Telephone Encounter (Signed)
Pt is needing a rx sent into her pharmacy for Nebulizer to [facility]

## 2023-12-27 NOTE — Telephone Encounter (Signed)
I was able to give the verbal [facility] for Skilled Nursing 1x a week for 4 weeks to [place] with [place].

## 2023-12-27 NOTE — Telephone Encounter (Signed)
OK OK  Thx

## 2023-12-28 NOTE — Addendum Note (Signed)
Addended by: Tresa Garter on: 12/28/2023 10:45 AM   Modules accepted: Orders

## 2023-12-28 NOTE — Telephone Encounter (Signed)
Rx faxed Thx

## 2023-12-29 ENCOUNTER — Ambulatory Visit: Payer: Medicare HMO | Admitting: Internal Medicine

## 2023-12-29 ENCOUNTER — Encounter: Payer: Self-pay | Admitting: Internal Medicine

## 2023-12-29 VITALS — BP 120/70 | HR 72 | Temp 98.0°F | Ht 61.0 in | Wt 168.0 lb

## 2023-12-29 DIAGNOSIS — D638 Anemia in other chronic diseases classified elsewhere: Secondary | ICD-10-CM

## 2023-12-29 DIAGNOSIS — J189 Pneumonia, unspecified organism: Secondary | ICD-10-CM | POA: Diagnosis not present

## 2023-12-29 DIAGNOSIS — I10 Essential (primary) hypertension: Secondary | ICD-10-CM | POA: Diagnosis not present

## 2023-12-29 DIAGNOSIS — G8929 Other chronic pain: Secondary | ICD-10-CM | POA: Diagnosis not present

## 2023-12-29 DIAGNOSIS — Z7901 Long term (current) use of anticoagulants: Secondary | ICD-10-CM

## 2023-12-29 DIAGNOSIS — M5442 Lumbago with sciatica, left side: Secondary | ICD-10-CM | POA: Diagnosis not present

## 2023-12-29 MED ORDER — AZITHROMYCIN 250 MG PO TABS: ORAL_TABLET | ORAL | 0 refills | Status: DC

## 2023-12-29 MED ORDER — CARVEDILOL 12.5 MG PO TABS: 12.5000 mg | ORAL_TABLET | Freq: Two times a day (BID) | ORAL | 3 refills | Status: AC

## 2023-12-29 MED ORDER — PROMETHAZINE-DM 6.25-15 MG/5ML PO SYRP: 5.0000 mL | ORAL_SOLUTION | Freq: Four times a day (QID) | ORAL | 1 refills | Status: AC | PRN

## 2023-12-29 MED ORDER — METHYLPREDNISOLONE 4 MG PO TBPK: ORAL_TABLET | ORAL | 0 refills | Status: DC

## 2023-12-29 NOTE — Progress Notes (Signed)
 Subjective:  Patient ID: Katelyn Jackson, female    DOB: 09/07/1946  Age: 78 y.o. MRN: [id number]  CC: Medical Management of Chronic Issues (Recent d/c from rehab)   HPI Katelyn Jackson presents for cough - post-Rehab stay for CAP, weakness, wheezing Here w/son  Outpatient Medications Prior to Visit  Medication Sig Dispense Refill   acetaminophen (TYLENOL) 500 MG tablet Take 1,000 mg by mouth at bedtime.     allopurinol (ZYLOPRIM) 100 MG tablet Take 0.5 tablets (50 mg total) by mouth daily. 90 tablet 1   apixaban (ELIQUIS) 5 MG TABS tablet Take 1 tablet (5 mg total) by mouth 2 (two) times daily. 180 tablet 3   benzonatate (TESSALON) 100 MG capsule Take 100 mg by mouth 3 (three) times daily as needed for cough.     ipratropium-albuterol (DUONEB) 0.5-2.5 (3) MG/3ML SOLN Take 3 mLs by nebulization every 6 (six) hours as needed. 270 mL 3   lactulose (CHRONULAC) 10 GM/15ML solution TAKE 15 MILLILITERS BY MOUTH THREE TIMESDAILY 946 mL 3   levothyroxine (SYNTHROID) 50 MCG tablet Take 1 tablet (50 mcg total) by mouth daily before breakfast. Overdue for Annual appt must see provider for future refills (Patient taking differently: Take 50 mcg by mouth See admin instructions. Take 1 tablet ( ) daily before breakfast on Saturday, Sunday, Monday only.) 90 tablet 3   magnesium oxide (MAG-OX) 400 MG tablet Take 1 tablet (400 mg total) by mouth 2 (two) times daily. 180 tablet 3   Menthol-Zinc Oxide (CALMOSEPTINE) 0.44-20.6 % OINT Apply topically.     omeprazole (PRILOSEC) 40 MG capsule Take 1 capsule (40 mg total) by mouth daily. 90 capsule 3   polyethylene glycol (MIRALAX / GLYCOLAX) 17 g packet Take 17 g by mouth 2 (two) times daily. 14 each 0   rifaximin (XIFAXAN) 550 MG TABS tablet Take 550 mg by mouth 2 (two) times daily.     spironolactone (ALDACTONE) 25 MG tablet Take 1 tablet (25 mg total) by mouth daily. 90 tablet 3   torsemide 40 MG TABS Take 40 mg by mouth daily. 30 tablet 0   traMADol  (ULTRAM) 50 MG tablet Take 1 tablet (50 mg total) by mouth every 6 (six) hours as needed for severe pain (pain score 7-10). 10 tablet 0   triamcinolone cream (KENALOG) 0.1 % Apply 1 Application topically 2 (two) times daily. To dry patches on BLE and bilateral feet 450 g 3   carvedilol (COREG) 12.5 MG tablet Take 1 tablet (12.5 mg total) by mouth 2 (two) times daily with a meal. 90 tablet 2   guaiFENesin-dextromethorphan (ROBITUSSIN DM) 100-10 MG/5ML syrup Take 5 mLs by mouth every 6 (six) hours as needed for cough. 118 mL 0   feeding supplement (ENSURE ENLIVE / ENSURE PLUS) LIQD Take 30 mLs by mouth 2 (two) times daily. (Patient not taking: Reported on 12/29/2023) 1800 mL 0   No facility-administered medications prior to visit.    ROS: Review of Systems  Constitutional:  Positive for fatigue. Negative for activity change, appetite change, chills and unexpected weight change.  HENT:  Negative for congestion, mouth sores and sinus pressure.   Eyes:  Negative for visual disturbance.  Respiratory:  Positive for cough, shortness of breath and wheezing. Negative for chest tightness.   Cardiovascular:  Positive for leg swelling.  Gastrointestinal:  Negative for abdominal pain and nausea.  Genitourinary:  Negative for difficulty urinating, frequency and vaginal pain.  Musculoskeletal:  Positive for back pain and gait problem.  Skin:  Negative for pallor and rash.  Neurological:  Negative for dizziness, tremors, weakness, numbness and headaches.  Psychiatric/Behavioral:  Negative for confusion, sleep disturbance and suicidal ideas. The patient is not nervous/anxious.     Objective:  BP 120/70   Pulse 72   Temp 98 F (36.7 C) (Oral)   Ht 5\' 1"  (1.549 m)   Wt 168 lb (76.2 kg)   LMP  (LMP Unknown)   SpO2 95%   BMI 31.74 kg/m   BP Readings from Last 3 Encounters:  01/01/24 (!) 144/74  12/29/23 120/70  12/25/23 128/65    Wt Readings from Last 3 Encounters:  12/29/23 168 lb (76.2 kg)   12/11/23 176 lb (79.8 kg)  12/01/23 189 lb 2.5 oz (85.8 kg)    Physical Exam Constitutional:      General: She is not in acute distress.    Appearance: She is well-developed. She is obese.  HENT:     Head: Normocephalic.     Right Ear: External ear normal.     Left Ear: External ear normal.     Nose: Nose normal.  Eyes:     General:        Right eye: No discharge.        Left eye: No discharge.     Conjunctiva/sclera: Conjunctivae normal.     Pupils: Pupils are equal, round, and reactive to light.  Neck:     Thyroid: No thyromegaly.     Vascular: No JVD.     Trachea: No tracheal deviation.  Cardiovascular:     Rate and Rhythm: Normal rate and regular rhythm.     Heart sounds: Normal heart sounds.  Pulmonary:     Effort: No respiratory distress.     Breath sounds: No stridor. No wheezing.  Abdominal:     General: Bowel sounds are normal. There is no distension.     Palpations: Abdomen is soft. There is no mass.     Tenderness: There is no abdominal tenderness. There is no guarding or rebound.  Musculoskeletal:        General: No tenderness.     Cervical back: Normal range of motion and neck supple. No rigidity.  Lymphadenopathy:     Cervical: No cervical adenopathy.  Skin:    Findings: No erythema or rash.  Neurological:     Cranial Nerves: No cranial nerve deficit.     Motor: No abnormal muscle tone.     Coordination: Coordination normal.     Deep Tendon Reflexes: Reflexes normal.  Psychiatric:        Behavior: Behavior normal.        Thought Content: Thought content normal.        Judgment: Judgment normal.   Lymphedema on both lower extremities   A total time of 45 minutes was spent preparing to see the patient, reviewing tests, x-rays, operative reports and other medical records.  Also, obtaining history and performing comprehensive physical exam.  Additionally, counseling the patient regarding the above listed issues.   Finally, documenting clinical  information in the health records, coordination of care, educating the patient. PCS form filled out.  Lab Results  Component Value Date   WBC 4.1 11/29/2023   HGB 8.2 (L) 11/30/2023   HCT 28.5 (L) 11/30/2023   PLT 155 11/29/2023   GLUCOSE 131 (H) 11/30/2023   CHOL 151 08/21/2023   TRIG 67 08/21/2023   HDL 63 08/21/2023   LDLCALC 75 08/21/2023   ALT 13 11/25/2023  AST 20 11/25/2023   NA 137 11/30/2023   K 4.5 11/30/2023   CL 102 11/30/2023   CREATININE 1.62 (H) 11/30/2023   BUN 32 (H) 11/30/2023   CO2 28 11/30/2023   TSH 1.75 11/02/2023   INR 1.4 (H) 08/20/2023   HGBA1C 5.5 11/02/2023    No results found.  Assessment & Plan:   Problem List Items Addressed This Visit     Essential hypertension (Chronic)   On Coreg, Torsemide      Relevant Medications   carvedilol (COREG) 12.5 MG tablet   Anemia of chronic disease   Labs w/Dr Lowell Guitar Chronic fatigue      Low back pain   Chronic back pain.  The patient needs assistance at home -- PCS Tramadol prn  Potential benefits of a long term opioids use as well as potential risks (i.e. addiction risk, apnea etc) and complications (i.e. Somnolence, constipation and others) were explained to the patient and were aknowledged.      Relevant Medications   methylPREDNISolone (MEDROL DOSEPAK) 4 MG TBPK tablet   Long term (current) use of anticoagulants   On Eliquis      CAP (community acquired pneumonia) - Primary   Recent Duoneb HHN QID Medrol pack Zpack Prometh cough syrup       Relevant Medications   promethazine-dextromethorphan (PROMETHAZINE-DM) 6.25-15 MG/5ML syrup   azithromycin (ZITHROMAX Z-PAK) 250 MG tablet      Meds ordered this encounter  Medications   carvedilol (COREG) 12.5 MG tablet    Sig: Take 1 tablet (12.5 mg total) by mouth 2 (two) times daily with a meal.    Dispense:  180 tablet    Refill:  3   promethazine-dextromethorphan (PROMETHAZINE-DM) 6.25-15 MG/5ML syrup    Sig: Take 5 mLs by mouth  4 (four) times daily as needed for cough.    Dispense:  240 mL    Refill:  1   methylPREDNISolone (MEDROL DOSEPAK) 4 MG TBPK tablet    Sig: As directed    Dispense:  21 tablet    Refill:  0   azithromycin (ZITHROMAX Z-PAK) 250 MG tablet    Sig: As directed    Dispense:  6 tablet    Refill:  0      Follow-up: Return in about 6 weeks (around 02/09/2024) for a follow-up visit.  Sonda Primes, MD

## 2023-12-29 NOTE — Assessment & Plan Note (Signed)
Recent Duoneb HHN QID Medrol pack Zpack Prometh cough syrup

## 2024-01-01 ENCOUNTER — Ambulatory Visit (HOSPITAL_COMMUNITY)
Admission: RE | Admit: 2024-01-01 | Discharge: 2024-01-01 | Disposition: A | Discharge status: Home / Self Care | Payer: Medicare HMO | Source: Ambulatory Visit | Attending: Internal Medicine | Admitting: Internal Medicine

## 2024-01-01 DIAGNOSIS — N189 Chronic kidney disease, unspecified: Secondary | ICD-10-CM | POA: Insufficient documentation

## 2024-01-01 DIAGNOSIS — D631 Anemia in chronic kidney disease: Secondary | ICD-10-CM | POA: Diagnosis present

## 2024-01-01 MED ORDER — IRON SUCROSE 200 MG IVPB - SIMPLE MED
200.0000 mg | Status: DC
Start: 2024-01-01 — End: 2024-02-05
  Administered 2024-01-01: 200 mg via INTRAVENOUS
  Filled 2024-01-01: qty 200

## 2024-01-02 ENCOUNTER — Telehealth: Payer: Self-pay

## 2024-01-02 NOTE — Telephone Encounter (Signed)
 Copied from CRM [id number]. Topic: Clinical - Home Health Verbal Orders >> Jan 02, 2024  8:34 AM Dollene Primrose wrote: Caller/Agency: Rhun[facility] Callback Number: [phone] Service Requested: OT Frequency: Once a week x2 weeks(2/23) Any new concerns about the patient? no

## 2024-01-03 ENCOUNTER — Other Ambulatory Visit: Payer: Self-pay | Admitting: Internal Medicine

## 2024-01-03 DIAGNOSIS — Z1231 Encounter for screening mammogram for malignant neoplasm of breast: Secondary | ICD-10-CM

## 2024-01-03 NOTE — Telephone Encounter (Signed)
 Okay.  Thanks.

## 2024-01-03 NOTE — Telephone Encounter (Signed)
 Verbal Okay given to [facility] for OT Frequency: Once a week x2 weeks(2/23)

## 2024-01-05 ENCOUNTER — Ambulatory Visit: Payer: Medicare HMO

## 2024-01-09 ENCOUNTER — Ambulatory Visit (INDEPENDENT_AMBULATORY_CARE_PROVIDER_SITE_OTHER): Payer: Medicare HMO | Admitting: Podiatry

## 2024-01-09 ENCOUNTER — Encounter: Payer: Self-pay | Admitting: Podiatry

## 2024-01-09 DIAGNOSIS — D2372 Other benign neoplasm of skin of left lower limb, including hip: Secondary | ICD-10-CM

## 2024-01-09 DIAGNOSIS — B351 Tinea unguium: Secondary | ICD-10-CM

## 2024-01-09 DIAGNOSIS — M79676 Pain in unspecified toe(s): Secondary | ICD-10-CM

## 2024-01-10 ENCOUNTER — Telehealth: Payer: Self-pay

## 2024-01-10 NOTE — Progress Notes (Signed)
 She presents today chief complaint of painfully elongated toenails and calluses.  She has her interpreter with her and is Russian-speaking.  Objective: Vital signs are stable alert oriented x 3.  Pulses are palpable.  Severe edema bilateral lower extremity.  Is mobile with wheelchair.  Pulses are palpable.  Toenails are extremely long.  Multiple benign skin lesions around her toes and forefoot.  No open lesions or wounds are noted.  Assessment: Pain limb secondary to onychomycosis and benign skin lesions.  Plan: Debridement of benign skin lesions and debridement of toenails 1 through 5 bilateral.  Reappoint to Dr. Collene Schlichter

## 2024-01-10 NOTE — Telephone Encounter (Signed)
 Copied from CRM [id number]. Topic: General - Other >> Jan 10, 2024  4:10 PM Almira Coaster wrote: Reason for CRM: [facility] is calling because the parameter for weight change notification is 171-180. Patient's weight on 01/04/2024 165, 01/03/2024 166, 01/02/2024 168.8. They would like to know if Dr.Plotnikov would like to change the parameter to 160-170.

## 2024-01-12 ENCOUNTER — Ambulatory Visit
Admission: RE | Admit: 2024-01-12 | Discharge: 2024-01-12 | Disposition: A | Discharge status: Home / Self Care | Payer: Medicare HMO | Source: Ambulatory Visit | Attending: Internal Medicine | Admitting: Internal Medicine

## 2024-01-12 DIAGNOSIS — Z1231 Encounter for screening mammogram for malignant neoplasm of breast: Secondary | ICD-10-CM

## 2024-01-12 HISTORY — DX: Personal history of antineoplastic chemotherapy: Z92.21

## 2024-01-12 NOTE — Telephone Encounter (Signed)
 Okay.  Thanks.

## 2024-01-13 DIAGNOSIS — N1832 Chronic kidney disease, stage 3b: Secondary | ICD-10-CM

## 2024-01-13 DIAGNOSIS — D509 Iron deficiency anemia, unspecified: Secondary | ICD-10-CM

## 2024-01-13 DIAGNOSIS — I4821 Permanent atrial fibrillation: Secondary | ICD-10-CM

## 2024-01-13 DIAGNOSIS — J9601 Acute respiratory failure with hypoxia: Secondary | ICD-10-CM

## 2024-01-13 DIAGNOSIS — I5023 Acute on chronic systolic (congestive) heart failure: Secondary | ICD-10-CM

## 2024-01-13 DIAGNOSIS — N179 Acute kidney failure, unspecified: Secondary | ICD-10-CM

## 2024-01-13 DIAGNOSIS — J121 Respiratory syncytial virus pneumonia: Secondary | ICD-10-CM | POA: Diagnosis not present

## 2024-01-13 DIAGNOSIS — I5032 Chronic diastolic (congestive) heart failure: Secondary | ICD-10-CM

## 2024-01-13 DIAGNOSIS — I130 Hypertensive heart and chronic kidney disease with heart failure and stage 1 through stage 4 chronic kidney disease, or unspecified chronic kidney disease: Secondary | ICD-10-CM | POA: Diagnosis not present

## 2024-01-13 DIAGNOSIS — G8929 Other chronic pain: Secondary | ICD-10-CM

## 2024-01-13 DIAGNOSIS — D631 Anemia in chronic kidney disease: Secondary | ICD-10-CM

## 2024-01-13 DIAGNOSIS — I272 Pulmonary hypertension, unspecified: Secondary | ICD-10-CM

## 2024-01-15 ENCOUNTER — Encounter: Payer: Self-pay | Admitting: Internal Medicine

## 2024-01-15 NOTE — Assessment & Plan Note (Signed)
On Coreg, Torsemide

## 2024-01-15 NOTE — Assessment & Plan Note (Addendum)
 On Eliquis

## 2024-01-15 NOTE — Assessment & Plan Note (Signed)
 Chronic back pain.  The patient needs assistance at home -- PCS Tramadol prn  Potential benefits of a long term opioids use as well as potential risks (i.e. addiction risk, apnea etc) and complications (i.e. Somnolence, constipation and others) were explained to the patient and were aknowledged.

## 2024-01-15 NOTE — Assessment & Plan Note (Signed)
 Labs w/Dr Lowell Guitar Chronic fatigue

## 2024-01-16 NOTE — Telephone Encounter (Signed)
 Copied from CRM [id number]. Topic: General - Other >> Jan 16, 2024 10:46 AM Sim Boast F wrote: Reason for CRM: Burna Mortimer from [facility] calling to request that the Client Coordination Note Report be sent back over to her today at [phone]. Looks like the fax was sent yesterday.

## 2024-01-24 ENCOUNTER — Encounter (HOSPITAL_BASED_OUTPATIENT_CLINIC_OR_DEPARTMENT_OTHER): Payer: Self-pay | Admitting: Emergency Medicine

## 2024-01-24 ENCOUNTER — Other Ambulatory Visit: Payer: Self-pay

## 2024-01-24 ENCOUNTER — Telehealth: Payer: Self-pay

## 2024-01-24 ENCOUNTER — Emergency Department (HOSPITAL_BASED_OUTPATIENT_CLINIC_OR_DEPARTMENT_OTHER)

## 2024-01-24 ENCOUNTER — Emergency Department (HOSPITAL_BASED_OUTPATIENT_CLINIC_OR_DEPARTMENT_OTHER)
Admission: EM | Admit: 2024-01-24 | Discharge: 2024-01-24 | Disposition: A | Discharge status: Home / Self Care | Attending: Emergency Medicine | Admitting: Emergency Medicine

## 2024-01-24 DIAGNOSIS — Z7901 Long term (current) use of anticoagulants: Secondary | ICD-10-CM | POA: Insufficient documentation

## 2024-01-24 DIAGNOSIS — W19XXXA Unspecified fall, initial encounter: Secondary | ICD-10-CM | POA: Diagnosis not present

## 2024-01-24 DIAGNOSIS — M545 Low back pain, unspecified: Secondary | ICD-10-CM | POA: Insufficient documentation

## 2024-01-24 DIAGNOSIS — Z853 Personal history of malignant neoplasm of breast: Secondary | ICD-10-CM | POA: Insufficient documentation

## 2024-01-24 DIAGNOSIS — M25562 Pain in left knee: Secondary | ICD-10-CM | POA: Insufficient documentation

## 2024-01-24 MED ORDER — LIDOCAINE 5 % EX PTCH
1.0000 | MEDICATED_PATCH | CUTANEOUS | Status: DC
  Administered 2024-01-24: 1 via TRANSDERMAL
  Filled 2024-01-24: qty 1

## 2024-01-24 MED ORDER — ACETAMINOPHEN 500 MG PO TABS
500.0000 mg | ORAL_TABLET | Freq: Once | ORAL | Status: AC
  Administered 2024-01-24: 500 mg via ORAL
  Filled 2024-01-24: qty 1

## 2024-01-24 NOTE — ED Provider Notes (Signed)
 [place] [facility] Provider Note   CSN: [id number] Arrival date & time: 01/24/24  1423     History  Chief Complaint  Patient presents with   Katelyn Jackson    Katelyn Jackson is a 78 y.o. female with a history of chronic atrial fibrillation, liver cirrhosis, and breast cancer who presents the ED today after a fall.  Patient's son at baseline reports that yesterday she was transferring from her wheelchair to her bed when she fell on the left side.  Denies LOC or head injury.  Patient reports falling onto her left side.  Endorses pain to the left knee and low back.  She uses a wheelchair for ambulation at baseline.  Denies weakness, headaches, vision changes, or sensation.  No additional complaints or concerns at this time.    Home Medications Prior to Admission medications   Medication Sig Start Date End Date Taking? Authorizing Provider  acetaminophen (TYLENOL) 500 MG tablet Take 1,000 mg by mouth at bedtime.    [provider]  allopurinol (ZYLOPRIM) 100 MG tablet Take 0.5 tablets (50 mg total) by mouth daily. 11/02/23   Plotnikov, Georgina Quint, MD  apixaban (ELIQUIS) 5 MG TABS tablet Take 1 tablet (5 mg total) by mouth 2 (two) times daily. 11/02/23   Plotnikov, Georgina Quint, MD  azithromycin (ZITHROMAX Z-PAK) 250 MG tablet As directed 12/29/23   Plotnikov, Georgina Quint, MD  carvedilol (COREG) 12.5 MG tablet Take 1 tablet (12.5 mg total) by mouth 2 (two) times daily with a meal. 12/29/23   Plotnikov, Georgina Quint, MD  ipratropium-albuterol (DUONEB) 0.5-2.5 (3) MG/3ML SOLN Take 3 mLs by nebulization every 6 (six) hours as needed. 12/27/23   Plotnikov, Georgina Quint, MD  lactulose (CHRONULAC) 10 GM/15ML solution TAKE 15 MILLILITERS BY MOUTH THREE TIMESDAILY 11/02/23   Plotnikov, Georgina Quint, MD  levothyroxine (SYNTHROID) 50 MCG tablet Take 1 tablet (50 mcg total) by mouth daily before breakfast. Overdue for Annual appt must see provider for future refills Patient taking  differently: Take 50 mcg by mouth See admin instructions. Take 1 tablet ( ) daily before breakfast on Saturday, Sunday, Monday only. 11/02/23   Plotnikov, Georgina Quint, MD  magnesium oxide (MAG-OX) 400 MG tablet Take 1 tablet (400 mg total) by mouth 2 (two) times daily. 11/02/23   Plotnikov, Georgina Quint, MD  Menthol-Zinc Oxide (CALMOSEPTINE) 0.44-20.6 % OINT Apply topically. 12/07/23   [provider]  methylPREDNISolone (MEDROL DOSEPAK) 4 MG TBPK tablet As directed 12/29/23   Plotnikov, Georgina Quint, MD  omeprazole (PRILOSEC) 40 MG capsule Take 1 capsule (40 mg total) by mouth daily. 11/02/23   Plotnikov, Georgina Quint, MD  polyethylene glycol (MIRALAX / GLYCOLAX) 17 g packet Take 17 g by mouth 2 (two) times daily. 12/01/23   Arrien, York Ram, MD  promethazine-dextromethorphan (PROMETHAZINE-DM) 6.25-15 MG/5ML syrup Take 5 mLs by mouth 4 (four) times daily as needed for cough. 12/29/23   Plotnikov, Georgina Quint, MD  rifaximin (XIFAXAN) 550 MG TABS tablet Take 550 mg by mouth 2 (two) times daily.    [provider]  spironolactone (ALDACTONE) 25 MG tablet Take 1 tablet (25 mg total) by mouth daily. 11/02/23   Plotnikov, Georgina Quint, MD  torsemide 40 MG TABS Take 40 mg by mouth daily. 12/02/23 01/01/24  Arrien, York Ram, MD  traMADol (ULTRAM) 50 MG tablet Take 1 tablet (50 mg total) by mouth every 6 (six) hours as needed for severe pain (pain score 7-10). 12/01/23   Arrien, York Ram, MD  triamcinolone cream (KENALOG) 0.1 % Apply 1 Application topically 2 (two) times daily. To dry patches on BLE and bilateral feet 11/02/23   Plotnikov, Georgina Quint, MD      Allergies    Chlorhexidine gluconate, Hibiclens [chlorhexidine gluconate], Mevacor [lovastatin], Penicillin g, and Penicillins    Review of Systems   Review of Systems  Musculoskeletal:        Knee pain  All other systems reviewed and are negative.   Physical Exam Updated Vital Signs BP 121/60 (BP Location: Left Arm)    Pulse 76   Temp 98.6 F (37 C)   Resp 18   Ht 5\' 1"  (1.549 m)   Wt 73.9 kg   LMP  (LMP Unknown)   SpO2 95%   BMI 30.80 kg/m  Physical Exam Vitals and nursing note reviewed.  Constitutional:      General: She is not in acute distress.    Appearance: Normal appearance.  HENT:     Head: Normocephalic and atraumatic.     Mouth/Throat:     Mouth: Mucous membranes are moist.  Eyes:     Conjunctiva/sclera: Conjunctivae normal.     Pupils: Pupils are equal, round, and reactive to light.  Cardiovascular:     Rate and Rhythm: Normal rate and regular rhythm.     Pulses: Normal pulses.     Heart sounds: Normal heart sounds.  Pulmonary:     Effort: Pulmonary effort is normal.     Breath sounds: Normal breath sounds.  Abdominal:     Palpations: Abdomen is soft.     Tenderness: There is no abdominal tenderness.  Musculoskeletal:        General: Tenderness present. Normal range of motion.     Cervical back: Normal range of motion. No tenderness.     Comments: Tenderness to palpation of the lumbar spine without step-off or deformity.  Tenderness to palpation of the left lateral low back as well as the anterior left knee.  Range of motion,strength, and sensation of upper and lower extremities intact bilaterally.    Skin:    General: Skin is warm and dry.     Findings: No rash.  Neurological:     General: No focal deficit present.     Mental Status: She is alert.     Sensory: No sensory deficit.     Motor: No weakness.  Psychiatric:        Mood and Affect: Mood normal.        Behavior: Behavior normal.    ED Results / Procedures / Treatments   Labs (all labs ordered are listed, but only abnormal results are displayed) Labs Reviewed - No data to display  EKG None  Radiology DG Hip Unilat W or Wo Pelvis 2-3 Views Left Result Date: 01/24/2024 CLINICAL DATA:  Left hip pain after fall EXAM: DG HIP (WITH OR WITHOUT PELVIS) 2-3V LEFT COMPARISON:  05/17/2023 FINDINGS: Frontal view of  the pelvis as well as frontal and frogleg lateral views of the left hip are obtained. No acute displaced fracture, subluxation, or dislocation. Stable symmetrical bilateral hip osteoarthritis. Stable lower lumbar degenerative change. Soft tissues are unremarkable. IMPRESSION: 1. Degenerative changes of the bilateral hips and lumbar spine, stable. 2. No acute displaced fracture. Electronically Signed   By: Sharlet Salina M.D.   On: 01/24/2024 17:00   DG Lumbar Spine Complete Result Date: 01/24/2024 CLINICAL DATA:  Low back pain after fall. EXAM: LUMBAR SPINE - COMPLETE 4+ VIEW COMPARISON:  June 14, 2023. FINDINGS: There is no evidence of lumbar spine fracture. Alignment is normal. Severe degenerative disc disease is noted at L3-4, L4-5 and L5-S1. IMPRESSION: Severe multilevel degenerative disc disease. No acute abnormality seen. Electronically Signed   By: Lupita Raider M.D.   On: 01/24/2024 17:00   DG Knee Complete 4 Views Left Result Date: 01/24/2024 CLINICAL DATA:  Larey Seat, left knee pain EXAM: LEFT KNEE - COMPLETE 4+ VIEW COMPARISON:  08/20/2023 FINDINGS: Frontal, bilateral oblique, lateral views of the left knee are obtained. Evaluation is limited by patient positioning and body habitus. No acute displaced fracture. Nonunion of prior lower pole patellar fracture again noted. Severe 3 compartmental osteoarthritis greatest in the medial and patellofemoral compartments. There is diffuse subcutaneous edema. No evidence of joint effusion. IMPRESSION: 1. No acute displaced fracture. 2. Severe 3 compartmental osteoarthritis unchanged. 3. 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 4. Diffuse soft tissue edema. Electronically Signed   By: Sharlet Salina M.D.   On: 01/24/2024 16:59    Procedures Procedures: not indicated.   Medications Ordered in ED Medications  lidocaine (LIDODERM) 5 % 1 patch (1 patch Transdermal Patch Applied 01/24/24 1740)  acetaminophen (TYLENOL) tablet 500 mg (500 mg Oral  Given 01/24/24 1740)    ED Course/ Medical Decision Making/ A&P                                 Medical Decision Making Amount and/or Complexity of Data Reviewed Radiology: ordered.   This patient presents to the ED for concern of fall, this involves an extensive number of treatment options, and is a complaint that carries with it a high risk of complications and morbidity.   Differential diagnosis includes: Fracture, dislocation, contusion, abrasion, laceration, hematoma, etc.   Comorbidities  See HPI above   Additional History  Additional history obtained from prior records    Imaging Studies  I ordered imaging studies including left knee, lumbar spine, and left hip x-rays I independently visualized and interpreted imaging which showed:  Severe multilevel degenerative disc disease.  No acute abnormality seen of the lumbar x-ray. Degenerative changes of the bilateral hips and lumbar spine, stable.  No acute displaced fracture of the hips. No acute displaced fracture of the left knee.  Severe 3 compartmental osteoarthritis unchanged.  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  Soft tissue edema present. I agree with the radiologist interpretation   Problem List / ED Course / Critical Interventions / Medication Management  Patient fell last night while transferring from her wheelchair to her recliner.  Landed on her left side.  Endorses pain to the left knee left lower back.  No head injury or LOC.  No neuro focal deficits.  Range of motion, strength, and sensation of upper and lower extremities intact bilaterally.  No deformities to the knee. I ordered medications including: Lidocaine patch for back pain and Tylenol given prior to discharge    Social Determinants of Health  Physical activity  Test / Admission - Considered  Discussed finding with patient and son at bedside.  All questions answered. She is hemodynamically stable and safe for discharge  home. Return precautions given.       Final Clinical Impression(s) / ED Diagnoses Final diagnoses:  Fall, initial encounter  Acute pain of left knee    Rx / DC Orders ED Discharge Orders     None  Maxwell Marion, PA-C 01/24/24 1750    Franne Forts, DO 01/25/24 [place]igg

## 2024-01-24 NOTE — Telephone Encounter (Unsigned)
 Copied from CRM [id number]. Topic: General - Other >> Jan 16, 2024 10:46 AM Sim Boast F wrote: Reason for CRM: Burna Mortimer from [facility] calling to request that the Client Coordination Note Report be sent back over to her today at [phone]. Looks like the fax was sent yesterday. >> Jan 24, 2024  1:16 PM Fonda Kinder J wrote: [facility] called in requesting orders for home health certifications and plan of care. The orders can be faxed to [phone]

## 2024-01-24 NOTE — ED Notes (Signed)
 Pt alert and oriented X 4 at the time of discharge. RR even and unlabored. No acute distress noted. Pt verbalized understanding of discharge instructions as discussed. Pt transported in wheelchair to lobby at time of discharge.

## 2024-01-24 NOTE — ED Triage Notes (Signed)
 Son reports mechanical fall last night. Patient c/o left knee pain and left lower back pain.

## 2024-01-24 NOTE — Discharge Instructions (Addendum)
??? ??? ???????????, ???????????? ???????? ?????? ? ????? ? ??????, ?? ??????? ????? ????????? ??? ??????? ??????. ?????????? ???????? ??????   6-8 ????? ?? ????. ?? ????? ?????? ?????????? ???????????? ???????? ??? ???????, ??????? ????? ???????? ?? ??????????? ???????. ?? ?????? ????????????? ???? ????? ?????????? ? ??????? ????????? ?????????? ????, ??? ???????? ?????? ????? ???????. ?????????? ? ?????? ???????? ????? ? ??????? ????????? ?????????? ???? ??? ????????? ?????? ????? ?????????.   ?????????? ?????????? ?? ???????, ???? ? ???: ?????? ???????? ??? ????????? ? ????????????? ????? ???????. ???????, ??????? ???????? ?????? ??????. 

## 2024-01-25 ENCOUNTER — Telehealth: Payer: Self-pay

## 2024-01-25 NOTE — Transitions of Care (Post Inpatient/ED Visit) (Signed)
 01/25/2024  Name: Katelyn Jackson MRN: [id number] DOB: October 23, 1946  Today's TOC FU Call Status: Today's TOC FU Call Status:: Successful TOC FU Call Completed TOC FU Call Complete Date: 01/25/24 Patient's Name and Date of Birth confirmed.  Transition Care Management Follow-up Telephone Call Date of Discharge: 01/24/24 Discharge Facility: [facility] Type of Discharge: Emergency Department Reason for ED Visit: Other: (fall) How have you been since you were released from the hospital?: Better Any questions or concerns?: No  Items Reviewed: Did you receive and understand the discharge instructions provided?: Yes Medications obtained,verified, and reconciled?: Yes (Medications Reviewed) Any new allergies since your discharge?: No Dietary orders reviewed?: NA Do you have support at home?: No  Medications Reviewed Today: Medications Reviewed Today     Reviewed by Karena Addison, LPN (Licensed Practical Nurse) on 01/25/24 at 1134  Med List Status: <None>   Medication Order Taking? Sig Documenting Provider Last Dose Status Informant  acetaminophen (TYLENOL) 500 MG tablet [id number] No Take 1,000 mg by mouth at bedtime. [provider] Taking Active Self, Child, Pharmacy Records  allopurinol (ZYLOPRIM) 100 MG tablet [id number] No Take 0.5 tablets (50 mg total) by mouth daily. Plotnikov, Georgina Quint, MD Taking Active Self, Child, Pharmacy Records  apixaban (ELIQUIS) 5 MG TABS tablet [id number] No Take 1 tablet (5 mg total) by mouth 2 (two) times daily. Plotnikov, Georgina Quint, MD Taking Active Self, Child, Pharmacy Records  azithromycin (ZITHROMAX Z-PAK) 250 MG tablet [id number]  As directed Plotnikov, Georgina Quint, MD  Active   carvedilol (COREG) 12.5 MG tablet [id number]  Take 1 tablet (12.5 mg total) by mouth 2 (two) times daily with a meal. Plotnikov, Georgina Quint, MD  Active   ipratropium-albuterol (DUONEB) 0.5-2.5 (3) MG/3ML SOLN [id number] No Take 3 mLs by nebulization every 6  (six) hours as needed. Plotnikov, Georgina Quint, MD Taking Active   lactulose (CHRONULAC) 10 GM/15ML solution [id number] No TAKE 15 MILLILITERS BY MOUTH THREE TIMESDAILY Plotnikov, Georgina Quint, MD Taking Active Self, Child, Pharmacy Records  levothyroxine (SYNTHROID) 50 MCG tablet [id number] No Take 1 tablet (50 mcg total) by mouth daily before breakfast. Overdue for Annual appt must see provider for future refills  Patient taking differently: Take 50 mcg by mouth See admin instructions. Take 1 tablet ( ) daily before breakfast on Saturday, Sunday, Monday only.   Plotnikov, Georgina Quint, MD Taking Active Self, Child, Pharmacy Records  magnesium oxide (MAG-OX) 400 MG tablet [id number] No Take 1 tablet (400 mg total) by mouth 2 (two) times daily. Plotnikov, Georgina Quint, MD Taking Active Self, Child, Pharmacy Records  Menthol-Zinc Oxide [facility]) 0.44-20.6 % OINT [id number] No Apply topically. [provider] Taking Active   methylPREDNISolone (MEDROL DOSEPAK) 4 MG TBPK tablet [id number]  As directed Plotnikov, Georgina Quint, MD  Active   omeprazole (PRILOSEC) 40 MG capsule [id number] No Take 1 capsule (40 mg total) by mouth daily. Plotnikov, Georgina Quint, MD Taking Active Self, Child, Pharmacy Records  polyethylene glycol (MIRALAX / GLYCOLAX) 17 g packet [id number] No Take 17 g by mouth 2 (two) times daily. Arrien, York Ram, MD Taking Active   promethazine-dextromethorphan (PROMETHAZINE-DM) 6.25-15 MG/5ML syrup [id number]  Take 5 mLs by mouth 4 (four) times daily as needed for cough. Plotnikov, Georgina Quint, MD  Active   rifaximin (XIFAXAN) 550 MG TABS tablet [id number] No Take 550 mg by mouth 2 (two) times daily. [provider] Taking Active Self, Child, Pharmacy Records           Med Note (COFFELL, Marzella Schlein  Fri Nov 24, 2023  5:52 PM) Pt's son asked this medication be removed from chart as he said pt's family doctor discontinued this and lovastatin. Upon chart review, it looks like only ezetimibe  and lovastatin were discontinued - I am concerned about possible misunderstanding.  spironolactone (ALDACTONE) 25 MG tablet [id number] No Take 1 tablet (25 mg total) by mouth daily. Plotnikov, Georgina Quint, MD Taking Active Self, Child, Pharmacy Records  torsemide 40 MG TABS [id number] No Take 40 mg by mouth daily. Arrien, York Ram, MD Taking Expired 01/01/24 2359   traMADol (ULTRAM) 50 MG tablet [id number] No Take 1 tablet (50 mg total) by mouth every 6 (six) hours as needed for severe pain (pain score 7-10). Arrien, York Ram, MD Taking Active   triamcinolone cream (KENALOG) 0.1 % [id number] No Apply 1 Application topically 2 (two) times daily. To dry patches on BLE and bilateral feet Plotnikov, Georgina Quint, MD Taking Active Self, Child, Pharmacy Records            Home Care and Equipment/Supplies: Were Home Health Services Ordered?: NA Any new equipment or medical supplies ordered?: NA  Functional Questionnaire: Do you need assistance with bathing/showering or dressing?: No Do you need assistance with meal preparation?: No Do you need assistance with eating?: No Do you have difficulty maintaining continence: No Do you need assistance with getting out of bed/getting out of a chair/moving?: No Do you have difficulty managing or taking your medications?: No  Follow up appointments reviewed: PCP Follow-up appointment confirmed?: No (declined, son states will call back) MD Provider Line Number:[phone] Given: No Specialist Hospital Follow-up appointment confirmed?: NA Do you need transportation to your follow-up appointment?: No Do you understand care options if your condition(s) worsen?: Yes-patient verbalized understanding    SIGNATURE Karena Addison, LPN [facility] Nurse Health Advisor Direct Dial [phone]

## 2024-01-26 NOTE — Telephone Encounter (Signed)
 Form has been signed and faxed back over.

## 2024-01-26 NOTE — Telephone Encounter (Signed)
 Okay. Thank you.

## 2024-02-07 ENCOUNTER — Telehealth: Payer: Self-pay | Admitting: Internal Medicine

## 2024-02-07 NOTE — Telephone Encounter (Signed)
 Copied from CRM [id number]. Topic: Clinical - Home Health Verbal Orders >> Feb 07, 2024 11:47 AM [place] J wrote: Caller/Agency: [facility] Callback Number: [phone] Voicemail box secured for verbal orders. Service Requested: Physical Therapy Frequency: 1 week 3 Any new concerns about the patient? No

## 2024-02-12 ENCOUNTER — Encounter: Payer: Self-pay | Admitting: Internal Medicine

## 2024-02-12 ENCOUNTER — Ambulatory Visit (INDEPENDENT_AMBULATORY_CARE_PROVIDER_SITE_OTHER): Payer: Medicare HMO | Admitting: Internal Medicine

## 2024-02-12 VITALS — BP 122/76 | HR 76 | Temp 98.1°F | Ht 61.0 in

## 2024-02-12 DIAGNOSIS — I10 Essential (primary) hypertension: Secondary | ICD-10-CM | POA: Diagnosis not present

## 2024-02-12 DIAGNOSIS — I890 Lymphedema, not elsewhere classified: Secondary | ICD-10-CM

## 2024-02-12 DIAGNOSIS — I5022 Chronic systolic (congestive) heart failure: Secondary | ICD-10-CM

## 2024-02-12 DIAGNOSIS — N184 Chronic kidney disease, stage 4 (severe): Secondary | ICD-10-CM | POA: Diagnosis not present

## 2024-02-12 DIAGNOSIS — G8929 Other chronic pain: Secondary | ICD-10-CM | POA: Diagnosis not present

## 2024-02-12 DIAGNOSIS — I482 Chronic atrial fibrillation, unspecified: Secondary | ICD-10-CM | POA: Diagnosis not present

## 2024-02-12 DIAGNOSIS — I130 Hypertensive heart and chronic kidney disease with heart failure and stage 1 through stage 4 chronic kidney disease, or unspecified chronic kidney disease: Secondary | ICD-10-CM

## 2024-02-12 DIAGNOSIS — M5442 Lumbago with sciatica, left side: Secondary | ICD-10-CM

## 2024-02-12 DIAGNOSIS — R296 Repeated falls: Secondary | ICD-10-CM

## 2024-02-12 MED ORDER — OMEPRAZOLE 40 MG PO CPDR: 40.0000 mg | DELAYED_RELEASE_CAPSULE | Freq: Every day | ORAL | 3 refills | Status: DC

## 2024-02-12 MED ORDER — LEVOTHYROXINE SODIUM 50 MCG PO TABS: 50.0000 ug | ORAL_TABLET | Freq: Every day | ORAL | 3 refills | Status: DC

## 2024-02-12 MED ORDER — TRAMADOL HCL 50 MG PO TABS: 50.0000 mg | ORAL_TABLET | Freq: Four times a day (QID) | ORAL | 0 refills | Status: DC | PRN

## 2024-02-12 MED ORDER — ALLOPURINOL 100 MG PO TABS: 50.0000 mg | ORAL_TABLET | Freq: Every day | ORAL | 1 refills | Status: AC

## 2024-02-12 MED ORDER — SPIRONOLACTONE 25 MG PO TABS: 25.0000 mg | ORAL_TABLET | Freq: Every day | ORAL | 3 refills | Status: DC

## 2024-02-12 MED ORDER — TRAMADOL HCL 50 MG PO TABS: 50.0000 mg | ORAL_TABLET | Freq: Four times a day (QID) | ORAL | 1 refills | Status: DC | PRN

## 2024-02-12 MED ORDER — TORSEMIDE 20 MG PO TABS: 60.0000 mg | ORAL_TABLET | Freq: Every day | ORAL | 1 refills | Status: DC

## 2024-02-12 NOTE — Assessment & Plan Note (Signed)
 Treat leg edema with torsemide 60 mg daily and spironolactone.  Start PT

## 2024-02-12 NOTE — Assessment & Plan Note (Signed)
 Chronic back pain.  The patient needs assistance at home -- PCS Tramadol prn  Potential benefits of a long term opioids use as well as potential risks (i.e. addiction risk, apnea etc) and complications (i.e. Somnolence, constipation and others) were explained to the patient and were aknowledged. Start home PT/OT PCS home care form was filled out and faxed a few weeks ago

## 2024-02-12 NOTE — Assessment & Plan Note (Signed)
 Take torsemide 60 mg daily.  Continue spironolactone.

## 2024-02-12 NOTE — Progress Notes (Signed)
 Subjective:  Patient ID: Katelyn Jackson, female    DOB: 1945-11-30  Age: 78 y.o. MRN: [id number]  CC: Medical Management of Chronic Issues (6 Week follow up regarding pneumonia. Noted no concerns and pneumonia has alleviated. Patient did have fall 3 weeks ago with no injury (patient attempted to sit on a recliner and had fallen))   HPI Katelyn Jackson presents for leg edema - the pt is unable to reduce her sodium intake (no will power) C/o fatigue post recent fall  Here w/son [name]  Outpatient Medications Prior to Visit  Medication Sig Dispense Refill   acetaminophen (TYLENOL) 500 MG tablet Take 1,000 mg by mouth at bedtime.     apixaban (ELIQUIS) 5 MG TABS tablet Take 1 tablet (5 mg total) by mouth 2 (two) times daily. 180 tablet 3   azithromycin (ZITHROMAX Z-PAK) 250 MG tablet As directed 6 tablet 0   carvedilol (COREG) 12.5 MG tablet Take 1 tablet (12.5 mg total) by mouth 2 (two) times daily with a meal. 180 tablet 3   ipratropium-albuterol (DUONEB) 0.5-2.5 (3) MG/3ML SOLN Take 3 mLs by nebulization every 6 (six) hours as needed. 270 mL 3   lactulose (CHRONULAC) 10 GM/15ML solution TAKE 15 MILLILITERS BY MOUTH THREE TIMESDAILY 946 mL 3   magnesium oxide (MAG-OX) 400 MG tablet Take 1 tablet (400 mg total) by mouth 2 (two) times daily. 180 tablet 3   Menthol-Zinc Oxide (CALMOSEPTINE) 0.44-20.6 % OINT Apply topically.     methylPREDNISolone (MEDROL DOSEPAK) 4 MG TBPK tablet As directed 21 tablet 0   polyethylene glycol (MIRALAX / GLYCOLAX) 17 g packet Take 17 g by mouth 2 (two) times daily. 14 each 0   promethazine-dextromethorphan (PROMETHAZINE-DM) 6.25-15 MG/5ML syrup Take 5 mLs by mouth 4 (four) times daily as needed for cough. 240 mL 1   rifaximin (XIFAXAN) 550 MG TABS tablet Take 550 mg by mouth 2 (two) times daily.     triamcinolone cream (KENALOG) 0.1 % Apply 1 Application topically 2 (two) times daily. To dry patches on BLE and bilateral feet 450 g 3   allopurinol  (ZYLOPRIM) 100 MG tablet Take 0.5 tablets (50 mg total) by mouth daily. 90 tablet 1   levothyroxine (SYNTHROID) 50 MCG tablet Take 1 tablet (50 mcg total) by mouth daily before breakfast. Overdue for Annual appt must see provider for future refills (Patient taking differently: Take 50 mcg by mouth See admin instructions. Take 1 tablet ( ) daily before breakfast on Saturday, Sunday, Monday only.) 90 tablet 3   omeprazole (PRILOSEC) 40 MG capsule Take 1 capsule (40 mg total) by mouth daily. 90 capsule 3   spironolactone (ALDACTONE) 25 MG tablet Take 1 tablet (25 mg total) by mouth daily. 90 tablet 3   traMADol (ULTRAM) 50 MG tablet Take 1 tablet (50 mg total) by mouth every 6 (six) hours as needed for severe pain (pain score 7-10). 10 tablet 0   torsemide 40 MG TABS Take 40 mg by mouth daily. 30 tablet 0   No facility-administered medications prior to visit.    ROS: Review of Systems  Constitutional:  Positive for fatigue. Negative for activity change, appetite change, chills and unexpected weight change.  HENT:  Negative for congestion, mouth sores and sinus pressure.   Eyes:  Negative for visual disturbance.  Respiratory:  Negative for cough and chest tightness.   Cardiovascular:  Positive for leg swelling.  Gastrointestinal:  Negative for abdominal pain and nausea.  Genitourinary:  Negative for difficulty urinating, frequency and  vaginal pain.  Musculoskeletal:  Negative for back pain and gait problem.  Skin:  Negative for pallor and rash.  Neurological:  Positive for weakness. Negative for dizziness, tremors, numbness and headaches.  Psychiatric/Behavioral:  Positive for decreased concentration. Negative for confusion, sleep disturbance and suicidal ideas.     Objective:  BP 122/76   Pulse 76   Temp 98.1 F (36.7 C)   Ht 5\' 1"  (1.549 m)   LMP  (LMP Unknown)   SpO2 99%   BMI 30.80 kg/m   BP Readings from Last 3 Encounters:  02/12/24 122/76  01/24/24 121/60  01/01/24 (!)  144/74    Wt Readings from Last 3 Encounters:  01/24/24 163 lb (73.9 kg)  12/29/23 168 lb (76.2 kg)  12/11/23 176 lb (79.8 kg)    Physical Exam Constitutional:      General: She is not in acute distress.    Appearance: She is well-developed.  HENT:     Head: Normocephalic.     Right Ear: External ear normal.     Left Ear: External ear normal.     Nose: Nose normal.  Eyes:     General:        Right eye: No discharge.        Left eye: No discharge.     Conjunctiva/sclera: Conjunctivae normal.     Pupils: Pupils are equal, round, and reactive to light.  Neck:     Thyroid: No thyromegaly.     Vascular: No JVD.     Trachea: No tracheal deviation.  Cardiovascular:     Rate and Rhythm: Normal rate and regular rhythm.     Heart sounds: Normal heart sounds.  Pulmonary:     Effort: No respiratory distress.     Breath sounds: No stridor. No wheezing.  Abdominal:     General: Bowel sounds are normal. There is no distension.     Palpations: Abdomen is soft. There is no mass.     Tenderness: There is no abdominal tenderness. There is no guarding or rebound.  Musculoskeletal:        General: No tenderness.     Cervical back: Normal range of motion and neck supple. No rigidity.     Right lower leg: Edema present.     Left lower leg: Edema present.  Lymphadenopathy:     Cervical: No cervical adenopathy.  Skin:    Findings: No erythema or rash.  Neurological:     Cranial Nerves: No cranial nerve deficit.     Motor: No abnormal muscle tone.     Coordination: Coordination normal.     Gait: Gait abnormal.     Deep Tendon Reflexes: Reflexes normal.  Psychiatric:        Behavior: Behavior normal.        Thought Content: Thought content normal.        Judgment: Judgment normal.    B edema 3+  LS w/pain In a w/c   A total time of 45 minutes was spent preparing to see the patient, reviewing tests, x-rays,,other medical records.  Also, obtaining history and performing  comprehensive physical exam.  Additionally, counseling the patient regarding the above listed issues.   Finally, documenting clinical information in the health records, coordination of care, educating the patient. [facility] PT referral was placed    Lab Results  Component Value Date   WBC 4.1 11/29/2023   HGB 8.2 (L) 11/30/2023   HCT 28.5 (L) 11/30/2023   PLT 155 11/29/2023  GLUCOSE 131 (H) 11/30/2023   CHOL 151 08/21/2023   TRIG 67 08/21/2023   HDL 63 08/21/2023   LDLCALC 75 08/21/2023   ALT 13 11/25/2023   AST 20 11/25/2023   NA 137 11/30/2023   K 4.5 11/30/2023   CL 102 11/30/2023   CREATININE 1.62 (H) 11/30/2023   BUN 32 (H) 11/30/2023   CO2 28 11/30/2023   TSH 1.75 11/02/2023   INR 1.4 (H) 08/20/2023   HGBA1C 5.5 11/02/2023    DG Hip Unilat W or Wo Pelvis 2-3 Views Left Result Date: 01/24/2024 CLINICAL DATA:  Left hip pain after fall EXAM: DG HIP (WITH OR WITHOUT PELVIS) 2-3V LEFT COMPARISON:  05/17/2023 FINDINGS: Frontal view of the pelvis as well as frontal and frogleg lateral views of the left hip are obtained. No acute displaced fracture, subluxation, or dislocation. Stable symmetrical bilateral hip osteoarthritis. Stable lower lumbar degenerative change. Soft tissues are unremarkable. IMPRESSION: 1. Degenerative changes of the bilateral hips and lumbar spine, stable. 2. No acute displaced fracture. Electronically Signed   By: Sharlet Salina M.D.   On: 01/24/2024 17:00   DG Lumbar Spine Complete Result Date: 01/24/2024 CLINICAL DATA:  Low back pain after fall. EXAM: LUMBAR SPINE - COMPLETE 4+ VIEW COMPARISON:  June 14, 2023. FINDINGS: There is no evidence of lumbar spine fracture. Alignment is normal. Severe degenerative disc disease is noted at L3-4, L4-5 and L5-S1. IMPRESSION: Severe multilevel degenerative disc disease. No acute abnormality seen. Electronically Signed   By: Lupita Raider M.D.   On: 01/24/2024 17:00   DG Knee Complete 4 Views Left Result Date:  01/24/2024 CLINICAL DATA:  Larey Seat, left knee pain EXAM: LEFT KNEE - COMPLETE 4+ VIEW COMPARISON:  08/20/2023 FINDINGS: Frontal, bilateral oblique, lateral views of the left knee are obtained. Evaluation is limited by patient positioning and body habitus. No acute displaced fracture. Nonunion of prior lower pole patellar fracture again noted. Severe 3 compartmental osteoarthritis greatest in the medial and patellofemoral compartments. There is diffuse subcutaneous edema. No evidence of joint effusion. IMPRESSION: 1. No acute displaced fracture. 2. Severe 3 compartmental osteoarthritis unchanged. 3. 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 4. Diffuse soft tissue edema. Electronically Signed   By: Sharlet Salina M.D.   On: 01/24/2024 16:59    Assessment & Plan:   Problem List Items Addressed This Visit     Essential hypertension (Chronic)   Relevant Medications   spironolactone (ALDACTONE) 25 MG tablet   torsemide (DEMADEX) 20 MG tablet   Other Relevant Orders   CBC with Differential/Platelet   Comprehensive metabolic panel with GFR   T4, free   TSH   Hemoglobin A1c   Uric acid   Vitamin B12   Chronic atrial fibrillation (HCC) (Chronic)   Off Coumadin.  On Eliquis      Relevant Medications   spironolactone (ALDACTONE) 25 MG tablet   torsemide (DEMADEX) 20 MG tablet   Other Relevant Orders   CBC with Differential/Platelet   Comprehensive metabolic panel with GFR   T4, free   TSH   Hemoglobin A1c   Uric acid   Vitamin B12   Chronic systolic CHF (congestive heart failure) (HCC) (Chronic)   Take torsemide 60 mg daily.  Continue spironolactone.      Relevant Medications   spironolactone (ALDACTONE) 25 MG tablet   torsemide (DEMADEX) 20 MG tablet   Lymphedema   Treat leg edema with torsemide 60 mg daily and spironolactone.  Start PT  Relevant Orders   CBC with Differential/Platelet   Comprehensive metabolic panel with GFR   T4, free   TSH   Hemoglobin A1c    Uric acid   Vitamin B12   Ambulatory referral to Home Health   Low back pain - Primary   Chronic back pain.  The patient needs assistance at home -- PCS Tramadol prn  Potential benefits of a long term opioids use as well as potential risks (i.e. addiction risk, apnea etc) and complications (i.e. Somnolence, constipation and others) were explained to the patient and were aknowledged. Start home PT/OT PCS home care form was filled out and faxed a few weeks ago      Relevant Medications   traMADol (ULTRAM) 50 MG tablet   Other Relevant Orders   Ambulatory referral to Home Health   Other Visit Diagnoses       CKD (chronic kidney disease) stage 4, GFR 15-29 ml/min (HCC)       Relevant Orders   CBC with Differential/Platelet   Comprehensive metabolic panel with GFR   T4, free   TSH   Hemoglobin A1c   Uric acid   Vitamin B12     Falls       Relevant Orders   Ambulatory referral to Home Health         Meds ordered this encounter  Medications   levothyroxine (SYNTHROID) 50 MCG tablet    Sig: Take 1 tablet (50 mcg total) by mouth daily before breakfast. Overdue for Annual appt must see provider for future refills    Dispense:  90 tablet    Refill:  3   omeprazole (PRILOSEC) 40 MG capsule    Sig: Take 1 capsule (40 mg total) by mouth daily.    Dispense:  90 capsule    Refill:  3   spironolactone (ALDACTONE) 25 MG tablet    Sig: Take 1 tablet (25 mg total) by mouth daily.    Dispense:  90 tablet    Refill:  3   DISCONTD: traMADol (ULTRAM) 50 MG tablet    Sig: Take 1 tablet (50 mg total) by mouth every 6 (six) hours as needed for severe pain (pain score 7-10).    Dispense:  10 tablet    Refill:  0   torsemide (DEMADEX) 20 MG tablet    Sig: Take 3 tablets (60 mg total) by mouth daily.    Dispense:  270 tablet    Refill:  1   allopurinol (ZYLOPRIM) 100 MG tablet    Sig: Take 0.5 tablets (50 mg total) by mouth daily.    Dispense:  90 tablet    Refill:  1   traMADol  (ULTRAM) 50 MG tablet    Sig: Take 1 tablet (50 mg total) by mouth every 6 (six) hours as needed for severe pain (pain score 7-10).    Dispense:  120 tablet    Refill:  1      Follow-up: Return in about 3 months (around 05/13/2024) for a follow-up visit.  Sonda Primes, MD

## 2024-02-12 NOTE — Assessment & Plan Note (Signed)
Off Coumadin. On Eliquis

## 2024-02-13 LAB — CBC WITH DIFFERENTIAL/PLATELET
Basophils Absolute: 0.1 10*3/uL (ref 0.0–0.1)
Basophils Relative: 2.1 % (ref 0.0–3.0)
Eosinophils Absolute: 0.2 10*3/uL (ref 0.0–0.7)
Eosinophils Relative: 6.6 % — ABNORMAL HIGH (ref 0.0–5.0)
HCT: 31 % — ABNORMAL LOW (ref 36.0–46.0)
Hemoglobin: 9.9 g/dL — ABNORMAL LOW (ref 12.0–15.0)
Lymphocytes Relative: 33.8 % (ref 12.0–46.0)
Lymphs Abs: 1.2 10*3/uL (ref 0.7–4.0)
MCHC: 32.1 g/dL (ref 30.0–36.0)
MCV: 90.9 fl (ref 78.0–100.0)
Monocytes Absolute: 0.7 10*3/uL (ref 0.1–1.0)
Monocytes Relative: 18.5 % — ABNORMAL HIGH (ref 3.0–12.0)
Neutro Abs: 1.4 10*3/uL (ref 1.4–7.7)
Neutrophils Relative %: 39 % — ABNORMAL LOW (ref 43.0–77.0)
Platelets: 123 10*3/uL — ABNORMAL LOW (ref 150.0–400.0)
RBC: 3.41 Mil/uL — ABNORMAL LOW (ref 3.87–5.11)
RDW: 21.2 % — ABNORMAL HIGH (ref 11.5–15.5)
WBC: 3.6 10*3/uL — ABNORMAL LOW (ref 4.0–10.5)

## 2024-02-13 LAB — COMPREHENSIVE METABOLIC PANEL WITH GFR
ALT: 10 U/L (ref 0–35)
AST: 17 U/L (ref 0–37)
Albumin: 3.9 g/dL (ref 3.5–5.2)
Alkaline Phosphatase: 129 U/L — ABNORMAL HIGH (ref 39–117)
BUN: 29 mg/dL — ABNORMAL HIGH (ref 6–23)
CO2: 28 meq/L (ref 19–32)
Calcium: 9.8 mg/dL (ref 8.4–10.5)
Chloride: 101 meq/L (ref 96–112)
Creatinine, Ser: 1.59 mg/dL — ABNORMAL HIGH (ref 0.40–1.20)
GFR: 31 mL/min — ABNORMAL LOW (ref >60.00)
Glucose, Bld: 106 mg/dL — ABNORMAL HIGH (ref 70–99)
Potassium: 4.2 meq/L (ref 3.5–5.1)
Sodium: 138 meq/L (ref 135–145)
Total Bilirubin: 0.7 mg/dL (ref 0.2–1.2)
Total Protein: 6.9 g/dL (ref 6.0–8.3)

## 2024-02-13 LAB — VITAMIN B12: Vitamin B-12: 453 pg/mL (ref 211–911)

## 2024-02-13 LAB — T4, FREE: Free T4: 1.09 ng/dL (ref 0.60–1.60)

## 2024-02-13 LAB — URIC ACID: Uric Acid, Serum: 6.6 mg/dL (ref 2.4–7.0)

## 2024-02-13 LAB — TSH: TSH: 3.16 u[IU]/mL (ref 0.35–5.50)

## 2024-02-13 LAB — HEMOGLOBIN A1C: Hgb A1c MFr Bld: 4.8 % (ref 4.6–6.5)

## 2024-02-13 NOTE — Telephone Encounter (Signed)
 Okay.  Thanks.

## 2024-02-14 NOTE — Telephone Encounter (Signed)
 Called and spoke with Remigio Eisenmenger. Informed them of approved verbals for extended physical therapy orders.

## 2024-03-22 ENCOUNTER — Other Ambulatory Visit: Payer: Self-pay | Admitting: Internal Medicine

## 2024-04-11 ENCOUNTER — Ambulatory Visit: Admitting: Podiatry

## 2024-04-23 ENCOUNTER — Encounter: Payer: Self-pay | Admitting: Podiatry

## 2024-04-23 ENCOUNTER — Ambulatory Visit (INDEPENDENT_AMBULATORY_CARE_PROVIDER_SITE_OTHER): Admitting: Podiatry

## 2024-04-23 DIAGNOSIS — B351 Tinea unguium: Secondary | ICD-10-CM

## 2024-04-23 DIAGNOSIS — M79676 Pain in unspecified toe(s): Secondary | ICD-10-CM

## 2024-04-23 DIAGNOSIS — D689 Coagulation defect, unspecified: Secondary | ICD-10-CM

## 2024-04-23 DIAGNOSIS — D2372 Other benign neoplasm of skin of left lower limb, including hip: Secondary | ICD-10-CM | POA: Diagnosis not present

## 2024-04-24 NOTE — Progress Notes (Signed)
 She presents today with her [place] interpreter she is wheelchair-bound and needs her nails debrided.  Objective: Pulses are palpable severe edema bilateral.  Toenails are long thick yellow dystrophic like mycotic and painful on palpation.  Assessment: Pain in limb secondary to elongated painful nails and mycosis.  Plan: Debridement of toenails 1 through 5 bilateral.  I did recommend O'Keefe's cream for her rough heels.

## 2024-05-06 DIAGNOSIS — Z45018 Encounter for adjustment and management of other part of cardiac pacemaker: Secondary | ICD-10-CM | POA: Diagnosis not present

## 2024-05-09 ENCOUNTER — Encounter (HOSPITAL_COMMUNITY): Payer: Self-pay

## 2024-05-09 ENCOUNTER — Emergency Department (HOSPITAL_COMMUNITY)

## 2024-05-09 ENCOUNTER — Inpatient Hospital Stay (HOSPITAL_COMMUNITY)
Admission: EM | Admit: 2024-05-09 | Discharge: 2024-05-13 | DRG: 291 | Disposition: A | Discharge status: Home Health | Attending: Internal Medicine | Admitting: Internal Medicine

## 2024-05-09 DIAGNOSIS — Y92009 Unspecified place in unspecified non-institutional (private) residence as the place of occurrence of the external cause: Secondary | ICD-10-CM

## 2024-05-09 DIAGNOSIS — M109 Gout, unspecified: Secondary | ICD-10-CM | POA: Diagnosis present

## 2024-05-09 DIAGNOSIS — W050XXA Fall from non-moving wheelchair, initial encounter: Secondary | ICD-10-CM | POA: Diagnosis present

## 2024-05-09 DIAGNOSIS — M25412 Effusion, left shoulder: Secondary | ICD-10-CM | POA: Diagnosis not present

## 2024-05-09 DIAGNOSIS — Z603 Acculturation difficulty: Secondary | ICD-10-CM | POA: Diagnosis present

## 2024-05-09 DIAGNOSIS — Z888 Allergy status to other drugs, medicaments and biological substances status: Secondary | ICD-10-CM

## 2024-05-09 DIAGNOSIS — D6959 Other secondary thrombocytopenia: Secondary | ICD-10-CM | POA: Diagnosis present

## 2024-05-09 DIAGNOSIS — I890 Lymphedema, not elsewhere classified: Secondary | ICD-10-CM | POA: Diagnosis present

## 2024-05-09 DIAGNOSIS — G8929 Other chronic pain: Secondary | ICD-10-CM | POA: Diagnosis present

## 2024-05-09 DIAGNOSIS — R9082 White matter disease, unspecified: Secondary | ICD-10-CM | POA: Diagnosis not present

## 2024-05-09 DIAGNOSIS — K7460 Unspecified cirrhosis of liver: Secondary | ICD-10-CM | POA: Diagnosis present

## 2024-05-09 DIAGNOSIS — M19011 Primary osteoarthritis, right shoulder: Secondary | ICD-10-CM | POA: Diagnosis not present

## 2024-05-09 DIAGNOSIS — Z6833 Body mass index (BMI) 33.0-33.9, adult: Secondary | ICD-10-CM

## 2024-05-09 DIAGNOSIS — M25512 Pain in left shoulder: Secondary | ICD-10-CM | POA: Diagnosis not present

## 2024-05-09 DIAGNOSIS — M19012 Primary osteoarthritis, left shoulder: Secondary | ICD-10-CM | POA: Diagnosis not present

## 2024-05-09 DIAGNOSIS — Z950 Presence of cardiac pacemaker: Secondary | ICD-10-CM | POA: Diagnosis not present

## 2024-05-09 DIAGNOSIS — Z923 Personal history of irradiation: Secondary | ICD-10-CM

## 2024-05-09 DIAGNOSIS — Z880 Allergy status to penicillin: Secondary | ICD-10-CM

## 2024-05-09 DIAGNOSIS — I10 Essential (primary) hypertension: Secondary | ICD-10-CM | POA: Diagnosis not present

## 2024-05-09 DIAGNOSIS — I4821 Permanent atrial fibrillation: Secondary | ICD-10-CM | POA: Diagnosis present

## 2024-05-09 DIAGNOSIS — Z8249 Family history of ischemic heart disease and other diseases of the circulatory system: Secondary | ICD-10-CM | POA: Diagnosis not present

## 2024-05-09 DIAGNOSIS — N179 Acute kidney failure, unspecified: Secondary | ICD-10-CM | POA: Diagnosis present

## 2024-05-09 DIAGNOSIS — I5023 Acute on chronic systolic (congestive) heart failure: Secondary | ICD-10-CM | POA: Diagnosis not present

## 2024-05-09 DIAGNOSIS — D631 Anemia in chronic kidney disease: Secondary | ICD-10-CM | POA: Diagnosis present

## 2024-05-09 DIAGNOSIS — M25712 Osteophyte, left shoulder: Secondary | ICD-10-CM | POA: Diagnosis not present

## 2024-05-09 DIAGNOSIS — E669 Obesity, unspecified: Secondary | ICD-10-CM | POA: Diagnosis present

## 2024-05-09 DIAGNOSIS — R102 Pelvic and perineal pain: Secondary | ICD-10-CM | POA: Diagnosis not present

## 2024-05-09 DIAGNOSIS — W19XXXA Unspecified fall, initial encounter: Principal | ICD-10-CM

## 2024-05-09 DIAGNOSIS — M549 Dorsalgia, unspecified: Secondary | ICD-10-CM | POA: Diagnosis present

## 2024-05-09 DIAGNOSIS — E8770 Fluid overload, unspecified: Secondary | ICD-10-CM | POA: Diagnosis not present

## 2024-05-09 DIAGNOSIS — I130 Hypertensive heart and chronic kidney disease with heart failure and stage 1 through stage 4 chronic kidney disease, or unspecified chronic kidney disease: Principal | ICD-10-CM | POA: Diagnosis present

## 2024-05-09 DIAGNOSIS — Z823 Family history of stroke: Secondary | ICD-10-CM

## 2024-05-09 DIAGNOSIS — M85812 Other specified disorders of bone density and structure, left shoulder: Secondary | ICD-10-CM | POA: Diagnosis not present

## 2024-05-09 DIAGNOSIS — Z7901 Long term (current) use of anticoagulants: Secondary | ICD-10-CM

## 2024-05-09 DIAGNOSIS — S199XXA Unspecified injury of neck, initial encounter: Secondary | ICD-10-CM | POA: Diagnosis not present

## 2024-05-09 DIAGNOSIS — M25411 Effusion, right shoulder: Secondary | ICD-10-CM | POA: Diagnosis not present

## 2024-05-09 DIAGNOSIS — I272 Pulmonary hypertension, unspecified: Secondary | ICD-10-CM | POA: Diagnosis present

## 2024-05-09 DIAGNOSIS — E785 Hyperlipidemia, unspecified: Secondary | ICD-10-CM | POA: Diagnosis present

## 2024-05-09 DIAGNOSIS — Z825 Family history of asthma and other chronic lower respiratory diseases: Secondary | ICD-10-CM

## 2024-05-09 DIAGNOSIS — Z853 Personal history of malignant neoplasm of breast: Secondary | ICD-10-CM

## 2024-05-09 DIAGNOSIS — Z79899 Other long term (current) drug therapy: Secondary | ICD-10-CM

## 2024-05-09 DIAGNOSIS — M25511 Pain in right shoulder: Secondary | ICD-10-CM | POA: Diagnosis not present

## 2024-05-09 DIAGNOSIS — Z66 Do not resuscitate: Secondary | ICD-10-CM | POA: Diagnosis present

## 2024-05-09 DIAGNOSIS — N1832 Chronic kidney disease, stage 3b: Secondary | ICD-10-CM | POA: Diagnosis present

## 2024-05-09 DIAGNOSIS — I509 Heart failure, unspecified: Secondary | ICD-10-CM

## 2024-05-09 DIAGNOSIS — D696 Thrombocytopenia, unspecified: Secondary | ICD-10-CM

## 2024-05-09 DIAGNOSIS — R609 Edema, unspecified: Secondary | ICD-10-CM | POA: Diagnosis not present

## 2024-05-09 DIAGNOSIS — D638 Anemia in other chronic diseases classified elsewhere: Secondary | ICD-10-CM | POA: Diagnosis present

## 2024-05-09 DIAGNOSIS — Z9221 Personal history of antineoplastic chemotherapy: Secondary | ICD-10-CM | POA: Diagnosis not present

## 2024-05-09 DIAGNOSIS — S0990XA Unspecified injury of head, initial encounter: Secondary | ICD-10-CM | POA: Diagnosis not present

## 2024-05-09 DIAGNOSIS — K219 Gastro-esophageal reflux disease without esophagitis: Secondary | ICD-10-CM | POA: Diagnosis present

## 2024-05-09 DIAGNOSIS — Z79890 Hormone replacement therapy (postmenopausal): Secondary | ICD-10-CM

## 2024-05-09 DIAGNOSIS — E039 Hypothyroidism, unspecified: Secondary | ICD-10-CM | POA: Diagnosis present

## 2024-05-09 DIAGNOSIS — S53114A Anterior dislocation of right ulnohumeral joint, initial encounter: Secondary | ICD-10-CM | POA: Diagnosis not present

## 2024-05-09 DIAGNOSIS — M75102 Unspecified rotator cuff tear or rupture of left shoulder, not specified as traumatic: Secondary | ICD-10-CM | POA: Diagnosis not present

## 2024-05-09 LAB — CBC WITH DIFFERENTIAL/PLATELET
Abs Immature Granulocytes: 0.01 10*3/uL (ref 0.00–0.07)
Basophils Absolute: 0 10*3/uL (ref 0.0–0.1)
Basophils Relative: 1 %
Eosinophils Absolute: 0.1 10*3/uL (ref 0.0–0.5)
Eosinophils Relative: 3 %
HCT: 34.9 % — ABNORMAL LOW (ref 36.0–46.0)
Hemoglobin: 10.6 g/dL — ABNORMAL LOW (ref 12.0–15.0)
Immature Granulocytes: 0 %
Lymphocytes Relative: 20 %
Lymphs Abs: 0.9 10*3/uL (ref 0.7–4.0)
MCH: 29.1 pg (ref 26.0–34.0)
MCHC: 30.4 g/dL (ref 30.0–36.0)
MCV: 95.9 fL (ref 80.0–100.0)
Monocytes Absolute: 0.5 10*3/uL (ref 0.1–1.0)
Monocytes Relative: 10 %
Neutro Abs: 3.1 10*3/uL (ref 1.7–7.7)
Neutrophils Relative %: 66 %
Platelets: 129 10*3/uL — ABNORMAL LOW (ref 150–400)
RBC: 3.64 MIL/uL — ABNORMAL LOW (ref 3.87–5.11)
RDW: 17.6 % — ABNORMAL HIGH (ref 11.5–15.5)
WBC: 4.6 10*3/uL (ref 4.0–10.5)
nRBC: 0 % (ref 0.0–0.2)

## 2024-05-09 LAB — COMPREHENSIVE METABOLIC PANEL WITH GFR
ALT: 16 U/L (ref 0–44)
AST: 19 U/L (ref 15–41)
Albumin: 3.8 g/dL (ref 3.5–5.0)
Alkaline Phosphatase: 139 U/L — ABNORMAL HIGH (ref 38–126)
Anion gap: 11 (ref 5–15)
BUN: 26 mg/dL — ABNORMAL HIGH (ref 8–23)
CO2: 20 mmol/L — ABNORMAL LOW (ref 22–32)
Calcium: 10.2 mg/dL (ref 8.9–10.3)
Chloride: 107 mmol/L (ref 98–111)
Creatinine, Ser: 1.65 mg/dL — ABNORMAL HIGH (ref 0.44–1.00)
GFR, Estimated: 32 mL/min — ABNORMAL LOW (ref >60)
Glucose, Bld: 93 mg/dL (ref 70–99)
Potassium: 4.6 mmol/L (ref 3.5–5.1)
Sodium: 138 mmol/L (ref 135–145)
Total Bilirubin: 1.4 mg/dL — ABNORMAL HIGH (ref 0.0–1.2)
Total Protein: 7.1 g/dL (ref 6.5–8.1)

## 2024-05-09 LAB — BRAIN NATRIURETIC PEPTIDE: B Natriuretic Peptide: 480.2 pg/mL — ABNORMAL HIGH (ref 0.0–100.0)

## 2024-05-09 LAB — CK: Total CK: 114 U/L (ref 38–234)

## 2024-05-09 MED ORDER — LEVOTHYROXINE SODIUM 50 MCG PO TABS
50.0000 ug | ORAL_TABLET | Freq: Every day | ORAL | Status: DC
  Administered 2024-05-10 – 2024-05-13 (×4): 50 ug via ORAL
  Filled 2024-05-09 (×2): qty 1
  Filled 2024-05-09: qty 2
  Filled 2024-05-09: qty 1

## 2024-05-09 MED ORDER — ACETAMINOPHEN 325 MG PO TABS
650.0000 mg | ORAL_TABLET | Freq: Four times a day (QID) | ORAL | Status: DC | PRN
  Administered 2024-05-10 – 2024-05-12 (×4): 650 mg via ORAL
  Filled 2024-05-09 (×4): qty 2

## 2024-05-09 MED ORDER — SENNOSIDES-DOCUSATE SODIUM 8.6-50 MG PO TABS
1.0000 | ORAL_TABLET | Freq: Every evening | ORAL | Status: DC | PRN
  Administered 2024-05-11 – 2024-05-12 (×2): 1 via ORAL
  Filled 2024-05-09 (×2): qty 1

## 2024-05-09 MED ORDER — ONDANSETRON HCL 4 MG PO TABS: 4.0000 mg | ORAL_TABLET | Freq: Four times a day (QID) | ORAL | Status: DC | PRN

## 2024-05-09 MED ORDER — ACETAMINOPHEN 650 MG RE SUPP: 650.0000 mg | Freq: Four times a day (QID) | RECTAL | Status: DC | PRN

## 2024-05-09 MED ORDER — CARVEDILOL 12.5 MG PO TABS
12.5000 mg | ORAL_TABLET | Freq: Two times a day (BID) | ORAL | Status: DC
  Administered 2024-05-10 – 2024-05-13 (×7): 12.5 mg via ORAL
  Filled 2024-05-09 (×7): qty 1

## 2024-05-09 MED ORDER — APIXABAN 5 MG PO TABS
5.0000 mg | ORAL_TABLET | Freq: Two times a day (BID) | ORAL | Status: DC
  Administered 2024-05-10 – 2024-05-13 (×8): 5 mg via ORAL
  Filled 2024-05-09 (×8): qty 1

## 2024-05-09 MED ORDER — MAGNESIUM OXIDE -MG SUPPLEMENT 400 (240 MG) MG PO TABS
400.0000 mg | ORAL_TABLET | Freq: Two times a day (BID) | ORAL | Status: DC
  Administered 2024-05-10 – 2024-05-13 (×7): 400 mg via ORAL
  Filled 2024-05-09 (×7): qty 1

## 2024-05-09 MED ORDER — RIFAXIMIN 550 MG PO TABS
550.0000 mg | ORAL_TABLET | Freq: Two times a day (BID) | ORAL | Status: DC
  Administered 2024-05-10 – 2024-05-13 (×7): 550 mg via ORAL
  Filled 2024-05-09 (×8): qty 1

## 2024-05-09 MED ORDER — PANTOPRAZOLE SODIUM 40 MG PO TBEC
40.0000 mg | DELAYED_RELEASE_TABLET | Freq: Every day | ORAL | Status: DC
  Administered 2024-05-10 – 2024-05-13 (×4): 40 mg via ORAL
  Filled 2024-05-09 (×4): qty 1

## 2024-05-09 MED ORDER — FUROSEMIDE 10 MG/ML IJ SOLN
80.0000 mg | Freq: Two times a day (BID) | INTRAMUSCULAR | Status: DC
  Administered 2024-05-09: 80 mg via INTRAVENOUS
  Filled 2024-05-09: qty 8

## 2024-05-09 MED ORDER — LACTULOSE 10 GM/15ML PO SOLN
10.0000 g | Freq: Three times a day (TID) | ORAL | Status: DC
  Administered 2024-05-10 – 2024-05-13 (×10): 10 g via ORAL
  Filled 2024-05-09 (×10): qty 15

## 2024-05-09 MED ORDER — TRAMADOL HCL 50 MG PO TABS
50.0000 mg | ORAL_TABLET | Freq: Four times a day (QID) | ORAL | Status: DC | PRN
  Administered 2024-05-10 – 2024-05-13 (×5): 50 mg via ORAL
  Filled 2024-05-09 (×6): qty 1

## 2024-05-09 MED ORDER — ALLOPURINOL 100 MG PO TABS
50.0000 mg | ORAL_TABLET | Freq: Every day | ORAL | Status: DC
  Administered 2024-05-10 – 2024-05-13 (×4): 50 mg via ORAL
  Filled 2024-05-09 (×4): qty 1

## 2024-05-09 MED ORDER — SODIUM CHLORIDE 0.9% FLUSH
3.0000 mL | Freq: Two times a day (BID) | INTRAVENOUS | Status: DC
  Administered 2024-05-10 – 2024-05-13 (×7): 3 mL via INTRAVENOUS

## 2024-05-09 MED ORDER — SPIRONOLACTONE 25 MG PO TABS
25.0000 mg | ORAL_TABLET | Freq: Every day | ORAL | Status: DC
  Administered 2024-05-10 – 2024-05-12 (×3): 25 mg via ORAL
  Filled 2024-05-09 (×4): qty 1

## 2024-05-09 MED ORDER — ONDANSETRON HCL 4 MG/2ML IJ SOLN: 4.0000 mg | Freq: Four times a day (QID) | INTRAMUSCULAR | Status: DC | PRN

## 2024-05-09 NOTE — ED Notes (Signed)
 MD at bedside.

## 2024-05-09 NOTE — Hospital Course (Signed)
 Katelyn Jackson is a 78 y.o. female with medical history significant for permanent atrial fibrillation on Eliquis , bradycardia s/p PPM, chronic HFmrEF, CKD stage IIIb, hepatic cirrhosis, anemia of chronic disease, HTN, HLD, hypothyroidism, chronic lymphedema, gout who presented after a fall at home and admitted with acute on chronic HFmrEF.

## 2024-05-09 NOTE — ED Triage Notes (Signed)
 Pt bib EMS for unwitnessed fall approximately 7hrs ago. Pt was trying to go from her chair to walker and slid from the chair to the ground. Family wasn't home and she wasn't able to call for help. Denies LOC or hitting head. GCS 15, A+Ox4. Complains of L knee and R shoulder pain. Per EMS pt had broken both a couple years ago.

## 2024-05-09 NOTE — ED Provider Notes (Signed)
 [employer] EMERGENCY DEPARTMENT AT [facility] Provider Note   CSN: [id number] Arrival date & time: 05/09/24  1725     Patient presents with: Katelyn   Shameika Jackson is a 78 y.o. female.  {Add pertinent medical, surgical, social history, OB history to HPI:8548} 78 year old female with history of atrial fibrillation on Eliquis , CHF, lymphedema, CKD, and hypertension who presents to the emergency department after a fall.  Patient had unwitnessed fall at home where she reportedly slid out of her chair and landed on the ground.  Unfortunately was on the ground for prolonged time since family was not home.  Complaining of neck pain at this point in time as well as left knee pain and right shoulder pain.  Does have a history of remote injuries in these locations. Per son fell from [facility] and was on the ground 8 hours.   Per son, leg swelling has been getting worse over the past 2 weeks. Was complaining of shortness of breath last week. Compliant with her torsemide  3 times daily. Typically can use wheelchair and walker.        Prior to Admission medications   Medication Sig Start Date End Date Taking? Authorizing Provider  acetaminophen  (TYLENOL ) 500 MG tablet Take 1,000 mg by mouth at bedtime.    [provider]  allopurinol  (ZYLOPRIM ) 100 MG tablet Take 0.5 tablets (50 mg total) by mouth daily. 02/12/24   Plotnikov, [name], MD  apixaban  (ELIQUIS ) 5 MG TABS tablet Take 1 tablet (5 mg total) by mouth 2 (two) times daily. 11/02/23   Plotnikov, [name], MD  azithromycin  (ZITHROMAX  Z-PAK) 250 MG tablet As directed 12/29/23   Plotnikov, Aleksei V, MD  carvedilol  (COREG ) 12.5 MG tablet Take 1 tablet (12.5 mg total) by mouth 2 (two) times daily with a meal. 12/29/23   Plotnikov, [name], MD  ipratropium-albuterol  (DUONEB) 0.5-2.5 (3) MG/3ML SOLN Take 3 mLs by nebulization every 6 (six) hours as needed. 12/27/23   Plotnikov, [name], MD  lactulose  (CHRONULAC ) 10 GM/15ML solution  TAKE 15 MILLILITERS BY MOUTH THREE TIMESDAILY 11/02/23   Plotnikov, Aleksei V, MD  levothyroxine  (SYNTHROID ) 50 MCG tablet Take 1 tablet (50 mcg total) by mouth daily before breakfast. Overdue for Annual appt must see provider for future refills 02/12/24   Plotnikov, [name], MD  magnesium  oxide (MAG-OX) 400 MG tablet Take 1 tablet (400 mg total) by mouth 2 (two) times daily. 11/02/23   Plotnikov, Aleksei V, MD  Menthol -Zinc Oxide (CALMOSEPTINE) 0.44-20.6 % OINT Apply topically. 12/07/23   [provider]  methylPREDNISolone  (MEDROL  DOSEPAK) 4 MG TBPK tablet As directed 12/29/23   Plotnikov, Aleksei V, MD  omeprazole  (PRILOSEC) 40 MG capsule Take 1 capsule (40 mg total) by mouth daily. 02/12/24   Plotnikov, Aleksei V, MD  polyethylene glycol (MIRALAX  / GLYCOLAX ) 17 g packet Take 17 g by mouth 2 (two) times daily. 12/01/23   Arrien, Mauricio Daniel, MD  promethazine -dextromethorphan  (PROMETHAZINE -DM) 6.25-15 MG/5ML syrup Take 5 mLs by mouth 4 (four) times daily as needed for cough. 12/29/23   Plotnikov, Aleksei V, MD  rifaximin  (XIFAXAN ) 550 MG TABS tablet Take 550 mg by mouth 2 (two) times daily.    [provider]  spironolactone  (ALDACTONE ) 25 MG tablet Take 1 tablet (25 mg total) by mouth daily. 02/12/24   Plotnikov, [name], MD  torsemide  (DEMADEX ) 20 MG tablet Take 3 tablets (60 mg total) by mouth daily. 02/12/24   Plotnikov, [name], MD  traMADol  (ULTRAM ) 50 MG  tablet Take 1 tablet (50 mg total) by mouth every 6 (six) hours as needed for severe pain (pain score 7-10). 02/12/24   Plotnikov, Aleksei V, MD  triamcinolone  cream (KENALOG ) 0.1 % Apply 1 Application topically 2 (two) times daily. To dry patches on BLE and bilateral feet 11/02/23   Plotnikov, Aleksei V, MD    Allergies: Chlorhexidine  gluconate, Hibiclens  [chlorhexidine  gluconate], Mevacor  [lovastatin ], Penicillin g, and Penicillins    Review of Systems  Updated Vital Signs BP (!) 157/84   Pulse 75   Temp (!) 97.5 F  (36.4 C) (Tympanic)   Resp 16   Ht 5' 1 (1.549 m)   Wt 73.9 kg   LMP  (LMP Unknown)   BMI 30.78 kg/m   Physical Exam Vitals and nursing note reviewed.  Constitutional:      General: She is not in acute distress.    Appearance: Normal appearance. She is well-developed. She is not ill-appearing.  HENT:     Head: Normocephalic and atraumatic.     Right Ear: External ear normal.     Left Ear: External ear normal.     Nose: Nose normal.     Mouth/Throat:     Mouth: Mucous membranes are moist.     Pharynx: Oropharynx is clear.  Eyes:     Extraocular Movements: Extraocular movements intact.     Conjunctiva/sclera: Conjunctivae normal.     Pupils: Pupils are equal, round, and reactive to light.     Comments: Pupils 5mm BL  Neck:     Comments: In c-collar Cardiovascular:     Rate and Rhythm: Normal rate and regular rhythm.     Pulses: Normal pulses.     Heart sounds: Normal heart sounds. No murmur heard.    Comments: Pacemaker in place Pulmonary:     Effort: Pulmonary effort is normal. No respiratory distress.     Breath sounds: Normal breath sounds.  Abdominal:     General: Abdomen is flat. There is no distension.     Palpations: Abdomen is soft. There is no mass.     Tenderness: There is no abdominal tenderness. There is no guarding.  Musculoskeletal:        General: No deformity. Normal range of motion.     Right lower leg: Edema present.     Left lower leg: Edema present.     Comments: No tenderness to palpation of midline thoracic or lumbar spine.  No step-offs palpated.  No tenderness to palpation of chest wall.  No bruising noted.  No tenderness to palpation of bilateral clavicles.  No tenderness to palpation, bruising, or deformities noted of bilateral elbows, wrists, hips, or ankles.  Tenderness palpation of right shoulder and left knee.  Bruising on the right knee as well.  Skin:    General: Skin is warm and dry.  Neurological:     General: No focal deficit  present.     Mental Status: She is alert and oriented to person, place, and time. Mental status is at baseline.     Cranial Nerves: No cranial nerve deficit.     Sensory: No sensory deficit.     Motor: No weakness.  Psychiatric:        Mood and Affect: Mood normal.     (all labs ordered are listed, but only abnormal results are displayed) Labs Reviewed  CBC WITH DIFFERENTIAL/PLATELET  COMPREHENSIVE METABOLIC PANEL WITH GFR  CK    EKG: None  Radiology: No results found.  {Document cardiac monitor,  telemetry assessment procedure when appropriate:32947} Procedures   Medications Ordered in the ED - No data to display  Clinical Course as of 05/10/24 1154  Thu May 09, 2024  2058 Creatinine(!): 1.65 At baseline [RP]  2224 Dr Tobie from hospitalist consulted for admission.  [RP]    Clinical Course User Index [RP] Yolande Lamar BROCKS, MD   {Click here for ABCD2, HEART and other calculators REFRESH Note before signing:1}                              Medical Decision Making Amount and/or Complexity of Data Reviewed Labs: ordered. Decision-making details documented in ED Course. Radiology: ordered.  Risk Decision regarding hospitalization.   78 year old female with history of atrial fibrillation on Eliquis , CHF, lymphedema, CKD, and hypertension who presents to the emergency department after a fall.   Initial Ddx:  ***   MDM/Course:  *** Upon re-evaluation ***  This patient presents to the ED for concern of complaints listed in HPI, this involves an extensive number of treatment options, and is a complaint that carries with it a high risk of complications and morbidity. Disposition including potential need for admission considered.   Dispo: {Disposition:28069}  Additional history obtained from {Additional History:28067} Records reviewed {Records Reviewed:28068} The following labs were independently interpreted: {labs interpreted:28064} and show {lab  findings:28250} I independently reviewed the following imaging with scope of interpretation limited to determining acute life threatening conditions related to emergency care: {imaging interpreted:28065} and agree with the radiologist interpretation with the following exceptions: none I personally reviewed and interpreted cardiac monitoring: {cardiac monitoring:28251} I personally reviewed and interpreted the pt's EKG: see above for interpretation  I have reviewed the patients home medications and made adjustments as needed Consults: {Consultants:28063} Social Determinants of health:  ***  Portions of this note were generated with Scientist, clinical (histocompatibility and immunogenetics). Dictation errors may occur despite best attempts at proofreading.     Final diagnoses:  None    ED Discharge Orders     None

## 2024-05-09 NOTE — H&P (Signed)
 History and Physical    Katelyn Jackson FMW:[id number] DOB: 03-Sep-1946 DOA: 05/09/2024  PCP: Garald [name], MD  Patient coming from: Home  I have personally briefly reviewed patient's old medical records in [facility]  Chief Complaint: Fall at home, shortness of breath  HPI: Katelyn Jackson is a 78 y.o. female with medical history significant for permanent atrial fibrillation on Eliquis , bradycardia s/p PPM, chronic HFmrEF, CKD stage IIIb, hepatic cirrhosis, anemia of chronic disease, HTN, HLD, hypothyroidism, chronic lymphedema, gout who presented to the ED for evaluation after a fall at home.  Remote video interpreter services utilized to assist with communication.  Patient states that she normally transports using a wheelchair but sometimes will use walker.  She says earlier today she slipped out of her wheelchair falling to the ground.  She says family was at work so she was home alone.  She was unable to get up on her own and states that she was laying on the ground for 7 hours until family were able to come in.  She has chronic back and shoulder pain which had increasingly bothered her.  Patient also reports recent increase in shortness of breath.  She reports new nonproductive cough over the last 2 days.  She has chronic swelling from lymphedema in both lower extremities and has not appreciated any significant increase from her baseline.  She states that she has had good urine output with her torsemide  at home.  ED Course  Labs/Imaging on admission: I have personally reviewed following labs and imaging studies.  Initial vitals showed BP 161/89, pulse 75, RR 19, temp 97.5 F, SpO2 99% on room air.  Labs showed BNP 480.2, CK1 114, sodium 138, potassium 4.6, bicarb 20, BUN 26, creatinine 1.65, serum glucose 93, WBC 4.6, hemoglobin 10.6, platelets 129.  Portable chest x-ray showed cardiomegaly with pulmonary edema.  Extensive traumatic injury imaging was negative for  significant acute injury.  Notable findings on CT right shoulder of age-indeterminate anterior dislocation of the right humeral diaphysis, postsurgical absence of the humeral head, age-indeterminate fracture of the inferior glenoid.  Hospitalist service was consulted to admit.  Review of Systems: All systems reviewed and are negative except as documented in history of present illness above.   Past Medical History:  Diagnosis Date   Acute lower UTI 06/19/2017   Arthritis    Breast lesion 12/07/2021   Cancer (HCC)    breast cancer   Chronic atrial fibrillation (HCC)    Chronic kidney disease    sees Dr Perri   Colonization with VRE (vancomycin -resistant enterococcus) 09/22/2021   Cramps, muscle, general    Dry skin 09/22/2021   Dyspnea on exertion    Dysrhythmia    a-fib,    ESBL E. coli carrier 09/22/2021   GERD (gastroesophageal reflux disease)    Headache    Herpes labialis 08/19/2021   Hyperlipidemia    Hypertension    Hypothyroidism    Lymphedema    Moderate to severe pulmonary hypertension (HCC)    Obesity    Osteoarthritis of right knee 08/19/2021   Personal history of chemotherapy    Personal history of radiation therapy    Pneumonia 12/2020   Presence of permanent cardiac pacemaker 01/21/2021   for bradycardia    Syncope 12/2020   needed a pacemaker   UTI (urinary tract infection) 01/05/2021    Past Surgical History:  Procedure Laterality Date   APPENDECTOMY     BIOPSY  08/27/2023   Procedure: BIOPSY;  Surgeon:  Saintclair Jasper, MD;  Location: [facility] ENDOSCOPY;  Service: Gastroenterology;;   BREAST BIOPSY Left 2018   BREAST LUMPECTOMY Left 04/04/2017   x3   BREAST LUMPECTOMY WITH NEEDLE LOCALIZATION AND AXILLARY SENTINEL LYMPH NODE BX Left 04/04/2017   Procedure: BREAST LUMPECTOMY WITH NEEDLE LOCALIZATION x3 AND AXILLARY SENTINEL LYMPH NODE BX;  Surgeon: Aron Shoulders, MD;  Location: [facility] OR;  Service: General;  Laterality: Left;   CATARACT EXTRACTION W/ INTRAOCULAR  LENS  IMPLANT, BILATERAL Bilateral 2018   COLONOSCOPY     ESOPHAGOGASTRODUODENOSCOPY (EGD) WITH PROPOFOL  N/A 08/27/2023   Procedure: ESOPHAGOGASTRODUODENOSCOPY (EGD) WITH PROPOFOL ;  Surgeon: Saintclair Jasper, MD;  Location: [facility] ENDOSCOPY;  Service: Gastroenterology;  Laterality: N/A;   EYE SURGERY     GLAUCOMA SURGERY Bilateral 2018   HARDWARE REMOVAL Right 07/26/2021   Procedure: RIGHT SHOULDER HARDWARE REMOVAL WITH WASHOUT;  Surgeon: Cristy Bonner DASEN, MD;  Location: [facility] ORS;  Service: Orthopedics;  Laterality: Right;   INSERT / REPLACE / REMOVE PACEMAKER  01/21/2021   IR FLUORO GUIDE CV LINE LEFT  07/29/2021   IR REMOVAL TUN CV CATH W/O FL  09/07/2021   IR US  GUIDE BX ASP/DRAIN  07/16/2021   RE-EXCISION OF BREAST CANCER,SUPERIOR MARGINS Left 04/26/2017   Procedure: RE-EXCISION OF LEFT BREAST CANCER;  Surgeon: Aron Shoulders, MD;  Location: [facility] OR;  Service: General;  Laterality: Left;   SHOULDER HEMI-ARTHROPLASTY Right 03/17/2021   Procedure: SHOULDER HEMI-ARTHROPLASTY;  Surgeon: Cristy Bonner DASEN, MD;  Location: [facility] ORS;  Service: Orthopedics;  Laterality: Right;    Social History: Social History   Tobacco Use   Smoking status: Never   Smokeless tobacco: Never  Vaping Use   Vaping status: Never Used  Substance Use Topics   Alcohol  use: No    Alcohol /week: 0.0 standard drinks of alcohol    Drug use: No  \ Allergies  Allergen Reactions   Chlorhexidine  Gluconate    Hibiclens  [Chlorhexidine  Gluconate] Hives   Mevacor  [Lovastatin ] Other (See Comments)    Myalgias    Penicillin G    Penicillins Itching    Tolerated Cephalosporin Date: 03/17/21. Td Ancef  2g 07/26/21    Family History  Problem Relation Age of Onset   CAD Mother 20   Stroke Mother 48       hemorr CVA   COPD Father 74   Cancer Neg Hx    Breast cancer Neg Hx    BRCA 1/2 Neg Hx      Prior to Admission medications   Medication Sig Start Date End Date Taking? Authorizing Provider  acetaminophen  (TYLENOL ) 500 MG tablet Take 1,000  mg by mouth at bedtime.    [provider]  allopurinol  (ZYLOPRIM ) 100 MG tablet Take 0.5 tablets (50 mg total) by mouth daily. 02/12/24   Plotnikov, [name], MD  apixaban  (ELIQUIS ) 5 MG TABS tablet Take 1 tablet (5 mg total) by mouth 2 (two) times daily. 11/02/23   Plotnikov, [name], MD  azithromycin  (ZITHROMAX  Z-PAK) 250 MG tablet As directed 12/29/23   Plotnikov, Aleksei V, MD  carvedilol  (COREG ) 12.5 MG tablet Take 1 tablet (12.5 mg total) by mouth 2 (two) times daily with a meal. 12/29/23   Plotnikov, [name], MD  ipratropium-albuterol  (DUONEB) 0.5-2.5 (3) MG/3ML SOLN Take 3 mLs by nebulization every 6 (six) hours as needed. 12/27/23   Plotnikov, [name], MD  lactulose  (CHRONULAC ) 10 GM/15ML solution TAKE 15 MILLILITERS BY MOUTH THREE TIMESDAILY 11/02/23   Plotnikov, Aleksei V, MD  levothyroxine  (SYNTHROID ) 50 MCG tablet Take 1  tablet (50 mcg total) by mouth daily before breakfast. Overdue for Annual appt must see provider for future refills 02/12/24   Plotnikov, [name], MD  magnesium  oxide (MAG-OX) 400 MG tablet Take 1 tablet (400 mg total) by mouth 2 (two) times daily. 11/02/23   Plotnikov, [name], MD  Menthol -Zinc Oxide (CALMOSEPTINE) 0.44-20.6 % OINT Apply topically. 12/07/23   [provider]  methylPREDNISolone  (MEDROL  DOSEPAK) 4 MG TBPK tablet As directed 12/29/23   Plotnikov, Aleksei V, MD  omeprazole  (PRILOSEC) 40 MG capsule Take 1 capsule (40 mg total) by mouth daily. 02/12/24   Plotnikov, Aleksei V, MD  polyethylene glycol (MIRALAX  / GLYCOLAX ) 17 g packet Take 17 g by mouth 2 (two) times daily. 12/01/23   Arrien, Elidia Sieving, MD  promethazine -dextromethorphan  (PROMETHAZINE -DM) 6.25-15 MG/5ML syrup Take 5 mLs by mouth 4 (four) times daily as needed for cough. 12/29/23   Plotnikov, Aleksei V, MD  rifaximin  (XIFAXAN ) 550 MG TABS tablet Take 550 mg by mouth 2 (two) times daily.    [provider]  spironolactone  (ALDACTONE ) 25 MG tablet Take 1 tablet (25 mg  total) by mouth daily. 02/12/24   Plotnikov, Aleksei V, MD  torsemide  (DEMADEX ) 20 MG tablet Take 3 tablets (60 mg total) by mouth daily. 02/12/24   Plotnikov, Aleksei V, MD  traMADol  (ULTRAM ) 50 MG tablet Take 1 tablet (50 mg total) by mouth every 6 (six) hours as needed for severe pain (pain score 7-10). 02/12/24   Plotnikov, [name], MD  triamcinolone  cream (KENALOG ) 0.1 % Apply 1 Application topically 2 (two) times daily. To dry patches on BLE and bilateral feet 11/02/23   Plotnikov, [name], MD    Physical Exam: Vitals:   05/09/24 2131 05/09/24 2200 05/09/24 2230 05/09/24 2245  BP: (!) 151/90 (!) 148/87 (!) 149/99   Pulse: 75 75 74 74  Resp: 14 18 (!) 23 15  Temp: 97.7 F (36.5 C)     TempSrc:      SpO2: 100% 100% 97% 100%  Weight:      Height:       Constitutional: Resting supine in bed, NAD, calm, comfortable Eyes: EOMI, lids and conjunctivae normal ENMT: Mucous membranes are moist. Posterior pharynx clear of any exudate or lesions.Normal dentition.  Neck: normal, supple, no masses. Respiratory: clear to auscultation bilaterally, no wheezing, no crackles. Normal respiratory effort. No accessory muscle use.  Cardiovascular: Regular rate and rhythm, no murmurs / rubs / gallops.  Large lymphedema bilateral lower extremities. PPM in place left upper chest with interrogation in process Abdomen: no tenderness, no masses palpated. Musculoskeletal: no clubbing / cyanosis. No joint deformity upper and lower extremities.  Skin: no rashes, lesions, ulcers. No induration Neurologic: Sensation intact. Strength equal bilaterally. Psychiatric: Normal judgment and insight. Alert and oriented x 3. Normal mood.   EKG: Personally reviewed.  V paced rhythm.  Assessment/Plan Principal Problem:   Acute on chronic heart failure with mildly reduced ejection fraction (HFmrEF, 41-49%) (HCC) Active Problems:   Fall at home, initial encounter   Chronic kidney disease, stage 3b (HCC)   Essential  hypertension   Anemia of chronic disease   Liver cirrhosis (HCC)   Hypothyroidism   Cardiac pacemaker in situ   Permanent atrial fibrillation (HCC)   Thrombocytopenia (HCC)   Katelyn Jackson is a 78 y.o. female with medical history significant for permanent atrial fibrillation on Eliquis , bradycardia s/p PPM, chronic HFmrEF, CKD stage IIIb, hepatic cirrhosis, anemia of chronic disease, HTN, HLD, hypothyroidism, chronic lymphedema, gout  who presented after a fall at home and admitted with acute on chronic HFmrEF.  Assessment and Plan: Acute on chronic HFmrEF: Patient presenting with recently increased dyspnea, pulmonary edema on imaging.  Has chronic lymphedema of both lower extremities limiting exam.  TTE 08/23/2023 showed EF 40-45%, global hypokinesis of LV, severe MR, RV dysfunction. - IV Lasix  80 mg twice daily - Continue Coreg  12.5 mg twice daily - Continue spironolactone  25 mg daily - Strict I/O's and daily weights  Permanent atrial fibrillation S/p PPM: V paced rhythm on admission.  PPM interrogation in process.  Continue Coreg  and Eliquis .  Fall at home: Mechanical fall out of wheelchair to floor with prolonged downtime.  CK is not elevated.  Extensive imaging negative for acute traumatic injury. - PT/OT eval, fall precautions  CKD stage IIIb: Renal function stable.  Monitor with diuresis.  Liver cirrhosis: Chronic and stable without evidence of hepatic encephalopathy.  Patient fully alert/oriented and conversing appropriately on admission. - Continue spironolactone , rifaximin , lactulose   Hypertension: Continue Coreg , spironolactone .  Hypothyroidism: Continue Synthroid .  Thrombocytopenia: Mild in setting of hepatic cirrhosis without obvious bleeding.  Anemia of chronic disease: Hemoglobin stable at 10.6.  Gout: Continue allopurinol .   DVT prophylaxis:  apixaban  (ELIQUIS ) tablet 5 mg   Code Status:   Code Status: Limited: Do not attempt resuscitation (DNR)  -DNR-LIMITED -Do Not Intubate/DNI   discussed and confirmed with patient on admission, remote video interpreter services utilized during discussion. Family Communication: Discussed with patient, she has discussed with family Disposition Plan: From home, dispo pending clinical progress Consults called: None Severity of Illness: The appropriate patient status for this patient is OBSERVATION. Observation status is judged to be reasonable and necessary in order to provide the required intensity of service to ensure the patient's safety. The patient's presenting symptoms, physical exam findings, and initial radiographic and laboratory data in the context of their medical condition is felt to place them at decreased risk for further clinical deterioration. Furthermore, it is anticipated that the patient will be medically stable for discharge from the hospital within 2 midnights of admission.   Katelyn Blanch MD [facility]  If 7PM-7AM, please contact night-coverage [email or web address]  05/09/2024, 11:45 PM

## 2024-05-10 ENCOUNTER — Encounter (HOSPITAL_COMMUNITY): Payer: Self-pay | Admitting: Internal Medicine

## 2024-05-10 ENCOUNTER — Observation Stay (HOSPITAL_COMMUNITY)

## 2024-05-10 ENCOUNTER — Other Ambulatory Visit: Payer: Self-pay

## 2024-05-10 DIAGNOSIS — Y92009 Unspecified place in unspecified non-institutional (private) residence as the place of occurrence of the external cause: Secondary | ICD-10-CM | POA: Diagnosis not present

## 2024-05-10 DIAGNOSIS — Z950 Presence of cardiac pacemaker: Secondary | ICD-10-CM | POA: Diagnosis not present

## 2024-05-10 DIAGNOSIS — Z66 Do not resuscitate: Secondary | ICD-10-CM | POA: Diagnosis present

## 2024-05-10 DIAGNOSIS — I130 Hypertensive heart and chronic kidney disease with heart failure and stage 1 through stage 4 chronic kidney disease, or unspecified chronic kidney disease: Secondary | ICD-10-CM | POA: Diagnosis present

## 2024-05-10 DIAGNOSIS — I890 Lymphedema, not elsewhere classified: Secondary | ICD-10-CM | POA: Diagnosis present

## 2024-05-10 DIAGNOSIS — E785 Hyperlipidemia, unspecified: Secondary | ICD-10-CM | POA: Diagnosis present

## 2024-05-10 DIAGNOSIS — Z8249 Family history of ischemic heart disease and other diseases of the circulatory system: Secondary | ICD-10-CM | POA: Diagnosis not present

## 2024-05-10 DIAGNOSIS — Z79890 Hormone replacement therapy (postmenopausal): Secondary | ICD-10-CM | POA: Diagnosis not present

## 2024-05-10 DIAGNOSIS — N1832 Chronic kidney disease, stage 3b: Secondary | ICD-10-CM | POA: Diagnosis present

## 2024-05-10 DIAGNOSIS — W050XXA Fall from non-moving wheelchair, initial encounter: Secondary | ICD-10-CM | POA: Diagnosis present

## 2024-05-10 DIAGNOSIS — I509 Heart failure, unspecified: Secondary | ICD-10-CM

## 2024-05-10 DIAGNOSIS — M549 Dorsalgia, unspecified: Secondary | ICD-10-CM | POA: Diagnosis present

## 2024-05-10 DIAGNOSIS — M19012 Primary osteoarthritis, left shoulder: Secondary | ICD-10-CM | POA: Diagnosis not present

## 2024-05-10 DIAGNOSIS — D6959 Other secondary thrombocytopenia: Secondary | ICD-10-CM | POA: Diagnosis present

## 2024-05-10 DIAGNOSIS — Z7901 Long term (current) use of anticoagulants: Secondary | ICD-10-CM | POA: Diagnosis not present

## 2024-05-10 DIAGNOSIS — Z79899 Other long term (current) drug therapy: Secondary | ICD-10-CM | POA: Diagnosis not present

## 2024-05-10 DIAGNOSIS — I4821 Permanent atrial fibrillation: Secondary | ICD-10-CM | POA: Diagnosis present

## 2024-05-10 DIAGNOSIS — G8929 Other chronic pain: Secondary | ICD-10-CM | POA: Diagnosis present

## 2024-05-10 DIAGNOSIS — E669 Obesity, unspecified: Secondary | ICD-10-CM | POA: Diagnosis present

## 2024-05-10 DIAGNOSIS — I272 Pulmonary hypertension, unspecified: Secondary | ICD-10-CM | POA: Diagnosis present

## 2024-05-10 DIAGNOSIS — M25512 Pain in left shoulder: Secondary | ICD-10-CM | POA: Diagnosis not present

## 2024-05-10 DIAGNOSIS — M109 Gout, unspecified: Secondary | ICD-10-CM | POA: Diagnosis present

## 2024-05-10 DIAGNOSIS — M85812 Other specified disorders of bone density and structure, left shoulder: Secondary | ICD-10-CM | POA: Diagnosis not present

## 2024-05-10 DIAGNOSIS — D631 Anemia in chronic kidney disease: Secondary | ICD-10-CM | POA: Diagnosis present

## 2024-05-10 DIAGNOSIS — M25412 Effusion, left shoulder: Secondary | ICD-10-CM | POA: Diagnosis not present

## 2024-05-10 DIAGNOSIS — K7460 Unspecified cirrhosis of liver: Secondary | ICD-10-CM | POA: Diagnosis present

## 2024-05-10 DIAGNOSIS — E039 Hypothyroidism, unspecified: Secondary | ICD-10-CM | POA: Diagnosis present

## 2024-05-10 DIAGNOSIS — N179 Acute kidney failure, unspecified: Secondary | ICD-10-CM | POA: Diagnosis present

## 2024-05-10 DIAGNOSIS — Z923 Personal history of irradiation: Secondary | ICD-10-CM | POA: Diagnosis not present

## 2024-05-10 DIAGNOSIS — M75102 Unspecified rotator cuff tear or rupture of left shoulder, not specified as traumatic: Secondary | ICD-10-CM | POA: Diagnosis not present

## 2024-05-10 DIAGNOSIS — Z9221 Personal history of antineoplastic chemotherapy: Secondary | ICD-10-CM | POA: Diagnosis not present

## 2024-05-10 DIAGNOSIS — M25712 Osteophyte, left shoulder: Secondary | ICD-10-CM | POA: Diagnosis not present

## 2024-05-10 DIAGNOSIS — I5023 Acute on chronic systolic (congestive) heart failure: Secondary | ICD-10-CM | POA: Diagnosis present

## 2024-05-10 LAB — CBC
HCT: 34.8 % — ABNORMAL LOW (ref 36.0–46.0)
Hemoglobin: 10.7 g/dL — ABNORMAL LOW (ref 12.0–15.0)
MCH: 29 pg (ref 26.0–34.0)
MCHC: 30.7 g/dL (ref 30.0–36.0)
MCV: 94.3 fL (ref 80.0–100.0)
Platelets: 131 K/uL — ABNORMAL LOW (ref 150–400)
RBC: 3.69 MIL/uL — ABNORMAL LOW (ref 3.87–5.11)
RDW: 17.5 % — ABNORMAL HIGH (ref 11.5–15.5)
WBC: 4.7 K/uL (ref 4.0–10.5)
nRBC: 0 % (ref 0.0–0.2)

## 2024-05-10 LAB — BASIC METABOLIC PANEL WITH GFR
Anion gap: 15 (ref 5–15)
BUN: 26 mg/dL — ABNORMAL HIGH (ref 8–23)
CO2: 19 mmol/L — ABNORMAL LOW (ref 22–32)
Calcium: 10.2 mg/dL (ref 8.9–10.3)
Chloride: 106 mmol/L (ref 98–111)
Creatinine, Ser: 1.61 mg/dL — ABNORMAL HIGH (ref 0.44–1.00)
GFR, Estimated: 33 mL/min — ABNORMAL LOW (ref >60)
Glucose, Bld: 88 mg/dL (ref 70–99)
Potassium: 4.1 mmol/L (ref 3.5–5.1)
Sodium: 140 mmol/L (ref 135–145)

## 2024-05-10 LAB — MAGNESIUM: Magnesium: 2.2 mg/dL (ref 1.7–2.4)

## 2024-05-10 MED ORDER — GERHARDT'S BUTT CREAM
TOPICAL_CREAM | Freq: Two times a day (BID) | CUTANEOUS | Status: DC
  Administered 2024-05-12: 1 via TOPICAL
  Filled 2024-05-10: qty 60

## 2024-05-10 MED ORDER — FUROSEMIDE 10 MG/ML IJ SOLN
40.0000 mg | Freq: Two times a day (BID) | INTRAMUSCULAR | Status: DC
  Administered 2024-05-10 – 2024-05-12 (×4): 40 mg via INTRAVENOUS
  Filled 2024-05-10 (×4): qty 4

## 2024-05-10 MED ORDER — POTASSIUM CHLORIDE CRYS ER 20 MEQ PO TBCR
40.0000 meq | EXTENDED_RELEASE_TABLET | Freq: Once | ORAL | Status: AC
  Administered 2024-05-10: 40 meq via ORAL
  Filled 2024-05-10: qty 2

## 2024-05-10 NOTE — Progress Notes (Signed)
 PROGRESS NOTE    Katelyn Jackson  FMW:[id number] DOB: 08/13/1946 DOA: 05/09/2024 PCP: Garald [name], MD   78 chronically ill with permanent atrial fibrillation on Eliquis , bradycardia s/p PPM, chronic HFmrEF, CKD stage IIIb, hepatic cirrhosis, anemia of chronic disease, HTN, HLD, hypothyroidism, chronic lymphedema, gout who presented to the ED for evaluation after a fall at home. normally transports using a wheelchair but sometimes will use walker.  Reports a fall on 7/3 after slipped out of her wheelchair, unable to get up on her own and laid on the floor for 7 hours, chronic back and shoulder pain. -In addition also recent increased dyspnea, cough, chronic lymphedema, compliant with torsemide  -In the ED vital signs stable, CK1 1 4, creatinine 1.6, BNP 480, chest x-ray with cardiomegaly and pulm mild pulmonary edema -Extensive traumatic injury imaging was negative for significant acute injury.  Notable findings on CT right shoulder of age-indeterminate anterior dislocation of the right humeral diaphysis, postsurgical absence of the humeral head, age-indeterminate fracture of the inferior glenoid.   Subjective: -c/o left shoulder and left knee pain, reports that she had surgery on her right shoulder few years ago, does not have increased symptoms on the right side  Assessment and Plan:  Acute on chronic HFmrEF: Patient presenting with recently increased dyspnea, chronic lymphedema, pulmonary edema on imaging.   - TTE 08/23/2023 showed EF 40-45%, global hypokinesis of LV, severe MR, RV dysfunction. - Improving with diuresis, continue IV Lasix  1 more day - Continue Coreg  12.5 mg twice daily - Continue spironolactone  25 mg daily - Strict I/O's and daily weights -Increase activity, PT OT eval   Permanent atrial fibrillation S/p PPM: V paced rhythm on admission.  PPM interrogation in process.  Continue Coreg  and Eliquis .   Fall at home: Mechanical fall out of wheelchair to floor with  prolonged downtime.  CK is not elevated.  Extensive imaging negative for acute traumatic injury.  Age indeterminant findings involving right humerus, site of prior shoulder hemiarthroplasty in 5/22, does not have any new symptoms on the right side -Reports left shoulder and knee pain, check x-ray of left shoulder -Left knee x-ray with advanced osteoarthritis - PT/OT eval, fall precautions   CKD stage IIIb: Renal function stable.  Monitor with diuresis.   Liver cirrhosis: Chronic and stable without evidence of hepatic encephalopathy.  - Continue spironolactone , Lasix , rifaximin , lactulose    Hypertension: Continue Coreg , spironolactone .   Hypothyroidism: Continue Synthroid .   Thrombocytopenia: Mild in setting of hepatic cirrhosis without obvious bleeding.   Anemia of chronic disease: Hemoglobin stable at 10.6.   Gout: Continue allopurinol .   DVT prophylaxis:  apixaban  (ELIQUIS ) tablet 5 mg   Code Status:   DNR  family Communication: None present Disposition Plan: Home tomorrow if stable  Consultants:    Procedures:   Antimicrobials:    Objective: Vitals:   05/10/24 0700 05/10/24 0745 05/10/24 0800 05/10/24 0900  BP: (!) 147/78  (!) 140/70 109/88  Pulse: 75  79 77  Resp: 18  19 (!) 21  Temp:  98.2 F (36.8 C)    TempSrc:  Oral    SpO2: 100%  93% 100%  Weight:      Height:        Intake/Output Summary (Last 24 hours) at 05/10/2024 1118 Last data filed at 05/10/2024 1100 Gross per 24 hour  Intake --  Output 1200 ml  Net -1200 ml   Filed Weights   05/09/24 1735  Weight: 73.9 kg    Examination:  Gen: Awake,  Alert, Oriented X 3,  HEENT: Neck obese unable to assess JVD Lungs: Good air movement bilaterally, CTAB CVS: S1S2/RRR Abd: soft, Non tender, non distended, BS present Extremities: 1-2+ edema, chronic lymphedema changes Right shoulder with deformity but no tenderness with range of motion, left shoulder with mild tenderness and discomfort noted with  range of motion Skin: no new rashes on exposed skin     Data Reviewed:   CBC: Recent Labs  Lab 05/09/24 1823 05/10/24 0513  WBC 4.6 4.7  NEUTROABS 3.1  --   HGB 10.6* 10.7*  HCT 34.9* 34.8*  MCV 95.9 94.3  PLT 129* 131*   Basic Metabolic Panel: Recent Labs  Lab 05/09/24 1823 05/10/24 0513  NA 138 140  K 4.6 4.1  CL 107 106  CO2 20* 19*  GLUCOSE 93 88  BUN 26* 26*  CREATININE 1.65* 1.61*  CALCIUM 10.2 10.2  MG  --  2.2   GFR: Estimated Creatinine Clearance: 26.5 mL/min (A) (by C-G formula based on SCr of 1.61 mg/dL (H)). Liver Function Tests: Recent Labs  Lab 05/09/24 1823  AST 19  ALT 16  ALKPHOS 139*  BILITOT 1.4*  PROT 7.1  ALBUMIN 3.8   No results for input(s): LIPASE, AMYLASE in the last 168 hours. No results for input(s): AMMONIA in the last 168 hours. Coagulation Profile: No results for input(s): INR, PROTIME in the last 168 hours. Cardiac Enzymes: Recent Labs  Lab 05/09/24 1823  CKTOTAL 114   BNP (last 3 results) No results for input(s): PROBNP in the last 8760 hours. HbA1C: No results for input(s): HGBA1C in the last 72 hours. CBG: No results for input(s): GLUCAP in the last 168 hours. Lipid Profile: No results for input(s): CHOL, HDL, LDLCALC, TRIG, CHOLHDL, LDLDIRECT in the last 72 hours. Thyroid  Function Tests: No results for input(s): TSH, T4TOTAL, FREET4, T3FREE, THYROIDAB in the last 72 hours. Anemia Panel: No results for input(s): VITAMINB12, FOLATE, FERRITIN, TIBC, IRON , RETICCTPCT in the last 72 hours. Urine analysis:    Component Value Date/Time   COLORURINE YELLOW 08/20/2023 2246   APPEARANCEUR TURBID (A) 08/20/2023 2246   LABSPEC 1.010 08/20/2023 2246   PHURINE 6.0 08/20/2023 2246   GLUCOSEU NEGATIVE 08/20/2023 2246   GLUCOSEU NEGATIVE 07/11/2017 1108   HGBUR SMALL (A) 08/20/2023 2246   BILIRUBINUR NEGATIVE 08/20/2023 2246   KETONESUR NEGATIVE 08/20/2023 2246    PROTEINUR 30 (A) 08/20/2023 2246   UROBILINOGEN 0.2 07/11/2017 1108   NITRITE NEGATIVE 08/20/2023 2246   LEUKOCYTESUR LARGE (A) 08/20/2023 2246   Sepsis Labs: @LABRCNTIP (procalcitonin:4,lacticidven:4)  )No results found for this or any previous visit (from the past 240 hours).   Radiology Studies: CT Shoulder Right Wo Contrast Result Date: 05/09/2024 CLINICAL DATA:  Right shoulder injury from fall. EXAM: CT OF THE UPPER RIGHT EXTREMITY WITHOUT CONTRAST TECHNIQUE: Multidetector CT imaging of the upper right extremity was performed according to the standard protocol. RADIATION DOSE REDUCTION: This exam was performed according to the departmental dose-optimization program which includes automated exposure control, adjustment of the mA and/or kV according to patient size and/or use of iterative reconstruction technique. COMPARISON:  Same day radiograph FINDINGS: Bones/Joint/Cartilage The humeral head is not visualized, presumed postsurgical. Age indeterminate anterior dislocation of the humeral diaphysis. Age indeterminate fracture of the inferior glenoid (series 7/image 61). Advanced degenerative changes about the glenoid with moderate effusion. Ligaments Suboptimally assessed by CT. Muscles and Tendons Diffuse rotator cuff and deltoid atrophy. Soft tissues Interlobular septal thickening in the right lung suspicious for interstitial edema.  Small right pleural effusion. IMPRESSION: 1. Age indeterminate anterior dislocation of the humeral diaphysis. Postsurgical absence of the humeral head. 2. Age indeterminate fracture of the inferior glenoid. 3. Advanced degenerative changes about the glenoid with moderate effusion. 4. Interlobular septal thickening in the right lung suspicious for interstitial edema. Small right pleural effusion. Electronically Signed   By: Norman Gatlin M.D.   On: 05/09/2024 19:39   CT Head Wo Contrast Result Date: 05/09/2024 CLINICAL DATA:  Head trauma, fall EXAM: CT HEAD WITHOUT  CONTRAST CT CERVICAL SPINE WITHOUT CONTRAST TECHNIQUE: Multidetector CT imaging of the head and cervical spine was performed following the standard protocol without intravenous contrast. Multiplanar CT image reconstructions of the cervical spine were also generated. RADIATION DOSE REDUCTION: This exam was performed according to the departmental dose-optimization program which includes automated exposure control, adjustment of the mA and/or kV according to patient size and/or use of iterative reconstruction technique. COMPARISON:  08/20/2023 FINDINGS: CT HEAD FINDINGS Brain: No evidence of acute infarction, hemorrhage, hydrocephalus, extra-axial collection or mass lesion/mass effect. Periventricular white matter hypodensity. Vascular: No hyperdense vessel or unexpected calcification. Skull: Normal. Negative for fracture or focal lesion. Sinuses/Orbits: No acute finding. Other: None. CT CERVICAL SPINE FINDINGS Alignment: Degenerative straightening of the normal cervical lordosis. Skull base and vertebrae: No acute fracture. No primary bone lesion or focal pathologic process. Soft tissues and spinal canal: No prevertebral fluid or swelling. No visible canal hematoma. Disc levels: Moderate to severe multilevel cervical disc degenerative disease from C3-C7. Upper chest: Negative. Other: None. IMPRESSION: 1. No acute intracranial pathology. Small-vessel white matter disease. 2. No fracture or static subluxation of the cervical spine. 3. Moderate to severe multilevel cervical disc degenerative disease from C3-C7. Electronically Signed   By: Marolyn JONETTA Jaksch M.D.   On: 05/09/2024 19:08   CT Cervical Spine Wo Contrast Result Date: 05/09/2024 CLINICAL DATA:  Head trauma, fall EXAM: CT HEAD WITHOUT CONTRAST CT CERVICAL SPINE WITHOUT CONTRAST TECHNIQUE: Multidetector CT imaging of the head and cervical spine was performed following the standard protocol without intravenous contrast. Multiplanar CT image reconstructions of the  cervical spine were also generated. RADIATION DOSE REDUCTION: This exam was performed according to the departmental dose-optimization program which includes automated exposure control, adjustment of the mA and/or kV according to patient size and/or use of iterative reconstruction technique. COMPARISON:  08/20/2023 FINDINGS: CT HEAD FINDINGS Brain: No evidence of acute infarction, hemorrhage, hydrocephalus, extra-axial collection or mass lesion/mass effect. Periventricular white matter hypodensity. Vascular: No hyperdense vessel or unexpected calcification. Skull: Normal. Negative for fracture or focal lesion. Sinuses/Orbits: No acute finding. Other: None. CT CERVICAL SPINE FINDINGS Alignment: Degenerative straightening of the normal cervical lordosis. Skull base and vertebrae: No acute fracture. No primary bone lesion or focal pathologic process. Soft tissues and spinal canal: No prevertebral fluid or swelling. No visible canal hematoma. Disc levels: Moderate to severe multilevel cervical disc degenerative disease from C3-C7. Upper chest: Negative. Other: None. IMPRESSION: 1. No acute intracranial pathology. Small-vessel white matter disease. 2. No fracture or static subluxation of the cervical spine. 3. Moderate to severe multilevel cervical disc degenerative disease from C3-C7. Electronically Signed   By: Marolyn JONETTA Jaksch M.D.   On: 05/09/2024 19:08   DG Knee Complete 4 Views Left Result Date: 05/09/2024 CLINICAL DATA:  Knee pain.  Fall. EXAM: LEFT KNEE - COMPLETE 4+ VIEW COMPARISON:  01/24/2024 FINDINGS: No acute fracture or dislocation. Chronic advanced tricompartmental osteoarthritis with joint space narrowing, spurring and articular irregularity. Chronic ununited patellar fracture. No significant joint  effusion. Generalized soft tissue edema. IMPRESSION: 1. No acute fracture or dislocation. 2. Chronic advanced tricompartmental osteoarthritis. 3. Chronic ununited patellar fracture. Electronically Signed   By:  Andrea Gasman M.D.   On: 05/09/2024 18:39   DG Knee Complete 4 Views Right Result Date: 05/09/2024 CLINICAL DATA:  Knee pain.  Fall. EXAM: RIGHT KNEE - COMPLETE 4+ VIEW COMPARISON:  08/20/2023 FINDINGS: No acute fracture or dislocation. Tricompartmental osteoarthritis with joint space narrowing, spurring and articular irregularity. No significant joint effusion. No erosive change. Generalized soft tissue edema. IMPRESSION: 1. No acute fracture or dislocation of the right knee. 2. Advanced tricompartmental osteoarthritis. Electronically Signed   By: Andrea Gasman M.D.   On: 05/09/2024 18:38   DG Shoulder Right Result Date: 05/09/2024 CLINICAL DATA:  Pain after fall. EXAM: RIGHT SHOULDER - 2+ VIEW COMPARISON:  Shoulder radiograph 07/26/2021. Previous chest radiographs reviewed. FINDINGS: Chronic deformity of the right proximal humerus with absence of the humeral head, possibly postsurgical. There is no evidence of acute fracture. Chronic widening of the acromioclavicular joint. IMPRESSION: 1. No acute fracture of the right shoulder. 2. Chronic deformity of the right proximal humerus with absence of the humeral head, possibly postsurgical. Electronically Signed   By: Andrea Gasman M.D.   On: 05/09/2024 18:35   DG Pelvis Portable Result Date: 05/09/2024 CLINICAL DATA:  Pain after fall. EXAM: PORTABLE PELVIS 1-2 VIEWS COMPARISON:  08/20/2023 FINDINGS: The bones are subjectively under mineralized. The cortical margins of the bony pelvis are intact. No fracture. Pubic symphysis and sacroiliac joints are congruent. Both femoral heads are well-seated in the respective acetabula. IMPRESSION: No pelvic fracture. Electronically Signed   By: Andrea Gasman M.D.   On: 05/09/2024 18:34   DG Chest Portable 1 View Result Date: 05/09/2024 CLINICAL DATA:  Pain after fall. EXAM: PORTABLE CHEST 1 VIEW COMPARISON:  11/24/2023 FINDINGS: Left-sided pacemaker remains in place. Cardiomegaly is stable. Mediastinal contours  are unchanged. Diffuse peribronchial and interstitial thickening suspicious for pulmonary edema. No pneumothorax. No significant pleural effusion. Chronic right proximal humeral deformity. Chronic left shoulder arthropathy. On limited assessment, no acute osseous findings. IMPRESSION: Cardiomegaly with diffuse peribronchial and interstitial thickening suspicious for pulmonary edema. Electronically Signed   By: Andrea Gasman M.D.   On: 05/09/2024 18:26     Scheduled Meds:  allopurinol   50 mg Oral Daily   apixaban   5 mg Oral BID   carvedilol   12.5 mg Oral BID WC   furosemide   80 mg Intravenous Q12H   lactulose   10 g Oral TID   levothyroxine   50 mcg Oral QAC breakfast   magnesium  oxide  400 mg Oral BID   pantoprazole   40 mg Oral Daily   rifaximin   550 mg Oral BID   sodium chloride  flush  3 mL Intravenous Q12H   spironolactone   25 mg Oral Daily   Continuous Infusions:   LOS: 0 days    Time spent:    Sigurd Pac, MD [facility]   05/10/2024, 11:18 AM

## 2024-05-10 NOTE — Evaluation (Signed)
 Physical Therapy Evaluation Patient Details Name: Katelyn Jackson MRN: [id number] DOB: Apr 11, 1946 Today's Date: 05/10/2024  History of Present Illness  Pt is a 78 y.o. female presenting 05/09/24 from home with unwitnessed fall, c/o L knee and R shoulder pain. Xrays neg, though visualization difficult L shoulder. PMH: afib on Eliquis  s/p PPM, hypothyroidism, pacemaker, CKD IIIb, NASH cirrhosis, HTN, HLD, CHF, breast cancer, chronic lymphedema, gout, anemia, lymphedema, multiple fractures from multiple falls.  Clinical Impression  Pt admitted with above diagnosis. Pt from home with son and daughter in law but is alone at times and has fallen multiple times before this with numerous injuries. Her BLE lymphedema makes if difficult to move her LE's in the bed and in standing and this is exacerbated by current L knee and shoulder pain from falling. Needed mod A to come to EOB and min A with RW to pivot to recliner. Did not advance ambulation due to decreased safety and pt fatigues so quickly in standing. Patient will benefit from continued inpatient follow up therapy, <3 hours/day.   Pt currently with functional limitations due to the deficits listed below (see PT Problem List). Pt will benefit from acute skilled PT to increase their independence and safety with mobility to allow discharge.           If plan is discharge home, recommend the following: A little help with walking and/or transfers;A little help with bathing/dressing/bathroom;Assistance with cooking/housework;Assist for transportation;Help with stairs or ramp for entrance;Assistance with feeding   Can travel by private vehicle   No    Equipment Recommendations None recommended by PT  Recommendations for Other Services  OT consult    Functional Status Assessment Patient has had a recent decline in their functional status and demonstrates the ability to make significant improvements in function in a reasonable and predictable amount of  time.     Precautions / Restrictions Precautions Precautions: Fall Recall of Precautions/Restrictions: Impaired Precaution/Restrictions Comments: has had many falls Restrictions Weight Bearing Restrictions Per Provider Order: No      Mobility  Bed Mobility Overal bed mobility: Needs Assistance Bed Mobility: Supine to Sit     Supine to sit: Mod assist     General bed mobility comments: mod A to LE's with use of bed pad. Mod A at trunk with use of rail    Transfers Overall transfer level: Needs assistance Equipment used: Rolling walker (2 wheels) Transfers: Sit to/from Stand, Bed to chair/wheelchair/BSC Sit to Stand: Min assist   Step pivot transfers: Min assist       General transfer comment: pt able to take 4 steps but with great difficulty and fatigues fast, needed to sit at that point. Fall risk remains very high    Ambulation/Gait               General Gait Details: did not progress ambulation due to pain and weakness and decreased safety  Stairs            Wheelchair Mobility     Tilt Bed    Modified Rankin (Stroke Patients Only)       Balance Overall balance assessment: Needs assistance Sitting-balance support: Feet supported, Single extremity supported Sitting balance-Leahy Scale: Fair     Standing balance support: Bilateral upper extremity supported, During functional activity, Reliant on assistive device for balance Standing balance-Leahy Scale: Poor Standing balance comment: heavily reliant on UE support  Pertinent Vitals/Pain Pain Assessment Pain Assessment: Faces Faces Pain Scale: Hurts little more Pain Location: B knees and L shoulder Pain Descriptors / Indicators: Aching, Sore Pain Intervention(s): Limited activity within patient's tolerance, Monitored during session    Home Living Family/patient expects to be discharged to:: Private residence Living Arrangements:  Children Available Help at Discharge: Family;Available PRN/intermittently Type of Home: House Home Access: Ramped entrance       Home Layout: Multi-level;Able to live on main level with bedroom/bathroom Home Equipment: Tub bench;Wheelchair - Forensic psychologist (2 wheels);Rollator (4 wheels) Additional Comments: multiple falls and rehab admissions    Prior Function Prior Level of Function : Needs assist             Mobility Comments: Uses wheelchair mostly. Walking very short distances with RW, Doorframe of Bathroom to shower/toilet with assist. ADLs Comments: States her son and DIL assist with bathing and dressing.  DIL completes meals and iADL     Extremity/Trunk Assessment   Upper Extremity Assessment Upper Extremity Assessment: Defer to OT evaluation;RUE deficits/detail RUE Deficits / Details: old R shoulder injury, very limited ROM, pt uses L hand to assist R    Lower Extremity Assessment Lower Extremity Assessment: Generalized weakness;RLE deficits/detail;LLE deficits/detail RLE Deficits / Details: hip flex 2/5, knee ext 3/5, chronic lymphedema RLE Sensation: decreased proprioception RLE Coordination: decreased gross motor LLE Deficits / Details: hip flex 2-/5, knee ext 3-/5, pt with knee pain with mvmt of LLE. chronic lymphedema LLE Sensation: decreased proprioception LLE Coordination: decreased gross motor    Cervical / Trunk Assessment Cervical / Trunk Assessment: Other exceptions Cervical / Trunk Exceptions: morbid obesity  Communication   Communication Communication: No apparent difficulties Factors Affecting Communication: Non - English speaking, interpreter not available (but pt speaks basic [place])    Cognition Arousal: Alert Behavior During Therapy: WFL for tasks assessed/performed   PT - Cognitive impairments: No apparent impairments                       PT - Cognition Comments: limited testing due to pt speaking very basic English  but answered questions appropriately within scope of vocabulary Following commands: Intact       Cueing Cueing Techniques: Verbal cues     General Comments General comments (skin integrity, edema, etc.): HR 77 bpm, BP 130/86    Exercises     Assessment/Plan    PT Assessment Patient needs continued PT services  PT Problem List Decreased strength;Decreased range of motion;Decreased activity tolerance;Decreased balance;Decreased mobility;Decreased coordination;Pain       PT Treatment Interventions DME instruction;Gait training;Functional mobility training;Therapeutic activities;Therapeutic exercise;Balance training;Neuromuscular re-education;Patient/family education    PT Goals (Current goals can be found in the Care Plan section)  Acute Rehab PT Goals Patient Stated Goal: return home PT Goal Formulation: With patient Time For Goal Achievement: 05/24/24 Potential to Achieve Goals: Fair    Frequency Min 2X/week     Co-evaluation               AM-PAC PT 6 Clicks Mobility  Outcome Measure Help needed turning from your back to your side while in a flat bed without using bedrails?: A Little Help needed moving from lying on your back to sitting on the side of a flat bed without using bedrails?: A Lot Help needed moving to and from a bed to a chair (including a wheelchair)?: A Lot Help needed standing up from a chair using your arms (e.g., wheelchair or bedside chair)?: A Lot  Help needed to walk in hospital room?: Total Help needed climbing 3-5 steps with a railing? : Total 6 Click Score: 11    End of Session Equipment Utilized During Treatment: Gait belt Activity Tolerance: Patient tolerated treatment well Patient left: in chair;with call bell/phone within reach;with chair alarm set Nurse Communication: Mobility status PT Visit Diagnosis: Repeated falls (R29.6);Muscle weakness (generalized) (M62.81);Difficulty in walking, not elsewhere classified  (R26.2);Pain;Unsteadiness on feet (R26.81) Pain - Right/Left: Left Pain - part of body: Shoulder;Knee    Time: 8754-8696 PT Time Calculation (min) (ACUTE ONLY): 18 min   Charges:   PT Evaluation $PT Eval Moderate Complexity: 1 Mod   PT General Charges $$ ACUTE PT VISIT: 1 Visit         Richerd Lipoma, PT  Acute Rehab Services Secure chat preferred Office [phone]   Richerd CROME Malajah Oceguera 05/10/2024, 2:19 PM

## 2024-05-10 NOTE — ED Notes (Signed)
RN setup pt breakfast tray

## 2024-05-10 NOTE — Plan of Care (Signed)
   Problem: Clinical Measurements: Goal: Cardiovascular complication will be avoided Outcome: Progressing

## 2024-05-10 NOTE — ED Notes (Signed)
 Unsuccessful attempt to interrogate pacemaker x3 by this RN, next RN informed interrogation still needed.

## 2024-05-10 NOTE — Consult Note (Addendum)
 WOC Nurse Consult Note: Reason for Consult: sacral wounds and wound underneath abdominal folds/leg folds  Wound type: 1.  Moisture Associated Skin Damage to sacrum/coccyx/buttocks with linear full thickness wound to coccyx  ICD-10 CM Codes for Irritant Dermatitis  L24A2 - Due to fecal, urinary or dual incontinence L24A9 - Due to friction or contact with other specified body fluids  2.  Intertriginous dermatitis underneath abdominal folds and folds in legs erythema with partial thickness skin loss r/t moisture and friction  ICD-10 CM Codes for Irritant Dermatitis    L30.4  - Erythema intertrigo. Also used for abrasion of the hand, chafing of the skin, dermatitis due to sweating and friction, friction dermatitis, friction eczema, and genital/thigh intertrigo.  Pressure Injury POA: NA appears related to moisture  Measurement: see nursing flowsheet; widespread under abdominal folds  Wound bed: erythema to sacrum/buttocks with linear full thickness wound to coccyx red and moist  Drainage (amount, consistency, odor) serosanguinous noted to coccyx wound on old foam  Periwound: erythema  Dressing procedure/placement/frequency:  Cleanse sacrum/coccyx/buttocks with Vashe wound cleanser Soila [phone]) do not rinse and allow to air dry.  Apply silver hydrofiber ([place] [phone]) to open wound bed coccyx daily and secure with silicone foam or ABd pad whichever is preferred.  Will write for Gerhardt's Butt Cream 2 times daily to surrounding intact skin of sacrum/buttocks Cleanse underneath abdominal folds and skin folds of lower legs with Vashe wound cleanser Soila [phone]) do not rinse and allow to air dry.  Apply Minna ARTHURS Order Gerlean # [id number] as follows (would order 1 piece for under abdominal folds and 1 piece to cut and place in folds of leg as needed)  Measure and cut length of InterDry to fit in skin folds that have skin breakdown Tuck InterDry fabric into skin folds in a single layer, allow for  2 inches of overhang from skin edges to allow for wicking to occur May remove to bathe; dry area thoroughly and then tuck into affected areas again Do not apply any creams or ointments when using InterDry DO NOT THROW AWAY FOR 5 DAYS unless soiled with stool DO NOT [facility] product, this will inactivate the silver in the material  New sheet of Interdry should be applied after 5 days of use if patient continues to have skin breakdown    POC discussed with bedside nurse.  Appreciate D. Jerona, RN assistance with this consult.    WOC team will not follow. Re-consult if further needs arise.   Thank you,    Powell Bar MSN, RN-BC, [employer]

## 2024-05-11 ENCOUNTER — Inpatient Hospital Stay (HOSPITAL_COMMUNITY)

## 2024-05-11 DIAGNOSIS — I5023 Acute on chronic systolic (congestive) heart failure: Secondary | ICD-10-CM | POA: Diagnosis not present

## 2024-05-11 LAB — BASIC METABOLIC PANEL WITH GFR
Anion gap: 10 (ref 5–15)
BUN: 27 mg/dL — ABNORMAL HIGH (ref 8–23)
CO2: 23 mmol/L (ref 22–32)
Calcium: 9.8 mg/dL (ref 8.9–10.3)
Chloride: 107 mmol/L (ref 98–111)
Creatinine, Ser: 1.76 mg/dL — ABNORMAL HIGH (ref 0.44–1.00)
GFR, Estimated: 29 mL/min — ABNORMAL LOW (ref >60)
Glucose, Bld: 102 mg/dL — ABNORMAL HIGH (ref 70–99)
Potassium: 3.9 mmol/L (ref 3.5–5.1)
Sodium: 140 mmol/L (ref 135–145)

## 2024-05-11 MED ORDER — MIDODRINE HCL 5 MG PO TABS
10.0000 mg | ORAL_TABLET | Freq: Once | ORAL | Status: AC
  Administered 2024-05-11: 10 mg via ORAL
  Filled 2024-05-11: qty 2

## 2024-05-11 NOTE — Progress Notes (Signed)
 PROGRESS NOTE    Katelyn Jackson  FMW:[id number] DOB: [birth year] DOA: 05/09/2024 PCP: Garald [name], MD   78 chronically ill with permanent atrial fibrillation on Eliquis , bradycardia s/p PPM, chronic HFmrEF, CKD stage IIIb, hepatic cirrhosis, anemia of chronic disease, HTN, HLD, hypothyroidism, chronic lymphedema, gout who presented to the ED for evaluation after a fall at home. normally transports using a wheelchair but sometimes will use walker.  Reports a fall on 7/3 after slipped out of her wheelchair, unable to get up on her own and laid on the floor for 7 hours, chronic back and shoulder pain. -In addition also recent increased dyspnea, cough, chronic lymphedema, compliant with torsemide  -In the ED vital signs stable, CK1 1 4, creatinine 1.6, BNP 480, chest x-ray with cardiomegaly and pulm mild pulmonary edema -Extensive traumatic injury imaging was negative for significant acute injury.  Notable findings on CT right shoulder of age-indeterminate anterior dislocation of the right humeral diaphysis, postsurgical absence of the humeral head, age-indeterminate fracture of the inferior glenoid.   Subjective: - Feels better overall, breathing is improving, left shoulder still with some pain and limited range of motion  Assessment and Plan:  Acute on chronic HFmrEF: Patient presenting with recently increased dyspnea, chronic lymphedema, pulmonary edema on imaging.   - TTE 08/23/2023 showed EF 40-45%, global hypokinesis of LV, severe MR, RV dysfunction. - Improving with diuresis, 3.5 L negative, continue IV Lasix  1 more day, switch to oral torsemide  tomorrow - Continue Coreg  12.5 mg twice daily - Continue spironolactone  25 mg daily, poor candidate for SGLT2i - Strict I/O's and daily weights -Increase activity, PT OT eval   Permanent atrial fibrillation S/p PPM: V paced rhythm on admission.  PPM interrogation in process.  Continue Coreg  and Eliquis .   Fall at home: Mechanical fall  out of wheelchair to floor with prolonged downtime.  CK is not elevated.  Extensive imaging negative for acute traumatic injury.  Age indeterminant findings involving right humerus, site of prior shoulder hemiarthroplasty in 5/22, does not have any new symptoms on the right side -Reports left shoulder pain, x-ray unremarkable but CT recommended, CT done this morning, results pending -Left knee x-ray with advanced osteoarthritis - PT/OT eval, fall precautions -Patient declined short-term rehab, will set up home health services   CKD stage IIIb: Renal function stable.     Liver cirrhosis: Chronic and stable without evidence of hepatic encephalopathy.  - Continue spironolactone , Lasix , rifaximin , lactulose    Hypertension: Continue Coreg , spironolactone .   Hypothyroidism: Continue Synthroid .   Thrombocytopenia: Mild in setting of hepatic cirrhosis without obvious bleeding.   Anemia of chronic disease: Hemoglobin stable at 10.6.   Gout: Continue allopurinol .   DVT prophylaxis:  apixaban  (ELIQUIS ) tablet 5 mg   Code Status:   DNR  family Communication: None present Disposition Plan: Home tomorrow if stable  Consultants:    Procedures:   Antimicrobials:    Objective: Vitals:   05/10/24 1930 05/10/24 2308 05/11/24 0440 05/11/24 0900  BP: 119/64 132/77 129/84 109/64  Pulse: 75  74   Resp: 19 17 17 17   Temp: 98.3 F (36.8 C) 98.5 F (36.9 C) 98.4 F (36.9 C) 98.6 F (37 C)  TempSrc: Oral Oral Oral Oral  SpO2: 100% 99% 100% 100%  Weight:   80.8 kg   Height:        Intake/Output Summary (Last 24 hours) at 05/11/2024 1101 Last data filed at 05/11/2024 1000 Gross per 24 hour  Intake 120 ml  Output 3225 ml  Net -3105 ml   Filed Weights   05/09/24 1735 05/10/24 1221 05/11/24 0440  Weight: 73.9 kg 83.4 kg 80.8 kg    Examination:  Gen: Awake, Alert, Oriented X 3,  HEENT: Neck obese unable to assess JVD Lungs: Decreased breath sounds at the bases CVS:  S1S2/RRR Abd: soft, Non tender, non distended, BS present Extremities: 1+ edema, chronic lymphedema changes Right shoulder with deformity but no tenderness with range of motion, left shoulder with mild tenderness and discomfort noted with range of motion Skin: no new rashes on exposed skin     Data Reviewed:   CBC: Recent Labs  Lab 05/09/24 1823 05/10/24 0513  WBC 4.6 4.7  NEUTROABS 3.1  --   HGB 10.6* 10.7*  HCT 34.9* 34.8*  MCV 95.9 94.3  PLT 129* 131*   Basic Metabolic Panel: Recent Labs  Lab 05/09/24 1823 05/10/24 0513 05/11/24 0220  NA 138 140 140  K 4.6 4.1 3.9  CL 107 106 107  CO2 20* 19* 23  GLUCOSE 93 88 102*  BUN 26* 26* 27*  CREATININE 1.65* 1.61* 1.76*  CALCIUM 10.2 10.2 9.8  MG  --  2.2  --    GFR: Estimated Creatinine Clearance: 24.8 mL/min (A) (by C-G formula based on SCr of 1.76 mg/dL (H)). Liver Function Tests: Recent Labs  Lab 05/09/24 1823  AST 19  ALT 16  ALKPHOS 139*  BILITOT 1.4*  PROT 7.1  ALBUMIN 3.8   No results for input(s): LIPASE, AMYLASE in the last 168 hours. No results for input(s): AMMONIA in the last 168 hours. Coagulation Profile: No results for input(s): INR, PROTIME in the last 168 hours. Cardiac Enzymes: Recent Labs  Lab 05/09/24 1823  CKTOTAL 114   BNP (last 3 results) No results for input(s): PROBNP in the last 8760 hours. HbA1C: No results for input(s): HGBA1C in the last 72 hours. CBG: No results for input(s): GLUCAP in the last 168 hours. Lipid Profile: No results for input(s): CHOL, HDL, LDLCALC, TRIG, CHOLHDL, LDLDIRECT in the last 72 hours. Thyroid  Function Tests: No results for input(s): TSH, T4TOTAL, FREET4, T3FREE, THYROIDAB in the last 72 hours. Anemia Panel: No results for input(s): VITAMINB12, FOLATE, FERRITIN, TIBC, IRON , RETICCTPCT in the last 72 hours. Urine analysis:    Component Value Date/Time   COLORURINE YELLOW 08/20/2023 2246    APPEARANCEUR TURBID (A) 08/20/2023 2246   LABSPEC 1.010 08/20/2023 2246   PHURINE 6.0 08/20/2023 2246   GLUCOSEU NEGATIVE 08/20/2023 2246   GLUCOSEU NEGATIVE 07/11/2017 1108   HGBUR SMALL (A) 08/20/2023 2246   BILIRUBINUR NEGATIVE 08/20/2023 2246   KETONESUR NEGATIVE 08/20/2023 2246   PROTEINUR 30 (A) 08/20/2023 2246   UROBILINOGEN 0.2 07/11/2017 1108   NITRITE NEGATIVE 08/20/2023 2246   LEUKOCYTESUR LARGE (A) 08/20/2023 2246   Sepsis Labs: @LABRCNTIP (procalcitonin:4,lacticidven:4)  )No results found for this or any previous visit (from the past 240 hours).   Radiology Studies: DG Shoulder Left Result Date: 05/10/2024 CLINICAL DATA:  Unwitnessed fall 7 hours ago.  Shoulder pain. EXAM: LEFT SHOULDER - 2+ VIEW COMPARISON:  None Available. FINDINGS: No evidence for an acute fracture. Acromioclavicular joint not well demonstrated on the current study due to positioning. Degenerative changes are noted in the glenohumeral joint. No evidence for shoulder dislocation. Apparent narrowing of the acromial humeral distance suggests chronic rotator cuff pathology. IMPRESSION: Study limited by osteopenia and positioning. While no gross fracture or dislocation evident, CT or MR imaging could be used to further evaluate as clinically warranted. Electronically Signed  By: Camellia Candle M.D.   On: 05/10/2024 12:08   CT Shoulder Right Wo Contrast Result Date: 05/09/2024 CLINICAL DATA:  Right shoulder injury from fall. EXAM: CT OF THE UPPER RIGHT EXTREMITY WITHOUT CONTRAST TECHNIQUE: Multidetector CT imaging of the upper right extremity was performed according to the standard protocol. RADIATION DOSE REDUCTION: This exam was performed according to the departmental dose-optimization program which includes automated exposure control, adjustment of the mA and/or kV according to patient size and/or use of iterative reconstruction technique. COMPARISON:  Same day radiograph FINDINGS: Bones/Joint/Cartilage The humeral  head is not visualized, presumed postsurgical. Age indeterminate anterior dislocation of the humeral diaphysis. Age indeterminate fracture of the inferior glenoid (series 7/image 61). Advanced degenerative changes about the glenoid with moderate effusion. Ligaments Suboptimally assessed by CT. Muscles and Tendons Diffuse rotator cuff and deltoid atrophy. Soft tissues Interlobular septal thickening in the right lung suspicious for interstitial edema. Small right pleural effusion. IMPRESSION: 1. Age indeterminate anterior dislocation of the humeral diaphysis. Postsurgical absence of the humeral head. 2. Age indeterminate fracture of the inferior glenoid. 3. Advanced degenerative changes about the glenoid with moderate effusion. 4. Interlobular septal thickening in the right lung suspicious for interstitial edema. Small right pleural effusion. Electronically Signed   By: Norman Gatlin M.D.   On: 05/09/2024 19:39   CT Head Wo Contrast Result Date: 05/09/2024 CLINICAL DATA:  Head trauma, fall EXAM: CT HEAD WITHOUT CONTRAST CT CERVICAL SPINE WITHOUT CONTRAST TECHNIQUE: Multidetector CT imaging of the head and cervical spine was performed following the standard protocol without intravenous contrast. Multiplanar CT image reconstructions of the cervical spine were also generated. RADIATION DOSE REDUCTION: This exam was performed according to the departmental dose-optimization program which includes automated exposure control, adjustment of the mA and/or kV according to patient size and/or use of iterative reconstruction technique. COMPARISON:  08/20/2023 FINDINGS: CT HEAD FINDINGS Brain: No evidence of acute infarction, hemorrhage, hydrocephalus, extra-axial collection or mass lesion/mass effect. Periventricular white matter hypodensity. Vascular: No hyperdense vessel or unexpected calcification. Skull: Normal. Negative for fracture or focal lesion. Sinuses/Orbits: No acute finding. Other: None. CT CERVICAL SPINE  FINDINGS Alignment: Degenerative straightening of the normal cervical lordosis. Skull base and vertebrae: No acute fracture. No primary bone lesion or focal pathologic process. Soft tissues and spinal canal: No prevertebral fluid or swelling. No visible canal hematoma. Disc levels: Moderate to severe multilevel cervical disc degenerative disease from C3-C7. Upper chest: Negative. Other: None. IMPRESSION: 1. No acute intracranial pathology. Small-vessel white matter disease. 2. No fracture or static subluxation of the cervical spine. 3. Moderate to severe multilevel cervical disc degenerative disease from C3-C7. Electronically Signed   By: Marolyn JONETTA Jaksch M.D.   On: 05/09/2024 19:08   CT Cervical Spine Wo Contrast Result Date: 05/09/2024 CLINICAL DATA:  Head trauma, fall EXAM: CT HEAD WITHOUT CONTRAST CT CERVICAL SPINE WITHOUT CONTRAST TECHNIQUE: Multidetector CT imaging of the head and cervical spine was performed following the standard protocol without intravenous contrast. Multiplanar CT image reconstructions of the cervical spine were also generated. RADIATION DOSE REDUCTION: This exam was performed according to the departmental dose-optimization program which includes automated exposure control, adjustment of the mA and/or kV according to patient size and/or use of iterative reconstruction technique. COMPARISON:  08/20/2023 FINDINGS: CT HEAD FINDINGS Brain: No evidence of acute infarction, hemorrhage, hydrocephalus, extra-axial collection or mass lesion/mass effect. Periventricular white matter hypodensity. Vascular: No hyperdense vessel or unexpected calcification. Skull: Normal. Negative for fracture or focal lesion. Sinuses/Orbits: No acute finding. Other: None.  CT CERVICAL SPINE FINDINGS Alignment: Degenerative straightening of the normal cervical lordosis. Skull base and vertebrae: No acute fracture. No primary bone lesion or focal pathologic process. Soft tissues and spinal canal: No prevertebral fluid or  swelling. No visible canal hematoma. Disc levels: Moderate to severe multilevel cervical disc degenerative disease from C3-C7. Upper chest: Negative. Other: None. IMPRESSION: 1. No acute intracranial pathology. Small-vessel white matter disease. 2. No fracture or static subluxation of the cervical spine. 3. Moderate to severe multilevel cervical disc degenerative disease from C3-C7. Electronically Signed   By: Marolyn JONETTA Jaksch M.D.   On: 05/09/2024 19:08   DG Knee Complete 4 Views Left Result Date: 05/09/2024 CLINICAL DATA:  Knee pain.  Fall. EXAM: LEFT KNEE - COMPLETE 4+ VIEW COMPARISON:  01/24/2024 FINDINGS: No acute fracture or dislocation. Chronic advanced tricompartmental osteoarthritis with joint space narrowing, spurring and articular irregularity. Chronic ununited patellar fracture. No significant joint effusion. Generalized soft tissue edema. IMPRESSION: 1. No acute fracture or dislocation. 2. Chronic advanced tricompartmental osteoarthritis. 3. Chronic ununited patellar fracture. Electronically Signed   By: Andrea Gasman M.D.   On: 05/09/2024 18:39   DG Knee Complete 4 Views Right Result Date: 05/09/2024 CLINICAL DATA:  Knee pain.  Fall. EXAM: RIGHT KNEE - COMPLETE 4+ VIEW COMPARISON:  08/20/2023 FINDINGS: No acute fracture or dislocation. Tricompartmental osteoarthritis with joint space narrowing, spurring and articular irregularity. No significant joint effusion. No erosive change. Generalized soft tissue edema. IMPRESSION: 1. No acute fracture or dislocation of the right knee. 2. Advanced tricompartmental osteoarthritis. Electronically Signed   By: Andrea Gasman M.D.   On: 05/09/2024 18:38   DG Shoulder Right Result Date: 05/09/2024 CLINICAL DATA:  Pain after fall. EXAM: RIGHT SHOULDER - 2+ VIEW COMPARISON:  Shoulder radiograph 07/26/2021. Previous chest radiographs reviewed. FINDINGS: Chronic deformity of the right proximal humerus with absence of the humeral head, possibly postsurgical. There  is no evidence of acute fracture. Chronic widening of the acromioclavicular joint. IMPRESSION: 1. No acute fracture of the right shoulder. 2. Chronic deformity of the right proximal humerus with absence of the humeral head, possibly postsurgical. Electronically Signed   By: Andrea Gasman M.D.   On: 05/09/2024 18:35   DG Pelvis Portable Result Date: 05/09/2024 CLINICAL DATA:  Pain after fall. EXAM: PORTABLE PELVIS 1-2 VIEWS COMPARISON:  08/20/2023 FINDINGS: The bones are subjectively under mineralized. The cortical margins of the bony pelvis are intact. No fracture. Pubic symphysis and sacroiliac joints are congruent. Both femoral heads are well-seated in the respective acetabula. IMPRESSION: No pelvic fracture. Electronically Signed   By: Andrea Gasman M.D.   On: 05/09/2024 18:34   DG Chest Portable 1 View Result Date: 05/09/2024 CLINICAL DATA:  Pain after fall. EXAM: PORTABLE CHEST 1 VIEW COMPARISON:  11/24/2023 FINDINGS: Left-sided pacemaker remains in place. Cardiomegaly is stable. Mediastinal contours are unchanged. Diffuse peribronchial and interstitial thickening suspicious for pulmonary edema. No pneumothorax. No significant pleural effusion. Chronic right proximal humeral deformity. Chronic left shoulder arthropathy. On limited assessment, no acute osseous findings. IMPRESSION: Cardiomegaly with diffuse peribronchial and interstitial thickening suspicious for pulmonary edema. Electronically Signed   By: Andrea Gasman M.D.   On: 05/09/2024 18:26     Scheduled Meds:  allopurinol   50 mg Oral Daily   apixaban   5 mg Oral BID   carvedilol   12.5 mg Oral BID WC   furosemide   40 mg Intravenous Q12H   Gerhardt's butt cream   Topical BID   lactulose   10 g Oral TID  levothyroxine   50 mcg Oral QAC breakfast   magnesium  oxide  400 mg Oral BID   pantoprazole   40 mg Oral Daily   rifaximin   550 mg Oral BID   sodium chloride  flush  3 mL Intravenous Q12H   spironolactone   25 mg Oral Daily    Continuous Infusions:   LOS: 1 day    Time spent:    Sigurd Pac, MD [facility]   05/11/2024, 11:01 AM

## 2024-05-11 NOTE — Plan of Care (Signed)

## 2024-05-11 NOTE — TOC CM/SW Note (Signed)
 Pt recommended for SNF. CSW spoke with pt son Jacquelin Hooks [phone] son accepting of rec. PASRR obtained and FL2 completed. SNF referrals faxed out, awaiting bed offers.

## 2024-05-11 NOTE — [place] FL2 (Signed)
 [place]  MEDICAID FL2 LEVEL OF CARE FORM     IDENTIFICATION  Patient Name: Katelyn Jackson Birthdate: [birth year] Sex: female Admission Date (Current Location): 05/09/2024  [facility] and [place] Number:  Producer, television/film/video and Address:  [facility], [address] [place], [place] [address]      Provider Number: [id number]  Attending Physician Name and Address:  Fairy Frames, MD  Relative Name and Phone Number:  Jacquelin Hooks (Son)  [phone]    Current Level of Care: Hospital Recommended Level of Care: Skilled Nursing Facility Prior Approval Number:    Date Approved/Denied:   PASRR Number: [id number]  Discharge Plan: SNF    Current Diagnoses: Patient Active Problem List   Diagnosis Date Noted   CHF (congestive heart failure) (HCC) 05/10/2024   Acute on chronic heart failure with mildly reduced ejection fraction (HFmrEF, 41-49%) (HCC) 05/09/2024   Permanent atrial fibrillation (HCC) 05/09/2024   Thrombocytopenia (HCC) 05/09/2024   Cor pulmonale (chronic) (HCC) 12/01/2023   Hypo-osmolality and hyponatremia 12/01/2023   Chest pain, unspecified 11/30/2023   Hypertensive heart and chronic kidney disease with heart failure and stage 1 through stage 4 chronic kidney disease, or unspecified chronic kidney disease (HCC) 11/30/2023   Other specified cough 11/30/2023   Iron  deficiency anemia 11/30/2023   Paroxysmal atrial fibrillation (HCC) 11/29/2023   Acute renal failure superimposed on stage 3b chronic kidney disease (HCC) 11/24/2023   Acute on chronic systolic CHF (congestive heart failure) (HCC) 11/24/2023   RSV (respiratory syncytial virus pneumonia) 11/24/2023   Acute respiratory failure with hypoxia (HCC) 11/24/2023   Statin intolerance 11/02/2023   Cerebral infarction, unspecified (HCC) 08/28/2023   Estrogen receptor positive status (ER+) 08/28/2023   Other hypotension 08/28/2023   Myalgia, unspecified site 08/28/2023   Unspecified  skin changes 08/28/2023   Weakness 08/28/2023   Stroke-like symptoms 08/20/2023   Closed fracture of left clavicle 06/21/2023   Endometrial hyperplasia, unspecified 06/15/2023   Fecal impaction (HCC) 06/15/2023   Ventricular premature depolarization 06/15/2023   Dehydration 06/10/2023   Constipation 06/09/2023   Foley catheter in place prior to arrival 06/09/2023   Increased endometrial stripe thickness 06/09/2023   Other chronic allergic conjunctivitis 06/09/2023   Retention of urine, unspecified 06/07/2023   Pressure ulcer of sacral region, stage 3 (HCC) 06/01/2023   Pain, unspecified 05/30/2023   Encounter for other orthopedic aftercare 05/24/2023   History of falling 05/24/2023   Unspecified injury of head, subsequent encounter 05/24/2023   Closed patellar sleeve fracture of left knee 05/20/2023   Fall at home, initial encounter 05/18/2023   Patella fracture 05/18/2023   Clavicle fracture 05/18/2023   Abnormal urinalysis 05/18/2023   Chronic kidney disease, stage 3b (HCC) 05/18/2023   History of breast cancer 05/18/2023   Intertrigo 04/17/2023   Skin lesion 03/08/2023   Chronic systolic CHF (congestive heart failure) (HCC) 12/16/2022   Callus 11/04/2022   Cardiac pacemaker in situ 12/10/2021   Morbid obesity with BMI of 45.0-49.9, adult (HCC) 12/10/2021   Breast lesion 12/07/2021   Dry skin 09/22/2021   Colonization with VRE (vancomycin -resistant enterococcus) 09/22/2021   Osteoarthritis of right knee 08/19/2021   Herpes labialis 08/19/2021   Liver cirrhosis (HCC) 07/22/2021   Abscess    Prosthetic joint infection (HCC)    Abscess of right shoulder 07/21/2021   History of pacemaker 07/21/2021   Pressure injury of skin 07/03/2021   Hypothyroidism    Cellulitis    Postoperative anemia due to acute blood loss 03/19/2021  Dyspnea 03/19/2021   Symptomatic anemia 03/19/2021   Status post right shoulder hemiarthroplasty 03/17/2021   Pacemaker reprogramming/check  01/06/2021   Bradycardia 01/05/2021   CAP (community acquired pneumonia) 01/05/2021   CKD stage 3b, GFR 30-44 ml/min (HCC) 01/05/2021   Bilateral pain of leg and foot 10/08/2020   Abscess of left thumb 06/17/2020   Cellulitis and abscess of left leg 06/17/2020   Herpes zoster 02/18/2020   GERD (gastroesophageal reflux disease) 11/25/2019   Wheezing 10/26/2017   Sciatica of left side 08/29/2017   Long term (current) use of anticoagulants 08/02/2017   Obesity, class 2 07/11/2017   Hyperglycemia 07/11/2017   Low back pain 06/19/2017   Hypokalemia 06/19/2017   Anemia of chronic disease 02/16/2017   Malignant neoplasm of upper-outer quadrant of left breast in female, estrogen receptor positive (HCC) 02/10/2017   Essential hypertension 04/05/2016   Dyslipidemia 04/05/2016   Chronic atrial fibrillation (HCC) 04/05/2016   Pulmonary hypertension (HCC) 04/05/2016   Lymphedema 04/05/2016    Orientation RESPIRATION BLADDER Height & Weight     Self, Time, Situation, Place  Normal Continent Weight: 178 lb 2.1 oz (80.8 kg) Height:  5' (152.4 cm)  BEHAVIORAL SYMPTOMS/MOOD NEUROLOGICAL BOWEL NUTRITION STATUS      Continent Diet (heart healthy)  AMBULATORY STATUS COMMUNICATION OF NEEDS Skin   Limited Assist Verbally Normal                       Personal Care Assistance Level of Assistance  Bathing, Dressing, Feeding Bathing Assistance: Limited assistance Feeding assistance: Independent Dressing Assistance: Limited assistance     Functional Limitations Info  Speech, Hearing, Sight Sight Info: Adequate Hearing Info: Impaired Speech Info: Adequate    SPECIAL CARE FACTORS FREQUENCY  PT (By licensed PT), OT (By licensed OT)     PT Frequency: 5x/wk OT Frequency: 5x/wk            Contractures Contractures Info: Not present    Additional Factors Info  Code Status, Allergies Code Status Info: DNR Allergies Info: Chlorhexidine  Gluconate  Hibiclens  (Chlorhexidine  Gluconate)   Mevacor  (Lovastatin )  Penicillin G  Penicillins           Current Medications (05/11/2024):  This is the current hospital active medication list Current Facility-Administered Medications  Medication Dose Route Frequency Provider Last Rate Last Admin   acetaminophen  (TYLENOL ) tablet 650 mg  650 mg Oral Q6H PRN Patel, Vishal R, MD   650 mg at 05/11/24 9095   Or   acetaminophen  (TYLENOL ) suppository 650 mg  650 mg Rectal Q6H PRN Patel, Vishal R, MD       allopurinol  (ZYLOPRIM ) tablet 50 mg  50 mg Oral Daily Patel, Vishal R, MD   50 mg at 05/11/24 0854   apixaban  (ELIQUIS ) tablet 5 mg  5 mg Oral BID Patel, Vishal R, MD   5 mg at 05/11/24 0853   carvedilol  (COREG ) tablet 12.5 mg  12.5 mg Oral BID WC Patel, Vishal R, MD   12.5 mg at 05/11/24 0854   furosemide  (LASIX ) injection 40 mg  40 mg Intravenous Q12H Joseph, Preetha, MD   40 mg at 05/10/24 2305   Gerhardt's butt cream   Topical BID Fairy Frames, MD   Given at 05/11/24 0901   lactulose  (CHRONULAC ) 10 GM/15ML solution 10 g  10 g Oral TID Patel, Vishal R, MD   10 g at 05/11/24 9146   levothyroxine  (SYNTHROID ) tablet 50 mcg  50 mcg Oral QAC breakfast Patel, Vishal R,  MD   50 mcg at 05/11/24 0506   magnesium  oxide (MAG-OX) tablet 400 mg  400 mg Oral BID Patel, Vishal R, MD   400 mg at 05/11/24 9145   ondansetron  (ZOFRAN ) tablet 4 mg  4 mg Oral Q6H PRN Patel, Vishal R, MD       Or   ondansetron  (ZOFRAN ) injection 4 mg  4 mg Intravenous Q6H PRN Patel, Vishal R, MD       pantoprazole  (PROTONIX ) EC tablet 40 mg  40 mg Oral Daily Patel, Vishal R, MD   40 mg at 05/11/24 9146   rifaximin  (XIFAXAN ) tablet 550 mg  550 mg Oral BID Patel, Vishal R, MD   550 mg at 05/11/24 0901   senna-docusate (Senokot-S) tablet 1 tablet  1 tablet Oral QHS PRN Patel, Vishal R, MD       sodium chloride  flush (NS) 0.9 % injection 3 mL  3 mL Intravenous Q12H Patel, Vishal R, MD   3 mL at 05/11/24 0901   spironolactone  (ALDACTONE ) tablet 25 mg  25 mg Oral Daily Patel, Vishal  R, MD   25 mg at 05/11/24 0910   traMADol  (ULTRAM ) tablet 50 mg  50 mg Oral Q6H PRN Patel, Vishal R, MD   50 mg at 05/11/24 0510     Discharge Medications: Please see discharge summary for a list of discharge medications.  Relevant Imaging Results:  Relevant Lab Results:   Additional Information SSN [id number]  Sheri ONEIDA Sharps, LCSW

## 2024-05-11 NOTE — Evaluation (Signed)
 Occupational Therapy Evaluation Patient Details Name: Katelyn Jackson MRN: [id number] DOB: 1945-11-23 Today's Date: 05/11/2024   History of Present Illness   Pt is a 78 y.o. female presenting 05/09/24 from home with unwitnessed fall, c/o L knee and R shoulder pain. Xrays neg, though visualization difficult L shoulder. PMH: afib on Eliquis  s/p PPM, hypothyroidism, pacemaker, CKD IIIb, NASH cirrhosis, HTN, HLD, CHF, breast cancer, chronic lymphedema, gout, anemia, lymphedema, multiple fractures from multiple falls.     Clinical Impressions Pt admitted for above, PTA pt lived with her son and DIL but reports being home alone at periods of the day. Pt also notes a hx of falls and needs family to assist with bathing/dressing and iADLs. Pt currently presenting with generalized weakness, impaired balance, and impaired functional use of her Ues. She needs min A to ambulate very short distances with RW and has poor activity tolerance, also needs total A to min A for ADL assist. Pt would benefit from continued acute skilled OT services to address listed deficits and help pt transition to next level of care. Patient will benefit from continued inpatient follow up therapy, <3 hours/day      If plan is discharge home, recommend the following:   A little help with walking and/or transfers;A lot of help with bathing/dressing/bathroom;Assistance with cooking/housework;Assist for transportation     Functional Status Assessment   Patient has had a recent decline in their functional status and demonstrates the ability to make significant improvements in function in a reasonable and predictable amount of time.     Equipment Recommendations   BSC/3in1 (could not clarify if pt had already)     Recommendations for Other Services         Precautions/Restrictions   Precautions Precautions: Fall Recall of Precautions/Restrictions: Impaired Precaution/Restrictions Comments: has had many  falls Restrictions Weight Bearing Restrictions Per Provider Order: No     Mobility Bed Mobility Overal bed mobility: Needs Assistance Bed Mobility: Supine to Sit, Sit to Supine     Supine to sit: Mod assist Sit to supine: Mod assist   General bed mobility comments: mod A to assist wtih BLEs, mod A to raise trunk as well.    Transfers Overall transfer level: Needs assistance Equipment used: Rolling walker (2 wheels) Transfers: Sit to/from Stand Sit to Stand: Min assist           General transfer comment: STSx2 from EOB min A , min A for steps near bedside. Pt RLE buckles during mobility.      Balance Overall balance assessment: Needs assistance Sitting-balance support: Feet supported, Single extremity supported Sitting balance-Leahy Scale: Fair     Standing balance support: Bilateral upper extremity supported, During functional activity, Reliant on assistive device for balance Standing balance-Leahy Scale: Poor Standing balance comment: heavily reliant on UE support                           ADL either performed or assessed with clinical judgement   ADL Overall ADL's : Needs assistance/impaired Eating/Feeding: Set up;Bed level Eating/Feeding Details (indicate cue type and reason): using LUE Grooming: Sitting;Minimal assistance   Upper Body Bathing: Moderate assistance;Sitting   Lower Body Bathing: Maximal assistance;Sitting/lateral leans   Upper Body Dressing : Sitting;Maximal assistance   Lower Body Dressing: Total assistance;Bed level   Toilet Transfer: Minimal assistance;BSC/3in1;Rolling walker (2 wheels) Toilet Transfer Details (indicate cue type and reason): simulated in room Toileting- Clothing Manipulation and Hygiene: Maximal assistance;Sit to/from stand  Functional mobility during ADLs: Minimal assistance;Rolling walker (2 wheels) General ADL Comments: Pt with low activity tolerance, can take maybe 10-15 steps max     Vision    Vision Assessment?: No apparent visual deficits     Perception         Praxis         Pertinent Vitals/Pain Pain Assessment Pain Assessment: Faces Faces Pain Scale: Hurts little more Pain Location: L shoulder Pain Descriptors / Indicators: Aching, Sore Pain Intervention(s): Limited activity within patient's tolerance, Monitored during session     Extremity/Trunk Assessment Upper Extremity Assessment Upper Extremity Assessment: Generalized weakness;LUE deficits/detail;RUE deficits/detail RUE Deficits / Details: old R shoulder injury, very limited ROM, pt uses L hand to assist R. No shoulder AROM. limited use of biceps. RUE Sensation: WNL RUE Coordination: decreased gross motor;decreased fine motor LUE Deficits / Details: ROM WFL for elbows, shoulder flexion not fully assessed at least 70 deg AROM. Generally weak UE. LUE Sensation: WNL LUE Coordination: WNL   Lower Extremity Assessment RLE Deficits / Details: hip flex 2/5, knee ext 3/5, chronic lymphedema RLE Sensation: decreased proprioception RLE Coordination: decreased gross motor LLE Deficits / Details: hip flex 2-/5, knee ext 3-/5, pt with knee pain with mvmt of LLE. chronic lymphedema LLE Sensation: decreased proprioception LLE Coordination: decreased gross motor   Cervical / Trunk Assessment Cervical / Trunk Assessment: Other exceptions Cervical / Trunk Exceptions: morbid obesity   Communication Communication Communication: No apparent difficulties Factors Affecting Communication: Non - English speaking, interpreter not available (but pt speaks basic [place])   Cognition Arousal: Alert Behavior During Therapy: WFL for tasks assessed/performed Cognition: No apparent impairments                               Following commands: Intact       Cueing  General Comments   Cueing Techniques: Verbal cues  VSS RA.   Exercises     Shoulder Instructions      Home Living Family/patient expects  to be discharged to:: Private residence Living Arrangements: Children Available Help at Discharge: Family;Available PRN/intermittently Type of Home: House Home Access: Ramped entrance     Home Layout: Multi-level;Able to live on main level with bedroom/bathroom     Bathroom Shower/Tub: Chief Strategy Officer: Standard Bathroom Accessibility: No   Home Equipment: Tub bench;Wheelchair - Forensic psychologist (2 wheels);Rollator (4 wheels)   Additional Comments: multiple falls and rehab admissions      Prior Functioning/Environment Prior Level of Function : Needs assist             Mobility Comments: Uses wheelchair mostly. Walking very short distances with RW, Doorframe of Bathroom to shower/toilet with assist. ADLs Comments: States her son and DIL assist with bathing and dressing.  DIL completes meals and iADL    OT Problem List: Impaired balance (sitting and/or standing);Pain;Decreased activity tolerance;Decreased range of motion;Impaired UE functional use;Decreased strength;Obesity;Decreased coordination   OT Treatment/Interventions: Self-care/ADL training;Patient/family education;Therapeutic exercise;Balance training;Therapeutic activities;DME and/or AE instruction      OT Goals(Current goals can be found in the care plan section)   Acute Rehab OT Goals Patient Stated Goal: To get strong enough to return home OT Goal Formulation: With patient Time For Goal Achievement: 05/25/24 Potential to Achieve Goals: Fair ADL Goals Pt Will Perform Grooming: sitting;with set-up;with adaptive equipment Pt Will Perform Upper Body Bathing: sitting;with set-up;with adaptive equipment Pt Will Perform Lower Body Bathing: with min assist;with  adaptive equipment;sitting/lateral leans Pt Will Transfer to Toilet: with contact guard assist;bedside commode;stand pivot transfer   OT Frequency:  Min 2X/week    Co-evaluation              AM-PAC OT 6 Clicks Daily  Activity     Outcome Measure Help from another person eating meals?: A Little Help from another person taking care of personal grooming?: A Little Help from another person toileting, which includes using toliet, bedpan, or urinal?: A Lot Help from another person bathing (including washing, rinsing, drying)?: A Lot Help from another person to put on and taking off regular upper body clothing?: A Lot Help from another person to put on and taking off regular lower body clothing?: Total 6 Click Score: 13   End of Session Equipment Utilized During Treatment: Gait belt;Rolling walker (2 wheels) Nurse Communication: Mobility status  Activity Tolerance: Patient tolerated treatment well Patient left: in bed;with call bell/phone within reach;with bed alarm set  OT Visit Diagnosis: Unsteadiness on feet (R26.81);Other abnormalities of gait and mobility (R26.89);History of falling (Z91.81);Muscle weakness (generalized) (M62.81);Pain Pain - Right/Left: Left Pain - part of body: Shoulder                Time: 1354-1420 OT Time Calculation (min): 26 min Charges:  OT General Charges $OT Visit: 1 Visit OT Evaluation $OT Eval Moderate Complexity: 1 Mod OT Treatments $Therapeutic Activity: 8-22 mins  05/11/2024  AB, OTR/L  Acute Rehabilitation Services  Office: [phone]   Curtistine JONETTA Das 05/11/2024, 3:23 PM

## 2024-05-12 DIAGNOSIS — I5023 Acute on chronic systolic (congestive) heart failure: Secondary | ICD-10-CM | POA: Diagnosis not present

## 2024-05-12 LAB — BASIC METABOLIC PANEL WITH GFR
Anion gap: 9 (ref 5–15)
BUN: 26 mg/dL — ABNORMAL HIGH (ref 8–23)
CO2: 27 mmol/L (ref 22–32)
Calcium: 9.5 mg/dL (ref 8.9–10.3)
Chloride: 104 mmol/L (ref 98–111)
Creatinine, Ser: 1.76 mg/dL — ABNORMAL HIGH (ref 0.44–1.00)
GFR, Estimated: 29 mL/min — ABNORMAL LOW (ref >60)
Glucose, Bld: 100 mg/dL — ABNORMAL HIGH (ref 70–99)
Potassium: 3.8 mmol/L (ref 3.5–5.1)
Sodium: 140 mmol/L (ref 135–145)

## 2024-05-12 MED ORDER — TORSEMIDE 20 MG PO TABS
40.0000 mg | ORAL_TABLET | Freq: Every day | ORAL | Status: DC
  Administered 2024-05-12: 40 mg via ORAL
  Filled 2024-05-12: qty 2

## 2024-05-12 NOTE — Plan of Care (Signed)
   Problem: Education: Goal: Knowledge of General Education information will improve Description Including pain rating scale, medication(s)/side effects and non-pharmacologic comfort measures Outcome: Progressing

## 2024-05-12 NOTE — Progress Notes (Addendum)
 PROGRESS NOTE    Katelyn Jackson  FMW:[id number] DOB: 1946/01/07 DOA: 05/09/2024 PCP: Garald [name], MD   78 chronically ill with permanent atrial fibrillation on Eliquis , bradycardia s/p PPM, chronic HFmrEF, CKD stage IIIb, hepatic cirrhosis, anemia of chronic disease, HTN, HLD, hypothyroidism, chronic lymphedema, gout who presented to the ED for evaluation after a fall at home. normally transports using a wheelchair but sometimes will use walker.  Reports a fall on 7/3 after slipped out of her wheelchair, unable to get up on her own and laid on the floor for 7 hours, chronic back and shoulder pain. -In addition also recent increased dyspnea, cough, chronic lymphedema, compliant with torsemide  -In the ED vital signs stable, CK1 1 4, creatinine 1.6, BNP 480, chest x-ray with cardiomegaly and pulm mild pulmonary edema -Extensive traumatic injury imaging was negative for significant acute injury.  Notable findings on CT right shoulder of age-indeterminate anterior dislocation of the right humeral diaphysis, postsurgical absence of the humeral head, age-indeterminate fracture of the inferior glenoid.   Subjective: - Feels better overall, breathing is improving, left shoulder still with some pain and limited range of motion  Assessment and Plan:  Acute on chronic HFmrEF: Patient presenting with recently increased dyspnea, chronic lymphedema, pulmonary edema on imaging.   - TTE 08/23/2023 showed EF 40-45%, global hypokinesis of LV, severe MR, RV dysfunction. - Improving with diuresis, 6.9 L negative, continue IV Lasix  1 more day, switch to oral torsemide  today - Continue Coreg  12.5 mg twice daily - Continue spironolactone  25 mg daily, poor candidate for SGLT2i - Strict I/O's and daily weights -Increase activity, PT OT eval> SNF recommended, patient declines this   Permanent atrial fibrillation S/p PPM: - Continue Coreg  and Eliquis .   Fall at home: Left shoulder recent  fracture Mechanical fall out of wheelchair to floor with prolonged downtime.  CK is not elevated.  Extensive imaging negative for acute traumatic injury.  Age indeterminant findings involving right humerus, site of prior shoulder hemiarthroplasty in 5/22, does not have any new symptoms on the right side -Reports left shoulder pain, x-ray unremarkable but CT recommended, CT yesterday a.m. with nonacute appearing fracture, malunion etc. all appear to be chronic findings, will discuss with orthopedics -Left knee x-ray with advanced osteoarthritis - PT/OT eval, fall precautions -Patient declined short-term rehab, will set up home health services   CKD stage IIIb: Renal function stable.     Liver cirrhosis: Chronic and stable without evidence of hepatic encephalopathy.  - Continue spironolactone , Lasix , rifaximin , lactulose    Hypertension: Continue Coreg , spironolactone .   Hypothyroidism: Continue Synthroid .   Thrombocytopenia: Mild in setting of hepatic cirrhosis without obvious bleeding.   Anemia of chronic disease: Hemoglobin stable at 10.6.   Gout: Continue allopurinol .   DVT prophylaxis:  apixaban  (ELIQUIS ) tablet 5 mg   Code Status:   DNR  family Communication: None present, called and updated son Disposition Plan: Home tomorrow if stable  Consultants:    Procedures:   Antimicrobials:    Objective: Vitals:   05/12/24 0005 05/12/24 0414 05/12/24 0500 05/12/24 0757  BP: 136/81 135/72  122/70  Pulse: 70 78  70  Resp: 14 15  14   Temp: 98.4 F (36.9 C) 98.4 F (36.9 C)  98.8 F (37.1 C)  TempSrc: Oral Oral  Oral  SpO2: 97% 95%  97%  Weight:   78.3 kg   Height:        Intake/Output Summary (Last 24 hours) at 05/12/2024 1001 Last data filed at 05/12/2024 0900  Gross per 24 hour  Intake 600 ml  Output 2850 ml  Net -2250 ml   Filed Weights   05/10/24 1221 05/11/24 0440 05/12/24 0500  Weight: 83.4 kg 80.8 kg 78.3 kg    Examination:  Gen: Awake, Alert,  Oriented X 3,  HEENT: Neck obese unable to assess JVD Lungs: Decreased breath sounds at the bases CVS: S1S2/RRR Abd: soft, Non tender, non distended, BS present Extremities: 1+ edema, chronic lymphedema changes Right shoulder with deformity but no tenderness with range of motion, left shoulder with mild tenderness and discomfort noted with range of motion Skin: no new rashes on exposed skin     Data Reviewed:   CBC: Recent Labs  Lab 05/09/24 1823 05/10/24 0513  WBC 4.6 4.7  NEUTROABS 3.1  --   HGB 10.6* 10.7*  HCT 34.9* 34.8*  MCV 95.9 94.3  PLT 129* 131*   Basic Metabolic Panel: Recent Labs  Lab 05/09/24 1823 05/10/24 0513 05/11/24 0220 05/12/24 0328  NA 138 140 140 140  K 4.6 4.1 3.9 3.8  CL 107 106 107 104  CO2 20* 19* 23 27  GLUCOSE 93 88 102* 100*  BUN 26* 26* 27* 26*  CREATININE 1.65* 1.61* 1.76* 1.76*  CALCIUM 10.2 10.2 9.8 9.5  MG  --  2.2  --   --    GFR: Estimated Creatinine Clearance: 24.4 mL/min (A) (by C-G formula based on SCr of 1.76 mg/dL (H)). Liver Function Tests: Recent Labs  Lab 05/09/24 1823  AST 19  ALT 16  ALKPHOS 139*  BILITOT 1.4*  PROT 7.1  ALBUMIN 3.8   No results for input(s): LIPASE, AMYLASE in the last 168 hours. No results for input(s): AMMONIA in the last 168 hours. Coagulation Profile: No results for input(s): INR, PROTIME in the last 168 hours. Cardiac Enzymes: Recent Labs  Lab 05/09/24 1823  CKTOTAL 114   BNP (last 3 results) No results for input(s): PROBNP in the last 8760 hours. HbA1C: No results for input(s): HGBA1C in the last 72 hours. CBG: No results for input(s): GLUCAP in the last 168 hours. Lipid Profile: No results for input(s): CHOL, HDL, LDLCALC, TRIG, CHOLHDL, LDLDIRECT in the last 72 hours. Thyroid  Function Tests: No results for input(s): TSH, T4TOTAL, FREET4, T3FREE, THYROIDAB in the last 72 hours. Anemia Panel: No results for input(s): VITAMINB12,  FOLATE, FERRITIN, TIBC, IRON , RETICCTPCT in the last 72 hours. Urine analysis:    Component Value Date/Time   COLORURINE YELLOW 08/20/2023 2246   APPEARANCEUR TURBID (A) 08/20/2023 2246   LABSPEC 1.010 08/20/2023 2246   PHURINE 6.0 08/20/2023 2246   GLUCOSEU NEGATIVE 08/20/2023 2246   GLUCOSEU NEGATIVE 07/11/2017 1108   HGBUR SMALL (A) 08/20/2023 2246   BILIRUBINUR NEGATIVE 08/20/2023 2246   KETONESUR NEGATIVE 08/20/2023 2246   PROTEINUR 30 (A) 08/20/2023 2246   UROBILINOGEN 0.2 07/11/2017 1108   NITRITE NEGATIVE 08/20/2023 2246   LEUKOCYTESUR LARGE (A) 08/20/2023 2246   Sepsis Labs: @LABRCNTIP (procalcitonin:4,lacticidven:4)  )No results found for this or any previous visit (from the past 240 hours).   Radiology Studies: CT SHOULDER LEFT WO CONTRAST Result Date: 05/11/2024 CLINICAL DATA:  Shoulder trauma, instability or dislocation suspected, xray done Pain EXAM: CT OF THE UPPER LEFT EXTREMITY WITHOUT CONTRAST TECHNIQUE: Multidetector CT imaging of the left shoulder was performed according to the standard protocol. RADIATION DOSE REDUCTION: This exam was performed according to the departmental dose-optimization program which includes automated exposure control, adjustment of the mA and/or kV according to patient size and/or use  of iterative reconstruction technique. COMPARISON:  Radiographs 08/20/2023 and 05/11/2023. CT of the right shoulder 05/09/2024. Chest CT 01/05/2021. FINDINGS: Bones/Joint/Cartilage No evidence of acute fracture or dislocation. The humeral head is located. There is a nonacute appearing fracture involving the base of the left scapular acromion. This has sclerotic margins, suspicious for nonunion. There is additional deformity more distally within the acromion with up to 1.5 cm of displacement (image 23/3), possibly postsurgical. The scapular body is intact. There is advanced glenohumeral arthropathy with marked joint space narrowing and osteophytes of the  humeral head and glenoid. Associated small shoulder joint effusion with small intra-articular loose bodies. There are moderate acromioclavicular degenerative changes with undersurface erosion of the distal clavicle and acromion. As above, there is deformity of the tip of the acromion with displacement, possibly postsurgical. The osseous and articular findings within the left shoulder are new compared with the chest CT from 01/05/2021. Ligaments Suboptimally assessed by CT. Muscles and Tendons At least moderate supraspinatus muscular atrophy. Otherwise mild generalized rotator cuff muscular atrophy. Marked narrowing of the subacromial space anteriorly implying a chronic supraspinatus tendon tear. Soft tissues No focal periarticular fluid collection, foreign body or soft tissue emphysema. Left subclavian pacemaker noted with chronic fibrosis anteriorly in the left upper lobe, likely related to prior radiation therapy. There are surgical clips in the left axilla. IMPRESSION: 1. No evidence of acute fracture or dislocation. 2. Nonacute appearing fracture involving the base of the left scapular acromion with sclerotic margins, suspicious for nonunion. Additional deformity of the tip of the acromion with significant displacement, possibly postsurgical. Given the rapid progression from chest CT of 3 years ago and the contralateral right shoulder findings, consider neuropathic arthropathy. 3. Advanced glenohumeral arthropathy with small shoulder joint effusion and small intra-articular loose bodies. 4. Moderate acromioclavicular degenerative changes with undersurface erosion of the distal clavicle and acromion. 5. At least moderate supraspinatus muscular atrophy with marked narrowing of the subacromial space anteriorly implying a chronic supraspinatus tendon tear. Electronically Signed   By: Elsie Perone M.D.   On: 05/11/2024 11:21   DG Shoulder Left Result Date: 05/10/2024 CLINICAL DATA:  Unwitnessed fall 7 hours ago.   Shoulder pain. EXAM: LEFT SHOULDER - 2+ VIEW COMPARISON:  None Available. FINDINGS: No evidence for an acute fracture. Acromioclavicular joint not well demonstrated on the current study due to positioning. Degenerative changes are noted in the glenohumeral joint. No evidence for shoulder dislocation. Apparent narrowing of the acromial humeral distance suggests chronic rotator cuff pathology. IMPRESSION: Study limited by osteopenia and positioning. While no gross fracture or dislocation evident, CT or MR imaging could be used to further evaluate as clinically warranted. Electronically Signed   By: Camellia Candle M.D.   On: 05/10/2024 12:08     Scheduled Meds:  allopurinol   50 mg Oral Daily   apixaban   5 mg Oral BID   carvedilol   12.5 mg Oral BID WC   furosemide   40 mg Intravenous Q12H   Gerhardt's butt cream   Topical BID   lactulose   10 g Oral TID   levothyroxine   50 mcg Oral QAC breakfast   magnesium  oxide  400 mg Oral BID   pantoprazole   40 mg Oral Daily   rifaximin   550 mg Oral BID   sodium chloride  flush  3 mL Intravenous Q12H   spironolactone   25 mg Oral Daily   Continuous Infusions:   LOS: 2 days    Time spent:    Sigurd Pac, MD [facility]  05/12/2024, 10:01 AM

## 2024-05-12 NOTE — Plan of Care (Signed)

## 2024-05-13 ENCOUNTER — Ambulatory Visit: Admitting: Internal Medicine

## 2024-05-13 DIAGNOSIS — I5023 Acute on chronic systolic (congestive) heart failure: Secondary | ICD-10-CM | POA: Diagnosis not present

## 2024-05-13 LAB — BASIC METABOLIC PANEL WITH GFR
Anion gap: 8 (ref 5–15)
BUN: 30 mg/dL — ABNORMAL HIGH (ref 8–23)
CO2: 30 mmol/L (ref 22–32)
Calcium: 9 mg/dL (ref 8.9–10.3)
Chloride: 102 mmol/L (ref 98–111)
Creatinine, Ser: 2.27 mg/dL — ABNORMAL HIGH (ref 0.44–1.00)
GFR, Estimated: 22 mL/min — ABNORMAL LOW (ref >60)
Glucose, Bld: 114 mg/dL — ABNORMAL HIGH (ref 70–99)
Potassium: 4 mmol/L (ref 3.5–5.1)
Sodium: 140 mmol/L (ref 135–145)

## 2024-05-13 MED ORDER — TORSEMIDE 20 MG PO TABS: 60.0000 mg | ORAL_TABLET | Freq: Every day | ORAL | Status: AC

## 2024-05-13 MED ORDER — SPIRONOLACTONE 25 MG PO TABS: 25.0000 mg | ORAL_TABLET | Freq: Every day | ORAL | Status: AC

## 2024-05-13 NOTE — Care Management Important Message (Signed)
 Important Message  Patient Details  Name: Katelyn Jackson MRN: [id number] Date of Birth: 09-Sep-1946   Important Message Given:  Yes - Medicare IM     Jon Cruel 05/13/2024, 12:24 PM

## 2024-05-13 NOTE — Progress Notes (Signed)
 Reviewed AVS, with patients son and he expressed understanding of medications, MD follow up reviewed.   Removed IV, Site clean, dry and intact.  Patient states all belongings brought to the hospital at time of admission are accounted for and packed to take home.  Pt transported to Discharge lounge to wait for transportation home.

## 2024-05-13 NOTE — Discharge Summary (Signed)
 Physician Discharge Summary  Katelyn Jackson DOB: 01/04/1946 DOA: 05/09/2024  PCP: Garald [name], MD  Admit date: 05/09/2024 Discharge date: 05/13/2024  Time spent: 45 minutes  Recommendations for Outpatient Follow-up:  PCP Dr. Garald in 1 week, please check BMP at follow-up Restart Aldactone  in 3 days, resume torsemide  tomorrow [facility] in 2 to 3 weeks Orthopedics Dr. Bonner Hair in 1-2 weeks  Discharge Diagnoses:  Principal Problem:   Acute on chronic heart failure with mildly reduced ejection fraction (HFmrEF, 41-49%) (HCC) Chronic left shoulder fracture   Fall at home, initial encounter   Chronic kidney disease, stage 3b (HCC)   Essential hypertension   Anemia of chronic disease   Liver cirrhosis (HCC)   Hypothyroidism   Cardiac pacemaker in situ   Permanent atrial fibrillation (HCC)   Thrombocytopenia (HCC)   CHF (congestive heart failure) (HCC)   Discharge Condition: Improved  Diet recommendation: Low-sodium, heart healthy  Filed Weights   05/11/24 0440 05/12/24 0500 05/13/24 0353  Weight: 80.8 kg 78.3 kg 77.4 kg    History of present illness:  78 chronically ill with permanent atrial fibrillation on Eliquis , bradycardia s/p PPM, chronic HFmrEF, CKD stage IIIb, hepatic cirrhosis, anemia of chronic disease, HTN, HLD, hypothyroidism, chronic lymphedema, gout who presented to the ED for evaluation after a fall at home. normally transports using a wheelchair but sometimes will use walker.  Reports a fall on 7/3 after slipped out of her wheelchair, unable to get up on her own and laid on the floor for 7 hours, chronic back and shoulder pain. -In addition also recent increased dyspnea, cough, chronic lymphedema, compliant with torsemide  -In the ED vital signs stable, CK1 1 4, creatinine 1.6, BNP 480, chest x-ray with cardiomegaly and pulm mild pulmonary edema -Extensive traumatic injury imaging was negative for significant acute injury.  Notable  findings on CT right shoulder of age-indeterminate anterior dislocation of the right humeral diaphysis, postsurgical absence of the humeral head, age-indeterminate fracture of the inferior glenoid.  Hospital Course:   Acute on chronic HFmrEF: Patient presenting with recently increased dyspnea, chronic lymphedema, pulmonary edema on imaging.   - TTE 08/23/2023 showed EF 40-45%, global hypokinesis of LV, severe MR, RV dysfunction. - Improving with diuresis, 8 L negative, switch to oral torsemide  yesterday, creatinine slightly higher, hold torsemide  for 1 more day and resume tomorrow, continue Coreg  -Also advised to hold Aldactone  for 2 to 3 days on account of AKI/CKD -Please check BMP in 1 week, if creatinine remains above 2 would discontinue Aldactone  altogether - PT OT eval> SNF recommended, patient declines this, set up with home health services at discharge -[facility]-up with [facility] and PCP in 1 week   Permanent atrial fibrillation S/p PPM: - Continue Coreg  and Eliquis .   Fall at home: Left shoulder recent fracture Mechanical fall out of wheelchair to floor with prolonged downtime.  CK is not elevated.  Extensive imaging negative for acute traumatic injury.  Age indeterminant findings involving right humerus, site of prior shoulder hemiarthroplasty in 5/22, does not have any new symptoms on the right side -Reports some left shoulder pain, x-ray unremarkable but CT recommended, CT noted nonacute appearing fracture, malunion etc. all appear to be chronic findings, on further discussion with patient and son they report that she fell and had shoulder pain 2 months ago, saw someone with orthopedics who recommended supportive care  -discussed with orthopedics, recommended follow-up with Dr. Hair -Left knee x-ray with advanced osteoarthritis - PT/OT eval, fall precautions -Patient  declined short-term rehab, will set up home health services   CKD stage IIIb: Renal function stable.      Liver cirrhosis: Chronic and stable without evidence of hepatic encephalopathy.  - Continue rifaximin  and lactulose  -See discussion above regarding torsemide  and Aldactone    Hypertension: Continue Coreg , spironolactone .   Hypothyroidism: Continue Synthroid .   Thrombocytopenia: Mild in setting of hepatic cirrhosis without obvious bleeding.   Anemia of chronic disease: Hemoglobin stable at 10.6.   Gout: Continue allopurinol .  Discharge Exam: Vitals:   05/13/24 0353 05/13/24 0735  BP: (!) 114/59 (!) 145/75  Pulse: 70 75  Resp:  18  Temp: 98.6 F (37 C) 97.7 F (36.5 C)  SpO2: 97% 95%   Gen: Awake, Alert, Oriented X 3,  HEENT: Neck obese unable to assess JVD Lungs: Decreased breath sounds at the bases CVS: S1S2/RRR Abd: soft, Non tender, non distended, BS present Extremities: 1+ edema, chronic lymphedema changes Right shoulder with deformity but no tenderness, has chronically limited range of motion on the right arm, left shoulder with mild discomfort noted with range of motion Skin: no new rashes on exposed skin     Discharge Instructions   Discharge Instructions     Diet - low sodium heart healthy   Complete by: As directed    Discharge wound care:   Complete by: As directed    routine   Face-to-face encounter (required for Medicare/Medicaid patients)   Complete by: As directed    I Lamar JAYSON Shan certify that this patient is under my care and that I, or a nurse practitioner or physician's assistant working with me, had a face-to-face encounter that meets the physician face-to-face encounter requirements with this patient on 05/09/2024. The encounter with the patient was in whole, or in part for the following medical condition(s) which is the primary reason for home health care (List medical condition): lymphedema and deconditioning   The encounter with the patient was in whole, or in part, for the following medical condition, which is the primary reason for  home health care: deconditioning   I certify that, based on my findings, the following services are medically necessary home health services: Physical therapy   Reason for Medically Necessary Home Health Services:  Therapy- Investment banker, operational, Patent examiner Therapy- Instruction on use of Assistive Device for Ambulation on all Surfaces Therapy- Home Adaptation to Facilitate Safety Therapy- Therapeutic Exercises to Increase Strength and Endurance     My clinical findings support the need for the above services: Unsafe ambulation due to balance issues   Further, I certify that my clinical findings support that this patient is homebound due to: Unsafe ambulation due to balance issues   Home Health   Complete by: As directed    To provide the following care/treatments: PT   Increase activity slowly   Complete by: As directed       Allergies as of 05/13/2024       Reactions   Chlorhexidine  Gluconate    Hibiclens  [chlorhexidine  Gluconate] Hives   Mevacor  [lovastatin ] Other (See Comments)   Myalgias    Penicillin G    Penicillins Itching   Tolerated Cephalosporin Date: 03/17/21. Td Ancef  2g 07/26/21        Medication List     TAKE these medications    acetaminophen  500 MG tablet Commonly known as: TYLENOL  Take 1,000 mg by mouth daily as needed for mild pain (pain score 1-3).   allopurinol  100 MG tablet Commonly known as: ZYLOPRIM   Take 0.5 tablets (50 mg total) by mouth daily.   apixaban  5 MG Tabs tablet Commonly known as: ELIQUIS  Take 1 tablet (5 mg total) by mouth 2 (two) times daily.   Calmoseptine 0.44-20.6 % Oint Generic drug: Menthol -Zinc Oxide Apply topically.   carvedilol  12.5 MG tablet Commonly known as: COREG  Take 1 tablet (12.5 mg total) by mouth 2 (two) times daily with a meal.   ipratropium-albuterol  0.5-2.5 (3) MG/3ML Soln Commonly known as: DUONEB Take 3 mLs by nebulization every 6 (six) hours as needed.   lactulose  10 GM/15ML  solution Commonly known as: CHRONULAC  TAKE 15 MILLILITERS BY MOUTH THREE TIMESDAILY   levothyroxine  50 MCG tablet Commonly known as: SYNTHROID  Take 1 tablet (50 mcg total) by mouth daily before breakfast. Overdue for Annual appt must see provider for future refills   magnesium  oxide 400 MG tablet Commonly known as: MAG-OX Take 1 tablet (400 mg total) by mouth 2 (two) times daily.   omeprazole  40 MG capsule Commonly known as: PRILOSEC Take 1 capsule (40 mg total) by mouth daily.   polyethylene glycol 17 g packet Commonly known as: MIRALAX  / GLYCOLAX  Take 17 g by mouth 2 (two) times daily.   promethazine -dextromethorphan  6.25-15 MG/5ML syrup Commonly known as: PROMETHAZINE -DM Take 5 mLs by mouth 4 (four) times daily as needed for cough.   rifaximin  550 MG Tabs tablet Commonly known as: XIFAXAN  Take 550 mg by mouth 2 (two) times daily.   spironolactone  25 MG tablet Commonly known as: ALDACTONE  Take 1 tablet (25 mg total) by mouth daily. Restart in 3 days Start taking on: May 16, 2024 What changed:  additional instructions These instructions start on May 16, 2024. If you are unsure what to do until then, ask your doctor or other care provider.   torsemide  20 MG tablet Commonly known as: DEMADEX  Take 3 tablets (60 mg total) by mouth daily. Restart tomorrow Start taking on: May 14, 2024 What changed: additional instructions   traMADol  50 MG tablet Commonly known as: ULTRAM  Take 1 tablet (50 mg total) by mouth every 6 (six) hours as needed for severe pain (pain score 7-10).   triamcinolone  cream 0.1 % Commonly known as: KENALOG  Apply 1 Application topically 2 (two) times daily. To dry patches on BLE and bilateral feet               Discharge Care Instructions  (From admission, onward)           Start     Ordered   05/13/24 0000  Discharge wound care:       Comments: routine   05/13/24 0946           Allergies  Allergen Reactions   Chlorhexidine   Gluconate    Hibiclens  [Chlorhexidine  Gluconate] Hives   Mevacor  [Lovastatin ] Other (See Comments)    Myalgias    Penicillin G    Penicillins Itching    Tolerated Cephalosporin Date: 03/17/21. Td Ancef  2g 07/26/21    Follow-up Information     Plotnikov, [name], MD Follow up in 1 week(s).   Specialty: Internal Medicine Why: please check BMP at follow up Contact information: [address] [place] [address] [phone]                  The results of significant diagnostics from this hospitalization (including imaging, microbiology, ancillary and laboratory) are listed below for reference.    Significant Diagnostic Studies: CT SHOULDER LEFT WO CONTRAST Result Date: 05/11/2024 CLINICAL DATA:  Shoulder  trauma, instability or dislocation suspected, xray done Pain EXAM: CT OF THE UPPER LEFT EXTREMITY WITHOUT CONTRAST TECHNIQUE: Multidetector CT imaging of the left shoulder was performed according to the standard protocol. RADIATION DOSE REDUCTION: This exam was performed according to the departmental dose-optimization program which includes automated exposure control, adjustment of the mA and/or kV according to patient size and/or use of iterative reconstruction technique. COMPARISON:  Radiographs 08/20/2023 and 05/11/2023. CT of the right shoulder 05/09/2024. Chest CT 01/05/2021. FINDINGS: Bones/Joint/Cartilage No evidence of acute fracture or dislocation. The humeral head is located. There is a nonacute appearing fracture involving the base of the left scapular acromion. This has sclerotic margins, suspicious for nonunion. There is additional deformity more distally within the acromion with up to 1.5 cm of displacement (image 23/3), possibly postsurgical. The scapular body is intact. There is advanced glenohumeral arthropathy with marked joint space narrowing and osteophytes of the humeral head and glenoid. Associated small shoulder joint effusion with small intra-articular  loose bodies. There are moderate acromioclavicular degenerative changes with undersurface erosion of the distal clavicle and acromion. As above, there is deformity of the tip of the acromion with displacement, possibly postsurgical. The osseous and articular findings within the left shoulder are new compared with the chest CT from 01/05/2021. Ligaments Suboptimally assessed by CT. Muscles and Tendons At least moderate supraspinatus muscular atrophy. Otherwise mild generalized rotator cuff muscular atrophy. Marked narrowing of the subacromial space anteriorly implying a chronic supraspinatus tendon tear. Soft tissues No focal periarticular fluid collection, foreign body or soft tissue emphysema. Left subclavian pacemaker noted with chronic fibrosis anteriorly in the left upper lobe, likely related to prior radiation therapy. There are surgical clips in the left axilla. IMPRESSION: 1. No evidence of acute fracture or dislocation. 2. Nonacute appearing fracture involving the base of the left scapular acromion with sclerotic margins, suspicious for nonunion. Additional deformity of the tip of the acromion with significant displacement, possibly postsurgical. Given the rapid progression from chest CT of 3 years ago and the contralateral right shoulder findings, consider neuropathic arthropathy. 3. Advanced glenohumeral arthropathy with small shoulder joint effusion and small intra-articular loose bodies. 4. Moderate acromioclavicular degenerative changes with undersurface erosion of the distal clavicle and acromion. 5. At least moderate supraspinatus muscular atrophy with marked narrowing of the subacromial space anteriorly implying a chronic supraspinatus tendon tear. Electronically Signed   By: Elsie Perone M.D.   On: 05/11/2024 11:21   DG Shoulder Left Result Date: 05/10/2024 CLINICAL DATA:  Unwitnessed fall 7 hours ago.  Shoulder pain. EXAM: LEFT SHOULDER - 2+ VIEW COMPARISON:  None Available. FINDINGS: No  evidence for an acute fracture. Acromioclavicular joint not well demonstrated on the current study due to positioning. Degenerative changes are noted in the glenohumeral joint. No evidence for shoulder dislocation. Apparent narrowing of the acromial humeral distance suggests chronic rotator cuff pathology. IMPRESSION: Study limited by osteopenia and positioning. While no gross fracture or dislocation evident, CT or MR imaging could be used to further evaluate as clinically warranted. Electronically Signed   By: Camellia Candle M.D.   On: 05/10/2024 12:08   CT Shoulder Right Wo Contrast Result Date: 05/09/2024 CLINICAL DATA:  Right shoulder injury from fall. EXAM: CT OF THE UPPER RIGHT EXTREMITY WITHOUT CONTRAST TECHNIQUE: Multidetector CT imaging of the upper right extremity was performed according to the standard protocol. RADIATION DOSE REDUCTION: This exam was performed according to the departmental dose-optimization program which includes automated exposure control, adjustment of the mA and/or kV according to patient  size and/or use of iterative reconstruction technique. COMPARISON:  Same day radiograph FINDINGS: Bones/Joint/Cartilage The humeral head is not visualized, presumed postsurgical. Age indeterminate anterior dislocation of the humeral diaphysis. Age indeterminate fracture of the inferior glenoid (series 7/image 61). Advanced degenerative changes about the glenoid with moderate effusion. Ligaments Suboptimally assessed by CT. Muscles and Tendons Diffuse rotator cuff and deltoid atrophy. Soft tissues Interlobular septal thickening in the right lung suspicious for interstitial edema. Small right pleural effusion. IMPRESSION: 1. Age indeterminate anterior dislocation of the humeral diaphysis. Postsurgical absence of the humeral head. 2. Age indeterminate fracture of the inferior glenoid. 3. Advanced degenerative changes about the glenoid with moderate effusion. 4. Interlobular septal thickening in the  right lung suspicious for interstitial edema. Small right pleural effusion. Electronically Signed   By: Norman Gatlin M.D.   On: 05/09/2024 19:39   CT Head Wo Contrast Result Date: 05/09/2024 CLINICAL DATA:  Head trauma, fall EXAM: CT HEAD WITHOUT CONTRAST CT CERVICAL SPINE WITHOUT CONTRAST TECHNIQUE: Multidetector CT imaging of the head and cervical spine was performed following the standard protocol without intravenous contrast. Multiplanar CT image reconstructions of the cervical spine were also generated. RADIATION DOSE REDUCTION: This exam was performed according to the departmental dose-optimization program which includes automated exposure control, adjustment of the mA and/or kV according to patient size and/or use of iterative reconstruction technique. COMPARISON:  08/20/2023 FINDINGS: CT HEAD FINDINGS Brain: No evidence of acute infarction, hemorrhage, hydrocephalus, extra-axial collection or mass lesion/mass effect. Periventricular white matter hypodensity. Vascular: No hyperdense vessel or unexpected calcification. Skull: Normal. Negative for fracture or focal lesion. Sinuses/Orbits: No acute finding. Other: None. CT CERVICAL SPINE FINDINGS Alignment: Degenerative straightening of the normal cervical lordosis. Skull base and vertebrae: No acute fracture. No primary bone lesion or focal pathologic process. Soft tissues and spinal canal: No prevertebral fluid or swelling. No visible canal hematoma. Disc levels: Moderate to severe multilevel cervical disc degenerative disease from C3-C7. Upper chest: Negative. Other: None. IMPRESSION: 1. No acute intracranial pathology. Small-vessel white matter disease. 2. No fracture or static subluxation of the cervical spine. 3. Moderate to severe multilevel cervical disc degenerative disease from C3-C7. Electronically Signed   By: Marolyn JONETTA Jaksch M.D.   On: 05/09/2024 19:08   CT Cervical Spine Wo Contrast Result Date: 05/09/2024 CLINICAL DATA:  Head trauma, fall  EXAM: CT HEAD WITHOUT CONTRAST CT CERVICAL SPINE WITHOUT CONTRAST TECHNIQUE: Multidetector CT imaging of the head and cervical spine was performed following the standard protocol without intravenous contrast. Multiplanar CT image reconstructions of the cervical spine were also generated. RADIATION DOSE REDUCTION: This exam was performed according to the departmental dose-optimization program which includes automated exposure control, adjustment of the mA and/or kV according to patient size and/or use of iterative reconstruction technique. COMPARISON:  08/20/2023 FINDINGS: CT HEAD FINDINGS Brain: No evidence of acute infarction, hemorrhage, hydrocephalus, extra-axial collection or mass lesion/mass effect. Periventricular white matter hypodensity. Vascular: No hyperdense vessel or unexpected calcification. Skull: Normal. Negative for fracture or focal lesion. Sinuses/Orbits: No acute finding. Other: None. CT CERVICAL SPINE FINDINGS Alignment: Degenerative straightening of the normal cervical lordosis. Skull base and vertebrae: No acute fracture. No primary bone lesion or focal pathologic process. Soft tissues and spinal canal: No prevertebral fluid or swelling. No visible canal hematoma. Disc levels: Moderate to severe multilevel cervical disc degenerative disease from C3-C7. Upper chest: Negative. Other: None. IMPRESSION: 1. No acute intracranial pathology. Small-vessel white matter disease. 2. No fracture or static subluxation of the cervical spine. 3. Moderate  to severe multilevel cervical disc degenerative disease from C3-C7. Electronically Signed   By: Marolyn JONETTA Jaksch M.D.   On: 05/09/2024 19:08   DG Knee Complete 4 Views Left Result Date: 05/09/2024 CLINICAL DATA:  Knee pain.  Fall. EXAM: LEFT KNEE - COMPLETE 4+ VIEW COMPARISON:  01/24/2024 FINDINGS: No acute fracture or dislocation. Chronic advanced tricompartmental osteoarthritis with joint space narrowing, spurring and articular irregularity. Chronic ununited  patellar fracture. No significant joint effusion. Generalized soft tissue edema. IMPRESSION: 1. No acute fracture or dislocation. 2. Chronic advanced tricompartmental osteoarthritis. 3. Chronic ununited patellar fracture. Electronically Signed   By: Andrea Gasman M.D.   On: 05/09/2024 18:39   DG Knee Complete 4 Views Right Result Date: 05/09/2024 CLINICAL DATA:  Knee pain.  Fall. EXAM: RIGHT KNEE - COMPLETE 4+ VIEW COMPARISON:  08/20/2023 FINDINGS: No acute fracture or dislocation. Tricompartmental osteoarthritis with joint space narrowing, spurring and articular irregularity. No significant joint effusion. No erosive change. Generalized soft tissue edema. IMPRESSION: 1. No acute fracture or dislocation of the right knee. 2. Advanced tricompartmental osteoarthritis. Electronically Signed   By: Andrea Gasman M.D.   On: 05/09/2024 18:38   DG Shoulder Right Result Date: 05/09/2024 CLINICAL DATA:  Pain after fall. EXAM: RIGHT SHOULDER - 2+ VIEW COMPARISON:  Shoulder radiograph 07/26/2021. Previous chest radiographs reviewed. FINDINGS: Chronic deformity of the right proximal humerus with absence of the humeral head, possibly postsurgical. There is no evidence of acute fracture. Chronic widening of the acromioclavicular joint. IMPRESSION: 1. No acute fracture of the right shoulder. 2. Chronic deformity of the right proximal humerus with absence of the humeral head, possibly postsurgical. Electronically Signed   By: Andrea Gasman M.D.   On: 05/09/2024 18:35   DG Pelvis Portable Result Date: 05/09/2024 CLINICAL DATA:  Pain after fall. EXAM: PORTABLE PELVIS 1-2 VIEWS COMPARISON:  08/20/2023 FINDINGS: The bones are subjectively under mineralized. The cortical margins of the bony pelvis are intact. No fracture. Pubic symphysis and sacroiliac joints are congruent. Both femoral heads are well-seated in the respective acetabula. IMPRESSION: No pelvic fracture. Electronically Signed   By: Andrea Gasman M.D.   On:  05/09/2024 18:34   DG Chest Portable 1 View Result Date: 05/09/2024 CLINICAL DATA:  Pain after fall. EXAM: PORTABLE CHEST 1 VIEW COMPARISON:  11/24/2023 FINDINGS: Left-sided pacemaker remains in place. Cardiomegaly is stable. Mediastinal contours are unchanged. Diffuse peribronchial and interstitial thickening suspicious for pulmonary edema. No pneumothorax. No significant pleural effusion. Chronic right proximal humeral deformity. Chronic left shoulder arthropathy. On limited assessment, no acute osseous findings. IMPRESSION: Cardiomegaly with diffuse peribronchial and interstitial thickening suspicious for pulmonary edema. Electronically Signed   By: Andrea Gasman M.D.   On: 05/09/2024 18:26    Microbiology: No results found for this or any previous visit (from the past 240 hours).   Labs: Basic Metabolic Panel: Recent Labs  Lab 05/09/24 1823 05/10/24 0513 05/11/24 0220 05/12/24 0328 05/13/24 0249  NA 138 140 140 140 140  K 4.6 4.1 3.9 3.8 4.0  CL 107 106 107 104 102  CO2 20* 19* 23 27 30   GLUCOSE 93 88 102* 100* 114*  BUN 26* 26* 27* 26* 30*  CREATININE 1.65* 1.61* 1.76* 1.76* 2.27*  CALCIUM 10.2 10.2 9.8 9.5 9.0  MG  --  2.2  --   --   --    Liver Function Tests: Recent Labs  Lab 05/09/24 1823  AST 19  ALT 16  ALKPHOS 139*  BILITOT 1.4*  PROT 7.1  ALBUMIN  3.8   No results for input(s): LIPASE, AMYLASE in the last 168 hours. No results for input(s): AMMONIA in the last 168 hours. CBC: Recent Labs  Lab 05/09/24 1823 05/10/24 0513  WBC 4.6 4.7  NEUTROABS 3.1  --   HGB 10.6* 10.7*  HCT 34.9* 34.8*  MCV 95.9 94.3  PLT 129* 131*   Cardiac Enzymes: Recent Labs  Lab 05/09/24 1823  CKTOTAL 114   BNP: BNP (last 3 results) Recent Labs    11/24/23 1044 05/09/24 1823  BNP 1,051.4* 480.2*    ProBNP (last 3 results) No results for input(s): PROBNP in the last 8760 hours.  CBG: No results for input(s): GLUCAP in the last 168  hours.     Signed:  Sigurd Pac MD.  [facility] 05/13/2024, 9:46 AM

## 2024-05-13 NOTE — TOC Transition Note (Signed)
 Transition of Care [facility]) - Discharge Note   Patient Details  Name: Katelyn Jackson MRN: [id number] Date of Birth: [birth year]  Transition of Care [facility]) CM/SW Contact:  Waddell Barnie Rama, RN Phone Number: 05/13/2024, 11:27 AM   Clinical Narrative:    For dc today, she is set up with [facility].  Her son Pama will transport her home.         Patient Goals and CMS Choice            Discharge Placement                       Discharge Plan and Services Additional resources added to the After Visit Summary for                                       Social Drivers of Health (SDOH) Interventions SDOH Screenings   Food Insecurity: No Food Insecurity (05/10/2024)  Housing: Low Risk  (05/10/2024)  Transportation Needs: No Transportation Needs (05/10/2024)  Utilities: Not At Risk (05/10/2024)  Alcohol  Screen: Low Risk  (06/15/2022)  Depression (PHQ2-9): Medium Risk (02/12/2024)  Financial Resource Strain: Low Risk  (06/15/2022)  Physical Activity: Inactive (06/15/2022)  Social Connections: Moderately Integrated (05/10/2024)  Stress: No Stress Concern Present (06/15/2022)  Tobacco Use: Low Risk  (05/10/2024)     Readmission Risk Interventions    11/28/2023    5:18 PM  Readmission Risk Prevention Plan  Transportation Screening Complete  Medication Review (RN Care Manager) Complete  HRI or Home Care Consult Complete  Palliative Care Screening Not Applicable

## 2024-05-13 NOTE — Progress Notes (Signed)
 Heart Failure Navigator Progress Note  Assessed for Heart & Vascular TOC clinic readiness.  Patient does not meet criteria due to being a patient with [facility]. No HF TOC.   Navigator will sign off at this time.   Stephane Haddock, BSN, Scientist, clinical (histocompatibility and immunogenetics) Only

## 2024-05-13 NOTE — TOC Progression Note (Addendum)
 Transition of Care [facility]) - Progression Note    Patient Details  Name: Katelyn Jackson MRN: [id number] Date of Birth: Jun 07, 1946  Transition of [facility]) CM/SW Contact  Waddell Barnie Rama, RN Phone Number: 05/13/2024, 10:11 AM  Clinical Narrative:    Per MD patient would like to go home with [facility], she does not want to go to the SNF.  NCM spoke with patient at the bedside, offered choice, she informed this NCM to call her son, [name].  NCM contacted [name], he is ok with [facility] since she had them before.  [name] states he will transport her home between 1 and 2 pm.  NCM made referral to Cindie with [facility], awaiting to hear back.  [facility] unable to take,  NCM made referral to [facility] with [facility] she is able to take referral.  Soc will begin 24 to 48 hrs post dc.          Expected Discharge Plan and Services         Expected Discharge Date: 05/13/24                                     Social Determinants of Health (SDOH) Interventions SDOH Screenings   Food Insecurity: No Food Insecurity (05/10/2024)  Housing: Low Risk  (05/10/2024)  Transportation Needs: No Transportation Needs (05/10/2024)  Utilities: Not At Risk (05/10/2024)  Alcohol  Screen: Low Risk  (06/15/2022)  Depression (PHQ2-9): Medium Risk (02/12/2024)  Financial Resource Strain: Low Risk  (06/15/2022)  Physical Activity: Inactive (06/15/2022)  Social Connections: Moderately Integrated (05/10/2024)  Stress: No Stress Concern Present (06/15/2022)  Tobacco Use: Low Risk  (05/10/2024)    Readmission Risk Interventions    11/28/2023    5:18 PM  Readmission Risk Prevention Plan  Transportation Screening Complete  Medication Review (RN Care Manager) Complete  HRI or Home Care Consult Complete  Palliative Care Screening Not Applicable

## 2024-05-15 ENCOUNTER — Telehealth: Payer: Self-pay | Admitting: *Deleted

## 2024-05-15 ENCOUNTER — Telehealth: Payer: Self-pay | Admitting: Internal Medicine

## 2024-05-15 DIAGNOSIS — I5022 Chronic systolic (congestive) heart failure: Secondary | ICD-10-CM

## 2024-05-15 DIAGNOSIS — Z9181 History of falling: Secondary | ICD-10-CM

## 2024-05-15 NOTE — Telephone Encounter (Unsigned)
 Copied from CRM [id number]. Topic: General - Other >> May 15, 2024  2:36 PM [name] SQUIBB wrote: Reason for CRM: Tom Development worker, international aid with [facility] is calling in to let Dr.Plotnikov that the start of care is scheduled for 07/10

## 2024-05-15 NOTE — Transitions of Care (Post Inpatient/ED Visit) (Signed)
 05/15/2024  Name: Katelyn Jackson MRN: [id number] DOB: May 22, 1946  Today's TOC FU Call Status: Today's TOC FU Call Status:: Successful TOC FU Call Completed TOC FU Call Complete Date: 05/15/24 Patient's Name and Date of Birth confirmed.  Transition Care Management Follow-up Telephone Call Date of Discharge: 05/13/24 Discharge Facility: [facility]) Type of Discharge: Inpatient Admission Primary Inpatient Discharge Diagnosis:: Mechanical fall with extended down time; Acuteon chronic CHF exacerbation How have you been since you were released from the hospital?: Better (per son: She is better; I work during the day as a courier so I check in with her throughout the day. Other than that, she is there by herself.  She won't answer the phone, I handle all of her medical affairs for her) Any questions or concerns?: No  Items Reviewed: Did you receive and understand the discharge instructions provided?: Yes (Reviewed briefly with patient's son/ varegiver who verbalizes good understanding of same - he is at work, does not have papers in fornt of him for detailed review) Medications obtained,verified, and reconciled?: No (Son/ caregiver reports self-manages medications and denies questions/ concerns around medications today) Medications Not Reviewed Reasons:: Other: (Son/ caregiver is not near patient's medication list- he is at work; he is agreeable to review during next week's TOC call, post- scheduled hospital follow up visit with PCP) Any new allergies since your discharge?: No Dietary orders reviewed?: Yes Type of Diet Ordered:: I fix her regular food, she has a good appetite Do you have support at home?: Yes People in Home [RPT]: child(ren), adult Name of Support/Comfort Primary Source: Son reports patient is essentially independent in self-care activities; however-she is wheelchair bound and home alone when son is at work- he reports he checks in on her periodically throughout each  day- he is a courier and reports going by the home 1-2 times per day during his work hours; son- assists as/ if needed/ indicated; reports he is interested in in-home care assistance: LCSW referral placed  Medications Reviewed Today: Medications Reviewed Today     Reviewed by Loana Salvaggio M, RN (Registered Nurse) on 05/15/24 at 1234  Med List Status: <None>   Medication Order Taking? Sig Documenting Provider Last Dose Status Informant  acetaminophen  (TYLENOL ) 500 MG tablet [id number] No Take 1,000 mg by mouth daily as needed for mild pain (pain score 1-3). [provider] Unknown Active Child, Pharmacy Records  allopurinol  (ZYLOPRIM ) 100 MG tablet [id number] No Take 0.5 tablets (50 mg total) by mouth daily. Plotnikov, Aleksei V, MD 05/08/2024 Bedtime Active Child, Pharmacy Records  apixaban  (ELIQUIS ) 5 MG TABS tablet [id number] No Take 1 tablet (5 mg total) by mouth 2 (two) times daily. Plotnikov, Aleksei V, MD 05/08/2024  2:00 PM Active Child, Pharmacy Records  carvedilol  (COREG ) 12.5 MG tablet [id number] No Take 1 tablet (12.5 mg total) by mouth 2 (two) times daily with a meal. Plotnikov, [name], MD 05/08/2024 Morning Active Child, Pharmacy Records  ipratropium-albuterol  (DUONEB) 0.5-2.5 (3) MG/3ML SOLN [id number] No Take 3 mLs by nebulization every 6 (six) hours as needed.  Patient not taking: Reported on 05/09/2024   Plotnikov, [name], MD Not Taking Active Child, Pharmacy Records  lactulose  (CHRONULAC ) 10 GM/15ML solution [id number] No TAKE 15 MILLILITERS BY MOUTH THREE TIMESDAILY Plotnikov, Aleksei V, MD 05/08/2024 Bedtime Active Child, Pharmacy Records  levothyroxine  (SYNTHROID ) 50 MCG tablet [id number] No Take 1 tablet (50 mcg total) by mouth daily before breakfast. Overdue for Annual appt must see provider for future refills Plotnikov, [name], MD  05/08/2024 Morning Active Child, Pharmacy Records  magnesium  oxide (MAG-OX) 400 MG tablet [id number] No Take 1 tablet (400 mg total) by mouth 2  (two) times daily. Plotnikov, Aleksei V, MD 05/08/2024 Bedtime Active Child, Pharmacy Records  Menthol -Zinc Oxide (CALMOSEPTINE) 0.44-20.6 % OINT [id number] No Apply topically. [provider] Past Month Active Child, Pharmacy Records  omeprazole  (PRILOSEC) 40 MG capsule [id number] No Take 1 capsule (40 mg total) by mouth daily. Plotnikov, [name], MD 05/08/2024 Morning Active Child, Pharmacy Records  polyethylene glycol (MIRALAX  / GLYCOLAX ) 17 g packet [id number] No Take 17 g by mouth 2 (two) times daily. Arrien, Elidia Sieving, MD 05/08/2024 Morning Active Child, Pharmacy Records  promethazine -dextromethorphan  (PROMETHAZINE -DM) 6.25-15 MG/5ML syrup [id number] No Take 5 mLs by mouth 4 (four) times daily as needed for cough. Plotnikov, [name], MD Unknown Active Child, Pharmacy Records  rifaximin  (XIFAXAN ) 550 MG TABS tablet [id number] No Take 550 mg by mouth 2 (two) times daily. [provider] 05/08/2024 Bedtime Active Child, Pharmacy Records           Med Note (WHITE, DONETA RAMAN   Fri May 10, 2024  7:53 AM) Patient received a #30 ds on 03/27/24 and would've run out by now.  spironolactone  (ALDACTONE ) 25 MG tablet [id number]  Take 1 tablet (25 mg total) by mouth daily. Restart in 3 days Fairy Frames, MD  Active   torsemide  (DEMADEX ) 20 MG tablet [id number]  Take 3 tablets (60 mg total) by mouth daily. Restart tomorrow Fairy Frames, MD  Active   traMADol  (ULTRAM ) 50 MG tablet [id number] No Take 1 tablet (50 mg total) by mouth every 6 (six) hours as needed for severe pain (pain score 7-10). Plotnikov, [name], MD Past Week Active Child, Pharmacy Records  triamcinolone  cream (KENALOG ) 0.1 % [id number] No Apply 1 Application topically 2 (two) times daily. To dry patches on BLE and bilateral feet Plotnikov, [name], MD Unknown Active Child, Pharmacy Records           Home Care and Equipment/Supplies: Were Home Health Services Ordered?: Yes Name of Home Health Agency:: [facility]: PT-  patient/ son declined recommended SNF placement Has Agency set up a time to come to your home?: Yes First Home Health Visit Date: 05/16/24 Any new equipment or medical supplies ordered?: No  Functional Questionnaire: Do you need assistance with bathing/showering or dressing?: Yes (son reports he supervises/ assists) Do you need assistance with meal preparation?: Yes (son reports he prepares all meals for patient- she is able to heat food up independently once food is prepared, when son is at work) Do you need assistance with eating?: No Do you have difficulty maintaining continence: No (son reports patient uses her wheelchair and walker to get to bathroom when I am at work) Do you need assistance with getting out of bed/getting out of a chair/moving?: Yes (son reports he supervises/ assists- reports patient calls him on his phone when she wakes up in the morning, and he goes by there to assist her in getting up and getting dressed) Do you have difficulty managing or taking your medications?: No (son reports patient is completely independent in managing medications; states she knows her medications very well)  Follow up appointments reviewed: PCP Follow-up appointment confirmed?: Yes Date of PCP follow-up appointment?: 05/20/24 Follow-up Provider: PCP- Dr. Garald Specialist [facility] Follow-up appointment confirmed?: NA (verified not indicated per hospital discharging provider discharge notes) Do you need transportation to your follow-up appointment?: No Do you understand care options if your condition(s) worsen?:  Yes-patient verbalized understanding  SDOH Interventions Today    Flowsheet Row Most Recent Value  SDOH Interventions   Food Insecurity Interventions Intervention Not Indicated  [per caregiver/ son report]  Housing Interventions Intervention Not Indicated  [per caregiver/ son report]  Transportation Interventions Intervention Not Indicated  [per caregiver/ son report: he  provides all transportation for patient]  Utilities Interventions Intervention Not Indicated  [per caregiver/ son report]   Successfully enrolled into 30-day TOC program  See TOC assessment tabs for additional assessment/ TOC intervention information  Plan for next week's call: Review medications: full review Review medication adherence Review PCP provider office visit/ labs/ medication changes from hospital follow up visit scheduled 05/20/24 Review upcoming provider office visits Review home health PT visits Review daily weights since recent hospital discharge Reinforce education re: self health management of CHF/ daily weights Assess any new falls post- hospital discharge Ensure VBCI LCSW outreach post- referral 05/15/24  Pls call/ message for questions,  Julis Haubner Mckinney Parisha Beaulac, RN, BSN, CCRN Alumnus RN Care Manager  Transitions of Care  VBCI - [facility] [phone]: direct office

## 2024-05-16 ENCOUNTER — Telehealth: Payer: Self-pay | Admitting: *Deleted

## 2024-05-16 NOTE — Telephone Encounter (Signed)
 Copied from CRM [id number]. Topic: General - Other >> May 16, 2024  9:47 AM Mesmerise C wrote: Reason for CRM: Olam from [facility] stated son will be out of town and would like the start of care date to be pushed to tomorrow

## 2024-05-16 NOTE — Progress Notes (Signed)
 Complex Care Management Note Care Guide Note  05/16/2024 Name: Katelyn Jackson MRN: [id number] DOB: 21-Mar-1946   Complex Care Management Outreach Attempts: An unsuccessful telephone outreach was attempted today to offer the patient information about available complex care management services.  Follow Up Plan:  Additional outreach attempts will be made to offer the patient complex care management information and services.   Encounter Outcome:  No Answer  Thedford Franks, CMA [place]  [facility], [facility] Guide Direct Dial: [phone]  Fax: [phone] Website: [email or web address]

## 2024-05-16 NOTE — Telephone Encounter (Signed)
 Noted. Thanks.

## 2024-05-17 DIAGNOSIS — D631 Anemia in chronic kidney disease: Secondary | ICD-10-CM | POA: Diagnosis not present

## 2024-05-17 DIAGNOSIS — H1045 Other chronic allergic conjunctivitis: Secondary | ICD-10-CM | POA: Diagnosis not present

## 2024-05-17 DIAGNOSIS — I493 Ventricular premature depolarization: Secondary | ICD-10-CM | POA: Diagnosis not present

## 2024-05-17 DIAGNOSIS — N1832 Chronic kidney disease, stage 3b: Secondary | ICD-10-CM | POA: Diagnosis not present

## 2024-05-17 DIAGNOSIS — D696 Thrombocytopenia, unspecified: Secondary | ICD-10-CM | POA: Diagnosis not present

## 2024-05-17 DIAGNOSIS — K5641 Fecal impaction: Secondary | ICD-10-CM | POA: Diagnosis not present

## 2024-05-17 DIAGNOSIS — I5023 Acute on chronic systolic (congestive) heart failure: Secondary | ICD-10-CM | POA: Diagnosis not present

## 2024-05-17 DIAGNOSIS — I130 Hypertensive heart and chronic kidney disease with heart failure and stage 1 through stage 4 chronic kidney disease, or unspecified chronic kidney disease: Secondary | ICD-10-CM | POA: Diagnosis not present

## 2024-05-17 DIAGNOSIS — I4821 Permanent atrial fibrillation: Secondary | ICD-10-CM | POA: Diagnosis not present

## 2024-05-17 NOTE — Progress Notes (Signed)
 Complex Care Management Note  Care Guide Note 05/17/2024 Name: Katelyn Jackson MRN: [id number] DOB: July 28, 1946  Katelyn Jackson is a 77 y.o. year old female who sees Plotnikov, [name], MD for primary care. I reached out to Dick Reeves by phone today to offer complex care management services.  [name] was given information about Complex Care Management services today including:   The Complex Care Management services include support from the care team which includes your Nurse Care Manager, Clinical Social Worker, or Pharmacist.  The Complex Care Management team is here to help remove barriers to the health concerns and goals most important to you. Complex Care Management services are voluntary, and the patient may decline or stop services at any time by request to their care team member.   Complex Care Management Consent Status: Patient agreed to services and verbal consent obtained.   Follow up plan:  Telephone appointment with complex care management team member scheduled for:  05/27/2024  Encounter Outcome:  Patient Scheduled  Thedford Franks, CMA [place]  [facility], [facility] Guide Direct Dial: [phone]  Fax: [phone] Website: [email or web address]

## 2024-05-20 ENCOUNTER — Inpatient Hospital Stay: Admitting: Internal Medicine

## 2024-05-21 ENCOUNTER — Telehealth: Payer: Self-pay | Admitting: Internal Medicine

## 2024-05-21 DIAGNOSIS — I130 Hypertensive heart and chronic kidney disease with heart failure and stage 1 through stage 4 chronic kidney disease, or unspecified chronic kidney disease: Secondary | ICD-10-CM | POA: Diagnosis not present

## 2024-05-21 DIAGNOSIS — N1832 Chronic kidney disease, stage 3b: Secondary | ICD-10-CM | POA: Diagnosis not present

## 2024-05-21 DIAGNOSIS — D696 Thrombocytopenia, unspecified: Secondary | ICD-10-CM | POA: Diagnosis not present

## 2024-05-21 DIAGNOSIS — I5023 Acute on chronic systolic (congestive) heart failure: Secondary | ICD-10-CM | POA: Diagnosis not present

## 2024-05-21 DIAGNOSIS — D631 Anemia in chronic kidney disease: Secondary | ICD-10-CM | POA: Diagnosis not present

## 2024-05-21 DIAGNOSIS — K5641 Fecal impaction: Secondary | ICD-10-CM | POA: Diagnosis not present

## 2024-05-21 DIAGNOSIS — H1045 Other chronic allergic conjunctivitis: Secondary | ICD-10-CM | POA: Diagnosis not present

## 2024-05-21 DIAGNOSIS — I493 Ventricular premature depolarization: Secondary | ICD-10-CM | POA: Diagnosis not present

## 2024-05-21 DIAGNOSIS — I4821 Permanent atrial fibrillation: Secondary | ICD-10-CM | POA: Diagnosis not present

## 2024-05-21 NOTE — Telephone Encounter (Signed)
 Copied from CRM [id number]. Topic: Clinical - Home Health Verbal Orders >> May 21, 2024 10:26 AM [name] SQUIBB wrote: Caller/Agency: [facility]  Callback Number: [phone] OK to leave a VM Service Requested: Occupational Therapy Frequency: 1 week 7  Any new concerns about the patient? No

## 2024-05-21 NOTE — Telephone Encounter (Signed)
 Copied from CRM [id number]. Topic: Clinical - Home Health Verbal Orders >> May 21, 2024  9:44 AM [place] J wrote: Caller/Agency: [place] / [facility] Callback Number: [phone] Service Requested: Skilled Nursing Frequency: 1 week 4 for Skilled Nursing ; 1 week 4 for Home Health Aid Any new concerns about the patient? No

## 2024-05-21 NOTE — Telephone Encounter (Signed)
>>   May 21, 2024 10:26 AM [name] SQUIBB wrote: Caller/Agency: [facility]  Callback Number: [phone] OK to leave a VM Service Requested: Occupational Therapy Frequency: 1 week 7

## 2024-05-22 ENCOUNTER — Other Ambulatory Visit: Payer: Self-pay

## 2024-05-22 DIAGNOSIS — I4821 Permanent atrial fibrillation: Secondary | ICD-10-CM | POA: Diagnosis not present

## 2024-05-22 DIAGNOSIS — K5641 Fecal impaction: Secondary | ICD-10-CM | POA: Diagnosis not present

## 2024-05-22 DIAGNOSIS — D696 Thrombocytopenia, unspecified: Secondary | ICD-10-CM | POA: Diagnosis not present

## 2024-05-22 DIAGNOSIS — I130 Hypertensive heart and chronic kidney disease with heart failure and stage 1 through stage 4 chronic kidney disease, or unspecified chronic kidney disease: Secondary | ICD-10-CM | POA: Diagnosis not present

## 2024-05-22 DIAGNOSIS — I493 Ventricular premature depolarization: Secondary | ICD-10-CM | POA: Diagnosis not present

## 2024-05-22 DIAGNOSIS — H1045 Other chronic allergic conjunctivitis: Secondary | ICD-10-CM | POA: Diagnosis not present

## 2024-05-22 DIAGNOSIS — D631 Anemia in chronic kidney disease: Secondary | ICD-10-CM | POA: Diagnosis not present

## 2024-05-22 DIAGNOSIS — I5023 Acute on chronic systolic (congestive) heart failure: Secondary | ICD-10-CM | POA: Diagnosis not present

## 2024-05-22 DIAGNOSIS — N1832 Chronic kidney disease, stage 3b: Secondary | ICD-10-CM | POA: Diagnosis not present

## 2024-05-22 NOTE — Patient Instructions (Signed)
 Visit Information  Thank you for taking time to visit with me today. Please don't hesitate to contact me if I can be of assistance to you before our next scheduled telephone appointment.  Our next appointment is by telephone on Wednesday July 23rd at 11:00am  Following is a copy of your care plan:   Goals Addressed             This Visit's Progress    VBCI Transitions of Care (TOC) Care Plan       Problems: (reviewed 05/22/24) Recent Hospitalization for treatment of mechanical fall with extended down time- 7 hours/ CHF exacerbation; hospitalized July 4-7, 2025: declined SNF Rehab placement as recommended by hospital discharging team Functional/Safety concern: Essentially wheelchair bound: resides with her son only- he works as a [employer] manager for an [employer] and has flexibility to check in on patient throughout daytime/ work hours: but she is often at home by herself 05/15/24: son/ caregiver is verified on [facility] DPR- he speaks English and reports he manages all aspects of patient's medical affairs: reports patient will not answer phone when she is at home alone- please call patient's son for all outreaches:  Son assists patient with daily care activities around working during day time from 5:00 am- 2:00 pm Multiple hospitalizations primarily for mechanical falls: (5) unplanned hospitalizations x last 12 months; (1) x last 6 months Son reports he is looking into [employer] using internet Son interested in in-home care assistance options: VBCI LCSW referral placed - Care Guide reached out to the patient and an appointment is scheduled for 05/27/24 Limited social support: as above: patient home alone often on regular basis  Goal: (reviewed 05/22/24) Over the next 30 days, the patient will not experience hospital readmission  Interventions: (reviewed 05/22/24) Transitions of Care: week # 1/ day # 1 Durable Medical Equipment (DME) needs assessed with patient/caregiver Doctor  Visits  - discussed the importance of doctor visits Communication with PCP and covering RN CM re: enrollment into [facility] 30-day program Post discharge activity limitations prescribed by provider reviewed Confirmed uses assistive devices on regular basis, at baseline -- wheelchair primarily; walker- son reports patient calls him when she wakes up and he goes to their home from his workplace to get her out of bed and dressed, and then he returns to work- she uses wheelchair and walker to complete her bathroom needs when son is not present to assist Provided education/ reinforcement around fall prevention including possible complications of multiple ongoing falls Home Health PT services- confirmed active- [facility]; initial visit expected tomorrow: confirmed son has spoken to agency; encouraged patient's active participation/ engagement- son reports he will leave work to attend/ supervise scheduled PT visit tomorrow - PT was at the patient's home 05/22/24 Reviewed upcoming PCP provider office visit for hospital follow up- scheduled Monday 05/20/24- son to transport: confirmed patient is aware of all and has plans to attend as scheduled The 05/20/24 appointment was cancelled and rescheduled to 05/23/24 Son declined medication review: he is at work during [facility] call today- reports patient self- manages medications very well Advised him to have post- PCP hospital follow up visit AVS with him at time of next [facility] call for review of medications with RN CM: he is agreeable (not completed 05/22/24)  Heart Failure Interventions: (reviewed 05/22/24) Basic overview and discussion of pathophysiology of Heart Failure reviewed Assessed need for readable accurate scales in home Provided education about placing scale on hard, flat surface Advised patient to weigh  each morning after emptying bladder Discussed importance of daily weight and advised patient to weigh and record daily Reviewed role of diuretics in prevention of  fluid overload and management of heart failure; Discussed the importance of keeping all appointments with provider Assessed social determinant of health barriers  Confirmed patient is obtaining daily weights at home as per baseline: provided education / rationale/ reinforcement for daily weight monitoring at home as earliest indicator of fluid retention, along with weight gain guidelines/ action plan for weight gain; importance of taking diuretic as prescribed- son reports he routinely monitors weights that patient records on paper at home: he reports last weight I can remember since she got home from the hospital was 152 lbs; Advised him to have patient's home recordings of daily weights post- hospital discharge with him at time of next [facility] call for review of medications with RN CM: he is agreeable 05/22/24 - 150lbs is today's weight  Patient Self Care Activities: (reviewed 05/22/24) Attend all scheduled provider appointments Call provider office for new concerns or questions  Participate in Transition of Care Program/Attend TOC scheduled calls Take medications as prescribed   Use assistive devices (wheelchair/ walker) as needed to prevent falls Continue working with the home health physical therapy team that is involved in your care Weigh yourself every day to stay on top of early fluid retention: write down your weights every day so you remember what it is from day to day: follow the weight-gain guidelines and action plan to call your doctor if you gain more than 3 lbs overnight, or 5 lbs in one week Please listen out for a phone call and engage with the social worker that I am asking to call you If you believe your condition is getting worse- contact your care providers (doctors) promptly- reaching out to your doctor early when you have concerns can prevent you from having to go to the hospital  Plan:  Telephone follow up appointment with care management team member scheduled for:   05/29/24 for  TOC week # 3 with nurse Laine  Plan for next week's call: (reviewed 05/22/24) Review medications: full review Review medication adherence Review PCP provider office visit/ labs/ medication changes from hospital follow up visit scheduled 05/20/24 - Rescheduled to 05/23/24 Review upcoming provider office visits Review home health PT visits Review daily weights since recent hospital discharge Reinforce education re: self health management of CHF/ daily weights Assess any new falls post- hospital discharge Ensure VBCI LCSW outreach post- referral 05/15/24 - Completed           The patient has been provided with contact information for the care management team and has been advised to call with any health related questions or concerns.   Please call the care guide team at [phone] if you need to cancel or reschedule your appointment.   Please call the Suicide and Crisis Lifeline: 988 call the [place]  National Suicide Prevention Lifeline: (850)095-8432 or TTY: 228-390-2812 TTY (657) 657-0213) to talk to a trained counselor if you are experiencing a Mental Health or Behavioral Health Crisis or need someone to talk to.  Medford Balboa, BSN, RN [place]  VBCI - [employer] [facility] RN Care Manager [phone]

## 2024-05-22 NOTE — Telephone Encounter (Signed)
 Okay.  Thanks.

## 2024-05-22 NOTE — Transitions of Care (Post Inpatient/ED Visit) (Signed)
 Transition of Care week 2  Visit Note  05/22/2024  Name: Katelyn Jackson MRN: [id number]          DOB: February 24, 1946  Situation: Patient enrolled in [facility] 30-day program. Visit completed with Pama Memo by telephone.   Background:   Past Medical History:  Diagnosis Date   Acute lower UTI 06/19/2017   Arthritis    Breast lesion 12/07/2021   Cancer (HCC)    breast cancer   Chronic atrial fibrillation (HCC)    Chronic kidney disease    sees Dr Perri   Colonization with VRE (vancomycin -resistant enterococcus) 09/22/2021   Cramps, muscle, general    Dry skin 09/22/2021   Dyspnea on exertion    Dysrhythmia    a-fib,    ESBL E. coli carrier 09/22/2021   GERD (gastroesophageal reflux disease)    Headache    Herpes labialis 08/19/2021   Hyperlipidemia    Hypertension    Hypothyroidism    Lymphedema    Moderate to severe pulmonary hypertension (HCC)    Obesity    Osteoarthritis of right knee 08/19/2021   Personal history of chemotherapy    Personal history of radiation therapy    Pneumonia 12/2020   Presence of permanent cardiac pacemaker 01/21/2021   for bradycardia    Syncope 12/2020   needed a pacemaker   UTI (urinary tract infection) 01/05/2021    Assessment: Patient Reported Symptoms: Cognitive Cognitive Status: Alert and oriented to person, place, and time      Neurological Neurological Review of Symptoms: No symptoms reported    HEENT HEENT Symptoms Reported: No symptoms reported      Cardiovascular Cardiovascular Symptoms Reported: No symptoms reported Does patient have uncontrolled Hypertension?: No Cardiovascular Management Strategies: Medication therapy, Routine screening Weight: 150 lb (68 kg) Cardiovascular Comment: The patient weights daily. Reinforced the importance of daily weights  Respiratory Respiratory Symptoms Reported: No symptoms reported    Endocrine Endocrine Symptoms Reported: No symptoms reported Is patient diabetic?: No     Gastrointestinal Gastrointestinal Symptoms Reported: No symptoms reported Gastrointestinal Management Strategies: Coping strategies    Genitourinary Genitourinary Symptoms Reported: No symptoms reported    Integumentary Integumentary Symptoms Reported: No symptoms reported    Musculoskeletal Musculoskelatal Symptoms Reviewed: Difficulty walking, Limited mobility Musculoskeletal Management Strategies: Medical device, Routine screening, Coping strategies Musculoskeletal Comment: HHPT is actively working with the patient. The therapist was at the patient's home today Falls in the past year?: Yes Number of falls in past year: 2 or more Was there an injury with Fall?: Yes Fall Risk Category Calculator: 3 Patient Fall Risk Level: High Fall Risk Patient at Risk for Falls Due to: History of fall(s), Impaired mobility, Impaired balance/gait, Medication side effect Fall risk Follow up: Falls prevention discussed  Psychosocial Psychosocial Symptoms Reported: No symptoms reported         There were no vitals filed for this visit.  Medications Reviewed Today     Reviewed by Moises Reusing, RN (Case Manager) on 05/22/24 at 1118  Med List Status: <None>   Medication Order Taking? Sig Documenting Provider Last Dose Status Informant  acetaminophen  (TYLENOL ) 500 MG tablet [id number] No Take 1,000 mg by mouth daily as needed for mild pain (pain score 1-3). [provider] Unknown Active Child, Pharmacy Records  allopurinol  (ZYLOPRIM ) 100 MG tablet [id number] No Take 0.5 tablets (50 mg total) by mouth daily. Plotnikov, [name], MD 05/08/2024 Bedtime Active Child, Pharmacy Records  apixaban  (ELIQUIS ) 5 MG TABS tablet [id number] No Take 1 tablet (  5 mg total) by mouth 2 (two) times daily. Plotnikov, [name], MD 05/08/2024  2:00 PM Active Child, Pharmacy Records  carvedilol  (COREG ) 12.5 MG tablet [id number] No Take 1 tablet (12.5 mg total) by mouth 2 (two) times daily with a meal. Plotnikov,  [name], MD 05/08/2024 Morning Active Child, Pharmacy Records  ipratropium-albuterol  (DUONEB) 0.5-2.5 (3) MG/3ML SOLN [id number] No Take 3 mLs by nebulization every 6 (six) hours as needed.  Patient not taking: Reported on 05/09/2024   Plotnikov, [name], MD Not Taking Active Child, Pharmacy Records  lactulose  (CHRONULAC ) 10 GM/15ML solution [id number] No TAKE 15 MILLILITERS BY MOUTH THREE TIMESDAILY Plotnikov, Aleksei V, MD 05/08/2024 Bedtime Active Child, Pharmacy Records  levothyroxine  (SYNTHROID ) 50 MCG tablet [id number] No Take 1 tablet (50 mcg total) by mouth daily before breakfast. Overdue for Annual appt must see provider for future refills Plotnikov, [name], MD 05/08/2024 Morning Active Child, Pharmacy Records  magnesium  oxide (MAG-OX) 400 MG tablet [id number] No Take 1 tablet (400 mg total) by mouth 2 (two) times daily. Plotnikov, Aleksei V, MD 05/08/2024 Bedtime Active Child, Pharmacy Records  Menthol -Zinc Oxide (CALMOSEPTINE) 0.44-20.6 % OINT [id number] No Apply topically. [provider] Past Month Active Child, Pharmacy Records  omeprazole  (PRILOSEC) 40 MG capsule [id number] No Take 1 capsule (40 mg total) by mouth daily. Plotnikov, [name], MD 05/08/2024 Morning Active Child, Pharmacy Records  polyethylene glycol (MIRALAX  / GLYCOLAX ) 17 g packet [id number] No Take 17 g by mouth 2 (two) times daily. Arrien, Elidia Sieving, MD 05/08/2024 Morning Active Child, Pharmacy Records  promethazine -dextromethorphan  (PROMETHAZINE -DM) 6.25-15 MG/5ML syrup [id number] No Take 5 mLs by mouth 4 (four) times daily as needed for cough. Plotnikov, [name], MD Unknown Active Child, Pharmacy Records  rifaximin  (XIFAXAN ) 550 MG TABS tablet [id number] No Take 550 mg by mouth 2 (two) times daily. [provider] 05/08/2024 Bedtime Active Child, Pharmacy Records           Med Note (WHITE, DONETA RAMAN   Fri May 10, 2024  7:53 AM) Patient received a #30 ds on 03/27/24 and would've run out by now.  spironolactone   (ALDACTONE ) 25 MG tablet [id number]  Take 1 tablet (25 mg total) by mouth daily. Restart in 3 days Fairy Frames, MD  Active   torsemide  (DEMADEX ) 20 MG tablet [id number]  Take 3 tablets (60 mg total) by mouth daily. Restart tomorrow Fairy Frames, MD  Active   traMADol  (ULTRAM ) 50 MG tablet [id number] No Take 1 tablet (50 mg total) by mouth every 6 (six) hours as needed for severe pain (pain score 7-10). Plotnikov, [name], MD Past Week Active Child, Pharmacy Records  triamcinolone  cream (KENALOG ) 0.1 % [id number] No Apply 1 Application topically 2 (two) times daily. To dry patches on BLE and bilateral feet Plotnikov, [name], MD Unknown Active Child, Pharmacy Records            Recommendation:   PCP Follow-up Continue Current Plan of Care  Follow Up Plan:   Telephone follow-up in 1 week  Medford Balboa, BSN, RN [place]  [facility] - [facility] RN Care Manager [phone]

## 2024-05-22 NOTE — Telephone Encounter (Signed)
 Copied from CRM [id number]. Topic: Clinical - Home Health Verbal Orders >> May 22, 2024 11:43 AM [name] SQUIBB wrote: [facility] is calling in for an update on the verbal orders as they have not heard back, please advise the [phone] number

## 2024-05-23 ENCOUNTER — Ambulatory Visit: Admitting: Internal Medicine

## 2024-05-23 ENCOUNTER — Encounter: Payer: Self-pay | Admitting: Internal Medicine

## 2024-05-23 VITALS — BP 110/68 | HR 75 | Temp 98.6°F | Ht 60.0 in | Wt 149.0 lb

## 2024-05-23 DIAGNOSIS — I10 Essential (primary) hypertension: Secondary | ICD-10-CM

## 2024-05-23 DIAGNOSIS — R296 Repeated falls: Secondary | ICD-10-CM

## 2024-05-23 DIAGNOSIS — R531 Weakness: Secondary | ICD-10-CM | POA: Diagnosis not present

## 2024-05-23 DIAGNOSIS — K7460 Unspecified cirrhosis of liver: Secondary | ICD-10-CM

## 2024-05-23 DIAGNOSIS — N1832 Chronic kidney disease, stage 3b: Secondary | ICD-10-CM

## 2024-05-23 DIAGNOSIS — I509 Heart failure, unspecified: Secondary | ICD-10-CM

## 2024-05-23 NOTE — Progress Notes (Signed)
 Subjective:  Patient ID: Katelyn Jackson, female    DOB: 1946/06/19  Age: 78 y.o. MRN: [id number]  CC: Hospitalization Follow-up   HPI Liandra Mendia presents for falls, CHF, edema S/p recent hospital stay after a fall in the bathroom  Per hx: Admit date: 05/09/2024 Discharge date: 05/13/2024   Time spent: 45 minutes   Recommendations for Outpatient Follow-up:  PCP Dr. Garald in 1 week, please check BMP at follow-up Restart Aldactone  in 3 days, resume torsemide  tomorrow [facility] in 2 to 3 weeks Orthopedics Dr. Bonner Hair in 1-2 weeks   Discharge Diagnoses:  Principal Problem:   Acute on chronic heart failure with mildly reduced ejection fraction (HFmrEF, 41-49%) (HCC) Chronic left shoulder fracture   Fall at home, initial encounter   Chronic kidney disease, stage 3b (HCC)   Essential hypertension   Anemia of chronic disease   Liver cirrhosis (HCC)   Hypothyroidism   Cardiac pacemaker in situ   Permanent atrial fibrillation (HCC)   Thrombocytopenia (HCC)   CHF (congestive heart failure) (HCC)     Discharge Condition: Improved   Diet recommendation: Low-sodium, heart healthy        Filed Weights    05/11/24 0440 05/12/24 0500 05/13/24 0353  Weight: 80.8 kg 78.3 kg 77.4 kg      History of present illness:  78 chronically ill with permanent atrial fibrillation on Eliquis , bradycardia s/p PPM, chronic HFmrEF, CKD stage IIIb, hepatic cirrhosis, anemia of chronic disease, HTN, HLD, hypothyroidism, chronic lymphedema, gout who presented to the ED for evaluation after a fall at home. normally transports using a wheelchair but sometimes will use walker.  Reports a fall on 7/3 after slipped out of her wheelchair, unable to get up on her own and laid on the floor for 7 hours, chronic back and shoulder pain. -In addition also recent increased dyspnea, cough, chronic lymphedema, compliant with torsemide  -In the ED vital signs stable, CK1 1 4, creatinine 1.6, BNP  480, chest x-ray with cardiomegaly and pulm mild pulmonary edema -Extensive traumatic injury imaging was negative for significant acute injury.  Notable findings on CT right shoulder of age-indeterminate anterior dislocation of the right humeral diaphysis, postsurgical absence of the humeral head, age-indeterminate fracture of the inferior glenoid.   Hospital Course:    Acute on chronic HFmrEF: Patient presenting with recently increased dyspnea, chronic lymphedema, pulmonary edema on imaging.   - TTE 08/23/2023 showed EF 40-45%, global hypokinesis of LV, severe MR, RV dysfunction. - Improving with diuresis, 8 L negative, switch to oral torsemide  yesterday, creatinine slightly higher, hold torsemide  for 1 more day and resume tomorrow, continue Coreg  -Also advised to hold Aldactone  for 2 to 3 days on account of AKI/CKD -Please check BMP in 1 week, if creatinine remains above 2 would discontinue Aldactone  altogether - PT OT eval> SNF recommended, patient declines this, set up with home health services at discharge -[facility]-up with [facility] and PCP in 1 week   Permanent atrial fibrillation S/p PPM: - Continue Coreg  and Eliquis .   Fall at home: Left shoulder recent fracture Mechanical fall out of wheelchair to floor with prolonged downtime.  CK is not elevated.  Extensive imaging negative for acute traumatic injury.  Age indeterminant findings involving right humerus, site of prior shoulder hemiarthroplasty in 5/22, does not have any new symptoms on the right side -Reports some left shoulder pain, x-ray unremarkable but CT recommended, CT noted nonacute appearing fracture, malunion etc. all appear to be chronic findings, on further discussion  with patient and son they report that she fell and had shoulder pain 2 months ago, saw someone with orthopedics who recommended supportive care  -discussed with orthopedics, recommended follow-up with Dr. Cristy -Left knee x-ray with advanced  osteoarthritis - PT/OT eval, fall precautions -Patient declined short-term rehab, will set up home health services   CKD stage IIIb: Renal function stable.     Liver cirrhosis: Chronic and stable without evidence of hepatic encephalopathy.  - Continue rifaximin  and lactulose  -See discussion above regarding torsemide  and Aldactone    Hypertension: Continue Coreg , spironolactone .   Hypothyroidism: Continue Synthroid .   Thrombocytopenia: Mild in setting of hepatic cirrhosis without obvious bleeding.   Anemia of chronic disease: Hemoglobin stable at 10.6.   Gout: Continue allopurinol .   Discharge Exam:     Vitals:    05/13/24 0353 05/13/24 0735  BP: (!) 114/59 (!) 145/75  Pulse: 70 75  Resp:   18  Temp: 98.6 F (37 C) 97.7 F (36.5 C)  SpO2: 97% 95%    Gen: Awake, Alert, Oriented X 3,  HEENT: Neck obese unable to assess JVD Lungs: Decreased breath sounds at the bases CVS: S1S2/RRR Abd: soft, Non tender, non distended, BS present Extremities: 1+ edema, chronic lymphedema changes Right shoulder with deformity but no tenderness, has chronically limited range of motion on the right arm, left shoulder with mild discomfort noted with range of motion Skin: no new rashes on exposed skin     Outpatient Medications Prior to Visit  Medication Sig Dispense Refill   acetaminophen  (TYLENOL ) 500 MG tablet Take 1,000 mg by mouth daily as needed for mild pain (pain score 1-3).     allopurinol  (ZYLOPRIM ) 100 MG tablet Take 0.5 tablets (50 mg total) by mouth daily. 90 tablet 1   apixaban  (ELIQUIS ) 5 MG TABS tablet Take 1 tablet (5 mg total) by mouth 2 (two) times daily. 180 tablet 3   carvedilol  (COREG ) 12.5 MG tablet Take 1 tablet (12.5 mg total) by mouth 2 (two) times daily with a meal. 180 tablet 3   lactulose  (CHRONULAC ) 10 GM/15ML solution TAKE 15 MILLILITERS BY MOUTH THREE TIMESDAILY 946 mL 3   levothyroxine  (SYNTHROID ) 50 MCG tablet Take 1 tablet (50 mcg total) by mouth daily  before breakfast. Overdue for Annual appt must see provider for future refills 90 tablet 3   magnesium  oxide (MAG-OX) 400 MG tablet Take 1 tablet (400 mg total) by mouth 2 (two) times daily. 180 tablet 3   Menthol -Zinc Oxide (CALMOSEPTINE) 0.44-20.6 % OINT Apply topically.     omeprazole  (PRILOSEC) 40 MG capsule Take 1 capsule (40 mg total) by mouth daily. 90 capsule 3   polyethylene glycol (MIRALAX  / GLYCOLAX ) 17 g packet Take 17 g by mouth 2 (two) times daily. 14 each 0   promethazine -dextromethorphan  (PROMETHAZINE -DM) 6.25-15 MG/5ML syrup Take 5 mLs by mouth 4 (four) times daily as needed for cough. 240 mL 1   rifaximin  (XIFAXAN ) 550 MG TABS tablet Take 550 mg by mouth 2 (two) times daily.     spironolactone  (ALDACTONE ) 25 MG tablet Take 1 tablet (25 mg total) by mouth daily. Restart in 3 days     torsemide  (DEMADEX ) 20 MG tablet Take 3 tablets (60 mg total) by mouth daily. Restart tomorrow     traMADol  (ULTRAM ) 50 MG tablet Take 1 tablet (50 mg total) by mouth every 6 (six) hours as needed for severe pain (pain score 7-10). 120 tablet 1   triamcinolone  cream (KENALOG ) 0.1 % Apply 1 Application  topically 2 (two) times daily. To dry patches on BLE and bilateral feet 450 g 3   ipratropium-albuterol  (DUONEB) 0.5-2.5 (3) MG/3ML SOLN Take 3 mLs by nebulization every 6 (six) hours as needed. (Patient not taking: Reported on 05/23/2024) 270 mL 3   No facility-administered medications prior to visit.    ROS: Review of Systems  Constitutional:  Positive for fatigue. Negative for activity change, appetite change, chills and unexpected weight change.  HENT:  Negative for congestion, mouth sores and sinus pressure.   Eyes:  Negative for visual disturbance.  Respiratory:  Negative for cough and chest tightness.   Cardiovascular:  Positive for leg swelling.  Gastrointestinal:  Negative for abdominal pain and nausea.  Genitourinary:  Negative for difficulty urinating, frequency and vaginal pain.   Musculoskeletal:  Positive for gait problem. Negative for back pain.  Skin:  Negative for pallor, rash and wound.  Neurological:  Positive for weakness. Negative for dizziness, tremors, numbness and headaches.  Hematological:  Bruises/bleeds easily.  Psychiatric/Behavioral:  Negative for confusion, decreased concentration, self-injury, sleep disturbance and suicidal ideas.     Objective:  BP 110/68   Pulse 75   Temp 98.6 F (37 C) (Oral)   Ht 5' (1.524 m)   Wt 149 lb (67.6 kg)   LMP  (LMP Unknown)   SpO2 99%   BMI 29.10 kg/m   BP Readings from Last 3 Encounters:  05/23/24 110/68  05/13/24 (!) 145/75  02/12/24 122/76    Wt Readings from Last 3 Encounters:  05/23/24 149 lb (67.6 kg)  05/22/24 150 lb (68 kg)  05/15/24 152 lb (68.9 kg)    Physical Exam Constitutional:      General: She is not in acute distress.    Appearance: She is well-developed.  HENT:     Head: Normocephalic.     Right Ear: External ear normal.     Left Ear: External ear normal.     Nose: Nose normal.  Eyes:     General:        Right eye: No discharge.        Left eye: No discharge.     Conjunctiva/sclera: Conjunctivae normal.     Pupils: Pupils are equal, round, and reactive to light.  Neck:     Thyroid : No thyromegaly.     Vascular: No JVD.     Trachea: No tracheal deviation.  Cardiovascular:     Rate and Rhythm: Normal rate and regular rhythm.     Heart sounds: Normal heart sounds.  Pulmonary:     Effort: No respiratory distress.     Breath sounds: No stridor. No wheezing.  Abdominal:     General: Bowel sounds are normal. There is no distension.     Palpations: Abdomen is soft. There is no mass.     Tenderness: There is no abdominal tenderness. There is no guarding or rebound.  Musculoskeletal:        General: No tenderness.     Cervical back: Normal range of motion and neck supple. No rigidity.     Right lower leg: Edema present.     Left lower leg: Edema present.   Lymphadenopathy:     Cervical: No cervical adenopathy.  Skin:    Findings: No erythema or rash.  Neurological:     Mental Status: She is oriented to person, place, and time. Mental status is at baseline.     Cranial Nerves: No cranial nerve deficit.     Motor: Weakness present. No  abnormal muscle tone.     Coordination: Coordination normal.     Gait: Gait abnormal.     Deep Tendon Reflexes: Reflexes normal.  Psychiatric:        Behavior: Behavior normal.        Thought Content: Thought content normal.        Judgment: Judgment normal.   In a w/c B lymphedema  Lab Results  Component Value Date   WBC 4.7 05/10/2024   HGB 10.7 (L) 05/10/2024   HCT 34.8 (L) 05/10/2024   PLT 131 (L) 05/10/2024   GLUCOSE 114 (H) 05/13/2024   CHOL 151 08/21/2023   TRIG 67 08/21/2023   HDL 63 08/21/2023   LDLCALC 75 08/21/2023   ALT 16 05/09/2024   AST 19 05/09/2024   NA 140 05/13/2024   K 4.0 05/13/2024   CL 102 05/13/2024   CREATININE 2.27 (H) 05/13/2024   BUN 30 (H) 05/13/2024   CO2 30 05/13/2024   TSH 3.16 02/12/2024   INR 1.4 (H) 08/20/2023   HGBA1C 4.8 02/12/2024    DG Shoulder Left Result Date: 05/10/2024 CLINICAL DATA:  Unwitnessed fall 7 hours ago.  Shoulder pain. EXAM: LEFT SHOULDER - 2+ VIEW COMPARISON:  None Available. FINDINGS: No evidence for an acute fracture. Acromioclavicular joint not well demonstrated on the current study due to positioning. Degenerative changes are noted in the glenohumeral joint. No evidence for shoulder dislocation. Apparent narrowing of the acromial humeral distance suggests chronic rotator cuff pathology. IMPRESSION: Study limited by osteopenia and positioning. While no gross fracture or dislocation evident, CT or MR imaging could be used to further evaluate as clinically warranted. Electronically Signed   By: Camellia Candle M.D.   On: 05/10/2024 12:08   CT Shoulder Right Wo Contrast Result Date: 05/09/2024 CLINICAL DATA:  Right shoulder injury from fall.  EXAM: CT OF THE UPPER RIGHT EXTREMITY WITHOUT CONTRAST TECHNIQUE: Multidetector CT imaging of the upper right extremity was performed according to the standard protocol. RADIATION DOSE REDUCTION: This exam was performed according to the departmental dose-optimization program which includes automated exposure control, adjustment of the mA and/or kV according to patient size and/or use of iterative reconstruction technique. COMPARISON:  Same day radiograph FINDINGS: Bones/Joint/Cartilage The humeral head is not visualized, presumed postsurgical. Age indeterminate anterior dislocation of the humeral diaphysis. Age indeterminate fracture of the inferior glenoid (series 7/image 61). Advanced degenerative changes about the glenoid with moderate effusion. Ligaments Suboptimally assessed by CT. Muscles and Tendons Diffuse rotator cuff and deltoid atrophy. Soft tissues Interlobular septal thickening in the right lung suspicious for interstitial edema. Small right pleural effusion. IMPRESSION: 1. Age indeterminate anterior dislocation of the humeral diaphysis. Postsurgical absence of the humeral head. 2. Age indeterminate fracture of the inferior glenoid. 3. Advanced degenerative changes about the glenoid with moderate effusion. 4. Interlobular septal thickening in the right lung suspicious for interstitial edema. Small right pleural effusion. Electronically Signed   By: Norman Gatlin M.D.   On: 05/09/2024 19:39   CT Head Wo Contrast Result Date: 05/09/2024 CLINICAL DATA:  Head trauma, fall EXAM: CT HEAD WITHOUT CONTRAST CT CERVICAL SPINE WITHOUT CONTRAST TECHNIQUE: Multidetector CT imaging of the head and cervical spine was performed following the standard protocol without intravenous contrast. Multiplanar CT image reconstructions of the cervical spine were also generated. RADIATION DOSE REDUCTION: This exam was performed according to the departmental dose-optimization program which includes automated exposure control,  adjustment of the mA and/or kV according to patient size and/or use of iterative reconstruction  technique. COMPARISON:  08/20/2023 FINDINGS: CT HEAD FINDINGS Brain: No evidence of acute infarction, hemorrhage, hydrocephalus, extra-axial collection or mass lesion/mass effect. Periventricular white matter hypodensity. Vascular: No hyperdense vessel or unexpected calcification. Skull: Normal. Negative for fracture or focal lesion. Sinuses/Orbits: No acute finding. Other: None. CT CERVICAL SPINE FINDINGS Alignment: Degenerative straightening of the normal cervical lordosis. Skull base and vertebrae: No acute fracture. No primary bone lesion or focal pathologic process. Soft tissues and spinal canal: No prevertebral fluid or swelling. No visible canal hematoma. Disc levels: Moderate to severe multilevel cervical disc degenerative disease from C3-C7. Upper chest: Negative. Other: None. IMPRESSION: 1. No acute intracranial pathology. Small-vessel white matter disease. 2. No fracture or static subluxation of the cervical spine. 3. Moderate to severe multilevel cervical disc degenerative disease from C3-C7. Electronically Signed   By: Marolyn JONETTA Jaksch M.D.   On: 05/09/2024 19:08   CT Cervical Spine Wo Contrast Result Date: 05/09/2024 CLINICAL DATA:  Head trauma, fall EXAM: CT HEAD WITHOUT CONTRAST CT CERVICAL SPINE WITHOUT CONTRAST TECHNIQUE: Multidetector CT imaging of the head and cervical spine was performed following the standard protocol without intravenous contrast. Multiplanar CT image reconstructions of the cervical spine were also generated. RADIATION DOSE REDUCTION: This exam was performed according to the departmental dose-optimization program which includes automated exposure control, adjustment of the mA and/or kV according to patient size and/or use of iterative reconstruction technique. COMPARISON:  08/20/2023 FINDINGS: CT HEAD FINDINGS Brain: No evidence of acute infarction, hemorrhage, hydrocephalus,  extra-axial collection or mass lesion/mass effect. Periventricular white matter hypodensity. Vascular: No hyperdense vessel or unexpected calcification. Skull: Normal. Negative for fracture or focal lesion. Sinuses/Orbits: No acute finding. Other: None. CT CERVICAL SPINE FINDINGS Alignment: Degenerative straightening of the normal cervical lordosis. Skull base and vertebrae: No acute fracture. No primary bone lesion or focal pathologic process. Soft tissues and spinal canal: No prevertebral fluid or swelling. No visible canal hematoma. Disc levels: Moderate to severe multilevel cervical disc degenerative disease from C3-C7. Upper chest: Negative. Other: None. IMPRESSION: 1. No acute intracranial pathology. Small-vessel white matter disease. 2. No fracture or static subluxation of the cervical spine. 3. Moderate to severe multilevel cervical disc degenerative disease from C3-C7. Electronically Signed   By: Marolyn JONETTA Jaksch M.D.   On: 05/09/2024 19:08   DG Knee Complete 4 Views Left Result Date: 05/09/2024 CLINICAL DATA:  Knee pain.  Fall. EXAM: LEFT KNEE - COMPLETE 4+ VIEW COMPARISON:  01/24/2024 FINDINGS: No acute fracture or dislocation. Chronic advanced tricompartmental osteoarthritis with joint space narrowing, spurring and articular irregularity. Chronic ununited patellar fracture. No significant joint effusion. Generalized soft tissue edema. IMPRESSION: 1. No acute fracture or dislocation. 2. Chronic advanced tricompartmental osteoarthritis. 3. Chronic ununited patellar fracture. Electronically Signed   By: Andrea Gasman M.D.   On: 05/09/2024 18:39   DG Knee Complete 4 Views Right Result Date: 05/09/2024 CLINICAL DATA:  Knee pain.  Fall. EXAM: RIGHT KNEE - COMPLETE 4+ VIEW COMPARISON:  08/20/2023 FINDINGS: No acute fracture or dislocation. Tricompartmental osteoarthritis with joint space narrowing, spurring and articular irregularity. No significant joint effusion. No erosive change. Generalized soft tissue  edema. IMPRESSION: 1. No acute fracture or dislocation of the right knee. 2. Advanced tricompartmental osteoarthritis. Electronically Signed   By: Andrea Gasman M.D.   On: 05/09/2024 18:38   DG Shoulder Right Result Date: 05/09/2024 CLINICAL DATA:  Pain after fall. EXAM: RIGHT SHOULDER - 2+ VIEW COMPARISON:  Shoulder radiograph 07/26/2021. Previous chest radiographs reviewed. FINDINGS: Chronic deformity of the right proximal  humerus with absence of the humeral head, possibly postsurgical. There is no evidence of acute fracture. Chronic widening of the acromioclavicular joint. IMPRESSION: 1. No acute fracture of the right shoulder. 2. Chronic deformity of the right proximal humerus with absence of the humeral head, possibly postsurgical. Electronically Signed   By: Andrea Gasman M.D.   On: 05/09/2024 18:35   DG Pelvis Portable Result Date: 05/09/2024 CLINICAL DATA:  Pain after fall. EXAM: PORTABLE PELVIS 1-2 VIEWS COMPARISON:  08/20/2023 FINDINGS: The bones are subjectively under mineralized. The cortical margins of the bony pelvis are intact. No fracture. Pubic symphysis and sacroiliac joints are congruent. Both femoral heads are well-seated in the respective acetabula. IMPRESSION: No pelvic fracture. Electronically Signed   By: Andrea Gasman M.D.   On: 05/09/2024 18:34   DG Chest Portable 1 View Result Date: 05/09/2024 CLINICAL DATA:  Pain after fall. EXAM: PORTABLE CHEST 1 VIEW COMPARISON:  11/24/2023 FINDINGS: Left-sided pacemaker remains in place. Cardiomegaly is stable. Mediastinal contours are unchanged. Diffuse peribronchial and interstitial thickening suspicious for pulmonary edema. No pneumothorax. No significant pleural effusion. Chronic right proximal humeral deformity. Chronic left shoulder arthropathy. On limited assessment, no acute osseous findings. IMPRESSION: Cardiomegaly with diffuse peribronchial and interstitial thickening suspicious for pulmonary edema. Electronically Signed   By:  Andrea Gasman M.D.   On: 05/09/2024 18:26    Assessment & Plan:   Problem List Items Addressed This Visit     Essential hypertension (Chronic)   BP Readings from Last 3 Encounters:  05/23/24 110/68  05/13/24 (!) 145/75  02/12/24 122/76         Liver cirrhosis (HCC)   On Torsemide , Aldactone       CKD stage 3b, GFR 30-44 ml/min (HCC) - Primary (Chronic)   Monitoring GFR Hydrate well      Relevant Orders   Comprehensive metabolic panel with GFR   CBC with Differential/Platelet   Weakness   Use a transport w/c for bathroom In PT Fall alert      CHF (congestive heart failure) (HCC)   On Torsemide , Aldactone       Relevant Orders   Comprehensive metabolic panel with GFR   CBC with Differential/Platelet   Falls frequently   Use a transport w/c for bathroom In PT Fall alert Discussed prevention         No orders of the defined types were placed in this encounter.     Follow-up: Return in about 6 weeks (around 07/04/2024) for a follow-up visit.  Marolyn Noel, MD

## 2024-05-23 NOTE — Assessment & Plan Note (Signed)
 On Torsemide , Aldactone 

## 2024-05-23 NOTE — Assessment & Plan Note (Signed)
 Use a transport w/c for bathroom In PT Fall alert

## 2024-05-23 NOTE — Assessment & Plan Note (Signed)
 Use a transport w/c for bathroom In PT Fall alert Discussed prevention

## 2024-05-23 NOTE — Assessment & Plan Note (Signed)
Monitoring GFR Hydrate well

## 2024-05-23 NOTE — Assessment & Plan Note (Signed)
 BP Readings from Last 3 Encounters:  05/23/24 110/68  05/13/24 (!) 145/75  02/12/24 122/76

## 2024-05-27 ENCOUNTER — Other Ambulatory Visit: Payer: Self-pay

## 2024-05-27 NOTE — Patient Instructions (Signed)
 Visit Information  Thank you for taking time to visit with me today. Please don't hesitate to contact me if I can be of assistance to you before our next scheduled appointment.  Our next appointment is by telephone on 06/10/2024 at 11;00 am Please call the care guide team at [phone] if you need to cancel or reschedule your appointment.   Following is a copy of your care plan:   Goals Addressed             This Visit's Progress    VBCI Social Work Care Plan: LCSW       Problems:   ADL IADL limitations; Son is requesting assistance with securing personal care assistant services.  CSW Clinical Goal(s):   Over the next 90days the Patient's son will work with Child psychotherapist to address concerns related to securing a Engineer, agricultural.  Interventions:  LCSW discussed the process for securing a personal care assistant. LCSW explained that a form would be sent to PCP and then to [facility]. LCSW discussed any concerns with patient's mental health. Patient's reports there are no concerns. No SDOH needs were identified. LCSW inquired if any other assistance was needed and patient's son stated just securing the personal care assistant.  Patient Goals/Self-Care Activities:  Coordinate with LCSW to assist with securing a personal care assistant.  Plan:   Telephone follow up appointment with care management team member scheduled for:  06/10/2024 at 11:00 am        Please call 911 if you are experiencing a Mental Health or Behavioral Health Crisis or need someone to talk to.  Patient verbalizes understanding of instructions and care plan provided today and agrees to view in MyChart. Active MyChart status and patient understanding of how to access instructions and care plan via MyChart confirmed with patient.     Olam Ally, MSW, LCSW [place]  [facility], [facility] Licensed Clinical Social Worker Direct Dial: [phone]

## 2024-05-27 NOTE — Patient Outreach (Signed)
 Complex Care Management   Visit Note  05/27/2024  Name:  Katelyn Jackson MRN: [id number] DOB: 19-Sep-1946  Situation: Referral received for Complex Care Management related to to securing a personal care assistant for patient. I obtained verbal consent from Caregiver.  Visit completed with patient's son  on the phone. LCSW spoke with patient's son for scheduled visit. Patient's son explained that he is requesting assistance with securing a personal care assistant. LCSW will start the process to secure a personal care assistant. Background:   Past Medical History:  Diagnosis Date   Acute lower UTI 06/19/2017   Arthritis    Breast lesion 12/07/2021   Cancer (HCC)    breast cancer   Chronic atrial fibrillation (HCC)    Chronic kidney disease    sees Dr Perri   Colonization with VRE (vancomycin -resistant enterococcus) 09/22/2021   Cramps, muscle, general    Dry skin 09/22/2021   Dyspnea on exertion    Dysrhythmia    a-fib,    ESBL E. coli carrier 09/22/2021   GERD (gastroesophageal reflux disease)    Headache    Herpes labialis 08/19/2021   Hyperlipidemia    Hypertension    Hypothyroidism    Lymphedema    Moderate to severe pulmonary hypertension (HCC)    Obesity    Osteoarthritis of right knee 08/19/2021   Personal history of chemotherapy    Personal history of radiation therapy    Pneumonia 12/2020   Presence of permanent cardiac pacemaker 01/21/2021   for bradycardia    Syncope 12/2020   needed a pacemaker   UTI (urinary tract infection) 01/05/2021    Assessment: Patient Reported Symptoms:  Cognitive Cognitive Status: Alert and oriented to person, place, and time Cognitive/Intellectual Conditions Management [RPT]: None reported or documented in medical history or problem list      Neurological Neurological Review of Symptoms: No symptoms reported    HEENT HEENT Symptoms Reported: No symptoms reported      Cardiovascular Cardiovascular Symptoms Reported: Not  assessed    Respiratory Respiratory Symptoms Reported: Not assesed    Endocrine Endocrine Symptoms Reported: Not assessed    Gastrointestinal Gastrointestinal Symptoms Reported: Not assessed      Genitourinary Genitourinary Symptoms Reported: Not assessed    Integumentary Integumentary Symptoms Reported: Not assessed    Musculoskeletal Musculoskelatal Symptoms Reviewed: Not assessed        Psychosocial Psychosocial Symptoms Reported: No symptoms reported Additional Psychological Details: Son reports no history of depression or anxiety     Quality of Family Relationships: supportive, helpful Do you feel physically threatened by others?: No      05/27/2024   11:24 AM  Depression screen PHQ 2/9  Decreased Interest 0  Down, Depressed, Hopeless 0  PHQ - 2 Score 0    There were no vitals filed for this visit.  Medications Reviewed Today     Reviewed by Sherren Olam FORBES KEN (Social Worker) on 05/27/24 at 1109  Med List Status: <None>   Medication Order Taking? Sig Documenting Provider Last Dose Status Informant  acetaminophen  (TYLENOL ) 500 MG tablet [id number] Yes Take 1,000 mg by mouth daily as needed for mild pain (pain score 1-3). [provider]  Active Child, Pharmacy Records  allopurinol  (ZYLOPRIM ) 100 MG tablet [id number] Yes Take 0.5 tablets (50 mg total) by mouth daily. Plotnikov, [name], MD  Active Child, Pharmacy Records  apixaban  (ELIQUIS ) 5 MG TABS tablet [id number] Yes Take 1 tablet (5 mg total) by mouth 2 (two) times daily. Plotnikov, Aleksei  V, MD  Active Child, Pharmacy Records  carvedilol  (COREG ) 12.5 MG tablet [id number] Yes Take 1 tablet (12.5 mg total) by mouth 2 (two) times daily with a meal. Plotnikov, [name], MD  Active Child, Pharmacy Records  ipratropium-albuterol  (DUONEB) 0.5-2.5 (3) MG/3ML SOLN [id number]  Take 3 mLs by nebulization every 6 (six) hours as needed.  Patient not taking: Reported on 05/23/2024   Plotnikov, [name], MD  Active  Child, Pharmacy Records  lactulose  (CHRONULAC ) 10 GM/15ML solution [id number] Yes TAKE 15 MILLILITERS BY MOUTH THREE TIMESDAILY Plotnikov, Aleksei V, MD  Active Child, Pharmacy Records  levothyroxine  (SYNTHROID ) 50 MCG tablet [id number] Yes Take 1 tablet (50 mcg total) by mouth daily before breakfast. Overdue for Annual appt must see provider for future refills Plotnikov, [name], MD  Active Child, Pharmacy Records  magnesium  oxide (MAG-OX) 400 MG tablet [id number] Yes Take 1 tablet (400 mg total) by mouth 2 (two) times daily. Plotnikov, Aleksei V, MD  Active Child, Pharmacy Records  Menthol -Zinc Oxide (CALMOSEPTINE) 0.44-20.6 % OINT [id number] Yes Apply topically. [provider]  Active Child, Pharmacy Records  omeprazole  [facility]) 40 MG capsule [id number] Yes Take 1 capsule (40 mg total) by mouth daily. Plotnikov, Aleksei V, MD  Active Child, Pharmacy Records  polyethylene glycol (MIRALAX  / GLYCOLAX ) 17 g packet [id number] Yes Take 17 g by mouth 2 (two) times daily. Arrien, Elidia Sieving, MD  Active Child, Pharmacy Records  promethazine -dextromethorphan  (PROMETHAZINE -DM) 6.25-15 MG/5ML syrup [id number] Yes Take 5 mLs by mouth 4 (four) times daily as needed for cough. Plotnikov, Aleksei V, MD  Active Child, Pharmacy Records  rifaximin  (XIFAXAN ) 550 MG TABS tablet [id number]  Take 550 mg by mouth 2 (two) times daily. [provider]  Active Child, Pharmacy Records           Med Note (WHITE, DONETA RAMAN   Fri May 10, 2024  7:53 AM) Patient received a #30 ds on 03/27/24 and would've run out by now.  spironolactone  (ALDACTONE ) 25 MG tablet [id number] Yes Take 1 tablet (25 mg total) by mouth daily. Restart in 3 days Fairy Frames, MD  Active   torsemide  (DEMADEX ) 20 MG tablet [id number] Yes Take 3 tablets (60 mg total) by mouth daily. Restart tomorrow Fairy Frames, MD  Active   traMADol  (ULTRAM ) 50 MG tablet [id number] Yes Take 1 tablet (50 mg total) by mouth every 6 (six) hours as needed for  severe pain (pain score 7-10). Plotnikov, [name], MD  Active Child, Pharmacy Records  triamcinolone  cream (KENALOG ) 0.1 % [id number] Yes Apply 1 Application topically 2 (two) times daily. To dry patches on BLE and bilateral feet Plotnikov, [name], MD  Active Child, Pharmacy Records            Recommendation:   Continue Current Plan of Care  Follow Up Plan:   Telephone follow-up 06/10/2024  Olam Ally, MSW, LCSW [place]  [facility] Licensed Clinical Social Worker Direct Dial: [phone]

## 2024-05-28 ENCOUNTER — Telehealth: Payer: Self-pay

## 2024-05-28 NOTE — Telephone Encounter (Signed)
 Copied from CRM [id number]. Topic: Clinical - Home Health Verbal Orders >> May 21, 2024 10:26 AM [name] SQUIBB wrote: Caller/Agency: [facility]  Callback Number: [phone] OK to leave a VM Service Requested: Occupational Therapy Frequency: 1 week 7  Any new concerns about the patient? No >> May 28, 2024  9:53 AM [name] F wrote: Home health agency called back to get a status on the verbal orders that were requested. There is an ok in the notes from the pcp. I tried to clarify with CAL but office was unable to clarify with nurse or provider at that time. Please contact [name] with a response. She asks if she can get a call back immediately so she can start services. Please advise [phone] [name]

## 2024-05-28 NOTE — Telephone Encounter (Signed)
 LDVM for [name] with the verbal okay for Service Requested: Occupational Therapy Frequency: 1 week 7

## 2024-05-28 NOTE — Telephone Encounter (Signed)
 Spoke with [place] [facility] and was able to give her verbal OK for  Callback Number: [phone] Service Requested: Skilled Nursing Frequency: 1 week 4 for Skilled Nursing ; 1 week 4 for Home Health Aid

## 2024-05-28 NOTE — Telephone Encounter (Signed)
 LDVM for [facility]  Callback Number: [phone] OK to leave a VM Service Requested: Occupational Therapy Frequency: 1 week 7

## 2024-05-29 ENCOUNTER — Other Ambulatory Visit: Payer: Self-pay | Admitting: *Deleted

## 2024-05-29 NOTE — Patient Instructions (Signed)
 Visit Information  Thank you for taking time to visit with me today. Please don't hesitate to contact me if I can be of assistance to you before our next scheduled telephone appointment.  Our next appointment is by telephone on Thursday 06/06/24 at 11:00 am  Please call the care guide team at [phone] if you need to cancel or reschedule your appointment.   Following are the goals we discussed today:  Patient Self Care Activities:  Attend all scheduled provider appointments Call provider office for new concerns or questions  Participate in Transition of Care Program/Attend TOC scheduled calls Take medications as prescribed   Use assistive devices (wheelchair/ walker) as needed to prevent falls Continue working with the home health physical therapy team that is involved in your care Weigh yourself every day to stay on top of early fluid retention: write down your weights every day so you remember what it is from day to day: follow the weight-gain guidelines and action plan to call your doctor if you gain more than 3 lbs overnight, or 5 lbs in one week Please keep in close contact with the social worker who is working with you If you believe your condition is getting worse- contact your care providers (doctors) promptly- reaching out to your doctor early when you have concerns can prevent you from having to go to the hospital  If you are experiencing a Mental Health or Behavioral Health Crisis or need someone to talk to, please  call the Suicide and Crisis Lifeline: 988 call the [place]  National Suicide Prevention Lifeline: 930-558-9194 or TTY: (604)866-8351 TTY (604)490-7911) to talk to a trained counselor call 1-800-273-TALK (toll free, 24 hour hotline) go to [facility] [address], [place] [phone]) call the [facility] Crisis Line: (520)147-0822 call 911   The patient's caregiver/ son verbalized understanding of instructions,  educational materials, and care plan provided today and DECLINED offer to receive copy of patient instructions, educational materials, and care plan.   Nettie Cromwell Mckinney Obaloluwa Delatte, RN, BSN, Media planner  Transitions of Care  [facility] - [facility] [phone]: direct office

## 2024-05-29 NOTE — Transitions of Care (Post Inpatient/ED Visit) (Signed)
 Transition of Care week 3/ day # 14  Visit Note  05/29/2024  Name: Katelyn Jackson MRN: [id number]          DOB: 06-Oct-1946  Situation: Patient enrolled in [facility] 30-day program. Visit completed with patient's son/ caregiver [name] by telephone.   HIPAA identifiers x 2 verified  Background:  Recent Hospitalization for treatment of mechanical fall with extended down time- 7 hours/ CHF exacerbation; hospitalized July 4-7, 2025: declined SNF Rehab placement as recommended by hospital discharging team Multiple hospitalizations primarily for mechanical falls: (5) unplanned hospitalizations x last 12 months; (1) x last 6 months  Initial Transition Care Management Follow-up Telephone Call    Past Medical History:  Diagnosis Date   Acute lower UTI 06/19/2017   Arthritis    Breast lesion 12/07/2021   Cancer (HCC)    breast cancer   Chronic atrial fibrillation (HCC)    Chronic kidney disease    sees Dr Perri   Colonization with VRE (vancomycin -resistant enterococcus) 09/22/2021   Cramps, muscle, general    Dry skin 09/22/2021   Dyspnea on exertion    Dysrhythmia    a-fib,    ESBL E. coli carrier 09/22/2021   GERD (gastroesophageal reflux disease)    Headache    Herpes labialis 08/19/2021   Hyperlipidemia    Hypertension    Hypothyroidism    Lymphedema    Moderate to severe pulmonary hypertension (HCC)    Obesity    Osteoarthritis of right knee 08/19/2021   Personal history of chemotherapy    Personal history of radiation therapy    Pneumonia 12/2020   Presence of permanent cardiac pacemaker 01/21/2021   for bradycardia    Syncope 12/2020   needed a pacemaker   UTI (urinary tract infection) 01/05/2021   Assessment:  Discussed current clinical condition with caregiver:  She is doing great.  No falls.  The home health team came out last week, we are expecting them to return sometime this week; my wife is staying with her for a few days while she is here, to help out.  I  spoke with the social worker and have ordered a life-alert system too.  The doctor visit with her PCP went fine; we looked over all of her medicines and he did not make any changes    Caregiver denies clinical concerns today  Patient Reported Symptoms: Cognitive Cognitive Status: Normal speech and language skills, Alert and oriented to person, place, and time (per caregiver/ son report)      Neurological Neurological Review of Symptoms: No symptoms reported (per caregiver/ son report)    HEENT HEENT Symptoms Reported: No symptoms reported (per caregiver/ son report)      Cardiovascular Cardiovascular Symptoms Reported: No symptoms reported Other Cardiovascular Symptoms: per caregiver/ son report: patient continues to monitor daily weights at home; he reviews recorded weight logs daily; reports weights are continuing to be between 149-150 lbs consistently; reinforced rationale for daily weight monitoring at home along with weight gain guidelines/ action plan for weight gain; importance of taking diuretic as prescribed Does patient have uncontrolled Hypertension?: No Cardiovascular Management Strategies: Medication therapy, Routine screening, Coping strategies, Adequate rest Weight: 149 lb (67.6 kg) (per son/ caregiver report)  Respiratory Respiratory Symptoms Reported: No symptoms reported Other Respiratory Symptoms: per son/ caregiver report: She is breathing fine, no cough, no wheezing, no problems Respiratory Management Strategies: Routine screening, Medication therapy, Coping strategies, Fluid modification, Adequate rest  Endocrine Endocrine Symptoms Reported: No symptoms reported (per son/ caregiver report)  Is patient diabetic?: No    Gastrointestinal Gastrointestinal Symptoms Reported: No symptoms reported Additional Gastrointestinal Details: per son/ caregiver report: She is eating very good, has a good appetite and having regular bowel movements Gastrointestinal Management  Strategies: Coping strategies    Genitourinary Genitourinary Symptoms Reported: No symptoms reported (per son/ caregiver report)    Integumentary Integumentary Symptoms Reported: No symptoms reported (per son/ caregiver report)    Musculoskeletal Musculoskelatal Symptoms Reviewed: No symptoms reported Additional Musculoskeletal Details: per son/ caregiver report: confirmed continues to use assistive devices on regular basis, at baseline -- wheelchair and walker; he denies new/ recent falls since last hospital discharge on 05/13/24; provided education/ reinforcement around fall prevention Musculoskeletal Management Strategies: Medical device, Routine screening, Coping strategies   Fall risk Follow up: Falls prevention discussed, Education provided  Psychosocial Psychosocial Symptoms Reported: No symptoms reported (per son/ caregiver report)         There were no vitals filed for this visit.  Medications Reviewed Today     Reviewed by Wilmore Holsomback M, RN (Registered Nurse) on 05/29/24 at 1100  Med List Status: <None>   Medication Order Taking? Sig Documenting Provider Last Dose Status Informant  acetaminophen  (TYLENOL ) 500 MG tablet [id number]  Take 1,000 mg by mouth daily as needed for mild pain (pain score 1-3). [provider]  Active Child, Pharmacy Records  allopurinol  (ZYLOPRIM ) 100 MG tablet [id number]  Take 0.5 tablets (50 mg total) by mouth daily. Plotnikov, [name], MD  Active Child, Pharmacy Records  apixaban  (ELIQUIS ) 5 MG TABS tablet [id number]  Take 1 tablet (5 mg total) by mouth 2 (two) times daily. Plotnikov, Aleksei V, MD  Active Child, Pharmacy Records  carvedilol  (COREG ) 12.5 MG tablet [id number]  Take 1 tablet (12.5 mg total) by mouth 2 (two) times daily with a meal. Plotnikov, [name], MD  Active Child, Pharmacy Records  ipratropium-albuterol  (DUONEB) 0.5-2.5 (3) MG/3ML SOLN [id number]  Take 3 mLs by nebulization every 6 (six) hours as needed.  Patient not  taking: Reported on 05/23/2024   Plotnikov, [name], MD  Active Child, Pharmacy Records  lactulose  (CHRONULAC ) 10 GM/15ML solution [id number]  TAKE 15 MILLILITERS BY MOUTH THREE TIMESDAILY Plotnikov, Aleksei V, MD  Active Child, Pharmacy Records  levothyroxine  (SYNTHROID ) 50 MCG tablet [id number]  Take 1 tablet (50 mcg total) by mouth daily before breakfast. Overdue for Annual appt must see provider for future refills Plotnikov, [name], MD  Active Child, Pharmacy Records  magnesium  oxide (MAG-OX) 400 MG tablet [id number]  Take 1 tablet (400 mg total) by mouth 2 (two) times daily. Plotnikov, Aleksei V, MD  Active Child, Pharmacy Records  Menthol -Zinc Oxide (CALMOSEPTINE) 0.44-20.6 % OINT [id number]  Apply topically. [provider]  Active Child, Pharmacy Records  omeprazole  (PRILOSEC) 40 MG capsule [id number]  Take 1 capsule (40 mg total) by mouth daily. Plotnikov, Aleksei V, MD  Active Child, Pharmacy Records  polyethylene glycol (MIRALAX  / GLYCOLAX ) 17 g packet [id number]  Take 17 g by mouth 2 (two) times daily. Arrien, Elidia Sieving, MD  Active Child, Pharmacy Records  promethazine -dextromethorphan  (PROMETHAZINE -DM) 6.25-15 MG/5ML syrup [id number]  Take 5 mLs by mouth 4 (four) times daily as needed for cough. Plotnikov, Aleksei V, MD  Active Child, Pharmacy Records  rifaximin  (XIFAXAN ) 550 MG TABS tablet [id number]  Take 550 mg by mouth 2 (two) times daily. [provider]  Active Child, Pharmacy Records           Med Note (WHITE, DONETA RAMAN   Fri  May 10, 2024  7:53 AM) Patient received a #30 ds on 03/27/24 and would've run out by now.  spironolactone  (ALDACTONE ) 25 MG tablet [id number]  Take 1 tablet (25 mg total) by mouth daily. Restart in 3 days Fairy Frames, MD  Active   torsemide  (DEMADEX ) 20 MG tablet [id number]  Take 3 tablets (60 mg total) by mouth daily. Restart tomorrow Fairy Frames, MD  Active   traMADol  (ULTRAM ) 50 MG tablet [id number]  Take 1 tablet (50 mg total) by mouth  every 6 (six) hours as needed for severe pain (pain score 7-10). Plotnikov, [name], MD  Active Child, Pharmacy Records  triamcinolone  cream (KENALOG ) 0.1 % [id number]  Apply 1 Application topically 2 (two) times daily. To dry patches on BLE and bilateral feet Plotnikov, [name], MD  Active Child, Pharmacy Records           Recommendation:   Continue Current Plan of Care  Follow Up Plan:   Telephone follow-up in 1 week  Pls call/ message for questions,  Cecillia Menees Mckinney Izael Bessinger, RN, BSN, CCRN Alumnus RN Care Manager  Transitions of Care  [facility] - [facility] [phone]: direct office

## 2024-05-30 ENCOUNTER — Other Ambulatory Visit: Payer: Self-pay | Admitting: Internal Medicine

## 2024-05-30 DIAGNOSIS — I493 Ventricular premature depolarization: Secondary | ICD-10-CM | POA: Diagnosis not present

## 2024-05-30 DIAGNOSIS — I130 Hypertensive heart and chronic kidney disease with heart failure and stage 1 through stage 4 chronic kidney disease, or unspecified chronic kidney disease: Secondary | ICD-10-CM | POA: Diagnosis not present

## 2024-05-30 DIAGNOSIS — N1832 Chronic kidney disease, stage 3b: Secondary | ICD-10-CM | POA: Diagnosis not present

## 2024-05-30 DIAGNOSIS — H1045 Other chronic allergic conjunctivitis: Secondary | ICD-10-CM | POA: Diagnosis not present

## 2024-05-30 DIAGNOSIS — D631 Anemia in chronic kidney disease: Secondary | ICD-10-CM | POA: Diagnosis not present

## 2024-05-30 DIAGNOSIS — I4821 Permanent atrial fibrillation: Secondary | ICD-10-CM | POA: Diagnosis not present

## 2024-05-30 DIAGNOSIS — D696 Thrombocytopenia, unspecified: Secondary | ICD-10-CM | POA: Diagnosis not present

## 2024-05-30 DIAGNOSIS — I5023 Acute on chronic systolic (congestive) heart failure: Secondary | ICD-10-CM | POA: Diagnosis not present

## 2024-05-30 DIAGNOSIS — K5641 Fecal impaction: Secondary | ICD-10-CM | POA: Diagnosis not present

## 2024-06-03 ENCOUNTER — Telehealth: Payer: Self-pay | Admitting: Internal Medicine

## 2024-06-03 DIAGNOSIS — D696 Thrombocytopenia, unspecified: Secondary | ICD-10-CM | POA: Diagnosis not present

## 2024-06-03 DIAGNOSIS — I4821 Permanent atrial fibrillation: Secondary | ICD-10-CM | POA: Diagnosis not present

## 2024-06-03 DIAGNOSIS — I493 Ventricular premature depolarization: Secondary | ICD-10-CM | POA: Diagnosis not present

## 2024-06-03 DIAGNOSIS — I5023 Acute on chronic systolic (congestive) heart failure: Secondary | ICD-10-CM | POA: Diagnosis not present

## 2024-06-03 DIAGNOSIS — N1832 Chronic kidney disease, stage 3b: Secondary | ICD-10-CM | POA: Diagnosis not present

## 2024-06-03 DIAGNOSIS — I130 Hypertensive heart and chronic kidney disease with heart failure and stage 1 through stage 4 chronic kidney disease, or unspecified chronic kidney disease: Secondary | ICD-10-CM | POA: Diagnosis not present

## 2024-06-03 DIAGNOSIS — K5641 Fecal impaction: Secondary | ICD-10-CM | POA: Diagnosis not present

## 2024-06-03 DIAGNOSIS — D631 Anemia in chronic kidney disease: Secondary | ICD-10-CM | POA: Diagnosis not present

## 2024-06-03 DIAGNOSIS — H1045 Other chronic allergic conjunctivitis: Secondary | ICD-10-CM | POA: Diagnosis not present

## 2024-06-03 NOTE — Telephone Encounter (Signed)
 Ok for verbal

## 2024-06-03 NOTE — Telephone Encounter (Unsigned)
 Copied from CRM [id number]. Topic: Clinical - Home Health Verbal Orders >> Jun 03, 2024 11:58 AM Adelita BRAVO wrote: Caller/Agency: [place], PT with [facility] Number: [phone] Service Requested: Physical Therapy Frequency: 1x/week for 8 weeks Any new concerns about the patient? No

## 2024-06-04 NOTE — Telephone Encounter (Signed)
 I was able to give the verbal Ok to [place], PT with [facility], Service Requested: Physical Therapy Frequency: 1x/week for 8 weeks

## 2024-06-06 ENCOUNTER — Other Ambulatory Visit: Payer: Self-pay | Admitting: *Deleted

## 2024-06-06 DIAGNOSIS — D631 Anemia in chronic kidney disease: Secondary | ICD-10-CM | POA: Diagnosis not present

## 2024-06-06 DIAGNOSIS — I4821 Permanent atrial fibrillation: Secondary | ICD-10-CM | POA: Diagnosis not present

## 2024-06-06 DIAGNOSIS — I130 Hypertensive heart and chronic kidney disease with heart failure and stage 1 through stage 4 chronic kidney disease, or unspecified chronic kidney disease: Secondary | ICD-10-CM | POA: Diagnosis not present

## 2024-06-06 DIAGNOSIS — I5023 Acute on chronic systolic (congestive) heart failure: Secondary | ICD-10-CM | POA: Diagnosis not present

## 2024-06-06 DIAGNOSIS — H1045 Other chronic allergic conjunctivitis: Secondary | ICD-10-CM | POA: Diagnosis not present

## 2024-06-06 DIAGNOSIS — I493 Ventricular premature depolarization: Secondary | ICD-10-CM | POA: Diagnosis not present

## 2024-06-06 DIAGNOSIS — D696 Thrombocytopenia, unspecified: Secondary | ICD-10-CM | POA: Diagnosis not present

## 2024-06-06 DIAGNOSIS — N1832 Chronic kidney disease, stage 3b: Secondary | ICD-10-CM | POA: Diagnosis not present

## 2024-06-06 DIAGNOSIS — K5641 Fecal impaction: Secondary | ICD-10-CM | POA: Diagnosis not present

## 2024-06-06 NOTE — Transitions of Care (Post Inpatient/ED Visit) (Signed)
 Transition of Care week 4/ day # 22  Visit Note  06/06/2024  Name: Katelyn Jackson MRN: [id number]          DOB: 12-Apr-1946  Situation: Patient enrolled in [facility] 30-day program. Visit completed with patient's son/ caregiver [name]- verified on [facility] DPR by telephone  HIPAA identifiers x 2 verified  Background:  Recent Hospitalization for treatment of mechanical fall with extended down time- 7 hours/ CHF exacerbation; hospitalized July 4-7, 2025: declined SNF Rehab placement as recommended by hospital discharging team Multiple hospitalizations primarily for mechanical falls: (5) unplanned hospitalizations x last 12 months; (1) x last 6 months   Initial Transition Care Management Follow-up Telephone Call    Past Medical History:  Diagnosis Date   Acute lower UTI 06/19/2017   Arthritis    Breast lesion 12/07/2021   Cancer (HCC)    breast cancer   Chronic atrial fibrillation (HCC)    Chronic kidney disease    sees Dr Perri   Colonization with VRE (vancomycin -resistant enterococcus) 09/22/2021   Cramps, muscle, general    Dry skin 09/22/2021   Dyspnea on exertion    Dysrhythmia    a-fib,    ESBL E. coli carrier 09/22/2021   GERD (gastroesophageal reflux disease)    Headache    Herpes labialis 08/19/2021   Hyperlipidemia    Hypertension    Hypothyroidism    Lymphedema    Moderate to severe pulmonary hypertension (HCC)    Obesity    Osteoarthritis of right knee 08/19/2021   Personal history of chemotherapy    Personal history of radiation therapy    Pneumonia 12/2020   Presence of permanent cardiac pacemaker 01/21/2021   for bradycardia    Syncope 12/2020   needed a pacemaker   UTI (urinary tract infection) 01/05/2021   Assessment:  caregiver:  She is still doing fine.  No falls.  The home health people were here on Monday, the OT person- she said PT should also be coming out soon.  I don't have any concerns right now.  She seems to be back to her normal self     Caregiver denies clinical concerns today  Patient Reported Symptoms: Cognitive Cognitive Status: Normal speech and language skills, Alert and oriented to person, place, and time, Insightful and able to interpret abstract concepts (per son/ primary caregiver [name]) Cognitive/Intellectual Conditions Management [RPT]: None reported or documented in medical history or problem list      Neurological Neurological Review of Symptoms: No symptoms reported (per son/ primary caregiver [name])    HEENT HEENT Symptoms Reported: No symptoms reported (per son/ primary caregiver [name])      Cardiovascular Cardiovascular Symptoms Reported: No symptoms reported (per son/ primary caregiver [name]) Other Cardiovascular Symptoms: per son/ primary caregiver [name]: he continues to supervise/ monitor daily weights at home; reinforced rationale for daily weight monitoring at home along with weight gain guidelines/ action plan for weight gain; importance of taking diuretic as prescribed: son verbalizes good understanding of same without promtping Does patient have uncontrolled Hypertension?: No Cardiovascular Management Strategies: Medication therapy, Routine screening, Coping strategies, Diet modification Weight: 148 lb (67.1 kg) (home reported weight on 06/06/24)  Respiratory Respiratory Symptoms Reported: No symptoms reported Other Respiratory Symptoms: per son/ caregiver [name]: continues to report she is breathing fine, no shortness of breath Respiratory Management Strategies: Adequate rest, Coping strategies, Routine screening, Medication therapy  Endocrine Endocrine Symptoms Reported: No symptoms reported (per son/ caregiver [name]) Is patient diabetic?: No    Gastrointestinal Gastrointestinal Symptoms  Reported: No symptoms reported Additional Gastrointestinal Details: per son/ caregiver [name]: Still has real good appetite and is using the bathroom for BM's normally Gastrointestinal Management Strategies:  Coping strategies    Genitourinary Genitourinary Symptoms Reported: No symptoms reported (per son/ caregiver [name]) Additional Genitourinary Details: per son/ caregiver [name]: No problems with her peeing- she is peeing normally    Integumentary Integumentary Symptoms Reported: No symptoms reported (per son/ caregiver [name])    Musculoskeletal Musculoskelatal Symptoms Reviewed: Limited mobility Additional Musculoskeletal Details: per son/ caregiver [name]: confirmed uses assistive devices on regular basis, at baseline -- wheelchair mostly; walker occasionally;  provided reinforcement around fall prevention; caregiver reports Life-Alert system he ordered a week or so ago has still not yet arrived; confirmed no new/ recent falls post-hospital discharge on 05/13/24; confirmed home health OT and PT remain active Musculoskeletal Management Strategies: Routine screening, Coping strategies, Medical device      Psychosocial Psychosocial Symptoms Reported: No symptoms reported (per son/ caregiver [name])         There were no vitals filed for this visit.  Medications Reviewed Today     Reviewed by Logyn Kendrick M, RN (Registered Nurse) on 06/06/24 at 1102  Med List Status: <None>   Medication Order Taking? Sig Documenting Provider Last Dose Status Informant  acetaminophen  (TYLENOL ) 500 MG tablet [id number]  Take 1,000 mg by mouth daily as needed for mild pain (pain score 1-3). [provider]  Active Child, Pharmacy Records  allopurinol  (ZYLOPRIM ) 100 MG tablet [id number]  Take 0.5 tablets (50 mg total) by mouth daily. Plotnikov, [name], MD  Active Child, Pharmacy Records  apixaban  (ELIQUIS ) 5 MG TABS tablet [id number]  Take 1 tablet (5 mg total) by mouth 2 (two) times daily. Plotnikov, Aleksei V, MD  Active Child, Pharmacy Records  carvedilol  (COREG ) 12.5 MG tablet [id number]  Take 1 tablet (12.5 mg total) by mouth 2 (two) times daily with a meal. Plotnikov, [name], MD  Active  Child, Pharmacy Records  ipratropium-albuterol  (DUONEB) 0.5-2.5 (3) MG/3ML SOLN [id number]  Take 3 mLs by nebulization every 6 (six) hours as needed.  Patient not taking: Reported on 05/23/2024   Plotnikov, [name], MD  Active Child, Pharmacy Records  lactulose  (CHRONULAC ) 10 GM/15ML solution [id number]  TAKE 15 MILLILITERS BY MOUTH THREE TIMESDAILY Plotnikov, Aleksei V, MD  Active Child, Pharmacy Records  levothyroxine  (SYNTHROID ) 50 MCG tablet [id number]  Take 1 tablet (50 mcg total) by mouth daily before breakfast. Overdue for Annual appt must see provider for future refills Plotnikov, [name], MD  Active Child, Pharmacy Records  magnesium  oxide (MAG-OX) 400 MG tablet [id number]  Take 1 tablet (400 mg total) by mouth 2 (two) times daily. Plotnikov, Aleksei V, MD  Active Child, Pharmacy Records  Menthol -Zinc Oxide (CALMOSEPTINE) 0.44-20.6 % OINT [id number]  Apply topically. [provider]  Active Child, Pharmacy Records  omeprazole  [facility]) 40 MG capsule [id number]  Take 1 capsule (40 mg total) by mouth daily. Plotnikov, Aleksei V, MD  Active Child, Pharmacy Records  polyethylene glycol (MIRALAX  / GLYCOLAX ) 17 g packet [id number]  Take 17 g by mouth 2 (two) times daily. Arrien, Elidia Sieving, MD  Active Child, Pharmacy Records  promethazine -dextromethorphan  (PROMETHAZINE -DM) 6.25-15 MG/5ML syrup [id number]  Take 5 mLs by mouth 4 (four) times daily as needed for cough. Plotnikov, Aleksei V, MD  Active Child, Pharmacy Records  rifaximin  (XIFAXAN ) 550 MG TABS tablet [id number]  Take 550 mg by mouth 2 (two) times daily. [provider]  Active Child, Pharmacy Records  Med Note (WHITE, DONETA RAMAN   Fri May 10, 2024  7:53 AM) Patient received a #30 ds on 03/27/24 and would've run out by now.  spironolactone  (ALDACTONE ) 25 MG tablet [id number]  Take 1 tablet (25 mg total) by mouth daily. Restart in 3 days Fairy Frames, MD  Active   torsemide  (DEMADEX ) 20 MG tablet [id number]  Take 3  tablets (60 mg total) by mouth daily. Restart tomorrow Fairy Frames, MD  Active   traMADol  (ULTRAM ) 50 MG tablet [id number]  TAKE 1 TABLET (50 MG TOTAL) BY MOUTH EVERY 6 (SIX) HOURS AS NEEDED FOR SEVERE PAIN (PAIN SCORE 7-10). Plotnikov, Aleksei V, MD  Active   triamcinolone  cream (KENALOG ) 0.1 % [id number]  Apply 1 Application topically 2 (two) times daily. To dry patches on BLE and bilateral feet Plotnikov, [name], MD  Active Child, Pharmacy Records           Recommendation:   Continue Current Plan of Care  Follow Up Plan:   Telephone follow-up in 1 week- as scheduled 06/13/24  Pls call/ message for questions,  Kimimila Tauzin Mckinney Octavion Mollenkopf, RN, BSN, CCRN Alumnus RN Care Manager  Transitions of Care  [facility]BC[facility] - [facility] [phone]: direct office

## 2024-06-06 NOTE — Patient Instructions (Signed)
 Visit Information  Thank you for taking time to visit with me today. Please don't hesitate to contact me if I can be of assistance to you before our next scheduled telephone appointment.  Our next appointment is by telephone on Thursday June 13, 2024 at 1:00 pm  Please call the care guide team at [phone] if you need to cancel or reschedule your appointment.   Following are the goals we discussed today:  Patient Self Care Activities:  Attend all scheduled provider appointments Call provider office for new concerns or questions  Participate in Transition of Care Program/Attend TOC scheduled calls Take medications as prescribed   Use assistive devices (wheelchair/ walker) as needed to prevent falls Continue working with the home health physical therapy team that is involved in your care Weigh yourself every day to stay on top of early fluid retention: write down your weights every day so you remember what it is from day to day: follow the weight-gain guidelines and action plan to call your doctor if you gain more than 3 lbs overnight, or 5 lbs in one week Please keep in close contact with the social worker who is working with you If you believe your condition is getting worse- contact your care providers (doctors) promptly- reaching out to your doctor early when you have concerns can prevent you from having to go to the hospital  If you are experiencing a Mental Health or Behavioral Health Crisis or need someone to talk to, please call the Suicide and Crisis Lifeline: 988 call the [place]  National Suicide Prevention Lifeline: (236) 770-0640 or TTY: 845-511-9060 TTY (601) 008-2826) to talk to a trained counselor call 1-800-273-TALK (toll free, 24 hour hotline) go to [facility] [address] [place] [phone]) call the [facility] Crisis Line: [phone] call 911   Caregiver verbalizes understanding of instructions and care plan provided  today and agrees to view in MyChart. Active MyChart status and patient understanding of how to access instructions and care plan via MyChart confirmed with patient.     Karlye Ihrig Mckinney Railey Glad, RN, BSN, Media planner  Transitions of Care  VBCI - [facility] [phone]: direct office

## 2024-06-10 ENCOUNTER — Telehealth: Payer: Self-pay

## 2024-06-10 ENCOUNTER — Other Ambulatory Visit: Payer: Self-pay

## 2024-06-10 DIAGNOSIS — K5641 Fecal impaction: Secondary | ICD-10-CM | POA: Diagnosis not present

## 2024-06-10 DIAGNOSIS — N1832 Chronic kidney disease, stage 3b: Secondary | ICD-10-CM | POA: Diagnosis not present

## 2024-06-10 DIAGNOSIS — I130 Hypertensive heart and chronic kidney disease with heart failure and stage 1 through stage 4 chronic kidney disease, or unspecified chronic kidney disease: Secondary | ICD-10-CM | POA: Diagnosis not present

## 2024-06-10 DIAGNOSIS — I5023 Acute on chronic systolic (congestive) heart failure: Secondary | ICD-10-CM | POA: Diagnosis not present

## 2024-06-10 DIAGNOSIS — H1045 Other chronic allergic conjunctivitis: Secondary | ICD-10-CM | POA: Diagnosis not present

## 2024-06-10 DIAGNOSIS — I4821 Permanent atrial fibrillation: Secondary | ICD-10-CM | POA: Diagnosis not present

## 2024-06-10 DIAGNOSIS — D631 Anemia in chronic kidney disease: Secondary | ICD-10-CM | POA: Diagnosis not present

## 2024-06-10 DIAGNOSIS — I493 Ventricular premature depolarization: Secondary | ICD-10-CM | POA: Diagnosis not present

## 2024-06-10 DIAGNOSIS — D696 Thrombocytopenia, unspecified: Secondary | ICD-10-CM | POA: Diagnosis not present

## 2024-06-10 NOTE — Patient Outreach (Signed)
 LCSW spoke with patient's son to reschedule visit. LCSW explained that paperwork that was given to MD was not filled out in its entirety therefore LCSW will need to complete and then fax to [facility]. Visit was rescheduled for 06/24/2024. LCSW asked if patient's son had any other concerns and none noted at this time.  Olam Ally, MSW, LCSW [place]  [facility], [facility] Licensed Clinical Social Worker Direct Dial: [phone]

## 2024-06-12 ENCOUNTER — Telehealth: Payer: Self-pay

## 2024-06-12 NOTE — Patient Outreach (Signed)
 Per son's request LCSW called patient's insurance [employer] to ascertain if patient had any personal care assistance benefits on her plan. LCSW was informed that patient does not have personal care assistant benefit on her plan. LCSW will update son at next visit.  Olam Ally, MSW, LCSW [place]  [facility], [facility] Licensed Clinical Social Worker Direct Dial: [phone]

## 2024-06-13 ENCOUNTER — Other Ambulatory Visit: Payer: Self-pay | Admitting: *Deleted

## 2024-06-13 VITALS — Wt 149.0 lb

## 2024-06-13 DIAGNOSIS — R296 Repeated falls: Secondary | ICD-10-CM

## 2024-06-13 DIAGNOSIS — I5022 Chronic systolic (congestive) heart failure: Secondary | ICD-10-CM

## 2024-06-13 NOTE — Transitions of Care (Post Inpatient/ED Visit) (Signed)
 Transition of Care week # 5/ day #29  Visit Note  06/13/2024  Name: Katelyn Jackson MRN: [id number]          DOB: 1946-07-19  Situation: Patient enrolled in [facility] 30-day program. Visit completed with patient's son/ caregiver [name] by telephone.   HIPAA identifiers x 2 verified  Background:  Recent Hospitalization for treatment of mechanical fall with extended down time- 7 hours/ CHF exacerbation; hospitalized July 4-7, 2025: declined SNF Rehab placement as recommended by hospital discharging team Multiple hospitalizations primarily for mechanical falls: (5) unplanned hospitalizations x last 12 months; (1) x last 6 months  Initial Transition Care Management Follow-up Telephone Call    Past Medical History:  Diagnosis Date   Acute lower UTI 06/19/2017   Arthritis    Breast lesion 12/07/2021   Cancer (HCC)    breast cancer   Chronic atrial fibrillation (HCC)    Chronic kidney disease    sees Dr Perri   Colonization with VRE (vancomycin -resistant enterococcus) 09/22/2021   Cramps, muscle, general    Dry skin 09/22/2021   Dyspnea on exertion    Dysrhythmia    a-fib,    ESBL E. coli carrier 09/22/2021   GERD (gastroesophageal reflux disease)    Headache    Herpes labialis 08/19/2021   Hyperlipidemia    Hypertension    Hypothyroidism    Lymphedema    Moderate to severe pulmonary hypertension (HCC)    Obesity    Osteoarthritis of right knee 08/19/2021   Personal history of chemotherapy    Personal history of radiation therapy    Pneumonia 12/2020   Presence of permanent cardiac pacemaker 01/21/2021   for bradycardia    Syncope 12/2020   needed a pacemaker   UTI (urinary tract infection) 01/05/2021   Assessment:   She is still doing fine.  No falls.  The home health people were here on Monday, the OT person- I just have not heard from the PT people yet; I can't seem to get in touch with them; her weights are stable and she is eating good and still managing all of her  medications on her own  Caregiver denies clinical concerns today Care Coordination outreach x 3 to [facility] home health agency- [facility] [phone]: eventually spoke with clinical manager Tom: verified for him through review of EHR that PCP approved PT services on 06/04/24 for 1 time per week x 8 weeks; Tom advised that he will facilitate getting this promptly scheduled; called caregiver back and updated him accordingly  Patient Reported Symptoms: Cognitive Cognitive Status: Normal speech and language skills, Alert and oriented to person, place, and time, Insightful and able to interpret abstract concepts (per son/ caregiver [name])      Neurological Neurological Review of Symptoms: No symptoms reported (per son/ caregiver [name])    HEENT HEENT Symptoms Reported: No symptoms reported      Cardiovascular Cardiovascular Symptoms Reported: No symptoms reported Other Cardiovascular Symptoms: per son/ caregiver [name]: Continues to monitor/ record daily weights at home: continues to report consistent daily weights between 148-149 lbs; reinforced rationale for daily weight monitoring at home along with weight gain guidelines/ action plan for weight gain; importance of taking diuretic as prescribed Does patient have uncontrolled Hypertension?: No Cardiovascular Management Strategies: Medication therapy, Coping strategies, Routine screening Weight: 149 lb (67.6 kg) (home reported weight from 06/13/24- per son)  Respiratory Respiratory Symptoms Reported: No symptoms reported Other Respiratory Symptoms: per son/ caregiver [name]: she is breathing fine, no cough, no shortness  of breath that is not taken care of with a few minutes of rest; Respiratory Management Strategies: Routine screening, Coping strategies, Adequate rest, Medication therapy  Endocrine Endocrine Symptoms Reported: No symptoms reported Is patient diabetic?: No    Gastrointestinal Gastrointestinal Symptoms Reported: No  symptoms reported (per son/ caregiver [name]) Additional Gastrointestinal Details: Continues to report good appetite; having regular and normal BM's Gastrointestinal Management Strategies: Coping strategies    Genitourinary Genitourinary Symptoms Reported: No symptoms reported (per son/ caregiver [name])    Integumentary Integumentary Symptoms Reported: No symptoms reported (per son/ caregiver [name])    Musculoskeletal Musculoskelatal Symptoms Reviewed: Limited mobility Additional Musculoskeletal Details: confirmed uses assistive devices on regular basis, at baseline -- wheelchair/ walker; confirms Life Alert system expected to arrive this coming Friday Musculoskeletal Management Strategies: Routine screening, Medical device, Coping strategies      Psychosocial           There were no vitals filed for this visit.  Medications Reviewed Today     Reviewed by Adal Sereno M, RN (Registered Nurse) on 06/13/24 at 1301  Med List Status: <None>   Medication Order Taking? Sig Documenting Provider Last Dose Status Informant  acetaminophen  (TYLENOL ) 500 MG tablet [id number]  Take 1,000 mg by mouth daily as needed for mild pain (pain score 1-3). [provider]  Active Child, Pharmacy Records  allopurinol  (ZYLOPRIM ) 100 MG tablet [id number]  Take 0.5 tablets (50 mg total) by mouth daily. Plotnikov, [name], MD  Active Child, Pharmacy Records  apixaban  (ELIQUIS ) 5 MG TABS tablet [id number]  Take 1 tablet (5 mg total) by mouth 2 (two) times daily. Plotnikov, [name], MD  Active Child, Pharmacy Records  carvedilol  (COREG ) 12.5 MG tablet [id number]  Take 1 tablet (12.5 mg total) by mouth 2 (two) times daily with a meal. Plotnikov, [name], MD  Active Child, Pharmacy Records  ipratropium-albuterol  (DUONEB) 0.5-2.5 (3) MG/3ML SOLN [id number]  Take 3 mLs by nebulization every 6 (six) hours as needed.  Patient not taking: Reported on 05/23/2024   Plotnikov, [name], MD  Active Child,  Pharmacy Records  lactulose  (CHRONULAC ) 10 GM/15ML solution [id number]  TAKE 15 MILLILITERS BY MOUTH THREE TIMESDAILY Plotnikov, Aleksei V, MD  Active Child, Pharmacy Records  levothyroxine  (SYNTHROID ) 50 MCG tablet [id number]  Take 1 tablet (50 mcg total) by mouth daily before breakfast. Overdue for Annual appt must see provider for future refills Plotnikov, [name], MD  Active Child, Pharmacy Records  magnesium  oxide (MAG-OX) 400 MG tablet [id number]  Take 1 tablet (400 mg total) by mouth 2 (two) times daily. Plotnikov, Aleksei V, MD  Active Child, Pharmacy Records  Menthol -Zinc Oxide (CALMOSEPTINE) 0.44-20.6 % OINT [id number]  Apply topically. [provider]  Active Child, Pharmacy Records  omeprazole  (PRILOSEC) 40 MG capsule [id number]  Take 1 capsule (40 mg total) by mouth daily. Plotnikov, Aleksei V, MD  Active Child, Pharmacy Records  polyethylene glycol (MIRALAX  / GLYCOLAX ) 17 g packet [id number]  Take 17 g by mouth 2 (two) times daily. Arrien, Elidia Sieving, MD  Active Child, Pharmacy Records  promethazine -dextromethorphan  (PROMETHAZINE -DM) 6.25-15 MG/5ML syrup [id number]  Take 5 mLs by mouth 4 (four) times daily as needed for cough. Plotnikov, Aleksei V, MD  Active Child, Pharmacy Records  rifaximin  (XIFAXAN ) 550 MG TABS tablet [id number]  Take 550 mg by mouth 2 (two) times daily. [provider]  Active Child, Pharmacy Records           Med Note [facility], DONETA RAMAN   Fri May 10, 2024  7:53 AM) Patient received a #30 ds on 03/27/24 and would've run out by now.  spironolactone  (ALDACTONE ) 25 MG tablet [id number]  Take 1 tablet (25 mg total) by mouth daily. Restart in 3 days Fairy Frames, MD  Active   torsemide  (DEMADEX ) 20 MG tablet [id number]  Take 3 tablets (60 mg total) by mouth daily. Restart tomorrow Fairy Frames, MD  Active   traMADol  (ULTRAM ) 50 MG tablet [id number]  TAKE 1 TABLET (50 MG TOTAL) BY MOUTH EVERY 6 (SIX) HOURS AS NEEDED FOR SEVERE PAIN (PAIN SCORE 7-10).  Plotnikov, Aleksei V, MD  Active   triamcinolone  cream (KENALOG ) 0.1 % [id number]  Apply 1 Application topically 2 (two) times daily. To dry patches on BLE and bilateral feet Plotnikov, [name], MD  Active Child, Pharmacy Records           Recommendation:   Continue Current Plan of Care  Follow Up Plan:   Referral to RN Case Manager Closing From:  Transitions of Care Program  Pls call/ message for questions,  Lenord Fralix Mckinney Lakota Schweppe, RN, BSN, Media planner  Transitions of Care  [facility]BCI - [facility] [phone]: direct office

## 2024-06-13 NOTE — Patient Instructions (Signed)
 Visit Information  Thank you for taking time to visit with me today. Please don't hesitate to contact me if I can be of assistance to you before our next scheduled telephone appointment.  It has been a pleasure working with you over the last 30 days!  Please listen for a call from the scheduling care guide to schedule a phone call with the new nurse care manager who will pick up in your care where we are leaving off today  Following are the goals we discussed today:  Patient Self Care Activities:  Attend all scheduled provider appointments Call provider office for new concerns or questions  Take medications as prescribed   Use assistive devices (wheelchair/ walker) as needed to prevent falls Continue working with the home health occupational therapist, nurse, and  physical therapy team that is involved in your care Weigh yourself every day to stay on top of early fluid retention: write down your weights every day so you remember what it is from day to day: follow the weight-gain guidelines and action plan to call your doctor if you gain more than 3 lbs overnight, or 5 lbs in one week Please keep in close contact with the social worker who is working with you If you believe your condition is getting worse- contact your care providers (doctors) promptly- reaching out to your doctor early when you have concerns can prevent you from having to go to the hospital  If you are experiencing a Mental Health or Behavioral Health Crisis or need someone to talk to, please  call the Suicide and Crisis Lifeline: 988 call the [place]  National Suicide Prevention Lifeline: 959-173-2829 or TTY: (813)268-8466 TTY 320-579-0648) to talk to a trained counselor call 1-800-273-TALK (toll free, 24 hour hotline) go to [facility] [address], [place] [phone]) call the [facility]: [phone] call 911   The patient's son/ caregiver verbalized  understanding of instructions, educational materials, and care plan provided today and DECLINED offer to receive copy of patient instructions, educational materials, and care plan.   Pls call/ message for questions,  Delayne Sanzo Mckinney Alven Alverio, RN, BSN, CCRN Alumnus RN Care Manager  Transitions of Care  VBCI - [facility] [phone]: direct office

## 2024-06-14 ENCOUNTER — Telehealth: Payer: Self-pay | Admitting: *Deleted

## 2024-06-14 NOTE — Progress Notes (Signed)
 Complex Care Management Note  Care Guide Note 06/14/2024 Name: Katelyn Jackson MRN: [id number] DOB: 1946-05-09  Katelyn Jackson is a 78 y.o. year old female who sees Plotnikov, [name], MD for primary care. I reached out to son Katelyn Jackson by phone today to offer complex care management services.  [name] was given information about Complex Care Management services today including:   The Complex Care Management services include support from the care team which includes your Nurse Care Manager, Clinical Social Worker, or Pharmacist.  The Complex Care Management team is here to help remove barriers to the health concerns and goals most important to you. Complex Care Management services are voluntary, and the patient may decline or stop services at any time by request to their care team member.   Complex Care Management Consent Status: Patient agreed to services and verbal consent obtained.   Follow up plan:  Telephone appointment with complex care management team member scheduled for:  06/18/2024  Encounter Outcome:  Patient Scheduled  Thedford Franks, CMA   [facility], [facility] Guide Direct Dial: [phone]  Fax: [phone] Website: [email or web address]

## 2024-06-16 DIAGNOSIS — H1045 Other chronic allergic conjunctivitis: Secondary | ICD-10-CM | POA: Diagnosis not present

## 2024-06-16 DIAGNOSIS — D631 Anemia in chronic kidney disease: Secondary | ICD-10-CM | POA: Diagnosis not present

## 2024-06-16 DIAGNOSIS — I130 Hypertensive heart and chronic kidney disease with heart failure and stage 1 through stage 4 chronic kidney disease, or unspecified chronic kidney disease: Secondary | ICD-10-CM | POA: Diagnosis not present

## 2024-06-16 DIAGNOSIS — E039 Hypothyroidism, unspecified: Secondary | ICD-10-CM | POA: Diagnosis not present

## 2024-06-16 DIAGNOSIS — K7460 Unspecified cirrhosis of liver: Secondary | ICD-10-CM | POA: Diagnosis not present

## 2024-06-16 DIAGNOSIS — I5023 Acute on chronic systolic (congestive) heart failure: Secondary | ICD-10-CM | POA: Diagnosis not present

## 2024-06-16 DIAGNOSIS — I4821 Permanent atrial fibrillation: Secondary | ICD-10-CM | POA: Diagnosis not present

## 2024-06-16 DIAGNOSIS — S4292XD Fracture of left shoulder girdle, part unspecified, subsequent encounter for fracture with routine healing: Secondary | ICD-10-CM | POA: Diagnosis not present

## 2024-06-16 DIAGNOSIS — N1832 Chronic kidney disease, stage 3b: Secondary | ICD-10-CM | POA: Diagnosis not present

## 2024-06-17 ENCOUNTER — Telehealth: Payer: Self-pay | Admitting: Internal Medicine

## 2024-06-17 NOTE — Telephone Encounter (Signed)
 Copied from CRM [id number]. Topic: General - Other >> Jun 17, 2024  8:49 AM Jasmin G wrote: Reason for CRM: Mrs. Katelyn Jackson, nurse from [facility] stated that request for home health visits was faxed over to office last week and they're still to be returned, she seeks approval for treatment once a week for 4 weeks. Call her back if needed at [phone], it's okay to leave a message.

## 2024-06-18 ENCOUNTER — Other Ambulatory Visit: Payer: Self-pay

## 2024-06-18 ENCOUNTER — Telehealth: Payer: Self-pay

## 2024-06-18 DIAGNOSIS — D631 Anemia in chronic kidney disease: Secondary | ICD-10-CM | POA: Diagnosis not present

## 2024-06-18 DIAGNOSIS — I4821 Permanent atrial fibrillation: Secondary | ICD-10-CM | POA: Diagnosis not present

## 2024-06-18 DIAGNOSIS — I5023 Acute on chronic systolic (congestive) heart failure: Secondary | ICD-10-CM | POA: Diagnosis not present

## 2024-06-18 DIAGNOSIS — E039 Hypothyroidism, unspecified: Secondary | ICD-10-CM | POA: Diagnosis not present

## 2024-06-18 DIAGNOSIS — S4292XD Fracture of left shoulder girdle, part unspecified, subsequent encounter for fracture with routine healing: Secondary | ICD-10-CM | POA: Diagnosis not present

## 2024-06-18 DIAGNOSIS — H1045 Other chronic allergic conjunctivitis: Secondary | ICD-10-CM | POA: Diagnosis not present

## 2024-06-18 DIAGNOSIS — I130 Hypertensive heart and chronic kidney disease with heart failure and stage 1 through stage 4 chronic kidney disease, or unspecified chronic kidney disease: Secondary | ICD-10-CM | POA: Diagnosis not present

## 2024-06-18 DIAGNOSIS — K7460 Unspecified cirrhosis of liver: Secondary | ICD-10-CM | POA: Diagnosis not present

## 2024-06-18 DIAGNOSIS — N1832 Chronic kidney disease, stage 3b: Secondary | ICD-10-CM | POA: Diagnosis not present

## 2024-06-18 NOTE — Patient Instructions (Signed)
 Visit Information  Thank you for taking time to visit with me today. Please don't hesitate to contact me if I can be of assistance to you before our next scheduled appointment.  Our next appointment is by telephone on 07/02/24 at 9:30 am Please call the care guide team at [phone] if you need to cancel or reschedule your appointment.   Following is a copy of your care plan:   Goals Addressed             This Visit's Progress    VBCI RN Care Plan       Problems:  Chronic Disease Management support and education needs related to CHF Fall risk  Goal: Over the next 90 days the Patient will continue to work with Medical illustrator and/or Social Worker to address care management and care coordination needs related to CHF as evidenced by adherence to care management team scheduled appointments     verbalize understanding of plan for management of CHF as evidenced by patient/caregiver report and/or review of chart  Interventions:   Heart Failure Interventions: Basic overview and discussion of pathophysiology of Heart Failure reviewed Provided education on low sodium diet Assessed need for readable accurate scales in home Discussed the importance of keeping all appointments with provider Assessed social determinant of health barriers  Provided education via email updtodate engagement: Heart Failure: Full Program; Fall Prevention and home safety: low-sodium diet.  RNCM called [facility] re: start of care date for PT spoke with Ada who states a Physical therapist is on the schedule to call to schedule a time to see patient this week. RNCM called patient's son and notified. Also provided [facility] contact number [phone]) to call and follow up if needed.   Fall Risk Interventions: Discussed fall prevention strategies Provided education: Fall Prevention and Home safety RNCM called [facility] to follow up on physical therapy start of care date.  Confirmed [facility] scheduled to call to start care this week. Patients son notified.  Patient Self-Care Activities:  Attend all scheduled provider appointments Call pharmacy for medication refills 3-7 days in advance of running out of medications Call provider office for new concerns or questions  Take medications as prescribed   Review educational material sent via Email. Call RNCM if any questions  Plan:  Telephone follow up appointment with care management team member scheduled for:  07/02/24 at 9:30 am      Please call the Suicide and Crisis Lifeline: 988 call the [place]  National Suicide Prevention Lifeline: 714 563 7754 or TTY: (816)685-8660 TTY 848 510 3207) to talk to a trained counselor call 1-800-273-TALK (toll free, 24 hour hotline) if you are experiencing a Mental Health or Behavioral Health Crisis or need someone to talk to.  The patient verbalized understanding of instructions, educational materials, and care plan provided today and DECLINED offer to receive copy of patient instructions, educational materials, and care plan.   Heddy Shutter, RN, MSN, BSN, CCM [place]  [facility], Population Health Case Manager Phone: [phone]

## 2024-06-18 NOTE — Patient Outreach (Signed)
 Complex Care Management   Visit Note  06/18/2024  Name:  Katelyn Jackson MRN: [id number] DOB: 03/05/1946  Situation: Referral received for Complex Care Management related to Heart Failure I obtained verbal consent from [name] Velsher(son/dpr).  Visit completed with Pama Memo  on the phone  Background:   Past Medical History:  Diagnosis Date   Acute lower UTI 06/19/2017   Arthritis    Breast lesion 12/07/2021   Cancer (HCC)    breast cancer   Chronic atrial fibrillation (HCC)    Chronic kidney disease    sees Dr Perri   Colonization with VRE (vancomycin -resistant enterococcus) 09/22/2021   Cramps, muscle, general    Dry skin 09/22/2021   Dyspnea on exertion    Dysrhythmia    a-fib,    ESBL E. coli carrier 09/22/2021   GERD (gastroesophageal reflux disease)    Headache    Herpes labialis 08/19/2021   Hyperlipidemia    Hypertension    Hypothyroidism    Lymphedema    Moderate to severe pulmonary hypertension (HCC)    Obesity    Osteoarthritis of right knee 08/19/2021   Personal history of chemotherapy    Personal history of radiation therapy    Pneumonia 12/2020   Presence of permanent cardiac pacemaker 01/21/2021   for bradycardia    Syncope 12/2020   needed a pacemaker   UTI (urinary tract infection) 01/05/2021    Assessment: Per son, patient is doing well. He denies any signs/symptoms of exacerbation. Patient continues to have OT with [facility]  Patient Reported Symptoms:  Cognitive Cognitive Status:  (spoke with son who reports patient is oriented)      Neurological Neurological Review of Symptoms: No symptoms reported    HEENT HEENT Symptoms Reported: No symptoms reported      Cardiovascular Cardiovascular Symptoms Reported: No symptoms reported Other Cardiovascular Symptoms: son denies any symptoms of Heart failure weight 148. reports patient weighs every morning Does patient have uncontrolled Hypertension?: No Cardiovascular Management Strategies:  Medication therapy, Medical device Weight: 148 lb (67.1 kg) (06/18/24) Cardiovascular Self-Management Outcome: 5 (very good)  Respiratory Respiratory Symptoms Reported: No symptoms reported Other Respiratory Symptoms: per son, patient is not having any respiratory issues    Endocrine Endocrine Symptoms Reported: No symptoms reported Is patient diabetic?: No    Gastrointestinal Gastrointestinal Symptoms Reported: No symptoms reported Additional Gastrointestinal Details: son reports patient is eating well. have normal BM's      Genitourinary Genitourinary Symptoms Reported: No symptoms reported    Integumentary Integumentary Symptoms Reported: No symptoms reported    Musculoskeletal Musculoskelatal Symptoms Reviewed: Limited mobility Additional Musculoskeletal Details: reports limited mobility due to arthritis knees/shoulders takes tramodol and tylenol  as needed. active with OT at this time. Musculoskeletal Management Strategies: Medication therapy, Medical device, Adequate rest, Routine screening Musculoskeletal Self-Management Outcome: 3 (uncertain) Falls in the past year?: Yes Number of falls in past year: 2 or more Was there an injury with Fall?: Yes (broken shoulder knee) Fall Risk Category Calculator: 3 Patient Fall Risk Level: High Fall Risk Patient at Risk for Falls Due to: History of fall(s), Impaired mobility, Impaired balance/gait Fall risk Follow up: Education provided, Falls prevention discussed  Psychosocial Psychosocial Symptoms Reported: No symptoms reported (per son, no symptoms)     Quality of Family Relationships: supportive, involved Do you feel physically threatened by others?:  (spoke with son)      05/27/2024   11:24 AM  Depression screen PHQ 2/9  Decreased Interest 0  Down, Depressed, Hopeless 0  PHQ - 2 Score 0    Vitals:   06/18/24 1634  BP: 120/70  Pulse: 84    Medications Reviewed Today     Reviewed by Letesha Klecker M, RN (Registered Nurse)  on 06/18/24 at 1444  Med List Status: <None>   Medication Order Taking? Sig Documenting Provider Last Dose Status Informant  acetaminophen  (TYLENOL ) 500 MG tablet [id number] Yes Take 1,000 mg by mouth daily as needed for mild pain (pain score 1-3). [provider]  Active Child, Pharmacy Records  allopurinol  (ZYLOPRIM ) 100 MG tablet [id number] Yes Take 0.5 tablets (50 mg total) by mouth daily. Plotnikov, [name], MD  Active Child, Pharmacy Records  apixaban  (ELIQUIS ) 5 MG TABS tablet [id number] Yes Take 1 tablet (5 mg total) by mouth 2 (two) times daily. Plotnikov, Aleksei V, MD  Active Child, Pharmacy Records  carvedilol  (COREG ) 12.5 MG tablet [id number] Yes Take 1 tablet (12.5 mg total) by mouth 2 (two) times daily with a meal. Plotnikov, [name], MD  Active Child, Pharmacy Records  ipratropium-albuterol  (DUONEB) 0.5-2.5 (3) MG/3ML SOLN [id number] Yes Take 3 mLs by nebulization every 6 (six) hours as needed. Plotnikov, Aleksei V, MD  Active Child, Pharmacy Records  lactulose  (CHRONULAC ) 10 GM/15ML solution [id number] Yes TAKE 15 MILLILITERS BY MOUTH THREE TIMESDAILY Plotnikov, Aleksei V, MD  Active Child, Pharmacy Records  levothyroxine  (SYNTHROID ) 50 MCG tablet [id number] Yes Take 1 tablet (50 mcg total) by mouth daily before breakfast. Overdue for Annual appt must see provider for future refills Plotnikov, [name], MD  Active Child, Pharmacy Records  magnesium  oxide (MAG-OX) 400 MG tablet [id number] Yes Take 1 tablet (400 mg total) by mouth 2 (two) times daily. Plotnikov, Aleksei V, MD  Active Child, Pharmacy Records  Menthol -Zinc Oxide (CALMOSEPTINE) 0.44-20.6 % OINT [id number] Yes Apply topically. [provider]  Active Child, Pharmacy Records  omeprazole  [facility]) 40 MG capsule [id number] Yes Take 1 capsule (40 mg total) by mouth daily. Plotnikov, Aleksei V, MD  Active Child, Pharmacy Records  polyethylene glycol (MIRALAX  / GLYCOLAX ) 17 g packet [id number] Yes Take 17 g by mouth 2  (two) times daily. Arrien, Elidia Sieving, MD  Active Child, Pharmacy Records  promethazine -dextromethorphan  (PROMETHAZINE -DM) 6.25-15 MG/5ML syrup [id number] Yes Take 5 mLs by mouth 4 (four) times daily as needed for cough. Plotnikov, Aleksei V, MD  Active Child, Pharmacy Records  rifaximin  (XIFAXAN ) 550 MG TABS tablet [id number] Yes Take 550 mg by mouth 2 (two) times daily. [provider]  Active Child, Pharmacy Records           Med Note (WHITE, DONETA RAMAN   Fri May 10, 2024  7:53 AM) Patient received a #30 ds on 03/27/24 and would've run out by now.  spironolactone  (ALDACTONE ) 25 MG tablet [id number] Yes Take 1 tablet (25 mg total) by mouth daily. Restart in 3 days Fairy Frames, MD  Active   torsemide  (DEMADEX ) 20 MG tablet [id number] Yes Take 3 tablets (60 mg total) by mouth daily. Restart tomorrow Fairy Frames, MD  Active   traMADol  (ULTRAM ) 50 MG tablet [id number] Yes TAKE 1 TABLET (50 MG TOTAL) BY MOUTH EVERY 6 (SIX) HOURS AS NEEDED FOR SEVERE PAIN (PAIN SCORE 7-10). Plotnikov, Aleksei V, MD  Active   triamcinolone  cream (KENALOG ) 0.1 % [id number] Yes Apply 1 Application topically 2 (two) times daily. To dry patches on BLE and bilateral feet Plotnikov, Aleksei V, MD  Active Child, Pharmacy Records          Recommendation:   Specialty provider  follow-up Cardiology visit 07/04/24 and PCP on 07/04/24  Follow Up Plan:   Telephone follow up appointment date/time:  07/02/24 at 9:30 am  Heddy Shutter, RN, MSN, BSN, CCM [place]  [facility], Population Health Case Manager Phone: [phone]

## 2024-06-18 NOTE — Telephone Encounter (Signed)
 Copied from CRM [id number]. Topic: Clinical - Home Health Verbal Orders >> Jun 18, 2024 10:34 AM Harlene ORN wrote: Caller/Agency: [facility] Callback Number: [phone] Service Requested: Occupational Therapy Frequency: once a week for three weeks. Starting 06/19/2024 Any new concerns about the patient? No

## 2024-06-19 NOTE — Telephone Encounter (Signed)
 Okay. Thank you.

## 2024-06-19 NOTE — Telephone Encounter (Signed)
 Copied from CRM [id number]. Topic: Clinical - Home Health Verbal Orders >> Jun 18, 2024  4:55 PM [name] SQUIBB wrote: Caller/Agency: [facility]  Callback Number: [phone] Service Requested: Skilled Nursing Frequency: 1 week 4 Any new concerns about the patient? No

## 2024-06-19 NOTE — Telephone Encounter (Signed)
 Noted.  Will do when I get it.  Thanks

## 2024-06-20 ENCOUNTER — Telehealth: Payer: Self-pay

## 2024-06-20 DIAGNOSIS — S4292XD Fracture of left shoulder girdle, part unspecified, subsequent encounter for fracture with routine healing: Secondary | ICD-10-CM | POA: Diagnosis not present

## 2024-06-20 DIAGNOSIS — N1832 Chronic kidney disease, stage 3b: Secondary | ICD-10-CM | POA: Diagnosis not present

## 2024-06-20 DIAGNOSIS — K7460 Unspecified cirrhosis of liver: Secondary | ICD-10-CM | POA: Diagnosis not present

## 2024-06-20 DIAGNOSIS — I130 Hypertensive heart and chronic kidney disease with heart failure and stage 1 through stage 4 chronic kidney disease, or unspecified chronic kidney disease: Secondary | ICD-10-CM | POA: Diagnosis not present

## 2024-06-20 DIAGNOSIS — E039 Hypothyroidism, unspecified: Secondary | ICD-10-CM | POA: Diagnosis not present

## 2024-06-20 DIAGNOSIS — I5023 Acute on chronic systolic (congestive) heart failure: Secondary | ICD-10-CM | POA: Diagnosis not present

## 2024-06-20 DIAGNOSIS — I4821 Permanent atrial fibrillation: Secondary | ICD-10-CM | POA: Diagnosis not present

## 2024-06-20 DIAGNOSIS — D631 Anemia in chronic kidney disease: Secondary | ICD-10-CM | POA: Diagnosis not present

## 2024-06-20 DIAGNOSIS — H1045 Other chronic allergic conjunctivitis: Secondary | ICD-10-CM | POA: Diagnosis not present

## 2024-06-20 NOTE — Patient Outreach (Signed)
 LCSW called [facility] [phone] to inquire about referral that had been received. LCSW was informed that referral had been received and the next steps is that they would be calling to schedule the assessment or the patient could as well. LCSW will relay this information to patient/family.  Olam Ally, MSW, LCSW [place]  [facility], [facility] Licensed Clinical Social Worker Direct Dial: [phone]

## 2024-06-20 NOTE — Telephone Encounter (Signed)
 Okay. Thank you.

## 2024-06-20 NOTE — Telephone Encounter (Signed)
 Spoke with Charlena and was able to give the verbal Okay for Service Requested: Skilled Nursing Frequency: 1 week 4  Spoke with [name] and was able to give her the verbal ok for Service Requested: Occupational Therapy Frequency: once a week for three weeks.

## 2024-06-21 DIAGNOSIS — E039 Hypothyroidism, unspecified: Secondary | ICD-10-CM | POA: Diagnosis not present

## 2024-06-21 DIAGNOSIS — H1045 Other chronic allergic conjunctivitis: Secondary | ICD-10-CM | POA: Diagnosis not present

## 2024-06-21 DIAGNOSIS — K7460 Unspecified cirrhosis of liver: Secondary | ICD-10-CM | POA: Diagnosis not present

## 2024-06-21 DIAGNOSIS — S4292XD Fracture of left shoulder girdle, part unspecified, subsequent encounter for fracture with routine healing: Secondary | ICD-10-CM | POA: Diagnosis not present

## 2024-06-21 DIAGNOSIS — I4821 Permanent atrial fibrillation: Secondary | ICD-10-CM | POA: Diagnosis not present

## 2024-06-21 DIAGNOSIS — I130 Hypertensive heart and chronic kidney disease with heart failure and stage 1 through stage 4 chronic kidney disease, or unspecified chronic kidney disease: Secondary | ICD-10-CM | POA: Diagnosis not present

## 2024-06-21 DIAGNOSIS — K5641 Fecal impaction: Secondary | ICD-10-CM | POA: Diagnosis not present

## 2024-06-21 DIAGNOSIS — I5023 Acute on chronic systolic (congestive) heart failure: Secondary | ICD-10-CM | POA: Diagnosis not present

## 2024-06-21 DIAGNOSIS — I493 Ventricular premature depolarization: Secondary | ICD-10-CM | POA: Diagnosis not present

## 2024-06-21 DIAGNOSIS — D631 Anemia in chronic kidney disease: Secondary | ICD-10-CM | POA: Diagnosis not present

## 2024-06-21 DIAGNOSIS — N1832 Chronic kidney disease, stage 3b: Secondary | ICD-10-CM | POA: Diagnosis not present

## 2024-06-21 DIAGNOSIS — D696 Thrombocytopenia, unspecified: Secondary | ICD-10-CM | POA: Diagnosis not present

## 2024-06-24 ENCOUNTER — Other Ambulatory Visit: Payer: Self-pay

## 2024-06-24 DIAGNOSIS — D631 Anemia in chronic kidney disease: Secondary | ICD-10-CM | POA: Diagnosis not present

## 2024-06-24 DIAGNOSIS — I4821 Permanent atrial fibrillation: Secondary | ICD-10-CM | POA: Diagnosis not present

## 2024-06-24 DIAGNOSIS — K7460 Unspecified cirrhosis of liver: Secondary | ICD-10-CM | POA: Diagnosis not present

## 2024-06-24 DIAGNOSIS — S4292XD Fracture of left shoulder girdle, part unspecified, subsequent encounter for fracture with routine healing: Secondary | ICD-10-CM | POA: Diagnosis not present

## 2024-06-24 DIAGNOSIS — I130 Hypertensive heart and chronic kidney disease with heart failure and stage 1 through stage 4 chronic kidney disease, or unspecified chronic kidney disease: Secondary | ICD-10-CM | POA: Diagnosis not present

## 2024-06-24 DIAGNOSIS — E039 Hypothyroidism, unspecified: Secondary | ICD-10-CM | POA: Diagnosis not present

## 2024-06-24 DIAGNOSIS — I5023 Acute on chronic systolic (congestive) heart failure: Secondary | ICD-10-CM | POA: Diagnosis not present

## 2024-06-24 DIAGNOSIS — N1832 Chronic kidney disease, stage 3b: Secondary | ICD-10-CM | POA: Diagnosis not present

## 2024-06-24 DIAGNOSIS — H1045 Other chronic allergic conjunctivitis: Secondary | ICD-10-CM | POA: Diagnosis not present

## 2024-06-24 NOTE — Patient Outreach (Signed)
 Complex Care Management   Visit Note  06/24/2024  Name:  Katelyn Jackson MRN: [id number] DOB: 1946-04-17  Situation: Referral received for Complex Care Management related to to securing a personal care assistant for patient. I obtained verbal consent from Caregiver.  Visit completed with patient's son  on the phone. LCSW spoke with patient's son for scheduled visit. LCSW provided patient's son Pama with [facility] contact information [phone] and asked him to follow up about an assessment for patient. LCSW also inquired about HH services. Background:   Past Medical History:  Diagnosis Date   Acute lower UTI 06/19/2017   Arthritis    Breast lesion 12/07/2021   Cancer (HCC)    breast cancer   Chronic atrial fibrillation (HCC)    Chronic kidney disease    sees Dr Perri   Colonization with VRE (vancomycin -resistant enterococcus) 09/22/2021   Cramps, muscle, general    Dry skin 09/22/2021   Dyspnea on exertion    Dysrhythmia    a-fib,    ESBL E. coli carrier 09/22/2021   GERD (gastroesophageal reflux disease)    Headache    Herpes labialis 08/19/2021   Hyperlipidemia    Hypertension    Hypothyroidism    Lymphedema    Moderate to severe pulmonary hypertension (HCC)    Obesity    Osteoarthritis of right knee 08/19/2021   Personal history of chemotherapy    Personal history of radiation therapy    Pneumonia 12/2020   Presence of permanent cardiac pacemaker 01/21/2021   for bradycardia    Syncope 12/2020   needed a pacemaker   UTI (urinary tract infection) 01/05/2021    Assessment: Patient Reported Symptoms:  Cognitive Cognitive Status: No symptoms reported (spoke with son) Cognitive/Intellectual Conditions Management [RPT]: None reported or documented in medical history or problem list      Neurological Neurological Review of Symptoms: Not assessed    HEENT HEENT Symptoms Reported: Not assessed      Cardiovascular Cardiovascular Symptoms Reported: Not  assessed    Respiratory Respiratory Symptoms Reported: Not assesed    Endocrine Endocrine Symptoms Reported: Not assessed    Gastrointestinal Gastrointestinal Symptoms Reported: Not assessed      Genitourinary Genitourinary Symptoms Reported: Not assessed    Integumentary Integumentary Symptoms Reported: Not assessed    Musculoskeletal Musculoskelatal Symptoms Reviewed: Not assessed        Psychosocial Psychosocial Symptoms Reported: No symptoms reported            05/27/2024   11:24 AM  Depression screen PHQ 2/9  Decreased Interest 0  Down, Depressed, Hopeless 0  PHQ - 2 Score 0    There were no vitals filed for this visit.  Medications Reviewed Today     Reviewed by Sherren Olam FORBES KEN (Social Worker) on 06/24/24 at 1029  Med List Status: <None>   Medication Order Taking? Sig Documenting Provider Last Dose Status Informant  acetaminophen  (TYLENOL ) 500 MG tablet [id number]  Take 1,000 mg by mouth daily as needed for mild pain (pain score 1-3). [provider]  Active Child, Pharmacy Records  allopurinol  (ZYLOPRIM ) 100 MG tablet [id number]  Take 0.5 tablets (50 mg total) by mouth daily. Plotnikov, [name], MD  Active Child, Pharmacy Records  apixaban  (ELIQUIS ) 5 MG TABS tablet [id number]  Take 1 tablet (5 mg total) by mouth 2 (two) times daily. Plotnikov, [name], MD  Active Child, Pharmacy Records  carvedilol  (COREG ) 12.5 MG tablet [id number]  Take 1 tablet (12.5 mg total) by mouth 2 (two)  times daily with a meal. Plotnikov, Aleksei V, MD  Active Child, Pharmacy Records  ipratropium-albuterol  (DUONEB) 0.5-2.5 (3) MG/3ML SOLN [id number]  Take 3 mLs by nebulization every 6 (six) hours as needed. Plotnikov, Aleksei V, MD  Active Child, Pharmacy Records  lactulose  (CHRONULAC ) 10 GM/15ML solution [id number]  TAKE 15 MILLILITERS BY MOUTH THREE TIMESDAILY Plotnikov, Aleksei V, MD  Active Child, Pharmacy Records  levothyroxine  (SYNTHROID ) 50 MCG tablet [id number]  Take 1  tablet (50 mcg total) by mouth daily before breakfast. Overdue for Annual appt must see provider for future refills Plotnikov, [name], MD  Active Child, Pharmacy Records  magnesium  oxide (MAG-OX) 400 MG tablet [id number]  Take 1 tablet (400 mg total) by mouth 2 (two) times daily. Plotnikov, Aleksei V, MD  Active Child, Pharmacy Records  Menthol -Zinc Oxide (CALMOSEPTINE) 0.44-20.6 % OINT [id number]  Apply topically. [provider]  Active Child, Pharmacy Records  omeprazole  (PRILOSEC) 40 MG capsule [id number]  Take 1 capsule (40 mg total) by mouth daily. Plotnikov, Aleksei V, MD  Active Child, Pharmacy Records  polyethylene glycol (MIRALAX  / GLYCOLAX ) 17 g packet [id number]  Take 17 g by mouth 2 (two) times daily. Arrien, Elidia Sieving, MD  Active Child, Pharmacy Records  promethazine -dextromethorphan  (PROMETHAZINE -DM) 6.25-15 MG/5ML syrup [id number]  Take 5 mLs by mouth 4 (four) times daily as needed for cough. Plotnikov, Aleksei V, MD  Active Child, Pharmacy Records  rifaximin  (XIFAXAN ) 550 MG TABS tablet [id number]  Take 550 mg by mouth 2 (two) times daily. [provider]  Active Child, Pharmacy Records           Med Note (WHITE, DONETA RAMAN   Fri May 10, 2024  7:53 AM) Patient received a #30 ds on 03/27/24 and would've run out by now.  spironolactone  (ALDACTONE ) 25 MG tablet [id number]  Take 1 tablet (25 mg total) by mouth daily. Restart in 3 days Fairy Frames, MD  Active   torsemide  (DEMADEX ) 20 MG tablet [id number]  Take 3 tablets (60 mg total) by mouth daily. Restart tomorrow Fairy Frames, MD  Active   traMADol  (ULTRAM ) 50 MG tablet [id number]  TAKE 1 TABLET (50 MG TOTAL) BY MOUTH EVERY 6 (SIX) HOURS AS NEEDED FOR SEVERE PAIN (PAIN SCORE 7-10). Plotnikov, Aleksei V, MD  Active   triamcinolone  cream (KENALOG ) 0.1 % [id number]  Apply 1 Application topically 2 (two) times daily. To dry patches on BLE and bilateral feet Plotnikov, [name], MD  Active Child, Pharmacy Records             Recommendation:   Continue Current Plan of Care  Follow Up Plan:   Telephone follow-up 07/10/2024 at 10:30 am  Olam Ally, MSW, LCSW [place]  [facility], [facility] Licensed Clinical Social Worker Direct Dial: [phone]

## 2024-06-24 NOTE — Patient Instructions (Signed)
 Visit Information  Thank you for taking time to visit with me today. Please don't hesitate to contact me if I can be of assistance to you before our next scheduled appointment.  Your next care management appointment is by telephone on 07/10/2024 at 10:30 am   Please call the care guide team at [phone] if you need to cancel, schedule, or reschedule an appointment.   Please call 911 if you are experiencing a Mental Health or Behavioral Health Crisis or need someone to talk to.  Olam Ally, MSW, LCSW [place]  [facility], [facility] Licensed Clinical Social Worker Direct Dial: [phone]

## 2024-06-26 DIAGNOSIS — D631 Anemia in chronic kidney disease: Secondary | ICD-10-CM | POA: Diagnosis not present

## 2024-06-26 DIAGNOSIS — N1832 Chronic kidney disease, stage 3b: Secondary | ICD-10-CM | POA: Diagnosis not present

## 2024-06-26 DIAGNOSIS — I4821 Permanent atrial fibrillation: Secondary | ICD-10-CM | POA: Diagnosis not present

## 2024-06-26 DIAGNOSIS — E039 Hypothyroidism, unspecified: Secondary | ICD-10-CM | POA: Diagnosis not present

## 2024-06-26 DIAGNOSIS — H1045 Other chronic allergic conjunctivitis: Secondary | ICD-10-CM | POA: Diagnosis not present

## 2024-06-26 DIAGNOSIS — I5023 Acute on chronic systolic (congestive) heart failure: Secondary | ICD-10-CM | POA: Diagnosis not present

## 2024-06-26 DIAGNOSIS — K7460 Unspecified cirrhosis of liver: Secondary | ICD-10-CM | POA: Diagnosis not present

## 2024-06-26 DIAGNOSIS — S4292XD Fracture of left shoulder girdle, part unspecified, subsequent encounter for fracture with routine healing: Secondary | ICD-10-CM | POA: Diagnosis not present

## 2024-06-26 DIAGNOSIS — I130 Hypertensive heart and chronic kidney disease with heart failure and stage 1 through stage 4 chronic kidney disease, or unspecified chronic kidney disease: Secondary | ICD-10-CM | POA: Diagnosis not present

## 2024-06-28 DIAGNOSIS — N1832 Chronic kidney disease, stage 3b: Secondary | ICD-10-CM | POA: Diagnosis not present

## 2024-06-28 DIAGNOSIS — I5023 Acute on chronic systolic (congestive) heart failure: Secondary | ICD-10-CM | POA: Diagnosis not present

## 2024-06-28 DIAGNOSIS — K7460 Unspecified cirrhosis of liver: Secondary | ICD-10-CM | POA: Diagnosis not present

## 2024-06-28 DIAGNOSIS — S4292XD Fracture of left shoulder girdle, part unspecified, subsequent encounter for fracture with routine healing: Secondary | ICD-10-CM | POA: Diagnosis not present

## 2024-06-28 DIAGNOSIS — E039 Hypothyroidism, unspecified: Secondary | ICD-10-CM | POA: Diagnosis not present

## 2024-06-28 DIAGNOSIS — D631 Anemia in chronic kidney disease: Secondary | ICD-10-CM | POA: Diagnosis not present

## 2024-06-28 DIAGNOSIS — I130 Hypertensive heart and chronic kidney disease with heart failure and stage 1 through stage 4 chronic kidney disease, or unspecified chronic kidney disease: Secondary | ICD-10-CM | POA: Diagnosis not present

## 2024-06-28 DIAGNOSIS — H1045 Other chronic allergic conjunctivitis: Secondary | ICD-10-CM | POA: Diagnosis not present

## 2024-06-28 DIAGNOSIS — I4821 Permanent atrial fibrillation: Secondary | ICD-10-CM | POA: Diagnosis not present

## 2024-07-01 DIAGNOSIS — I129 Hypertensive chronic kidney disease with stage 1 through stage 4 chronic kidney disease, or unspecified chronic kidney disease: Secondary | ICD-10-CM | POA: Diagnosis not present

## 2024-07-01 DIAGNOSIS — N1832 Chronic kidney disease, stage 3b: Secondary | ICD-10-CM | POA: Diagnosis not present

## 2024-07-01 DIAGNOSIS — R351 Nocturia: Secondary | ICD-10-CM | POA: Diagnosis not present

## 2024-07-01 DIAGNOSIS — M109 Gout, unspecified: Secondary | ICD-10-CM | POA: Diagnosis not present

## 2024-07-01 DIAGNOSIS — D649 Anemia, unspecified: Secondary | ICD-10-CM | POA: Diagnosis not present

## 2024-07-01 DIAGNOSIS — R600 Localized edema: Secondary | ICD-10-CM | POA: Diagnosis not present

## 2024-07-01 LAB — LAB REPORT - SCANNED: EGFR: 45

## 2024-07-02 ENCOUNTER — Encounter: Payer: Self-pay | Admitting: Internal Medicine

## 2024-07-02 ENCOUNTER — Telehealth: Payer: Self-pay

## 2024-07-02 DIAGNOSIS — E039 Hypothyroidism, unspecified: Secondary | ICD-10-CM | POA: Diagnosis not present

## 2024-07-02 DIAGNOSIS — H1045 Other chronic allergic conjunctivitis: Secondary | ICD-10-CM | POA: Diagnosis not present

## 2024-07-02 DIAGNOSIS — I130 Hypertensive heart and chronic kidney disease with heart failure and stage 1 through stage 4 chronic kidney disease, or unspecified chronic kidney disease: Secondary | ICD-10-CM | POA: Diagnosis not present

## 2024-07-02 DIAGNOSIS — D631 Anemia in chronic kidney disease: Secondary | ICD-10-CM | POA: Diagnosis not present

## 2024-07-02 DIAGNOSIS — S4292XD Fracture of left shoulder girdle, part unspecified, subsequent encounter for fracture with routine healing: Secondary | ICD-10-CM | POA: Diagnosis not present

## 2024-07-02 DIAGNOSIS — I4821 Permanent atrial fibrillation: Secondary | ICD-10-CM | POA: Diagnosis not present

## 2024-07-02 DIAGNOSIS — K7460 Unspecified cirrhosis of liver: Secondary | ICD-10-CM | POA: Diagnosis not present

## 2024-07-02 DIAGNOSIS — N1832 Chronic kidney disease, stage 3b: Secondary | ICD-10-CM | POA: Diagnosis not present

## 2024-07-02 DIAGNOSIS — I5023 Acute on chronic systolic (congestive) heart failure: Secondary | ICD-10-CM | POA: Diagnosis not present

## 2024-07-02 NOTE — Patient Instructions (Signed)
 Katelyn Jackson - I am sorry I was unable to reach you today for our scheduled appointment. I work with Plotnikov, [name], MD and am calling to support your healthcare needs. Please contact me at [phone] at your earliest convenience. I look forward to speaking with you soon.   Thank you,   Heddy Shutter, RN, MSN, BSN, CCM [place]  [facility], Population Health Case Manager Phone: [phone]

## 2024-07-03 DIAGNOSIS — H1045 Other chronic allergic conjunctivitis: Secondary | ICD-10-CM | POA: Diagnosis not present

## 2024-07-03 DIAGNOSIS — I130 Hypertensive heart and chronic kidney disease with heart failure and stage 1 through stage 4 chronic kidney disease, or unspecified chronic kidney disease: Secondary | ICD-10-CM | POA: Diagnosis not present

## 2024-07-03 DIAGNOSIS — D631 Anemia in chronic kidney disease: Secondary | ICD-10-CM | POA: Diagnosis not present

## 2024-07-03 DIAGNOSIS — S4292XD Fracture of left shoulder girdle, part unspecified, subsequent encounter for fracture with routine healing: Secondary | ICD-10-CM | POA: Diagnosis not present

## 2024-07-03 DIAGNOSIS — N1832 Chronic kidney disease, stage 3b: Secondary | ICD-10-CM | POA: Diagnosis not present

## 2024-07-03 DIAGNOSIS — K7460 Unspecified cirrhosis of liver: Secondary | ICD-10-CM | POA: Diagnosis not present

## 2024-07-03 DIAGNOSIS — I5023 Acute on chronic systolic (congestive) heart failure: Secondary | ICD-10-CM | POA: Diagnosis not present

## 2024-07-03 DIAGNOSIS — I4821 Permanent atrial fibrillation: Secondary | ICD-10-CM | POA: Diagnosis not present

## 2024-07-03 DIAGNOSIS — E039 Hypothyroidism, unspecified: Secondary | ICD-10-CM | POA: Diagnosis not present

## 2024-07-04 ENCOUNTER — Encounter: Payer: Self-pay | Admitting: Internal Medicine

## 2024-07-04 ENCOUNTER — Ambulatory Visit: Admitting: Internal Medicine

## 2024-07-04 VITALS — Ht 60.0 in

## 2024-07-04 DIAGNOSIS — R531 Weakness: Secondary | ICD-10-CM

## 2024-07-04 DIAGNOSIS — R8290 Unspecified abnormal findings in urine: Secondary | ICD-10-CM | POA: Diagnosis not present

## 2024-07-04 DIAGNOSIS — N3281 Overactive bladder: Secondary | ICD-10-CM

## 2024-07-04 DIAGNOSIS — Z950 Presence of cardiac pacemaker: Secondary | ICD-10-CM | POA: Diagnosis not present

## 2024-07-04 DIAGNOSIS — K7460 Unspecified cirrhosis of liver: Secondary | ICD-10-CM

## 2024-07-04 DIAGNOSIS — N1832 Chronic kidney disease, stage 3b: Secondary | ICD-10-CM | POA: Diagnosis not present

## 2024-07-04 MED ORDER — RIFAXIMIN 550 MG PO TABS: 550.0000 mg | ORAL_TABLET | Freq: Two times a day (BID) | ORAL | 1 refills | Status: AC

## 2024-07-04 MED ORDER — SENNA-DOCUSATE SODIUM 8.6-50 MG PO TABS: 2.0000 | ORAL_TABLET | Freq: Two times a day (BID) | ORAL | 3 refills | Status: AC

## 2024-07-04 MED ORDER — LACTULOSE 10 GM/15ML PO SOLN: ORAL | 3 refills | Status: AC

## 2024-07-04 MED ORDER — TRAMADOL HCL 50 MG PO TABS: 50.0000 mg | ORAL_TABLET | Freq: Four times a day (QID) | ORAL | 2 refills | Status: DC | PRN

## 2024-07-04 MED ORDER — LEVOTHYROXINE SODIUM 50 MCG PO TABS: 50.0000 ug | ORAL_TABLET | Freq: Every day | ORAL | 3 refills | Status: AC

## 2024-07-04 MED ORDER — OMEPRAZOLE 40 MG PO CPDR: 40.0000 mg | DELAYED_RELEASE_CAPSULE | Freq: Every day | ORAL | 3 refills | Status: AC

## 2024-07-04 MED ORDER — TOLTERODINE TARTRATE ER 4 MG PO CP24: 4.0000 mg | ORAL_CAPSULE | Freq: Every day | ORAL | 1 refills | Status: AC

## 2024-07-04 MED ORDER — TRAMADOL HCL 50 MG PO TABS: 50.0000 mg | ORAL_TABLET | Freq: Four times a day (QID) | ORAL | 2 refills | Status: AC | PRN

## 2024-07-04 NOTE — Progress Notes (Signed)
 Subjective:  Patient ID: Katelyn Jackson, female    DOB: Jan 01, 1946  Age: 78 y.o. MRN: [id number]  CC: Medical Management of Chronic Issues (6 week f/u )   HPI [name] presents for UTI, frequency - can't sleep The patient is here with her son  Outpatient Medications Prior to Visit  Medication Sig Dispense Refill   acetaminophen  (TYLENOL ) 500 MG tablet Take 1,000 mg by mouth daily as needed for mild pain (pain score 1-3).     allopurinol  (ZYLOPRIM ) 100 MG tablet Take 0.5 tablets (50 mg total) by mouth daily. 90 tablet 1   apixaban  (ELIQUIS ) 5 MG TABS tablet Take 1 tablet (5 mg total) by mouth 2 (two) times daily. 180 tablet 3   carvedilol  (COREG ) 12.5 MG tablet Take 1 tablet (12.5 mg total) by mouth 2 (two) times daily with a meal. 180 tablet 3   cephALEXin  (KEFLEX ) 250 MG capsule Take by mouth.     ipratropium-albuterol  (DUONEB) 0.5-2.5 (3) MG/3ML SOLN Take 3 mLs by nebulization every 6 (six) hours as needed. 270 mL 3   magnesium  oxide (MAG-OX) 400 MG tablet Take 1 tablet (400 mg total) by mouth 2 (two) times daily. 180 tablet 3   Menthol -Zinc Oxide (CALMOSEPTINE) 0.44-20.6 % OINT Apply topically.     polyethylene glycol (MIRALAX  / GLYCOLAX ) 17 g packet Take 17 g by mouth 2 (two) times daily. 14 each 0   promethazine -dextromethorphan  (PROMETHAZINE -DM) 6.25-15 MG/5ML syrup Take 5 mLs by mouth 4 (four) times daily as needed for cough. 240 mL 1   spironolactone  (ALDACTONE ) 25 MG tablet Take 1 tablet (25 mg total) by mouth daily. Restart in 3 days     torsemide  (DEMADEX ) 20 MG tablet Take 3 tablets (60 mg total) by mouth daily. Restart tomorrow     triamcinolone  cream (KENALOG ) 0.1 % Apply 1 Application topically 2 (two) times daily. To dry patches on BLE and bilateral feet 450 g 3   lactulose  (CHRONULAC ) 10 GM/15ML solution TAKE 15 MILLILITERS BY MOUTH THREE TIMESDAILY 946 mL 3   levothyroxine  (SYNTHROID ) 50 MCG tablet Take 1 tablet (50 mcg total) by mouth daily before breakfast.  Overdue for Annual appt must see provider for future refills 90 tablet 3   omeprazole  (PRILOSEC) 40 MG capsule Take 1 capsule (40 mg total) by mouth daily. 90 capsule 3   rifaximin  (XIFAXAN ) 550 MG TABS tablet Take 550 mg by mouth 2 (two) times daily.     traMADol  (ULTRAM ) 50 MG tablet TAKE 1 TABLET (50 MG TOTAL) BY MOUTH EVERY 6 (SIX) HOURS AS NEEDED FOR SEVERE PAIN (PAIN SCORE 7-10). 120 tablet 2   No facility-administered medications prior to visit.    ROS: Review of Systems  Constitutional:  Negative for activity change, appetite change, chills, fatigue and unexpected weight change.  HENT:  Negative for congestion, mouth sores and sinus pressure.   Eyes:  Negative for visual disturbance.  Respiratory:  Negative for cough and chest tightness.   Gastrointestinal:  Negative for abdominal pain and nausea.  Genitourinary:  Negative for difficulty urinating, frequency and vaginal pain.  Musculoskeletal:  Negative for back pain and gait problem.  Skin:  Negative for pallor and rash.  Neurological:  Negative for dizziness, tremors, weakness, numbness and headaches.  Psychiatric/Behavioral:  Negative for confusion and sleep disturbance.     Objective:  Ht 5' (1.524 m)   LMP  (LMP Unknown)   BMI 28.90 kg/m   BP Readings from Last 3 Encounters:  06/18/24 120/70  05/23/24  110/68  05/13/24 (!) 145/75    Wt Readings from Last 3 Encounters:  06/18/24 148 lb (67.1 kg)  06/13/24 149 lb (67.6 kg)  06/06/24 148 lb (67.1 kg)    Physical Exam Constitutional:      General: She is not in acute distress.    Appearance: She is well-developed.  HENT:     Head: Normocephalic.     Right Ear: External ear normal.     Left Ear: External ear normal.     Nose: Nose normal.  Eyes:     General:        Right eye: No discharge.        Left eye: No discharge.     Conjunctiva/sclera: Conjunctivae normal.     Pupils: Pupils are equal, round, and reactive to light.  Neck:     Thyroid : No  thyromegaly.     Vascular: No JVD.     Trachea: No tracheal deviation.  Cardiovascular:     Rate and Rhythm: Normal rate and regular rhythm.     Heart sounds: Normal heart sounds.  Pulmonary:     Effort: No respiratory distress.     Breath sounds: No stridor. No wheezing.  Abdominal:     General: Bowel sounds are normal. There is no distension.     Palpations: Abdomen is soft. There is no mass.     Tenderness: There is no abdominal tenderness. There is no guarding or rebound.  Musculoskeletal:        General: No tenderness.     Cervical back: Normal range of motion and neck supple. No rigidity.  Lymphadenopathy:     Cervical: No cervical adenopathy.  Skin:    Findings: No erythema or rash.  Neurological:     Cranial Nerves: No cranial nerve deficit.     Motor: No abnormal muscle tone.     Coordination: Coordination normal.     Deep Tendon Reflexes: Reflexes normal.  Psychiatric:        Behavior: Behavior normal.        Thought Content: Thought content normal.        Judgment: Judgment normal.    In a wheelchair. Appears chronically ill Morbidly obese with lower extremity lymphedema   Lab Results  Component Value Date   WBC 4.7 05/10/2024   HGB 10.7 (L) 05/10/2024   HCT 34.8 (L) 05/10/2024   PLT 131 (L) 05/10/2024   GLUCOSE 114 (H) 05/13/2024   CHOL 151 08/21/2023   TRIG 67 08/21/2023   HDL 63 08/21/2023   LDLCALC 75 08/21/2023   ALT 16 05/09/2024   AST 19 05/09/2024   NA 140 05/13/2024   K 4.0 05/13/2024   CL 102 05/13/2024   CREATININE 2.27 (H) 05/13/2024   BUN 30 (H) 05/13/2024   CO2 30 05/13/2024   TSH 3.16 02/12/2024   INR 1.4 (H) 08/20/2023   HGBA1C 4.8 02/12/2024    DG Shoulder Left Result Date: 05/10/2024 CLINICAL DATA:  Unwitnessed fall 7 hours ago.  Shoulder pain. EXAM: LEFT SHOULDER - 2+ VIEW COMPARISON:  None Available. FINDINGS: No evidence for an acute fracture. Acromioclavicular joint not well demonstrated on the current study due to  positioning. Degenerative changes are noted in the glenohumeral joint. No evidence for shoulder dislocation. Apparent narrowing of the acromial humeral distance suggests chronic rotator cuff pathology. IMPRESSION: Study limited by osteopenia and positioning. While no gross fracture or dislocation evident, CT or MR imaging could be used to further evaluate as clinically warranted.  Electronically Signed   By: Camellia Candle M.D.   On: 05/10/2024 12:08   CT Shoulder Right Wo Contrast Result Date: 05/09/2024 CLINICAL DATA:  Right shoulder injury from fall. EXAM: CT OF THE UPPER RIGHT EXTREMITY WITHOUT CONTRAST TECHNIQUE: Multidetector CT imaging of the upper right extremity was performed according to the standard protocol. RADIATION DOSE REDUCTION: This exam was performed according to the departmental dose-optimization program which includes automated exposure control, adjustment of the mA and/or kV according to patient size and/or use of iterative reconstruction technique. COMPARISON:  Same day radiograph FINDINGS: Bones/Joint/Cartilage The humeral head is not visualized, presumed postsurgical. Age indeterminate anterior dislocation of the humeral diaphysis. Age indeterminate fracture of the inferior glenoid (series 7/image 61). Advanced degenerative changes about the glenoid with moderate effusion. Ligaments Suboptimally assessed by CT. Muscles and Tendons Diffuse rotator cuff and deltoid atrophy. Soft tissues Interlobular septal thickening in the right lung suspicious for interstitial edema. Small right pleural effusion. IMPRESSION: 1. Age indeterminate anterior dislocation of the humeral diaphysis. Postsurgical absence of the humeral head. 2. Age indeterminate fracture of the inferior glenoid. 3. Advanced degenerative changes about the glenoid with moderate effusion. 4. Interlobular septal thickening in the right lung suspicious for interstitial edema. Small right pleural effusion. Electronically Signed   By: Norman Gatlin M.D.   On: 05/09/2024 19:39   CT Head Wo Contrast Result Date: 05/09/2024 CLINICAL DATA:  Head trauma, fall EXAM: CT HEAD WITHOUT CONTRAST CT CERVICAL SPINE WITHOUT CONTRAST TECHNIQUE: Multidetector CT imaging of the head and cervical spine was performed following the standard protocol without intravenous contrast. Multiplanar CT image reconstructions of the cervical spine were also generated. RADIATION DOSE REDUCTION: This exam was performed according to the departmental dose-optimization program which includes automated exposure control, adjustment of the mA and/or kV according to patient size and/or use of iterative reconstruction technique. COMPARISON:  08/20/2023 FINDINGS: CT HEAD FINDINGS Brain: No evidence of acute infarction, hemorrhage, hydrocephalus, extra-axial collection or mass lesion/mass effect. Periventricular white matter hypodensity. Vascular: No hyperdense vessel or unexpected calcification. Skull: Normal. Negative for fracture or focal lesion. Sinuses/Orbits: No acute finding. Other: None. CT CERVICAL SPINE FINDINGS Alignment: Degenerative straightening of the normal cervical lordosis. Skull base and vertebrae: No acute fracture. No primary bone lesion or focal pathologic process. Soft tissues and spinal canal: No prevertebral fluid or swelling. No visible canal hematoma. Disc levels: Moderate to severe multilevel cervical disc degenerative disease from C3-C7. Upper chest: Negative. Other: None. IMPRESSION: 1. No acute intracranial pathology. Small-vessel white matter disease. 2. No fracture or static subluxation of the cervical spine. 3. Moderate to severe multilevel cervical disc degenerative disease from C3-C7. Electronically Signed   By: Marolyn JONETTA Jaksch M.D.   On: 05/09/2024 19:08   CT Cervical Spine Wo Contrast Result Date: 05/09/2024 CLINICAL DATA:  Head trauma, fall EXAM: CT HEAD WITHOUT CONTRAST CT CERVICAL SPINE WITHOUT CONTRAST TECHNIQUE: Multidetector CT imaging of the head  and cervical spine was performed following the standard protocol without intravenous contrast. Multiplanar CT image reconstructions of the cervical spine were also generated. RADIATION DOSE REDUCTION: This exam was performed according to the departmental dose-optimization program which includes automated exposure control, adjustment of the mA and/or kV according to patient size and/or use of iterative reconstruction technique. COMPARISON:  08/20/2023 FINDINGS: CT HEAD FINDINGS Brain: No evidence of acute infarction, hemorrhage, hydrocephalus, extra-axial collection or mass lesion/mass effect. Periventricular white matter hypodensity. Vascular: No hyperdense vessel or unexpected calcification. Skull: Normal. Negative for fracture or focal lesion. Sinuses/Orbits: No  acute finding. Other: None. CT CERVICAL SPINE FINDINGS Alignment: Degenerative straightening of the normal cervical lordosis. Skull base and vertebrae: No acute fracture. No primary bone lesion or focal pathologic process. Soft tissues and spinal canal: No prevertebral fluid or swelling. No visible canal hematoma. Disc levels: Moderate to severe multilevel cervical disc degenerative disease from C3-C7. Upper chest: Negative. Other: None. IMPRESSION: 1. No acute intracranial pathology. Small-vessel white matter disease. 2. No fracture or static subluxation of the cervical spine. 3. Moderate to severe multilevel cervical disc degenerative disease from C3-C7. Electronically Signed   By: Marolyn JONETTA Jaksch M.D.   On: 05/09/2024 19:08   DG Knee Complete 4 Views Left Result Date: 05/09/2024 CLINICAL DATA:  Knee pain.  Fall. EXAM: LEFT KNEE - COMPLETE 4+ VIEW COMPARISON:  01/24/2024 FINDINGS: No acute fracture or dislocation. Chronic advanced tricompartmental osteoarthritis with joint space narrowing, spurring and articular irregularity. Chronic ununited patellar fracture. No significant joint effusion. Generalized soft tissue edema. IMPRESSION: 1. No acute fracture  or dislocation. 2. Chronic advanced tricompartmental osteoarthritis. 3. Chronic ununited patellar fracture. Electronically Signed   By: Andrea Gasman M.D.   On: 05/09/2024 18:39   DG Knee Complete 4 Views Right Result Date: 05/09/2024 CLINICAL DATA:  Knee pain.  Fall. EXAM: RIGHT KNEE - COMPLETE 4+ VIEW COMPARISON:  08/20/2023 FINDINGS: No acute fracture or dislocation. Tricompartmental osteoarthritis with joint space narrowing, spurring and articular irregularity. No significant joint effusion. No erosive change. Generalized soft tissue edema. IMPRESSION: 1. No acute fracture or dislocation of the right knee. 2. Advanced tricompartmental osteoarthritis. Electronically Signed   By: Andrea Gasman M.D.   On: 05/09/2024 18:38   DG Shoulder Right Result Date: 05/09/2024 CLINICAL DATA:  Pain after fall. EXAM: RIGHT SHOULDER - 2+ VIEW COMPARISON:  Shoulder radiograph 07/26/2021. Previous chest radiographs reviewed. FINDINGS: Chronic deformity of the right proximal humerus with absence of the humeral head, possibly postsurgical. There is no evidence of acute fracture. Chronic widening of the acromioclavicular joint. IMPRESSION: 1. No acute fracture of the right shoulder. 2. Chronic deformity of the right proximal humerus with absence of the humeral head, possibly postsurgical. Electronically Signed   By: Andrea Gasman M.D.   On: 05/09/2024 18:35   DG Pelvis Portable Result Date: 05/09/2024 CLINICAL DATA:  Pain after fall. EXAM: PORTABLE PELVIS 1-2 VIEWS COMPARISON:  08/20/2023 FINDINGS: The bones are subjectively under mineralized. The cortical margins of the bony pelvis are intact. No fracture. Pubic symphysis and sacroiliac joints are congruent. Both femoral heads are well-seated in the respective acetabula. IMPRESSION: No pelvic fracture. Electronically Signed   By: Andrea Gasman M.D.   On: 05/09/2024 18:34   DG Chest Portable 1 View Result Date: 05/09/2024 CLINICAL DATA:  Pain after fall. EXAM:  PORTABLE CHEST 1 VIEW COMPARISON:  11/24/2023 FINDINGS: Left-sided pacemaker remains in place. Cardiomegaly is stable. Mediastinal contours are unchanged. Diffuse peribronchial and interstitial thickening suspicious for pulmonary edema. No pneumothorax. No significant pleural effusion. Chronic right proximal humeral deformity. Chronic left shoulder arthropathy. On limited assessment, no acute osseous findings. IMPRESSION: Cardiomegaly with diffuse peribronchial and interstitial thickening suspicious for pulmonary edema. Electronically Signed   By: Andrea Gasman M.D.   On: 05/09/2024 18:26    Assessment & Plan:   Problem List Items Addressed This Visit     Abnormal urinalysis   CKD stage 3b, GFR 30-44 ml/min (HCC) (Chronic)   Monitoring GFR Hydrate well      Liver cirrhosis (HCC)   Continue on torsemide , Aldactone , Xifaxan  Monitor LFTs,  ammonia      OAB (overactive bladder) - Primary   Multifactorial.  Will try Detrol  LA Do not take diuretic late at night        Weakness   Discussed PCS form was filled out previously         Meds ordered this encounter  Medications   rifaximin  (XIFAXAN ) 550 MG TABS tablet    Sig: Take 1 tablet (550 mg total) by mouth 2 (two) times daily.    Dispense:  42 tablet    Refill:  1   omeprazole  (PRILOSEC) 40 MG capsule    Sig: Take 1 capsule (40 mg total) by mouth daily.    Dispense:  90 capsule    Refill:  3   DISCONTD: traMADol  (ULTRAM ) 50 MG tablet    Sig: Take 1 tablet (50 mg total) by mouth every 6 (six) hours as needed for severe pain (pain score 7-10).    Dispense:  120 tablet    Refill:  2   levothyroxine  (SYNTHROID ) 50 MCG tablet    Sig: Take 1 tablet (50 mcg total) by mouth daily before breakfast. Overdue for Annual appt must see provider for future refills    Dispense:  90 tablet    Refill:  3   lactulose  (CHRONULAC ) 10 GM/15ML solution    Sig: TAKE 15 MILLILITERS BY MOUTH THREE TIMESDAILY    Dispense:  946 mL    Refill:  3    sennosides-docusate sodium  (SENOKOT-S) 8.6-50 MG tablet    Sig: Take 2 tablets by mouth every 12 (twelve) hours.    Dispense:  120 tablet    Refill:  3   tolterodine  (DETROL  LA) 4 MG 24 hr capsule    Sig: Take 1 capsule (4 mg total) by mouth daily.    Dispense:  90 capsule    Refill:  1   traMADol  (ULTRAM ) 50 MG tablet    Sig: Take 1 tablet (50 mg total) by mouth every 6 (six) hours as needed for severe pain (pain score 7-10).    Dispense:  120 tablet    Refill:  2      Follow-up: Return in about 3 months (around 10/04/2024) for a follow-up visit.  Marolyn Noel, MD

## 2024-07-04 NOTE — Assessment & Plan Note (Addendum)
 Multifactorial.  Will try Detrol  LA Do not take diuretic late at night

## 2024-07-07 NOTE — Assessment & Plan Note (Signed)
Monitoring GFR Hydrate well

## 2024-07-07 NOTE — Assessment & Plan Note (Signed)
 Discussed PCS form was filled out previously

## 2024-07-07 NOTE — Assessment & Plan Note (Signed)
 Continue on torsemide , Aldactone , Xifaxan  Monitor LFTs, ammonia

## 2024-07-10 ENCOUNTER — Other Ambulatory Visit: Payer: Self-pay

## 2024-07-10 DIAGNOSIS — I4821 Permanent atrial fibrillation: Secondary | ICD-10-CM | POA: Diagnosis not present

## 2024-07-10 DIAGNOSIS — H1045 Other chronic allergic conjunctivitis: Secondary | ICD-10-CM | POA: Diagnosis not present

## 2024-07-10 DIAGNOSIS — S4292XD Fracture of left shoulder girdle, part unspecified, subsequent encounter for fracture with routine healing: Secondary | ICD-10-CM | POA: Diagnosis not present

## 2024-07-10 DIAGNOSIS — K7460 Unspecified cirrhosis of liver: Secondary | ICD-10-CM | POA: Diagnosis not present

## 2024-07-10 DIAGNOSIS — I130 Hypertensive heart and chronic kidney disease with heart failure and stage 1 through stage 4 chronic kidney disease, or unspecified chronic kidney disease: Secondary | ICD-10-CM | POA: Diagnosis not present

## 2024-07-10 DIAGNOSIS — D631 Anemia in chronic kidney disease: Secondary | ICD-10-CM | POA: Diagnosis not present

## 2024-07-10 DIAGNOSIS — I5023 Acute on chronic systolic (congestive) heart failure: Secondary | ICD-10-CM | POA: Diagnosis not present

## 2024-07-10 DIAGNOSIS — E039 Hypothyroidism, unspecified: Secondary | ICD-10-CM | POA: Diagnosis not present

## 2024-07-10 DIAGNOSIS — N1832 Chronic kidney disease, stage 3b: Secondary | ICD-10-CM | POA: Diagnosis not present

## 2024-07-10 NOTE — Patient Outreach (Signed)
 Complex Care Management   Visit Note  07/10/2024  Name:  Katelyn Jackson MRN: [id number] DOB: [birth year]  Situation: Referral received for Complex Care Management related to to securing a personal care assistant for patient. I obtained verbal consent from Caregiver.  Visit completed with patient's son  on the phone. LCSW spoke with patient's son for scheduled visit. LCSW provided patient's son Pama with [facility] contact information [phone] and asked him to follow up about an assessment for patient. LCSW also inquired about HH services.   Background:   Past Medical History:  Diagnosis Date   Acute lower UTI 06/19/2017   Arthritis    Breast lesion 12/07/2021   Cancer (HCC)    breast cancer   Chronic atrial fibrillation (HCC)    Chronic kidney disease    sees Dr Perri   Colonization with VRE (vancomycin -resistant enterococcus) 09/22/2021   Cramps, muscle, general    Dry skin 09/22/2021   Dyspnea on exertion    Dysrhythmia    a-fib,    ESBL E. coli carrier 09/22/2021   GERD (gastroesophageal reflux disease)    Headache    Herpes labialis 08/19/2021   Hyperlipidemia    Hypertension    Hypothyroidism    Lymphedema    Moderate to severe pulmonary hypertension (HCC)    Obesity    Osteoarthritis of right knee 08/19/2021   Personal history of chemotherapy    Personal history of radiation therapy    Pneumonia 12/2020   Presence of permanent cardiac pacemaker 01/21/2021   for bradycardia    Syncope 12/2020   needed a pacemaker   UTI (urinary tract infection) 01/05/2021    Assessment: Patient Reported Symptoms:  Cognitive Cognitive Status: No symptoms reported Cognitive/Intellectual Conditions Management [RPT]: None reported or documented in medical history or problem list      Neurological Neurological Review of Symptoms: Not assessed    HEENT HEENT Symptoms Reported: Not assessed      Cardiovascular Cardiovascular Symptoms Reported: Not assessed     Respiratory Respiratory Symptoms Reported: Not assesed    Endocrine Endocrine Symptoms Reported: Not assessed    Gastrointestinal Gastrointestinal Symptoms Reported: Not assessed      Genitourinary Genitourinary Symptoms Reported: Not assessed    Integumentary Integumentary Symptoms Reported: Not assessed    Musculoskeletal Musculoskelatal Symptoms Reviewed: Not assessed        Psychosocial Psychosocial Symptoms Reported: Not assessed          07/10/2024    PHQ2-9 Depression Screening   Little interest or pleasure in doing things    Feeling down, depressed, or hopeless    PHQ-2 - Total Score    Trouble falling or staying asleep, or sleeping too much    Feeling tired or having little energy    Poor appetite or overeating     Feeling bad about yourself - or that you are a failure or have let yourself or your family down    Trouble concentrating on things, such as reading the newspaper or watching television    Moving or speaking so slowly that other people could have noticed.  Or the opposite - being so fidgety or restless that you have been moving around a lot more than usual    Thoughts that you would be better off dead, or hurting yourself in some way    PHQ2-9 Total Score    If you checked off any problems, how difficult have these problems made it for you to do your work, take care of things  at home, or get along with other people    Depression Interventions/Treatment      There were no vitals filed for this visit.  Medications Reviewed Today     Reviewed by Sherren Olam FORBES KEN (Social Worker) on 07/10/24 at 1028  Med List Status: <None>   Medication Order Taking? Sig Documenting Provider Last Dose Status Informant  acetaminophen  (TYLENOL ) 500 MG tablet [id number]  Take 1,000 mg by mouth daily as needed for mild pain (pain score 1-3). [provider]  Active Child, Pharmacy Records  allopurinol  (ZYLOPRIM ) 100 MG tablet [id number]  Take 0.5 tablets (50 mg  total) by mouth daily. Plotnikov, [name], MD  Active Child, Pharmacy Records  apixaban  (ELIQUIS ) 5 MG TABS tablet [id number]  Take 1 tablet (5 mg total) by mouth 2 (two) times daily. Plotnikov, [name], MD  Active Child, Pharmacy Records  carvedilol  (COREG ) 12.5 MG tablet [id number]  Take 1 tablet (12.5 mg total) by mouth 2 (two) times daily with a meal. Plotnikov, [name], MD  Active Child, Pharmacy Records  cephALEXin  (KEFLEX ) 250 MG capsule [id number]  Take by mouth. [provider]  Active   ipratropium-albuterol  (DUONEB) 0.5-2.5 (3) MG/3ML SOLN [id number]  Take 3 mLs by nebulization every 6 (six) hours as needed. Plotnikov, Aleksei V, MD  Active Child, Pharmacy Records  lactulose  (CHRONULAC ) 10 GM/15ML solution [id number]  TAKE 15 MILLILITERS BY MOUTH THREE TIMESDAILY Plotnikov, Aleksei V, MD  Active   levothyroxine  (SYNTHROID ) 50 MCG tablet [id number]  Take 1 tablet (50 mcg total) by mouth daily before breakfast. Overdue for Annual appt must see provider for future refills Plotnikov, Aleksei V, MD  Active   magnesium  oxide (MAG-OX) 400 MG tablet [id number]  Take 1 tablet (400 mg total) by mouth 2 (two) times daily. Plotnikov, Aleksei V, MD  Active Child, Pharmacy Records  Menthol -Zinc Oxide (CALMOSEPTINE) 0.44-20.6 % OINT [id number]  Apply topically. [provider]  Active Child, Pharmacy Records  omeprazole  (PRILOSEC) 40 MG capsule [id number]  Take 1 capsule (40 mg total) by mouth daily. Plotnikov, Aleksei V, MD  Active   polyethylene glycol (MIRALAX  / GLYCOLAX ) 17 g packet [id number]  Take 17 g by mouth 2 (two) times daily. Arrien, Elidia Sieving, MD  Active Child, Pharmacy Records  promethazine -dextromethorphan  (PROMETHAZINE -DM) 6.25-15 MG/5ML syrup [id number]  Take 5 mLs by mouth 4 (four) times daily as needed for cough. Plotnikov, [name], MD  Active Child, Pharmacy Records  rifaximin  (XIFAXAN ) 550 MG TABS tablet [id number]  Take 1 tablet (550 mg total) by mouth 2 (two) times  daily. Plotnikov, Aleksei V, MD  Active   sennosides-docusate sodium  (SENOKOT-S) 8.6-50 MG tablet [id number]  Take 2 tablets by mouth every 12 (twelve) hours. Plotnikov, Aleksei V, MD  Active   spironolactone  (ALDACTONE ) 25 MG tablet [id number]  Take 1 tablet (25 mg total) by mouth daily. Restart in 3 days Fairy Frames, MD  Active   tolterodine  (DETROL  LA) 4 MG 24 hr capsule [id number]  Take 1 capsule (4 mg total) by mouth daily. Plotnikov, Aleksei V, MD  Active   torsemide  (DEMADEX ) 20 MG tablet [id number]  Take 3 tablets (60 mg total) by mouth daily. Restart tomorrow Fairy Frames, MD  Active   traMADol  (ULTRAM ) 50 MG tablet [id number]  Take 1 tablet (50 mg total) by mouth every 6 (six) hours as needed for severe pain (pain score 7-10). Plotnikov, Aleksei V, MD  Active   triamcinolone  cream (KENALOG ) 0.1 % [id number]  Apply 1 Application topically 2 (two)  times daily. To dry patches on BLE and bilateral feet Plotnikov, [name], MD  Active Child, Pharmacy Records            Recommendation:   Continue Current Plan of Care  Follow Up Plan:   Telephone follow-up 07/26/2024 at 10:30 am  Olam Ally, MSW, LCSW [place]  [facility], [facility] Licensed Clinical Social Worker Direct Dial: [phone]

## 2024-07-10 NOTE — Patient Instructions (Signed)
 Visit Information  Thank you for taking time to visit with me today. Please don't hesitate to contact me if I can be of assistance to you before our next scheduled appointment.  Your next care management appointment is by telephone on 07/26/2024  at 10:30 am   Please call the care guide team at [phone] if you need to cancel, schedule, or reschedule an appointment.   Please call 911 if you are experiencing a Mental Health or Behavioral Health Crisis or need someone to talk to.  Olam Ally, MSW, LCSW [place]  [facility], [facility] Licensed Clinical Social Worker Direct Dial: [phone]

## 2024-07-11 ENCOUNTER — Telehealth: Payer: Self-pay

## 2024-07-11 ENCOUNTER — Other Ambulatory Visit: Payer: Self-pay

## 2024-07-11 DIAGNOSIS — S4292XD Fracture of left shoulder girdle, part unspecified, subsequent encounter for fracture with routine healing: Secondary | ICD-10-CM | POA: Diagnosis not present

## 2024-07-11 DIAGNOSIS — E039 Hypothyroidism, unspecified: Secondary | ICD-10-CM | POA: Diagnosis not present

## 2024-07-11 DIAGNOSIS — K7460 Unspecified cirrhosis of liver: Secondary | ICD-10-CM | POA: Diagnosis not present

## 2024-07-11 DIAGNOSIS — I4821 Permanent atrial fibrillation: Secondary | ICD-10-CM | POA: Diagnosis not present

## 2024-07-11 DIAGNOSIS — H1045 Other chronic allergic conjunctivitis: Secondary | ICD-10-CM | POA: Diagnosis not present

## 2024-07-11 DIAGNOSIS — N1832 Chronic kidney disease, stage 3b: Secondary | ICD-10-CM | POA: Diagnosis not present

## 2024-07-11 DIAGNOSIS — D631 Anemia in chronic kidney disease: Secondary | ICD-10-CM | POA: Diagnosis not present

## 2024-07-11 DIAGNOSIS — I130 Hypertensive heart and chronic kidney disease with heart failure and stage 1 through stage 4 chronic kidney disease, or unspecified chronic kidney disease: Secondary | ICD-10-CM | POA: Diagnosis not present

## 2024-07-11 DIAGNOSIS — I5023 Acute on chronic systolic (congestive) heart failure: Secondary | ICD-10-CM | POA: Diagnosis not present

## 2024-07-11 NOTE — Telephone Encounter (Signed)
 Copied from CRM [id number]. Topic: Clinical - Home Health Verbal Orders >> Jul 11, 2024  4:15 PM Rea ORN wrote: Caller/Agency: Delon KLEIN, [facility] Callback Number: [phone] Service Requested: Physical Therapy Frequency: one week/ 9 Any new concerns about the patient? No

## 2024-07-11 NOTE — Patient Instructions (Signed)
 Visit Information  Thank you for taking time to visit with me today. Please don't hesitate to contact me if I can be of assistance to you before our next scheduled appointment.  Your next care management appointment is by telephone on 08/09/24 at 11:30am  Please call the care guide team at [phone] if you need to cancel, schedule, or reschedule an appointment.   Please call the Suicide and Crisis Lifeline: 988 call the [place]  National Suicide Prevention Lifeline: 250-770-5623 or TTY: 913-502-0574 TTY 808-480-9431) to talk to a trained counselor call 1-800-273-TALK (toll free, 24 hour hotline) if you are experiencing a Mental Health or Behavioral Health Crisis or need someone to talk to.  Heddy Shutter, RN, MSN, BSN, CCM [place]  [facility], Population Health Case Manager Phone: [phone]

## 2024-07-11 NOTE — Patient Outreach (Signed)
 Complex Care Management   Visit Note  07/11/2024  Name:  Katelyn Jackson MRN: [id number] DOB: Mar 07, 1946  Situation: Referral received for Complex Care Management related to Heart Failure I obtained verbal consent from [name] Velsher(son/dpr).  Visit completed with Pama Memo  on the phone  Background:   Past Medical History:  Diagnosis Date   Acute lower UTI 06/19/2017   Arthritis    Breast lesion 12/07/2021   Cancer (HCC)    breast cancer   Chronic atrial fibrillation (HCC)    Chronic kidney disease    sees Dr Perri   Colonization with VRE (vancomycin -resistant enterococcus) 09/22/2021   Cramps, muscle, general    Dry skin 09/22/2021   Dyspnea on exertion    Dysrhythmia    a-fib,    ESBL E. coli carrier 09/22/2021   GERD (gastroesophageal reflux disease)    Headache    Herpes labialis 08/19/2021   Hyperlipidemia    Hypertension    Hypothyroidism    Lymphedema    Moderate to severe pulmonary hypertension (HCC)    Obesity    Osteoarthritis of right knee 08/19/2021   Personal history of chemotherapy    Personal history of radiation therapy    Pneumonia 12/2020   Presence of permanent cardiac pacemaker 01/21/2021   for bradycardia    Syncope 12/2020   needed a pacemaker   UTI (urinary tract infection) 01/05/2021    Assessment: Patient Reported Symptoms:  Cognitive Cognitive Status: No symptoms reported (spoke with patient's son)      Neurological Neurological Review of Symptoms: No symptoms reported    HEENT HEENT Symptoms Reported: No symptoms reported      Cardiovascular Cardiovascular Symptoms Reported: No symptoms reported    Respiratory Respiratory Symptoms Reported: No symptoms reported    Endocrine Endocrine Symptoms Reported: No symptoms reported Is patient diabetic?: No    Gastrointestinal Gastrointestinal Symptoms Reported: No symptoms reported      Genitourinary Genitourinary Symptoms Reported: Frequency Additional Genitourinary  Details: being treated for UTI, frequency in urination. treated for overactive bladder also    Integumentary Integumentary Symptoms Reported: No symptoms reported    Musculoskeletal Additional Musculoskeletal Details: uses walker for ambulation. taking medications for arthritis in knees. per son helps and decreases pain.        Psychosocial Psychosocial Symptoms Reported: Not assessed           There were no vitals filed for this visit.  Medications Reviewed Today     Reviewed by Ann Bohne M, RN (Registered Nurse) on 07/11/24 at 1420  Med List Status: <None>   Medication Order Taking? Sig Documenting Provider Last Dose Status Informant  acetaminophen  (TYLENOL ) 500 MG tablet [id number] Yes Take 1,000 mg by mouth daily as needed for mild pain (pain score 1-3). [provider]  Active Child, Pharmacy Records  allopurinol  (ZYLOPRIM ) 100 MG tablet [id number] Yes Take 0.5 tablets (50 mg total) by mouth daily. Plotnikov, [name], MD  Active Child, Pharmacy Records  apixaban  (ELIQUIS ) 5 MG TABS tablet [id number] Yes Take 1 tablet (5 mg total) by mouth 2 (two) times daily. Plotnikov, Aleksei V, MD  Active Child, Pharmacy Records  carvedilol  (COREG ) 12.5 MG tablet [id number] Yes Take 1 tablet (12.5 mg total) by mouth 2 (two) times daily with a meal. Plotnikov, [name], MD  Active Child, Pharmacy Records  cephALEXin  (KEFLEX ) 250 MG capsule [id number] Yes Take by mouth. [provider]  Active   ipratropium-albuterol  (DUONEB) 0.5-2.5 (3) MG/3ML SOLN [id number] Yes Take 3 mLs  by nebulization every 6 (six) hours as needed. Plotnikov, Aleksei V, MD  Active Child, Pharmacy Records  lactulose  (CHRONULAC ) 10 GM/15ML solution [id number] Yes TAKE 15 MILLILITERS BY MOUTH THREE TIMESDAILY Plotnikov, Aleksei V, MD  Active   levothyroxine  (SYNTHROID ) 50 MCG tablet [id number] Yes Take 1 tablet (50 mcg total) by mouth daily before breakfast. Overdue for Annual appt must see provider for future  refills Plotnikov, Aleksei V, MD  Active   magnesium  oxide (MAG-OX) 400 MG tablet [id number] Yes Take 1 tablet (400 mg total) by mouth 2 (two) times daily. Plotnikov, Aleksei V, MD  Active Child, Pharmacy Records  Menthol -Zinc Oxide (CALMOSEPTINE) 0.44-20.6 % OINT [id number] Yes Apply topically. [provider]  Active Child, Pharmacy Records  omeprazole  [facility]) 40 MG capsule [id number] Yes Take 1 capsule (40 mg total) by mouth daily. Plotnikov, Aleksei V, MD  Active   polyethylene glycol (MIRALAX  / GLYCOLAX ) 17 g packet [id number] Yes Take 17 g by mouth 2 (two) times daily. Arrien, Elidia Sieving, MD  Active Child, Pharmacy Records  promethazine -dextromethorphan  (PROMETHAZINE -DM) 6.25-15 MG/5ML syrup [id number] Yes Take 5 mLs by mouth 4 (four) times daily as needed for cough. Plotnikov, Aleksei V, MD  Active Child, Pharmacy Records  rifaximin  (XIFAXAN ) 550 MG TABS tablet [id number] Yes Take 1 tablet (550 mg total) by mouth 2 (two) times daily. Plotnikov, Aleksei V, MD  Active   sennosides-docusate sodium  (SENOKOT-S) 8.6-50 MG tablet [id number] Yes Take 2 tablets by mouth every 12 (twelve) hours. Plotnikov, Aleksei V, MD  Active   spironolactone  (ALDACTONE ) 25 MG tablet [id number] Yes Take 1 tablet (25 mg total) by mouth daily. Restart in 3 days Fairy Frames, MD  Active   tolterodine  (DETROL  LA) 4 MG 24 hr capsule [id number] Yes Take 1 capsule (4 mg total) by mouth daily. Plotnikov, Aleksei V, MD  Active   torsemide  (DEMADEX ) 20 MG tablet [id number] Yes Take 3 tablets (60 mg total) by mouth daily. Restart tomorrow Fairy Frames, MD  Active   traMADol  (ULTRAM ) 50 MG tablet [id number] Yes Take 1 tablet (50 mg total) by mouth every 6 (six) hours as needed for severe pain (pain score 7-10). Plotnikov, Aleksei V, MD  Active   triamcinolone  cream (KENALOG ) 0.1 % [id number] Yes Apply 1 Application topically 2 (two) times daily. To dry patches on BLE and bilateral feet Plotnikov, [name], MD  Active  Child, Pharmacy Records          Recommendation:   Continue Current Plan of Care  Follow Up Plan:   Telephone follow up appointment date/time:  08/09/24 at 11:30 am  Heddy Shutter, RN, MSN, BSN, CCM [place]  [facility], Population Health Case Manager Phone: [phone]

## 2024-07-12 NOTE — Telephone Encounter (Signed)
 Okay. Thank you.

## 2024-07-16 DIAGNOSIS — H1045 Other chronic allergic conjunctivitis: Secondary | ICD-10-CM | POA: Diagnosis not present

## 2024-07-16 DIAGNOSIS — K7460 Unspecified cirrhosis of liver: Secondary | ICD-10-CM | POA: Diagnosis not present

## 2024-07-16 DIAGNOSIS — E039 Hypothyroidism, unspecified: Secondary | ICD-10-CM | POA: Diagnosis not present

## 2024-07-16 DIAGNOSIS — N1832 Chronic kidney disease, stage 3b: Secondary | ICD-10-CM | POA: Diagnosis not present

## 2024-07-16 DIAGNOSIS — I130 Hypertensive heart and chronic kidney disease with heart failure and stage 1 through stage 4 chronic kidney disease, or unspecified chronic kidney disease: Secondary | ICD-10-CM | POA: Diagnosis not present

## 2024-07-16 DIAGNOSIS — S4292XD Fracture of left shoulder girdle, part unspecified, subsequent encounter for fracture with routine healing: Secondary | ICD-10-CM | POA: Diagnosis not present

## 2024-07-16 DIAGNOSIS — I4821 Permanent atrial fibrillation: Secondary | ICD-10-CM | POA: Diagnosis not present

## 2024-07-16 DIAGNOSIS — I5023 Acute on chronic systolic (congestive) heart failure: Secondary | ICD-10-CM | POA: Diagnosis not present

## 2024-07-16 DIAGNOSIS — D631 Anemia in chronic kidney disease: Secondary | ICD-10-CM | POA: Diagnosis not present

## 2024-07-16 NOTE — Telephone Encounter (Unsigned)
 Copied from CRM [id number]. Topic: General - Other >> Jul 16, 2024  4:06 PM Dedra B wrote: Reason for CRM: Delon, PT with [facility] calling to follow up on PT verbal orders for patient. Pls call Delon at [phone]

## 2024-07-18 NOTE — Telephone Encounter (Signed)
 Called and gave ok for verbal orders to [facility]

## 2024-07-20 DIAGNOSIS — K7460 Unspecified cirrhosis of liver: Secondary | ICD-10-CM | POA: Diagnosis not present

## 2024-07-20 DIAGNOSIS — E039 Hypothyroidism, unspecified: Secondary | ICD-10-CM | POA: Diagnosis not present

## 2024-07-20 DIAGNOSIS — I4821 Permanent atrial fibrillation: Secondary | ICD-10-CM | POA: Diagnosis not present

## 2024-07-20 DIAGNOSIS — S4292XD Fracture of left shoulder girdle, part unspecified, subsequent encounter for fracture with routine healing: Secondary | ICD-10-CM | POA: Diagnosis not present

## 2024-07-20 DIAGNOSIS — N1832 Chronic kidney disease, stage 3b: Secondary | ICD-10-CM | POA: Diagnosis not present

## 2024-07-20 DIAGNOSIS — H1045 Other chronic allergic conjunctivitis: Secondary | ICD-10-CM | POA: Diagnosis not present

## 2024-07-20 DIAGNOSIS — D631 Anemia in chronic kidney disease: Secondary | ICD-10-CM | POA: Diagnosis not present

## 2024-07-20 DIAGNOSIS — I5023 Acute on chronic systolic (congestive) heart failure: Secondary | ICD-10-CM | POA: Diagnosis not present

## 2024-07-24 ENCOUNTER — Ambulatory Visit: Admitting: Podiatry

## 2024-08-06 ENCOUNTER — Telehealth: Payer: Self-pay | Admitting: Radiology

## 2024-08-06 NOTE — Telephone Encounter (Signed)
 Copied from CRM [id number]. Topic: General - Other >> Aug 06, 2024 10:26 AM [name] SQUIBB wrote: Reason for CRM: Elna from [facility] is calling in to let the doctor know that the patient is out of town and will not be doing her PT this week.. If any questions Gerald's call back number is [phone]

## 2024-08-07 DIAGNOSIS — N1832 Chronic kidney disease, stage 3b: Secondary | ICD-10-CM | POA: Diagnosis not present

## 2024-08-07 DIAGNOSIS — E785 Hyperlipidemia, unspecified: Secondary | ICD-10-CM

## 2024-08-07 DIAGNOSIS — I130 Hypertensive heart and chronic kidney disease with heart failure and stage 1 through stage 4 chronic kidney disease, or unspecified chronic kidney disease: Secondary | ICD-10-CM | POA: Diagnosis not present

## 2024-08-07 DIAGNOSIS — Z950 Presence of cardiac pacemaker: Secondary | ICD-10-CM

## 2024-08-07 DIAGNOSIS — D631 Anemia in chronic kidney disease: Secondary | ICD-10-CM | POA: Diagnosis not present

## 2024-08-07 DIAGNOSIS — E039 Hypothyroidism, unspecified: Secondary | ICD-10-CM | POA: Diagnosis not present

## 2024-08-07 DIAGNOSIS — H1045 Other chronic allergic conjunctivitis: Secondary | ICD-10-CM | POA: Diagnosis not present

## 2024-08-07 DIAGNOSIS — S4292XD Fracture of left shoulder girdle, part unspecified, subsequent encounter for fracture with routine healing: Secondary | ICD-10-CM | POA: Diagnosis not present

## 2024-08-07 DIAGNOSIS — I5023 Acute on chronic systolic (congestive) heart failure: Secondary | ICD-10-CM | POA: Diagnosis not present

## 2024-08-07 DIAGNOSIS — K219 Gastro-esophageal reflux disease without esophagitis: Secondary | ICD-10-CM

## 2024-08-07 DIAGNOSIS — K7460 Unspecified cirrhosis of liver: Secondary | ICD-10-CM | POA: Diagnosis not present

## 2024-08-07 DIAGNOSIS — I4821 Permanent atrial fibrillation: Secondary | ICD-10-CM | POA: Diagnosis not present

## 2024-08-09 ENCOUNTER — Other Ambulatory Visit: Payer: Self-pay

## 2024-08-09 NOTE — Patient Outreach (Signed)
 Complex Care Management   Visit Note  08/09/2024  Name:  Katelyn Jackson MRN: [id number] DOB: Sep 19, 1946  Situation: Referral received for Complex Care Management related to Heart Failure I obtained verbal consent from Patient.  Visit completed with Patient  on the phone  Background:   Past Medical History:  Diagnosis Date   Acute lower UTI 06/19/2017   Arthritis    Breast lesion 12/07/2021   Cancer (HCC)    breast cancer   Chronic atrial fibrillation (HCC)    Chronic kidney disease    sees Dr Perri   Colonization with VRE (vancomycin -resistant enterococcus) 09/22/2021   Cramps, muscle, general    Dry skin 09/22/2021   Dyspnea on exertion    Dysrhythmia    a-fib,    ESBL E. coli carrier 09/22/2021   GERD (gastroesophageal reflux disease)    Headache    Herpes labialis 08/19/2021   Hyperlipidemia    Hypertension    Hypothyroidism    Lymphedema    Moderate to severe pulmonary hypertension (HCC)    Obesity    Osteoarthritis of right knee 08/19/2021   Personal history of chemotherapy    Personal history of radiation therapy    Pneumonia 12/2020   Presence of permanent cardiac pacemaker 01/21/2021   for bradycardia    Syncope 12/2020   needed a pacemaker   UTI (urinary tract infection) 01/05/2021    Assessment: Son denies any questions or concerns. Reports they have been out of town and on the way back. He denies any signs/symptoms of HF exacerbation. Son states he will communicate with home health agency to get patient scheduled for next therapy visit.  Patient Reported Symptoms:  Cognitive Cognitive Status: No symptoms reported      Neurological Neurological Review of Symptoms: No symptoms reported    HEENT HEENT Symptoms Reported: No symptoms reported      Cardiovascular Cardiovascular Symptoms Reported: No symptoms reported Does patient have uncontrolled Hypertension?: No Cardiovascular Management Strategies: Medication therapy, Fluid modification, Routine  screening Cardiovascular Comment: Son reports patient has been out of town and has not been able to weigh patient. BP today 124/79 HR 78. He denies any signs/symptoms of HF exacerbation.  Respiratory Respiratory Symptoms Reported: No symptoms reported    Endocrine Endocrine Symptoms Reported: No symptoms reported Is patient diabetic?: No    Gastrointestinal Gastrointestinal Symptoms Reported: No symptoms reported      Genitourinary Genitourinary Symptoms Reported: No symptoms reported    Integumentary Integumentary Symptoms Reported: No symptoms reported    Musculoskeletal Musculoskelatal Symptoms Reviewed: Limited mobility Additional Musculoskeletal Details: continues to be active with home health therapy [facility] Musculoskeletal Management Strategies: Medication therapy, Routine screening, Exercise      Psychosocial Psychosocial Symptoms Reported: Not assessed          There were no vitals filed for this visit.  Medications Reviewed Today     Reviewed by Suzette Flagler M, RN (Registered Nurse) on 08/09/24 at 1150  Med List Status: <None>   Medication Order Taking? Sig Documenting Provider Last Dose Status Informant  acetaminophen  (TYLENOL ) 500 MG tablet [id number] Yes Take 1,000 mg by mouth daily as needed for mild pain (pain score 1-3). [provider]  Active Child, Pharmacy Records  allopurinol  (ZYLOPRIM ) 100 MG tablet [id number] Yes Take 0.5 tablets (50 mg total) by mouth daily. Plotnikov, [name], MD  Active Child, Pharmacy Records  apixaban  (ELIQUIS ) 5 MG TABS tablet [id number] Yes Take 1 tablet (5 mg total) by mouth 2 (two) times daily.  Plotnikov, Aleksei V, MD  Active Child, Pharmacy Records  carvedilol  (COREG ) 12.5 MG tablet [id number] Yes Take 1 tablet (12.5 mg total) by mouth 2 (two) times daily with a meal. Plotnikov, [name], MD  Active Child, Pharmacy Records  cephALEXin  (KEFLEX ) 250 MG capsule [id number]  Take by mouth. [provider]  Active    ipratropium-albuterol  (DUONEB) 0.5-2.5 (3) MG/3ML SOLN [id number] Yes Take 3 mLs by nebulization every 6 (six) hours as needed. Plotnikov, Aleksei V, MD  Active Child, Pharmacy Records  lactulose  (CHRONULAC ) 10 GM/15ML solution [id number] Yes TAKE 15 MILLILITERS BY MOUTH THREE TIMESDAILY Plotnikov, Aleksei V, MD  Active   levothyroxine  (SYNTHROID ) 50 MCG tablet [id number] Yes Take 1 tablet (50 mcg total) by mouth daily before breakfast. Overdue for Annual appt must see provider for future refills Plotnikov, Aleksei V, MD  Active   magnesium  oxide (MAG-OX) 400 MG tablet [id number] Yes Take 1 tablet (400 mg total) by mouth 2 (two) times daily. Plotnikov, Aleksei V, MD  Active Child, Pharmacy Records  Menthol -Zinc Oxide (CALMOSEPTINE) 0.44-20.6 % OINT [id number] Yes Apply topically. [provider]  Active Child, Pharmacy Records  omeprazole  (PRILOSEC) 40 MG capsule [id number] Yes Take 1 capsule (40 mg total) by mouth daily. Plotnikov, Aleksei V, MD  Active   polyethylene glycol (MIRALAX  / GLYCOLAX ) 17 g packet [id number] Yes Take 17 g by mouth 2 (two) times daily. Arrien, Elidia Sieving, MD  Active Child, Pharmacy Records  promethazine -dextromethorphan  (PROMETHAZINE -DM) 6.25-15 MG/5ML syrup [id number] Yes Take 5 mLs by mouth 4 (four) times daily as needed for cough. Plotnikov, Aleksei V, MD  Active Child, Pharmacy Records  rifaximin  (XIFAXAN ) 550 MG TABS tablet [id number] Yes Take 1 tablet (550 mg total) by mouth 2 (two) times daily. Plotnikov, Aleksei V, MD  Active   sennosides-docusate sodium  (SENOKOT-S) 8.6-50 MG tablet [id number] Yes Take 2 tablets by mouth every 12 (twelve) hours. Plotnikov, Aleksei V, MD  Active   spironolactone  (ALDACTONE ) 25 MG tablet [id number] Yes Take 1 tablet (25 mg total) by mouth daily. Restart in 3 days Fairy Frames, MD  Active   tolterodine  (DETROL  LA) 4 MG 24 hr capsule [id number] Yes Take 1 capsule (4 mg total) by mouth daily. Plotnikov, Aleksei V, MD  Active    torsemide  (DEMADEX ) 20 MG tablet [id number] Yes Take 3 tablets (60 mg total) by mouth daily. Restart tomorrow Fairy Frames, MD  Active   traMADol  (ULTRAM ) 50 MG tablet [id number] Yes Take 1 tablet (50 mg total) by mouth every 6 (six) hours as needed for severe pain (pain score 7-10). Plotnikov, Aleksei V, MD  Active   triamcinolone  cream (KENALOG ) 0.1 % [id number] Yes Apply 1 Application topically 2 (two) times daily. To dry patches on BLE and bilateral feet Plotnikov, [name], MD  Active Child, Pharmacy Records          Recommendation:   Continue Current Plan of Care  Follow Up Plan:   Telephone follow up appointment date/time:  09/09/24 at 2:00 pm  Heddy Shutter, RN, MSN, BSN, CCM [place]  [facility], Population Health Case Manager Phone: [phone]

## 2024-08-09 NOTE — Patient Instructions (Signed)
 Visit Information  Thank you for taking time to visit with me today. Please don't hesitate to contact me if I can be of assistance to you before our next scheduled appointment.  Your next care management appointment is by telephone on 09/09/24 at 2:00 pm  Please call the care guide team at [phone] if you need to cancel, schedule, or reschedule an appointment.   Please call the Suicide and Crisis Lifeline: 988 call the [place]  National Suicide Prevention Lifeline: (727)676-3108 or TTY: 913-535-3197 TTY 870-755-9323) to talk to a trained counselor if you are experiencing a Mental Health or Behavioral Health Crisis or need someone to talk to.  Heddy Shutter, RN, MSN, BSN, CCM [place]  [facility], Population Health Case Manager Phone: [phone]

## 2024-08-12 NOTE — Telephone Encounter (Signed)
 Noted

## 2024-08-14 DIAGNOSIS — E039 Hypothyroidism, unspecified: Secondary | ICD-10-CM | POA: Diagnosis not present

## 2024-08-15 DIAGNOSIS — K7460 Unspecified cirrhosis of liver: Secondary | ICD-10-CM | POA: Diagnosis not present

## 2024-08-15 DIAGNOSIS — I5023 Acute on chronic systolic (congestive) heart failure: Secondary | ICD-10-CM | POA: Diagnosis not present

## 2024-08-15 DIAGNOSIS — S4292XD Fracture of left shoulder girdle, part unspecified, subsequent encounter for fracture with routine healing: Secondary | ICD-10-CM | POA: Diagnosis not present

## 2024-08-15 DIAGNOSIS — I130 Hypertensive heart and chronic kidney disease with heart failure and stage 1 through stage 4 chronic kidney disease, or unspecified chronic kidney disease: Secondary | ICD-10-CM | POA: Diagnosis not present

## 2024-08-15 DIAGNOSIS — D631 Anemia in chronic kidney disease: Secondary | ICD-10-CM | POA: Diagnosis not present

## 2024-08-15 DIAGNOSIS — I4821 Permanent atrial fibrillation: Secondary | ICD-10-CM | POA: Diagnosis not present

## 2024-08-15 DIAGNOSIS — H1045 Other chronic allergic conjunctivitis: Secondary | ICD-10-CM | POA: Diagnosis not present

## 2024-08-15 DIAGNOSIS — N1832 Chronic kidney disease, stage 3b: Secondary | ICD-10-CM | POA: Diagnosis not present

## 2024-08-15 DIAGNOSIS — E039 Hypothyroidism, unspecified: Secondary | ICD-10-CM | POA: Diagnosis not present

## 2024-08-21 DIAGNOSIS — K7460 Unspecified cirrhosis of liver: Secondary | ICD-10-CM | POA: Diagnosis not present

## 2024-08-21 DIAGNOSIS — D631 Anemia in chronic kidney disease: Secondary | ICD-10-CM | POA: Diagnosis not present

## 2024-08-21 DIAGNOSIS — S4292XD Fracture of left shoulder girdle, part unspecified, subsequent encounter for fracture with routine healing: Secondary | ICD-10-CM | POA: Diagnosis not present

## 2024-08-21 DIAGNOSIS — H1045 Other chronic allergic conjunctivitis: Secondary | ICD-10-CM | POA: Diagnosis not present

## 2024-08-21 DIAGNOSIS — E039 Hypothyroidism, unspecified: Secondary | ICD-10-CM | POA: Diagnosis not present

## 2024-08-21 DIAGNOSIS — I5023 Acute on chronic systolic (congestive) heart failure: Secondary | ICD-10-CM | POA: Diagnosis not present

## 2024-08-21 DIAGNOSIS — I130 Hypertensive heart and chronic kidney disease with heart failure and stage 1 through stage 4 chronic kidney disease, or unspecified chronic kidney disease: Secondary | ICD-10-CM | POA: Diagnosis not present

## 2024-08-21 DIAGNOSIS — N1832 Chronic kidney disease, stage 3b: Secondary | ICD-10-CM | POA: Diagnosis not present

## 2024-08-21 DIAGNOSIS — I4821 Permanent atrial fibrillation: Secondary | ICD-10-CM | POA: Diagnosis not present

## 2024-08-28 DIAGNOSIS — I4821 Permanent atrial fibrillation: Secondary | ICD-10-CM | POA: Diagnosis not present

## 2024-08-28 DIAGNOSIS — H1045 Other chronic allergic conjunctivitis: Secondary | ICD-10-CM | POA: Diagnosis not present

## 2024-08-28 DIAGNOSIS — K7460 Unspecified cirrhosis of liver: Secondary | ICD-10-CM | POA: Diagnosis not present

## 2024-08-28 DIAGNOSIS — E039 Hypothyroidism, unspecified: Secondary | ICD-10-CM | POA: Diagnosis not present

## 2024-08-28 DIAGNOSIS — I130 Hypertensive heart and chronic kidney disease with heart failure and stage 1 through stage 4 chronic kidney disease, or unspecified chronic kidney disease: Secondary | ICD-10-CM | POA: Diagnosis not present

## 2024-08-28 DIAGNOSIS — N1832 Chronic kidney disease, stage 3b: Secondary | ICD-10-CM | POA: Diagnosis not present

## 2024-08-28 DIAGNOSIS — S4292XD Fracture of left shoulder girdle, part unspecified, subsequent encounter for fracture with routine healing: Secondary | ICD-10-CM | POA: Diagnosis not present

## 2024-08-28 DIAGNOSIS — D631 Anemia in chronic kidney disease: Secondary | ICD-10-CM | POA: Diagnosis not present

## 2024-08-28 DIAGNOSIS — I5023 Acute on chronic systolic (congestive) heart failure: Secondary | ICD-10-CM | POA: Diagnosis not present

## 2024-09-04 ENCOUNTER — Telehealth: Payer: Self-pay

## 2024-09-04 NOTE — Telephone Encounter (Unsigned)
 Copied from CRM [id number]. Topic: Clinical - Medical Advice >> Sep 04, 2024  4:07 PM Charolett L wrote: Reason for CRM: Doyle Gaba stated that Patient recorded a fall, no pain and no injuries from fall

## 2024-09-05 NOTE — Telephone Encounter (Signed)
Noted.  Please continue to watch.  Thanks

## 2024-09-09 ENCOUNTER — Telehealth: Payer: Self-pay

## 2024-09-09 ENCOUNTER — Other Ambulatory Visit: Payer: Self-pay

## 2024-09-09 DIAGNOSIS — I5023 Acute on chronic systolic (congestive) heart failure: Secondary | ICD-10-CM | POA: Diagnosis not present

## 2024-09-09 DIAGNOSIS — D631 Anemia in chronic kidney disease: Secondary | ICD-10-CM | POA: Diagnosis not present

## 2024-09-09 DIAGNOSIS — K7460 Unspecified cirrhosis of liver: Secondary | ICD-10-CM | POA: Diagnosis not present

## 2024-09-09 DIAGNOSIS — I130 Hypertensive heart and chronic kidney disease with heart failure and stage 1 through stage 4 chronic kidney disease, or unspecified chronic kidney disease: Secondary | ICD-10-CM | POA: Diagnosis not present

## 2024-09-09 DIAGNOSIS — N1832 Chronic kidney disease, stage 3b: Secondary | ICD-10-CM | POA: Diagnosis not present

## 2024-09-09 DIAGNOSIS — S4292XD Fracture of left shoulder girdle, part unspecified, subsequent encounter for fracture with routine healing: Secondary | ICD-10-CM | POA: Diagnosis not present

## 2024-09-09 DIAGNOSIS — E039 Hypothyroidism, unspecified: Secondary | ICD-10-CM | POA: Diagnosis not present

## 2024-09-09 DIAGNOSIS — H1045 Other chronic allergic conjunctivitis: Secondary | ICD-10-CM | POA: Diagnosis not present

## 2024-09-09 NOTE — Patient Outreach (Signed)
 Complex Care Management   Visit Note  09/09/2024  Name:  Katelyn Jackson MRN: [id number] DOB: 03/23/1946  Situation: Referral received for Complex Care Management related to Heart Failure I obtained verbal consent from Son Katelyn Jackson.  Visit completed with Katelyn Jackson, son  on the phone  Background:   Past Medical History:  Diagnosis Date   Acute lower UTI 06/19/2017   Arthritis    Breast lesion 12/07/2021   Cancer (HCC)    breast cancer   Chronic atrial fibrillation (HCC)    Chronic kidney disease    sees Dr Perri   Colonization with VRE (vancomycin -resistant enterococcus) 09/22/2021   Cramps, muscle, general    Dry skin 09/22/2021   Dyspnea on exertion    Dysrhythmia    a-fib,    ESBL E. coli carrier 09/22/2021   GERD (gastroesophageal reflux disease)    Headache    Herpes labialis 08/19/2021   Hyperlipidemia    Hypertension    Hypothyroidism    Lymphedema    Moderate to severe pulmonary hypertension (HCC)    Obesity    Osteoarthritis of right knee 08/19/2021   Personal history of chemotherapy    Personal history of radiation therapy    Pneumonia 12/2020   Presence of permanent cardiac pacemaker 01/21/2021   for bradycardia    Syncope 12/2020   needed a pacemaker   UTI (urinary tract infection) 01/05/2021    Assessment: Patient Reported Symptoms:  Cognitive Cognitive Status: No symptoms reported      Neurological Neurological Review of Symptoms: No symptoms reported    HEENT HEENT Symptoms Reported: No symptoms reported      Cardiovascular Cardiovascular Symptoms Reported: Swelling in legs or feet Other Cardiovascular Symptoms: Per patient's son, patient with left knee pain/swelling. he reports patient has arthritis in that knee and that it has been broken in the past. Son denies any signs/symptoms of fluid volume overload at this time. Does patient have uncontrolled Hypertension?: No Weight: 144 lb (65.3 kg) (reports home reading today.)   Respiratory Respiratory Symptoms Reported: No symptoms reported    Endocrine Endocrine Symptoms Reported: No symptoms reported Is patient diabetic?: No    Gastrointestinal Gastrointestinal Symptoms Reported: No symptoms reported      Genitourinary Genitourinary Symptoms Reported: No symptoms reported    Integumentary Integumentary Symptoms Reported: No symptoms reported    Musculoskeletal Musculoskelatal Symptoms Reviewed: Limited mobility Additional Musculoskeletal Details: left knee pain- per son, paitient has bad arthritis in the  left knee. he reports he is going to schedule an appointment with her orthopedic provider. He reports she takes medication to help control her pain. Patient reports her pain is 3-4/10 during at night and 7-8/10 during the day. Per son, patient continues to be active with Home health and states patient is scheduled for a re-evaluateion today. Musculoskeletal Management Strategies: Medication therapy, Medical device, Adequate rest, Activity, Routine screening      Psychosocial Psychosocial Symptoms Reported: No symptoms reported (per son no signs/symptoms of depressions or anxiety)         Vitals:   09/09/24 1358  BP: 130/70  Pulse: 76    Medications Reviewed Today     Reviewed by Katelyn Fees M, RN (Registered Nurse) on 09/09/24 at 1408  Med List Status: <None>   Medication Order Taking? Sig Documenting Provider Last Dose Status Informant  acetaminophen  (TYLENOL ) 500 MG tablet [id number] Yes Take 1,000 mg by mouth daily as needed for mild pain (pain score 1-3). [provider]  Active  Child, Pharmacy Records  allopurinol  (ZYLOPRIM ) 100 MG tablet [id number] Yes Take 0.5 tablets (50 mg total) by mouth daily. Plotnikov, [name], MD  Active Child, Pharmacy Records  apixaban  (ELIQUIS ) 5 MG TABS tablet [id number] Yes Take 1 tablet (5 mg total) by mouth 2 (two) times daily. Plotnikov, Aleksei V, MD  Active Child, Pharmacy Records  carvedilol   (COREG ) 12.5 MG tablet [id number] Yes Take 1 tablet (12.5 mg total) by mouth 2 (two) times daily with a meal. Plotnikov, [name], MD  Active Child, Pharmacy Records  cephALEXin  (KEFLEX ) 250 MG capsule [id number]  Take by mouth.  Patient not taking: Reported on 09/09/2024   [provider]  Active   ipratropium-albuterol  (DUONEB) 0.5-2.5 (3) MG/3ML SOLN [id number] Yes Take 3 mLs by nebulization every 6 (six) hours as needed. Plotnikov, Aleksei V, MD  Active Child, Pharmacy Records  lactulose  (CHRONULAC ) 10 GM/15ML solution [id number] Yes TAKE 15 MILLILITERS BY MOUTH THREE TIMESDAILY Plotnikov, Aleksei V, MD  Active   levothyroxine  (SYNTHROID ) 50 MCG tablet [id number] Yes Take 1 tablet (50 mcg total) by mouth daily before breakfast. Overdue for Annual appt must see provider for future refills Plotnikov, Aleksei V, MD  Active   magnesium  oxide (MAG-OX) 400 MG tablet [id number] Yes Take 1 tablet (400 mg total) by mouth 2 (two) times daily. Plotnikov, Aleksei V, MD  Active Child, Pharmacy Records  Menthol -Zinc Oxide (CALMOSEPTINE) 0.44-20.6 % OINT [id number] Yes Apply topically. [provider]  Active Child, Pharmacy Records  omeprazole  [facility]) 40 MG capsule [id number] Yes Take 1 capsule (40 mg total) by mouth daily. Plotnikov, Aleksei V, MD  Active   polyethylene glycol (MIRALAX  / GLYCOLAX ) 17 g packet [id number] Yes Take 17 g by mouth 2 (two) times daily. Arrien, Elidia Sieving, MD  Active Child, Pharmacy Records  promethazine -dextromethorphan  (PROMETHAZINE -DM) 6.25-15 MG/5ML syrup [id number] Yes Take 5 mLs by mouth 4 (four) times daily as needed for cough. Plotnikov, Aleksei V, MD  Active Child, Pharmacy Records  rifaximin  (XIFAXAN ) 550 MG TABS tablet [id number] Yes Take 1 tablet (550 mg total) by mouth 2 (two) times daily. Plotnikov, Aleksei V, MD  Active   sennosides-docusate sodium  (SENOKOT-S) 8.6-50 MG tablet [id number] Yes Take 2 tablets by mouth every 12 (twelve) hours. Plotnikov,  Aleksei V, MD  Active   spironolactone  (ALDACTONE ) 25 MG tablet [id number] Yes Take 1 tablet (25 mg total) by mouth daily. Restart in 3 days Fairy Frames, MD  Active   tolterodine  (DETROL  LA) 4 MG 24 hr capsule [id number] Yes Take 1 capsule (4 mg total) by mouth daily. Plotnikov, Aleksei V, MD  Active   torsemide  (DEMADEX ) 20 MG tablet [id number] Yes Take 3 tablets (60 mg total) by mouth daily. Restart tomorrow Fairy Frames, MD  Active   traMADol  (ULTRAM ) 50 MG tablet [id number] Yes Take 1 tablet (50 mg total) by mouth every 6 (six) hours as needed for severe pain (pain score 7-10). Plotnikov, Aleksei V, MD  Active   triamcinolone  cream (KENALOG ) 0.1 % [id number] Yes Apply 1 Application topically 2 (two) times daily. To dry patches on BLE and bilateral feet Plotnikov, [name], MD  Active Child, Pharmacy Records          Recommendation:   Continue Current Plan of Care Son to reach out to patient's orthopedic provider regarding patient's knee pain  Follow Up Plan:   Telephone follow up appointment date/time:  10/09/24 at 11:30 am  Katelyn Shutter, RN, MSN, BSN, CCM [place]  [facility], [facility] Case Manager  Phone: [phone]

## 2024-09-09 NOTE — Patient Instructions (Signed)
 Visit Information  Thank you for taking time to visit with me today. Please don't hesitate to contact me if I can be of assistance to you before our next scheduled appointment.  Your next care management appointment is by telephone on 10/09/24 at 11:30 am   Please call the care guide team at [phone] if you need to cancel, schedule, or reschedule an appointment.   Please call the Suicide and Crisis Lifeline: 988 call the [place]  National Suicide Prevention Lifeline: 501 110 9057 or TTY: 570-611-0731 TTY 719-819-9286) to talk to a trained counselor if you are experiencing a Mental Health or Behavioral Health Crisis or need someone to talk to.   Heddy Shutter, RN, MSN, BSN, CCM [place]  [facility], Population Health Case Manager Phone: [phone]

## 2024-09-09 NOTE — Telephone Encounter (Signed)
 Copied from CRM [id number]. Topic: General - Other >> Sep 09, 2024  2:40 PM Larissa S wrote: Reason for CRM: Delon, PT with [facility] states patient is being discharged due to her insurance denying further authorization for services. Callback # [phone]

## 2024-09-10 NOTE — Telephone Encounter (Signed)
 Noted. Thanks.

## 2024-09-12 ENCOUNTER — Other Ambulatory Visit: Payer: Self-pay | Admitting: *Deleted

## 2024-09-13 NOTE — Patient Outreach (Signed)
 Phone call to patient's son to follow up on request for an independent assessment for personal care services. Per patient's son, he has not received a call from [facility] regarding the scheduling of an assessment. Phone call to [facility], confirmed that the paperwork received was completed with an outdated form. Form will need to be updated and re-submitted.  New form faxed to provider office [phone] for completion and return.  CSW will continue to follow    [employer], LCSW [place]  [facility], [facility] Licensed Clinical Social Worker  Direct Dial: [phone]

## 2024-09-19 DIAGNOSIS — I4891 Unspecified atrial fibrillation: Secondary | ICD-10-CM | POA: Diagnosis not present

## 2024-09-19 DIAGNOSIS — I4821 Permanent atrial fibrillation: Secondary | ICD-10-CM | POA: Diagnosis not present

## 2024-09-19 DIAGNOSIS — M25462 Effusion, left knee: Secondary | ICD-10-CM | POA: Diagnosis not present

## 2024-09-19 DIAGNOSIS — Z7901 Long term (current) use of anticoagulants: Secondary | ICD-10-CM | POA: Diagnosis not present

## 2024-09-19 DIAGNOSIS — E039 Hypothyroidism, unspecified: Secondary | ICD-10-CM | POA: Diagnosis not present

## 2024-09-19 DIAGNOSIS — I361 Nonrheumatic tricuspid (valve) insufficiency: Secondary | ICD-10-CM | POA: Diagnosis not present

## 2024-09-19 DIAGNOSIS — I890 Lymphedema, not elsewhere classified: Secondary | ICD-10-CM | POA: Diagnosis not present

## 2024-09-19 DIAGNOSIS — N390 Urinary tract infection, site not specified: Secondary | ICD-10-CM | POA: Diagnosis not present

## 2024-09-19 DIAGNOSIS — N189 Chronic kidney disease, unspecified: Secondary | ICD-10-CM | POA: Diagnosis not present

## 2024-09-19 DIAGNOSIS — W19XXXA Unspecified fall, initial encounter: Secondary | ICD-10-CM | POA: Diagnosis not present

## 2024-09-19 DIAGNOSIS — L0390 Cellulitis, unspecified: Secondary | ICD-10-CM | POA: Diagnosis not present

## 2024-09-19 DIAGNOSIS — M19012 Primary osteoarthritis, left shoulder: Secondary | ICD-10-CM | POA: Diagnosis not present

## 2024-09-19 DIAGNOSIS — R609 Edema, unspecified: Secondary | ICD-10-CM | POA: Diagnosis not present

## 2024-09-19 DIAGNOSIS — S79929A Unspecified injury of unspecified thigh, initial encounter: Secondary | ICD-10-CM | POA: Diagnosis not present

## 2024-09-19 DIAGNOSIS — R8281 Pyuria: Secondary | ICD-10-CM | POA: Diagnosis not present

## 2024-09-19 DIAGNOSIS — Z743 Need for continuous supervision: Secondary | ICD-10-CM | POA: Diagnosis not present

## 2024-09-19 DIAGNOSIS — J811 Chronic pulmonary edema: Secondary | ICD-10-CM | POA: Diagnosis not present

## 2024-09-19 DIAGNOSIS — R5383 Other fatigue: Secondary | ICD-10-CM | POA: Diagnosis not present

## 2024-09-19 DIAGNOSIS — M25552 Pain in left hip: Secondary | ICD-10-CM | POA: Diagnosis not present

## 2024-09-19 DIAGNOSIS — B951 Streptococcus, group B, as the cause of diseases classified elsewhere: Secondary | ICD-10-CM | POA: Diagnosis not present

## 2024-09-19 DIAGNOSIS — Z950 Presence of cardiac pacemaker: Secondary | ICD-10-CM | POA: Diagnosis not present

## 2024-09-19 DIAGNOSIS — I272 Pulmonary hypertension, unspecified: Secondary | ICD-10-CM | POA: Diagnosis not present

## 2024-09-19 DIAGNOSIS — E872 Acidosis, unspecified: Secondary | ICD-10-CM | POA: Diagnosis not present

## 2024-09-19 DIAGNOSIS — M00262 Other streptococcal arthritis, left knee: Secondary | ICD-10-CM | POA: Diagnosis not present

## 2024-09-19 DIAGNOSIS — I517 Cardiomegaly: Secondary | ICD-10-CM | POA: Diagnosis not present

## 2024-09-19 DIAGNOSIS — L03114 Cellulitis of left upper limb: Secondary | ICD-10-CM | POA: Diagnosis not present

## 2024-09-19 DIAGNOSIS — M00862 Arthritis due to other bacteria, left knee: Secondary | ICD-10-CM | POA: Diagnosis not present

## 2024-09-19 DIAGNOSIS — M7989 Other specified soft tissue disorders: Secondary | ICD-10-CM | POA: Diagnosis not present

## 2024-09-19 DIAGNOSIS — B9689 Other specified bacterial agents as the cause of diseases classified elsewhere: Secondary | ICD-10-CM | POA: Diagnosis not present

## 2024-09-19 DIAGNOSIS — M8888 Osteitis deformans of other bones: Secondary | ICD-10-CM | POA: Diagnosis not present

## 2024-09-19 DIAGNOSIS — I672 Cerebral atherosclerosis: Secondary | ICD-10-CM | POA: Diagnosis not present

## 2024-09-19 DIAGNOSIS — M79606 Pain in leg, unspecified: Secondary | ICD-10-CM | POA: Diagnosis not present

## 2024-09-19 DIAGNOSIS — R319 Hematuria, unspecified: Secondary | ICD-10-CM | POA: Diagnosis not present

## 2024-09-19 DIAGNOSIS — R7881 Bacteremia: Secondary | ICD-10-CM | POA: Diagnosis not present

## 2024-09-19 DIAGNOSIS — M7532 Calcific tendinitis of left shoulder: Secondary | ICD-10-CM | POA: Diagnosis not present

## 2024-09-19 DIAGNOSIS — N184 Chronic kidney disease, stage 4 (severe): Secondary | ICD-10-CM | POA: Diagnosis not present

## 2024-09-19 DIAGNOSIS — S8990XA Unspecified injury of unspecified lower leg, initial encounter: Secondary | ICD-10-CM | POA: Diagnosis not present

## 2024-09-19 DIAGNOSIS — I10 Essential (primary) hypertension: Secondary | ICD-10-CM | POA: Diagnosis not present

## 2024-09-19 DIAGNOSIS — M009 Pyogenic arthritis, unspecified: Secondary | ICD-10-CM | POA: Diagnosis not present

## 2024-09-26 DIAGNOSIS — E785 Hyperlipidemia, unspecified: Secondary | ICD-10-CM | POA: Diagnosis not present

## 2024-09-26 DIAGNOSIS — I10 Essential (primary) hypertension: Secondary | ICD-10-CM | POA: Diagnosis not present

## 2024-09-26 DIAGNOSIS — E039 Hypothyroidism, unspecified: Secondary | ICD-10-CM | POA: Diagnosis not present

## 2024-09-26 DIAGNOSIS — R7881 Bacteremia: Secondary | ICD-10-CM | POA: Diagnosis not present

## 2024-09-26 DIAGNOSIS — M00862 Arthritis due to other bacteria, left knee: Secondary | ICD-10-CM | POA: Diagnosis not present

## 2024-09-26 DIAGNOSIS — I480 Paroxysmal atrial fibrillation: Secondary | ICD-10-CM | POA: Diagnosis not present

## 2024-09-26 DIAGNOSIS — K7460 Unspecified cirrhosis of liver: Secondary | ICD-10-CM | POA: Diagnosis not present

## 2024-09-26 DIAGNOSIS — I5022 Chronic systolic (congestive) heart failure: Secondary | ICD-10-CM | POA: Diagnosis not present

## 2024-09-30 DIAGNOSIS — M79644 Pain in right finger(s): Secondary | ICD-10-CM | POA: Diagnosis not present

## 2024-09-30 DIAGNOSIS — I272 Pulmonary hypertension, unspecified: Secondary | ICD-10-CM | POA: Diagnosis not present

## 2024-09-30 DIAGNOSIS — R8281 Pyuria: Secondary | ICD-10-CM | POA: Diagnosis not present

## 2024-09-30 DIAGNOSIS — I890 Lymphedema, not elsewhere classified: Secondary | ICD-10-CM | POA: Diagnosis not present

## 2024-09-30 DIAGNOSIS — M00862 Arthritis due to other bacteria, left knee: Secondary | ICD-10-CM | POA: Diagnosis not present

## 2024-09-30 DIAGNOSIS — R52 Pain, unspecified: Secondary | ICD-10-CM | POA: Diagnosis not present

## 2024-10-01 DIAGNOSIS — N184 Chronic kidney disease, stage 4 (severe): Secondary | ICD-10-CM | POA: Diagnosis not present

## 2024-10-01 DIAGNOSIS — I10 Essential (primary) hypertension: Secondary | ICD-10-CM | POA: Diagnosis not present

## 2024-10-01 DIAGNOSIS — R252 Cramp and spasm: Secondary | ICD-10-CM | POA: Diagnosis not present

## 2024-10-07 ENCOUNTER — Ambulatory Visit: Admitting: Internal Medicine

## 2024-10-08 DIAGNOSIS — Z4789 Encounter for other orthopedic aftercare: Secondary | ICD-10-CM | POA: Diagnosis not present

## 2024-10-08 DIAGNOSIS — M25462 Effusion, left knee: Secondary | ICD-10-CM | POA: Diagnosis not present

## 2024-10-09 ENCOUNTER — Telehealth: Payer: Self-pay

## 2024-10-09 NOTE — Patient Outreach (Signed)
 Complex Care Management   Visit Note  10/09/2024  Name:  Katelyn Jackson MRN: [id number] DOB: 24-Apr-1946  Situation:Per review of chart patient hospitalized on 09/19/24-09/25/24 for pyogenic arthritis. Currently in [facility].   Follow Up Plan:   Will follow up post discharge.  Heddy Shutter, RN, MSN, BSN, CCM [place]  [facility], Population Health Case Manager Phone: [phone]

## 2024-10-10 DIAGNOSIS — Z950 Presence of cardiac pacemaker: Secondary | ICD-10-CM | POA: Diagnosis not present

## 2024-10-17 DIAGNOSIS — M00262 Other streptococcal arthritis, left knee: Secondary | ICD-10-CM | POA: Diagnosis not present

## 2024-10-17 DIAGNOSIS — R7881 Bacteremia: Secondary | ICD-10-CM | POA: Diagnosis not present

## 2024-10-25 ENCOUNTER — Ambulatory Visit

## 2024-11-11 ENCOUNTER — Telehealth: Payer: Self-pay

## 2024-11-25 NOTE — Patient Outreach (Signed)
 Complex Care Management   Visit Note  11/25/2024  Name:  Katelyn Jackson MRN: [id number] DOB: 22-Oct-1946   RNCM called [name] and confirmed patient remains a resident of [name],   Heddy Shutter, RN, MSN, BSN, CCM [place]  [facility], Population Health Case Manager Phone: [phone]

## 2024-12-12 ENCOUNTER — Encounter: Payer: Self-pay | Admitting: *Deleted

## 2024-12-12 ENCOUNTER — Telehealth: Payer: Self-pay | Admitting: *Deleted

## 2024-12-12 NOTE — Patient Outreach (Signed)
 Complex Care Management   Visit Note  12/12/2024  Name:  Katelyn Jackson MRN: [id number] DOB: 1946-01-30  Situation: RNCM called [name] to verify if pt still at the rehab facility. Spoke with Juliet who confirmed patient still at the facility.    Follow Up Plan:   RNCM will continue to follow-up   Franklin Honour, RN, BSN [place]/ [facility]-Population Health RN care manager Direct dial: [phone]

## 2024-12-23 ENCOUNTER — Telehealth: Payer: Self-pay
